# Patient Record
Sex: Female | Born: 1950 | Race: White | Hispanic: No | Smoking: Never smoker
Health system: Southern US, Community
[De-identification: ages and names within clinical notes are randomized; demographics above are authoritative.]

## PROBLEM LIST (undated history)

## (undated) DIAGNOSIS — M1990 Unspecified osteoarthritis, unspecified site: Secondary | ICD-10-CM

## (undated) DIAGNOSIS — I429 Cardiomyopathy, unspecified: Secondary | ICD-10-CM

## (undated) DIAGNOSIS — I5022 Chronic systolic (congestive) heart failure: Secondary | ICD-10-CM

## (undated) DIAGNOSIS — I639 Cerebral infarction, unspecified: Secondary | ICD-10-CM

## (undated) DIAGNOSIS — I10 Essential (primary) hypertension: Secondary | ICD-10-CM

## (undated) DIAGNOSIS — E119 Type 2 diabetes mellitus without complications: Secondary | ICD-10-CM

## (undated) HISTORY — PX: TONSILLECTOMY: SUR1361

## (undated) HISTORY — PX: HIP FRACTURE SURGERY: SHX118

## (undated) HISTORY — PX: PACEMAKER IMPLANT: EP1218

## (undated) HISTORY — PX: OTHER SURGICAL HISTORY: SHX169

---

## 2013-01-17 ENCOUNTER — Emergency Department (HOSPITAL_COMMUNITY)
Admission: EM | Admit: 2013-01-17 | Discharge: 2013-01-18 | Disposition: A | Discharge status: Home / Self Care | Payer: Medicaid Other | Attending: Emergency Medicine | Admitting: Emergency Medicine

## 2013-01-17 ENCOUNTER — Emergency Department (HOSPITAL_COMMUNITY): Payer: Medicaid Other

## 2013-01-17 ENCOUNTER — Encounter (HOSPITAL_COMMUNITY): Payer: Self-pay | Admitting: *Deleted

## 2013-01-17 DIAGNOSIS — I10 Essential (primary) hypertension: Secondary | ICD-10-CM

## 2013-01-17 DIAGNOSIS — E119 Type 2 diabetes mellitus without complications: Secondary | ICD-10-CM

## 2013-01-17 DIAGNOSIS — R339 Retention of urine, unspecified: Secondary | ICD-10-CM | POA: Insufficient documentation

## 2013-01-17 DIAGNOSIS — E1169 Type 2 diabetes mellitus with other specified complication: Secondary | ICD-10-CM | POA: Insufficient documentation

## 2013-01-17 DIAGNOSIS — Z8739 Personal history of other diseases of the musculoskeletal system and connective tissue: Secondary | ICD-10-CM | POA: Insufficient documentation

## 2013-01-17 DIAGNOSIS — R109 Unspecified abdominal pain: Secondary | ICD-10-CM | POA: Insufficient documentation

## 2013-01-17 DIAGNOSIS — R000 Tachycardia, unspecified: Secondary | ICD-10-CM | POA: Insufficient documentation

## 2013-01-17 DIAGNOSIS — R222 Localized swelling, mass and lump, trunk: Secondary | ICD-10-CM | POA: Insufficient documentation

## 2013-01-17 DIAGNOSIS — Z8679 Personal history of other diseases of the circulatory system: Secondary | ICD-10-CM | POA: Insufficient documentation

## 2013-01-17 DIAGNOSIS — R918 Other nonspecific abnormal finding of lung field: Secondary | ICD-10-CM

## 2013-01-17 HISTORY — DX: Type 2 diabetes mellitus without complications: E11.9

## 2013-01-17 HISTORY — DX: Essential (primary) hypertension: I10

## 2013-01-17 HISTORY — DX: Unspecified osteoarthritis, unspecified site: M19.90

## 2013-01-17 HISTORY — DX: Cardiomyopathy, unspecified: I42.9

## 2013-01-17 LAB — CBC WITH DIFFERENTIAL/PLATELET
Basophils Absolute: 0 10*3/uL (ref 0.0–0.1)
Basophils Relative: 0 % (ref 0–1)
Eosinophils Absolute: 0.1 10*3/uL (ref 0.0–0.7)
Eosinophils Relative: 0 % (ref 0–5)
HCT: 38.3 % (ref 36.0–46.0)
Hemoglobin: 13.1 g/dL (ref 12.0–15.0)
Lymphocytes Relative: 9 % — ABNORMAL LOW (ref 12–46)
Lymphs Abs: 1.7 10*3/uL (ref 0.7–4.0)
MCH: 28.4 pg (ref 26.0–34.0)
MCHC: 34.2 g/dL (ref 30.0–36.0)
MCV: 83.1 fL (ref 78.0–100.0)
Monocytes Absolute: 1.3 10*3/uL — ABNORMAL HIGH (ref 0.1–1.0)
Monocytes Relative: 7 % (ref 3–12)
Neutro Abs: 15.7 10*3/uL — ABNORMAL HIGH (ref 1.7–7.7)
Neutrophils Relative %: 84 % — ABNORMAL HIGH (ref 43–77)
Platelets: 444 10*3/uL — ABNORMAL HIGH (ref 150–400)
RBC: 4.61 MIL/uL (ref 3.87–5.11)
RDW: 13 % (ref 11.5–15.5)
WBC: 18.7 10*3/uL — ABNORMAL HIGH (ref 4.0–10.5)

## 2013-01-17 LAB — BASIC METABOLIC PANEL
BUN: 12 mg/dL (ref 6–23)
CO2: 19 mEq/L (ref 19–32)
Calcium: 9.7 mg/dL (ref 8.4–10.5)
Chloride: 98 mEq/L (ref 96–112)
Creatinine, Ser: 0.55 mg/dL (ref 0.50–1.10)
GFR calc Af Amer: >90 mL/min (ref >90)
GFR calc non Af Amer: >90 mL/min (ref >90)
Glucose, Bld: 315 mg/dL — ABNORMAL HIGH (ref 70–99)
Potassium: 3.6 mEq/L (ref 3.5–5.1)
Sodium: 135 mEq/L (ref 135–145)

## 2013-01-17 LAB — URINALYSIS, ROUTINE W REFLEX MICROSCOPIC
Bilirubin Urine: NEGATIVE
Glucose, UA: 500 mg/dL — AB
Ketones, ur: >80 mg/dL — AB
Leukocytes, UA: NEGATIVE
Nitrite: NEGATIVE
Protein, ur: 100 mg/dL — AB
Specific Gravity, Urine: 1.025 (ref 1.005–1.030)
Urobilinogen, UA: 0.2 mg/dL (ref 0.0–1.0)
pH: 5.5 (ref 5.0–8.0)

## 2013-01-17 LAB — URINE MICROSCOPIC-ADD ON

## 2013-01-17 LAB — GLUCOSE, CAPILLARY: Glucose-Capillary: 265 mg/dL — ABNORMAL HIGH (ref 70–99)

## 2013-01-17 IMAGING — CR DG CHEST 2V
2 series · 2 of 2 positions shown · non-contrast
Comparison: None.

CLINICAL DATA: Hypertension and hyperglycemia.  Urinary retention.

CHEST - 2 VIEW

[view not recorded (1 of 2)]
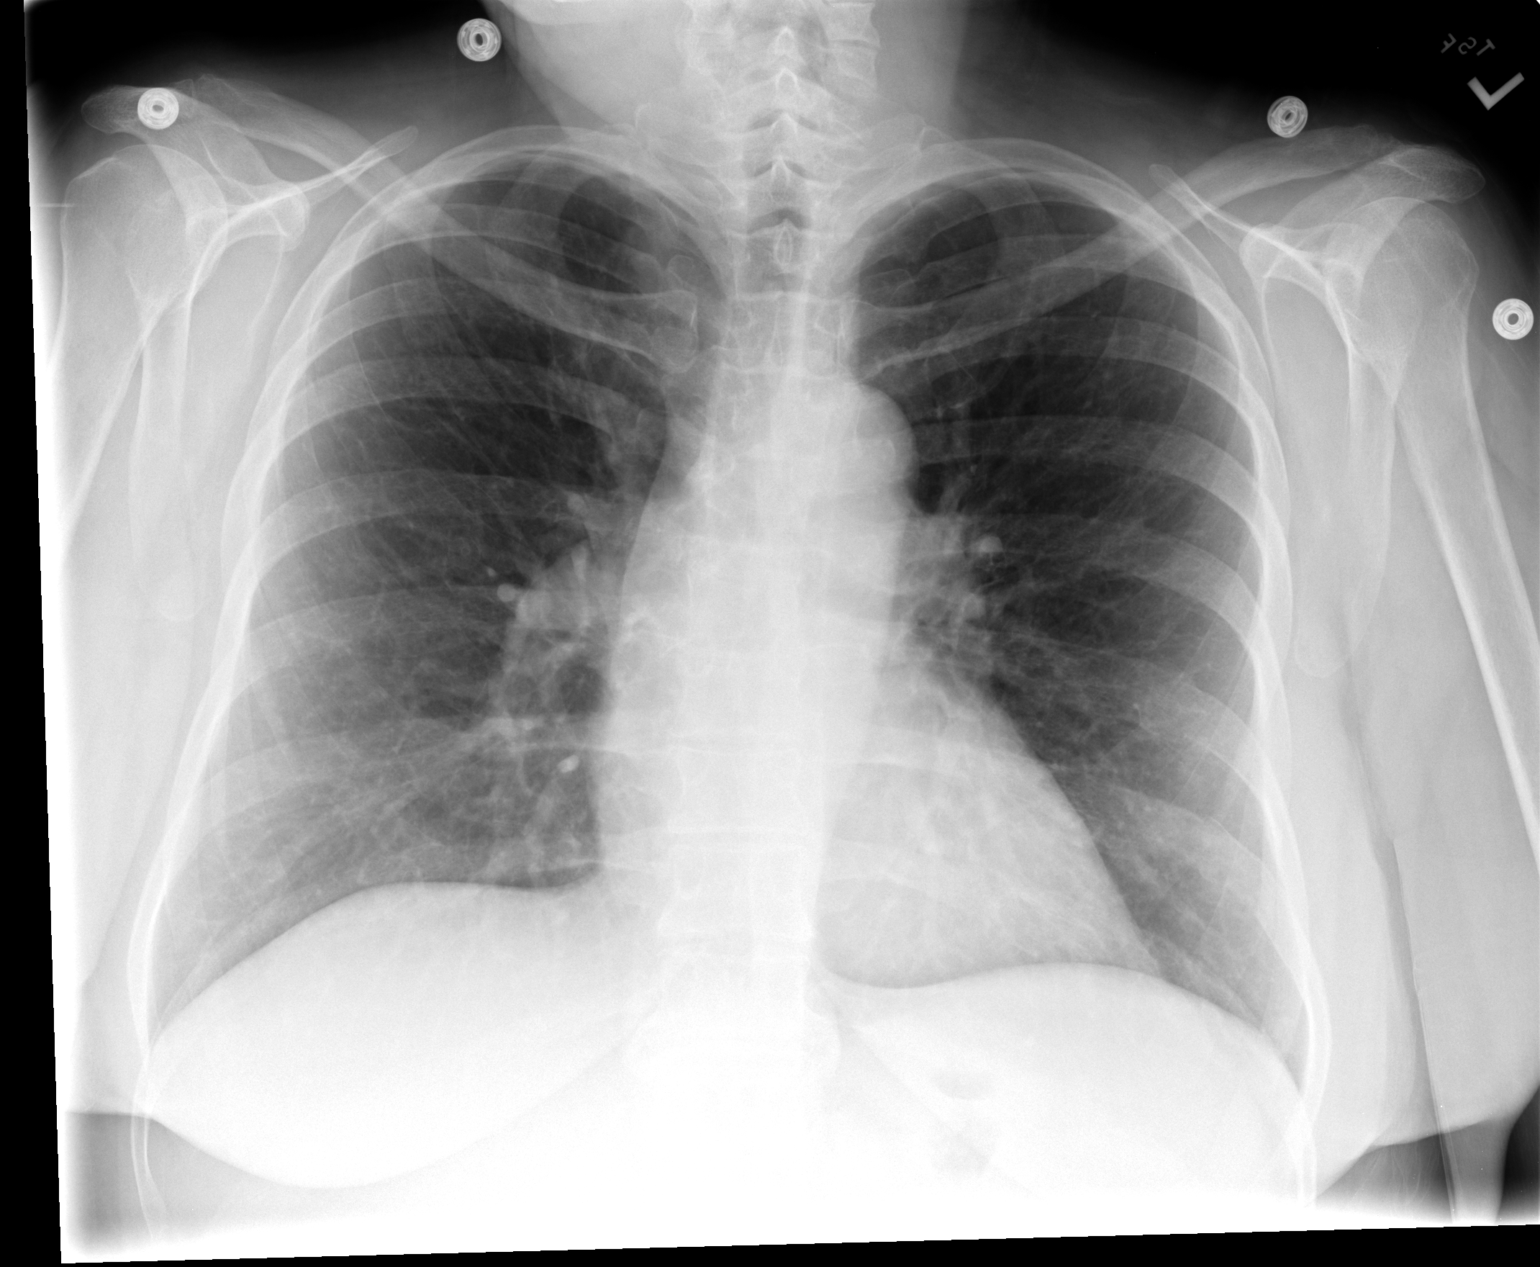

[view not recorded (2 of 2)]
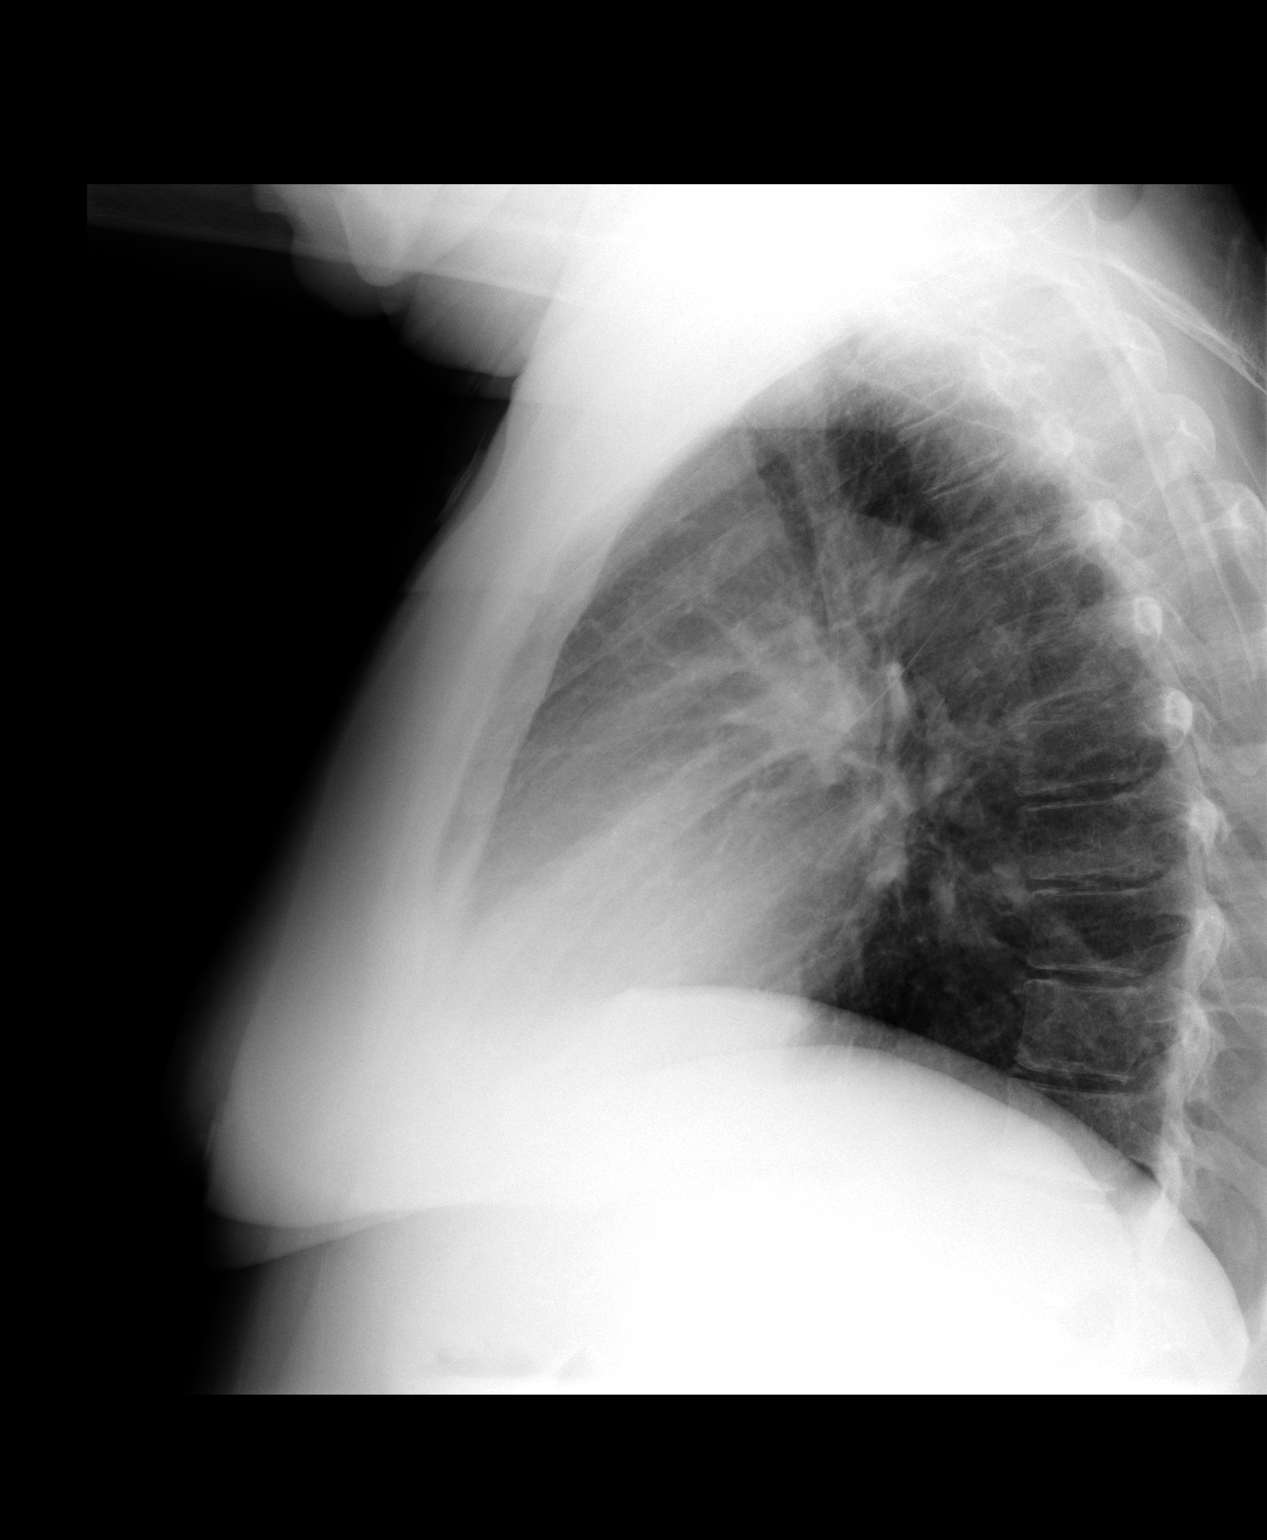

[2 of 2 positions shown; findings below may reference images not displayed]

FINDINGS: The heart size and pulmonary vascularity are normal and
the lungs are clear.  No acute osseous abnormality. Old healed left
posterior rib fractures.
IMPRESSION: No acute disease.

## 2013-01-17 IMAGING — CT CT ABD-PELV W/O CM
2 of 4 series · 15 of 46 positions shown, 17 images · non-contrast
Comparison: None.

CLINICAL DATA: Unable to urinate for 3 days.  Left flank pain.
History of diabetes.

CT ABDOMEN AND PELVIS WITHOUT CONTRAST
TECHNIQUE: Multidetector CT imaging of the abdomen and pelvis was
performed following the standard protocol without intravenous
contrast.

[Series 2: standard/full over (age)lbs 5.0 · axial · 0.73mm/px · z∈[-426,-22]mm · 12 of 97 slices shown, 14 images]
[im 8/97  soft-tissue]
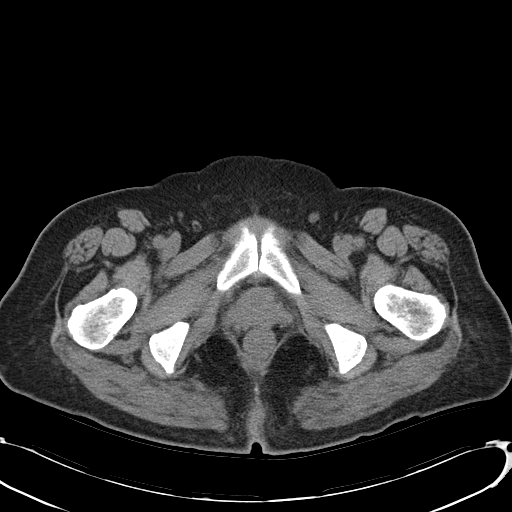
[im 8/97  bone]
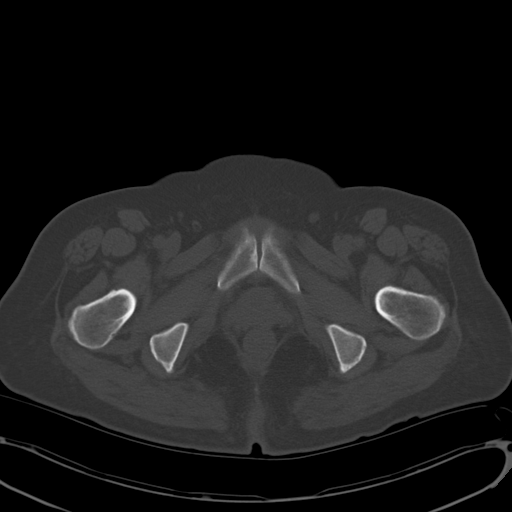
[im 15/97  soft-tissue]
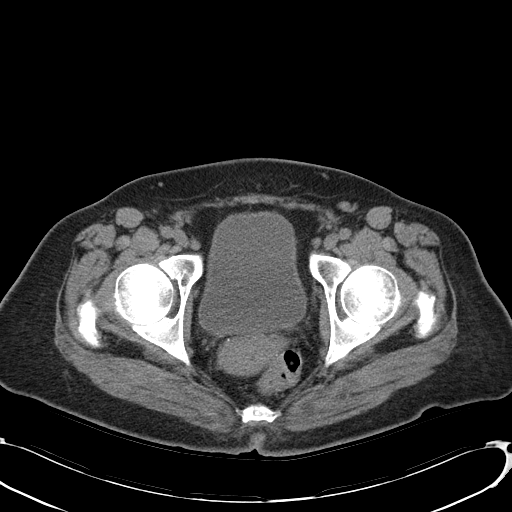
[im 23/97  soft-tissue]
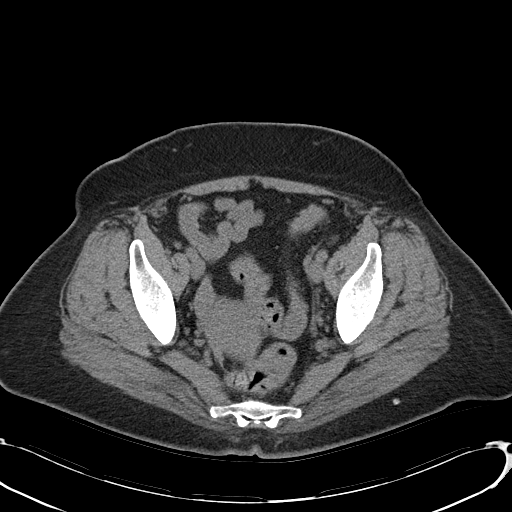
[im 30/97  soft-tissue]
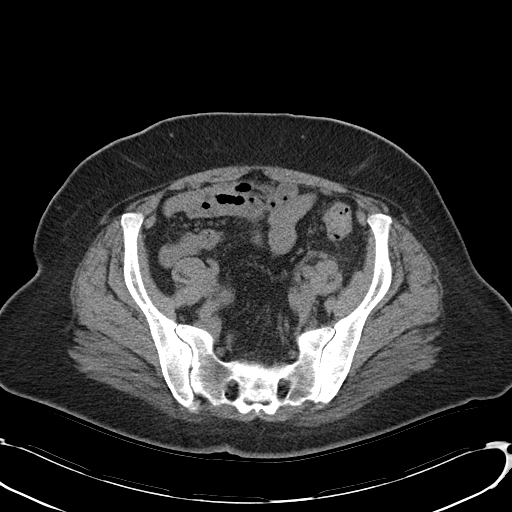
[im 37/97  soft-tissue]
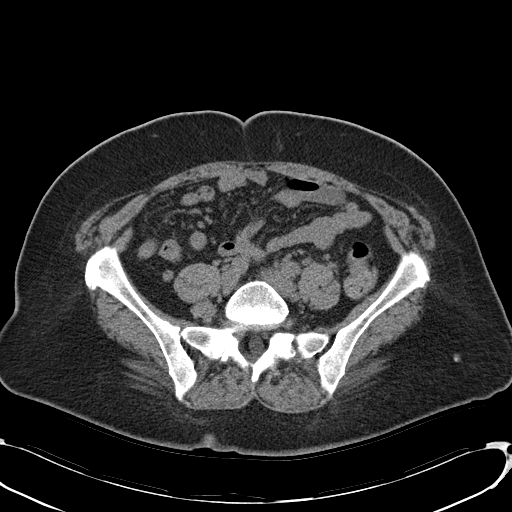
[im 45/97  soft-tissue]
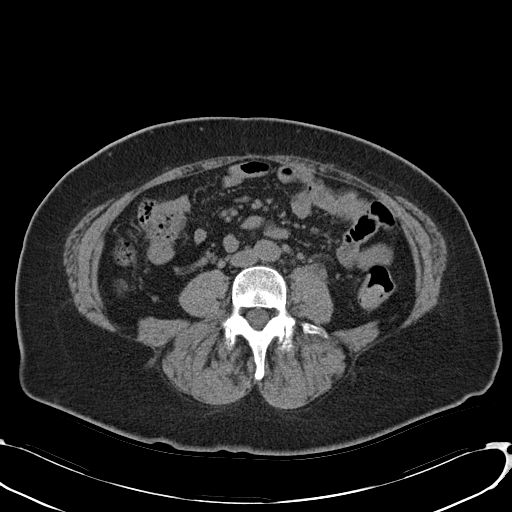
[im 52/97  soft-tissue]
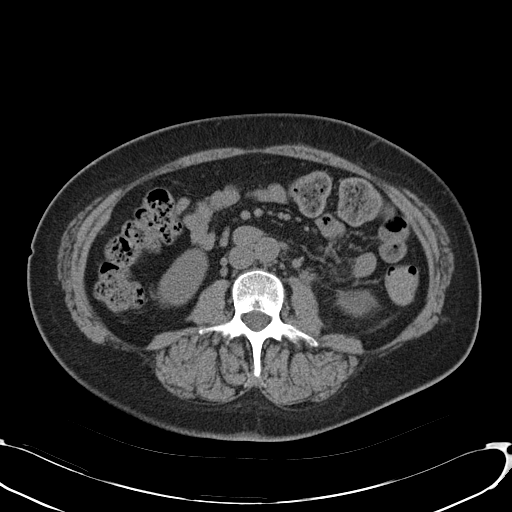
[im 60/97  soft-tissue]
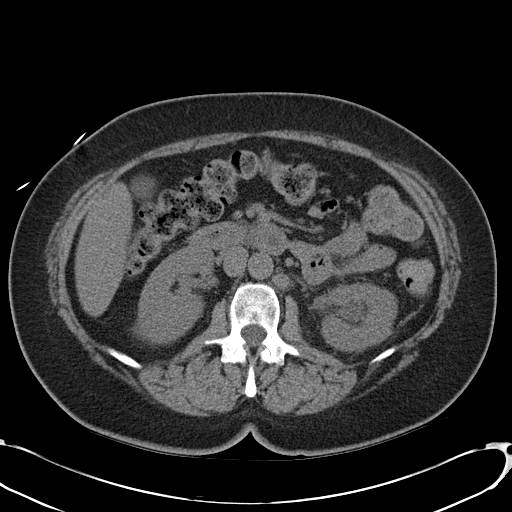
[im 67/97  soft-tissue]
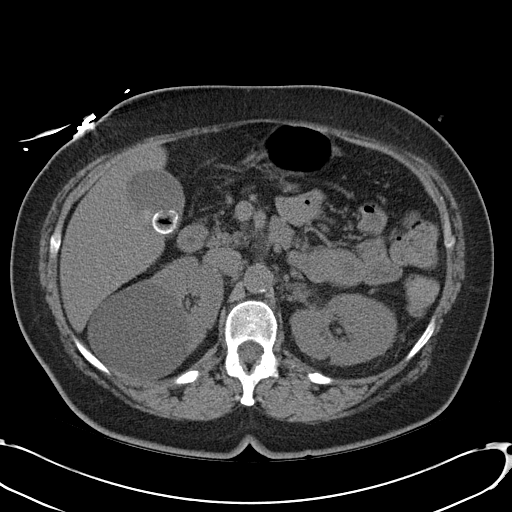
[im 67/97  bone]
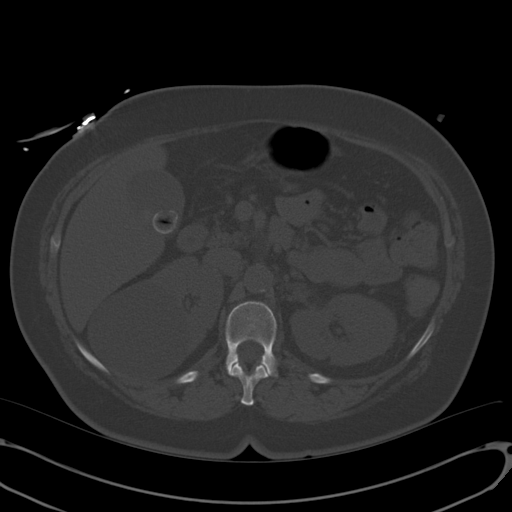
[im 74/97  soft-tissue]
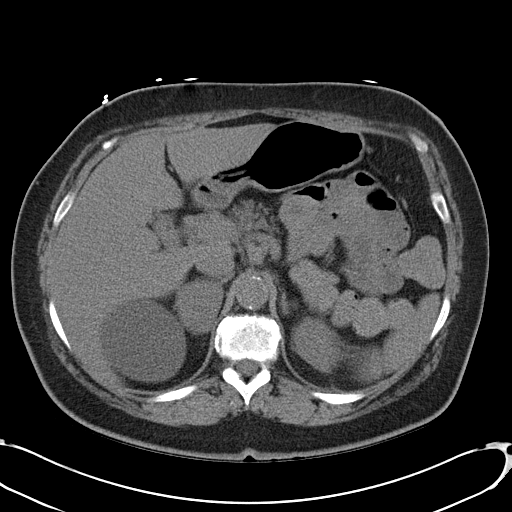
[im 82/97  soft-tissue]
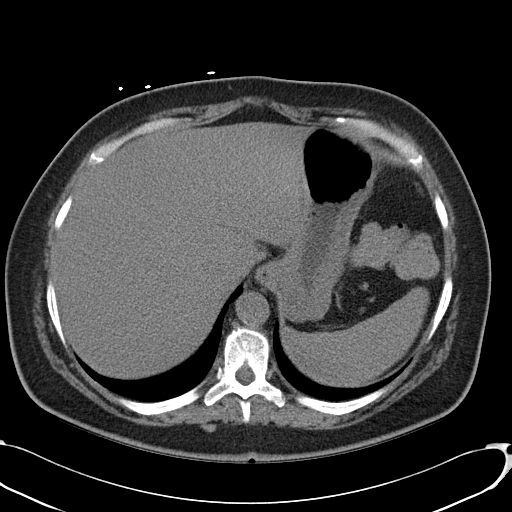
[im 89/97  soft-tissue]
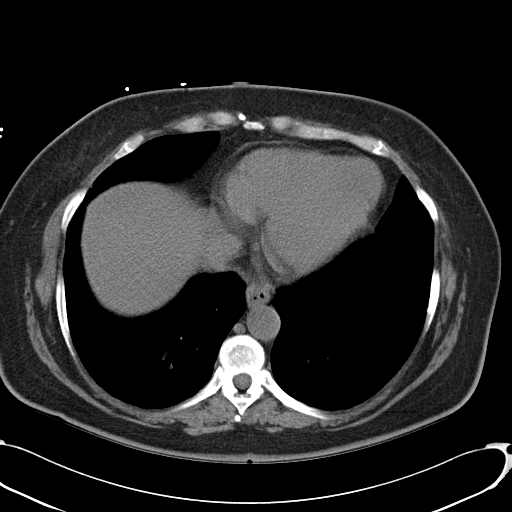

[Series 4: mpr coronal · coronal · 0.72mm/px · 3 of 87 slices shown]
[im 29/87  soft-tissue]
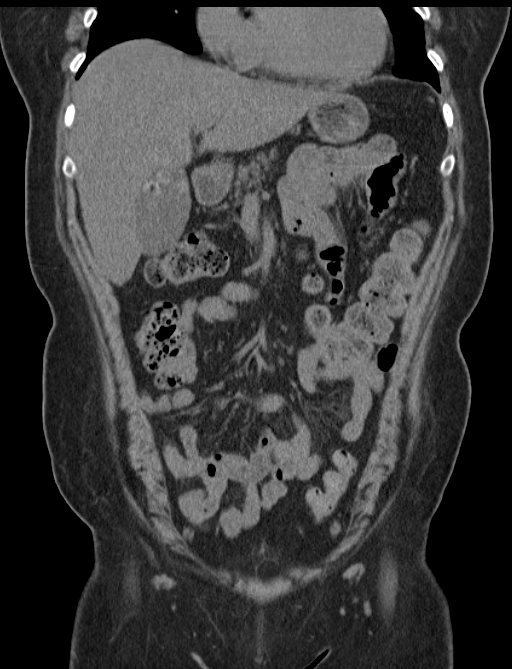
[im 39/87  soft-tissue]
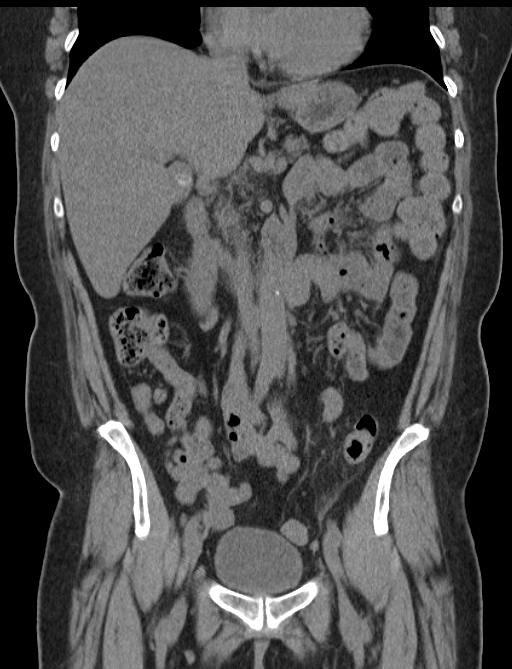
[im 48/87  soft-tissue]
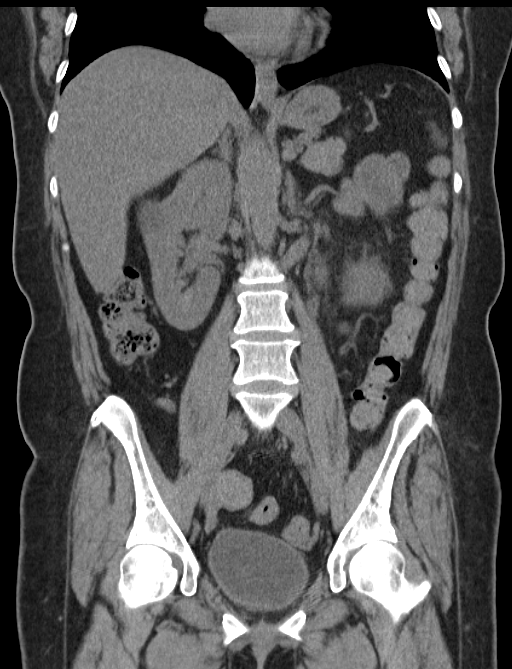

[15 of 46 positions shown; findings below may reference images not displayed]

FINDINGS: A 1.1 cm nodule is noted at the left lung base.

The liver and spleen are unremarkable in appearance.  Multiple
prominent stones are seen within the gallbladder; the gallbladder
is otherwise unremarkable in appearance.  The pancreas and adrenal
glands are unremarkable.

There is minimal apparent left-sided hydronephrosis, involving only
the lower pole of the left kidney; this could conceivably reflect a
small parapelvic cyst.  However, there is asymmetric left-sided
perinephric stranding, and stranding along the course of the left
ureter.  This may reflect a recently passed stone, as no distal
obstructing stone is identified, and no stones are seen within the
bladder.  Alternatively, this could reflect pyelonephritis and
ureteritis.

A large 8.5 cm cyst is noted arising along the lateral aspect of
the right kidney.  The right kidney is otherwise unremarkable in
appearance.  No nonobstructing renal stones are identified on
either side.

No free fluid is identified.  The small bowel is unremarkable in
appearance.  The stomach is within normal limits.  No acute
vascular abnormalities are seen.

The appendix is normal in caliber, without evidence for
appendicitis.  The colon is grossly unremarkable in appearance; a
single diverticulum is incidentally noted along the mid sigmoid
colon.

The bladder is mildly distended and grossly unremarkable in
appearance.  The uterus is within normal limits.  The ovaries are
grossly symmetric; no suspicious adnexal masses are seen.  No
inguinal lymphadenopathy is seen.

No acute osseous abnormalities are identified.  Healed left-sided
rib fractures are seen.
IMPRESSION: 1.  Minimal apparent left-sided hydronephrosis, involving only the
lower pole of the left kidney; this could reflect a small
parapelvic cyst.  However, there is asymmetric left-sided
perinephric stranding, and stranding along the course of the left
ureter.  This may reflect a recently passed stone, or possibly
pyelonephritis and ureteritis.  No distal obstructing stones are
identified.
2.  Large 8.5 cm right renal cyst seen.
3.  Cholelithiasis noted; gallbladder otherwise unremarkable in
appearance.
4.  1.1 cm nodule at the left lung base; malignancy cannot be
excluded.  PET / CT would be helpful for further evaluation.

## 2013-01-17 IMAGING — CT CT CHEST W/ CM
2 of 3 series · 15 of 36 positions shown, 18 images · IV contrast (Omnipaque 300)
Comparison: Abdominal CT [DATE]

CLINICAL DATA: Evaluate left lung nodule.

CT CHEST WITH CONTRAST
TECHNIQUE: Multidetector CT imaging of the chest was performed
following the standard protocol during bolus administration of
intravenous contrast.
Contrast: 80mL OMNIPAQUE IOHEXOL 300 MG/ML  SOLN

[Series 2: chestroutine 5.0 b40f · axial · 0.69mm/px · z∈[-264,-34]mm · 12 of 55 slices shown, 15 images]
[im 5/55  mediastinal]
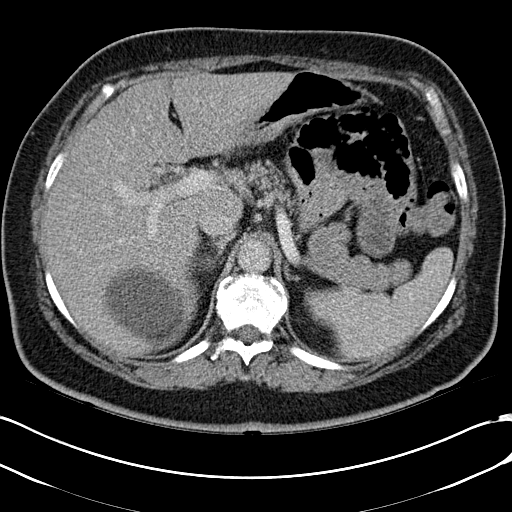
[im 5/55  lung]
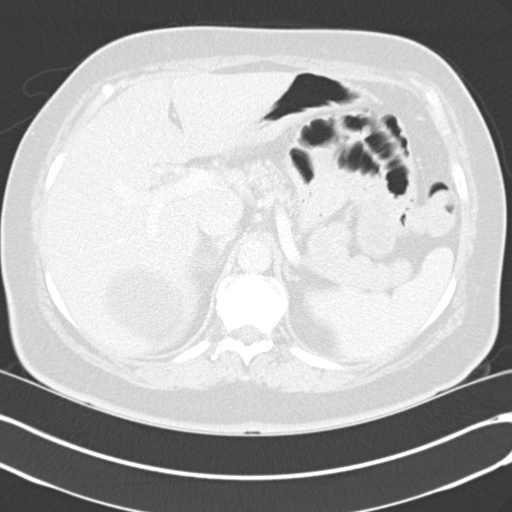
[im 9/55  lung]
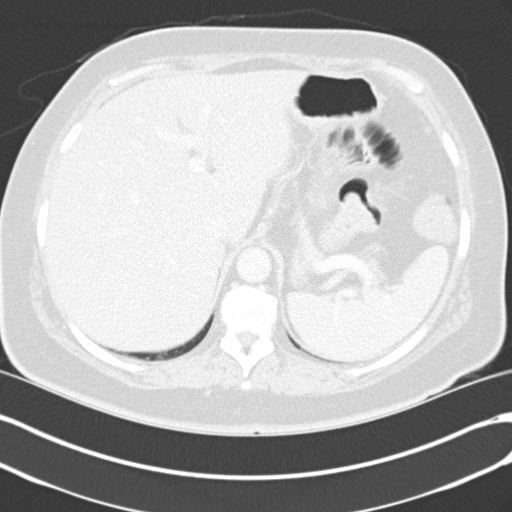
[im 13/55  lung]
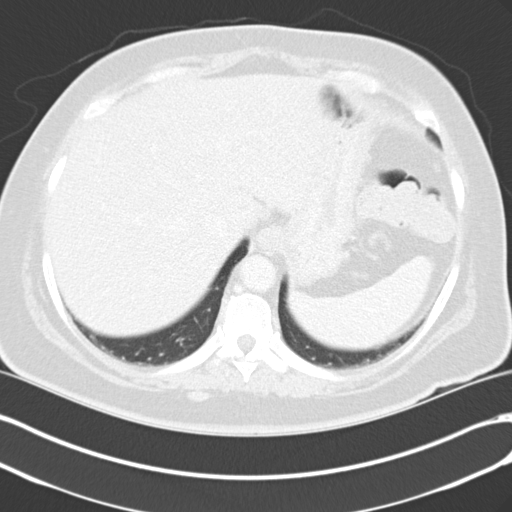
[im 17/55  lung]
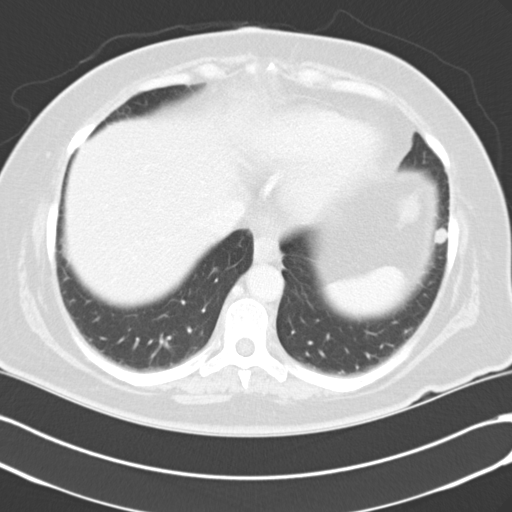
[im 21/55  mediastinal]
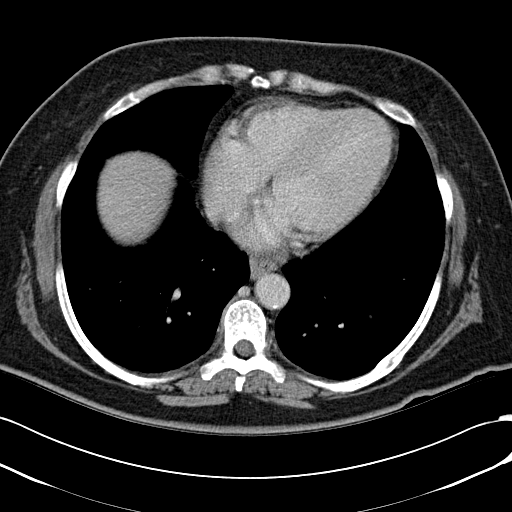
[im 21/55  lung]
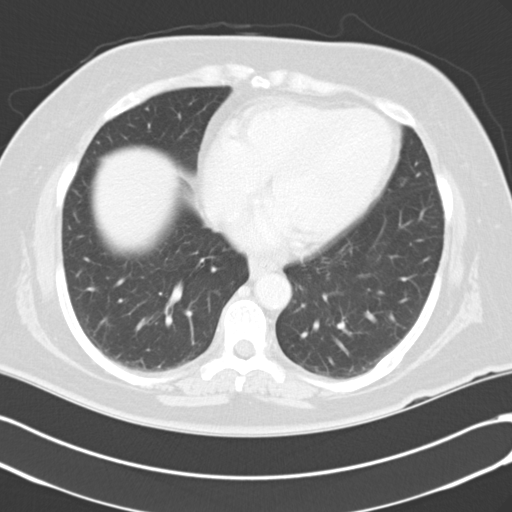
[im 25/55  lung]
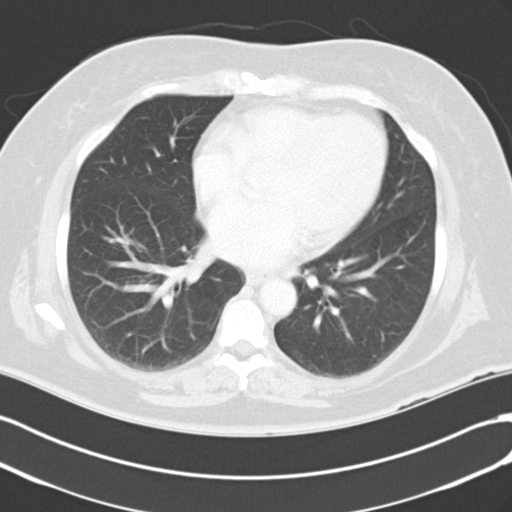
[im 31/55  lung]
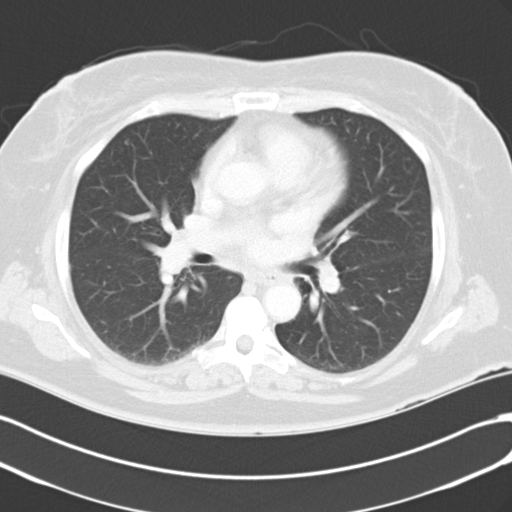
[im 35/55  lung]
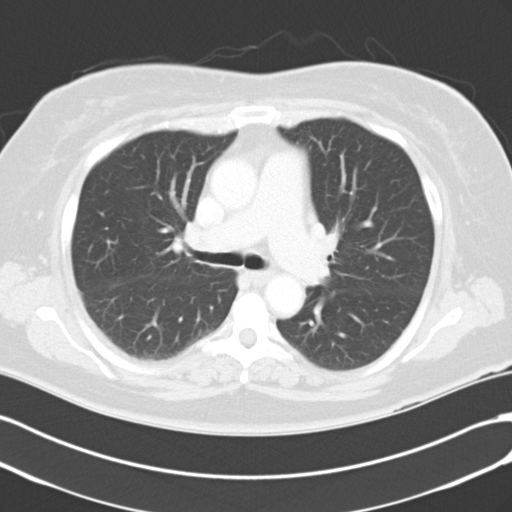
[im 39/55  mediastinal]
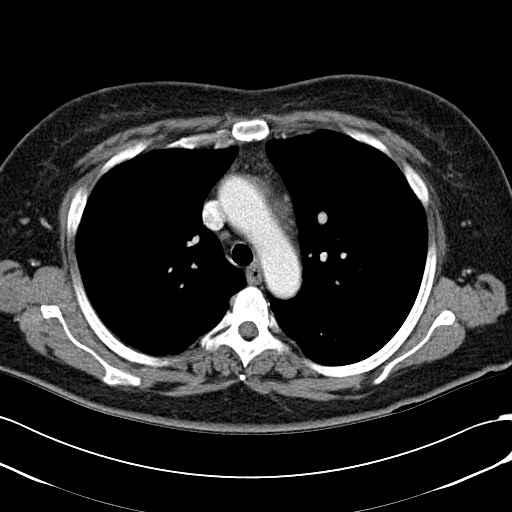
[im 39/55  lung]
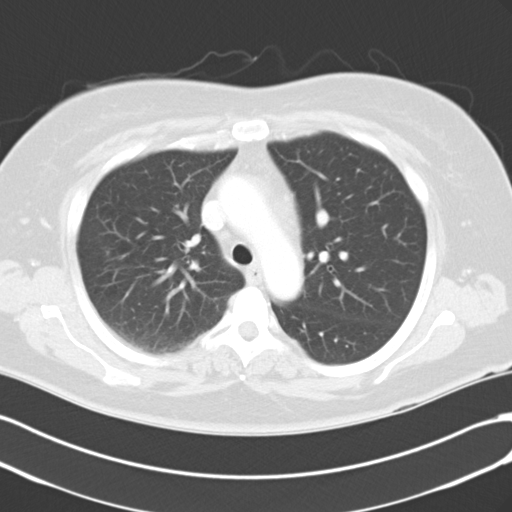
[im 43/55  lung]
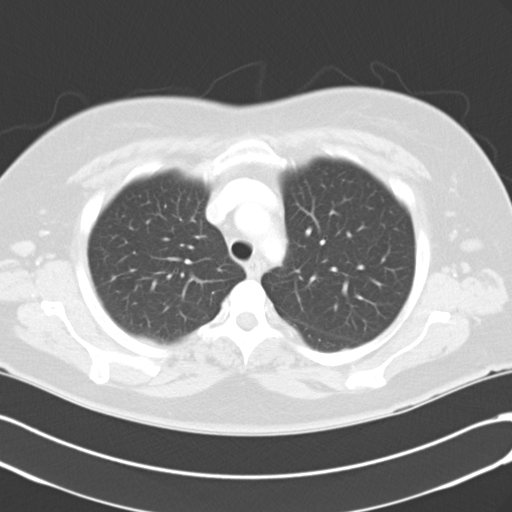
[im 47/55  lung]
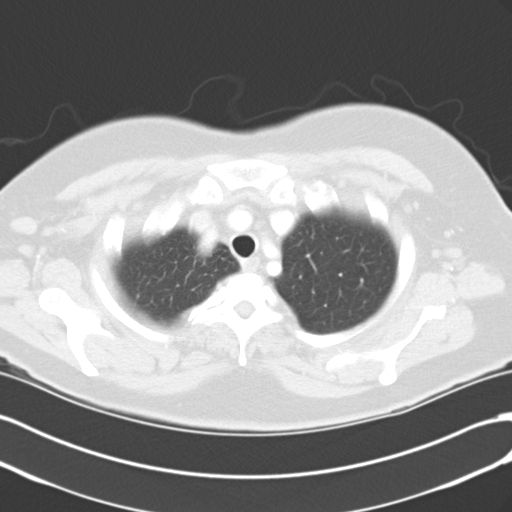
[im 51/55  lung]
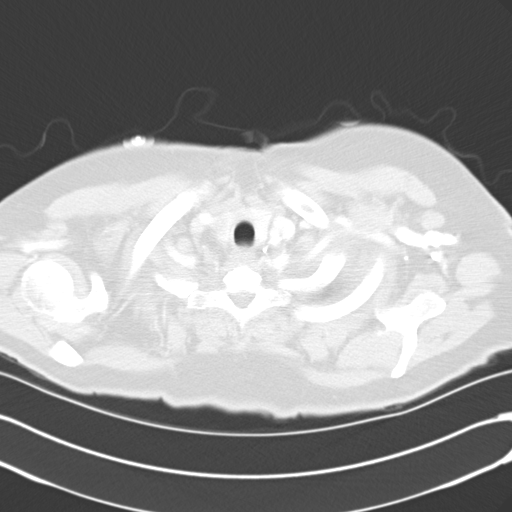

[Series 6: mpr coronal chest 3mm · coronal · 0.55mm/px · 3 of 91 slices shown]
[im 19/91  lung]
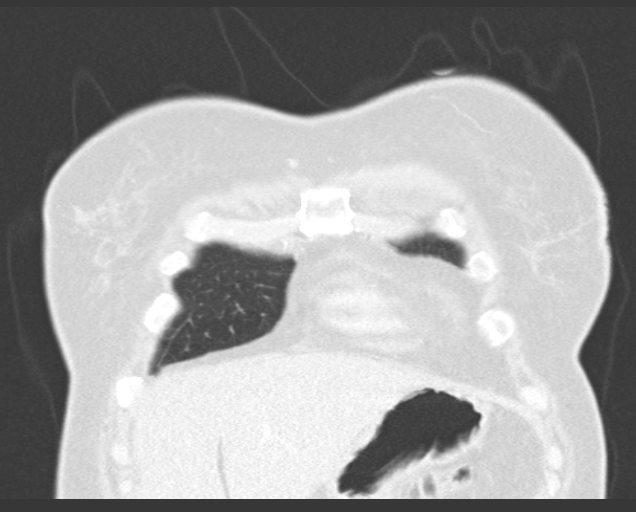
[im 37/91  lung]
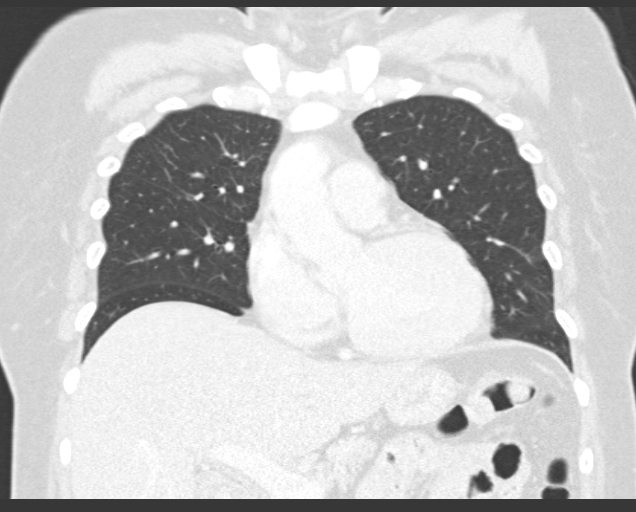
[im 55/91  lung]
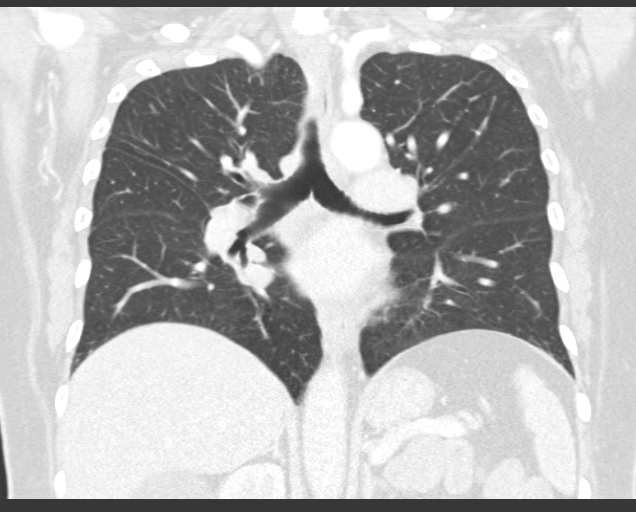

[15 of 36 positions shown; findings below may reference images not displayed]

FINDINGS: There are small lymph nodes in the sub pectoralis region
bilaterally.  There is also prominent lymph nodes in the axilla
bilaterally.  Index lesion on the right side measures 1.7 cm on
sequence 2, image 9.  No significant mediastinal lymphadenopathy
but there may be mild bilateral hilar lymphadenopathy.  Question a
right hilar lymph node, measuring 1 cm in the short axis, on image
27.  No significant pericardial or pleural fluid.

There are gallstones.  Again noted is a large right renal cyst.

The trachea and the mainstem bronchi are patent.  There is a 1.1 cm
noncalcified nodule at the left lung base.  There is a 5 mm nodule
in the superior segment of the left lower lobe on sequence 3, image
15.  There are two adjacent nodules in the left upper lobe on image
16, largest measures 5 mm.  Pleural-based nodule along the right
major fissure, measures approximately 5 mm.  Small nodule in the
right middle lobe on image 25.  Small pleural-based nodule at the
right lung base on image 37.  There are old left posterior eighth
and ninth rib fractures.
IMPRESSION: There are bilateral lung nodules.  The largest is a 1.1 cm nodule
at the left lung base.

Prominent bilateral axillary lymph nodes and lymph nodes within the
sub pectoralis region.  There is also concern for mild hilar
lymphadenopathy bilaterally.  These findings are nonspecific but a
neoplastic process cannot be excluded.

Recommend further evaluation with a PET-CT.  If multidisciplinary
follow up management is desired, this is available in the [REDACTED] through the [REDACTED] [DATE]-
[2Q].

Cholelithiasis.

Right kidney cyst.

These results were called by telephone on [DATE] at [DATE] p.m.
to Dr. YAMIN, who verbally acknowledged these results.

## 2013-01-17 MED ORDER — CIPROFLOXACIN HCL 500 MG PO TABS: 500.0000 mg | ORAL_TABLET | Freq: Two times a day (BID) | ORAL | Status: DC

## 2013-01-17 MED ORDER — LISINOPRIL-HYDROCHLOROTHIAZIDE 20-12.5 MG PO TABS: 1.0000 | ORAL_TABLET | Freq: Every day | ORAL | Status: DC

## 2013-01-17 MED ORDER — SODIUM CHLORIDE 0.9 % IV BOLUS (SEPSIS)
1000.0000 mL | Freq: Once | INTRAVENOUS | Status: AC
  Administered 2013-01-17: 1000 mL via INTRAVENOUS

## 2013-01-17 MED ORDER — METFORMIN HCL 500 MG PO TABS: 500.0000 mg | ORAL_TABLET | Freq: Two times a day (BID) | ORAL | Status: DC

## 2013-01-17 MED ORDER — CLONIDINE HCL 0.2 MG PO TABS
0.2000 mg | ORAL_TABLET | Freq: Once | ORAL | Status: AC
  Administered 2013-01-17: 0.2 mg via ORAL
  Filled 2013-01-17: qty 1

## 2013-01-17 MED ORDER — IOHEXOL 300 MG/ML  SOLN
80.0000 mL | Freq: Once | INTRAMUSCULAR | Status: AC | PRN
  Administered 2013-01-17: 80 mL via INTRAVENOUS

## 2013-01-17 MED ORDER — DEXTROSE 5 % IV SOLN
1.0000 g | Freq: Once | INTRAVENOUS | Status: AC
  Administered 2013-01-17: 1 g via INTRAVENOUS
  Filled 2013-01-17: qty 10

## 2013-01-17 NOTE — ED Notes (Signed)
Patient transported to CT 

## 2013-01-17 NOTE — ED Notes (Signed)
PT sent from [place] Dept. For HTN of 205/121 and CBG reading "high". Pt states lower back pain and went five days without urinating. Pt states "only dribbling urine now"

## 2013-01-17 NOTE — ED Provider Notes (Signed)
History  This chart was scribed for Nat Christen, MD by Roe Coombs, ED Scribe. The patient was seen in room APA14/AP[facility]14. Patient's care was started at 1843.   CSN: [id number]  Arrival date & time 01/17/13  [id number]   First MD Initiated Contact with Patient 01/17/13 1843      Chief Complaint  Patient presents with  . Hypertension  . Hyperglycemia  . Urinary Retention    The history is provided by the patient. No language interpreter was used.    HPI Comments: Tracey Bryant is a 62 y.o. female with history of diabetes and hypertension who presents to the Emergency Department complaining of urinary retention and constant, non-radiating, moderate left flank pain onset 1 week ago. Patient says that she has "dribbled," but she has not had a normal urine void since for the past week. She states that over the past two days, the flank pain has become gradually more severe. She also states that both her blood pressure and blood sugar have been high. Patient was sent here from [facility] Department with BP measurement of 205/121. Patient hasn't taken bp or diabetes medications for the past several years due to lack of income. Patient is unemployed. She states that her sister has tried to help out with medical costs, but she is also unemployed.   Past Medical History  Diagnosis Date  . Diabetes mellitus without complication   . Hypertension   . Arthritis   . Cardiomyopathy     Past Surgical History  Procedure Date  . Breast tumor     No family history on file.  History  Substance Use Topics  . Smoking status: Never Smoker   . Smokeless tobacco: Not on file  . Alcohol Use: No    OB History    Grav Para Term Preterm Abortions TAB SAB Ect Mult Living                  Review of Systems A complete 10 system review of systems was obtained and all systems are negative except as noted in the HPI and PMH.   Allergies  Review of patient's allergies indicates no known  allergies.  Home Medications  No current outpatient prescriptions on file.  Triage Vitals: BP 226/118  Pulse 129  Temp 98.1 F (36.7 C) (Oral)  Resp 20  Ht 5' 5.75" (1.67 m)  Wt 169 lb (76.658 kg)  BMI 27.49 kg/m2  SpO2 99%  Physical Exam  Nursing note and vitals reviewed. Constitutional: She is oriented to person, place, and time. She appears well-developed and well-nourished.       Appears dehydrated. Hypertensive.  HENT:  Head: Normocephalic and atraumatic.  Eyes: Conjunctivae normal and EOM are normal. Pupils are equal, round, and reactive to light.  Neck: Normal range of motion. Neck supple.  Cardiovascular: Regular rhythm and normal heart sounds.  Tachycardia present.   Pulmonary/Chest: Effort normal and breath sounds normal.  Abdominal: Soft. Bowel sounds are normal.  Musculoskeletal: Normal range of motion.  Neurological: She is alert and oriented to person, place, and time.  Skin: Skin is warm and dry.  Psychiatric: She has a normal mood and affect.    ED Course  Procedures (including critical care time) DIAGNOSTIC STUDIES: Oxygen Saturation is 99% on room air, normal by my interpretation.    COORDINATION OF CARE: 7:04 PM- Will give IV fluids and clonidine. Will order chest x-ray, CT of abdomen and other labs. Patient informed of current plan for  treatment and evaluation and agrees with plan at this time.   Results for orders placed during the hospital encounter of 01/17/13  GLUCOSE, CAPILLARY      Component Value Range   Glucose-Capillary 265 (*) 70 - 99 mg/dL  CBC WITH DIFFERENTIAL      Component Value Range   WBC 18.7 (*) 4.0 - 10.5 K/uL   RBC 4.61  3.87 - 5.11 MIL/uL   Hemoglobin 13.1  12.0 - 15.0 g/dL   HCT 38.3  36.0 - 46.0 %   MCV 83.1  78.0 - 100.0 fL   MCH 28.4  26.0 - 34.0 pg   MCHC 34.2  30.0 - 36.0 g/dL   RDW 13.0  11.5 - 15.5 %   Platelets 444 (*) 150 - 400 K/uL   Neutrophils Relative 84 (*) 43 - 77 %   Neutro Abs 15.7 (*) 1.7 - 7.7 K/uL     Lymphocytes Relative 9 (*) 12 - 46 %   Lymphs Abs 1.7  0.7 - 4.0 K/uL   Monocytes Relative 7  3 - 12 %   Monocytes Absolute 1.3 (*) 0.1 - 1.0 K/uL   Eosinophils Relative 0  0 - 5 %   Eosinophils Absolute 0.1  0.0 - 0.7 K/uL   Basophils Relative 0  0 - 1 %   Basophils Absolute 0.0  0.0 - 0.1 K/uL  BASIC METABOLIC PANEL      Component Value Range   Sodium 135  135 - 145 mEq/L   Potassium 3.6  3.5 - 5.1 mEq/L   Chloride 98  96 - 112 mEq/L   CO2 19  19 - 32 mEq/L   Glucose, Bld 315 (*) 70 - 99 mg/dL   BUN 12  6 - 23 mg/dL   Creatinine, Ser 0.55  0.50 - 1.10 mg/dL   Calcium 9.7  8.4 - 10.5 mg/dL   GFR calc non Af Amer >90  >90 mL/min   GFR calc Af Amer >90  >90 mL/min  URINALYSIS, ROUTINE W REFLEX MICROSCOPIC      Component Value Range   Color, Urine YELLOW  YELLOW   APPearance CLEAR  CLEAR   Specific Gravity, Urine 1.025  1.005 - 1.030   pH 5.5  5.0 - 8.0   Glucose, UA 500 (*) NEGATIVE mg/dL   Hgb urine dipstick MODERATE (*) NEGATIVE   Bilirubin Urine NEGATIVE  NEGATIVE   Ketones, ur >80 (*) NEGATIVE mg/dL   Protein, ur 100 (*) NEGATIVE mg/dL   Urobilinogen, UA 0.2  0.0 - 1.0 mg/dL   Nitrite NEGATIVE  NEGATIVE   Leukocytes, UA NEGATIVE  NEGATIVE  URINE MICROSCOPIC-ADD ON      Component Value Range   WBC, UA 11-20  <3 WBC/hpf   RBC / HPF 11-20  <3 RBC/hpf   Bacteria, UA FEW (*) RARE     Ct Abdomen Pelvis Wo Contrast  01/17/2013  *RADIOLOGY REPORT*  Clinical Data: Unable to urinate for 3 days.  Left flank pain. History of diabetes.  CT ABDOMEN AND PELVIS WITHOUT CONTRAST  Technique:  Multidetector CT imaging of the abdomen and pelvis was performed following the standard protocol without intravenous contrast.  Comparison: None.  Findings: A 1.1 cm nodule is noted at the left lung base.  The liver and spleen are unremarkable in appearance.  Multiple prominent stones are seen within the gallbladder; the gallbladder is otherwise unremarkable in appearance.  The pancreas and  adrenal glands are unremarkable.  There is minimal apparent left-sided  hydronephrosis, involving only the lower pole of the left kidney; this could conceivably reflect a small parapelvic cyst.  However, there is asymmetric left-sided perinephric stranding, and stranding along the course of the left ureter.  This may reflect a recently passed stone, as no distal obstructing stone is identified, and no stones are seen within the bladder.  Alternatively, this could reflect pyelonephritis and ureteritis.  A large 8.5 cm cyst is noted arising along the lateral aspect of the right kidney.  The right kidney is otherwise unremarkable in appearance.  No nonobstructing renal stones are identified on either side.  No free fluid is identified.  The small bowel is unremarkable in appearance.  The stomach is within normal limits.  No acute vascular abnormalities are seen.  The appendix is normal in caliber, without evidence for appendicitis.  The colon is grossly unremarkable in appearance; a single diverticulum is incidentally noted along the mid sigmoid colon.  The bladder is mildly distended and grossly unremarkable in appearance.  The uterus is within normal limits.  The ovaries are grossly symmetric; no suspicious adnexal masses are seen.  No inguinal lymphadenopathy is seen.  No acute osseous abnormalities are identified.  Healed left-sided rib fractures are seen.  IMPRESSION:  1.  Minimal apparent left-sided hydronephrosis, involving only the lower pole of the left kidney; this could reflect a small parapelvic cyst.  However, there is asymmetric left-sided perinephric stranding, and stranding along the course of the left ureter.  This may reflect a recently passed stone, or possibly pyelonephritis and ureteritis.  No distal obstructing stones are identified. 2.  Large 8.5 cm right renal cyst seen. 3.  Cholelithiasis noted; gallbladder otherwise unremarkable in appearance. 4.  1.1 cm nodule at the left lung base; malignancy  cannot be excluded.  PET / CT would be helpful for further evaluation.   Original Report Authenticated By: Santa Lighter, M.D.    Dg Chest 2 View  01/17/2013  *RADIOLOGY REPORT*  Clinical Data: Hypertension and hyperglycemia.  Urinary retention.  CHEST - 2 VIEW  Comparison: None.  Findings: The heart size and pulmonary vascularity are normal and the lungs are clear.  No acute osseous abnormality. Old healed left posterior rib fractures.  IMPRESSION: No acute disease.   Original Report Authenticated By: Lorriane Shire, M.D.      No diagnosis found. CRITICAL CARE Performed by: Nat Christen  ?  Total critical care time: 30  Critical care time was exclusive of separately billable procedures and treating other patients.  Critical care was necessary to treat or prevent imminent or life-threatening deterioration.  Critical care was time spent personally by me on the following activities: development of treatment plan with patient and/or surrogate as well as nursing, discussions with consultants, evaluation of patient's response to treatment, examination of patient, obtaining history from patient or surrogate, ordering and performing treatments and interventions, ordering and review of laboratory studies, ordering and review of radiographic studies, pulse oximetry and re-evaluation of patient's condition.   MDM  Patient feeling much better after IV fluids, IV antibiotics.   We'll send home with  prescriptions for metformin 500 mg twice a day #60 with 1 refill, lisinopril HCT 20/12.5 one daily #30 with one refill, and Cipro 500 mg twice a day for one week.  CT chest is worrisome for a nodule and adenopathy. Phone number given for the thoracic clinic in [facility] system. Patient will call tomorrow for followup. She understands if she gets any roadblocks that the staff at [facility]  will help make the appointment.  Patient also understands this could signify cancer and that followup is  mandatory      I personally performed the services described in this documentation, which was scribed in my presence. The recorded information has been reviewed and is accurate.    Nat Christen, MD 01/17/13 [phone]

## 2013-01-18 MED ORDER — CLONIDINE HCL 0.1 MG PO TABS
ORAL_TABLET | ORAL | Status: AC
  Administered 2013-01-18: 0.1 mg
  Filled 2013-01-18: qty 1

## 2013-01-18 MED ORDER — CLONIDINE HCL 0.1 MG/24HR TD PTWK: 0.1000 mg | MEDICATED_PATCH | Freq: Once | TRANSDERMAL | Status: DC

## 2013-01-20 LAB — URINE CULTURE: Colony Count: >=100000

## 2013-01-21 NOTE — ED Notes (Signed)
+  Urine. Patient treated with Cipro. Sensitive to same. Per protocol MD. °

## 2013-01-31 ENCOUNTER — Other Ambulatory Visit (HOSPITAL_COMMUNITY): Payer: Self-pay | Admitting: Family Medicine

## 2013-01-31 DIAGNOSIS — R911 Solitary pulmonary nodule: Secondary | ICD-10-CM

## 2013-02-06 ENCOUNTER — Encounter (HOSPITAL_COMMUNITY)
Admission: RE | Admit: 2013-02-06 | Discharge: 2013-02-06 | Disposition: A | Discharge status: Home / Self Care | Payer: Medicaid Other | Source: Ambulatory Visit | Attending: Family Medicine | Admitting: Family Medicine

## 2013-02-06 DIAGNOSIS — R911 Solitary pulmonary nodule: Secondary | ICD-10-CM | POA: Insufficient documentation

## 2013-02-06 LAB — GLUCOSE, CAPILLARY: Glucose-Capillary: 327 mg/dL — ABNORMAL HIGH (ref 70–99)

## 2013-02-06 IMAGING — CT NM PET TUM IMG INITIAL (PI) SKULL BASE T - THIGH
6 series · 25 of 25 positions shown · IV contrast (350 OM)
Comparison: CT chest abdomen pelvis dated [DATE]

CLINICAL DATA: Initial treatment strategy for left lower lobe lung
nodule.

NUCLEAR MEDICINE PET SKULL BASE TO THIGH
Fasting Blood Glucose:  327
TECHNIQUE: 12.4 mCi F-18 FDG was injected intravenously. CT data
was obtained and used for attenuation correction and anatomic
localization only.  (This was not acquired as a diagnostic CT
examination.) Additional exam technical data entered on
technologist worksheet.

[Series 1: pet ac · axial · 3.3mm · 4.69mm/px · z∈[-872,-2]mm · 5 of 267 slices shown]
[im 1/267]
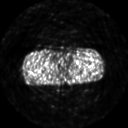
[im 67/267]
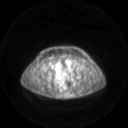
[im 134/267]
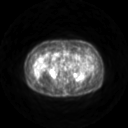
[im 200/267]
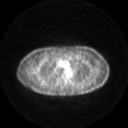
[im 267/267]
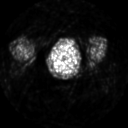

[Series 2: pet nac · axial · 3.3mm · 4.69mm/px · z∈[-872,-2]mm · 6 of 267 slices shown]
[im 1/267]
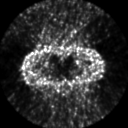
[im 54/267]
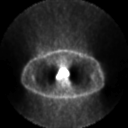
[im 107/267]
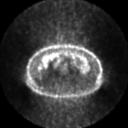
[im 160/267]
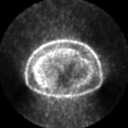
[im 213/267]
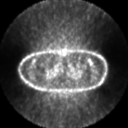
[im 267/267]
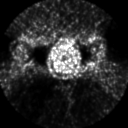

[Series 2: ct images · axial · 3.8mm · 0.98mm/px · z∈[-872,-2]mm · 6 of 267 slices shown]
[im 1/267]
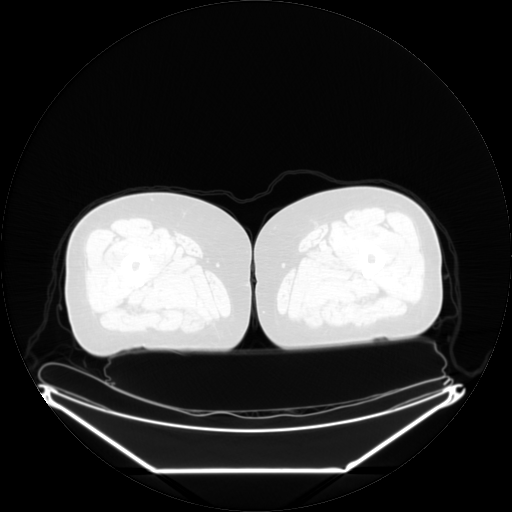
[im 54/267]
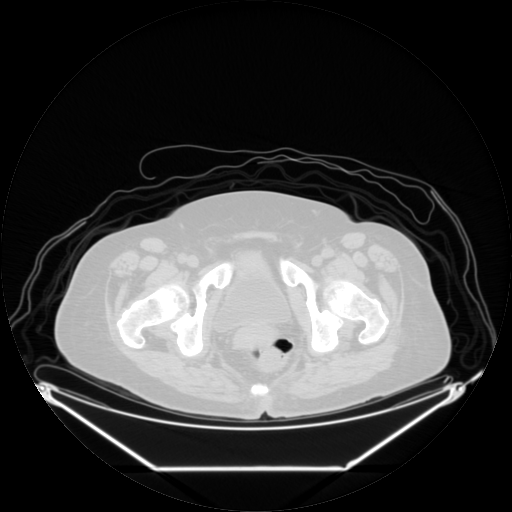
[im 107/267]
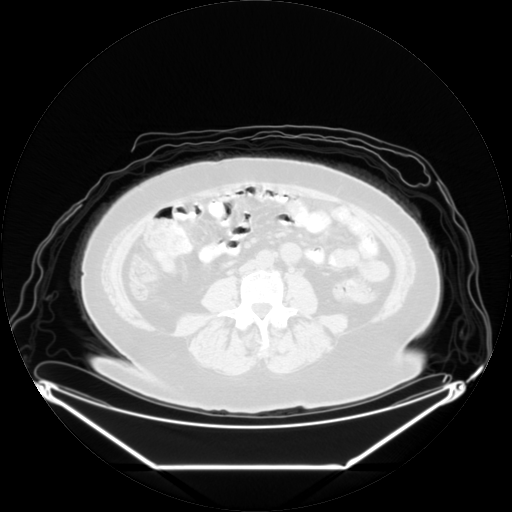
[im 160/267]
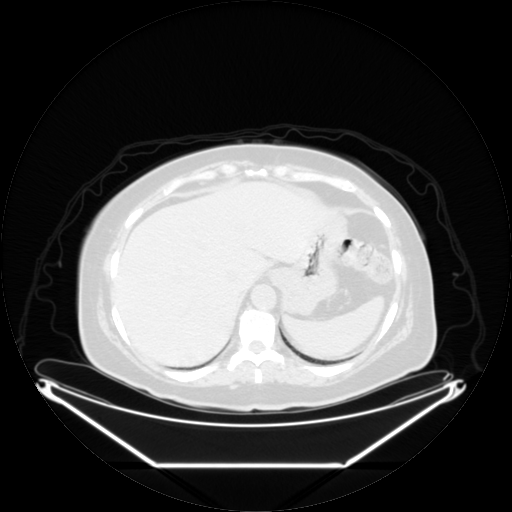
[im 213/267]
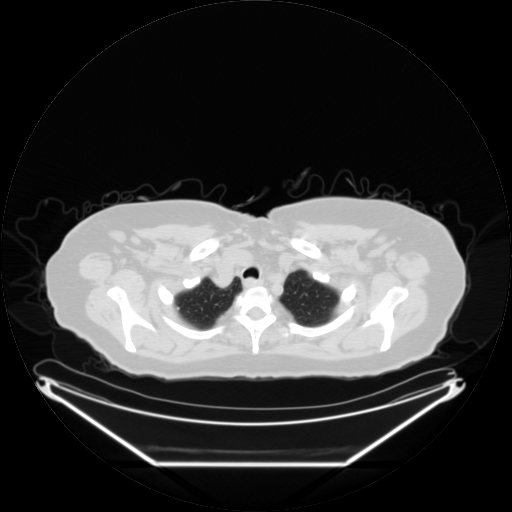
[im 267/267  brain]
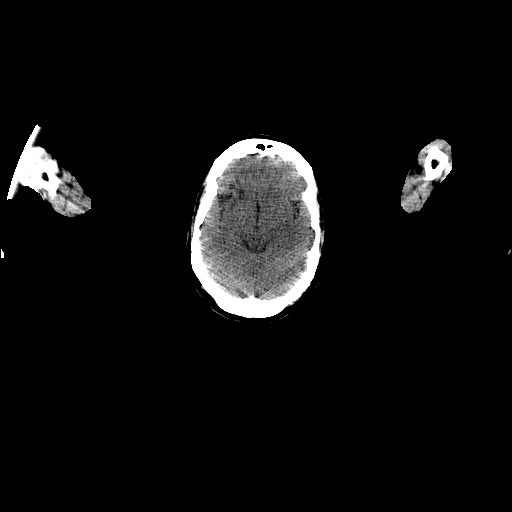

[Series 123: mip · coronal · 3.3mm · 4.69mm/px · 1 of 30 slices shown]
[im 1/30]
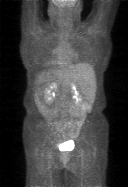

[Series 151: reformatted · axial · 3.3mm · 3.91mm/px · z∈[-872,-2]mm · 6 of 265 slices shown (1 of 2)]
[im 1/265]
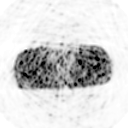
[im 53/265]
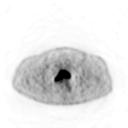
[im 106/265]
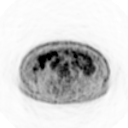
[im 159/265]
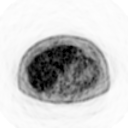
[im 212/265]
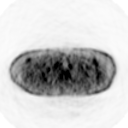
[im 265/265]
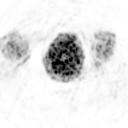

[Series 153: reformatted · coronal · 4.7mm · 6.98mm/px · 1 of 67 slices shown (2 of 2)]
[im 1/67]
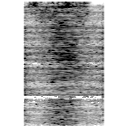

[25 of 25 positions shown; findings below may reference images not displayed]

FINDINGS: Neck: No hypermetabolic lymph nodes in the neck.

Chest:  At least three of the known pulmonary nodules are evident
on the current low dose CT, as follows:
--4 mm nodule in the posteromedial right upper lobe (series 2/image
69)
--5 mm subpleural nodule in the left upper lobe (series 2/image 70)
--9 x 7 mm nodule at the left lung base (series 2/image 99)

None of these nodules or hypermetabolic on PET, although even the
dominant nodule at the left lung base is at the lower limits for
PET sensitivity, especially in the setting of hyperglycemia.

No hypermetabolic mediastinal or hilar nodes.  Small bilateral
subpectoral nodes measuring up to 5-6 mm short axis (for example,
series 2/images 62 and 64).  Mildly prominent bilateral axillary
nodes, including:
--11 mm short-axis right axillary node (series 2/image 56), max SUV
1.9
--11 mm short-axis left axillary node (series 2/image 59), max SUV
2.4

Abdomen/Pelvis:  No abnormal hypermetabolic activity within the
liver, pancreas, adrenal glands, or spleen.

Cholelithiasis, without associated inflammatory changes by CT.
cm right upper pole cyst.

No hypermetabolic lymph nodes in the abdomen or pelvis.  Small
bilateral external iliac nodes measuring up to 8 mm short axis
(series 2/image 200), non-FDG-avid.

Skeleton:  No focal hypermetabolic activity to suggest skeletal
metastasis.

Degenerative changes of the visualized thoracolumbar spine.
IMPRESSION: Dominant nodule at the left lung base is non-FDG-avid but at the
lower limits for PET sensitivity, especially in the setting of
hyperglycemia.  Based on size, follow-up CT chest is suggested in 3
months.

Borderline enlarged bilateral axillary nodes, max SUV 2.4 on the
left.  While possibly reactive, a low grade lymphoproliferative
disorder remains possible.  Consider attention on follow-up or
ultrasound-guided biopsy as clinically warranted.

## 2013-02-06 MED ORDER — FLUDEOXYGLUCOSE F - 18 (FDG) INJECTION
12.4000 | Freq: Once | INTRAVENOUS | Status: AC | PRN
  Administered 2013-02-06: 12.4 via INTRAVENOUS

## 2013-02-09 ENCOUNTER — Other Ambulatory Visit (HOSPITAL_COMMUNITY): Payer: Self-pay

## 2013-02-12 ENCOUNTER — Other Ambulatory Visit (HOSPITAL_COMMUNITY): Payer: Self-pay | Admitting: Family Medicine

## 2013-02-12 DIAGNOSIS — R911 Solitary pulmonary nodule: Secondary | ICD-10-CM

## 2013-02-15 ENCOUNTER — Institutional Professional Consult (permissible substitution) (INDEPENDENT_AMBULATORY_CARE_PROVIDER_SITE_OTHER): Payer: Self-pay | Admitting: Cardiothoracic Surgery

## 2013-02-15 ENCOUNTER — Encounter: Payer: Self-pay | Admitting: Cardiothoracic Surgery

## 2013-02-15 VITALS — BP 221/114 | HR 97 | Resp 20 | Ht 65.5 in | Wt 169.0 lb

## 2013-02-15 DIAGNOSIS — R911 Solitary pulmonary nodule: Secondary | ICD-10-CM

## 2013-02-15 DIAGNOSIS — E11649 Type 2 diabetes mellitus with hypoglycemia without coma: Secondary | ICD-10-CM | POA: Insufficient documentation

## 2013-02-15 DIAGNOSIS — E119 Type 2 diabetes mellitus without complications: Secondary | ICD-10-CM

## 2013-02-15 DIAGNOSIS — I7789 Other specified disorders of arteries and arterioles: Secondary | ICD-10-CM

## 2013-02-15 DIAGNOSIS — R918 Other nonspecific abnormal finding of lung field: Secondary | ICD-10-CM

## 2013-02-15 DIAGNOSIS — I10 Essential (primary) hypertension: Secondary | ICD-10-CM

## 2013-02-15 DIAGNOSIS — M0520 Rheumatoid vasculitis with rheumatoid arthritis of unspecified site: Secondary | ICD-10-CM | POA: Insufficient documentation

## 2013-02-15 DIAGNOSIS — K8020 Calculus of gallbladder without cholecystitis without obstruction: Secondary | ICD-10-CM | POA: Insufficient documentation

## 2013-02-15 NOTE — Progress Notes (Signed)
[address]            [place] [address]          [phone]      Tracey Bryant  Medical Record [id number] Date of Birth: 11/14/1951  Referring: Nat Christen, MD Primary Care: Everrett Coombe, NP  Chief Complaint:    Chief Complaint  Patient presents with  . Lung Mass    MTOC/ surgical eval on lung mass, Chest CT 01/17/13, PET Scan 01/31/13    History of Present Illness:    Patient is a 62 year old female with no previous history of smoking who presented to the emergency room with lower back pain and no urine output for 5 days. Chills no noted that she was hypertensive and had an elevated glucose. She was referred from family care provider at [facility] emergency room. A CT scan of the abdomen was done leading to a CT scan of the chest and ultimately a PET scan. The patient is referred to thoracic surgery office because of the finding of lung nodules bilateral on chest CT. The patient denies any exposure to tuberculosis, has never smoked, does have a history of rheumatoid arthritis     Current Activity/ Functional Status:  Patient is independent with mobility/ambulation, transfers, ADL's, IADL's.  Zubrod Score: At the time of surgery this patient's most appropriate activity status/level should be described as: []  Normal activity, no symptoms [x]  Symptoms, fully ambulatory []  Symptoms, in bed less than or equal to 50% of the time []  Symptoms, in bed greater than 50% of the time but less than 100% []  Bedridden []  Moribund   Past Medical History  Diagnosis Date  . Diabetes mellitus without complication   . Hypertension   . Arthritis   . Cardiomyopathy     Past Surgical History  Procedure Laterality Date  . Breast tumor      Family History  Problem Relation Age of Onset  . Cancer Mother   . Diabetes Mother   . Heart disease Father     History   Social History  . Marital Status: Married    Spouse Name: N/A      Number of Children: N/A  . Years of Education: N/A   Occupational History  . Not on file.   Social History Main Topics  . Smoking status: Never Smoker   . Smokeless tobacco: Never Used  . Alcohol Use: No  . Drug Use: Not on file  . Sexually Active: Not on file   Other Topics Concern  . Not on file   Social History Narrative  . No narrative on file    History  Smoking status  . Never Smoker   Smokeless tobacco  . Never Used    History  Alcohol Use No     No Known Allergies  Current Outpatient Prescriptions  Medication Sig Dispense Refill  . lisinopril-hydrochlorothiazide (ZESTORETIC) 20-12.5 MG per tablet Take 1 tablet by mouth daily.  30 tablet  1  . meloxicam (MOBIC) 15 MG tablet Take 15 mg by mouth daily.      . metFORMIN (GLUCOPHAGE) 500 MG tablet Take 1,000 mg by mouth 2 (two) times daily with a meal.       No current facility-administered medications for this visit.       Review of Systems:     Cardiac Review of Systems: Y or N  Chest Pain [  n  ]  Resting SOB [   ] Exertional SOB  [  ]  Orthopnea [  ]   Pedal Edema [   ]    Palpitations [  ] Syncope  [  ]   Presyncope [   ]  General Review of Systems: [Y] = yes [  ]=no Constitional: recent weight change [  ]; anorexia [  ]; fatigue Blue.Reese  ]; nausea [  ]; night sweats [  ]; fever [  ]; or chills [  ];                                                                                                                                          Dental: poor dentition[y  ]; Last Dentist visit:   Eye : blurred vision [  ]; diplopia [   ]; vision changes [  ];  Amaurosis fugax[  ]; Resp: cough [  ];  wheezing[  ];  hemoptysis[  ]; shortness of breath[ y ]; paroxysmal nocturnal dyspnea[  ]; dyspnea on exertion[y  ]; or orthopnea[  ];  GI:  gallstones[  ], vomiting[  ];  dysphagia[  ]; melena[  ];  hematochezia [  ]; heartburn[  ];   Hx of  Colonoscopy[n  ]; GU: kidney stones [  ]; hematuria[  ];   dysuria [  ];   nocturia[  ];  history of     obstruction [  ]; urinary frequency [ y ]             Skin: rash, swelling[  ];, hair loss[  ];  peripheral edema[  ];  or itching[  ]; Musculosketetal: myalgias[ y ];  joint swelling[ y ];  joint erythema[ y ];  joint pain[y  ];  back pain[ y ];  Heme/Lymph: bruising[  ];  bleeding[  ];  anemia[  ];  Neuro: TIA[  ];  headaches[  ];  stroke[  ];  vertigo[  ];  seizures[  ];   paresthesias[  ];  difficulty walking[  ];  Psych:depression[  ]; anxiety[  ];  Endocrine: diabetes[ y ];  thyroid dysfunction[  ];  Immunizations: Flu [ n ]; Pneumococcal[ n ];  Other:  Physical Exam: BP 221/114  Pulse 97  Resp 20  Ht 5' 5.5" (1.664 m)  Wt 169 lb (76.658 kg)  BMI 27.69 kg/m2  SpO2 97%  General appearance: alert, cooperative, appears older than stated age, no distress and slowed mentation Neurologic: intact Heart: regular rate and rhythm, S1, S2 normal, no murmur, click, rub or gallop and normal apical impulse Lungs: clear to auscultation bilaterally and normal percussion bilaterally Abdomen: soft, non-tender; bowel sounds normal; no masses,  no organomegaly Extremities: extremities normal, atraumatic, no cyanosis or edema and Homans sign is negative, no sign of DVT   Diagnostic Studies & Laboratory  data:     Recent Radiology Findings:  Ct Abdomen Pelvis Wo Contrast  01/17/2013  *RADIOLOGY REPORT*  Clinical Data: Unable to urinate for 3 days.  Left flank pain. History of diabetes.  CT ABDOMEN AND PELVIS WITHOUT CONTRAST  Technique:  Multidetector CT imaging of the abdomen and pelvis was performed following the standard protocol without intravenous contrast.  Comparison: None.  Findings: A 1.1 cm nodule is noted at the left lung base.  The liver and spleen are unremarkable in appearance.  Multiple prominent stones are seen within the gallbladder; the gallbladder is otherwise unremarkable in appearance.  The pancreas and adrenal glands are unremarkable.  There is  minimal apparent left-sided hydronephrosis, involving only the lower pole of the left kidney; this could conceivably reflect a small parapelvic cyst.  However, there is asymmetric left-sided perinephric stranding, and stranding along the course of the left ureter.  This may reflect a recently passed stone, as no distal obstructing stone is identified, and no stones are seen within the bladder.  Alternatively, this could reflect pyelonephritis and ureteritis.  A large 8.5 cm cyst is noted arising along the lateral aspect of the right kidney.  The right kidney is otherwise unremarkable in appearance.  No nonobstructing renal stones are identified on either side.  No free fluid is identified.  The small bowel is unremarkable in appearance.  The stomach is within normal limits.  No acute vascular abnormalities are seen.  The appendix is normal in caliber, without evidence for appendicitis.  The colon is grossly unremarkable in appearance; a single diverticulum is incidentally noted along the mid sigmoid colon.  The bladder is mildly distended and grossly unremarkable in appearance.  The uterus is within normal limits.  The ovaries are grossly symmetric; no suspicious adnexal masses are seen.  No inguinal lymphadenopathy is seen.  No acute osseous abnormalities are identified.  Healed left-sided rib fractures are seen.  IMPRESSION:  1.  Minimal apparent left-sided hydronephrosis, involving only the lower pole of the left kidney; this could reflect a small parapelvic cyst.  However, there is asymmetric left-sided perinephric stranding, and stranding along the course of the left ureter.  This may reflect a recently passed stone, or possibly pyelonephritis and ureteritis.  No distal obstructing stones are identified. 2.  Large 8.5 cm right renal cyst seen. 3.  Cholelithiasis noted; gallbladder otherwise unremarkable in appearance. 4.  1.1 cm nodule at the left lung base; malignancy cannot be excluded.  PET / CT would be  helpful for further evaluation.   Original Report Authenticated By: Santa Lighter, M.D.    Dg Chest 2 View  01/17/2013  *RADIOLOGY REPORT*  Clinical Data: Hypertension and hyperglycemia.  Urinary retention.  CHEST - 2 VIEW  Comparison: None.  Findings: The heart size and pulmonary vascularity are normal and the lungs are clear.  No acute osseous abnormality. Old healed left posterior rib fractures.  IMPRESSION: No acute disease.   Original Report Authenticated By: Lorriane Shire, M.D.    Ct Chest W Contrast  01/17/2013  *RADIOLOGY REPORT*  Clinical Data: Evaluate left lung nodule.  CT CHEST WITH CONTRAST  Technique:  Multidetector CT imaging of the chest was performed following the standard protocol during bolus administration of intravenous contrast.  Contrast: 69mL OMNIPAQUE IOHEXOL 300 MG/ML  SOLN  Comparison: Abdominal CT 01/18/2012  Findings: There are small lymph nodes in the sub pectoralis region bilaterally.  There is also prominent lymph nodes in the axilla bilaterally.  Index lesion on the right  side measures 1.7 cm on sequence 2, image 9.  No significant mediastinal lymphadenopathy but there may be mild bilateral hilar lymphadenopathy.  Question a right hilar lymph node, measuring 1 cm in the short axis, on image 27.  No significant pericardial or pleural fluid.  There are gallstones.  Again noted is a large right renal cyst.  The trachea and the mainstem bronchi are patent.  There is a 1.1 cm noncalcified nodule at the left lung base.  There is a 5 mm nodule in the superior segment of the left lower lobe on sequence 3, image 15.  There are two adjacent nodules in the left upper lobe on image 16, largest measures 5 mm.  Pleural-based nodule along the right major fissure, measures approximately 5 mm.  Small nodule in the right middle lobe on image 25.  Small pleural-based nodule at the right lung base on image 37.  There are old left posterior eighth and ninth rib fractures.  IMPRESSION: There are  bilateral lung nodules.  The largest is a 1.1 cm nodule at the left lung base.  Prominent bilateral axillary lymph nodes and lymph nodes within the sub pectoralis region.  There is also concern for mild hilar lymphadenopathy bilaterally.  These findings are nonspecific but a neoplastic process cannot be excluded.  Recommend further evaluation with a PET-CT.  If multidisciplinary follow up management is desired, this is available in the [place] through the [facility] [phone].  Cholelithiasis.  Right kidney cyst.  These results were called by telephone on 01/17/2013 at 1:50 p.m. to Dr. Lacinda Axon, who verbally acknowledged these results.   Original Report Authenticated By: Markus Daft, M.D.    Nm Pet Image Initial (pi) Skull Base To Thigh  02/06/2013  *RADIOLOGY REPORT*  Clinical Data: Initial treatment strategy for left lower lobe lung nodule.  NUCLEAR MEDICINE PET SKULL BASE TO THIGH  Fasting Blood Glucose:  327  Technique:  12.4 mCi F-18 FDG was injected intravenously. CT data was obtained and used for attenuation correction and anatomic localization only.  (This was not acquired as a diagnostic CT examination.) Additional exam technical data entered on technologist worksheet.  Comparison:  CT chest abdomen pelvis dated 01/17/2013  Findings:  Neck: No hypermetabolic lymph nodes in the neck.  Chest:  At least three of the known pulmonary nodules are evident on the current low dose CT, as follows: --4 mm nodule in the posteromedial right upper lobe (series 2/image 69) --5 mm subpleural nodule in the left upper lobe (series 2/image 70) --9 x 7 mm nodule at the left lung base (series 2/image 99)  None of these nodules or hypermetabolic on PET, although even the dominant nodule at the left lung base is at the lower limits for PET sensitivity, especially in the setting of hyperglycemia.  No hypermetabolic mediastinal or hilar nodes.  Small bilateral subpectoral nodes measuring up to 5-6  mm short axis (for example, series 2/images 62 and 64).  Mildly prominent bilateral axillary nodes, including: --11 mm short-axis right axillary node (series 2/image 56), max SUV 1.9 --11 mm short-axis left axillary node (series 2/image 59), max SUV 2.4  Abdomen/Pelvis:  No abnormal hypermetabolic activity within the liver, pancreas, adrenal glands, or spleen.  Cholelithiasis, without associated inflammatory changes by CT.  7.5 cm right upper pole cyst.  No hypermetabolic lymph nodes in the abdomen or pelvis.  Small bilateral external iliac nodes measuring up to 8 mm short axis (series 2/image 200), non-FDG-avid.  Skeleton:  No focal hypermetabolic activity to suggest skeletal metastasis.  Degenerative changes of the visualized thoracolumbar spine.  IMPRESSION: Dominant nodule at the left lung base is non-FDG-avid but at the lower limits for PET sensitivity, especially in the setting of hyperglycemia.  Based on size, follow-up CT chest is suggested in 3 months.  Borderline enlarged bilateral axillary nodes, max SUV 2.4 on the left.  While possibly reactive, a low grade lymphoproliferative disorder remains possible.  Consider attention on follow-up or ultrasound-guided biopsy as clinically warranted.   Original Report Authenticated By: Julian Hy, M.D.     Recent Lab Findings: Lab Results  Component Value Date   WBC 18.7* 01/17/2013   HGB 13.1 01/17/2013   HCT 38.3 01/17/2013   PLT 444* 01/17/2013   GLUCOSE 315* 01/17/2013   NA 135 01/17/2013   K 3.6 01/17/2013   CL 98 01/17/2013   CREATININE 0.55 01/17/2013   BUN 12 01/17/2013   CO2 19 01/17/2013      Assessment / Plan:    bilateral lung nodules.  The largest is a 1.1 cm nodule at the left lung base NOT HYPERMETABLOIC Prominent bilateral axillary lymph nodes and lymph nodes within the sub pectoralis region.   Rheumatoid arthritis Large 8.5 cm right renal cyst seen.  Cholelithiasis noted; gallbladder otherwise unremarkable in appearance  I  reviewed the patient's CT scan and PET scan and discussed the findings with her. In nonsmoker with an 1.1 cm nodule that is not hypermetabolic on PET scan it's unlikely that this represents a carcinoma the lung. She has several other small 5 mm pulmonary nodules. I recommended to her that we obtain a followup CT scan in 4-5 months and if we see any change in size of any of the nodules proceed with more invasive testing and treatment. She notes she has not had a flu vaccination or pneumococcal vaccination,  She has never had a colonoscopy I will make arrangements for followup CT scan in 4-5 months she will discuss with her primary care followup with their primary care provider.    Grace Isaac MD  Beeper [phone] Office [phone] 02/15/2013 5:09 PM

## 2013-05-07 ENCOUNTER — Other Ambulatory Visit (HOSPITAL_COMMUNITY): Payer: Self-pay

## 2013-06-09 ENCOUNTER — Emergency Department (HOSPITAL_COMMUNITY)
Admission: EM | Admit: 2013-06-09 | Discharge: 2013-06-09 | Disposition: A | Discharge status: Home / Self Care | Payer: Medicaid Other | Attending: Emergency Medicine | Admitting: Emergency Medicine

## 2013-06-09 ENCOUNTER — Encounter (HOSPITAL_COMMUNITY): Payer: Self-pay | Admitting: Emergency Medicine

## 2013-06-09 DIAGNOSIS — Z8679 Personal history of other diseases of the circulatory system: Secondary | ICD-10-CM | POA: Insufficient documentation

## 2013-06-09 DIAGNOSIS — Z8739 Personal history of other diseases of the musculoskeletal system and connective tissue: Secondary | ICD-10-CM | POA: Insufficient documentation

## 2013-06-09 DIAGNOSIS — E119 Type 2 diabetes mellitus without complications: Secondary | ICD-10-CM | POA: Insufficient documentation

## 2013-06-09 DIAGNOSIS — S058X9A Other injuries of unspecified eye and orbit, initial encounter: Secondary | ICD-10-CM | POA: Insufficient documentation

## 2013-06-09 DIAGNOSIS — I10 Essential (primary) hypertension: Secondary | ICD-10-CM | POA: Insufficient documentation

## 2013-06-09 DIAGNOSIS — Y9389 Activity, other specified: Secondary | ICD-10-CM | POA: Insufficient documentation

## 2013-06-09 DIAGNOSIS — Z791 Long term (current) use of non-steroidal anti-inflammatories (NSAID): Secondary | ICD-10-CM | POA: Insufficient documentation

## 2013-06-09 DIAGNOSIS — X58XXXA Exposure to other specified factors, initial encounter: Secondary | ICD-10-CM | POA: Insufficient documentation

## 2013-06-09 DIAGNOSIS — S0502XA Injury of conjunctiva and corneal abrasion without foreign body, left eye, initial encounter: Secondary | ICD-10-CM

## 2013-06-09 DIAGNOSIS — Y9289 Other specified places as the place of occurrence of the external cause: Secondary | ICD-10-CM | POA: Insufficient documentation

## 2013-06-09 MED ORDER — TETRACAINE HCL 0.5 % OP SOLN
1.0000 [drp] | Freq: Once | OPHTHALMIC | Status: AC
  Administered 2013-06-09: 1 [drp] via OPHTHALMIC
  Filled 2013-06-09: qty 2

## 2013-06-09 MED ORDER — HYDROCODONE-ACETAMINOPHEN 5-325 MG PO TABS: 1.0000 | ORAL_TABLET | ORAL | Status: DC | PRN

## 2013-06-09 MED ORDER — ERYTHROMYCIN 5 MG/GM OP OINT
TOPICAL_OINTMENT | Freq: Once | OPHTHALMIC | Status: AC
  Administered 2013-06-09: 19:00:00 via OPHTHALMIC
  Filled 2013-06-09: qty 3.5

## 2013-06-09 NOTE — ED Provider Notes (Signed)
History     CSN: [id number]  Arrival date & time 06/09/13  1621   First MD Initiated Contact with Patient 06/09/13 1640      Chief Complaint  Patient presents with  . Eye Pain    (Consider location/radiation/quality/duration/timing/severity/associated sxs/prior treatment) HPI Comments: Tinie D Boettner is a 62 y.o. Female with left eye pain and irritation which started 3 days ago.  She has a distinct sensation of foreign body which is most consistently felt under her left upper  lateral eyelid.  She denies recalling any obvious  inciting event at the pain started gradually, but does mention she had been cleaning some broken glass from a window several days prior to the start of her symptoms.  She denies decreased visual acuity.  Her left eye is quite red and has been draining clear tears, no thick drainage from the eye.  She has had no medications for this and has found no alleviators.  She has had light sensitivity which is improved by wearing sunglasses.  She denies headaches, nausea.    The history is provided by the patient.    Past Medical History  Diagnosis Date  . Diabetes mellitus without complication   . Hypertension   . Arthritis   . Cardiomyopathy     Past Surgical History  Procedure Laterality Date  . Breast tumor      Family History  Problem Relation Age of Onset  . Cancer Mother   . Diabetes Mother   . Heart disease Father     History  Substance Use Topics  . Smoking status: Never Smoker   . Smokeless tobacco: Never Used  . Alcohol Use: No    OB History   Grav Para Term Preterm Abortions TAB SAB Ect Mult Living                  Review of Systems  Constitutional: Negative for fever.  HENT: Negative for congestion, sore throat and neck pain.   Eyes: Positive for photophobia, pain and redness. Negative for discharge, itching and visual disturbance.  Respiratory: Negative for chest tightness and shortness of breath.   Cardiovascular: Negative for  chest pain.  Gastrointestinal: Negative for nausea and abdominal pain.  Genitourinary: Negative.   Musculoskeletal: Negative for joint swelling and arthralgias.  Skin: Negative.  Negative for rash and wound.  Neurological: Negative for dizziness, weakness, light-headedness, numbness and headaches.  Psychiatric/Behavioral: Negative.     Allergies  Review of patient's allergies indicates no known allergies.  Home Medications   Current Outpatient Rx  Name  Route  Sig  Dispense  Refill  . HYDROcodone-acetaminophen (NORCO/VICODIN) 5-325 MG per tablet   Oral   Take 1 tablet by mouth every 4 (four) hours as needed for pain.   12 tablet   0   . lisinopril-hydrochlorothiazide (ZESTORETIC) 20-12.5 MG per tablet   Oral   Take 1 tablet by mouth daily.   30 tablet   1   . meloxicam (MOBIC) 15 MG tablet   Oral   Take 15 mg by mouth daily.         . metFORMIN (GLUCOPHAGE) 500 MG tablet   Oral   Take 1,000 mg by mouth 2 (two) times daily with a meal.           BP 166/77  Pulse 100  Temp(Src) 98.8 F (37.1 C) (Oral)  Resp 20  Ht 5\' 6"  (1.676 m)  Wt 169 lb (76.658 kg)  BMI 27.29 kg/m2  SpO2  98%  Physical Exam  Nursing note and vitals reviewed. Constitutional: She appears well-developed and well-nourished.  HENT:  Head: Normocephalic and atraumatic.  Eyes: EOM are normal. Pupils are equal, round, and reactive to light. No foreign bodies found. Left eye exhibits no discharge. No foreign body present in the left eye. Left conjunctiva is injected.  Slit lamp exam:      The left eye shows corneal abrasion and fluorescein uptake. The left eye shows no corneal flare, no corneal ulcer, no foreign body and no hyphema.  Irregular shaped moderate size corneal abrasion at the 6:00 position.  Neck: Normal range of motion.  Cardiovascular: Normal rate, regular rhythm, normal heart sounds and intact distal pulses.   Pulmonary/Chest: Effort normal and breath sounds normal. She has no  wheezes.  Abdominal: Soft. Bowel sounds are normal. There is no tenderness.  Musculoskeletal: Normal range of motion.  Neurological: She is alert.  Skin: Skin is warm and dry.  Psychiatric: She has a normal mood and affect.    ED Course  Procedures (including critical care time)  Patient's eye was flushed with normal saline with no improvement in foreign body sensation.  She was reexamined after the saline flush including everting eyelids with no foreign body visualized.  Labs Reviewed - No data to display No results found.   1. Corneal abrasion, left, initial encounter       MDM  Corneal abrasion of left eye at 6:00 position with sensation of foreign body at upper lateral eyelid margin without visualized foreign body present.  Suspect the sensation of foreign body may be simply referred pain from the  corneal abrasion she was given erythromycin ophthalmic ointment to apply 3 times a day to her back.  Hydrocodone.  Referral to Dr. Yolanda Bonine for recheck on Monday.  Patient's tetanus status is up to date.    Evalee Jefferson, PA-C 06/09/13 1836

## 2013-06-09 NOTE — ED Notes (Signed)
Pt c/o left eye pain since Wednesday. Pt states she was cleaning glass from floor and reports pain since. Pt is unsure if a piece of glass flew into her eye. Redness present.

## 2013-06-09 NOTE — ED Provider Notes (Signed)
Medical screening examination/treatment/procedure(s) were performed by non-physician practitioner and as supervising physician I was immediately available for consultation/collaboration.  Jasper Riling. Alvino Chapel, MD 06/09/13 [phone]

## 2013-06-13 ENCOUNTER — Other Ambulatory Visit: Payer: Self-pay

## 2013-06-13 DIAGNOSIS — D381 Neoplasm of uncertain behavior of trachea, bronchus and lung: Secondary | ICD-10-CM

## 2013-07-24 ENCOUNTER — Encounter: Payer: Self-pay | Admitting: Cardiothoracic Surgery

## 2013-07-24 ENCOUNTER — Ambulatory Visit (INDEPENDENT_AMBULATORY_CARE_PROVIDER_SITE_OTHER): Payer: Medicaid Other | Admitting: Cardiothoracic Surgery

## 2013-07-24 ENCOUNTER — Ambulatory Visit
Admission: RE | Admit: 2013-07-24 | Discharge: 2013-07-24 | Disposition: A | Discharge status: Home / Self Care | Payer: Medicaid Other | Source: Ambulatory Visit | Attending: Cardiothoracic Surgery | Admitting: Cardiothoracic Surgery

## 2013-07-24 VITALS — BP 174/96 | HR 100 | Resp 20 | Ht 66.0 in | Wt 169.0 lb

## 2013-07-24 DIAGNOSIS — D381 Neoplasm of uncertain behavior of trachea, bronchus and lung: Secondary | ICD-10-CM

## 2013-07-24 DIAGNOSIS — R918 Other nonspecific abnormal finding of lung field: Secondary | ICD-10-CM

## 2013-07-24 IMAGING — CT CT CHEST W/O CM
3 of 4 series · 15 of 30 positions shown, 16 images · non-contrast
Comparison: PET CT of [DATE] and CT chest of [DATE]

CLINICAL DATA: Follow up of lung nodules, occasional shortness of
breath

CT CHEST WITHOUT CONTRAST
TECHNIQUE: Multidetector CT imaging of the chest was performed
following the standard protocol without IV contrast.

[Series 3: chest w/o · axial · non-contrast · 0.87mm/px · z∈[-222,-56]mm · 4 of 56 slices shown, 5 images]
[im 12/56  mediastinal]
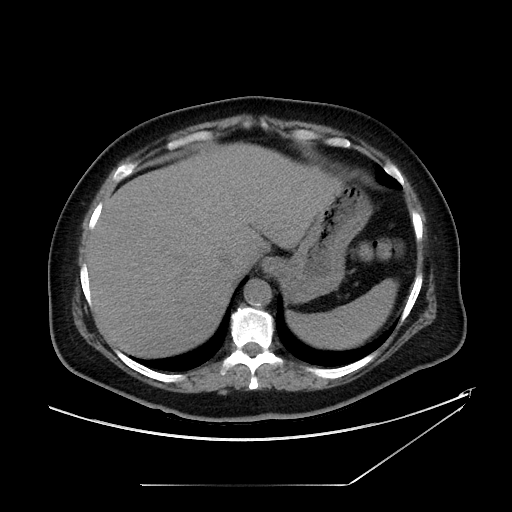
[im 12/56  lung]
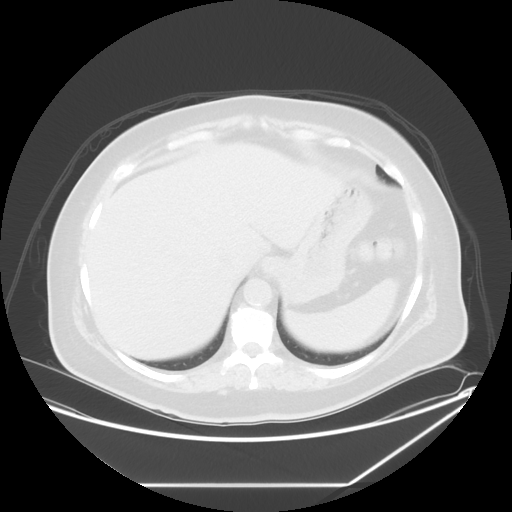
[im 23/56  lung]
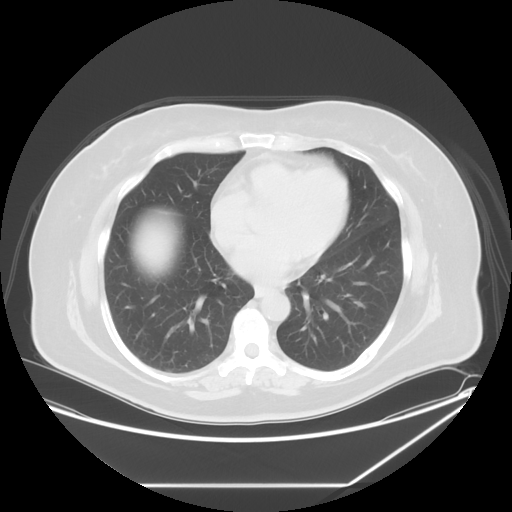
[im 34/56  lung]
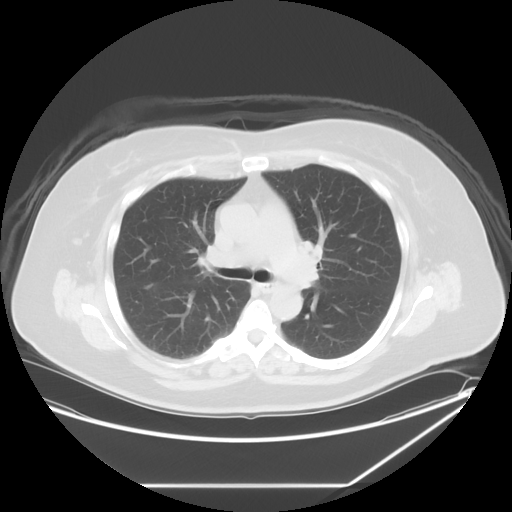
[im 45/56  lung]
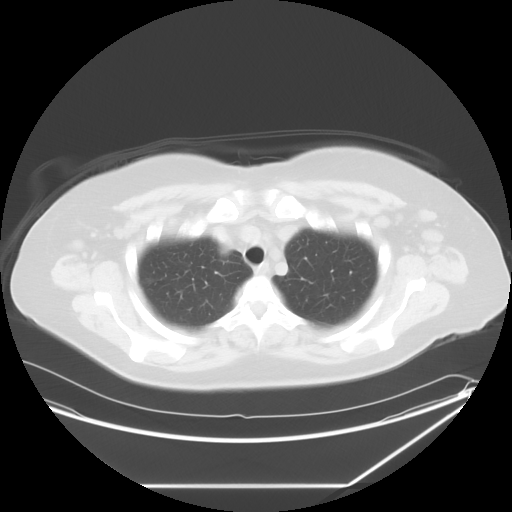

[Series 4: lung windows · axial · 0.87mm/px · z∈[-212,-72]mm · 3 of 56 slices shown]
[im 14/56  lung]
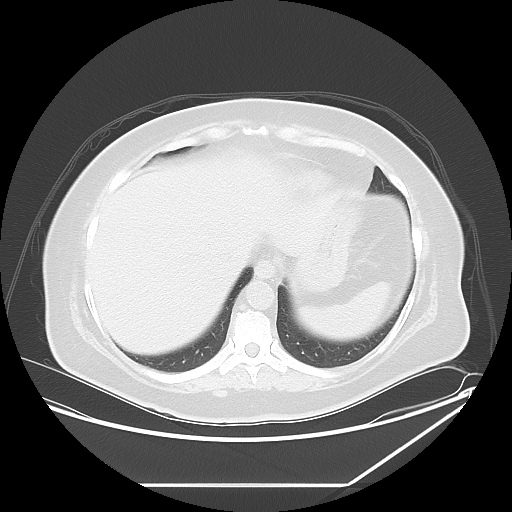
[im 28/56  lung]
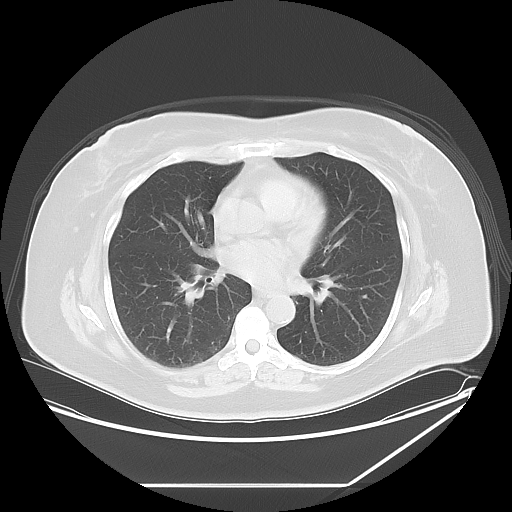
[im 42/56  lung]
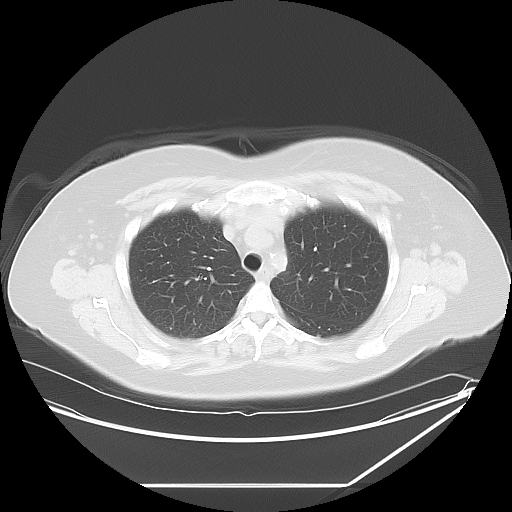

[Series 602: sagittal body · sagittal · 0.87mm/px · 8 of 180 slices shown]
[im 12/180  mediastinal]
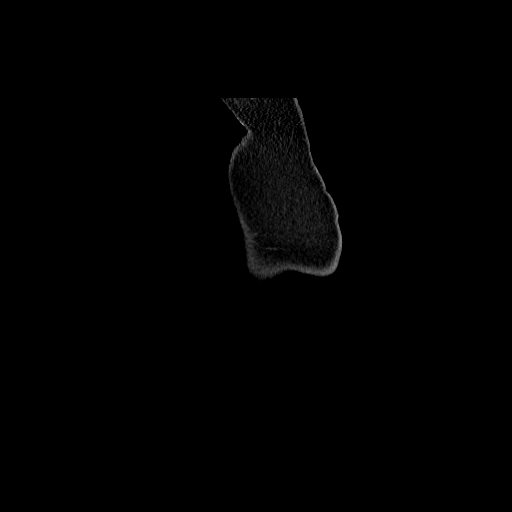
[im 34/180  mediastinal]
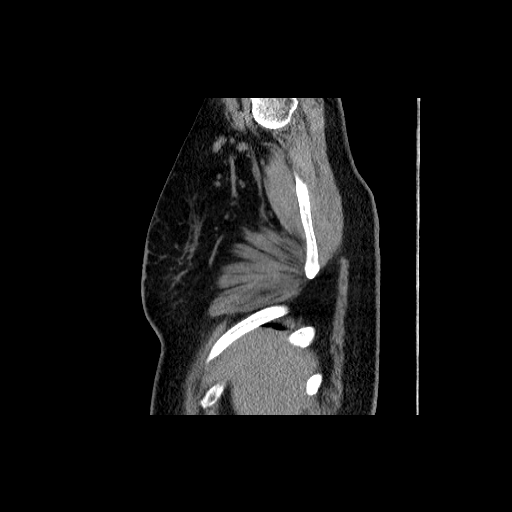
[im 56/180  mediastinal]
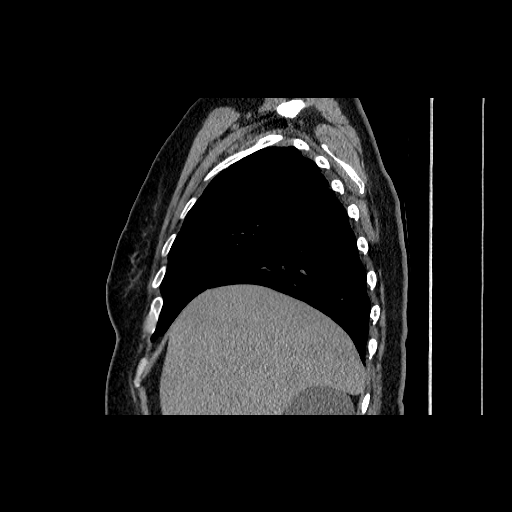
[im 79/180  mediastinal]
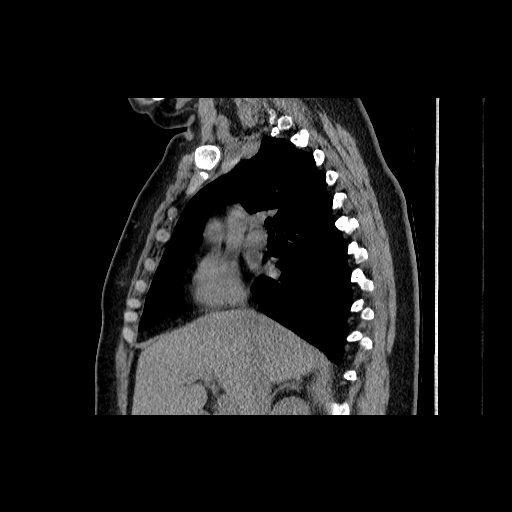
[im 101/180  mediastinal]
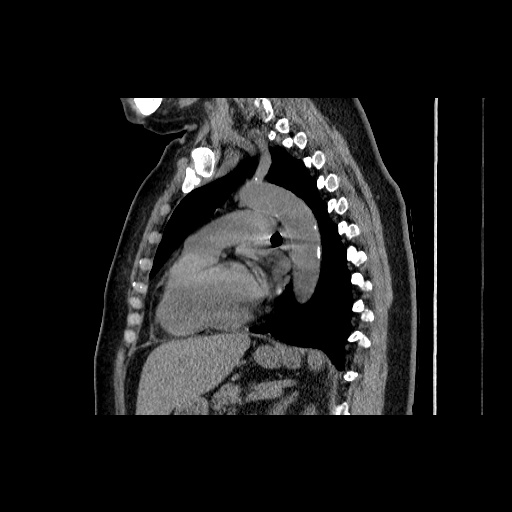
[im 124/180  mediastinal]
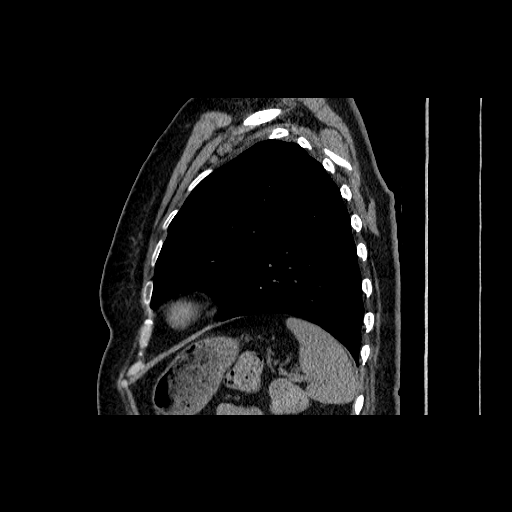
[im 146/180  mediastinal]
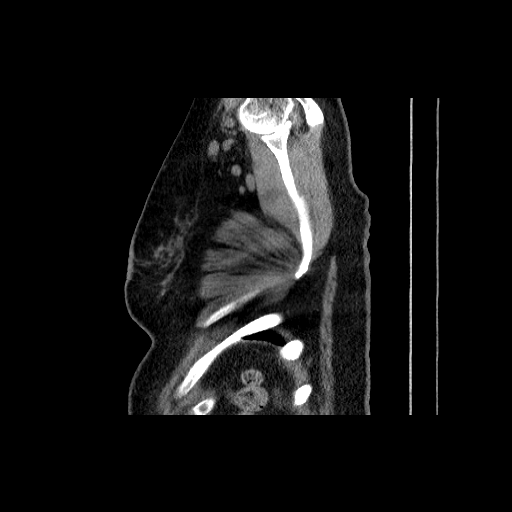
[im 168/180  mediastinal]
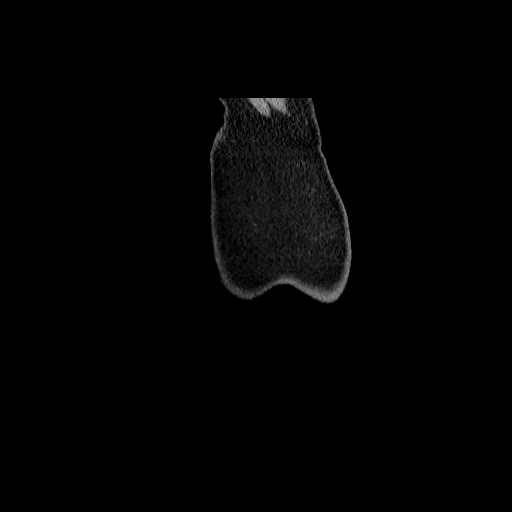

[15 of 30 positions shown; findings below may reference images not displayed]

FINDINGS: The dominant noncalcified oval nodule deep in the left
lateral lung sulcus is stable in size measuring 11 mm in diameter
and remains well circumscribed.  Small noncalcified nodules within
the posterior right upper lobe medially also are completely stable.
No new or enlarging lung nodule is seen.  No pleural effusion is
noted.  The central airway is patent.

On soft tissue window images, the thyroid gland is unremarkable.
Slightly prominent bilateral axillary lymph nodes appear relatively
stable.  No mediastinal or hilar adenopathy is seen on this
unenhanced study.  The upper abdomen that is visualized is
unremarkable other than a large cyst emanating from the region of
the upper pole of the right kidney.
IMPRESSION: 1.  Stable noncalcified lung nodules.  No new or enlarging lung
nodule is seen.
2.  No change in prominent axillary lymph nodes.  No mediastinal or
hilar adenopathy.

## 2013-07-24 NOTE — Progress Notes (Signed)
[address]       [place] [address]             [id number]                    Kamree D Staffieri [facility] Medical Record [id number] Date of Birth: [birth year]  Referring: Nat Christen, MD Primary Care: Everrett Coombe, NP  Chief Complaint:    Chief Complaint  Patient presents with  . Lung Lesion    5 month f/u with Chest CT    History of Present Illness:    Patient is a 62 year old female with no previous history of smoking who presented to the emergency room at [facility] Jan 2014 with lower back pain and no urine output for 5 days.  She was hypertensive and had an elevated glucose. She was referred from family care provider at [facility] emergency room. A CT scan of the abdomen was done leading to a CT scan of the chest and ultimately a PET scan. The patient is referred to thoracic surgery office because of the finding of lung nodules bilateral on chest CT. The patient denies any exposure to tuberculosis, has never smoked, does have a history of rheumatoid arthritis .  She now returns today for follow up ct 7 months after initial scan. She is now being actively treated by primary care and trying to get DM under control.     Current Activity/ Functional Status:  Patient is independent with mobility/ambulation, transfers, ADL's, IADL's.  Zubrod Score: At the time of surgery this patient's most appropriate activity status/level should be described as: []  Normal activity, no symptoms [x]  Symptoms, fully ambulatory []  Symptoms, in bed less than or equal to 50% of the time []  Symptoms, in bed greater than 50% of the time but less than 100% []  Bedridden []  Moribund   Past Medical History  Diagnosis Date  . Diabetes mellitus without complication   . Hypertension   . Arthritis   . Cardiomyopathy     Past Surgical History  Procedure Laterality Date  . Breast tumor      Family History  Problem Relation Age of Onset  . Cancer Mother   .  Diabetes Mother   . Heart disease Father     History   Social History  . Marital Status: Divorced    Spouse Name: N/A    Number of Children: N/A  . Years of Education: N/A   Occupational History  . Not on file.   Social History Main Topics  . Smoking status: Never Smoker   . Smokeless tobacco: Never Used  . Alcohol Use: No  . Drug Use: Not on file  . Sexually Active: Not on file   Other Topics Concern  . Not on file   Social History Narrative  . No narrative on file    History  Smoking status  . Never Smoker   Smokeless tobacco  . Never Used    History  Alcohol Use No     No Known Allergies  Current Outpatient Prescriptions  Medication Sig Dispense Refill  . amLODipine-valsartan (EXFORGE) 10-160 MG per tablet Take 1 tablet by mouth daily.      . cloNIDine (CATAPRES) 0.1 MG tablet Take 0.1 mg by mouth 2 (two) times daily.      . insulin glargine (LANTUS) 100 UNIT/ML injection Inject 50 Units into the skin at bedtime.      . insulin NPH-regular (NOVOLIN 70/30) (  70-30) 100 UNIT/ML injection Inject 10 Units into the skin 3 (three) times daily after meals.      . meloxicam (MOBIC) 15 MG tablet Take 15 mg by mouth daily.      . sitaGLIPtan-metformin (JANUMET) 50-1000 MG per tablet Take 1 tablet by mouth 2 (two) times daily with a meal.       No current facility-administered medications for this visit.       Review of Systems:     Cardiac Review of Systems: Y or N  Chest Pain [  n  ]  Resting SOB [   ] Exertional SOB  [  ]  Orthopnea [  ]   Pedal Edema [   ]    Palpitations [  ] Syncope  [  ]   Presyncope [   ]  General Review of Systems: [Y] = yes [  ]=no Constitional: recent weight change [  ]; anorexia [  ]; fatigue Blue.Reese  ]; nausea [  ]; night sweats [  ]; fever [  ]; or chills [  ];                                                                                                                                          Dental: poor dentition[y  ]; Last Dentist  visit:   Eye : blurred vision [  ]; diplopia [   ]; vision changes [  ];  Amaurosis fugax[  ]; Resp: cough [  ];  wheezing[  ];  hemoptysis[  ]; shortness of breath[ y ]; paroxysmal nocturnal dyspnea[  ]; dyspnea on exertion[y  ]; or orthopnea[  ];  GI:  gallstones[  ], vomiting[  ];  dysphagia[  ]; melena[  ];  hematochezia [  ]; heartburn[  ];   Hx of  Colonoscopy[n  ]; GU: kidney stones [  ]; hematuria[  ];   dysuria [  ];  nocturia[  ];  history of     obstruction [  ]; urinary frequency [ y ]             Skin: rash, swelling[  ];, hair loss[  ];  peripheral edema[  ];  or itching[  ]; Musculosketetal: myalgias[ y ];  joint swelling[ y ];  joint erythema[ y ];  joint pain[y  ];  back pain[ y ];  Heme/Lymph: bruising[  ];  bleeding[  ];  anemia[  ];  Neuro: TIA[  ];  headaches[  ];  stroke[  ];  vertigo[  ];  seizures[  ];   paresthesias[  ];  difficulty walking[  ];  Psych:depression[  ]; anxiety[  ];  Endocrine: diabetes[ y ];  thyroid dysfunction[  ];  Immunizations: Flu [ y]; Pneumococcal[ y]; and shingles  Other:  Physical Exam: BP 174/96  Pulse 100  Resp 20  Ht 5\' 6"  (1.676 m)  Wt 169 lb (76.658 kg)  BMI 27.29 kg/m2  SpO2 98%  General appearance: alert, cooperative, appears older than stated age, no distress and slowed mentation Neurologic: intact Heart: regular rate and rhythm, S1, S2 normal, no murmur, click, rub or gallop and normal apical impulse Lungs: clear to auscultation bilaterally and normal percussion bilaterally Abdomen: soft, non-tender; bowel sounds normal; no masses,  no organomegaly Extremities: extremities normal, atraumatic, no cyanosis or edema and Homans sign is negative, no sign of DVT She does move slowly secondary to arthritic changes. Do not appreciate any cervical or axillary adenopathy, though there is known axillary adenopathy on CT scan.  Diagnostic Studies & Laboratory data:     Recent Radiology Findings:   Ct Chest Wo Contrast  07/24/2013    *RADIOLOGY REPORT*  Clinical Data: Follow up of lung nodules, occasional shortness of breath  CT CHEST WITHOUT CONTRAST  Technique:  Multidetector CT imaging of the chest was performed following the standard protocol without IV contrast.  Comparison: PET CT of 02/06/2013 and CT chest of 01/17/2013  Findings: The dominant noncalcified oval nodule deep in the left lateral lung sulcus is stable in size measuring 11 mm in diameter and remains well circumscribed.  Small noncalcified nodules within the posterior right upper lobe medially also are completely stable. No new or enlarging lung nodule is seen.  No pleural effusion is noted.  The central airway is patent.  On soft tissue window images, the thyroid gland is unremarkable. Slightly prominent bilateral axillary lymph nodes appear relatively stable.  No mediastinal or hilar adenopathy is seen on this unenhanced study.  The upper abdomen that is visualized is unremarkable other than a large cyst emanating from the region of the upper pole of the right kidney.  IMPRESSION:  1.  Stable noncalcified lung nodules.  No new or enlarging lung nodule is seen. 2.  No change in prominent axillary lymph nodes.  No mediastinal or hilar adenopathy.   Original Report Authenticated By: Ivar Drape, M.D.   Ct Abdomen Pelvis Wo Contrast  01/17/2013  *RADIOLOGY REPORT*  Clinical Data: Unable to urinate for 3 days.  Left flank pain. History of diabetes.  CT ABDOMEN AND PELVIS WITHOUT CONTRAST  Technique:  Multidetector CT imaging of the abdomen and pelvis was performed following the standard protocol without intravenous contrast.  Comparison: None.  Findings: A 1.1 cm nodule is noted at the left lung base.  The liver and spleen are unremarkable in appearance.  Multiple prominent stones are seen within the gallbladder; the gallbladder is otherwise unremarkable in appearance.  The pancreas and adrenal glands are unremarkable.  There is minimal apparent left-sided hydronephrosis,  involving only the lower pole of the left kidney; this could conceivably reflect a small parapelvic cyst.  However, there is asymmetric left-sided perinephric stranding, and stranding along the course of the left ureter.  This may reflect a recently passed stone, as no distal obstructing stone is identified, and no stones are seen within the bladder.  Alternatively, this could reflect pyelonephritis and ureteritis.  A large 8.5 cm cyst is noted arising along the lateral aspect of the right kidney.  The right kidney is otherwise unremarkable in appearance.  No nonobstructing renal stones are identified on either side.  No free fluid is identified.  The small bowel is unremarkable in appearance.  The stomach is within normal limits.  No acute vascular abnormalities are seen.  The appendix is normal in caliber, without evidence for appendicitis.  The colon is grossly  unremarkable in appearance; a single diverticulum is incidentally noted along the mid sigmoid colon.  The bladder is mildly distended and grossly unremarkable in appearance.  The uterus is within normal limits.  The ovaries are grossly symmetric; no suspicious adnexal masses are seen.  No inguinal lymphadenopathy is seen.  No acute osseous abnormalities are identified.  Healed left-sided rib fractures are seen.  IMPRESSION:  1.  Minimal apparent left-sided hydronephrosis, involving only the lower pole of the left kidney; this could reflect a small parapelvic cyst.  However, there is asymmetric left-sided perinephric stranding, and stranding along the course of the left ureter.  This may reflect a recently passed stone, or possibly pyelonephritis and ureteritis.  No distal obstructing stones are identified. 2.  Large 8.5 cm right renal cyst seen. 3.  Cholelithiasis noted; gallbladder otherwise unremarkable in appearance. 4.  1.1 cm nodule at the left lung base; malignancy cannot be excluded.  PET / CT would be helpful for further evaluation.   Original  Report Authenticated By: Santa Lighter, M.D.    Dg Chest 2 View  01/17/2013  *RADIOLOGY REPORT*  Clinical Data: Hypertension and hyperglycemia.  Urinary retention.  CHEST - 2 VIEW  Comparison: None.  Findings: The heart size and pulmonary vascularity are normal and the lungs are clear.  No acute osseous abnormality. Old healed left posterior rib fractures.  IMPRESSION: No acute disease.   Original Report Authenticated By: Lorriane Shire, M.D.    Ct Chest W Contrast  01/17/2013  *RADIOLOGY REPORT*  Clinical Data: Evaluate left lung nodule.  CT CHEST WITH CONTRAST  Technique:  Multidetector CT imaging of the chest was performed following the standard protocol during bolus administration of intravenous contrast.  Contrast: 63mL OMNIPAQUE IOHEXOL 300 MG/ML  SOLN  Comparison: Abdominal CT 01/18/2012  Findings: There are small lymph nodes in the sub pectoralis region bilaterally.  There is also prominent lymph nodes in the axilla bilaterally.  Index lesion on the right side measures 1.7 cm on sequence 2, image 9.  No significant mediastinal lymphadenopathy but there may be mild bilateral hilar lymphadenopathy.  Question a right hilar lymph node, measuring 1 cm in the short axis, on image 27.  No significant pericardial or pleural fluid.  There are gallstones.  Again noted is a large right renal cyst.  The trachea and the mainstem bronchi are patent.  There is a 1.1 cm noncalcified nodule at the left lung base.  There is a 5 mm nodule in the superior segment of the left lower lobe on sequence 3, image 15.  There are two adjacent nodules in the left upper lobe on image 16, largest measures 5 mm.  Pleural-based nodule along the right major fissure, measures approximately 5 mm.  Small nodule in the right middle lobe on image 25.  Small pleural-based nodule at the right lung base on image 37.  There are old left posterior eighth and ninth rib fractures.  IMPRESSION: There are bilateral lung nodules.  The largest is a 1.1  cm nodule at the left lung base.  Prominent bilateral axillary lymph nodes and lymph nodes within the sub pectoralis region.  There is also concern for mild hilar lymphadenopathy bilaterally.  These findings are nonspecific but a neoplastic process cannot be excluded.  Recommend further evaluation with a PET-CT.  If multidisciplinary follow up management is desired, this is available in the [facility] through the [facility] [phone].  Cholelithiasis.  Right kidney cyst.  These results were called  by telephone on 01/17/2013 at 1:50 p.m. to Dr. Lacinda Axon, who verbally acknowledged these results.   Original Report Authenticated By: Markus Daft, M.D.    Nm Pet Image Initial (pi) Skull Base To Thigh  02/06/2013  *RADIOLOGY REPORT*  Clinical Data: Initial treatment strategy for left lower lobe lung nodule.  NUCLEAR MEDICINE PET SKULL BASE TO THIGH  Fasting Blood Glucose:  327  Technique:  12.4 mCi F-18 FDG was injected intravenously. CT data was obtained and used for attenuation correction and anatomic localization only.  (This was not acquired as a diagnostic CT examination.) Additional exam technical data entered on technologist worksheet.  Comparison:  CT chest abdomen pelvis dated 01/17/2013  Findings:  Neck: No hypermetabolic lymph nodes in the neck.  Chest:  At least three of the known pulmonary nodules are evident on the current low dose CT, as follows: --4 mm nodule in the posteromedial right upper lobe (series 2/image 69) --5 mm subpleural nodule in the left upper lobe (series 2/image 70) --9 x 7 mm nodule at the left lung base (series 2/image 99)  None of these nodules or hypermetabolic on PET, although even the dominant nodule at the left lung base is at the lower limits for PET sensitivity, especially in the setting of hyperglycemia.  No hypermetabolic mediastinal or hilar nodes.  Small bilateral subpectoral nodes measuring up to 5-6 mm short axis (for example, series 2/images  62 and 64).  Mildly prominent bilateral axillary nodes, including: --11 mm short-axis right axillary node (series 2/image 56), max SUV 1.9 --11 mm short-axis left axillary node (series 2/image 59), max SUV 2.4  Abdomen/Pelvis:  No abnormal hypermetabolic activity within the liver, pancreas, adrenal glands, or spleen.  Cholelithiasis, without associated inflammatory changes by CT.  7.5 cm right upper pole cyst.  No hypermetabolic lymph nodes in the abdomen or pelvis.  Small bilateral external iliac nodes measuring up to 8 mm short axis (series 2/image 200), non-FDG-avid.  Skeleton:  No focal hypermetabolic activity to suggest skeletal metastasis.  Degenerative changes of the visualized thoracolumbar spine.  IMPRESSION: Dominant nodule at the left lung base is non-FDG-avid but at the lower limits for PET sensitivity, especially in the setting of hyperglycemia.  Based on size, follow-up CT chest is suggested in 3 months.  Borderline enlarged bilateral axillary nodes, max SUV 2.4 on the left.  While possibly reactive, a low grade lymphoproliferative disorder remains possible.  Consider attention on follow-up or ultrasound-guided biopsy as clinically warranted.   Original Report Authenticated By: Julian Hy, M.D.     Recent Lab Findings: Lab Results  Component Value Date   WBC 18.7* 01/17/2013   HGB 13.1 01/17/2013   HCT 38.3 01/17/2013   PLT 444* 01/17/2013   GLUCOSE 315* 01/17/2013   NA 135 01/17/2013   K 3.6 01/17/2013   CL 98 01/17/2013   CREATININE 0.55 01/17/2013   BUN 12 01/17/2013   CO2 19 01/17/2013      Assessment / Plan:     bilateral lung nodules.  The largest is a 1.1 cm nodule at the left lung base NOT HYPERMETABOLIC on PET F/U ct scan of chest: Stable noncalcified lung nodules.  No new or enlarging lung nodule is seen, No change in prominent axillary lymph nodes.  No mediastinal or hilar adenopathy.     Rheumatoid arthritis Large 8.5 cm right renal cyst seen.  Cholelithiasis   Plan  followup CT scan of the chest in one year to ensure stability of the pulmonary nodules.  Grace Isaac MD  Beeper [phone] Office [phone] 07/24/2013 4:28 PM

## 2013-07-26 ENCOUNTER — Ambulatory Visit: Payer: Medicaid Other | Admitting: Cardiothoracic Surgery

## 2013-07-26 ENCOUNTER — Other Ambulatory Visit: Payer: Medicaid Other

## 2013-11-01 ENCOUNTER — Other Ambulatory Visit: Payer: Self-pay

## 2014-06-07 ENCOUNTER — Other Ambulatory Visit: Payer: Self-pay | Admitting: *Deleted

## 2014-06-07 DIAGNOSIS — R918 Other nonspecific abnormal finding of lung field: Secondary | ICD-10-CM

## 2014-07-18 ENCOUNTER — Other Ambulatory Visit: Payer: Medicaid Other

## 2014-07-18 ENCOUNTER — Ambulatory Visit: Payer: Medicaid Other | Admitting: Cardiothoracic Surgery

## 2015-06-23 ENCOUNTER — Other Ambulatory Visit: Payer: Self-pay

## 2017-06-24 ENCOUNTER — Encounter (INDEPENDENT_AMBULATORY_CARE_PROVIDER_SITE_OTHER): Payer: Self-pay | Admitting: *Deleted

## 2018-04-18 ENCOUNTER — Encounter (INDEPENDENT_AMBULATORY_CARE_PROVIDER_SITE_OTHER): Payer: Self-pay | Admitting: *Deleted

## 2018-05-18 ENCOUNTER — Other Ambulatory Visit: Payer: Self-pay | Admitting: Internal Medicine

## 2018-05-18 DIAGNOSIS — R599 Enlarged lymph nodes, unspecified: Secondary | ICD-10-CM

## 2018-05-18 DIAGNOSIS — R933 Abnormal findings on diagnostic imaging of other parts of digestive tract: Secondary | ICD-10-CM

## 2018-05-24 ENCOUNTER — Ambulatory Visit: Payer: Medicare HMO | Attending: Internal Medicine

## 2021-03-12 ENCOUNTER — Telehealth: Payer: Self-pay

## 2021-03-12 NOTE — Telephone Encounter (Signed)
Received referral on patient in February. Patient advised [place] she would call back, Tried to reach pt-unable to reach her-closed referral and notified PCP via fax

## 2021-03-15 ENCOUNTER — Other Ambulatory Visit: Payer: Self-pay

## 2021-03-15 ENCOUNTER — Emergency Department (HOSPITAL_COMMUNITY)
Admission: EM | Admit: 2021-03-15 | Discharge: 2021-03-15 | Disposition: A | Discharge status: Home / Self Care | Payer: Medicare HMO | Source: Home / Self Care | Attending: Emergency Medicine | Admitting: Emergency Medicine

## 2021-03-15 ENCOUNTER — Encounter (HOSPITAL_COMMUNITY): Payer: Self-pay

## 2021-03-15 DIAGNOSIS — Z7984 Long term (current) use of oral hypoglycemic drugs: Secondary | ICD-10-CM | POA: Insufficient documentation

## 2021-03-15 DIAGNOSIS — E119 Type 2 diabetes mellitus without complications: Secondary | ICD-10-CM | POA: Insufficient documentation

## 2021-03-15 DIAGNOSIS — Z8673 Personal history of transient ischemic attack (TIA), and cerebral infarction without residual deficits: Secondary | ICD-10-CM | POA: Insufficient documentation

## 2021-03-15 DIAGNOSIS — R040 Epistaxis: Secondary | ICD-10-CM | POA: Insufficient documentation

## 2021-03-15 DIAGNOSIS — Z79899 Other long term (current) drug therapy: Secondary | ICD-10-CM | POA: Insufficient documentation

## 2021-03-15 DIAGNOSIS — R112 Nausea with vomiting, unspecified: Secondary | ICD-10-CM | POA: Insufficient documentation

## 2021-03-15 DIAGNOSIS — Z950 Presence of cardiac pacemaker: Secondary | ICD-10-CM | POA: Insufficient documentation

## 2021-03-15 DIAGNOSIS — Z794 Long term (current) use of insulin: Secondary | ICD-10-CM | POA: Insufficient documentation

## 2021-03-15 DIAGNOSIS — Z7901 Long term (current) use of anticoagulants: Secondary | ICD-10-CM | POA: Insufficient documentation

## 2021-03-15 DIAGNOSIS — I10 Essential (primary) hypertension: Secondary | ICD-10-CM | POA: Insufficient documentation

## 2021-03-15 HISTORY — DX: Cerebral infarction, unspecified: I63.9

## 2021-03-15 LAB — CBC WITH DIFFERENTIAL/PLATELET
Abs Immature Granulocytes: 0.06 10*3/uL (ref 0.00–0.07)
Basophils Absolute: 0.1 10*3/uL (ref 0.0–0.1)
Basophils Relative: 0 %
Eosinophils Absolute: 0.3 10*3/uL (ref 0.0–0.5)
Eosinophils Relative: 2 %
HCT: 26.8 % — ABNORMAL LOW (ref 36.0–46.0)
Hemoglobin: 8.5 g/dL — ABNORMAL LOW (ref 12.0–15.0)
Immature Granulocytes: 0 %
Lymphocytes Relative: 16 %
Lymphs Abs: 2.2 10*3/uL (ref 0.7–4.0)
MCH: 29.3 pg (ref 26.0–34.0)
MCHC: 31.7 g/dL (ref 30.0–36.0)
MCV: 92.4 fL (ref 80.0–100.0)
Monocytes Absolute: 0.5 10*3/uL (ref 0.1–1.0)
Monocytes Relative: 3 %
Neutro Abs: 10.7 10*3/uL — ABNORMAL HIGH (ref 1.7–7.7)
Neutrophils Relative %: 79 %
Platelets: 323 10*3/uL (ref 150–400)
RBC: 2.9 MIL/uL — ABNORMAL LOW (ref 3.87–5.11)
RDW: 15.1 % (ref 11.5–15.5)
WBC: 13.8 10*3/uL — ABNORMAL HIGH (ref 4.0–10.5)
nRBC: 0 % (ref 0.0–0.2)

## 2021-03-15 LAB — BASIC METABOLIC PANEL
Anion gap: 11 (ref 5–15)
BUN: 54 mg/dL — ABNORMAL HIGH (ref 8–23)
CO2: 17 mmol/L — ABNORMAL LOW (ref 22–32)
Calcium: 8.9 mg/dL (ref 8.9–10.3)
Chloride: 107 mmol/L (ref 98–111)
Creatinine, Ser: 1.68 mg/dL — ABNORMAL HIGH (ref 0.44–1.00)
GFR, Estimated: 33 mL/min — ABNORMAL LOW (ref >60)
Glucose, Bld: 319 mg/dL — ABNORMAL HIGH (ref 70–99)
Potassium: 4.3 mmol/L (ref 3.5–5.1)
Sodium: 135 mmol/L (ref 135–145)

## 2021-03-15 MED ORDER — SILVER NITRATE-POT NITRATE 75-25 % EX MISC
1.0000 | Freq: Once | CUTANEOUS | Status: AC
  Administered 2021-03-15: 1 via TOPICAL
  Filled 2021-03-15: qty 10

## 2021-03-15 MED ORDER — COCAINE HCL 4 % EX SOLN
4.0000 mL | Freq: Once | CUTANEOUS | Status: AC
  Administered 2021-03-15: 4 mL via NASAL
  Filled 2021-03-15: qty 4

## 2021-03-15 MED ORDER — OXYMETAZOLINE HCL 0.05 % NA SOLN
1.0000 | Freq: Once | NASAL | Status: AC
  Administered 2021-03-15: 1 via NASAL
  Filled 2021-03-15: qty 30

## 2021-03-15 MED ORDER — LIDOCAINE-EPINEPHRINE (PF) 2 %-1:200000 IJ SOLN: 10.0000 mL | Freq: Once | INTRAMUSCULAR | Status: AC

## 2021-03-15 MED ORDER — LIDOCAINE-EPINEPHRINE (PF) 2 %-1:200000 IJ SOLN
INTRAMUSCULAR | Status: AC
  Filled 2021-03-15: qty 20

## 2021-03-15 MED ORDER — ONDANSETRON HCL 4 MG/2ML IJ SOLN
4.0000 mg | Freq: Once | INTRAMUSCULAR | Status: AC
  Administered 2021-03-15: 4 mg via INTRAVENOUS
  Filled 2021-03-15: qty 2

## 2021-03-15 NOTE — ED Notes (Signed)
purewick placed and to suction

## 2021-03-15 NOTE — ED Provider Notes (Signed)
[facility] EMERGENCY DEPARTMENT Provider Note   CSN: [id number] Arrival date & time: 03/15/21  1249     History Chief Complaint  Patient presents with  . Epistaxis    Tracey Bryant is a 70 y.o. female.  HPI      Tracey Bryant is a 70 y.o. female, with a history of cardiomyopathy, DM, HTN, stroke, presenting to the ED with epistaxis beginning around 8 PM last night. She has been removing clots from the nose as well as spitting up clots.  She endorses nausea and intermittent emesis. Patient is anticoagulated on Eliquis. Denies fever/chills, facial pain, facial trauma, dizziness, syncope, shortness of breath, headache, chest pain, or any other complaints.   Past Medical History:  Diagnosis Date  . Arthritis   . Cardiomyopathy ([place])   . Diabetes mellitus without complication ([place])   . Hypertension   . Stroke [facility])     Patient Active Problem List   Diagnosis Date Noted  . 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 Diabetes mellitus ([place]) 02/15/2013  . Hypertension 02/15/2013  . Gall bladder stones 02/15/2013  . Multiple lung nodules 02/15/2013    Past Surgical History:  Procedure Laterality Date  . breast tumor    . PACEMAKER IMPLANT    . TONSILLECTOMY       OB History   No obstetric history on file.     Family History  Problem Relation Age of Onset  . Cancer Mother   . Diabetes Mother   . Heart disease Father     Social History   Tobacco Use  . Smoking status: Never Smoker  . Smokeless tobacco: Never Used  Vaping Use  . Vaping Use: Never used  Substance Use Topics  . Alcohol use: No  . Drug use: Never    Home Medications Prior to Admission medications   Medication Sig Start Date End Date Taking? Authorizing Provider  amLODipine-valsartan (EXFORGE) 10-160 MG per tablet Take 1 tablet by mouth daily.    [provider]  cloNIDine (CATAPRES) 0.1 MG tablet Take 0.1 mg by mouth 2 (two) times daily.    [provider]  insulin  glargine (LANTUS) 100 UNIT/ML injection Inject 50 Units into the skin at bedtime.    [provider]  insulin NPH-regular (NOVOLIN 70/30) (70-30) 100 UNIT/ML injection Inject 10 Units into the skin 3 (three) times daily after meals.    [provider]  meloxicam (MOBIC) 15 MG tablet Take 15 mg by mouth daily.    [provider]  sitaGLIPtan-metformin (JANUMET) 50-1000 MG per tablet Take 1 tablet by mouth 2 (two) times daily with a meal.    [provider]    Allergies    Patient has no known allergies.  Review of Systems   Review of Systems  Constitutional: Negative for chills and fever.  HENT: Positive for nosebleeds.   Respiratory: Negative for shortness of breath.   Cardiovascular: Negative for chest pain.  Gastrointestinal: Positive for nausea and vomiting. Negative for abdominal pain.  Neurological: Negative for dizziness, syncope, weakness and headaches.  All other systems reviewed and are negative.   Physical Exam Updated Vital Signs BP (!) 159/73 (BP Location: Left Arm)   Pulse 84   Temp 98.5 F (36.9 C) (Oral)   Resp (!) 22   Ht 5\' 6"  (1.676 m)   Wt 74.8 kg   SpO2 100%   BMI 26.63 kg/m   Physical Exam Vitals and nursing note reviewed.  Constitutional:  General: She is not in acute distress.    Appearance: She is well-developed. She is not diaphoretic.  HENT:     Head: Normocephalic and atraumatic.     Comments: No facial pain, tenderness, swelling, or color abnormality.    Nose:     Comments: Epistaxis appears to be originating from the left nare.  There is currently clot in the left nare and bleeding from the right nare.  Patient states it feels as though the blood is "going around the back from the left to the right." Once the clot was evacuated, I was unable to directly visualize the area of hemorrhage in the left nare, hemorrhage coming from an area beyond my sight.  No hemorrhage at this point from the right nare.     Mouth/Throat:     Mouth: Mucous membranes are moist.     Pharynx: Oropharynx is clear.     Comments: No blood flowing down the back of the throat. Eyes:     Conjunctiva/sclera: Conjunctivae normal.  Cardiovascular:     Rate and Rhythm: Normal rate and regular rhythm.     Pulses: Normal pulses.          Radial pulses are 2+ on the right side and 2+ on the left side.     Heart sounds: Normal heart sounds.  Pulmonary:     Effort: Pulmonary effort is normal. No respiratory distress.     Breath sounds: Normal breath sounds.  Abdominal:     Palpations: Abdomen is soft.     Tenderness: There is no abdominal tenderness. There is no guarding.  Musculoskeletal:     Cervical back: Neck supple.  Skin:    General: Skin is warm and dry.  Neurological:     Mental Status: She is alert.  Psychiatric:        Mood and Affect: Mood and affect normal.        Speech: Speech normal.        Behavior: Behavior normal.     ED Results / Procedures / Treatments   Labs (all labs ordered are listed, but only abnormal results are displayed) Labs Reviewed  BASIC METABOLIC PANEL - Abnormal; Notable for the following components:      Result Value   CO2 17 (*)    Glucose, Bld 319 (*)    BUN 54 (*)    Creatinine, Ser 1.68 (*)    GFR, Estimated 33 (*)    All other components within normal limits  CBC WITH DIFFERENTIAL/PLATELET - Abnormal; Notable for the following components:   WBC 13.8 (*)    RBC 2.90 (*)    Hemoglobin 8.5 (*)    HCT 26.8 (*)    Neutro Abs 10.7 (*)    All other components within normal limits    EKG None  Radiology No results found.  Procedures .Epistaxis Management  Date/Time: 03/15/2021 1:15 PM Performed by: Lorayne Bender, PA-C Authorized by: Lorayne Bender, PA-C   Consent:    Consent obtained:  Verbal   Consent given by:  Patient   Risks, benefits, and alternatives were discussed: yes     Risks discussed:  Bleeding, infection, nasal injury and pain Universal protocol:     Procedure explained and questions answered to patient or proxy's satisfaction: yes     Patient identity confirmed:  Verbally with patient and provided demographic data Anesthesia:    Anesthesia method:  Topical application   Topical anesthetic:  Cocaine (Lidocaine) Procedure details:  Treatment site:  Unable to specify   Treatment method:  Anterior pack   Treatment complexity:  Extensive   Treatment episode: recurring   Post-procedure details:    Assessment:  Bleeding stopped   Procedure completion:  Tolerated well, no immediate complications     Medications Ordered in ED Medications  oxymetazoline (AFRIN) 0.05 % nasal spray 1 spray (1 spray Each Nare Given 03/15/21 1331)  lidocaine-EPINEPHrine (XYLOCAINE W/EPI) 2 %-1:200000 (PF) injection 10 mL ( Other Given by Other 03/15/21 1404)  ondansetron (ZOFRAN) injection 4 mg (4 mg Intravenous Given 03/15/21 1332)  cocaine 4 % topical solution 4 mL (4 mLs Nasal Given by Other 03/15/21 1402)  silver nitrate applicators applicator 1 Stick (1 Stick Topical Given by Other 03/15/21 1403)    ED Course  I have reviewed the triage vital signs and the nursing notes.  Pertinent labs & imaging results that were available during my care of the patient were reviewed by me and considered in my medical decision making (see chart for details).  Clinical Course as of 03/15/21 1819  Nancy Fetter Mar 15, 2021  1345 Hemoglobin(!): 8.5 Last value noted was in care everywhere January 2020 and was noted to be 10.6. [SJ]  1427 Applied cocaine with afrin on cotton ball. [SJ]  1530 Packing replaced. [SJ]  1630 Pulled out packing. [SJ]    Clinical Course User Index [SJ] Shanicka Oldenkamp, Helane Gunther, PA-C   MDM Rules/Calculators/A&P                          Patient presents with epistaxis.  It appears as though she has bleeding from the left nare, though it is more posterior than I am able to visualize. Pressure on the nose, Afrin sprays, and initial treatment with cocaine/Afrin  on cotton ball were unsuccessful, however, second packing with cocaine/Afrin on cotton ball was successful.  The final packing was removed and then the patient was observed for about an hour without evidence of rebleeding.  She will need to follow-up with her PCP versus ENT on this matter. The most recent labs we have to compare to today's labs are from 2 years ago.  She does not seem to be symptomatic to her anemia.  She also notes she thinks she does have some kidney dysfunction, but is unable to clarify this directly with numbers. The patient was given instructions for home care as well as return precautions. Patient voices understanding of these instructions, accepts the plan, and is comfortable with discharge.  Findings and plan of care discussed with attending physician, Lajean Saver, MD. Dr. Ashok Cordia personally evaluated and examined this patient.  Vitals:   03/15/21 1508 03/15/21 1600 03/15/21 1630 03/15/21 1802  BP:  (!) 149/73 (!) 160/77 (!) 162/84  Pulse: 89 86 84 81  Resp: 20 18    Temp:    98.3 F (36.8 C)  TempSrc:    Oral  SpO2: 99% 97% 98% 97%  Weight:      Height:        Final Clinical Impression(s) / ED Diagnoses Final diagnoses:  Epistaxis    Rx / DC Orders ED Discharge Orders    None       [name] 03/15/21 1819    Lajean Saver, MD 03/17/21 [phone]

## 2021-03-15 NOTE — Discharge Instructions (Signed)
  Please follow-up with the ear nose and throat specialist in the next 24 hours by calling the office to make an appointment.  Should rebleeding occur, apply pressure to either side of the nose consistently for 15 minutes.  If bleeding continues, reapply pressure for another 10 to 15 minutes. Avoid blowing your nose and do not stick anything into the nostrils, including your finger. Return to the ED for persistent bleeding.

## 2021-03-15 NOTE — ED Triage Notes (Signed)
Pt to er room number 17 via ems, per ems pt is here for a nose bleed, pt states that last night she started having a nose bleed, states that she has been bleeding off an on, states that she has a hx of a pacemaker and takes eliquis.  Pt states that she has vomited up some blood/ coffee ground liquid.

## 2021-03-16 ENCOUNTER — Encounter (HOSPITAL_COMMUNITY): Payer: Self-pay

## 2021-03-16 ENCOUNTER — Inpatient Hospital Stay (HOSPITAL_COMMUNITY)
Admission: EM | Admit: 2021-03-16 | Discharge: 2021-04-08 | DRG: 871 | Disposition: A | Discharge status: Skilled Nursing Facility | Payer: Medicare HMO | Attending: Internal Medicine | Admitting: Internal Medicine

## 2021-03-16 ENCOUNTER — Other Ambulatory Visit: Payer: Self-pay

## 2021-03-16 DIAGNOSIS — E0810 Diabetes mellitus due to underlying condition with ketoacidosis without coma: Secondary | ICD-10-CM | POA: Diagnosis not present

## 2021-03-16 DIAGNOSIS — E1129 Type 2 diabetes mellitus with other diabetic kidney complication: Secondary | ICD-10-CM | POA: Diagnosis not present

## 2021-03-16 DIAGNOSIS — K810 Acute cholecystitis: Secondary | ICD-10-CM

## 2021-03-16 DIAGNOSIS — Z950 Presence of cardiac pacemaker: Secondary | ICD-10-CM

## 2021-03-16 DIAGNOSIS — Z8249 Family history of ischemic heart disease and other diseases of the circulatory system: Secondary | ICD-10-CM

## 2021-03-16 DIAGNOSIS — M0520 Rheumatoid vasculitis with rheumatoid arthritis of unspecified site: Secondary | ICD-10-CM | POA: Diagnosis present

## 2021-03-16 DIAGNOSIS — I6621 Occlusion and stenosis of right posterior cerebral artery: Secondary | ICD-10-CM | POA: Diagnosis not present

## 2021-03-16 DIAGNOSIS — E875 Hyperkalemia: Secondary | ICD-10-CM | POA: Diagnosis present

## 2021-03-16 DIAGNOSIS — E1165 Type 2 diabetes mellitus with hyperglycemia: Secondary | ICD-10-CM

## 2021-03-16 DIAGNOSIS — Z791 Long term (current) use of non-steroidal anti-inflammatories (NSAID): Secondary | ICD-10-CM

## 2021-03-16 DIAGNOSIS — E1110 Type 2 diabetes mellitus with ketoacidosis without coma: Secondary | ICD-10-CM | POA: Diagnosis present

## 2021-03-16 DIAGNOSIS — I5021 Acute systolic (congestive) heart failure: Secondary | ICD-10-CM | POA: Diagnosis not present

## 2021-03-16 DIAGNOSIS — D638 Anemia in other chronic diseases classified elsewhere: Secondary | ICD-10-CM | POA: Diagnosis present

## 2021-03-16 DIAGNOSIS — I10 Essential (primary) hypertension: Secondary | ICD-10-CM | POA: Diagnosis not present

## 2021-03-16 DIAGNOSIS — K811 Chronic cholecystitis: Secondary | ICD-10-CM

## 2021-03-16 DIAGNOSIS — I700 Atherosclerosis of aorta: Secondary | ICD-10-CM | POA: Diagnosis present

## 2021-03-16 DIAGNOSIS — R188 Other ascites: Secondary | ICD-10-CM | POA: Diagnosis present

## 2021-03-16 DIAGNOSIS — R1011 Right upper quadrant pain: Secondary | ICD-10-CM

## 2021-03-16 DIAGNOSIS — Z7901 Long term (current) use of anticoagulants: Secondary | ICD-10-CM | POA: Diagnosis not present

## 2021-03-16 DIAGNOSIS — D62 Acute posthemorrhagic anemia: Secondary | ICD-10-CM | POA: Diagnosis not present

## 2021-03-16 DIAGNOSIS — E119 Type 2 diabetes mellitus without complications: Secondary | ICD-10-CM

## 2021-03-16 DIAGNOSIS — Z9114 Patient's other noncompliance with medication regimen: Secondary | ICD-10-CM

## 2021-03-16 DIAGNOSIS — N179 Acute kidney failure, unspecified: Secondary | ICD-10-CM | POA: Diagnosis not present

## 2021-03-16 DIAGNOSIS — I480 Paroxysmal atrial fibrillation: Secondary | ICD-10-CM | POA: Diagnosis present

## 2021-03-16 DIAGNOSIS — Z833 Family history of diabetes mellitus: Secondary | ICD-10-CM

## 2021-03-16 DIAGNOSIS — E669 Obesity, unspecified: Secondary | ICD-10-CM | POA: Diagnosis present

## 2021-03-16 DIAGNOSIS — I82432 Acute embolism and thrombosis of left popliteal vein: Secondary | ICD-10-CM | POA: Diagnosis not present

## 2021-03-16 DIAGNOSIS — Z20822 Contact with and (suspected) exposure to covid-19: Secondary | ICD-10-CM | POA: Diagnosis present

## 2021-03-16 DIAGNOSIS — E1122 Type 2 diabetes mellitus with diabetic chronic kidney disease: Secondary | ICD-10-CM | POA: Diagnosis present

## 2021-03-16 DIAGNOSIS — I5043 Acute on chronic combined systolic (congestive) and diastolic (congestive) heart failure: Secondary | ICD-10-CM | POA: Diagnosis present

## 2021-03-16 DIAGNOSIS — K8020 Calculus of gallbladder without cholecystitis without obstruction: Secondary | ICD-10-CM | POA: Diagnosis not present

## 2021-03-16 DIAGNOSIS — Z6834 Body mass index (BMI) 34.0-34.9, adult: Secondary | ICD-10-CM

## 2021-03-16 DIAGNOSIS — I2510 Atherosclerotic heart disease of native coronary artery without angina pectoris: Secondary | ICD-10-CM | POA: Diagnosis present

## 2021-03-16 DIAGNOSIS — Z794 Long term (current) use of insulin: Secondary | ICD-10-CM | POA: Diagnosis not present

## 2021-03-16 DIAGNOSIS — R7401 Elevation of levels of liver transaminase levels: Secondary | ICD-10-CM | POA: Diagnosis present

## 2021-03-16 DIAGNOSIS — D649 Anemia, unspecified: Secondary | ICD-10-CM

## 2021-03-16 DIAGNOSIS — B9620 Unspecified Escherichia coli [E. coli] as the cause of diseases classified elsewhere: Secondary | ICD-10-CM | POA: Diagnosis present

## 2021-03-16 DIAGNOSIS — Z9289 Personal history of other medical treatment: Secondary | ICD-10-CM

## 2021-03-16 DIAGNOSIS — R6520 Severe sepsis without septic shock: Secondary | ICD-10-CM | POA: Diagnosis present

## 2021-03-16 DIAGNOSIS — I429 Cardiomyopathy, unspecified: Secondary | ICD-10-CM | POA: Diagnosis present

## 2021-03-16 DIAGNOSIS — A419 Sepsis, unspecified organism: Secondary | ICD-10-CM | POA: Diagnosis present

## 2021-03-16 DIAGNOSIS — D631 Anemia in chronic kidney disease: Secondary | ICD-10-CM | POA: Diagnosis present

## 2021-03-16 DIAGNOSIS — E871 Hypo-osmolality and hyponatremia: Secondary | ICD-10-CM | POA: Diagnosis not present

## 2021-03-16 DIAGNOSIS — Z7984 Long term (current) use of oral hypoglycemic drugs: Secondary | ICD-10-CM

## 2021-03-16 DIAGNOSIS — M7989 Other specified soft tissue disorders: Secondary | ICD-10-CM | POA: Diagnosis not present

## 2021-03-16 DIAGNOSIS — R040 Epistaxis: Secondary | ICD-10-CM | POA: Diagnosis not present

## 2021-03-16 DIAGNOSIS — R918 Other nonspecific abnormal finding of lung field: Secondary | ICD-10-CM | POA: Diagnosis present

## 2021-03-16 DIAGNOSIS — K8000 Calculus of gallbladder with acute cholecystitis without obstruction: Secondary | ICD-10-CM | POA: Diagnosis present

## 2021-03-16 DIAGNOSIS — E1169 Type 2 diabetes mellitus with other specified complication: Secondary | ICD-10-CM

## 2021-03-16 DIAGNOSIS — K819 Cholecystitis, unspecified: Secondary | ICD-10-CM

## 2021-03-16 DIAGNOSIS — I69351 Hemiplegia and hemiparesis following cerebral infarction affecting right dominant side: Secondary | ICD-10-CM

## 2021-03-16 DIAGNOSIS — Z79899 Other long term (current) drug therapy: Secondary | ICD-10-CM

## 2021-03-16 DIAGNOSIS — E11649 Type 2 diabetes mellitus with hypoglycemia without coma: Secondary | ICD-10-CM

## 2021-03-16 DIAGNOSIS — R0602 Shortness of breath: Secondary | ICD-10-CM

## 2021-03-16 DIAGNOSIS — E876 Hypokalemia: Secondary | ICD-10-CM | POA: Diagnosis not present

## 2021-03-16 DIAGNOSIS — N390 Urinary tract infection, site not specified: Secondary | ICD-10-CM

## 2021-03-16 DIAGNOSIS — I272 Pulmonary hypertension, unspecified: Secondary | ICD-10-CM | POA: Diagnosis present

## 2021-03-16 DIAGNOSIS — Z531 Procedure and treatment not carried out because of patient's decision for reasons of belief and group pressure: Secondary | ICD-10-CM | POA: Diagnosis present

## 2021-03-16 DIAGNOSIS — E785 Hyperlipidemia, unspecified: Secondary | ICD-10-CM | POA: Diagnosis present

## 2021-03-16 DIAGNOSIS — N1831 Chronic kidney disease, stage 3a: Secondary | ICD-10-CM | POA: Diagnosis present

## 2021-03-16 DIAGNOSIS — I639 Cerebral infarction, unspecified: Secondary | ICD-10-CM | POA: Diagnosis present

## 2021-03-16 DIAGNOSIS — D696 Thrombocytopenia, unspecified: Secondary | ICD-10-CM | POA: Diagnosis not present

## 2021-03-16 DIAGNOSIS — R0902 Hypoxemia: Secondary | ICD-10-CM

## 2021-03-16 DIAGNOSIS — I130 Hypertensive heart and chronic kidney disease with heart failure and stage 1 through stage 4 chronic kidney disease, or unspecified chronic kidney disease: Secondary | ICD-10-CM | POA: Diagnosis present

## 2021-03-16 DIAGNOSIS — D72823 Leukemoid reaction: Secondary | ICD-10-CM | POA: Diagnosis not present

## 2021-03-16 LAB — CBG MONITORING, ED
Glucose-Capillary: 369 mg/dL — ABNORMAL HIGH (ref 70–99)
Glucose-Capillary: 385 mg/dL — ABNORMAL HIGH (ref 70–99)
Glucose-Capillary: 362 mg/dL — ABNORMAL HIGH (ref 70–99)
Glucose-Capillary: 309 mg/dL — ABNORMAL HIGH (ref 70–99)

## 2021-03-16 LAB — TYPE AND SCREEN
ABO/RH(D): A POS
Antibody Screen: NEGATIVE

## 2021-03-16 LAB — BLOOD GAS, VENOUS
Acid-base deficit: 9.9 mmol/L — ABNORMAL HIGH (ref 0.0–2.0)
Bicarbonate: 16 mmol/L — ABNORMAL LOW (ref 20.0–28.0)
FIO2: 21
O2 Saturation: 50.2 %
Patient temperature: 37
pCO2, Ven: 29.5 mmHg — ABNORMAL LOW (ref 44.0–60.0)
pH, Ven: 7.325 (ref 7.250–7.430)
pO2, Ven: 33 mmHg (ref 32.0–45.0)

## 2021-03-16 LAB — BASIC METABOLIC PANEL
Anion gap: 13 (ref 5–15)
Anion gap: 11 (ref 5–15)
BUN: 54 mg/dL — ABNORMAL HIGH (ref 8–23)
BUN: 62 mg/dL — ABNORMAL HIGH (ref 8–23)
CO2: 11 mmol/L — ABNORMAL LOW (ref 22–32)
CO2: 14 mmol/L — ABNORMAL LOW (ref 22–32)
Calcium: 6.8 mg/dL — ABNORMAL LOW (ref 8.9–10.3)
Calcium: 7.7 mg/dL — ABNORMAL LOW (ref 8.9–10.3)
Chloride: 117 mmol/L — ABNORMAL HIGH (ref 98–111)
Chloride: 111 mmol/L (ref 98–111)
Creatinine, Ser: 1.95 mg/dL — ABNORMAL HIGH (ref 0.44–1.00)
Creatinine, Ser: 2.33 mg/dL — ABNORMAL HIGH (ref 0.44–1.00)
GFR, Estimated: 27 mL/min — ABNORMAL LOW (ref >60)
GFR, Estimated: 22 mL/min — ABNORMAL LOW (ref >60)
Glucose, Bld: 332 mg/dL — ABNORMAL HIGH (ref 70–99)
Glucose, Bld: 219 mg/dL — ABNORMAL HIGH (ref 70–99)
Potassium: 4.3 mmol/L (ref 3.5–5.1)
Potassium: 4.7 mmol/L (ref 3.5–5.1)
Sodium: 141 mmol/L (ref 135–145)
Sodium: 136 mmol/L (ref 135–145)

## 2021-03-16 LAB — GLUCOSE, CAPILLARY
Glucose-Capillary: 232 mg/dL — ABNORMAL HIGH (ref 70–99)
Glucose-Capillary: 205 mg/dL — ABNORMAL HIGH (ref 70–99)
Glucose-Capillary: 201 mg/dL — ABNORMAL HIGH (ref 70–99)
Glucose-Capillary: 198 mg/dL — ABNORMAL HIGH (ref 70–99)
Glucose-Capillary: 196 mg/dL — ABNORMAL HIGH (ref 70–99)
Glucose-Capillary: 209 mg/dL — ABNORMAL HIGH (ref 70–99)

## 2021-03-16 LAB — IRON AND TIBC
Iron: 54 ug/dL (ref 28–170)
Saturation Ratios: 14 % (ref 10.4–31.8)
TIBC: 374 ug/dL (ref 250–450)
UIBC: 320 ug/dL

## 2021-03-16 LAB — CBC WITH DIFFERENTIAL/PLATELET
Abs Immature Granulocytes: 0.12 10*3/uL — ABNORMAL HIGH (ref 0.00–0.07)
Basophils Absolute: 0 10*3/uL (ref 0.0–0.1)
Basophils Relative: 0 %
Eosinophils Absolute: 0 10*3/uL (ref 0.0–0.5)
Eosinophils Relative: 0 %
HCT: 22.8 % — ABNORMAL LOW (ref 36.0–46.0)
Hemoglobin: 7.1 g/dL — ABNORMAL LOW (ref 12.0–15.0)
Immature Granulocytes: 1 %
Lymphocytes Relative: 12 %
Lymphs Abs: 2 10*3/uL (ref 0.7–4.0)
MCH: 29.5 pg (ref 26.0–34.0)
MCHC: 31.1 g/dL (ref 30.0–36.0)
MCV: 94.6 fL (ref 80.0–100.0)
Monocytes Absolute: 0.5 10*3/uL (ref 0.1–1.0)
Monocytes Relative: 3 %
Neutro Abs: 13.7 10*3/uL — ABNORMAL HIGH (ref 1.7–7.7)
Neutrophils Relative %: 84 %
Platelets: 314 10*3/uL (ref 150–400)
RBC: 2.41 MIL/uL — ABNORMAL LOW (ref 3.87–5.11)
RDW: 15.7 % — ABNORMAL HIGH (ref 11.5–15.5)
WBC: 16.3 10*3/uL — ABNORMAL HIGH (ref 4.0–10.5)
nRBC: 0 % (ref 0.0–0.2)

## 2021-03-16 LAB — COMPREHENSIVE METABOLIC PANEL
ALT: 29 U/L (ref 0–44)
AST: 45 U/L — ABNORMAL HIGH (ref 15–41)
Albumin: 2.9 g/dL — ABNORMAL LOW (ref 3.5–5.0)
Alkaline Phosphatase: 37 U/L — ABNORMAL LOW (ref 38–126)
Anion gap: 17 — ABNORMAL HIGH (ref 5–15)
BUN: 62 mg/dL — ABNORMAL HIGH (ref 8–23)
CO2: 11 mmol/L — ABNORMAL LOW (ref 22–32)
Calcium: 8.2 mg/dL — ABNORMAL LOW (ref 8.9–10.3)
Chloride: 106 mmol/L (ref 98–111)
Creatinine, Ser: 2.28 mg/dL — ABNORMAL HIGH (ref 0.44–1.00)
GFR, Estimated: 23 mL/min — ABNORMAL LOW (ref >60)
Glucose, Bld: 416 mg/dL — ABNORMAL HIGH (ref 70–99)
Potassium: 5.4 mmol/L — ABNORMAL HIGH (ref 3.5–5.1)
Sodium: 134 mmol/L — ABNORMAL LOW (ref 135–145)
Total Bilirubin: 1 mg/dL (ref 0.3–1.2)
Total Protein: 6.3 g/dL — ABNORMAL LOW (ref 6.5–8.1)

## 2021-03-16 LAB — BRAIN NATRIURETIC PEPTIDE: B Natriuretic Peptide: 1504 pg/mL — ABNORMAL HIGH (ref 0.0–100.0)

## 2021-03-16 LAB — FERRITIN: Ferritin: 99 ng/mL (ref 11–307)

## 2021-03-16 LAB — HEMOGLOBIN A1C
Hgb A1c MFr Bld: 9.1 % — ABNORMAL HIGH (ref 4.8–5.6)
Mean Plasma Glucose: 214.47 mg/dL

## 2021-03-16 LAB — BETA-HYDROXYBUTYRIC ACID
Beta-Hydroxybutyric Acid: 3.87 mmol/L — ABNORMAL HIGH (ref 0.05–0.27)
Beta-Hydroxybutyric Acid: 3.02 mmol/L — ABNORMAL HIGH (ref 0.05–0.27)
Beta-Hydroxybutyric Acid: 0.07 mmol/L (ref 0.05–0.27)

## 2021-03-16 LAB — LIPASE, BLOOD: Lipase: 32 U/L (ref 11–51)

## 2021-03-16 LAB — POC OCCULT BLOOD, ED
Fecal Occult Bld: NEGATIVE
Fecal Occult Bld: NEGATIVE

## 2021-03-16 MED ORDER — GLIPIZIDE ER 5 MG PO TB24
5.0000 mg | ORAL_TABLET | Freq: Every day | ORAL | Status: DC
  Administered 2021-03-17 – 2021-03-18 (×2): 5 mg via ORAL
  Filled 2021-03-16 (×2): qty 1

## 2021-03-16 MED ORDER — MAGNESIUM CITRATE PO SOLN: 1.0000 | Freq: Once | ORAL | Status: DC | PRN

## 2021-03-16 MED ORDER — LACTATED RINGERS IV BOLUS
20.0000 mL/kg | Freq: Once | INTRAVENOUS | Status: AC
  Administered 2021-03-16: 1500 mL via INTRAVENOUS

## 2021-03-16 MED ORDER — FUROSEMIDE 20 MG PO TABS
20.0000 mg | ORAL_TABLET | Freq: Every day | ORAL | Status: DC
  Administered 2021-03-17: 20 mg via ORAL
  Filled 2021-03-16: qty 1

## 2021-03-16 MED ORDER — ONDANSETRON HCL 4 MG/2ML IJ SOLN
4.0000 mg | Freq: Four times a day (QID) | INTRAMUSCULAR | Status: DC | PRN
  Administered 2021-03-16 – 2021-03-19 (×5): 4 mg via INTRAVENOUS
  Filled 2021-03-16 (×5): qty 2

## 2021-03-16 MED ORDER — ACETAMINOPHEN 650 MG RE SUPP: 650.0000 mg | Freq: Four times a day (QID) | RECTAL | Status: DC | PRN

## 2021-03-16 MED ORDER — SODIUM CHLORIDE 0.9 % IV SOLN
250.0000 mL | INTRAVENOUS | Status: DC | PRN
Start: 2021-03-16 — End: 2021-03-16

## 2021-03-16 MED ORDER — LEVALBUTEROL HCL 0.63 MG/3ML IN NEBU
0.6300 mg | INHALATION_SOLUTION | Freq: Four times a day (QID) | RESPIRATORY_TRACT | Status: DC | PRN
  Filled 2021-03-16: qty 3

## 2021-03-16 MED ORDER — LACTATED RINGERS IV SOLN: INTRAVENOUS | Status: DC

## 2021-03-16 MED ORDER — SODIUM CHLORIDE 0.9% FLUSH
3.0000 mL | Freq: Two times a day (BID) | INTRAVENOUS | Status: DC
  Administered 2021-03-16 – 2021-04-05 (×38): 3 mL via INTRAVENOUS

## 2021-03-16 MED ORDER — ONDANSETRON HCL 4 MG/2ML IJ SOLN
4.0000 mg | Freq: Once | INTRAMUSCULAR | Status: AC
  Administered 2021-03-16: 4 mg via INTRAVENOUS
  Filled 2021-03-16: qty 2

## 2021-03-16 MED ORDER — OXYCODONE HCL 5 MG PO TABS
5.0000 mg | ORAL_TABLET | ORAL | Status: DC | PRN
  Administered 2021-03-23 – 2021-04-07 (×18): 5 mg via ORAL
  Filled 2021-03-16 (×18): qty 1

## 2021-03-16 MED ORDER — TRAZODONE HCL 50 MG PO TABS
25.0000 mg | ORAL_TABLET | Freq: Every evening | ORAL | Status: DC | PRN
  Administered 2021-03-23 – 2021-04-07 (×9): 25 mg via ORAL
  Filled 2021-03-16 (×9): qty 1

## 2021-03-16 MED ORDER — SENNOSIDES-DOCUSATE SODIUM 8.6-50 MG PO TABS
1.0000 | ORAL_TABLET | Freq: Every evening | ORAL | Status: DC | PRN
  Administered 2021-03-17: 1 via ORAL
  Filled 2021-03-16 (×2): qty 1

## 2021-03-16 MED ORDER — BISACODYL 5 MG PO TBEC
5.0000 mg | DELAYED_RELEASE_TABLET | Freq: Every day | ORAL | Status: DC | PRN
  Administered 2021-03-17: 5 mg via ORAL
  Filled 2021-03-16: qty 1

## 2021-03-16 MED ORDER — IPRATROPIUM BROMIDE 0.02 % IN SOLN
0.5000 mg | Freq: Four times a day (QID) | RESPIRATORY_TRACT | Status: DC | PRN
  Administered 2021-03-27: 0.5 mg via RESPIRATORY_TRACT
  Filled 2021-03-16: qty 2.5

## 2021-03-16 MED ORDER — HYDROMORPHONE HCL 1 MG/ML IJ SOLN
0.5000 mg | INTRAMUSCULAR | Status: DC | PRN
  Administered 2021-03-16: 1 mg via INTRAVENOUS
  Administered 2021-03-19: 0.5 mg via INTRAVENOUS
  Filled 2021-03-16: qty 1
  Filled 2021-03-16: qty 0.5

## 2021-03-16 MED ORDER — ACETAMINOPHEN 325 MG PO TABS: 650.0000 mg | ORAL_TABLET | Freq: Four times a day (QID) | ORAL | Status: DC | PRN

## 2021-03-16 MED ORDER — SODIUM CHLORIDE 0.9 % IV BOLUS
1000.0000 mL | Freq: Once | INTRAVENOUS | Status: AC
  Administered 2021-03-16: 1000 mL via INTRAVENOUS

## 2021-03-16 MED ORDER — HYDRALAZINE HCL 20 MG/ML IJ SOLN: 10.0000 mg | INTRAMUSCULAR | Status: DC | PRN

## 2021-03-16 MED ORDER — SODIUM CHLORIDE 0.9% FLUSH: 3.0000 mL | INTRAVENOUS | Status: DC | PRN

## 2021-03-16 MED ORDER — HEPARIN SODIUM (PORCINE) 5000 UNIT/ML IJ SOLN
5000.0000 [IU] | Freq: Three times a day (TID) | INTRAMUSCULAR | Status: DC
  Administered 2021-03-16: 5000 [IU] via SUBCUTANEOUS
  Filled 2021-03-16: qty 1

## 2021-03-16 MED ORDER — INSULIN REGULAR(HUMAN) IN NACL 100-0.9 UT/100ML-% IV SOLN
INTRAVENOUS | Status: DC
  Administered 2021-03-16: 10.5 [IU]/h via INTRAVENOUS
  Administered 2021-03-17: 5 [IU]/h via INTRAVENOUS
  Filled 2021-03-16: qty 100

## 2021-03-16 MED ORDER — DEXTROSE IN LACTATED RINGERS 5 % IV SOLN: INTRAVENOUS | Status: DC

## 2021-03-16 MED ORDER — SODIUM CHLORIDE 0.9 % IV SOLN
INTRAVENOUS | Status: DC
Start: 2021-03-16 — End: 2021-03-17

## 2021-03-16 MED ORDER — DEXTROSE 50 % IV SOLN: 0.0000 mL | INTRAVENOUS | Status: DC | PRN

## 2021-03-16 MED ORDER — INSULIN GLARGINE 100 UNIT/ML ~~LOC~~ SOLN: 50.0000 [IU] | Freq: Every day | SUBCUTANEOUS | Status: DC

## 2021-03-16 MED ORDER — EZETIMIBE 10 MG PO TABS
10.0000 mg | ORAL_TABLET | Freq: Every day | ORAL | Status: DC
  Administered 2021-03-17 – 2021-04-08 (×23): 10 mg via ORAL
  Filled 2021-03-16 (×23): qty 1

## 2021-03-16 MED ORDER — INSULIN REGULAR(HUMAN) IN NACL 100-0.9 UT/100ML-% IV SOLN
INTRAVENOUS | Status: DC
  Administered 2021-03-16: 9.5 [IU]/h via INTRAVENOUS
  Filled 2021-03-16: qty 100

## 2021-03-16 MED ORDER — INSULIN GLARGINE 100 UNIT/ML ~~LOC~~ SOLN
40.0000 [IU] | Freq: Every day | SUBCUTANEOUS | Status: DC
  Filled 2021-03-16 (×2): qty 0.4

## 2021-03-16 MED ORDER — SODIUM CHLORIDE 0.9% FLUSH: 3.0000 mL | Freq: Two times a day (BID) | INTRAVENOUS | Status: DC

## 2021-03-16 MED ORDER — DEXTROSE 50 % IV SOLN
0.0000 mL | INTRAVENOUS | Status: DC | PRN
Start: 2021-03-16 — End: 2021-04-08

## 2021-03-16 MED ORDER — ONDANSETRON HCL 4 MG PO TABS
4.0000 mg | ORAL_TABLET | Freq: Four times a day (QID) | ORAL | Status: DC | PRN
Start: 2021-03-16 — End: 2021-04-05
  Administered 2021-03-29: 4 mg via ORAL
  Filled 2021-03-16: qty 1

## 2021-03-16 MED ORDER — CHLORHEXIDINE GLUCONATE CLOTH 2 % EX PADS
6.0000 | MEDICATED_PAD | Freq: Every day | CUTANEOUS | Status: DC
  Administered 2021-03-16 – 2021-04-08 (×24): 6 via TOPICAL

## 2021-03-16 MED ORDER — SODIUM CHLORIDE 0.9 % IV BOLUS
1000.0000 mL | INTRAVENOUS | Status: AC
  Administered 2021-03-16: 1000 mL via INTRAVENOUS

## 2021-03-16 NOTE — ED Provider Notes (Signed)
[facility] EMERGENCY DEPARTMENT Provider Note   CSN: [id number] Arrival date & time: 03/16/21  1244     History Chief Complaint  Patient presents with  . Emesis    Tracey Bryant is a 70 y.o. female with PMhx HTN, Diabetes, Cardiomyopathy, and Stroke on Eliquis who presents to the ED via EMS with complaint of generalized weakness, nausea, and vomiting for the past 2 days.  Per chart review patient presented to the ED yesterday with complaint of epistaxis.  She was also complaining of some nausea and intermittent emesis at that time.  She will initially treated with Afrin on cotton ball without success however second attempt with packing as well as Afrin with success.  She had lab work done at that time which did show a hemoglobin of 8.5 however previous labs were only from 2 years ago.  Bleeding was controlled and patient was monitored in the ED for over 1 hour after bleeding was controlled and she was discharged home.  She reports that this morning she woke up with abdominal pain and has continued to feel weak and fatigued.  She has not had any more bleeding from her nose.  She continues to feel nauseated however denies any more vomiting.  She denies any diarrhea.  She did have a bowel movement between her ED visit yesterday and today. Denies melena or BRBPR.  Patient has no other complaints at this time.  Does mention she was recently placed on insulin however has not been taking it.  She has continued to take her Janumet.  The history is provided by the patient, medical records and the EMS personnel.       Past Medical History:  Diagnosis Date  . Arthritis   . Cardiomyopathy ([place])   . Diabetes mellitus without complication ([place])   . Hypertension   . Stroke [facility])     Patient Active Problem List   Diagnosis Date Noted  . 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 Diabetes mellitus ([place]) 02/15/2013  . Hypertension 02/15/2013  . Gall bladder stones 02/15/2013  . Multiple lung  nodules 02/15/2013    Past Surgical History:  Procedure Laterality Date  . breast tumor    . PACEMAKER IMPLANT    . TONSILLECTOMY       OB History   No obstetric history on file.     Family History  Problem Relation Age of Onset  . Cancer Mother   . Diabetes Mother   . Heart disease Father     Social History   Tobacco Use  . Smoking status: Never Smoker  . Smokeless tobacco: Never Used  Vaping Use  . Vaping Use: Never used  Substance Use Topics  . Alcohol use: No  . Drug use: Never    Home Medications Prior to Admission medications   Medication Sig Start Date End Date Taking? Authorizing Provider  amLODipine-valsartan (EXFORGE) 10-160 MG per tablet Take 1 tablet by mouth daily.    [provider]  cloNIDine (CATAPRES) 0.1 MG tablet Take 0.1 mg by mouth 2 (two) times daily.    [provider]  insulin glargine (LANTUS) 100 UNIT/ML injection Inject 50 Units into the skin at bedtime.    [provider]  insulin NPH-regular (NOVOLIN 70/30) (70-30) 100 UNIT/ML injection Inject 10 Units into the skin 3 (three) times daily after meals.    [provider]  meloxicam (MOBIC) 15 MG tablet Take 15 mg by mouth daily.    [provider]  sitaGLIPtan-metformin (JANUMET) 50-1000 MG per tablet Take 1 tablet by mouth 2 (two) times daily with a meal.    [provider]    Allergies    Patient has no known allergies.  Review of Systems   Review of Systems  Constitutional: Negative for chills and fever.  Respiratory: Negative for shortness of breath.   Cardiovascular: Negative for chest pain.  Gastrointestinal: Positive for abdominal pain, nausea and vomiting. Negative for constipation and diarrhea.  Genitourinary: Negative for difficulty urinating, dysuria and flank pain.  All other systems reviewed and are negative.   Physical Exam Updated Vital Signs BP 111/60   Pulse 67   Temp 97.7 F (36.5 C) (Oral)   Resp 18   Ht  5\' 6"  (1.676 m)   Wt 75 kg   SpO2 98%   BMI 26.69 kg/m   Physical Exam Vitals and nursing note reviewed.  Constitutional:      Appearance: She is not ill-appearing.  HENT:     Head: Normocephalic and atraumatic.     Nose:     Comments: Dried blood surrounding nose and bilateral naris Eyes:     Conjunctiva/sclera: Conjunctivae normal.  Cardiovascular:     Rate and Rhythm: Normal rate and regular rhythm.     Pulses: Normal pulses.  Pulmonary:     Effort: Pulmonary effort is normal.     Breath sounds: Normal breath sounds. No wheezing, rhonchi or rales.  Abdominal:     Palpations: Abdomen is soft.     Tenderness: There is abdominal tenderness. There is no guarding or rebound.     Comments: Soft, + LLQ and suprapubic TTP, +BS throughout, no r/g/r, neg murphy's, neg mcburney's, no CVA TTP  Genitourinary:    Comments: Chaperone present for GU exam. Small external hemorrhoid, nonthrombosed, no bleeding appreciated to hemorrhoid. Light brown stool on gloved finger. Guaiac negative.  Musculoskeletal:     Cervical back: Neck supple.  Skin:    General: Skin is warm and dry.  Neurological:     Mental Status: She is alert.     ED Results / Procedures / Treatments   Labs (all labs ordered are listed, but only abnormal results are displayed) Labs Reviewed  COMPREHENSIVE METABOLIC PANEL - Abnormal; Notable for the following components:      Result Value   Sodium 134 (*)    Potassium 5.4 (*)    CO2 11 (*)    Glucose, Bld 416 (*)    BUN 62 (*)    Creatinine, Ser 2.28 (*)    Calcium 8.2 (*)    Total Protein 6.3 (*)    Albumin 2.9 (*)    AST 45 (*)    Alkaline Phosphatase 37 (*)    GFR, Estimated 23 (*)    Anion gap 17 (*)    All other components within normal limits  CBC WITH DIFFERENTIAL/PLATELET - Abnormal; Notable for the following components:   WBC 16.3 (*)    RBC 2.41 (*)    Hemoglobin 7.1 (*)    HCT 22.8 (*)    RDW 15.7 (*)    Neutro Abs 13.7 (*)    Abs Immature  Granulocytes 0.12 (*)    All other components within normal limits  BLOOD GAS, VENOUS - Abnormal; Notable for the following components:   pCO2, Ven 29.5 (*)    Bicarbonate 16.0 (*)    Acid-base deficit 9.9 (*)    All other components within normal limits  BETA-HYDROXYBUTYRIC ACID - Abnormal; Notable  for the following components:   Beta-Hydroxybutyric Acid 3.87 (*)    All other components within normal limits  CBG MONITORING, ED - Abnormal; Notable for the following components:   Glucose-Capillary 369 (*)    All other components within normal limits  CBG MONITORING, ED - Abnormal; Notable for the following components:   Glucose-Capillary 385 (*)    All other components within normal limits  SARS CORONAVIRUS 2 (TAT 6-24 HRS)  LIPASE, BLOOD  IRON AND TIBC  FERRITIN  URINALYSIS, ROUTINE W REFLEX MICROSCOPIC  POC OCCULT BLOOD, ED  POC OCCULT BLOOD, ED  TYPE AND SCREEN    EKG EKG Interpretation  Date/Time:  Monday March 16 2021 12:53:48 EDT Ventricular Rate:  67 PR Interval:    QRS Duration: 102 QT Interval:  445 QTC Calculation: 470 R Axis:   25 Text Interpretation: Sinus rhythm Anteroseptal infarct, old Repol abnrm suggests ischemia, lateral leads No old tracing to compare Confirmed by Sherwood Gambler [phone]) on 03/16/2021 2:18:08 PM   Radiology No results found.  Procedures .Critical Care Performed by: Eustaquio Maize, PA-C Authorized by: Eustaquio Maize, PA-C   Critical care provider statement:    Critical care time (minutes):  45   Critical care was necessary to treat or prevent imminent or life-threatening deterioration of the following conditions:  Metabolic crisis   Critical care was time spent personally by me on the following activities:  Discussions with consultants, evaluation of patient's response to treatment, examination of patient, ordering and performing treatments and interventions, ordering and review of laboratory studies, ordering and review of  radiographic studies, pulse oximetry, re-evaluation of patient's condition, obtaining history from patient or surrogate and review of old charts     Medications Ordered in ED Medications  insulin regular, human (MYXREDLIN) 100 units/ 100 mL infusion (9.5 Units/hr Intravenous New Bag/Given 03/16/21 1420)  dextrose 5 % in lactated ringers infusion (has no administration in time range)  dextrose 50 % solution 0-50 mL (has no administration in time range)  0.9 %  sodium chloride infusion (has no administration in time range)  sodium chloride 0.9 % bolus 1,000 mL (1,000 mLs Intravenous New Bag/Given 03/16/21 1333)  ondansetron (ZOFRAN) injection 4 mg (4 mg Intravenous Given 03/16/21 1333)  sodium chloride 0.9 % bolus 1,000 mL (1,000 mLs Intravenous New Bag/Given 03/16/21 1415)    ED Course  I have reviewed the triage vital signs and the nursing notes.  Pertinent labs & imaging results that were available during my care of the patient were reviewed by me and considered in my medical decision making (see chart for details).    MDM Rules/Calculators/A&P                          70 year old female who presents to the ED today with complaint of increased fatigue as well as nausea and new abdominal pain.  Seen yesterday for epistaxis and treated, monitored in the ED for 1 hour after bleeding ceased and discharged home.  She did have lab work done with a finding of a hemoglobin of 8.5 however previous labs were only from 2 years prior.  She is on Eliquis.  She denies continuation of bleeding.  On arrival to the ED today patient is afebrile, nontachycardic and nontachypneic.  She does hear generally fatigued.  She has dried blood to her nares.  Her shirt also has emesis on it which I suspect is the chart she was wearing yesterday.  She has left  lower quadrant and suprapubic tenderness palpation however states she feels like she has to urinate.  Question if her pain is from needing to urinate.  No rebound or  guarding to suggest acute abdomen at this time. CBG on arrival was 369.  She reports she was recently started on insulin however has not been taking it as she is not sure how to take it.  Concern for possible DKA at this time.  Will work-up for same.  Will provide fluids and Zofran.   CBC has returned with a hemoglobin of 7.1.  Down from 8.5 yesterday.  Rectal exam performed without any signs of bleeding, guaiac negative.  SPECT likely down from epistaxis yesterday.  And screen collected.  Patient will need admission with close monitoring of her hemoglobin however she is a [employer] and declines blood transfusion. WBC is also elevated at 16.3 today however was 13.5 yesterday (suspect elevation s/2 dehydration).  CMP with glucose 416, bicarb 11, and a gap of 17. VBG with normal pH 7.325 however labs do appear consistent with DKA today. Will start on endotool. Unfortunately fluids will continue to bring hgb down. Creatinine also elevated today (2.28 from 1.68 yesterday).   Beta hydroxybutyric acid 3.87 Lipase 32  Pt has been started on Endotool. Will consult for admission at this time.   Discussed case with Dr. Roger Shelter [facility] who agrees to accept patient for admission.   This note was prepared using Dragon voice recognition software and may include unintentional dictation errors due to the inherent limitations of voice recognition software.  Final Clinical Impression(s) / ED Diagnoses Final diagnoses:  Diabetic ketoacidosis without coma associated with type 2 diabetes mellitus ([place])  AKI (acute kidney injury) ([place])  Anemia, unspecified type    Rx / DC Orders ED Discharge Orders    None       Eustaquio Maize, PA-C 03/16/21 1434    Sherwood Gambler, MD 03/17/21 [phone]

## 2021-03-16 NOTE — ED Triage Notes (Signed)
Pt brought to ED via [facility] for nausea and emesis x 2. Pt has had decreased intake last 2 days.

## 2021-03-16 NOTE — H&P (Signed)
History and Physical   Patient: Tracey Bryant                            PCP: Coolidge Breeze, FNP                    DOB: 1951-05-22            DOA: 03/16/2021 [id number]:[id number]             DOS: 03/16/2021, 2:50 PM  Patient coming from:   HOME  I have personally reviewed patient's medical records, in electronic medical records, including:  [facility], and care everywhere.    Chief Complaint:   Chief Complaint  Patient presents with  . Emesis    History of present illness:    Tracey Bryant is a 70 y.o. female with medical history significant of   DM 2, cardiomyopathy, CVA on Eliquis, HTN, presented with generalized weakness, nausea vomiting x2 days.  Patient presented yesterday with epistaxis to ED, he was treated with Afrin, cottonball bleeding had stopped patient was discharged home.  Hemoglobin 8.5 around baseline yesterday.  Her nosebleeds had stopped  Reporting this morning she started having symptoms of nausea vomiting, abdominal pain weakness and fatigue.    Patient Denies having: Fever, Chills, Cough, SOB, Chest Pain, Abd pain, N/V/D, headache, dizziness, lightheadedness,  Dysuria, Joint pain, rash, open wounds, denies any rectal bleed or dark bloody stool.  Was recently started on insulin but she has not been taking it, she is only been taking her Janumet. She believes that she takes Eliquis for CVA prevention. Also reports that she is Jehovah's Witness would not like to receive blood transfusion   ED Course:  Vitals: Without acute stable 97.7, 63, 16, 110/63, satting 99% on room air Abnormal labs; sodium 134, potassium 5.4, blood sugar 416, BUN 62, creatinine 2.28, anion gap 17, alk phos 17, albumin 2.9, AST 45, C WBC 16.3, hemoglobin 7.1, hematocrit 22.8 Beta hydroxybutyrate 3.87  Was requested patient to be admitted for diabetic ketoacidosis  Review of Systems: As per HPI, otherwise 10 point review of systems were negative.    ----------------------------------------------------------------------------------------------------------------------  No Known Allergies  Home MEDs:  Prior to Admission medications   Medication Sig Start Date End Date Taking? Authorizing Provider  amLODipine-valsartan (EXFORGE) 10-160 MG per tablet Take 1 tablet by mouth daily.    [provider]  cloNIDine (CATAPRES) 0.1 MG tablet Take 0.1 mg by mouth 2 (two) times daily.    [provider]  insulin glargine (LANTUS) 100 UNIT/ML injection Inject 50 Units into the skin at bedtime.    [provider]  insulin NPH-regular (NOVOLIN 70/30) (70-30) 100 UNIT/ML injection Inject 10 Units into the skin 3 (three) times daily after meals.    [provider]  meloxicam (MOBIC) 15 MG tablet Take 15 mg by mouth daily.    [provider]  sitaGLIPtan-metformin (JANUMET) 50-1000 MG per tablet Take 1 tablet by mouth 2 (two) times daily with a meal.    [provider]    PRN MEDs: sodium chloride, acetaminophen **OR** acetaminophen, bisacodyl, dextrose, hydrALAZINE, HYDROmorphone (DILAUDID) injection, ipratropium, levalbuterol, magnesium citrate, ondansetron **OR** ondansetron (ZOFRAN) IV, oxyCODONE, senna-docusate, sodium chloride flush, traZODone  Past Medical History:  Diagnosis Date  . Arthritis   . Cardiomyopathy ([place])   . Diabetes mellitus without complication ([place])   . Hypertension   . Stroke [facility])  Past Surgical History:  Procedure Laterality Date  . breast tumor    . PACEMAKER IMPLANT    . TONSILLECTOMY       reports that she has never smoked. She has never used smokeless tobacco. She reports that she does not drink alcohol and does not use drugs.   Family History  Problem Relation Age of Onset  . Cancer Mother   . Diabetes Mother   . Heart disease Father     Physical Exam:   Vitals:   03/16/21 1248 03/16/21 1300 03/16/21 1330 03/16/21 1400  BP:  111/60 112/63  110/63  Pulse:  67 65 63  Resp:  $Remo'18 11 16  'ACfXR$ Temp:  97.7 F (36.5 C)    TempSrc:  Oral    SpO2:  98% 99% 99%  Weight: 75 kg     Height: $Remove'5\' 6"'JHJnxVW$  (1.676 m)      Constitutional: NAD, calm, comfortable Eyes: PERRL, lids and conjunctivae normal ENMT: Mucous membranes are moist. Posterior pharynx clear of any exudate or lesions.Normal dentition.  Neck: normal, supple, no masses, no thyromegaly Respiratory: clear to auscultation bilaterally, no wheezing, no crackles. Normal respiratory effort. No accessory muscle use.  Cardiovascular: Regular rate and rhythm, no murmurs / rubs / gallops. No extremity edema. 2+ pedal pulses. No carotid bruits.  Abdomen: no tenderness, no masses palpated. No hepatosplenomegaly. Bowel sounds positive.  Musculoskeletal: no clubbing / cyanosis. No joint deformity upper and lower extremities. Good ROM, no contractures. Normal muscle tone.  Neurologic: CN II-XII grossly intact. Sensation intact, DTR normal. Strength 5/5 in all 4.  Psychiatric: Normal judgment and insight. Alert and oriented x 3. Normal mood.  Skin: no rashes, lesions, ulcers. No induration Decubitus/ulcers:  Wounds: per nursing documentation      Labs on admission:    I have personally reviewed following labs and imaging studies  CBC: Recent Labs  Lab 03/15/21 1316 03/16/21 1300  WBC 13.8* 16.3*  NEUTROABS 10.7* 13.7*  HGB 8.5* 7.1*  HCT 26.8* 22.8*  MCV 92.4 94.6  PLT 323 494   Basic Metabolic Panel: Recent Labs  Lab 03/15/21 1316 03/16/21 1300  NA 135 134*  K 4.3 5.4*  CL 107 106  CO2 17* 11*  GLUCOSE 319* 416*  BUN 54* 62*  CREATININE 1.68* 2.28*  CALCIUM 8.9 8.2*   GFR: Estimated Creatinine Clearance: 24.1 mL/min (A) (by C-G formula based on SCr of 2.28 mg/dL (H)). Liver Function Tests: Recent Labs  Lab 03/16/21 1300  AST 45*  ALT 29  ALKPHOS 37*  BILITOT 1.0  PROT 6.3*  ALBUMIN 2.9*   Recent Labs  Lab 03/16/21 1300  LIPASE 32   CBG: Recent Labs  Lab  03/16/21 1256 03/16/21 1416  GLUCAP 369* 385*   Lipid Profile: No results for input(s): CHOL, HDL, LDLCALC, TRIG, CHOLHDL, LDLDIRECT in the last 72 hours. Thyroid Function Tests: No results for input(s): TSH, T4TOTAL, FREET4, T3FREE, THYROIDAB in the last 72 hours. Anemia Panel: Recent Labs    03/16/21 1327  FERRITIN 99  TIBC 374  IRON 54   Urine analysis:    Component Value Date/Time   COLORURINE YELLOW 01/17/2013 1910   APPEARANCEUR CLEAR 01/17/2013 1910   LABSPEC 1.025 01/17/2013 1910   PHURINE 5.5 01/17/2013 1910   GLUCOSEU 500 (A) 01/17/2013 1910   HGBUR MODERATE (A) 01/17/2013 1910   BILIRUBINUR NEGATIVE 01/17/2013 1910   KETONESUR >80 (A) 01/17/2013 1910   PROTEINUR 100 (A) 01/17/2013 1910   UROBILINOGEN 0.2 01/17/2013 1910  NITRITE NEGATIVE 01/17/2013 1910   LEUKOCYTESUR NEGATIVE 01/17/2013 1910     Radiologic Exams on Admission:   No results found.  EKG:   Independently reviewed.  Orders placed or performed during the hospital encounter of 03/16/21  . ED EKG  . ED EKG  . EKG 12-Lead  . EKG 12-Lead  . 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  Uncontrolled  diabetes mellitus type 2 -Her A1c was around 11 on February 2022 -Rechecking her A1c -She has not started her home insulin regimen:  Records indicate she is supposed to be on Lantus 50 units, glipizide 5 mg daily -We will check her blood sugar closely, and calculate new basal Anticipating new insulin regimen before discharge   Acute posthemorrhagic anemia -status post epistaxis in the setting of chronic anticoagulation with Eliquis -Monitoring H&H closely, -Her hemoglobin dropped from 8.5 to 7.1 today -Patient stating she is Jehovah's Witness she would not like to receive any blood transfusion  AKI (acute kidney injury) ([place]) -Trend kidney function closely -Hoping to improve as we treat diabetic ketoacidosis  Epistaxis -in the setting of chronic anticoagulation with Eliquis -On 03/15/2021 in ED -leading has stopped, status post treatment with Afrin-we will utilize as needed -We will holding Eliquis for today -Monitoring H&H  Leukocytosis -Likely reactive, UA chest x-ray clear -no signs of infection -Monitor closely, -Withholding antibiotics at this time   Active Problems:  Rheumatoid arteritis (HCC) -stable  History of hypertension -today mildly hypotensive -monitor closely, not on any home medication 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[place]) -chronic iron studies within normal limits  Chronic anticoagulation-on Eliquis for CVA prevention  CVA (cerebral vascular accident) ([place]) x3 per patient -We will monitor closely if stop bleeding, Eliquis with be started on later date, Any statins, doing her Zetia  History of congestive heart failure, dysrhythmia on defibrillator Per patient and her son at bedside cardiovascular she has been stable and thus the reason she is on Lasix and Eliquis  Cultures:  -none  Antimicrobial: -noe   Consults called:   None -------------------------------------------------------------------------------------------------------------------------------------------- DVT prophylaxis: SCD/Compression stockings Code Status:   Code Status: Full Code   Admission status:  Patient will be admitted as Inpatient, with a greater than 2 midnight length of stay. Level of care: ICU   Family Communication: Patient is awake alert, son present at bedside Plan of care was discussed with the patient and her son at the bedside. It was discussed that her current condition is critical... The patient and her son expressed understanding of current finding and plan of care.  Agreed that she does not like to be transfused with PRBC. But confirmed full CODE STATUS   ---------------------------------------------------------------------------------------------------------------------------------------------  Disposition Plan:  Anticipated 1-2 days Status is: Inpatient  Remains inpatient appropriate because:Hemodynamically unstable, Persistent severe electrolyte disturbances and Inpatient level of care appropriate due to severity of illness  Dispo: The patient is from: Home              Anticipated d/c is to: Home              Patient currently is not medically stable to d/c.   Difficult to place patient No    ----------------------------------------------------------------------------------------------------------------------------------------------------  Time spent: > than  55  Min.   SIGNED: Deatra James, MD, FHM. [facility],  Pager (Please use [email or web address] to page to text)  If 7PM-7AM, please contact night-coverage [email or web address],  03/16/2021, 2:50 PM

## 2021-03-17 ENCOUNTER — Inpatient Hospital Stay (HOSPITAL_COMMUNITY): Payer: Medicare HMO

## 2021-03-17 DIAGNOSIS — D62 Acute posthemorrhagic anemia: Secondary | ICD-10-CM | POA: Diagnosis not present

## 2021-03-17 DIAGNOSIS — N179 Acute kidney failure, unspecified: Secondary | ICD-10-CM | POA: Diagnosis not present

## 2021-03-17 DIAGNOSIS — E1110 Type 2 diabetes mellitus with ketoacidosis without coma: Secondary | ICD-10-CM | POA: Diagnosis not present

## 2021-03-17 DIAGNOSIS — E1169 Type 2 diabetes mellitus with other specified complication: Secondary | ICD-10-CM | POA: Diagnosis not present

## 2021-03-17 LAB — CBC
HCT: 20.9 % — ABNORMAL LOW (ref 36.0–46.0)
Hemoglobin: 6.5 g/dL — CL (ref 12.0–15.0)
MCH: 29.5 pg (ref 26.0–34.0)
MCHC: 31.1 g/dL (ref 30.0–36.0)
MCV: 95 fL (ref 80.0–100.0)
Platelets: 293 10*3/uL (ref 150–400)
RBC: 2.2 MIL/uL — ABNORMAL LOW (ref 3.87–5.11)
RDW: 16.1 % — ABNORMAL HIGH (ref 11.5–15.5)
WBC: 21.9 10*3/uL — ABNORMAL HIGH (ref 4.0–10.5)
nRBC: 0.1 % (ref 0.0–0.2)

## 2021-03-17 LAB — GLUCOSE, CAPILLARY
Glucose-Capillary: 157 mg/dL — ABNORMAL HIGH (ref 70–99)
Glucose-Capillary: 164 mg/dL — ABNORMAL HIGH (ref 70–99)
Glucose-Capillary: 167 mg/dL — ABNORMAL HIGH (ref 70–99)
Glucose-Capillary: 171 mg/dL — ABNORMAL HIGH (ref 70–99)
Glucose-Capillary: 176 mg/dL — ABNORMAL HIGH (ref 70–99)
Glucose-Capillary: 182 mg/dL — ABNORMAL HIGH (ref 70–99)
Glucose-Capillary: 184 mg/dL — ABNORMAL HIGH (ref 70–99)
Glucose-Capillary: 188 mg/dL — ABNORMAL HIGH (ref 70–99)
Glucose-Capillary: 215 mg/dL — ABNORMAL HIGH (ref 70–99)
Glucose-Capillary: 336 mg/dL — ABNORMAL HIGH (ref 70–99)
Glucose-Capillary: 365 mg/dL — ABNORMAL HIGH (ref 70–99)

## 2021-03-17 LAB — URINALYSIS, ROUTINE W REFLEX MICROSCOPIC
Bilirubin Urine: NEGATIVE
Glucose, UA: 50 mg/dL — AB
Ketones, ur: NEGATIVE mg/dL
Nitrite: NEGATIVE
Protein, ur: >=300 mg/dL — AB
Specific Gravity, Urine: 1.015 (ref 1.005–1.030)
WBC, UA: >50 WBC/hpf — ABNORMAL HIGH (ref 0–5)
pH: 5 (ref 5.0–8.0)

## 2021-03-17 LAB — BASIC METABOLIC PANEL
Anion gap: 8 (ref 5–15)
Anion gap: 10 (ref 5–15)
Anion gap: 10 (ref 5–15)
BUN: 62 mg/dL — ABNORMAL HIGH (ref 8–23)
BUN: 64 mg/dL — ABNORMAL HIGH (ref 8–23)
BUN: 66 mg/dL — ABNORMAL HIGH (ref 8–23)
CO2: 17 mmol/L — ABNORMAL LOW (ref 22–32)
CO2: 17 mmol/L — ABNORMAL LOW (ref 22–32)
CO2: 17 mmol/L — ABNORMAL LOW (ref 22–32)
Calcium: 7.7 mg/dL — ABNORMAL LOW (ref 8.9–10.3)
Calcium: 7.8 mg/dL — ABNORMAL LOW (ref 8.9–10.3)
Calcium: 7.7 mg/dL — ABNORMAL LOW (ref 8.9–10.3)
Chloride: 110 mmol/L (ref 98–111)
Chloride: 110 mmol/L (ref 98–111)
Chloride: 109 mmol/L (ref 98–111)
Creatinine, Ser: 2.4 mg/dL — ABNORMAL HIGH (ref 0.44–1.00)
Creatinine, Ser: 2.41 mg/dL — ABNORMAL HIGH (ref 0.44–1.00)
Creatinine, Ser: 2.5 mg/dL — ABNORMAL HIGH (ref 0.44–1.00)
GFR, Estimated: 21 mL/min — ABNORMAL LOW (ref >60)
GFR, Estimated: 21 mL/min — ABNORMAL LOW (ref >60)
GFR, Estimated: 20 mL/min — ABNORMAL LOW (ref >60)
Glucose, Bld: 204 mg/dL — ABNORMAL HIGH (ref 70–99)
Glucose, Bld: 184 mg/dL — ABNORMAL HIGH (ref 70–99)
Glucose, Bld: 171 mg/dL — ABNORMAL HIGH (ref 70–99)
Potassium: 4.8 mmol/L (ref 3.5–5.1)
Potassium: 4.7 mmol/L (ref 3.5–5.1)
Potassium: 4.3 mmol/L (ref 3.5–5.1)
Sodium: 135 mmol/L (ref 135–145)
Sodium: 137 mmol/L (ref 135–145)
Sodium: 136 mmol/L (ref 135–145)

## 2021-03-17 LAB — MRSA PCR SCREENING: MRSA by PCR: NEGATIVE

## 2021-03-17 LAB — LACTIC ACID, PLASMA
Lactic Acid, Venous: 2.5 mmol/L (ref 0.5–1.9)
Lactic Acid, Venous: 4.4 mmol/L (ref 0.5–1.9)

## 2021-03-17 LAB — HEPATIC FUNCTION PANEL
ALT: 222 U/L — ABNORMAL HIGH (ref 0–44)
AST: 248 U/L — ABNORMAL HIGH (ref 15–41)
Albumin: 2.7 g/dL — ABNORMAL LOW (ref 3.5–5.0)
Alkaline Phosphatase: 34 U/L — ABNORMAL LOW (ref 38–126)
Bilirubin, Direct: 0.1 mg/dL (ref 0.0–0.2)
Indirect Bilirubin: 0.4 mg/dL (ref 0.3–0.9)
Total Bilirubin: 0.5 mg/dL (ref 0.3–1.2)
Total Protein: 5.8 g/dL — ABNORMAL LOW (ref 6.5–8.1)

## 2021-03-17 LAB — ABO/RH: ABO/RH(D): A POS

## 2021-03-17 LAB — BETA-HYDROXYBUTYRIC ACID: Beta-Hydroxybutyric Acid: 0.05 mmol/L (ref 0.05–0.27)

## 2021-03-17 LAB — PROTIME-INR
INR: 1.2 (ref 0.8–1.2)
Prothrombin Time: 14.8 seconds (ref 11.4–15.2)

## 2021-03-17 LAB — SARS CORONAVIRUS 2 (TAT 6-24 HRS): SARS Coronavirus 2: NEGATIVE

## 2021-03-17 LAB — PROCALCITONIN: Procalcitonin: 0.31 ng/mL

## 2021-03-17 IMAGING — CT CT ABD-PELV W/O CM
2 of 4 series · 16 of 46 positions shown, 18 images · non-contrast
Comparison: [DATE]

CLINICAL DATA: Nonlocalized abdominal pain. Increased lactic acid.
Lethargy. Confusion.

EXAM:
CT ABDOMEN AND PELVIS WITHOUT CONTRAST
TECHNIQUE: Multidetector CT imaging of the abdomen and pelvis was performed
following the standard protocol without IV contrast.

[Series 2: axial st · axial · 0.91mm/px · z∈[-510,-105]mm · 13 of 93 slices shown, 15 images]
[im 6/93  soft-tissue]
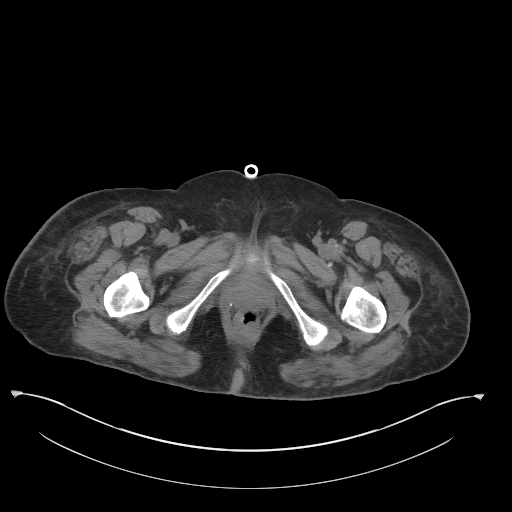
[im 6/93  bone]
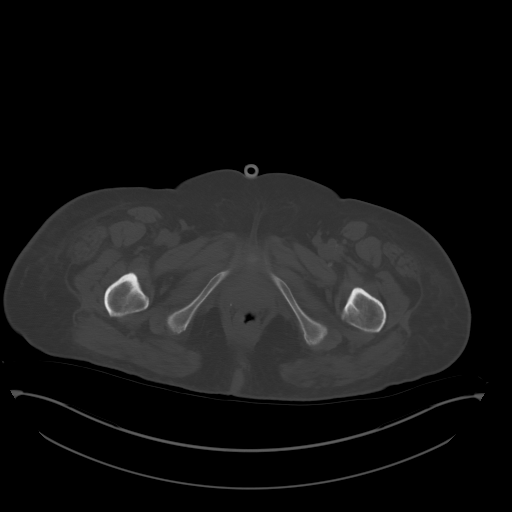
[im 12/93  soft-tissue]
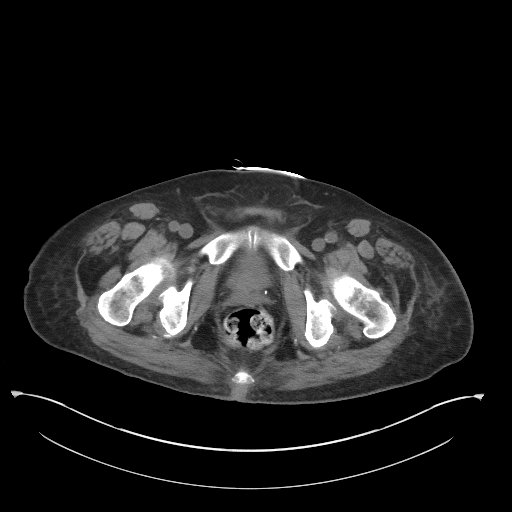
[im 18/93  soft-tissue]
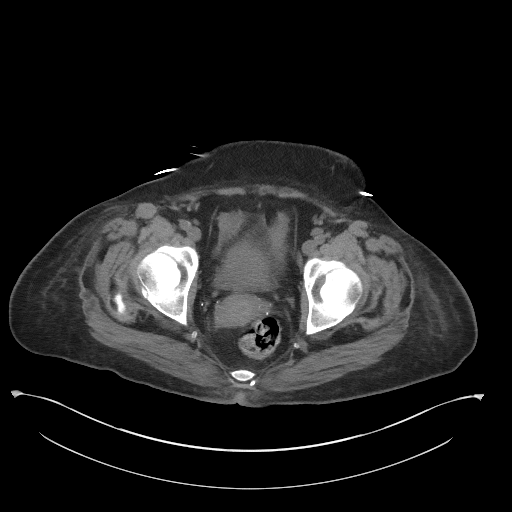
[im 29/93  soft-tissue]
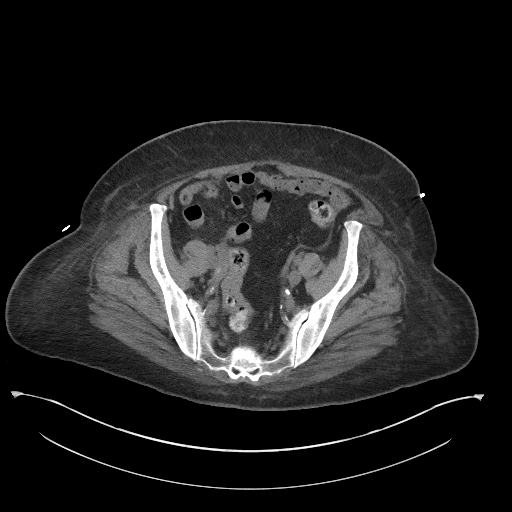
[im 35/93  soft-tissue]
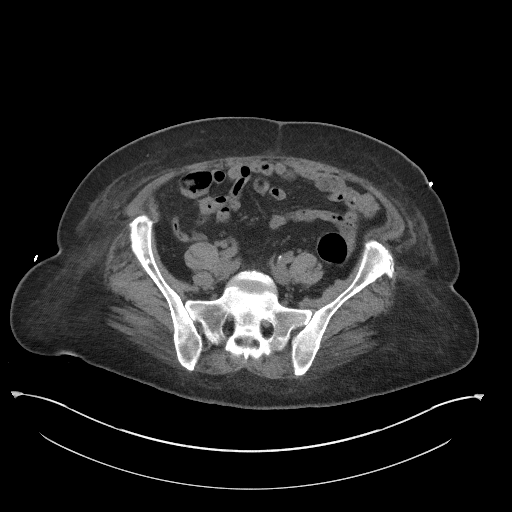
[im 41/93  soft-tissue]
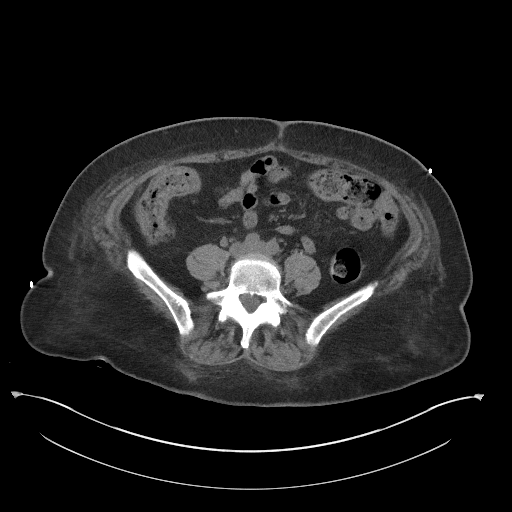
[im 47/93  soft-tissue]
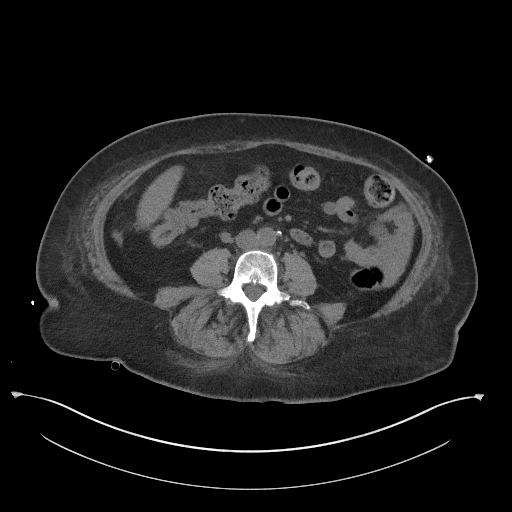
[im 52/93  soft-tissue]
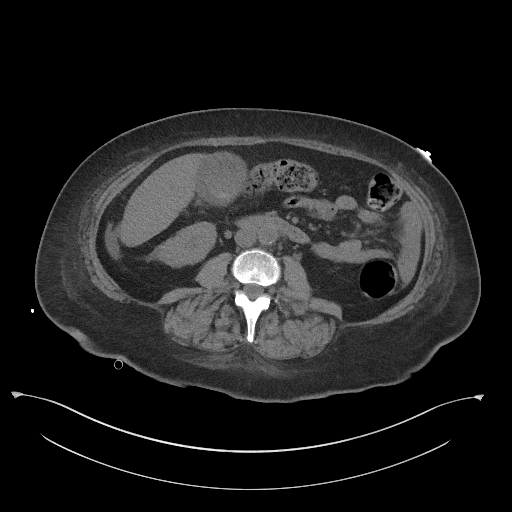
[im 58/93  soft-tissue]
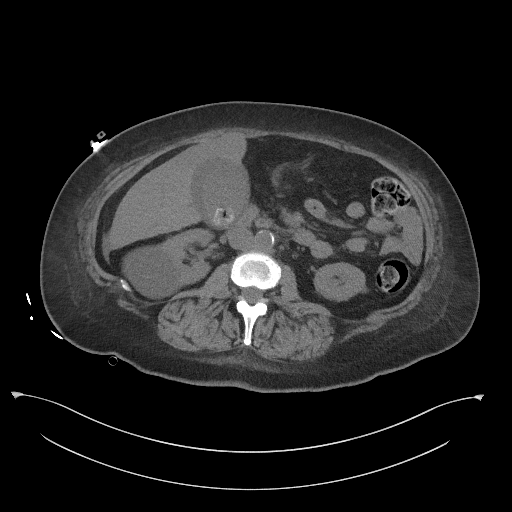
[im 58/93  bone]
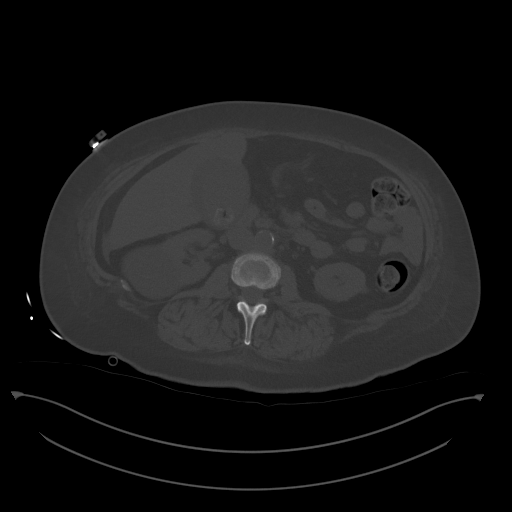
[im 64/93  soft-tissue]
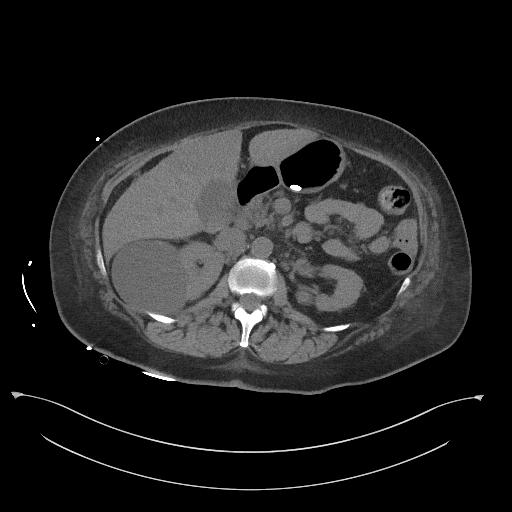
[im 75/93  soft-tissue]
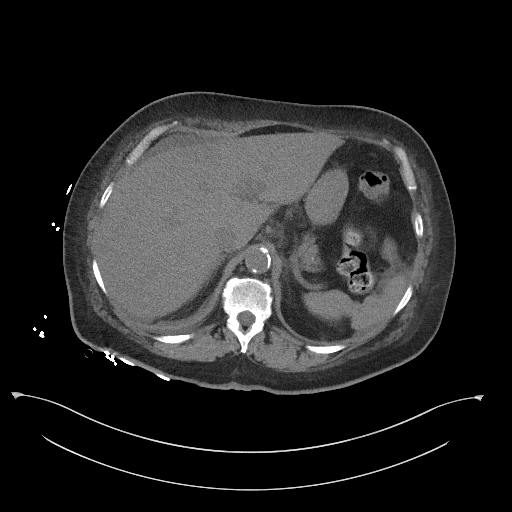
[im 81/93  soft-tissue]
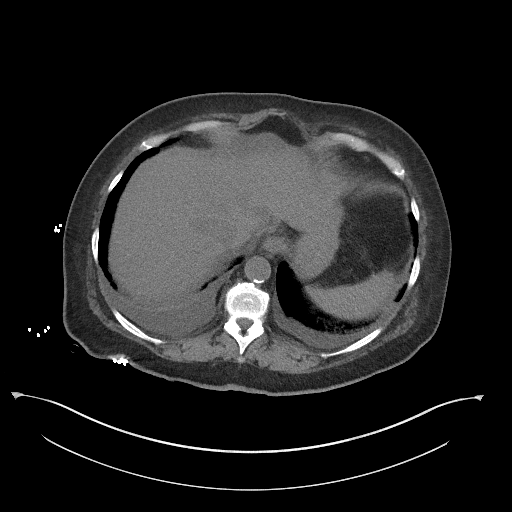
[im 87/93  soft-tissue]
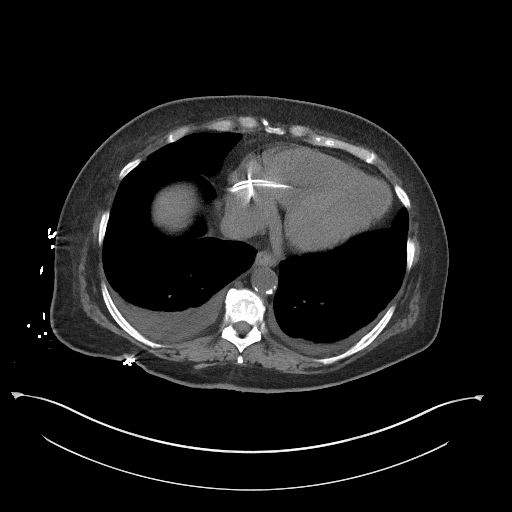

[Series 5: coronal st · coronal · 0.85mm/px · 3 of 103 slices shown]
[im 35/103  soft-tissue]
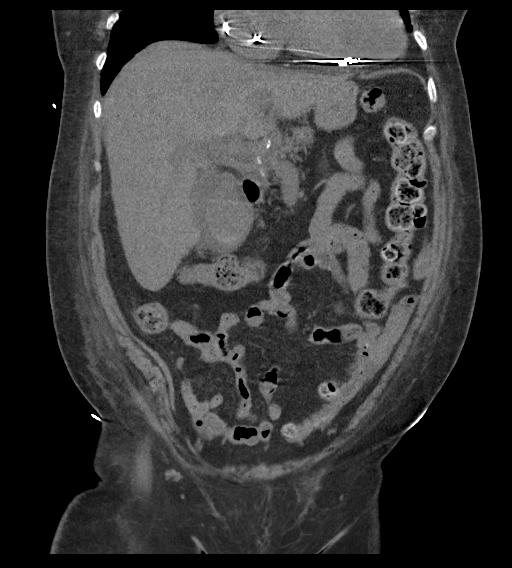
[im 46/103  soft-tissue]
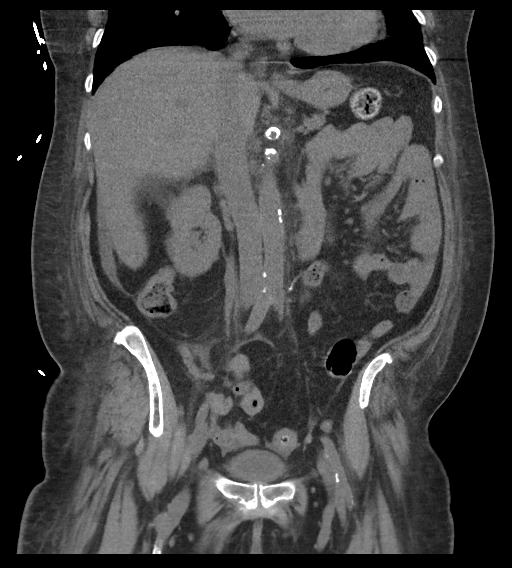
[im 57/103  soft-tissue]
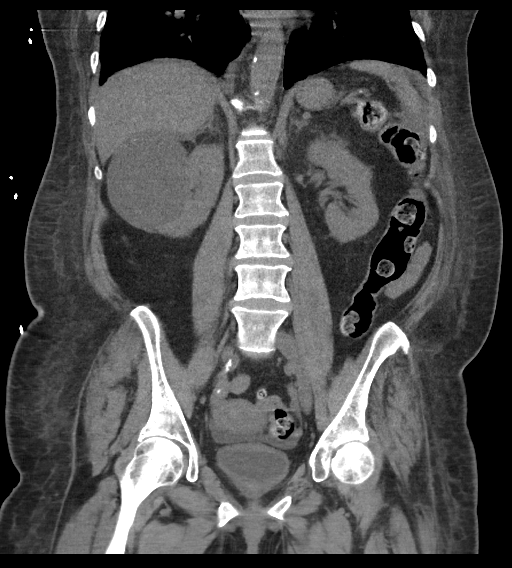

[16 of 46 positions shown; findings below may reference images not displayed]

FINDINGS: Lower chest: Left lower lobe 9 mm pulmonary nodule on [DATE] is
similar in [ZP] and considered benign. Bibasilar subsegmental
atelectasis. Mild cardiomegaly with pacer. Distal right coronary
artery calcification. Small bilateral pleural effusions.

Hepatobiliary: Small volume perihepatic ascites. Subtle
hypoattenuation involving the anterior portion of segment 4 B,
including on [DATE]. On the order of 5.0 cm.

2.0 cm gallstone with gallbladder wall thickening up to 9 mm. No
biliary duct dilatation.

Pancreas: Fatty replacement involving the pancreatic head and
uncinate process.

Spleen: Normal in size, without focal abnormality.

Adrenals/Urinary Tract: Normal adrenal glands. Mild renal cortical
thinning bilaterally. Interpolar right renal exophytic 7.4 cm fluid
density lesion with calcification in its wall. Trace air within the
nondependent bladder including on 79/2. No ureteric or bladder
calculi.

Stomach/Bowel: Proximal gastric underdistention. Colonic stool
burden suggests constipation. Normal terminal ileum and appendix.
Normal small bowel.

Vascular/Lymphatic: Advanced aortic and branch vessel
atherosclerosis. Retroaortic left renal vein. No abdominopelvic
adenopathy.

Reproductive: Normal uterus and adnexa.

Other: Small volume abdominopelvic fluid including adjacent the
liver and spleen.

Musculoskeletal: Mild anasarca. Remote eighth posterior left rib
fracture.
IMPRESSION: 1. Cholelithiasis with gallbladder wall thickening. Correlate with
right upper quadrant symptoms and possibly ultrasound to exclude
acute cholecystitis.
2.  Possible constipation.
3. Bilateral small pleural effusions and abdominopelvic ascites with
anasarca. Question fluid overload.
4. Coronary artery atherosclerosis. Aortic Atherosclerosis
([ZP]-[ZP]).
5. Segment 4 B hypoattenuation could represent prominent focal
steatosis but is indeterminate. Consider nonemergent, outpatient pre
and post contrast abdominal MRI.
6. Trace air within the bladder.  Correlate with instrumentation.

## 2021-03-17 MED ORDER — FUROSEMIDE 40 MG PO TABS
40.0000 mg | ORAL_TABLET | Freq: Every day | ORAL | Status: DC
  Administered 2021-03-18: 40 mg via ORAL
  Filled 2021-03-17: qty 1

## 2021-03-17 MED ORDER — INSULIN GLARGINE 100 UNIT/ML ~~LOC~~ SOLN
25.0000 [IU] | Freq: Every day | SUBCUTANEOUS | Status: DC
  Filled 2021-03-17: qty 0.25

## 2021-03-17 MED ORDER — LACTATED RINGERS IV SOLN: INTRAVENOUS | Status: AC

## 2021-03-17 MED ORDER — LACTATED RINGERS IV SOLN: INTRAVENOUS | Status: DC

## 2021-03-17 MED ORDER — METOCLOPRAMIDE HCL 5 MG/ML IJ SOLN
10.0000 mg | Freq: Three times a day (TID) | INTRAMUSCULAR | Status: DC
  Administered 2021-03-17 – 2021-03-25 (×25): 10 mg via INTRAVENOUS
  Filled 2021-03-17 (×25): qty 2

## 2021-03-17 MED ORDER — INSULIN ASPART 100 UNIT/ML ~~LOC~~ SOLN
0.0000 [IU] | Freq: Three times a day (TID) | SUBCUTANEOUS | Status: DC
  Administered 2021-03-17: 9 [IU] via SUBCUTANEOUS
  Administered 2021-03-17: 7 [IU] via SUBCUTANEOUS
  Administered 2021-03-17: 3 [IU] via SUBCUTANEOUS

## 2021-03-17 MED ORDER — PIPERACILLIN-TAZOBACTAM 3.375 G IVPB
3.3750 g | Freq: Three times a day (TID) | INTRAVENOUS | Status: DC
  Administered 2021-03-17 – 2021-03-18 (×3): 3.375 g via INTRAVENOUS
  Filled 2021-03-17 (×3): qty 50

## 2021-03-17 MED ORDER — INSULIN STARTER KIT- PEN NEEDLES (ENGLISH)
1.0000 | Freq: Once | Status: AC
  Administered 2021-03-18: 1
  Filled 2021-03-17: qty 1

## 2021-03-17 MED ORDER — INSULIN GLARGINE 100 UNIT/ML ~~LOC~~ SOLN
25.0000 [IU] | Freq: Every evening | SUBCUTANEOUS | Status: DC
  Administered 2021-03-17 – 2021-03-19 (×3): 25 [IU] via SUBCUTANEOUS
  Filled 2021-03-17 (×5): qty 0.25

## 2021-03-17 MED ORDER — FERROUS SULFATE 325 (65 FE) MG PO TABS
325.0000 mg | ORAL_TABLET | Freq: Two times a day (BID) | ORAL | Status: AC
  Administered 2021-03-17 – 2021-03-21 (×9): 325 mg via ORAL
  Filled 2021-03-17 (×9): qty 1

## 2021-03-17 MED ORDER — TBO-FILGRASTIM 300 MCG/0.5ML ~~LOC~~ SOSY
300.0000 ug | PREFILLED_SYRINGE | Freq: Once | SUBCUTANEOUS | Status: AC
  Administered 2021-03-17: 300 ug via SUBCUTANEOUS
  Filled 2021-03-17: qty 0.5

## 2021-03-17 NOTE — TOC Initial Note (Signed)
Transition of Care [facility]) - Initial/Assessment Note    Patient Details  Name: Tracey Bryant MRN: [id number] Date of Birth: 1951/03/20  [facility] CM/SW Contact:    Shade Flood, LCSW Phone Number: 03/17/2021, 11:58 AM  Clinical Narrative:                  Pt admitted from home. She lives alone. PT recommending SNF at dc. Met with pt briefly today to discuss. Pt expressed feeling very nauseated at the time of TOC visit today. She also states that she needs to urinate but that she cannot. Pt says, "I feel so sick"   Discussed PT recommendation and pt stated that the last time they tried to get her in to SNF, her insurance wouldn't pay. Asked pt if she would like [place] to try again and she says that she is too sick to decide right now. Agreed with pt that TOC would return when she is feeling better.  Updated RN on pt's physical symptoms as described above.  Expected Discharge Plan: Skilled Nursing Facility Barriers to Discharge: Continued Medical Work up   Patient Goals and CMS Choice Patient states their goals for this hospitalization and ongoing recovery are:: get better      Expected Discharge Plan and Services Expected Discharge Plan: [place] In-house Referral: Clinical Social Work     Living arrangements for the past 2 months: Single Family Home                                      Prior Living Arrangements/Services Living arrangements for the past 2 months: Single Family Home Lives with:: Self Patient language and need for interpreter reviewed:: Yes Do you feel safe going back to the place where you live?: Yes      Need for Family Participation in Patient Care: No (Comment) Care giver support system in place?: No (comment)   Criminal Activity/Legal Involvement Pertinent to Current Situation/Hospitalization: No - Comment as needed  Activities of Daily Living      Permission Sought/Granted                  Emotional  Assessment Appearance:: Appears stated age Attitude/Demeanor/Rapport: Lethargic Affect (typically observed): Pleasant Orientation: : Oriented to Self,Oriented to Place,Oriented to  Time,Oriented to Situation Alcohol / Substance Use: Not Applicable Psych Involvement: No (comment)  Admission diagnosis:  DKA (diabetic ketoacidosis) ([place]) [E11.10] AKI (acute kidney injury) ([place]) [N17.9] Diabetic ketoacidosis without coma associated with type 2 diabetes mellitus ([place]) [E11.10] Anemia, unspecified type [D64.9] Patient Active Problem List   Diagnosis Date Noted  . DKA (diabetic ketoacidosis) ([place]) 03/16/2021  . AKI (acute kidney injury) ([place]) 03/16/2021  . 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[place]) 03/16/2021  . Acute posthemorrhagic anemia 03/16/2021  . Epistaxis 03/16/2021  . Chronic anticoagulation 03/16/2021  . CVA (cerebral vascular accident) ([place]) 03/16/2021  . Rheumatoid arteritis ([place]) 02/15/2013  . Diabetes mellitus ([place]) 02/15/2013  . Hypertension 02/15/2013  . Gall bladder stones 02/15/2013  . Multiple lung nodules 02/15/2013   PCP:  Coolidge Breeze, FNP Pharmacy:   [facility] [address] [place] [id number] [place] [address] [place] [address] Phone: [phone] Fax: [phone]     Social Determinants of Health (SDOH) Interventions    Readmission Risk Interventions Readmission Risk Prevention Plan 03/17/2021  Transportation Screening Complete  Medication Review (RN CM) Complete  Some recent data might be hidden

## 2021-03-17 NOTE — Progress Notes (Signed)
Critical lab value called from lab.  Patient's lactic acid 2.5 Dr. Roger Shelter made aware; at bedside

## 2021-03-17 NOTE — Progress Notes (Signed)
Per patient and son, the following people can visit: Sheron Nightingale

## 2021-03-17 NOTE — CV Procedure (Signed)
Echo attempted 2 times patient too nauseated and sick to do test at this time.Will try again later.

## 2021-03-17 NOTE — Evaluation (Signed)
Physical Therapy Evaluation Patient Details Name: Tracey Bryant MRN: [id number] DOB: 11-26-1951 Today's Date: 03/17/2021   History of Present Illness  Tracey Bryant is a 70 y.o. female with medical history significant of diabetes, cardiomyopathy, CVA, HTN, with current c/o generalized weakness, nausea vomiting x2 days; pt in ED with epistaxis on 03/15/21, and d/c home after treated and nosebleeds stopped.  Clinical Impression  Pt admitted with above diagnosis. Pt slightly disoriented during eval, unsure of accuracy of PLOF. Pt states baseline she is independent with rollator, performs bathing, dressing and light cooking and son assists with cooking, cleaning and driving. Pt states recent hospitalization requiring her to move in with son to regain strength. Pt's son works in [place] during the day so she was home alone at that time. Pt currently requiring min A +2 with transfers due to increased dizziness, also unable to take steps. RN notified of dizziness. Currently recommending SNF due to assist level, weakness and requiring 24 hr assist due to high fall risk. Pt currently with functional limitations due to the deficits listed below (see PT Problem List). Pt will benefit from skilled PT to increase their independence and safety with mobility to allow discharge to the venue listed below.       Follow Up Recommendations SNF    Equipment Recommendations  None recommended by PT    Recommendations for Other Services       Precautions / Restrictions Precautions Precautions: Fall Precaution Comments: monitor BP, hemoglobin Restrictions Weight Bearing Restrictions: No      Mobility  Bed Mobility Overal bed mobility: Needs Assistance Bed Mobility: Supine to Sit;Sit to Supine  Supine to sit: Min guard;HOB elevated Sit to supine: Min assist   General bed mobility comments: min G to upright trunk with use of bedrail and scoot out to EOB, min A to assist trunk back into center of bed  with return to supine and pt able to lift BLE    Transfers Overall transfer level: Needs assistance Equipment used: Rolling walker (2 wheeled);1 person hand held assist Transfers: Sit to/from [facility] Sit to Stand: Min assist;+2 safety/equipment Stand pivot transfers: Min assist;+2 safety/equipment    General transfer comment: initial STS and stand pivot with min A+2 using RW, cues for hand placement and sequencing, with return to bed min A +2 for safety with therapist positioned anterior to pt to steady due to increased dizziness  Ambulation/Gait  General Gait Details: unable due to dizziness  Stairs            Wheelchair Mobility    Modified Rankin (Stroke Patients Only)       Balance Overall balance assessment: Needs assistance Sitting-balance support: Feet supported;Bilateral upper extremity supported Sitting balance-Leahy Scale: Fair Sitting balance - Comments: seated EOB   Standing balance support: During functional activity;Bilateral upper extremity supported Standing balance-Leahy Scale: Poor Standing balance comment: reliant on UE support, min A        Pertinent Vitals/Pain Pain Assessment: No/denies pain    Home Living Family/patient expects to be discharged to:: Private residence Living Arrangements: Children Available Help at Discharge: Family;Available PRN/intermittently Type of Home: House Home Access: Stairs to enter Entrance Stairs-Rails: None Entrance Stairs-Number of Steps: 3 Home Layout: One level Home Equipment: Walker - 4 wheels;Shower seat;Bedside commode      Prior Function Level of Independence: Independent with assistive device(s);Needs assistance   Gait / Transfers Assistance Needed: Pt reports using rollator walker for home ambulation and limited community ambulation, using w/c  to get into/out of doctor office  ADL's / Homemaking Assistance Needed: Pt reports independent with bathing, dressing, light cooking;  son performs cooking, cleaning, driving  Comments: Pt denies falls, reports recent hospitalization 3 weeks ago but also states year is 2021 so unsure of accuracy.     Hand Dominance        Extremity/Trunk Assessment   Upper Extremity Assessment Upper Extremity Assessment: Defer to OT evaluation    Lower Extremity Assessment Lower Extremity Assessment: Generalized weakness    Cervical / Trunk Assessment Cervical / Trunk Assessment: Normal  Communication   Communication: No difficulties  Cognition Arousal/Alertness: Awake/alert Behavior During Therapy: Flat affect Overall Cognitive Status: No family/caregiver present to determine baseline cognitive functioning  General Comments: Pt answers questions appropriately, requires increased time to complete tasks, states year is 2021, but otherwise appropriate. Pt dizzy throughout eval      General Comments General comments (skin integrity, edema, etc.): Pt slightly dizzy and requesting to use restroom upon arrival, with transfer to [facility] pt with increased dizziness that improved "a little" with time, BP 99/54mmHg while seated on BSC, assisted pt back to supine in bed and BP 120/60mmHg with minimal improvement in dizziness- RN notified    Exercises     Assessment/Plan    PT Assessment Patient needs continued PT services  PT Problem List Decreased strength;Decreased activity tolerance;Decreased balance;Decreased mobility;Decreased cognition;Cardiopulmonary status limiting activity;Obesity       PT Treatment Interventions DME instruction;Gait training;Functional mobility training;Therapeutic activities;Therapeutic exercise;Balance training;Neuromuscular re-education;Cognitive remediation;Patient/family education    PT Goals (Current goals can be found in the Care Plan section)  Acute Rehab PT Goals Patient Stated Goal: "go to the bathroom" PT Goal Formulation: With patient Time For Goal Achievement: 03/31/21 Potential to Achieve  Goals: Good    Frequency Min 2X/week   Barriers to discharge        Co-evaluation PT/OT/SLP Co-Evaluation/Treatment: Yes Reason for Co-Treatment: For patient/therapist safety;To address functional/ADL transfers PT goals addressed during session: Mobility/safety with mobility;Balance;Proper use of DME         AM-PAC PT "6 Clicks" Mobility  Outcome Measure Help needed turning from your back to your side while in a flat bed without using bedrails?: A Little Help needed moving from lying on your back to sitting on the side of a flat bed without using bedrails?: A Little Help needed moving to and from a bed to a chair (including a wheelchair)?: A Little Help needed standing up from a chair using your arms (e.g., wheelchair or bedside chair)?: A Little Help needed to walk in hospital room?: A Lot Help needed climbing 3-5 steps with a railing? : Total 6 Click Score: 15    End of Session Equipment Utilized During Treatment: Gait belt Activity Tolerance: Treatment limited secondary to medical complications (Comment) (dizziness) Patient left: in bed;with call bell/phone within reach;with bed alarm set Nurse Communication: Mobility status;Other (comment) (dizziness, decreased BP with transfer) PT Visit Diagnosis: Unsteadiness on feet (R26.81);Other abnormalities of gait and mobility (R26.89);Muscle weakness (generalized) (M62.81)    Time: 2440-1027 PT Time Calculation (min) (ACUTE ONLY): 26 min   Charges:   PT Evaluation $PT Eval Moderate Complexity: 1 Mod           Tori Sewell Pitner PT, DPT 03/17/21, 9:51 AM

## 2021-03-17 NOTE — Progress Notes (Addendum)
PROGRESS NOTE    Patient: Tracey Bryant                            PCP: Coolidge Breeze, FNP                    DOB: Jul 19, 1951            DOA: 03/16/2021 WLN:[id number]             DOS: 03/17/2021, 12:23 PM   LOS: 1 day   Date of Service: The patient was seen and examined on 03/17/2021  Subjective:   Patient was seen and examined this morning, in ICU setting. Very lethargic, pale....   hemoglobin dropped to 6.5.. .. 7.1 yesterday, no signs of bleeding status post fluid resuscitation for DKA. Remains mildly hypotensive 99/54 this morning Complaining of significant abdominal pain and nausea but no vomiting... Patient was started on diet but she could not tolerated this morning.  Brief Narrative:    Tracey Bryant is a 70 y.o. female with medical history significant of   DM 2, cardiomyopathy, CVA on Eliquis, HTN, presented with generalized weakness, nausea vomiting x2 days.  Patient presented to ED on 03/15/21 with epistaxis to ED, he was treated with Afrin, cottonball bleeding had stopped patient was discharged home.  Hemoglobin 8.5 around baseline yesterday.  Her nosebleeds had stopped  Was recently started on insulin but she has not been taking it, she is only been taking her Janumet. She believes that she takes Eliquis for CVA prevention. Also reports that she is Jehovah's Witness would not like to receive blood transfusion  ED Course:  Vitals: Without acute stable 97.7, 63, 16, 110/63, satting 99% on room air Abnormal labs; sodium 134, potassium 5.4, blood sugar 416, BUN 62, creatinine 2.28, anion gap 17, alk phos 17, albumin 2.9, AST 45, C WBC 16.3, hemoglobin 7.1, hematocrit 22.8 Beta hydroxybutyrate 3.87  Was requested patient to be admitted for diabetic ketoacidosis     Assessment & Plan:   Principal Problem:   DKA (diabetic ketoacidosis) ([place]) Active Problems:   Acute posthemorrhagic anemia   AKI (acute kidney injury) ([place])   Epistaxis   Rheumatoid  arteritis (HCC)   Diabetes mellitus ([place])   Hypertension   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  -On admission sodium 134, potassium 5.4, blood sugar 416, BUN 62, creatinine 2.28, anion gap 17,  Persistent abdominal pain  -Post DKA, unable to tolerate p.o., persistent nausea... No vomiting this morning -We will obtain a CT abdomen pelvis >>>>>  -Resuming gentle IV fluid hydration -As needed analgesics -We will add Reglan IV for now  Addendum: Leukocytosis -SIRS -Ruling out sepsis , afebrile, mildly hypertensive, not tachypneic or tachycardic, -Chest x-ray negative -UA positive for leukocyte esterase, nitrates, WBCs --- infection -WBC 21.9, remained afebrile, mild hypotension -On broad-spectrum antibiotic Zosyn -Continue IV fluid resuscitation -Lactic acid 2.5, procalcitonin 0.31 today      Uncontrolled diabetes mellitus type 2 -Her A1c was around 11 on February 2022 -Rechecking her A1c -She has not started  her home insulin regimen: -Her Lantus titrated 25 units daily With holding carboplatinum restart in a.m. once her p.o. intake improves -Once tolerating p.o., continue with CBG QA CHS, with SSI coverage  Records indicate she is supposed to be on Lantus 50 units, glipizide 5 mg daily -We will check her blood sugar closely, and calculate new basal Anticipating new insulin regimen before discharge   Acute posthemorrhagic anemia -status post epistaxis in the setting of chronic anticoagulation with Eliquis -Monitoring H&H closely, -Her  hemoglobin dropped from 8.5 to 7.1 >>> 6.5 today -Patient stating she is Jehovah's Witness she would not like to receive any blood transfusion -Initiating iron, Granix to boost RBC production   AKI (acute kidney injury) (HCC) -Trend kidney function closely -Hoping to improve as we treat diabetic ketoacidosis -Creatinine 2.4, 2.41, 2.50  Epistaxis -in the setting of chronic anticoagulation with Eliquis -On 03/15/2021 in ED -leading has stopped, status post treatment with Afrin-we will utilize as needed -We will holding Eliquis for today -Monitoring H&H--dropping   Leukocytosis -Possibly reactive, UA chest x-ray clear -no signs of infection -WBC 21.9, afebrile, repeating UA, -Monitor closely, -Anticipating initiating broad-spectrum antibiotics next 24 hours -Lactic acid 2.5, procalcitonin 0.31 today    Active Problems:  Rheumatoid arteritis (HCC) -stable  History of hypertension -today mildly hypotensive -stable , monitor closely, not on any home medication 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HCC) -chronic iron studies within normal limits  Chronic anticoagulation-on Eliquis for CVA prevention  CVA (cerebral vascular accident) (HCC) x3 per patient -We will monitor closely if stop bleeding, Eliquis with be started on later date, Any statins, cont. Zetia  History of congestive heart failure, dysrhythmia on defibrillator Per patient and her son at bedside cardiovascular she has been stable and thus the reason she is on Lasix and Eliquis  Cultures:  -none  Antimicrobial: -noe   Consults called:  None -------------------------------------------------------------------------------------------------------------------------------------------- DVT prophylaxis: SCD/Compression stockings Code Status:   Code Status: Full Code   Admission status:  Patient will be admitted as Inpatient, with a greater than 2 midnight length of stay. Level of care:  ICU   Family Communication: Patient is awake alert, son present at bedside Plan of care was discussed with the patient and her son at the bedside. It was discussed that her current condition is critical... The patient and her son expressed understanding of current finding and plan of care.  Agreed that she does not like to be transfused with PRBC. But confirmed full CODE STATUS   ---------------------------------------------------------------------------------------------------------------------------------------------  Disposition Plan:  Anticipated 1-2 days Status is: Inpatient  Remains inpatient appropriate because:Hemodynamically unstable, Persistent severe electrolyte disturbances and Inpatient level of care appropriate due to severity of illness    Dispo: The patient is from: Home  Anticipated d/c is to: Home  Patient currently is not medically stable to d/c.              Difficult to place patient No   Level of care: ICU Total of 66 minutes of critical care time spent managing patient, discussing plan of care.  Reviewed medications, labs, planning for further care in ICU setting as patient is declining.   Procedures:   No admission procedures for hospital encounter.     Antimicrobials:  Anti-infectives (From admission, onward)   None       Medication:  . Chlorhexidine Gluconate Cloth  6 each Topical Daily  . ezetimibe  10 mg Oral Daily  . ferrous sulfate  325 mg Oral BID WC  .  furosemide  20 mg Oral Daily  . glipiZIDE  5 mg Oral Q breakfast  . insulin aspart  0-9 Units Subcutaneous TID AC & HS  . insulin glargine  25 Units Subcutaneous QPM  . insulin starter kit- pen needles  1 kit Other Once  . metoCLOPramide (REGLAN) injection  10 mg Intravenous Q8H  . sodium chloride flush  3 mL Intravenous Q12H  . Tbo-filgastrim (GRANIX) SQ  300 mcg Subcutaneous ONCE-1800    acetaminophen **OR** acetaminophen, bisacodyl, dextrose,  hydrALAZINE, HYDROmorphone (DILAUDID) injection, ipratropium, levalbuterol, magnesium citrate, ondansetron **OR** ondansetron (ZOFRAN) IV, oxyCODONE, senna-docusate, traZODone   Objective:   Vitals:   03/17/21 0900 03/17/21 1000 03/17/21 1100 03/17/21 1119  BP: 122/79 (!) 117/59 (!) 104/44   Pulse: 74 67 70 70  Resp: 19 20 (!) 21 18  Temp:    98.6 F (37 C)  TempSrc:    Oral  SpO2: 100% 100% 98% 100%  Weight:      Height:        Intake/Output Summary (Last 24 hours) at 03/17/2021 1223 Last data filed at 03/17/2021 1115 Gross per 24 hour  Intake 5143.3 ml  Output --  Net 5143.3 ml   Filed Weights   03/16/21 1248 03/16/21 1646  Weight: 75 kg 81.6 kg     Examination:   Physical Exam  Constitution: Pale, lethargic, able to follow commands, Psychiatric: Withdrawn  HEENT: Normocephalic, PERRL, otherwise with in Normal limits --dried blood around the nares Chest:Chest symmetric Cardio vascular:  S1/S2, RRR, No murmure, No Rubs or Gallops  pulmonary: Clear to auscultation bilaterally, respirations unlabored, negative wheezes / crackles Abdomen: Soft, diffuse tenderness, negative distention, hypoactive bowel sounds,  negative organomegaly or mass Muscular skeletal:  Generalized weaknesses,  Limited exam - in bed, able to move all 4 extremities, Normal strength,  Neuro: CNII-XII intact. , normal motor and sensation, reflexes intact  Extremities: No pitting edema lower extremities, +2 pulses  Skin: Dry, warm to touch, negative for any Rashes, No open wounds Wounds: per nursing documentation-none visible    ------------------------------------------------------------------------------------------------------------------------------------------    LABs:  CBC Latest Ref Rng & Units 03/17/2021 03/16/2021 03/15/2021  WBC 4.0 - 10.5 K/uL 21.9(H) 16.3(H) 13.8(H)  Hemoglobin 12.0 - 15.0 g/dL 6.5(LL) 7.1(L) 8.5(L)  Hematocrit 36.0 - 46.0 % 20.9(L) 22.8(L) 26.8(L)  Platelets 150 - 400  K/uL 293 314 323   CMP Latest Ref Rng & Units 03/17/2021 03/17/2021 03/17/2021  Glucose 70 - 99 mg/dL 171(H) 184(H) 204(H)  BUN 8 - 23 mg/dL 66(H) 64(H) 62(H)  Creatinine 0.44 - 1.00 mg/dL 2.50(H) 2.41(H) 2.40(H)  Sodium 135 - 145 mmol/L 136 137 135  Potassium 3.5 - 5.1 mmol/L 4.3 4.7 4.8  Chloride 98 - 111 mmol/L 109 110 110  CO2 22 - 32 mmol/L 17(L) 17(L) 17(L)  Calcium 8.9 - 10.3 mg/dL 7.7(L) 7.8(L) 7.7(L)  Total Protein 6.5 - 8.1 g/dL - - -  Total Bilirubin 0.3 - 1.2 mg/dL - - -  Alkaline Phos 38 - 126 U/L - - -  AST 15 - 41 U/L - - -  ALT 0 - 44 U/L - - -       Micro Results Recent Results (from the past 240 hour(s))  SARS CORONAVIRUS 2 (TAT 6-24 HRS) Nasopharyngeal Nasopharyngeal Swab     Status: None   Collection Time: 03/16/21  1:32 PM   Specimen: Nasopharyngeal Swab  Result Value Ref Range Status   SARS Coronavirus 2 NEGATIVE NEGATIVE Final    Comment: (NOTE) SARS-CoV-2  target nucleic acids are NOT DETECTED.  The SARS-CoV-2 RNA is generally detectable in upper and lower respiratory specimens during the acute phase of infection. Negative results do not preclude SARS-CoV-2 infection, do not rule out co-infections with other pathogens, and should not be used as the sole basis for treatment or other patient management decisions. Negative results must be combined with clinical observations, patient history, and epidemiological information. The expected result is Negative.  Fact Sheet for Patients: SugarRoll.be  Fact Sheet for Healthcare Providers: https://www.woods-mathews.com/  This test is not yet approved or cleared by the [place] FDA and  has been authorized for detection and/or diagnosis of SARS-CoV-2 by FDA under an Emergency Use Authorization (EUA). This EUA will remain  in effect (meaning this test can be used) for the duration of the COVID-19 declaration under Se ction 564(b)(1) of the Act, 21 U.S.C. section  360bbb-3(b)(1), unless the authorization is terminated or revoked sooner.  Performed at [facility], [place] [address] [place], [place] [address]   MRSA PCR Screening     Status: None   Collection Time: 03/16/21  5:06 PM   Specimen: Nasal Mucosa; Nasopharyngeal  Result Value Ref Range Status   MRSA by PCR NEGATIVE NEGATIVE Final    Comment:        The GeneXpert MRSA Assay (FDA approved for NASAL specimens only), is one component of a comprehensive MRSA colonization surveillance program. It is not intended to diagnose MRSA infection nor to guide or monitor treatment for MRSA infections. Performed at [facility], [address], [place], [place] [address]     Radiology Reports No results found.  SIGNED: Deatra James, MD, FHM. [facility],  Pager (please use [email or web address] to page/text) Please use Epic Secure Chat for non-urgent communication (7AM-7PM)  If 7PM-7AM, please contact night-coverage [email or web address], 03/17/2021, 12:23 PM

## 2021-03-17 NOTE — Evaluation (Signed)
Occupational Therapy Evaluation Patient Details Name: Tracey Bryant MRN: [id number] DOB: 25-Feb-1951 Today's Date: 03/17/2021    History of Present Illness Tracey Bryant is a 69 y.o. female with medical history significant of diabetes, cardiomyopathy, CVA, HTN, with current c/o generalized weakness, nausea vomiting x2 days; pt in ED with epistaxis on 03/15/21, and d/c home after treated and nosebleeds stopped.   Clinical Impression   Pt agreeable to evaluation but limited at times by fatigue likely due to orthostatic hypotension. When placed upright on bedside commode pt BP was 99/63. When in supine at the end of session in bed BP was at 120/67. Pt required minimal assist +2 using gait belt and RW intermittently. Pt slurred speech at times when sitting upright. Pt reported that her last stroke was ~7 months ago. Possible residual deficits with further assessment needed when pt less fatigued. Pt required CGA to min A for bed mobility. Pt would benefit from continued OT in hospital setting to increase strength, endurance, balance, and functional ADLs prior to d/c to the below setting.     Follow Up Recommendations  SNF    Equipment Recommendations  None recommended by OT           Precautions / Restrictions Precautions Precautions: Fall Precaution Comments: monitor BP, hemoglobin Restrictions Weight Bearing Restrictions: No      Mobility Bed Mobility Overal bed mobility: Needs Assistance Bed Mobility: Sit to Supine     Supine to sit: Min guard;HOB elevated Sit to supine: Min assist   General bed mobility comments: Assist to safely lower torso to supine.    Transfers Overall transfer level: Needs assistance Equipment used: Rolling walker (2 wheeled);1 person hand held assist Transfers: Sit to/from [facility] Sit to Stand: Min assist;+2 safety/equipment Stand pivot transfers: Min assist;+2 safety/equipment       General transfer comment: initial STS  and stand pivot with min A+2 using RW, cues for hand placement and sequencing, with return to bed min A +2 for safety with therapist positioned anterior to pt to steady due to increased dizziness    Balance Overall balance assessment: Needs assistance Sitting-balance support: Feet supported;Bilateral upper extremity supported Sitting balance-Leahy Scale: Fair Sitting balance - Comments: seated EOB   Standing balance support: During functional activity;Bilateral upper extremity supported Standing balance-Leahy Scale: Poor Standing balance comment: reliant on UE support, min A                           ADL either performed or assessed with clinical judgement   ADL Overall ADL's : Needs assistance/impaired                     Lower Body Dressing: Total assistance;Bed level Lower Body Dressing Details (indicate cue type and reason): Total assist to don socks in bed. Toilet Transfer: Minimal Designer, jewellery Details (indicate cue type and reason): EOB to bedside cammode and back. RW used for transfer to toilet, no RW used to return for safety due to pt fatigue and lethargy.                 Vision Baseline Vision/History: No visual deficits                  Pertinent Vitals/Pain Pain Assessment: No/denies pain     Hand Dominance Right   Extremity/Trunk Assessment Upper Extremity Assessment Upper Extremity Assessment: Generalized weakness (Continue to more formally assesed. Assessed this date  during functional activity.)   Lower Extremity Assessment Lower Extremity Assessment: Defer to PT evaluation   Cervical / Trunk Assessment Cervical / Trunk Assessment: Normal   Communication Communication Communication: No difficulties (Mild slurred speach.)   Cognition Arousal/Alertness: Awake/alert Behavior During Therapy: Flat affect Overall Cognitive Status: No family/caregiver present to determine baseline cognitive  functioning                                 General Comments: Pt answers questions appropriately, requires increased time to complete tasks, states year is 2021, but otherwise appropriate. Pt dizzy throughout eval   General Comments  Pt slightly dizzy and requesting to use restroom upon arrival, with transfer to [facility] pt with increased dizziness that improved "a little" with time, BP 99/66mmHg while seated on BSC, assisted pt back to supine in bed and BP 120/60mmHg with minimal improvement in dizziness- RN notified               Home Living Family/patient expects to be discharged to:: Private residence Living Arrangements: Children Available Help at Discharge: Family;Available PRN/intermittently Type of Home: House Home Access: Stairs to enter [employer] of Steps: 3 Entrance Stairs-Rails: None Home Layout: One level     Bathroom Shower/Tub: Walk-in shower;Tub/shower unit         Home Equipment: Walker - 4 wheels;Shower seat;Bedside commode          Prior Functioning/Environment Level of Independence: Independent with assistive device(s);Needs assistance  Gait / Transfers Assistance Needed: Pt reports using rollator walker for home ambulation and limited community ambulation, using w/c to get into/out of doctor office ADL's / Homemaking Assistance Needed: Pt reports independent with bathing, dressing, light cooking; son performs cooking, cleaning, driving   Comments: Pt denies falls, reports recent hospitalization 3 weeks ago but also states year is 2021 so unsure of accuracy.        OT Problem List: Decreased strength;Decreased activity tolerance;Impaired balance (sitting and/or standing);Decreased safety awareness      OT Treatment/Interventions: Self-care/ADL training;Therapeutic exercise;Therapeutic activities;Patient/family education;Balance training    OT Goals(Current goals can be found in the care plan section) Acute Rehab OT  Goals Patient Stated Goal: "go to the bathroom" OT Goal Formulation: With patient Time For Goal Achievement: 03/31/21 Potential to Achieve Goals: Good  OT Frequency: Min 2X/week               Co-evaluation PT/OT/SLP Co-Evaluation/Treatment: Yes Reason for Co-Treatment: To address functional/ADL transfers PT goals addressed during session: Mobility/safety with mobility;Balance;Proper use of DME OT goals addressed during session: ADL's and self-care;Strengthening/ROM                       End of Session Equipment Utilized During Treatment: Rolling walker;Gait belt Nurse Communication: Other (comment) (Nursing notified of pt orthostatic hypotension.)  Activity Tolerance: Patient limited by fatigue;Other (comment) (Limited at times by dizziness and drop in blood pressure.) Patient left: in bed;with call bell/phone within reach  OT Visit Diagnosis: Unsteadiness on feet (R26.81);Muscle weakness (generalized) (M62.81);Dizziness and giddiness (R42)                Time: 6503-5465 OT Time Calculation (min): 24 min Charges:  OT General Charges $OT Visit: 1 Visit OT Evaluation $OT Eval Low Complexity: 1 Low  Anistyn Graddy OT, MOT   Larey Seat 03/17/2021, 12:13 PM

## 2021-03-17 NOTE — Progress Notes (Addendum)
Inpatient Diabetes Program Recommendations  AACE/ADA: New Consensus Statement on Inpatient Glycemic Control  Target Ranges:  Prepandial:   less than 140 mg/dL      Peak postprandial:   less than 180 mg/dL (1-2 hours)      Critically ill patients:  140 - 180 mg/dL   Results for Tracey Bryant, Tracey Bryant (MRN [id number]) as of 03/17/2021 07:43  Ref. Range 03/17/2021 00:24 03/17/2021 01:29 03/17/2021 02:34 03/17/2021 03:30 03/17/2021 04:25 03/17/2021 05:59 03/17/2021 06:48  Glucose-Capillary Latest Ref Range: 70 - 99 mg/dL 188 (H) 167 (H) 184 (H) 182 (H) 176 (H) 171 (H) 157 (H)  Results for Tracey Bryant, Tracey Bryant (MRN [id number]) as of 03/17/2021 07:43  Ref. Range 03/17/2021 04:56  Beta-Hydroxybutyric Acid Latest Ref Range: 0.05 - 0.27 mmol/L 0.05   Results for Tracey Bryant, Tracey Bryant (MRN [id number]) as of 03/17/2021 07:43  Ref. Range 03/16/2021 13:00 03/16/2021 15:21  Beta-Hydroxybutyric Acid Latest Ref Range: 0.05 - 0.27 mmol/L 3.87 (H) 3.02 (H)  Glucose Latest Ref Range: 70 - 99 mg/dL 416 (H) 332 (H)  Hemoglobin A1C Latest Ref Range: 4.8 - 5.6 %  9.1 (H)   Review of Glycemic Control  Diabetes history: DM2 Outpatient Diabetes medications:Glipizide XL 5 mg QAM, Metformin 1000 mg BID Current orders for Inpatient glycemic control: Lantus 40 units QHS, Glipizide XL 5 mg QAM, IV insulin  Inpatient Diabetes Program Recommendations:    Insulin: Please discontinue Lantus 40 units QHS and Glipizide XL 5 mg. Once MD is ready to transition from IV to SQ insulin, please consider ordering Lantus 25 units Q24H CBGs Q4H, and Novolog 0-9 units Q4H.  HbgA1C:  A1C 9.1% on 03/16/21 indicating an average glucose of 214 mg/dl over the past 2-3 months. Prior A1C was 11% on 01/27/21.  Addendum 03/17/21_0 :20: Diabetes coordinator working remotely. Called patient over the phone to discuss DM and outpatient DM medication regimen. Patient states that she is taking Glipizide and Metformin at home and reports taking them consistently. Inquired about  insulin as an outpatient and patient reports that she has some kind of insulin at home in her refrigerator but she has not ever taken it because she was not shown how to take insulin. Patient is not sure what the name of the insulin is but reports she got it filled at [facility]. Patient stated that she feels bad and is nauseous. Informed patient I would call [facility] to ask about insulin prescribed and I would also plan to come talk with her tomorrow regarding DM and insulin. Patient verbalized understanding of information discussed. [place] and was told that patient is prescribed Glipizide XR 5 mg daily, Metformin 1000 mg BID, Basaglar 30 units QHS (prescribed in [place] 2021) and Novolog sliding scale (also prescribed in [place] 2021). Ordered insulin starter kit and patient education by bedside RNs for education on insulin administration. RNs, please educate patient on insulin administration and allow patient to self administer insulin injections while inpatient. Diabetes Coordinator will plan to follow up with patient on 03/18/21.   Thanks, Barnie Alderman, RN, MSN, CDE Diabetes Coordinator Inpatient Diabetes Program [phone] (Team Pager from 8am to 5pm)

## 2021-03-17 NOTE — Plan of Care (Signed)
  Problem: Acute Rehab OT Goals (only OT should resolve) Goal: Pt. Will Perform Grooming Flowsheets (Taken 03/17/2021 1214) Pt Will Perform Grooming:  standing  sitting  with set-up Goal: Pt. Will Perform Upper Body Dressing Flowsheets (Taken 03/17/2021 1214) Pt Will Perform Upper Body Dressing:  with supervision  sitting Goal: Pt. Will Perform Lower Body Dressing Flowsheets (Taken 03/17/2021 1214) Pt Will Perform Lower Body Dressing:  with min assist  sitting/lateral leans  sit to/from stand  with adaptive equipment Goal: Pt. Will Transfer To Toilet [place] (Taken 03/17/2021 1214) Pt Will Transfer to Toilet:  with supervision  stand pivot transfer  grab bars Goal: Pt/Caregiver Will Perform Home Exercise Program Flowsheets (Taken 03/17/2021 1214) Pt/caregiver will Perform Home Exercise Program:  Both right and left upper extremity  Increased strength  With Supervision  Neithan Day OT, MOT

## 2021-03-17 NOTE — Progress Notes (Signed)
Lab called critical result of Lactic Acid of 4.4 Went up from 2.5. Dr. Roger Shelter notified.  Patient receiving fluids. Blood cultures pending. Antibiotics started. Waiting for patient to get a CT scan.

## 2021-03-18 ENCOUNTER — Inpatient Hospital Stay (HOSPITAL_COMMUNITY): Payer: Medicare HMO

## 2021-03-18 DIAGNOSIS — E1165 Type 2 diabetes mellitus with hyperglycemia: Secondary | ICD-10-CM

## 2021-03-18 DIAGNOSIS — K8020 Calculus of gallbladder without cholecystitis without obstruction: Secondary | ICD-10-CM

## 2021-03-18 DIAGNOSIS — I5021 Acute systolic (congestive) heart failure: Secondary | ICD-10-CM | POA: Diagnosis not present

## 2021-03-18 DIAGNOSIS — N179 Acute kidney failure, unspecified: Secondary | ICD-10-CM | POA: Diagnosis not present

## 2021-03-18 DIAGNOSIS — D62 Acute posthemorrhagic anemia: Secondary | ICD-10-CM | POA: Diagnosis not present

## 2021-03-18 DIAGNOSIS — E1110 Type 2 diabetes mellitus with ketoacidosis without coma: Secondary | ICD-10-CM | POA: Diagnosis not present

## 2021-03-18 DIAGNOSIS — Z794 Long term (current) use of insulin: Secondary | ICD-10-CM

## 2021-03-18 LAB — CBC
HCT: 22.2 % — ABNORMAL LOW (ref 36.0–46.0)
Hemoglobin: 6.8 g/dL — CL (ref 12.0–15.0)
MCH: 29.8 pg (ref 26.0–34.0)
MCHC: 30.6 g/dL (ref 30.0–36.0)
MCV: 97.4 fL (ref 80.0–100.0)
Platelets: 292 10*3/uL (ref 150–400)
RBC: 2.28 MIL/uL — ABNORMAL LOW (ref 3.87–5.11)
RDW: 17.1 % — ABNORMAL HIGH (ref 11.5–15.5)
WBC: 55.8 10*3/uL (ref 4.0–10.5)
nRBC: 0.1 % (ref 0.0–0.2)

## 2021-03-18 LAB — BASIC METABOLIC PANEL
Anion gap: 14 (ref 5–15)
BUN: 78 mg/dL — ABNORMAL HIGH (ref 8–23)
CO2: 13 mmol/L — ABNORMAL LOW (ref 22–32)
Calcium: 7.4 mg/dL — ABNORMAL LOW (ref 8.9–10.3)
Chloride: 104 mmol/L (ref 98–111)
Creatinine, Ser: 3.4 mg/dL — ABNORMAL HIGH (ref 0.44–1.00)
GFR, Estimated: 14 mL/min — ABNORMAL LOW (ref >60)
Glucose, Bld: 323 mg/dL — ABNORMAL HIGH (ref 70–99)
Potassium: 5.2 mmol/L — ABNORMAL HIGH (ref 3.5–5.1)
Sodium: 131 mmol/L — ABNORMAL LOW (ref 135–145)

## 2021-03-18 LAB — ECHOCARDIOGRAM COMPLETE
AR max vel: 1.82 cm2
AV Area VTI: 1.7 cm2
AV Area mean vel: 1.5 cm2
AV Mean grad: 2.3 mmHg
AV Peak grad: 4.6 mmHg
Ao pk vel: 1.08 m/s
Area-P 1/2: 4.77 cm2
Height: 66 in
S' Lateral: 3.43 cm
Weight: 2892.44 oz

## 2021-03-18 LAB — GLUCOSE, CAPILLARY
Glucose-Capillary: 283 mg/dL — ABNORMAL HIGH (ref 70–99)
Glucose-Capillary: 297 mg/dL — ABNORMAL HIGH (ref 70–99)
Glucose-Capillary: 256 mg/dL — ABNORMAL HIGH (ref 70–99)
Glucose-Capillary: 250 mg/dL — ABNORMAL HIGH (ref 70–99)

## 2021-03-18 LAB — LACTIC ACID, PLASMA: Lactic Acid, Venous: 4.4 mmol/L (ref 0.5–1.9)

## 2021-03-18 LAB — HEPATIC FUNCTION PANEL
ALT: 794 U/L — ABNORMAL HIGH (ref 0–44)
AST: 804 U/L — ABNORMAL HIGH (ref 15–41)
Albumin: 2.6 g/dL — ABNORMAL LOW (ref 3.5–5.0)
Alkaline Phosphatase: 40 U/L (ref 38–126)
Bilirubin, Direct: 0.1 mg/dL (ref 0.0–0.2)
Indirect Bilirubin: 0.5 mg/dL (ref 0.3–0.9)
Total Bilirubin: 0.6 mg/dL (ref 0.3–1.2)
Total Protein: 5.5 g/dL — ABNORMAL LOW (ref 6.5–8.1)

## 2021-03-18 LAB — VITAMIN B12: Vitamin B-12: 1568 pg/mL — ABNORMAL HIGH (ref 180–914)

## 2021-03-18 LAB — PROTIME-INR
INR: 1.6 — ABNORMAL HIGH (ref 0.8–1.2)
Prothrombin Time: 18.3 seconds — ABNORMAL HIGH (ref 11.4–15.2)

## 2021-03-18 LAB — FOLATE: Folate: 6.5 ng/mL (ref >5.9)

## 2021-03-18 LAB — PROCALCITONIN: Procalcitonin: 1.08 ng/mL

## 2021-03-18 LAB — OCCULT BLOOD X 1 CARD TO LAB, STOOL: Fecal Occult Bld: POSITIVE — AB

## 2021-03-18 IMAGING — US US ABDOMEN LIMITED RUQ/ASCITES
1 series · 14 of 25 positions shown · non-contrast
Comparison: CT [DATE]

CLINICAL DATA: Right upper quadrant abdominal pain

EXAM:
ULTRASOUND ABDOMEN LIMITED RIGHT UPPER QUADRANT

[Series 1: us abdomen limited · 14 of 78 slices shown]
[im 1/78]
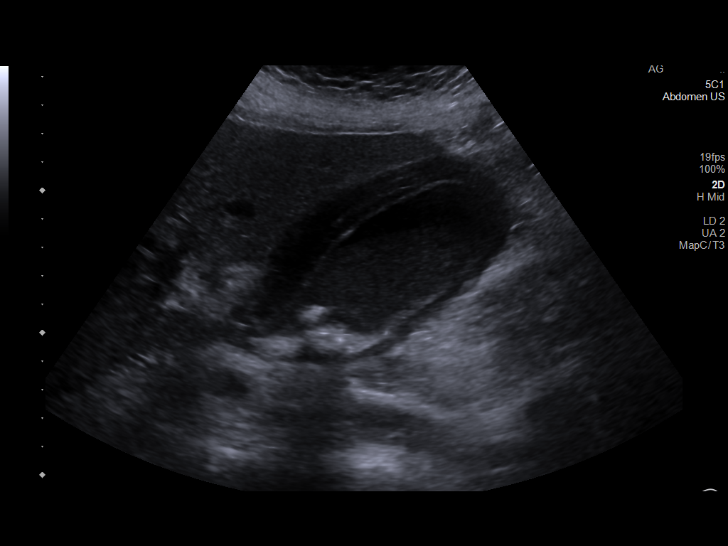
[im 7/78]
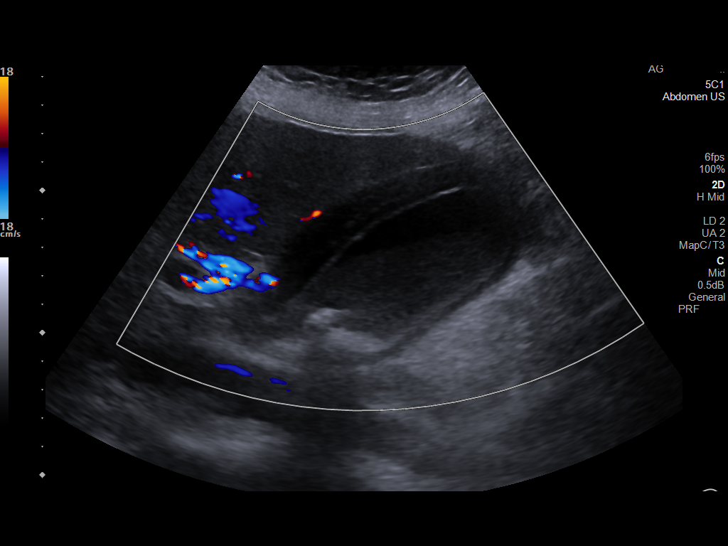
[im 13/78]
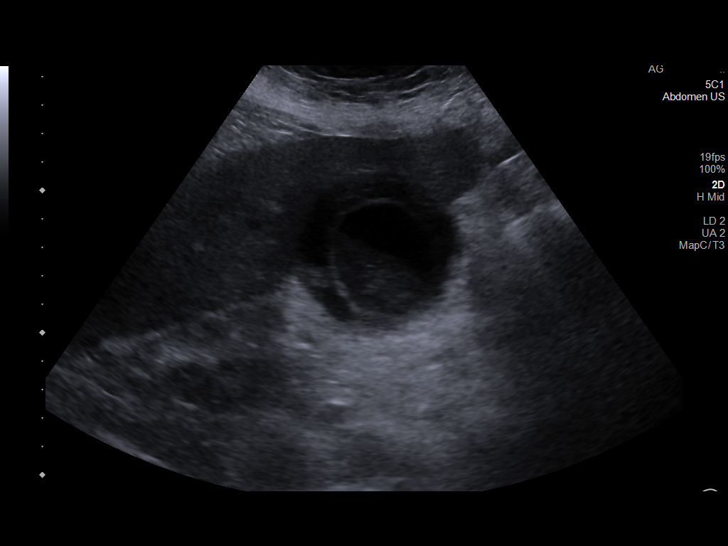
[im 20/78]
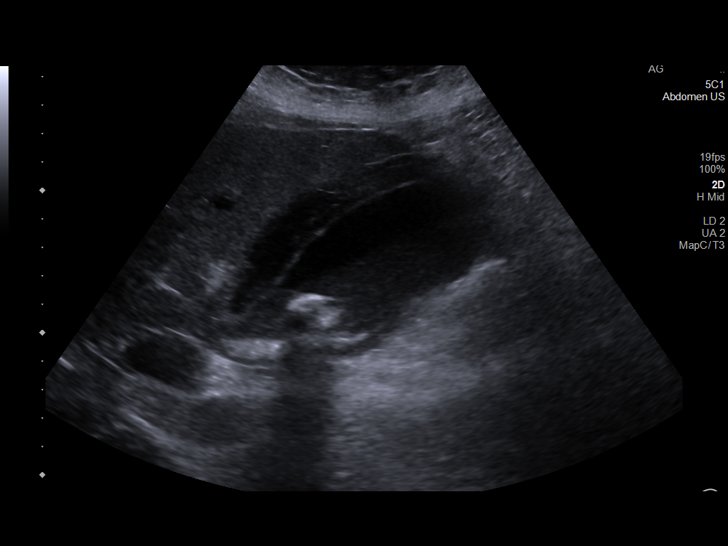
[im 26/78]
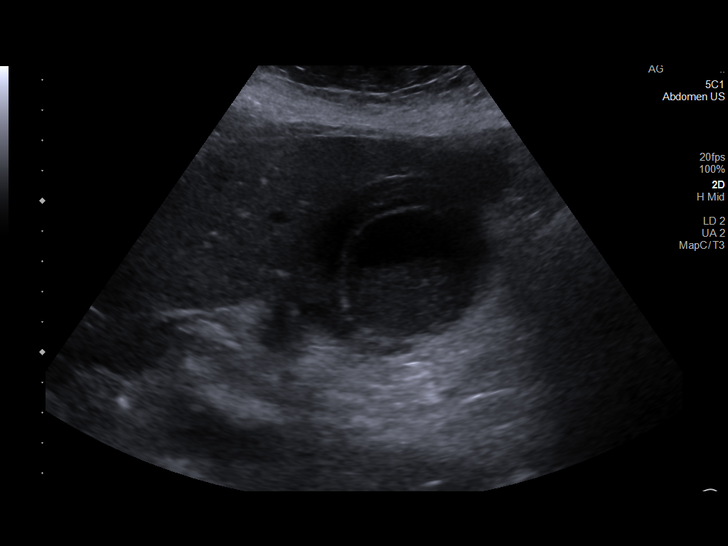
[im 29/78]
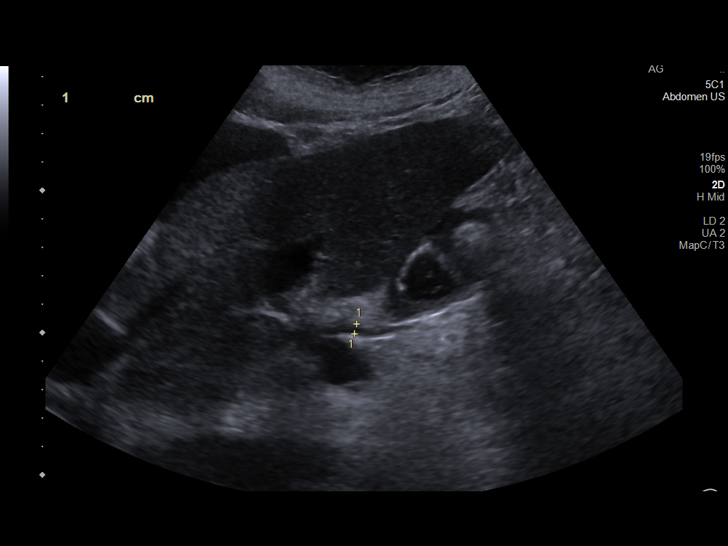
[im 36/78]
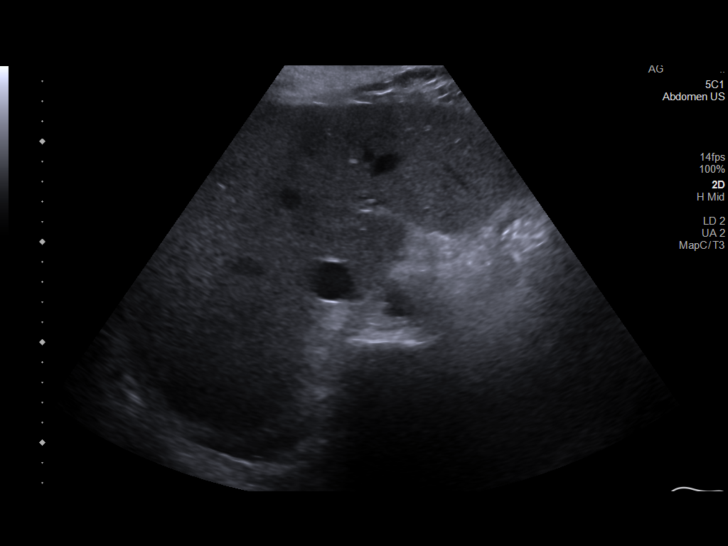
[im 42/78]
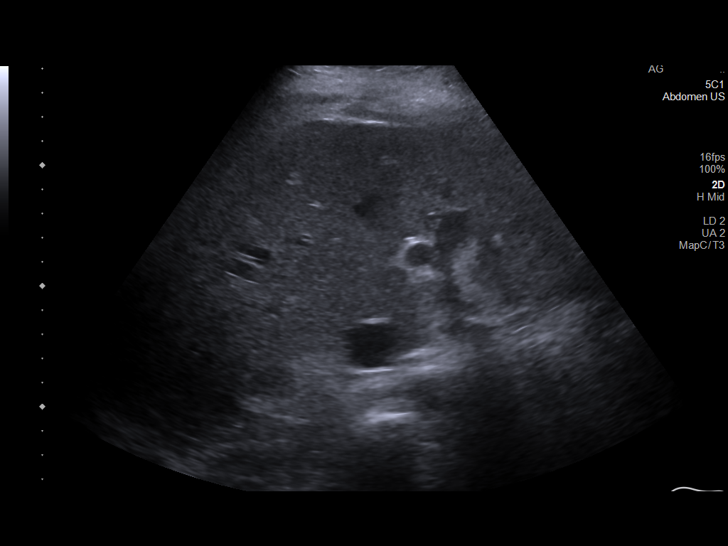
[im 49/78]
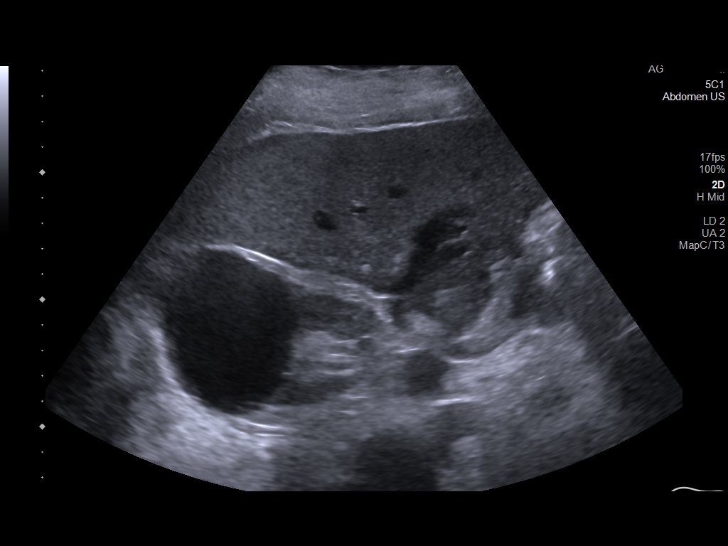
[im 52/78]
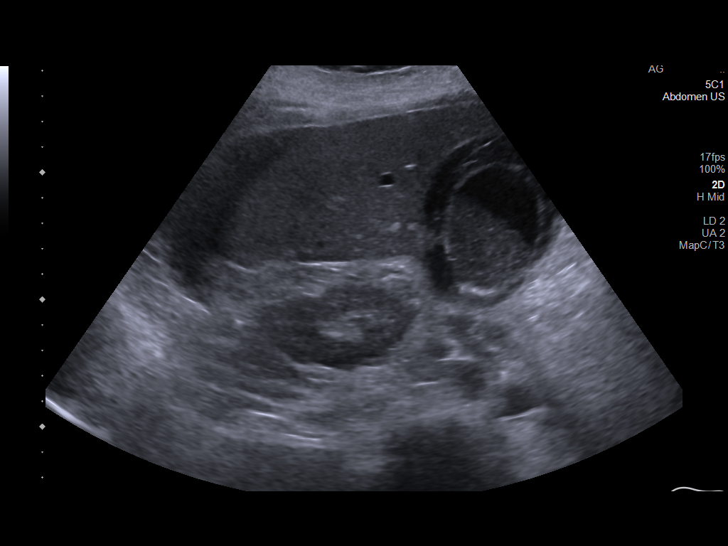
[im 58/78]
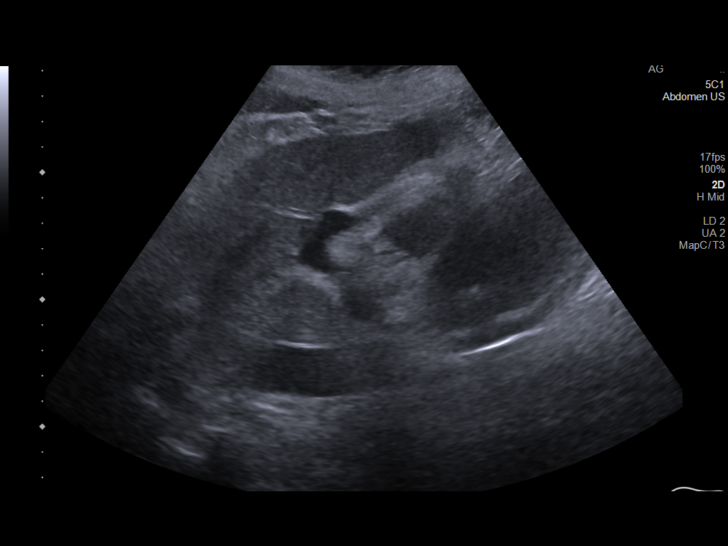
[im 65/78]
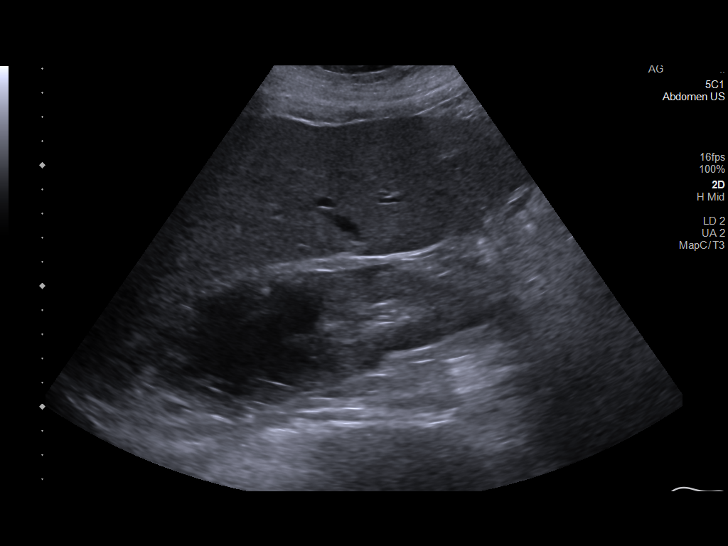
[im 71/78]
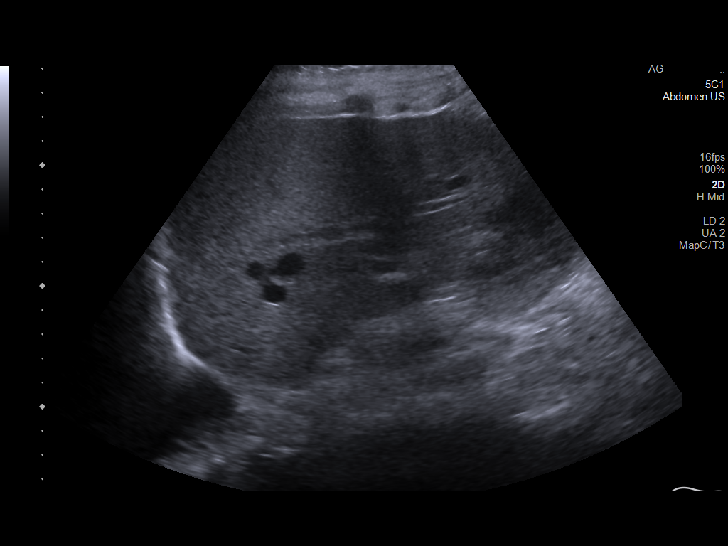
[im 78/78]
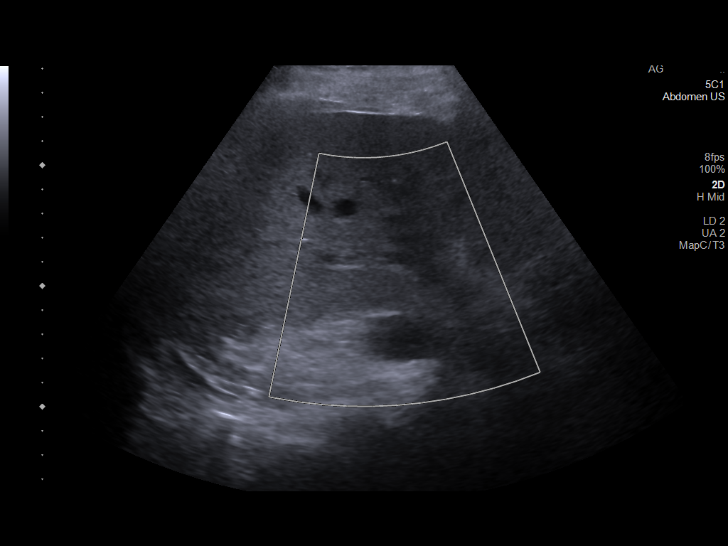

[14 of 25 positions shown; findings below may reference images not displayed]

FINDINGS: Gallbladder:

The gallbladder is distended, there is marked gallbladder wall
thickening, and mild pericholecystic fluid identified. The
gallbladder contains layering sludge and stones. Stones are seen
within the gallbladder neck, however, decubitus positioning was not
performed to assess for impaction. The sonographic Murphy sign is
reportedly positive.

Common bile duct:

Diameter: 4 mm in proximal diameter

Liver:

No focal lesion identified. Within normal limits in parenchymal
echogenicity. Portal vein is patent on color Doppler imaging with
normal direction of blood flow towards the liver.

Other: Mild perihepatic ascites is present. Small right pleural
effusion is noted. 8.9 cm simple cortical cyst is noted within the
upper pole of the right kidney.
IMPRESSION: Acute calculus cholecystitis.

Mild perihepatic ascites and small right pleural effusion.

## 2021-03-18 IMAGING — US US ABDOMEN COMPLETE
2 series · 13 of 25 positions shown · non-contrast
Comparison: CT [DATE]

CLINICAL DATA: Cholelithiasis, acute renal failure

EXAM:
ABDOMEN ULTRASOUND COMPLETE

[Series 1: us abdomen complete · 1 of 1 slices shown (1 of 2)]
[im 1/1]
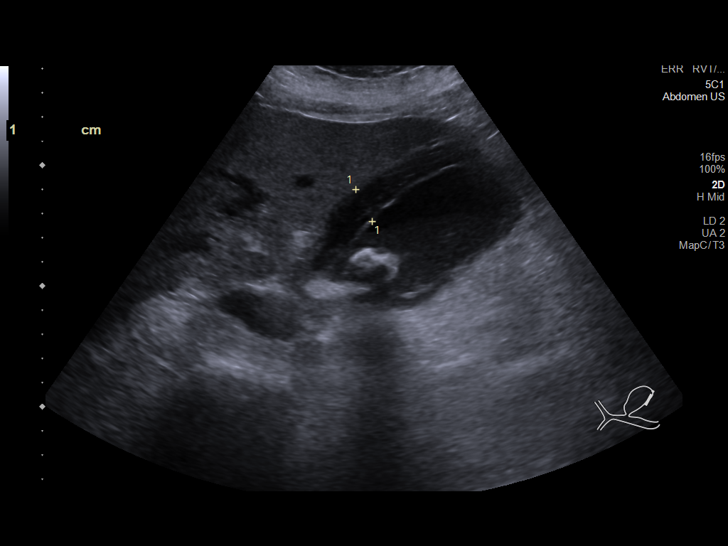

[Series 1: us abdomen complete · 12 of 69 slices shown (2 of 2)]
[im 3/69]
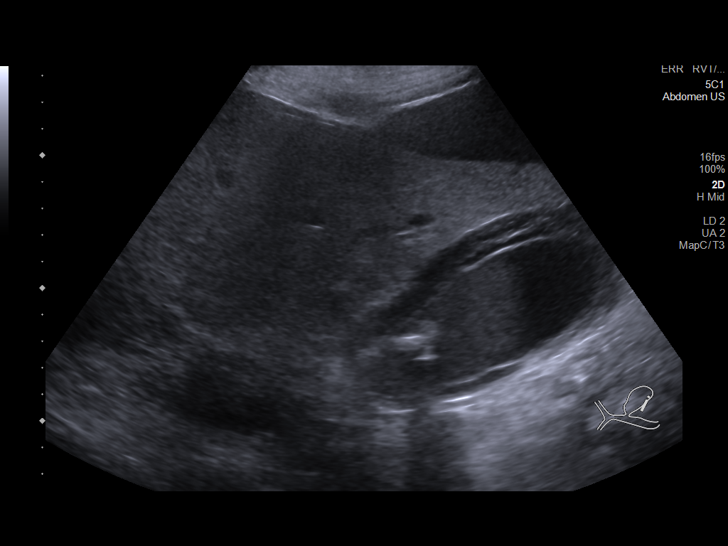
[im 9/69]
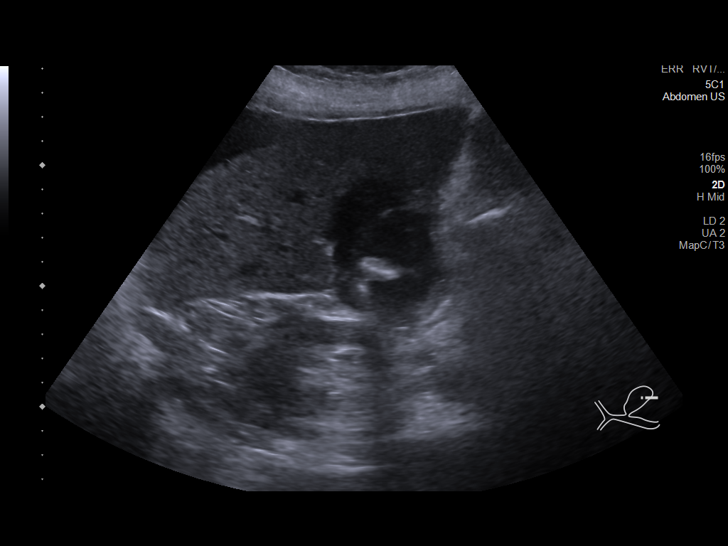
[im 15/69]
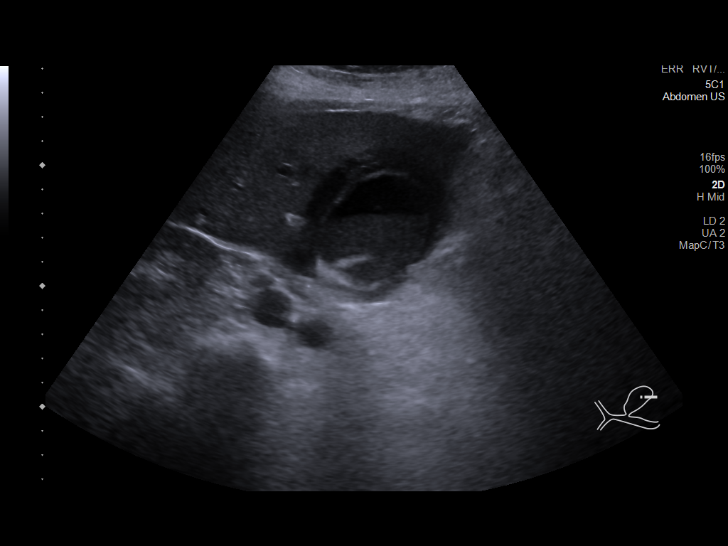
[im 21/69]
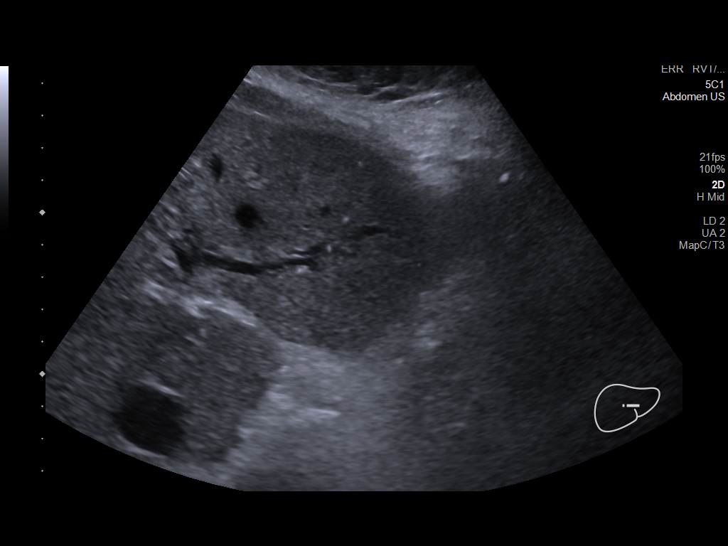
[im 27/69]
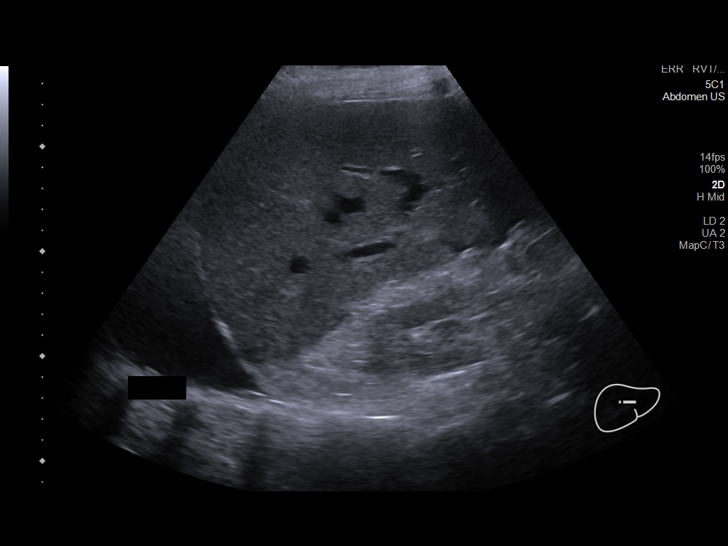
[im 33/69]
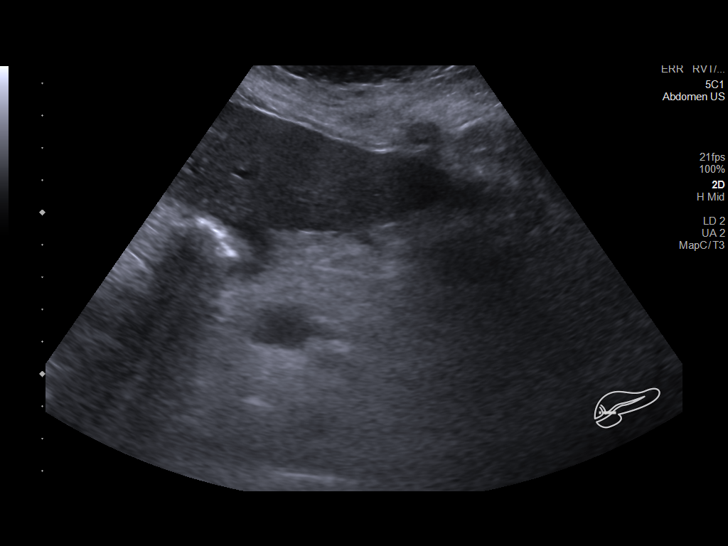
[im 39/69]
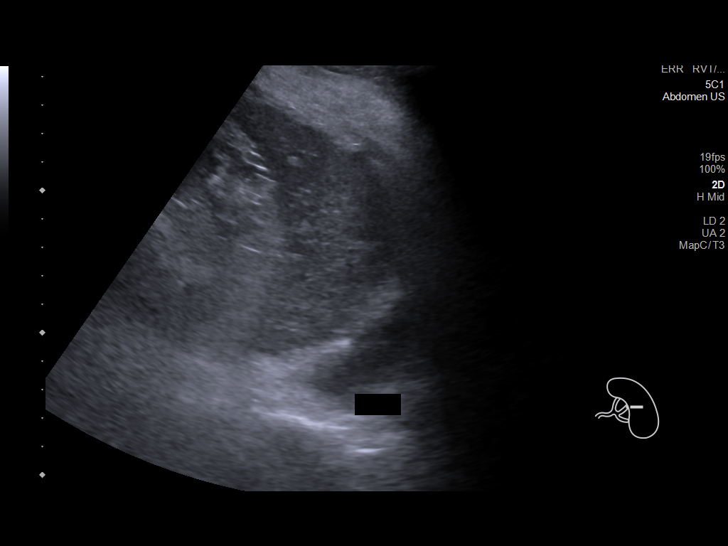
[im 45/69]
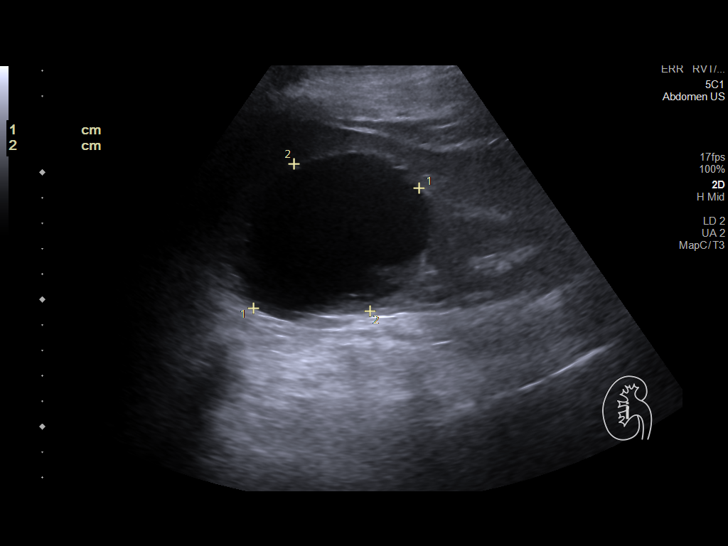
[im 51/69]
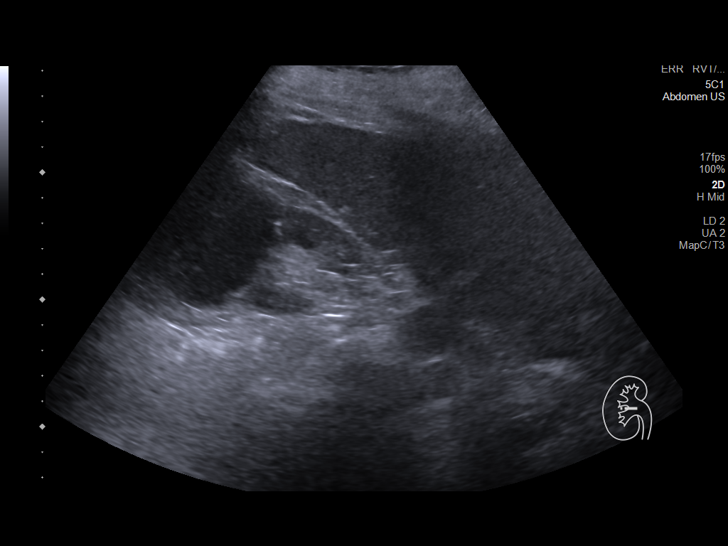
[im 57/69]
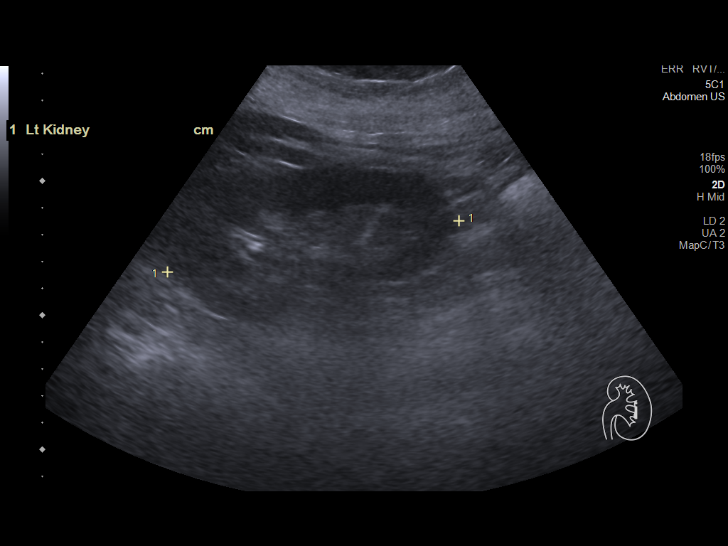
[im 63/69]
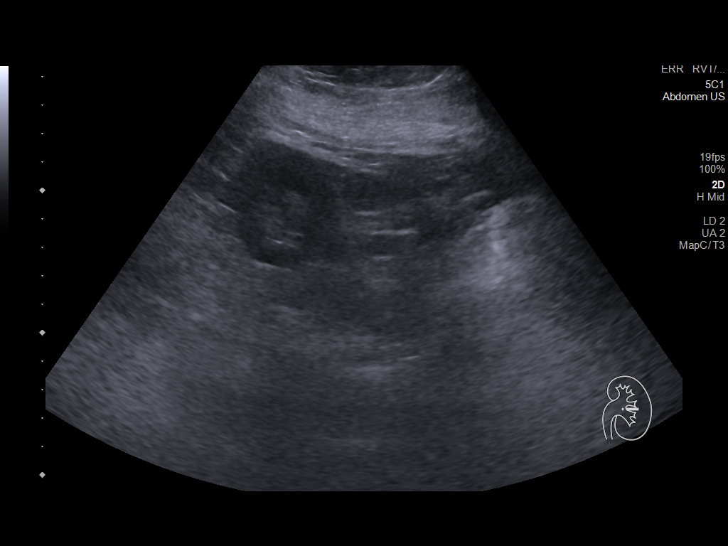
[im 69/69]
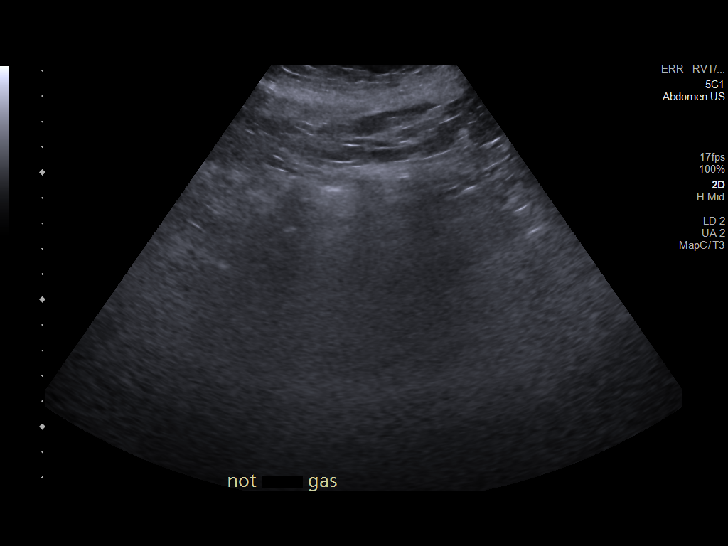

[13 of 25 positions shown; findings below may reference images not displayed]

FINDINGS: Gallbladder: The gallbladder is distended. Several shadowing
gallstones are seen impacted within the gallbladder neck and there
is layering sludge within the gallbladder. The gallbladder wall is
markedly thickened measuring up to 14 mm in diameter and mild
pericholecystic fluid is identified. The sonographic Murphy sign is
reportedly positive.

Common bile duct: Diameter: 4-5 mm in proximal diameter.

Liver: No focal lesion identified. Within normal limits in
parenchymal echogenicity. Portal vein is patent on color Doppler
imaging with normal direction of blood flow towards the liver.

IVC: No abnormality visualized.

Pancreas: Visualized portion unremarkable.

Spleen: Size and appearance within normal limits.

Right Kidney: Length: 10.9 cm. Echogenicity within normal limits. No
mass or hydronephrosis visualized. 8.1 cm simple cortical cyst
identified within the upper pole.

Left Kidney: Length: 11.0 cm. Echogenicity within normal limits. No
mass or hydronephrosis visualized.

Abdominal aorta: No aneurysm visualized, though the distal aorta is
obscured by overlying bowel gas.

Other findings: Small right pleural effusion is present. Mild
perihepatic and perisplenic ascites is noted.
IMPRESSION: Acute calculus cholecystitis.

Mild ascites.  Small right pleural effusion.

8.1 cm right upper pole simple cortical cyst. This is best
characterized as a Bosniak class 1 cyst and further follow-up is not
required.

## 2021-03-18 MED ORDER — INSULIN ASPART 100 UNIT/ML ~~LOC~~ SOLN
0.0000 [IU] | Freq: Every day | SUBCUTANEOUS | Status: DC
  Administered 2021-03-18 – 2021-03-23 (×4): 2 [IU] via SUBCUTANEOUS
  Administered 2021-03-24 – 2021-04-04 (×2): 3 [IU] via SUBCUTANEOUS
  Administered 2021-04-05: 2 [IU] via SUBCUTANEOUS

## 2021-03-18 MED ORDER — SODIUM CHLORIDE 0.9 % IV BOLUS
1000.0000 mL | Freq: Once | INTRAVENOUS | Status: AC
  Administered 2021-03-18: 1000 mL via INTRAVENOUS

## 2021-03-18 MED ORDER — PIPERACILLIN-TAZOBACTAM 3.375 G IVPB
3.3750 g | Freq: Two times a day (BID) | INTRAVENOUS | Status: DC
  Administered 2021-03-18 – 2021-03-26 (×16): 3.375 g via INTRAVENOUS
  Filled 2021-03-18 (×17): qty 50

## 2021-03-18 MED ORDER — INSULIN GLARGINE 100 UNIT/ML ~~LOC~~ SOLN
12.0000 [IU] | Freq: Every day | SUBCUTANEOUS | Status: DC
  Administered 2021-03-18 – 2021-03-20 (×3): 12 [IU] via SUBCUTANEOUS
  Filled 2021-03-18 (×5): qty 0.12

## 2021-03-18 MED ORDER — SODIUM CHLORIDE 0.9 % IV SOLN: INTRAVENOUS | Status: DC

## 2021-03-18 MED ORDER — SODIUM ZIRCONIUM CYCLOSILICATE 10 G PO PACK
10.0000 g | PACK | Freq: Once | ORAL | Status: AC
  Administered 2021-03-18: 10 g via ORAL
  Filled 2021-03-18: qty 1

## 2021-03-18 MED ORDER — INSULIN ASPART 100 UNIT/ML ~~LOC~~ SOLN
0.0000 [IU] | Freq: Three times a day (TID) | SUBCUTANEOUS | Status: DC
  Administered 2021-03-18 (×2): 8 [IU] via SUBCUTANEOUS
  Administered 2021-03-19: 2 [IU] via SUBCUTANEOUS
  Administered 2021-03-19: 1 [IU] via SUBCUTANEOUS
  Administered 2021-03-19 – 2021-03-20 (×3): 3 [IU] via SUBCUTANEOUS
  Administered 2021-03-20: 2 [IU] via SUBCUTANEOUS
  Administered 2021-03-21 – 2021-03-22 (×4): 5 [IU] via SUBCUTANEOUS
  Administered 2021-03-23: 3 [IU] via SUBCUTANEOUS
  Administered 2021-03-24 (×2): 5 [IU] via SUBCUTANEOUS
  Administered 2021-03-25 – 2021-03-26 (×4): 2 [IU] via SUBCUTANEOUS
  Administered 2021-03-26: 3 [IU] via SUBCUTANEOUS
  Administered 2021-03-27: 2 [IU] via SUBCUTANEOUS
  Administered 2021-03-27 – 2021-03-30 (×7): 3 [IU] via SUBCUTANEOUS
  Administered 2021-03-31 – 2021-04-02 (×4): 2 [IU] via SUBCUTANEOUS
  Administered 2021-04-02: 3 [IU] via SUBCUTANEOUS
  Administered 2021-04-03 – 2021-04-04 (×3): 2 [IU] via SUBCUTANEOUS
  Administered 2021-04-04: 3 [IU] via SUBCUTANEOUS
  Administered 2021-04-05: 2 [IU] via SUBCUTANEOUS
  Administered 2021-04-07: 5 [IU] via SUBCUTANEOUS
  Administered 2021-04-07: 3 [IU] via SUBCUTANEOUS
  Administered 2021-04-08: 5 [IU] via SUBCUTANEOUS

## 2021-03-18 NOTE — Progress Notes (Addendum)
Inpatient Diabetes Program Recommendations  AACE/ADA: New Consensus Statement on Inpatient Glycemic Control  Target Ranges:  Prepandial:   less than 140 mg/dL      Peak postprandial:   less than 180 mg/dL (1-2 hours)      Critically ill patients:  140 - 180 mg/dL  Results for Tracey Bryant, Tracey Bryant (MRN [id number]) as of 03/18/2021 06:56  Ref. Range 03/18/2021 06:30  Glucose Latest Ref Range: 70 - 99 mg/dL 323 (H)   Results for Tracey Bryant, Tracey Bryant (MRN [id number]) as of 03/18/2021 06:56  Ref. Range 03/17/2021 06:48 03/17/2021 10:10 03/17/2021 11:18 03/17/2021 17:04 03/17/2021 20:47  Glucose-Capillary Latest Ref Range: 70 - 99 mg/dL 157 (H) 164 (H)  IV insulin stopped @ 10:02  Glipizide 5 mg@10 :06 215 (H)  Novolog 3 units 336 (H)  Novolog 7 units   Lantus 25 units@17 :06 365 (H)  Novolog 9 units   Results for Tracey Bryant, Tracey Bryant (MRN [id number]) as of 03/18/2021 06:56  Ref. Range 03/16/2021 15:21  Hemoglobin A1C Latest Ref Range: 4.8 - 5.6 % 9.1 (H)   Review of Glycemic Control  Diabetes history: DM2 Outpatient Diabetes medications: Glipizide XL 5 mg QAM, Metformin 1000 mg BID Current orders for Inpatient glycemic control: Lantus 25 units QPM, Novolog 0-9 units AC&HS, Glipizide XL 5 mg QAM   Inpatient Diabetes Program Recommendations:    Insulin: IV insulin was stopped on 03/17/21 around 10:02 am. No basal insulin was given until Lantus 25 units given at 17:06 on 03/17/21.  Please consider adding Lantus 12 units QAM (continue Lantus 25 QPM) and Novolog correction to 0-15 units and frequency to Q4H (if patient still not tolerating PO intake).  Addendum 03/18/21@9 :55-Talked with patient at bedside regarding DM and A1C. Patient states that she does not feel good and is very weak. Patient kept eyes mostly closed during our conversation. Patient states that she is suppose to be checking glucose at home but she does not check it very often. Patient is not able to verbalize goal glucose values. Discuss glucose  and A1C goals. Explained that current A1C is 9.1% indicating an average glucose of 214 mg/dl. Patient confirms that she has insulin at home in her refrigerator. Informed patient that when [place] was called yesterday, they stated that patient was prescribed Basaglar and Novolog in [place]. Patient states that sounds about right. Patient has not taken any insulin at all as she notes that she was never shown how to take insulin. Discussed insulin administration and asked patient to practice using an insulin pen as she was verbally instructed how to use it. Patient states she does not feel like looking at the insulin pen and she is feeling nauseous and weak. Patient states that she plans to move in with her son when she gets discharged from the hospital and that he will have to learn about insulin so he can help her at home with insulin administration. Patient reported that she needed to use the bedpan. Assisted patient use the bedpan and she had a large amount of black stool. Encouraged patient to work with nursing staff on insulin administration when she was feeling better as she would likely need to start taking insulin at home. Patient verbalized understanding of information discussed and states that she has no questions at this time. Spoke with Estill Bamberg, RN regarding patient and made her aware that patient had large amount of black stool while on the bedpan. Nursing staff will need to work with patient and her son on insulin administration to  ensure they will be able to administer insulin outpatient.   Thanks, Barnie Alderman, RN, MSN, CDE Diabetes Coordinator Inpatient Diabetes Program [phone] (Team Pager from 8am to 5pm)

## 2021-03-18 NOTE — Progress Notes (Signed)
Date and time results received: 03/18/21 0703 (use smartphrase ".now" to insert current time)  Test: WBC Critical Value: 55.7  Test: Hgb Critical Value: 6.8  Name of Provider Notified: Dr. Carles Collet  Orders Received? Or Actions Taken?: No new orders received.

## 2021-03-18 NOTE — Progress Notes (Signed)
Labs reviewed; [place] abd reviewed Case discussed and reviewed with general surgery, Dr. Mirian Mo recommended cholecystotomy tube by IR -discussed with IR PA-C, Kasey -IR concerned with patient's clinical condition and recommended transfer to [facility] to stay after the procedure is done Dr. Arnoldo Morale, patient and patient's son updated about the transfer.  Orson Eva, DO

## 2021-03-18 NOTE — Consult Note (Signed)
Reason for Consult: Right upper quadrant abdominal pain Referring Physician: Hospitalist service  Tracey Bryant is an 70 y.o. female.  HPI: Patient is a 70 year old white female with multiple medical problems who presented to the hospital with a 2-day history of worsening right upper quadrant abdominal pain, nausea, and vomiting.  She has a history of epistaxis which was treated.  When she presented to the emergency room she was noted to have a white blood cell count 21,000.  CT scan of the abdomen showed a small amount of perihepatic ascites but cholelithiasis with a normal common bile duct.  In talking with the patient, she states she has had intermittent episodes of right upper quadrant abdominal pain and nausea.  This was the first significant episode.  She also knew about the gallstones for many years.  She presented with anemia secondary to acute blood loss, acute kidney injury, and uncontrolled diabetes mellitus with hyperglycemia.  She also has an underlying cardiomyopathy.  She currently continues to complain of intermittent epigastric and right upper quadrant abdominal pain.  Patient is a Restaurant manager, fast food.  Past Medical History:  Diagnosis Date  . Arthritis   . Cardiomyopathy ([place])   . Diabetes mellitus without complication ([place])   . Hypertension   . Stroke [facility])     Past Surgical History:  Procedure Laterality Date  . breast tumor    . PACEMAKER IMPLANT    . TONSILLECTOMY      Family History  Problem Relation Age of Onset  . Cancer Mother   . Diabetes Mother   . Heart disease Father     Social History:  reports that she has never smoked. She has never used smokeless tobacco. She reports that she does not drink alcohol and does not use drugs.  Allergies: No Known Allergies  Medications: I have reviewed the patient's current medications.  Results for orders placed or performed during the hospital encounter of 03/16/21 (from the past 48 hour(s))  Type and screen [facility]     Status: None   Collection Time: 03/16/21  1:27 PM  Result Value Ref Range   ABO/RH(D) A POS    Antibody Screen NEG    Sample Expiration      03/19/2021,2359 Performed at [facility], [address], [place], [place] [address]   Iron and TIBC     Status: None   Collection Time: 03/16/21  1:27 PM  Result Value Ref Range   Iron 54 28 - 170 ug/dL   TIBC 374 250 - 450 ug/dL   Saturation Ratios 14 10.4 - 31.8 %   UIBC 320 ug/dL    Comment: Performed at [facility], [address], [place], [place] [address]  Ferritin (Iron Binding Protein)     Status: None   Collection Time: 03/16/21  1:27 PM  Result Value Ref Range   Ferritin 99 11 - 307 ng/mL    Comment: Performed at [facility], [address] [place], [place] [address]  Blood gas, venous (at [facility], not at [facility])     Status: Abnormal   Collection Time: 03/16/21  1:29 PM  Result Value Ref Range   FIO2 21.00    pH, Ven 7.325 7.250 - 7.430   pCO2, Ven 29.5 (L) 44.0 - 60.0 mmHg   pO2, Ven 33.0 32.0 - 45.0 mmHg   Bicarbonate 16.0 (L) 20.0 - 28.0 mmol/L   Acid-base deficit 9.9 (H) 0.0 - 2.0 mmol/L   O2 Saturation 50.2 %  Patient temperature 37.0     Comment: Performed at [facility], [address] [place], [place] [address]  SARS CORONAVIRUS 2 (TAT 6-24 HRS) Nasopharyngeal Nasopharyngeal Swab     Status: None   Collection Time: 03/16/21  1:32 PM   Specimen: Nasopharyngeal Swab  Result Value Ref Range   SARS Coronavirus 2 NEGATIVE NEGATIVE    Comment: (NOTE) SARS-CoV-2 target nucleic acids are NOT DETECTED.  The SARS-CoV-2 RNA is generally detectable in upper and lower respiratory specimens during the acute phase of infection. Negative results do not preclude SARS-CoV-2 infection, do not rule out co-infections with other pathogens, and should not be used as the sole basis for treatment or other patient management decisions. Negative results must be combined with clinical observations, patient history,  and epidemiological information. The expected result is Negative.  Fact Sheet for Patients: SugarRoll.be  Fact Sheet for Healthcare Providers: https://www.woods-mathews.com/  This test is not yet approved or cleared by the [place] FDA and  has been authorized for detection and/or diagnosis of SARS-CoV-2 by FDA under an Emergency Use Authorization (EUA). This EUA will remain  in effect (meaning this test can be used) for the duration of the COVID-19 declaration under Se ction 564(b)(1) of the Act, 21 U.S.C. section 360bbb-3(b)(1), unless the authorization is terminated or revoked sooner.  Performed at [facility], [place] [address], [place], [place] [address]   POC occult blood, ED     Status: None   Collection Time: 03/16/21  1:41 PM  Result Value Ref Range   Fecal Occult Bld Negative   POC occult blood, ED     Status: None   Collection Time: 03/16/21  1:44 PM  Result Value Ref Range   Fecal Occult Bld NEGATIVE NEGATIVE  CBG monitoring, ED     Status: Abnormal   Collection Time: 03/16/21  2:16 PM  Result Value Ref Range   Glucose-Capillary 385 (H) 70 - 99 mg/dL    Comment: Glucose reference range applies only to samples taken after fasting for at least 8 hours.  Brain natriuretic peptide     Status: Abnormal   Collection Time: 03/16/21  3:21 PM  Result Value Ref Range   B Natriuretic Peptide 1,504.0 (H) 0.0 - 100.0 pg/mL    Comment: Performed at [facility], [address], [place], [place] [address]  Basic metabolic panel     Status: Abnormal   Collection Time: 03/16/21  3:21 PM  Result Value Ref Range   Sodium 141 135 - 145 mmol/L    Comment: DELTA CHECK NOTED   Potassium 4.3 3.5 - 5.1 mmol/L    Comment: DELTA CHECK NOTED   Chloride 117 (H) 98 - 111 mmol/L   CO2 11 (L) 22 - 32 mmol/L   Glucose, Bld 332 (H) 70 - 99 mg/dL    Comment: Glucose reference range applies only to samples taken after fasting for at least 8  hours.   BUN 54 (H) 8 - 23 mg/dL   Creatinine, Ser 1.95 (H) 0.44 - 1.00 mg/dL   Calcium 6.8 (L) 8.9 - 10.3 mg/dL   GFR, Estimated 27 (L) >60 mL/min    Comment: (NOTE) Calculated using the CKD-EPI Creatinine Equation (2021)    Anion gap 13 5 - 15    Comment: Performed at [facility], [address] [place], [place] [address]  Beta-hydroxybutyric acid     Status: Abnormal   Collection Time: 03/16/21  3:21 PM  Result Value Ref Range  Beta-Hydroxybutyric Acid 3.02 (H) 0.05 - 0.27 mmol/L    Comment: Performed at [facility], [address], [place], [place] [address]  Hemoglobin A1c     Status: Abnormal   Collection Time: 03/16/21  3:21 PM  Result Value Ref Range   Hgb A1c MFr Bld 9.1 (H) 4.8 - 5.6 %    Comment: (NOTE) Pre diabetes:          5.7%-6.4%  Diabetes:              >6.4%  Glycemic control for   <7.0% adults with diabetes    Mean Plasma Glucose 214.47 mg/dL    Comment: Performed at [facility] [address], [place], [place] [address]  CBG monitoring, ED     Status: Abnormal   Collection Time: 03/16/21  3:22 PM  Result Value Ref Range   Glucose-Capillary 362 (H) 70 - 99 mg/dL    Comment: Glucose reference range applies only to samples taken after fasting for at least 8 hours.  CBG monitoring, ED     Status: Abnormal   Collection Time: 03/16/21  4:22 PM  Result Value Ref Range   Glucose-Capillary 309 (H) 70 - 99 mg/dL    Comment: Glucose reference range applies only to samples taken after fasting for at least 8 hours.  MRSA PCR Screening     Status: None   Collection Time: 03/16/21  5:06 PM   Specimen: Nasal Mucosa; Nasopharyngeal  Result Value Ref Range   MRSA by PCR NEGATIVE NEGATIVE    Comment:        The GeneXpert MRSA Assay (FDA approved for NASAL specimens only), is one component of a comprehensive MRSA colonization surveillance program. It is not intended to diagnose MRSA infection nor to guide or monitor treatment for MRSA  infections. Performed at [facility], [address], [place], [place] [address]   Glucose, capillary     Status: Abnormal   Collection Time: 03/16/21  5:49 PM  Result Value Ref Range   Glucose-Capillary 232 (H) 70 - 99 mg/dL    Comment: Glucose reference range applies only to samples taken after fasting for at least 8 hours.  Glucose, capillary     Status: Abnormal   Collection Time: 03/16/21  6:59 PM  Result Value Ref Range   Glucose-Capillary 205 (H) 70 - 99 mg/dL    Comment: Glucose reference range applies only to samples taken after fasting for at least 8 hours.  Basic metabolic panel     Status: Abnormal   Collection Time: 03/16/21  8:03 PM  Result Value Ref Range   Sodium 136 135 - 145 mmol/L   Potassium 4.7 3.5 - 5.1 mmol/L   Chloride 111 98 - 111 mmol/L   CO2 14 (L) 22 - 32 mmol/L   Glucose, Bld 219 (H) 70 - 99 mg/dL    Comment: Glucose reference range applies only to samples taken after fasting for at least 8 hours.   BUN 62 (H) 8 - 23 mg/dL   Creatinine, Ser 2.33 (H) 0.44 - 1.00 mg/dL   Calcium 7.7 (L) 8.9 - 10.3 mg/dL   GFR, Estimated 22 (L) >60 mL/min    Comment: (NOTE) Calculated using the CKD-EPI Creatinine Equation (2021)    Anion gap 11 5 - 15    Comment: Performed at [facility], [address] [place], [place] [address]  Glucose, capillary     Status: Abnormal   Collection Time: 03/16/21  8:24 PM  Result  Value Ref Range   Glucose-Capillary 201 (H) 70 - 99 mg/dL    Comment: Glucose reference range applies only to samples taken after fasting for at least 8 hours.   Comment 1 Notify RN    Comment 2 Document in Chart   Glucose, capillary     Status: Abnormal   Collection Time: 03/16/21  9:35 PM  Result Value Ref Range   Glucose-Capillary 198 (H) 70 - 99 mg/dL    Comment: Glucose reference range applies only to samples taken after fasting for at least 8 hours.   Comment 1 Notify RN    Comment 2 Document in Chart   Glucose, capillary     Status: Abnormal    Collection Time: 03/16/21 10:34 PM  Result Value Ref Range   Glucose-Capillary 209 (H) 70 - 99 mg/dL    Comment: Glucose reference range applies only to samples taken after fasting for at least 8 hours.  Beta-hydroxybutyric acid     Status: None   Collection Time: 03/16/21 11:08 PM  Result Value Ref Range   Beta-Hydroxybutyric Acid 0.07 0.05 - 0.27 mmol/L    Comment: Performed at [facility], [address] [place], [place] [address]  Glucose, capillary     Status: Abnormal   Collection Time: 03/16/21 11:29 PM  Result Value Ref Range   Glucose-Capillary 196 (H) 70 - 99 mg/dL    Comment: Glucose reference range applies only to samples taken after fasting for at least 8 hours.   Comment 1 Notify RN    Comment 2 Document in Chart   Glucose, capillary     Status: Abnormal   Collection Time: 03/17/21 12:24 AM  Result Value Ref Range   Glucose-Capillary 188 (H) 70 - 99 mg/dL    Comment: Glucose reference range applies only to samples taken after fasting for at least 8 hours.  Glucose, capillary     Status: Abnormal   Collection Time: 03/17/21  1:29 AM  Result Value Ref Range   Glucose-Capillary 167 (H) 70 - 99 mg/dL    Comment: Glucose reference range applies only to samples taken after fasting for at least 8 hours.   Comment 1 Notify RN    Comment 2 Document in Chart   Basic metabolic panel     Status: Abnormal   Collection Time: 03/17/21  2:21 AM  Result Value Ref Range   Sodium 135 135 - 145 mmol/L   Potassium 4.8 3.5 - 5.1 mmol/L   Chloride 110 98 - 111 mmol/L   CO2 17 (L) 22 - 32 mmol/L   Glucose, Bld 204 (H) 70 - 99 mg/dL    Comment: Glucose reference range applies only to samples taken after fasting for at least 8 hours.   BUN 62 (H) 8 - 23 mg/dL   Creatinine, Ser 2.40 (H) 0.44 - 1.00 mg/dL   Calcium 7.7 (L) 8.9 - 10.3 mg/dL   GFR, Estimated 21 (L) >60 mL/min    Comment: (NOTE) Calculated using the CKD-EPI Creatinine Equation (2021)    Anion gap 8 5 - 15    Comment:  Performed at [facility], [address], [place], [place] [address]  Glucose, capillary     Status: Abnormal   Collection Time: 03/17/21  2:34 AM  Result Value Ref Range   Glucose-Capillary 184 (H) 70 - 99 mg/dL    Comment: Glucose reference range applies only to samples taken after fasting for at least 8 hours.   Comment 1 Notify RN  Comment 2 Document in Chart   Glucose, capillary     Status: Abnormal   Collection Time: 03/17/21  3:30 AM  Result Value Ref Range   Glucose-Capillary 182 (H) 70 - 99 mg/dL    Comment: Glucose reference range applies only to samples taken after fasting for at least 8 hours.  Glucose, capillary     Status: Abnormal   Collection Time: 03/17/21  4:25 AM  Result Value Ref Range   Glucose-Capillary 176 (H) 70 - 99 mg/dL    Comment: Glucose reference range applies only to samples taken after fasting for at least 8 hours.   Comment 1 Notify RN    Comment 2 Document in Chart   Basic metabolic panel     Status: Abnormal   Collection Time: 03/17/21  4:56 AM  Result Value Ref Range   Sodium 137 135 - 145 mmol/L   Potassium 4.7 3.5 - 5.1 mmol/L   Chloride 110 98 - 111 mmol/L   CO2 17 (L) 22 - 32 mmol/L   Glucose, Bld 184 (H) 70 - 99 mg/dL    Comment: Glucose reference range applies only to samples taken after fasting for at least 8 hours.   BUN 64 (H) 8 - 23 mg/dL   Creatinine, Ser 2.41 (H) 0.44 - 1.00 mg/dL   Calcium 7.8 (L) 8.9 - 10.3 mg/dL   GFR, Estimated 21 (L) >60 mL/min    Comment: (NOTE) Calculated using the CKD-EPI Creatinine Equation (2021)    Anion gap 10 5 - 15    Comment: Performed at [facility], [address], [place], [place] [address]  CBC     Status: Abnormal   Collection Time: 03/17/21  4:56 AM  Result Value Ref Range   WBC 21.9 (H) 4.0 - 10.5 K/uL   RBC 2.20 (L) 3.87 - 5.11 MIL/uL   Hemoglobin 6.5 (LL) 12.0 - 15.0 g/dL    Comment: REPEATED TO VERIFY THIS CRITICAL RESULT HAS VERIFIED AND BEEN CALLED TO [facility],V BY SHERRI HUFFINES  ON 03 22 2022 AT 0551, AND HAS BEEN READ BACK.     HCT 20.9 (L) 36.0 - 46.0 %   MCV 95.0 80.0 - 100.0 fL   MCH 29.5 26.0 - 34.0 pg   MCHC 31.1 30.0 - 36.0 g/dL   RDW 16.1 (H) 11.5 - 15.5 %   Platelets 293 150 - 400 K/uL   nRBC 0.1 0.0 - 0.2 %    Comment: Performed at [facility], [address], [place], [place] [address]  Protime-INR     Status: None   Collection Time: 03/17/21  4:56 AM  Result Value Ref Range   Prothrombin Time 14.8 11.4 - 15.2 seconds   INR 1.2 0.8 - 1.2    Comment: (NOTE) INR goal varies based on device and disease states. Performed at [facility], [address], [place], [place] [address]   Beta-hydroxybutyric acid     Status: None   Collection Time: 03/17/21  4:56 AM  Result Value Ref Range   Beta-Hydroxybutyric Acid 0.05 0.05 - 0.27 mmol/L    Comment: Performed at [facility], [address] [place], [place] [address]  ABO/Rh     Status: None   Collection Time: 03/17/21  4:56 AM  Result Value Ref Range   ABO/RH(D)      A POS Performed at [facility], [address], [place], [place] [address]   Procalcitonin - Baseline     Status: None   Collection Time: 03/17/21  4:56 AM  Result Value Ref Range   Procalcitonin 0.31 ng/mL    Comment:        Interpretation: PCT (Procalcitonin) <= 0.5 ng/mL: Systemic infection (sepsis) is not likely. Local bacterial infection is possible. (NOTE)       Sepsis PCT Algorithm           Lower Respiratory Tract                                      Infection PCT Algorithm    ----------------------------     ----------------------------         PCT < 0.25 ng/mL                PCT < 0.10 ng/mL          Strongly encourage             Strongly discourage   discontinuation of antibiotics    initiation of antibiotics    ----------------------------     -----------------------------       PCT 0.25 - 0.50 ng/mL            PCT 0.10 - 0.25 ng/mL               OR       >80% decrease in PCT            Discourage initiation of                                             antibiotics      Encourage discontinuation           of antibiotics    ----------------------------     -----------------------------         PCT >= 0.50 ng/mL              PCT 0.26 - 0.50 ng/mL               AND        <80% decrease in PCT             Encourage initiation of                                             antibiotics       Encourage continuation           of antibiotics    ----------------------------     -----------------------------        PCT >= 0.50 ng/mL                  PCT > 0.50 ng/mL               AND         increase in PCT                  Strongly encourage                                      initiation of antibiotics    Strongly encourage escalation  of antibiotics                                     -----------------------------                                           PCT <= 0.25 ng/mL                                                 OR                                        > 80% decrease in PCT                                      Discontinue / Do not initiate                                             antibiotics  Performed at [facility], [address] [place], [place] [address]   Glucose, capillary     Status: Abnormal   Collection Time: 03/17/21  5:59 AM  Result Value Ref Range   Glucose-Capillary 171 (H) 70 - 99 mg/dL    Comment: Glucose reference range applies only to samples taken after fasting for at least 8 hours.  Glucose, capillary     Status: Abnormal   Collection Time: 03/17/21  6:48 AM  Result Value Ref Range   Glucose-Capillary 157 (H) 70 - 99 mg/dL    Comment: Glucose reference range applies only to samples taken after fasting for at least 8 hours.  Basic metabolic panel     Status: Abnormal   Collection Time: 03/17/21 10:03 AM  Result Value Ref Range   Sodium 136 135 - 145 mmol/L   Potassium 4.3 3.5 - 5.1 mmol/L   Chloride 109 98 - 111 mmol/L   CO2 17 (L) 22 - 32 mmol/L    Glucose, Bld 171 (H) 70 - 99 mg/dL    Comment: Glucose reference range applies only to samples taken after fasting for at least 8 hours.   BUN 66 (H) 8 - 23 mg/dL   Creatinine, Ser 2.50 (H) 0.44 - 1.00 mg/dL   Calcium 7.7 (L) 8.9 - 10.3 mg/dL   GFR, Estimated 20 (L) >60 mL/min    Comment: (NOTE) Calculated using the CKD-EPI Creatinine Equation (2021)    Anion gap 10 5 - 15    Comment: Performed at [facility], [address] [place], [place] [address]  Lactic acid, plasma     Status: Abnormal   Collection Time: 03/17/21 10:03 AM  Result Value Ref Range   Lactic Acid, Venous 2.5 (HH) 0.5 - 1.9 mmol/L    Comment: CRITICAL RESULT CALLED TO, READ BACK BY AND VERIFIED WITH: SHELTON,A 1039 03/17/2021 COLEMAN,R Performed at [facility], [address], [place], [place] [address]   Hepatic  function panel     Status: Abnormal   Collection Time: 03/17/21 10:03 AM  Result Value Ref Range   Total Protein 5.8 (L) 6.5 - 8.1 g/dL   Albumin 2.7 (L) 3.5 - 5.0 g/dL   AST 248 (H) 15 - 41 U/L   ALT 222 (H) 0 - 44 U/L   Alkaline Phosphatase 34 (L) 38 - 126 U/L   Total Bilirubin 0.5 0.3 - 1.2 mg/dL   Bilirubin, Direct 0.1 0.0 - 0.2 mg/dL   Indirect Bilirubin 0.4 0.3 - 0.9 mg/dL    Comment: Performed at [facility], [address] [place], [place] [address]  Glucose, capillary     Status: Abnormal   Collection Time: 03/17/21 10:10 AM  Result Value Ref Range   Glucose-Capillary 164 (H) 70 - 99 mg/dL    Comment: Glucose reference range applies only to samples taken after fasting for at least 8 hours.  Glucose, capillary     Status: Abnormal   Collection Time: 03/17/21 11:18 AM  Result Value Ref Range   Glucose-Capillary 215 (H) 70 - 99 mg/dL    Comment: Glucose reference range applies only to samples taken after fasting for at least 8 hours.  Urinalysis, Routine w reflex microscopic Urine, Clean Catch     Status: Abnormal   Collection Time: 03/17/21 12:08 PM  Result Value Ref Range   Color,  Urine AMBER (A) YELLOW    Comment: BIOCHEMICALS MAY BE AFFECTED BY COLOR   APPearance CLOUDY (A) CLEAR   Specific Gravity, Urine 1.015 1.005 - 1.030   pH 5.0 5.0 - 8.0   Glucose, UA 50 (A) NEGATIVE mg/dL   Hgb urine dipstick SMALL (A) NEGATIVE   Bilirubin Urine NEGATIVE NEGATIVE   Ketones, ur NEGATIVE NEGATIVE mg/dL   Protein, ur >=300 (A) NEGATIVE mg/dL   Nitrite NEGATIVE NEGATIVE   Leukocytes,Ua LARGE (A) NEGATIVE   RBC / HPF 11-20 0 - 5 RBC/hpf   WBC, UA >50 (H) 0 - 5 WBC/hpf   Bacteria, UA MANY (A) NONE SEEN   Squamous Epithelial / LPF 0-5 0 - 5   WBC Clumps PRESENT    Mucus PRESENT    Budding Yeast PRESENT    Hyaline Casts, UA PRESENT     Comment: Performed at [facility], [address], [place], [place] [address]  Culture, Urine     Status: Abnormal (Preliminary result)   Collection Time: 03/17/21 12:08 PM   Specimen: Urine, Clean Catch  Result Value Ref Range   Specimen Description      URINE, CLEAN CATCH Performed at [facility], [address] [place], [place] [address]    Special Requests      NONE Performed at [facility], [address] [place], [place] [address]    Culture (A)     >=100,000 COLONIES/mL GRAM NEGATIVE RODS SUSCEPTIBILITIES TO FOLLOW Performed at [facility], [place] [address], [place], [place] [address]    Report Status PENDING   Lactic acid, plasma     Status: Abnormal   Collection Time: 03/17/21  1:17 PM  Result Value Ref Range   Lactic Acid, Venous 4.4 (HH) 0.5 - 1.9 mmol/L    Comment: CRITICAL RESULT CALLED TO, READ BACK BY AND VERIFIED WITH: SHELTON,A. RN @1435  03/17/21 BILLINGSLEY,L Performed at [facility], [address] [place] [address], [place], [place] [address]   Culture, blood (routine x 2)     Status: None (Preliminary result)   Collection Time: 03/17/21  1:18 PM   Specimen:  BLOOD  Result Value Ref Range   Specimen Description BLOOD LEFT ANTECUBITAL    Special Requests      Blood Culture adequate volume BOTTLES DRAWN AEROBIC AND  ANAEROBIC   Culture      NO GROWTH < 24 HOURS Performed at [facility], [address], [place], [place] [address]    Report Status PENDING   Culture, blood (routine x 2)     Status: None (Preliminary result)   Collection Time: 03/17/21  1:19 PM   Specimen: BLOOD LEFT HAND  Result Value Ref Range   Specimen Description BLOOD LEFT HAND    Special Requests      Blood Culture results may not be optimal due to an inadequate volume of blood received in culture bottles BOTTLES DRAWN AEROBIC ONLY   Culture      NO GROWTH < 24 HOURS Performed at [facility], [address], [place], [place] [address]    Report Status PENDING   Glucose, capillary     Status: Abnormal   Collection Time: 03/17/21  5:04 PM  Result Value Ref Range   Glucose-Capillary 336 (H) 70 - 99 mg/dL    Comment: Glucose reference range applies only to samples taken after fasting for at least 8 hours.  Glucose, capillary     Status: Abnormal   Collection Time: 03/17/21  8:47 PM  Result Value Ref Range   Glucose-Capillary 365 (H) 70 - 99 mg/dL    Comment: Glucose reference range applies only to samples taken after fasting for at least 8 hours.   Comment 1 Notify RN    Comment 2 Document in Chart   Basic metabolic panel     Status: Abnormal   Collection Time: 03/18/21  6:30 AM  Result Value Ref Range   Sodium 131 (L) 135 - 145 mmol/L   Potassium 5.2 (H) 3.5 - 5.1 mmol/L    Comment: DELTA CHECK NOTED   Chloride 104 98 - 111 mmol/L   CO2 13 (L) 22 - 32 mmol/L   Glucose, Bld 323 (H) 70 - 99 mg/dL    Comment: Glucose reference range applies only to samples taken after fasting for at least 8 hours.   BUN 78 (H) 8 - 23 mg/dL   Creatinine, Ser 3.40 (H) 0.44 - 1.00 mg/dL   Calcium 7.4 (L) 8.9 - 10.3 mg/dL   GFR, Estimated 14 (L) >60 mL/min    Comment: (NOTE) Calculated using the CKD-EPI Creatinine Equation (2021)    Anion gap 14 5 - 15    Comment: Performed at [facility], [address], [place], [place] [address]  CBC      Status: Abnormal   Collection Time: 03/18/21  6:30 AM  Result Value Ref Range   WBC 55.8 (HH) 4.0 - 10.5 K/uL    Comment: This critical result has verified and been called to [facility] by Charlie Pitter on 03 23 2022 at 0808, and has been read back.    RBC 2.28 (L) 3.87 - 5.11 MIL/uL   Hemoglobin 6.8 (LL) 12.0 - 15.0 g/dL    Comment: This critical result has verified and been called to [facility] by Charlie Pitter on 03 23 2022 at 0808, and has been read back.    HCT 22.2 (L) 36.0 - 46.0 %   MCV 97.4 80.0 - 100.0 fL   MCH 29.8 26.0 - 34.0 pg   MCHC 30.6 30.0 - 36.0 g/dL   RDW 17.1 (H) 11.5 - 15.5 %   Platelets  292 150 - 400 K/uL   nRBC 0.1 0.0 - 0.2 %    Comment: Performed at [facility], [address] [place], [place] [address]  Protime-INR     Status: Abnormal   Collection Time: 03/18/21  6:30 AM  Result Value Ref Range   Prothrombin Time 18.3 (H) 11.4 - 15.2 seconds   INR 1.6 (H) 0.8 - 1.2    Comment: (NOTE) INR goal varies based on device and disease states. Performed at [facility], [address] [place], [place] [address]   Vitamin B12     Status: Abnormal   Collection Time: 03/18/21  6:30 AM  Result Value Ref Range   Vitamin B-12 1,568 (H) 180 - 914 pg/mL    Comment: RESULTS CONFIRMED BY MANUAL DILUTION (NOTE) This assay is not validated for testing neonatal or myeloproliferative syndrome specimens for Vitamin B12 levels. Performed at [facility], [address] [place], [place] [address]   Folate     Status: None   Collection Time: 03/18/21  6:30 AM  Result Value Ref Range   Folate 6.5 >5.9 ng/mL    Comment: Performed at [facility], [address] [place], [place] [address]  Glucose, capillary     Status: Abnormal   Collection Time: 03/18/21  7:40 AM  Result Value Ref Range   Glucose-Capillary 283 (H) 70 - 99 mg/dL    Comment: Glucose reference range applies only to samples taken after fasting for at least 8 hours.   Comment 1 Notify RN   Hepatic  function panel     Status: Abnormal   Collection Time: 03/18/21  9:24 AM  Result Value Ref Range   Total Protein 5.5 (L) 6.5 - 8.1 g/dL   Albumin 2.6 (L) 3.5 - 5.0 g/dL   AST 804 (H) 15 - 41 U/L   ALT 794 (H) 0 - 44 U/L   Alkaline Phosphatase 40 38 - 126 U/L   Total Bilirubin 0.6 0.3 - 1.2 mg/dL   Bilirubin, Direct 0.1 0.0 - 0.2 mg/dL   Indirect Bilirubin 0.5 0.3 - 0.9 mg/dL    Comment: Performed at [facility], [address] [place],  [address]  Procalcitonin - Baseline     Status: None   Collection Time: 03/18/21  9:24 AM  Result Value Ref Range   Procalcitonin 1.08 ng/mL    Comment:        Interpretation: PCT > 0.5 ng/mL and <= 2 ng/mL: Systemic infection (sepsis) is possible, but other conditions are known to elevate PCT as well. (NOTE)       Sepsis PCT Algorithm           Lower Respiratory Tract                                      Infection PCT Algorithm    ----------------------------     ----------------------------         PCT < 0.25 ng/mL                PCT < 0.10 ng/mL          Strongly encourage             Strongly discourage   discontinuation of antibiotics    initiation of antibiotics    ----------------------------     -----------------------------       PCT 0.25 - 0.50 ng/mL  PCT 0.10 - 0.25 ng/mL               OR       >80% decrease in PCT            Discourage initiation of                                            antibiotics      Encourage discontinuation           of antibiotics    ----------------------------     -----------------------------         PCT >= 0.50 ng/mL              PCT 0.26 - 0.50 ng/mL                AND       <80% decrease in PCT             Encourage initiation of                                             antibiotics       Encourage continuation           of antibiotics    ----------------------------     -----------------------------        PCT >= 0.50 ng/mL                  PCT > 0.50 ng/mL                AND         increase in PCT                  Strongly encourage                                      initiation of antibiotics    Strongly encourage escalation           of antibiotics                                     -----------------------------                                           PCT <= 0.25 ng/mL                                                 OR                                        > 80% decrease in PCT  Discontinue / Do not initiate                                             antibiotics  Performed at [facility], [address], [place], [place] [address]   Lactic acid, plasma     Status: Abnormal   Collection Time: 03/18/21 10:13 AM  Result Value Ref Range   Lactic Acid, Venous 4.4 (HH) 0.5 - 1.9 mmol/L    Comment: CRITICAL RESULT CALLED TO, READ BACK BY AND VERIFIED WITH: Beatris Si [id number] 03/18/2021 COLEMAN,R Performed at [facility], [address] [place], [place] [address]   Occult blood card to lab, stool RN will collect     Status: Abnormal   Collection Time: 03/18/21 11:38 AM  Result Value Ref Range   Fecal Occult Bld POSITIVE (A) NEGATIVE    Comment: Performed at [facility], [address] [place], [place] [address]  Glucose, capillary     Status: Abnormal   Collection Time: 03/18/21 11:43 AM  Result Value Ref Range   Glucose-Capillary 297 (H) 70 - 99 mg/dL    Comment: Glucose reference range applies only to samples taken after fasting for at least 8 hours.    CT ABDOMEN PELVIS WO CONTRAST  Result Date: 03/17/2021 CLINICAL DATA:  Nonlocalized abdominal pain. Increased lactic acid. Lethargy. Confusion. EXAM: CT ABDOMEN AND PELVIS WITHOUT CONTRAST TECHNIQUE: Multidetector CT imaging of the abdomen and pelvis was performed following the standard protocol without IV contrast. COMPARISON:  01/17/2013 FINDINGS: Lower chest: Left lower lobe 9 mm pulmonary nodule on 25/4 is similar in 2014 and considered benign. Bibasilar  subsegmental atelectasis. Mild cardiomegaly with pacer. Distal right coronary artery calcification. Small bilateral pleural effusions. Hepatobiliary: Small volume perihepatic ascites. Subtle hypoattenuation involving the anterior portion of segment 4 B, including on 28/2. On the order of 5.0 cm. 2.0 cm gallstone with gallbladder wall thickening up to 9 mm. No biliary duct dilatation. Pancreas: Fatty replacement involving the pancreatic head and uncinate process. Spleen: Normal in size, without focal abnormality. Adrenals/Urinary Tract: Normal adrenal glands. Mild renal cortical thinning bilaterally. Interpolar right renal exophytic 7.4 cm fluid density lesion with calcification in its wall. Trace air within the nondependent bladder including on 79/2. No ureteric or bladder calculi. Stomach/Bowel: Proximal gastric underdistention. Colonic stool burden suggests constipation. Normal terminal ileum and appendix. Normal small bowel. Vascular/Lymphatic: Advanced aortic and branch vessel atherosclerosis. Retroaortic left renal vein. No abdominopelvic adenopathy. Reproductive: Normal uterus and adnexa. Other: Small volume abdominopelvic fluid including adjacent the liver and spleen. Musculoskeletal: Mild anasarca. Remote eighth posterior left rib fracture. IMPRESSION: 1. Cholelithiasis with gallbladder wall thickening. Correlate with right upper quadrant symptoms and possibly ultrasound to exclude acute cholecystitis. 2.  Possible constipation. 3. Bilateral small pleural effusions and abdominopelvic ascites with anasarca. Question fluid overload. 4. Coronary artery atherosclerosis. Aortic Atherosclerosis (ICD10-I70.0). 5. Segment 4 B hypoattenuation could represent prominent focal steatosis but is indeterminate. Consider nonemergent, outpatient pre and post contrast abdominal MRI. 6. Trace air within the bladder.  Correlate with instrumentation. Electronically Signed   By: Abigail Miyamoto M.D.   On: 03/17/2021 16:38   US  Abdomen Complete  Result Date: 03/18/2021 CLINICAL DATA:  Cholelithiasis, acute renal failure EXAM: ABDOMEN ULTRASOUND COMPLETE COMPARISON:  CT 03/17/2021 FINDINGS: Gallbladder: The gallbladder is distended. Several shadowing gallstones are seen impacted within the gallbladder neck and there is layering sludge within  the gallbladder. The gallbladder wall is markedly thickened measuring up to 14 mm in diameter and mild pericholecystic fluid is identified. The sonographic Percell Miller sign is reportedly positive. Common bile duct: Diameter: 4-5 mm in proximal diameter. Liver: No focal lesion identified. Within normal limits in parenchymal echogenicity. Portal vein is patent on color Doppler imaging with normal direction of blood flow towards the liver. IVC: No abnormality visualized. Pancreas: Visualized portion unremarkable. Spleen: Size and appearance within normal limits. Right Kidney: Length: 10.9 cm. Echogenicity within normal limits. No mass or hydronephrosis visualized. 8.1 cm simple cortical cyst identified within the upper pole. Left Kidney: Length: 11.0 cm. Echogenicity within normal limits. No mass or hydronephrosis visualized. Abdominal aorta: No aneurysm visualized, though the distal aorta is obscured by overlying bowel gas. Other findings: Small right pleural effusion is present. Mild perihepatic and perisplenic ascites is noted. IMPRESSION: Acute calculus cholecystitis. Mild ascites.  Small right pleural effusion. 8.1 cm right upper pole simple cortical cyst. This is best characterized as a Bosniak class 1 cyst and further follow-up is not required. Electronically Signed   By: Fidela Salisbury MD   On: 03/18/2021 12:51   US Abdomen Limited  Result Date: 03/18/2021 CLINICAL DATA:  Right upper quadrant abdominal pain EXAM: ULTRASOUND ABDOMEN LIMITED RIGHT UPPER QUADRANT COMPARISON:  CT 03/17/2021 FINDINGS: Gallbladder: The gallbladder is distended, there is marked gallbladder wall thickening, and mild  pericholecystic fluid identified. The gallbladder contains layering sludge and stones. Stones are seen within the gallbladder neck, however, decubitus positioning was not performed to assess for impaction. The sonographic Percell Miller sign is reportedly positive. Common bile duct: Diameter: 4 mm in proximal diameter Liver: No focal lesion identified. Within normal limits in parenchymal echogenicity. Portal vein is patent on color Doppler imaging with normal direction of blood flow towards the liver. Other: Mild perihepatic ascites is present. Small right pleural effusion is noted. 8.9 cm simple cortical cyst is noted within the upper pole of the right kidney. IMPRESSION: Acute calculus cholecystitis. Mild perihepatic ascites and small right pleural effusion. Electronically Signed   By: Fidela Salisbury MD   On: 03/18/2021 12:46    ROS:  Pertinent items are noted in HPI.  Blood pressure (!) 97/36, pulse 66, temperature (!) 97.5 F (36.4 C), temperature source Oral, resp. rate 17, height 5\' 6"  (1.676 m), weight 82 kg, SpO2 99 %. Physical Exam: Pleasant white female in no acute distress Eyes without scleral icterus Head is normocephalic, atraumatic Lungs clear auscultation with good breath sounds bilaterally Heart examination reveals regular rate and rhythm without S3, S4, murmurs Abdomen soft with tenderness noted in the right upper quadrant to palpation.  Difficult to assess for hepatosplenomegaly due to body habitus.  No rigidity is noted. Labs and ultrasound reports reviewed. Assessment/Plan: Probable acute cholecystitis secondary to cholelithiasis.  Patient does not have evidence of cholangitis.  Her liver enzyme tests are worsening. I think patient would benefit from IR placed cholecystostomy tube.  Patient will not tolerate general anesthesia for laparoscopic cholecystectomy at this time.  Aviva Signs 03/18/2021, 1:09 PM

## 2021-03-18 NOTE — Progress Notes (Addendum)
PROGRESS NOTE  Tracey Bryant XHB:[id number] DOB: 10-01-1951 DOA: 03/16/2021 PCP: Coolidge Breeze, FNP  Brief History:  70 year old female with a history of diabetes mellitus type 2, cardiomyopathy, hypertension, stroke presenting with 2-day history of nausea, vomiting, and generalized weakness.  In addition, the patient developed abdominal pain on the morning of 03/16/2021.  She presented with epistaxis to the ED.  She was treated with Afrin and cotton balls with improvement and discharged home on 03/16/2021.  The patient had denied any fevers, chills, chest pain, shortness breath, diarrhea, hematochezia, melena. In the emergency department, the patient was afebrile and hemodynamically stable.  Serum glucose was noted to be 416 oh with anion gap of 17.  The patient was started on intravenous insulin and IV fluids.  She was also noted to be anemic.  She is a Restaurant manager, fast food.  WBC was noted to be 21.9.  She was started on Zosyn.  CT of the abdomen and pelvis showed small perihepatic ascites, cholelithiasis with gallbladder wall thickening without biliary ductal dilatation.  Assessment/Plan: DKA type II -patient started on IV insulin with q 1 hour CBG check and q 4 hour BMPs -pt started on aggressive fluid resuscitation -Electrolytes were monitored and repleted -transitioned to [place] insulin once anion gap closed -diet was advanced once anion gap closed -HbA1C--9.1  Abdominal pain/cholelithiasis -3/23 CT abd as above -obtain abd [place] -concerned about cholecystitis -general surgery consulted -Continue empiric Zosyn -check lactate, PCT  Leukemoid reaction -pt received Granix on 3/22 at 1700 -WBC up from 21.9>>55.8 -follow 3/22 blood and urine cultures  AKI -baseline creatinine ~1.6-1.7 -serum creatinine peaked 3.40 -abd US -obtain UA -Hold furosemide  Acute blood loss anemia -Secondary to epistaxis -FOBT negative x2 -Patient is Jehovah's Witness -Iron saturation 14,  ferritin 99 -Continue ferrous sulfate  Uncontrolled diabetes mellitus type 2 with hyperglycemia -Continue Lantus 25 units at bedtime -Start Lantus 12 units in the morning -Increase to moderate sliding scale -03/16/2021 hemoglobin A1c 9.1 -Discontinue glipizide -pt states she frequently misses her insulin doses at home  History of stroke -Apixaban on hold secondary to acute blood loss anemia  Hyperlipidemia -Continue Zetia  Hyperkalemia -lokelma x 1     Status is: Inpatient  Remains inpatient appropriate because:Hemodynamically unstable, Unsafe d/c plan and IV treatments appropriate due to intensity of illness or inability to take PO   Dispo: The patient is from: Home              Anticipated d/c is to: Home              Patient currently is not medically stable to d/c.   Difficult to place patient No        Family Communication:  no Family at bedside  Consultants:  General surgery  Code Status:  FULL   DVT Prophylaxis:  SCDs   Procedures: As Listed in Progress Note Above  Antibiotics: Zosyn 3/22>>     Subjective: Pt complains of nausea and abd pain.  States abd pain not better since admission.  She denies any fevers, chills, chest pain, shortness breath, coughing, hemoptysis, diarrhea, dysuria, hematuria.  Objective: Vitals:   03/18/21 0500 03/18/21 0700 03/18/21 0743 03/18/21 0800  BP: 116/61   (!) 118/43  Pulse: 71 70  68  Resp: 19 13  (!) 24  Temp:   (!) 97.3 F (36.3 C)   TempSrc:   Oral   SpO2: 97% 100%  100%  Weight:  Height:        Intake/Output Summary (Last 24 hours) at 03/18/2021 0908 Last data filed at 03/18/2021 0815 Gross per 24 hour  Intake 1870.97 ml  Output 300 ml  Net 1570.97 ml   Weight change: 7 kg Exam:   General:  Pt is alert, follows commands appropriately, not in acute distress  HEENT: No icterus, No thrush, No neck mass, [place]/AT  Cardiovascular: RRR, S1/S2, no rubs, no gallops  Respiratory: bibasilar  rales. No wheeze  Abdomen: Soft/+BS, non tender, non distended, no guarding  Extremities: No edema, No lymphangitis, No petechiae, No rashes, no synovitis   Data Reviewed: I have personally reviewed following labs and imaging studies Basic Metabolic Panel: Recent Labs  Lab 03/16/21 2003 03/17/21 0221 03/17/21 0456 03/17/21 1003 03/18/21 0630  NA 136 135 137 136 131*  K 4.7 4.8 4.7 4.3 5.2*  CL 111 110 110 109 104  CO2 14* 17* 17* 17* 13*  GLUCOSE 219* 204* 184* 171* 323*  BUN 62* 62* 64* 66* 78*  CREATININE 2.33* 2.40* 2.41* 2.50* 3.40*  CALCIUM 7.7* 7.7* 7.8* 7.7* 7.4*   Liver Function Tests: Recent Labs  Lab 03/16/21 1300 03/17/21 1003  AST 45* 248*  ALT 29 222*  ALKPHOS 37* 34*  BILITOT 1.0 0.5  PROT 6.3* 5.8*  ALBUMIN 2.9* 2.7*   Recent Labs  Lab 03/16/21 1300  LIPASE 32   No results for input(s): AMMONIA in the last 168 hours. Coagulation Profile: Recent Labs  Lab 03/17/21 0456 03/18/21 0630  INR 1.2 1.6*   CBC: Recent Labs  Lab 03/15/21 1316 03/16/21 1300 03/17/21 0456 03/18/21 0630  WBC 13.8* 16.3* 21.9* 55.8*  NEUTROABS 10.7* 13.7*  --   --   HGB 8.5* 7.1* 6.5* 6.8*  HCT 26.8* 22.8* 20.9* 22.2*  MCV 92.4 94.6 95.0 97.4  PLT 323 314 293 292   Cardiac Enzymes: No results for input(s): CKTOTAL, CKMB, CKMBINDEX, TROPONINI in the last 168 hours. BNP: Invalid input(s): POCBNP CBG: Recent Labs  Lab 03/17/21 1010 03/17/21 1118 03/17/21 1704 03/17/21 2047 03/18/21 0740  GLUCAP 164* 215* 336* 365* 283*   HbA1C: Recent Labs    03/16/21 1521  HGBA1C 9.1*   Urine analysis:    Component Value Date/Time   COLORURINE AMBER (A) 03/17/2021 1208   APPEARANCEUR CLOUDY (A) 03/17/2021 1208   LABSPEC 1.015 03/17/2021 1208   PHURINE 5.0 03/17/2021 1208   GLUCOSEU 50 (A) 03/17/2021 1208   HGBUR SMALL (A) 03/17/2021 1208   BILIRUBINUR NEGATIVE 03/17/2021 1208   KETONESUR NEGATIVE 03/17/2021 1208   PROTEINUR >=300 (A) 03/17/2021 1208    UROBILINOGEN 0.2 01/17/2013 1910   NITRITE NEGATIVE 03/17/2021 1208   LEUKOCYTESUR LARGE (A) 03/17/2021 1208   Sepsis Labs: @LABRCNTIP (procalcitonin:4,lacticidven:4) ) Recent Results (from the past 240 hour(s))  SARS CORONAVIRUS 2 ([place] 6-24 HRS) Nasopharyngeal Nasopharyngeal Swab     Status: None   Collection Time: 03/16/21  1:32 PM   Specimen: Nasopharyngeal Swab  Result Value Ref Range Status   SARS Coronavirus 2 NEGATIVE NEGATIVE Final    Comment: (NOTE) SARS-CoV-2 target nucleic acids are NOT DETECTED.  The SARS-CoV-2 RNA is generally detectable in upper and lower respiratory specimens during the acute phase of infection. Negative results do not preclude SARS-CoV-2 infection, do not rule out co-infections with other pathogens, and should not be used as the sole basis for treatment or other patient management decisions. Negative results must be combined with clinical observations, patient history, and epidemiological information. The expected result is  Negative.  Fact Sheet for Patients: SugarRoll.be  Fact Sheet for Healthcare Providers: https://www.woods-mathews.com/  This test is not yet approved or cleared by the [place] FDA and  has been authorized for detection and/or diagnosis of SARS-CoV-2 by FDA under an Emergency Use Authorization (EUA). This EUA will remain  in effect (meaning this test can be used) for the duration of the COVID-19 declaration under Se ction 564(b)(1) of the Act, 21 U.S.C. section 360bbb-3(b)(1), unless the authorization is terminated or revoked sooner.  Performed at [facility], [place] [address], [place], [place] [address]   MRSA PCR Screening     Status: None   Collection Time: 03/16/21  5:06 PM   Specimen: Nasal Mucosa; Nasopharyngeal  Result Value Ref Range Status   MRSA by PCR NEGATIVE NEGATIVE Final    Comment:        The GeneXpert MRSA Assay (FDA approved for NASAL  specimens only), is one component of a comprehensive MRSA colonization surveillance program. It is not intended to diagnose MRSA infection nor to guide or monitor treatment for MRSA infections. Performed at [facility], [address] [place], [place] [address]   Culture, blood (routine x 2)     Status: None (Preliminary result)   Collection Time: 03/17/21  1:18 PM   Specimen: BLOOD  Result Value Ref Range Status   Specimen Description BLOOD LEFT ANTECUBITAL  Final   Special Requests   Final    Blood Culture adequate volume BOTTLES DRAWN AEROBIC AND ANAEROBIC Performed at [facility], [address] [place], [place] [address]    Culture PENDING  Incomplete   Report Status PENDING  Incomplete  Culture, blood (routine x 2)     Status: None (Preliminary result)   Collection Time: 03/17/21  1:19 PM   Specimen: BLOOD LEFT HAND  Result Value Ref Range Status   Specimen Description BLOOD LEFT HAND  Final   Special Requests   Final    Blood Culture results may not be optimal due to an inadequate volume of blood received in culture bottles BOTTLES DRAWN AEROBIC ONLY Performed at [facility], [address] [place], [place] [address]    Culture PENDING  Incomplete   Report Status PENDING  Incomplete     Scheduled Meds: . Chlorhexidine Gluconate Cloth  6 each Topical Daily  . ezetimibe  10 mg Oral Daily  . ferrous sulfate  325 mg Oral BID WC  . furosemide  40 mg Oral Daily  . glipiZIDE  5 mg Oral Q breakfast  . insulin aspart  0-15 Units Subcutaneous TID WC  . insulin aspart  0-5 Units Subcutaneous QHS  . insulin glargine  12 Units Subcutaneous Daily  . insulin glargine  25 Units Subcutaneous QPM  . metoCLOPramide (REGLAN) injection  10 mg Intravenous Q8H  . sodium chloride flush  3 mL Intravenous Q12H   Continuous Infusions: . lactated ringers 125 mL/hr at 03/18/21 0815  . piperacillin-tazobactam (ZOSYN)  IV 12.5 mL/hr at 03/18/21 0815    Procedures/Studies: CT ABDOMEN  PELVIS WO CONTRAST  Result Date: 03/17/2021 CLINICAL DATA:  Nonlocalized abdominal pain. Increased lactic acid. Lethargy. Confusion. EXAM: CT ABDOMEN AND PELVIS WITHOUT CONTRAST TECHNIQUE: Multidetector CT imaging of the abdomen and pelvis was performed following the standard protocol without IV contrast. COMPARISON:  01/17/2013 FINDINGS: Lower chest: Left lower lobe 9 mm pulmonary nodule on 25/4 is similar in 2014 and considered benign. Bibasilar subsegmental atelectasis. Mild cardiomegaly with pacer. Distal right coronary artery calcification. Small  bilateral pleural effusions. Hepatobiliary: Small volume perihepatic ascites. Subtle hypoattenuation involving the anterior portion of segment 4 B, including on 28/2. On the order of 5.0 cm. 2.0 cm gallstone with gallbladder wall thickening up to 9 mm. No biliary duct dilatation. Pancreas: Fatty replacement involving the pancreatic head and uncinate process. Spleen: Normal in size, without focal abnormality. Adrenals/Urinary Tract: Normal adrenal glands. Mild renal cortical thinning bilaterally. Interpolar right renal exophytic 7.4 cm fluid density lesion with calcification in its wall. Trace air within the nondependent bladder including on 79/2. No ureteric or bladder calculi. Stomach/Bowel: Proximal gastric underdistention. Colonic stool burden suggests constipation. Normal terminal ileum and appendix. Normal small bowel. Vascular/Lymphatic: Advanced aortic and branch vessel atherosclerosis. Retroaortic left renal vein. No abdominopelvic adenopathy. Reproductive: Normal uterus and adnexa. Other: Small volume abdominopelvic fluid including adjacent the liver and spleen. Musculoskeletal: Mild anasarca. Remote eighth posterior left rib fracture. IMPRESSION: 1. Cholelithiasis with gallbladder wall thickening. Correlate with right upper quadrant symptoms and possibly ultrasound to exclude acute cholecystitis. 2.  Possible constipation. 3. Bilateral small pleural  effusions and abdominopelvic ascites with anasarca. Question fluid overload. 4. Coronary artery atherosclerosis. Aortic Atherosclerosis (ICD10-I70.0). 5. Segment 4 B hypoattenuation could represent prominent focal steatosis but is indeterminate. Consider nonemergent, outpatient pre and post contrast abdominal MRI. 6. Trace air within the bladder.  Correlate with instrumentation. Electronically Signed   By: Abigail Miyamoto M.D.   On: 03/17/2021 16:38    Orson Eva, DO  [facility]  If 7PM-7AM, please contact night-coverage [email or web address] Password [email or web address] 03/18/2021, 9:08 AM   LOS: 2 days

## 2021-03-18 NOTE — Progress Notes (Signed)
*  PRELIMINARY RESULTS* Echocardiogram 2D Echocardiogram has been performed.  Tracey Bryant 03/18/2021, 11:37 AM

## 2021-03-18 NOTE — Progress Notes (Signed)
PHARMACY NOTE:  ANTIMICROBIAL RENAL DOSAGE ADJUSTMENT  Current antimicrobial regimen includes a mismatch between antimicrobial dosage and estimated renal function.  As per policy approved by the Pharmacy & Therapeutics and Medical Executive Committees, the antimicrobial dosage will be adjusted accordingly.  Current antimicrobial dosage:  Zosyn 3.375g IV q8h EID over 4 hours  Indication: Cholecystitis  Renal Function:  Estimated Creatinine Clearance: 16.9 mL/min (A) (by C-G formula based on SCr of 3.4 mg/dL (H)). []      On intermittent HD, scheduled: []      On CRRT    Antimicrobial dosage has been changed to:  Zosyn 3.375g IV q12h EID over 4 hours Additional comments:   Thank you for allowing pharmacy to be a part of this patient's care.  Isac Sarna, BS [place], [place] Clinical Pharmacist Pager [phone] 03/18/2021 12:09 PM

## 2021-03-18 NOTE — TOC Progression Note (Signed)
Transition of Care [facility]) - Progression Note    Patient Details  Name: Tracey Bryant MRN: [id number] Date of Birth: [birth year]  [facility] CM/SW Contact  Salome Arnt, [place] Phone Number: 03/18/2021, 12:22 PM  Clinical Narrative:  LCSW followed up with pt to discuss discharge planning. Pt states she is not feeling any better today. She said she is planning on going to her son's house at discharge. LCSW discussed PT evaluation recommending SNF. Pt immediately states, "No way." She requests LCSW to call her son to provide options and then they will discuss together tonight. LCSW spoke with son, [name] who confirms that they had talked about pt moving in with him. However, he admits this is not ideal due to his long work hours. Pt had mentioned she may have a friend come stay with her during the day. [name] is aware of SNF vs home health and will talk to pt this evening. He will follow up with TOC in AM.      Expected Discharge Plan: [place] Barriers to Discharge: Continued Medical Work up  Expected Discharge Plan and Services Expected Discharge Plan: [place] In-house Referral: Clinical Social Work     Living arrangements for the past 2 months: Single Family Home                                       Social Determinants of Health (SDOH) Interventions    Readmission Risk Interventions Readmission Risk Prevention Plan 03/17/2021  Transportation Screening Complete  Medication Review (RN CM) Complete  Some recent data might be hidden

## 2021-03-19 ENCOUNTER — Inpatient Hospital Stay (HOSPITAL_COMMUNITY): Payer: Medicare HMO

## 2021-03-19 DIAGNOSIS — N390 Urinary tract infection, site not specified: Secondary | ICD-10-CM

## 2021-03-19 DIAGNOSIS — R6520 Severe sepsis without septic shock: Secondary | ICD-10-CM

## 2021-03-19 DIAGNOSIS — K810 Acute cholecystitis: Secondary | ICD-10-CM

## 2021-03-19 DIAGNOSIS — K811 Chronic cholecystitis: Secondary | ICD-10-CM

## 2021-03-19 DIAGNOSIS — A419 Sepsis, unspecified organism: Secondary | ICD-10-CM

## 2021-03-19 DIAGNOSIS — E0810 Diabetes mellitus due to underlying condition with ketoacidosis without coma: Secondary | ICD-10-CM | POA: Diagnosis not present

## 2021-03-19 DIAGNOSIS — Z794 Long term (current) use of insulin: Secondary | ICD-10-CM | POA: Diagnosis not present

## 2021-03-19 DIAGNOSIS — E1165 Type 2 diabetes mellitus with hyperglycemia: Secondary | ICD-10-CM | POA: Diagnosis not present

## 2021-03-19 LAB — COMPREHENSIVE METABOLIC PANEL
ALT: 792 U/L — ABNORMAL HIGH (ref 0–44)
AST: 396 U/L — ABNORMAL HIGH (ref 15–41)
Albumin: 2.5 g/dL — ABNORMAL LOW (ref 3.5–5.0)
Alkaline Phosphatase: 44 U/L (ref 38–126)
Anion gap: 15 (ref 5–15)
BUN: 85 mg/dL — ABNORMAL HIGH (ref 8–23)
CO2: 14 mmol/L — ABNORMAL LOW (ref 22–32)
Calcium: 7.1 mg/dL — ABNORMAL LOW (ref 8.9–10.3)
Chloride: 106 mmol/L (ref 98–111)
Creatinine, Ser: 3.59 mg/dL — ABNORMAL HIGH (ref 0.44–1.00)
GFR, Estimated: 13 mL/min — ABNORMAL LOW (ref >60)
Glucose, Bld: 184 mg/dL — ABNORMAL HIGH (ref 70–99)
Potassium: 4.1 mmol/L (ref 3.5–5.1)
Sodium: 135 mmol/L (ref 135–145)
Total Bilirubin: 0.7 mg/dL (ref 0.3–1.2)
Total Protein: 5.5 g/dL — ABNORMAL LOW (ref 6.5–8.1)

## 2021-03-19 LAB — URINE CULTURE: Culture: >=100000 — AB

## 2021-03-19 LAB — TYPE AND SCREEN
ABO/RH(D): A POS
Antibody Screen: NEGATIVE

## 2021-03-19 LAB — CBC
HCT: 21.9 % — ABNORMAL LOW (ref 36.0–46.0)
Hemoglobin: 6.9 g/dL — CL (ref 12.0–15.0)
MCH: 30.4 pg (ref 26.0–34.0)
MCHC: 31.5 g/dL (ref 30.0–36.0)
MCV: 96.5 fL (ref 80.0–100.0)
Platelets: 298 10*3/uL (ref 150–400)
RBC: 2.27 MIL/uL — ABNORMAL LOW (ref 3.87–5.11)
RDW: 17.5 % — ABNORMAL HIGH (ref 11.5–15.5)
WBC: 50.6 10*3/uL (ref 4.0–10.5)
nRBC: 0.5 % — ABNORMAL HIGH (ref 0.0–0.2)

## 2021-03-19 LAB — GLUCOSE, CAPILLARY
Glucose-Capillary: 189 mg/dL — ABNORMAL HIGH (ref 70–99)
Glucose-Capillary: 156 mg/dL — ABNORMAL HIGH (ref 70–99)
Glucose-Capillary: 125 mg/dL — ABNORMAL HIGH (ref 70–99)
Glucose-Capillary: 183 mg/dL — ABNORMAL HIGH (ref 70–99)

## 2021-03-19 LAB — PROTIME-INR
INR: 1.4 — ABNORMAL HIGH (ref 0.8–1.2)
Prothrombin Time: 16.9 seconds — ABNORMAL HIGH (ref 11.4–15.2)

## 2021-03-19 IMAGING — CT CT IMAGE GUIDED DRAINAGE BY PERCUTANEOUS CATHETER
1 of 3 series · 13 of 32 positions shown, 18 images · non-contrast
Comparison: [DATE]

INDICATION: Acute cholecystitis

EXAM:
CT -GUIDED CHOLECYSTOSTOMY TUBE PLACEMENT

[Series 2: i-spiral 5.0 b40f · axial · 0.92mm/px · z∈[+1059,+1315]mm · 13 of 83 slices shown, 18 images]
[im 5/83  soft-tissue]
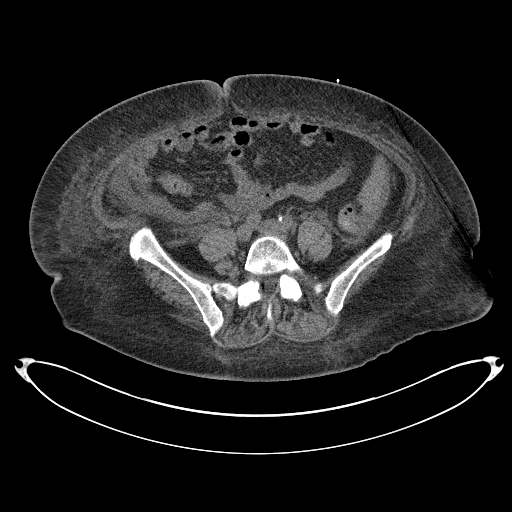
[im 5/83  bone]
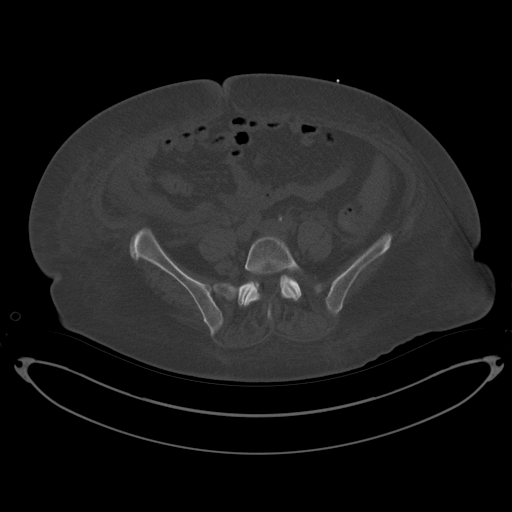
[im 15/83  soft-tissue]
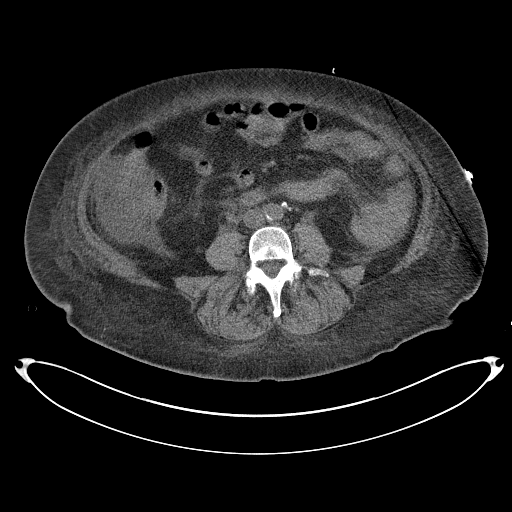
[im 20/83  soft-tissue]
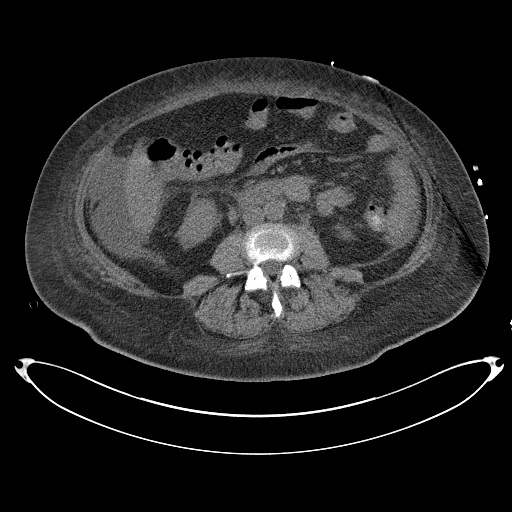
[im 25/83  soft-tissue]
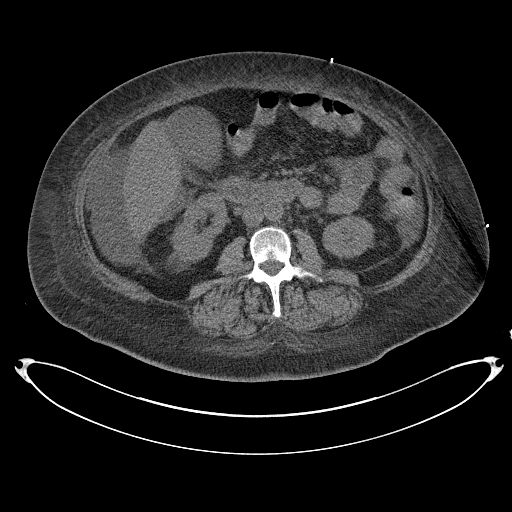
[im 34/83  soft-tissue]
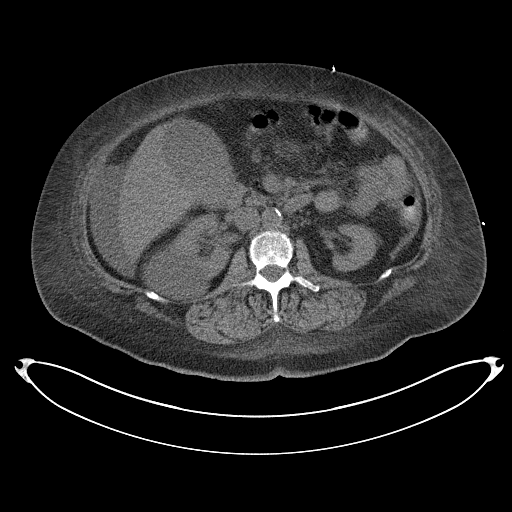
[im 39/83  soft-tissue]
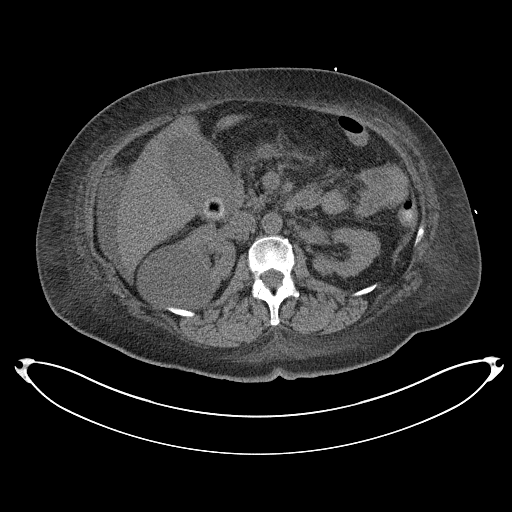
[im 44/83  soft-tissue]
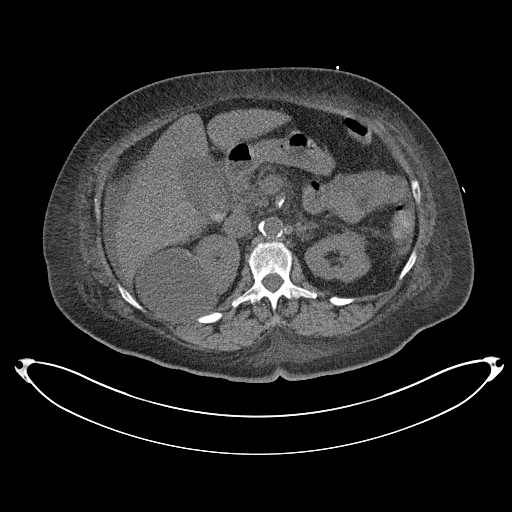
[im 54/83  soft-tissue]
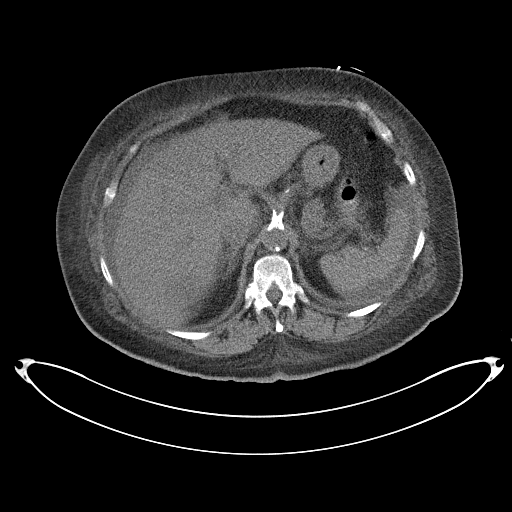
[im 58/83  soft-tissue]
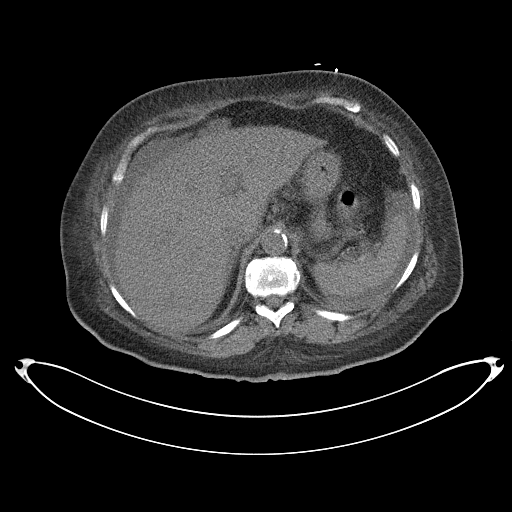
[im 58/83  bone]
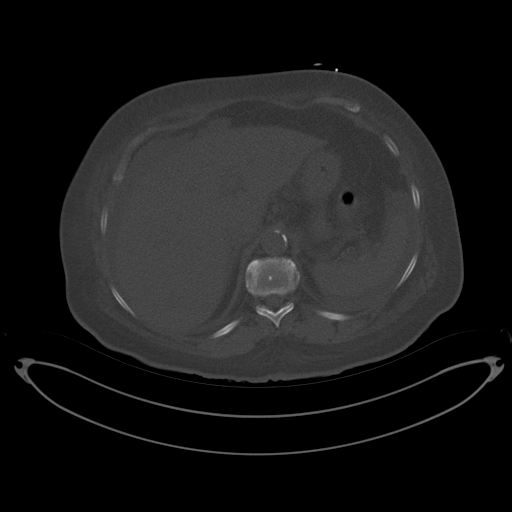
[im 63/83  soft-tissue]
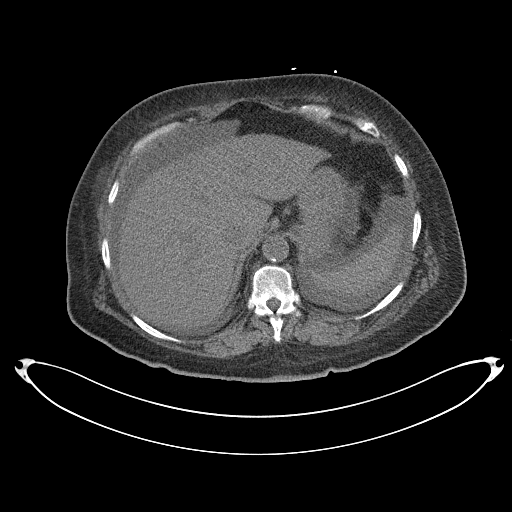
[im 63/83  lung]
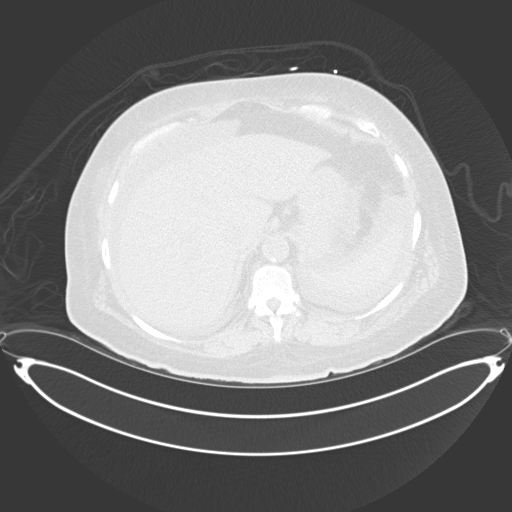
[im 68/83  lung]
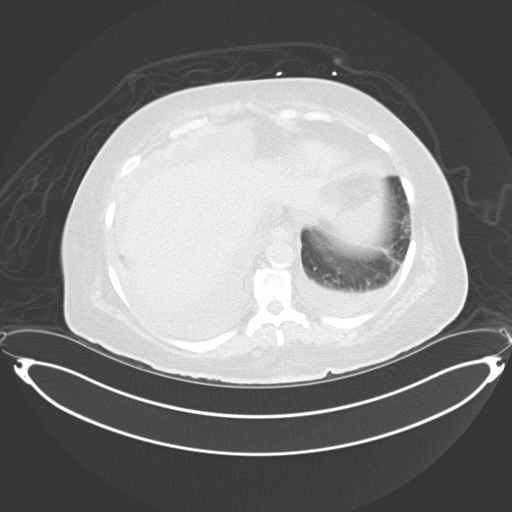
[im 73/83  soft-tissue]
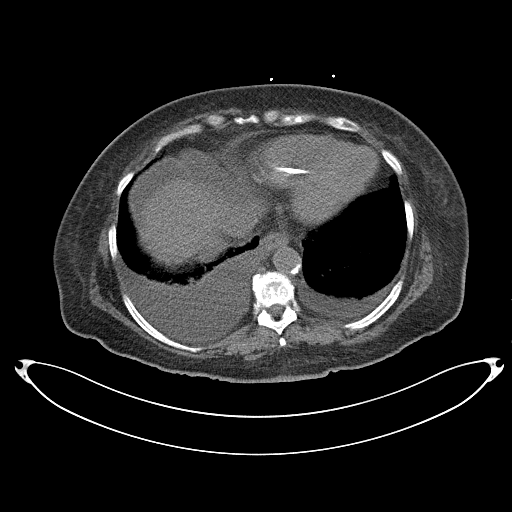
[im 73/83  lung]
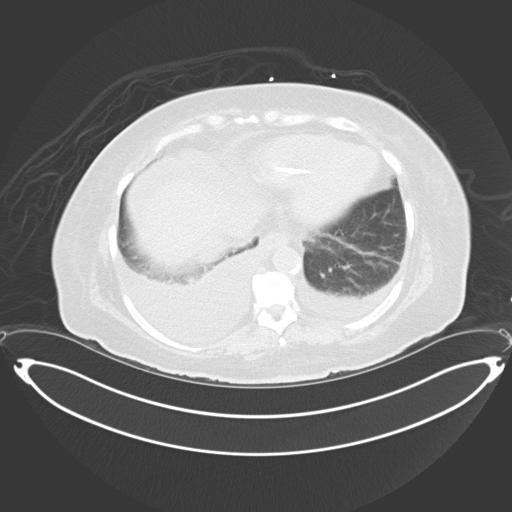
[im 78/83  soft-tissue]
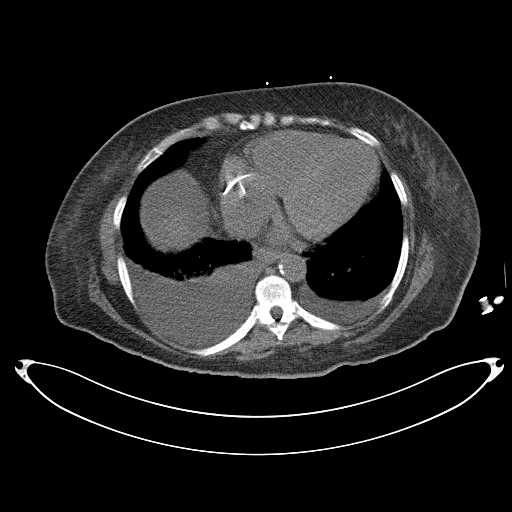
[im 78/83  lung]
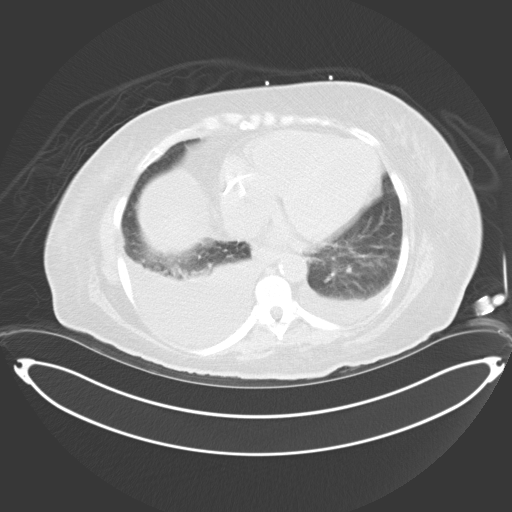

[13 of 32 positions shown; findings below may reference images not displayed]

MEDICATIONS:
The patient is currently admitted to the hospital and on intravenous
antibiotics. Antibiotics were administered within an appropriate
time frame prior to skin puncture.

ANESTHESIA/SEDATION:
Moderate (conscious) sedation was employed during this procedure. A
total of Versed 1.5 mg and Fentanyl 75 mcg was administered
intravenously.

Moderate Sedation Time: 20 minutes. The patient's level of
consciousness and vital signs were monitored continuously by
radiology nursing throughout the procedure under my direct
supervision.

CONTRAST:  None.

FLUOROSCOPY TIME:  None.

COMPLICATIONS:
None immediate.

PROCEDURE:
Informed written consent was obtained from the patient after a
discussion of the risks, benefits and alternatives to treatment.
Questions regarding the procedure were encouraged and answered. A
timeout was performed prior to the initiation of the procedure.

The right upper abdominal quadrant was prepped and draped in the
usual sterile fashion, and a sterile drape was applied covering the
operative field. Maximum barrier sterile technique with sterile
gowns and gloves were used for the procedure. A timeout was
performed prior to the initiation of the procedure. Local anesthesia
was provided with 1% lidocaine with epinephrine.

Limited upper abdominal CT demonstrates a markedly dilated
gallbladder. Utilizing a transhepatic approach, an 18 gauge trocar
needle was advanced into the gallbladder under intermittent CT
guidance. An Amplatz wire was inserted and coiled within the
gallbladder. Appropriate position was confirmed with limited CT.
Serial dilation was performed and ultimately a 10.2-DANII
multipurpose drainage tube was advanced into the gallbladder
infundibulum, coiled and locked. Limited upper abdominal CT was then
obtained to confirm adequate positioning. Bile was aspirated. The
catheter was secured to the skin with suture, connected to a
drainage bag and a dressing was placed. The patient tolerated the
procedure well without immediate post procedural complication.
IMPRESSION: Successful CT guided placement of a 10.2 French cholecystostomy
tube.

PLAN:
Follow-up with cholecystostomy tube check and change in 2 months.

## 2021-03-19 MED ORDER — LIVING WELL WITH DIABETES BOOK
Freq: Once | Status: AC
  Filled 2021-03-19: qty 1

## 2021-03-19 MED ORDER — SODIUM BICARBONATE 650 MG PO TABS
650.0000 mg | ORAL_TABLET | Freq: Two times a day (BID) | ORAL | Status: DC
  Administered 2021-03-19 – 2021-03-20 (×4): 650 mg via ORAL
  Filled 2021-03-19 (×4): qty 1

## 2021-03-19 MED ORDER — FENTANYL CITRATE (PF) 100 MCG/2ML IJ SOLN
INTRAMUSCULAR | Status: AC | PRN
  Administered 2021-03-19: 25 ug via INTRAVENOUS
  Administered 2021-03-19: 50 ug via INTRAVENOUS

## 2021-03-19 MED ORDER — MIDAZOLAM HCL 2 MG/2ML IJ SOLN
INTRAMUSCULAR | Status: AC
  Filled 2021-03-19: qty 6

## 2021-03-19 MED ORDER — SODIUM CHLORIDE 0.9% FLUSH
5.0000 mL | Freq: Three times a day (TID) | INTRAVENOUS | Status: DC
  Administered 2021-03-19 – 2021-04-08 (×55): 5 mL

## 2021-03-19 MED ORDER — FENTANYL CITRATE (PF) 100 MCG/2ML IJ SOLN
INTRAMUSCULAR | Status: AC
  Filled 2021-03-19: qty 4

## 2021-03-19 MED ORDER — MIDAZOLAM HCL 2 MG/2ML IJ SOLN
INTRAMUSCULAR | Status: AC | PRN
Start: 2021-03-19 — End: 2021-03-19
  Administered 2021-03-19: 1 mg via INTRAVENOUS
  Administered 2021-03-19: 0.5 mg via INTRAVENOUS

## 2021-03-19 NOTE — Progress Notes (Addendum)
Inpatient Diabetes Program Recommendations  AACE/ADA: New Consensus Statement on Inpatient Glycemic Control   Target Ranges:  Prepandial:   less than 140 mg/dL      Peak postprandial:   less than 180 mg/dL (1-2 hours)      Critically ill patients:  140 - 180 mg/dL  Results for Tracey Bryant, Tracey Bryant (MRN [id number]) as of 03/19/2021 07:19  Ref. Range 03/19/2021 05:04  Glucose Latest Ref Range: 70 - 99 mg/dL 184 (H)   Results for Tracey Bryant, Tracey Bryant (MRN [id number]) as of 03/19/2021 07:19  Ref. Range 03/18/2021 07:40 03/18/2021 11:43 03/18/2021 16:15 03/18/2021 20:47  Glucose-Capillary Latest Ref Range: 70 - 99 mg/dL 283 (H) 297 (H) 256 (H) 250 (H)   Review of Glycemic Control  Diabetes history: DM2 Outpatient Diabetes medications: Glipizide XL 5 mg QAM, Metformin 1000 mg BID Current orders for Inpatient glycemic control:Lantus 12 units daily, Lantus 25 units QPM, Novolog 0-15 units TID with meals, Novolog 0-5 units QHS  Inpatient Diabetes Program Recommendations:    Insulin: Please consider increasing morning Lantus to 16 units daily.  Addendum 03/19/21-Called and spoke with patient's son about DM and insulin. Patient's son Tracey Bryant reports that he mother will likely have to move in with him once ready for discharge from the hospital. Informed Tracey Bryant that I spoke with patient yesterday and she reports that Tracey Bryant will need to help her with insulin and she did not feel well yesterday and did not want to review insulin pen with me. Informed Tracey Bryant that patient has an insulin starter kit in her purse (she wanted it placed in her purse yesterday) and he was encouraged to read through the information. Tracey Bryant states that he plans to visit with his mother this evening after work. Tracey Bryant states that he has never given insulin to anyone and has no prior knowledge of insulin. Discussed that patient should have Basaglar and Novolog insulin pens at home (prescribed in [name] and patient reported she has them in the refrigerator at  home). Discussed how to store insulin and review Basaglar and Novolog insulin. Explained that if patient is discharged on insulin, her discharge paperwork will have specific instructions about dosages and frequency. Reviewed verbally how to use an insulin pen. Informed Tracey Bryant, I would also send him a link for a video on how to use an insulin pen as well. Discussed glucose and A1C goals, hypoglycemia along with treatment, and encouraged him to reach out to patient's PCP if she has any issues with hypoglycemia. Informed Tracey Bryant a Living Well with DM book would also be ordered for him to learn more about DM management. Encouraged Tracey Bryant to administer insulin to his mother when he is at the hospital visiting with her. Tracey Bryant appreciative of information, verbalized understanding of information discussed and states he has no questions at this time. Will ask Nursing to work with Tracey Bryant on insulin administration and allow him to administer insulin to his mother if she is scheduled to get any while he is visiting with her.  Thanks, Barnie Alderman, RN, MSN, CDE Diabetes Coordinator Inpatient Diabetes Program [phone] (Team Pager from 8am to 5pm)

## 2021-03-19 NOTE — Progress Notes (Signed)
Patient transferred from [facility]. Patient A&Ox4, stable, and able to voice needs. Placed on telemetry and awaiting drain placement. Denies pain at this time. Will continue to monitor and treat per MD orders.

## 2021-03-19 NOTE — Progress Notes (Signed)
PROGRESS NOTE  Tracey Bryant VOH:[id number] DOB: 1951-08-24 DOA: 03/16/2021 PCP: Coolidge Breeze, FNP  Brief History:  70 year old female with a history of diabetes mellitus type 2, cardiomyopathy, hypertension, stroke presenting with 2-day history of nausea, vomiting, and generalized weakness.  In addition, the patient developed abdominal pain on the morning of 03/16/2021.  She presented with epistaxis to the ED.  She was treated with Afrin and cotton balls with improvement and discharged home on 03/16/2021.  The patient had denied any fevers, chills, chest pain, shortness breath, diarrhea, hematochezia, melena. In the emergency department, the patient was afebrile and hemodynamically stable.  Serum glucose was noted to be 416 oh with anion gap of 17.  The patient was started on intravenous insulin and IV fluids.  She was also noted to be anemic.  She is a Restaurant manager, fast food.  WBC was noted to be 21.9.  She was started on Zosyn.  CT of the abdomen and pelvis showed small perihepatic ascites, cholelithiasis with gallbladder wall thickening without biliary ductal dilatation.  RUQ [place] confirmed acute calculous cholecystitis.  General surgery was consulted and did not feel patient was a good surgical candidate.  IR was consulted for cholecystotomy tube placement.  Assessment/Plan: DKA type II -patient started on IV insulin with q 1 hour CBG check and q 4 hour BMPs -pt started on aggressive fluid resuscitation -Electrolytes were monitored and repleted -transitioned to [place] insulin once anion gap closed -diet was advanced once anion gap closed -HbA1C--9.1  Severe Sepsis -present on admission -had hypotension, leukocytosis, elevated lactate, tachycardia -due to UTI and cholecystitis -follow urine and blood cultures -continue Zosyn -continue judicious fluids -BP, tachycardia improved -rapid rise in WBC-->pt received dose Granix on 03/17/21 (unclear reason)  Acute cholecystitis -3/23 CT  abd as above -03/18/21 abd US-GB wall thickened with impacted stones in GB neck; no hydronephrosis -concerned about cholecystitis -general surgery consulted>>poor surgical candidate>>IR consult for cholecystotomy tube -03/18/21--discussed with IR PA-C, Casey-->wants patient transferred to [facility] permanently due to patient's severity of illness -Continue empiric Zosyn -PCT 1.08  Leukemoid reaction -pt received Granix on 03/17/21 at 1700 -WBC up from 21.9>>55.8>>50.6 -follow 3/22 blood and urine cultures -blood culture neg to date  UTI--GNR -follow urine culture -continue zosyn  AKI -baseline creatinine ~1.6-1.7 -due to sepsis/hypotension/hemodynamic changes -serum creatinine peaked 3.40 -03/18/21 abd US--GB wall thickened with impacted stones in GB neck; no hydronephrosis -obtain UA--> 50WBC, 11-20 RBC, 300+protein -Hold furosemide temporarily -start po bicarb  Acute blood loss anemia -Secondary to epistaxis -apixaban on hold, last dose >3 days  -FOBT negative x2 -03/18/21 FOBT Positive AFTER patient started on oral iron -Patient is Jehovah's Witness -Iron saturation 14, ferritin 99 -Continue ferrous sulfate  Chronic systolic and diastolic CHF -6/94/85 Echo EF 25-30%, G2DD, global HK -start carvedilol if remains hemodynamically stable -not a candidate for cath currently due to AKI -clinically euvolemic -restart lasix if remains hemodynamically stable  Transaminasemia -due to sepsis/hypotension and cholecystitis -has plateaued and now trending down -LFTs in am  Uncontrolled diabetes mellitus type 2 with hyperglycemia -Continue Lantus 25 units at bedtime -continue Lantus 12 units in the morning -Increase to moderate sliding scale -03/16/2021 hemoglobin A1c 9.1 -Discontinue glipizide -pt states she frequently misses her insulin doses at home  History of stroke -Apixaban on hold secondary to acute blood loss anemia  Hyperlipidemia -Continue  Zetia  Hyperkalemia -lokelma x 1 -improved     Status is: Inpatient  Remains inpatient  appropriate because:Hemodynamically unstable, Unsafe d/c plan and IV treatments appropriate due to intensity of illness or inability to take PO   Dispo: The patient is from: Home  Anticipated d/c is to: Home  Patient currently is not medically stable to d/c.              Difficult to place patient No        Family Communication:  son updated 3/24  Consultants:  General surgery  Code Status:  FULL   DVT Prophylaxis:  SCDs   Procedures: As Listed in Progress Note Above  Antibiotics: Zosyn 3/22>>      Subjective: Patient complains of nausea, and upper abd pain.  Denies f/c, cp, sob, vomiting, diarrhea, hematochezia  Objective: Vitals:   03/18/21 2200 03/18/21 2300 03/19/21 0020 03/19/21 0542  BP: (!) 127/59 (!) 117/46    Pulse: 73 70    Resp: (!) 0 13    Temp:   (!) 97.4 F (36.3 C) 97.9 F (36.6 C)  TempSrc:   Oral Oral  SpO2: 100% 99%    Weight:    90 kg  Height:        Intake/Output Summary (Last 24 hours) at 03/19/2021 0640 Last data filed at 03/19/2021 0543 Gross per 24 hour  Intake 3093.06 ml  Output 850 ml  Net 2243.06 ml   Weight change: 8 kg Exam:   General:  Pt is alert, follows commands appropriately, not in acute distress  HEENT: No icterus, No thrush, No neck mass, [place]/AT  Cardiovascular: RRR, S1/S2, no rubs, no gallops  Respiratory: fine bibasilar rales. No wheeze  Abdomen: Soft/+BS, epigastric and RUQ tender, non distended, no guarding  Extremities: No edema, No lymphangitis, No petechiae, No rashes, no synovitis   Data Reviewed: I have personally reviewed following labs and imaging studies Basic Metabolic Panel: Recent Labs  Lab 03/16/21 2003 03/17/21 0221 03/17/21 0456 03/17/21 1003 03/18/21 0630  NA 136 135 137 136 131*  K 4.7 4.8 4.7 4.3 5.2*  CL 111 110 110 109 104  CO2 14*  17* 17* 17* 13*  GLUCOSE 219* 204* 184* 171* 323*  BUN 62* 62* 64* 66* 78*  CREATININE 2.33* 2.40* 2.41* 2.50* 3.40*  CALCIUM 7.7* 7.7* 7.8* 7.7* 7.4*   Liver Function Tests: Recent Labs  Lab 03/16/21 1300 03/17/21 1003 03/18/21 0924  AST 45* 248* 804*  ALT 29 222* 794*  ALKPHOS 37* 34* 40  BILITOT 1.0 0.5 0.6  PROT 6.3* 5.8* 5.5*  ALBUMIN 2.9* 2.7* 2.6*   Recent Labs  Lab 03/16/21 1300  LIPASE 32   No results for input(s): AMMONIA in the last 168 hours. Coagulation Profile: Recent Labs  Lab 03/17/21 0456 03/18/21 0630  INR 1.2 1.6*   CBC: Recent Labs  Lab 03/15/21 1316 03/16/21 1300 03/17/21 0456 03/18/21 0630  WBC 13.8* 16.3* 21.9* 55.8*  NEUTROABS 10.7* 13.7*  --   --   HGB 8.5* 7.1* 6.5* 6.8*  HCT 26.8* 22.8* 20.9* 22.2*  MCV 92.4 94.6 95.0 97.4  PLT 323 314 293 292   Cardiac Enzymes: No results for input(s): CKTOTAL, CKMB, CKMBINDEX, TROPONINI in the last 168 hours. BNP: Invalid input(s): POCBNP CBG: Recent Labs  Lab 03/17/21 2047 03/18/21 0740 03/18/21 1143 03/18/21 1615 03/18/21 2047  GLUCAP 365* 283* 297* 256* 250*   HbA1C: Recent Labs    03/16/21 1521  HGBA1C 9.1*   Urine analysis:    Component Value Date/Time   COLORURINE AMBER (A) 03/17/2021 1208   APPEARANCEUR CLOUDY (  A) 03/17/2021 1208   LABSPEC 1.015 03/17/2021 1208   PHURINE 5.0 03/17/2021 1208   GLUCOSEU 50 (A) 03/17/2021 1208   HGBUR SMALL (A) 03/17/2021 1208   BILIRUBINUR NEGATIVE 03/17/2021 1208   KETONESUR NEGATIVE 03/17/2021 1208   PROTEINUR >=300 (A) 03/17/2021 1208   UROBILINOGEN 0.2 01/17/2013 1910   NITRITE NEGATIVE 03/17/2021 1208   LEUKOCYTESUR LARGE (A) 03/17/2021 1208   Sepsis Labs: @LABRCNTIP (procalcitonin:4,lacticidven:4) ) Recent Results (from the past 240 hour(s))  SARS CORONAVIRUS 2 (Noelani Harbach 6-24 HRS) Nasopharyngeal Nasopharyngeal Swab     Status: None   Collection Time: 03/16/21  1:32 PM   Specimen: Nasopharyngeal Swab  Result Value Ref Range  Status   SARS Coronavirus 2 NEGATIVE NEGATIVE Final    Comment: (NOTE) SARS-CoV-2 target nucleic acids are NOT DETECTED.  The SARS-CoV-2 RNA is generally detectable in upper and lower respiratory specimens during the acute phase of infection. Negative results do not preclude SARS-CoV-2 infection, do not rule out co-infections with other pathogens, and should not be used as the sole basis for treatment or other patient management decisions. Negative results must be combined with clinical observations, patient history, and epidemiological information. The expected result is Negative.  Fact Sheet for Patients: SugarRoll.be  Fact Sheet for Healthcare Providers: https://www.woods-mathews.com/  This test is not yet approved or cleared by the [place] FDA and  has been authorized for detection and/or diagnosis of SARS-CoV-2 by FDA under an Emergency Use Authorization (EUA). This EUA will remain  in effect (meaning this test can be used) for the duration of the COVID-19 declaration under Se ction 564(b)(1) of the Act, 21 U.S.C. section 360bbb-3(b)(1), unless the authorization is terminated or revoked sooner.  Performed at [facility], [place] [address], [place], [place] [address]   MRSA PCR Screening     Status: None   Collection Time: 03/16/21  5:06 PM   Specimen: Nasal Mucosa; Nasopharyngeal  Result Value Ref Range Status   MRSA by PCR NEGATIVE NEGATIVE Final    Comment:        The GeneXpert MRSA Assay (FDA approved for NASAL specimens only), is one component of a comprehensive MRSA colonization surveillance program. It is not intended to diagnose MRSA infection nor to guide or monitor treatment for MRSA infections. Performed at [facility], [address] [place], [place] [address]   Culture, Urine     Status: Abnormal (Preliminary result)   Collection Time: 03/17/21 12:08 PM   Specimen: Urine, Clean Catch  Result Value  Ref Range Status   Specimen Description   Final    URINE, CLEAN CATCH Performed at [facility], [address], [place], [place] [address]    Special Requests   Final    NONE Performed at [facility], [address] [place], [place] [address]    Culture (A)  Final    >=100,000 COLONIES/mL GRAM NEGATIVE RODS SUSCEPTIBILITIES TO FOLLOW Performed at [facility], [place] [address] [place], [place] [address]    Report Status PENDING  Incomplete  Culture, blood (routine x 2)     Status: None (Preliminary result)   Collection Time: 03/17/21  1:18 PM   Specimen: BLOOD  Result Value Ref Range Status   Specimen Description BLOOD LEFT ANTECUBITAL  Final   Special Requests   Final    Blood Culture adequate volume BOTTLES DRAWN AEROBIC AND ANAEROBIC   Culture   Final    NO GROWTH < 24 HOURS Performed at [facility], [employer]  [address] [place], [place] [address]    Report Status PENDING  Incomplete  Culture, blood (routine x 2)     Status: None (Preliminary result)   Collection Time: 03/17/21  1:19 PM   Specimen: BLOOD LEFT HAND  Result Value Ref Range Status   Specimen Description BLOOD LEFT HAND  Final   Special Requests   Final    Blood Culture results may not be optimal due to an inadequate volume of blood received in culture bottles BOTTLES DRAWN AEROBIC ONLY   Culture   Final    NO GROWTH < 24 HOURS Performed at [facility], [address] [place], [place] [address]    Report Status PENDING  Incomplete     Scheduled Meds: . Chlorhexidine Gluconate Cloth  6 each Topical Daily  . ezetimibe  10 mg Oral Daily  . ferrous sulfate  325 mg Oral BID WC  . insulin aspart  0-15 Units Subcutaneous TID WC  . insulin aspart  0-5 Units Subcutaneous QHS  . insulin glargine  12 Units Subcutaneous Daily  . insulin glargine  25 Units Subcutaneous QPM  . metoCLOPramide (REGLAN) injection  10 mg Intravenous Q8H  . sodium chloride flush  3 mL Intravenous Q12H   Continuous Infusions: .  sodium chloride 100 mL/hr at 03/18/21 2140  . piperacillin-tazobactam (ZOSYN)  IV 3.375 g (03/18/21 2139)    Procedures/Studies: CT ABDOMEN PELVIS WO CONTRAST  Result Date: 03/17/2021 CLINICAL DATA:  Nonlocalized abdominal pain. Increased lactic acid. Lethargy. Confusion. EXAM: CT ABDOMEN AND PELVIS WITHOUT CONTRAST TECHNIQUE: Multidetector CT imaging of the abdomen and pelvis was performed following the standard protocol without IV contrast. COMPARISON:  01/17/2013 FINDINGS: Lower chest: Left lower lobe 9 mm pulmonary nodule on 25/4 is similar in 2014 and considered benign. Bibasilar subsegmental atelectasis. Mild cardiomegaly with pacer. Distal right coronary artery calcification. Small bilateral pleural effusions. Hepatobiliary: Small volume perihepatic ascites. Subtle hypoattenuation involving the anterior portion of segment 4 B, including on 28/2. On the order of 5.0 cm. 2.0 cm gallstone with gallbladder wall thickening up to 9 mm. No biliary duct dilatation. Pancreas: Fatty replacement involving the pancreatic head and uncinate process. Spleen: Normal in size, without focal abnormality. Adrenals/Urinary Tract: Normal adrenal glands. Mild renal cortical thinning bilaterally. Interpolar right renal exophytic 7.4 cm fluid density lesion with calcification in its wall. Trace air within the nondependent bladder including on 79/2. No ureteric or bladder calculi. Stomach/Bowel: Proximal gastric underdistention. Colonic stool burden suggests constipation. Normal terminal ileum and appendix. Normal small bowel. Vascular/Lymphatic: Advanced aortic and branch vessel atherosclerosis. Retroaortic left renal vein. No abdominopelvic adenopathy. Reproductive: Normal uterus and adnexa. Other: Small volume abdominopelvic fluid including adjacent the liver and spleen. Musculoskeletal: Mild anasarca. Remote eighth posterior left rib fracture. IMPRESSION: 1. Cholelithiasis with gallbladder wall thickening. Correlate with  right upper quadrant symptoms and possibly ultrasound to exclude acute cholecystitis. 2.  Possible constipation. 3. Bilateral small pleural effusions and abdominopelvic ascites with anasarca. Question fluid overload. 4. Coronary artery atherosclerosis. Aortic Atherosclerosis (ICD10-I70.0). 5. Segment 4 B hypoattenuation could represent prominent focal steatosis but is indeterminate. Consider nonemergent, outpatient pre and post contrast abdominal MRI. 6. Trace air within the bladder.  Correlate with instrumentation. Electronically Signed   By: Abigail Miyamoto M.D.   On: 03/17/2021 16:38   US Abdomen Complete  Result Date: 03/18/2021 CLINICAL DATA:  Cholelithiasis, acute renal failure EXAM: ABDOMEN ULTRASOUND COMPLETE COMPARISON:  CT 03/17/2021 FINDINGS: Gallbladder: The gallbladder is distended. Several shadowing gallstones are seen impacted within the gallbladder  neck and there is layering sludge within the gallbladder. The gallbladder wall is markedly thickened measuring up to 14 mm in diameter and mild pericholecystic fluid is identified. The sonographic Percell Miller sign is reportedly positive. Common bile duct: Diameter: 4-5 mm in proximal diameter. Liver: No focal lesion identified. Within normal limits in parenchymal echogenicity. Portal vein is patent on color Doppler imaging with normal direction of blood flow towards the liver. IVC: No abnormality visualized. Pancreas: Visualized portion unremarkable. Spleen: Size and appearance within normal limits. Right Kidney: Length: 10.9 cm. Echogenicity within normal limits. No mass or hydronephrosis visualized. 8.1 cm simple cortical cyst identified within the upper pole. Left Kidney: Length: 11.0 cm. Echogenicity within normal limits. No mass or hydronephrosis visualized. Abdominal aorta: No aneurysm visualized, though the distal aorta is obscured by overlying bowel gas. Other findings: Small right pleural effusion is present. Mild perihepatic and perisplenic ascites  is noted. IMPRESSION: Acute calculus cholecystitis. Mild ascites.  Small right pleural effusion. 8.1 cm right upper pole simple cortical cyst. This is best characterized as a Bosniak class 1 cyst and further follow-up is not required. Electronically Signed   By: Fidela Salisbury MD   On: 03/18/2021 12:51   US Abdomen Limited  Result Date: 03/18/2021 CLINICAL DATA:  Right upper quadrant abdominal pain EXAM: ULTRASOUND ABDOMEN LIMITED RIGHT UPPER QUADRANT COMPARISON:  CT 03/17/2021 FINDINGS: Gallbladder: The gallbladder is distended, there is marked gallbladder wall thickening, and mild pericholecystic fluid identified. The gallbladder contains layering sludge and stones. Stones are seen within the gallbladder neck, however, decubitus positioning was not performed to assess for impaction. The sonographic Percell Miller sign is reportedly positive. Common bile duct: Diameter: 4 mm in proximal diameter Liver: No focal lesion identified. Within normal limits in parenchymal echogenicity. Portal vein is patent on color Doppler imaging with normal direction of blood flow towards the liver. Other: Mild perihepatic ascites is present. Small right pleural effusion is noted. 8.9 cm simple cortical cyst is noted within the upper pole of the right kidney. IMPRESSION: Acute calculus cholecystitis. Mild perihepatic ascites and small right pleural effusion. Electronically Signed   By: Fidela Salisbury MD   On: 03/18/2021 12:46   ECHOCARDIOGRAM COMPLETE  Result Date: 03/18/2021    ECHOCARDIOGRAM REPORT   Patient Name:   PHYLLICIA DUDEK Date of Exam: 03/18/2021 Medical Rec #:  [id number]       Height:       66.0 in Accession #:    [id number]      Weight:       180.8 lb Date of Birth:  1951/01/26       BSA:          1.916 m Patient Age:    85 years        BP:           126/51 mmHg Patient Gender: F               HR:           71 bpm. Exam Location:  [facility] Procedure: 2D Echo and Intracardiac Opacification Agent Indications:    CHF-Acute  Systolic G38.75                 SBE I33.9  History:        Patient has no prior history of Echocardiogram examinations.                 Stroke; Risk Factors:Non-Smoker, Diabetes and Hypertension.  Rheumatoid Arthritis.  Sonographer:    Leavy Cella RDCS (AE) Referring Phys: [phone] SEYED A SHAHMEHDI IMPRESSIONS  1. Left ventricular ejection fraction, by estimation, is 25 to 30%. The left ventricle has normal function. The left ventricle demonstrates global hypokinesis. There is mild left ventricular hypertrophy. Left ventricular diastolic parameters are consistent with Grade II diastolic dysfunction (pseudonormalization). Elevated left atrial pressure.  2. RV poorly visualized, grossly appears enlarged with decreased systolic function. . Right ventricular systolic function was not well visualized. The right ventricular size is not well visualized.  3. Left atrial size was severely dilated.  4. The mitral valve is normal in structure. No evidence of mitral valve regurgitation. No evidence of mitral stenosis.  5. The aortic valve is tricuspid. There is mild calcification of the aortic valve. There is mild thickening of the aortic valve. Aortic valve regurgitation is not visualized. No aortic stenosis is present.  6. Mild pulmonary HTN, PASP is 36 mmHg.  7. The inferior vena cava is normal in size with greater than 50% respiratory variability, suggesting right atrial pressure of 3 mmHg. FINDINGS  Left Ventricle: Left ventricular ejection fraction, by estimation, is 25 to 30%. The left ventricle has normal function. The left ventricle demonstrates global hypokinesis. The left ventricular internal cavity size was normal in size. There is mild left  ventricular hypertrophy. Left ventricular diastolic parameters are consistent with Grade II diastolic dysfunction (pseudonormalization). Elevated left atrial pressure. Right Ventricle: RV poorly visualized, grossly appears enlarged with decreased systolic  function. The right ventricular size is not well visualized. Right vetricular wall thickness was not well visualized. Right ventricular systolic function was not well  visualized. Left Atrium: Left atrial size was severely dilated. Right Atrium: Right atrial size was not well visualized. Pericardium: There is no evidence of pericardial effusion. Mitral Valve: The mitral valve is normal in structure. There is mild thickening of the mitral valve leaflet(s). There is mild calcification of the mitral valve leaflet(s). Mild mitral annular calcification. No evidence of mitral valve regurgitation. No evidence of mitral valve stenosis. Tricuspid Valve: The tricuspid valve is normal in structure. Tricuspid valve regurgitation is mild . No evidence of tricuspid stenosis. Aortic Valve: The aortic valve is tricuspid. There is mild calcification of the aortic valve. There is mild thickening of the aortic valve. There is mild aortic valve annular calcification. Aortic valve regurgitation is not visualized. No aortic stenosis  is present. Aortic valve mean gradient measures 2.3 mmHg. Aortic valve peak gradient measures 4.6 mmHg. Aortic valve area, by VTI measures 1.70 cm. Pulmonic Valve: The pulmonic valve was not well visualized. Pulmonic valve regurgitation is not visualized. No evidence of pulmonic stenosis. Aorta: The aortic root is normal in size and structure. Pulmonary Artery: Mild pulmonary HTN, PASP is 36 mmHg. Venous: The inferior vena cava is normal in size with greater than 50% respiratory variability, suggesting right atrial pressure of 3 mmHg. IAS/Shunts: No atrial level shunt detected by color flow Doppler.  LEFT VENTRICLE PLAX 2D LVIDd:         4.08 cm  Diastology LVIDs:         3.43 cm  LV e' medial:    4.79 cm/s LV PW:         1.34 cm  LV E/e' medial:  24.4 LV IVS:        1.08 cm  LV e' lateral:   7.83 cm/s LVOT diam:     1.80 cm  LV E/e' lateral: 14.9 LV SV:  33 LV SV Index:   17 LVOT Area:     2.54 cm   RIGHT VENTRICLE RV S prime:     9.90 cm/s TAPSE (M-mode): 1.2 cm LEFT ATRIUM             Index       RIGHT ATRIUM           Index LA diam:        4.60 cm 2.40 cm/m  RA Area:     12.90 cm LA Vol (A2C):   61.2 ml 31.95 ml/m RA Volume:   28.60 ml  14.93 ml/m LA Vol (A4C):   92.0 ml 48.02 ml/m LA Biplane Vol: 78.2 ml 40.82 ml/m  AORTIC VALVE AV Area (Vmax):    1.82 cm AV Area (Vmean):   1.50 cm AV Area (VTI):     1.70 cm AV Vmax:           107.72 cm/s AV Vmean:          71.452 cm/s AV VTI:            0.191 m AV Peak Grad:      4.6 mmHg AV Mean Grad:      2.3 mmHg LVOT Vmax:         77.12 cm/s LVOT Vmean:        42.238 cm/s LVOT VTI:          0.128 m LVOT/AV VTI ratio: 0.67  AORTA Ao Root diam: 2.50 cm MITRAL VALVE                TRICUSPID VALVE MV Area (PHT): 4.77 cm     TR Peak grad:   33.2 mmHg MV Decel Time: 159 msec     TR Vmax:        288.00 cm/s MV E velocity: 117.00 cm/s MV A velocity: 28.40 cm/s   SHUNTS MV E/A ratio:  4.12         Systemic VTI:  0.13 m                             Systemic Diam: 1.80 cm Carlyle Dolly MD Electronically signed by Carlyle Dolly MD Signature Date/Time: 03/18/2021/2:24:49 PM    Final     Orson Eva, DO  [facility]  If 7PM-7AM, please contact night-coverage [email or web address] Password [facility] 03/19/2021, 6:40 AM   LOS: 3 days

## 2021-03-19 NOTE — Progress Notes (Signed)
Pharmacist Heart Failure Core Measure Documentation  Assessment: Tracey Bryant has an EF documented as 25-30% on 03/18/21 by ECHO.  Rationale: Heart failure patients with left ventricular systolic dysfunction (LVSD) and an EF < 40% should be prescribed an angiotensin converting enzyme inhibitor (ACEI) or angiotensin receptor blocker (ARB) at discharge unless a contraindication is documented in the medical record.  This patient is not currently on an ACEI or ARB for HF.  This note is being placed in the record in order to provide documentation that a contraindication to the use of these agents is present for this encounter.  ACE Inhibitor or Angiotensin Receptor Blocker is contraindicated (specify all that apply)  []   ACEI allergy AND ARB allergy []   Angioedema []   Moderate or severe aortic stenosis []   Hyperkalemia []   Hypotension []   Renal artery stenosis [x]   Worsening renal function, preexisting renal disease or dysfunction

## 2021-03-19 NOTE — Consult Note (Signed)
Chief Complaint: Patient was seen in consultation today for percutaneous cholecystostomy drain placement Chief Complaint  Patient presents with  . Emesis   at the request of Dr Mickeal Needy   Supervising Physician: Corrie Mckusick  Patient Status: AP IP  History of Present Illness: Tracey Bryant is a 70 y.o. female   Worsening RUQ pain x 2-3 days. +N/V Leukocytosis in ED Acute anemia of blood loss (was seen and treated for epistaxis 03/15/21) Hg 6.9 Acute renal injury Cr 3.5; DM  CT 03/17/21:  IMPRESSION: 1. Cholelithiasis with gallbladder wall thickening. Correlate with right upper quadrant symptoms and possibly ultrasound to exclude acute cholecystitis. 2.  Possible constipation. 3. Bilateral small pleural effusions and abdominopelvic ascites with anasarca. Question fluid overload. 4. Coronary artery atherosclerosis. Aortic Atherosclerosis (ICD10-I70.0). 5. Segment 4 B hypoattenuation could represent prominent focal steatosis but is indeterminate. Consider nonemergent, outpatient pre and post contrast abdominal MRI. 6. Trace air within the bladder.  Correlate with instrumentation.  [place] yesterday:  IMPRESSION: Acute calculus cholecystitis. Mild ascites.  Small right pleural effusion. 8.1 cm right upper pole simple cortical cyst. This is best characterized as a Bosniak class 1 cyst and further follow-up is not Required.  Surgical consultation Dr Arnoldo Morale recommending percutaneous cholecystostomy -- NOT a surgical candidate at this point Images were reviewed with IR Rad Approved for procedure Pt is to TRANSFER to [facility] (procedure this am-- and be admitted to [facility])  Past Medical History:  Diagnosis Date  . Arthritis   . Cardiomyopathy ([place])   . Diabetes mellitus without complication ([place])   . Hypertension   . Stroke [facility])     Past Surgical History:  Procedure Laterality Date  . breast tumor    . PACEMAKER IMPLANT    . TONSILLECTOMY       Allergies: Patient has no known allergies.  Medications: Prior to Admission medications   Medication Sig Start Date End Date Taking? Authorizing Provider  apixaban (ELIQUIS) 5 MG TABS tablet Take 5 mg by mouth 2 (two) times daily.   Yes [provider]  ezetimibe (ZETIA) 10 MG tablet Take 10 mg by mouth daily.   Yes [provider]  furosemide (LASIX) 20 MG tablet Take 20 mg by mouth.   Yes [provider]  glipiZIDE (GLUCOTROL XL) 5 MG 24 hr tablet Take 5 mg by mouth daily with breakfast.   Yes [provider]  insulin glargine (LANTUS) 100 UNIT/ML injection Inject 50 Units into the skin at bedtime.   Yes [provider]  insulin NPH-regular (NOVOLIN 70/30) (70-30) 100 UNIT/ML injection Inject 10 Units into the skin 3 (three) times daily after meals.   Yes [provider]     Family History  Problem Relation Age of Onset  . Cancer Mother   . Diabetes Mother   . Heart disease Father     Social History   Socioeconomic History  . Marital status: Divorced    Spouse name: Not on file  . Number of children: Not on file  . Years of education: Not on file  . Highest education level: Not on file  Occupational History  . Not on file  Tobacco Use  . Smoking status: Never Smoker  . Smokeless tobacco: Never Used  Vaping Use  . Vaping Use: Never used  Substance and Sexual Activity  . Alcohol use: No  . Drug use: Never  . Sexual activity: Not on file  Other Topics Concern  . Not on file  Social  History Narrative  . Not on file   Social Determinants of Health   Financial Resource Strain: Not on file  Food Insecurity: Not on file  Transportation Needs: Not on file  Physical Activity: Not on file  Stress: Not on file  Social Connections: Not on file    Review of Systems: A 12 point ROS discussed and pertinent positives are indicated in the HPI above.  All other systems are negative.  Review of Systems   Constitutional: Positive for activity change, appetite change and fatigue. Negative for fever.  Respiratory: Negative for cough and shortness of breath.   Cardiovascular: Negative for chest pain.  Gastrointestinal: Positive for abdominal pain, blood in stool, nausea and vomiting.  Neurological: Positive for weakness.  Psychiatric/Behavioral: Negative for behavioral problems and confusion.    Vital Signs: BP (!) 114/41   Pulse 68   Temp (!) 97.5 F (36.4 C) (Oral)   Resp 15   Ht 5\' 6"  (1.676 m)   Wt 198 lb 6.6 oz (90 kg)   SpO2 97%   BMI 32.02 kg/m   Physical Exam Vitals reviewed.  HENT:     Mouth/Throat:     Mouth: Mucous membranes are moist.  Cardiovascular:     Rate and Rhythm: Normal rate and regular rhythm.     Heart sounds: Normal heart sounds.  Pulmonary:     Effort: Pulmonary effort is normal.     Breath sounds: Normal breath sounds.  Abdominal:     Palpations: Abdomen is soft.     Tenderness: There is abdominal tenderness.  Musculoskeletal:        General: Normal range of motion.  Skin:    General: Skin is warm.  Neurological:     Mental Status: She is alert and oriented to person, place, and time.     Comments: Pt is able to answer all questions appropriately Knows name/date/place Reason for procedure  Psychiatric:        Behavior: Behavior normal.     Imaging: CT ABDOMEN PELVIS WO CONTRAST  Result Date: 03/17/2021 CLINICAL DATA:  Nonlocalized abdominal pain. Increased lactic acid. Lethargy. Confusion. EXAM: CT ABDOMEN AND PELVIS WITHOUT CONTRAST TECHNIQUE: Multidetector CT imaging of the abdomen and pelvis was performed following the standard protocol without IV contrast. COMPARISON:  01/17/2013 FINDINGS: Lower chest: Left lower lobe 9 mm pulmonary nodule on 25/4 is similar in 2014 and considered benign. Bibasilar subsegmental atelectasis. Mild cardiomegaly with pacer. Distal right coronary artery calcification. Small bilateral pleural effusions.  Hepatobiliary: Small volume perihepatic ascites. Subtle hypoattenuation involving the anterior portion of segment 4 B, including on 28/2. On the order of 5.0 cm. 2.0 cm gallstone with gallbladder wall thickening up to 9 mm. No biliary duct dilatation. Pancreas: Fatty replacement involving the pancreatic head and uncinate process. Spleen: Normal in size, without focal abnormality. Adrenals/Urinary Tract: Normal adrenal glands. Mild renal cortical thinning bilaterally. Interpolar right renal exophytic 7.4 cm fluid density lesion with calcification in its wall. Trace air within the nondependent bladder including on 79/2. No ureteric or bladder calculi. Stomach/Bowel: Proximal gastric underdistention. Colonic stool burden suggests constipation. Normal terminal ileum and appendix. Normal small bowel. Vascular/Lymphatic: Advanced aortic and branch vessel atherosclerosis. Retroaortic left renal vein. No abdominopelvic adenopathy. Reproductive: Normal uterus and adnexa. Other: Small volume abdominopelvic fluid including adjacent the liver and spleen. Musculoskeletal: Mild anasarca. Remote eighth posterior left rib fracture. IMPRESSION: 1. Cholelithiasis with gallbladder wall thickening. Correlate with right upper quadrant symptoms and possibly ultrasound to exclude acute cholecystitis. 2.  Possible constipation. 3. Bilateral small pleural effusions and abdominopelvic ascites with anasarca. Question fluid overload. 4. Coronary artery atherosclerosis. Aortic Atherosclerosis (ICD10-I70.0). 5. Segment 4 B hypoattenuation could represent prominent focal steatosis but is indeterminate. Consider nonemergent, outpatient pre and post contrast abdominal MRI. 6. Trace air within the bladder.  Correlate with instrumentation. Electronically Signed   By: Abigail Miyamoto M.D.   On: 03/17/2021 16:38   US Abdomen Complete  Result Date: 03/18/2021 CLINICAL DATA:  Cholelithiasis, acute renal failure EXAM: ABDOMEN ULTRASOUND COMPLETE  COMPARISON:  CT 03/17/2021 FINDINGS: Gallbladder: The gallbladder is distended. Several shadowing gallstones are seen impacted within the gallbladder neck and there is layering sludge within the gallbladder. The gallbladder wall is markedly thickened measuring up to 14 mm in diameter and mild pericholecystic fluid is identified. The sonographic Percell Miller sign is reportedly positive. Common bile duct: Diameter: 4-5 mm in proximal diameter. Liver: No focal lesion identified. Within normal limits in parenchymal echogenicity. Portal vein is patent on color Doppler imaging with normal direction of blood flow towards the liver. IVC: No abnormality visualized. Pancreas: Visualized portion unremarkable. Spleen: Size and appearance within normal limits. Right Kidney: Length: 10.9 cm. Echogenicity within normal limits. No mass or hydronephrosis visualized. 8.1 cm simple cortical cyst identified within the upper pole. Left Kidney: Length: 11.0 cm. Echogenicity within normal limits. No mass or hydronephrosis visualized. Abdominal aorta: No aneurysm visualized, though the distal aorta is obscured by overlying bowel gas. Other findings: Small right pleural effusion is present. Mild perihepatic and perisplenic ascites is noted. IMPRESSION: Acute calculus cholecystitis. Mild ascites.  Small right pleural effusion. 8.1 cm right upper pole simple cortical cyst. This is best characterized as a Bosniak class 1 cyst and further follow-up is not required. Electronically Signed   By: Fidela Salisbury MD   On: 03/18/2021 12:51   US Abdomen Limited  Result Date: 03/18/2021 CLINICAL DATA:  Right upper quadrant abdominal pain EXAM: ULTRASOUND ABDOMEN LIMITED RIGHT UPPER QUADRANT COMPARISON:  CT 03/17/2021 FINDINGS: Gallbladder: The gallbladder is distended, there is marked gallbladder wall thickening, and mild pericholecystic fluid identified. The gallbladder contains layering sludge and stones. Stones are seen within the gallbladder neck,  however, decubitus positioning was not performed to assess for impaction. The sonographic Percell Miller sign is reportedly positive. Common bile duct: Diameter: 4 mm in proximal diameter Liver: No focal lesion identified. Within normal limits in parenchymal echogenicity. Portal vein is patent on color Doppler imaging with normal direction of blood flow towards the liver. Other: Mild perihepatic ascites is present. Small right pleural effusion is noted. 8.9 cm simple cortical cyst is noted within the upper pole of the right kidney. IMPRESSION: Acute calculus cholecystitis. Mild perihepatic ascites and small right pleural effusion. Electronically Signed   By: Fidela Salisbury MD   On: 03/18/2021 12:46   ECHOCARDIOGRAM COMPLETE  Result Date: 03/18/2021    ECHOCARDIOGRAM REPORT   Patient Name:   Tracey Bryant Date of Exam: 03/18/2021 Medical Rec #:  [id number]       Height:       66.0 in Accession #:    [id number]      Weight:       180.8 lb Date of Birth:  [birth year]       BSA:          1.916 m Patient Age:    39 years        BP:           126/51 mmHg Patient Gender: F  HR:           71 bpm. Exam Location:  [facility] Procedure: 2D Echo and Intracardiac Opacification Agent Indications:    CHF-Acute Systolic E75.17                 SBE I33.9  History:        Patient has no prior history of Echocardiogram examinations.                 Stroke; Risk Factors:Non-Smoker, Diabetes and Hypertension.                 Rheumatoid Arthritis.  Sonographer:    Leavy Cella RDCS (AE) Referring Phys: [phone] SEYED A SHAHMEHDI IMPRESSIONS  1. Left ventricular ejection fraction, by estimation, is 25 to 30%. The left ventricle has normal function. The left ventricle demonstrates global hypokinesis. There is mild left ventricular hypertrophy. Left ventricular diastolic parameters are consistent with Grade II diastolic dysfunction (pseudonormalization). Elevated left atrial pressure.  2. RV poorly visualized, grossly appears  enlarged with decreased systolic function. . Right ventricular systolic function was not well visualized. The right ventricular size is not well visualized.  3. Left atrial size was severely dilated.  4. The mitral valve is normal in structure. No evidence of mitral valve regurgitation. No evidence of mitral stenosis.  5. The aortic valve is tricuspid. There is mild calcification of the aortic valve. There is mild thickening of the aortic valve. Aortic valve regurgitation is not visualized. No aortic stenosis is present.  6. Mild pulmonary HTN, PASP is 36 mmHg.  7. The inferior vena cava is normal in size with greater than 50% respiratory variability, suggesting right atrial pressure of 3 mmHg. FINDINGS  Left Ventricle: Left ventricular ejection fraction, by estimation, is 25 to 30%. The left ventricle has normal function. The left ventricle demonstrates global hypokinesis. The left ventricular internal cavity size was normal in size. There is mild left  ventricular hypertrophy. Left ventricular diastolic parameters are consistent with Grade II diastolic dysfunction (pseudonormalization). Elevated left atrial pressure. Right Ventricle: RV poorly visualized, grossly appears enlarged with decreased systolic function. The right ventricular size is not well visualized. Right vetricular wall thickness was not well visualized. Right ventricular systolic function was not well  visualized. Left Atrium: Left atrial size was severely dilated. Right Atrium: Right atrial size was not well visualized. Pericardium: There is no evidence of pericardial effusion. Mitral Valve: The mitral valve is normal in structure. There is mild thickening of the mitral valve leaflet(s). There is mild calcification of the mitral valve leaflet(s). Mild mitral annular calcification. No evidence of mitral valve regurgitation. No evidence of mitral valve stenosis. Tricuspid Valve: The tricuspid valve is normal in structure. Tricuspid valve  regurgitation is mild . No evidence of tricuspid stenosis. Aortic Valve: The aortic valve is tricuspid. There is mild calcification of the aortic valve. There is mild thickening of the aortic valve. There is mild aortic valve annular calcification. Aortic valve regurgitation is not visualized. No aortic stenosis  is present. Aortic valve mean gradient measures 2.3 mmHg. Aortic valve peak gradient measures 4.6 mmHg. Aortic valve area, by VTI measures 1.70 cm. Pulmonic Valve: The pulmonic valve was not well visualized. Pulmonic valve regurgitation is not visualized. No evidence of pulmonic stenosis. Aorta: The aortic root is normal in size and structure. Pulmonary Artery: Mild pulmonary HTN, PASP is 36 mmHg. Venous: The inferior vena cava is normal in size with greater than 50% respiratory variability, suggesting right atrial pressure  of 3 mmHg. IAS/Shunts: No atrial level shunt detected by color flow Doppler.  LEFT VENTRICLE PLAX 2D LVIDd:         4.08 cm  Diastology LVIDs:         3.43 cm  LV e' medial:    4.79 cm/s LV PW:         1.34 cm  LV E/e' medial:  24.4 LV IVS:        1.08 cm  LV e' lateral:   7.83 cm/s LVOT diam:     1.80 cm  LV E/e' lateral: 14.9 LV SV:         33 LV SV Index:   17 LVOT Area:     2.54 cm  RIGHT VENTRICLE RV S prime:     9.90 cm/s TAPSE (M-mode): 1.2 cm LEFT ATRIUM             Index       RIGHT ATRIUM           Index LA diam:        4.60 cm 2.40 cm/m  RA Area:     12.90 cm LA Vol (A2C):   61.2 ml 31.95 ml/m RA Volume:   28.60 ml  14.93 ml/m LA Vol (A4C):   92.0 ml 48.02 ml/m LA Biplane Vol: 78.2 ml 40.82 ml/m  AORTIC VALVE AV Area (Vmax):    1.82 cm AV Area (Vmean):   1.50 cm AV Area (VTI):     1.70 cm AV Vmax:           107.72 cm/s AV Vmean:          71.452 cm/s AV VTI:            0.191 m AV Peak Grad:      4.6 mmHg AV Mean Grad:      2.3 mmHg LVOT Vmax:         77.12 cm/s LVOT Vmean:        42.238 cm/s LVOT VTI:          0.128 m LVOT/AV VTI ratio: 0.67  AORTA Ao Root diam: 2.50  cm MITRAL VALVE                TRICUSPID VALVE MV Area (PHT): 4.77 cm     TR Peak grad:   33.2 mmHg MV Decel Time: 159 msec     TR Vmax:        288.00 cm/s MV E velocity: 117.00 cm/s MV A velocity: 28.40 cm/s   SHUNTS MV E/A ratio:  4.12         Systemic VTI:  0.13 m                             Systemic Diam: 1.80 cm Carlyle Dolly MD Electronically signed by Carlyle Dolly MD Signature Date/Time: 03/18/2021/2:24:49 PM    Final     Labs:  CBC: Recent Labs    03/16/21 1300 03/17/21 0456 03/18/21 0630 03/19/21 0504  WBC 16.3* 21.9* 55.8* 50.6*  HGB 7.1* 6.5* 6.8* 6.9*  HCT 22.8* 20.9* 22.2* 21.9*  PLT 314 293 292 298    COAGS: Recent Labs    03/17/21 0456 03/18/21 0630 03/19/21 0504  INR 1.2 1.6* 1.4*    BMP: Recent Labs    03/17/21 0456 03/17/21 1003 03/18/21 0630 03/19/21 0504  NA 137 136 131* 135  K 4.7 4.3 5.2* 4.1  CL 110  109 104 106  CO2 17* 17* 13* 14*  GLUCOSE 184* 171* 323* 184*  BUN 64* 66* 78* 85*  CALCIUM 7.8* 7.7* 7.4* 7.1*  CREATININE 2.41* 2.50* 3.40* 3.59*  GFRNONAA 21* 20* 14* 13*    LIVER FUNCTION TESTS: Recent Labs    03/16/21 1300 03/17/21 1003 03/18/21 0924 03/19/21 0504  BILITOT 1.0 0.5 0.6 0.7  AST 45* 248* 804* 396*  ALT 29 222* 794* 792*  ALKPHOS 37* 34* 40 44  PROT 6.3* 5.8* 5.5* 5.5*  ALBUMIN 2.9* 2.7* 2.6* 2.5*    TUMOR MARKERS: No results for input(s): AFPTM, CEA, CA199, CHROMGRNA in the last 8760 hours.  Assessment and Plan:  Acute calculus cholecystitis Leukocytosis Anemia; RUQ pain Not a surgical candidate per Dr Arnoldo Morale Scheduled for percutaneous cholecystostomy drain placement To be at [facility] by 9 am--- plan is to admit to [facility] after procedure Risks and benefits discussed with the patient including, but not limited to bleeding, infection, gallbladder perforation, bile leak, sepsis or even death.  All of the patient's questions were answered, patient is agreeable to proceed. Consent signed and in  chart.  Thank you for this interesting consult.  I greatly enjoyed meeting Tracey Bryant and look forward to participating in their care.  A copy of this report was sent to the requesting provider on this date.  Electronically Signed: Lavonia Drafts, PA-C 03/19/2021, 8:00 AM   I spent a total of 40 Minutes    in face to face in clinical consultation, greater than 50% of which was counseling/coordinating care for percutaneous cholecystostomy drain

## 2021-03-19 NOTE — Progress Notes (Signed)
Patient's son, [name], instructed how to administer insulin injections and voices understanding of required steps. He preferred to observe this time and stated he would administer it himself tomorrow with guidance. Living well with Diabetes book given to son who stated he would review it and ask questions.

## 2021-03-19 NOTE — Progress Notes (Signed)
Patient to be transferred to [facility] after IR placement of cholecystostomy tube.  We will follow peripherally as needed should patient be transferred back to [facility].

## 2021-03-19 NOTE — Procedures (Signed)
Interventional Radiology Procedure Note  Procedure: CT guided cholecystostomy tube placement  Findings: Please refer to procedural dictation for full description.  10.2 Fr transhepatic cholecystostomy tube placed.  Dark, bilious aspirate, sample sent for culture.  Tube placed to bulb suction.  Complications: None immediate  Estimated Blood Loss: < 5 mL  Recommendations: Keep to bulb suction for now.  OK to transfer to bag drainage once leukocytosis normalizes. IR will follow. IR will arrange for 2 month outpatient check and change.  Can evaluate for candidacy of percutaneous gallstone removal at that time given long term candidacy for definitive operative intervention is low.   Ruthann Cancer, MD

## 2021-03-20 ENCOUNTER — Inpatient Hospital Stay (HOSPITAL_COMMUNITY): Payer: Medicare HMO

## 2021-03-20 DIAGNOSIS — N390 Urinary tract infection, site not specified: Secondary | ICD-10-CM | POA: Diagnosis not present

## 2021-03-20 DIAGNOSIS — N179 Acute kidney failure, unspecified: Secondary | ICD-10-CM | POA: Diagnosis not present

## 2021-03-20 DIAGNOSIS — K810 Acute cholecystitis: Secondary | ICD-10-CM | POA: Diagnosis not present

## 2021-03-20 DIAGNOSIS — E0810 Diabetes mellitus due to underlying condition with ketoacidosis without coma: Secondary | ICD-10-CM | POA: Diagnosis not present

## 2021-03-20 LAB — CBC
HCT: 19 % — ABNORMAL LOW (ref 36.0–46.0)
Hemoglobin: 6.1 g/dL — CL (ref 12.0–15.0)
MCH: 29.8 pg (ref 26.0–34.0)
MCHC: 32.1 g/dL (ref 30.0–36.0)
MCV: 92.7 fL (ref 80.0–100.0)
Platelets: 270 10*3/uL (ref 150–400)
RBC: 2.05 MIL/uL — ABNORMAL LOW (ref 3.87–5.11)
RDW: 17.2 % — ABNORMAL HIGH (ref 11.5–15.5)
WBC: 37 10*3/uL — ABNORMAL HIGH (ref 4.0–10.5)
nRBC: 1.1 % — ABNORMAL HIGH (ref 0.0–0.2)

## 2021-03-20 LAB — BASIC METABOLIC PANEL
Anion gap: 10 (ref 5–15)
BUN: 90 mg/dL — ABNORMAL HIGH (ref 8–23)
CO2: 15 mmol/L — ABNORMAL LOW (ref 22–32)
Calcium: 6.6 mg/dL — ABNORMAL LOW (ref 8.9–10.3)
Chloride: 106 mmol/L (ref 98–111)
Creatinine, Ser: 3.96 mg/dL — ABNORMAL HIGH (ref 0.44–1.00)
GFR, Estimated: 12 mL/min — ABNORMAL LOW (ref >60)
Glucose, Bld: 146 mg/dL — ABNORMAL HIGH (ref 70–99)
Potassium: 4 mmol/L (ref 3.5–5.1)
Sodium: 131 mmol/L — ABNORMAL LOW (ref 135–145)

## 2021-03-20 LAB — GLUCOSE, CAPILLARY
Glucose-Capillary: 122 mg/dL — ABNORMAL HIGH (ref 70–99)
Glucose-Capillary: 178 mg/dL — ABNORMAL HIGH (ref 70–99)
Glucose-Capillary: 184 mg/dL — ABNORMAL HIGH (ref 70–99)
Glucose-Capillary: 194 mg/dL — ABNORMAL HIGH (ref 70–99)

## 2021-03-20 LAB — HEPATIC FUNCTION PANEL
ALT: 723 U/L — ABNORMAL HIGH (ref 0–44)
AST: 317 U/L — ABNORMAL HIGH (ref 15–41)
Albumin: 2.2 g/dL — ABNORMAL LOW (ref 3.5–5.0)
Alkaline Phosphatase: 50 U/L (ref 38–126)
Bilirubin, Direct: 0.2 mg/dL (ref 0.0–0.2)
Indirect Bilirubin: 0.4 mg/dL (ref 0.3–0.9)
Total Bilirubin: 0.6 mg/dL (ref 0.3–1.2)
Total Protein: 5.2 g/dL — ABNORMAL LOW (ref 6.5–8.1)

## 2021-03-20 LAB — PROTIME-INR
INR: 1.6 — ABNORMAL HIGH (ref 0.8–1.2)
Prothrombin Time: 18.9 seconds — ABNORMAL HIGH (ref 11.4–15.2)

## 2021-03-20 IMAGING — DX DG CHEST 1V PORT
1 series · 1 of 1 positions shown · non-contrast
Comparison: [DATE].

CLINICAL DATA: Hypoxia.

EXAM:
PORTABLE CHEST 1 VIEW

[chest ap]
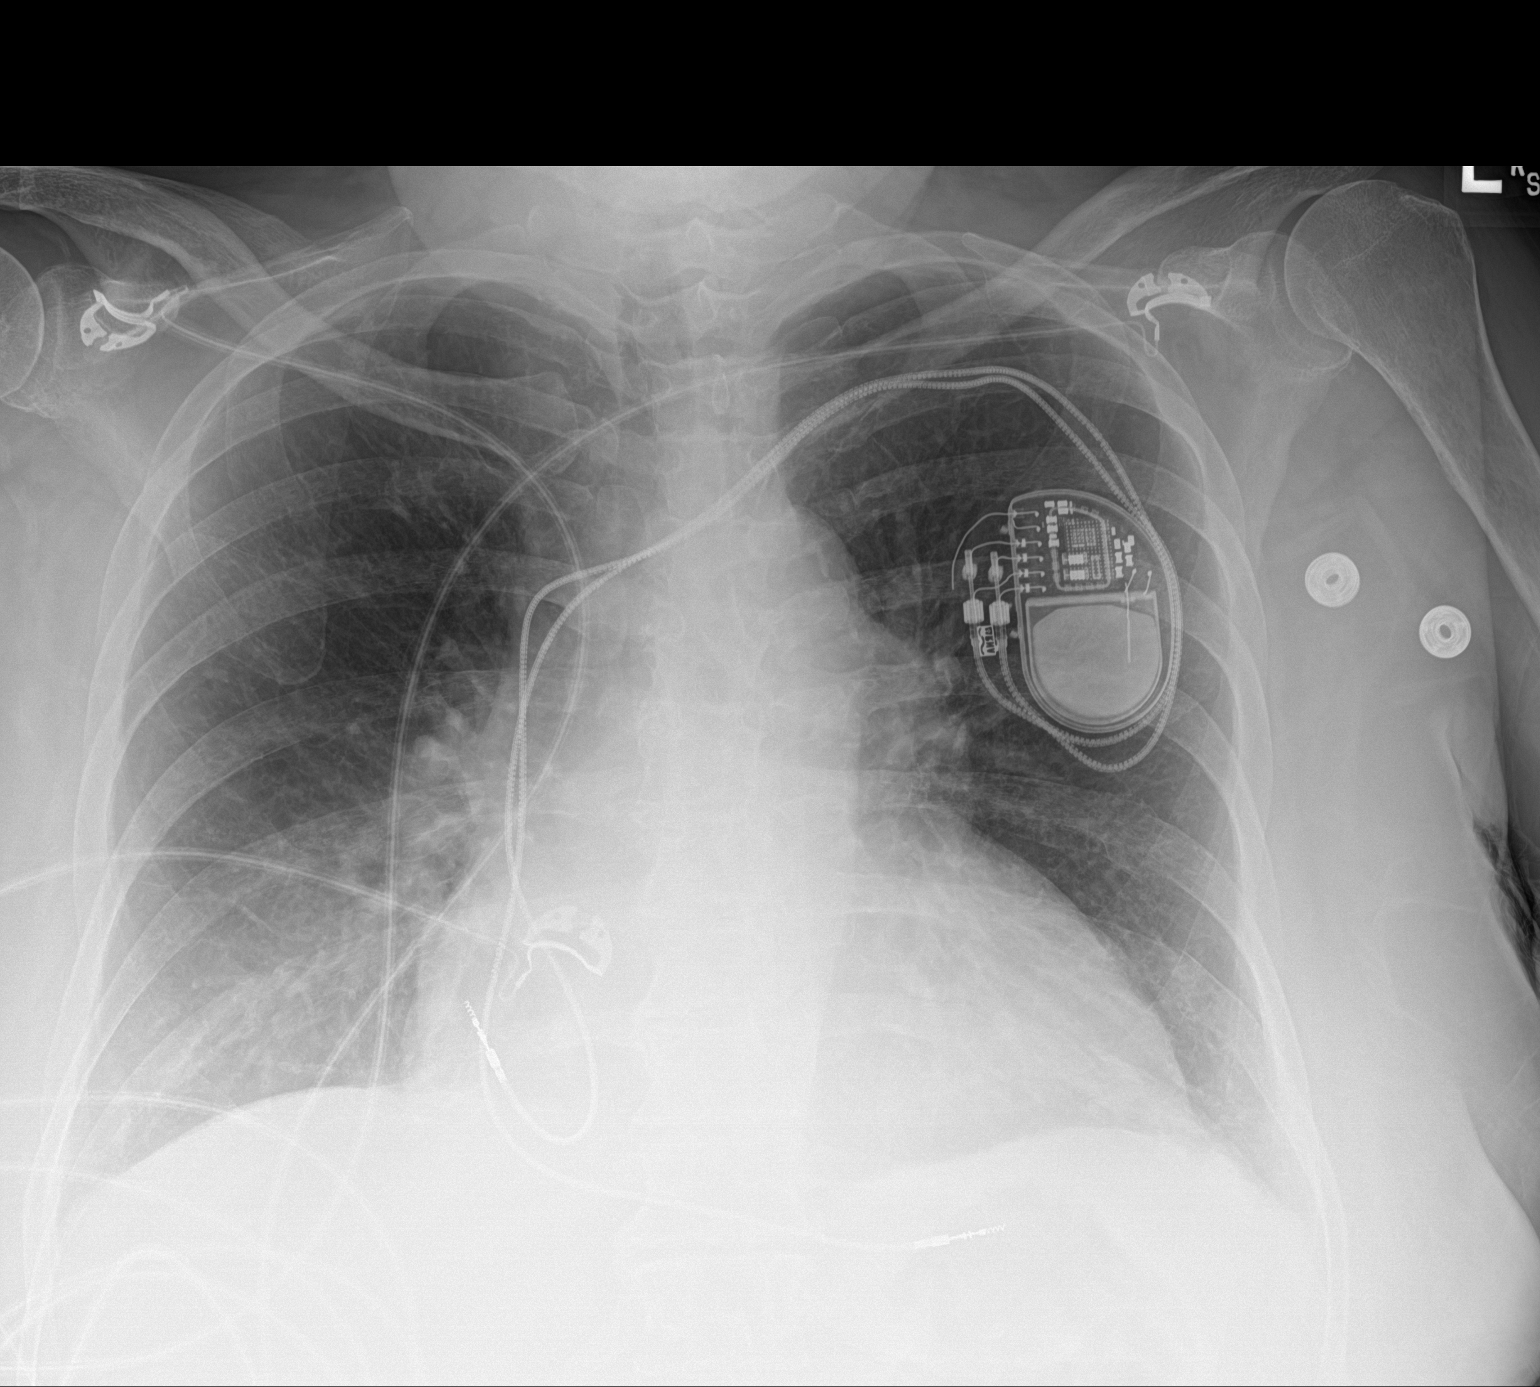

[1 of 1 positions shown; findings below may reference images not displayed]

FINDINGS: Stable cardiomediastinal silhouette. Mild central pulmonary vascular
congestion is noted. No pneumothorax or pleural effusion is noted.
No consolidative process is noted. Left-sided pacemaker is in
grossly good position. Bony thorax is unremarkable.
IMPRESSION: Mild central pulmonary vascular congestion.

## 2021-03-20 MED ORDER — DARBEPOETIN ALFA 60 MCG/0.3ML IJ SOSY
60.0000 ug | PREFILLED_SYRINGE | Freq: Once | INTRAMUSCULAR | Status: AC
  Administered 2021-03-20: 60 ug via SUBCUTANEOUS
  Filled 2021-03-20: qty 0.3

## 2021-03-20 MED ORDER — HYDROMORPHONE HCL 1 MG/ML IJ SOLN
0.5000 mg | INTRAMUSCULAR | Status: DC | PRN
  Administered 2021-03-20 – 2021-04-02 (×3): 0.5 mg via INTRAVENOUS
  Filled 2021-03-20 (×3): qty 0.5

## 2021-03-20 MED ORDER — INSULIN GLARGINE 100 UNIT/ML ~~LOC~~ SOLN
20.0000 [IU] | Freq: Every day | SUBCUTANEOUS | Status: DC
  Administered 2021-03-21 – 2021-04-05 (×16): 20 [IU] via SUBCUTANEOUS
  Filled 2021-03-20 (×17): qty 0.2

## 2021-03-20 MED ORDER — SODIUM CHLORIDE 0.9 % IV SOLN
510.0000 mg | INTRAVENOUS | Status: AC
  Administered 2021-03-20 – 2021-03-27 (×2): 510 mg via INTRAVENOUS
  Filled 2021-03-20 (×2): qty 17

## 2021-03-20 MED ORDER — ADULT MULTIVITAMIN W/MINERALS CH
1.0000 | ORAL_TABLET | Freq: Every day | ORAL | Status: DC
  Administered 2021-03-20 – 2021-04-08 (×20): 1 via ORAL
  Filled 2021-03-20 (×20): qty 1

## 2021-03-20 NOTE — Progress Notes (Signed)
Referring Physician(s): Dr. Mickeal Needy  Supervising Physician: Aletta Edouard  Patient Status:  [facility] [place] - In-pt  Chief Complaint: Acute calculus cholecystitis s/p percutaneous cholecystostomy 03/19/21  Subjective: Patient in bed, states she feels very weak and tired. She denies any drain-related pain or discomfort.   Allergies: Patient has no known allergies.  Medications: Prior to Admission medications   Medication Sig Start Date End Date Taking? Authorizing Provider  apixaban (ELIQUIS) 5 MG TABS tablet Take 5 mg by mouth 2 (two) times daily.   Yes [provider]  ezetimibe (ZETIA) 10 MG tablet Take 10 mg by mouth daily.   Yes [provider]  furosemide (LASIX) 20 MG tablet Take 20 mg by mouth.   Yes [provider]  glipiZIDE (GLUCOTROL XL) 5 MG 24 hr tablet Take 5 mg by mouth daily with breakfast.   Yes [provider]  insulin glargine (LANTUS) 100 UNIT/ML injection Inject 50 Units into the skin at bedtime.   Yes [provider]  insulin NPH-regular (NOVOLIN 70/30) (70-30) 100 UNIT/ML injection Inject 10 Units into the skin 3 (three) times daily after meals.   Yes [provider]     Vital Signs: BP 117/63 (BP Location: Left Arm)   Pulse 65   Temp 98 F (36.7 C) (Axillary)   Resp 18   Ht 5\' 6"  (1.676 m)   Wt 212 lb 11.9 oz (96.5 kg)   SpO2 100%   BMI 34.34 kg/m   Physical Exam Constitutional:      General: She is not in acute distress. Pulmonary:     Effort: Pulmonary effort is normal.  Abdominal:     Palpations: Abdomen is soft.     Tenderness: There is no abdominal tenderness.     Comments: RUQ drain to suction. Site is clean and dry. Approximately 15 ml of dark brown/green fluid in bulb. Drain easily flushed and aspirated.   Skin:    General: Skin is warm and dry.  Neurological:     Mental Status: She is alert and oriented to person, place, and time.     Imaging: CT ABDOMEN PELVIS WO  CONTRAST  Result Date: 03/17/2021 CLINICAL DATA:  Nonlocalized abdominal pain. Increased lactic acid. Lethargy. Confusion. EXAM: CT ABDOMEN AND PELVIS WITHOUT CONTRAST TECHNIQUE: Multidetector CT imaging of the abdomen and pelvis was performed following the standard protocol without IV contrast. COMPARISON:  01/17/2013 FINDINGS: Lower chest: Left lower lobe 9 mm pulmonary nodule on 25/4 is similar in 2014 and considered benign. Bibasilar subsegmental atelectasis. Mild cardiomegaly with pacer. Distal right coronary artery calcification. Small bilateral pleural effusions. Hepatobiliary: Small volume perihepatic ascites. Subtle hypoattenuation involving the anterior portion of segment 4 B, including on 28/2. On the order of 5.0 cm. 2.0 cm gallstone with gallbladder wall thickening up to 9 mm. No biliary duct dilatation. Pancreas: Fatty replacement involving the pancreatic head and uncinate process. Spleen: Normal in size, without focal abnormality. Adrenals/Urinary Tract: Normal adrenal glands. Mild renal cortical thinning bilaterally. Interpolar right renal exophytic 7.4 cm fluid density lesion with calcification in its wall. Trace air within the nondependent bladder including on 79/2. No ureteric or bladder calculi. Stomach/Bowel: Proximal gastric underdistention. Colonic stool burden suggests constipation. Normal terminal ileum and appendix. Normal small bowel. Vascular/Lymphatic: Advanced aortic and branch vessel atherosclerosis. Retroaortic left renal vein. No abdominopelvic adenopathy. Reproductive: Normal uterus and adnexa. Other: Small volume abdominopelvic fluid including adjacent the liver and spleen. Musculoskeletal: Mild anasarca. Remote eighth posterior left rib fracture. IMPRESSION: 1.  Cholelithiasis with gallbladder wall thickening. Correlate with right upper quadrant symptoms and possibly ultrasound to exclude acute cholecystitis. 2.  Possible constipation. 3. Bilateral small pleural effusions and  abdominopelvic ascites with anasarca. Question fluid overload. 4. Coronary artery atherosclerosis. Aortic Atherosclerosis (ICD10-I70.0). 5. Segment 4 B hypoattenuation could represent prominent focal steatosis but is indeterminate. Consider nonemergent, outpatient pre and post contrast abdominal MRI. 6. Trace air within the bladder.  Correlate with instrumentation. Electronically Signed   By: Abigail Miyamoto M.D.   On: 03/17/2021 16:38   US Abdomen Complete  Result Date: 03/18/2021 CLINICAL DATA:  Cholelithiasis, acute renal failure EXAM: ABDOMEN ULTRASOUND COMPLETE COMPARISON:  CT 03/17/2021 FINDINGS: Gallbladder: The gallbladder is distended. Several shadowing gallstones are seen impacted within the gallbladder neck and there is layering sludge within the gallbladder. The gallbladder wall is markedly thickened measuring up to 14 mm in diameter and mild pericholecystic fluid is identified. The sonographic Percell Miller sign is reportedly positive. Common bile duct: Diameter: 4-5 mm in proximal diameter. Liver: No focal lesion identified. Within normal limits in parenchymal echogenicity. Portal vein is patent on color Doppler imaging with normal direction of blood flow towards the liver. IVC: No abnormality visualized. Pancreas: Visualized portion unremarkable. Spleen: Size and appearance within normal limits. Right Kidney: Length: 10.9 cm. Echogenicity within normal limits. No mass or hydronephrosis visualized. 8.1 cm simple cortical cyst identified within the upper pole. Left Kidney: Length: 11.0 cm. Echogenicity within normal limits. No mass or hydronephrosis visualized. Abdominal aorta: No aneurysm visualized, though the distal aorta is obscured by overlying bowel gas. Other findings: Small right pleural effusion is present. Mild perihepatic and perisplenic ascites is noted. IMPRESSION: Acute calculus cholecystitis. Mild ascites.  Small right pleural effusion. 8.1 cm right upper pole simple cortical cyst. This is best  characterized as a Bosniak class 1 cyst and further follow-up is not required. Electronically Signed   By: Fidela Salisbury MD   On: 03/18/2021 12:51   US Abdomen Limited  Result Date: 03/18/2021 CLINICAL DATA:  Right upper quadrant abdominal pain EXAM: ULTRASOUND ABDOMEN LIMITED RIGHT UPPER QUADRANT COMPARISON:  CT 03/17/2021 FINDINGS: Gallbladder: The gallbladder is distended, there is marked gallbladder wall thickening, and mild pericholecystic fluid identified. The gallbladder contains layering sludge and stones. Stones are seen within the gallbladder neck, however, decubitus positioning was not performed to assess for impaction. The sonographic Percell Miller sign is reportedly positive. Common bile duct: Diameter: 4 mm in proximal diameter Liver: No focal lesion identified. Within normal limits in parenchymal echogenicity. Portal vein is patent on color Doppler imaging with normal direction of blood flow towards the liver. Other: Mild perihepatic ascites is present. Small right pleural effusion is noted. 8.9 cm simple cortical cyst is noted within the upper pole of the right kidney. IMPRESSION: Acute calculus cholecystitis. Mild perihepatic ascites and small right pleural effusion. Electronically Signed   By: Fidela Salisbury MD   On: 03/18/2021 12:46   DG Chest Port 1 View  Result Date: 03/20/2021 CLINICAL DATA:  Hypoxia. EXAM: PORTABLE CHEST 1 VIEW COMPARISON:  January 17, 2013. FINDINGS: Stable cardiomediastinal silhouette. Mild central pulmonary vascular congestion is noted. No pneumothorax or pleural effusion is noted. No consolidative process is noted. 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 Bony thorax is unremarkable. IMPRESSION: Mild central pulmonary vascular congestion. Electronically Signed   By: Marijo Conception M.D.   On: 03/20/2021 08:55   ECHOCARDIOGRAM COMPLETE  Result Date: 03/18/2021    ECHOCARDIOGRAM REPORT   Patient Name:   Tracey Bryant  Moon Date of Exam: 03/18/2021 Medical Rec #:   [id number]       Height:       66.0 in Accession #:    [id number]      Weight:       180.8 lb Date of Birth:  06/23/1951       BSA:          1.916 m Patient Age:    70 years        BP:           126/51 mmHg Patient Gender: F               HR:           71 bpm. Exam Location:  [facility] Procedure: 2D Echo and Intracardiac Opacification Agent Indications:    CHF-Acute Systolic L46.50                 SBE I33.9  History:        Patient has no prior history of Echocardiogram examinations.                 Stroke; Risk Factors:Non-Smoker, Diabetes and Hypertension.                 Rheumatoid Arthritis.  Sonographer:    Leavy Cella RDCS (AE) Referring Phys: [phone] SEYED A SHAHMEHDI IMPRESSIONS  1. Left ventricular ejection fraction, by estimation, is 25 to 30%. The left ventricle has normal function. The left ventricle demonstrates global hypokinesis. There is mild left ventricular hypertrophy. Left ventricular diastolic parameters are consistent with Grade II diastolic dysfunction (pseudonormalization). Elevated left atrial pressure.  2. RV poorly visualized, grossly appears enlarged with decreased systolic function. . Right ventricular systolic function was not well visualized. The right ventricular size is not well visualized.  3. Left atrial size was severely dilated.  4. The mitral valve is normal in structure. No evidence of mitral valve regurgitation. No evidence of mitral stenosis.  5. The aortic valve is tricuspid. There is mild calcification of the aortic valve. There is mild thickening of the aortic valve. Aortic valve regurgitation is not visualized. No aortic stenosis is present.  6. Mild pulmonary HTN, PASP is 36 mmHg.  7. The inferior vena cava is normal in size with greater than 50% respiratory variability, suggesting right atrial pressure of 3 mmHg. FINDINGS  Left Ventricle: Left ventricular ejection fraction, by estimation, is 25 to 30%. The left ventricle has normal function. The left ventricle  demonstrates global hypokinesis. The left ventricular internal cavity size was normal in size. There is mild left  ventricular hypertrophy. Left ventricular diastolic parameters are consistent with Grade II diastolic dysfunction (pseudonormalization). Elevated left atrial pressure. Right Ventricle: RV poorly visualized, grossly appears enlarged with decreased systolic function. The right ventricular size is not well visualized. Right vetricular wall thickness was not well visualized. Right ventricular systolic function was not well  visualized. Left Atrium: Left atrial size was severely dilated. Right Atrium: Right atrial size was not well visualized. Pericardium: There is no evidence of pericardial effusion. Mitral Valve: The mitral valve is normal in structure. There is mild thickening of the mitral valve leaflet(s). There is mild calcification of the mitral valve leaflet(s). Mild mitral annular calcification. No evidence of mitral valve regurgitation. No evidence of mitral valve stenosis. Tricuspid Valve: The tricuspid valve is normal in structure. Tricuspid valve regurgitation is mild . No evidence of tricuspid stenosis. Aortic Valve: The aortic valve is tricuspid. There is mild  calcification of the aortic valve. There is mild thickening of the aortic valve. There is mild aortic valve annular calcification. Aortic valve regurgitation is not visualized. No aortic stenosis  is present. Aortic valve mean gradient measures 2.3 mmHg. Aortic valve peak gradient measures 4.6 mmHg. Aortic valve area, by VTI measures 1.70 cm. Pulmonic Valve: The pulmonic valve was not well visualized. Pulmonic valve regurgitation is not visualized. No evidence of pulmonic stenosis. Aorta: The aortic root is normal in size and structure. Pulmonary Artery: Mild pulmonary HTN, PASP is 36 mmHg. Venous: The inferior vena cava is normal in size with greater than 50% respiratory variability, suggesting right atrial pressure of 3 mmHg.  IAS/Shunts: No atrial level shunt detected by color flow Doppler.  LEFT VENTRICLE PLAX 2D LVIDd:         4.08 cm  Diastology LVIDs:         3.43 cm  LV e' medial:    4.79 cm/s LV PW:         1.34 cm  LV E/e' medial:  24.4 LV IVS:        1.08 cm  LV e' lateral:   7.83 cm/s LVOT diam:     1.80 cm  LV E/e' lateral: 14.9 LV SV:         33 LV SV Index:   17 LVOT Area:     2.54 cm  RIGHT VENTRICLE RV S prime:     9.90 cm/s TAPSE (M-mode): 1.2 cm LEFT ATRIUM             Index       RIGHT ATRIUM           Index LA diam:        4.60 cm 2.40 cm/m  RA Area:     12.90 cm LA Vol (A2C):   61.2 ml 31.95 ml/m RA Volume:   28.60 ml  14.93 ml/m LA Vol (A4C):   92.0 ml 48.02 ml/m LA Biplane Vol: 78.2 ml 40.82 ml/m  AORTIC VALVE AV Area (Vmax):    1.82 cm AV Area (Vmean):   1.50 cm AV Area (VTI):     1.70 cm AV Vmax:           107.72 cm/s AV Vmean:          71.452 cm/s AV VTI:            0.191 m AV Peak Grad:      4.6 mmHg AV Mean Grad:      2.3 mmHg LVOT Vmax:         77.12 cm/s LVOT Vmean:        42.238 cm/s LVOT VTI:          0.128 m LVOT/AV VTI ratio: 0.67  AORTA Ao Root diam: 2.50 cm MITRAL VALVE                TRICUSPID VALVE MV Area (PHT): 4.77 cm     TR Peak grad:   33.2 mmHg MV Decel Time: 159 msec     TR Vmax:        288.00 cm/s MV E velocity: 117.00 cm/s MV A velocity: 28.40 cm/s   SHUNTS MV E/A ratio:  4.12         Systemic VTI:  0.13 m                             Systemic Diam: 1.80 cm Roderic Palau  Branch MD Electronically signed by Carlyle Dolly MD Signature Date/Time: 03/18/2021/2:24:49 PM    Final    CT IMAGE GUIDED DRAINAGE BY PERCUTANEOUS CATHETER  Result Date: 03/19/2021 INDICATION: Acute cholecystitis EXAM: CT -GUIDED CHOLECYSTOSTOMY TUBE PLACEMENT COMPARISON:  03/17/2021 MEDICATIONS: The patient is currently admitted to the hospital and on intravenous antibiotics. Antibiotics were administered within an appropriate time frame prior to skin puncture. ANESTHESIA/SEDATION: Moderate (conscious) sedation was  employed during this procedure. A total of Versed 1.5 mg and Fentanyl 75 mcg was administered intravenously. Moderate Sedation Time: 20 minutes. The patient's level of consciousness and vital signs were monitored continuously by radiology nursing throughout the procedure under my direct supervision. CONTRAST:  None. FLUOROSCOPY TIME:  None. COMPLICATIONS: None immediate. PROCEDURE: Informed written consent was obtained from the patient after a discussion of the risks, benefits and alternatives to treatment. Questions regarding the procedure were encouraged and answered. A timeout was performed prior to the initiation of the procedure. The right upper abdominal quadrant was prepped and draped in the usual sterile fashion, and a sterile drape was applied covering the operative field. Maximum barrier sterile technique with sterile gowns and gloves were used for the procedure. A timeout was performed prior to the initiation of the procedure. Local anesthesia was provided with 1% lidocaine with epinephrine. Limited upper abdominal CT demonstrates a markedly dilated gallbladder. Utilizing a transhepatic approach, an 18 gauge trocar needle was advanced into the gallbladder under intermittent CT guidance. An Amplatz wire was inserted and coiled within the gallbladder. Appropriate position was confirmed with limited CT. Serial dilation was performed and ultimately a 10.2-French Cook multipurpose drainage tube was advanced into the gallbladder infundibulum, coiled and locked. Limited upper abdominal CT was then obtained to confirm adequate positioning. Bile was aspirated. The catheter was secured to the skin with suture, connected to a drainage bag and a dressing was placed. The patient tolerated the procedure well without immediate post procedural complication. IMPRESSION: Successful CT guided placement of a 10.2 French cholecystostomy tube. PLAN: Follow-up with cholecystostomy tube check and change in 2 months. Ruthann Cancer, MD [facility] Rad[facility] Electronically Signed   By: Ruthann Cancer MD   On: 03/19/2021 16:39    Labs:  CBC: Recent Labs    03/17/21 0456 03/18/21 0630 03/19/21 0504 03/20/21 0147  WBC 21.9* 55.8* 50.6* 37.0*  HGB 6.5* 6.8* 6.9* 6.1*  HCT 20.9* 22.2* 21.9* 19.0*  PLT 293 292 298 270    COAGS: Recent Labs    03/17/21 0456 03/18/21 0630 03/19/21 0504 03/20/21 0147  INR 1.2 1.6* 1.4* 1.6*    BMP: Recent Labs    03/17/21 1003 03/18/21 0630 03/19/21 0504 03/20/21 0147  NA 136 131* 135 131*  K 4.3 5.2* 4.1 4.0  CL 109 104 106 106  CO2 17* 13* 14* 15*  GLUCOSE 171* 323* 184* 146*  BUN 66* 78* 85* 90*  CALCIUM 7.7* 7.4* 7.1* 6.6*  CREATININE 2.50* 3.40* 3.59* 3.96*  GFRNONAA 20* 14* 13* 12*    LIVER FUNCTION TESTS: Recent Labs    03/17/21 1003 03/18/21 0924 03/19/21 0504 03/20/21 0147  BILITOT 0.5 0.6 0.7 0.6  AST 248* 804* 396* 317*  ALT 222* 794* 792* 723*  ALKPHOS 34* 40 44 50  PROT 5.8* 5.5* 5.5* 5.2*  ALBUMIN 2.7* 2.6* 2.5* 2.2*    Assessment and Plan:  Acute calculus cholecystitis s/p percutaneous cholecystostomy 03/19/21: Patient is afebrile with decreased WBC to 37. AST/ALT trending down. Per Epic the drain  output for the past 24 hours is 180 ml with another 15 ml in the bulb.   IR recommends to continue flushing the drain and documenting the output each shift. Change the dressing daily or as needed.   Per notes from Surgery, patient is not a candidate for cholecystectomy at this time. Will need outpatient IR follow up in approximately 2 months for drain evaluation/exchange and possible work up for percutaneous gallstone removal.   Other plans per primary teams. IR will continue to follow.  Electronically Signed: Soyla Dryer, AGACNP-BC [phone] 03/20/2021, 9:21 AM   I spent a total of 15 Minutes at the the patient's bedside AND on the patient's hospital floor or unit, greater than  50% of which was counseling/coordinating care for percutaneous cholecystostomy care

## 2021-03-20 NOTE — Progress Notes (Signed)
Ok to give Aranesp 60mcg SQ x1 per Dr. Waldron Labs.  Onnie Boer, PharmD, [place], AAHIVP, CPP Infectious Disease Pharmacist 03/20/2021 11:21 AM  '

## 2021-03-20 NOTE — Consult Note (Signed)
Nephrology Consult   Requesting provider: Elgergawy, Silver Huguenin, MD  Assessment/Recommendations:   AKI (worsening): Likely secondary to ATN from sepsis/hypotension and on-going anemia, there is a concern for possible cardiorenal however she is not significantly volume up on exam. Despite fluids, there has not been any improvement in her AKI. Baseline Cr around 1 -supportive treatment at this time. Would not give any further fluids as this will just worsen her symptoms from a heart failure perspective. There is a possibility that her kidney function will continue to worsen but I am hoping that her Cr will soon plateau now that her BP is better and her source of her infection has been taken care of -No renal obstruction on abdominal ultrasound 3/23 -repeat UA ordered, hyaline casts on 3/22 noted -no urine output charted, need strict I/O -Avoid nephrotoxic medications including NSAIDs and iodinated intravenous contrast exposure unless the latter is absolutely indicated.  Preferred narcotic agents for pain control are hydromorphone, fentanyl, and methadone. Morphine should not be used. Avoid Baclofen and avoid oral sodium phosphate and magnesium citrate based laxatives / bowel preps. Continue strict Input and Output monitoring. Will monitor the patient closely with you and intervene or adjust therapy as indicated by changes in clinical status/labs   Acute cholecystitis, status post cholecystostomy tube on 3/25 with IR.  She is a poor surgical candidate, antibiotics per primary  Severe sepsis secondary to UTI and cholecystitis -Antibiotics per primary, no need for further fluids at this point  Chronic systolic and diastolic CHF -EF 25 to 27% (03/18/2021) -Not a candidate for cath due to her AKI -Would recommend having a low threshold on restarting diuretics if needed  Acute blood loss anemia -Eliquis on hold.  She is a Sales promotion account executive Witness, treating conservatively. Hgb 6.1 today  Metabolic acidosis,  lactic acidosis -repeat lactate  Hypertension: -Hold home antihypertensive medications if she is on any.  Would also recommend stopping her PRN's if she is on any  Hypernatremia, mild. Suspecting this is multifactorial: pain, hypervolemia, and AKI. Monitor for now. If lower then will work her up with urine and osm studies.  Diabetes Mellitus Type 2 with Hyperglycemia -mgmt per primary  Transaminitis -secondary to choleycystitis? Mgmt, workup per primary    [facility] 03/20/2021 1:16 PM   _____________________________________________________________________________________   History of Present Illness: Tracey Bryant is a 70 y.o. female with a past medical history of DM2, cardiomyopathy, hypertension, proteinuria, history of seropositive RA, history of stroke who presents to [facility] from [facility] with acute cholecystitis needing a cholecystostomy tube.  She originally presented to [facility] with nausea/vomiting/weakness and abdominal pain.  She actually had epistaxis prior to this presentation and was treated with Afrin and cotton balls and then was discharged on 3/21.  She was found to have a serum glucose of 416 in the ER this time around with an anion gap of 17.  She was started on IV insulin and fluids for DKA type II.  She was found to have acute calculus cholecystitis on CT scan but is not a good surgical candidate therefore was transferred here for drain placement. She has been having intermittent hypotension with systolics as low as 25D. She is also found to have an EF of 25 to 30% on echo.  Regards to her acute blood loss anemia from epistaxis, she is being treated conservatively with iron given the fact that she is Jehovah's Witness. Her Lasix and Eliquis are currently on hold. She also has E. coli UTI.  Patient seen and examined at bedside.  She reports that she overall is not doing well.  She does endorse some nausea and shortness of breath.  I did inquire  about her seeing a nephrologist in the past given her significant proteinuria and she reports that she was supposed to schedule this for quite some time.  She is not sure who she is supposed to see.  Medications:  Current Facility-Administered Medications  Medication Dose Route Frequency Provider Last Rate Last Admin  . 0.9 %  sodium chloride infusion   Intravenous Continuous Tat, David, MD 100 mL/hr at 03/20/21 0532 Restarted at 03/20/21 0532  . acetaminophen (TYLENOL) tablet 650 mg  650 mg Oral Q6H PRN Tat, Shanon Brow, MD       Or  . acetaminophen (TYLENOL) suppository 650 mg  650 mg Rectal Q6H PRN Tat, Shanon Brow, MD      . bisacodyl (DULCOLAX) EC tablet 5 mg  5 mg Oral Daily PRN Orson Eva, MD   5 mg at 03/17/21 1707  . Chlorhexidine Gluconate Cloth 2 % PADS 6 each  6 each Topical Daily Tat, David, MD   6 each at 03/20/21 [phone]  . Darbepoetin Alfa (ARANESP) injection 60 mcg  60 mcg Subcutaneous Once Pham, [place], RPH-CPP      . dextrose 50 % solution 0-50 mL  0-50 mL Intravenous PRN Tat, David, MD      . ezetimibe (ZETIA) tablet 10 mg  10 mg Oral Daily Tat, David, MD   10 mg at 03/20/21 0908  . ferrous sulfate tablet 325 mg  325 mg Oral BID WC Orson Eva, MD   325 mg at 03/20/21 0907  . hydrALAZINE (APRESOLINE) injection 10 mg  10 mg Intravenous Q4H PRN Tat, David, MD      . HYDROmorphone (DILAUDID) injection 0.5 mg  0.5 mg Intravenous Q4H PRN Elgergawy, Silver Huguenin, MD   0.5 mg at 03/20/21 1135  . insulin aspart (novoLOG) injection 0-15 Units  0-15 Units Subcutaneous TID WC Orson Eva, MD   3 Units at 03/20/21 1304  . insulin aspart (novoLOG) injection 0-5 Units  0-5 Units Subcutaneous Benay Pike, MD   2 Units at 03/18/21 2141  . insulin glargine (LANTUS) injection 12 Units  12 Units Subcutaneous Daily Tat, David, MD   12 Units at 03/19/21 1253  . insulin glargine (LANTUS) injection 25 Units  25 Units Subcutaneous QPM Orson Eva, MD   25 Units at 03/19/21 1848  . ipratropium (ATROVENT) nebulizer  solution 0.5 mg  0.5 mg Nebulization Q6H PRN Tat, David, MD      . levalbuterol Penne Lash) nebulizer solution 0.63 mg  0.63 mg Nebulization Q6H PRN Tat, David, MD      . magnesium citrate solution 1 Bottle  1 Bottle Oral Once PRN Tat, Shanon Brow, MD      . metoCLOPramide (REGLAN) injection 10 mg  10 mg Intravenous Franco Collet, MD   10 mg at 03/20/21 0500  . ondansetron (ZOFRAN) tablet 4 mg  4 mg Oral Q6H PRN Tat, David, MD       Or  . ondansetron [facility]) injection 4 mg  4 mg Intravenous Q6H PRN Orson Eva, MD   4 mg at 03/19/21 2059  . oxyCODONE (Oxy IR/ROXICODONE) immediate release tablet 5 mg  5 mg Oral Q4H PRN Tat, David, MD      . piperacillin-tazobactam (ZOSYN) IVPB 3.375 g  3.375 g Intravenous Therisa Doyne, MD 12.5 mL/hr at 03/20/21 0912 3.375 g at  03/20/21 0912  . senna-docusate (Senokot-S) tablet 1 tablet  1 tablet Oral QHS PRN Orson Eva, MD   1 tablet at 03/17/21 2116  . sodium bicarbonate tablet 650 mg  650 mg Oral BID Orson Eva, MD   650 mg at 03/20/21 0908  . sodium chloride flush (NS) 0.9 % injection 3 mL  3 mL Intravenous Therisa Doyne, MD   3 mL at 03/19/21 2056  . sodium chloride flush (NS) 0.9 % injection 5 mL  5 mL Intracatheter Q8H Suttle, Rosanne Ashing, MD   5 mL at 03/20/21 0454  . traZODone (DESYREL) tablet 25 mg  25 mg Oral QHS PRN Orson Eva, MD         ALLERGIES Patient has no known allergies.  MEDICAL HISTORY Past Medical History:  Diagnosis Date  . Arthritis   . Cardiomyopathy ([place])   . Diabetes mellitus without complication ([place])   . Hypertension   . Stroke [facility])      SOCIAL HISTORY Social History   Socioeconomic History  . Marital status: Divorced    Spouse name: Not on file  . Number of children: Not on file  . Years of education: Not on file  . Highest education level: Not on file  Occupational History  . Not on file  Tobacco Use  . Smoking status: Never Smoker  . Smokeless tobacco: Never Used  Vaping Use  . Vaping Use: Never used  Substance  and Sexual Activity  . Alcohol use: No  . Drug use: Never  . Sexual activity: Not on file  Other Topics Concern  . Not on file  Social History Narrative  . Not on file   Social Determinants of Health   Financial Resource Strain: Not on file  Food Insecurity: Not on file  Transportation Needs: Not on file  Physical Activity: Not on file  Stress: Not on file  Social Connections: Not on file  Intimate Partner Violence: Not on file     FAMILY HISTORY Family History  Problem Relation Age of Onset  . Cancer Mother   . Diabetes Mother   . Heart disease Father      Review of Systems: 12 systems reviewed Otherwise as per HPI, all other systems reviewed and negative  Physical Exam: Vitals:   03/20/21 0752 03/20/21 1156  BP: 117/63 109/64  Pulse: 65 68  Resp: 18 17  Temp: 98 F (36.7 C) 97.9 F (36.6 C)  SpO2: 100% 93%   Total I/O In: 240 [P.O.:240] Out: 90 [Drains:90]  Intake/Output Summary (Last 24 hours) at 03/20/2021 1316 Last data filed at 03/20/2021 1014 Gross per 24 hour  Intake 2803 ml  Output 270 ml  Net 2533 ml   General: Chronically ill-appearing, laying flat in bed HEENT: anicteric sclera, oropharynx clear without lesions CV: regular rate, normal rhythm, no murmurs, no gallops, no rubs Lungs: Fine crackles bibasilar, unlabored Abd: soft, ruq drain in place, RUQ tenderness on palpation, non-distended Skin: no visible lesions or rashes Psych: alert, engaged, appropriate mood and affect Musculoskeletal: trace edema Neuro: tired appearing, awake, alert, speech clear and coherent (but slow), moves all ext spontaneously Test Results Reviewed Lab Results  Component Value Date   NA 131 (L) 03/20/2021   K 4.0 03/20/2021   CL 106 03/20/2021   CO2 15 (L) 03/20/2021   BUN 90 (H) 03/20/2021   CREATININE 3.96 (H) 03/20/2021   CALCIUM 6.6 (L) 03/20/2021   ALBUMIN 2.2 (L) 03/20/2021     I have reviewed  all relevant outside healthcare records related to the  patient's kidney injury.

## 2021-03-20 NOTE — Progress Notes (Signed)
Initial Nutrition Assessment  DOCUMENTATION CODES:  Obesity unspecified  INTERVENTION:  Advance diet as medically able.  Add Ensure Enlive po TID, each supplement provides 140 kcal and 5 grams of protein.  Add MVI with minerals daily.  NUTRITION DIAGNOSIS:  Inadequate oral intake related to inability to eat,lethargy/confusion,decreased appetite as evidenced by meal completion < 50%,per patient/family report.  GOAL:  Patient will meet greater than or equal to 90% of their needs  MONITOR:  PO intake,Supplement acceptance,Labs,Diet advancement  REASON FOR ASSESSMENT:  Malnutrition Screening Tool    ASSESSMENT:  70 yo female with a PMH of T2DM, cardiomyopathy, HTN, and stroke presents with DKA and AKI after 2-day hx of N/V and generalized weakness. Recently started on insulin but had not been taking it on admission. 3/24 - advanced to full liquid diet  Pt very weak during visit. She was unable to interact fully, but she was able to report that she hadn't been eating really well at home. She was living with her son and he had been cooking, and she says he's a really good cook. She reports really liking salads.  She reports some weight loss recently. But unable to report how much. She apologized profusely throughout the visit - RD calmed her down.  She also reported not liking supplement products of any kind, but she does like 2% milk and is agreeable to [facility] CIB. Recommend this TID and adding MVI to regimen.  Relevant Medications: ferrous sulfate 325 mg BID, SSI, bedtime novolog, evening lantus, daily lantus, Reglan, Na Bicarb 650 mg BID Labs: reviewed; CBG 122-183, Na 131 HbA1c: 9.1% (02/2021)  NUTRITION - FOCUSED PHYSICAL EXAM: Flowsheet Row Most Recent Value  Orbital Region Mild depletion  Upper Arm Region No depletion  Thoracic and Lumbar Region No depletion  Buccal Region No depletion  Temple Region No depletion  Clavicle Bone Region No depletion  Clavicle and  Acromion Bone Region No depletion  Scapular Bone Region Unable to assess  Dorsal Hand No depletion  Patellar Region No depletion  Anterior Thigh Region No depletion  Posterior Calf Region No depletion  Edema (RD Assessment) None  Hair Reviewed  Eyes Reviewed  [pale conjunctiva]  Mouth Reviewed  Skin Reviewed  Nails Reviewed  [pale nail beds]     Diet Order:   Diet Order            Diet full liquid Room service appropriate? Yes; Fluid consistency: Thin  Diet effective now                EDUCATION NEEDS:  Not appropriate for education at this time  Skin:  Skin Assessment: Reviewed RN Assessment  Last BM:  03/20/21 - Type 7, large amount x2  Height:  Ht Readings from Last 1 Encounters:  03/16/21 5\' 6"  (1.676 m)   Weight:  Wt Readings from Last 1 Encounters:  03/20/21 96.5 kg   Ideal Body Weight:  59.1 kg  BMI:  Body mass index is 34.34 kg/m.  Estimated Nutritional Needs:  Kcal:  1600-1800 Protein:  75-90 grams Fluid:  >1.6 L  Derrel Nip, RD, LDN Registered Dietitian After Hours/Weekend Pager # in [place]

## 2021-03-20 NOTE — Progress Notes (Signed)
PROGRESS NOTE  Tracey Bryant JSE:[id number] DOB: 1951-02-11 DOA: 03/16/2021 PCP: Coolidge Breeze, FNP  Brief History:   70 year old female with a history of diabetes mellitus type 2, cardiomyopathy, permanent pacemaker, A. Fib on Eliquis, hypertension, stroke presenting with 2-day history of nausea, vomiting, and generalized weakness.    And abdominal pain, she had ED visit due to epistaxis, treated with Afrin and cotton ball with some improvement, patient has another ED visit secondary to DKA, as well she was also noted to be anemic.  She is a Restaurant manager, fast food.  CT of the abdomen and pelvis showed small perihepatic ascites, cholelithiasis with gallbladder wall thickening without biliary ductal dilatation.  RUQ [place] confirmed acute calculous cholecystitis.  General surgery at [facility] altered, undetermined patient is high risk for surgery given her cardiomyopathy and anemia, patient was transferred to [facility] 3/24 for percutaneous gallbladder drain by IR , as well hospital stay was noted for worsening anemia, worsening renal failure .  Subjective:  Patient reports generalized weakness, fatigue, reports abdominal pain has improved, no vomiting, diarrhea or shortness of breath .  Assessment/Plan:  DKA type II -Patient clinically patient, she has not been on heparin drip, gap has closed, she is transitioned to treatment. -Continue with Lantus, will consolidate this to 20 units subcu daily, continue with insulin sliding scale -HbA1C--9.1  Severe Sepsis -present on admission -had hypotension, leukocytosis, elevated lactate, tachycardia -due to UTI and cholecystitis -Urine culture growing pansensitive E. Coli. -He does not want any ER remain negative -continue Zosyn -continue judicious fluids -BP, tachycardia improved -rapid rise in WBC-->pt received dose Granix on 03/17/21 (unclear reason)  Acute cholecystitis -3/23 CT abd as above -03/18/21 abd US-GB wall  thickened with impacted stones in GB neck; no hydronephrosis -concerned about cholecystitis -general surgery consulted>>poor surgical candidate>>IR placed percutaneous gallbladder drain 3/24. -Drain care by IR. -Continue with IV Zosyn.  Leukemoid reaction -pt received Granix on 03/17/21 at 1700  UTI--GNR -E. coli, pansensitive, continue with Zosyn  AKI on CKD -baseline creatinine ~1.6-1.7, trending up, 3.96 today, this is most likely in the setting of hypotension/sepsis -No improvement despite receiving IV fluids. -Per previous documentation patient with history of renal failure with nephrotic symptom proteinuria -Avoid nephrotoxic medications, continue with bicarb. -Renal consulted regarding further recommendations.  Anemia, multifactorial due to anemia of acute blood loss and anemia of chronic kidney disease -Secondary to epistaxis, CKD -apixaban on hold -FOBT negative x2, then positive, this is likely due to epistaxis, significant GI -03/18/21 FOBT Positive AFTER patient started on oral iron -Patient is Jehovah's Witness, she dosen't want any PRBC. -Iron saturation 14, ferritin 99 -We will give Procrit, continue with ferrous sulfate  Chronic systolic and diastolic CHF -0/73/71 Echo EF 25-30%, G2DD, global HK -While discussing medicine, patient with known history of A. fib, on Eliquis acute CVA in the past, she required permanent pacemaker insertion around 8 months ago at [facility], but she did agree to follow with cardiology as an outpatient acute. -Patient remains soft, no Coreg or Entresto.  Transaminasemia -due to sepsis/hypotension and cholecystitis -has plateaued and now trending down  History of stroke -Apixaban on hold secondary to acute blood loss anemia  Hyperlipidemia -Continue Zetia  Hyperkalemia -lokelma x 1 -improved     Status is: Inpatient  Remains inpatient appropriate because:Hemodynamically unstable, Unsafe d/c plan and IV  treatments appropriate due to intensity of illness or inability to take PO   Dispo: The  patient is from: Home  Anticipated d/c is to: Home  Patient currently is not medically stable to d/c.              Difficult to place patient No        Family Communication:  son updated 3/25  Consultants:  General surgery  Code Status:  FULL   DVT Prophylaxis:  SCDs   Procedures: As Listed in Progress Note Above  Antibiotics: Zosyn 3/22>>       Objective: Vitals:   03/20/21 0400 03/20/21 0455 03/20/21 0752 03/20/21 1156  BP:   117/63 109/64  Pulse:   65 68  Resp: 20  18 17   Temp: (!) 97.3 F (36.3 C)  98 F (36.7 C) 97.9 F (36.6 C)  TempSrc: Axillary  Axillary Axillary  SpO2:   100% 93%  Weight:  96.5 kg    Height:        Intake/Output Summary (Last 24 hours) at 03/20/2021 1407 Last data filed at 03/20/2021 1014 Gross per 24 hour  Intake 2803 ml  Output 270 ml  Net 2533 ml   Weight change: 6.5 kg Exam:  Awake Alert, frail, deconditioned still ill-appearing  Symmetrical Chest wall movement, Good air movement bilaterally, CTAB RRR,No Gallops,Rubs or new Murmurs, No Parasternal Heave +ve B.Sounds, Abd Soft, right upper quadrant drain present minimal tenderness has significantly improved. No Cyanosis, Clubbing or edema, No new Rash or bruise    Data Reviewed: I have personally reviewed following labs and imaging studies Basic Metabolic Panel: Recent Labs  Lab 03/17/21 0456 03/17/21 1003 03/18/21 0630 03/19/21 0504 03/20/21 0147  NA 137 136 131* 135 131*  K 4.7 4.3 5.2* 4.1 4.0  CL 110 109 104 106 106  CO2 17* 17* 13* 14* 15*  GLUCOSE 184* 171* 323* 184* 146*  BUN 64* 66* 78* 85* 90*  CREATININE 2.41* 2.50* 3.40* 3.59* 3.96*  CALCIUM 7.8* 7.7* 7.4* 7.1* 6.6*   Liver Function Tests: Recent Labs  Lab 03/16/21 1300 03/17/21 1003 03/18/21 0924 03/19/21 0504 03/20/21 0147  AST 45* 248* 804* 396* 317*  ALT 29  222* 794* 792* 723*  ALKPHOS 37* 34* 40 44 50  BILITOT 1.0 0.5 0.6 0.7 0.6  PROT 6.3* 5.8* 5.5* 5.5* 5.2*  ALBUMIN 2.9* 2.7* 2.6* 2.5* 2.2*   Recent Labs  Lab 03/16/21 1300  LIPASE 32   No results for input(s): AMMONIA in the last 168 hours. Coagulation Profile: Recent Labs  Lab 03/17/21 0456 03/18/21 0630 03/19/21 0504 03/20/21 0147  INR 1.2 1.6* 1.4* 1.6*   CBC: Recent Labs  Lab 03/15/21 1316 03/16/21 1300 03/17/21 0456 03/18/21 0630 03/19/21 0504 03/20/21 0147  WBC 13.8* 16.3* 21.9* 55.8* 50.6* 37.0*  NEUTROABS 10.7* 13.7*  --   --   --   --   HGB 8.5* 7.1* 6.5* 6.8* 6.9* 6.1*  HCT 26.8* 22.8* 20.9* 22.2* 21.9* 19.0*  MCV 92.4 94.6 95.0 97.4 96.5 92.7  PLT 323 314 293 292 298 270   Cardiac Enzymes: No results for input(s): CKTOTAL, CKMB, CKMBINDEX, TROPONINI in the last 168 hours. BNP: Invalid input(s): POCBNP CBG: Recent Labs  Lab 03/19/21 1251 03/19/21 1808 03/19/21 2041 03/20/21 0758 03/20/21 1201  GLUCAP 156* 125* 183* 122* 178*   HbA1C: No results for input(s): HGBA1C in the last 72 hours. Urine analysis:    Component Value Date/Time   COLORURINE AMBER (A) 03/17/2021 1208   APPEARANCEUR CLOUDY (A) 03/17/2021 1208   LABSPEC 1.015 03/17/2021 1208   PHURINE 5.0  03/17/2021 1208   GLUCOSEU 50 (A) 03/17/2021 1208   HGBUR SMALL (A) 03/17/2021 [place] 03/17/2021 [place] 03/17/2021 1208   PROTEINUR >=300 (A) 03/17/2021 1208   UROBILINOGEN 0.2 01/17/2013 1910   NITRITE NEGATIVE 03/17/2021 1208   LEUKOCYTESUR LARGE (A) 03/17/2021 1208   Sepsis Labs: @LABRCNTIP (procalcitonin:4,lacticidven:4) ) Recent Results (from the past 240 hour(s))  SARS CORONAVIRUS 2 (TAT 6-24 HRS) Nasopharyngeal Nasopharyngeal Swab     Status: None   Collection Time: 03/16/21  1:32 PM   Specimen: Nasopharyngeal Swab  Result Value Ref Range Status   SARS Coronavirus 2 NEGATIVE NEGATIVE Final    Comment: (NOTE) SARS-CoV-2 target  nucleic acids are NOT DETECTED.  The SARS-CoV-2 RNA is generally detectable in upper and lower respiratory specimens during the acute phase of infection. Negative results do not preclude SARS-CoV-2 infection, do not rule out co-infections with other pathogens, and should not be used as the sole basis for treatment or other patient management decisions. Negative results must be combined with clinical observations, patient history, and epidemiological information. The expected result is Negative.  Fact Sheet for Patients: SugarRoll.be  Fact Sheet for Healthcare Providers: https://www.woods-mathews.com/  This test is not yet approved or cleared by the [place] FDA and  has been authorized for detection and/or diagnosis of SARS-CoV-2 by FDA under an Emergency Use Authorization (EUA). This EUA will remain  in effect (meaning this test can be used) for the duration of the COVID-19 declaration under Se ction 564(b)(1) of the Act, 21 U.S.C. section 360bbb-3(b)(1), unless the authorization is terminated or revoked sooner.  Performed at [facility], [place] [address], [place], [place] [address]   MRSA PCR Screening     Status: None   Collection Time: 03/16/21  5:06 PM   Specimen: Nasal Mucosa; Nasopharyngeal  Result Value Ref Range Status   MRSA by PCR NEGATIVE NEGATIVE Final    Comment:        The GeneXpert MRSA Assay (FDA approved for NASAL specimens only), is one component of a comprehensive MRSA colonization surveillance program. It is not intended to diagnose MRSA infection nor to guide or monitor treatment for MRSA infections. Performed at [facility], [address], [place], [place] [address]   Culture, Urine     Status: Abnormal   Collection Time: 03/17/21 12:08 PM   Specimen: Urine, Clean Catch  Result Value Ref Range Status   Specimen Description   Final    URINE, CLEAN CATCH Performed at [facility], [address], [place], [place] [address]    Special Requests   Final    NONE Performed at [facility], [address], [place], [place] [address]    Culture >=100,000 COLONIES/mL ESCHERICHIA COLI (A)  Final   Report Status 03/19/2021 FINAL  Final   Organism ID, Bacteria ESCHERICHIA COLI (A)  Final      Susceptibility   Escherichia coli - MIC*    AMPICILLIN 4 SENSITIVE Sensitive     CEFAZOLIN <=4 SENSITIVE Sensitive     CEFEPIME <=0.12 SENSITIVE Sensitive     CEFTRIAXONE <=0.25 SENSITIVE Sensitive     CIPROFLOXACIN <=0.25 SENSITIVE Sensitive     GENTAMICIN <=1 SENSITIVE Sensitive     IMIPENEM <=0.25 SENSITIVE Sensitive     NITROFURANTOIN <=16 SENSITIVE Sensitive     TRIMETH/SULFA <=20 SENSITIVE Sensitive     AMPICILLIN/SULBACTAM <=2 SENSITIVE Sensitive     PIP/TAZO <=4 SENSITIVE Sensitive     * >=100,000 COLONIES/mL  ESCHERICHIA COLI  Culture, blood (routine x 2)     Status: None (Preliminary result)   Collection Time: 03/17/21  1:18 PM   Specimen: BLOOD  Result Value Ref Range Status   Specimen Description BLOOD LEFT ANTECUBITAL  Final   Special Requests   Final    Blood Culture adequate volume BOTTLES DRAWN AEROBIC AND ANAEROBIC   Culture   Final    NO GROWTH 3 DAYS Performed at [facility], [address] [place], [place] [address]    Report Status PENDING  Incomplete  Culture, blood (routine x 2)     Status: None (Preliminary result)   Collection Time: 03/17/21  1:19 PM   Specimen: BLOOD LEFT HAND  Result Value Ref Range Status   Specimen Description BLOOD LEFT HAND  Final   Special Requests   Final    Blood Culture results may not be optimal due to an inadequate volume of blood received in culture bottles BOTTLES DRAWN AEROBIC ONLY   Culture   Final    NO GROWTH 3 DAYS Performed at [facility], [address] [place], [place] [address]    Report Status PENDING  Incomplete  Aerobic/Anaerobic Culture (surgical/deep wound)     Status: None (Preliminary result)   Collection  Time: 03/19/21  2:51 PM   Specimen: Abdomen; Abdominal Fluid  Result Value Ref Range Status   Specimen Description ABDOMEN  Final   Special Requests GALLBLADDER  Final   Gram Stain NO WBC SEEN NO ORGANISMS SEEN   Final   Culture   Final    NO GROWTH < 24 HOURS Performed at [facility], [place] [address] [place], [place] [address]    Report Status PENDING  Incomplete     Scheduled Meds: . Chlorhexidine Gluconate Cloth  6 each Topical Daily  . darbepoetin (ARANESP) injection - NON-DIALYSIS  60 mcg Subcutaneous Once  . ezetimibe  10 mg Oral Daily  . ferrous sulfate  325 mg Oral BID WC  . insulin aspart  0-15 Units Subcutaneous TID WC  . insulin aspart  0-5 Units Subcutaneous QHS  . insulin glargine  12 Units Subcutaneous Daily  . insulin glargine  25 Units Subcutaneous QPM  . metoCLOPramide (REGLAN) injection  10 mg Intravenous Q8H  . sodium bicarbonate  650 mg Oral BID  . sodium chloride flush  3 mL Intravenous Q12H  . sodium chloride flush  5 mL Intracatheter Q8H   Continuous Infusions: . sodium chloride 100 mL/hr at 03/20/21 0532  . piperacillin-tazobactam (ZOSYN)  IV 3.375 g (03/20/21 0912)    Procedures/Studies: CT ABDOMEN PELVIS WO CONTRAST  Result Date: 03/17/2021 CLINICAL DATA:  Nonlocalized abdominal pain. Increased lactic acid. Lethargy. Confusion. EXAM: CT ABDOMEN AND PELVIS WITHOUT CONTRAST TECHNIQUE: Multidetector CT imaging of the abdomen and pelvis was performed following the standard protocol without IV contrast. COMPARISON:  01/17/2013 FINDINGS: Lower chest: Left lower lobe 9 mm pulmonary nodule on 25/4 is similar in 2014 and considered benign. Bibasilar subsegmental atelectasis. Mild cardiomegaly with pacer. Distal right coronary artery calcification. Small bilateral pleural effusions. Hepatobiliary: Small volume perihepatic ascites. Subtle hypoattenuation involving the anterior portion of segment 4 B, including on 28/2. On the order of 5.0 cm. 2.0 cm  gallstone with gallbladder wall thickening up to 9 mm. No biliary duct dilatation. Pancreas: Fatty replacement involving the pancreatic head and uncinate process. Spleen: Normal in size, without focal abnormality. Adrenals/Urinary Tract: Normal adrenal glands. Mild renal cortical thinning bilaterally. Interpolar right renal exophytic 7.4 cm  fluid density lesion with calcification in its wall. Trace air within the nondependent bladder including on 79/2. No ureteric or bladder calculi. Stomach/Bowel: Proximal gastric underdistention. Colonic stool burden suggests constipation. Normal terminal ileum and appendix. Normal small bowel. Vascular/Lymphatic: Advanced aortic and branch vessel atherosclerosis. Retroaortic left renal vein. No abdominopelvic adenopathy. Reproductive: Normal uterus and adnexa. Other: Small volume abdominopelvic fluid including adjacent the liver and spleen. Musculoskeletal: Mild anasarca. Remote eighth posterior left rib fracture. IMPRESSION: 1. Cholelithiasis with gallbladder wall thickening. Correlate with right upper quadrant symptoms and possibly ultrasound to exclude acute cholecystitis. 2.  Possible constipation. 3. Bilateral small pleural effusions and abdominopelvic ascites with anasarca. Question fluid overload. 4. Coronary artery atherosclerosis. Aortic Atherosclerosis (ICD10-I70.0). 5. Segment 4 B hypoattenuation could represent prominent focal steatosis but is indeterminate. Consider nonemergent, outpatient pre and post contrast abdominal MRI. 6. Trace air within the bladder.  Correlate with instrumentation. Electronically Signed   By: Abigail Miyamoto M.D.   On: 03/17/2021 16:38   US Abdomen Complete  Result Date: 03/18/2021 CLINICAL DATA:  Cholelithiasis, acute renal failure EXAM: ABDOMEN ULTRASOUND COMPLETE COMPARISON:  CT 03/17/2021 FINDINGS: Gallbladder: The gallbladder is distended. Several shadowing gallstones are seen impacted within the gallbladder neck and there is layering  sludge within the gallbladder. The gallbladder wall is markedly thickened measuring up to 14 mm in diameter and mild pericholecystic fluid is identified. The sonographic Percell Miller sign is reportedly positive. Common bile duct: Diameter: 4-5 mm in proximal diameter. Liver: No focal lesion identified. Within normal limits in parenchymal echogenicity. Portal vein is patent on color Doppler imaging with normal direction of blood flow towards the liver. IVC: No abnormality visualized. Pancreas: Visualized portion unremarkable. Spleen: Size and appearance within normal limits. Right Kidney: Length: 10.9 cm. Echogenicity within normal limits. No mass or hydronephrosis visualized. 8.1 cm simple cortical cyst identified within the upper pole. Left Kidney: Length: 11.0 cm. Echogenicity within normal limits. No mass or hydronephrosis visualized. Abdominal aorta: No aneurysm visualized, though the distal aorta is obscured by overlying bowel gas. Other findings: Small right pleural effusion is present. Mild perihepatic and perisplenic ascites is noted. IMPRESSION: Acute calculus cholecystitis. Mild ascites.  Small right pleural effusion. 8.1 cm right upper pole simple cortical cyst. This is best characterized as a Bosniak class 1 cyst and further follow-up is not required. Electronically Signed   By: Fidela Salisbury MD   On: 03/18/2021 12:51   US Abdomen Limited  Result Date: 03/18/2021 CLINICAL DATA:  Right upper quadrant abdominal pain EXAM: ULTRASOUND ABDOMEN LIMITED RIGHT UPPER QUADRANT COMPARISON:  CT 03/17/2021 FINDINGS: Gallbladder: The gallbladder is distended, there is marked gallbladder wall thickening, and mild pericholecystic fluid identified. The gallbladder contains layering sludge and stones. Stones are seen within the gallbladder neck, however, decubitus positioning was not performed to assess for impaction. The sonographic Percell Miller sign is reportedly positive. Common bile duct: Diameter: 4 mm in proximal diameter  Liver: No focal lesion identified. Within normal limits in parenchymal echogenicity. Portal vein is patent on color Doppler imaging with normal direction of blood flow towards the liver. Other: Mild perihepatic ascites is present. Small right pleural effusion is noted. 8.9 cm simple cortical cyst is noted within the upper pole of the right kidney. IMPRESSION: Acute calculus cholecystitis. Mild perihepatic ascites and small right pleural effusion. Electronically Signed   By: Fidela Salisbury MD   On: 03/18/2021 12:46   DG Chest Port 1 View  Result Date: 03/20/2021 CLINICAL DATA:  Hypoxia. EXAM: PORTABLE CHEST 1 VIEW COMPARISON:  January 17, 2013. FINDINGS: Stable cardiomediastinal silhouette. Mild central pulmonary vascular congestion is noted. No pneumothorax or pleural effusion is noted. No consolidative process is noted. 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 Bony thorax is unremarkable. IMPRESSION: Mild central pulmonary vascular congestion. Electronically Signed   By: Marijo Conception M.D.   On: 03/20/2021 08:55   ECHOCARDIOGRAM COMPLETE  Result Date: 03/18/2021    ECHOCARDIOGRAM REPORT   Patient Name:   Tracey Bryant Date of Exam: 03/18/2021 Medical Rec #:  [id number]       Height:       66.0 in Accession #:    [id number]      Weight:       180.8 lb Date of Birth:  Aug 25, 1951       BSA:          1.916 m Patient Age:    6 years        BP:           126/51 mmHg Patient Gender: F               HR:           71 bpm. Exam Location:  [facility] Procedure: 2D Echo and Intracardiac Opacification Agent Indications:    CHF-Acute Systolic Q46.96                 SBE I33.9  History:        Patient has no prior history of Echocardiogram examinations.                 Stroke; Risk Factors:Non-Smoker, Diabetes and Hypertension.                 Rheumatoid Arthritis.  Sonographer:    Leavy Cella RDCS (AE) Referring Phys: [phone] SEYED A SHAHMEHDI IMPRESSIONS  1. Left ventricular ejection fraction, by  estimation, is 25 to 30%. The left ventricle has normal function. The left ventricle demonstrates global hypokinesis. There is mild left ventricular hypertrophy. Left ventricular diastolic parameters are consistent with Grade II diastolic dysfunction (pseudonormalization). Elevated left atrial pressure.  2. RV poorly visualized, grossly appears enlarged with decreased systolic function. . Right ventricular systolic function was not well visualized. The right ventricular size is not well visualized.  3. Left atrial size was severely dilated.  4. The mitral valve is normal in structure. No evidence of mitral valve regurgitation. No evidence of mitral stenosis.  5. The aortic valve is tricuspid. There is mild calcification of the aortic valve. There is mild thickening of the aortic valve. Aortic valve regurgitation is not visualized. No aortic stenosis is present.  6. Mild pulmonary HTN, PASP is 36 mmHg.  7. The inferior vena cava is normal in size with greater than 50% respiratory variability, suggesting right atrial pressure of 3 mmHg. FINDINGS  Left Ventricle: Left ventricular ejection fraction, by estimation, is 25 to 30%. The left ventricle has normal function. The left ventricle demonstrates global hypokinesis. The left ventricular internal cavity size was normal in size. There is mild left  ventricular hypertrophy. Left ventricular diastolic parameters are consistent with Grade II diastolic dysfunction (pseudonormalization). Elevated left atrial pressure. Right Ventricle: RV poorly visualized, grossly appears enlarged with decreased systolic function. The right ventricular size is not well visualized. Right vetricular wall thickness was not well visualized. Right ventricular systolic function was not well  visualized. Left Atrium: Left atrial size was severely dilated. Right Atrium: Right atrial size was not well visualized. Pericardium: There is  no evidence of pericardial effusion. Mitral Valve: The mitral  valve is normal in structure. There is mild thickening of the mitral valve leaflet(s). There is mild calcification of the mitral valve leaflet(s). Mild mitral annular calcification. No evidence of mitral valve regurgitation. No evidence of mitral valve stenosis. Tricuspid Valve: The tricuspid valve is normal in structure. Tricuspid valve regurgitation is mild . No evidence of tricuspid stenosis. Aortic Valve: The aortic valve is tricuspid. There is mild calcification of the aortic valve. There is mild thickening of the aortic valve. There is mild aortic valve annular calcification. Aortic valve regurgitation is not visualized. No aortic stenosis  is present. Aortic valve mean gradient measures 2.3 mmHg. Aortic valve peak gradient measures 4.6 mmHg. Aortic valve area, by VTI measures 1.70 cm. Pulmonic Valve: The pulmonic valve was not well visualized. Pulmonic valve regurgitation is not visualized. No evidence of pulmonic stenosis. Aorta: The aortic root is normal in size and structure. Pulmonary Artery: Mild pulmonary HTN, PASP is 36 mmHg. Venous: The inferior vena cava is normal in size with greater than 50% respiratory variability, suggesting right atrial pressure of 3 mmHg. IAS/Shunts: No atrial level shunt detected by color flow Doppler.  LEFT VENTRICLE PLAX 2D LVIDd:         4.08 cm  Diastology LVIDs:         3.43 cm  LV e' medial:    4.79 cm/s LV PW:         1.34 cm  LV E/e' medial:  24.4 LV IVS:        1.08 cm  LV e' lateral:   7.83 cm/s LVOT diam:     1.80 cm  LV E/e' lateral: 14.9 LV SV:         33 LV SV Index:   17 LVOT Area:     2.54 cm  RIGHT VENTRICLE RV S prime:     9.90 cm/s TAPSE (M-mode): 1.2 cm LEFT ATRIUM             Index       RIGHT ATRIUM           Index LA diam:        4.60 cm 2.40 cm/m  RA Area:     12.90 cm LA Vol (A2C):   61.2 ml 31.95 ml/m RA Volume:   28.60 ml  14.93 ml/m LA Vol (A4C):   92.0 ml 48.02 ml/m LA Biplane Vol: 78.2 ml 40.82 ml/m  AORTIC VALVE AV Area (Vmax):    1.82  cm AV Area (Vmean):   1.50 cm AV Area (VTI):     1.70 cm AV Vmax:           107.72 cm/s AV Vmean:          71.452 cm/s AV VTI:            0.191 m AV Peak Grad:      4.6 mmHg AV Mean Grad:      2.3 mmHg LVOT Vmax:         77.12 cm/s LVOT Vmean:        42.238 cm/s LVOT VTI:          0.128 m LVOT/AV VTI ratio: 0.67  AORTA Ao Root diam: 2.50 cm MITRAL VALVE                TRICUSPID VALVE MV Area (PHT): 4.77 cm     TR Peak grad:   33.2 mmHg MV Decel Time: 159 msec  TR Vmax:        288.00 cm/s MV E velocity: 117.00 cm/s MV A velocity: 28.40 cm/s   SHUNTS MV E/A ratio:  4.12         Systemic VTI:  0.13 m                             Systemic Diam: 1.80 cm Carlyle Dolly MD Electronically signed by Carlyle Dolly MD Signature Date/Time: 03/18/2021/2:24:49 PM    Final    CT IMAGE GUIDED DRAINAGE BY PERCUTANEOUS CATHETER  Result Date: 03/19/2021 INDICATION: Acute cholecystitis EXAM: CT -GUIDED CHOLECYSTOSTOMY TUBE PLACEMENT COMPARISON:  03/17/2021 MEDICATIONS: The patient is currently admitted to the hospital and on intravenous antibiotics. Antibiotics were administered within an appropriate time frame prior to skin puncture. ANESTHESIA/SEDATION: Moderate (conscious) sedation was employed during this procedure. A total of Versed 1.5 mg and Fentanyl 75 mcg was administered intravenously. Moderate Sedation Time: 20 minutes. The patient's level of consciousness and vital signs were monitored continuously by radiology nursing throughout the procedure under my direct supervision. CONTRAST:  None. FLUOROSCOPY TIME:  None. COMPLICATIONS: None immediate. PROCEDURE: Informed written consent was obtained from the patient after a discussion of the risks, benefits and alternatives to treatment. Questions regarding the procedure were encouraged and answered. A timeout was performed prior to the initiation of the procedure. The right upper abdominal quadrant was prepped and draped in the usual sterile fashion, and a sterile  drape was applied covering the operative field. Maximum barrier sterile technique with sterile gowns and gloves were used for the procedure. A timeout was performed prior to the initiation of the procedure. Local anesthesia was provided with 1% lidocaine with epinephrine. Limited upper abdominal CT demonstrates a markedly dilated gallbladder. Utilizing a transhepatic approach, an 18 gauge trocar needle was advanced into the gallbladder under intermittent CT guidance. An Amplatz wire was inserted and coiled within the gallbladder. Appropriate position was confirmed with limited CT. Serial dilation was performed and ultimately a 10.2-French Cook multipurpose drainage tube was advanced into the gallbladder infundibulum, coiled and locked. Limited upper abdominal CT was then obtained to confirm adequate positioning. Bile was aspirated. The catheter was secured to the skin with suture, connected to a drainage bag and a dressing was placed. The patient tolerated the procedure well without immediate post procedural complication. IMPRESSION: Successful CT guided placement of a 10.2 French cholecystostomy tube. PLAN: Follow-up with cholecystostomy tube check and change in 2 months. Ruthann Cancer, MD [facility] Radiology Electronically Signed   By: Ruthann Cancer MD   On: 03/19/2021 16:39    Phillips Climes, MD  [facility]  If 7PM-7AM, please contact night-coverage [email or web address] Password [email or web address] 03/20/2021, 2:07 PM   LOS: 4 days

## 2021-03-21 DIAGNOSIS — D62 Acute posthemorrhagic anemia: Secondary | ICD-10-CM | POA: Diagnosis not present

## 2021-03-21 DIAGNOSIS — K810 Acute cholecystitis: Secondary | ICD-10-CM | POA: Diagnosis not present

## 2021-03-21 DIAGNOSIS — E0810 Diabetes mellitus due to underlying condition with ketoacidosis without coma: Secondary | ICD-10-CM | POA: Diagnosis not present

## 2021-03-21 LAB — COMPREHENSIVE METABOLIC PANEL
ALT: 847 U/L — ABNORMAL HIGH (ref 0–44)
AST: 338 U/L — ABNORMAL HIGH (ref 15–41)
Albumin: 2.3 g/dL — ABNORMAL LOW (ref 3.5–5.0)
Alkaline Phosphatase: 76 U/L (ref 38–126)
Anion gap: 14 (ref 5–15)
BUN: 92 mg/dL — ABNORMAL HIGH (ref 8–23)
CO2: 12 mmol/L — ABNORMAL LOW (ref 22–32)
Calcium: 6.7 mg/dL — ABNORMAL LOW (ref 8.9–10.3)
Chloride: 105 mmol/L (ref 98–111)
Creatinine, Ser: 3.99 mg/dL — ABNORMAL HIGH (ref 0.44–1.00)
GFR, Estimated: 12 mL/min — ABNORMAL LOW (ref >60)
Glucose, Bld: 169 mg/dL — ABNORMAL HIGH (ref 70–99)
Potassium: 4.3 mmol/L (ref 3.5–5.1)
Sodium: 131 mmol/L — ABNORMAL LOW (ref 135–145)
Total Bilirubin: 1.2 mg/dL (ref 0.3–1.2)
Total Protein: 5.7 g/dL — ABNORMAL LOW (ref 6.5–8.1)

## 2021-03-21 LAB — HEPATITIS PANEL, ACUTE
HCV Ab: NONREACTIVE
Hep A IgM: NONREACTIVE
Hep B C IgM: NONREACTIVE
Hepatitis B Surface Ag: NONREACTIVE

## 2021-03-21 LAB — CBC
HCT: 20.6 % — ABNORMAL LOW (ref 36.0–46.0)
Hemoglobin: 6.6 g/dL — CL (ref 12.0–15.0)
MCH: 30.1 pg (ref 26.0–34.0)
MCHC: 32 g/dL (ref 30.0–36.0)
MCV: 94.1 fL (ref 80.0–100.0)
Platelets: 129 10*3/uL — ABNORMAL LOW (ref 150–400)
RBC: 2.19 MIL/uL — ABNORMAL LOW (ref 3.87–5.11)
RDW: 17.4 % — ABNORMAL HIGH (ref 11.5–15.5)
WBC: 32.3 10*3/uL — ABNORMAL HIGH (ref 4.0–10.5)
nRBC: 2.8 % — ABNORMAL HIGH (ref 0.0–0.2)

## 2021-03-21 LAB — PROTIME-INR
INR: 1.5 — ABNORMAL HIGH (ref 0.8–1.2)
Prothrombin Time: 17.7 seconds — ABNORMAL HIGH (ref 11.4–15.2)

## 2021-03-21 LAB — LACTIC ACID, PLASMA: Lactic Acid, Venous: 1.4 mmol/L (ref 0.5–1.9)

## 2021-03-21 LAB — GLUCOSE, CAPILLARY
Glucose-Capillary: 109 mg/dL — ABNORMAL HIGH (ref 70–99)
Glucose-Capillary: 116 mg/dL — ABNORMAL HIGH (ref 70–99)
Glucose-Capillary: 208 mg/dL — ABNORMAL HIGH (ref 70–99)
Glucose-Capillary: 243 mg/dL — ABNORMAL HIGH (ref 70–99)

## 2021-03-21 LAB — BRAIN NATRIURETIC PEPTIDE: B Natriuretic Peptide: 1618.1 pg/mL — ABNORMAL HIGH (ref 0.0–100.0)

## 2021-03-21 MED ORDER — SODIUM BICARBONATE 650 MG PO TABS
1300.0000 mg | ORAL_TABLET | Freq: Three times a day (TID) | ORAL | Status: DC
  Administered 2021-03-21 – 2021-04-05 (×46): 1300 mg via ORAL
  Filled 2021-03-21 (×46): qty 2

## 2021-03-21 MED ORDER — NEPRO/CARBSTEADY PO LIQD
237.0000 mL | Freq: Three times a day (TID) | ORAL | Status: DC
  Administered 2021-03-21 – 2021-03-26 (×9): 237 mL via ORAL

## 2021-03-21 NOTE — Progress Notes (Signed)
PROGRESS NOTE  Tracey Bryant YIF:[id number] DOB: [birth year] DOA: 03/16/2021 PCP: Coolidge Breeze, FNP  Brief History:   70 year old female with a history of diabetes mellitus type 2, cardiomyopathy, permanent pacemaker, A. Fib on Eliquis, hypertension, stroke presenting with 2-day history of nausea, vomiting, and generalized weakness.    And abdominal pain, she had ED visit due to epistaxis, treated with Afrin and cotton ball with some improvement, patient has another ED visit secondary to DKA, as well she was also noted to be anemic.  She is a Restaurant manager, fast food.  CT of the abdomen and pelvis showed small perihepatic ascites, cholelithiasis with gallbladder wall thickening without biliary ductal dilatation.  RUQ [place] confirmed acute calculous cholecystitis.  General surgery at [facility] altered, undetermined patient is high risk for surgery given her cardiomyopathy and anemia, patient was transferred to [facility] 3/24 for percutaneous gallbladder drain by IR , as well hospital stay was noted for worsening anemia, worsening renal failure .  Subjective:  Patient reports generalized weakness, fatigue, has suprapubic fullness and urinary retention, denies dyspnea.   Assessment/Plan:  DKA type II -Patient clinically patient, she has been on insulin drip, gap has closed, she is transitioned to subcutaneous insulin treatment. -CBG controlled on current regimen of 20 units of Lantus and sliding scale. -HbA1C--9.1  Severe Sepsis -present on admission -had hypotension, leukocytosis, elevated lactate, tachycardia -due to UTI and cholecystitis -Urine culture growing pansensitive E. Coli. -He does not want any ER remain negative -continue Zosyn -Received IV fluids initially. -BP, tachycardia improved -rapid rise in WBC-->pt received dose Granix on 03/17/21 (unclear reason)  Acute cholecystitis -3/23 CT abd as above -03/18/21 abd US-GB wall thickened with impacted stones in GB  neck; no hydronephrosis -concerned about cholecystitis -general surgery consulted>>poor surgical candidate>>IR placed percutaneous gallbladder drain 3/24. -Drain care by IR.  Still with significant output.  Appears to be 360 cc output over last 24 hours. -Continue with IV Zosyn.  Transaminitis -Continues to trend up despite gallbladder drain placement, check hepatitis panel, will consult GI regarding further input  Leukemoid reaction -pt received Granix on 03/17/21 at 1700  UTI--GNR -E. coli, pansensitive, continue with Zosyn  AKI on CKD -baseline creatinine ~1.6-1.7, trending up, 3.96 today, this is most likely in the setting of hypotension/sepsis -No improvement despite receiving IV fluids. -Avoid nephrotoxic medications and contrast. -Bicarb is 12 today, will increase her bicarb to 2 tabs 3 times daily.  Urinary retention -800 cc on bladder scan today, will insert Foley catheter especially in the setting of need strict ins and out and worsening renal failure.  Anemia, multifactorial due to anemia of acute blood loss and anemia of chronic kidney disease -Secondary to epistaxis, CKD -apixaban on hold -FOBT negative x2, then positive, this is likely due to epistaxis, significant GI -03/18/21 FOBT Positive AFTER patient started on oral iron -Patient is Jehovah's Witness, she dosen't want any PRBC. -Iron saturation 14, ferritin 99 -Received Aranesp and IV iron.  History of atrial fibrillation -Eliquis on hold in the setting of profound anemia, and history of epistaxis, she is Jehovah's Witness  cannot give any PRBC transfusion.  At this point risks outweigh benefits to start her back on full anticoagulation.  Chronic systolic and diastolic CHF -8/67/67 Echo EF 25-30%, G2DD, global HK -While discussing medicine, patient with known history of A. fib, on Eliquis acute CVA in the past, she required permanent pacemaker insertion around 8 months ago at [facility], but she did not  follow with any cardiologist in outpatient setting yet. -Patient remains soft, no Coreg or Entresto.  Transaminasemia -due to sepsis/hypotension and cholecystitis -has plateaued and now trending down  History of stroke -Apixaban on hold secondary to acute blood loss anemia  Hyperlipidemia -Continue Zetia  Hyperkalemia -lokelma x 1 -improved  Thrombocytopenia -To monitor, she is off anticoagulation  Hyponatremia -Most likely due to the excess volume, monitor  Status is: Inpatient  Remains inpatient appropriate because:Hemodynamically unstable, Unsafe d/c plan and IV treatments appropriate due to intensity of illness or inability to take PO   Dispo: The patient is from: Home  Anticipated d/c is to: Home  Patient currently is not medically stable to d/c.              Difficult to place patient No        Family Communication:  son updated by phone daily  Consultants:  General surgery  Code Status:  FULL   DVT Prophylaxis:  SCDs   Procedures: As Listed in Progress Note Above  Antibiotics: Zosyn 3/22>>       Objective: Vitals:   03/21/21 0322 03/21/21 0500 03/21/21 0729 03/21/21 1131  BP: 116/60  (!) 133/53 (!) 123/52  Pulse: 65  67 72  Resp: 18  17 18   Temp: 98.1 F (36.7 C)  97.8 F (36.6 C) 98 F (36.7 C)  TempSrc: Axillary  Oral Oral  SpO2: 99%  100% 100%  Weight:  92.3 kg    Height:        Intake/Output Summary (Last 24 hours) at 03/21/2021 1312 Last data filed at 03/21/2021 1147 Gross per 24 hour  Intake 1180.69 ml  Output 1280 ml  Net -99.31 ml   Weight change: -4.2 kg Exam:  Awake Alert, Oriented X 3, frail, ill-appearing  symmetrical Chest wall movement, air entry at the bases RRR,No Gallops,Rubs or new Murmurs, No Parasternal Heave +ve B.Sounds, Abd Soft, upper quadrant drain, has suprapubic fullness No Cyanosis, Clubbing ,trace  edema, No new Rash or bruise    Data Reviewed: I  have personally reviewed following labs and imaging studies Basic Metabolic Panel: Recent Labs  Lab 03/17/21 1003 03/18/21 0630 03/19/21 0504 03/20/21 0147 03/21/21 0115  NA 136 131* 135 131* 131*  K 4.3 5.2* 4.1 4.0 4.3  CL 109 104 106 106 105  CO2 17* 13* 14* 15* 12*  GLUCOSE 171* 323* 184* 146* 169*  BUN 66* 78* 85* 90* 92*  CREATININE 2.50* 3.40* 3.59* 3.96* 3.99*  CALCIUM 7.7* 7.4* 7.1* 6.6* 6.7*   Liver Function Tests: Recent Labs  Lab 03/17/21 1003 03/18/21 0924 03/19/21 0504 03/20/21 0147 03/21/21 0115  AST 248* 804* 396* 317* 338*  ALT 222* 794* 792* 723* 847*  ALKPHOS 34* 40 44 50 76  BILITOT 0.5 0.6 0.7 0.6 1.2  PROT 5.8* 5.5* 5.5* 5.2* 5.7*  ALBUMIN 2.7* 2.6* 2.5* 2.2* 2.3*   Recent Labs  Lab 03/16/21 1300  LIPASE 32   No results for input(s): AMMONIA in the last 168 hours. Coagulation Profile: Recent Labs  Lab 03/17/21 0456 03/18/21 0630 03/19/21 0504 03/20/21 0147 03/21/21 0115  INR 1.2 1.6* 1.4* 1.6* 1.5*   CBC: Recent Labs  Lab 03/15/21 1316 03/16/21 1300 03/17/21 0456 03/18/21 0630 03/19/21 0504 03/20/21 0147 03/21/21 0115  WBC 13.8* 16.3* 21.9* 55.8* 50.6* 37.0* 32.3*  NEUTROABS 10.7* 13.7*  --   --   --   --   --   HGB 8.5* 7.1*  6.5* 6.8* 6.9* 6.1* 6.6*  HCT 26.8* 22.8* 20.9* 22.2* 21.9* 19.0* 20.6*  MCV 92.4 94.6 95.0 97.4 96.5 92.7 94.1  PLT 323 314 293 292 298 270 129*   Cardiac Enzymes: No results for input(s): CKTOTAL, CKMB, CKMBINDEX, TROPONINI in the last 168 hours. BNP: Invalid input(s): POCBNP CBG: Recent Labs  Lab 03/20/21 1201 03/20/21 1705 03/20/21 2056 03/21/21 0734 03/21/21 1158  GLUCAP 178* 184* 194* 109* 116*   HbA1C: No results for input(s): HGBA1C in the last 72 hours. Urine analysis:    Component Value Date/Time   COLORURINE AMBER (A) 03/17/2021 1208   APPEARANCEUR CLOUDY (A) 03/17/2021 1208   LABSPEC 1.015 03/17/2021 1208   PHURINE 5.0 03/17/2021 1208   GLUCOSEU 50 (A) 03/17/2021 1208    HGBUR SMALL (A) 03/17/2021 1208   BILIRUBINUR NEGATIVE 03/17/2021 [place] 03/17/2021 1208   PROTEINUR >=300 (A) 03/17/2021 1208   UROBILINOGEN 0.2 01/17/2013 1910   NITRITE NEGATIVE 03/17/2021 1208   LEUKOCYTESUR LARGE (A) 03/17/2021 1208   Sepsis Labs: @LABRCNTIP (procalcitonin:4,lacticidven:4) ) Recent Results (from the past 240 hour(s))  SARS CORONAVIRUS 2 (TAT 6-24 HRS) Nasopharyngeal Nasopharyngeal Swab     Status: None   Collection Time: 03/16/21  1:32 PM   Specimen: Nasopharyngeal Swab  Result Value Ref Range Status   SARS Coronavirus 2 NEGATIVE NEGATIVE Final    Comment: (NOTE) SARS-CoV-2 target nucleic acids are NOT DETECTED.  The SARS-CoV-2 RNA is generally detectable in upper and lower respiratory specimens during the acute phase of infection. Negative results do not preclude SARS-CoV-2 infection, do not rule out co-infections with other pathogens, and should not be used as the sole basis for treatment or other patient management decisions. Negative results must be combined with clinical observations, patient history, and epidemiological information. The expected result is Negative.  Fact Sheet for Patients: SugarRoll.be  Fact Sheet for Healthcare Providers: https://www.woods-mathews.com/  This test is not yet approved or cleared by the [place] FDA and  has been authorized for detection and/or diagnosis of SARS-CoV-2 by FDA under an Emergency Use Authorization (EUA). This EUA will remain  in effect (meaning this test can be used) for the duration of the COVID-19 declaration under Se ction 564(b)(1) of the Act, 21 U.S.C. section 360bbb-3(b)(1), unless the authorization is terminated or revoked sooner.  Performed at [facility], [place] [address], [place], [place] [address]   MRSA PCR Screening     Status: None   Collection Time: 03/16/21  5:06 PM   Specimen: Nasal Mucosa; Nasopharyngeal   Result Value Ref Range Status   MRSA by PCR NEGATIVE NEGATIVE Final    Comment:        The GeneXpert MRSA Assay (FDA approved for NASAL specimens only), is one component of a comprehensive MRSA colonization surveillance program. It is not intended to diagnose MRSA infection nor to guide or monitor treatment for MRSA infections. Performed at [facility], [address] [place], [place] [address]   Culture, Urine     Status: Abnormal   Collection Time: 03/17/21 12:08 PM   Specimen: Urine, Clean Catch  Result Value Ref Range Status   Specimen Description   Final    URINE, CLEAN CATCH Performed at [facility], [address], [place], [place] [address]    Special Requests   Final    NONE Performed at [facility], [address] [place], [place] [address]    Culture >=100,000 COLONIES/mL ESCHERICHIA COLI (A)  Final   Report Status  03/19/2021 FINAL  Final   Organism ID, Bacteria ESCHERICHIA COLI (A)  Final      Susceptibility   Escherichia coli - MIC*    AMPICILLIN 4 SENSITIVE Sensitive     CEFAZOLIN <=4 SENSITIVE Sensitive     CEFEPIME <=0.12 SENSITIVE Sensitive     CEFTRIAXONE <=0.25 SENSITIVE Sensitive     CIPROFLOXACIN <=0.25 SENSITIVE Sensitive     GENTAMICIN <=1 SENSITIVE Sensitive     IMIPENEM <=0.25 SENSITIVE Sensitive     NITROFURANTOIN <=16 SENSITIVE Sensitive     TRIMETH/SULFA <=20 SENSITIVE Sensitive     AMPICILLIN/SULBACTAM <=2 SENSITIVE Sensitive     PIP/TAZO <=4 SENSITIVE Sensitive     * >=100,000 COLONIES/mL ESCHERICHIA COLI  Culture, blood (routine x 2)     Status: None (Preliminary result)   Collection Time: 03/17/21  1:18 PM   Specimen: BLOOD  Result Value Ref Range Status   Specimen Description BLOOD LEFT ANTECUBITAL  Final   Special Requests   Final    Blood Culture adequate volume BOTTLES DRAWN AEROBIC AND ANAEROBIC   Culture   Final    NO GROWTH 4 DAYS Performed at [facility], [address], [place], [place] [address]    Report Status  PENDING  Incomplete  Culture, blood (routine x 2)     Status: None (Preliminary result)   Collection Time: 03/17/21  1:19 PM   Specimen: BLOOD LEFT HAND  Result Value Ref Range Status   Specimen Description BLOOD LEFT HAND  Final   Special Requests   Final    Blood Culture results may not be optimal due to an inadequate volume of blood received in culture bottles BOTTLES DRAWN AEROBIC ONLY   Culture   Final    NO GROWTH 4 DAYS Performed at [facility], [address], [place], [place] [address]    Report Status PENDING  Incomplete  Aerobic/Anaerobic Culture (surgical/deep wound)     Status: None (Preliminary result)   Collection Time: 03/19/21  2:51 PM   Specimen: Abdomen; Abdominal Fluid  Result Value Ref Range Status   Specimen Description ABDOMEN  Final   Special Requests GALLBLADDER  Final   Gram Stain NO WBC SEEN NO ORGANISMS SEEN   Final   Culture   Final    NO GROWTH < 24 HOURS Performed at [facility], [place] [address] [place],  [address]    Report Status PENDING  Incomplete     Scheduled Meds: . Chlorhexidine Gluconate Cloth  6 each Topical Daily  . ezetimibe  10 mg Oral Daily  . ferrous sulfate  325 mg Oral BID WC  . insulin aspart  0-15 Units Subcutaneous TID WC  . insulin aspart  0-5 Units Subcutaneous QHS  . insulin glargine  20 Units Subcutaneous Daily  . metoCLOPramide (REGLAN) injection  10 mg Intravenous Q8H  . multivitamin with minerals  1 tablet Oral Daily  . sodium bicarbonate  1,300 mg Oral TID  . sodium chloride flush  3 mL Intravenous Q12H  . sodium chloride flush  5 mL Intracatheter Q8H   Continuous Infusions: . ferumoxytol Stopped (03/20/21 2015)  . piperacillin-tazobactam (ZOSYN)  IV 3.375 g (03/21/21 0940)    Procedures/Studies: CT ABDOMEN PELVIS WO CONTRAST  Result Date: 03/17/2021 CLINICAL DATA:  Nonlocalized abdominal pain. Increased lactic acid. Lethargy. Confusion. EXAM: CT ABDOMEN AND PELVIS WITHOUT CONTRAST TECHNIQUE:  Multidetector CT imaging of the abdomen and pelvis was performed following the standard protocol without IV contrast. COMPARISON:  01/17/2013 FINDINGS:  Lower chest: Left lower lobe 9 mm pulmonary nodule on 25/4 is similar in 2014 and considered benign. Bibasilar subsegmental atelectasis. Mild cardiomegaly with pacer. Distal right coronary artery calcification. Small bilateral pleural effusions. Hepatobiliary: Small volume perihepatic ascites. Subtle hypoattenuation involving the anterior portion of segment 4 B, including on 28/2. On the order of 5.0 cm. 2.0 cm gallstone with gallbladder wall thickening up to 9 mm. No biliary duct dilatation. Pancreas: Fatty replacement involving the pancreatic head and uncinate process. Spleen: Normal in size, without focal abnormality. Adrenals/Urinary Tract: Normal adrenal glands. Mild renal cortical thinning bilaterally. Interpolar right renal exophytic 7.4 cm fluid density lesion with calcification in its wall. Trace air within the nondependent bladder including on 79/2. No ureteric or bladder calculi. Stomach/Bowel: Proximal gastric underdistention. Colonic stool burden suggests constipation. Normal terminal ileum and appendix. Normal small bowel. Vascular/Lymphatic: Advanced aortic and branch vessel atherosclerosis. Retroaortic left renal vein. No abdominopelvic adenopathy. Reproductive: Normal uterus and adnexa. Other: Small volume abdominopelvic fluid including adjacent the liver and spleen. Musculoskeletal: Mild anasarca. Remote eighth posterior left rib fracture. IMPRESSION: 1. Cholelithiasis with gallbladder wall thickening. Correlate with right upper quadrant symptoms and possibly ultrasound to exclude acute cholecystitis. 2.  Possible constipation. 3. Bilateral small pleural effusions and abdominopelvic ascites with anasarca. Question fluid overload. 4. Coronary artery atherosclerosis. Aortic Atherosclerosis (ICD10-I70.0). 5. Segment 4 B hypoattenuation could represent  prominent focal steatosis but is indeterminate. Consider nonemergent, outpatient pre and post contrast abdominal MRI. 6. Trace air within the bladder.  Correlate with instrumentation. Electronically Signed   By: Abigail Miyamoto M.D.   On: 03/17/2021 16:38   US Abdomen Complete  Result Date: 03/18/2021 CLINICAL DATA:  Cholelithiasis, acute renal failure EXAM: ABDOMEN ULTRASOUND COMPLETE COMPARISON:  CT 03/17/2021 FINDINGS: Gallbladder: The gallbladder is distended. Several shadowing gallstones are seen impacted within the gallbladder neck and there is layering sludge within the gallbladder. The gallbladder wall is markedly thickened measuring up to 14 mm in diameter and mild pericholecystic fluid is identified. The sonographic Percell Miller sign is reportedly positive. Common bile duct: Diameter: 4-5 mm in proximal diameter. Liver: No focal lesion identified. Within normal limits in parenchymal echogenicity. Portal vein is patent on color Doppler imaging with normal direction of blood flow towards the liver. IVC: No abnormality visualized. Pancreas: Visualized portion unremarkable. Spleen: Size and appearance within normal limits. Right Kidney: Length: 10.9 cm. Echogenicity within normal limits. No mass or hydronephrosis visualized. 8.1 cm simple cortical cyst identified within the upper pole. Left Kidney: Length: 11.0 cm. Echogenicity within normal limits. No mass or hydronephrosis visualized. Abdominal aorta: No aneurysm visualized, though the distal aorta is obscured by overlying bowel gas. Other findings: Small right pleural effusion is present. Mild perihepatic and perisplenic ascites is noted. IMPRESSION: Acute calculus cholecystitis. Mild ascites.  Small right pleural effusion. 8.1 cm right upper pole simple cortical cyst. This is best characterized as a Bosniak class 1 cyst and further follow-up is not required. Electronically Signed   By: Fidela Salisbury MD   On: 03/18/2021 12:51   US Abdomen Limited  Result  Date: 03/18/2021 CLINICAL DATA:  Right upper quadrant abdominal pain EXAM: ULTRASOUND ABDOMEN LIMITED RIGHT UPPER QUADRANT COMPARISON:  CT 03/17/2021 FINDINGS: Gallbladder: The gallbladder is distended, there is marked gallbladder wall thickening, and mild pericholecystic fluid identified. The gallbladder contains layering sludge and stones. Stones are seen within the gallbladder neck, however, decubitus positioning was not performed to assess for impaction. The sonographic Percell Miller sign is reportedly positive. Common bile duct: Diameter: 4  mm in proximal diameter Liver: No focal lesion identified. Within normal limits in parenchymal echogenicity. Portal vein is patent on color Doppler imaging with normal direction of blood flow towards the liver. Other: Mild perihepatic ascites is present. Small right pleural effusion is noted. 8.9 cm simple cortical cyst is noted within the upper pole of the right kidney. IMPRESSION: Acute calculus cholecystitis. Mild perihepatic ascites and small right pleural effusion. Electronically Signed   By: Fidela Salisbury MD   On: 03/18/2021 12:46   DG Chest Port 1 View  Result Date: 03/20/2021 CLINICAL DATA:  Hypoxia. EXAM: PORTABLE CHEST 1 VIEW COMPARISON:  January 17, 2013. FINDINGS: Stable cardiomediastinal silhouette. Mild central pulmonary vascular congestion is noted. No pneumothorax or pleural effusion is noted. No consolidative process is noted. 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 Bony thorax is unremarkable. IMPRESSION: Mild central pulmonary vascular congestion. Electronically Signed   By: Marijo Conception M.D.   On: 03/20/2021 08:55   ECHOCARDIOGRAM COMPLETE  Result Date: 03/18/2021    ECHOCARDIOGRAM REPORT   Patient Name:   Tracey Bryant Date of Exam: 03/18/2021 Medical Rec #:  [id number]       Height:       66.0 in Accession #:    [id number]      Weight:       180.8 lb Date of Birth:  [birth year]       BSA:          1.916 m Patient Age:    20 years         BP:           126/51 mmHg Patient Gender: F               HR:           71 bpm. Exam Location:  [facility] Procedure: 2D Echo and Intracardiac Opacification Agent Indications:    CHF-Acute Systolic N27.78                 SBE I33.9  History:        Patient has no prior history of Echocardiogram examinations.                 Stroke; Risk Factors:Non-Smoker, Diabetes and Hypertension.                 Rheumatoid Arthritis.  Sonographer:    Leavy Cella RDCS (AE) Referring Phys: [phone] SEYED A SHAHMEHDI IMPRESSIONS  1. Left ventricular ejection fraction, by estimation, is 25 to 30%. The left ventricle has normal function. The left ventricle demonstrates global hypokinesis. There is mild left ventricular hypertrophy. Left ventricular diastolic parameters are consistent with Grade II diastolic dysfunction (pseudonormalization). Elevated left atrial pressure.  2. RV poorly visualized, grossly appears enlarged with decreased systolic function. . Right ventricular systolic function was not well visualized. The right ventricular size is not well visualized.  3. Left atrial size was severely dilated.  4. The mitral valve is normal in structure. No evidence of mitral valve regurgitation. No evidence of mitral stenosis.  5. The aortic valve is tricuspid. There is mild calcification of the aortic valve. There is mild thickening of the aortic valve. Aortic valve regurgitation is not visualized. No aortic stenosis is present.  6. Mild pulmonary HTN, PASP is 36 mmHg.  7. The inferior vena cava is normal in size with greater than 50% respiratory variability, suggesting right atrial pressure of 3 mmHg. FINDINGS  Left Ventricle: Left ventricular ejection fraction, by  estimation, is 25 to 30%. The left ventricle has normal function. The left ventricle demonstrates global hypokinesis. The left ventricular internal cavity size was normal in size. There is mild left  ventricular hypertrophy. Left ventricular diastolic parameters are  consistent with Grade II diastolic dysfunction (pseudonormalization). Elevated left atrial pressure. Right Ventricle: RV poorly visualized, grossly appears enlarged with decreased systolic function. The right ventricular size is not well visualized. Right vetricular wall thickness was not well visualized. Right ventricular systolic function was not well  visualized. Left Atrium: Left atrial size was severely dilated. Right Atrium: Right atrial size was not well visualized. Pericardium: There is no evidence of pericardial effusion. Mitral Valve: The mitral valve is normal in structure. There is mild thickening of the mitral valve leaflet(s). There is mild calcification of the mitral valve leaflet(s). Mild mitral annular calcification. No evidence of mitral valve regurgitation. No evidence of mitral valve stenosis. Tricuspid Valve: The tricuspid valve is normal in structure. Tricuspid valve regurgitation is mild . No evidence of tricuspid stenosis. Aortic Valve: The aortic valve is tricuspid. There is mild calcification of the aortic valve. There is mild thickening of the aortic valve. There is mild aortic valve annular calcification. Aortic valve regurgitation is not visualized. No aortic stenosis  is present. Aortic valve mean gradient measures 2.3 mmHg. Aortic valve peak gradient measures 4.6 mmHg. Aortic valve area, by VTI measures 1.70 cm. Pulmonic Valve: The pulmonic valve was not well visualized. Pulmonic valve regurgitation is not visualized. No evidence of pulmonic stenosis. Aorta: The aortic root is normal in size and structure. Pulmonary Artery: Mild pulmonary HTN, PASP is 36 mmHg. Venous: The inferior vena cava is normal in size with greater than 50% respiratory variability, suggesting right atrial pressure of 3 mmHg. IAS/Shunts: No atrial level shunt detected by color flow Doppler.  LEFT VENTRICLE PLAX 2D LVIDd:         4.08 cm  Diastology LVIDs:         3.43 cm  LV e' medial:    4.79 cm/s LV PW:          1.34 cm  LV E/e' medial:  24.4 LV IVS:        1.08 cm  LV e' lateral:   7.83 cm/s LVOT diam:     1.80 cm  LV E/e' lateral: 14.9 LV SV:         33 LV SV Index:   17 LVOT Area:     2.54 cm  RIGHT VENTRICLE RV S prime:     9.90 cm/s TAPSE (M-mode): 1.2 cm LEFT ATRIUM             Index       RIGHT ATRIUM           Index LA diam:        4.60 cm 2.40 cm/m  RA Area:     12.90 cm LA Vol (A2C):   61.2 ml 31.95 ml/m RA Volume:   28.60 ml  14.93 ml/m LA Vol (A4C):   92.0 ml 48.02 ml/m LA Biplane Vol: 78.2 ml 40.82 ml/m  AORTIC VALVE AV Area (Vmax):    1.82 cm AV Area (Vmean):   1.50 cm AV Area (VTI):     1.70 cm AV Vmax:           107.72 cm/s AV Vmean:          71.452 cm/s AV VTI:            0.191 m  AV Peak Grad:      4.6 mmHg AV Mean Grad:      2.3 mmHg LVOT Vmax:         77.12 cm/s LVOT Vmean:        42.238 cm/s LVOT VTI:          0.128 m LVOT/AV VTI ratio: 0.67  AORTA Ao Root diam: 2.50 cm MITRAL VALVE                TRICUSPID VALVE MV Area (PHT): 4.77 cm     TR Peak grad:   33.2 mmHg MV Decel Time: 159 msec     TR Vmax:        288.00 cm/s MV E velocity: 117.00 cm/s MV A velocity: 28.40 cm/s   SHUNTS MV E/A ratio:  4.12         Systemic VTI:  0.13 m                             Systemic Diam: 1.80 cm Carlyle Dolly MD Electronically signed by Carlyle Dolly MD Signature Date/Time: 03/18/2021/2:24:49 PM    Final    CT IMAGE GUIDED DRAINAGE BY PERCUTANEOUS CATHETER  Result Date: 03/19/2021 INDICATION: Acute cholecystitis EXAM: CT -GUIDED CHOLECYSTOSTOMY TUBE PLACEMENT COMPARISON:  03/17/2021 MEDICATIONS: The patient is currently admitted to the hospital and on intravenous antibiotics. Antibiotics were administered within an appropriate time frame prior to skin puncture. ANESTHESIA/SEDATION: Moderate (conscious) sedation was employed during this procedure. A total of Versed 1.5 mg and Fentanyl 75 mcg was administered intravenously. Moderate Sedation Time: 20 minutes. The patient's level of consciousness and  vital signs were monitored continuously by radiology nursing throughout the procedure under my direct supervision. CONTRAST:  None. FLUOROSCOPY TIME:  None. COMPLICATIONS: None immediate. PROCEDURE: Informed written consent was obtained from the patient after a discussion of the risks, benefits and alternatives to treatment. Questions regarding the procedure were encouraged and answered. A timeout was performed prior to the initiation of the procedure. The right upper abdominal quadrant was prepped and draped in the usual sterile fashion, and a sterile drape was applied covering the operative field. Maximum barrier sterile technique with sterile gowns and gloves were used for the procedure. A timeout was performed prior to the initiation of the procedure. Local anesthesia was provided with 1% lidocaine with epinephrine. Limited upper abdominal CT demonstrates a markedly dilated gallbladder. Utilizing a transhepatic approach, an 18 gauge trocar needle was advanced into the gallbladder under intermittent CT guidance. An Amplatz wire was inserted and coiled within the gallbladder. Appropriate position was confirmed with limited CT. Serial dilation was performed and ultimately a 10.2-French Cook multipurpose drainage tube was advanced into the gallbladder infundibulum, coiled and locked. Limited upper abdominal CT was then obtained to confirm adequate positioning. Bile was aspirated. The catheter was secured to the skin with suture, connected to a drainage bag and a dressing was placed. The patient tolerated the procedure well without immediate post procedural complication. IMPRESSION: Successful CT guided placement of a 10.2 French cholecystostomy tube. PLAN: Follow-up with cholecystostomy tube check and change in 2 months. Ruthann Cancer, MD [facility] Radiology Electronically Signed   By: Ruthann Cancer MD   On: 03/19/2021 16:39    Phillips Climes, MD  [facility]  If 7PM-7AM, please contact night-coverage [email or web address] 03/21/2021, 1:12 PM   LOS: 5 days

## 2021-03-21 NOTE — TOC Progression Note (Signed)
Transition of Care [facility]) - Progression Note    Patient Details  Name: Tracey Bryant MRN: [id number] Date of Birth: 1951/12/06  Transition of Care [facility]) CM/SW [place], [place] Phone Number: 03/21/2021, 9:54 AM  Clinical Narrative:     CSW followed up with patients son [name] who says he spoke with patient and plan is for patient to go home with sisters. Patients sisters will be able to provide 24/7 supervision for patient. [name] states plan is for patient to eventually transition back to live with him. [name] is interested in home health services for patient. CSW let [name] know she will inform case manager that he is interested in home health services. CSW explained to patients son that case manager will be able to answer questions he has about home health, to help him decide if he wants home health services to follow patient at home with sisters. Patients son thanked CSW. CSW informed case Freight forwarder. No further questions reported at this time. CSW to continue to follow and assist with discharge planning needs.    Expected Discharge Plan: [place] Barriers to Discharge: Continued Medical Work up  Expected Discharge Plan and Services Expected Discharge Plan: [place] In-house Referral: Clinical Social Work     Living arrangements for the past 2 months: Single Family Home                                       Social Determinants of Health (SDOH) Interventions    Readmission Risk Interventions Readmission Risk Prevention Plan 03/19/2021 03/17/2021  Transportation Screening - Complete  PCP or Specialist Appt within 5-7 Days Complete -  Home Care Screening Complete -  Medication Review (RN CM) - Complete  Some recent data might be hidden

## 2021-03-21 NOTE — Consult Note (Signed)
UNASSIGNED CONSULT  Reason for Consult: Abnormal liver enzymes. Referring Physician: [facility]  Tracey Bryant HPI: This is a 70 year old female with a PMH of CHF with an EF of 25-30%, afib on Eliquis, HTN, CVA, and DM originally admitted to [facility] with complaints of nausea, vomiting, and weakness.  Further work up showed that she had an acute cholecystitis.  She has a large gallstone measuring 2 cm and the gallbladder wall was thickened up to 9 mm.  She was deemed to ill to undergo lap chole and she was transferred to [facility] for a percutaneous drain.  The drain was successfully placed and her liver enzymes were noted to be decreasing, but then today there was an increase in her liver enzymes.  Clinically there does not appear to be any significant change, but her BNP worsened from 1504 up to 1618.  Her INR is mildly elevated at 1.5.  Past Medical History:  Diagnosis Date  . Arthritis   . Cardiomyopathy ([place])   . Diabetes mellitus without complication ([place])   . Hypertension   . Stroke [facility])     Past Surgical History:  Procedure Laterality Date  . breast tumor    . PACEMAKER IMPLANT    . TONSILLECTOMY      Family History  Problem Relation Age of Onset  . Bryant Mother   . Diabetes Mother   . Heart disease Father     Social History:  reports that she has never smoked. She has never used smokeless tobacco. She reports that she does not drink alcohol and does not use drugs.  Allergies: No Known Allergies  Medications:  Scheduled: . Chlorhexidine Gluconate Cloth  6 each Topical Daily  . ezetimibe  10 mg Oral Daily  . feeding supplement (NEPRO CARB STEADY)  237 mL Oral TID BM  . ferrous sulfate  325 mg Oral BID WC  . insulin aspart  0-15 Units Subcutaneous TID WC  . insulin aspart  0-5 Units Subcutaneous QHS  . insulin glargine  20 Units Subcutaneous Daily  . metoCLOPramide (REGLAN) injection  10 mg Intravenous Q8H  . multivitamin with minerals  1 tablet  Oral Daily  . sodium bicarbonate  1,300 mg Oral TID  . sodium chloride flush  3 mL Intravenous Q12H  . sodium chloride flush  5 mL Intracatheter Q8H   Continuous: . ferumoxytol Stopped (03/20/21 2015)  . piperacillin-tazobactam (ZOSYN)  IV 3.375 g (03/21/21 0940)    Results for orders placed or performed during the hospital encounter of 03/16/21 (from the past 24 hour(s))  Glucose, capillary     Status: Abnormal   Collection Time: 03/20/21  5:05 PM  Result Value Ref Range   Glucose-Capillary 184 (H) 70 - 99 mg/dL  Glucose, capillary     Status: Abnormal   Collection Time: 03/20/21  8:56 PM  Result Value Ref Range   Glucose-Capillary 194 (H) 70 - 99 mg/dL  CBC     Status: Abnormal   Collection Time: 03/21/21  1:15 AM  Result Value Ref Range   WBC 32.3 (H) 4.0 - 10.5 K/uL   RBC 2.19 (L) 3.87 - 5.11 MIL/uL   Hemoglobin 6.6 (LL) 12.0 - 15.0 g/dL   HCT 20.6 (L) 36.0 - 46.0 %   MCV 94.1 80.0 - 100.0 fL   MCH 30.1 26.0 - 34.0 pg   MCHC 32.0 30.0 - 36.0 g/dL   RDW 17.4 (H) 11.5 - 15.5 %   Platelets 129 (L) 150 -  400 K/uL   nRBC 2.8 (H) 0.0 - 0.2 %  Protime-INR     Status: Abnormal   Collection Time: 03/21/21  1:15 AM  Result Value Ref Range   Prothrombin Time 17.7 (H) 11.4 - 15.2 seconds   INR 1.5 (H) 0.8 - 1.2  Comprehensive metabolic panel     Status: Abnormal   Collection Time: 03/21/21  1:15 AM  Result Value Ref Range   Sodium 131 (L) 135 - 145 mmol/L   Potassium 4.3 3.5 - 5.1 mmol/L   Chloride 105 98 - 111 mmol/L   CO2 12 (L) 22 - 32 mmol/L   Glucose, Bld 169 (H) 70 - 99 mg/dL   BUN 92 (H) 8 - 23 mg/dL   Creatinine, Ser 3.99 (H) 0.44 - 1.00 mg/dL   Calcium 6.7 (L) 8.9 - 10.3 mg/dL   Total Protein 5.7 (L) 6.5 - 8.1 g/dL   Albumin 2.3 (L) 3.5 - 5.0 g/dL   AST 338 (H) 15 - 41 U/L   ALT 847 (H) 0 - 44 U/L   Alkaline Phosphatase 76 38 - 126 U/L   Total Bilirubin 1.2 0.3 - 1.2 mg/dL   GFR, Estimated 12 (L) >60 mL/min   Anion gap 14 5 - 15  Glucose, capillary     Status:  Abnormal   Collection Time: 03/21/21  7:34 AM  Result Value Ref Range   Glucose-Capillary 109 (H) 70 - 99 mg/dL  Brain natriuretic peptide     Status: Abnormal   Collection Time: 03/21/21  8:05 AM  Result Value Ref Range   B Natriuretic Peptide 1,618.1 (H) 0.0 - 100.0 pg/mL  Lactic acid, plasma     Status: None   Collection Time: 03/21/21  8:05 AM  Result Value Ref Range   Lactic Acid, Venous 1.4 0.5 - 1.9 mmol/L  Glucose, capillary     Status: Abnormal   Collection Time: 03/21/21 11:58 AM  Result Value Ref Range   Glucose-Capillary 116 (H) 70 - 99 mg/dL     DG Chest Port 1 View  Result Date: 03/20/2021 CLINICAL DATA:  Hypoxia. EXAM: PORTABLE CHEST 1 VIEW COMPARISON:  January 17, 2013. FINDINGS: Stable cardiomediastinal silhouette. Mild central pulmonary vascular congestion is noted. No pneumothorax or pleural effusion is noted. No consolidative process is noted. 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 Bony thorax is unremarkable. IMPRESSION: Mild central pulmonary vascular congestion. Electronically Signed   By: Tracey Bryant M.D.   On: 03/20/2021 08:55   CT IMAGE GUIDED DRAINAGE BY PERCUTANEOUS CATHETER  Result Date: 03/19/2021 INDICATION: Acute cholecystitis EXAM: CT -GUIDED CHOLECYSTOSTOMY TUBE PLACEMENT COMPARISON:  03/17/2021 MEDICATIONS: The patient is currently admitted to the hospital and on intravenous antibiotics. Antibiotics were administered within an appropriate time frame prior to skin puncture. ANESTHESIA/SEDATION: Moderate (conscious) sedation was employed during this procedure. A total of Versed 1.5 mg and Fentanyl 75 mcg was administered intravenously. Moderate Sedation Time: 20 minutes. The patient's level of consciousness and vital signs were monitored continuously by radiology nursing throughout the procedure under my direct supervision. CONTRAST:  None. FLUOROSCOPY TIME:  None. COMPLICATIONS: None immediate. PROCEDURE: Informed written consent was obtained  from the patient after a discussion of the risks, benefits and alternatives to treatment. Questions regarding the procedure were encouraged and answered. A timeout was performed prior to the initiation of the procedure. The right upper abdominal quadrant was prepped and draped in the usual sterile fashion, and a sterile drape was applied covering the operative field. Maximum  barrier sterile technique with sterile gowns and gloves were used for the procedure. A timeout was performed prior to the initiation of the procedure. Local anesthesia was provided with 1% lidocaine with epinephrine. Limited upper abdominal CT demonstrates a markedly dilated gallbladder. Utilizing a transhepatic approach, an 18 gauge trocar needle was advanced into the gallbladder under intermittent CT guidance. An Amplatz wire was inserted and coiled within the gallbladder. Appropriate position was confirmed with limited CT. Serial dilation was performed and ultimately a 10.2-French Cook multipurpose drainage tube was advanced into the gallbladder infundibulum, coiled and locked. Limited upper abdominal CT was then obtained to confirm adequate positioning. Bile was aspirated. The catheter was secured to the skin with suture, connected to a drainage bag and a dressing was placed. The patient tolerated the procedure well without immediate post procedural complication. IMPRESSION: Successful CT guided placement of a 10.2 French cholecystostomy tube. PLAN: Follow-up with cholecystostomy tube check and change in 2 months. Tracey Cancer, MD [facility] Radiology Electronically Signed   By: Tracey Cancer MD   On: 03/19/2021 16:39    ROS:  As stated above in the HPI otherwise negative.  Blood pressure (!) 123/52, pulse 72, temperature 98 F (36.7 C), temperature source Oral, resp. rate 18, height 5\' 6"  (1.676 m), weight 92.3 kg, SpO2 100 %.    PE: Gen: Weak and uncomfortable appearing Lungs: CTA  Bilaterally CV: RRR without M/G/R ABD: Soft, tender at the percutaneous site, +BS Ext: No C/C/E  Assessment/Plan: 1) Elevated liver enzymes. 2) Cholecystitis. 3) Anemia. 4) AKI on CKD. 5) CHF.   This is a complex picture.  The improvement and now worsening of her liver enzymes is not known.  Her BNP did increase, but these values are higher than anticipated and there is no cholestatic pattern.  Some rise in the AP can be seen with CHF.  Review of her most recent blood pressures does not show any significant hypotension to suggest an ischemic hepatopathy.  The abnormal liver enzymes can be reflective of sepsis also.  Plan: 1) Continue with the current care. 2) Monitor the liver enzymes.   3) If the liver enzymes continue to increase repeat imaging may be warranted.   Tracey Bryant D 03/21/2021, 12:56 PM

## 2021-03-21 NOTE — Progress Notes (Signed)
[facility] Progress Note    Assessment/ Plan:   AKI (stable), nonoliguric: Likely secondary to ATN from sepsis/hypotension and on-going anemia, there is a concern for possible cardiorenal however she is not significantly volume up on exam. Despite fluids, there has not been any improvement in her AKI. Baseline Cr around 1 -Cr stable today, possible plateau phase? -supportive treatment at this time. No need for fluids -No renal obstruction on abdominal ultrasound 3/23 -repeat UA ordered, hyaline casts on 3/22 noted -need strict I/O -Avoid nephrotoxic medications including NSAIDs and iodinated intravenous contrast exposure unless the latter is absolutely indicated.  Preferred narcotic agents for pain control are hydromorphone, fentanyl, and methadone. Morphine should not be used. Avoid Baclofen and avoid oral sodium phosphate and magnesium citrate based laxatives / bowel preps. Continue strict Input and Output monitoring. Will monitor the patient closely with you and intervene or adjust therapy as indicated by changes in clinical status/labs   Acute cholecystitis, status post cholecystostomy tube on 3/25 with IR.  She is a poor surgical candidate, antibiotics per primary  Severe sepsis secondary to UTI and cholecystitis -Antibiotics per primary, no need for further fluids at this point  Chronic systolic and diastolic CHF -EF 25 to 96% (03/18/2021) -Not a candidate for cath due to her AKI -Would recommend having a low threshold on restarting diuretics if needed  Acute blood loss anemia -Eliquis on hold.  She is a Sales promotion account executive Witness, treating conservatively. Hgb 6.1 today  Metabolic acidosis, lactic acidosis -repeat lactate  Hypertension: -Hold home antihypertensive medications if she is on any.  Would also recommend stopping her PRN's if she is on any  Hyponatremia, mild. Correction: yesterday's consult was dictated as hypernatremia which is not the case.  Suspecting hyopNa is multifactorial: pain, hypervolemia, and AKI. Monitor for now. If lower then will work her up with urine and osm studies. Na stable this am  Diabetes Mellitus Type 2 with Hyperglycemia -mgmt per primary  Transaminitis -secondary to choleycystitis? Mgmt, workup per primary  Recommendations conveyed to the primary service.  Subjective:   No acute events. She is happy that her appetite is better and she is urinating more. She still has abd pain. uop 0.7L   Objective:   BP (!) 123/52 (BP Location: Right Wrist)   Pulse 72   Temp 98 F (36.7 C) (Oral)   Resp 18   Ht 5\' 6"  (1.676 m)   Wt 92.3 kg   SpO2 100%   BMI 32.84 kg/m   Intake/Output Summary (Last 24 hours) at 03/21/2021 1448 Last data filed at 03/21/2021 1147 Gross per 24 hour  Intake 1130.69 ml  Output 1280 ml  Net -149.31 ml   Weight change: -4.2 kg  Physical Exam: [id number] ill appearing CVS:s1s2 Resp:normal iwob Abd:ruq drain in place Ext:no edema Neuro: speech clear and coherent, moves ext spontaneously  Imaging: DG Chest Port 1 View  Result Date: 03/20/2021 CLINICAL DATA:  Hypoxia. EXAM: PORTABLE CHEST 1 VIEW COMPARISON:  January 17, 2013. FINDINGS: Stable cardiomediastinal silhouette. Mild central pulmonary vascular congestion is noted. No pneumothorax or pleural effusion is noted. No consolidative process is noted. 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 Bony thorax is unremarkable. IMPRESSION: Mild central pulmonary vascular congestion. Electronically Signed   By: Marijo Conception M.D.   On: 03/20/2021 08:55   CT IMAGE GUIDED DRAINAGE BY PERCUTANEOUS CATHETER  Result Date: 03/19/2021 INDICATION: Acute cholecystitis EXAM: CT -GUIDED CHOLECYSTOSTOMY TUBE PLACEMENT COMPARISON:  03/17/2021 MEDICATIONS: The patient is currently admitted to  the hospital and on intravenous antibiotics. Antibiotics were administered within an appropriate time frame prior to skin puncture.  ANESTHESIA/SEDATION: Moderate (conscious) sedation was employed during this procedure. A total of Versed 1.5 mg and Fentanyl 75 mcg was administered intravenously. Moderate Sedation Time: 20 minutes. The patient's level of consciousness and vital signs were monitored continuously by radiology nursing throughout the procedure under my direct supervision. CONTRAST:  None. FLUOROSCOPY TIME:  None. COMPLICATIONS: None immediate. PROCEDURE: Informed written consent was obtained from the patient after a discussion of the risks, benefits and alternatives to treatment. Questions regarding the procedure were encouraged and answered. A timeout was performed prior to the initiation of the procedure. The right upper abdominal quadrant was prepped and draped in the usual sterile fashion, and a sterile drape was applied covering the operative field. Maximum barrier sterile technique with sterile gowns and gloves were used for the procedure. A timeout was performed prior to the initiation of the procedure. Local anesthesia was provided with 1% lidocaine with epinephrine. Limited upper abdominal CT demonstrates a markedly dilated gallbladder. Utilizing a transhepatic approach, an 18 gauge trocar needle was advanced into the gallbladder under intermittent CT guidance. An Amplatz wire was inserted and coiled within the gallbladder. Appropriate position was confirmed with limited CT. Serial dilation was performed and ultimately a 10.2-French Cook multipurpose drainage tube was advanced into the gallbladder infundibulum, coiled and locked. Limited upper abdominal CT was then obtained to confirm adequate positioning. Bile was aspirated. The catheter was secured to the skin with suture, connected to a drainage bag and a dressing was placed. The patient tolerated the procedure well without immediate post procedural complication. IMPRESSION: Successful CT guided placement of a 10.2 French cholecystostomy tube. PLAN: Follow-up with  cholecystostomy tube check and change in 2 months. Ruthann Cancer, MD [facility] Radiology Electronically Signed   By: Ruthann Cancer MD   On: 03/19/2021 16:39    Labs: BMET Recent Labs  Lab 03/17/21 0221 03/17/21 0456 03/17/21 1003 03/18/21 0630 03/19/21 0504 03/20/21 0147 03/21/21 0115  NA 135 137 136 131* 135 131* 131*  K 4.8 4.7 4.3 5.2* 4.1 4.0 4.3  CL 110 110 109 104 106 106 105  CO2 17* 17* 17* 13* 14* 15* 12*  GLUCOSE 204* 184* 171* 323* 184* 146* 169*  BUN 62* 64* 66* 78* 85* 90* 92*  CREATININE 2.40* 2.41* 2.50* 3.40* 3.59* 3.96* 3.99*  CALCIUM 7.7* 7.8* 7.7* 7.4* 7.1* 6.6* 6.7*   CBC Recent Labs  Lab 03/15/21 1316 03/16/21 1300 03/17/21 0456 03/18/21 0630 03/19/21 0504 03/20/21 0147 03/21/21 0115  WBC 13.8* 16.3*   < > 55.8* 50.6* 37.0* 32.3*  NEUTROABS 10.7* 13.7*  --   --   --   --   --   HGB 8.5* 7.1*   < > 6.8* 6.9* 6.1* 6.6*  HCT 26.8* 22.8*   < > 22.2* 21.9* 19.0* 20.6*  MCV 92.4 94.6   < > 97.4 96.5 92.7 94.1  PLT 323 314   < > 292 298 270 129*   < > = values in this interval not displayed.    Medications:    . Chlorhexidine Gluconate Cloth  6 each Topical Daily  . ezetimibe  10 mg Oral Daily  . feeding supplement (NEPRO CARB STEADY)  237 mL Oral TID BM  . ferrous sulfate  325 mg Oral BID WC  . insulin aspart  0-15 Units Subcutaneous TID WC  . insulin aspart  0-5 Units  Subcutaneous QHS  . insulin glargine  20 Units Subcutaneous Daily  . metoCLOPramide (REGLAN) injection  10 mg Intravenous Q8H  . multivitamin with minerals  1 tablet Oral Daily  . sodium bicarbonate  1,300 mg Oral TID  . sodium chloride flush  3 mL Intravenous Q12H  . sodium chloride flush  5 mL Intracatheter Q8H      Gean Quint, MD [facility] 03/21/2021, 2:48 PM

## 2021-03-22 DIAGNOSIS — K810 Acute cholecystitis: Secondary | ICD-10-CM | POA: Diagnosis not present

## 2021-03-22 DIAGNOSIS — E0810 Diabetes mellitus due to underlying condition with ketoacidosis without coma: Secondary | ICD-10-CM | POA: Diagnosis not present

## 2021-03-22 DIAGNOSIS — D62 Acute posthemorrhagic anemia: Secondary | ICD-10-CM | POA: Diagnosis not present

## 2021-03-22 DIAGNOSIS — N390 Urinary tract infection, site not specified: Secondary | ICD-10-CM | POA: Diagnosis not present

## 2021-03-22 LAB — CULTURE, BLOOD (ROUTINE X 2)
Culture: NO GROWTH
Culture: NO GROWTH
Special Requests: ADEQUATE

## 2021-03-22 LAB — CBC
HCT: 20 % — ABNORMAL LOW (ref 36.0–46.0)
Hemoglobin: 6.2 g/dL — CL (ref 12.0–15.0)
MCH: 28.8 pg (ref 26.0–34.0)
MCHC: 31 g/dL (ref 30.0–36.0)
MCV: 93 fL (ref 80.0–100.0)
Platelets: 266 10*3/uL (ref 150–400)
RBC: 2.15 MIL/uL — ABNORMAL LOW (ref 3.87–5.11)
RDW: 17.4 % — ABNORMAL HIGH (ref 11.5–15.5)
WBC: 22.6 10*3/uL — ABNORMAL HIGH (ref 4.0–10.5)
nRBC: 3.1 % — ABNORMAL HIGH (ref 0.0–0.2)

## 2021-03-22 LAB — GLUCOSE, CAPILLARY
Glucose-Capillary: 206 mg/dL — ABNORMAL HIGH (ref 70–99)
Glucose-Capillary: 231 mg/dL — ABNORMAL HIGH (ref 70–99)
Glucose-Capillary: 213 mg/dL — ABNORMAL HIGH (ref 70–99)
Glucose-Capillary: 202 mg/dL — ABNORMAL HIGH (ref 70–99)

## 2021-03-22 LAB — COMPREHENSIVE METABOLIC PANEL
ALT: 652 U/L — ABNORMAL HIGH (ref 0–44)
AST: 193 U/L — ABNORMAL HIGH (ref 15–41)
Albumin: 2 g/dL — ABNORMAL LOW (ref 3.5–5.0)
Alkaline Phosphatase: 59 U/L (ref 38–126)
Anion gap: 13 (ref 5–15)
BUN: 88 mg/dL — ABNORMAL HIGH (ref 8–23)
CO2: 15 mmol/L — ABNORMAL LOW (ref 22–32)
Calcium: 6.6 mg/dL — ABNORMAL LOW (ref 8.9–10.3)
Chloride: 103 mmol/L (ref 98–111)
Creatinine, Ser: 3.83 mg/dL — ABNORMAL HIGH (ref 0.44–1.00)
GFR, Estimated: 12 mL/min — ABNORMAL LOW (ref >60)
Glucose, Bld: 256 mg/dL — ABNORMAL HIGH (ref 70–99)
Potassium: 3.4 mmol/L — ABNORMAL LOW (ref 3.5–5.1)
Sodium: 131 mmol/L — ABNORMAL LOW (ref 135–145)
Total Bilirubin: 0.7 mg/dL (ref 0.3–1.2)
Total Protein: 5.4 g/dL — ABNORMAL LOW (ref 6.5–8.1)

## 2021-03-22 MED ORDER — POTASSIUM CHLORIDE CRYS ER 20 MEQ PO TBCR
20.0000 meq | EXTENDED_RELEASE_TABLET | Freq: Once | ORAL | Status: AC
  Administered 2021-03-22: 20 meq via ORAL
  Filled 2021-03-22: qty 1

## 2021-03-22 NOTE — Progress Notes (Signed)
Subjective: Feeling a little constipated, but overall better than yesterday.  Objective: Vital signs in last 24 hours: Temp:  [97.7 F (36.5 C)-98 F (36.7 C)] 97.7 F (36.5 C) (03/27 0458) Pulse Rate:  [66-72] 66 (03/27 0458) Resp:  [17-19] 17 (03/27 0458) BP: (120-143)/(52-63) 135/63 (03/27 0458) SpO2:  [97 %-100 %] 98 % (03/27 0458) Weight:  [91.9 kg] 91.9 kg (03/27 0600) Last BM Date: 03/22/21  Intake/Output from previous day: 03/26 0701 - 03/27 0700 In: 496 [P.O.:480; I.V.:6] Out: 1665 [Urine:1445; Drains:220] Intake/Output this shift: No intake/output data recorded.  General appearance: alert, pale and weak Resp: clear to auscultation bilaterally Cardio: regular rate and rhythm GI: soft, non-tender; bowel sounds normal; no masses,  no organomegaly Extremities: extremities normal, atraumatic, no cyanosis or edema  Lab Results: Recent Labs    03/20/21 0147 03/21/21 0115 03/22/21 0219  WBC 37.0* 32.3* 22.6*  HGB 6.1* 6.6* 6.2*  HCT 19.0* 20.6* 20.0*  PLT 270 129* 266   BMET Recent Labs    03/20/21 0147 03/21/21 0115 03/22/21 0219  NA 131* 131* 131*  K 4.0 4.3 3.4*  CL 106 105 103  CO2 15* 12* 15*  GLUCOSE 146* 169* 256*  BUN 90* 92* 88*  CREATININE 3.96* 3.99* 3.83*  CALCIUM 6.6* 6.7* 6.6*   LFT Recent Labs    03/20/21 0147 03/21/21 0115 03/22/21 0219  PROT 5.2*   < > 5.4*  ALBUMIN 2.2*   < > 2.0*  AST 317*   < > 193*  ALT 723*   < > 652*  ALKPHOS 50   < > 59  BILITOT 0.6   < > 0.7  BILIDIR 0.2  --   --   IBILI 0.4  --   --    < > = values in this interval not displayed.   PT/INR Recent Labs    03/20/21 0147 03/21/21 0115  LABPROT 18.9* 17.7*  INR 1.6* 1.5*   Hepatitis Panel Recent Labs    03/21/21 0805  HEPBSAG NON REACTIVE  HCVAB NON REACTIVE  HEPAIGM NON REACTIVE  HEPBIGM NON REACTIVE   C-Diff No results for input(s): CDIFFTOX in the last 72 hours. Fecal Lactopherrin No results for input(s): FECLLACTOFRN in the last 72  hours.  Studies/Results: DG Chest Port 1 View  Result Date: 03/20/2021 CLINICAL DATA:  Hypoxia. EXAM: PORTABLE CHEST 1 VIEW COMPARISON:  January 17, 2013. FINDINGS: Stable cardiomediastinal silhouette. Mild central pulmonary vascular congestion is noted. No pneumothorax or pleural effusion is noted. No consolidative process is noted. 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 Bony thorax is unremarkable. IMPRESSION: Mild central pulmonary vascular congestion. Electronically Signed   By: Marijo Conception M.D.   On: 03/20/2021 08:55    Medications:  Scheduled: . Chlorhexidine Gluconate Cloth  6 each Topical Daily  . ezetimibe  10 mg Oral Daily  . feeding supplement (NEPRO CARB STEADY)  237 mL Oral TID BM  . ferrous sulfate  325 mg Oral BID WC  . insulin aspart  0-15 Units Subcutaneous TID WC  . insulin aspart  0-5 Units Subcutaneous QHS  . insulin glargine  20 Units Subcutaneous Daily  . metoCLOPramide (REGLAN) injection  10 mg Intravenous Q8H  . multivitamin with minerals  1 tablet Oral Daily  . sodium bicarbonate  1,300 mg Oral TID  . sodium chloride flush  3 mL Intravenous Q12H  . sodium chloride flush  5 mL Intracatheter Q8H   Continuous: . ferumoxytol Stopped (03/20/21 2015)  . piperacillin-tazobactam (ZOSYN)  IV 3.375 g (03/21/21 2228)    Assessment/Plan: 1) Abnormal liver enzymes - improved. 2) Acute cholecystitis s/p percutaneous drainage. 3) DM. 4) CHF.   From the hepatic standpoint she is well.  Her liver enzymes are now down trending.  It may be that she had a transient rise as a result of some heart failure, but that is not for certain.  Plan: 1) Continue with supportive care. 2) No new GI recommendations.  Signing off.    LOS: 6 days   HUNG,PATRICK D 03/22/2021, 7:44 AM

## 2021-03-22 NOTE — TOC Progression Note (Signed)
Transition of Care [facility]) - Progression Note    Patient Details  Name: Tracey Bryant MRN: [id number] Date of Birth: November 12, 1951  [facility] CM/SW Contact  Carles Collet, RN Phone Number: 03/22/2021, 1:18 PM  Clinical Narrative:    Damaris Schooner w patient's son Tracey Bryant to discuss [facility] for DC. He confirms that the patient will DC to her sister's house, in [place] at [place] to rehabilitate as he is out of the house during the day for work. Ultimately the plan will be for her to return to his house. Tracey Bryant could not recite his Aunt's address in [place] and will provide it tomorrow. Discuss what HH is and is not. He is going to speak to his Aunt's prior to consenting for [facility], as the services would be rendered in her home, not his.   TOC will need to follow up with him Monday     Expected Discharge Plan: [employer] Barriers to Discharge: Continued Medical Work up  Expected Discharge Plan and Services Expected Discharge Plan: [place] In-house Referral: Clinical Social Work     Living arrangements for the past 2 months: Single Family Home                                       Social Determinants of Health (SDOH) Interventions    Readmission Risk Interventions Readmission Risk Prevention Plan 03/19/2021 03/17/2021  Transportation Screening - Complete  PCP or Specialist Appt within 5-7 Days Complete -  Home Care Screening Complete -  Medication Review (RN CM) - Complete  Some recent data might be hidden

## 2021-03-22 NOTE — Progress Notes (Signed)
[facility] Progress Note    Assessment/ Plan:   AKI (improving), nonoliguric: Likely secondary to ATN from sepsis/hypotension and on-going anemia. There is was concern for possible cardiorenal however she is not significantly volume up on exam. Despite fluids, there had not been any improvement in her AKI. Baseline Cr around 1 -Cr stable/slightly improved today. With her increase in urine output, I am suspecting she is heading towards the recovery phase of ATN -supportive treatment at this time -No renal obstruction on abdominal ultrasound 3/23 -repeat UA ordered--still pending, hyaline casts on 3/22 noted -need strict I/O -Avoid nephrotoxic medications including NSAIDs and iodinated intravenous contrast exposure unless the latter is absolutely indicated.  Preferred narcotic agents for pain control are hydromorphone, fentanyl, and methadone. Morphine should not be used. Avoid Baclofen and avoid oral sodium phosphate and magnesium citrate based laxatives / bowel preps. Continue strict Input and Output monitoring. Will monitor the patient closely with you and intervene or adjust therapy as indicated by changes in clinical status/labs   Acute cholecystitis, status post cholecystostomy tube on 3/25 with IR.  She is a poor surgical candidate, antibiotics per primary  Severe sepsis secondary to UTI and cholecystitis -Antibiotics per primary, no need for further fluids at this point  Chronic systolic and diastolic CHF -EF 25 to 97% (03/18/2021) -Not a candidate for cath due to her AKI -Would recommend having a low threshold on restarting diuretics if needed  Acute blood loss anemia -Eliquis on hold.  She is a Sales promotion account executive Witness, treating conservatively. Hgb 6.2 today  Metabolic acidosis, lactic acidosis -repeat lactate  -nahco3 1300mg  tid  Hypertension: -Hold home antihypertensive medications if she is on any.  Would also recommend stopping her PRN's if she is on  any  Hyponatremia, mild. Na stable this am  Diabetes Mellitus Type 2 with Hyperglycemia -mgmt per primary  Transaminitis -secondary to choleycystitis? Mgmt, workup per primary. Gi on board. lft's improving  Recommendations conveyed to the primary service. Will sign off from a nephrology perspective for now. At this time, would just monitor labs.  Thank you for allowing [place] to participate in the care of this patient.  Please call with any questions or concerns.   Gean Quint, MD [facility]  Subjective:   No acute events. She reports that she is urinating more. Has been keeping up with fluids. Other than some abd pain which is has been an on-going issue, she does not offer any other complaints at the moment   Objective:   BP 125/74 (BP Location: Left Arm)   Pulse 67   Temp 97.8 F (36.6 C) (Oral)   Resp 17   Ht 5\' 6"  (1.676 m)   Wt 91.9 kg   SpO2 96%   BMI 32.70 kg/m   Intake/Output Summary (Last 24 hours) at 03/22/2021 1333 Last data filed at 03/22/2021 9892 Gross per 24 hour  Intake 16 ml  Output 955 ml  Net -939 ml   Weight change: -0.4 kg  Physical Exam: [id number] ill appearing, laying flat in bed CVS:s1s2 Resp:normal iwob Abd:ruq drain in place Ext:no edema Neuro: speech clear and coherent, moves ext spontaneously  Imaging: No results found.  Labs: BMET Recent Labs  Lab 03/17/21 0456 03/17/21 1003 03/18/21 0630 03/19/21 0504 03/20/21 0147 03/21/21 0115 03/22/21 0219  NA 137 136 131* 135 131* 131* 131*  K 4.7 4.3 5.2* 4.1 4.0 4.3 3.4*  CL 110 109 104 106 106 105 103  CO2 17* 17* 13* 14* 15* 12*  15*  GLUCOSE 184* 171* 323* 184* 146* 169* 256*  BUN 64* 66* 78* 85* 90* 92* 88*  CREATININE 2.41* 2.50* 3.40* 3.59* 3.96* 3.99* 3.83*  CALCIUM 7.8* 7.7* 7.4* 7.1* 6.6* 6.7* 6.6*   CBC Recent Labs  Lab 03/16/21 1300 03/17/21 0456 03/19/21 0504 03/20/21 0147 03/21/21 0115 03/22/21 0219  WBC 16.3*   < > 50.6* 37.0* 32.3*  22.6*  NEUTROABS 13.7*  --   --   --   --   --   HGB 7.1*   < > 6.9* 6.1* 6.6* 6.2*  HCT 22.8*   < > 21.9* 19.0* 20.6* 20.0*  MCV 94.6   < > 96.5 92.7 94.1 93.0  PLT 314   < > 298 270 129* 266   < > = values in this interval not displayed.    Medications:    . Chlorhexidine Gluconate Cloth  6 each Topical Daily  . ezetimibe  10 mg Oral Daily  . feeding supplement (NEPRO CARB STEADY)  237 mL Oral TID BM  . insulin aspart  0-15 Units Subcutaneous TID WC  . insulin aspart  0-5 Units Subcutaneous QHS  . insulin glargine  20 Units Subcutaneous Daily  . metoCLOPramide (REGLAN) injection  10 mg Intravenous Q8H  . multivitamin with minerals  1 tablet Oral Daily  . sodium bicarbonate  1,300 mg Oral TID  . sodium chloride flush  3 mL Intravenous Q12H  . sodium chloride flush  5 mL Intracatheter Q8H      Gean Quint, MD [facility] 03/22/2021, 1:33 PM

## 2021-03-22 NOTE — Progress Notes (Addendum)
Paged Dr. Myna Hidalgo ([facility]) -- Pt's Hgb is 6.2. Dropped from 6.6 yest and 6.1 the 2 days ago. She is a Jehov[employer] Witness. No active bleeding she is asymptomatic. A&O x4.  Waiting for further instructions.   Dr. Myna Hidalgo called back... no further instructions/orders given at the moment since pt is asymptomatic. Will continue to monitor pt closely. Pt will have another Hgb checked during day shift.

## 2021-03-22 NOTE — Progress Notes (Signed)
Inpatient Diabetes Program Recommendations  AACE/ADA: New Consensus Statement on Inpatient Glycemic Control (2015)  Target Ranges:  Prepandial:   less than 140 mg/dL      Peak postprandial:   less than 180 mg/dL (1-2 hours)      Critically ill patients:  140 - 180 mg/dL   Lab Results  Component Value Date   GLUCAP 206 (H) 03/22/2021   HGBA1C 9.1 (H) 03/16/2021    Review of Glycemic Control Results for Tracey Bryant, Tracey Bryant (MRN [id number]) as of 03/22/2021 08:57  Ref. Range 03/21/2021 07:34 03/21/2021 11:58 03/21/2021 16:49 03/21/2021 22:03 03/22/2021 08:52  Glucose-Capillary Latest Ref Range: 70 - 99 mg/dL 109 (H) 116 (H) 208 (H) 243 (H) 206 (H)   Diabetes history: DM 2 Outpatient Diabetes medications: Glipizide XL 5 mg QAM, Metformin 1000 mg BID Current orders for Inpatient glycemic control:  Lantus 20 units Daily Novolog 0-15 units tid + hs  Nepro tid between meals  Inpatient Diabetes Program Recommendations:    -  Reduce Novolog Correction to "sensitive" due to renal function 0-9 units tid  -  Consider Novolog 2 units tid meal coverage if eating >50% of meals  Thanks,  Tama Headings RN, MSN, BC-ADM Inpatient Diabetes Coordinator Team Pager [phone] (8a-5p)

## 2021-03-22 NOTE — Progress Notes (Signed)
PROGRESS NOTE  Tracey Bryant JAS:[id number] DOB: 1951-05-24 DOA: 03/16/2021 PCP: Coolidge Breeze, FNP  Brief History:   70 year old female with a history of diabetes mellitus type 2, cardiomyopathy, permanent pacemaker, A. Fib on Eliquis, hypertension, stroke presenting with 2-day history of nausea, vomiting, and generalized weakness.    And abdominal pain, she had ED visit due to epistaxis, treated with Afrin and cotton ball with some improvement, patient has another ED visit secondary to DKA, as well she was also noted to be anemic.  She is a Restaurant manager, fast food.  CT of the abdomen and pelvis showed small perihepatic ascites, cholelithiasis with gallbladder wall thickening without biliary ductal dilatation.  RUQ [place] confirmed acute calculous cholecystitis.  General surgery at [facility] altered, undetermined patient is high risk for surgery given her cardiomyopathy and anemia, patient was transferred to [facility] 3/24 for percutaneous gallbladder drain by IR , as well hospital stay was noted for worsening anemia, worsening renal failure .  Subjective:  Patient still feeling weak, but overall she is feeling better, no dyspnea, no abdominal pain, no fever or chills.    Assessment/Plan:  DKA type II -she has been on insulin drip, anion gap has closed, she is transitioned to subcutaneous insulin treatment. -CBG controlled on current regimen of 20 units of Lantus and sliding scale. -HbA1C--9.1  Severe Sepsis -present on admission -had hypotension, leukocytosis, elevated lactate, tachycardia -due to UTI and cholecystitis -Urine culture growing pansensitive E. Coli. -He does not want any ER remain negative -continue Zosyn -Received IV fluids initially. -BP, tachycardia improved -rapid rise in WBC-->pt received dose Granix on 03/17/21 (unclear reason)  Acute cholecystitis -3/23 CT abd as above -03/18/21 abd US-GB wall thickened with impacted stones in GB neck; no  hydronephrosis -concerned about cholecystitis -general surgery consulted>>poor surgical candidate>>IR placed percutaneous gallbladder drain 3/24. -Drain care by IR.  Still with significant output.   -Continue with IV Zosyn.  Transaminitis -GI input greatly appreciated, trending down   Leukemoid reaction -pt received Granix on 03/17/21 at 1700  UTI--GNR -E. coli, pansensitive, continue with Zosyn  AKI on CKD -baseline creatinine ~1.6-1.7, trending up, creatinine peaked up to around 4 levels, appears to be plateaued for last couple days , renal input greatly appreciated, will hold on diuresis given evaded creatinine . -Continue with oral bicarb -hypokalemia with low Potassium3.4, will replete.  Urinary retention -  800 cc retention, Foley catheter inserted 3/26.  Anemia, multifactorial due to anemia of acute blood loss and anemia of chronic kidney disease -Secondary to epistaxis, CKD -apixaban on hold -FOBT negative x2, then positive, this is likely due to epistaxis, significant GI -03/18/21 FOBT Positive AFTER patient started on oral iron -Patient is Jehovah's Witness, she dosen't want any PRBC. -Iron saturation 14, ferritin 99 -Received Aranesp and IV iron.  History of atrial fibrillation -Eliquis on hold in the setting of profound anemia, and history of epistaxis, she is Jehovah's Witness  cannot give any PRBC transfusion.  At this point risks outweigh benefits to start her back on full anticoagulation.  Chronic systolic and diastolic CHF -04/14/36 Echo EF 25-30%, G2DD, global HK -While discussing medicine, patient with known history of A. fib, on Eliquis acute CVA in the past, she required permanent pacemaker insertion around 8 months ago at [facility], but she did not follow with any cardiologist in outpatient setting yet. -Patient remains soft, no Coreg or Entresto.  History of stroke -Apixaban on hold  secondary to acute blood loss  anemia  Hyperlipidemia -Continue Zetia  Hyperkalemia -lokelma x 1 -improved  Thrombocytopenia -improved  Hyponatremia -Most likely due to the excess volume, monitor  Status is: Inpatient  Remains inpatient appropriate because:Hemodynamically unstable, Unsafe d/c plan and IV treatments appropriate due to intensity of illness or inability to take PO   Dispo: The patient is from: Home  Anticipated d/c is to: Home  Patient currently is not medically stable to d/c.              Difficult to place patient No        Family Communication:  son updated by phone daily  Consultants:  General surgery  Code Status:  FULL   DVT Prophylaxis:  SCDs   Procedures: As Listed in Progress Note Above  Antibiotics: Zosyn 3/22>>       Objective: Vitals:   03/21/21 2355 03/22/21 0458 03/22/21 0600 03/22/21 0804  BP: (!) 143/63 135/63  125/74  Pulse: 71 66  67  Resp: 19 17    Temp: 97.7 F (36.5 C) 97.7 F (36.5 C)  97.8 F (36.6 C)  TempSrc: Oral Oral  Oral  SpO2: 97% 98%  96%  Weight:   91.9 kg   Height:        Intake/Output Summary (Last 24 hours) at 03/22/2021 1332 Last data filed at 03/22/2021 1540 Gross per 24 hour  Intake 16 ml  Output 955 ml  Net -939 ml   Weight change: -0.4 kg Exam:  Awake Alert, Oriented X 3, No new F.N deficits, Normal affect Symmetrical Chest wall movement, Good air movement bilaterally, at the bases, she was encouraged with incentive spirometer RRR,No Gallops,Rubs or new Murmurs, No Parasternal Heave +ve B.Sounds, Abd Soft, right upper quadrant drain No Cyanosis, Clubbing ,+1 edema, No new Rash or bruise     Data Reviewed: I have personally reviewed following labs and imaging studies Basic Metabolic Panel: Recent Labs  Lab 03/18/21 0630 03/19/21 0504 03/20/21 0147 03/21/21 0115 03/22/21 0219  NA 131* 135 131* 131* 131*  K 5.2* 4.1 4.0 4.3 3.4*  CL 104 106 106 105 103  CO2  13* 14* 15* 12* 15*  GLUCOSE 323* 184* 146* 169* 256*  BUN 78* 85* 90* 92* 88*  CREATININE 3.40* 3.59* 3.96* 3.99* 3.83*  CALCIUM 7.4* 7.1* 6.6* 6.7* 6.6*   Liver Function Tests: Recent Labs  Lab 03/18/21 0924 03/19/21 0504 03/20/21 0147 03/21/21 0115 03/22/21 0219  AST 804* 396* 317* 338* 193*  ALT 794* 792* 723* 847* 652*  ALKPHOS 40 44 50 76 59  BILITOT 0.6 0.7 0.6 1.2 0.7  PROT 5.5* 5.5* 5.2* 5.7* 5.4*  ALBUMIN 2.6* 2.5* 2.2* 2.3* 2.0*   Recent Labs  Lab 03/16/21 1300  LIPASE 32   No results for input(s): AMMONIA in the last 168 hours. Coagulation Profile: Recent Labs  Lab 03/17/21 0456 03/18/21 0630 03/19/21 0504 03/20/21 0147 03/21/21 0115  INR 1.2 1.6* 1.4* 1.6* 1.5*   CBC: Recent Labs  Lab 03/16/21 1300 03/17/21 0456 03/18/21 0630 03/19/21 0504 03/20/21 0147 03/21/21 0115 03/22/21 0219  WBC 16.3*   < > 55.8* 50.6* 37.0* 32.3* 22.6*  NEUTROABS 13.7*  --   --   --   --   --   --   HGB 7.1*   < > 6.8* 6.9* 6.1* 6.6* 6.2*  HCT 22.8*   < > 22.2* 21.9* 19.0* 20.6* 20.0*  MCV 94.6   < > 97.4 96.5 92.7 94.1 93.0  PLT 314   < > 292 298 270 129* 266   < > = values in this interval not displayed.   Cardiac Enzymes: No results for input(s): CKTOTAL, CKMB, CKMBINDEX, TROPONINI in the last 168 hours. BNP: Invalid input(s): POCBNP CBG: Recent Labs  Lab 03/21/21 0734 03/21/21 1158 03/21/21 1649 03/21/21 2203 03/22/21 0852  GLUCAP 109* 116* 208* 243* 206*   HbA1C: No results for input(s): HGBA1C in the last 72 hours. Urine analysis:    Component Value Date/Time   COLORURINE AMBER (A) 03/17/2021 1208   APPEARANCEUR CLOUDY (A) 03/17/2021 1208   LABSPEC 1.015 03/17/2021 1208   PHURINE 5.0 03/17/2021 1208   GLUCOSEU 50 (A) 03/17/2021 1208   HGBUR SMALL (A) 03/17/2021 1208   BILIRUBINUR NEGATIVE 03/17/2021 [place] 03/17/2021 1208   PROTEINUR >=300 (A) 03/17/2021 1208   UROBILINOGEN 0.2 01/17/2013 1910   NITRITE NEGATIVE  03/17/2021 1208   LEUKOCYTESUR LARGE (A) 03/17/2021 1208   Sepsis Labs: @LABRCNTIP (procalcitonin:4,lacticidven:4) ) Recent Results (from the past 240 hour(s))  SARS CORONAVIRUS 2 (TAT 6-24 HRS) Nasopharyngeal Nasopharyngeal Swab     Status: None   Collection Time: 03/16/21  1:32 PM   Specimen: Nasopharyngeal Swab  Result Value Ref Range Status   SARS Coronavirus 2 NEGATIVE NEGATIVE Final    Comment: (NOTE) SARS-CoV-2 target nucleic acids are NOT DETECTED.  The SARS-CoV-2 RNA is generally detectable in upper and lower respiratory specimens during the acute phase of infection. Negative results do not preclude SARS-CoV-2 infection, do not rule out co-infections with other pathogens, and should not be used as the sole basis for treatment or other patient management decisions. Negative results must be combined with clinical observations, patient history, and epidemiological information. The expected result is Negative.  Fact Sheet for Patients: SugarRoll.be  Fact Sheet for Healthcare Providers: https://www.woods-mathews.com/  This test is not yet approved or cleared by the [place] FDA and  has been authorized for detection and/or diagnosis of SARS-CoV-2 by FDA under an Emergency Use Authorization (EUA). This EUA will remain  in effect (meaning this test can be used) for the duration of the COVID-19 declaration under Se ction 564(b)(1) of the Act, 21 U.S.C. section 360bbb-3(b)(1), unless the authorization is terminated or revoked sooner.  Performed at [facility], [place] [address], [place], [place] [address]   MRSA PCR Screening     Status: None   Collection Time: 03/16/21  5:06 PM   Specimen: Nasal Mucosa; Nasopharyngeal  Result Value Ref Range Status   MRSA by PCR NEGATIVE NEGATIVE Final    Comment:        The GeneXpert MRSA Assay (FDA approved for NASAL specimens only), is one component of a comprehensive MRSA  colonization surveillance program. It is not intended to diagnose MRSA infection nor to guide or monitor treatment for MRSA infections. Performed at [facility], [address], [place], [place] [address]   Culture, Urine     Status: Abnormal   Collection Time: 03/17/21 12:08 PM   Specimen: Urine, Clean Catch  Result Value Ref Range Status   Specimen Description   Final    URINE, CLEAN CATCH Performed at [facility], [address] [place], [place] [address]    Special Requests   Final    NONE Performed at [facility], [address], [place], [place] [address]    Culture >=100,000 COLONIES/mL ESCHERICHIA COLI (A)  Final   Report Status 03/19/2021 FINAL  Final   Organism ID, Bacteria ESCHERICHIA  COLI (A)  Final      Susceptibility   Escherichia coli - MIC*    AMPICILLIN 4 SENSITIVE Sensitive     CEFAZOLIN <=4 SENSITIVE Sensitive     CEFEPIME <=0.12 SENSITIVE Sensitive     CEFTRIAXONE <=0.25 SENSITIVE Sensitive     CIPROFLOXACIN <=0.25 SENSITIVE Sensitive     GENTAMICIN <=1 SENSITIVE Sensitive     IMIPENEM <=0.25 SENSITIVE Sensitive     NITROFURANTOIN <=16 SENSITIVE Sensitive     TRIMETH/SULFA <=20 SENSITIVE Sensitive     AMPICILLIN/SULBACTAM <=2 SENSITIVE Sensitive     PIP/TAZO <=4 SENSITIVE Sensitive     * >=100,000 COLONIES/mL ESCHERICHIA COLI  Culture, blood (routine x 2)     Status: None   Collection Time: 03/17/21  1:18 PM   Specimen: BLOOD  Result Value Ref Range Status   Specimen Description BLOOD LEFT ANTECUBITAL  Final   Special Requests   Final    Blood Culture adequate volume BOTTLES DRAWN AEROBIC AND ANAEROBIC   Culture   Final    NO GROWTH 5 DAYS Performed at [facility], [address], [place], [place] [address]    Report Status 03/22/2021 FINAL  Final  Culture, blood (routine x 2)     Status: None   Collection Time: 03/17/21  1:19 PM   Specimen: BLOOD LEFT HAND  Result Value Ref Range Status   Specimen Description BLOOD LEFT HAND  Final   Special  Requests   Final    Blood Culture results may not be optimal due to an inadequate volume of blood received in culture bottles BOTTLES DRAWN AEROBIC ONLY   Culture   Final    NO GROWTH 5 DAYS Performed at [facility], [address], [place], [place] [address]    Report Status 03/22/2021 FINAL  Final  Aerobic/Anaerobic Culture (surgical/deep wound)     Status: None (Preliminary result)   Collection Time: 03/19/21  2:51 PM   Specimen: Abdomen; Abdominal Fluid  Result Value Ref Range Status   Specimen Description ABDOMEN  Final   Special Requests GALLBLADDER  Final   Gram Stain NO WBC SEEN NO ORGANISMS SEEN   Final   Culture   Final    NO GROWTH 3 DAYS NO ANAEROBES ISOLATED; CULTURE IN PROGRESS FOR 5 DAYS Performed at [facility], [address] [place], [place] [address]    Report Status PENDING  Incomplete     Scheduled Meds: . Chlorhexidine Gluconate Cloth  6 each Topical Daily  . ezetimibe  10 mg Oral Daily  . feeding supplement (NEPRO CARB STEADY)  237 mL Oral TID BM  . insulin aspart  0-15 Units Subcutaneous TID WC  . insulin aspart  0-5 Units Subcutaneous QHS  . insulin glargine  20 Units Subcutaneous Daily  . metoCLOPramide (REGLAN) injection  10 mg Intravenous Q8H  . multivitamin with minerals  1 tablet Oral Daily  . sodium bicarbonate  1,300 mg Oral TID  . sodium chloride flush  3 mL Intravenous Q12H  . sodium chloride flush  5 mL Intracatheter Q8H   Continuous Infusions: . ferumoxytol Stopped (03/20/21 2015)  . piperacillin-tazobactam (ZOSYN)  IV 3.375 g (03/22/21 0931)    Procedures/Studies: CT ABDOMEN PELVIS WO CONTRAST  Result Date: 03/17/2021 CLINICAL DATA:  Nonlocalized abdominal pain. Increased lactic acid. Lethargy. Confusion. EXAM: CT ABDOMEN AND PELVIS WITHOUT CONTRAST TECHNIQUE: Multidetector CT imaging of the abdomen and pelvis was performed following the standard protocol without IV contrast. COMPARISON:  01/17/2013 FINDINGS: Lower chest:  Left  lower lobe 9 mm pulmonary nodule on 25/4 is similar in 2014 and considered benign. Bibasilar subsegmental atelectasis. Mild cardiomegaly with pacer. Distal right coronary artery calcification. Small bilateral pleural effusions. Hepatobiliary: Small volume perihepatic ascites. Subtle hypoattenuation involving the anterior portion of segment 4 B, including on 28/2. On the order of 5.0 cm. 2.0 cm gallstone with gallbladder wall thickening up to 9 mm. No biliary duct dilatation. Pancreas: Fatty replacement involving the pancreatic head and uncinate process. Spleen: Normal in size, without focal abnormality. Adrenals/Urinary Tract: Normal adrenal glands. Mild renal cortical thinning bilaterally. Interpolar right renal exophytic 7.4 cm fluid density lesion with calcification in its wall. Trace air within the nondependent bladder including on 79/2. No ureteric or bladder calculi. Stomach/Bowel: Proximal gastric underdistention. Colonic stool burden suggests constipation. Normal terminal ileum and appendix. Normal small bowel. Vascular/Lymphatic: Advanced aortic and branch vessel atherosclerosis. Retroaortic left renal vein. No abdominopelvic adenopathy. Reproductive: Normal uterus and adnexa. Other: Small volume abdominopelvic fluid including adjacent the liver and spleen. Musculoskeletal: Mild anasarca. Remote eighth posterior left rib fracture. IMPRESSION: 1. Cholelithiasis with gallbladder wall thickening. Correlate with right upper quadrant symptoms and possibly ultrasound to exclude acute cholecystitis. 2.  Possible constipation. 3. Bilateral small pleural effusions and abdominopelvic ascites with anasarca. Question fluid overload. 4. Coronary artery atherosclerosis. Aortic Atherosclerosis (ICD10-I70.0). 5. Segment 4 B hypoattenuation could represent prominent focal steatosis but is indeterminate. Consider nonemergent, outpatient pre and post contrast abdominal MRI. 6. Trace air within the bladder.  Correlate with  instrumentation. Electronically Signed   By: Abigail Miyamoto M.D.   On: 03/17/2021 16:38   US Abdomen Complete  Result Date: 03/18/2021 CLINICAL DATA:  Cholelithiasis, acute renal failure EXAM: ABDOMEN ULTRASOUND COMPLETE COMPARISON:  CT 03/17/2021 FINDINGS: Gallbladder: The gallbladder is distended. Several shadowing gallstones are seen impacted within the gallbladder neck and there is layering sludge within the gallbladder. The gallbladder wall is markedly thickened measuring up to 14 mm in diameter and mild pericholecystic fluid is identified. The sonographic Percell Miller sign is reportedly positive. Common bile duct: Diameter: 4-5 mm in proximal diameter. Liver: No focal lesion identified. Within normal limits in parenchymal echogenicity. Portal vein is patent on color Doppler imaging with normal direction of blood flow towards the liver. IVC: No abnormality visualized. Pancreas: Visualized portion unremarkable. Spleen: Size and appearance within normal limits. Right Kidney: Length: 10.9 cm. Echogenicity within normal limits. No mass or hydronephrosis visualized. 8.1 cm simple cortical cyst identified within the upper pole. Left Kidney: Length: 11.0 cm. Echogenicity within normal limits. No mass or hydronephrosis visualized. Abdominal aorta: No aneurysm visualized, though the distal aorta is obscured by overlying bowel gas. Other findings: Small right pleural effusion is present. Mild perihepatic and perisplenic ascites is noted. IMPRESSION: Acute calculus cholecystitis. Mild ascites.  Small right pleural effusion. 8.1 cm right upper pole simple cortical cyst. This is best characterized as a Bosniak class 1 cyst and further follow-up is not required. Electronically Signed   By: Fidela Salisbury MD   On: 03/18/2021 12:51   US Abdomen Limited  Result Date: 03/18/2021 CLINICAL DATA:  Right upper quadrant abdominal pain EXAM: ULTRASOUND ABDOMEN LIMITED RIGHT UPPER QUADRANT COMPARISON:  CT 03/17/2021 FINDINGS:  Gallbladder: The gallbladder is distended, there is marked gallbladder wall thickening, and mild pericholecystic fluid identified. The gallbladder contains layering sludge and stones. Stones are seen within the gallbladder neck, however, decubitus positioning was not performed to assess for impaction. The sonographic Percell Miller sign is reportedly positive. Common bile duct: Diameter: 4 mm  in proximal diameter Liver: No focal lesion identified. Within normal limits in parenchymal echogenicity. Portal vein is patent on color Doppler imaging with normal direction of blood flow towards the liver. Other: Mild perihepatic ascites is present. Small right pleural effusion is noted. 8.9 cm simple cortical cyst is noted within the upper pole of the right kidney. IMPRESSION: Acute calculus cholecystitis. Mild perihepatic ascites and small right pleural effusion. Electronically Signed   By: Fidela Salisbury MD   On: 03/18/2021 12:46   DG Chest Port 1 View  Result Date: 03/20/2021 CLINICAL DATA:  Hypoxia. EXAM: PORTABLE CHEST 1 VIEW COMPARISON:  January 17, 2013. FINDINGS: Stable cardiomediastinal silhouette. Mild central pulmonary vascular congestion is noted. No pneumothorax or pleural effusion is noted. No consolidative process is noted. 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 Bony thorax is unremarkable. IMPRESSION: Mild central pulmonary vascular congestion. Electronically Signed   By: Marijo Conception M.D.   On: 03/20/2021 08:55   ECHOCARDIOGRAM COMPLETE  Result Date: 03/18/2021    ECHOCARDIOGRAM REPORT   Patient Name:   Tracey Bryant Date of Exam: 03/18/2021 Medical Rec #:  [id number]       Height:       66.0 in Accession #:    [id number]      Weight:       180.8 lb Date of Birth:  September 22, 1951       BSA:          1.916 m Patient Age:    72 years        BP:           126/51 mmHg Patient Gender: F               HR:           71 bpm. Exam Location:  [facility] Procedure: 2D Echo and Intracardiac Opacification Agent  Indications:    CHF-Acute Systolic B16.94                 SBE I33.9  History:        Patient has no prior history of Echocardiogram examinations.                 Stroke; Risk Factors:Non-Smoker, Diabetes and Hypertension.                 Rheumatoid Arthritis.  Sonographer:    Leavy Cella RDCS (AE) Referring Phys: [phone] SEYED A SHAHMEHDI IMPRESSIONS  1. Left ventricular ejection fraction, by estimation, is 25 to 30%. The left ventricle has normal function. The left ventricle demonstrates global hypokinesis. There is mild left ventricular hypertrophy. Left ventricular diastolic parameters are consistent with Grade II diastolic dysfunction (pseudonormalization). Elevated left atrial pressure.  2. RV poorly visualized, grossly appears enlarged with decreased systolic function. . Right ventricular systolic function was not well visualized. The right ventricular size is not well visualized.  3. Left atrial size was severely dilated.  4. The mitral valve is normal in structure. No evidence of mitral valve regurgitation. No evidence of mitral stenosis.  5. The aortic valve is tricuspid. There is mild calcification of the aortic valve. There is mild thickening of the aortic valve. Aortic valve regurgitation is not visualized. No aortic stenosis is present.  6. Mild pulmonary HTN, PASP is 36 mmHg.  7. The inferior vena cava is normal in size with greater than 50% respiratory variability, suggesting right atrial pressure of 3 mmHg. FINDINGS  Left Ventricle: Left ventricular ejection fraction, by estimation,  is 25 to 30%. The left ventricle has normal function. The left ventricle demonstrates global hypokinesis. The left ventricular internal cavity size was normal in size. There is mild left  ventricular hypertrophy. Left ventricular diastolic parameters are consistent with Grade II diastolic dysfunction (pseudonormalization). Elevated left atrial pressure. Right Ventricle: RV poorly visualized, grossly appears enlarged  with decreased systolic function. The right ventricular size is not well visualized. Right vetricular wall thickness was not well visualized. Right ventricular systolic function was not well  visualized. Left Atrium: Left atrial size was severely dilated. Right Atrium: Right atrial size was not well visualized. Pericardium: There is no evidence of pericardial effusion. Mitral Valve: The mitral valve is normal in structure. There is mild thickening of the mitral valve leaflet(s). There is mild calcification of the mitral valve leaflet(s). Mild mitral annular calcification. No evidence of mitral valve regurgitation. No evidence of mitral valve stenosis. Tricuspid Valve: The tricuspid valve is normal in structure. Tricuspid valve regurgitation is mild . No evidence of tricuspid stenosis. Aortic Valve: The aortic valve is tricuspid. There is mild calcification of the aortic valve. There is mild thickening of the aortic valve. There is mild aortic valve annular calcification. Aortic valve regurgitation is not visualized. No aortic stenosis  is present. Aortic valve mean gradient measures 2.3 mmHg. Aortic valve peak gradient measures 4.6 mmHg. Aortic valve area, by VTI measures 1.70 cm. Pulmonic Valve: The pulmonic valve was not well visualized. Pulmonic valve regurgitation is not visualized. No evidence of pulmonic stenosis. Aorta: The aortic root is normal in size and structure. Pulmonary Artery: Mild pulmonary HTN, PASP is 36 mmHg. Venous: The inferior vena cava is normal in size with greater than 50% respiratory variability, suggesting right atrial pressure of 3 mmHg. IAS/Shunts: No atrial level shunt detected by color flow Doppler.  LEFT VENTRICLE PLAX 2D LVIDd:         4.08 cm  Diastology LVIDs:         3.43 cm  LV e' medial:    4.79 cm/s LV PW:         1.34 cm  LV E/e' medial:  24.4 LV IVS:        1.08 cm  LV e' lateral:   7.83 cm/s LVOT diam:     1.80 cm  LV E/e' lateral: 14.9 LV SV:         33 LV SV Index:   17  LVOT Area:     2.54 cm  RIGHT VENTRICLE RV S prime:     9.90 cm/s TAPSE (M-mode): 1.2 cm LEFT ATRIUM             Index       RIGHT ATRIUM           Index LA diam:        4.60 cm 2.40 cm/m  RA Area:     12.90 cm LA Vol (A2C):   61.2 ml 31.95 ml/m RA Volume:   28.60 ml  14.93 ml/m LA Vol (A4C):   92.0 ml 48.02 ml/m LA Biplane Vol: 78.2 ml 40.82 ml/m  AORTIC VALVE AV Area (Vmax):    1.82 cm AV Area (Vmean):   1.50 cm AV Area (VTI):     1.70 cm AV Vmax:           107.72 cm/s AV Vmean:          71.452 cm/s AV VTI:            0.191 m AV  Peak Grad:      4.6 mmHg AV Mean Grad:      2.3 mmHg LVOT Vmax:         77.12 cm/s LVOT Vmean:        42.238 cm/s LVOT VTI:          0.128 m LVOT/AV VTI ratio: 0.67  AORTA Ao Root diam: 2.50 cm MITRAL VALVE                TRICUSPID VALVE MV Area (PHT): 4.77 cm     TR Peak grad:   33.2 mmHg MV Decel Time: 159 msec     TR Vmax:        288.00 cm/s MV E velocity: 117.00 cm/s MV A velocity: 28.40 cm/s   SHUNTS MV E/A ratio:  4.12         Systemic VTI:  0.13 m                             Systemic Diam: 1.80 cm Carlyle Dolly MD Electronically signed by Carlyle Dolly MD Signature Date/Time: 03/18/2021/2:24:49 PM    Final    CT IMAGE GUIDED DRAINAGE BY PERCUTANEOUS CATHETER  Result Date: 03/19/2021 INDICATION: Acute cholecystitis EXAM: CT -GUIDED CHOLECYSTOSTOMY TUBE PLACEMENT COMPARISON:  03/17/2021 MEDICATIONS: The patient is currently admitted to the hospital and on intravenous antibiotics. Antibiotics were administered within an appropriate time frame prior to skin puncture. ANESTHESIA/SEDATION: Moderate (conscious) sedation was employed during this procedure. A total of Versed 1.5 mg and Fentanyl 75 mcg was administered intravenously. Moderate Sedation Time: 20 minutes. The patient's level of consciousness and vital signs were monitored continuously by radiology nursing throughout the procedure under my direct supervision. CONTRAST:  None. FLUOROSCOPY TIME:  None.  COMPLICATIONS: None immediate. PROCEDURE: Informed written consent was obtained from the patient after a discussion of the risks, benefits and alternatives to treatment. Questions regarding the procedure were encouraged and answered. A timeout was performed prior to the initiation of the procedure. The right upper abdominal quadrant was prepped and draped in the usual sterile fashion, and a sterile drape was applied covering the operative field. Maximum barrier sterile technique with sterile gowns and gloves were used for the procedure. A timeout was performed prior to the initiation of the procedure. Local anesthesia was provided with 1% lidocaine with epinephrine. Limited upper abdominal CT demonstrates a markedly dilated gallbladder. Utilizing a transhepatic approach, an 18 gauge trocar needle was advanced into the gallbladder under intermittent CT guidance. An Amplatz wire was inserted and coiled within the gallbladder. Appropriate position was confirmed with limited CT. Serial dilation was performed and ultimately a 10.2-French Cook multipurpose drainage tube was advanced into the gallbladder infundibulum, coiled and locked. Limited upper abdominal CT was then obtained to confirm adequate positioning. Bile was aspirated. The catheter was secured to the skin with suture, connected to a drainage bag and a dressing was placed. The patient tolerated the procedure well without immediate post procedural complication. IMPRESSION: Successful CT guided placement of a 10.2 French cholecystostomy tube. PLAN: Follow-up with cholecystostomy tube check and change in 2 months. Ruthann Cancer, MD [facility] Electronically Signed   By: Ruthann Cancer MD   On: 03/19/2021 16:39    Phillips Climes, MD  [facility]  If 7PM-7AM, please contact night-coverage [email or web address] 03/22/2021, 1:32 PM   LOS: 6 days

## 2021-03-23 DIAGNOSIS — D62 Acute posthemorrhagic anemia: Secondary | ICD-10-CM | POA: Diagnosis not present

## 2021-03-23 DIAGNOSIS — K810 Acute cholecystitis: Secondary | ICD-10-CM | POA: Diagnosis not present

## 2021-03-23 DIAGNOSIS — E0810 Diabetes mellitus due to underlying condition with ketoacidosis without coma: Secondary | ICD-10-CM | POA: Diagnosis not present

## 2021-03-23 DIAGNOSIS — N390 Urinary tract infection, site not specified: Secondary | ICD-10-CM | POA: Diagnosis not present

## 2021-03-23 LAB — COMPREHENSIVE METABOLIC PANEL
ALT: 537 U/L — ABNORMAL HIGH (ref 0–44)
AST: 106 U/L — ABNORMAL HIGH (ref 15–41)
Albumin: 2 g/dL — ABNORMAL LOW (ref 3.5–5.0)
Alkaline Phosphatase: 53 U/L (ref 38–126)
Anion gap: 11 (ref 5–15)
BUN: 79 mg/dL — ABNORMAL HIGH (ref 8–23)
CO2: 17 mmol/L — ABNORMAL LOW (ref 22–32)
Calcium: 6.7 mg/dL — ABNORMAL LOW (ref 8.9–10.3)
Chloride: 107 mmol/L (ref 98–111)
Creatinine, Ser: 3.25 mg/dL — ABNORMAL HIGH (ref 0.44–1.00)
GFR, Estimated: 15 mL/min — ABNORMAL LOW (ref >60)
Glucose, Bld: 159 mg/dL — ABNORMAL HIGH (ref 70–99)
Potassium: 3.4 mmol/L — ABNORMAL LOW (ref 3.5–5.1)
Sodium: 135 mmol/L (ref 135–145)
Total Bilirubin: 0.9 mg/dL (ref 0.3–1.2)
Total Protein: 5.6 g/dL — ABNORMAL LOW (ref 6.5–8.1)

## 2021-03-23 LAB — CBC
HCT: 22 % — ABNORMAL LOW (ref 36.0–46.0)
Hemoglobin: 6.8 g/dL — CL (ref 12.0–15.0)
MCH: 29.6 pg (ref 26.0–34.0)
MCHC: 30.9 g/dL (ref 30.0–36.0)
MCV: 95.7 fL (ref 80.0–100.0)
Platelets: 270 10*3/uL (ref 150–400)
RBC: 2.3 MIL/uL — ABNORMAL LOW (ref 3.87–5.11)
RDW: 18.8 % — ABNORMAL HIGH (ref 11.5–15.5)
WBC: 23.5 10*3/uL — ABNORMAL HIGH (ref 4.0–10.5)
nRBC: 1.1 % — ABNORMAL HIGH (ref 0.0–0.2)

## 2021-03-23 LAB — GLUCOSE, CAPILLARY
Glucose-Capillary: 115 mg/dL — ABNORMAL HIGH (ref 70–99)
Glucose-Capillary: 108 mg/dL — ABNORMAL HIGH (ref 70–99)
Glucose-Capillary: 152 mg/dL — ABNORMAL HIGH (ref 70–99)
Glucose-Capillary: 204 mg/dL — ABNORMAL HIGH (ref 70–99)

## 2021-03-23 LAB — MAGNESIUM: Magnesium: 2.4 mg/dL (ref 1.7–2.4)

## 2021-03-23 MED ORDER — POTASSIUM CHLORIDE CRYS ER 20 MEQ PO TBCR
40.0000 meq | EXTENDED_RELEASE_TABLET | Freq: Once | ORAL | Status: AC
  Administered 2021-03-23: 40 meq via ORAL
  Filled 2021-03-23: qty 2

## 2021-03-23 NOTE — Progress Notes (Signed)
PROGRESS NOTE  Tracey Bryant ZYS:[id number] DOB: 06/14/1951 DOA: 03/16/2021 PCP: Coolidge Breeze, FNP  Brief History:   70 year old female with a history of diabetes mellitus type 2, cardiomyopathy, permanent pacemaker, A. Fib on Eliquis, hypertension, stroke presenting with 2-day history of nausea, vomiting, and generalized weakness.    And abdominal pain, she had ED visit due to epistaxis, treated with Afrin and cotton ball with some improvement, patient has another ED visit secondary to DKA, as well she was also noted to be anemic.  She is a Restaurant manager, fast food.  CT of the abdomen and pelvis showed small perihepatic ascites, cholelithiasis with gallbladder wall thickening without biliary ductal dilatation.  RUQ [place] confirmed acute calculous cholecystitis.  General surgery at [facility] altered, undetermined patient is high risk for surgery given her cardiomyopathy and anemia, patient was transferred to [facility] 3/24 for percutaneous gallbladder drain by IR , as well hospital stay was noted for worsening anemia, worsening renal failure .  Subjective:  Patient still feeling weak, but overall she is feeling better, no dyspnea, no abdominal pain, no fever or chills.    Assessment/Plan:  DKA type II -she has been on insulin drip, anion gap has closed, she is transitioned to subcutaneous insulin treatment. -CBG controlled on current regimen of 20 units of Lantus and sliding scale. -HbA1C--9.1  Severe Sepsis due to acute cholecystitis and UTI -present on admission -had hypotension, leukocytosis, elevated lactate, tachycardia -due to UTI and cholecystitis -Urine culture growing pansensitive E. Coli. -He does not want any ER remain negative -continue Zosyn -Received IV fluids initially. -BP, tachycardia improved -rapid rise in WBC-->pt received dose Granix on 03/17/21 (unclear reason)  Acute cholecystitis -3/23 CT abd as above -03/18/21 abd US-GB wall thickened with  impacted stones in GB neck; no hydronephrosis -concerned about cholecystitis -general surgery consulted>>poor surgical candidate>>IR placed percutaneous gallbladder drain 3/24. -Drain care by IR.  Still with significant output.   -Continue with IV Zosyn.  Transaminitis -GI input greatly appreciated, trending down   Leukemoid reaction -pt received Granix on 03/17/21 at 1700  UTI-GNR -E. coli, pansensitive, continue with Zosyn  AKI on CKD -baseline creatinine ~1.6-1.7, trending up, creatinine peaked up to around 4 levels, appears to be plateaued for last couple days . -  renal input greatly appreciated, will hold on diuresis given evaded creatinine . -Continue with oral bicarb, globin improved to 17 today.  Urinary retention -Noted with 800 cc urinary retention, Foley catheter inserted 3/26.  Anemia, multifactorial due to anemia of acute blood loss and anemia of chronic kidney disease -Secondary to epistaxis, CKD -apixaban on hold -FOBT negative x2, then positive, this is likely due to epistaxis, significant GI -03/18/21 FOBT Positive AFTER patient started on oral iron -Patient is Jehovah's Witness, she dosen't want any PRBC. -Iron saturation 14, ferritin 99 -Received Aranesp and IV iron.  History of atrial fibrillation -Eliquis on hold in the setting of profound anemia, and history of epistaxis, she is Jehovah's Witness  cannot give any PRBC transfusion.  At this point risks outweigh benefits to start her back on full anticoagulation.  Chronic systolic and diastolic CHF -9/32/35 Echo EF 25-30%, G2DD, global HK -While discussing medicine, patient with known history of A. fib, on Eliquis acute CVA in the past, she required permanent pacemaker insertion around 8 months ago at [facility], but she did not follow with any cardiologist in outpatient setting yet. -Patient remains soft, no Coreg or  Entresto. -She is with evidence of some mild volume overload, but she remains on  room air hold on IV diuresis especially with her renal failure.  History of stroke -Apixaban on hold secondary to acute blood loss anemia  Hyperlipidemia -Continue Zetia  Hyperkalemia -lokelma x 1 -improved  Thrombocytopenia -improved  Hyponatremia -Most likely due to the excess volume, monitor  Status is: Inpatient  Remains inpatient appropriate because:Hemodynamically unstable, Unsafe d/c plan and IV treatments appropriate due to intensity of illness or inability to take PO   Dispo: The patient is from: Home  Anticipated d/c is to: Home  Patient currently is not medically stable to d/c.              Difficult to place patient No        Family Communication:  son updated by phone daily  Consultants:  General surgery  Code Status:  FULL   DVT Prophylaxis:  SCDs   Procedures: As Listed in Progress Note Above  Antibiotics: Zosyn 3/22>>       Objective: Vitals:   03/22/21 2352 03/23/21 0355 03/23/21 0748 03/23/21 1147  BP: (!) 122/58 120/62 131/66 (!) 118/55  Pulse: 66 64 69 70  Resp: 14 20 20 18   Temp: 98.2 F (36.8 C) (!) 97.3 F (36.3 C) 98.2 F (36.8 C) 98.2 F (36.8 C)  TempSrc: Oral Oral Oral Oral  SpO2: 97% 97% 96% 97%  Weight:      Height:        Intake/Output Summary (Last 24 hours) at 03/23/2021 1538 Last data filed at 03/23/2021 1430 Gross per 24 hour  Intake 740 ml  Output 1760 ml  Net -1020 ml   Weight change:  Exam:  Awake Alert, Oriented X 3, frail, chronically ill-appearing no new F.N deficits, Normal affect Symmetrical Chest wall movement, Good air movement bilaterally, diminished at the bases RRR,No Gallops,Rubs or new Murmurs, No Parasternal Heave +ve B.Sounds, Abd Soft, she has right upper quadrant drain No Cyanosis, Clubbing ,+1 edema, No new Rash or bruise     Data Reviewed: I have personally reviewed following labs and imaging studies Basic Metabolic Panel: Recent  Labs  Lab 03/19/21 0504 03/20/21 0147 03/21/21 0115 03/22/21 0219 03/23/21 0231  NA 135 131* 131* 131* 135  K 4.1 4.0 4.3 3.4* 3.4*  CL 106 106 105 103 107  CO2 14* 15* 12* 15* 17*  GLUCOSE 184* 146* 169* 256* 159*  BUN 85* 90* 92* 88* 79*  CREATININE 3.59* 3.96* 3.99* 3.83* 3.25*  CALCIUM 7.1* 6.6* 6.7* 6.6* 6.7*  MG  --   --   --   --  2.4   Liver Function Tests: Recent Labs  Lab 03/19/21 0504 03/20/21 0147 03/21/21 0115 03/22/21 0219 03/23/21 0231  AST 396* 317* 338* 193* 106*  ALT 792* 723* 847* 652* 537*  ALKPHOS 44 50 76 59 53  BILITOT 0.7 0.6 1.2 0.7 0.9  PROT 5.5* 5.2* 5.7* 5.4* 5.6*  ALBUMIN 2.5* 2.2* 2.3* 2.0* 2.0*   No results for input(s): LIPASE, AMYLASE in the last 168 hours. No results for input(s): AMMONIA in the last 168 hours. Coagulation Profile: Recent Labs  Lab 03/17/21 0456 03/18/21 0630 03/19/21 0504 03/20/21 0147 03/21/21 0115  INR 1.2 1.6* 1.4* 1.6* 1.5*   CBC: Recent Labs  Lab 03/19/21 0504 03/20/21 0147 03/21/21 0115 03/22/21 0219 03/23/21 0231  WBC 50.6* 37.0* 32.3* 22.6* 23.5*  HGB 6.9* 6.1* 6.6* 6.2* 6.8*  HCT 21.9* 19.0* 20.6* 20.0* 22.0*  MCV  96.5 92.7 94.1 93.0 95.7  PLT 298 270 129* 266 270   Cardiac Enzymes: No results for input(s): CKTOTAL, CKMB, CKMBINDEX, TROPONINI in the last 168 hours. BNP: Invalid input(s): POCBNP CBG: Recent Labs  Lab 03/22/21 1338 03/22/21 1658 03/22/21 2142 03/23/21 0751 03/23/21 1150  GLUCAP 231* 213* 202* 115* 108*   HbA1C: No results for input(s): HGBA1C in the last 72 hours. Urine analysis:    Component Value Date/Time   COLORURINE AMBER (A) 03/17/2021 1208   APPEARANCEUR CLOUDY (A) 03/17/2021 1208   LABSPEC 1.015 03/17/2021 1208   PHURINE 5.0 03/17/2021 1208   GLUCOSEU 50 (A) 03/17/2021 1208   HGBUR SMALL (A) 03/17/2021 1208   BILIRUBINUR NEGATIVE 03/17/2021 [place] 03/17/2021 1208   PROTEINUR >=300 (A) 03/17/2021 1208   UROBILINOGEN 0.2 01/17/2013  1910   NITRITE NEGATIVE 03/17/2021 1208   LEUKOCYTESUR LARGE (A) 03/17/2021 1208   Sepsis Labs: @LABRCNTIP (procalcitonin:4,lacticidven:4) ) Recent Results (from the past 240 hour(s))  SARS CORONAVIRUS 2 (TAT 6-24 HRS) Nasopharyngeal Nasopharyngeal Swab     Status: None   Collection Time: 03/16/21  1:32 PM   Specimen: Nasopharyngeal Swab  Result Value Ref Range Status   SARS Coronavirus 2 NEGATIVE NEGATIVE Final    Comment: (NOTE) SARS-CoV-2 target nucleic acids are NOT DETECTED.  The SARS-CoV-2 RNA is generally detectable in upper and lower respiratory specimens during the acute phase of infection. Negative results do not preclude SARS-CoV-2 infection, do not rule out co-infections with other pathogens, and should not be used as the sole basis for treatment or other patient management decisions. Negative results must be combined with clinical observations, patient history, and epidemiological information. The expected result is Negative.  Fact Sheet for Patients: SugarRoll.be  Fact Sheet for Healthcare Providers: https://www.woods-mathews.com/  This test is not yet approved or cleared by the [place] FDA and  has been authorized for detection and/or diagnosis of SARS-CoV-2 by FDA under an Emergency Use Authorization (EUA). This EUA will remain  in effect (meaning this test can be used) for the duration of the COVID-19 declaration under Se ction 564(b)(1) of the Act, 21 U.S.C. section 360bbb-3(b)(1), unless the authorization is terminated or revoked sooner.  Performed at [facility], [place] [address], [place], [place] [address]   MRSA PCR Screening     Status: None   Collection Time: 03/16/21  5:06 PM   Specimen: Nasal Mucosa; Nasopharyngeal  Result Value Ref Range Status   MRSA by PCR NEGATIVE NEGATIVE Final    Comment:        The GeneXpert MRSA Assay (FDA approved for NASAL specimens only), is one component of  a comprehensive MRSA colonization surveillance program. It is not intended to diagnose MRSA infection nor to guide or monitor treatment for MRSA infections. Performed at [facility], [address], [place], [place] [address]   Culture, Urine     Status: Abnormal   Collection Time: 03/17/21 12:08 PM   Specimen: Urine, Clean Catch  Result Value Ref Range Status   Specimen Description   Final    URINE, CLEAN CATCH Performed at [facility], [address] [place], [place] [address]    Special Requests   Final    NONE Performed at [facility], [address] [place], [place] [address]    Culture >=100,000 COLONIES/mL ESCHERICHIA COLI (A)  Final   Report Status 03/19/2021 FINAL  Final   Organism ID, Bacteria ESCHERICHIA COLI (A)  Final      Susceptibility  Escherichia coli - MIC*    AMPICILLIN 4 SENSITIVE Sensitive     CEFAZOLIN <=4 SENSITIVE Sensitive     CEFEPIME <=0.12 SENSITIVE Sensitive     CEFTRIAXONE <=0.25 SENSITIVE Sensitive     CIPROFLOXACIN <=0.25 SENSITIVE Sensitive     GENTAMICIN <=1 SENSITIVE Sensitive     IMIPENEM <=0.25 SENSITIVE Sensitive     NITROFURANTOIN <=16 SENSITIVE Sensitive     TRIMETH/SULFA <=20 SENSITIVE Sensitive     AMPICILLIN/SULBACTAM <=2 SENSITIVE Sensitive     PIP/TAZO <=4 SENSITIVE Sensitive     * >=100,000 COLONIES/mL ESCHERICHIA COLI  Culture, blood (routine x 2)     Status: None   Collection Time: 03/17/21  1:18 PM   Specimen: BLOOD  Result Value Ref Range Status   Specimen Description BLOOD LEFT ANTECUBITAL  Final   Special Requests   Final    Blood Culture adequate volume BOTTLES DRAWN AEROBIC AND ANAEROBIC   Culture   Final    NO GROWTH 5 DAYS Performed at [facility], [address] [place], [place] [address]    Report Status 03/22/2021 FINAL  Final  Culture, blood (routine x 2)     Status: None   Collection Time: 03/17/21  1:19 PM   Specimen: BLOOD LEFT HAND  Result Value Ref Range Status   Specimen Description BLOOD LEFT  HAND  Final   Special Requests   Final    Blood Culture results may not be optimal due to an inadequate volume of blood received in culture bottles BOTTLES DRAWN AEROBIC ONLY   Culture   Final    NO GROWTH 5 DAYS Performed at [facility], [address] [place], [place] [address]    Report Status 03/22/2021 FINAL  Final  Aerobic/Anaerobic Culture (surgical/deep wound)     Status: None (Preliminary result)   Collection Time: 03/19/21  2:51 PM   Specimen: Abdomen; Abdominal Fluid  Result Value Ref Range Status   Specimen Description ABDOMEN  Final   Special Requests GALLBLADDER  Final   Gram Stain NO WBC SEEN NO ORGANISMS SEEN   Final   Culture   Final    NO GROWTH 4 DAYS NO ANAEROBES ISOLATED; CULTURE IN PROGRESS FOR 5 DAYS Performed at [facility], [address] [place], [place] [address]    Report Status PENDING  Incomplete     Scheduled Meds: . Chlorhexidine Gluconate Cloth  6 each Topical Daily  . ezetimibe  10 mg Oral Daily  . feeding supplement (NEPRO CARB STEADY)  237 mL Oral TID BM  . insulin aspart  0-15 Units Subcutaneous TID WC  . insulin aspart  0-5 Units Subcutaneous QHS  . insulin glargine  20 Units Subcutaneous Daily  . metoCLOPramide (REGLAN) injection  10 mg Intravenous Q8H  . multivitamin with minerals  1 tablet Oral Daily  . sodium bicarbonate  1,300 mg Oral TID  . sodium chloride flush  3 mL Intravenous Q12H  . sodium chloride flush  5 mL Intracatheter Q8H   Continuous Infusions: . ferumoxytol Stopped (03/20/21 2015)  . piperacillin-tazobactam (ZOSYN)  IV 3.375 g (03/23/21 0947)    Procedures/Studies: CT ABDOMEN PELVIS WO CONTRAST  Result Date: 03/17/2021 CLINICAL DATA:  Nonlocalized abdominal pain. Increased lactic acid. Lethargy. Confusion. EXAM: CT ABDOMEN AND PELVIS WITHOUT CONTRAST TECHNIQUE: Multidetector CT imaging of the abdomen and pelvis was performed following the standard protocol without IV contrast. COMPARISON:  01/17/2013  FINDINGS: Lower chest: Left lower lobe 9 mm pulmonary nodule on 25/4 is similar  in 2014 and considered benign. Bibasilar subsegmental atelectasis. Mild cardiomegaly with pacer. Distal right coronary artery calcification. Small bilateral pleural effusions. Hepatobiliary: Small volume perihepatic ascites. Subtle hypoattenuation involving the anterior portion of segment 4 B, including on 28/2. On the order of 5.0 cm. 2.0 cm gallstone with gallbladder wall thickening up to 9 mm. No biliary duct dilatation. Pancreas: Fatty replacement involving the pancreatic head and uncinate process. Spleen: Normal in size, without focal abnormality. Adrenals/Urinary Tract: Normal adrenal glands. Mild renal cortical thinning bilaterally. Interpolar right renal exophytic 7.4 cm fluid density lesion with calcification in its wall. Trace air within the nondependent bladder including on 79/2. No ureteric or bladder calculi. Stomach/Bowel: Proximal gastric underdistention. Colonic stool burden suggests constipation. Normal terminal ileum and appendix. Normal small bowel. Vascular/Lymphatic: Advanced aortic and branch vessel atherosclerosis. Retroaortic left renal vein. No abdominopelvic adenopathy. Reproductive: Normal uterus and adnexa. Other: Small volume abdominopelvic fluid including adjacent the liver and spleen. Musculoskeletal: Mild anasarca. Remote eighth posterior left rib fracture. IMPRESSION: 1. Cholelithiasis with gallbladder wall thickening. Correlate with right upper quadrant symptoms and possibly ultrasound to exclude acute cholecystitis. 2.  Possible constipation. 3. Bilateral small pleural effusions and abdominopelvic ascites with anasarca. Question fluid overload. 4. Coronary artery atherosclerosis. Aortic Atherosclerosis (ICD10-I70.0). 5. Segment 4 B hypoattenuation could represent prominent focal steatosis but is indeterminate. Consider nonemergent, outpatient pre and post contrast abdominal MRI. 6. Trace air within the  bladder.  Correlate with instrumentation. Electronically Signed   By: Abigail Miyamoto M.D.   On: 03/17/2021 16:38   US Abdomen Complete  Result Date: 03/18/2021 CLINICAL DATA:  Cholelithiasis, acute renal failure EXAM: ABDOMEN ULTRASOUND COMPLETE COMPARISON:  CT 03/17/2021 FINDINGS: Gallbladder: The gallbladder is distended. Several shadowing gallstones are seen impacted within the gallbladder neck and there is layering sludge within the gallbladder. The gallbladder wall is markedly thickened measuring up to 14 mm in diameter and mild pericholecystic fluid is identified. The sonographic Percell Miller sign is reportedly positive. Common bile duct: Diameter: 4-5 mm in proximal diameter. Liver: No focal lesion identified. Within normal limits in parenchymal echogenicity. Portal vein is patent on color Doppler imaging with normal direction of blood flow towards the liver. IVC: No abnormality visualized. Pancreas: Visualized portion unremarkable. Spleen: Size and appearance within normal limits. Right Kidney: Length: 10.9 cm. Echogenicity within normal limits. No mass or hydronephrosis visualized. 8.1 cm simple cortical cyst identified within the upper pole. Left Kidney: Length: 11.0 cm. Echogenicity within normal limits. No mass or hydronephrosis visualized. Abdominal aorta: No aneurysm visualized, though the distal aorta is obscured by overlying bowel gas. Other findings: Small right pleural effusion is present. Mild perihepatic and perisplenic ascites is noted. IMPRESSION: Acute calculus cholecystitis. Mild ascites.  Small right pleural effusion. 8.1 cm right upper pole simple cortical cyst. This is best characterized as a Bosniak class 1 cyst and further follow-up is not required. Electronically Signed   By: Fidela Salisbury MD   On: 03/18/2021 12:51   US Abdomen Limited  Result Date: 03/18/2021 CLINICAL DATA:  Right upper quadrant abdominal pain EXAM: ULTRASOUND ABDOMEN LIMITED RIGHT UPPER QUADRANT COMPARISON:  CT  03/17/2021 FINDINGS: Gallbladder: The gallbladder is distended, there is marked gallbladder wall thickening, and mild pericholecystic fluid identified. The gallbladder contains layering sludge and stones. Stones are seen within the gallbladder neck, however, decubitus positioning was not performed to assess for impaction. The sonographic Percell Miller sign is reportedly positive. Common bile duct: Diameter: 4 mm in proximal diameter Liver: No focal lesion identified. Within normal limits in  parenchymal echogenicity. Portal vein is patent on color Doppler imaging with normal direction of blood flow towards the liver. Other: Mild perihepatic ascites is present. Small right pleural effusion is noted. 8.9 cm simple cortical cyst is noted within the upper pole of the right kidney. IMPRESSION: Acute calculus cholecystitis. Mild perihepatic ascites and small right pleural effusion. Electronically Signed   By: Fidela Salisbury MD   On: 03/18/2021 12:46   DG Chest Port 1 View  Result Date: 03/20/2021 CLINICAL DATA:  Hypoxia. EXAM: PORTABLE CHEST 1 VIEW COMPARISON:  January 17, 2013. FINDINGS: Stable cardiomediastinal silhouette. Mild central pulmonary vascular congestion is noted. No pneumothorax or pleural effusion is noted. No consolidative process is noted. 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 Bony thorax is unremarkable. IMPRESSION: Mild central pulmonary vascular congestion. Electronically Signed   By: Marijo Conception M.D.   On: 03/20/2021 08:55   ECHOCARDIOGRAM COMPLETE  Result Date: 03/18/2021    ECHOCARDIOGRAM REPORT   Patient Name:   Tracey Bryant Date of Exam: 03/18/2021 Medical Rec #:  [id number]       Height:       66.0 in Accession #:    [id number]      Weight:       180.8 lb Date of Birth:  02-Sep-1951       BSA:          1.916 m Patient Age:    25 years        BP:           126/51 mmHg Patient Gender: F               HR:           71 bpm. Exam Location:  [facility] Procedure: 2D Echo and  Intracardiac Opacification Agent Indications:    CHF-Acute Systolic M09.47                 SBE I33.9  History:        Patient has no prior history of Echocardiogram examinations.                 Stroke; Risk Factors:Non-Smoker, Diabetes and Hypertension.                 Rheumatoid Arthritis.  Sonographer:    Leavy Cella RDCS (AE) Referring Phys: [phone] SEYED A SHAHMEHDI IMPRESSIONS  1. Left ventricular ejection fraction, by estimation, is 25 to 30%. The left ventricle has normal function. The left ventricle demonstrates global hypokinesis. There is mild left ventricular hypertrophy. Left ventricular diastolic parameters are consistent with Grade II diastolic dysfunction (pseudonormalization). Elevated left atrial pressure.  2. RV poorly visualized, grossly appears enlarged with decreased systolic function. . Right ventricular systolic function was not well visualized. The right ventricular size is not well visualized.  3. Left atrial size was severely dilated.  4. The mitral valve is normal in structure. No evidence of mitral valve regurgitation. No evidence of mitral stenosis.  5. The aortic valve is tricuspid. There is mild calcification of the aortic valve. There is mild thickening of the aortic valve. Aortic valve regurgitation is not visualized. No aortic stenosis is present.  6. Mild pulmonary HTN, PASP is 36 mmHg.  7. The inferior vena cava is normal in size with greater than 50% respiratory variability, suggesting right atrial pressure of 3 mmHg. FINDINGS  Left Ventricle: Left ventricular ejection fraction, by estimation, is 25 to 30%. The left ventricle has normal function. The left  ventricle demonstrates global hypokinesis. The left ventricular internal cavity size was normal in size. There is mild left  ventricular hypertrophy. Left ventricular diastolic parameters are consistent with Grade II diastolic dysfunction (pseudonormalization). Elevated left atrial pressure. Right Ventricle: RV poorly  visualized, grossly appears enlarged with decreased systolic function. The right ventricular size is not well visualized. Right vetricular wall thickness was not well visualized. Right ventricular systolic function was not well  visualized. Left Atrium: Left atrial size was severely dilated. Right Atrium: Right atrial size was not well visualized. Pericardium: There is no evidence of pericardial effusion. Mitral Valve: The mitral valve is normal in structure. There is mild thickening of the mitral valve leaflet(s). There is mild calcification of the mitral valve leaflet(s). Mild mitral annular calcification. No evidence of mitral valve regurgitation. No evidence of mitral valve stenosis. Tricuspid Valve: The tricuspid valve is normal in structure. Tricuspid valve regurgitation is mild . No evidence of tricuspid stenosis. Aortic Valve: The aortic valve is tricuspid. There is mild calcification of the aortic valve. There is mild thickening of the aortic valve. There is mild aortic valve annular calcification. Aortic valve regurgitation is not visualized. No aortic stenosis  is present. Aortic valve mean gradient measures 2.3 mmHg. Aortic valve peak gradient measures 4.6 mmHg. Aortic valve area, by VTI measures 1.70 cm. Pulmonic Valve: The pulmonic valve was not well visualized. Pulmonic valve regurgitation is not visualized. No evidence of pulmonic stenosis. Aorta: The aortic root is normal in size and structure. Pulmonary Artery: Mild pulmonary HTN, PASP is 36 mmHg. Venous: The inferior vena cava is normal in size with greater than 50% respiratory variability, suggesting right atrial pressure of 3 mmHg. IAS/Shunts: No atrial level shunt detected by color flow Doppler.  LEFT VENTRICLE PLAX 2D LVIDd:         4.08 cm  Diastology LVIDs:         3.43 cm  LV e' medial:    4.79 cm/s LV PW:         1.34 cm  LV E/e' medial:  24.4 LV IVS:        1.08 cm  LV e' lateral:   7.83 cm/s LVOT diam:     1.80 cm  LV E/e' lateral: 14.9  LV SV:         33 LV SV Index:   17 LVOT Area:     2.54 cm  RIGHT VENTRICLE RV S prime:     9.90 cm/s TAPSE (M-mode): 1.2 cm LEFT ATRIUM             Index       RIGHT ATRIUM           Index LA diam:        4.60 cm 2.40 cm/m  RA Area:     12.90 cm LA Vol (A2C):   61.2 ml 31.95 ml/m RA Volume:   28.60 ml  14.93 ml/m LA Vol (A4C):   92.0 ml 48.02 ml/m LA Biplane Vol: 78.2 ml 40.82 ml/m  AORTIC VALVE AV Area (Vmax):    1.82 cm AV Area (Vmean):   1.50 cm AV Area (VTI):     1.70 cm AV Vmax:           107.72 cm/s AV Vmean:          71.452 cm/s AV VTI:            0.191 m AV Peak Grad:      4.6 mmHg AV Mean Grad:  2.3 mmHg LVOT Vmax:         77.12 cm/s LVOT Vmean:        42.238 cm/s LVOT VTI:          0.128 m LVOT/AV VTI ratio: 0.67  AORTA Ao Root diam: 2.50 cm MITRAL VALVE                TRICUSPID VALVE MV Area (PHT): 4.77 cm     TR Peak grad:   33.2 mmHg MV Decel Time: 159 msec     TR Vmax:        288.00 cm/s MV E velocity: 117.00 cm/s MV A velocity: 28.40 cm/s   SHUNTS MV E/A ratio:  4.12         Systemic VTI:  0.13 m                             Systemic Diam: 1.80 cm Carlyle Dolly MD Electronically signed by Carlyle Dolly MD Signature Date/Time: 03/18/2021/2:24:49 PM    Final    CT IMAGE GUIDED DRAINAGE BY PERCUTANEOUS CATHETER  Result Date: 03/19/2021 INDICATION: Acute cholecystitis EXAM: CT -GUIDED CHOLECYSTOSTOMY TUBE PLACEMENT COMPARISON:  03/17/2021 MEDICATIONS: The patient is currently admitted to the hospital and on intravenous antibiotics. Antibiotics were administered within an appropriate time frame prior to skin puncture. ANESTHESIA/SEDATION: Moderate (conscious) sedation was employed during this procedure. A total of Versed 1.5 mg and Fentanyl 75 mcg was administered intravenously. Moderate Sedation Time: 20 minutes. The patient's level of consciousness and vital signs were monitored continuously by radiology nursing throughout the procedure under my direct supervision. CONTRAST:  None.  FLUOROSCOPY TIME:  None. COMPLICATIONS: None immediate. PROCEDURE: Informed written consent was obtained from the patient after a discussion of the risks, benefits and alternatives to treatment. Questions regarding the procedure were encouraged and answered. A timeout was performed prior to the initiation of the procedure. The right upper abdominal quadrant was prepped and draped in the usual sterile fashion, and a sterile drape was applied covering the operative field. Maximum barrier sterile technique with sterile gowns and gloves were used for the procedure. A timeout was performed prior to the initiation of the procedure. Local anesthesia was provided with 1% lidocaine with epinephrine. Limited upper abdominal CT demonstrates a markedly dilated gallbladder. Utilizing a transhepatic approach, an 18 gauge trocar needle was advanced into the gallbladder under intermittent CT guidance. An Amplatz wire was inserted and coiled within the gallbladder. Appropriate position was confirmed with limited CT. Serial dilation was performed and ultimately a 10.2-French Cook multipurpose drainage tube was advanced into the gallbladder infundibulum, coiled and locked. Limited upper abdominal CT was then obtained to confirm adequate positioning. Bile was aspirated. The catheter was secured to the skin with suture, connected to a drainage bag and a dressing was placed. The patient tolerated the procedure well without immediate post procedural complication. IMPRESSION: Successful CT guided placement of a 10.2 French cholecystostomy tube. PLAN: Follow-up with cholecystostomy tube check and change in 2 months. Ruthann Cancer, MD [facility] Electronically Signed   By: Ruthann Cancer MD   On: 03/19/2021 16:39    Phillips Climes, MD  [facility]  If 7PM-7AM, please contact night-coverage [email or web address] 03/23/2021, 3:38 PM   LOS: 7 days

## 2021-03-23 NOTE — Progress Notes (Signed)
Pharmacist Heart Failure Core Measure Documentation  Assessment: Tracey Bryant has an EF documented as 25-30 % on 03/18/2021 by Echo.  Rationale: Heart failure patients with left ventricular systolic dysfunction (LVSD) and an EF < 40% should be prescribed an angiotensin converting enzyme inhibitor (ACEI) or angiotensin receptor blocker (ARB) at discharge unless a contraindication is documented in the medical record.  This patient is not currently on an ACEI or ARB for HF.  This note is being placed in the record in order to provide documentation that a contraindication to the use of these agents is present for this encounter.  ACE Inhibitor or Angiotensin Receptor Blocker is contraindicated (specify all that apply)  []   ACEI allergy AND ARB allergy []   Angioedema []   Moderate or severe aortic stenosis []   Hyperkalemia [x]   Hypotension []   Renal artery stenosis [x]   Worsening renal function, preexisting renal disease or dysfunction  Vinia Jemmott S. Alford Highland, PharmD, BCPS Clinical Staff Pharmacist [email or web address] [place] 03/23/2021 10:33 AM

## 2021-03-23 NOTE — Progress Notes (Signed)
Referring Physician(s): Dr Ronnie Derby  Supervising Physician: Mir, Sharen Heck  Patient Status:  [facility] - In-pt  Chief Complaint:  Perc chole drain placed in IR 03/19/21  Subjective:  Feeling better Eating better Will need follow up with Dr Serafina Royals - 8 weeks  Allergies: Patient has no known allergies.  Medications: Prior to Admission medications   Medication Sig Start Date End Date Taking? Authorizing Provider  apixaban (ELIQUIS) 5 MG TABS tablet Take 5 mg by mouth 2 (two) times daily.   Yes [provider]  ezetimibe (ZETIA) 10 MG tablet Take 10 mg by mouth daily.   Yes [provider]  furosemide (LASIX) 20 MG tablet Take 20 mg by mouth.   Yes [provider]  glipiZIDE (GLUCOTROL XL) 5 MG 24 hr tablet Take 5 mg by mouth daily with breakfast.   Yes [provider]  insulin glargine (LANTUS) 100 UNIT/ML injection Inject 50 Units into the skin at bedtime.   Yes [provider]  insulin NPH-regular (NOVOLIN 70/30) (70-30) 100 UNIT/ML injection Inject 10 Units into the skin 3 (three) times daily after meals.   Yes [provider]     Vital Signs: BP 131/66 (BP Location: Right Arm)   Pulse 69   Temp 98.2 F (36.8 C) (Oral)   Resp 20   Ht 5\' 6"  (1.676 m)   Wt 202 lb 9.6 oz (91.9 kg)   SpO2 96%   BMI 32.70 kg/m   Physical Exam Skin:    General: Skin is warm.     Comments: Site clean and dry NT no bleeding OP bile 300 cc or more daily NGTD  Flushes easily     Imaging: DG Chest Port 1 View  Result Date: 03/20/2021 CLINICAL DATA:  Hypoxia. EXAM: PORTABLE CHEST 1 VIEW COMPARISON:  January 17, 2013. FINDINGS: Stable cardiomediastinal silhouette. Mild central pulmonary vascular congestion is noted. No pneumothorax or pleural effusion is noted. No consolidative process is noted. 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 Bony thorax is unremarkable. IMPRESSION: Mild central pulmonary vascular congestion.  Electronically Signed   By: Marijo Conception M.D.   On: 03/20/2021 08:55   CT IMAGE GUIDED DRAINAGE BY PERCUTANEOUS CATHETER  Result Date: 03/19/2021 INDICATION: Acute cholecystitis EXAM: CT -GUIDED CHOLECYSTOSTOMY TUBE PLACEMENT COMPARISON:  03/17/2021 MEDICATIONS: The patient is currently admitted to the hospital and on intravenous antibiotics. Antibiotics were administered within an appropriate time frame prior to skin puncture. ANESTHESIA/SEDATION: Moderate (conscious) sedation was employed during this procedure. A total of Versed 1.5 mg and Fentanyl 75 mcg was administered intravenously. Moderate Sedation Time: 20 minutes. The patient's level of consciousness and vital signs were monitored continuously by radiology nursing throughout the procedure under my direct supervision. CONTRAST:  None. FLUOROSCOPY TIME:  None. COMPLICATIONS: None immediate. PROCEDURE: Informed written consent was obtained from the patient after a discussion of the risks, benefits and alternatives to treatment. Questions regarding the procedure were encouraged and answered. A timeout was performed prior to the initiation of the procedure. The right upper abdominal quadrant was prepped and draped in the usual sterile fashion, and a sterile drape was applied covering the operative field. Maximum barrier sterile technique with sterile gowns and gloves were used for the procedure. A timeout was performed prior to the initiation of the procedure. Local anesthesia was provided with 1% lidocaine with epinephrine. Limited upper abdominal CT demonstrates a markedly dilated gallbladder. Utilizing a transhepatic approach, an 18 gauge trocar needle was advanced into the gallbladder under  intermittent CT guidance. An Amplatz wire was inserted and coiled within the gallbladder. Appropriate position was confirmed with limited CT. Serial dilation was performed and ultimately a 10.2-French Cook multipurpose drainage tube was advanced into the  gallbladder infundibulum, coiled and locked. Limited upper abdominal CT was then obtained to confirm adequate positioning. Bile was aspirated. The catheter was secured to the skin with suture, connected to a drainage bag and a dressing was placed. The patient tolerated the procedure well without immediate post procedural complication. IMPRESSION: Successful CT guided placement of a 10.2 French cholecystostomy tube. PLAN: Follow-up with cholecystostomy tube check and change in 2 months. Ruthann Cancer, MD [facility] Radiology Electronically Signed   By: Ruthann Cancer MD   On: 03/19/2021 16:39    Labs:  CBC: Recent Labs    03/20/21 0147 03/21/21 0115 03/22/21 0219 03/23/21 0231  WBC 37.0* 32.3* 22.6* 23.5*  HGB 6.1* 6.6* 6.2* 6.8*  HCT 19.0* 20.6* 20.0* 22.0*  PLT 270 129* 266 270    COAGS: Recent Labs    03/18/21 0630 03/19/21 0504 03/20/21 0147 03/21/21 0115  INR 1.6* 1.4* 1.6* 1.5*    BMP: Recent Labs    03/20/21 0147 03/21/21 0115 03/22/21 0219 03/23/21 0231  NA 131* 131* 131* 135  K 4.0 4.3 3.4* 3.4*  CL 106 105 103 107  CO2 15* 12* 15* 17*  GLUCOSE 146* 169* 256* 159*  BUN 90* 92* 88* 79*  CALCIUM 6.6* 6.7* 6.6* 6.7*  CREATININE 3.96* 3.99* 3.83* 3.25*  GFRNONAA 12* 12* 12* 15*    LIVER FUNCTION TESTS: Recent Labs    03/20/21 0147 03/21/21 0115 03/22/21 0219 03/23/21 0231  BILITOT 0.6 1.2 0.7 0.9  AST 317* 338* 193* 106*  ALT 723* 847* 652* 537*  ALKPHOS 50 76 59 53  PROT 5.2* 5.7* 5.4* 5.6*  ALBUMIN 2.2* 2.3* 2.0* 2.0*    Assessment and Plan:  Percutaneous cholecystostomy drain placed in IR 3/24 Need follow up with Dr Serafina Royals 8 weeks Pt will hear from IR scheduler with time and date OP orders in place Will need Rx for flushes-- saline daily 5-10 cc  Record OP daily  Electronically Signed: Lavonia Drafts, PA-C 03/23/2021, 10:30 AM   I spent a total of 15 Minutes at the the patient's bedside  AND on the patient's hospital floor or unit, greater than 50% of which was counseling/coordinating care for perc chole drain

## 2021-03-24 DIAGNOSIS — K810 Acute cholecystitis: Secondary | ICD-10-CM | POA: Diagnosis not present

## 2021-03-24 DIAGNOSIS — E0810 Diabetes mellitus due to underlying condition with ketoacidosis without coma: Secondary | ICD-10-CM | POA: Diagnosis not present

## 2021-03-24 DIAGNOSIS — D62 Acute posthemorrhagic anemia: Secondary | ICD-10-CM | POA: Diagnosis not present

## 2021-03-24 DIAGNOSIS — N390 Urinary tract infection, site not specified: Secondary | ICD-10-CM | POA: Diagnosis not present

## 2021-03-24 LAB — AEROBIC/ANAEROBIC CULTURE W GRAM STAIN (SURGICAL/DEEP WOUND)
Culture: NO GROWTH
Gram Stain: NONE SEEN

## 2021-03-24 LAB — COMPREHENSIVE METABOLIC PANEL
ALT: 370 U/L — ABNORMAL HIGH (ref 0–44)
AST: 62 U/L — ABNORMAL HIGH (ref 15–41)
Albumin: 1.9 g/dL — ABNORMAL LOW (ref 3.5–5.0)
Alkaline Phosphatase: 47 U/L (ref 38–126)
Anion gap: 11 (ref 5–15)
BUN: 73 mg/dL — ABNORMAL HIGH (ref 8–23)
CO2: 17 mmol/L — ABNORMAL LOW (ref 22–32)
Calcium: 6.9 mg/dL — ABNORMAL LOW (ref 8.9–10.3)
Chloride: 104 mmol/L (ref 98–111)
Creatinine, Ser: 2.88 mg/dL — ABNORMAL HIGH (ref 0.44–1.00)
GFR, Estimated: 17 mL/min — ABNORMAL LOW (ref >60)
Glucose, Bld: 143 mg/dL — ABNORMAL HIGH (ref 70–99)
Potassium: 3.7 mmol/L (ref 3.5–5.1)
Sodium: 132 mmol/L — ABNORMAL LOW (ref 135–145)
Total Bilirubin: 1 mg/dL (ref 0.3–1.2)
Total Protein: 5.5 g/dL — ABNORMAL LOW (ref 6.5–8.1)

## 2021-03-24 LAB — CBC
HCT: 21.1 % — ABNORMAL LOW (ref 36.0–46.0)
Hemoglobin: 6.4 g/dL — CL (ref 12.0–15.0)
MCH: 29.5 pg (ref 26.0–34.0)
MCHC: 30.3 g/dL (ref 30.0–36.0)
MCV: 97.2 fL (ref 80.0–100.0)
Platelets: 298 10*3/uL (ref 150–400)
RBC: 2.17 MIL/uL — ABNORMAL LOW (ref 3.87–5.11)
RDW: 20.3 % — ABNORMAL HIGH (ref 11.5–15.5)
WBC: 19.3 10*3/uL — ABNORMAL HIGH (ref 4.0–10.5)
nRBC: 0.6 % — ABNORMAL HIGH (ref 0.0–0.2)

## 2021-03-24 LAB — GLUCOSE, CAPILLARY
Glucose-Capillary: 102 mg/dL — ABNORMAL HIGH (ref 70–99)
Glucose-Capillary: 212 mg/dL — ABNORMAL HIGH (ref 70–99)
Glucose-Capillary: 225 mg/dL — ABNORMAL HIGH (ref 70–99)
Glucose-Capillary: 263 mg/dL — ABNORMAL HIGH (ref 70–99)

## 2021-03-24 MED ORDER — GLUCERNA SHAKE PO LIQD: 237.0000 mL | Freq: Three times a day (TID) | ORAL | Status: DC

## 2021-03-24 NOTE — Progress Notes (Signed)
PROGRESS NOTE  Tracey Bryant DPO:[id number] DOB: 04-15-1951 DOA: 03/16/2021 PCP: Coolidge Breeze, FNP  Brief History:   70 year old female with a history of diabetes mellitus type 2, cardiomyopathy, permanent pacemaker, A. Fib on Eliquis, hypertension, stroke presenting with 2-day history of nausea, vomiting, and generalized weakness.    And abdominal pain, she had ED visit due to epistaxis, treated with Afrin and cotton ball with some improvement, patient has another ED visit secondary to DKA, as well she was also noted to be anemic.  She is a Restaurant manager, fast food.  CT of the abdomen and pelvis showed small perihepatic ascites, cholelithiasis with gallbladder wall thickening without biliary ductal dilatation.  RUQ [place] confirmed acute calculous cholecystitis.  General surgery at [facility] altered, undetermined patient is high risk for surgery given her cardiomyopathy and anemia, patient was transferred to [facility] 3/24 for percutaneous gallbladder drain by IR , as well hospital stay was noted for worsening anemia, worsening renal failure .  Subjective:  Patient reports she is feeling better today, appetite has improved, denies any dyspnea, no fever or chills.  .   Assessment/Plan:  DKA type II -she has been on insulin drip, anion gap has closed, she is transitioned to subcutaneous insulin treatment. -CBG controlled on current regimen of 20 units of Lantus and sliding scale. -HbA1C--9.1  Severe Sepsis due to acute cholecystitis and UTI -present on admission -had hypotension, leukocytosis, elevated lactate, tachycardia -due to UTI and cholecystitis -Urine culture growing pansensitive E. Coli. -He does not want any ER remain negative -continue Zosyn -Received IV fluids initially. -Tachycardia has stabilized leukocytosis  most likely related to her receiving Granix on admission   Acute cholecystitis -3/23 CT abd as above -03/18/21 abd US-GB wall thickened with  impacted stones in GB neck; no hydronephrosis -concerned about cholecystitis -general surgery( Dr Wynonia Sours in [place]) consulted>>poor surgical candidate>>IR placed percutaneous gallbladder drain 3/24. -Drain care by IR.  Still with significant output.   -Continue with IV Zosyn.  Transaminitis -GI input greatly appreciated, trending down   Leukemoid reaction -pt received Granix on 03/17/21 at 1700  UTI-GNR -E. coli, pansensitive, treated with Zosyn  AKI on CKD -baseline creatinine ~1.6-1.7, trending up, creatinine peaked up to around 4 levels, appears to be plateaued for last couple days . -  renal input greatly appreciated, will hold on diuresis given evaded creatinine . -Continue with oral bicarb, globin improved to 17 today. -Creatinine has been improving, it is 2.8 today.  Urinary retention -Noted with 800 cc urinary retention, Foley catheter inserted 3/26.  Anemia, multifactorial due to anemia of acute blood loss and anemia of chronic kidney disease -Secondary to epistaxis, CKD -apixaban on hold -FOBT negative x2, then positive, this is likely due to epistaxis, significant GI -03/18/21 FOBT Positive AFTER patient started on oral iron -Patient is Jehovah's Witness, she dosen't want any PRBC. -Iron saturation 14, ferritin 99 -Received Aranesp and IV iron.  History of atrial fibrillation -Eliquis on hold in the setting of profound anemia, and history of epistaxis, she is Jehovah's Witness  cannot give any PRBC transfusion.  At this point risks outweigh benefits to start her back on full anticoagulation.  Chronic systolic and diastolic CHF -4/31/54 Echo EF 25-30%, G2DD, global HK -While discussing medicine, patient with known history of A. fib, on Eliquis acute CVA in the past, she required permanent pacemaker insertion around 8 months ago at [facility], but she did not follow with  any cardiologist in outpatient setting yet. -Patient remains soft, no Coreg or Entresto. -She is  with evidence of some mild volume overload, but she remains on room air hold on IV diuresis especially with her renal failure.  History of stroke -Apixaban on hold secondary to acute blood loss anemia  Hyperlipidemia -Continue Zetia  Hyperkalemia -lokelma x 1 -improved  Thrombocytopenia -improved  Hyponatremia -Most likely due to the excess volume, monitor  Status is: Inpatient  Remains inpatient appropriate because:Hemodynamically unstable, Unsafe d/c plan and IV treatments appropriate due to intensity of illness or inability to take PO   Dispo: The patient is from: Home  Anticipated d/c is to: Home  Patient currently is not medically stable to d/c.              Difficult to place patient No        Family Communication:  son updated by phone daily  Consultants:  General surgery  Code Status:  FULL   DVT Prophylaxis:  SCDs   Procedures: As Listed in Progress Note Above  Antibiotics: Zosyn 3/22>>       Objective: Vitals:   03/24/21 0009 03/24/21 0404 03/24/21 0611 03/24/21 1408  BP: 123/60 121/72  127/61  Pulse: 72 67  73  Resp: 16 17  14   Temp: 97.8 F (36.6 C)  97.7 F (36.5 C) 98.3 F (36.8 C)  TempSrc: Axillary  Oral Oral  SpO2: 96% 92%  97%  Weight:  100 kg    Height:        Intake/Output Summary (Last 24 hours) at 03/24/2021 1522 Last data filed at 03/24/2021 1339 Gross per 24 hour  Intake 1015 ml  Output 1110 ml  Net -95 ml   Weight change:  Exam:  Awake Alert, Oriented X 3,frail, chronically ill-appearing no new F.N deficits, Normal affect Symmetrical Chest wall movement, Good air movement bilaterally, CTAB RRR,No Gallops,Rubs or new Murmurs, No Parasternal Heave +ve B.Sounds, Abd Soft, No tenderness, right upper quadrant drain, no rebound - guarding or rigidity. No Cyanosis, Clubbing ,+1 edema, No new Rash or bruise    Data Reviewed: I have personally reviewed following labs and  imaging studies Basic Metabolic Panel: Recent Labs  Lab 03/20/21 0147 03/21/21 0115 03/22/21 0219 03/23/21 0231 03/24/21 0113  NA 131* 131* 131* 135 132*  K 4.0 4.3 3.4* 3.4* 3.7  CL 106 105 103 107 104  CO2 15* 12* 15* 17* 17*  GLUCOSE 146* 169* 256* 159* 143*  BUN 90* 92* 88* 79* 73*  CREATININE 3.96* 3.99* 3.83* 3.25* 2.88*  CALCIUM 6.6* 6.7* 6.6* 6.7* 6.9*  MG  --   --   --  2.4  --    Liver Function Tests: Recent Labs  Lab 03/20/21 0147 03/21/21 0115 03/22/21 0219 03/23/21 0231 03/24/21 0113  AST 317* 338* 193* 106* 62*  ALT 723* 847* 652* 537* 370*  ALKPHOS 50 76 59 53 47  BILITOT 0.6 1.2 0.7 0.9 1.0  PROT 5.2* 5.7* 5.4* 5.6* 5.5*  ALBUMIN 2.2* 2.3* 2.0* 2.0* 1.9*   No results for input(s): LIPASE, AMYLASE in the last 168 hours. No results for input(s): AMMONIA in the last 168 hours. Coagulation Profile: Recent Labs  Lab 03/18/21 0630 03/19/21 0504 03/20/21 0147 03/21/21 0115  INR 1.6* 1.4* 1.6* 1.5*   CBC: Recent Labs  Lab 03/20/21 0147 03/21/21 0115 03/22/21 0219 03/23/21 0231 03/24/21 0113  WBC 37.0* 32.3* 22.6* 23.5* 19.3*  HGB 6.1* 6.6* 6.2* 6.8* 6.4*  HCT 19.0* 20.6*  20.0* 22.0* 21.1*  MCV 92.7 94.1 93.0 95.7 97.2  PLT 270 129* 266 270 298   Cardiac Enzymes: No results for input(s): CKTOTAL, CKMB, CKMBINDEX, TROPONINI in the last 168 hours. BNP: Invalid input(s): POCBNP CBG: Recent Labs  Lab 03/23/21 1150 03/23/21 1742 03/23/21 1951 03/24/21 0750 03/24/21 1204  GLUCAP 108* 152* 204* 102* 212*   HbA1C: No results for input(s): HGBA1C in the last 72 hours. Urine analysis:    Component Value Date/Time   COLORURINE AMBER (A) 03/17/2021 1208   APPEARANCEUR CLOUDY (A) 03/17/2021 1208   LABSPEC 1.015 03/17/2021 1208   PHURINE 5.0 03/17/2021 1208   GLUCOSEU 50 (A) 03/17/2021 1208   HGBUR SMALL (A) 03/17/2021 1208   BILIRUBINUR NEGATIVE 03/17/2021 [place] 03/17/2021 1208   PROTEINUR >=300 (A) 03/17/2021 1208    UROBILINOGEN 0.2 01/17/2013 1910   NITRITE NEGATIVE 03/17/2021 1208   LEUKOCYTESUR LARGE (A) 03/17/2021 1208   Sepsis Labs: @LABRCNTIP (procalcitonin:4,lacticidven:4) ) Recent Results (from the past 240 hour(s))  SARS CORONAVIRUS 2 (TAT 6-24 HRS) Nasopharyngeal Nasopharyngeal Swab     Status: None   Collection Time: 03/16/21  1:32 PM   Specimen: Nasopharyngeal Swab  Result Value Ref Range Status   SARS Coronavirus 2 NEGATIVE NEGATIVE Final    Comment: (NOTE) SARS-CoV-2 target nucleic acids are NOT DETECTED.  The SARS-CoV-2 RNA is generally detectable in upper and lower respiratory specimens during the acute phase of infection. Negative results do not preclude SARS-CoV-2 infection, do not rule out co-infections with other pathogens, and should not be used as the sole basis for treatment or other patient management decisions. Negative results must be combined with clinical observations, patient history, and epidemiological information. The expected result is Negative.  Fact Sheet for Patients: SugarRoll.be  Fact Sheet for Healthcare Providers: https://www.woods-mathews.com/  This test is not yet approved or cleared by the [place] FDA and  has been authorized for detection and/or diagnosis of SARS-CoV-2 by FDA under an Emergency Use Authorization (EUA). This EUA will remain  in effect (meaning this test can be used) for the duration of the COVID-19 declaration under Se ction 564(b)(1) of the Act, 21 U.S.C. section 360bbb-3(b)(1), unless the authorization is terminated or revoked sooner.  Performed at [facility], [place] [address], [place], [place] [address]   MRSA PCR Screening     Status: None   Collection Time: 03/16/21  5:06 PM   Specimen: Nasal Mucosa; Nasopharyngeal  Result Value Ref Range Status   MRSA by PCR NEGATIVE NEGATIVE Final    Comment:        The GeneXpert MRSA Assay (FDA approved for NASAL  specimens only), is one component of a comprehensive MRSA colonization surveillance program. It is not intended to diagnose MRSA infection nor to guide or monitor treatment for MRSA infections. Performed at [facility], [address], [place], [place] [address]   Culture, Urine     Status: Abnormal   Collection Time: 03/17/21 12:08 PM   Specimen: Urine, Clean Catch  Result Value Ref Range Status   Specimen Description   Final    URINE, CLEAN CATCH Performed at [facility], [address] [place], [place] [address]    Special Requests   Final    NONE Performed at [facility], [address] [place], [place] [address]    Culture >=100,000 COLONIES/mL ESCHERICHIA COLI (A)  Final   Report Status 03/19/2021 FINAL  Final   Organism ID, Bacteria ESCHERICHIA COLI (A)  Final  Susceptibility   Escherichia coli - MIC*    AMPICILLIN 4 SENSITIVE Sensitive     CEFAZOLIN <=4 SENSITIVE Sensitive     CEFEPIME <=0.12 SENSITIVE Sensitive     CEFTRIAXONE <=0.25 SENSITIVE Sensitive     CIPROFLOXACIN <=0.25 SENSITIVE Sensitive     GENTAMICIN <=1 SENSITIVE Sensitive     IMIPENEM <=0.25 SENSITIVE Sensitive     NITROFURANTOIN <=16 SENSITIVE Sensitive     TRIMETH/SULFA <=20 SENSITIVE Sensitive     AMPICILLIN/SULBACTAM <=2 SENSITIVE Sensitive     PIP/TAZO <=4 SENSITIVE Sensitive     * >=100,000 COLONIES/mL ESCHERICHIA COLI  Culture, blood (routine x 2)     Status: None   Collection Time: 03/17/21  1:18 PM   Specimen: BLOOD  Result Value Ref Range Status   Specimen Description BLOOD LEFT ANTECUBITAL  Final   Special Requests   Final    Blood Culture adequate volume BOTTLES DRAWN AEROBIC AND ANAEROBIC   Culture   Final    NO GROWTH 5 DAYS Performed at [facility], [address] [place], [place] [address]    Report Status 03/22/2021 FINAL  Final  Culture, blood (routine x 2)     Status: None   Collection Time: 03/17/21  1:19 PM   Specimen: BLOOD LEFT HAND  Result Value Ref Range  Status   Specimen Description BLOOD LEFT HAND  Final   Special Requests   Final    Blood Culture results may not be optimal due to an inadequate volume of blood received in culture bottles BOTTLES DRAWN AEROBIC ONLY   Culture   Final    NO GROWTH 5 DAYS Performed at [facility], [address] [place], [place] [address]    Report Status 03/22/2021 FINAL  Final  Aerobic/Anaerobic Culture (surgical/deep wound)     Status: None   Collection Time: 03/19/21  2:51 PM   Specimen: Abdomen; Abdominal Fluid  Result Value Ref Range Status   Specimen Description ABDOMEN  Final   Special Requests GALLBLADDER  Final   Gram Stain NO WBC SEEN NO ORGANISMS SEEN   Final   Culture   Final    No growth aerobically or anaerobically. Performed at [facility], [place] [address], [place], [place] [address]    Report Status 03/24/2021 FINAL  Final     Scheduled Meds: . Chlorhexidine Gluconate Cloth  6 each Topical Daily  . ezetimibe  10 mg Oral Daily  . feeding supplement (NEPRO CARB STEADY)  237 mL Oral TID BM  . insulin aspart  0-15 Units Subcutaneous TID WC  . insulin aspart  0-5 Units Subcutaneous QHS  . insulin glargine  20 Units Subcutaneous Daily  . metoCLOPramide (REGLAN) injection  10 mg Intravenous Q8H  . multivitamin with minerals  1 tablet Oral Daily  . sodium bicarbonate  1,300 mg Oral TID  . sodium chloride flush  3 mL Intravenous Q12H  . sodium chloride flush  5 mL Intracatheter Q8H   Continuous Infusions: . ferumoxytol Stopped (03/20/21 2015)  . piperacillin-tazobactam (ZOSYN)  IV 3.375 g (03/24/21 0847)    Procedures/Studies: CT ABDOMEN PELVIS WO CONTRAST  Result Date: 03/17/2021 CLINICAL DATA:  Nonlocalized abdominal pain. Increased lactic acid. Lethargy. Confusion. EXAM: CT ABDOMEN AND PELVIS WITHOUT CONTRAST TECHNIQUE: Multidetector CT imaging of the abdomen and pelvis was performed following the standard protocol without IV contrast. COMPARISON:  01/17/2013 FINDINGS:  Lower chest: Left lower lobe 9 mm pulmonary nodule on 25/4 is similar in 2014 and considered benign. Bibasilar  subsegmental atelectasis. Mild cardiomegaly with pacer. Distal right coronary artery calcification. Small bilateral pleural effusions. Hepatobiliary: Small volume perihepatic ascites. Subtle hypoattenuation involving the anterior portion of segment 4 B, including on 28/2. On the order of 5.0 cm. 2.0 cm gallstone with gallbladder wall thickening up to 9 mm. No biliary duct dilatation. Pancreas: Fatty replacement involving the pancreatic head and uncinate process. Spleen: Normal in size, without focal abnormality. Adrenals/Urinary Tract: Normal adrenal glands. Mild renal cortical thinning bilaterally. Interpolar right renal exophytic 7.4 cm fluid density lesion with calcification in its wall. Trace air within the nondependent bladder including on 79/2. No ureteric or bladder calculi. Stomach/Bowel: Proximal gastric underdistention. Colonic stool burden suggests constipation. Normal terminal ileum and appendix. Normal small bowel. Vascular/Lymphatic: Advanced aortic and branch vessel atherosclerosis. Retroaortic left renal vein. No abdominopelvic adenopathy. Reproductive: Normal uterus and adnexa. Other: Small volume abdominopelvic fluid including adjacent the liver and spleen. Musculoskeletal: Mild anasarca. Remote eighth posterior left rib fracture. IMPRESSION: 1. Cholelithiasis with gallbladder wall thickening. Correlate with right upper quadrant symptoms and possibly ultrasound to exclude acute cholecystitis. 2.  Possible constipation. 3. Bilateral small pleural effusions and abdominopelvic ascites with anasarca. Question fluid overload. 4. Coronary artery atherosclerosis. Aortic Atherosclerosis (ICD10-I70.0). 5. Segment 4 B hypoattenuation could represent prominent focal steatosis but is indeterminate. Consider nonemergent, outpatient pre and post contrast abdominal MRI. 6. Trace air within the bladder.   Correlate with instrumentation. Electronically Signed   By: Abigail Miyamoto M.D.   On: 03/17/2021 16:38   US Abdomen Complete  Result Date: 03/18/2021 CLINICAL DATA:  Cholelithiasis, acute renal failure EXAM: ABDOMEN ULTRASOUND COMPLETE COMPARISON:  CT 03/17/2021 FINDINGS: Gallbladder: The gallbladder is distended. Several shadowing gallstones are seen impacted within the gallbladder neck and there is layering sludge within the gallbladder. The gallbladder wall is markedly thickened measuring up to 14 mm in diameter and mild pericholecystic fluid is identified. The sonographic Percell Miller sign is reportedly positive. Common bile duct: Diameter: 4-5 mm in proximal diameter. Liver: No focal lesion identified. Within normal limits in parenchymal echogenicity. Portal vein is patent on color Doppler imaging with normal direction of blood flow towards the liver. IVC: No abnormality visualized. Pancreas: Visualized portion unremarkable. Spleen: Size and appearance within normal limits. Right Kidney: Length: 10.9 cm. Echogenicity within normal limits. No mass or hydronephrosis visualized. 8.1 cm simple cortical cyst identified within the upper pole. Left Kidney: Length: 11.0 cm. Echogenicity within normal limits. No mass or hydronephrosis visualized. Abdominal aorta: No aneurysm visualized, though the distal aorta is obscured by overlying bowel gas. Other findings: Small right pleural effusion is present. Mild perihepatic and perisplenic ascites is noted. IMPRESSION: Acute calculus cholecystitis. Mild ascites.  Small right pleural effusion. 8.1 cm right upper pole simple cortical cyst. This is best characterized as a Bosniak class 1 cyst and further follow-up is not required. Electronically Signed   By: Fidela Salisbury MD   On: 03/18/2021 12:51   US Abdomen Limited  Result Date: 03/18/2021 CLINICAL DATA:  Right upper quadrant abdominal pain EXAM: ULTRASOUND ABDOMEN LIMITED RIGHT UPPER QUADRANT COMPARISON:  CT 03/17/2021  FINDINGS: Gallbladder: The gallbladder is distended, there is marked gallbladder wall thickening, and mild pericholecystic fluid identified. The gallbladder contains layering sludge and stones. Stones are seen within the gallbladder neck, however, decubitus positioning was not performed to assess for impaction. The sonographic Percell Miller sign is reportedly positive. Common bile duct: Diameter: 4 mm in proximal diameter Liver: No focal lesion identified. Within normal limits in parenchymal echogenicity. Portal vein is patent  on color Doppler imaging with normal direction of blood flow towards the liver. Other: Mild perihepatic ascites is present. Small right pleural effusion is noted. 8.9 cm simple cortical cyst is noted within the upper pole of the right kidney. IMPRESSION: Acute calculus cholecystitis. Mild perihepatic ascites and small right pleural effusion. Electronically Signed   By: Fidela Salisbury MD   On: 03/18/2021 12:46   DG Chest Port 1 View  Result Date: 03/20/2021 CLINICAL DATA:  Hypoxia. EXAM: PORTABLE CHEST 1 VIEW COMPARISON:  January 17, 2013. FINDINGS: Stable cardiomediastinal silhouette. Mild central pulmonary vascular congestion is noted. No pneumothorax or pleural effusion is noted. No consolidative process is noted. 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 Bony thorax is unremarkable. IMPRESSION: Mild central pulmonary vascular congestion. Electronically Signed   By: Marijo Conception M.D.   On: 03/20/2021 08:55   ECHOCARDIOGRAM COMPLETE  Result Date: 03/18/2021    ECHOCARDIOGRAM REPORT   Patient Name:   Tracey Bryant Date of Exam: 03/18/2021 Medical Rec #:  [id number]       Height:       66.0 in Accession #:    [id number]      Weight:       180.8 lb Date of Birth:  [birth year]       BSA:          1.916 m Patient Age:    35 years        BP:           126/51 mmHg Patient Gender: F               HR:           71 bpm. Exam Location:  [facility] Procedure: 2D Echo and Intracardiac  Opacification Agent Indications:    CHF-Acute Systolic J67.34                 SBE I33.9  History:        Patient has no prior history of Echocardiogram examinations.                 Stroke; Risk Factors:Non-Smoker, Diabetes and Hypertension.                 Rheumatoid Arthritis.  Sonographer:    Leavy Cella RDCS (AE) Referring Phys: [phone] SEYED A SHAHMEHDI IMPRESSIONS  1. Left ventricular ejection fraction, by estimation, is 25 to 30%. The left ventricle has normal function. The left ventricle demonstrates global hypokinesis. There is mild left ventricular hypertrophy. Left ventricular diastolic parameters are consistent with Grade II diastolic dysfunction (pseudonormalization). Elevated left atrial pressure.  2. RV poorly visualized, grossly appears enlarged with decreased systolic function. . Right ventricular systolic function was not well visualized. The right ventricular size is not well visualized.  3. Left atrial size was severely dilated.  4. The mitral valve is normal in structure. No evidence of mitral valve regurgitation. No evidence of mitral stenosis.  5. The aortic valve is tricuspid. There is mild calcification of the aortic valve. There is mild thickening of the aortic valve. Aortic valve regurgitation is not visualized. No aortic stenosis is present.  6. Mild pulmonary HTN, PASP is 36 mmHg.  7. The inferior vena cava is normal in size with greater than 50% respiratory variability, suggesting right atrial pressure of 3 mmHg. FINDINGS  Left Ventricle: Left ventricular ejection fraction, by estimation, is 25 to 30%. The left ventricle has normal function. The left ventricle demonstrates global hypokinesis. The left  ventricular internal cavity size was normal in size. There is mild left  ventricular hypertrophy. Left ventricular diastolic parameters are consistent with Grade II diastolic dysfunction (pseudonormalization). Elevated left atrial pressure. Right Ventricle: RV poorly visualized, grossly  appears enlarged with decreased systolic function. The right ventricular size is not well visualized. Right vetricular wall thickness was not well visualized. Right ventricular systolic function was not well  visualized. Left Atrium: Left atrial size was severely dilated. Right Atrium: Right atrial size was not well visualized. Pericardium: There is no evidence of pericardial effusion. Mitral Valve: The mitral valve is normal in structure. There is mild thickening of the mitral valve leaflet(s). There is mild calcification of the mitral valve leaflet(s). Mild mitral annular calcification. No evidence of mitral valve regurgitation. No evidence of mitral valve stenosis. Tricuspid Valve: The tricuspid valve is normal in structure. Tricuspid valve regurgitation is mild . No evidence of tricuspid stenosis. Aortic Valve: The aortic valve is tricuspid. There is mild calcification of the aortic valve. There is mild thickening of the aortic valve. There is mild aortic valve annular calcification. Aortic valve regurgitation is not visualized. No aortic stenosis  is present. Aortic valve mean gradient measures 2.3 mmHg. Aortic valve peak gradient measures 4.6 mmHg. Aortic valve area, by VTI measures 1.70 cm. Pulmonic Valve: The pulmonic valve was not well visualized. Pulmonic valve regurgitation is not visualized. No evidence of pulmonic stenosis. Aorta: The aortic root is normal in size and structure. Pulmonary Artery: Mild pulmonary HTN, PASP is 36 mmHg. Venous: The inferior vena cava is normal in size with greater than 50% respiratory variability, suggesting right atrial pressure of 3 mmHg. IAS/Shunts: No atrial level shunt detected by color flow Doppler.  LEFT VENTRICLE PLAX 2D LVIDd:         4.08 cm  Diastology LVIDs:         3.43 cm  LV e' medial:    4.79 cm/s LV PW:         1.34 cm  LV E/e' medial:  24.4 LV IVS:        1.08 cm  LV e' lateral:   7.83 cm/s LVOT diam:     1.80 cm  LV E/e' lateral: 14.9 LV SV:         33  LV SV Index:   17 LVOT Area:     2.54 cm  RIGHT VENTRICLE RV S prime:     9.90 cm/s TAPSE (M-mode): 1.2 cm LEFT ATRIUM             Index       RIGHT ATRIUM           Index LA diam:        4.60 cm 2.40 cm/m  RA Area:     12.90 cm LA Vol (A2C):   61.2 ml 31.95 ml/m RA Volume:   28.60 ml  14.93 ml/m LA Vol (A4C):   92.0 ml 48.02 ml/m LA Biplane Vol: 78.2 ml 40.82 ml/m  AORTIC VALVE AV Area (Vmax):    1.82 cm AV Area (Vmean):   1.50 cm AV Area (VTI):     1.70 cm AV Vmax:           107.72 cm/s AV Vmean:          71.452 cm/s AV VTI:            0.191 m AV Peak Grad:      4.6 mmHg AV Mean Grad:      2.3  mmHg LVOT Vmax:         77.12 cm/s LVOT Vmean:        42.238 cm/s LVOT VTI:          0.128 m LVOT/AV VTI ratio: 0.67  AORTA Ao Root diam: 2.50 cm MITRAL VALVE                TRICUSPID VALVE MV Area (PHT): 4.77 cm     TR Peak grad:   33.2 mmHg MV Decel Time: 159 msec     TR Vmax:        288.00 cm/s MV E velocity: 117.00 cm/s MV A velocity: 28.40 cm/s   SHUNTS MV E/A ratio:  4.12         Systemic VTI:  0.13 m                             Systemic Diam: 1.80 cm Carlyle Dolly MD Electronically signed by Carlyle Dolly MD Signature Date/Time: 03/18/2021/2:24:49 PM    Final    CT IMAGE GUIDED DRAINAGE BY PERCUTANEOUS CATHETER  Result Date: 03/19/2021 INDICATION: Acute cholecystitis EXAM: CT -GUIDED CHOLECYSTOSTOMY TUBE PLACEMENT COMPARISON:  03/17/2021 MEDICATIONS: The patient is currently admitted to the hospital and on intravenous antibiotics. Antibiotics were administered within an appropriate time frame prior to skin puncture. ANESTHESIA/SEDATION: Moderate (conscious) sedation was employed during this procedure. A total of Versed 1.5 mg and Fentanyl 75 mcg was administered intravenously. Moderate Sedation Time: 20 minutes. The patient's level of consciousness and vital signs were monitored continuously by radiology nursing throughout the procedure under my direct supervision. CONTRAST:  None. FLUOROSCOPY TIME:   None. COMPLICATIONS: None immediate. PROCEDURE: Informed written consent was obtained from the patient after a discussion of the risks, benefits and alternatives to treatment. Questions regarding the procedure were encouraged and answered. A timeout was performed prior to the initiation of the procedure. The right upper abdominal quadrant was prepped and draped in the usual sterile fashion, and a sterile drape was applied covering the operative field. Maximum barrier sterile technique with sterile gowns and gloves were used for the procedure. A timeout was performed prior to the initiation of the procedure. Local anesthesia was provided with 1% lidocaine with epinephrine. Limited upper abdominal CT demonstrates a markedly dilated gallbladder. Utilizing a transhepatic approach, an 18 gauge trocar needle was advanced into the gallbladder under intermittent CT guidance. An Amplatz wire was inserted and coiled within the gallbladder. Appropriate position was confirmed with limited CT. Serial dilation was performed and ultimately a 10.2-French Cook multipurpose drainage tube was advanced into the gallbladder infundibulum, coiled and locked. Limited upper abdominal CT was then obtained to confirm adequate positioning. Bile was aspirated. The catheter was secured to the skin with suture, connected to a drainage bag and a dressing was placed. The patient tolerated the procedure well without immediate post procedural complication. IMPRESSION: Successful CT guided placement of a 10.2 French cholecystostomy tube. PLAN: Follow-up with cholecystostomy tube check and change in 2 months. Ruthann Cancer, MD [facility] Rad[facility] Electronically Signed   By: Ruthann Cancer MD   On: 03/19/2021 16:39    Phillips Climes, MD  [facility]  If 7PM-7AM, please contact night-coverage [email or web address] 03/24/2021, 3:22 PM   LOS: 8 days

## 2021-03-25 DIAGNOSIS — N390 Urinary tract infection, site not specified: Secondary | ICD-10-CM | POA: Diagnosis not present

## 2021-03-25 DIAGNOSIS — E0810 Diabetes mellitus due to underlying condition with ketoacidosis without coma: Secondary | ICD-10-CM | POA: Diagnosis not present

## 2021-03-25 DIAGNOSIS — K810 Acute cholecystitis: Secondary | ICD-10-CM | POA: Diagnosis not present

## 2021-03-25 DIAGNOSIS — I5021 Acute systolic (congestive) heart failure: Secondary | ICD-10-CM | POA: Diagnosis not present

## 2021-03-25 LAB — COMPREHENSIVE METABOLIC PANEL
ALT: 272 U/L — ABNORMAL HIGH (ref 0–44)
AST: 34 U/L (ref 15–41)
Albumin: 1.9 g/dL — ABNORMAL LOW (ref 3.5–5.0)
Alkaline Phosphatase: 45 U/L (ref 38–126)
Anion gap: 11 (ref 5–15)
BUN: 70 mg/dL — ABNORMAL HIGH (ref 8–23)
CO2: 17 mmol/L — ABNORMAL LOW (ref 22–32)
Calcium: 7.1 mg/dL — ABNORMAL LOW (ref 8.9–10.3)
Chloride: 101 mmol/L (ref 98–111)
Creatinine, Ser: 3.23 mg/dL — ABNORMAL HIGH (ref 0.44–1.00)
GFR, Estimated: 15 mL/min — ABNORMAL LOW (ref >60)
Glucose, Bld: 190 mg/dL — ABNORMAL HIGH (ref 70–99)
Potassium: 3.8 mmol/L (ref 3.5–5.1)
Sodium: 129 mmol/L — ABNORMAL LOW (ref 135–145)
Total Bilirubin: 1.1 mg/dL (ref 0.3–1.2)
Total Protein: 5.6 g/dL — ABNORMAL LOW (ref 6.5–8.1)

## 2021-03-25 LAB — CBC
HCT: 21 % — ABNORMAL LOW (ref 36.0–46.0)
Hemoglobin: 6.6 g/dL — CL (ref 12.0–15.0)
MCH: 30.4 pg (ref 26.0–34.0)
MCHC: 31.4 g/dL (ref 30.0–36.0)
MCV: 96.8 fL (ref 80.0–100.0)
Platelets: 319 10*3/uL (ref 150–400)
RBC: 2.17 MIL/uL — ABNORMAL LOW (ref 3.87–5.11)
RDW: 21.4 % — ABNORMAL HIGH (ref 11.5–15.5)
WBC: 16.9 10*3/uL — ABNORMAL HIGH (ref 4.0–10.5)
nRBC: 0.3 % — ABNORMAL HIGH (ref 0.0–0.2)

## 2021-03-25 LAB — GLUCOSE, CAPILLARY
Glucose-Capillary: 133 mg/dL — ABNORMAL HIGH (ref 70–99)
Glucose-Capillary: 147 mg/dL — ABNORMAL HIGH (ref 70–99)
Glucose-Capillary: 132 mg/dL — ABNORMAL HIGH (ref 70–99)
Glucose-Capillary: 111 mg/dL — ABNORMAL HIGH (ref 70–99)

## 2021-03-25 MED ORDER — TAMSULOSIN HCL 0.4 MG PO CAPS
0.4000 mg | ORAL_CAPSULE | Freq: Every day | ORAL | Status: DC
  Administered 2021-03-25 – 2021-03-28 (×4): 0.4 mg via ORAL
  Filled 2021-03-25 (×4): qty 1

## 2021-03-25 MED ORDER — FUROSEMIDE 10 MG/ML IJ SOLN
60.0000 mg | Freq: Two times a day (BID) | INTRAMUSCULAR | Status: DC
  Administered 2021-03-25 (×2): 60 mg via INTRAVENOUS
  Filled 2021-03-25 (×2): qty 6

## 2021-03-25 NOTE — Progress Notes (Signed)
Inpatient Diabetes Program Recommendations  AACE/ADA: New Consensus Statement on Inpatient Glycemic Control (2015)  Target Ranges:  Prepandial:   less than 140 mg/dL      Peak postprandial:   less than 180 mg/dL (1-2 hours)      Critically ill patients:  140 - 180 mg/dL   Lab Results  Component Value Date   GLUCAP 133 (H) 03/25/2021   HGBA1C 9.1 (H) 03/16/2021    Review of Glycemic Control Results for PENELOPE, FITTRO (MRN [id number]) as of 03/25/2021 09:57  Ref. Range 03/24/2021 07:50 03/24/2021 12:04 03/24/2021 17:18 03/24/2021 21:07 03/25/2021 08:18  Glucose-Capillary Latest Ref Range: 70 - 99 mg/dL 102 (H) 212 (H) 225 (H) 263 (H) 133 (H)   Diabetes history: DM2 Outpatient Diabetes medications:  Lantus 50 units q HS, Novolin 70/30 10 units tid with meals Current orders for Inpatient glycemic control:  Novolog moderate tid with meals and HS Lantus 20 units daily Inpatient Diabetes Program Recommendations:   Consider adding Novolog meal coverage 3 units tid with meals (hold if patient eats less than 50% or NPO).   Thanks  Adah Perl, RN, BC-ADM Inpatient Diabetes Coordinator Pager [phone] (8a-5p)

## 2021-03-25 NOTE — Progress Notes (Signed)
PROGRESS NOTE        PATIENT DETAILS Name: Tracey Bryant Age: 70 y.o. Sex: female Date of Birth: [birth year] Admit Date: 03/16/2021 Admitting Physician Deatra James, MD CMK:[id number], Arlyn Leak, FNP  Brief Narrative: Patient is a 70 y.o. female with past medical history of DM-2, HTN, A. fib, chronic systolic heart failure, s/p permanent pacemaker placement-presented to the ED on 3/21  with nausea/vomiting/abdominal pain-further evaluation revealed acute calculus cholecystitis.  Hospital course complicated by AKI, multifactorial anemia, and volume overload.  See below for further details  Significant events: 3/20>> ED visit for epistaxis 3/21>> admit to [facility] for acute calculus cholecystitis 3/24>> transfer to [facility] for percutaneous gallbladder drain placement  Significant studies: 3/22 >> CT abdomen/pelvis: Cholelithiasis, bilateral small pleural effusions, ascites with anasarca 3/23>> RUQ ultrasound: Acute calculus cholecystitis 3/23>> Echo: EF 25-30%, decreased RV systolic function 5/05>> mild central pulmonary vascular congestion.  Antimicrobial therapy: Zosyn: 3/22>>  Microbiology data: 3/22>> urine culture: E. Coli 3/22>> blood culture: No growth 3/24>> gallbladder/bile culture: No growth  Procedures : 3/24>> CT-guided cholecystostomy tube placement  Consults: IR, general surgery, nephrology, gastroenterology  DVT Prophylaxis : Place and maintain sequential compression device Start: 03/21/21 1323 Place and maintain sequential compression device Start: 03/20/21 1411 TED hose Start: 03/16/21 1441 SCDs Start: 03/16/21 1441    Subjective: Claims she is short of breath when lying flat, and gets short of breath with minimal activity.  Acknowledges significant weight gain and significant lower extremity edema over the past several days.  No abdominal pain.   Assessment/Plan: Severe sepsis due to acute calculus cholecystitis and UTI: Sepsis  physiology has resolved-culture data as above-we will plan on at least 10 days of empiric antibiotics.  Acute calculus cholecystitis: On IV Zosyn-evaluated by general surgery at [facility]-not a surgical candidate-hence percutaneous cholecystostomy tube placed.  IR following.  Transaminitis: Multifactorial-probably due to sepsis/acute cholecystitis-downtrending-continue to follow periodically.  AKI on CKD stage IIIa: AKI likely hemodynamically mediated-pain-significantly volume overloaded-starting IV Lasix.  Acute urinary retention: Foley catheter placed on 3/26-we will attempt a voiding trial in the next few days.  Hyponatremia: Due to hypervolemia-starting IV Lasix-follow.  Anemia: Multifactorial-some component of acute blood loss due to epistaxis that occurred 1 day prior to this hospitalization-on top of anemia due to chronic disease/critical illness.  Also FOBT positive-no overt GI blood loss.  Is a [name] will refuse blood transfusion-even in the face of life-threatening situation (this MD reconfirmed with patient on 3/30).  Acute on chronic systolic/diastolic heart failure: Massively volume overloaded-significant lower extremity edema-significant weight gain-start IV Lasix-follow weights/intake/output/electrolytes.  PAF: Appears to be in sinus rhythm-Eliquis on hold due to severity of anemia and recent epistaxis.  History of CVA: Nonfocal exam-difficult situation-Eliquis on hold  HLD: On Zetia  DM-2: CBG stable-continue Lantus 20 units daily and SSI.  Recent Labs    03/24/21 2107 03/25/21 0818 03/25/21 1206  GLUCAP 263* 133* 147*    Obesity: Estimated body mass index is 34.76 kg/m as calculated from the following:   Height as of this encounter: 5\' 6"  (1.676 m).   Weight as of this encounter: 97.7 kg.     Diet: Diet Order            Diet heart healthy/carb modified Room service appropriate? Yes; Fluid consistency: Thin; Fluid restriction: 1500 mL Fluid  Diet  effective now  Code Status: Full code  Family Communication: Son-[phone]-updated on the phone on 3/30  Disposition Plan: Status is: Inpatient  Remains inpatient appropriate because:Inpatient level of care appropriate due to severity of illness   Dispo: The patient is from: Home              Anticipated d/c is to: SNF              Patient currently is not medically stable to d/c.   Difficult to place patient No    Barriers to Discharge: Sepsis due to acute calculus cholecystitis-on IV Zosyn-now with significant volume overload-starting IV diuretics  Antimicrobial agents: Anti-infectives (From admission, onward)   Start     Dose/Rate Route Frequency Ordered Stop   03/18/21 2200  piperacillin-tazobactam (ZOSYN) IVPB 3.375 g        3.375 g 12.5 mL/hr over 240 Minutes Intravenous Every 12 hours 03/18/21 1201     03/17/21 1400  piperacillin-tazobactam (ZOSYN) IVPB 3.375 g  Status:  Discontinued        3.375 g 12.5 mL/hr over 240 Minutes Intravenous Every 8 hours 03/17/21 1246 03/18/21 1201       Time spent: 35- minutes-Greater than 50% of this time was spent in counseling, explanation of diagnosis, planning of further management, and coordination of care.  MEDICATIONS: Scheduled Meds: . Chlorhexidine Gluconate Cloth  6 each Topical Daily  . ezetimibe  10 mg Oral Daily  . feeding supplement (GLUCERNA SHAKE)  237 mL Oral TID BM  . feeding supplement (NEPRO CARB STEADY)  237 mL Oral TID BM  . furosemide  60 mg Intravenous Q12H  . insulin aspart  0-15 Units Subcutaneous TID WC  . insulin aspart  0-5 Units Subcutaneous QHS  . insulin glargine  20 Units Subcutaneous Daily  . multivitamin with minerals  1 tablet Oral Daily  . sodium bicarbonate  1,300 mg Oral TID  . sodium chloride flush  3 mL Intravenous Q12H  . sodium chloride flush  5 mL Intracatheter Q8H   Continuous Infusions: . ferumoxytol Stopped (03/20/21 2015)  .  piperacillin-tazobactam (ZOSYN)  IV 3.375 g (03/25/21 1037)   PRN Meds:.bisacodyl, dextrose, HYDROmorphone (DILAUDID) injection, ipratropium, levalbuterol, ondansetron **OR** ondansetron (ZOFRAN) IV, oxyCODONE, senna-docusate, traZODone   PHYSICAL EXAM: Vital signs: Vitals:   03/25/21 0349 03/25/21 0500 03/25/21 0800 03/25/21 1202  BP: (!) 141/64  120/64 128/71  Pulse: 72  69 69  Resp: 18  18 16   Temp: 97.8 F (36.6 C)  97.7 F (36.5 C) 98.2 F (36.8 C)  TempSrc: Axillary  Axillary Oral  SpO2: 93%  94% 96%  Weight:  97.7 kg    Height:       Filed Weights   03/22/21 0600 03/24/21 0404 03/25/21 0500  Weight: 91.9 kg 100 kg 97.7 kg   Body mass index is 34.76 kg/m.   Gen Exam:Alert awake-not in any distress HEENT:atraumatic, normocephalic Chest: B/L clear to auscultation anteriorly CVS:S1S2 regular Abdomen:soft non tender, non distended Extremities:++ edema Neurology: Non focal Skin: no rash  I have personally reviewed following labs and imaging studies  LABORATORY DATA: CBC: Recent Labs  Lab 03/21/21 0115 03/22/21 0219 03/23/21 0231 03/24/21 0113 03/25/21 0217  WBC 32.3* 22.6* 23.5* 19.3* 16.9*  HGB 6.6* 6.2* 6.8* 6.4* 6.6*  HCT 20.6* 20.0* 22.0* 21.1* 21.0*  MCV 94.1 93.0 95.7 97.2 96.8  PLT 129* 266 270 298 938    Basic Metabolic Panel: Recent Labs  Lab 03/21/21 0115 03/22/21 0219 03/23/21 0231 03/24/21 0113 03/25/21  0217  NA 131* 131* 135 132* 129*  K 4.3 3.4* 3.4* 3.7 3.8  CL 105 103 107 104 101  CO2 12* 15* 17* 17* 17*  GLUCOSE 169* 256* 159* 143* 190*  BUN 92* 88* 79* 73* 70*  CREATININE 3.99* 3.83* 3.25* 2.88* 3.23*  CALCIUM 6.7* 6.6* 6.7* 6.9* 7.1*  MG  --   --  2.4  --   --     GFR: Estimated Creatinine Clearance: 19.4 mL/min (A) (by C-G formula based on SCr of 3.23 mg/dL (H)).  Liver Function Tests: Recent Labs  Lab 03/21/21 0115 03/22/21 0219 03/23/21 0231 03/24/21 0113 03/25/21 0217  AST 338* 193* 106* 62* 34  ALT 847*  652* 537* 370* 272*  ALKPHOS 76 59 53 47 45  BILITOT 1.2 0.7 0.9 1.0 1.1  PROT 5.7* 5.4* 5.6* 5.5* 5.6*  ALBUMIN 2.3* 2.0* 2.0* 1.9* 1.9*   No results for input(s): LIPASE, AMYLASE in the last 168 hours. No results for input(s): AMMONIA in the last 168 hours.  Coagulation Profile: Recent Labs  Lab 03/19/21 0504 03/20/21 0147 03/21/21 0115  INR 1.4* 1.6* 1.5*    Cardiac Enzymes: No results for input(s): CKTOTAL, CKMB, CKMBINDEX, TROPONINI in the last 168 hours.  BNP (last 3 results) No results for input(s): PROBNP in the last 8760 hours.  Lipid Profile: No results for input(s): CHOL, HDL, LDLCALC, TRIG, CHOLHDL, LDLDIRECT in the last 72 hours.  Thyroid Function Tests: No results for input(s): TSH, T4TOTAL, FREET4, T3FREE, THYROIDAB in the last 72 hours.  Anemia Panel: No results for input(s): VITAMINB12, FOLATE, FERRITIN, TIBC, IRON, RETICCTPCT in the last 72 hours.  Urine analysis:    Component Value Date/Time   COLORURINE AMBER (A) 03/17/2021 1208   APPEARANCEUR CLOUDY (A) 03/17/2021 1208   LABSPEC 1.015 03/17/2021 1208   PHURINE 5.0 03/17/2021 1208   GLUCOSEU 50 (A) 03/17/2021 1208   HGBUR SMALL (A) 03/17/2021 1208   BILIRUBINUR NEGATIVE 03/17/2021 1208   KETONESUR NEGATIVE 03/17/2021 1208   PROTEINUR >=300 (A) 03/17/2021 1208   UROBILINOGEN 0.2 01/17/2013 1910   NITRITE NEGATIVE 03/17/2021 1208   LEUKOCYTESUR LARGE (A) 03/17/2021 1208    Sepsis Labs: Lactic Acid, Venous    Component Value Date/Time   LATICACIDVEN 1.4 03/21/2021 0805    MICROBIOLOGY: Recent Results (from the past 240 hour(s))  SARS CORONAVIRUS 2 (TAT 6-24 HRS) Nasopharyngeal Nasopharyngeal Swab     Status: None   Collection Time: 03/16/21  1:32 PM   Specimen: Nasopharyngeal Swab  Result Value Ref Range Status   SARS Coronavirus 2 NEGATIVE NEGATIVE Final    Comment: (NOTE) SARS-CoV-2 target nucleic acids are NOT DETECTED.  The SARS-CoV-2 RNA is generally detectable in upper and  lower respiratory specimens during the acute phase of infection. Negative results do not preclude SARS-CoV-2 infection, do not rule out co-infections with other pathogens, and should not be used as the sole basis for treatment or other patient management decisions. Negative results must be combined with clinical observations, patient history, and epidemiological information. The expected result is Negative.  Fact Sheet for Patients: SugarRoll.be  Fact Sheet for Healthcare Providers: https://www.woods-mathews.com/  This test is not yet approved or cleared by the [place] FDA and  has been authorized for detection and/or diagnosis of SARS-CoV-2 by FDA under an Emergency Use Authorization (EUA). This EUA will remain  in effect (meaning this test can be used) for the duration of the COVID-19 declaration under Se ction 564(b)(1) of the Act, 21 U.S.C. section 360bbb-3(b)(1),  unless the authorization is terminated or revoked sooner.  Performed at [facility], [place] [address], [place], [place] [address]   MRSA PCR Screening     Status: None   Collection Time: 03/16/21  5:06 PM   Specimen: Nasal Mucosa; Nasopharyngeal  Result Value Ref Range Status   MRSA by PCR NEGATIVE NEGATIVE Final    Comment:        The GeneXpert MRSA Assay (FDA approved for NASAL specimens only), is one component of a comprehensive MRSA colonization surveillance program. It is not intended to diagnose MRSA infection nor to guide or monitor treatment for MRSA infections. Performed at [facility], [address] [place], [place] [address]   Culture, Urine     Status: Abnormal   Collection Time: 03/17/21 12:08 PM   Specimen: Urine, Clean Catch  Result Value Ref Range Status   Specimen Description   Final    URINE, CLEAN CATCH Performed at [facility], [address] [place], [place] [address]    Special Requests   Final    NONE Performed at [facility], [address], [place], [place] [address]    Culture >=100,000 COLONIES/mL ESCHERICHIA COLI (A)  Final   Report Status 03/19/2021 FINAL  Final   Organism ID, Bacteria ESCHERICHIA COLI (A)  Final      Susceptibility   Escherichia coli - MIC*    AMPICILLIN 4 SENSITIVE Sensitive     CEFAZOLIN <=4 SENSITIVE Sensitive     CEFEPIME <=0.12 SENSITIVE Sensitive     CEFTRIAXONE <=0.25 SENSITIVE Sensitive     CIPROFLOXACIN <=0.25 SENSITIVE Sensitive     GENTAMICIN <=1 SENSITIVE Sensitive     IMIPENEM <=0.25 SENSITIVE Sensitive     NITROFURANTOIN <=16 SENSITIVE Sensitive     TRIMETH/SULFA <=20 SENSITIVE Sensitive     AMPICILLIN/SULBACTAM <=2 SENSITIVE Sensitive     PIP/TAZO <=4 SENSITIVE Sensitive     * >=100,000 COLONIES/mL ESCHERICHIA COLI  Culture, blood (routine x 2)     Status: None   Collection Time: 03/17/21  1:18 PM   Specimen: BLOOD  Result Value Ref Range Status   Specimen Description BLOOD LEFT ANTECUBITAL  Final   Special Requests   Final    Blood Culture adequate volume BOTTLES DRAWN AEROBIC AND ANAEROBIC   Culture   Final    NO GROWTH 5 DAYS Performed at [facility], [address], [place], [place] [address]    Report Status 03/22/2021 FINAL  Final  Culture, blood (routine x 2)     Status: None   Collection Time: 03/17/21  1:19 PM   Specimen: BLOOD LEFT HAND  Result Value Ref Range Status   Specimen Description BLOOD LEFT HAND  Final   Special Requests   Final    Blood Culture results may not be optimal due to an inadequate volume of blood received in culture bottles BOTTLES DRAWN AEROBIC ONLY   Culture   Final    NO GROWTH 5 DAYS Performed at [facility], [address] [place], [place] [address]    Report Status 03/22/2021 FINAL  Final  Aerobic/Anaerobic Culture (surgical/deep wound)     Status: None   Collection Time: 03/19/21  2:51 PM   Specimen: Abdomen; Abdominal Fluid  Result Value Ref Range Status   Specimen Description ABDOMEN  Final   Special Requests  GALLBLADDER  Final   Gram Stain NO WBC SEEN NO ORGANISMS SEEN   Final   Culture   Final    No  growth aerobically or anaerobically. Performed at [facility], [place] [address] [place], [place] [address]    Report Status 03/24/2021 FINAL  Final    RADIOLOGY STUDIES/RESULTS: No results found.   LOS: 9 days   Oren Binet, MD  [facility]    To contact the attending provider between 7A-7P or the covering provider during after hours 7P-7A, please log into the web site [email or web address] and access using universal [place] password for that web site. If you do not have the password, please call the hospital operator.  03/25/2021, 2:01 PM

## 2021-03-25 NOTE — Progress Notes (Signed)
    Referring Physician(s): Dr Urban Gibson  Supervising Physician: Ruthann Cancer  Patient Status:  [facility] - In-pt  Chief Complaint:  Perc chole drain placed in IR 03/19/21  Subjective:  Pt feeling better daily "But so weak" OP of drain is good: bile color 220 cc yesterday  Allergies: Patient has no known allergies.  Medications: Prior to Admission medications   Medication Sig Start Date End Date Taking? Authorizing Provider  apixaban (ELIQUIS) 5 MG TABS tablet Take 5 mg by mouth 2 (two) times daily.   Yes [provider]  ezetimibe (ZETIA) 10 MG tablet Take 10 mg by mouth daily.   Yes [provider]  furosemide (LASIX) 20 MG tablet Take 20 mg by mouth.   Yes [provider]  glipiZIDE (GLUCOTROL XL) 5 MG 24 hr tablet Take 5 mg by mouth daily with breakfast.   Yes [provider]  insulin glargine (LANTUS) 100 UNIT/ML injection Inject 50 Units into the skin at bedtime.   Yes [provider]  insulin NPH-regular (NOVOLIN 70/30) (70-30) 100 UNIT/ML injection Inject 10 Units into the skin 3 (three) times daily after meals.   Yes [provider]     Vital Signs: BP 128/71 (BP Location: Right Wrist)   Pulse 69   Temp 98.2 F (36.8 C) (Oral)   Resp 16   Ht 5\' 6"  (1.676 m)   Wt 215 lb 6.2 oz (97.7 kg)   SpO2 96%   BMI 34.76 kg/m   Physical Exam Skin:    General: Skin is warm.     Comments: Skin is clean and dry NT no bleeding No sign of infection 220 cc bile OP yesterday 30 cc in JP No orgs      Imaging: No results found.  Labs:  CBC: Recent Labs    03/22/21 0219 03/23/21 0231 03/24/21 0113 03/25/21 0217  WBC 22.6* 23.5* 19.3* 16.9*  HGB 6.2* 6.8* 6.4* 6.6*  HCT 20.0* 22.0* 21.1* 21.0*  PLT 266 270 298 319    COAGS: Recent Labs    03/18/21 0630 03/19/21 0504 03/20/21 0147 03/21/21 0115  INR 1.6* 1.4* 1.6* 1.5*    BMP: Recent Labs    03/22/21 0219 03/23/21 0231 03/24/21 0113  03/25/21 0217  NA 131* 135 132* 129*  K 3.4* 3.4* 3.7 3.8  CL 103 107 104 101  CO2 15* 17* 17* 17*  GLUCOSE 256* 159* 143* 190*  BUN 88* 79* 73* 70*  CALCIUM 6.6* 6.7* 6.9* 7.1*  CREATININE 3.83* 3.25* 2.88* 3.23*  GFRNONAA 12* 15* 17* 15*    LIVER FUNCTION TESTS: Recent Labs    03/22/21 0219 03/23/21 0231 03/24/21 0113 03/25/21 0217  BILITOT 0.7 0.9 1.0 1.1  AST 193* 106* 62* 34  ALT 652* 537* 370* 272*  ALKPHOS 59 53 47 45  PROT 5.4* 5.6* 5.5* 5.6*  ALBUMIN 2.0* 2.0* 1.9* 1.9*    Assessment and Plan:  Perc chole placed in IR 03/19/21 Doing well Will need to remain 6-8 weeks Will need flushed daily as OP- please send home with Rx for flushes (5 cc daily) She will hear from IR scheduler for follow up time and date  Electronically Signed: Lavonia Drafts, PA-C 03/25/2021, 1:27 PM   I spent a total of 15 Minutes at the the patient's bedside AND on the patient's hospital floor or unit, greater than 50% of which was counseling/coordinating care for perc chole drain

## 2021-03-26 DIAGNOSIS — K810 Acute cholecystitis: Secondary | ICD-10-CM | POA: Diagnosis not present

## 2021-03-26 DIAGNOSIS — E0810 Diabetes mellitus due to underlying condition with ketoacidosis without coma: Secondary | ICD-10-CM | POA: Diagnosis not present

## 2021-03-26 DIAGNOSIS — N390 Urinary tract infection, site not specified: Secondary | ICD-10-CM | POA: Diagnosis not present

## 2021-03-26 DIAGNOSIS — I5021 Acute systolic (congestive) heart failure: Secondary | ICD-10-CM | POA: Diagnosis not present

## 2021-03-26 LAB — GLUCOSE, CAPILLARY
Glucose-Capillary: 75 mg/dL (ref 70–99)
Glucose-Capillary: 153 mg/dL — ABNORMAL HIGH (ref 70–99)
Glucose-Capillary: 130 mg/dL — ABNORMAL HIGH (ref 70–99)
Glucose-Capillary: 120 mg/dL — ABNORMAL HIGH (ref 70–99)

## 2021-03-26 LAB — COMPREHENSIVE METABOLIC PANEL
ALT: 200 U/L — ABNORMAL HIGH (ref 0–44)
AST: 27 U/L (ref 15–41)
Albumin: 1.8 g/dL — ABNORMAL LOW (ref 3.5–5.0)
Alkaline Phosphatase: 39 U/L (ref 38–126)
Anion gap: 9 (ref 5–15)
BUN: 63 mg/dL — ABNORMAL HIGH (ref 8–23)
CO2: 21 mmol/L — ABNORMAL LOW (ref 22–32)
Calcium: 7.3 mg/dL — ABNORMAL LOW (ref 8.9–10.3)
Chloride: 103 mmol/L (ref 98–111)
Creatinine, Ser: 2.94 mg/dL — ABNORMAL HIGH (ref 0.44–1.00)
GFR, Estimated: 17 mL/min — ABNORMAL LOW (ref >60)
Glucose, Bld: 102 mg/dL — ABNORMAL HIGH (ref 70–99)
Potassium: 3.6 mmol/L (ref 3.5–5.1)
Sodium: 133 mmol/L — ABNORMAL LOW (ref 135–145)
Total Bilirubin: 1 mg/dL (ref 0.3–1.2)
Total Protein: 5.7 g/dL — ABNORMAL LOW (ref 6.5–8.1)

## 2021-03-26 MED ORDER — PIPERACILLIN-TAZOBACTAM 3.375 G IVPB
3.3750 g | Freq: Two times a day (BID) | INTRAVENOUS | Status: AC
  Administered 2021-03-26: 3.375 g via INTRAVENOUS
  Filled 2021-03-26: qty 50

## 2021-03-26 MED ORDER — FUROSEMIDE 10 MG/ML IJ SOLN
80.0000 mg | Freq: Two times a day (BID) | INTRAMUSCULAR | Status: DC
  Administered 2021-03-26 – 2021-04-01 (×14): 80 mg via INTRAVENOUS
  Filled 2021-03-26 (×14): qty 8

## 2021-03-26 NOTE — Evaluation (Signed)
Occupational Therapy Re-Evaluation Patient Details Name: Tracey Bryant MRN: [id number] DOB: 02/10/1951 Today's Date: 03/26/2021    History of Present Illness 70 y.o. female with presented to [facility] ED on 3/21  with nausea/vomiting/abdominal pain-further evaluation revealed acute calculus cholecystitis. Transferred to [facility] 3/24 for percutaneous gallbladder drain placement  Hospital course complicated by AKI, multifactorial anemia, and volume overload PMH: past medical history of DM-2, HTN, A. fib, chronic systolic heart failure, s/p permanent pacemaker placement   Clinical Impression   PTA, pt lives with son and reports Modified Independence with ADLs and mobility using Rollator. Pt plans to discharge to her sister's home who is a caregiver and can assist as needed. Pt presents now with deficits in strength, endurance and balance though greatly limited by low hgb/dizziness. Pt Min A for bed mobility to get to EOB but began to lose consciousness before fully getting to EOB. OT assisted pt back to supine where symptoms improved and completed remainder of session at bed level. Pt requires overall Min A for UB ADLs and up to Max A for LB ADLs due to deficits. Pt very pleasant and motivated to return to independence. Once hemoglobin levels normalize, expect pt to progress well.    Follow Up Recommendations  CIR;Supervision/Assistance - 24 hour    Equipment Recommendations  None recommended by OT    Recommendations for Other Services Rehab consult     Precautions / Restrictions Precautions Precautions: Fall Precaution Comments: monitor BP, hemoglobin Restrictions Weight Bearing Restrictions: No      Mobility Bed Mobility Overal bed mobility: Needs Assistance Bed Mobility: Supine to Sit;Sit to Supine     Supine to sit: Min assist;HOB elevated Sit to supine: Mod assist   General bed mobility comments: Min A for attempts to sit EOB. However, before getting fully EOB, pt began to lose  consciousness requiring OT assist to return back to supine.    Transfers Overall transfer level: Needs assistance Equipment used: Rolling walker (2 wheeled);1 person hand held assist Transfers: Sit to/from [facility];Lateral/Scoot Transfers Sit to Stand: Mod assist;From elevated surface        Lateral/Scoot Transfers: Min assist General transfer comment: unable to attempt    Balance Overall balance assessment: Needs assistance Sitting-balance support: Feet supported;Bilateral upper extremity supported;No upper extremity supported;Single extremity supported Sitting balance-Leahy Scale: Fair     Standing balance support: Bilateral upper extremity supported;During functional activity Standing balance-Leahy Scale: Poor Standing balance comment: requires B UE support and assist to maintain standing                           ADL either performed or assessed with clinical judgement   ADL Overall ADL's : Needs assistance/impaired Eating/Feeding: Set up;Sitting Eating/Feeding Details (indicate cue type and reason): setup to drink from cup of water Grooming: Set up;Bed level   Upper Body Bathing: Minimal assistance;Bed level   Lower Body Bathing: Moderate assistance;Sitting/lateral leans   Upper Body Dressing : Minimal assistance;Bed level   Lower Body Dressing: Maximal assistance;Sitting/lateral leans;Bed level Lower Body Dressing Details (indicate cue type and reason): able to bring feet to self in bed with figure four position     Toileting- Clothing Manipulation and Hygiene: Maximal assistance;Sitting/lateral lean         General ADL Comments: Limited due to dizziness, unable to get fully EOB due to loss of consciousness and need to return back to bed     Vision Baseline Vision/History: Wears glasses  Wears Glasses: At all times Patient Visual Report: No change from baseline Vision Assessment?: Vision impaired- to be further tested in  functional context Additional Comments: unable to see well without glasses     Perception     Praxis      Pertinent Vitals/Pain Pain Assessment: Faces Pain Score: 6  Faces Pain Scale: Hurts a little bit Pain Location: generalized Pain Descriptors / Indicators: Grimacing Pain Intervention(s): Limited activity within patient's tolerance;Monitored during session     Hand Dominance Right   Extremity/Trunk Assessment Upper Extremity Assessment Upper Extremity Assessment: Generalized weakness   Lower Extremity Assessment Lower Extremity Assessment: Defer to PT evaluation   Cervical / Trunk Assessment Cervical / Trunk Assessment: Normal   Communication Communication Communication: No difficulties   Cognition Arousal/Alertness: Awake/alert Behavior During Therapy: Flat affect Overall Cognitive Status: Within Functional Limits for tasks assessed                                     General Comments  BP at rest 135/58, attempted to obtain BP sitting EOB but pt required assist to lay back before reading finished at 117/50. Pt began to lose consciousness before fully getting to EOB.Collaborated with RN prior to session. Pt is Jehovah's Witness and will not receive blood for low hgb levels.    Exercises     Shoulder Instructions      Home Living Family/patient expects to be discharged to:: Private residence Living Arrangements: Other relatives Available Help at Discharge: Family;Available 24 hours/day Type of Home: House Home Access: Stairs to enter [employer] of Steps: 3 Entrance Stairs-Rails: Right Home Layout: One level     Bathroom Shower/Tub: Occupational psychologist: Standard     Home Equipment: Environmental consultant - 4 wheels;Shower seat;Bedside commode          Prior Functioning/Environment Level of Independence: Independent with assistive device(s);Needs assistance  Gait / Transfers Assistance Needed: Pt reports using rollator walker  for home ambulation and limited community ambulation, using w/c to get into/out of doctor office ADL's / Homemaking Assistance Needed: Pt reports independent with bathing, dressing, light cooking; son performs cooking, cleaning, driving   Comments: denies falls        OT Problem List: Decreased strength;Decreased activity tolerance;Impaired balance (sitting and/or standing);Decreased safety awareness      OT Treatment/Interventions: Self-care/ADL training;Therapeutic exercise;Therapeutic activities;Patient/family education;Balance training;DME and/or AE instruction    OT Goals(Current goals can be found in the care plan section) Acute Rehab OT Goals Patient Stated Goal: to get to her sisters house OT Goal Formulation: With patient Time For Goal Achievement: 04/09/21 Potential to Achieve Goals: Good ADL Goals Pt Will Perform Grooming: with set-up;standing Pt Will Perform Upper Body Dressing: with set-up;sitting Pt Will Perform Lower Body Dressing: with min guard assist;sitting/lateral leans;sit to/from stand;with adaptive equipment  OT Frequency: Min 2X/week   Barriers to D/C:            Co-evaluation              AM-PAC OT "6 Clicks" Daily Activity     Outcome Measure Help from another person eating meals?: A Little Help from another person taking care of personal grooming?: A Little Help from another person toileting, which includes using toliet, bedpan, or urinal?: A Lot Help from another person bathing (including washing, rinsing, drying)?: A Lot Help from another person to put on and taking off regular upper  body clothing?: A Little Help from another person to put on and taking off regular lower body clothing?: A Lot 6 Click Score: 15   End of Session Nurse Communication: Other (comment);Mobility status (BP)  Activity Tolerance: Treatment limited secondary to medical complications (Comment) Patient left: in bed;with call bell/phone within reach;with bed alarm  set  OT Visit Diagnosis: Unsteadiness on feet (R26.81);Muscle weakness (generalized) (M62.81);Dizziness and giddiness (R42)                Time: 1287-8676 OT Time Calculation (min): 24 min Charges:  OT General Charges $OT Visit: 1 Visit OT Evaluation $OT Re-eval: 1 Re-eval OT Treatments $Therapeutic Activity: 8-22 mins  Malachy Chamber, OTR/L Acute Rehab Services Office: [phone]  [name] 03/26/2021, 12:31 PM

## 2021-03-26 NOTE — Progress Notes (Signed)
PROGRESS NOTE        PATIENT DETAILS Name: Tracey Bryant Age: 70 y.o. Sex: female Date of Birth: 1951/07/13 Admit Date: 03/16/2021 Admitting Physician Deatra James, MD MMN:[id number], Arlyn Leak, FNP  Brief Narrative: Patient is a 70 y.o. female with past medical history of DM-2, HTN, A. fib, chronic systolic heart failure, s/p permanent pacemaker placement-presented to the ED on 3/21  with nausea/vomiting/abdominal pain-further evaluation revealed acute calculus cholecystitis.  Hospital course complicated by AKI, multifactorial anemia, and volume overload.  See below for further details  Significant events: 3/20>> ED visit for epistaxis 3/21>> admit to [facility] for acute calculus cholecystitis 3/24>> transfer to [facility] for percutaneous gallbladder drain placement  Significant studies: 3/22 >> CT abdomen/pelvis: Cholelithiasis, bilateral small pleural effusions, ascites with anasarca 3/23>> RUQ ultrasound: Acute calculus cholecystitis 3/23>> Echo: EF 25-30%, decreased RV systolic function 5/79>> mild central pulmonary vascular congestion.  Antimicrobial therapy: Zosyn: 3/22>>3/31  Microbiology data: 3/22>> urine culture: E. Coli 3/22>> blood culture: No growth 3/24>> gallbladder/bile culture: No growth  Procedures : 3/24>> CT-guided cholecystostomy tube placement  Consults: IR, general surgery, nephrology, gastroenterology  DVT Prophylaxis : Place and maintain sequential compression device Start: 03/21/21 1323 Place and maintain sequential compression device Start: 03/20/21 1411 TED hose Start: 03/16/21 1441 SCDs Start: 03/16/21 1441    Subjective: Acknowledges ongoing fatigue and tiredness.  Claims that she is less short of breath when lying down compared to yesterday.  Assessment/Plan: Severe sepsis due to acute calculus cholecystitis and UTI: Sepsis physiology has resolved-culture data as above-we will complete 10 days of Zosyn on 3/31.   Acute  calculus cholecystitis: On IV Zosyn-evaluated by general surgery at [facility]-not a surgical candidate-hence percutaneous cholecystostomy tube placed.  IR following.  Transaminitis: Multifactorial-probably due to sepsis/acute cholecystitis-downtrending-continue to follow periodically.  AKI on CKD stage IIIa: AKI likely hemodynamically mediated-remains volume overloaded-continue IV Lasix-follow electrolytes/awake/intake/output.  Acute urinary retention: Foley catheter placed on 3/26-started Flomax on 3/30-we will attempt voiding trial over the next few days.    Hyponatremia: Due to hypervolemia-being with diuretics.  Anemia: Multifactorial-some component of acute blood loss due to epistaxis that occurred 1 day prior to this hospitalization-on top of anemia due to chronic disease/critical illness.  Also FOBT positive-no overt GI blood loss.  Is a [name] will refuse blood transfusion-even in the face of life-threatening situation (this MD reconfirmed with patient on 3/30).  Acute on chronic systolic/diastolic heart failure: Continues to have significant volume overload-remains on IV Lasix-continue to follow weights/electrolytes/intake/output  PAF: Appears to be in sinus rhythm-Eliquis on hold due to severity of anemia and recent epistaxis.  History of CVA: Nonfocal exam-difficult situation-Eliquis on hold  HLD: On Zetia  DM-2: CBG stable-continue Lantus 20 units daily and SSI.  Recent Labs    03/25/21 1654 03/25/21 1956 03/26/21 0807  GLUCAP 132* 111* 75    Obesity: Estimated body mass index is 34.76 kg/m as calculated from the following:   Height as of this encounter: 5\' 6"  (1.676 m).   Weight as of this encounter: 97.7 kg.     Diet: Diet Order            Diet heart healthy/carb modified Room service appropriate? Yes; Fluid consistency: Thin; Fluid restriction: 1500 mL Fluid  Diet effective now  Code Status: Full code  Family  Communication: Son-[phone]-updated on the phone on 3/31  Disposition Plan: Status is: Inpatient  Remains inpatient appropriate because:Inpatient level of care appropriate due to severity of illness   Dispo: The patient is from: Home              Anticipated d/c is to: SNF              Patient currently is not medically stable to d/c.   Difficult to place patient No    Barriers to Discharge: Sepsis due to acute calculus cholecystitis-on IV Zosyn-now with significant volume overload-starting IV diuretics  Antimicrobial agents: Anti-infectives (From admission, onward)   Start     Dose/Rate Route Frequency Ordered Stop   03/18/21 2200  piperacillin-tazobactam (ZOSYN) IVPB 3.375 g        3.375 g 12.5 mL/hr over 240 Minutes Intravenous Every 12 hours 03/18/21 1201     03/17/21 1400  piperacillin-tazobactam (ZOSYN) IVPB 3.375 g  Status:  Discontinued        3.375 g 12.5 mL/hr over 240 Minutes Intravenous Every 8 hours 03/17/21 1246 03/18/21 1201       Time spent: 35- minutes-Greater than 50% of this time was spent in counseling, explanation of diagnosis, planning of further management, and coordination of care.  MEDICATIONS: Scheduled Meds: . Chlorhexidine Gluconate Cloth  6 each Topical Daily  . ezetimibe  10 mg Oral Daily  . furosemide  80 mg Intravenous Q12H  . insulin aspart  0-15 Units Subcutaneous TID WC  . insulin aspart  0-5 Units Subcutaneous QHS  . insulin glargine  20 Units Subcutaneous Daily  . multivitamin with minerals  1 tablet Oral Daily  . sodium bicarbonate  1,300 mg Oral TID  . sodium chloride flush  3 mL Intravenous Q12H  . sodium chloride flush  5 mL Intracatheter Q8H  . tamsulosin  0.4 mg Oral Daily   Continuous Infusions: . ferumoxytol Stopped (03/20/21 2015)  . piperacillin-tazobactam (ZOSYN)  IV 3.375 g (03/26/21 0836)   PRN Meds:.bisacodyl, dextrose, HYDROmorphone (DILAUDID) injection, ipratropium, levalbuterol, ondansetron **OR**  ondansetron (ZOFRAN) IV, oxyCODONE, senna-docusate, traZODone   PHYSICAL EXAM: Vital signs: Vitals:   03/25/21 1953 03/25/21 2331 03/26/21 0439 03/26/21 0804  BP: (!) 111/58 (!) 126/59 106/61 (!) 111/58  Pulse: 72 73 72 74  Resp: 15 16 16 16   Temp: 98.9 F (37.2 C) 98.7 F (37.1 C) 98.6 F (37 C) 97.9 F (36.6 C)  TempSrc: Axillary Axillary Axillary Oral  SpO2: 95% 91% 92% 95%  Weight:      Height:       Filed Weights   03/22/21 0600 03/24/21 0404 03/25/21 0500  Weight: 91.9 kg 100 kg 97.7 kg   Body mass index is 34.76 kg/m.   Gen Exam:Alert awake-not in any distress HEENT:atraumatic, normocephalic Chest: B/L clear to auscultation anteriorly CVS:S1S2 regular Abdomen:soft non tender, non distended Extremities:++ edema Neurology: Non focal Skin: no rash  I have personally reviewed following labs and imaging studies  LABORATORY DATA: CBC: Recent Labs  Lab 03/21/21 0115 03/22/21 0219 03/23/21 0231 03/24/21 0113 03/25/21 0217  WBC 32.3* 22.6* 23.5* 19.3* 16.9*  HGB 6.6* 6.2* 6.8* 6.4* 6.6*  HCT 20.6* 20.0* 22.0* 21.1* 21.0*  MCV 94.1 93.0 95.7 97.2 96.8  PLT 129* 266 270 298 638    Basic Metabolic Panel: Recent Labs  Lab 03/22/21 0219 03/23/21 0231 03/24/21 0113 03/25/21 0217 03/26/21 0055  NA 131* 135 132* 129* 133*  K 3.4* 3.4*  3.7 3.8 3.6  CL 103 107 104 101 103  CO2 15* 17* 17* 17* 21*  GLUCOSE 256* 159* 143* 190* 102*  BUN 88* 79* 73* 70* 63*  CREATININE 3.83* 3.25* 2.88* 3.23* 2.94*  CALCIUM 6.6* 6.7* 6.9* 7.1* 7.3*  MG  --  2.4  --   --   --     GFR: Estimated Creatinine Clearance: 21.3 mL/min (A) (by C-G formula based on SCr of 2.94 mg/dL (H)).  Liver Function Tests: Recent Labs  Lab 03/22/21 0219 03/23/21 0231 03/24/21 0113 03/25/21 0217 03/26/21 0055  AST 193* 106* 62* 34 27  ALT 652* 537* 370* 272* 200*  ALKPHOS 59 53 47 45 39  BILITOT 0.7 0.9 1.0 1.1 1.0  PROT 5.4* 5.6* 5.5* 5.6* 5.7*  ALBUMIN 2.0* 2.0* 1.9* 1.9* 1.8*    No results for input(s): LIPASE, AMYLASE in the last 168 hours. No results for input(s): AMMONIA in the last 168 hours.  Coagulation Profile: Recent Labs  Lab 03/20/21 0147 03/21/21 0115  INR 1.6* 1.5*    Cardiac Enzymes: No results for input(s): CKTOTAL, CKMB, CKMBINDEX, TROPONINI in the last 168 hours.  BNP (last 3 results) No results for input(s): PROBNP in the last 8760 hours.  Lipid Profile: No results for input(s): CHOL, HDL, LDLCALC, TRIG, CHOLHDL, LDLDIRECT in the last 72 hours.  Thyroid Function Tests: No results for input(s): TSH, T4TOTAL, FREET4, T3FREE, THYROIDAB in the last 72 hours.  Anemia Panel: No results for input(s): VITAMINB12, FOLATE, FERRITIN, TIBC, IRON, RETICCTPCT in the last 72 hours.  Urine analysis:    Component Value Date/Time   COLORURINE AMBER (A) 03/17/2021 1208   APPEARANCEUR CLOUDY (A) 03/17/2021 1208   LABSPEC 1.015 03/17/2021 1208   PHURINE 5.0 03/17/2021 1208   GLUCOSEU 50 (A) 03/17/2021 1208   HGBUR SMALL (A) 03/17/2021 1208   BILIRUBINUR NEGATIVE 03/17/2021 1208   KETONESUR NEGATIVE 03/17/2021 1208   PROTEINUR >=300 (A) 03/17/2021 1208   UROBILINOGEN 0.2 01/17/2013 1910   NITRITE NEGATIVE 03/17/2021 1208   LEUKOCYTESUR LARGE (A) 03/17/2021 1208    Sepsis Labs: Lactic Acid, Venous    Component Value Date/Time   LATICACIDVEN 1.4 03/21/2021 0805    MICROBIOLOGY: Recent Results (from the past 240 hour(s))  SARS CORONAVIRUS 2 (TAT 6-24 HRS) Nasopharyngeal Nasopharyngeal Swab     Status: None   Collection Time: 03/16/21  1:32 PM   Specimen: Nasopharyngeal Swab  Result Value Ref Range Status   SARS Coronavirus 2 NEGATIVE NEGATIVE Final    Comment: (NOTE) SARS-CoV-2 target nucleic acids are NOT DETECTED.  The SARS-CoV-2 RNA is generally detectable in upper and lower respiratory specimens during the acute phase of infection. Negative results do not preclude SARS-CoV-2 infection, do not rule out co-infections with other  pathogens, and should not be used as the sole basis for treatment or other patient management decisions. Negative results must be combined with clinical observations, patient history, and epidemiological information. The expected result is Negative.  Fact Sheet for Patients: SugarRoll.be  Fact Sheet for Healthcare Providers: https://www.woods-mathews.com/  This test is not yet approved or cleared by the [place] FDA and  has been authorized for detection and/or diagnosis of SARS-CoV-2 by FDA under an Emergency Use Authorization (EUA). This EUA will remain  in effect (meaning this test can be used) for the duration of the COVID-19 declaration under Se ction 564(b)(1) of the Act, 21 U.S.C. section 360bbb-3(b)(1), unless the authorization is terminated or revoked sooner.  Performed at [facility],  [address] [place], [place] [address]   MRSA PCR Screening     Status: None   Collection Time: 03/16/21  5:06 PM   Specimen: Nasal Mucosa; Nasopharyngeal  Result Value Ref Range Status   MRSA by PCR NEGATIVE NEGATIVE Final    Comment:        The GeneXpert MRSA Assay (FDA approved for NASAL specimens only), is one component of a comprehensive MRSA colonization surveillance program. It is not intended to diagnose MRSA infection nor to guide or monitor treatment for MRSA infections. Performed at [facility], [address] [place], [place] [address]   Culture, Urine     Status: Abnormal   Collection Time: 03/17/21 12:08 PM   Specimen: Urine, Clean Catch  Result Value Ref Range Status   Specimen Description   Final    URINE, CLEAN CATCH Performed at [facility], [address], [place], [place] [address]    Special Requests   Final    NONE Performed at [facility], [address], [place], [place] [address]    Culture >=100,000 COLONIES/mL ESCHERICHIA COLI (A)  Final   Report Status 03/19/2021 FINAL  Final   Organism  ID, Bacteria ESCHERICHIA COLI (A)  Final      Susceptibility   Escherichia coli - MIC*    AMPICILLIN 4 SENSITIVE Sensitive     CEFAZOLIN <=4 SENSITIVE Sensitive     CEFEPIME <=0.12 SENSITIVE Sensitive     CEFTRIAXONE <=0.25 SENSITIVE Sensitive     CIPROFLOXACIN <=0.25 SENSITIVE Sensitive     GENTAMICIN <=1 SENSITIVE Sensitive     IMIPENEM <=0.25 SENSITIVE Sensitive     NITROFURANTOIN <=16 SENSITIVE Sensitive     TRIMETH/SULFA <=20 SENSITIVE Sensitive     AMPICILLIN/SULBACTAM <=2 SENSITIVE Sensitive     PIP/TAZO <=4 SENSITIVE Sensitive     * >=100,000 COLONIES/mL ESCHERICHIA COLI  Culture, blood (routine x 2)     Status: None   Collection Time: 03/17/21  1:18 PM   Specimen: BLOOD  Result Value Ref Range Status   Specimen Description BLOOD LEFT ANTECUBITAL  Final   Special Requests   Final    Blood Culture adequate volume BOTTLES DRAWN AEROBIC AND ANAEROBIC   Culture   Final    NO GROWTH 5 DAYS Performed at [facility], [address], [place], [place] [address]    Report Status 03/22/2021 FINAL  Final  Culture, blood (routine x 2)     Status: None   Collection Time: 03/17/21  1:19 PM   Specimen: BLOOD LEFT HAND  Result Value Ref Range Status   Specimen Description BLOOD LEFT HAND  Final   Special Requests   Final    Blood Culture results may not be optimal due to an inadequate volume of blood received in culture bottles BOTTLES DRAWN AEROBIC ONLY   Culture   Final    NO GROWTH 5 DAYS Performed at [facility], [address] [place], [place] [address]    Report Status 03/22/2021 FINAL  Final  Aerobic/Anaerobic Culture (surgical/deep wound)     Status: None   Collection Time: 03/19/21  2:51 PM   Specimen: Abdomen; Abdominal Fluid  Result Value Ref Range Status   Specimen Description ABDOMEN  Final   Special Requests GALLBLADDER  Final   Gram Stain NO WBC SEEN NO ORGANISMS SEEN   Final   Culture   Final    No growth aerobically or anaerobically. Performed at [facility], [place],  [place] [address]    Report Status 03/24/2021 FINAL  Final    RADIOLOGY STUDIES/RESULTS: No results found.   LOS: 10 days   Oren Binet, MD  [facility]    To contact the attending provider between 7A-7P or the covering provider during after hours 7P-7A, please log into the web site [email or web address] and access using universal [place] password for that web site. If you do not have the password, please call the hospital operator.  03/26/2021, 11:35 AM

## 2021-03-26 NOTE — Progress Notes (Signed)
Inpatient Rehab Admissions Coordinator Note:   Per PT recommendations, pt was screened for CIR candidacy by Shann Medal, PT, DPT.  At this time note OT recommending SNF, but re-eval pending.  I will place a CIR consult per our protocol to evaluate for candidacy. Please contact me with questions.   Shann Medal, PT, DPT [phone] 03/26/21 11:29 AM

## 2021-03-26 NOTE — Progress Notes (Signed)
Referring Physician(s): Elgergawy,D  Supervising Physician: Daryll Brod  Patient Status:  [facility] - In-pt  Chief Complaint:  Abdominal pain, cholecystitis  Subjective: Pt doing fairly well today; has some RUQ soreness but not worsening; denies N/V; still with fatigue   Allergies: Patient has no known allergies.  Medications: Prior to Admission medications   Medication Sig Start Date End Date Taking? Authorizing Provider  apixaban (ELIQUIS) 5 MG TABS tablet Take 5 mg by mouth 2 (two) times daily.   Yes [provider]  ezetimibe (ZETIA) 10 MG tablet Take 10 mg by mouth daily.   Yes [provider]  furosemide (LASIX) 20 MG tablet Take 20 mg by mouth.   Yes [provider]  glipiZIDE (GLUCOTROL XL) 5 MG 24 hr tablet Take 5 mg by mouth daily with breakfast.   Yes [provider]  insulin glargine (LANTUS) 100 UNIT/ML injection Inject 50 Units into the skin at bedtime.   Yes [provider]  insulin NPH-regular (NOVOLIN 70/30) (70-30) 100 UNIT/ML injection Inject 10 Units into the skin 3 (three) times daily after meals.   Yes [provider]     Vital Signs: BP (!) 124/55 (BP Location: Right Arm)   Pulse 68   Temp 98.1 F (36.7 C) (Oral)   Resp 17   Ht 5\' 6"  (1.676 m)   Wt 215 lb 6.2 oz (97.7 kg)   SpO2 93%   BMI 34.76 kg/m   Physical Exam awake/alert; GB drain intact, insertion site ok, mildly tender, OP 80 cc yellow bile, no hemobilia; drain flushed without difficulty  Imaging: No results found.  Labs:  CBC: Recent Labs    03/22/21 0219 03/23/21 0231 03/24/21 0113 03/25/21 0217  WBC 22.6* 23.5* 19.3* 16.9*  HGB 6.2* 6.8* 6.4* 6.6*  HCT 20.0* 22.0* 21.1* 21.0*  PLT 266 270 298 319    COAGS: Recent Labs    03/18/21 0630 03/19/21 0504 03/20/21 0147 03/21/21 0115  INR 1.6* 1.4* 1.6* 1.5*    BMP: Recent Labs    03/23/21 0231 03/24/21 0113 03/25/21 0217 03/26/21 0055  NA 135 132* 129* 133*   K 3.4* 3.7 3.8 3.6  CL 107 104 101 103  CO2 17* 17* 17* 21*  GLUCOSE 159* 143* 190* 102*  BUN 79* 73* 70* 63*  CALCIUM 6.7* 6.9* 7.1* 7.3*  CREATININE 3.25* 2.88* 3.23* 2.94*  GFRNONAA 15* 17* 15* 17*    LIVER FUNCTION TESTS: Recent Labs    03/23/21 0231 03/24/21 0113 03/25/21 0217 03/26/21 0055  BILITOT 0.9 1.0 1.1 1.0  AST 106* 62* 34 27  ALT 537* 370* 272* 200*  ALKPHOS 53 47 45 39  PROT 5.6* 5.5* 5.6* 5.7*  ALBUMIN 2.0* 1.9* 1.9* 1.8*    Assessment and Plan: Pt with hx acute calculus cholecystitis, poor surgical candidate; s/p perc GB drain 3/24; afebrile; t bili nl; creat 2.94(3.23), bile cx neg; cont current tx/drain irrigation/OP monitoring; drain will need to remain in place at least 4-6 weeks unless removed surgically in interim; f/u cholangiogram/drain exchange ordered in 6-8 weeks; if problems occur with drain in the meantime can evaluate with cholangiogram sooner   Electronically Signed: D. Rowe Robert, PA-C 03/26/2021, 2:31 PM   I spent a total of 15 minutes at the the patient's bedside AND on the patient's hospital floor or unit, greater than 50% of which was counseling/coordinating care for gallbladder drain    Patient ID: Tracey Bryant, female   DOB: Aug 18, 1951, 70 y.o.  MRN: [id number]

## 2021-03-26 NOTE — Progress Notes (Signed)
Nutrition Follow-up  DOCUMENTATION CODES:   Obesity unspecified  INTERVENTION:    D/C Nepro and Glucerna supplements, patient is not drinking.  Continue sugar free Carnation breakfast essentials mixed with 2% milk TID, each supplement provides 260 kcal, 13 gm protein.  NUTRITION DIAGNOSIS:   Inadequate oral intake related to inability to eat,lethargy/confusion,decreased appetite as evidenced by meal completion < 50%,per patient/family report.  Ongoing   GOAL:   Patient will meet greater than or equal to 90% of their needs  Progressing   MONITOR:   PO intake,Supplement acceptance,Labs,Diet advancement  REASON FOR ASSESSMENT:   Malnutrition Screening Tool    ASSESSMENT:   70 yo female with a PMH of T2DM, cardiomyopathy, HTN, and stroke presents with DKA and AKI after 2-day hx of N/V and generalized weakness.  Unable to speak with patient; she was working with physical therapy.  Currently on a heart healthy carbohydrate modified diet. Meal intakes: 25-100% of meals, average 60%  Glucerna Shake and Nepro were ordered TID each. Patient is refusing both. She is receiving [employer]entials TID.  Labs reviewed. Na 133 CBG: 132-111-75  Medications reviewed and include Lasix, Novolog, Lantus, MVI with minerals, sodium bicarb, Flomax, Feraheme weekly.   Diet Order:   Diet Order            Diet heart healthy/carb modified Room service appropriate? Yes; Fluid consistency: Thin; Fluid restriction: 1500 mL Fluid  Diet effective now                 EDUCATION NEEDS:   Not appropriate for education at this time  Skin:  Skin Assessment: Reviewed RN Assessment  Last BM:  3/30  Height:   Ht Readings from Last 1 Encounters:  03/16/21 5\' 6"  (1.676 m)    Weight:   Wt Readings from Last 1 Encounters:  03/25/21 97.7 kg    Ideal Body Weight:  59.1 kg  BMI:  Body mass index is 34.76 kg/m.  Estimated Nutritional Needs:   Kcal:   1600-1800  Protein:  75-90 grams  Fluid:  >1.6 L    Lucas Mallow, RD, LDN, CNSC Please refer to Amion for contact information.

## 2021-03-26 NOTE — TOC Progression Note (Signed)
Transition of Care [facility]) - Progression Note    Patient Details  Name: Tracey Bryant MRN: [id number] Date of Birth: [birth year]  [facility] CM/SW Contact  Loletha Grayer Beverely Pace, RN Phone Number: 03/26/2021, 3:18 PM  Clinical Narrative:  Case Manager spoke with patient's son, Tracey Bryant [phone], to discuss discharge plans and Home Health. Tracey Bryant says that his mom will go stay with her sister for recovery period but right now he is more concerned with her overall condition. He is very concerned about her low hemoglobin and knows she refuses transfusions. Tracey Bryant said that he will be talking with his mom and aunt concerning what will be best option, he is also hoping that patient may be considered for CIR. CM assured him that someone from the [facility] Team will be following up with him as we continue to monitor for disposition.    Expected Discharge Plan: [place] Barriers to Discharge: Continued Medical Work up  Expected Discharge Plan and Services Expected Discharge Plan: [place] In-house Referral: Clinical Social Work     Living arrangements for the past 2 months: Single Family Home                                       Social Determinants of Health (SDOH) Interventions    Readmission Risk Interventions Readmission Risk Prevention Plan 03/19/2021 03/17/2021  Transportation Screening - Complete  PCP or Specialist Appt within 5-7 Days Complete -  Home Care Screening Complete -  Medication Review (RN CM) - Complete  Some recent data might be hidden

## 2021-03-26 NOTE — Evaluation (Signed)
Physical Therapy Evaluation Patient Details Name: Tracey Bryant MRN: [id number] DOB: 27-Feb-1951 Today's Date: 03/26/2021   History of Present Illness  70 y.o. female with presented to [facility] ED on 3/21  with nausea/vomiting/abdominal pain-further evaluation revealed acute calculus cholecystitis. Transferred to [facility] 3/24 for percutaneous gallbladder drain placement  Hospital course complicated by AKI, multifactorial anemia, and volume overload PMH: past medical history of DM-2, HTN, A. fib, chronic systolic heart failure, s/p permanent pacemaker placement  Clinical Impression  PTA pt living with son, however plan is to live with sisters, one of which is a caregiver, they can provide 24 hour assist. Sister's house is single story with 3-5 steps to enter. Pt was able to walk limited community distances with Rollator and performed her own bathing and dressing, son provided cooking, cleaning, medication management and driving. Pt is currently limited in safe mobility by dizziness likely from very low Hgb, as well as R abdominal pain and generalized weakness. Pt is very motivated to work with therapy, and is disappointed when she gets dizzy with movement. Pt is min guard for supine to sit, mod A for sit>stand which she cannot maintain due to dizziness, and min A for lateral scoot and return to supine. Given family support and pt desire to return to PLOF PT recommends CIR level rehab. PT will continue to follow acutely.     Follow Up Recommendations CIR    Equipment Recommendations  None recommended by PT       Precautions / Restrictions Precautions Precautions: Fall Precaution Comments: monitor BP, hemoglobin Restrictions Weight Bearing Restrictions: No      Mobility  Bed Mobility Overal bed mobility: Needs Assistance Bed Mobility: Supine to Sit;Sit to Supine     Supine to sit: Min guard;HOB elevated Sit to supine: Min assist   General bed mobility comments: min guard for safety in  getting to EoB, min A for returning LE to bed, requires pad scoot to HoB    Transfers Overall transfer level: Needs assistance Equipment used: Rolling walker (2 wheeled);1 person hand held assist Transfers: Sit to/from [facility];Lateral/Scoot Transfers Sit to Stand: Mod assist;From elevated surface        Lateral/Scoot Transfers: Min assist General transfer comment: pt requires modA to achieve fully upright, experiences increased dizziness and knee buckling and need assist for safe return to seated at EoB, once pt feeling less dizziness able to laterally scoot 1 foot towards HoB before giving out and needing assist to return to supine  Ambulation/Gait             General Gait Details: unable due to dizziness        Balance Overall balance assessment: Needs assistance Sitting-balance support: Feet supported;Bilateral upper extremity supported;No upper extremity supported;Single extremity supported Sitting balance-Leahy Scale: Fair     Standing balance support: Bilateral upper extremity supported;During functional activity Standing balance-Leahy Scale: Poor Standing balance comment: requires B UE support and assist to maintain standing                             Pertinent Vitals/Pain Pain Assessment: 0-10 Pain Score: 6  Pain Location: stomach Pain Descriptors / Indicators: Tightness Pain Intervention(s): Limited activity within patient's tolerance;Monitored during session;Repositioned    Home Living Family/patient expects to be discharged to:: Private residence Living Arrangements: Other relatives Available Help at Discharge: Family;Available 24 hours/day Type of Home: House Home Access: Stairs to enter Entrance Stairs-Rails: Right Entrance Stairs-Number  of Steps: 3 Home Layout: One level Home Equipment: [place] - 4 wheels;Shower seat;Bedside commode      Prior Function Level of Independence: Independent with assistive  device(s);Needs assistance   Gait / Transfers Assistance Needed: Pt reports using rollator walker for home ambulation and limited community ambulation, using w/c to get into/out of doctor office  ADL's / Homemaking Assistance Needed: Pt reports independent with bathing, dressing, light cooking; son performs cooking, cleaning, driving  Comments: denies falls     Hand Dominance   Dominant Hand: Right    Extremity/Trunk Assessment   Upper Extremity Assessment Upper Extremity Assessment: Defer to OT evaluation    Lower Extremity Assessment Lower Extremity Assessment: Generalized weakness       Communication   Communication: No difficulties  Cognition Arousal/Alertness: Awake/alert Behavior During Therapy: Flat affect Overall Cognitive Status: Within Functional Limits for tasks assessed                                        General Comments General comments (skin integrity, edema, etc.): Pt dizzy with positional change mainly due to activity in presence of low Hgb, BP 123/59 in supine and 120/65 in sitting        Assessment/Plan    PT Assessment Patient needs continued PT services  PT Problem List Decreased strength;Decreased activity tolerance;Decreased balance;Decreased mobility;Decreased cognition;Decreased coordination;Decreased knowledge of use of DME;Decreased safety awareness;Cardiopulmonary status limiting activity;Pain       PT Treatment Interventions DME instruction;Gait training;Stair training;Functional mobility training;Therapeutic activities;Therapeutic exercise;Balance training;Cognitive remediation;Patient/family education    PT Goals (Current goals can be found in the Care Plan section)  Acute Rehab PT Goals Patient Stated Goal: to get to her sisters house PT Goal Formulation: With patient Time For Goal Achievement: 04/09/21 Potential to Achieve Goals: Good    Frequency Min 3X/week    AM-PAC PT "6 Clicks" Mobility  Outcome  Measure Help needed turning from your back to your side while in a flat bed without using bedrails?: A Little Help needed moving from lying on your back to sitting on the side of a flat bed without using bedrails?: A Little Help needed moving to and from a bed to a chair (including a wheelchair)?: A Lot Help needed standing up from a chair using your arms (e.g., wheelchair or bedside chair)?: A Lot Help needed to walk in hospital room?: A Lot Help needed climbing 3-5 steps with a railing? : Total 6 Click Score: 13    End of Session Equipment Utilized During Treatment: Gait belt Activity Tolerance: Treatment limited secondary to medical complications (Comment) Patient left: in bed;with call bell/phone within reach;with bed alarm set Nurse Communication: Mobility status;Other (comment) (dizziness with transfers) PT Visit Diagnosis: Unsteadiness on feet (R26.81);Other abnormalities of gait and mobility (R26.89);Muscle weakness (generalized) (M62.81)    Time: 9629-5284 PT Time Calculation (min) (ACUTE ONLY): 55 min   Charges:   PT Evaluation $PT Eval Moderate Complexity: 1 Mod PT Treatments $Therapeutic Activity: 23-37 mins        Sharee Sturdy B. Migdalia Dk PT, DPT Acute Rehabilitation Services Pager [phone] Office [phone]   [place] 03/26/2021, 10:58 AM

## 2021-03-27 ENCOUNTER — Inpatient Hospital Stay (HOSPITAL_COMMUNITY): Payer: Medicare HMO

## 2021-03-27 DIAGNOSIS — N179 Acute kidney failure, unspecified: Secondary | ICD-10-CM | POA: Diagnosis not present

## 2021-03-27 DIAGNOSIS — K810 Acute cholecystitis: Secondary | ICD-10-CM | POA: Diagnosis not present

## 2021-03-27 DIAGNOSIS — N390 Urinary tract infection, site not specified: Secondary | ICD-10-CM | POA: Diagnosis not present

## 2021-03-27 DIAGNOSIS — E0810 Diabetes mellitus due to underlying condition with ketoacidosis without coma: Secondary | ICD-10-CM | POA: Diagnosis not present

## 2021-03-27 LAB — BASIC METABOLIC PANEL
Anion gap: 13 (ref 5–15)
BUN: 62 mg/dL — ABNORMAL HIGH (ref 8–23)
CO2: 20 mmol/L — ABNORMAL LOW (ref 22–32)
Calcium: 7.8 mg/dL — ABNORMAL LOW (ref 8.9–10.3)
Chloride: 101 mmol/L (ref 98–111)
Creatinine, Ser: 2.98 mg/dL — ABNORMAL HIGH (ref 0.44–1.00)
GFR, Estimated: 16 mL/min — ABNORMAL LOW (ref >60)
Glucose, Bld: 116 mg/dL — ABNORMAL HIGH (ref 70–99)
Potassium: 3.5 mmol/L (ref 3.5–5.1)
Sodium: 134 mmol/L — ABNORMAL LOW (ref 135–145)

## 2021-03-27 LAB — GLUCOSE, CAPILLARY
Glucose-Capillary: 105 mg/dL — ABNORMAL HIGH (ref 70–99)
Glucose-Capillary: 171 mg/dL — ABNORMAL HIGH (ref 70–99)
Glucose-Capillary: 123 mg/dL — ABNORMAL HIGH (ref 70–99)
Glucose-Capillary: 144 mg/dL — ABNORMAL HIGH (ref 70–99)

## 2021-03-27 LAB — CBC
HCT: 22.9 % — ABNORMAL LOW (ref 36.0–46.0)
Hemoglobin: 7.1 g/dL — ABNORMAL LOW (ref 12.0–15.0)
MCH: 30.1 pg (ref 26.0–34.0)
MCHC: 31 g/dL (ref 30.0–36.0)
MCV: 97 fL (ref 80.0–100.0)
Platelets: 360 10*3/uL (ref 150–400)
RBC: 2.36 MIL/uL — ABNORMAL LOW (ref 3.87–5.11)
RDW: 22 % — ABNORMAL HIGH (ref 11.5–15.5)
WBC: 15.6 10*3/uL — ABNORMAL HIGH (ref 4.0–10.5)
nRBC: 0 % (ref 0.0–0.2)

## 2021-03-27 IMAGING — DX DG CHEST 1V PORT
1 series · 1 of 1 positions shown · non-contrast
Comparison: [DATE]

CLINICAL DATA: Shortness of breath

EXAM:
PORTABLE CHEST 1 VIEW

[chest]
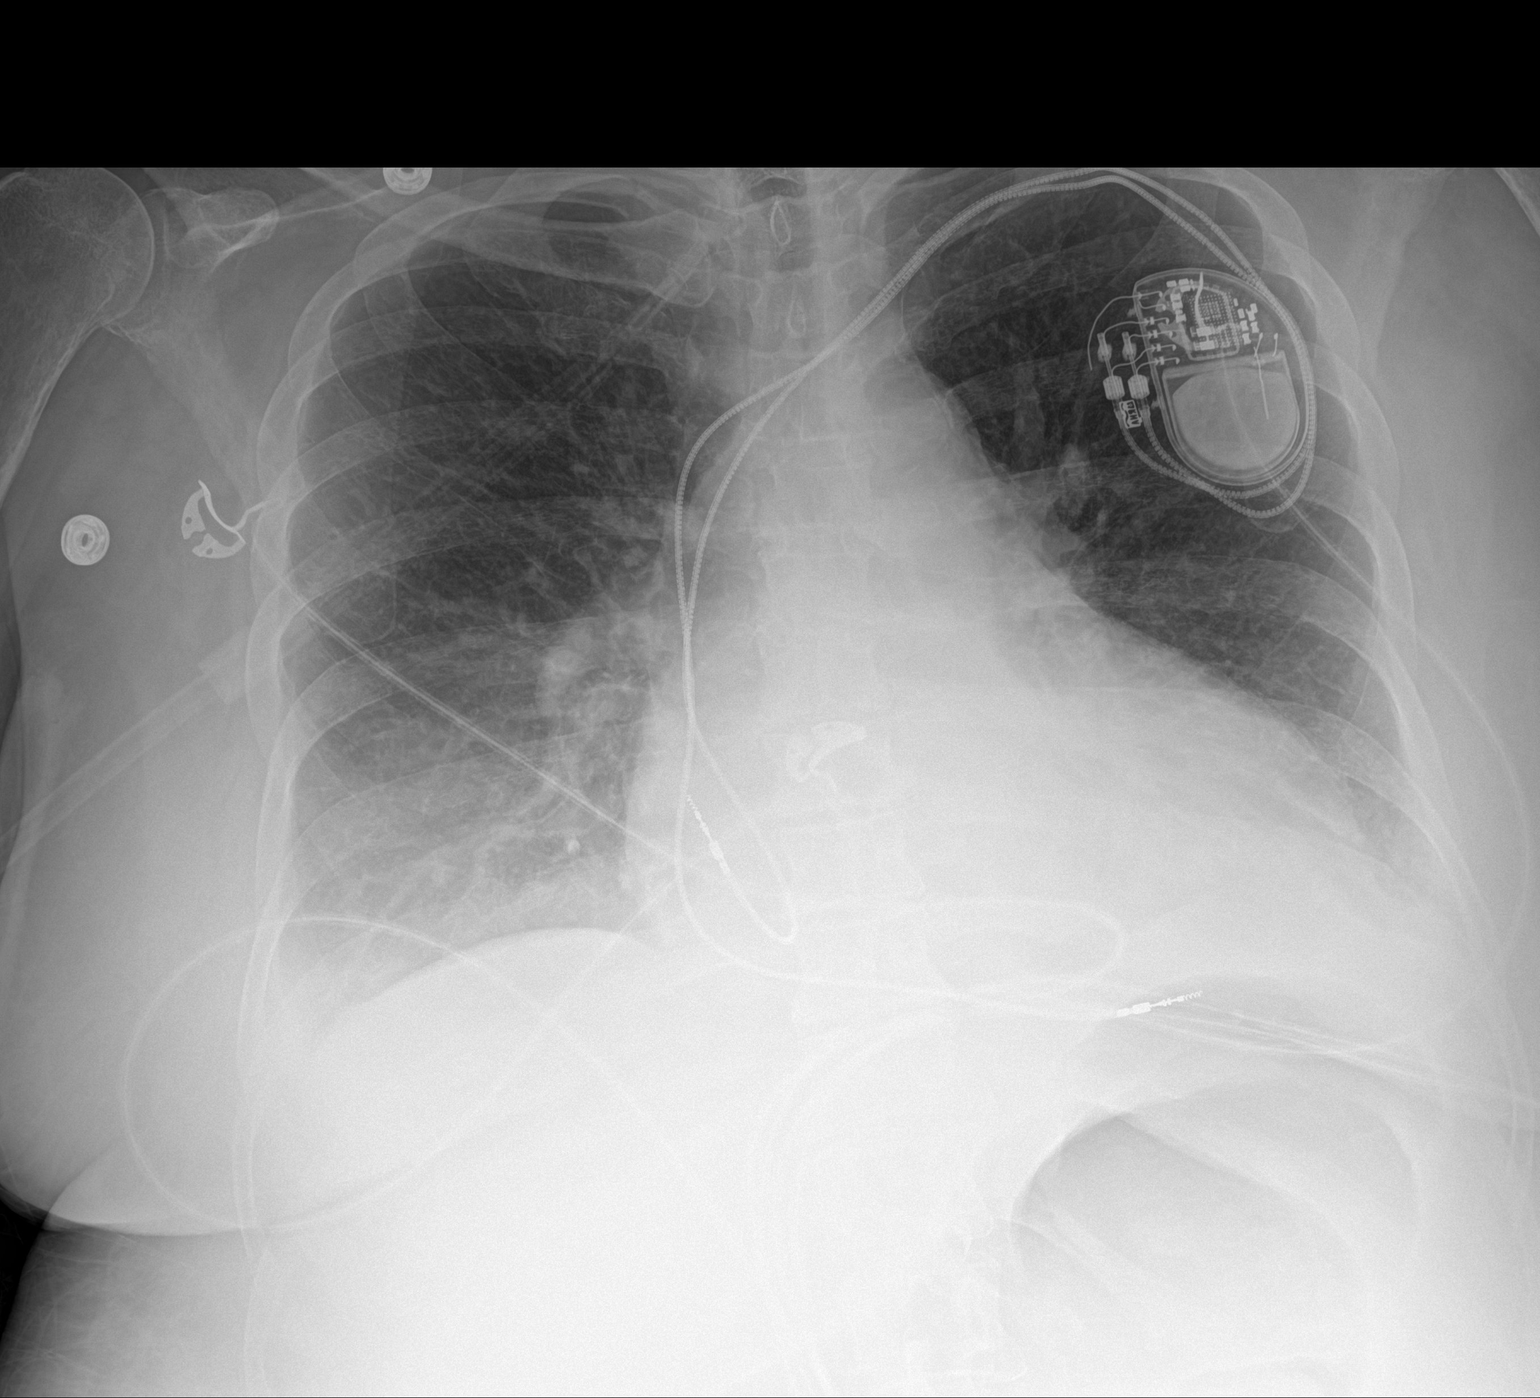

[1 of 1 positions shown; findings below may reference images not displayed]

FINDINGS: Left pacer remains in place, unchanged. Cardiomegaly. Small
bilateral pleural effusions with bibasilar atelectasis. No overt
edema. No acute bony abnormality.
IMPRESSION: Small effusions with bibasilar atelectasis.

## 2021-03-27 MED ORDER — HEPARIN SODIUM (PORCINE) 5000 UNIT/ML IJ SOLN
5000.0000 [IU] | Freq: Three times a day (TID) | INTRAMUSCULAR | Status: DC
  Administered 2021-03-27 – 2021-03-28 (×2): 5000 [IU] via SUBCUTANEOUS
  Filled 2021-03-27 (×2): qty 1

## 2021-03-27 NOTE — Care Management Important Message (Signed)
Important Message  Patient Details  Name: Nhyira D Quast MRN: [id number] Date of Birth: 1951/08/15   Medicare Important Message Given:  Yes - Important Message mailed due to current National Emergency   Verbal consent obtained due to current National Emergency  Relationship to patient: Self Contact Name: Hanae Egner Call Date: 03/27/21  Time: 1108 Phone: [phone] Outcome: No Answer/Busy      Kingsly Kloepfer P Laylonie Marzec 03/27/2021, 11:08 AM

## 2021-03-27 NOTE — Progress Notes (Signed)
    Referring Physician(s): Elgergawy,D  Supervising Physician: Jacqulynn Cadet  Patient Status:  [facility] - In-pt  Chief Complaint: Abdominal pain, cholecystitis  Subjective: Alert, sitting up in bed.  Conversant.  Mild tenderness under the drain site, otherwise intact.   Allergies: Patient has no known allergies.  Medications: Prior to Admission medications   Medication Sig Start Date End Date Taking? Authorizing Provider  apixaban (ELIQUIS) 5 MG TABS tablet Take 5 mg by mouth 2 (two) times daily.   Yes [provider]  ezetimibe (ZETIA) 10 MG tablet Take 10 mg by mouth daily.   Yes [provider]  furosemide (LASIX) 20 MG tablet Take 20 mg by mouth.   Yes [provider]  glipiZIDE (GLUCOTROL XL) 5 MG 24 hr tablet Take 5 mg by mouth daily with breakfast.   Yes [provider]  insulin glargine (LANTUS) 100 UNIT/ML injection Inject 50 Units into the skin at bedtime.   Yes [provider]  insulin NPH-regular (NOVOLIN 70/30) (70-30) 100 UNIT/ML injection Inject 10 Units into the skin 3 (three) times daily after meals.   Yes [provider]     Vital Signs: BP 115/72 (BP Location: Left Wrist)   Pulse 70   Temp 98.2 F (36.8 C) (Oral)   Resp 16   Ht 5\' 6"  (1.676 m)   Wt 210 lb 15.7 oz (95.7 kg)   SpO2 93%   BMI 34.05 kg/m   Physical Exam  NAD, alert, eating lunch Abdomen:  Perc chole in place. Insertion site stable. Mildly tender.  Bilious output in bag.   Imaging: No results found.  Labs:  CBC: Recent Labs    03/23/21 0231 03/24/21 0113 03/25/21 0217 03/27/21 0046  WBC 23.5* 19.3* 16.9* 15.6*  HGB 6.8* 6.4* 6.6* 7.1*  HCT 22.0* 21.1* 21.0* 22.9*  PLT 270 298 319 360    COAGS: Recent Labs    03/18/21 0630 03/19/21 0504 03/20/21 0147 03/21/21 0115  INR 1.6* 1.4* 1.6* 1.5*    BMP: Recent Labs    03/24/21 0113 03/25/21 0217 03/26/21 0055 03/27/21 0046  NA 132* 129* 133* 134*  K 3.7 3.8  3.6 3.5  CL 104 101 103 101  CO2 17* 17* 21* 20*  GLUCOSE 143* 190* 102* 116*  BUN 73* 70* 63* 62*  CALCIUM 6.9* 7.1* 7.3* 7.8*  CREATININE 2.88* 3.23* 2.94* 2.98*  GFRNONAA 17* 15* 17* 16*    LIVER FUNCTION TESTS: Recent Labs    03/23/21 0231 03/24/21 0113 03/25/21 0217 03/26/21 0055  BILITOT 0.9 1.0 1.1 1.0  AST 106* 62* 34 27  ALT 537* 370* 272* 200*  ALKPHOS 53 47 45 39  PROT 5.6* 5.5* 5.6* 5.7*  ALBUMIN 2.0* 1.9* 1.9* 1.8*    Assessment and Plan: Acute calculus cholecystitis, poor surgical candidate; s/p perc GB drain 3/24 Afebrile.  Sitting up in bed, eating lunch, conversant.  Follow-up order has already been put in place; drain will need to remain in place at least 4-6 weeks unless removed surgically in interim; f/u cholangiogram/drain exchange ordered in 6-8 weeks; if problems occur with drain in the meantime can evaluate with cholangiogram sooner  Continue current management.   Electronically Signed: Docia Barrier, PA 03/27/2021, 4:19 PM   I spent a total of 15 minutes at the the patient's bedside AND on the patient's hospital floor or unit, greater than 50% of which was counseling/coordinating care for acute cholecystitis.

## 2021-03-27 NOTE — Progress Notes (Signed)
Pt having increased labored breathing. Dr. Sloan Leiter notified. Pt placed on 2L via [place]. I requested correct 02 tubing from [facility] and they are supposed to be bringing some to the unit. I also spoke with Butch Penny from [facility] about a PRN breathing tx for the pt. CXR ordered for pt and 2000 Lasix to be given early.

## 2021-03-27 NOTE — Plan of Care (Signed)
  Problem: Education: Goal: Knowledge of General Education information will improve Description: Including pain rating scale, medication(s)/side effects and non-pharmacologic comfort measures Outcome: Progressing   Problem: Clinical Measurements: Goal: Ability to maintain clinical measurements within normal limits will improve Outcome: Progressing Goal: Will remain free from infection Outcome: Progressing Goal: Diagnostic test results will improve Outcome: Progressing Goal: Respiratory complications will improve Outcome: Progressing Goal: Cardiovascular complication will be avoided Outcome: Progressing   Problem: Activity: Goal: Risk for activity intolerance will decrease Outcome: Progressing   Problem: Pain Managment: Goal: General experience of comfort will improve Outcome: Progressing   Problem: Skin Integrity: Goal: Risk for impaired skin integrity will decrease Outcome: Progressing   Problem: Fluid Volume: Goal: Hemodynamic stability will improve Outcome: Progressing

## 2021-03-27 NOTE — Progress Notes (Signed)
Inpatient Rehabilitation Admissions Coordinator  Inpatient rehab consult received.  I met at bedside with patient and spoke with her son, [name], by phone. Patient not able to tolerate the intensity required of a CIR admit. They are in agreement , but would like to discuss their options of SNF vs home with her sister with [facility]. I have alerted acute team and TOC. We will sign off at this time.  Danne Baxter, RN, MSN Rehab Admissions Coordinator [phone] 03/27/2021 10:44 AM

## 2021-03-27 NOTE — Progress Notes (Signed)
PROGRESS NOTE        PATIENT DETAILS Name: Tracey Bryant Age: 70 y.o. Sex: female Date of Birth: 05-01-1951 Admit Date: 03/16/2021 Admitting Physician Deatra James, MD NWG:[id number], Arlyn Leak, FNP  Brief Narrative: Patient is a 70 y.o. female with past medical history of DM-2, HTN, A. fib, chronic systolic heart failure, s/p permanent pacemaker placement-presented to the ED on 3/21  with nausea/vomiting/abdominal pain-further evaluation revealed acute calculus cholecystitis.  Hospital course complicated by AKI, multifactorial anemia, and volume overload.  See below for further details  Significant events: 3/20>> ED visit for epistaxis 3/21>> admit to [facility] for acute calculus cholecystitis 3/24>> transfer to [facility] for percutaneous gallbladder drain placement  Significant studies: 3/22 >> CT abdomen/pelvis: Cholelithiasis, bilateral small pleural effusions, ascites with anasarca 3/23>> RUQ ultrasound: Acute calculus cholecystitis 3/23>> Echo: EF 25-30%, decreased RV systolic function 8/65>> mild central pulmonary vascular congestion.  Antimicrobial therapy: Zosyn: 3/22>>3/31  Microbiology data: 3/22>> urine culture: E. Coli 3/22>> blood culture: No growth 3/24>> gallbladder/bile culture: No growth  Procedures : 3/24>> CT-guided cholecystostomy tube placement  Consults: IR, general surgery, nephrology, gastroenterology  DVT Prophylaxis : Place and maintain sequential compression device Start: 03/21/21 1323 Place and maintain sequential compression device Start: 03/20/21 1411 TED hose Start: 03/16/21 1441 SCDs Start: 03/16/21 1441    Subjective: Feels much better today-still tired and fatigued but improving per patient.  Assessment/Plan: Severe sepsis due to acute calculus cholecystitis and UTI: Sepsis physiology has resolved-culture data as above-will complete 10 days of Zosyn on 3/31.   Acute calculus cholecystitis:evaluated by general surgery  at [facility]-not a surgical candidate-hence percutaneous cholecystostomy tube placed.  IR following.  Transaminitis: Multifactorial-probably due to sepsis/acute cholecystitis-downtrending-continue to follow periodically.  AKI on CKD stage IIIa: AKI likely hemodynamically mediated-slowly improving-has plateaued around 2.9 range-remains volume overloaded-continue IV Lasix.    Acute urinary retention: Foley catheter placed on 3/26-started Flomax on 3/30-we will attempt voiding trial over the weekend.   Hyponatremia: Due to hypervolemia-being with diuretics.  Improving-follow electrolytes periodically.  Anemia: Multifactorial-some component of acute blood loss due to epistaxis that occurred 1 day prior to this hospitalization-on top of anemia due to chronic disease/critical illness.  Also FOBT positive-no overt GI blood loss.  Is a [name] will refuse blood transfusion-even in the face of life-threatening situation (this MD reconfirmed with patient on 3/30).  Acute on chronic systolic/diastolic heart failure: Continues to have significant volume overload-remains on IV Lasix-continue to follow weights/electrolytes/intake/output  PAF: Appears to be in sinus rhythm-Eliquis on hold due to severity of anemia and recent epistaxis.  History of CVA: Nonfocal exam-difficult situation-Eliquis on hold  HLD: On Zetia  DM-2: CBG stable-continue Lantus 20 units daily and SSI.  Recent Labs    03/26/21 2117 03/27/21 0756 03/27/21 1235  GLUCAP 120* 105* 171*    Obesity: Estimated body mass index is 34.05 kg/m as calculated from the following:   Height as of this encounter: 5\' 6"  (1.676 m).   Weight as of this encounter: 95.7 kg.     Diet: Diet Order            Diet heart healthy/carb modified Room service appropriate? Yes; Fluid consistency: Thin; Fluid restriction: 1500 mL Fluid  Diet effective now                  Code Status: Full code  Family  Communication: Son-[phone]-updated on the phone on 3/31  Disposition Plan: Status is: Inpatient  Remains inpatient appropriate because:Inpatient level of care appropriate due to severity of illness   Dispo: The patient is from: Home              Anticipated d/c is to: SNF              Patient currently is not medically stable to d/c.   Difficult to place patient No    Barriers to Discharge: Sepsis due to acute calculus cholecystitis-on IV Zosyn-now with significant volume overload-starting IV diuretics  Antimicrobial agents: Anti-infectives (From admission, onward)   Start     Dose/Rate Route Frequency Ordered Stop   03/26/21 2200  piperacillin-tazobactam (ZOSYN) IVPB 3.375 g        3.375 g 12.5 mL/hr over 240 Minutes Intravenous Every 12 hours 03/26/21 1137 03/27/21 0158   03/18/21 2200  piperacillin-tazobactam (ZOSYN) IVPB 3.375 g  Status:  Discontinued        3.375 g 12.5 mL/hr over 240 Minutes Intravenous Every 12 hours 03/18/21 1201 03/26/21 1137   03/17/21 1400  piperacillin-tazobactam (ZOSYN) IVPB 3.375 g  Status:  Discontinued        3.375 g 12.5 mL/hr over 240 Minutes Intravenous Every 8 hours 03/17/21 1246 03/18/21 1201       Time spent: 35- minutes-Greater than 50% of this time was spent in counseling, explanation of diagnosis, planning of further management, and coordination of care.  MEDICATIONS: Scheduled Meds: . Chlorhexidine Gluconate Cloth  6 each Topical Daily  . ezetimibe  10 mg Oral Daily  . furosemide  80 mg Intravenous Q12H  . insulin aspart  0-15 Units Subcutaneous TID WC  . insulin aspart  0-5 Units Subcutaneous QHS  . insulin glargine  20 Units Subcutaneous Daily  . multivitamin with minerals  1 tablet Oral Daily  . sodium bicarbonate  1,300 mg Oral TID  . sodium chloride flush  3 mL Intravenous Q12H  . sodium chloride flush  5 mL Intracatheter Q8H  . tamsulosin  0.4 mg Oral Daily   Continuous Infusions: . ferumoxytol Stopped  (03/20/21 2015)   PRN Meds:.bisacodyl, dextrose, HYDROmorphone (DILAUDID) injection, ipratropium, levalbuterol, ondansetron **OR** ondansetron (ZOFRAN) IV, oxyCODONE, senna-docusate, traZODone   PHYSICAL EXAM: Vital signs: Vitals:   03/27/21 0442 03/27/21 0639 03/27/21 0753 03/27/21 1231  BP:   123/66 115/72  Pulse:   70   Resp:   16 16  Temp:   97.7 F (36.5 C) 98.2 F (36.8 C)  TempSrc:   Oral Oral  SpO2:   90% 93%  Weight: 97.5 kg 95.7 kg    Height:       Filed Weights   03/25/21 0500 03/27/21 0442 03/27/21 0639  Weight: 97.7 kg 97.5 kg 95.7 kg   Body mass index is 34.05 kg/m.   Gen Exam:Alert awake-not in any distress HEENT:atraumatic, normocephalic Chest: B/L clear to auscultation anteriorly CVS:S1S2 regular Abdomen:soft non tender, non distended Extremities:++ edema Neurology: Non focal Skin: no rash  I have personally reviewed following labs and imaging studies  LABORATORY DATA: CBC: Recent Labs  Lab 03/22/21 0219 03/23/21 0231 03/24/21 0113 03/25/21 0217 03/27/21 0046  WBC 22.6* 23.5* 19.3* 16.9* 15.6*  HGB 6.2* 6.8* 6.4* 6.6* 7.1*  HCT 20.0* 22.0* 21.1* 21.0* 22.9*  MCV 93.0 95.7 97.2 96.8 97.0  PLT 266 270 298 319 854    Basic Metabolic Panel: Recent Labs  Lab 03/23/21 0231 03/24/21 0113 03/25/21 0217 03/26/21 0055 03/27/21  0046  NA 135 132* 129* 133* 134*  K 3.4* 3.7 3.8 3.6 3.5  CL 107 104 101 103 101  CO2 17* 17* 17* 21* 20*  GLUCOSE 159* 143* 190* 102* 116*  BUN 79* 73* 70* 63* 62*  CREATININE 3.25* 2.88* 3.23* 2.94* 2.98*  CALCIUM 6.7* 6.9* 7.1* 7.3* 7.8*  MG 2.4  --   --   --   --     GFR: Estimated Creatinine Clearance: 20.8 mL/min (A) (by C-G formula based on SCr of 2.98 mg/dL (H)).  Liver Function Tests: Recent Labs  Lab 03/22/21 0219 03/23/21 0231 03/24/21 0113 03/25/21 0217 03/26/21 0055  AST 193* 106* 62* 34 27  ALT 652* 537* 370* 272* 200*  ALKPHOS 59 53 47 45 39  BILITOT 0.7 0.9 1.0 1.1 1.0  PROT 5.4* 5.6*  5.5* 5.6* 5.7*  ALBUMIN 2.0* 2.0* 1.9* 1.9* 1.8*   No results for input(s): LIPASE, AMYLASE in the last 168 hours. No results for input(s): AMMONIA in the last 168 hours.  Coagulation Profile: Recent Labs  Lab 03/21/21 0115  INR 1.5*    Cardiac Enzymes: No results for input(s): CKTOTAL, CKMB, CKMBINDEX, TROPONINI in the last 168 hours.  BNP (last 3 results) No results for input(s): PROBNP in the last 8760 hours.  Lipid Profile: No results for input(s): CHOL, HDL, LDLCALC, TRIG, CHOLHDL, LDLDIRECT in the last 72 hours.  Thyroid Function Tests: No results for input(s): TSH, T4TOTAL, FREET4, T3FREE, THYROIDAB in the last 72 hours.  Anemia Panel: No results for input(s): VITAMINB12, FOLATE, FERRITIN, TIBC, IRON, RETICCTPCT in the last 72 hours.  Urine analysis:    Component Value Date/Time   COLORURINE AMBER (A) 03/17/2021 1208   APPEARANCEUR CLOUDY (A) 03/17/2021 1208   LABSPEC 1.015 03/17/2021 1208   PHURINE 5.0 03/17/2021 1208   GLUCOSEU 50 (A) 03/17/2021 1208   HGBUR SMALL (A) 03/17/2021 1208   BILIRUBINUR NEGATIVE 03/17/2021 [place] 03/17/2021 1208   PROTEINUR >=300 (A) 03/17/2021 1208   UROBILINOGEN 0.2 01/17/2013 1910   NITRITE NEGATIVE 03/17/2021 1208   LEUKOCYTESUR LARGE (A) 03/17/2021 1208    Sepsis Labs: Lactic Acid, Venous    Component Value Date/Time   LATICACIDVEN 1.4 03/21/2021 0805    MICROBIOLOGY: Recent Results (from the past 240 hour(s))  Aerobic/Anaerobic Culture (surgical/deep wound)     Status: None   Collection Time: 03/19/21  2:51 PM   Specimen: Abdomen; Abdominal Fluid  Result Value Ref Range Status   Specimen Description ABDOMEN  Final   Special Requests GALLBLADDER  Final   Gram Stain NO WBC SEEN NO ORGANISMS SEEN   Final   Culture   Final    No growth aerobically or anaerobically. Performed at [facility], [place] [address], [place], [place] [address]    Report Status 03/24/2021 FINAL  Final     RADIOLOGY STUDIES/RESULTS: No results found.   LOS: 11 days   Oren Binet, MD  [facility]    To contact the attending provider between 7A-7P or the covering provider during after hours 7P-7A, please log into the web site [email or web address] and access using universal [place] password for that web site. If you do not have the password, please call the hospital operator.  03/27/2021, 1:53 PM

## 2021-03-28 ENCOUNTER — Inpatient Hospital Stay (HOSPITAL_COMMUNITY): Payer: Medicare HMO

## 2021-03-28 DIAGNOSIS — K810 Acute cholecystitis: Secondary | ICD-10-CM | POA: Diagnosis not present

## 2021-03-28 DIAGNOSIS — E0810 Diabetes mellitus due to underlying condition with ketoacidosis without coma: Secondary | ICD-10-CM | POA: Diagnosis not present

## 2021-03-28 DIAGNOSIS — M7989 Other specified soft tissue disorders: Secondary | ICD-10-CM

## 2021-03-28 DIAGNOSIS — I5021 Acute systolic (congestive) heart failure: Secondary | ICD-10-CM | POA: Diagnosis not present

## 2021-03-28 DIAGNOSIS — N390 Urinary tract infection, site not specified: Secondary | ICD-10-CM | POA: Diagnosis not present

## 2021-03-28 LAB — BASIC METABOLIC PANEL
Anion gap: 13 (ref 5–15)
BUN: 58 mg/dL — ABNORMAL HIGH (ref 8–23)
CO2: 21 mmol/L — ABNORMAL LOW (ref 22–32)
Calcium: 7.9 mg/dL — ABNORMAL LOW (ref 8.9–10.3)
Chloride: 100 mmol/L (ref 98–111)
Creatinine, Ser: 2.91 mg/dL — ABNORMAL HIGH (ref 0.44–1.00)
GFR, Estimated: 17 mL/min — ABNORMAL LOW (ref >60)
Glucose, Bld: 130 mg/dL — ABNORMAL HIGH (ref 70–99)
Potassium: 3.1 mmol/L — ABNORMAL LOW (ref 3.5–5.1)
Sodium: 134 mmol/L — ABNORMAL LOW (ref 135–145)

## 2021-03-28 LAB — GLUCOSE, CAPILLARY
Glucose-Capillary: 96 mg/dL (ref 70–99)
Glucose-Capillary: 109 mg/dL — ABNORMAL HIGH (ref 70–99)
Glucose-Capillary: 171 mg/dL — ABNORMAL HIGH (ref 70–99)
Glucose-Capillary: 194 mg/dL — ABNORMAL HIGH (ref 70–99)

## 2021-03-28 LAB — HEPARIN LEVEL (UNFRACTIONATED): Heparin Unfractionated: 0.43 IU/mL (ref 0.30–0.70)

## 2021-03-28 MED ORDER — POTASSIUM CHLORIDE CRYS ER 20 MEQ PO TBCR
40.0000 meq | EXTENDED_RELEASE_TABLET | Freq: Every day | ORAL | Status: DC
  Administered 2021-03-29 – 2021-04-02 (×5): 40 meq via ORAL
  Filled 2021-03-28 (×5): qty 2

## 2021-03-28 MED ORDER — HEPARIN (PORCINE) 25000 UT/250ML-% IV SOLN
1500.0000 [IU]/h | INTRAVENOUS | Status: DC
  Administered 2021-03-28 – 2021-03-29 (×3): 1500 [IU]/h via INTRAVENOUS
  Filled 2021-03-28 (×3): qty 250

## 2021-03-28 MED ORDER — POTASSIUM CHLORIDE CRYS ER 20 MEQ PO TBCR
40.0000 meq | EXTENDED_RELEASE_TABLET | ORAL | Status: AC
  Administered 2021-03-28 (×2): 40 meq via ORAL
  Filled 2021-03-28 (×2): qty 2

## 2021-03-28 MED ORDER — METOLAZONE 5 MG PO TABS
5.0000 mg | ORAL_TABLET | Freq: Once | ORAL | Status: AC
  Administered 2021-03-28: 5 mg via ORAL
  Filled 2021-03-28: qty 1

## 2021-03-28 MED ORDER — POTASSIUM CHLORIDE CRYS ER 20 MEQ PO TBCR: 40.0000 meq | EXTENDED_RELEASE_TABLET | Freq: Every day | ORAL | Status: DC

## 2021-03-28 MED ORDER — DARBEPOETIN ALFA 60 MCG/0.3ML IJ SOSY
60.0000 ug | PREFILLED_SYRINGE | INTRAMUSCULAR | Status: DC
Start: 2021-03-28 — End: 2021-04-08
  Administered 2021-03-28 – 2021-04-04 (×2): 60 ug via SUBCUTANEOUS
  Filled 2021-03-28 (×2): qty 0.3

## 2021-03-28 NOTE — Progress Notes (Addendum)
PROGRESS NOTE        PATIENT DETAILS Name: Tracey Bryant Age: 70 y.o. Sex: female Date of Birth: [birth year] Admit Date: 03/16/2021 Admitting Physician Deatra James, MD VHQ:[id number], Arlyn Leak, FNP  Brief Narrative: Patient is a 70 y.o. female with past medical history of DM-2, HTN, A. fib, chronic systolic heart failure, s/p permanent pacemaker placement-presented to the ED on 3/21  with nausea/vomiting/abdominal pain-further evaluation revealed acute calculus cholecystitis.  Hospital course complicated by AKI, multifactorial anemia, volume overload, age indeterminate left DVT.  See below for further details  Significant events: 3/20>> ED visit for epistaxis 3/21>> admit to [facility] for acute calculus cholecystitis 3/24>> transfer to [facility] for percutaneous gallbladder drain placement  Significant studies: 3/22 >> CT abdomen/pelvis: Cholelithiasis, bilateral small pleural effusions, ascites with anasarca 3/23>> RUQ ultrasound: Acute calculus cholecystitis 3/23>> Echo: EF 25-30%, decreased RV systolic function 2/84>> mild central pulmonary vascular congestion. 4/2>> lower extremity Doppler: Age indeterminate DVT left popliteal vein.  Antimicrobial therapy: Zosyn: 3/22>>3/31  Microbiology data: 3/22>> urine culture: E. Coli 3/22>> blood culture: No growth 3/24>> gallbladder/bile culture: No growth  Procedures : 3/24>> CT-guided cholecystostomy tube placement  Consults: IR, general surgery, nephrology, gastroenterology  DVT Prophylaxis : Place and maintain sequential compression device Start: 03/21/21 1323 Place and maintain sequential compression device Start: 03/20/21 1411 TED hose Start: 03/16/21 1441 SCDs Start: 03/16/21 1441  IV heparin on 4/12   Subjective: Felt short of breath yesterday evening-subsequently oxygen applied.  She feels better this morning.  However continues to acknowledge fatigue and shortness of breath with minimal  movement.  Assessment/Plan: Severe sepsis due to acute calculus cholecystitis and UTI: Sepsis physiology has resolved-culture data as above- completed 10 days of Zosyn on 3/31.   Acute calculus cholecystitis:evaluated by general surgery at [facility]-not a surgical candidate-hence percutaneous cholecystostomy tube placed.  IR following.  Transaminitis: Multifactorial-probably due to sepsis/acute cholecystitis-downtrending-continue to follow periodically.  Acute on chronic systolic/diastolic heart failure: Although has improvement in volume status-still with 2-3+ pitting edema-also continues to have fatigue and exertional dyspnea (also due to anemia as well).  Not weighed this morning-but as noted above-volume status is slowly improving with diuretics.  Continue Lasix-add 1 dose of metolazone today.  Have asked RN to ensure she gets weight daily.  Continue to follow weights/electrolytes/intake/output.    AKI on CKD stage IIIa: AKI likely hemodynamically mediated-slowly improving-creatinine for the past several days has plateaued around 2.9 range.  Remains volume overloaded-remains on IV Lasix.    Acute urinary retention: Foley catheter placed on 3/26-started Flomax on 3/30-attempt a voiding trial today-discontinue Foley catheter.  Hyponatremia: Due to hypervolemia-being with diuretics.  Improving-follow electrolytes periodically.  Anemia: Multifactorial-some component of acute blood loss due to epistaxis that occurred 1 day prior to this hospitalization-on top of anemia due to chronic disease/critical illness.  Also FOBT positive-no overt GI blood loss.  Is a [name] continues to refuse blood transfusion-she confirms that she will refuse blood-even in the face of life-threatening situation.  She is symptomatic-with fatigue and exertional dyspnea (some component of exertional dyspnea is from heart failure as well).  She did receive Aranesp earlier this hospital stay-and is s/p IV iron.  We will  discuss with pharmacy and schedule Aranesp weekly.  Left lower leg DVT: Age indeterminate-but suspect must have occurred this admission as she is maintained on Eliquis-unfortunately Eliquis has been  discontinued due to severity of anemia and history of epistaxis.   Had a long discussion with patient at bedside-and then with patient's son [name] over the phone-explained that patient ideally requires anticoagulation-however given the severity of anemia-if she has a significant bleeding complication from anticoagulation-this could be potentially life-threatening.  Explained that given patient's acute illness-mostly bedbound status due to severity of anemia/fatigue-she remains at risk for more VTE-and subsequent embolic phenomenona to the lungs-which potentially could be life-threatening.  After extensive discussion (RN also present) patient agreeable to start IV heparin-we will ask pharmacy to try to maintain this at the lower therapeutic range.  If she has significant bleeding complications-May require IVC.  Have asked patient to let us know if she reconsiders and is agreeable to a blood transfusion.  PAF: Appears to be in sinus rhythm-Eliquis initially on hold due to severity of anemia and recent history of epistaxis-however now due to diagnosis of DVT-starting IV heparin-see above.  History of CVA: Nonfocal exam-difficult situation-Eliquis on hold  HLD: On Zetia  DM-2: CBG stable-continue Lantus 20 units daily and SSI.  Recent Labs    03/27/21 2028 03/28/21 0750 03/28/21 1145  GLUCAP 144* 96 109*    Obesity: Estimated body mass index is 34.05 kg/m as calculated from the following:   Height as of this encounter: 5\' 6"  (1.676 m).   Weight as of this encounter: 95.7 kg.     Diet: Diet Order            Diet heart healthy/carb modified Room service appropriate? Yes; Fluid consistency: Thin; Fluid restriction: 1500 mL Fluid  Diet effective now                  Code Status: Full  code  Family Communication: Son-Kevin-[phone]-updated on the phone on 4/2  Disposition Plan: Status is: Inpatient  Remains inpatient appropriate because:Inpatient level of care appropriate due to severity of illness   Dispo: The patient is from: Home              Anticipated d/c is to: SNF              Patient currently is not medically stable to d/c.   Difficult to place patient No    Barriers to Discharge: Sepsis due to acute calculus cholecystitis-on IV Zosyn-now with significant volume overload-on IV diuretics-on IV heparin due to left lower extremity DVT.  Antimicrobial agents: Anti-infectives (From admission, onward)   Start     Dose/Rate Route Frequency Ordered Stop   03/26/21 2200  piperacillin-tazobactam (ZOSYN) IVPB 3.375 g        3.375 g 12.5 mL/hr over 240 Minutes Intravenous Every 12 hours 03/26/21 1137 03/27/21 0158   03/18/21 2200  piperacillin-tazobactam (ZOSYN) IVPB 3.375 g  Status:  Discontinued        3.375 g 12.5 mL/hr over 240 Minutes Intravenous Every 12 hours 03/18/21 1201 03/26/21 1137   03/17/21 1400  piperacillin-tazobactam (ZOSYN) IVPB 3.375 g  Status:  Discontinued        3.375 g 12.5 mL/hr over 240 Minutes Intravenous Every 8 hours 03/17/21 1246 03/18/21 1201       Time spent: 35- minutes-Greater than 50% of this time was spent in counseling, explanation of diagnosis, planning of further management, and coordination of care.  MEDICATIONS: Scheduled Meds: . Chlorhexidine Gluconate Cloth  6 each Topical Daily  . ezetimibe  10 mg Oral Daily  . furosemide  80 mg Intravenous Q12H  . insulin aspart  0-15 Units Subcutaneous  TID WC  . insulin aspart  0-5 Units Subcutaneous QHS  . insulin glargine  20 Units Subcutaneous Daily  . multivitamin with minerals  1 tablet Oral Daily  . [START ON 03/29/2021] potassium chloride  40 mEq Oral Daily  . sodium bicarbonate  1,300 mg Oral TID  . sodium chloride flush  3 mL Intravenous Q12H  . sodium chloride  flush  5 mL Intracatheter Q8H  . tamsulosin  0.4 mg Oral Daily   Continuous Infusions: . heparin 1,500 Units/hr (03/28/21 1223)   PRN Meds:.bisacodyl, dextrose, HYDROmorphone (DILAUDID) injection, ipratropium, levalbuterol, ondansetron **OR** ondansetron (ZOFRAN) IV, oxyCODONE, senna-docusate, traZODone   PHYSICAL EXAM: Vital signs: Vitals:   03/27/21 1941 03/27/21 2333 03/28/21 0349 03/28/21 0753  BP: 114/71 125/64 (!) 113/55 (!) 138/54  Pulse: 69 72 85 61  Resp: 18 18 18 16   Temp: 99.4 F (37.4 C) 97.9 F (36.6 C) 98 F (36.7 C) 99 F (37.2 C)  TempSrc: Oral Oral Oral Oral  SpO2: 97% 96% 95% 97%  Weight:      Height:       Filed Weights   03/25/21 0500 03/27/21 0442 03/27/21 0639  Weight: 97.7 kg 97.5 kg 95.7 kg   Body mass index is 34.05 kg/m.   Gen Exam:Alert awake-not in any distress HEENT:atraumatic, normocephalic Chest: B/L clear to auscultation anteriorly CVS:S1S2 regular Abdomen:soft non tender, non distended Extremities:++ edema Neurology: Non focal Skin: no rash  I have personally reviewed following labs and imaging studies  LABORATORY DATA: CBC: Recent Labs  Lab 03/22/21 0219 03/23/21 0231 03/24/21 0113 03/25/21 0217 03/27/21 0046  WBC 22.6* 23.5* 19.3* 16.9* 15.6*  HGB 6.2* 6.8* 6.4* 6.6* 7.1*  HCT 20.0* 22.0* 21.1* 21.0* 22.9*  MCV 93.0 95.7 97.2 96.8 97.0  PLT 266 270 298 319 419    Basic Metabolic Panel: Recent Labs  Lab 03/23/21 0231 03/24/21 0113 03/25/21 0217 03/26/21 0055 03/27/21 0046 03/28/21 0209  NA 135 132* 129* 133* 134* 134*  K 3.4* 3.7 3.8 3.6 3.5 3.1*  CL 107 104 101 103 101 100  CO2 17* 17* 17* 21* 20* 21*  GLUCOSE 159* 143* 190* 102* 116* 130*  BUN 79* 73* 70* 63* 62* 58*  CREATININE 3.25* 2.88* 3.23* 2.94* 2.98* 2.91*  CALCIUM 6.7* 6.9* 7.1* 7.3* 7.8* 7.9*  MG 2.4  --   --   --   --   --     GFR: Estimated Creatinine Clearance: 21.3 mL/min (A) (by C-G formula based on SCr of 2.91 mg/dL (H)).  Liver  Function Tests: Recent Labs  Lab 03/22/21 0219 03/23/21 0231 03/24/21 0113 03/25/21 0217 03/26/21 0055  AST 193* 106* 62* 34 27  ALT 652* 537* 370* 272* 200*  ALKPHOS 59 53 47 45 39  BILITOT 0.7 0.9 1.0 1.1 1.0  PROT 5.4* 5.6* 5.5* 5.6* 5.7*  ALBUMIN 2.0* 2.0* 1.9* 1.9* 1.8*   No results for input(s): LIPASE, AMYLASE in the last 168 hours. No results for input(s): AMMONIA in the last 168 hours.  Coagulation Profile: No results for input(s): INR, PROTIME in the last 168 hours.  Cardiac Enzymes: No results for input(s): CKTOTAL, CKMB, CKMBINDEX, TROPONINI in the last 168 hours.  BNP (last 3 results) No results for input(s): PROBNP in the last 8760 hours.  Lipid Profile: No results for input(s): CHOL, HDL, LDLCALC, TRIG, CHOLHDL, LDLDIRECT in the last 72 hours.  Thyroid Function Tests: No results for input(s): TSH, T4TOTAL, FREET4, T3FREE, THYROIDAB in the last 72 hours.  Anemia  Panel: No results for input(s): VITAMINB12, FOLATE, FERRITIN, TIBC, IRON, RETICCTPCT in the last 72 hours.  Urine analysis:    Component Value Date/Time   COLORURINE AMBER (A) 03/17/2021 1208   APPEARANCEUR CLOUDY (A) 03/17/2021 1208   LABSPEC 1.015 03/17/2021 1208   PHURINE 5.0 03/17/2021 1208   GLUCOSEU 50 (A) 03/17/2021 1208   HGBUR SMALL (A) 03/17/2021 1208   BILIRUBINUR NEGATIVE 03/17/2021 [place] 03/17/2021 1208   PROTEINUR >=300 (A) 03/17/2021 1208   UROBILINOGEN 0.2 01/17/2013 1910   NITRITE NEGATIVE 03/17/2021 1208   LEUKOCYTESUR LARGE (A) 03/17/2021 1208    Sepsis Labs: Lactic Acid, Venous    Component Value Date/Time   LATICACIDVEN 1.4 03/21/2021 0805    MICROBIOLOGY: Recent Results (from the past 240 hour(s))  Aerobic/Anaerobic Culture (surgical/deep wound)     Status: None   Collection Time: 03/19/21  2:51 PM   Specimen: Abdomen; Abdominal Fluid  Result Value Ref Range Status   Specimen Description ABDOMEN  Final   Special Requests GALLBLADDER   Final   Gram Stain NO WBC SEEN NO ORGANISMS SEEN   Final   Culture   Final    No growth aerobically or anaerobically. Performed at [facility], [place] [address], [place], [place] [address]    Report Status 03/24/2021 FINAL  Final    RADIOLOGY STUDIES/RESULTS: DG Chest Port 1 View  Result Date: 03/27/2021 CLINICAL DATA:  Shortness of breath EXAM: PORTABLE CHEST 1 VIEW COMPARISON:  03/20/2021 FINDINGS: Left pacer remains in place, unchanged. Cardiomegaly. Small bilateral pleural effusions with bibasilar atelectasis. No overt edema. No acute bony abnormality. IMPRESSION: Small effusions with bibasilar atelectasis. Electronically Signed   By: Rolm Baptise M.D.   On: 03/27/2021 19:05   VAS [place] LOWER EXTREMITY VENOUS (DVT)  Result Date: 03/28/2021  Lower Venous DVT Study Indications: Swelling, history of CHF.  Limitations: Poor ultrasound/tissue interface and body habitus. Comparison Study: No prior studies. Performing Technologist: Darlin Coco RDMS,RVT  Examination Guidelines: A complete evaluation includes B-mode imaging, spectral Doppler, color Doppler, and power Doppler as needed of all accessible portions of each vessel. Bilateral testing is considered an integral part of a complete examination. Limited examinations for reoccurring indications may be performed as noted. The reflux portion of the exam is performed with the patient in reverse Trendelenburg.  +---------+---------------+---------+-----------+----------+-------------------+ RIGHT    CompressibilityPhasicitySpontaneityPropertiesThrombus Aging      +---------+---------------+---------+-----------+----------+-------------------+ CFV      Full           Yes      Yes                                      +---------+---------------+---------+-----------+----------+-------------------+ SFJ      Full                                                              +---------+---------------+---------+-----------+----------+-------------------+ FV Prox  Full                                                             +---------+---------------+---------+-----------+----------+-------------------+  FV Mid   Full                                                             +---------+---------------+---------+-----------+----------+-------------------+ FV DistalFull                                                             +---------+---------------+---------+-----------+----------+-------------------+ PFV      Full                                                             +---------+---------------+---------+-----------+----------+-------------------+ POP      Full           Yes      Yes                                      +---------+---------------+---------+-----------+----------+-------------------+ PTV      Full                                         Some segments not                                                         well visualized     +---------+---------------+---------+-----------+----------+-------------------+ PERO     Full                                         Some segments not                                                         well visualized     +---------+---------------+---------+-----------+----------+-------------------+   +---------+---------------+---------+-----------+----------+-------------------+ LEFT     CompressibilityPhasicitySpontaneityPropertiesThrombus Aging      +---------+---------------+---------+-----------+----------+-------------------+ CFV      Full           Yes      Yes                                      +---------+---------------+---------+-----------+----------+-------------------+ SFJ      Full                                                             +---------+---------------+---------+-----------+----------+-------------------+  FV  Prox  Full                                                             +---------+---------------+---------+-----------+----------+-------------------+ FV Mid   Full                                                             +---------+---------------+---------+-----------+----------+-------------------+ FV DistalFull                                                             +---------+---------------+---------+-----------+----------+-------------------+ PFV      Full                                                             +---------+---------------+---------+-----------+----------+-------------------+ POP      Partial        Yes      Yes                  Age Indeterminate   +---------+---------------+---------+-----------+----------+-------------------+ PTV      Full                                                             +---------+---------------+---------+-----------+----------+-------------------+ PERO     Full                                         Some segments not                                                         well visualized     +---------+---------------+---------+-----------+----------+-------------------+     Summary: RIGHT: - There is no evidence of deep vein thrombosis in the lower extremity. However, portions of this examination were limited- see technologist comments above.  - No cystic structure found in the popliteal fossa.  LEFT: - Findings consistent with age indeterminate deep vein thrombosis involving the left popliteal vein. - No cystic structure found in the popliteal fossa. - Ultrasound characteristics of enlarged lymph nodes noted in the groin.  *See table(s) above for measurements and observations. Electronically signed by Monica Martinez MD on 03/28/2021 at 11:14:58 AM.    Final      LOS: 12 days   Oren Binet, MD  [facility]    To contact the attending provider between 7A-7P or the covering  provider during after hours 7P-7A, please log into the web site [email or web address] and access using universal [place] password for that web site. If you do not have the password, please call the hospital operator.  03/28/2021, 1:47 PM

## 2021-03-28 NOTE — Progress Notes (Signed)
ANTICOAGULATION CONSULT NOTE - Initial Consult  Pharmacy Consult for Heparin Indication: DVT  No Known Allergies  Patient Measurements: Height: 5\' 6"  (167.6 cm) Weight: 95.7 kg (210 lb 15.7 oz) IBW/kg (Calculated) : 59.3  Vital Signs: Temp: 98.1 F (36.7 C) (04/02 2025) Temp Source: Axillary (04/02 2025) BP: 117/48 (04/02 2025) Pulse Rate: 68 (04/02 2025)  Labs: Recent Labs    03/26/21 0055 03/27/21 0046 03/28/21 0209 03/28/21 2028  HGB  --  7.1*  --   --   HCT  --  22.9*  --   --   PLT  --  360  --   --   HEPARINUNFRC  --   --   --  0.43  CREATININE 2.94* 2.98* 2.91*  --     Estimated Creatinine Clearance: 21.3 mL/min (A) (by C-G formula based on SCr of 2.91 mg/dL (H)).   Medical History: Past Medical History:  Diagnosis Date  . Arthritis   . Cardiomyopathy ([place])   . Diabetes mellitus without complication ([place])   . Hypertension   . Stroke [facility])     Assessment: 70 y.o. female being evaluated for acute calculus cholecystitis. Hospital course complicated by AKI, multifactorial anemia, volume overload and now a new diagnosis of DVT. Pharmacy consulted to start heparin therapy and asked to maintain at the low end of the range due to ongoing anemia and that patient is a [employer]hov[employer]'s witness.  Initial heparin level at goal 0.43 on 1500 units/hr. No bleeding or infusion issues noted.   Goal of Therapy:  Heparin level 0.3-0.5 units/ml Monitor platelets by anticoagulation protocol: Yes   Plan:  Continue heparin infusion at 1500 units/hr  Check anti-Xa level daily while on heparin Continue to monitor H&H and platelets  Erin Hearing PharmD., BCPS Clinical Pharmacist 03/28/2021 10:04 PM

## 2021-03-28 NOTE — Progress Notes (Signed)
ANTICOAGULATION CONSULT NOTE - Initial Consult  Pharmacy Consult for Heparin Indication: DVT  No Known Allergies  Patient Measurements: Height: 5\' 6"  (167.6 cm) Weight: 95.7 kg (210 lb 15.7 oz) IBW/kg (Calculated) : 59.3  Vital Signs: Temp: 99 F (37.2 C) (04/02 0753) Temp Source: Oral (04/02 0753) BP: 138/54 (04/02 0753) Pulse Rate: 61 (04/02 0753)  Labs: Recent Labs    03/26/21 0055 03/27/21 0046 03/28/21 0209  HGB  --  7.1*  --   HCT  --  22.9*  --   PLT  --  360  --   CREATININE 2.94* 2.98* 2.91*    Estimated Creatinine Clearance: 21.3 mL/min (A) (by C-G formula based on SCr of 2.91 mg/dL (H)).   Medical History: Past Medical History:  Diagnosis Date  . Arthritis   . Cardiomyopathy ([place])   . Diabetes mellitus without complication ([place])   . Hypertension   . Stroke [facility])     Assessment: 70 y.o. female being evaluated for acute calculus cholecystitis. Hospital course complicated by AKI, multifactorial anemia, volume overload and now a new diagnosis of DVT. Pharmacy consulted to start heparin therapy and asked to maintain at the low end of the range due to ongoing anemia and that patient is a Jehovah's witness.  Goal of Therapy:  Heparin level 0.3-0.5 units/ml Monitor platelets by anticoagulation protocol: Yes   Plan:  Start heparin infusion at 1500 units/hr (no bolus because of anemia and desire to be a the low end of the range) Check anti-Xa level in 8 hours and daily while on heparin Continue to monitor H&H and platelets  Alanda Slim, PharmD, [facility] Clinical Pharmacist Please see AMION for all Pharmacists' Contact Phone Numbers 03/28/2021, 11:27 AM

## 2021-03-28 NOTE — Progress Notes (Signed)
Lower extremity venous bilateral study completed.  Preliminary results relayed to Ghimire, MD.   See CV Proc for preliminary results report.   Eirik Schueler, RDMS, RVT  

## 2021-03-29 DIAGNOSIS — I5021 Acute systolic (congestive) heart failure: Secondary | ICD-10-CM | POA: Diagnosis not present

## 2021-03-29 DIAGNOSIS — E0810 Diabetes mellitus due to underlying condition with ketoacidosis without coma: Secondary | ICD-10-CM | POA: Diagnosis not present

## 2021-03-29 DIAGNOSIS — K810 Acute cholecystitis: Secondary | ICD-10-CM | POA: Diagnosis not present

## 2021-03-29 DIAGNOSIS — N390 Urinary tract infection, site not specified: Secondary | ICD-10-CM | POA: Diagnosis not present

## 2021-03-29 LAB — CBC
HCT: 23.7 % — ABNORMAL LOW (ref 36.0–46.0)
Hemoglobin: 7.2 g/dL — ABNORMAL LOW (ref 12.0–15.0)
MCH: 29.9 pg (ref 26.0–34.0)
MCHC: 30.4 g/dL (ref 30.0–36.0)
MCV: 98.3 fL (ref 80.0–100.0)
Platelets: 348 10*3/uL (ref 150–400)
RBC: 2.41 MIL/uL — ABNORMAL LOW (ref 3.87–5.11)
RDW: 21 % — ABNORMAL HIGH (ref 11.5–15.5)
WBC: 12.3 10*3/uL — ABNORMAL HIGH (ref 4.0–10.5)
nRBC: 0 % (ref 0.0–0.2)

## 2021-03-29 LAB — BASIC METABOLIC PANEL
Anion gap: 9 (ref 5–15)
BUN: 55 mg/dL — ABNORMAL HIGH (ref 8–23)
CO2: 27 mmol/L (ref 22–32)
Calcium: 8.2 mg/dL — ABNORMAL LOW (ref 8.9–10.3)
Chloride: 102 mmol/L (ref 98–111)
Creatinine, Ser: 2.78 mg/dL — ABNORMAL HIGH (ref 0.44–1.00)
GFR, Estimated: 18 mL/min — ABNORMAL LOW (ref >60)
Glucose, Bld: 173 mg/dL — ABNORMAL HIGH (ref 70–99)
Potassium: 3.8 mmol/L (ref 3.5–5.1)
Sodium: 138 mmol/L (ref 135–145)

## 2021-03-29 LAB — GLUCOSE, CAPILLARY
Glucose-Capillary: 161 mg/dL — ABNORMAL HIGH (ref 70–99)
Glucose-Capillary: 162 mg/dL — ABNORMAL HIGH (ref 70–99)
Glucose-Capillary: 172 mg/dL — ABNORMAL HIGH (ref 70–99)
Glucose-Capillary: 166 mg/dL — ABNORMAL HIGH (ref 70–99)

## 2021-03-29 LAB — HEPARIN LEVEL (UNFRACTIONATED): Heparin Unfractionated: 0.36 IU/mL (ref 0.30–0.70)

## 2021-03-29 LAB — MAGNESIUM: Magnesium: 2.6 mg/dL — ABNORMAL HIGH (ref 1.7–2.4)

## 2021-03-29 MED ORDER — METOLAZONE 5 MG PO TABS
5.0000 mg | ORAL_TABLET | Freq: Once | ORAL | Status: AC
  Administered 2021-03-29: 5 mg via ORAL
  Filled 2021-03-29: qty 1

## 2021-03-29 NOTE — Progress Notes (Signed)
ANTICOAGULATION CONSULT NOTE - Initial Consult  Pharmacy Consult for Heparin Indication: DVT  No Known Allergies  Patient Measurements: Height: 5\' 6"  (167.6 cm) Weight: 93.8 kg (206 lb 12.7 oz) IBW/kg (Calculated) : 59.3  Vital Signs: Temp: 98.1 F (36.7 C) (04/03 0420) Temp Source: Axillary (04/03 0420) BP: 122/61 (04/03 0420) Pulse Rate: 62 (04/03 0420)  Labs: Recent Labs    03/27/21 0046 03/28/21 0209 03/28/21 2028 03/29/21 0208  HGB 7.1*  --   --  7.2*  HCT 22.9*  --   --  23.7*  PLT 360  --   --  348  HEPARINUNFRC  --   --  0.43 0.36  CREATININE 2.98* 2.91*  --  2.78*    Estimated Creatinine Clearance: 22 mL/min (A) (by C-G formula based on SCr of 2.78 mg/dL (H)).   Medical History: Past Medical History:  Diagnosis Date  . Arthritis   . Cardiomyopathy ([place])   . Diabetes mellitus without complication ([place])   . Hypertension   . Stroke [facility])     Assessment: 70 y.o. female being evaluated for acute calculus cholecystitis. Hospital course complicated by AKI, multifactorial anemia, volume overload and now a new diagnosis of DVT. Pharmacy consulted to start heparin therapy and asked to maintain at the low end of the range due to ongoing anemia and that patient is a Jehovah's witness.  Initial heparin level at goal 0.36 on 1500 units/hr. No bleeding or infusion issues noted.   Goal of Therapy:  Heparin level 0.3-0.5 units/ml Monitor platelets by anticoagulation protocol: Yes   Plan:  Continue heparin infusion at 1500 units/hr  Check anti-Xa level daily while on heparin Continue to monitor H&H and platelets  Alanda Slim, PharmD, [facility] Clinical Pharmacist Please see AMION for all Pharmacists' Contact Phone Numbers 03/29/2021, 7:51 AM

## 2021-03-29 NOTE — Progress Notes (Signed)
Physical Therapy Treatment Patient Details Name: Tracey Bryant MRN: [id number] DOB: 1951-01-22 Today's Date: 03/29/2021    History of Present Illness 70 y.o. female with presented to [facility] on 3/21  with nausea/vomiting/abdominal pain-further evaluation revealed acute calculus cholecystitis. Transferred to [facility] 3/24 for percutaneous gallbladder drain placement  Hospital course complicated by AKI, multifactorial anemia, and volume overload PMH: past medical history of DM-2, HTN, A. fib, chronic systolic heart failure, s/p permanent pacemaker placement    PT Comments    Continuing work on functional mobility and activity tolerance;  Syncopal episode, likely related to BP drop with upright sitting,  hampered pt's ability to get OOB to the [facility] or recliner today; Will consider ACE wrapping LEs prior to getting up next session, and of course will monitor BP and sympotms;   Recommend putting bed in chair position numerous times throughout the day (especially with meals);   Once back in bed, assessed pt's BPs with HOB at varying degrees towards upright as follows:    03/29/21 1230 03/29/21 1231 03/29/21 1232  Orthostatic Sitting  BP- Sitting 123/62 (HOB 30 degrees) 125/75 (HOB 52 degrees) 131/70 (HOB 64 degrees)  Pulse- Sitting 64 63 68    Follow Up Recommendations  SNF     Equipment Recommendations  None recommended by PT    Recommendations for Other Services       Precautions / Restrictions Precautions Precautions: Fall Precaution Comments: monitor BP, hemoglobin    Mobility  Bed Mobility Overal bed mobility: Needs Assistance Bed Mobility: Supine to Sit;Sit to Supine     Supine to sit: Min assist;HOB elevated Sit to supine: Total assist;+2 for physical assistance   General bed mobility comments: Min assist and [place] of rails to pull up to sit at EOB; Sat EOB long enough to obtain a BP 102/79 (representing 19 mmHg SBP drop), and then had an episode of unresponsivenes  while PT and nursing were preparing lines, leads, and O2 for getting up to [facility]; Total assist to help back to bed supine, where it took 1 minute for pt to regain responsiveness, and about 2 minutes for pt to be able to get back to conversation    Transfers                 General transfer comment: unable to attempt  Ambulation/Gait                 Stairs             Wheelchair Mobility    Modified Rankin (Stroke Patients Only)       Balance     Sitting balance-Leahy Scale: Fair Sitting balance - Comments: After getting back to bed because of syncope, postiotned pt for sitting up trials                                    Cognition Arousal/Alertness: Awake/alert Behavior During Therapy: WFL for tasks assessed/performed Overall Cognitive Status: Within Functional Limits for tasks assessed                                 General Comments: Able to identify that her head and body felt "wobbly" before her episode of unresponsiveness      Exercises      General Comments        Pertinent Vitals/Pain Pain  Assessment: No/denies pain Faces Pain Scale: No hurt Pain Intervention(s): Monitored during session    Home Living                      Prior Function            PT Goals (current goals can now be found in the care plan section) Acute Rehab PT Goals Patient Stated Goal: goal this session was to get to teh commode to urinate PT Goal Formulation: With patient Time For Goal Achievement: 04/09/21 Potential to Achieve Goals: Good Progress towards PT goals: Progressing toward goals (very slowly due to episode of syncope)    Frequency    Min 3X/week      PT Plan Discharge plan needs to be updated    Co-evaluation              AM-PAC PT "6 Clicks" Mobility   Outcome Measure  Help needed turning from your back to your side while in a flat bed without using bedrails?: A Little Help needed moving  from lying on your back to sitting on the side of a flat bed without using bedrails?: A Little Help needed moving to and from a bed to a chair (including a wheelchair)?: A Lot Help needed standing up from a chair using your arms (e.g., wheelchair or bedside chair)?: A Lot Help needed to walk in hospital room?: A Lot Help needed climbing 3-5 steps with a railing? : Total 6 Click Score: 13    End of Session   Activity Tolerance: Treatment limited secondary to medical complications (Comment) Patient left: in bed;with call bell/phone within reach;with bed alarm set;Other (comment) (bed to chair position with HOB at 64 degrees)   PT Visit Diagnosis: Unsteadiness on feet (R26.81);Other abnormalities of gait and mobility (R26.89);Muscle weakness (generalized) (M62.81)     Time: 6568-1275 PT Time Calculation (min) (ACUTE ONLY): 31 min  Charges:  $Therapeutic Activity: 23-37 mins                     Roney Marion, PT  Acute Rehabilitation Services Pager [phone] Office [place] 03/29/2021, 3:25 PM

## 2021-03-29 NOTE — Progress Notes (Signed)
PROGRESS NOTE        PATIENT DETAILS Name: Tracey Bryant Age: 70 y.o. Sex: female Date of Birth: February 25, 1951 Admit Date: 03/16/2021 Admitting Physician Deatra James, MD [id number], Arlyn Leak, FNP  Brief Narrative: Patient is a 70 y.o. female with past medical history of DM-2, HTN, A. fib, chronic systolic heart failure, s/p permanent pacemaker placement-presented to the ED on 3/21  with nausea/vomiting/abdominal pain-further evaluation revealed acute calculus cholecystitis.  Hospital course complicated by AKI, multifactorial anemia, volume overload, age indeterminate left DVT.  See below for further details  Significant events: 3/20>> ED visit for epistaxis 3/21>> admit to [facility] for acute calculus cholecystitis 3/24>> transfer to [facility] for percutaneous gallbladder drain placement  Significant studies: 3/22 >> CT abdomen/pelvis: Cholelithiasis, bilateral small pleural effusions, ascites with anasarca 3/23>> RUQ ultrasound: Acute calculus cholecystitis 3/23>> Echo: EF 25-30%, decreased RV systolic function 0/93>> mild central pulmonary vascular congestion. 4/2>> lower extremity Doppler: Age indeterminate DVT left popliteal vein.  Antimicrobial therapy: Zosyn: 3/22>>3/31  Microbiology data: 3/22>> urine culture: E. Coli 3/22>> blood culture: No growth 3/24>> gallbladder/bile culture: No growth  Procedures : 3/24>> CT-guided cholecystostomy tube placement  Consults: IR, general surgery, nephrology, gastroenterology  DVT Prophylaxis : Place and maintain sequential compression device Start: 03/21/21 1323 Place and maintain sequential compression device Start: 03/20/21 1411 TED hose Start: 03/16/21 1441 SCDs Start: 03/16/21 1441  IV heparin on 4/12   Subjective: No major events overnight-acknowledges that she feels that she has less "water" in her body.  Feels fatigued and tired with minimal activity.  Assessment/Plan: Severe sepsis due to acute  calculus cholecystitis and UTI: Sepsis physiology has resolved-culture data as above- completed 10 days of Zosyn on 3/31.   Acute calculus cholecystitis:evaluated by general surgery at [facility]-not a surgical candidate-hence percutaneous cholecystostomy tube placed.  IR following.  Transaminitis: Multifactorial-probably due to sepsis/acute cholecystitis-downtrending-continue to follow periodically.  Acute on chronic systolic/diastolic heart failure: Although volume status has improved-weight downtrending-still remains volume overloaded-continue IV Lasix-repeat 1 dose of metolazone.  She continues to feel fatigued-and has exertional dyspnea-some component of this is due to symptomatic anemia as well.  Continue to follow weights/electrolytes/intake/output.    AKI on CKD stage IIIa: AKI likely hemodynamically mediated-slowly improving-creatinine continues to downtrend-remains volume overloaded-on diuretics-see above.   Acute urinary retention: Foley catheter placed on 3/26-started Flomax on 3/30-successful voiding trial on 4/2-no longer with Foley catheter.  Will stop Flomax.  Hyponatremia: Due to hypervolemia-resolved with diuretics.  Anemia: Multifactorial-some component of acute blood loss due to epistaxis that occurred 1 day prior to this hospitalization-on top of anemia due to chronic disease/critical illness.  Also FOBT positive-no overt GI blood loss.  Is a [name] continues to refuse blood transfusion-she confirms that she will refuse blood-even in the face of life-threatening situation.  Continues to have symptoms from anemia with fatigue and exertional dyspnea.  Completed IV iron-remains on weekly Aranesp.  Thankfully CBC has stabilized somewhat and is slowly uptrending.  .  Left lower leg DVT: Age indeterminate-but suspect must have occurred this admission as she is maintained on Eliquis-unfortunately Eliquis was discontinued as patient had epistaxis-and given the severity of anemia.   After extensive discussion with patient/son-regarding risk/benefits-IV heparin was started on 4/2.  No evidence of any bleeding-CBC stable.  Continue to monitor closely.  equire IVC.  Have asked patient to let us know if  she reconsiders and is agreeable to a blood transfusion.  PAF: Appears to be in sinus rhythm-Eliquis initially on hold due to severity of anemia and recent history of epistaxis-however now due to diagnosis of DVT-on IV heparin.  History of CVA: Nonfocal exam-difficult situation-Eliquis on hold-but on IV heparin.  HLD: On Zetia  DM-2: CBG stable-continue Lantus 20 units daily and SSI.  Recent Labs    03/28/21 1718 03/28/21 2017 03/29/21 0752  GLUCAP 171* 194* 161*    Obesity: Estimated body mass index is 33.38 kg/m as calculated from the following:   Height as of this encounter: 5\' 6"  (1.676 m).   Weight as of this encounter: 93.8 kg.   Debility/deconditioning: Due to acute illness-PT/OT recommending CIR-however probably will require SNF per notes from CIR.  Diet: Diet Order            Diet heart healthy/carb modified Room service appropriate? Yes; Fluid consistency: Thin; Fluid restriction: 1500 mL Fluid  Diet effective now                  Code Status: Full code  Family Communication: Son-[phone]-called on 4/3-unable to leave voicemail.  Disposition Plan: Status is: Inpatient  Remains inpatient appropriate because:Inpatient level of care appropriate due to severity of illness   Dispo: The patient is from: Home              Anticipated d/c is to: SNF              Patient currently is not medically stable to d/c.   Difficult to place patient No    Barriers to Discharge: Sepsis due to acute calculus cholecystitis-with biliary drain in place-remains significantly volume overloaded on IV diuretics-severe anemia-DVT on IV heparin.  Needs continued inpatient monitoring for above therapies.  Antimicrobial agents: Anti-infectives (From  admission, onward)   Start     Dose/Rate Route Frequency Ordered Stop   03/26/21 2200  piperacillin-tazobactam (ZOSYN) IVPB 3.375 g        3.375 g 12.5 mL/hr over 240 Minutes Intravenous Every 12 hours 03/26/21 1137 03/27/21 0158   03/18/21 2200  piperacillin-tazobactam (ZOSYN) IVPB 3.375 g  Status:  Discontinued        3.375 g 12.5 mL/hr over 240 Minutes Intravenous Every 12 hours 03/18/21 1201 03/26/21 1137   03/17/21 1400  piperacillin-tazobactam (ZOSYN) IVPB 3.375 g  Status:  Discontinued        3.375 g 12.5 mL/hr over 240 Minutes Intravenous Every 8 hours 03/17/21 1246 03/18/21 1201       Time spent: 35- minutes-Greater than 50% of this time was spent in counseling, explanation of diagnosis, planning of further management, and coordination of care.  MEDICATIONS: Scheduled Meds: . Chlorhexidine Gluconate Cloth  6 each Topical Daily  . darbepoetin (ARANESP) injection - NON-DIALYSIS  60 mcg Subcutaneous Q Sat-1800  . ezetimibe  10 mg Oral Daily  . furosemide  80 mg Intravenous Q12H  . insulin aspart  0-15 Units Subcutaneous TID WC  . insulin aspart  0-5 Units Subcutaneous QHS  . insulin glargine  20 Units Subcutaneous Daily  . multivitamin with minerals  1 tablet Oral Daily  . potassium chloride  40 mEq Oral Daily  . sodium bicarbonate  1,300 mg Oral TID  . sodium chloride flush  3 mL Intravenous Q12H  . sodium chloride flush  5 mL Intracatheter Q8H   Continuous Infusions: . heparin 1,500 Units/hr (03/29/21 0600)   PRN Meds:.bisacodyl, dextrose, HYDROmorphone (DILAUDID) injection, ipratropium, levalbuterol, ondansetron **  OR** ondansetron (ZOFRAN) IV, oxyCODONE, senna-docusate, traZODone   PHYSICAL EXAM: Vital signs: Vitals:   03/29/21 0420 03/29/21 0500 03/29/21 0753 03/29/21 0936  BP: 122/61  99/65 (!) 119/56  Pulse: 62  62 61  Resp: 19  16   Temp: 98.1 F (36.7 C)  98 F (36.7 C)   TempSrc: Axillary  Oral   SpO2: 94%  98% 97%  Weight:  93.8 kg    Height:        Filed Weights   03/27/21 0442 03/27/21 0639 03/29/21 0500  Weight: 97.5 kg 95.7 kg 93.8 kg   Body mass index is 33.38 kg/m.   Gen Exam:Alert awake-not in any distress HEENT:atraumatic, normocephalic Chest: B/L clear to auscultation anteriorly CVS:S1S2 regular Abdomen:soft non tender, non distended Extremities:++ edema Neurology: Non focal Skin: no rash  I have personally reviewed following labs and imaging studies  LABORATORY DATA: CBC: Recent Labs  Lab 03/23/21 0231 03/24/21 0113 03/25/21 0217 03/27/21 0046 03/29/21 0208  WBC 23.5* 19.3* 16.9* 15.6* 12.3*  HGB 6.8* 6.4* 6.6* 7.1* 7.2*  HCT 22.0* 21.1* 21.0* 22.9* 23.7*  MCV 95.7 97.2 96.8 97.0 98.3  PLT 270 298 319 360 097    Basic Metabolic Panel: Recent Labs  Lab 03/23/21 0231 03/24/21 0113 03/25/21 0217 03/26/21 0055 03/27/21 0046 03/28/21 0209 03/29/21 0208  NA 135   < > 129* 133* 134* 134* 138  K 3.4*   < > 3.8 3.6 3.5 3.1* 3.8  CL 107   < > 101 103 101 100 102  CO2 17*   < > 17* 21* 20* 21* 27  GLUCOSE 159*   < > 190* 102* 116* 130* 173*  BUN 79*   < > 70* 63* 62* 58* 55*  CREATININE 3.25*   < > 3.23* 2.94* 2.98* 2.91* 2.78*  CALCIUM 6.7*   < > 7.1* 7.3* 7.8* 7.9* 8.2*  MG 2.4  --   --   --   --   --  2.6*   < > = values in this interval not displayed.    GFR: Estimated Creatinine Clearance: 22 mL/min (A) (by C-G formula based on SCr of 2.78 mg/dL (H)).  Liver Function Tests: Recent Labs  Lab 03/23/21 0231 03/24/21 0113 03/25/21 0217 03/26/21 0055  AST 106* 62* 34 27  ALT 537* 370* 272* 200*  ALKPHOS 53 47 45 39  BILITOT 0.9 1.0 1.1 1.0  PROT 5.6* 5.5* 5.6* 5.7*  ALBUMIN 2.0* 1.9* 1.9* 1.8*   No results for input(s): LIPASE, AMYLASE in the last 168 hours. No results for input(s): AMMONIA in the last 168 hours.  Coagulation Profile: No results for input(s): INR, PROTIME in the last 168 hours.  Cardiac Enzymes: No results for input(s): CKTOTAL, CKMB, CKMBINDEX, TROPONINI in the  last 168 hours.  BNP (last 3 results) No results for input(s): PROBNP in the last 8760 hours.  Lipid Profile: No results for input(s): CHOL, HDL, LDLCALC, TRIG, CHOLHDL, LDLDIRECT in the last 72 hours.  Thyroid Function Tests: No results for input(s): TSH, T4TOTAL, FREET4, T3FREE, THYROIDAB in the last 72 hours.  Anemia Panel: No results for input(s): VITAMINB12, FOLATE, FERRITIN, TIBC, IRON, RETICCTPCT in the last 72 hours.  Urine analysis:    Component Value Date/Time   COLORURINE AMBER (A) 03/17/2021 1208   APPEARANCEUR CLOUDY (A) 03/17/2021 1208   LABSPEC 1.015 03/17/2021 1208   PHURINE 5.0 03/17/2021 1208   GLUCOSEU 50 (A) 03/17/2021 1208   HGBUR SMALL (A) 03/17/2021 1208   BILIRUBINUR  NEGATIVE 03/17/2021 [place] 03/17/2021 1208   PROTEINUR >=300 (A) 03/17/2021 1208   UROBILINOGEN 0.2 01/17/2013 1910   NITRITE NEGATIVE 03/17/2021 1208   LEUKOCYTESUR LARGE (A) 03/17/2021 1208    Sepsis Labs: Lactic Acid, Venous    Component Value Date/Time   LATICACIDVEN 1.4 03/21/2021 0805    MICROBIOLOGY: Recent Results (from the past 240 hour(s))  Aerobic/Anaerobic Culture (surgical/deep wound)     Status: None   Collection Time: 03/19/21  2:51 PM   Specimen: Abdomen; Abdominal Fluid  Result Value Ref Range Status   Specimen Description ABDOMEN  Final   Special Requests GALLBLADDER  Final   Gram Stain NO WBC SEEN NO ORGANISMS SEEN   Final   Culture   Final    No growth aerobically or anaerobically. Performed at [facility], [place] [address] [place], [place] [address]    Report Status 03/24/2021 FINAL  Final    RADIOLOGY STUDIES/RESULTS: DG Chest Port 1 View  Result Date: 03/27/2021 CLINICAL DATA:  Shortness of breath EXAM: PORTABLE CHEST 1 VIEW COMPARISON:  03/20/2021 FINDINGS: Left pacer remains in place, unchanged. Cardiomegaly. Small bilateral pleural effusions with bibasilar atelectasis. No overt edema. No acute bony abnormality. IMPRESSION:  Small effusions with bibasilar atelectasis. Electronically Signed   By: Rolm Baptise M.D.   On: 03/27/2021 19:05   VAS [place] LOWER EXTREMITY VENOUS (DVT)  Result Date: 03/28/2021  Lower Venous DVT Study Indications: Swelling, history of CHF.  Limitations: Poor ultrasound/tissue interface and body habitus. Comparison Study: No prior studies. Performing Technologist: Darlin Coco RDMS,RVT  Examination Guidelines: A complete evaluation includes B-mode imaging, spectral Doppler, color Doppler, and power Doppler as needed of all accessible portions of each vessel. Bilateral testing is considered an integral part of a complete examination. Limited examinations for reoccurring indications may be performed as noted. The reflux portion of the exam is performed with the patient in reverse Trendelenburg.  +---------+---------------+---------+-----------+----------+-------------------+ RIGHT    CompressibilityPhasicitySpontaneityPropertiesThrombus Aging      +---------+---------------+---------+-----------+----------+-------------------+ CFV      Full           Yes      Yes                                      +---------+---------------+---------+-----------+----------+-------------------+ SFJ      Full                                                             +---------+---------------+---------+-----------+----------+-------------------+ FV Prox  Full                                                             +---------+---------------+---------+-----------+----------+-------------------+ FV Mid   Full                                                             +---------+---------------+---------+-----------+----------+-------------------+  FV DistalFull                                                             +---------+---------------+---------+-----------+----------+-------------------+ PFV      Full                                                              +---------+---------------+---------+-----------+----------+-------------------+ POP      Full           Yes      Yes                                      +---------+---------------+---------+-----------+----------+-------------------+ PTV      Full                                         Some segments not                                                         well visualized     +---------+---------------+---------+-----------+----------+-------------------+ PERO     Full                                         Some segments not                                                         well visualized     +---------+---------------+---------+-----------+----------+-------------------+   +---------+---------------+---------+-----------+----------+-------------------+ LEFT     CompressibilityPhasicitySpontaneityPropertiesThrombus Aging      +---------+---------------+---------+-----------+----------+-------------------+ CFV      Full           Yes      Yes                                      +---------+---------------+---------+-----------+----------+-------------------+ SFJ      Full                                                             +---------+---------------+---------+-----------+----------+-------------------+ FV Prox  Full                                                             +---------+---------------+---------+-----------+----------+-------------------+  FV Mid   Full                                                             +---------+---------------+---------+-----------+----------+-------------------+ FV DistalFull                                                             +---------+---------------+---------+-----------+----------+-------------------+ PFV      Full                                                             +---------+---------------+---------+-----------+----------+-------------------+  POP      Partial        Yes      Yes                  Age Indeterminate   +---------+---------------+---------+-----------+----------+-------------------+ PTV      Full                                                             +---------+---------------+---------+-----------+----------+-------------------+ PERO     Full                                         Some segments not                                                         well visualized     +---------+---------------+---------+-----------+----------+-------------------+     Summary: RIGHT: - There is no evidence of deep vein thrombosis in the lower extremity. However, portions of this examination were limited- see technologist comments above.  - No cystic structure found in the popliteal fossa.  LEFT: - Findings consistent with age indeterminate deep vein thrombosis involving the left popliteal vein. - No cystic structure found in the popliteal fossa. - Ultrasound characteristics of enlarged lymph nodes noted in the groin.  *See table(s) above for measurements and observations. Electronically signed by Monica Martinez MD on 03/28/2021 at 11:14:58 AM.    Final      LOS: 13 days   Oren Binet, MD  [facility]    To contact the attending provider between 7A-7P or the covering provider during after hours 7P-7A, please log into the web site [email or web address] and access using universal [place] password for that web site. If you do not have the password, please call the hospital operator.  03/29/2021, 10:51 AM

## 2021-03-29 NOTE — TOC Progression Note (Signed)
Transition of Care [facility]) - Progression Note    Patient Details  Name: Tracey Bryant MRN: [id number] Date of Birth: 17-Jul-1951  Transition of Care [facility]) CM/SW [place], [place] Phone Number: 03/29/2021, 10:22 AM  Clinical Narrative:    CSW reached out to son in reference to options for SNF placement since CIR signed off. Son feels as though pt is not ready to dc, states he would feel more comfortable with pt being here in the hospital with MDs. Son states he has had conversations with MDs about pt's current status and states he has been told that if pt falls she could die, which causes stress for son to think of sending her outside of the hospital. CSW spoke with pt's son about CIR vs SNF and how pt would need to progress at a faster pace in order to qualify for CIR, pt's son states that he thought PT wasn't being considered because of how weak and sick pt is. Due to pt's son's worries, CSW didn't push the option of SNF, instead offered to have MD give him a call, pt's son appreciative of this. CSW also adv that another CSW would more than likely follow up with pt tomorrow.    Expected Discharge Plan: [place] Barriers to Discharge: Continued Medical Work up  Expected Discharge Plan and Services Expected Discharge Plan: [place] In-house Referral: Clinical Social Work     Living arrangements for the past 2 months: Single Family Home                                       Social Determinants of Health (SDOH) Interventions    Readmission Risk Interventions Readmission Risk Prevention Plan 03/19/2021 03/17/2021  Transportation Screening - Complete  PCP or Specialist Appt within 5-7 Days Complete -  Home Care Screening Complete -  Medication Review (RN CM) - Complete  Some recent data might be hidden

## 2021-03-30 DIAGNOSIS — E0810 Diabetes mellitus due to underlying condition with ketoacidosis without coma: Secondary | ICD-10-CM | POA: Diagnosis not present

## 2021-03-30 DIAGNOSIS — N179 Acute kidney failure, unspecified: Secondary | ICD-10-CM | POA: Diagnosis not present

## 2021-03-30 DIAGNOSIS — K810 Acute cholecystitis: Secondary | ICD-10-CM | POA: Diagnosis not present

## 2021-03-30 DIAGNOSIS — N390 Urinary tract infection, site not specified: Secondary | ICD-10-CM | POA: Diagnosis not present

## 2021-03-30 LAB — GLUCOSE, CAPILLARY
Glucose-Capillary: 107 mg/dL — ABNORMAL HIGH (ref 70–99)
Glucose-Capillary: 155 mg/dL — ABNORMAL HIGH (ref 70–99)
Glucose-Capillary: 190 mg/dL — ABNORMAL HIGH (ref 70–99)
Glucose-Capillary: 153 mg/dL — ABNORMAL HIGH (ref 70–99)

## 2021-03-30 LAB — BASIC METABOLIC PANEL
Anion gap: 9 (ref 5–15)
BUN: 54 mg/dL — ABNORMAL HIGH (ref 8–23)
CO2: 27 mmol/L (ref 22–32)
Calcium: 8.3 mg/dL — ABNORMAL LOW (ref 8.9–10.3)
Chloride: 101 mmol/L (ref 98–111)
Creatinine, Ser: 2.61 mg/dL — ABNORMAL HIGH (ref 0.44–1.00)
GFR, Estimated: 19 mL/min — ABNORMAL LOW (ref >60)
Glucose, Bld: 136 mg/dL — ABNORMAL HIGH (ref 70–99)
Potassium: 3.9 mmol/L (ref 3.5–5.1)
Sodium: 137 mmol/L (ref 135–145)

## 2021-03-30 LAB — CBC
HCT: 25.6 % — ABNORMAL LOW (ref 36.0–46.0)
Hemoglobin: 7.5 g/dL — ABNORMAL LOW (ref 12.0–15.0)
MCH: 29.2 pg (ref 26.0–34.0)
MCHC: 29.3 g/dL — ABNORMAL LOW (ref 30.0–36.0)
MCV: 99.6 fL (ref 80.0–100.0)
Platelets: 372 10*3/uL (ref 150–400)
RBC: 2.57 MIL/uL — ABNORMAL LOW (ref 3.87–5.11)
RDW: 21.1 % — ABNORMAL HIGH (ref 11.5–15.5)
WBC: 13.1 10*3/uL — ABNORMAL HIGH (ref 4.0–10.5)
nRBC: 0.2 % (ref 0.0–0.2)

## 2021-03-30 LAB — HEPARIN LEVEL (UNFRACTIONATED): Heparin Unfractionated: 0.44 IU/mL (ref 0.30–0.70)

## 2021-03-30 MED ORDER — ENOXAPARIN SODIUM 100 MG/ML ~~LOC~~ SOLN
90.0000 mg | SUBCUTANEOUS | Status: DC
  Administered 2021-03-30 – 2021-04-01 (×3): 90 mg via SUBCUTANEOUS
  Filled 2021-03-30 (×3): qty 1

## 2021-03-30 NOTE — Progress Notes (Addendum)
PROGRESS NOTE        PATIENT DETAILS Name: Tracey Bryant Age: 70 y.o. Sex: female Date of Birth: Aug 02, 1951 Admit Date: 03/16/2021 Admitting Physician Tracey James, MD NGE:[id number], Tracey Leak, FNP  Brief Narrative: Patient is a 70 y.o. female with past medical history of DM-2, HTN, A. fib, chronic systolic heart failure, s/p permanent pacemaker placement-presented to the ED on 3/21  with nausea/vomiting/abdominal pain-further evaluation revealed acute calculus cholecystitis.  Hospital course complicated by AKI, multifactorial anemia, volume overload, age indeterminate left DVT.  See below for further details  Significant events: 3/20>> ED visit for epistaxis 3/21>> admit to [facility] for acute calculus cholecystitis 3/24>> transfer to [facility] for percutaneous gallbladder drain placement  Significant studies: 3/22 >> CT abdomen/pelvis: Cholelithiasis, bilateral small pleural effusions, ascites with anasarca 3/23>> RUQ ultrasound: Acute calculus cholecystitis 3/23>> Echo: EF 25-30%, decreased RV systolic function 2/44>> mild central pulmonary vascular congestion. 4/2>> lower extremity Doppler: Age indeterminate DVT left popliteal vein.  Antimicrobial therapy: Zosyn: 3/22>>3/31  Microbiology data: 3/22>> urine culture: E. Coli 3/22>> blood culture: No growth 3/24>> gallbladder/bile culture: No growth  Procedures : 3/24>> CT-guided cholecystostomy tube placement  Consults: IR, general surgery, nephrology, gastroenterology  DVT Prophylaxis : Place TED hose Start: 03/29/21 1305 Place and maintain sequential compression device Start: 03/21/21 1323 Place and maintain sequential compression device Start: 03/20/21 1411 TED hose Start: 03/16/21 1441 SCDs Start: 03/16/21 1441  IV heparin on 4/12   Subjective: Continues to think that she is slowly improving-continues to have volume overload-hemoglobin is up to 7.5.  No shortness of breath at baseline.  Somewhat  anxious.  Assessment/Plan: Severe sepsis due to acute calculus cholecystitis and UTI: Sepsis physiology has resolved-culture data as above- completed 10 days of Zosyn on 3/31.   Acute calculus cholecystitis:evaluated by general surgery at [facility]-not a surgical candidate-hence percutaneous cholecystostomy tube placed.  IR following.  Transaminitis: Multifactorial-probably due to sepsis/acute cholecystitis-downtrending-continue to follow periodically.  Acute on chronic systolic/diastolic heart failure: Volume status gradually improving-still volume overloaded-continue IV furosemide-weights not done today.  Continue to monitor weights/electrolytes/intake/output.   AKI on CKD stage IIIa: AKI likely hemodynamically mediated-slowly improving-creatinine continues to slowly downtrend-as of volume overloaded-remains on IV Lasix.  Syncope: Occurred on 4/3 while working with physical therapy-she had just gotten out of bed (after several days)-high suspicion that this was due to orthostatic mechanism.  Telemetry unremarkable during this episode.  Acute urinary retention: Foley catheter placed on 3/26-started Flomax on 3/30-successful voiding trial on 4/2-no longer with Foley catheter.  No longer on Flomax.  Hyponatremia: Due to hypervolemia-resolved with diuretics.  Anemia: Multifactorial-some component of acute blood loss due to epistaxis that occurred 1 day prior to this hospitalization-on top of anemia due to chronic disease/critical illness.  Also FOBT positive-no overt GI blood loss.  Is a [name] continues to refuse blood transfusion-she confirms that she will refuse blood-even in the face of life-threatening situation.  Continues to have symptoms from anemia with fatigue and exertional dyspnea (which is also due to CHF).  Completed IV iron-remains on weekly Aranesp.  CBC seems to have stabilized and is gradually uptrending.  Minimize blood draws as much as possible.  Left lower leg DVT:  Age indeterminate-but suspect must have occurred this admission as she is maintained on Eliquis-unfortunately Eliquis was discontinued as patient had epistaxis-and given the severity of anemia.  After extensive  discussion with patient/son-regarding risk/benefits-IV heparin was started on 4/2.  No evidence of any bleeding-CBC stable.  Will change to subcutaneous Lovenox today.    PAF: Appears to be in sinus rhythm-Eliquis initially on hold due to severity of anemia and recent history of epistaxis-however now due to diagnosis of DVT-on therapeutic anticoagulation.  History of CVA: Nonfocal exam-difficult situation-Eliquis on hold-but on therapeutic anticoagulation with Lovenox  HLD: On Zetia  DM-2: CBG stable-continue Lantus 20 units daily and SSI.  Recent Labs    03/29/21 1655 03/29/21 1948 03/30/21 0803  GLUCAP 172* 166* 107*    Obesity: Estimated body mass index is 33.38 kg/m as calculated from the following:   Height as of this encounter: 5\' 6"  (1.676 m).   Weight as of this encounter: 93.8 kg.   Debility/deconditioning: Due to acute illness-PT/OT recommending CIR-however probably will require SNF per notes from CIR. spoke at length with patient's son yesterday-I urged him to speak with our social work team so that we can start the process of insurance authorization etc. for eventual SNF placement when ready for discharge.  Diet: Diet Order            Diet heart healthy/carb modified Room service appropriate? Yes; Fluid consistency: Thin; Fluid restriction: 1500 mL Fluid  Diet effective now                  Code Status: Full code  Family Communication: Son-Tracey [phone]-called on 4/4-unable to leave voicemail.  Disposition Plan: Status is: Inpatient  Remains inpatient appropriate because:Inpatient level of care appropriate due to severity of illness   Dispo: The patient is from: Home              Anticipated d/c is to: SNF              Patient currently is not  medically stable to d/c.   Difficult to place patient No    Barriers to Discharge: Sepsis due to acute calculus cholecystitis-with biliary drain in place-remains significantly volume overloaded on IV diuretics-severe anemia-DVT on IV heparin.  Needs continued inpatient monitoring for above therapies.  Antimicrobial agents: Anti-infectives (From admission, onward)   Start     Dose/Rate Route Frequency Ordered Stop   03/26/21 2200  piperacillin-tazobactam (ZOSYN) IVPB 3.375 g        3.375 g 12.5 mL/hr over 240 Minutes Intravenous Every 12 hours 03/26/21 1137 03/27/21 0158   03/18/21 2200  piperacillin-tazobactam (ZOSYN) IVPB 3.375 g  Status:  Discontinued        3.375 g 12.5 mL/hr over 240 Minutes Intravenous Every 12 hours 03/18/21 1201 03/26/21 1137   03/17/21 1400  piperacillin-tazobactam (ZOSYN) IVPB 3.375 g  Status:  Discontinued        3.375 g 12.5 mL/hr over 240 Minutes Intravenous Every 8 hours 03/17/21 1246 03/18/21 1201       Time spent: 25- minutes-Greater than 50% of this time was spent in counseling, explanation of diagnosis, planning of further management, and coordination of care.  MEDICATIONS: Scheduled Meds: . Chlorhexidine Gluconate Cloth  6 each Topical Daily  . darbepoetin (ARANESP) injection - NON-DIALYSIS  60 mcg Subcutaneous Q Sat-1800  . enoxaparin (LOVENOX) injection  90 mg Subcutaneous Q24H  . ezetimibe  10 mg Oral Daily  . furosemide  80 mg Intravenous Q12H  . insulin aspart  0-15 Units Subcutaneous TID WC  . insulin aspart  0-5 Units Subcutaneous QHS  . insulin glargine  20 Units Subcutaneous Daily  . multivitamin with minerals  1 tablet Oral Daily  . potassium chloride  40 mEq Oral Daily  . sodium bicarbonate  1,300 mg Oral TID  . sodium chloride flush  3 mL Intravenous Q12H  . sodium chloride flush  5 mL Intracatheter Q8H   Continuous Infusions:  PRN Meds:.bisacodyl, dextrose, HYDROmorphone (DILAUDID) injection, ipratropium, levalbuterol,  ondansetron **OR** ondansetron (ZOFRAN) IV, oxyCODONE, senna-docusate, traZODone   PHYSICAL EXAM: Vital signs: Vitals:   03/29/21 1656 03/29/21 2008 03/30/21 0447 03/30/21 0801  BP: (!) 108/56 127/61 117/74 130/64  Pulse: 67 64 60 65  Resp: 18 17 17 17   Temp: 98.4 F (36.9 C) 98.3 F (36.8 C) 98.1 F (36.7 C) 97.9 F (36.6 C)  TempSrc: Oral Axillary Axillary Oral  SpO2: 99% 100% 98% 99%  Weight:      Height:       Filed Weights   03/27/21 0442 03/27/21 0639 03/29/21 0500  Weight: 97.5 kg 95.7 kg 93.8 kg   Body mass index is 33.38 kg/m.   Gen Exam:Alert awake-not in any distress HEENT:atraumatic, normocephalic Chest: B/L clear to auscultation anteriorly CVS:S1S2 regular Abdomen:soft non tender, non distended Extremities:++ edema Neurology: Non focal Skin: no rash  I have personally reviewed following labs and imaging studies  LABORATORY DATA: CBC: Recent Labs  Lab 03/24/21 0113 03/25/21 0217 03/27/21 0046 03/29/21 0208 03/30/21 0249  WBC 19.3* 16.9* 15.6* 12.3* 13.1*  HGB 6.4* 6.6* 7.1* 7.2* 7.5*  HCT 21.1* 21.0* 22.9* 23.7* 25.6*  MCV 97.2 96.8 97.0 98.3 99.6  PLT 298 319 360 348 660    Basic Metabolic Panel: Recent Labs  Lab 03/26/21 0055 03/27/21 0046 03/28/21 0209 03/29/21 0208 03/30/21 0249  NA 133* 134* 134* 138 137  K 3.6 3.5 3.1* 3.8 3.9  CL 103 101 100 102 101  CO2 21* 20* 21* 27 27  GLUCOSE 102* 116* 130* 173* 136*  BUN 63* 62* 58* 55* 54*  CREATININE 2.94* 2.98* 2.91* 2.78* 2.61*  CALCIUM 7.3* 7.8* 7.9* 8.2* 8.3*  MG  --   --   --  2.6*  --     GFR: Estimated Creatinine Clearance: 23.5 mL/min (A) (by C-G formula based on SCr of 2.61 mg/dL (H)).  Liver Function Tests: Recent Labs  Lab 03/24/21 0113 03/25/21 0217 03/26/21 0055  AST 62* 34 27  ALT 370* 272* 200*  ALKPHOS 47 45 39  BILITOT 1.0 1.1 1.0  PROT 5.5* 5.6* 5.7*  ALBUMIN 1.9* 1.9* 1.8*   No results for input(s): LIPASE, AMYLASE in the last 168 hours. No results  for input(s): AMMONIA in the last 168 hours.  Coagulation Profile: No results for input(s): INR, PROTIME in the last 168 hours.  Cardiac Enzymes: No results for input(s): CKTOTAL, CKMB, CKMBINDEX, TROPONINI in the last 168 hours.  BNP (last 3 results) No results for input(s): PROBNP in the last 8760 hours.  Lipid Profile: No results for input(s): CHOL, HDL, LDLCALC, TRIG, CHOLHDL, LDLDIRECT in the last 72 hours.  Thyroid Function Tests: No results for input(s): TSH, T4TOTAL, FREET4, T3FREE, THYROIDAB in the last 72 hours.  Anemia Panel: No results for input(s): VITAMINB12, FOLATE, FERRITIN, TIBC, IRON, RETICCTPCT in the last 72 hours.  Urine analysis:    Component Value Date/Time   COLORURINE AMBER (A) 03/17/2021 1208   APPEARANCEUR CLOUDY (A) 03/17/2021 1208   LABSPEC 1.015 03/17/2021 1208   PHURINE 5.0 03/17/2021 1208   GLUCOSEU 50 (A) 03/17/2021 1208   HGBUR SMALL (A) 03/17/2021 [place] 03/17/2021 1208   Benjamin Stain  NEGATIVE 03/17/2021 1208   PROTEINUR >=300 (A) 03/17/2021 1208   UROBILINOGEN 0.2 01/17/2013 1910   NITRITE NEGATIVE 03/17/2021 1208   LEUKOCYTESUR LARGE (A) 03/17/2021 1208    Sepsis Labs: Lactic Acid, Venous    Component Value Date/Time   LATICACIDVEN 1.4 03/21/2021 0805    MICROBIOLOGY: No results found for this or any previous visit (from the past 240 hour(s)).  RADIOLOGY STUDIES/RESULTS: No results found.   LOS: 14 days   Oren Binet, MD  [facility]    To contact the attending provider between 7A-7P or the covering provider during after hours 7P-7A, please log into the web site [email or web address] and access using universal [place] password for that web site. If you do not have the password, please call the hospital operator.  03/30/2021, 12:16 PM

## 2021-03-30 NOTE — NC FL2 (Signed)
[place] LEVEL OF CARE SCREENING TOOL     IDENTIFICATION  Patient Name: Tracey Bryant Birthdate: Sep 22, 1951 Sex: female Admission Date (Current Location): 03/16/2021  [facility] and [place] Number:   Betsy Pries)   Facility and Address:  [place] [facility] [employer] [address], [place], [place] [address]      Provider Number: [id number]  Attending Physician Name and Address:  Jonetta Osgood, MD  Relative Name and Phone Number:  [name], son, [phone]    Current Level of Care: Hospital Recommended Level of Care: [place] Prior Approval Number:    Date Approved/Denied:   PASRR Number:   [id number]  Discharge Plan: SNF    Current Diagnoses: Patient Active Problem List   Diagnosis Date Noted  . Severe sepsis ([place]) 03/19/2021  . Acute cholecystitis 03/19/2021  . Acute lower UTI 03/19/2021  . Cholelithiasis without cholangitis 03/18/2021  . Uncontrolled type 2 diabetes mellitus with hyperglycemia, with long-term current use of insulin ([place]) 03/18/2021  . DKA (diabetic ketoacidosis) ([place]) 03/16/2021  . AKI (acute kidney injury) ([place]) 03/16/2021  . 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[place]) 03/16/2021  . Acute posthemorrhagic anemia 03/16/2021  . Epistaxis 03/16/2021  . Chronic anticoagulation 03/16/2021  . CVA (cerebral vascular accident) ([place]) 03/16/2021  . 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 Diabetes mellitus ([place]) 02/15/2013  . Hypertension 02/15/2013  . Gall bladder stones 02/15/2013  . Multiple lung nodules 02/15/2013    Orientation RESPIRATION BLADDER Height & Weight     Self,Time,Situation,Place  Normal Continent,External catheter Weight: 206 lb 12.7 oz (93.8 kg) Height:  5\' 6"  (167.6 cm)  BEHAVIORAL SYMPTOMS/MOOD NEUROLOGICAL BOWEL NUTRITION STATUS      Continent Diet (Please see DC Summary)  AMBULATORY STATUS COMMUNICATION OF NEEDS Skin   Extensive Assist Verbally Normal                        Personal Care Assistance Level of Assistance  Bathing,Feeding,Dressing Bathing Assistance: Maximum assistance Feeding assistance: Limited assistance Dressing Assistance: Maximum assistance     Functional Limitations Info  Sight Sight Info: Impaired        SPECIAL CARE FACTORS FREQUENCY  PT (By licensed PT),OT (By licensed OT)     PT Frequency: 5x/week OT Frequency: 5x/week            Contractures Contractures Info: Not present    Additional Factors Info  Code Status,Allergies,Insulin Sliding Scale Code Status Info: Full Allergies Info: NKA   Insulin Sliding Scale Info: See dc summary       Current Medications (03/30/2021):  This is the current hospital active medication list Current Facility-Administered Medications  Medication Dose Route Frequency Provider Last Rate Last Admin  . bisacodyl (DULCOLAX) EC tablet 5 mg  5 mg Oral Daily PRN Orson Eva, MD   5 mg at 03/17/21 1707  . Chlorhexidine Gluconate Cloth 2 % PADS 6 each  6 each Topical Daily Tat, David, MD   6 each at 03/30/21 [phone]  . Darbepoetin Alfa (ARANESP) injection 60 mcg  60 mcg Subcutaneous Q Sat-1800 Jonetta Osgood, MD   60 mcg at 03/28/21 1737  . dextrose 50 % solution 0-50 mL  0-50 mL Intravenous PRN Tat, David, MD      . enoxaparin (LOVENOX) injection 90 mg  90 mg Subcutaneous Q24H Jonetta Osgood, MD   90 mg at 03/30/21 0932  . ezetimibe (ZETIA) tablet 10 mg  10 mg Oral Daily Tat, Shanon Brow, MD  10 mg at 03/30/21 0928  . furosemide (LASIX) injection 80 mg  80 mg Intravenous Q12H Jonetta Osgood, MD   80 mg at 03/30/21 0929  . HYDROmorphone (DILAUDID) injection 0.5 mg  0.5 mg Intravenous Q4H PRN Elgergawy, Silver Huguenin, MD   0.5 mg at 03/29/21 1948  . insulin aspart (novoLOG) injection 0-15 Units  0-15 Units Subcutaneous TID WC Orson Eva, MD   3 Units at 03/30/21 1346  . insulin aspart (novoLOG) injection 0-5 Units  0-5 Units Subcutaneous Benay Pike, MD   3 Units at 03/24/21 2140  . insulin  glargine (LANTUS) injection 20 Units  20 Units Subcutaneous Daily Elgergawy, Silver Huguenin, MD   20 Units at 03/30/21 0933  . ipratropium (ATROVENT) nebulizer solution 0.5 mg  0.5 mg Nebulization Q6H PRN Tat, Shanon Brow, MD   0.5 mg at 03/27/21 1846  . levalbuterol (XOPENEX) nebulizer solution 0.63 mg  0.63 mg Nebulization Q6H PRN Tat, David, MD      . multivitamin with minerals tablet 1 tablet  1 tablet Oral Daily Elgergawy, Silver Huguenin, MD   1 tablet at 03/30/21 0927  . ondansetron (ZOFRAN) tablet 4 mg  4 mg Oral Q6H PRN Tat, Shanon Brow, MD   4 mg at 03/29/21 0941   Or  . ondansetron (ZOFRAN) injection 4 mg  4 mg Intravenous Q6H PRN Orson Eva, MD   4 mg at 03/19/21 2059  . oxyCODONE (Oxy IR/ROXICODONE) immediate release tablet 5 mg  5 mg Oral Q4H PRN Orson Eva, MD   5 mg at 03/30/21 1501  . potassium chloride SA (KLOR-CON) CR tablet 40 mEq  40 mEq Oral Daily Jonetta Osgood, MD   40 mEq at 03/30/21 0928  . senna-docusate (Senokot-S) tablet 1 tablet  1 tablet Oral QHS PRN Orson Eva, MD   1 tablet at 03/17/21 2116  . sodium bicarbonate tablet 1,300 mg  1,300 mg Oral TID Elgergawy, Silver Huguenin, MD   1,300 mg at 03/30/21 1501  . sodium chloride flush (NS) 0.9 % injection 3 mL  3 mL Intravenous Therisa Doyne, MD   3 mL at 03/30/21 0942  . sodium chloride flush (NS) 0.9 % injection 5 mL  5 mL Intracatheter Q8H Suttle, Dylan J, MD   5 mL at 03/30/21 1347  . traZODone (DESYREL) tablet 25 mg  25 mg Oral QHS PRN Orson Eva, MD   25 mg at 03/29/21 2024     Discharge Medications: Please see discharge summary for a list of discharge medications.  Relevant Imaging Results:  Relevant Lab Results:   Additional Information SSN: [id number]. Unvaccinated for [place].  Benard Halsted, LCSW

## 2021-03-30 NOTE — Progress Notes (Signed)
Referring Physician(s): Elgergawy,D  Supervising Physician: Sandi Mariscal  Patient Status:  [facility] - In-pt  Chief Complaint: cholecystitis   Subjective: Pt sitting up in chair; states she feels better today; denies worsening abd pain,N/V  Allergies: Patient has no known allergies.  Medications: Prior to Admission medications   Medication Sig Start Date End Date Taking? Authorizing Provider  apixaban (ELIQUIS) 5 MG TABS tablet Take 5 mg by mouth 2 (two) times daily.   Yes [provider]  ezetimibe (ZETIA) 10 MG tablet Take 10 mg by mouth daily.   Yes [provider]  furosemide (LASIX) 20 MG tablet Take 20 mg by mouth.   Yes [provider]  glipiZIDE (GLUCOTROL XL) 5 MG 24 hr tablet Take 5 mg by mouth daily with breakfast.   Yes [provider]  insulin glargine (LANTUS) 100 UNIT/ML injection Inject 50 Units into the skin at bedtime.   Yes [provider]  insulin NPH-regular (NOVOLIN 70/30) (70-30) 100 UNIT/ML injection Inject 10 Units into the skin 3 (three) times daily after meals.   Yes [provider]     Vital Signs: BP (!) 119/55 (BP Location: Right Arm)   Pulse 69   Temp 97.9 F (36.6 C) (Oral)   Resp 20   Ht 5\' 6"  (1.676 m)   Wt 206 lb 12.7 oz (93.8 kg)   SpO2 93%   BMI 33.38 kg/m   Physical Exam awake/alert; GB drain intact, insertion site ok, mildly tender, OP 190 cc tan colored bile, no hemobilia; drain to JP bulb  Imaging: DG Chest Port 1 View  Result Date: 03/27/2021 CLINICAL DATA:  Shortness of breath EXAM: PORTABLE CHEST 1 VIEW COMPARISON:  03/20/2021 FINDINGS: Left pacer remains in place, unchanged. Cardiomegaly. Small bilateral pleural effusions with bibasilar atelectasis. No overt edema. No acute bony abnormality. IMPRESSION: Small effusions with bibasilar atelectasis. Electronically Signed   By: Rolm Baptise M.D.   On: 03/27/2021 19:05   VAS [place] LOWER EXTREMITY VENOUS (DVT)  Result Date:  03/28/2021  Lower Venous DVT Study Indications: Swelling, history of CHF.  Limitations: Poor ultrasound/tissue interface and body habitus. Comparison Study: No prior studies. Performing Technologist: Darlin Coco RDMS,RVT  Examination Guidelines: A complete evaluation includes B-mode imaging, spectral Doppler, color Doppler, and power Doppler as needed of all accessible portions of each vessel. Bilateral testing is considered an integral part of a complete examination. Limited examinations for reoccurring indications may be performed as noted. The reflux portion of the exam is performed with the patient in reverse Trendelenburg.  +---------+---------------+---------+-----------+----------+-------------------+ RIGHT    CompressibilityPhasicitySpontaneityPropertiesThrombus Aging      +---------+---------------+---------+-----------+----------+-------------------+ CFV      Full           Yes      Yes                                      +---------+---------------+---------+-----------+----------+-------------------+ SFJ      Full                                                             +---------+---------------+---------+-----------+----------+-------------------+ FV Prox  Full                                                             +---------+---------------+---------+-----------+----------+-------------------+  FV Mid   Full                                                             +---------+---------------+---------+-----------+----------+-------------------+ FV DistalFull                                                             +---------+---------------+---------+-----------+----------+-------------------+ PFV      Full                                                             +---------+---------------+---------+-----------+----------+-------------------+ POP      Full           Yes      Yes                                       +---------+---------------+---------+-----------+----------+-------------------+ PTV      Full                                         Some segments not                                                         well visualized     +---------+---------------+---------+-----------+----------+-------------------+ PERO     Full                                         Some segments not                                                         well visualized     +---------+---------------+---------+-----------+----------+-------------------+   +---------+---------------+---------+-----------+----------+-------------------+ LEFT     CompressibilityPhasicitySpontaneityPropertiesThrombus Aging      +---------+---------------+---------+-----------+----------+-------------------+ CFV      Full           Yes      Yes                                      +---------+---------------+---------+-----------+----------+-------------------+ SFJ      Full                                                             +---------+---------------+---------+-----------+----------+-------------------+  FV Prox  Full                                                             +---------+---------------+---------+-----------+----------+-------------------+ FV Mid   Full                                                             +---------+---------------+---------+-----------+----------+-------------------+ FV DistalFull                                                             +---------+---------------+---------+-----------+----------+-------------------+ PFV      Full                                                             +---------+---------------+---------+-----------+----------+-------------------+ POP      Partial        Yes      Yes                  Age Indeterminate   +---------+---------------+---------+-----------+----------+-------------------+  PTV      Full                                                             +---------+---------------+---------+-----------+----------+-------------------+ PERO     Full                                         Some segments not                                                         well visualized     +---------+---------------+---------+-----------+----------+-------------------+     Summary: RIGHT: - There is no evidence of deep vein thrombosis in the lower extremity. However, portions of this examination were limited- see technologist comments above.  - No cystic structure found in the popliteal fossa.  LEFT: - Findings consistent with age indeterminate deep vein thrombosis involving the left popliteal vein. - No cystic structure found in the popliteal fossa. - Ultrasound characteristics of enlarged lymph nodes noted in the groin.  *See table(s) above for measurements and observations. Electronically signed by Monica Martinez MD on 03/28/2021 at 11:14:58 AM.    Final     Labs:  CBC: Recent Labs    03/25/21 0217 03/27/21  2778 03/29/21 0208 03/30/21 0249  WBC 16.9* 15.6* 12.3* 13.1*  HGB 6.6* 7.1* 7.2* 7.5*  HCT 21.0* 22.9* 23.7* 25.6*  PLT 319 360 348 372    COAGS: Recent Labs    03/18/21 0630 03/19/21 0504 03/20/21 0147 03/21/21 0115  INR 1.6* 1.4* 1.6* 1.5*    BMP: Recent Labs    03/27/21 0046 03/28/21 0209 03/29/21 0208 03/30/21 0249  NA 134* 134* 138 137  K 3.5 3.1* 3.8 3.9  CL 101 100 102 101  CO2 20* 21* 27 27  GLUCOSE 116* 130* 173* 136*  BUN 62* 58* 55* 54*  CALCIUM 7.8* 7.9* 8.2* 8.3*  CREATININE 2.98* 2.91* 2.78* 2.61*  GFRNONAA 16* 17* 18* 19*    LIVER FUNCTION TESTS: Recent Labs    03/23/21 0231 03/24/21 0113 03/25/21 0217 03/26/21 0055  BILITOT 0.9 1.0 1.1 1.0  AST 106* 62* 34 27  ALT 537* 370* 272* 200*  ALKPHOS 53 47 45 39  PROT 5.6* 5.5* 5.6* 5.7*  ALBUMIN 2.0* 1.9* 1.9* 1.8*    Assessment and Plan: Pt with hx acute  calculus cholecystitis, poor surgical candidate; s/p perc GB drain 3/24; afebrile; ; creat 2.61(2.78), WBC 13.1(12.3), hgb 7.5(7.2), bile cx neg; cont current tx/drain irrigation/OP monitoring; drain will need to remain in place at least 4-6 weeks unless removed surgically in interim; f/u cholangiogram/drain exchange ordered in 6-8 weeks; if problems occur with drain in the meantime can evaluate with cholangiogram sooner   Electronically Signed: D. Rowe Robert, PA-C 03/30/2021, 1:38 PM   I spent a total of 15 minutes at the the patient's bedside AND on the patient's hospital floor or unit, greater than 50% of which was counseling/coordinating care for gallbladder drain    Patient ID: Tracey Bryant, female   DOB: 08-24-1951, 70 y.o.   MRN: [id number]

## 2021-03-30 NOTE — TOC Progression Note (Signed)
Transition of Care [facility]) - Progression Note    Patient Details  Name: Tracey Bryant MRN: [id number] Date of Birth: 07-Sep-1951  Transition of Care [facility]) CM/SW [place], LCSW Phone Number: 03/30/2021, 5:42 PM  Clinical Narrative:    CSW contacted patient's son regarding patient's discharge plan as per MD request. Patient's son requested to speak tomorrow in the late morning. CSW will follow up.    Expected Discharge Plan: [place] Barriers to Discharge: Continued Medical Work up  Expected Discharge Plan and Services Expected Discharge Plan: [place] In-house Referral: Clinical Social Work     Living arrangements for the past 2 months: Single Family Home                                       Social Determinants of Health (SDOH) Interventions    Readmission Risk Interventions Readmission Risk Prevention Plan 03/19/2021 03/17/2021  Transportation Screening - Complete  PCP or Specialist Appt within 5-7 Days Complete -  Home Care Screening Complete -  Medication Review (RN CM) - Complete  Some recent data might be hidden

## 2021-03-30 NOTE — Progress Notes (Addendum)
ANTICOAGULATION CONSULT NOTE Pharmacy Consult for Heparin to Lovenox Indication: DVT  No Known Allergies  Patient Measurements: Height: 5\' 6"  (167.6 cm) Weight: 93.8 kg (206 lb 12.7 oz) IBW/kg (Calculated) : 59.3  Vital Signs: Temp: 97.9 F (36.6 C) (04/04 0801) Temp Source: Oral (04/04 0801) BP: 130/64 (04/04 0801) Pulse Rate: 65 (04/04 0801)  Labs: Recent Labs    03/28/21 0209 03/28/21 2028 03/29/21 0208 03/30/21 0249  HGB  --   --  7.2* 7.5*  HCT  --   --  23.7* 25.6*  PLT  --   --  348 372  HEPARINUNFRC  --  0.43 0.36 0.44  CREATININE 2.91*  --  2.78* 2.61*    Estimated Creatinine Clearance: 23.5 mL/min (A) (by C-G formula based on SCr of 2.61 mg/dL (H)).   Assessment: 70 y.o. female being evaluated for acute calculus cholecystitis. Hospital course complicated by AKI, multifactorial anemia, volume overload and now a new diagnosis of DVT. Pharmacy consulted to start heparin therapy and asked to maintain at the low end of the range due to ongoing anemia and that patient is a Jehovah's witness.  Transitioning to Lovenox this AM  Goal of Therapy:  Heparin level 0.3-0.5 units/ml Monitor platelets by anticoagulation protocol: Yes   Plan:  Heparin to Lovenox 90 mg sq Q 24 hours Continue to monitor H&H and platelets  Thank you Anette Guarneri, PharmD Please see AMION for all Pharmacists' Contact Phone Numbers 03/30/2021, 8:29 AM

## 2021-03-30 NOTE — Progress Notes (Signed)
Physical Therapy Treatment Patient Details Name: Tracey Bryant MRN: [id number] DOB: 07-12-1951 Today's Date: 03/30/2021    History of Present Illness 70 y.o. female with presented to [facility] ED on 3/21  with nausea/vomiting/abdominal pain-further evaluation revealed acute calculus cholecystitis. Transferred to [facility] 3/24 for percutaneous gallbladder drain placement  Hospital course complicated by AKI, multifactorial anemia, and volume overload PMH: past medical history of DM-2, HTN, A. fib, chronic systolic heart failure, s/p permanent pacemaker placement    PT Comments    Pt with increased apprehension about getting up to chair, following sycopal episode due to orthostatic hypotension over the weekend. Pt agreeable with increased encouragement. Pt min A for bed mobility, and min Ax2 for transfers. Pt with incontinence of bowels and bladder on standing. On second attempt at standing pt requires min Ax2 to come to standing. Once in standing pt locked out her knees to maintain balance. Pt unlocked knee to attempt ambulation, pt with bilateral knee buckling with ambulation requiring totalAx2 for safely returning to seated EoB. Pt is min Ax2 for stand pivot to chair with bilateral knees blocked. Pt with slight orthostatic responds but did not exhibit any symptoms. Pt very happy with her progress today. PT continues to recommend SNF level rehab. PT will continue to follow acutely.  Orthostatic BPs  Supine 122/66  Sitting 130/64  Sitting after 3 min 131/64  Standing 117/60  Standing after 3 min  118/64       Follow Up Recommendations  SNF     Equipment Recommendations  None recommended by PT       Precautions / Restrictions Precautions Precautions: Fall Precaution Comments: monitor BP, hemoglobin Restrictions Weight Bearing Restrictions: No    Mobility  Bed Mobility Overal bed mobility: Needs Assistance Bed Mobility: Supine to Sit;Sit to Supine     Supine to sit: Min assist;HOB  elevated     General bed mobility comments: min A to come to EOB with increased use of bedrail to pull to EoB and min A for bringing trunk to upright    Transfers Overall transfer level: Needs assistance     Sit to Stand: Min assist;+2 physical assistance Stand pivot transfers: Min assist       General transfer comment: min Ax2 for powering up to standing, pt with increased fear of syncope, however does not feel dizzy in standing, however does become incontinent of stool and urine requiring her to sit back down for cleaning up pt and surrounding area, once clean pt able to come to standing with min Ax2, after attempting ambulation, pt minAx2 for stand pivot transfer of pt from bed to chair with bilateral knees blocked  Ambulation/Gait Ambulation/Gait assistance: Total assist Gait Distance (Feet): 1 Feet Assistive device: Rolling walker (2 wheeled) Gait Pattern/deviations: Step-through pattern;Decreased step length - right;Decreased step length - left Gait velocity: slowed Gait velocity interpretation: <1.31 ft/sec, indicative of household ambulator General Gait Details: pt with bilateral knees locked out in standing with min guard, however once she unlocked her knee to step pt became increasing unsteady ultimately requiring totalAx2 for safely getting back to seated EoB       Balance Overall balance assessment: Needs assistance Sitting-balance support: Feet supported;Bilateral upper extremity supported;No upper extremity supported;Single extremity supported Sitting balance-Leahy Scale: Fair     Standing balance support: Bilateral upper extremity supported;During functional activity Standing balance-Leahy Scale: Poor  Cognition Arousal/Alertness: Awake/alert Behavior During Therapy: WFL for tasks assessed/performed Overall Cognitive Status: Within Functional Limits for tasks assessed                                  General Comments: very apprehensive about getting up due to prior syncopal episode         General Comments General comments (skin integrity, edema, etc.): VSS on RA, knee high TED hose in place      Pertinent Vitals/Pain Pain Assessment: No/denies pain Faces Pain Scale: No hurt           PT Goals (current goals can now be found in the care plan section) Acute Rehab PT Goals Patient Stated Goal: get up without passing out PT Goal Formulation: With patient Time For Goal Achievement: 04/09/21 Potential to Achieve Goals: Good Progress towards PT goals: Progressing toward goals    Frequency    Min 3X/week      PT Plan Current plan remains appropriate       AM-PAC PT "6 Clicks" Mobility   Outcome Measure  Help needed turning from your back to your side while in a flat bed without using bedrails?: A Little Help needed moving from lying on your back to sitting on the side of a flat bed without using bedrails?: A Little Help needed moving to and from a bed to a chair (including a wheelchair)?: A Lot Help needed standing up from a chair using your arms (e.g., wheelchair or bedside chair)?: A Lot Help needed to walk in hospital room?: A Lot Help needed climbing 3-5 steps with a railing? : Total 6 Click Score: 13    End of Session Equipment Utilized During Treatment: Gait belt Activity Tolerance: Patient tolerated treatment well Patient left: with call bell/phone within reach;in chair;with chair alarm set;with nursing/sitter in room Nurse Communication: Mobility status;Other (comment) (no orthostatic hypotension) PT Visit Diagnosis: Unsteadiness on feet (R26.81);Other abnormalities of gait and mobility (R26.89);Muscle weakness (generalized) (M62.81)     Time: 1000-1031 PT Time Calculation (min) (ACUTE ONLY): 31 min  Charges:  $Therapeutic Activity: 23-37 mins                     Mirielle Byrum B. Migdalia Dk PT, DPT Acute Rehabilitation Services Pager [phone] Office [phone]    [place] 03/30/2021, 2:22 PM

## 2021-03-31 ENCOUNTER — Other Ambulatory Visit: Payer: Self-pay | Admitting: Radiology

## 2021-03-31 DIAGNOSIS — N179 Acute kidney failure, unspecified: Secondary | ICD-10-CM | POA: Diagnosis not present

## 2021-03-31 DIAGNOSIS — K810 Acute cholecystitis: Secondary | ICD-10-CM | POA: Diagnosis not present

## 2021-03-31 DIAGNOSIS — E0810 Diabetes mellitus due to underlying condition with ketoacidosis without coma: Secondary | ICD-10-CM | POA: Diagnosis not present

## 2021-03-31 DIAGNOSIS — N390 Urinary tract infection, site not specified: Secondary | ICD-10-CM | POA: Diagnosis not present

## 2021-03-31 DIAGNOSIS — K8020 Calculus of gallbladder without cholecystitis without obstruction: Secondary | ICD-10-CM

## 2021-03-31 LAB — GLUCOSE, CAPILLARY
Glucose-Capillary: 118 mg/dL — ABNORMAL HIGH (ref 70–99)
Glucose-Capillary: 103 mg/dL — ABNORMAL HIGH (ref 70–99)
Glucose-Capillary: 125 mg/dL — ABNORMAL HIGH (ref 70–99)
Glucose-Capillary: 156 mg/dL — ABNORMAL HIGH (ref 70–99)

## 2021-03-31 LAB — BASIC METABOLIC PANEL
Anion gap: 10 (ref 5–15)
BUN: 50 mg/dL — ABNORMAL HIGH (ref 8–23)
CO2: 30 mmol/L (ref 22–32)
Calcium: 8.7 mg/dL — ABNORMAL LOW (ref 8.9–10.3)
Chloride: 98 mmol/L (ref 98–111)
Creatinine, Ser: 2.56 mg/dL — ABNORMAL HIGH (ref 0.44–1.00)
GFR, Estimated: 20 mL/min — ABNORMAL LOW (ref >60)
Glucose, Bld: 142 mg/dL — ABNORMAL HIGH (ref 70–99)
Potassium: 4.6 mmol/L (ref 3.5–5.1)
Sodium: 138 mmol/L (ref 135–145)

## 2021-03-31 MED ORDER — METOLAZONE 5 MG PO TABS
5.0000 mg | ORAL_TABLET | Freq: Once | ORAL | Status: AC
  Administered 2021-03-31: 5 mg via ORAL
  Filled 2021-03-31: qty 1

## 2021-03-31 NOTE — Progress Notes (Signed)
PROGRESS NOTE        PATIENT DETAILS Name: Tracey Bryant Age: 70 y.o. Sex: female Date of Birth: 22-Apr-1951 Admit Date: 03/16/2021 Admitting Physician Deatra James, MD JSE:[id number], Arlyn Leak, FNP  Brief Narrative: Patient is a 70 y.o. female with past medical history of DM-2, HTN, A. fib, chronic systolic heart failure, s/p permanent pacemaker placement-presented to the ED on 3/21  with nausea/vomiting/abdominal pain-further evaluation revealed acute calculus cholecystitis.  Hospital course complicated by AKI, multifactorial anemia, volume overload, age indeterminate left DVT.  See below for further details  Significant events: 3/20>> ED visit for epistaxis 3/21>> admit to [facility] for acute calculus cholecystitis 3/24>> transfer to [facility] for percutaneous gallbladder drain placement  Significant studies: 3/22 >> CT abdomen/pelvis: Cholelithiasis, bilateral small pleural effusions, ascites with anasarca 3/23>> RUQ ultrasound: Acute calculus cholecystitis 3/23>> Echo: EF 25-30%, decreased RV systolic function 1/60>> mild central pulmonary vascular congestion. 4/2>> lower extremity Doppler: Age indeterminate DVT left popliteal vein.  Antimicrobial therapy: Zosyn: 3/22>>3/31  Microbiology data: 3/22>> urine culture: E. Coli 3/22>> blood culture: No growth 3/24>> gallbladder/bile culture: No growth  Procedures : 3/24>> CT-guided cholecystostomy tube placement  Consults: IR, general surgery, nephrology, gastroenterology  DVT Prophylaxis : Place TED hose Start: 03/29/21 1305 Place and maintain sequential compression device Start: 03/21/21 1323 Place and maintain sequential compression device Start: 03/20/21 1411 TED hose Start: 03/16/21 1441 SCDs Start: 03/16/21 1441  IV heparin on 4/12   Subjective: No major issues overnight-finally was able to stand with physical therapy yesterday.  Still very anxious about getting out of bed-I have provided  extensive reassurance.  Still with significant lower extremity edema.  Assessment/Plan: Severe sepsis due to acute calculus cholecystitis and UTI: Sepsis physiology has resolved-culture data as above- completed 10 days of Zosyn on 3/31.   Acute calculus cholecystitis:evaluated by general surgery at [facility]-not a surgical candidate-hence percutaneous cholecystostomy tube placed.  IR following.  Transaminitis: Multifactorial-probably due to sepsis/acute cholecystitis-downtrending-continue to follow periodically.  Acute on chronic systolic/diastolic heart failure: Although improved-stable significantly volume overloaded-repeat 1 dose of metolazone today-remains on Lasix IV 80 mg twice daily.  Weight is unreliable-however she is now negative follow closely-monitor weight/electrolytes/intake/output.   AKI on CKD stage IIIa: AKI likely hemodynamically mediated-gradually improving-creatinine downtrending but still not yet at baseline.  Remains grossly volume overloaded-on diuretics as noted above.   Syncope: Occurred on 4/3 while working with physical therapy-she had just gotten out of bed (after several days)-high suspicion that this was due to orthostatic mechanism.  Telemetry unremarkable during this episode.  Acute urinary retention: Foley catheter placed on 3/26-started Flomax on 3/30-successful voiding trial on 4/2-no longer with Foley catheter.  No longer on Flomax.  Hyponatremia: Due to hypervolemia-resolved with diuretics.  Anemia: Multifactorial-some component of acute blood loss due to epistaxis that occurred 1 day prior to this hospitalization-on top of anemia due to chronic disease/critical illness.  Also FOBT positive-no overt GI blood loss.  Is a [name] continues to refuse blood transfusion-she confirms that she will refuse blood-even in the face of life-threatening situation.  Hemoglobin levels are slowly improving-we will recheck again tomorrow-completed IV iron-on weekly  Aranesp.  Continue to minimize blood draws as much as possible.  She does continue to have some amount of fatigue/shortness of breath/tiredness not only from CHF but also from anemia.    Left lower leg DVT: Age  indeterminate-but suspect must have occurred this admission as she is maintained on Eliquis-unfortunately Eliquis was discontinued as patient had epistaxis-and given the severity of anemia.  After extensive discussion with patient/son-regarding risk/benefits-IV heparin was started on 4/2 and subsequently transitioned to SQ Lovenox on 4/4.  Tolerating anticoagulation without any evidence of bleeding.    PAF: Appears to be in sinus rhythm-Eliquis initially on hold due to severity of anemia and recent history of epistaxis-however now due to diagnosis of DVT-on therapeutic anticoagulation.  History of CVA: Nonfocal exam-difficult situation-Eliquis on hold-but on therapeutic anticoagulation with Lovenox  HLD: On Zetia  DM-2: CBG stable-continue Lantus 20 units daily and SSI.  Recent Labs    03/30/21 2023 03/31/21 0734 03/31/21 1137  GLUCAP 153* 118* 103*    Obesity: Estimated body mass index is 34.19 kg/m as calculated from the following:   Height as of this encounter: 5\' 6"  (1.676 m).   Weight as of this encounter: 96.1 kg.   Debility/deconditioning: Due to acute illness-PT/OT recommending CIR-however probably will require SNF per notes from CIR. spoke at length with patient's son yesterday-I urged him to speak with our social work team so that we can start the process of insurance authorization etc. for eventual SNF placement when ready for discharge.  Diet: Diet Order            Diet heart healthy/carb modified Room service appropriate? Yes; Fluid consistency: Thin; Fluid restriction: 1500 mL Fluid  Diet effective now                  Code Status: Full code  Family Communication: Son-[phone]-called on 4/5-unable to leave voicemail.  Disposition Plan: Status  is: Inpatient  Remains inpatient appropriate because:Inpatient level of care appropriate due to severity of illness   Dispo: The patient is from: Home              Anticipated d/c is to: SNF              Patient currently is not medically stable to d/c.   Difficult to place patient No    Barriers to Discharge: Sepsis due to acute calculus cholecystitis-with biliary drain in place-remains significantly volume overloaded on IV diuretics-severe anemia-DVT on IV heparin.  Needs continued inpatient monitoring for above therapies.  Antimicrobial agents: Anti-infectives (From admission, onward)   Start     Dose/Rate Route Frequency Ordered Stop   03/26/21 2200  piperacillin-tazobactam (ZOSYN) IVPB 3.375 g        3.375 g 12.5 mL/hr over 240 Minutes Intravenous Every 12 hours 03/26/21 1137 03/27/21 0158   03/18/21 2200  piperacillin-tazobactam (ZOSYN) IVPB 3.375 g  Status:  Discontinued        3.375 g 12.5 mL/hr over 240 Minutes Intravenous Every 12 hours 03/18/21 1201 03/26/21 1137   03/17/21 1400  piperacillin-tazobactam (ZOSYN) IVPB 3.375 g  Status:  Discontinued        3.375 g 12.5 mL/hr over 240 Minutes Intravenous Every 8 hours 03/17/21 1246 03/18/21 1201       Time spent: 25- minutes-Greater than 50% of this time was spent in counseling, explanation of diagnosis, planning of further management, and coordination of care.  MEDICATIONS: Scheduled Meds: . Chlorhexidine Gluconate Cloth  6 each Topical Daily  . darbepoetin (ARANESP) injection - NON-DIALYSIS  60 mcg Subcutaneous Q Sat-1800  . enoxaparin (LOVENOX) injection  90 mg Subcutaneous Q24H  . ezetimibe  10 mg Oral Daily  . furosemide  80 mg Intravenous Q12H  . insulin aspart  0-15 Units Subcutaneous TID WC  . insulin aspart  0-5 Units Subcutaneous QHS  . insulin glargine  20 Units Subcutaneous Daily  . metolazone  5 mg Oral Once  . multivitamin with minerals  1 tablet Oral Daily  . potassium chloride  40 mEq Oral Daily  .  sodium bicarbonate  1,300 mg Oral TID  . sodium chloride flush  3 mL Intravenous Q12H  . sodium chloride flush  5 mL Intracatheter Q8H   Continuous Infusions:  PRN Meds:.bisacodyl, dextrose, HYDROmorphone (DILAUDID) injection, ipratropium, levalbuterol, ondansetron **OR** ondansetron (ZOFRAN) IV, oxyCODONE, senna-docusate, traZODone   PHYSICAL EXAM: Vital signs: Vitals:   03/31/21 0012 03/31/21 0322 03/31/21 0731 03/31/21 1141  BP: (!) 125/53 99/84 (!) 131/58 (!) 124/57  Pulse: 73 72 71 67  Resp: 18 18 16 19   Temp: 97.8 F (36.6 C) 97.9 F (36.6 C) 97.7 F (36.5 C) 98.2 F (36.8 C)  TempSrc: Oral Oral Oral Oral  SpO2: 94% 96% 95% 92%  Weight:   96.1 kg   Height:       Filed Weights   03/27/21 0639 03/29/21 0500 03/31/21 0731  Weight: 95.7 kg 93.8 kg 96.1 kg   Body mass index is 34.19 kg/m.   Gen Exam:Alert awake-not in any distress HEENT:atraumatic, normocephalic Chest: B/L clear to auscultation anteriorly CVS:S1S2 regular Abdomen:soft non tender, non distended Extremities++ edema Neurology: Non focal Skin: no rash  I have personally reviewed following labs and imaging studies  LABORATORY DATA: CBC: Recent Labs  Lab 03/25/21 0217 03/27/21 0046 03/29/21 0208 03/30/21 0249  WBC 16.9* 15.6* 12.3* 13.1*  HGB 6.6* 7.1* 7.2* 7.5*  HCT 21.0* 22.9* 23.7* 25.6*  MCV 96.8 97.0 98.3 99.6  PLT 319 360 348 161    Basic Metabolic Panel: Recent Labs  Lab 03/27/21 0046 03/28/21 0209 03/29/21 0208 03/30/21 0249 03/31/21 0114  NA 134* 134* 138 137 138  K 3.5 3.1* 3.8 3.9 4.6  CL 101 100 102 101 98  CO2 20* 21* 27 27 30   GLUCOSE 116* 130* 173* 136* 142*  BUN 62* 58* 55* 54* 50*  CREATININE 2.98* 2.91* 2.78* 2.61* 2.56*  CALCIUM 7.8* 7.9* 8.2* 8.3* 8.7*  MG  --   --  2.6*  --   --     GFR: Estimated Creatinine Clearance: 24.2 mL/min (A) (by C-G formula based on SCr of 2.56 mg/dL (H)).  Liver Function Tests: Recent Labs  Lab 03/25/21 0217 03/26/21 0055   AST 34 27  ALT 272* 200*  ALKPHOS 45 39  BILITOT 1.1 1.0  PROT 5.6* 5.7*  ALBUMIN 1.9* 1.8*   No results for input(s): LIPASE, AMYLASE in the last 168 hours. No results for input(s): AMMONIA in the last 168 hours.  Coagulation Profile: No results for input(s): INR, PROTIME in the last 168 hours.  Cardiac Enzymes: No results for input(s): CKTOTAL, CKMB, CKMBINDEX, TROPONINI in the last 168 hours.  BNP (last 3 results) No results for input(s): PROBNP in the last 8760 hours.  Lipid Profile: No results for input(s): CHOL, HDL, LDLCALC, TRIG, CHOLHDL, LDLDIRECT in the last 72 hours.  Thyroid Function Tests: No results for input(s): TSH, T4TOTAL, FREET4, T3FREE, THYROIDAB in the last 72 hours.  Anemia Panel: No results for input(s): VITAMINB12, FOLATE, FERRITIN, TIBC, IRON, RETICCTPCT in the last 72 hours.  Urine analysis:    Component Value Date/Time   COLORURINE AMBER (A) 03/17/2021 1208   APPEARANCEUR CLOUDY (A) 03/17/2021 1208   LABSPEC 1.015 03/17/2021 1208   PHURINE 5.0  03/17/2021 1208   GLUCOSEU 50 (A) 03/17/2021 1208   HGBUR SMALL (A) 03/17/2021 1208   BILIRUBINUR NEGATIVE 03/17/2021 [place] 03/17/2021 1208   PROTEINUR >=300 (A) 03/17/2021 1208   UROBILINOGEN 0.2 01/17/2013 1910   NITRITE NEGATIVE 03/17/2021 1208   LEUKOCYTESUR LARGE (A) 03/17/2021 1208    Sepsis Labs: Lactic Acid, Venous    Component Value Date/Time   LATICACIDVEN 1.4 03/21/2021 0805    MICROBIOLOGY: No results found for this or any previous visit (from the past 240 hour(s)).  RADIOLOGY STUDIES/RESULTS: No results found.   LOS: 15 days   Oren Binet, MD  [facility]    To contact the attending provider between 7A-7P or the covering provider during after hours 7P-7A, please log into the web site [email or web address] and access using universal [place] password for that web site. If you do not have the password, please call the hospital  operator.  03/31/2021, 2:08 PM

## 2021-03-31 NOTE — TOC Progression Note (Signed)
Transition of Care [facility]) - Progression Note    Patient Details  Name: Eveny D Kronenberger MRN: [id number] Date of Birth: 12/13/1951  Transition of Care [facility]) CM/SW [place], LCSW Phone Number: 03/31/2021, 4:04 PM  Clinical Narrative:    CSW met with patient. She is in good spirits and reports being agreeable to SNF placement with hopes of discharging from SNF to home with the help of her sisters. CSW provided Medicare SNF ratings list; patient hopeful for somewhere near [place] where her son works or in [place] where her sisters are. CSW went over insurance information and need for prior authorization. CSW placed contact info on the SNF list for patient's son to follow up if he has questions.    Expected Discharge Plan: [place] Barriers to Discharge: Continued Medical Work up  Expected Discharge Plan and Services Expected Discharge Plan: [place] In-house Referral: Clinical Social Work     Living arrangements for the past 2 months: Single Family Home                                       Social Determinants of Health (SDOH) Interventions    Readmission Risk Interventions Readmission Risk Prevention Plan 03/19/2021 03/17/2021  Transportation Screening - Complete  PCP or Specialist Appt within 5-7 Days Complete -  Home Care Screening Complete -  Medication Review (RN CM) - Complete  Some recent data might be hidden

## 2021-03-31 NOTE — Progress Notes (Signed)
Referring Physician(s): Dr. Ferd Hibbs  Supervising Physician: Corrie Mckusick  Patient Status:  Tracey Bryant - In-pt  Chief Complaint:  Abdominal pain nausea, vomiting found to have acute calculus cholecystitis s/p cholecystomy tube placement on 3.24.22 by Dr. Serafina Royals  Subjective:  Patient states that she is feeling better today. She denies any abdominal pain nausea and vomiting. She states that she was able to participate in physical therapy today.   Allergies: Patient has no known allergies.  Medications: Prior to Admission medications   Medication Sig Start Date End Date Taking? Authorizing Provider  apixaban (ELIQUIS) 5 MG TABS tablet Take 5 mg by mouth 2 (two) times daily.   Yes [provider]  ezetimibe (ZETIA) 10 MG tablet Take 10 mg by mouth daily.   Yes [provider]  furosemide (LASIX) 20 MG tablet Take 20 mg by mouth.   Yes [provider]  glipiZIDE (GLUCOTROL XL) 5 MG 24 hr tablet Take 5 mg by mouth daily with breakfast.   Yes [provider]  insulin glargine (LANTUS) 100 UNIT/ML injection Inject 50 Units into the skin at bedtime.   Yes [provider]  insulin NPH-regular (NOVOLIN 70/30) (70-30) 100 UNIT/ML injection Inject 10 Units into the skin 3 (three) times daily after meals.   Yes [provider]     Vital Signs: BP (!) 124/57 (BP Location: Right Arm)   Pulse 67   Temp 98.2 F (36.8 C) (Oral)   Resp 19   Ht 5\' 6"  (1.676 m)   Wt 211 lb 12.8 oz (96.1 kg)   SpO2 92%   BMI 34.19 kg/m   Physical Exam Vitals and nursing note reviewed.  Constitutional:      Appearance: She is well-developed.  HENT:     Head: Normocephalic and atraumatic.  Eyes:     Conjunctiva/sclera: Conjunctivae normal.  Pulmonary:     Effort: Pulmonary effort is normal.  Chest:     Chest wall: Tenderness: .jodra.  Abdominal:     Comments: Positive RUQ drain to gravity bag. Site is unremarkable with no erythema, edema, tenderness,  bleeding or drainage noted at exit site. Suture and stat lock in place. Dressing is clean dry and intact. 50 ml of  Dark brown colored fluid noted in gravity bag.Drain is able to be flushed easily.   Musculoskeletal:        General: Normal range of motion.     Cervical back: Normal range of motion.  Skin:    General: Skin is warm.  Neurological:     Mental Status: She is alert and oriented to person, place, and time.     Imaging: DG Chest Port 1 View  Result Date: 03/27/2021 CLINICAL DATA:  Shortness of breath EXAM: PORTABLE CHEST 1 VIEW COMPARISON:  03/20/2021 FINDINGS: Left pacer remains in place, unchanged. Cardiomegaly. Small bilateral pleural effusions with bibasilar atelectasis. No overt edema. No acute bony abnormality. IMPRESSION: Small effusions with bibasilar atelectasis. Electronically Signed   By: Rolm Baptise M.D.   On: 03/27/2021 19:05   VAS [place] LOWER EXTREMITY VENOUS (DVT)  Result Date: 03/28/2021  Lower Venous DVT Study Indications: Swelling, history of CHF.  Limitations: Poor ultrasound/tissue interface and body habitus. Comparison Study: No prior studies. Performing Technologist: Darlin Coco RDMS,RVT  Examination Guidelines: A complete evaluation includes B-mode imaging, spectral Doppler, color Doppler, and power Doppler as needed of all accessible portions of each vessel. Bilateral testing is considered an integral part of a complete examination. Limited examinations for  reoccurring indications may be performed as noted. The reflux portion of the exam is performed with the patient in reverse Trendelenburg.  +---------+---------------+---------+-----------+----------+-------------------+ RIGHT    CompressibilityPhasicitySpontaneityPropertiesThrombus Aging      +---------+---------------+---------+-----------+----------+-------------------+ CFV      Full           Yes      Yes                                       +---------+---------------+---------+-----------+----------+-------------------+ SFJ      Full                                                             +---------+---------------+---------+-----------+----------+-------------------+ FV Prox  Full                                                             +---------+---------------+---------+-----------+----------+-------------------+ FV Mid   Full                                                             +---------+---------------+---------+-----------+----------+-------------------+ FV DistalFull                                                             +---------+---------------+---------+-----------+----------+-------------------+ PFV      Full                                                             +---------+---------------+---------+-----------+----------+-------------------+ POP      Full           Yes      Yes                                      +---------+---------------+---------+-----------+----------+-------------------+ PTV      Full                                         Some segments not                                                         well visualized     +---------+---------------+---------+-----------+----------+-------------------+  PERO     Full                                         Some segments not                                                         well visualized     +---------+---------------+---------+-----------+----------+-------------------+   +---------+---------------+---------+-----------+----------+-------------------+ LEFT     CompressibilityPhasicitySpontaneityPropertiesThrombus Aging      +---------+---------------+---------+-----------+----------+-------------------+ CFV      Full           Yes      Yes                                      +---------+---------------+---------+-----------+----------+-------------------+  SFJ      Full                                                             +---------+---------------+---------+-----------+----------+-------------------+ FV Prox  Full                                                             +---------+---------------+---------+-----------+----------+-------------------+ FV Mid   Full                                                             +---------+---------------+---------+-----------+----------+-------------------+ FV DistalFull                                                             +---------+---------------+---------+-----------+----------+-------------------+ PFV      Full                                                             +---------+---------------+---------+-----------+----------+-------------------+ POP      Partial        Yes      Yes                  Age Indeterminate   +---------+---------------+---------+-----------+----------+-------------------+ PTV      Full                                                             +---------+---------------+---------+-----------+----------+-------------------+  PERO     Full                                         Some segments not                                                         well visualized     +---------+---------------+---------+-----------+----------+-------------------+     Summary: RIGHT: - There is no evidence of deep vein thrombosis in the lower extremity. However, portions of this examination were limited- see technologist comments above.  - No cystic structure found in the popliteal fossa.  LEFT: - Findings consistent with age indeterminate deep vein thrombosis involving the left popliteal vein. - No cystic structure found in the popliteal fossa. - Ultrasound characteristics of enlarged lymph nodes noted in the groin.  *See table(s) above for measurements and observations. Electronically signed by Monica Martinez MD on  03/28/2021 at 11:14:58 AM.    Final     Labs:  CBC: Recent Labs    03/25/21 0217 03/27/21 0046 03/29/21 0208 03/30/21 0249  WBC 16.9* 15.6* 12.3* 13.1*  HGB 6.6* 7.1* 7.2* 7.5*  HCT 21.0* 22.9* 23.7* 25.6*  PLT 319 360 348 372    COAGS: Recent Labs    03/18/21 0630 03/19/21 0504 03/20/21 0147 03/21/21 0115  INR 1.6* 1.4* 1.6* 1.5*    BMP: Recent Labs    03/28/21 0209 03/29/21 0208 03/30/21 0249 03/31/21 0114  NA 134* 138 137 138  K 3.1* 3.8 3.9 4.6  CL 100 102 101 98  CO2 21* 27 27 30   GLUCOSE 130* 173* 136* 142*  BUN 58* 55* 54* 50*  CALCIUM 7.9* 8.2* 8.3* 8.7*  CREATININE 2.91* 2.78* 2.61* 2.56*  GFRNONAA 17* 18* 19* 20*    LIVER FUNCTION TESTS: Recent Labs    03/23/21 0231 03/24/21 0113 03/25/21 0217 03/26/21 0055  BILITOT 0.9 1.0 1.1 1.0  AST 106* 62* 34 27  ALT 537* 370* 272* 200*  ALKPHOS 53 47 45 39  PROT 5.6* 5.5* 5.6* 5.7*  ALBUMIN 2.0* 1.9* 1.9* 1.8*    Assessment and Plan:  70 y.o. female inpatient. History of DM, CHF s/p pacemaker.  Patient presented to the ED at [facility] with abdominal pain nausea and  Vomiting. Found to have acute cholecystitis. IR  placed a cholecystomy tube on 3.24.22 [facility] stay complicated by anemia and AKI. Per Epic output has been 25 ml, 330 ml, 190 ml. No growth found on cultures. WBC from 4.4.22 13.1 BUN 50 , Cr 2.56. Positive RUQ drain to gravity bag. Site is unremarkable with no erythema, edema, tenderness, bleeding or drainage noted at exit site. Suture and stat lock in place. Dressing is clean dry and intact. 50 ml of  Dark brown colored fluid noted in gravity bag. Drain is able to be flushed easily.  Cholecystomy tube drain will need to remain in place at least 4-6 weeks unless gallbaldder surgically removed in interim; continue tid drain flushes in house and once daily with 5 ml sterile normal saline as outpatient; will schedule f/u cholangiogram in 4-6 weeks. outpatient orders placed.    Electronically  Signed: Jacqualine Mau, NP 03/31/2021, 1:27 PM   I spent a total  of 15 Minutes at the patient's bedside AND on the patient's [facility] floor or unit, greater than 50% of which was counseling/coordinating care for cholecystomy tube placement

## 2021-03-31 NOTE — Progress Notes (Signed)
Occupational Therapy Treatment Patient Details Name: Tracey Bryant MRN: [id number] DOB: 04/20/1951 Today's Date: 03/31/2021    History of present illness 70 y.o. female with presented to [facility] ED on 3/21  with nausea/vomiting/abdominal pain-further evaluation revealed acute calculus cholecystitis. Transferred to [facility] 3/24 for percutaneous gallbladder drain placement  Hospital course complicated by AKI, multifactorial anemia, and volume overload PMH: past medical history of DM-2, HTN, A. fib, chronic systolic heart failure, s/p permanent pacemaker placement   OT comments  Pt progressing towards OT goals, remains pleasant and motivated to participate. Pt with decreased reports of dizziness with BP WFL throughout. Pt Min A x 2 to Mod A x 1 for sit to stand transfers progressing to require Mod A x 2 for safe pivots to [facility] and recliner due to B knee buckling. Pt Max A x 2 for toileting hygiene (one person for balance, one for hygiene). Noted per chart that CIR denied due to rehab intensity, so updated recommendations for short term rehab at [facility].    Follow Up Recommendations  SNF;Supervision/Assistance - 24 hour    Equipment Recommendations  None recommended by OT    Recommendations for Other Services      Precautions / Restrictions Precautions Precautions: Fall Precaution Comments: monitor BP, hemoglobin, B knee buckling with transfers Restrictions Weight Bearing Restrictions: No       Mobility Bed Mobility Overal bed mobility: Needs Assistance Bed Mobility: Supine to Sit     Supine to sit: Supervision;HOB elevated     General bed mobility comments: increased time with use of bedrail    Transfers Overall transfer level: Needs assistance Equipment used: Rolling walker (2 wheeled);2 person hand held assist Transfers: Sit to/from [facility];Lateral/Scoot Transfers Sit to Stand: Min assist;+2 physical assistance Stand pivot transfers: Mod assist;+2 physical  assistance;+2 safety/equipment       General transfer comment: Min A 1-2 for sit to stand with RW though with pivoting attempts, B knee buckling and LE shaking noted with return back to bed. Re-attempt bed to [facility] transfer with 2 person manual assist for knee blocking. Mod A for standing at [facility] with manual assist and a second person for hygiene and Mod A x 2 for stepping to recliner with assist to lock and advance B LE    Balance Overall balance assessment: Needs assistance Sitting-balance support: Feet supported;Bilateral upper extremity supported;No upper extremity supported;Single extremity supported Sitting balance-Leahy Scale: Fair     Standing balance support: Bilateral upper extremity supported;During functional activity Standing balance-Leahy Scale: Poor Standing balance comment: reliant on external support                           ADL either performed or assessed with clinical judgement   ADL Overall ADL's : Needs assistance/impaired                         Toilet Transfer: Moderate assistance;+2 for physical assistance;+2 for safety/equipment;Stand-pivot;BSC   Toileting- Clothing Manipulation and Hygiene: Maximal assistance;+2 for physical assistance;+2 for safety/equipment;Sit to/from stand         General ADL Comments: Improved BP and dizziness with movement, able to advance to [facility] transfers for toileting tasks. Noted balance deficits and B knee buckling increasing fall risk     Vision   Vision Assessment?: Vision impaired- to be further tested in functional context Additional Comments: reports legally blind   Perception     Praxis  Cognition Arousal/Alertness: Awake/alert Behavior During Therapy: WFL for tasks assessed/performed Overall Cognitive Status: Within Functional Limits for tasks assessed                                 General Comments: anxious about OOB tasks due to fear of syncope but motivated to improve         Exercises     Shoulder Instructions       General Comments VSS on RA, even BP    Pertinent Vitals/ Pain       Pain Assessment: No/denies pain  Home Living                                          Prior Functioning/Environment              Frequency  Min 2X/week        Progress Toward Goals  OT Goals(current goals can now be found in the care plan section)  Progress towards OT goals: Progressing toward goals  Acute Rehab OT Goals Patient Stated Goal: get up without passing out OT Goal Formulation: With patient Time For Goal Achievement: 04/09/21 Potential to Achieve Goals: Good ADL Goals Pt Will Perform Grooming: with set-up;standing Pt Will Perform Upper Body Dressing: with set-up;sitting Pt Will Perform Lower Body Dressing: with min guard assist;sitting/lateral leans;sit to/from stand;with adaptive equipment Pt Will Transfer to Toilet: with supervision;stand pivot transfer;grab bars Pt/caregiver will Perform Home Exercise Program: Both right and left upper extremity;Increased strength;With Supervision  Plan Discharge plan needs to be updated    Co-evaluation    PT/OT/SLP Co-Evaluation/Treatment: Yes Reason for Co-Treatment: For patient/therapist safety;To address functional/ADL transfers   OT goals addressed during session: ADL's and self-care      AM-PAC OT "6 Clicks" Daily Activity     Outcome Measure   Help from another person eating meals?: A Little Help from another person taking care of personal grooming?: A Little Help from another person toileting, which includes using toliet, bedpan, or urinal?: A Lot Help from another person bathing (including washing, rinsing, drying)?: A Lot Help from another person to put on and taking off regular upper body clothing?: A Little Help from another person to put on and taking off regular lower body clothing?: A Lot 6 Click Score: 15    End of Session Equipment Utilized During  Treatment: Rolling walker;Gait belt  OT Visit Diagnosis: Unsteadiness on feet (R26.81);Muscle weakness (generalized) (M62.81);Dizziness and giddiness (R42)   Activity Tolerance Patient tolerated treatment well   Patient Left in chair;with call bell/phone within reach;with chair alarm set   Nurse Communication Mobility status        Time: 5329-9242 OT Time Calculation (min): 50 min  Charges: OT General Charges $OT Visit: 1 Visit OT Treatments $Self Care/Home Management : 8-22 mins $Therapeutic Activity: 8-22 mins  Malachy Chamber, OTR/L Acute Rehab Services Office: [phone]   [name] 03/31/2021, 11:45 AM

## 2021-03-31 NOTE — Progress Notes (Signed)
Physical Therapy Treatment Patient Details Name: Tracey Bryant MRN: [id number] DOB: Mar 10, 1951 Today's Date: 03/31/2021    History of Present Illness 70 y.o. female with presented to [facility] ED on 3/21  with nausea/vomiting/abdominal pain-further evaluation revealed acute calculus cholecystitis. Transferred to [facility] 3/24 for percutaneous gallbladder drain placement  Hospital course complicated by AKI, multifactorial anemia, and volume overload PMH: past medical history of DM-2, HTN, A. fib, chronic systolic heart failure, s/p permanent pacemaker placement    PT Comments    Pt happy to see PT/OT and although she continues to be apprehensive about getting up agreeable to work with therapy. Pt continues to be limited in safe mobility by increased LE strength resulting in knee buckling with transfers and ambulation. Pt is supervision for bed mobility, min A for sit<>stand from bed and BSC, however modA for pivot transfers and short distance ambulation. Pt with increased time on BSC due to constipation, however happy to have BM. D/c plans remain appropriate at this time. PT will continue to follow acutely.     Follow Up Recommendations  SNF     Equipment Recommendations  None recommended by PT       Precautions / Restrictions Precautions Precautions: Fall Precaution Comments: monitor BP, hemoglobin, B knee buckling with transfers Restrictions Weight Bearing Restrictions: No    Mobility  Bed Mobility Overal bed mobility: Needs Assistance Bed Mobility: Supine to Sit     Supine to sit: Supervision;HOB elevated     General bed mobility comments: increased time with use of bedrail    Transfers Overall transfer level: Needs assistance Equipment used: Rolling walker (2 wheeled);2 person hand held assist Transfers: Sit to/from [facility];Lateral/Scoot Transfers Sit to Stand: Min assist;+2 physical assistance Stand pivot transfers: Mod assist;+2 physical assistance;+2  safety/equipment       General transfer comment: Min A 1-2 for sit to stand with RW though with pivoting attempts, B knee buckling and LE shaking noted with return back to bed. Re-attempt bed to [facility] transfer with 2 person manual assist for knee blocking. Mod A for standing at [facility] with manual assist and a second person for hygiene and Mod A x 2 for stepping to recliner with assist to lock and advance B LE  Ambulation/Gait Ambulation/Gait assistance: Mod assist;+2 physical assistance Gait Distance (Feet): 2 Feet Assistive device: 2 person hand held assist Gait Pattern/deviations: Step-to pattern;Shuffle Gait velocity: slowed Gait velocity interpretation: <1.31 ft/sec, indicative of household ambulator General Gait Details: modAx2 for stepping from [facility] to recliner, maximal verbal cuing for sequencing and therapist blocking and unblocking knees to progress gait         Balance Overall balance assessment: Needs assistance Sitting-balance support: Feet supported;Bilateral upper extremity supported;No upper extremity supported;Single extremity supported Sitting balance-Leahy Scale: Fair     Standing balance support: Bilateral upper extremity supported;During functional activity Standing balance-Leahy Scale: Poor Standing balance comment: reliant on external support                            Cognition Arousal/Alertness: Awake/alert Behavior During Therapy: WFL for tasks assessed/performed Overall Cognitive Status: Within Functional Limits for tasks assessed                                 General Comments: anxious about OOB tasks due to fear of syncope but motivated to improve  General Comments General comments (skin integrity, edema, etc.): VSS on RA      Pertinent Vitals/Pain Pain Assessment: No/denies pain           PT Goals (current goals can now be found in the care plan section) Acute Rehab PT Goals Patient Stated Goal: get up without  passing out PT Goal Formulation: With patient Time For Goal Achievement: 04/09/21 Potential to Achieve Goals: Good Progress towards PT goals: Progressing toward goals    Frequency    Min 3X/week      PT Plan Current plan remains appropriate    Co-evaluation PT/OT/SLP Co-Evaluation/Treatment: Yes Reason for Co-Treatment: For patient/therapist safety PT goals addressed during session: Mobility/safety with mobility OT goals addressed during session: ADL's and self-care      AM-PAC PT "6 Clicks" Mobility   Outcome Measure  Help needed turning from your back to your side while in a flat bed without using bedrails?: A Little Help needed moving from lying on your back to sitting on the side of a flat bed without using bedrails?: A Little Help needed moving to and from a bed to a chair (including a wheelchair)?: A Lot Help needed standing up from a chair using your arms (e.g., wheelchair or bedside chair)?: A Lot Help needed to walk in hospital room?: A Lot Help needed climbing 3-5 steps with a railing? : Total 6 Click Score: 13    End of Session Equipment Utilized During Treatment: Gait belt Activity Tolerance: Patient tolerated treatment well Patient left: with call bell/phone within reach;in chair;with chair alarm set;with nursing/sitter in room Nurse Communication: Mobility status;Other (comment) (no orthostatic hypotension) PT Visit Diagnosis: Unsteadiness on feet (R26.81);Other abnormalities of gait and mobility (R26.89);Muscle weakness (generalized) (M62.81)     Time: 9562-1308 PT Time Calculation (min) (ACUTE ONLY): 50 min  Charges:  $Therapeutic Activity: 8-22 mins                     Yona Kosek B. Migdalia Dk PT, DPT Acute Rehabilitation Services Pager [phone] Office [phone]    Kaycee 03/31/2021, 1:32 PM

## 2021-04-01 DIAGNOSIS — N390 Urinary tract infection, site not specified: Secondary | ICD-10-CM | POA: Diagnosis not present

## 2021-04-01 DIAGNOSIS — E0810 Diabetes mellitus due to underlying condition with ketoacidosis without coma: Secondary | ICD-10-CM | POA: Diagnosis not present

## 2021-04-01 DIAGNOSIS — K810 Acute cholecystitis: Secondary | ICD-10-CM | POA: Diagnosis not present

## 2021-04-01 DIAGNOSIS — N179 Acute kidney failure, unspecified: Secondary | ICD-10-CM | POA: Diagnosis not present

## 2021-04-01 LAB — CBC
HCT: 27.8 % — ABNORMAL LOW (ref 36.0–46.0)
Hemoglobin: 8.4 g/dL — ABNORMAL LOW (ref 12.0–15.0)
MCH: 29.9 pg (ref 26.0–34.0)
MCHC: 30.2 g/dL (ref 30.0–36.0)
MCV: 98.9 fL (ref 80.0–100.0)
Platelets: 433 10*3/uL — ABNORMAL HIGH (ref 150–400)
RBC: 2.81 MIL/uL — ABNORMAL LOW (ref 3.87–5.11)
RDW: 20.5 % — ABNORMAL HIGH (ref 11.5–15.5)
WBC: 9.9 10*3/uL (ref 4.0–10.5)
nRBC: 0 % (ref 0.0–0.2)

## 2021-04-01 LAB — BASIC METABOLIC PANEL
Anion gap: 11 (ref 5–15)
BUN: 45 mg/dL — ABNORMAL HIGH (ref 8–23)
CO2: 33 mmol/L — ABNORMAL HIGH (ref 22–32)
Calcium: 8.9 mg/dL (ref 8.9–10.3)
Chloride: 94 mmol/L — ABNORMAL LOW (ref 98–111)
Creatinine, Ser: 2.4 mg/dL — ABNORMAL HIGH (ref 0.44–1.00)
GFR, Estimated: 21 mL/min — ABNORMAL LOW (ref >60)
Glucose, Bld: 196 mg/dL — ABNORMAL HIGH (ref 70–99)
Potassium: 4.3 mmol/L (ref 3.5–5.1)
Sodium: 138 mmol/L (ref 135–145)

## 2021-04-01 LAB — GLUCOSE, CAPILLARY
Glucose-Capillary: 109 mg/dL — ABNORMAL HIGH (ref 70–99)
Glucose-Capillary: 145 mg/dL — ABNORMAL HIGH (ref 70–99)
Glucose-Capillary: 123 mg/dL — ABNORMAL HIGH (ref 70–99)
Glucose-Capillary: 117 mg/dL — ABNORMAL HIGH (ref 70–99)

## 2021-04-01 MED ORDER — APIXABAN 5 MG PO TABS
5.0000 mg | ORAL_TABLET | Freq: Two times a day (BID) | ORAL | Status: DC
  Administered 2021-04-02 – 2021-04-08 (×13): 5 mg via ORAL
  Filled 2021-04-01 (×13): qty 1

## 2021-04-01 NOTE — TOC Progression Note (Addendum)
Transition of Care [facility]) - Progression Note    Patient Details  Name: Tracey Bryant MRN: [id number] Date of Birth: [birth year]  Transition of Care [facility]) CM/SW [place], LCSW Phone Number: 04/01/2021, 12:07 PM  Clinical Narrative:    11am-CSW spoke with patient's son, [name], regarding SNF bed offers. He and patient have heard good things about [facility] in [place] and would like them. CSW left voicemail for [facility].  12pm-CSW received call back from [facility]. They are seeing if they have any beds available for the end of the week when patient will be ready and will call CSW back. Alternatively, [facility] can accept patient.     Expected Discharge Plan: [place] Barriers to Discharge: Insurance Authorization,Continued Medical Work up  Expected Discharge Plan and Services Expected Discharge Plan: [place] In-house Referral: Clinical Social Work   Post Acute Care Choice: [place] Living arrangements for the past 2 months: Single Family Home                                       Social Determinants of Health (SDOH) Interventions    Readmission Risk Interventions Readmission Risk Prevention Plan 03/19/2021 03/17/2021  Transportation Screening - Complete  PCP or Specialist Appt within 5-7 Days Complete -  Home Care Screening Complete -  Medication Review (RN CM) - Complete  Some recent data might be hidden

## 2021-04-01 NOTE — Progress Notes (Signed)
Nutrition Follow-up  DOCUMENTATION CODES:   Obesity unspecified  INTERVENTION:    Continue sugar free Carnation breakfast essentials mixed with 2% milk TID, each supplement provides 260 kcal, 13 gm protein.  NUTRITION DIAGNOSIS:   Inadequate oral intake related to inability to eat,lethargy/confusion,decreased appetite as evidenced by meal completion < 50%,per patient/family report.  Ongoing   GOAL:   Patient will meet greater than or equal to 90% of their needs  Progressing   MONITOR:   PO intake,Supplement acceptance,Labs,Diet advancement  REASON FOR ASSESSMENT:   Malnutrition Screening Tool    ASSESSMENT:   70 yo female with a PMH of T2DM, cardiomyopathy, HTN, and stroke presents with DKA and AKI after 2-day hx of N/V and generalized weakness.  Patient remains on on a heart healthy carbohydrate modified diet. Meal intakes: 50-90%, average 60%  Patient does not like the Ensure and Nepro supplements. She prefers [employer] and drinks it TID mixed with 2% milk.  Labs reviewed.  CBG: 109-145  Medications reviewed and include Aranesp, Lasix, Novolog, Lantus, MVI with minerals, KCl, sodium bicarb.  Weight trending down over the past week d/t decreased swelling.  Diet Order:   Diet Order            Diet heart healthy/carb modified Room service appropriate? Yes; Fluid consistency: Thin; Fluid restriction: 1500 mL Fluid  Diet effective now                 EDUCATION NEEDS:   Not appropriate for education at this time  Skin:  Skin Assessment: Reviewed RN Assessment  Last BM:  4/6  Height:   Ht Readings from Last 1 Encounters:  03/16/21 5\' 6"  (1.676 m)    Weight:   Wt Readings from Last 1 Encounters:  04/01/21 89.5 kg    Ideal Body Weight:  59.1 kg  BMI:  Body mass index is 31.85 kg/m.  Estimated Nutritional Needs:   Kcal:  1600-1800  Protein:  75-90 grams  Fluid:  >1.6 L    Lucas Mallow, RD, LDN, CNSC Please refer  to Amion for contact information.

## 2021-04-01 NOTE — Progress Notes (Signed)
PROGRESS NOTE        PATIENT DETAILS Name: Tracey Bryant Age: 70 y.o. Sex: female Date of Birth: 10-02-1951 Admit Date: 03/16/2021 Admitting Physician Deatra James, MD FOY:DXAJOIN, Arlyn Leak, FNP  Brief Narrative: Patient is a 70 y.o. female with past medical history of DM-2, HTN, A. fib, chronic systolic heart failure, s/p permanent pacemaker placement-presented to the ED on 3/21  with nausea/vomiting/abdominal pain-further evaluation revealed acute calculus cholecystitis.  Hospital course complicated by AKI, multifactorial anemia, volume overload, age indeterminate left DVT.  See below for further details  Significant events: 3/20>> ED visit for epistaxis 3/21>> admit to [facility] for acute calculus cholecystitis 3/24>> transfer to [facility] for percutaneous gallbladder drain placement  Significant studies: 3/22 >> CT abdomen/pelvis: Cholelithiasis, bilateral small pleural effusions, ascites with anasarca 3/23>> RUQ ultrasound: Acute calculus cholecystitis 3/23>> Echo: EF 25-30%, decreased RV systolic function 8/67>> mild central pulmonary vascular congestion. 4/2>> lower extremity Doppler: Age indeterminate DVT left popliteal vein.  Antimicrobial therapy: Zosyn: 3/22>>3/31  Microbiology data: 3/22>> urine culture: E. Coli 3/22>> blood culture: No growth 3/24>> gallbladder/bile culture: No growth  Procedures : 3/24>> CT-guided cholecystostomy tube placement  Consults: IR, general surgery, nephrology, gastroenterology  DVT Prophylaxis : Eliquis  Subjective: No major issues overnight-acknowledges improvement in swelling of her legs-and less shortness of breath when sitting up and moving around.  Assessment/Plan: Severe sepsis due to acute calculus cholecystitis and UTI: Sepsis physiology has resolved-culture data as above- completed 10 days of Zosyn on 3/31.   Acute calculus cholecystitis:evaluated by general surgery at [facility]-not a surgical  candidate-hence percutaneous cholecystostomy tube placed.  IR following.  Transaminitis: Multifactorial-probably due to sepsis/acute cholecystitis-downtrending-continue to follow periodically.  Acute on chronic systolic/diastolic heart failure: Volume status continues to gradually improve-still volume overloaded-continue IV Lasix.  Continue to follow weights/electrolytes/intake/output.    AKI on CKD stage IIIa: AKI likely hemodynamically mediated-gradually improving-creatinine downtrending but still not yet at baseline.  Remains grossly volume overloaded-on diuretics as noted above.   Syncope: Occurred on 4/3 while working with physical therapy-she had just gotten out of bed (after several days)-high suspicion that this was due to orthostatic mechanism.  Telemetry unremarkable during this episode.  Acute urinary retention: Foley catheter placed on 3/26-started Flomax on 3/30-successful voiding trial on 4/2-no longer with Foley catheter.  No longer on Flomax.  Hyponatremia: Due to hypervolemia-resolved with diuretics.  Anemia: Multifactorial-some component of acute blood loss due to epistaxis that occurred 1 day prior to this hospitalization-on top of anemia due to chronic disease/critical illness.  Also FOBT positive-no overt GI blood loss.  Is a [name] continues to refuse blood transfusion-she confirms that she will refuse blood-even in the face of life-threatening situation.  Hemoglobin levels are slowly improving with IV iron and Aranesp.  Continue to monitor CBC periodically.   Left lower leg DVT: Age indeterminate-but suspect must have occurred this admission as she is maintained on Eliquis-unfortunately Eliquis was discontinued as patient had epistaxis-and given the severity of anemia.  After extensive discussion with patient/son-regarding risk/benefits-IV heparin was started on 4/2 and subsequently transitioned to SQ Lovenox on 4/4.  Since hemoglobin continues to stabilize-we  will transition to Eliquis today.    PAF: Appears to be in sinus rhythm-Eliquis initially on hold due to severity of anemia and recent history of epistaxis-however now due to diagnosis of DVT-on therapeutic anticoagulation.  History of  CVA: Nonfocal exam-difficult situation-Eliquis resumed.  HLD: On Zetia  DM-2: CBG stable-continue Lantus 20 units daily and SSI.  Recent Labs    03/31/21 2038 04/01/21 0746 04/01/21 1132  GLUCAP 156* 109* 145*    Obesity: Estimated body mass index is 31.85 kg/m as calculated from the following:   Height as of this encounter: 5\' 6"  (1.676 m).   Weight as of this encounter: 89.5 kg.   Debility/deconditioning: Due to acute illness-PT/OT recommending CIR-however probably will require SNF per notes from CIR. spoke at length with patient's son yesterday-I urged him to speak with our social work team so that we can start the process of insurance authorization etc. for eventual SNF placement when ready for discharge.  Diet: Diet Order            Diet heart healthy/carb modified Room service appropriate? Yes; Fluid consistency: Thin; Fluid restriction: 1500 mL Fluid  Diet effective now                  Code Status: Full code  Family Communication: Son-Kevin-[phone]-called on 4/5-unable to leave voicemail.  Disposition Plan: Status is: Inpatient  Remains inpatient appropriate because:Inpatient level of care appropriate due to severity of illness   Dispo: The patient is from: Home              Anticipated d/c is to: SNF              Patient currently is not medically stable to d/c.   Difficult to place patient No    Barriers to Discharge: Volume overloaded remains on IV Lasix but rapidly improving-expect patient to be ready for discharge in the next few days.  Social worker following for SNF placement.  Antimicrobial agents: Anti-infectives (From admission, onward)   Start     Dose/Rate Route Frequency Ordered Stop   03/26/21 2200   piperacillin-tazobactam (ZOSYN) IVPB 3.375 g        3.375 g 12.5 mL/hr over 240 Minutes Intravenous Every 12 hours 03/26/21 1137 03/27/21 0158   03/18/21 2200  piperacillin-tazobactam (ZOSYN) IVPB 3.375 g  Status:  Discontinued        3.375 g 12.5 mL/hr over 240 Minutes Intravenous Every 12 hours 03/18/21 1201 03/26/21 1137   03/17/21 1400  piperacillin-tazobactam (ZOSYN) IVPB 3.375 g  Status:  Discontinued        3.375 g 12.5 mL/hr over 240 Minutes Intravenous Every 8 hours 03/17/21 1246 03/18/21 1201       Time spent: 25- minutes-Greater than 50% of this time was spent in counseling, explanation of diagnosis, planning of further management, and coordination of care.  MEDICATIONS: Scheduled Meds: . [START ON 04/02/2021] apixaban  5 mg Oral BID  . Chlorhexidine Gluconate Cloth  6 each Topical Daily  . darbepoetin (ARANESP) injection - NON-DIALYSIS  60 mcg Subcutaneous Q Sat-1800  . ezetimibe  10 mg Oral Daily  . furosemide  80 mg Intravenous Q12H  . insulin aspart  0-15 Units Subcutaneous TID WC  . insulin aspart  0-5 Units Subcutaneous QHS  . insulin glargine  20 Units Subcutaneous Daily  . multivitamin with minerals  1 tablet Oral Daily  . potassium chloride  40 mEq Oral Daily  . sodium bicarbonate  1,300 mg Oral TID  . sodium chloride flush  3 mL Intravenous Q12H  . sodium chloride flush  5 mL Intracatheter Q8H   Continuous Infusions:  PRN Meds:.bisacodyl, dextrose, HYDROmorphone (DILAUDID) injection, ipratropium, levalbuterol, ondansetron **OR** ondansetron (ZOFRAN) IV, oxyCODONE, senna-docusate, traZODone  PHYSICAL EXAM: Vital signs: Vitals:   03/31/21 2351 04/01/21 0357 04/01/21 1000 04/01/21 1132  BP: (!) 138/59 (!) 141/69  132/63  Pulse: 75 75  69  Resp: 18 18  18   Temp: 98 F (36.7 C) 98 F (36.7 C)  98.7 F (37.1 C)  TempSrc: Oral Oral  Axillary  SpO2: 95% 99% 99% 96%  Weight:  89.5 kg    Height:       Filed Weights   03/29/21 0500 03/31/21 0731 04/01/21  0357  Weight: 93.8 kg 96.1 kg 89.5 kg   Body mass index is 31.85 kg/m.   Gen Exam:Alert awake-not in any distress HEENT:atraumatic, normocephalic Chest: B/L clear to auscultation anteriorly CVS:S1S2 regular Abdomen:soft non tender, non distended Extremities:++ edema Neurology: Non focal Skin: no rash  I have personally reviewed following labs and imaging studies  LABORATORY DATA: CBC: Recent Labs  Lab 03/27/21 0046 03/29/21 0208 03/30/21 0249 04/01/21 0049  WBC 15.6* 12.3* 13.1* 9.9  HGB 7.1* 7.2* 7.5* 8.4*  HCT 22.9* 23.7* 25.6* 27.8*  MCV 97.0 98.3 99.6 98.9  PLT 360 348 372 433*    Basic Metabolic Panel: Recent Labs  Lab 03/28/21 0209 03/29/21 0208 03/30/21 0249 03/31/21 0114 04/01/21 0049  NA 134* 138 137 138 138  K 3.1* 3.8 3.9 4.6 4.3  CL 100 102 101 98 94*  CO2 21* 27 27 30  33*  GLUCOSE 130* 173* 136* 142* 196*  BUN 58* 55* 54* 50* 45*  CREATININE 2.91* 2.78* 2.61* 2.56* 2.40*  CALCIUM 7.9* 8.2* 8.3* 8.7* 8.9  MG  --  2.6*  --   --   --     GFR: Estimated Creatinine Clearance: 24.9 mL/min (A) (by C-G formula based on SCr of 2.4 mg/dL (H)).  Liver Function Tests: Recent Labs  Lab 03/26/21 0055  AST 27  ALT 200*  ALKPHOS 39  BILITOT 1.0  PROT 5.7*  ALBUMIN 1.8*   No results for input(s): LIPASE, AMYLASE in the last 168 hours. No results for input(s): AMMONIA in the last 168 hours.  Coagulation Profile: No results for input(s): INR, PROTIME in the last 168 hours.  Cardiac Enzymes: No results for input(s): CKTOTAL, CKMB, CKMBINDEX, TROPONINI in the last 168 hours.  BNP (last 3 results) No results for input(s): PROBNP in the last 8760 hours.  Lipid Profile: No results for input(s): CHOL, HDL, LDLCALC, TRIG, CHOLHDL, LDLDIRECT in the last 72 hours.  Thyroid Function Tests: No results for input(s): TSH, T4TOTAL, FREET4, T3FREE, THYROIDAB in the last 72 hours.  Anemia Panel: No results for input(s): VITAMINB12, FOLATE, FERRITIN, TIBC,  IRON, RETICCTPCT in the last 72 hours.  Urine analysis:    Component Value Date/Time   COLORURINE AMBER (A) 03/17/2021 1208   APPEARANCEUR CLOUDY (A) 03/17/2021 1208   LABSPEC 1.015 03/17/2021 1208   PHURINE 5.0 03/17/2021 1208   GLUCOSEU 50 (A) 03/17/2021 1208   HGBUR SMALL (A) 03/17/2021 1208   BILIRUBINUR NEGATIVE 03/17/2021 1208   KETONESUR NEGATIVE 03/17/2021 1208   PROTEINUR >=300 (A) 03/17/2021 1208   UROBILINOGEN 0.2 01/17/2013 1910   NITRITE NEGATIVE 03/17/2021 1208   LEUKOCYTESUR LARGE (A) 03/17/2021 1208    Sepsis Labs: Lactic Acid, Venous    Component Value Date/Time   LATICACIDVEN 1.4 03/21/2021 0805    MICROBIOLOGY: No results found for this or any previous visit (from the past 240 hour(s)).  RADIOLOGY STUDIES/RESULTS: No results found.   LOS: 16 days   Oren Binet, MD  [facility]  To contact the attending provider between 7A-7P or the covering provider during after hours 7P-7A, please log into the web site [email or web address] and access using universal [place] password for that web site. If you do not have the password, please call the hospital operator.  04/01/2021, 1:40 PM

## 2021-04-02 DIAGNOSIS — K810 Acute cholecystitis: Secondary | ICD-10-CM | POA: Diagnosis not present

## 2021-04-02 DIAGNOSIS — N179 Acute kidney failure, unspecified: Secondary | ICD-10-CM | POA: Diagnosis not present

## 2021-04-02 DIAGNOSIS — N390 Urinary tract infection, site not specified: Secondary | ICD-10-CM | POA: Diagnosis not present

## 2021-04-02 DIAGNOSIS — E0810 Diabetes mellitus due to underlying condition with ketoacidosis without coma: Secondary | ICD-10-CM | POA: Diagnosis not present

## 2021-04-02 LAB — GLUCOSE, CAPILLARY
Glucose-Capillary: 82 mg/dL (ref 70–99)
Glucose-Capillary: 131 mg/dL — ABNORMAL HIGH (ref 70–99)
Glucose-Capillary: 182 mg/dL — ABNORMAL HIGH (ref 70–99)
Glucose-Capillary: 168 mg/dL — ABNORMAL HIGH (ref 70–99)

## 2021-04-02 LAB — BASIC METABOLIC PANEL
Anion gap: 10 (ref 5–15)
BUN: 42 mg/dL — ABNORMAL HIGH (ref 8–23)
CO2: 37 mmol/L — ABNORMAL HIGH (ref 22–32)
Calcium: 9.4 mg/dL (ref 8.9–10.3)
Chloride: 93 mmol/L — ABNORMAL LOW (ref 98–111)
Creatinine, Ser: 2.44 mg/dL — ABNORMAL HIGH (ref 0.44–1.00)
GFR, Estimated: 21 mL/min — ABNORMAL LOW (ref >60)
Glucose, Bld: 71 mg/dL (ref 70–99)
Potassium: 4.6 mmol/L (ref 3.5–5.1)
Sodium: 140 mmol/L (ref 135–145)

## 2021-04-02 LAB — TROPONIN I (HIGH SENSITIVITY)
Troponin I (High Sensitivity): 88 ng/L — ABNORMAL HIGH (ref <18)
Troponin I (High Sensitivity): 87 ng/L — ABNORMAL HIGH (ref <18)

## 2021-04-02 MED ORDER — SODIUM CHLORIDE 0.9 % IV BOLUS
250.0000 mL | INTRAVENOUS | Status: AC
  Administered 2021-04-02: 250 mL via INTRAVENOUS

## 2021-04-02 MED ORDER — TORSEMIDE 20 MG PO TABS: 40.0000 mg | ORAL_TABLET | Freq: Every day | ORAL | Status: DC

## 2021-04-02 MED ORDER — TORSEMIDE 20 MG PO TABS
40.0000 mg | ORAL_TABLET | Freq: Two times a day (BID) | ORAL | Status: DC
  Administered 2021-04-02: 40 mg via ORAL
  Filled 2021-04-02: qty 2

## 2021-04-02 MED ORDER — TORSEMIDE 20 MG PO TABS: 60.0000 mg | ORAL_TABLET | Freq: Two times a day (BID) | ORAL | Status: DC

## 2021-04-02 NOTE — Progress Notes (Addendum)
PROGRESS NOTE        PATIENT DETAILS Name: Tracey Bryant Age: 70 y.o. Sex: female Date of Birth: Aug 26, 1951 Admit Date: 03/16/2021 Admitting Physician Deatra James, MD IEP:PIRJJOA, Arlyn Leak, FNP  Brief Narrative: Patient is a 70 y.o. female with past medical history of DM-2, HTN, A. fib, chronic systolic heart failure, s/p permanent pacemaker placement-presented to the ED on 3/21  with nausea/vomiting/abdominal pain-further evaluation revealed acute calculus cholecystitis.  Hospital course complicated by AKI, multifactorial anemia, volume overload, age indeterminate left DVT.  See below for further details  Significant events: 3/20>> ED visit for epistaxis 3/21>> admit to [facility] for acute calculus cholecystitis 3/24>> transfer to [facility] for percutaneous gallbladder drain placement  Significant studies: 3/22 >> CT abdomen/pelvis: Cholelithiasis, bilateral small pleural effusions, ascites with anasarca 3/23>> RUQ ultrasound: Acute calculus cholecystitis 3/23>> Echo: EF 25-30%, decreased RV systolic function 4/16>> mild central pulmonary vascular congestion. 4/2>> lower extremity Doppler: Age indeterminate DVT left popliteal vein.  Antimicrobial therapy: Zosyn: 3/22>>3/31  Microbiology data: 3/22>> urine culture: E. Coli 3/22>> blood culture: No growth 3/24>> gallbladder/bile culture: No growth  Procedures : 3/24>> CT-guided cholecystostomy tube placement  Consults: IR, general surgery, nephrology, gastroenterology  DVT Prophylaxis : Eliquis  Subjective: Seen earlier this morning-feels great.  However syncopized with PT x2-first time she was standing up-however syncopized again when she was in bed.  Telemetry was negative.  Assessment/Plan: Severe sepsis due to acute calculus cholecystitis and UTI: Sepsis physiology has resolved-culture data as above- completed 10 days of Zosyn on 3/31.   Acute calculus cholecystitis:evaluated by general surgery  at [facility]-not a surgical candidate-hence percutaneous cholecystostomy tube placed.  IR following.  Transaminitis: Multifactorial-probably due to sepsis/acute cholecystitis-downtrending-continue to follow periodically.  Acute on chronic systolic/diastolic heart failure: Volume status has improved significantly, she is now -17 L, will switch to Demadex.  AKI on CKD stage IIIa: AKI likely hemodynamically mediated-gradually improving-creatinine downtrending but has somewhat plateaued.  Volume status is markedly improved over the past few days.  Syncope: Occurred on 4/3 while working with physical therapy-and again on 4/7 x 2-(initial episode when standing up with PT-second episode while in bed).  Telemetry negative for arrhythmias.  Orthostatic vital signs when sitting up are negative-however she does get lightheaded when she sits up.  We will give her 250 cc of IVF.  Hold further diuretics today.  Have spoken with cardiology navigated to see if we can get her pacemaker interrogated.  I suspect this is still related to orthostatic mechanism-continue to monitor closely-keep on telemetry-have asked nurse to place thigh-high teds hose  Addendum 4:07 PM: Informed by cardiology Navigator-PPM interrogated-no pauses/arrhythmias seen.  Will continue to monitor closely-as noted above-high suspicion that syncope is related to orthostatic mechanisms.  Acute urinary retention: Foley catheter placed on 3/26-started Flomax on 3/30-successful voiding trial on 4/2-no longer with Foley catheter.  No longer on Flomax.  Hyponatremia: Due to hypervolemia-resolved with diuretics.  Anemia: Multifactorial-some component of acute blood loss due to epistaxis that occurred 1 day prior to this hospitalization-on top of anemia due to chronic disease/critical illness.  Also FOBT positive-no overt GI blood loss.  Is a [name] continues to refuse blood transfusion-she confirms that she will refuse blood-even in the face of  life-threatening situation.  Hemoglobin levels are slowly improving with IV iron and Aranesp.  Continue to monitor CBC periodically.  Left lower leg DVT: Age indeterminate-but suspect must have occurred this admission as she is maintained on Eliquis-unfortunately Eliquis was discontinued as patient had epistaxis-and given the severity of anemia.  After extensive discussion with patient/son-regarding risk/benefits-IV heparin was started on 4/2 and subsequently transitioned to SQ Lovenox on 4/4.  Since hemoglobin has stabilized-has been transitioned to Eliquis.   PAF: Appears to be in sinus rhythm-Eliquis initially on hold due to severity of anemia and recent history of epistaxis-however now due to diagnosis of DVT-on therapeutic anticoagulation.  History of CVA: Nonfocal exam-difficult situation-Eliquis resumed.  HLD: On Zetia  DM-2: CBG stable-continue Lantus 20 units daily and SSI.  Recent Labs    04/01/21 2043 04/02/21 0756 04/02/21 1140  GLUCAP 117* 82 131*    Obesity: Estimated body mass index is 31.92 kg/m as calculated from the following:   Height as of this encounter: 5\' 6"  (1.676 m).   Weight as of this encounter: 89.7 kg.   Debility/deconditioning: Due to acute illness-PT/OT recommending CIR-however probably will require SNF per notes from CIR. spoke at length with patient's son yesterday-I urged him to speak with our social work team so that we can start the process of insurance authorization etc. for eventual SNF placement when ready for discharge.  Diet: Diet Order            Diet heart healthy/carb modified Room service appropriate? Yes; Fluid consistency: Thin; Fluid restriction: 1500 mL Fluid  Diet effective now                  Code Status: Full code  Family Communication: [phone] with son on 4/7  Disposition Plan: Status is: Inpatient  Remains inpatient appropriate because:Inpatient level of care appropriate due to severity of  illness   Dispo: The patient is from: Home              Anticipated d/c is to: SNF              Patient currently is not medically stable to d/c.   Difficult to place patient No    Barriers to Discharge: Volume overloaded remains on IV Lasix but rapidly improving-expect patient to be ready for discharge in the next few days.  Social worker following for SNF placement.  Antimicrobial agents: Anti-infectives (From admission, onward)   Start     Dose/Rate Route Frequency Ordered Stop   03/26/21 2200  piperacillin-tazobactam (ZOSYN) IVPB 3.375 g        3.375 g 12.5 mL/hr over 240 Minutes Intravenous Every 12 hours 03/26/21 1137 03/27/21 0158   03/18/21 2200  piperacillin-tazobactam (ZOSYN) IVPB 3.375 g  Status:  Discontinued        3.375 g 12.5 mL/hr over 240 Minutes Intravenous Every 12 hours 03/18/21 1201 03/26/21 1137   03/17/21 1400  piperacillin-tazobactam (ZOSYN) IVPB 3.375 g  Status:  Discontinued        3.375 g 12.5 mL/hr over 240 Minutes Intravenous Every 8 hours 03/17/21 1246 03/18/21 1201       Time spent: 25- minutes-Greater than 50% of this time was spent in counseling, explanation of diagnosis, planning of further management, and coordination of care.  MEDICATIONS: Scheduled Meds: . apixaban  5 mg Oral BID  . Chlorhexidine Gluconate Cloth  6 each Topical Daily  . darbepoetin (ARANESP) injection - NON-DIALYSIS  60 mcg Subcutaneous Q Sat-1800  . ezetimibe  10 mg Oral Daily  . insulin aspart  0-15 Units Subcutaneous TID WC  . insulin aspart  0-5 Units  Subcutaneous QHS  . insulin glargine  20 Units Subcutaneous Daily  . multivitamin with minerals  1 tablet Oral Daily  . potassium chloride  40 mEq Oral Daily  . sodium bicarbonate  1,300 mg Oral TID  . sodium chloride flush  3 mL Intravenous Q12H  . sodium chloride flush  5 mL Intracatheter Q8H  . [START ON 04/03/2021] torsemide  40 mg Oral Daily   Continuous Infusions: . sodium chloride     PRN Meds:.bisacodyl,  dextrose, HYDROmorphone (DILAUDID) injection, ipratropium, levalbuterol, ondansetron **OR** ondansetron (ZOFRAN) IV, oxyCODONE, senna-docusate, traZODone   PHYSICAL EXAM: Vital signs: Vitals:   04/02/21 0400 04/02/21 0404 04/02/21 0750 04/02/21 1142  BP:  (!) 141/64 132/63 (!) 149/73  Pulse:  72 65 72  Resp:  18 18 18   Temp:  98.1 F (36.7 C) 97.9 F (36.6 C) 98 F (36.7 C)  TempSrc:  Axillary Axillary Oral  SpO2:  99% 97% 100%  Weight: 89.7 kg     Height:       Filed Weights   03/31/21 0731 04/01/21 0357 04/02/21 0400  Weight: 96.1 kg 89.5 kg 89.7 kg   Body mass index is 31.92 kg/m.   Gen Exam:Alert awake-not in any distress HEENT:atraumatic, normocephalic Chest: B/L clear to auscultation anteriorly CVS:S1S2 regular Abdomen:soft non tender, non distended Extremities:+ edema Neurology: Non focal Skin: no rash  I have personally reviewed following labs and imaging studies  LABORATORY DATA: CBC: Recent Labs  Lab 03/27/21 0046 03/29/21 0208 03/30/21 0249 04/01/21 0049  WBC 15.6* 12.3* 13.1* 9.9  HGB 7.1* 7.2* 7.5* 8.4*  HCT 22.9* 23.7* 25.6* 27.8*  MCV 97.0 98.3 99.6 98.9  PLT 360 348 372 433*    Basic Metabolic Panel: Recent Labs  Lab 03/29/21 0208 03/30/21 0249 03/31/21 0114 04/01/21 0049 04/02/21 0340  NA 138 137 138 138 140  K 3.8 3.9 4.6 4.3 4.6  CL 102 101 98 94* 93*  CO2 27 27 30  33* 37*  GLUCOSE 173* 136* 142* 196* 71  BUN 55* 54* 50* 45* 42*  CREATININE 2.78* 2.61* 2.56* 2.40* 2.44*  CALCIUM 8.2* 8.3* 8.7* 8.9 9.4  MG 2.6*  --   --   --   --     GFR: Estimated Creatinine Clearance: 24.6 mL/min (A) (by C-G formula based on SCr of 2.44 mg/dL (H)).  Liver Function Tests: No results for input(s): AST, ALT, ALKPHOS, BILITOT, PROT, ALBUMIN in the last 168 hours. No results for input(s): LIPASE, AMYLASE in the last 168 hours. No results for input(s): AMMONIA in the last 168 hours.  Coagulation Profile: No results for input(s): INR,  PROTIME in the last 168 hours.  Cardiac Enzymes: No results for input(s): CKTOTAL, CKMB, CKMBINDEX, TROPONINI in the last 168 hours.  BNP (last 3 results) No results for input(s): PROBNP in the last 8760 hours.  Lipid Profile: No results for input(s): CHOL, HDL, LDLCALC, TRIG, CHOLHDL, LDLDIRECT in the last 72 hours.  Thyroid Function Tests: No results for input(s): TSH, T4TOTAL, FREET4, T3FREE, THYROIDAB in the last 72 hours.  Anemia Panel: No results for input(s): VITAMINB12, FOLATE, FERRITIN, TIBC, IRON, RETICCTPCT in the last 72 hours.  Urine analysis:    Component Value Date/Time   COLORURINE AMBER (A) 03/17/2021 1208   APPEARANCEUR CLOUDY (A) 03/17/2021 1208   LABSPEC 1.015 03/17/2021 1208   PHURINE 5.0 03/17/2021 1208   GLUCOSEU 50 (A) 03/17/2021 1208   HGBUR SMALL (A) 03/17/2021 [place] 03/17/2021 1208   KETONESUR NEGATIVE  03/17/2021 1208   PROTEINUR >=300 (A) 03/17/2021 1208   UROBILINOGEN 0.2 01/17/2013 1910   NITRITE NEGATIVE 03/17/2021 1208   LEUKOCYTESUR LARGE (A) 03/17/2021 1208    Sepsis Labs: Lactic Acid, Venous    Component Value Date/Time   LATICACIDVEN 1.4 03/21/2021 0805    MICROBIOLOGY: No results found for this or any previous visit (from the past 240 hour(s)).  RADIOLOGY STUDIES/RESULTS: No results found.   LOS: 17 days   Oren Binet, MD  [facility]    To contact the attending provider between 7A-7P or the covering provider during after hours 7P-7A, please log into the web site [email or web address] and access using universal [place] password for that web site. If you do not have the password, please call the hospital operator.  04/02/2021, 12:17 PM

## 2021-04-02 NOTE — TOC Progression Note (Signed)
Transition of Care [facility]) - Progression Note    Patient Details  Name: Tracey Bryant MRN: [id number] Date of Birth: [name] 28, 1952  Transition of Care [facility]) CM/SW [place], [place] Phone Number: 04/02/2021, 12:56 PM  Clinical Narrative:    Mable Fill requesting updated therapy notes for insurance. CSW will send when available.    Expected Discharge Plan: [place] Barriers to Discharge: Insurance Authorization,Continued Medical Work up  Expected Discharge Plan and Services Expected Discharge Plan: [place] In-house Referral: Clinical Social Work   Post Acute Care Choice: [place] Living arrangements for the past 2 months: Single Family Home                                       Social Determinants of Health (SDOH) Interventions    Readmission Risk Interventions Readmission Risk Prevention Plan 03/19/2021 03/17/2021  Transportation Screening - Complete  PCP or Specialist Appt within 5-7 Days Complete -  Home Care Screening Complete -  Medication Review (RN CM) - Complete  Some recent data might be hidden

## 2021-04-02 NOTE — Progress Notes (Addendum)
Physical Therapy Treatment Patient Details Name: Tracey Bryant MRN: [id number] DOB: [birth year] Today's Date: 04/02/2021    History of Present Illness 70 y.o. female with presented to [facility] ED on 3/21  with nausea/vomiting/abdominal pain-further evaluation revealed acute calculus cholecystitis. Transferred to [facility] 3/24 for percutaneous gallbladder drain placement  Hospital course complicated by AKI, multifactorial anemia, and volume overload PMH: past medical history of DM-2, HTN, A. fib, chronic systolic heart failure, s/p permanent pacemaker placement    PT Comments    Pt supine in bed with HoB only slightly elevated on entry. Pt awake but not her usual animated self, but eager to work with therapy "to get better." Inclined HoB to provide assist for pt to get to EoB, pt reports slight lightheadedness before attempting to come to seated EoB. Pt able to manage LE off bed and started to utilize bedrail to pull to seated when pt stopped talking and fell back into the bed. Pt with glazed look in eyes, provided sternal rub and increased volume questioning. Pt requires total A to return LE to bed. RN notified and BP checked. Attempted to elevate HoB, when pt became unresponsive again with similar recovery when HoB flattened. RN again called. Pt had one more slightly shorter bout of syncope while Rn was getting EKG machine. MD notified and in room at end of session  Positional BPs  Supine 148/70  After attempt to come to seated EoB  149/72  After HoB elevated  146/72       Follow Up Recommendations  SNF     Equipment Recommendations  None recommended by PT       Precautions / Restrictions Precautions Precautions: Fall Precaution Comments: monitor BP, hemoglobin, B knee buckling with transfers    Mobility  Bed Mobility Overal bed mobility: Needs Assistance Bed Mobility: Supine to Sit     Supine to sit: Supervision;HOB elevated Sit to supine: Total assist   General bed mobility  comments: pt able to reach to bedrail and manage LE off bed, when she stopped talking and fell back into the bed, eyes glazed over and would not respond to questions, provided total A to return LE to bed, while calling for RN                               Balance       Sitting balance - Comments: unable to achieve today                                    Cognition Arousal/Alertness: Awake/alert (reports increased sleepiness and is not as animated as usual) Behavior During Therapy: WFL for tasks assessed/performed Overall Cognitive Status: Within Functional Limits for tasks assessed                                           General Comments General comments (skin integrity, edema, etc.): Pt with 3x syncopal episodes during session, despite consistent BP with positional change, pt with lightheadedness followed by unresponsiveness, provided stenal rub and increased verbal cuing and pt began to respond again      Pertinent Vitals/Pain Pain Assessment: No/denies pain           PT Goals (current goals can now be found in  the care plan section) Acute Rehab PT Goals Patient Stated Goal: get up without passing out PT Goal Formulation: With patient Time For Goal Achievement: 04/09/21 Potential to Achieve Goals: Good Progress towards PT goals: Not progressing toward goals - comment (3x syncopal episodes)    Frequency    Min 3X/week      PT Plan Current plan remains appropriate       AM-PAC PT "6 Clicks" Mobility   Outcome Measure  Help needed turning from your back to your side while in a flat bed without using bedrails?: A Little Help needed moving from lying on your back to sitting on the side of a flat bed without using bedrails?: A Little Help needed moving to and from a bed to a chair (including a wheelchair)?: A Lot Help needed standing up from a chair using your arms (e.g., wheelchair or bedside chair)?: A Lot Help  needed to walk in hospital room?: A Lot Help needed climbing 3-5 steps with a railing? : Total 6 Click Score: 13    End of Session Equipment Utilized During Treatment: Gait belt Activity Tolerance: Patient tolerated treatment well Patient left: with call bell/phone within reach;in chair;with chair alarm set;with nursing/sitter in room Nurse Communication: Mobility status;Other (comment) (no orthostatic hypotension) PT Visit Diagnosis: Unsteadiness on feet (R26.81);Other abnormalities of gait and mobility (R26.89);Muscle weakness (generalized) (M62.81)     Time: 5573-2202 PT Time Calculation (min) (ACUTE ONLY): 34 min  Charges:  $Therapeutic Activity: 23-37 mins                     S[place]andoah Yeats B. Migdalia Dk PT, DPT Acute Rehabilitation Services Pager [phone] Office [phone]    [place] 04/02/2021, 2:14 PM

## 2021-04-02 NOTE — Progress Notes (Signed)
Patient lost consciousness for a few seconds when working with PT. Patient had total of 4 episodes. Vital signs stable, MD is aware. New order received.

## 2021-04-02 NOTE — Progress Notes (Signed)
OT Cancellation Note  Patient Details Name: Tristy D Bottenfield MRN: [id number] DOB: [birth year]   Cancelled Treatment:    Reason Eval/Treat Not Completed: Medical issues which prohibited therapy Pt with syncopal episodes with PT, therapy will continue to follow.  Corinne Ports [place]. [place], [place] Acute Rehabilitation Services [place] 04/02/2021, 12:42 PM

## 2021-04-02 NOTE — Care Management Important Message (Signed)
Important Message  Patient Details  Name: Tracey Bryant MRN: [id number] Date of Birth: 1951/10/16   Medicare Important Message Given:  Yes     [place] 04/02/2021, 12:43 PM

## 2021-04-03 ENCOUNTER — Inpatient Hospital Stay (HOSPITAL_COMMUNITY): Payer: Medicare HMO

## 2021-04-03 DIAGNOSIS — N390 Urinary tract infection, site not specified: Secondary | ICD-10-CM | POA: Diagnosis not present

## 2021-04-03 DIAGNOSIS — N179 Acute kidney failure, unspecified: Secondary | ICD-10-CM | POA: Diagnosis not present

## 2021-04-03 DIAGNOSIS — K810 Acute cholecystitis: Secondary | ICD-10-CM | POA: Diagnosis not present

## 2021-04-03 DIAGNOSIS — E0810 Diabetes mellitus due to underlying condition with ketoacidosis without coma: Secondary | ICD-10-CM | POA: Diagnosis not present

## 2021-04-03 LAB — BASIC METABOLIC PANEL
Anion gap: 9 (ref 5–15)
BUN: 38 mg/dL — ABNORMAL HIGH (ref 8–23)
CO2: 36 mmol/L — ABNORMAL HIGH (ref 22–32)
Calcium: 9.2 mg/dL (ref 8.9–10.3)
Chloride: 93 mmol/L — ABNORMAL LOW (ref 98–111)
Creatinine, Ser: 2.81 mg/dL — ABNORMAL HIGH (ref 0.44–1.00)
GFR, Estimated: 18 mL/min — ABNORMAL LOW (ref >60)
Glucose, Bld: 164 mg/dL — ABNORMAL HIGH (ref 70–99)
Potassium: 4.5 mmol/L (ref 3.5–5.1)
Sodium: 138 mmol/L (ref 135–145)

## 2021-04-03 LAB — GLUCOSE, CAPILLARY
Glucose-Capillary: 100 mg/dL — ABNORMAL HIGH (ref 70–99)
Glucose-Capillary: 132 mg/dL — ABNORMAL HIGH (ref 70–99)
Glucose-Capillary: 127 mg/dL — ABNORMAL HIGH (ref 70–99)
Glucose-Capillary: 240 mg/dL — ABNORMAL HIGH (ref 70–99)

## 2021-04-03 LAB — CBC
HCT: 29.1 % — ABNORMAL LOW (ref 36.0–46.0)
Hemoglobin: 8.8 g/dL — ABNORMAL LOW (ref 12.0–15.0)
MCH: 30 pg (ref 26.0–34.0)
MCHC: 30.2 g/dL (ref 30.0–36.0)
MCV: 99.3 fL (ref 80.0–100.0)
Platelets: 450 10*3/uL — ABNORMAL HIGH (ref 150–400)
RBC: 2.93 MIL/uL — ABNORMAL LOW (ref 3.87–5.11)
RDW: 19.4 % — ABNORMAL HIGH (ref 11.5–15.5)
WBC: 9.1 10*3/uL (ref 4.0–10.5)
nRBC: 0 % (ref 0.0–0.2)

## 2021-04-03 IMAGING — CT CT HEAD W/O CM
4 series · 16 of 47 positions shown, 18 images · non-contrast
Comparison: None.

CLINICAL DATA: Dizziness

EXAM:
CT HEAD WITHOUT CONTRAST
TECHNIQUE: Contiguous axial images were obtained from the base of the skull
through the vertex without intravenous contrast.

[Series 3: head wo · axial · 0.47mm/px · z∈[-167,-52]mm · 7 of 31 slices shown, 9 images]
[im 4/31  brain]
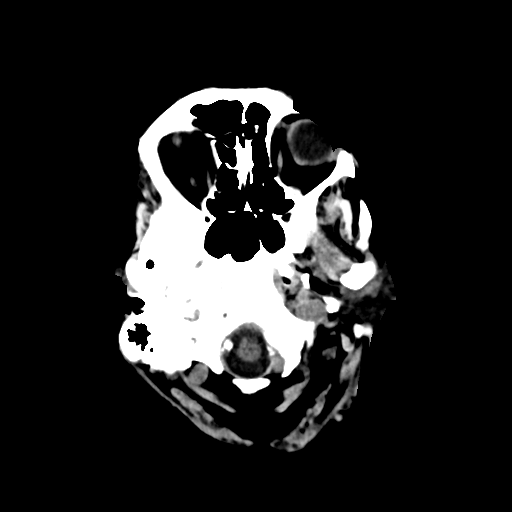
[im 4/31  bone]
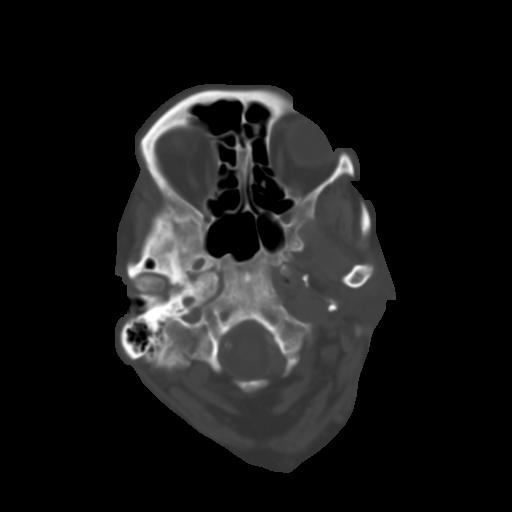
[im 8/31  brain]
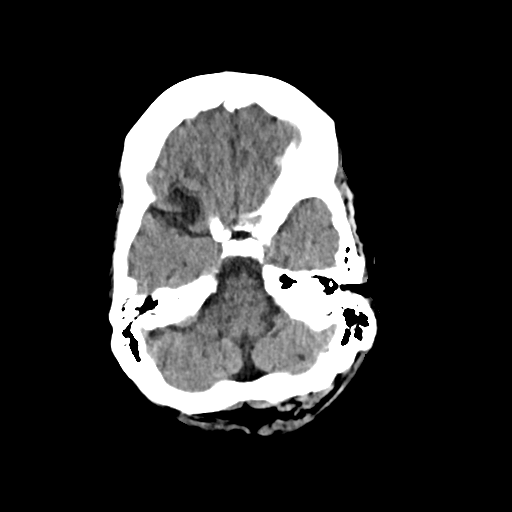
[im 12/31  brain]
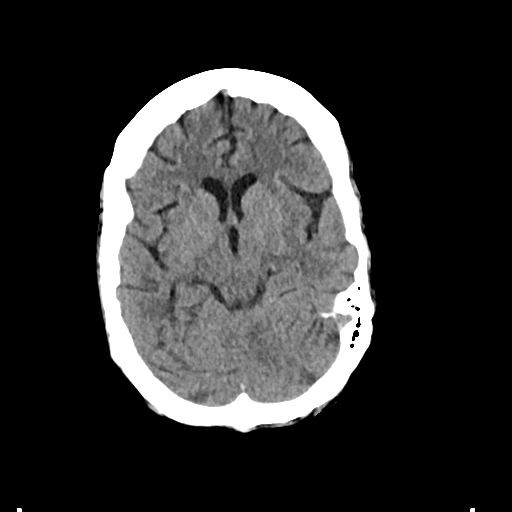
[im 16/31  brain]
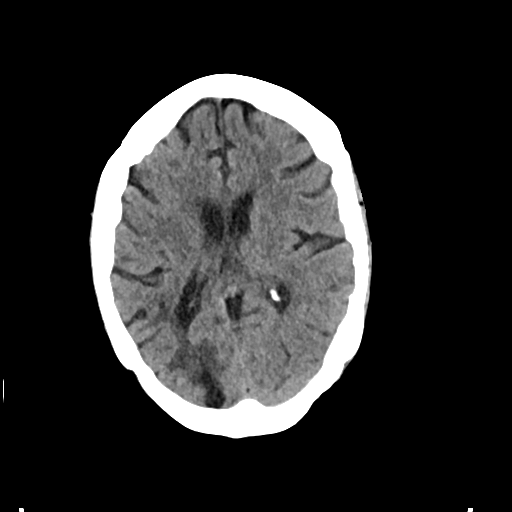
[im 19/31  brain]
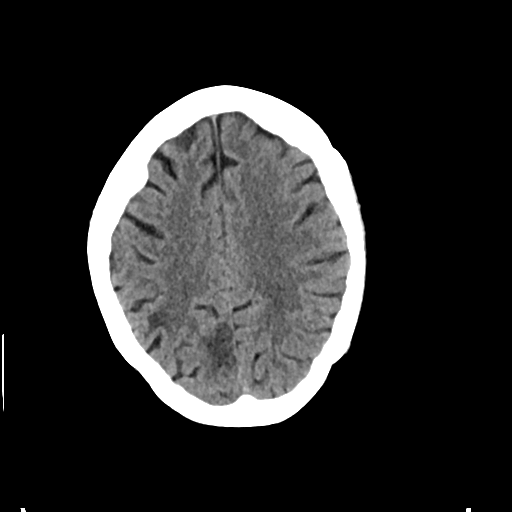
[im 19/31  bone]
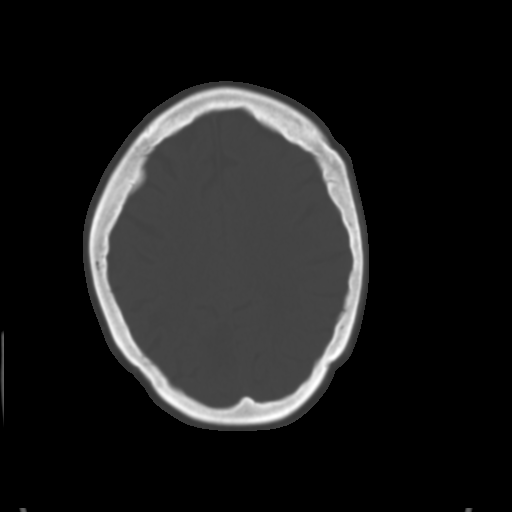
[im 23/31  brain]
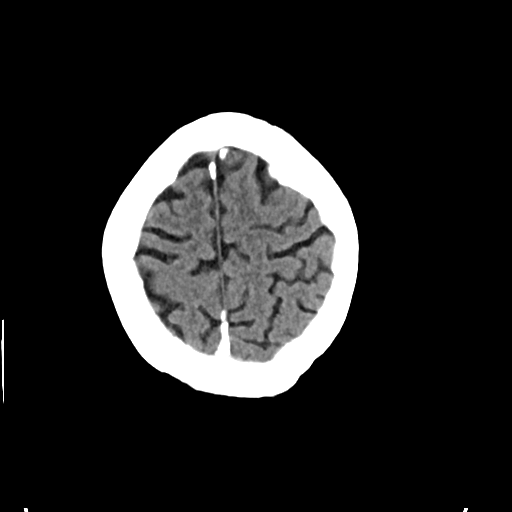
[im 27/31  brain]
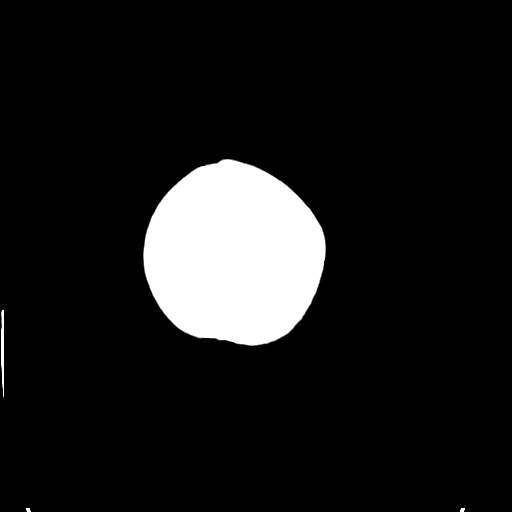

[Series 4: head bone · axial · 0.47mm/px · z∈[-168,-136]mm · 3 of 78 slices shown]
[im 8/78  bone]
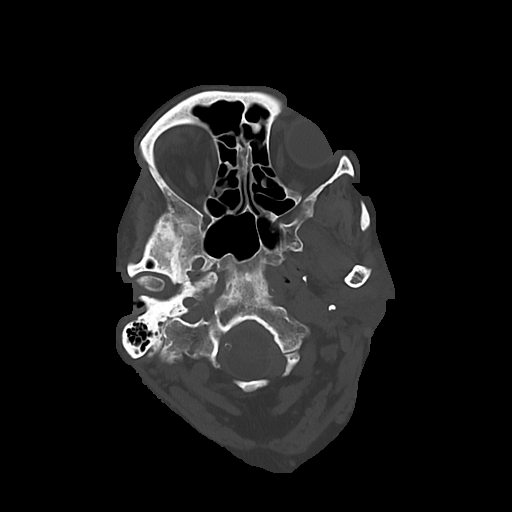
[im 16/78  bone]
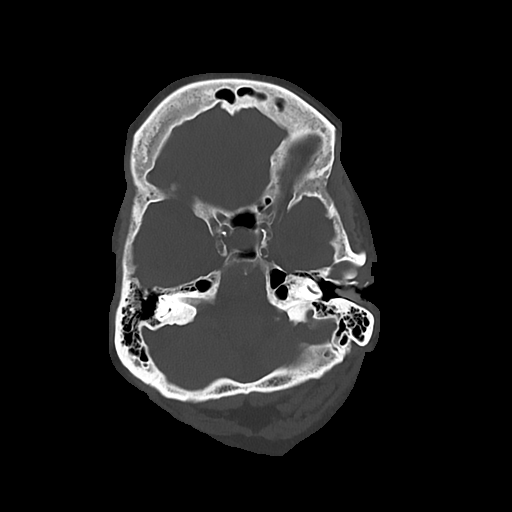
[im 24/78  bone]
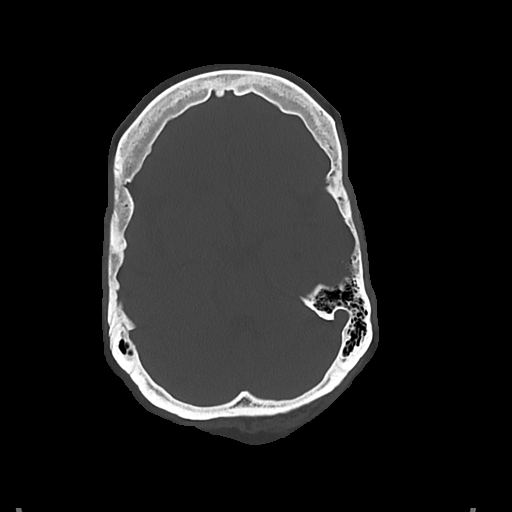

[Series 5: cor soft · coronal · 0.36mm/px · 3 of 74 slices shown]
[im 25/74  brain]
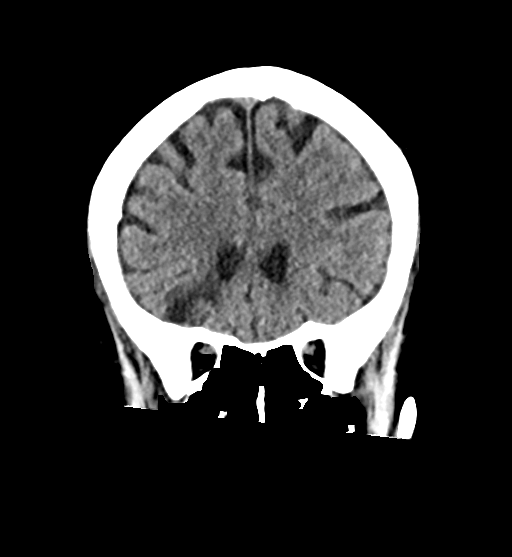
[im 33/74  brain]
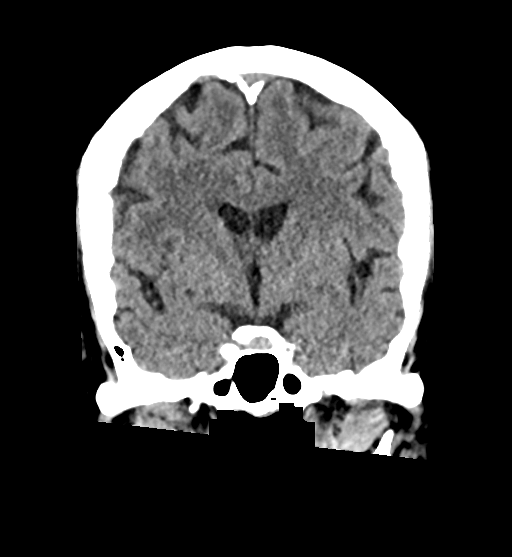
[im 41/74  brain]
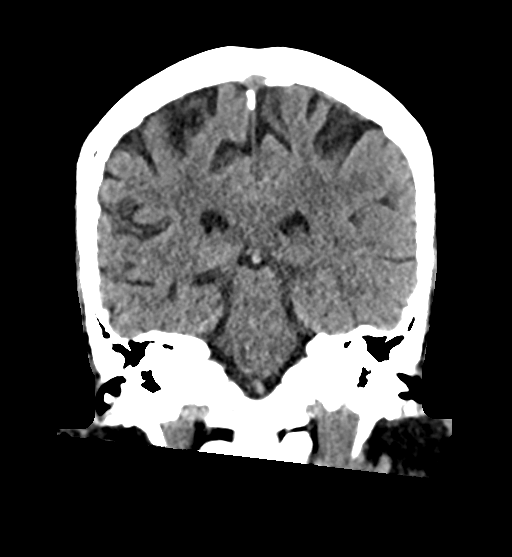

[Series 6: sag soft · sagittal · 0.39mm/px · 3 of 59 slices shown]
[im 23/59  brain]
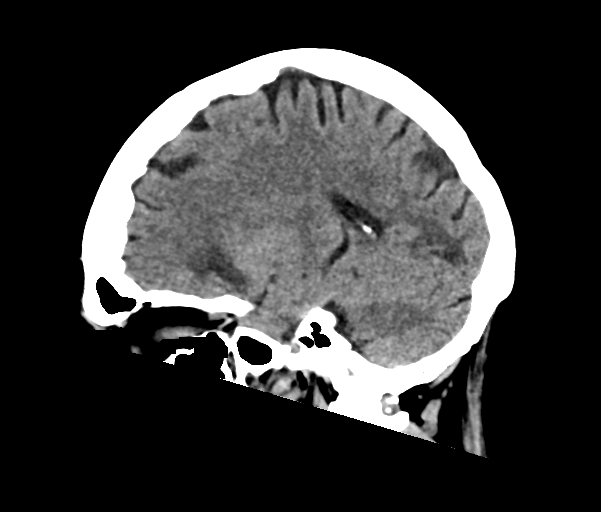
[im 30/59  brain]
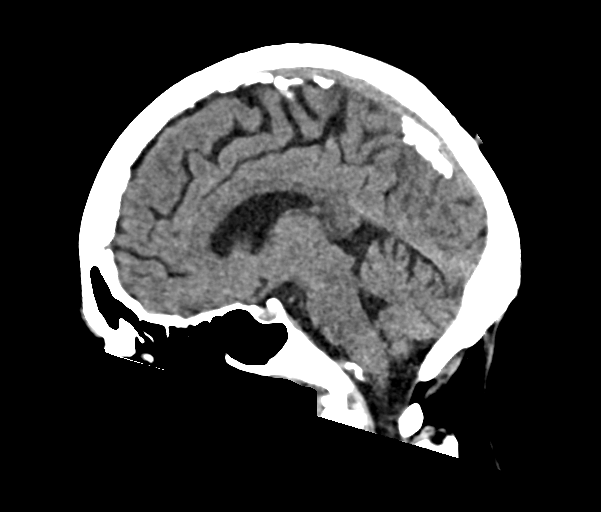
[im 37/59  brain]
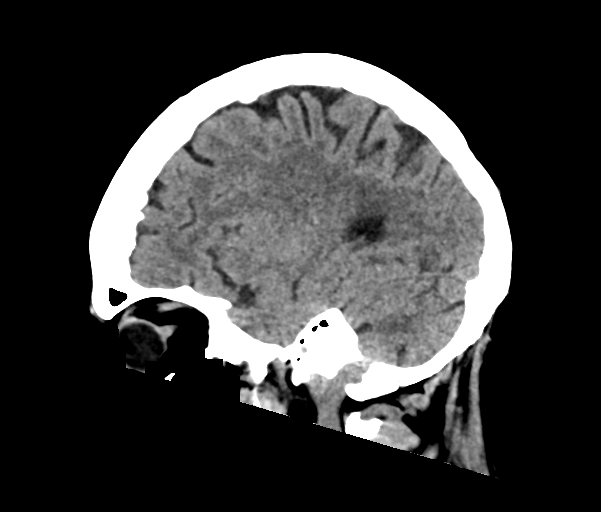

[16 of 47 positions shown; findings below may reference images not displayed]

FINDINGS: Brain: There is encephalomalacia in the right inferior frontal,
occipital and parietal lobes. There is an age indeterminate but
likely chronic infarct left cerebellum. There is periventricular
hypoattenuation compatible with chronic microvascular disease. No
hemorrhage or extra-axial collection. No midline shift, other mass
effect or hydrocephalus.

Vascular: Atherosclerotic calcification of the vertebral and
internal carotid arteries at the skull base. No abnormal
hyperdensity of the major intracranial arteries or dural venous
sinuses.

Skull: The visualized skull base, calvarium and extracranial soft
tissues are normal.

Sinuses/Orbits: No fluid levels or advanced mucosal thickening of
the visualized paranasal sinuses. No mastoid or middle ear effusion.
The orbits are normal.
IMPRESSION: 1. No intracranial hemorrhage or mass effect.
2. Multiple subacute to chronic infarcts within the right cerebral
and left cerebellar hemispheres. MRI recommended for better temporal
characterization.
3. Chronic microvascular ischemia.

## 2021-04-03 MED ORDER — SODIUM CHLORIDE 0.9 % IV SOLN: INTRAVENOUS | Status: AC

## 2021-04-03 NOTE — TOC Progression Note (Signed)
Transition of Care [facility]) - Progression Note    Patient Details  Name: Tracey Bryant MRN: [id number] Date of Birth: 10/31/1951  [facility] CM/SW [place], [place] Phone Number: 04/03/2021, 4:44 PM  Clinical Narrative:    Per Manuela Schwartz at [facility], insurance has not been approved yet. She will contact weekend CSW with updates.    Expected Discharge Plan: [place] Barriers to Discharge: Insurance Authorization,Continued Medical Work up  Expected Discharge Plan and Services Expected Discharge Plan: [place] In-house Referral: Clinical Social Work   Post Acute Care Choice: [place] Living arrangements for the past 2 months: Single Family Home                                       Social Determinants of Health (SDOH) Interventions    Readmission Risk Interventions Readmission Risk Prevention Plan 03/19/2021 03/17/2021  Transportation Screening - Complete  PCP or Specialist Appt within 5-7 Days Complete -  Home Care Screening Complete -  Medication Review (RN CM) - Complete  Some recent data might be hidden

## 2021-04-03 NOTE — Progress Notes (Signed)
EEG complete - results pending 

## 2021-04-03 NOTE — Progress Notes (Signed)
Physical Therapy Treatment Patient Details Name: Tracey Bryant MRN: [id number] DOB: 1951-11-13 Today's Date: 04/03/2021    History of Present Illness 70 y.o. female with presented to [facility] ED on 3/21  with nausea/vomiting/abdominal pain-further evaluation revealed acute calculus cholecystitis. Transferred to [facility] 3/24 for percutaneous gallbladder drain placement  Hospital course complicated by AKI, multifactorial anemia, and volume overload PMH: past medical history of DM-2, HTN, A. fib, chronic systolic heart failure, s/p permanent pacemaker placement    PT Comments    Pt is agreeable to work with therapy however is anxious about having additional syncopal episodes. Pt sitting in bed on entry with HoB approx 70 degrees. BP 132/117. Attempted increasing HoB 5 degrees pt with syncopal episode, BP in line with prior measurement. Pt recovers within 1-2 min Attempted to scoot pt up in bed and pt with another syncopal episode. Pt syncopal with raising hands passively, lowering legs, raising legs, raising bed without alerting pt, pt continues to recover within 1-2 min. By end of session pt bed only at 34 degrees elevation. MD and RN notified. D/c plans remain appropriate. PT will continue to follow acutely.    Follow Up Recommendations  SNF     Equipment Recommendations  None recommended by PT       Precautions / Restrictions Precautions Precautions: Fall Precaution Comments: monitor BP, hemoglobin, B knee buckling with transfers, frequent syncopal episodes Restrictions Weight Bearing Restrictions: No    Mobility  Bed Mobility Overal bed mobility: Needs Assistance             General bed mobility comments: Various adjustments of HOB degrees to assess tolerance. Pt received with HOB approx 70* with no issues. adjusted gradually to 85* with syncope noted so HOB lowered. Attempted again slowly during session but ultimately unable to acheive HOB > 35* without syncopal episodes.  Elevation of foot of bed, raising UE (more so when passively moved) resulted in same syncopal presenation    Transfers                 General transfer comment: unable due to syncope         Cognition Arousal/Alertness: Awake/alert Behavior During Therapy: Anxious Overall Cognitive Status: Within Functional Limits for tasks assessed                                 General Comments: anxious about repeated passing out         General Comments General comments (skin integrity, edema, etc.): Pt with >10 syncopal episodes during session with HOB, foot of bed, or raising of UEs. BP WFL throughout, no significant changes. 132/117 on initial entry at 70*. 159/80, 143/59, and 152/72 throughout session on R UE with trial on forearm with latter BP reading. HR WFL 60s throughout, SpO2 WFL      Pertinent Vitals/Pain Pain Assessment: No/denies pain           PT Goals (current goals can now be found in the care plan section) Acute Rehab PT Goals Patient Stated Goal: get up without passing out PT Goal Formulation: With patient Time For Goal Achievement: 04/09/21 Potential to Achieve Goals: Good Progress towards PT goals: Not progressing toward goals - comment (continues to have syncopal episodes limiting mobility)    Frequency    Min 3X/week      PT Plan Current plan remains appropriate       AM-PAC PT "6 Clicks" Mobility  Outcome Measure  Help needed turning from your back to your side while in a flat bed without using bedrails?: A Little Help needed moving from lying on your back to sitting on the side of a flat bed without using bedrails?: A Little Help needed moving to and from a bed to a chair (including a wheelchair)?: A Lot Help needed standing up from a chair using your arms (e.g., wheelchair or bedside chair)?: A Lot Help needed to walk in hospital room?: A Lot Help needed climbing 3-5 steps with a railing? : Total 6 Click Score: 13    End  of Session   Activity Tolerance: Patient tolerated treatment well Patient left: with call bell/phone within reach;with bed alarm set;in bed Nurse Communication: Mobility status;Other (comment) (multiple bouts of syncope) PT Visit Diagnosis: Unsteadiness on feet (R26.81);Other abnormalities of gait and mobility (R26.89);Muscle weakness (generalized) (M62.81)     Time: 2897-9150 PT Time Calculation (min) (ACUTE ONLY): 54 min  Charges:  $Therapeutic Activity: 23-37 mins                     Arling Cerone B. Migdalia Dk PT, DPT Acute Rehabilitation Services Pager [phone] Office [phone]    [place] 04/03/2021, 3:47 PM

## 2021-04-03 NOTE — Progress Notes (Signed)
Occupational Therapy Treatment Patient Details Name: Tracey Bryant MRN: [id number] DOB: [birth year] Today's Date: 04/03/2021    History of present illness 70 y.o. female with presented to [facility] ED on 3/21  with nausea/vomiting/abdominal pain-further evaluation revealed acute calculus cholecystitis. Transferred to [facility] 3/24 for percutaneous gallbladder drain placement  Hospital course complicated by AKI, multifactorial anemia, and volume overload PMH: past medical history of DM-2, HTN, A. fib, chronic systolic heart failure, s/p permanent pacemaker placement   OT comments  On entry, pt resting in bed with HOB approx 70* with usual pleasant demeanor. Slowly increased HOB to approximately 85* before pt became unresponsive. Pt able to be roused quickly to touch and voice, as well as lowering of HOB. Pt experienced > 10 syncopal episodes during this session with HOB elevation, B LE elevation and lifting of B UE (for NT to check blood sugar, washing face, etc.) Pt appeared to experience syncopal episodes more quickly with passive movement than active movement (and muscle contractions) though apparent with all movement. Ultimately, unable to get EOB/OOB today safely and pt unable to tolerate HOB higher than 35* by end of session. RN in to assess this presentations as well. BP, HR, and SpO2 WFL throughout.  BP at start of session: 132/117 During session: 159/80, 143/50, and 152/72 (this one taken on L forearm)   Follow Up Recommendations  SNF;Supervision/Assistance - 24 hour    Equipment Recommendations  None recommended by OT    Recommendations for Other Services      Precautions / Restrictions Precautions Precautions: Fall Precaution Comments: monitor BP, hemoglobin, B knee buckling with transfers, frequent syncopal episodes Restrictions Weight Bearing Restrictions: No       Mobility Bed Mobility Overal bed mobility: Needs Assistance             General bed mobility comments:  Various adjustments of HOB degrees to assess tolerance. Pt received with HOB approx 70* with no issues. adjusted gradually to 85* with syncope noted so HOB lowered. Attempted again slowly during session but ultimately unable to acheive HOB > 35* without syncopal episodes. Elevation of foot of bed, raising UE (more so when passively moved) resulted in same syncopal presenation    Transfers                 General transfer comment: unable due to syncope    Balance                                           ADL either performed or assessed with clinical judgement   ADL Overall ADL's : Needs assistance/impaired Eating/Feeding: Set up;Bed level Eating/Feeding Details (indicate cue type and reason): Setup to drink from cup using B UE bed level Grooming: Set up;Wash/dry face;Bed level Grooming Details (indicate cue type and reason): Setup to wash face bed level. pt able to complete this task approx 2 min before sudden drop in UE and loss of consciousness                               General ADL Comments: Session focused on assessment of tolerance with positional changes with frequent syncopal episodes bed level. unable to progress OOB safely today     Vision   Vision Assessment?: Vision impaired- to be further tested in functional context Additional Comments: legally blind  Perception     Praxis      Cognition Arousal/Alertness: Awake/alert Behavior During Therapy: WFL for tasks assessed/performed Overall Cognitive Status: Within Functional Limits for tasks assessed                                          Exercises     Shoulder Instructions       General Comments Pt with >10 syncopal episodes during session with HOB, foot of bed, or raising of UEs. BP WFL throughout, no significant changes. 132/117 on initial entry at 70*. 159/80, 143/59, and 152/72 throughout session on R UE with trial on forearm with latter BP reading.  HR WFL 60s throughout, SpO2 WFL    Pertinent Vitals/ Pain       Pain Assessment: No/denies pain  Home Living                                          Prior Functioning/Environment              Frequency  Min 2X/week        Progress Toward Goals  OT Goals(current goals can now be found in the care plan section)  Progress towards OT goals: Progressing toward goals  Acute Rehab OT Goals Patient Stated Goal: get up without passing out OT Goal Formulation: With patient Time For Goal Achievement: 04/09/21 Potential to Achieve Goals: Good ADL Goals Pt Will Perform Grooming: with set-up;standing Pt Will Perform Upper Body Dressing: with set-up;sitting Pt Will Perform Lower Body Dressing: with min guard assist;sitting/lateral leans;sit to/from stand;with adaptive equipment Pt Will Transfer to Toilet: with supervision;stand pivot transfer;grab bars Pt/caregiver will Perform Home Exercise Program: Both right and left upper extremity;Increased strength;With Supervision  Plan Discharge plan remains appropriate    Co-evaluation                 AM-PAC OT "6 Clicks" Daily Activity     Outcome Measure   Help from another person eating meals?: A Little Help from another person taking care of personal grooming?: A Little Help from another person toileting, which includes using toliet, bedpan, or urinal?: A Lot Help from another person bathing (including washing, rinsing, drying)?: A Lot Help from another person to put on and taking off regular upper body clothing?: A Little Help from another person to put on and taking off regular lower body clothing?: A Lot 6 Click Score: 15    End of Session Equipment Utilized During Treatment: Oxygen  OT Visit Diagnosis: Unsteadiness on feet (R26.81);Muscle weakness (generalized) (M62.81);Dizziness and giddiness (R42)   Activity Tolerance Patient tolerated treatment well   Patient Left in bed;with call bell/phone  within reach;with bed alarm set   Nurse Communication Mobility status;Other (comment) (BP readings, syncope. RN present for some of session)        Time: [phone] OT Time Calculation (min): 58 min  Charges: OT General Charges $OT Visit: 1 Visit OT Treatments $Self Care/Home Management : 8-22 mins $Therapeutic Activity: 8-22 mins  Malachy Chamber, OTR/L Acute Rehab Services Office: [phone]   [name] 04/03/2021, 1:20 PM

## 2021-04-03 NOTE — Progress Notes (Addendum)
PROGRESS NOTE        PATIENT DETAILS Name: Tracey Bryant Age: 70 y.o. Sex: female Date of Birth: Jun 09, 1951 Admit Date: 03/16/2021 Admitting Physician Deatra James, MD VWU:[id number], Arlyn Leak, FNP  Brief Narrative: Patient is a 70 y.o. female with past medical history of DM-2, HTN, A. fib, chronic systolic heart failure, s/p permanent pacemaker placement-presented to the ED on 3/21  with nausea/vomiting/abdominal pain-further evaluation revealed acute calculus cholecystitis.  Hospital course complicated by AKI, multifactorial anemia, volume overload, age indeterminate left DVT.  See below for further details  Significant events: 3/20>> ED visit for epistaxis 3/21>> admit to [facility] for acute calculus cholecystitis 3/24>> transfer to [facility] for percutaneous gallbladder drain placement 4/3>>syncope while working with PT 4/7-4/8>> very short duration syncopal events (few seconds) when working with PT/OT.  Telemetry negative.  PPM interrogated-no arrhythmia/pauses.  Orthostatic vital signs done by PT negative  Significant studies: 3/22 >> CT abdomen/pelvis: Cholelithiasis, bilateral small pleural effusions, ascites with anasarca 3/23>> RUQ ultrasound: Acute calculus cholecystitis 3/23>> Echo: EF 25-30%, decreased RV systolic function 7/82>> mild central pulmonary vascular congestion. 4/2>> lower extremity Doppler: Age indeterminate DVT left popliteal vein.  Antimicrobial therapy: Zosyn: 3/22>>3/31  Microbiology data: 3/22>> urine culture: E. Coli 3/22>> blood culture: No growth 3/24>> gallbladder/bile culture: No growth  Procedures : 3/24>> CT-guided cholecystostomy tube placement  Consults: IR, general surgery, nephrology, gastroenterology  DVT Prophylaxis : Eliquis  Subjective: Claims she continues to feel better-this morning-when I set her up in bed-she had no dizziness/lightheadedness or syncopal episodes.  However when she worked with PT/OT later  on-she apparently had very brief episodes of syncope with change in posture-with even raising arm.   Assessment/Plan: Severe sepsis due to acute calculus cholecystitis and UTI: Sepsis physiology has resolved-culture data as above- completed 10 days of Zosyn on 3/31.   Acute calculus cholecystitis:evaluated by general surgery at [facility]-not a surgical candidate-hence percutaneous cholecystostomy tube placed.  IR following.  Transaminitis: Multifactorial-probably due to sepsis/acute cholecystitis-downtrending-continue to follow periodically.  Acute on chronic systolic/diastolic heart failure: Volume status has improved significantly-she is now more than 20 L negative balance.  She has very mild evidence of fluid overload-due to repeated episodes of postural syncope (see below) holding diuretics for now.  AKI on CKD stage IIIa: AKI likely hemodynamically mediated-gradually improving-creatinine downtrending but has somewhat plateaued.  Volume status is markedly improved over the past few days.  Syncope: Occurred on 4/3 while working with physical therapy-and again on 4/7 x 2-(initial episode when standing up with PT-second episode while in bed).  Per PT -patient again had numerous episodes of syncope-even with just lifting up her arm on 4/8.  Orthostatic vital signs during these episodes were negative.  Telemetry continues to be negative.  I subsequently reevaluated the patient-even on simple hand raising-she briefly becomes unresponsive-however during these episodes (curbsided neurology-Dr. Rory Percy was at bedside)-seems to have muscle tone-and on letting her hand drop-she avoids her face.  Could be a functional element-continue supportive care and telemetry monitoring.  Neurology recommends getting a spot EEG and CT head to ensure that we are not missing out on anything.  Cardiology interrogated her pacemaker on 4/7-no pauses or arrhythmias seen.  Will continue to hold diuretics-and plan on gently hydrating for  a few hours.  Continue to maintain TED hose.   Acute urinary retention: Foley catheter placed on 3/26-started Flomax  on 3/30-successful voiding trial on 4/2-no longer with Foley catheter.  No longer on Flomax.  Hyponatremia: Due to hypervolemia-resolved with diuretics.  Anemia: Multifactorial-some component of acute blood loss due to epistaxis that occurred 1 day prior to this hospitalization-on top of anemia due to chronic disease/critical illness.  Also FOBT positive-no overt GI blood loss.  Is a [name] continues to refuse blood transfusion-she confirms that she will refuse blood-even in the face of life-threatening situation.  Hemoglobin levels are slowly improving with IV iron and Aranesp.  Continue to monitor CBC periodically.   Left lower leg DVT: Age indeterminate-but suspect must have occurred this admission as she is maintained on Eliquis-unfortunately Eliquis was discontinued as patient had epistaxis-and given the severity of anemia.  After extensive discussion with patient/son-regarding risk/benefits-IV heparin was started on 4/2 and subsequently transitioned to SQ Lovenox on 4/4.  Since hemoglobin has stabilized-has been transitioned to Eliquis.   PAF: Appears to be in sinus rhythm-Eliquis initially on hold due to severity of anemia and recent history of epistaxis-however now due to diagnosis of DVT-on therapeutic anticoagulation.  History of CVA: Nonfocal exam-difficult situation-Eliquis resumed.  HLD: On Zetia  DM-2: CBG stable-continue Lantus 20 units daily and SSI.  Recent Labs    04/02/21 2149 04/03/21 0820 04/03/21 1207  GLUCAP 168* 100* 132*    Obesity: Estimated body mass index is 31.92 kg/m as calculated from the following:   Height as of this encounter: 5\' 6"  (1.676 m).   Weight as of this encounter: 89.7 kg.   Debility/deconditioning: Due to acute illness-PT/OT recommending CIR-however probably will require SNF per notes from CIR.  Diet: Diet  Order            Diet heart healthy/carb modified Room service appropriate? Yes; Fluid consistency: Thin; Fluid restriction: 1500 mL Fluid  Diet effective now                  Code Status: Full code  Family Communication: [phone] with son on 4/8  Disposition Plan: Status is: Inpatient  Remains inpatient appropriate because:Inpatient level of care appropriate due to severity of illness   Dispo: The patient is from: Home              Anticipated d/c is to: SNF              Patient currently is not medically stable to d/c.   Difficult to place patient No    Barriers to Discharge: Numerous syncopal episodes-CT head/EEG pending  Antimicrobial agents: Anti-infectives (From admission, onward)   Start     Dose/Rate Route Frequency Ordered Stop   03/26/21 2200  piperacillin-tazobactam (ZOSYN) IVPB 3.375 g        3.375 g 12.5 mL/hr over 240 Minutes Intravenous Every 12 hours 03/26/21 1137 03/27/21 0158   03/18/21 2200  piperacillin-tazobactam (ZOSYN) IVPB 3.375 g  Status:  Discontinued        3.375 g 12.5 mL/hr over 240 Minutes Intravenous Every 12 hours 03/18/21 1201 03/26/21 1137   03/17/21 1400  piperacillin-tazobactam (ZOSYN) IVPB 3.375 g  Status:  Discontinued        3.375 g 12.5 mL/hr over 240 Minutes Intravenous Every 8 hours 03/17/21 1246 03/18/21 1201       Time spent: 25- minutes-Greater than 50% of this time was spent in counseling, explanation of diagnosis, planning of further management, and coordination of care.  MEDICATIONS: Scheduled Meds: . apixaban  5 mg Oral BID  . Chlorhexidine Gluconate Cloth  6  each Topical Daily  . darbepoetin (ARANESP) injection - NON-DIALYSIS  60 mcg Subcutaneous Q Sat-1800  . ezetimibe  10 mg Oral Daily  . insulin aspart  0-15 Units Subcutaneous TID WC  . insulin aspart  0-5 Units Subcutaneous QHS  . insulin glargine  20 Units Subcutaneous Daily  . multivitamin with minerals  1 tablet Oral Daily  . sodium  bicarbonate  1,300 mg Oral TID  . sodium chloride flush  3 mL Intravenous Q12H  . sodium chloride flush  5 mL Intracatheter Q8H   Continuous Infusions: . sodium chloride     PRN Meds:.bisacodyl, dextrose, HYDROmorphone (DILAUDID) injection, ipratropium, levalbuterol, ondansetron **OR** ondansetron (ZOFRAN) IV, oxyCODONE, senna-docusate, traZODone   PHYSICAL EXAM: Vital signs: Vitals:   04/02/21 1725 04/02/21 2042 04/03/21 0437 04/03/21 1210  BP: 108/76 124/64 128/62 (!) 159/80  Pulse: 60 64 64 70  Resp: 18 17 16 18   Temp: 98.2 F (36.8 C) 98.1 F (36.7 C) 98 F (36.7 C) 98.8 F (37.1 C)  TempSrc: Oral Axillary Axillary Oral  SpO2: 98% 98% 98% 100%  Weight:      Height:       Filed Weights   03/31/21 0731 04/01/21 0357 04/02/21 0400  Weight: 96.1 kg 89.5 kg 89.7 kg   Body mass index is 31.92 kg/m.   Gen Exam:Alert awake-not in any distress HEENT:atraumatic, normocephalic Chest: B/L clear to auscultation anteriorly CVS:S1S2 regular Abdomen:soft non tender, non distended Extremities:trace edema Neurology: Non focal Skin: no rash  I have personally reviewed following labs and imaging studies  LABORATORY DATA: CBC: Recent Labs  Lab 03/29/21 0208 03/30/21 0249 04/01/21 0049 04/03/21 0113  WBC 12.3* 13.1* 9.9 9.1  HGB 7.2* 7.5* 8.4* 8.8*  HCT 23.7* 25.6* 27.8* 29.1*  MCV 98.3 99.6 98.9 99.3  PLT 348 372 433* 450*    Basic Metabolic Panel: Recent Labs  Lab 03/29/21 0208 03/30/21 0249 03/31/21 0114 04/01/21 0049 04/02/21 0340 04/03/21 0113  NA 138 137 138 138 140 138  K 3.8 3.9 4.6 4.3 4.6 4.5  CL 102 101 98 94* 93* 93*  CO2 27 27 30  33* 37* 36*  GLUCOSE 173* 136* 142* 196* 71 164*  BUN 55* 54* 50* 45* 42* 38*  CREATININE 2.78* 2.61* 2.56* 2.40* 2.44* 2.81*  CALCIUM 8.2* 8.3* 8.7* 8.9 9.4 9.2  MG 2.6*  --   --   --   --   --     GFR: Estimated Creatinine Clearance: 21.3 mL/min (A) (by C-G formula based on SCr of 2.81 mg/dL (H)).  Liver  Function Tests: No results for input(s): AST, ALT, ALKPHOS, BILITOT, PROT, ALBUMIN in the last 168 hours. No results for input(s): LIPASE, AMYLASE in the last 168 hours. No results for input(s): AMMONIA in the last 168 hours.  Coagulation Profile: No results for input(s): INR, PROTIME in the last 168 hours.  Cardiac Enzymes: No results for input(s): CKTOTAL, CKMB, CKMBINDEX, TROPONINI in the last 168 hours.  BNP (last 3 results) No results for input(s): PROBNP in the last 8760 hours.  Lipid Profile: No results for input(s): CHOL, HDL, LDLCALC, TRIG, CHOLHDL, LDLDIRECT in the last 72 hours.  Thyroid Function Tests: No results for input(s): TSH, T4TOTAL, FREET4, T3FREE, THYROIDAB in the last 72 hours.  Anemia Panel: No results for input(s): VITAMINB12, FOLATE, FERRITIN, TIBC, IRON, RETICCTPCT in the last 72 hours.  Urine analysis:    Component Value Date/Time   COLORURINE AMBER (A) 03/17/2021 1208   APPEARANCEUR CLOUDY (A) 03/17/2021 1208  LABSPEC 1.015 03/17/2021 1208   PHURINE 5.0 03/17/2021 1208   GLUCOSEU 50 (A) 03/17/2021 1208   HGBUR SMALL (A) 03/17/2021 1208   BILIRUBINUR NEGATIVE 03/17/2021 1208   KETONESUR NEGATIVE 03/17/2021 1208   PROTEINUR >=300 (A) 03/17/2021 1208   UROBILINOGEN 0.2 01/17/2013 1910   NITRITE NEGATIVE 03/17/2021 1208   LEUKOCYTESUR LARGE (A) 03/17/2021 1208    Sepsis Labs: Lactic Acid, Venous    Component Value Date/Time   LATICACIDVEN 1.4 03/21/2021 0805    MICROBIOLOGY: No results found for this or any previous visit (from the past 240 hour(s)).  RADIOLOGY STUDIES/RESULTS: No results found.   LOS: 18 days   Oren Binet, MD  [facility]    To contact the attending provider between 7A-7P or the covering provider during after hours 7P-7A, please log into the web site [email or web address] and access using universal [place] password for that web site. If you do not have the password, please call the hospital  operator.  04/03/2021, 1:09 PM

## 2021-04-04 ENCOUNTER — Inpatient Hospital Stay (HOSPITAL_COMMUNITY): Payer: Medicare HMO

## 2021-04-04 DIAGNOSIS — N179 Acute kidney failure, unspecified: Secondary | ICD-10-CM | POA: Diagnosis not present

## 2021-04-04 DIAGNOSIS — E0810 Diabetes mellitus due to underlying condition with ketoacidosis without coma: Secondary | ICD-10-CM | POA: Diagnosis not present

## 2021-04-04 DIAGNOSIS — I639 Cerebral infarction, unspecified: Secondary | ICD-10-CM

## 2021-04-04 DIAGNOSIS — N390 Urinary tract infection, site not specified: Secondary | ICD-10-CM | POA: Diagnosis not present

## 2021-04-04 DIAGNOSIS — K810 Acute cholecystitis: Secondary | ICD-10-CM | POA: Diagnosis not present

## 2021-04-04 LAB — BASIC METABOLIC PANEL
Anion gap: 10 (ref 5–15)
BUN: 32 mg/dL — ABNORMAL HIGH (ref 8–23)
CO2: 31 mmol/L (ref 22–32)
Calcium: 8.9 mg/dL (ref 8.9–10.3)
Chloride: 97 mmol/L — ABNORMAL LOW (ref 98–111)
Creatinine, Ser: 2.59 mg/dL — ABNORMAL HIGH (ref 0.44–1.00)
GFR, Estimated: 19 mL/min — ABNORMAL LOW (ref >60)
Glucose, Bld: 113 mg/dL — ABNORMAL HIGH (ref 70–99)
Potassium: 3.9 mmol/L (ref 3.5–5.1)
Sodium: 138 mmol/L (ref 135–145)

## 2021-04-04 LAB — GLUCOSE, CAPILLARY
Glucose-Capillary: 169 mg/dL — ABNORMAL HIGH (ref 70–99)
Glucose-Capillary: 106 mg/dL — ABNORMAL HIGH (ref 70–99)
Glucose-Capillary: 132 mg/dL — ABNORMAL HIGH (ref 70–99)
Glucose-Capillary: 159 mg/dL — ABNORMAL HIGH (ref 70–99)
Glucose-Capillary: 268 mg/dL — ABNORMAL HIGH (ref 70–99)

## 2021-04-04 NOTE — Plan of Care (Signed)

## 2021-04-04 NOTE — Consult Note (Signed)
Neurology Consultation  Reason for Consult: CT with multiple subacute to chronic infarcts Referring Physician: Dr. Sloan Leiter  CC: Syncopal events  History is obtained from: Chart review, Patient  HPI: Tracey Bryant is a 70 y.o. female with a medical history significant for type 2 diabetes mellitus, hypertension, atrial fibrillation on Eliquis, chronic systolic heart failure with permanent pacemaker, and multiple strokes- last in May 2021 with initial right hemiparesis that has since improved who presented to the hospital on 3/21 for acute calculus cholecystitis s/p percutaneous drain placement complicated by sepsis s/p 10 days of antibiotic treatment. Hospitalization complicated by an acute kidney injury, anemia, volume overload, LLE DVT (age indeterminate), and multiple syncopal spells. On 04/03/21 while working with physical and occupational therapy, Tracey Bryant experienced multiple episodes of syncope without explanation - orthostatic vital signs were stable and her hemoglobin has been stable. She also complained of syncope during passive raising of her arms when she was laying in bed without concomitant change in vitals or heart rate / rhythm on cardiac monitor. A CTH was obtained with evidence of multiple subacute to chronic infarcts. Neurology was consulted for further evaluation at this time.   Tracey Bryant states that the last time she had syncopal spells it was due to her strokes. She states she had a stroke in May 2021 and that at that time, she experienced syncope. She states that she has been on and off of Eliquis since that time due to bleeding and her renal function. She states that when she had a stroke in May, she had right hemiparesis but that the weakness resolved without residual stroke symptoms.   ROS: A complete ROS was performed and is negative except as noted in the HPI.   Past Medical History:  Diagnosis Date  . Arthritis   . Cardiomyopathy ([place])   . Diabetes mellitus  without complication ([place])   . Hypertension   . Stroke [facility])    Past Surgical History:  Procedure Laterality Date  . breast tumor    . PACEMAKER IMPLANT    . TONSILLECTOMY     Family History  Problem Relation Age of Onset  . Cancer Mother   . Diabetes Mother   . Heart disease Father    Social History:   reports that she has never smoked. She has never used smokeless tobacco. She reports that she does not drink alcohol and does not use drugs.  Medications  Current Facility-Administered Medications:  .  apixaban (ELIQUIS) tablet 5 mg, 5 mg, Oral, BID, Ghimire, Henreitta Leber, MD, 5 mg at 04/03/21 2250 .  bisacodyl (DULCOLAX) EC tablet 5 mg, 5 mg, Oral, Daily PRN, Tat, David, MD, 5 mg at 03/17/21 1707 .  Chlorhexidine Gluconate Cloth 2 % PADS 6 each, 6 each, Topical, Daily, Tat, David, MD, 6 each at 04/03/21 0941 .  Darbepoetin Alfa (ARANESP) injection 60 mcg, 60 mcg, Subcutaneous, Q Sat-1800, Ghimire, Henreitta Leber, MD, 60 mcg at 03/28/21 1737 .  dextrose 50 % solution 0-50 mL, 0-50 mL, Intravenous, PRN, Tat, David, MD .  ezetimibe (ZETIA) tablet 10 mg, 10 mg, Oral, Daily, Tat, David, MD, 10 mg at 04/03/21 0941 .  HYDROmorphone (DILAUDID) injection 0.5 mg, 0.5 mg, Intravenous, Q4H PRN, Elgergawy, Silver Huguenin, MD, 0.5 mg at 04/02/21 2330 .  insulin aspart (novoLOG) injection 0-15 Units, 0-15 Units, Subcutaneous, TID WC, Orson Eva, MD, 2 Units at 04/03/21 1730 .  insulin aspart (novoLOG) injection 0-5 Units, 0-5 Units, Subcutaneous, QHS, Tat, Shanon Brow, MD, 3 Units at  03/24/21 2140 .  insulin glargine (LANTUS) injection 20 Units, 20 Units, Subcutaneous, Daily, Elgergawy, Silver Huguenin, MD, 20 Units at 04/03/21 [phone] .  ipratropium (ATROVENT) nebulizer solution 0.5 mg, 0.5 mg, Nebulization, Q6H PRN, Tat, David, MD, 0.5 mg at 03/27/21 1846 .  levalbuterol (XOPENEX) nebulizer solution 0.63 mg, 0.63 mg, Nebulization, Q6H PRN, Tat, David, MD .  multivitamin with minerals tablet 1 tablet, 1 tablet, Oral, Daily,  Elgergawy, Silver Huguenin, MD, 1 tablet at 04/03/21 0941 .  ondansetron (ZOFRAN) tablet 4 mg, 4 mg, Oral, Q6H PRN, 4 mg at 03/29/21 0941 **OR** ondansetron (ZOFRAN) injection 4 mg, 4 mg, Intravenous, Q6H PRN, Tat, David, MD, 4 mg at 03/19/21 2059 .  oxyCODONE (Oxy IR/ROXICODONE) immediate release tablet 5 mg, 5 mg, Oral, Q4H PRN, Tat, David, MD, 5 mg at 04/01/21 2147 .  senna-docusate (Senokot-S) tablet 1 tablet, 1 tablet, Oral, QHS PRN, Orson Eva, MD, 1 tablet at 03/17/21 2116 .  sodium bicarbonate tablet 1,300 mg, 1,300 mg, Oral, TID, Elgergawy, Silver Huguenin, MD, 1,300 mg at 04/03/21 2250 .  sodium chloride flush (NS) 0.9 % injection 3 mL, 3 mL, Intravenous, Q12H, Tat, David, MD, 3 mL at 04/03/21 2251 .  sodium chloride flush (NS) 0.9 % injection 5 mL, 5 mL, Intracatheter, Q8H, Suttle, Dylan J, MD, 5 mL at 04/03/21 2250 .  traZODone (DESYREL) tablet 25 mg, 25 mg, Oral, QHS PRN, Tat, David, MD, 25 mg at 04/03/21 2253   Current Outpatient Medications  Medication Instructions  . apixaban (ELIQUIS) 5 mg, Oral, 2 times daily  . ezetimibe (ZETIA) 10 mg, Oral, Daily  . furosemide (LASIX) 20 mg, Oral  . glipiZIDE (GLUCOTROL XL) 5 mg, Oral, Daily with breakfast  . insulin glargine (LANTUS) 50 Units, Daily at bedtime  . insulin NPH-regular (NOVOLIN 70/30) (70-30) 100 UNIT/ML injection 10 Units, 3 times daily after meals   Exam: Current vital signs: BP 126/65 (BP Location: Right Arm)   Pulse 65   Temp 98.7 F (37.1 C) (Oral)   Resp 16   Ht 5\' 6"  (1.676 m)   Wt 89.7 kg   SpO2 97%   BMI 31.92 kg/m  Vital signs in last 24 hours: Temp:  [98 F (36.7 C)-98.8 F (37.1 C)] 98.7 F (37.1 C) (04/09 0802) Pulse Rate:  [65-71] 65 (04/09 0802) Resp:  [16-19] 16 (04/09 0802) BP: (123-159)/(61-80) 126/65 (04/09 0802) SpO2:  [97 %-100 %] 97 % (04/09 0802)  GENERAL: Awake, alert in no acute distress Head: Normocephalic and atraumatic without obvious abnormality EENT: No OP obstruction, normal  conjunctiva LUNGS - Normal respiratory effort. Non-labored breathing CV: Regular rate and rhythm on cardiac monitor ABDOMEN: Soft, nontender Ext: warm, well perfused, without obvious abnormality  NEURO:  Mental Status: Awake, alert, and oriented to person, place, year, age, month, and situation. She is able to give a clear and coherent history of present illness. Speech/Language: speech is intact without dysarthria or aphasia.  Naming, repetition, fluency, and comprehension intact. No neglect is noted.  Follows all commands./  Cranial Nerves:  II: PERRL 3 mm/brisk. Left visual field defect noted on examination.  III, IV, VI: EOMI. Lid elevation full and symmetric.  V: Sensation is intact to light touch and symmetrical to face.  VII: Face is symmetric resting and smiling. VIII: Hearing intact to voice IX, X: Palate elevation is symmetric. Phonation normal.  XI: Normal sternocleidomastoid and trapezius muscle strength XII: Tongue protrudes midline. Motor: 5/5 strength present in bilateral upper extremities with antigravity movement and without  vertical drift. Left upper extremity slightly stronger than right at the biceps and triceps. Lower extremities with RLE stronger than LLE. RLE with antigravity movement but with vertical drift and 4/5 strength, LLE with increased spastic movements and more difficulty sustaining antigravity movement. LLE 3/5 strength.  Tone is normal. Bulk is normal.  Sensation: Intact to light touch bilaterally in all four extremities. No extinction to DSS present.  Coordination: FTN intact bilaterally. HKS unable to assess on LLE due to weakness, no ataxia on RLE. No pronator drift. DTRs: 1+ and symmetric throughout.  Gait- deferred  NIHSS: 1a Level of Conscious.: 0 1b LOC Questions: 0 1c LOC Commands: 0 2 Best Gaze: 0 3 Visual: 2 4 Facial Palsy: 0 5a Motor Arm - left: 0 5b Motor Arm - Right: 0 6a Motor Leg - Left: 1 6b Motor Leg - Right: 2 7 Limb Ataxia:  0 8 Sensory: 0 9 Best Language: 0 10 Dysarthria: 0 11 Extinct. and Inatten.: 0 TOTAL: 3  Labs I have reviewed labs in epic and the results pertinent to this consultation are: CBC    Component Value Date/Time   WBC 9.1 04/03/2021 0113   RBC 2.93 (L) 04/03/2021 0113   HGB 8.8 (L) 04/03/2021 0113   HCT 29.1 (L) 04/03/2021 0113   PLT 450 (H) 04/03/2021 0113   MCV 99.3 04/03/2021 0113   MCH 30.0 04/03/2021 0113   MCHC 30.2 04/03/2021 0113   RDW 19.4 (H) 04/03/2021 0113   LYMPHSABS 2.0 03/16/2021 1300   MONOABS 0.5 03/16/2021 1300   EOSABS 0.0 03/16/2021 1300   BASOSABS 0.0 03/16/2021 1300   CMP     Component Value Date/Time   NA 138 04/04/2021 0725   K 3.9 04/04/2021 0725   CL 97 (L) 04/04/2021 0725   CO2 31 04/04/2021 0725   GLUCOSE 113 (H) 04/04/2021 0725   BUN 32 (H) 04/04/2021 0725   CREATININE 2.59 (H) 04/04/2021 0725   CALCIUM 8.9 04/04/2021 0725   PROT 5.7 (L) 03/26/2021 0055   ALBUMIN 1.8 (L) 03/26/2021 0055   AST 27 03/26/2021 0055   ALT 200 (H) 03/26/2021 0055   ALKPHOS 39 03/26/2021 0055   BILITOT 1.0 03/26/2021 0055   GFRNONAA 19 (L) 04/04/2021 0725   GFRAA >90 01/17/2013 1915   Lab Results  Component Value Date   HGBA1C 9.1 (H) 03/16/2021   Imaging I have reviewed the images obtained: CT-scan of the brain: 1. No intracranial hemorrhage or mass effect. 2. Multiple subacute to chronic infarcts within the right cerebral and left cerebellar hemispheres. MRI recommended for better temporal characterization. 3. Chronic microvascular ischemia.  Echocardiogram: 1. Left ventricular ejection fraction, by estimation, is 25 to 30%. The left ventricle has normal function. The left ventricle demonstrates global hypokinesis. There is mild left ventricular hypertrophy. Left ventricular diastolic parameters are consistent with Grade II diastolic dysfunction (pseudonormalization). Elevated left atrial pressure.  2. RV poorly visualized, grossly appears enlarged with  decreased systolic function. . Right ventricular systolic function was not well visualized. The right ventricular size is not well visualized.  3. Left atrial size was severely dilated.  4. The mitral valve is normal in structure. No evidence of mitral valve  regurgitation. No evidence of mitral stenosis.  5. The aortic valve is tricuspid. There is mild calcification of the  aortic valve. There is mild thickening of the aortic valve. Aortic valve  regurgitation is not visualized. No aortic stenosis is present.  6. Mild pulmonary HTN, PASP is 36 mmHg.  7. The inferior vena cava is normal in size with greater than 50% respiratory variability, suggesting right atrial pressure of 3 mmHg.   Assessment: 70 year old female with history as above who has had multiple episodes of syncope while inpatient with CT imaging concerning for possible subacute strokes. - Examination reveals slight right upper extremity weakness compared to left (previous stroke with initial right sided weakness with reported resolution) and left hemianopsia. Unable to assess if left visual field deficit is new or chronic because patient denies left visual field deficit.  - Stroke risk factors: age, gender, history of strokes, diabetes mellitus, anemia, hypertension, atrial fibrillation, and intermittent changes in Eliquis  - CT imaging with subacute to chronic infarcts of the right cerebral and left cerebellar hemispheres. Known history of strokes in May 2021. MRI brain pending for further evaluation (delay due to permanent pacemaker in place).  Impression: History of ischemic stroke Syncope Subacute strokes on CT imaging  Recommendations: - Lipid panel; on home Zetia 10 mg daily. Goal LDL < 70. Initiate statin therapy if LDL > 70 (recheck LFTs prior to initiation)  - Elevated A1c, recommend glucose control  - MRA of the brain without contrast for vessel imaging  - Frequent neuro checks - Carotid dopplers - Prophylactic  therapy- On home Eliquis 5 mg BID, continue AC - Risk factor modification - Telemetry monitoring - PT consult, OT consult, Speech consult - Stroke team to follow  Pt seen by NP/Neuro and later by MD. Note/plan to be edited by MD as needed.  Anibal Henderson, AGAC-NP [facility] Pager: [phone]  Attending Neurohospitalist Addendum Patient seen and examined with APP/Resident. Agree with the history and physical as documented above. Agree with the plan as documented, which I helped formulate. I have independently reviewed the chart, obtained history, review of systems and examined the patient.I have personally reviewed pertinent head/neck/spine imaging (CT/MRI). Please feel free to call with any questions.  -- Amie [place], MD Neurologist [facility] Pager: [phone]

## 2021-04-04 NOTE — Progress Notes (Signed)
VASCULAR LAB    Carotid duplex has been performed.  See CV proc for preliminary results.   Shali Vesey, RVT 04/04/2021, 1:38 PM

## 2021-04-04 NOTE — Progress Notes (Signed)
PROGRESS NOTE        PATIENT DETAILS Name: Tracey Bryant Age: 70 y.o. Sex: female Date of Birth: 09-Mar-1951 Admit Date: 03/16/2021 Admitting Physician Deatra James, MD [id number], Arlyn Leak, FNP  Brief Narrative: Patient is a 70 y.o. female with past medical history of DM-2, HTN, A. fib, chronic systolic heart failure, s/p permanent pacemaker placement-presented to the ED on 3/21  with nausea/vomiting/abdominal pain-further evaluation revealed acute calculus cholecystitis.  Hospital course complicated by AKI, multifactorial anemia, volume overload, age indeterminate left DVT, multiple syncopal episodes.  See below for further details  Significant events: 3/20>> ED visit for epistaxis 3/21>> admit to [facility] for acute calculus cholecystitis 3/24>> transfer to [facility] for percutaneous gallbladder drain placement 4/2>> left leg DVT-severe anemia-after discussion risk/benefits-started on IV heparin 4/3>>syncope while working with PT 4/7-4/8>> very short duration syncopal events (few seconds) when working with PT/OT.  Telemetry negative.  PPM interrogated-no arrhythmia/pauses.  Orthostatic vital signs during these episodes done by PT were negative  Significant studies: 3/21>> A1c 9.1 3/22 >> CT abdomen/pelvis: Cholelithiasis, bilateral small pleural effusions, ascites with anasarca 3/23>> RUQ ultrasound: Acute calculus cholecystitis 3/23>> Echo: EF 25-30%, decreased RV systolic function 5/91>> mild central pulmonary vascular congestion. 4/2>> lower extremity Doppler: Age indeterminate DVT left popliteal vein. 4/8>> CT head: Multiple subacute to chronic infarcts within the right cerebral/left cerebellar hemispheres.  Antimicrobial therapy: Zosyn: 3/22>>3/31  Microbiology data: 3/22>> urine culture: E. Coli 3/22>> blood culture: No growth 3/24>> gallbladder/bile culture: No growth  Procedures : 3/24>> CT-guided cholecystostomy tube placement  Consults: IR,  general surgery, nephrology, gastroenterology, neurology  DVT Prophylaxis : Eliquis  Subjective: Feels better-no further syncopal spells this morning.  She was able to sit up without any issues, able to raise both hands without any issues.  Assessment/Plan: Severe sepsis due to acute calculus cholecystitis and UTI: Sepsis physiology has resolved-culture data as above- completed 10 days of Zosyn on 3/31.   Acute calculus cholecystitis:evaluated by general surgery at [facility]-not a surgical candidate-hence percutaneous cholecystostomy tube placed.  IR following.  Transaminitis: Multifactorial-probably due to sepsis/acute cholecystitis-downtrending-continue to follow periodically.  Acute on chronic systolic/diastolic heart failure: Volume status has improved significantly-she is > 23 L negative balance.  Very mild lower extremity edema persists-but due to postural syncope-holding all diuretics for now.  AKI on CKD stage IIIa: AKI likely hemodynamically mediated-gradually improving-creatinine downtrending but has somewhat plateaued range.  Volume status is markedly improved over the past few days.  Syncope: Occurred on 4/3 while working with physical therapy-and again on 4/7 x 2-(initial episode when standing up with PT-second episode while in bed).  Subsequently had numerous episodes on 4/8-even with raising her hands.  These episodes last just a few seconds and appear only to be associated with change in posture and raising hands (but multiple orthostatic vital signs were negative).  Telemetry has been negative-after discussion with cardiology navigator-pacemaker was interrogated on 4/7-no pauses or arrhythmias were seen.  Subsequently neurology curbsided at patient's bedside (Dr. Prudence Davidson suspicion that this could be functional given exam findings.  Neurology recommended pursuing EEG (pending) and CT head-which now shows possible small CVAs-which I suspect is not related to these syncopal episodes.   Out of caution-and lack of significant edema-have held diuretics-she did get gentle hydration yesterday.  Have asked nursing staff to continue to maintain thigh-high TED hose.  ?  Acute CVA:  CT head as above-patient was off anticoagulation during the earlier part of hospital stay due to severe anemia-and epistaxis on presentation [facility]).  She has no focal neurological deficits.  Her pacemaker appears to be MRI compatible-however due to lack of staff-MRI brain will not be able to be done until 4/11 (RN spoke with radiology).  Will get bilateral carotid Dopplers, lipid panel.  She is already on Eliquis (history of PAF) for the past several days.  Have formally consulted neurology.  Acute urinary retention: Foley catheter placed on 3/26-started Flomax on 3/30-successful voiding trial on 4/2-no longer with Foley catheter.  No longer on Flomax.  Hyponatremia: Due to hypervolemia-resolved with diuretics.  Anemia: Multifactorial-some component of acute blood loss due to epistaxis that occurred 1 day prior to this hospitalization-on top of anemia due to chronic disease/critical illness.  Also FOBT positive-no overt GI blood loss.  Is a [name] continues to refuse blood transfusion-she confirms that she will refuse blood-even in the face of life-threatening situation.  Hemoglobin levels are slowly improving with IV iron (completed) and weekly Aranesp.  Continue to monitor CBC periodically.   Left lower leg DVT: Age indeterminate-but suspect must have occurred this admission as she is maintained on Eliquis-unfortunately Eliquis was discontinued as patient had epistaxis-and given the severity of anemia.  After extensive discussion with patient/son-regarding risk/benefits-IV heparin was started on 4/2 and subsequently transitioned to SQ Lovenox on 4/4.  Since hemoglobin has stabilized-has been transitioned to Eliquis.   PAF: Appears to be in sinus rhythm-Eliquis initially on hold due to  severity of anemia and recent history of epistaxis-however now due to diagnosis of DVT-on therapeutic anticoagulation.  History of CVA: Nonfocal exam-difficult situation-Eliquis resumed.  HLD: On Zetia  DM-2: CBG stable-continue Lantus 20 units daily and SSI.  Recent Labs    04/03/21 2059 04/04/21 0122 04/04/21 0801  GLUCAP 240* 169* 106*    Obesity: Estimated body mass index is 31.92 kg/m as calculated from the following:   Height as of this encounter: 5\' 6"  (1.676 m).   Weight as of this encounter: 89.7 kg.   Debility/deconditioning: Due to acute illness-PT/OT recommending CIR-however probably will require SNF per notes from CIR.  Diet: Diet Order            Diet heart healthy/carb modified Room service appropriate? Yes; Fluid consistency: Thin; Fluid restriction: 1500 mL Fluid  Diet effective now                  Code Status: Full code  Family Communication: Son-[phone]-spoke with son on 4/8-called on 4/9-unable to leave voicemail-as mailbox not set up yet  Disposition Plan: Status is: Inpatient  Remains inpatient appropriate because:Inpatient level of care appropriate due to severity of illness   Dispo: The patient is from: Home              Anticipated d/c is to: SNF              Patient currently is not medically stable to d/c.   Difficult to place patient No    Barriers to Discharge: Possible CVA on CT head-awaiting MRI BRAIN-neuro work-up.  Antimicrobial agents: Anti-infectives (From admission, onward)   Start     Dose/Rate Route Frequency Ordered Stop   03/26/21 2200  piperacillin-tazobactam (ZOSYN) IVPB 3.375 g        3.375 g 12.5 mL/hr over 240 Minutes Intravenous Every 12 hours 03/26/21 1137 03/27/21 0158   03/18/21 2200  piperacillin-tazobactam (ZOSYN) IVPB 3.375 g  Status:  Discontinued        3.375 g 12.5 mL/hr over 240 Minutes Intravenous Every 12 hours 03/18/21 1201 03/26/21 1137   03/17/21 1400  piperacillin-tazobactam (ZOSYN)  IVPB 3.375 g  Status:  Discontinued        3.375 g 12.5 mL/hr over 240 Minutes Intravenous Every 8 hours 03/17/21 1246 03/18/21 1201       Time spent: 25- minutes-Greater than 50% of this time was spent in counseling, explanation of diagnosis, planning of further management, and coordination of care.  MEDICATIONS: Scheduled Meds: . apixaban  5 mg Oral BID  . Chlorhexidine Gluconate Cloth  6 each Topical Daily  . darbepoetin (ARANESP) injection - NON-DIALYSIS  60 mcg Subcutaneous Q Sat-1800  . ezetimibe  10 mg Oral Daily  . insulin aspart  0-15 Units Subcutaneous TID WC  . insulin aspart  0-5 Units Subcutaneous QHS  . insulin glargine  20 Units Subcutaneous Daily  . multivitamin with minerals  1 tablet Oral Daily  . sodium bicarbonate  1,300 mg Oral TID  . sodium chloride flush  3 mL Intravenous Q12H  . sodium chloride flush  5 mL Intracatheter Q8H   Continuous Infusions:  PRN Meds:.bisacodyl, dextrose, HYDROmorphone (DILAUDID) injection, ipratropium, levalbuterol, ondansetron **OR** ondansetron (ZOFRAN) IV, oxyCODONE, senna-docusate, traZODone   PHYSICAL EXAM: Vital signs: Vitals:   04/03/21 2014 04/03/21 2321 04/04/21 0414 04/04/21 0802  BP: 126/61 (!) 145/61 (!) 145/65 126/65  Pulse: 70 68 69 65  Resp: 19 18 19 16   Temp: 98 F (36.7 C) 98.1 F (36.7 C) 98 F (36.7 C) 98.7 F (37.1 C)  TempSrc: Axillary Axillary Axillary Oral  SpO2: 100% 100% 97% 97%  Weight:      Height:       Filed Weights   03/31/21 0731 04/01/21 0357 04/02/21 0400  Weight: 96.1 kg 89.5 kg 89.7 kg   Body mass index is 31.92 kg/m.   Gen Exam:Alert awake-not in any distress HEENT:atraumatic, normocephalic Chest: B/L clear to auscultation anteriorly CVS:S1S2 regular Abdomen:soft non tender, non distended Extremities:no edema Neurology: Non focal Skin: no rash  I have personally reviewed following labs and imaging studies  LABORATORY DATA: CBC: Recent Labs  Lab 03/29/21 0208  03/30/21 0249 04/01/21 0049 04/03/21 0113  WBC 12.3* 13.1* 9.9 9.1  HGB 7.2* 7.5* 8.4* 8.8*  HCT 23.7* 25.6* 27.8* 29.1*  MCV 98.3 99.6 98.9 99.3  PLT 348 372 433* 450*    Basic Metabolic Panel: Recent Labs  Lab 03/29/21 0208 03/30/21 0249 03/31/21 0114 04/01/21 0049 04/02/21 0340 04/03/21 0113 04/04/21 0725  NA 138   < > 138 138 140 138 138  K 3.8   < > 4.6 4.3 4.6 4.5 3.9  CL 102   < > 98 94* 93* 93* 97*  CO2 27   < > 30 33* 37* 36* 31  GLUCOSE 173*   < > 142* 196* 71 164* 113*  BUN 55*   < > 50* 45* 42* 38* 32*  CREATININE 2.78*   < > 2.56* 2.40* 2.44* 2.81* 2.59*  CALCIUM 8.2*   < > 8.7* 8.9 9.4 9.2 8.9  MG 2.6*  --   --   --   --   --   --    < > = values in this interval not displayed.    GFR: Estimated Creatinine Clearance: 23.1 mL/min (A) (by C-G formula based on SCr of 2.59 mg/dL (H)).  Liver Function Tests: No results for input(s): AST, ALT, ALKPHOS,  BILITOT, PROT, ALBUMIN in the last 168 hours. No results for input(s): LIPASE, AMYLASE in the last 168 hours. No results for input(s): AMMONIA in the last 168 hours.  Coagulation Profile: No results for input(s): INR, PROTIME in the last 168 hours.  Cardiac Enzymes: No results for input(s): CKTOTAL, CKMB, CKMBINDEX, TROPONINI in the last 168 hours.  BNP (last 3 results) No results for input(s): PROBNP in the last 8760 hours.  Lipid Profile: No results for input(s): CHOL, HDL, LDLCALC, TRIG, CHOLHDL, LDLDIRECT in the last 72 hours.  Thyroid Function Tests: No results for input(s): TSH, T4TOTAL, FREET4, T3FREE, THYROIDAB in the last 72 hours.  Anemia Panel: No results for input(s): VITAMINB12, FOLATE, FERRITIN, TIBC, IRON, RETICCTPCT in the last 72 hours.  Urine analysis:    Component Value Date/Time   COLORURINE AMBER (A) 03/17/2021 1208   APPEARANCEUR CLOUDY (A) 03/17/2021 1208   LABSPEC 1.015 03/17/2021 1208   PHURINE 5.0 03/17/2021 1208   GLUCOSEU 50 (A) 03/17/2021 1208   HGBUR SMALL (A)  03/17/2021 1208   BILIRUBINUR NEGATIVE 03/17/2021 1208   KETONESUR NEGATIVE 03/17/2021 1208   PROTEINUR >=300 (A) 03/17/2021 1208   UROBILINOGEN 0.2 01/17/2013 1910   NITRITE NEGATIVE 03/17/2021 1208   LEUKOCYTESUR LARGE (A) 03/17/2021 1208    Sepsis Labs: Lactic Acid, Venous    Component Value Date/Time   LATICACIDVEN 1.4 03/21/2021 0805    MICROBIOLOGY: No results found for this or any previous visit (from the past 240 hour(s)).  RADIOLOGY STUDIES/RESULTS: CT HEAD WO CONTRAST  Result Date: 04/03/2021 CLINICAL DATA:  Dizziness EXAM: CT HEAD WITHOUT CONTRAST TECHNIQUE: Contiguous axial images were obtained from the base of the skull through the vertex without intravenous contrast. COMPARISON:  None. FINDINGS: Brain: There is encephalomalacia in the right inferior frontal, occipital and parietal lobes. There is an age indeterminate but likely chronic infarct left cerebellum. There is periventricular hypoattenuation compatible with chronic microvascular disease. No hemorrhage or extra-axial collection. No midline shift, other mass effect or hydrocephalus. Vascular: Atherosclerotic calcification of the vertebral and internal carotid arteries at the skull base. No abnormal hyperdensity of the major intracranial arteries or dural venous sinuses. Skull: The visualized skull base, calvarium and extracranial soft tissues are normal. Sinuses/Orbits: No fluid levels or advanced mucosal thickening of the visualized paranasal sinuses. No mastoid or middle ear effusion. The orbits are normal. IMPRESSION: 1. No intracranial hemorrhage or mass effect. 2. Multiple subacute to chronic infarcts within the right cerebral and left cerebellar hemispheres. MRI recommended for better temporal characterization. 3. Chronic microvascular ischemia. Electronically Signed   By: Ulyses Jarred M.D.   On: 04/03/2021 19:03     LOS: 19 days   Oren Binet, MD  [facility]    To contact the attending provider  between 7A-7P or the covering provider during after hours 7P-7A, please log into the web site [email or web address] and access using universal [place] password for that web site. If you do not have the password, please call the hospital operator.  04/04/2021, 10:53 AM

## 2021-04-05 ENCOUNTER — Inpatient Hospital Stay (HOSPITAL_COMMUNITY): Payer: Medicare HMO

## 2021-04-05 DIAGNOSIS — I6621 Occlusion and stenosis of right posterior cerebral artery: Secondary | ICD-10-CM

## 2021-04-05 DIAGNOSIS — E0810 Diabetes mellitus due to underlying condition with ketoacidosis without coma: Secondary | ICD-10-CM | POA: Diagnosis not present

## 2021-04-05 LAB — CBC
HCT: 30.4 % — ABNORMAL LOW (ref 36.0–46.0)
Hemoglobin: 9.2 g/dL — ABNORMAL LOW (ref 12.0–15.0)
MCH: 29.9 pg (ref 26.0–34.0)
MCHC: 30.3 g/dL (ref 30.0–36.0)
MCV: 98.7 fL (ref 80.0–100.0)
Platelets: 449 10*3/uL — ABNORMAL HIGH (ref 150–400)
RBC: 3.08 MIL/uL — ABNORMAL LOW (ref 3.87–5.11)
RDW: 17.8 % — ABNORMAL HIGH (ref 11.5–15.5)
WBC: 8.7 10*3/uL (ref 4.0–10.5)
nRBC: 0 % (ref 0.0–0.2)

## 2021-04-05 LAB — BASIC METABOLIC PANEL
Anion gap: 11 (ref 5–15)
BUN: 33 mg/dL — ABNORMAL HIGH (ref 8–23)
CO2: 29 mmol/L (ref 22–32)
Calcium: 9.1 mg/dL (ref 8.9–10.3)
Chloride: 98 mmol/L (ref 98–111)
Creatinine, Ser: 2.71 mg/dL — ABNORMAL HIGH (ref 0.44–1.00)
GFR, Estimated: 18 mL/min — ABNORMAL LOW (ref >60)
Glucose, Bld: 139 mg/dL — ABNORMAL HIGH (ref 70–99)
Potassium: 4 mmol/L (ref 3.5–5.1)
Sodium: 138 mmol/L (ref 135–145)

## 2021-04-05 LAB — GLUCOSE, CAPILLARY
Glucose-Capillary: 140 mg/dL — ABNORMAL HIGH (ref 70–99)
Glucose-Capillary: 100 mg/dL — ABNORMAL HIGH (ref 70–99)
Glucose-Capillary: 140 mg/dL — ABNORMAL HIGH (ref 70–99)
Glucose-Capillary: 86 mg/dL (ref 70–99)
Glucose-Capillary: 203 mg/dL — ABNORMAL HIGH (ref 70–99)

## 2021-04-05 LAB — LIPID PANEL
Cholesterol: 92 mg/dL (ref 0–200)
HDL: 37 mg/dL — ABNORMAL LOW (ref >40)
LDL Cholesterol: 39 mg/dL (ref 0–99)
Total CHOL/HDL Ratio: 2.5 RATIO
Triglycerides: 82 mg/dL (ref <150)
VLDL: 16 mg/dL (ref 0–40)

## 2021-04-05 IMAGING — CT CT HEAD W/O CM
4 series · 16 of 47 positions shown, 18 images · non-contrast
Comparison: Head CT from 2 days ago

CLINICAL DATA: Follow-up infarct

EXAM:
CT HEAD WITHOUT CONTRAST
TECHNIQUE: Contiguous axial images were obtained from the base of the skull
through the vertex without intravenous contrast.

[Series 3: head without · axial · non-contrast · 0.46mm/px · z∈[-107,+13]mm · 7 of 34 slices shown, 9 images]
[im 5/34  brain]
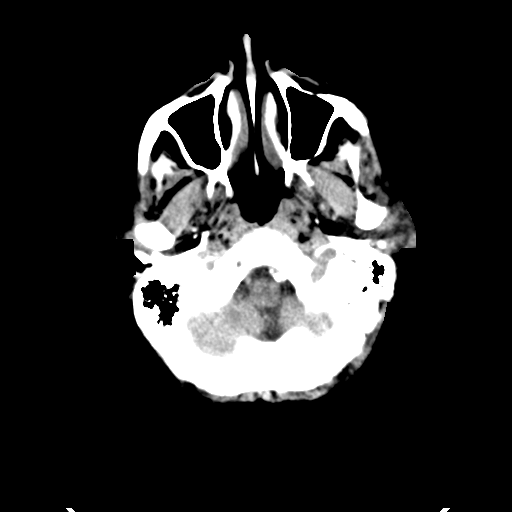
[im 5/34  bone]
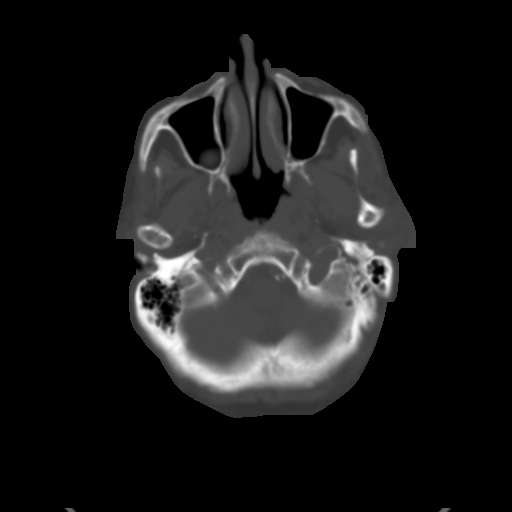
[im 9/34  brain]
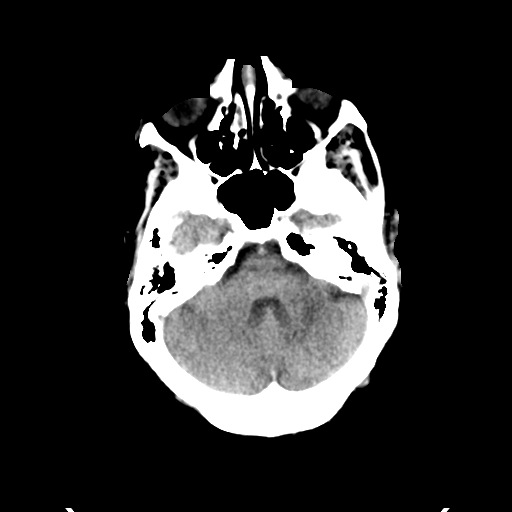
[im 13/34  brain]
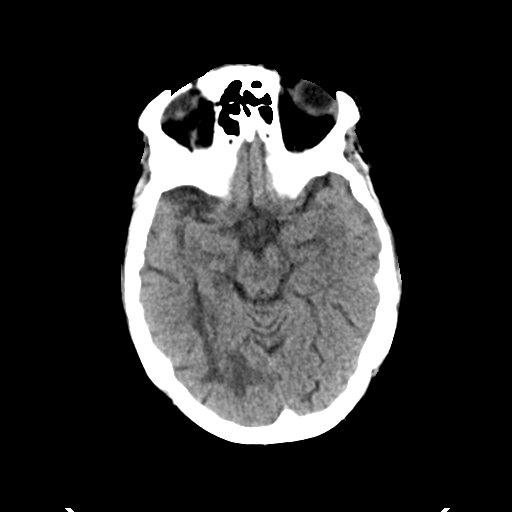
[im 17/34  brain]
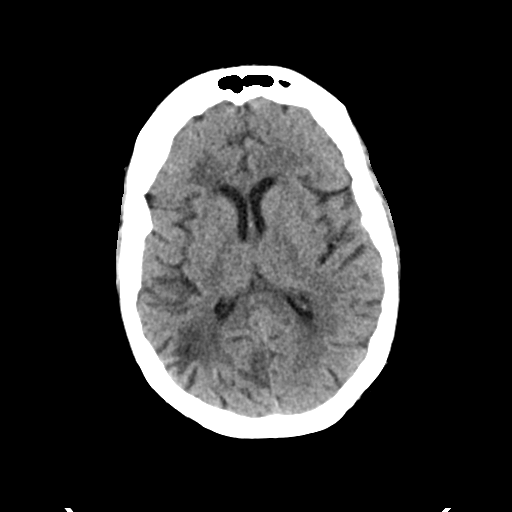
[im 21/34  brain]
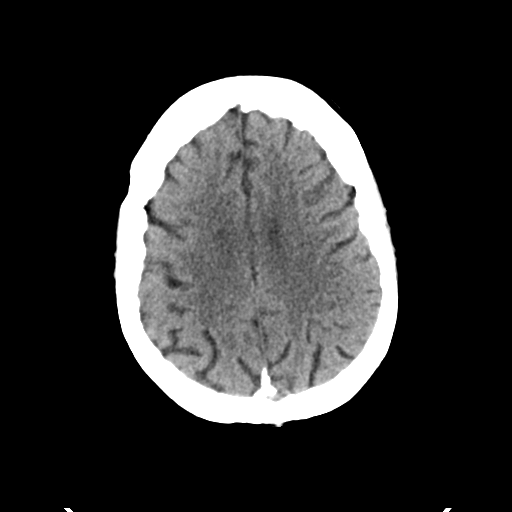
[im 21/34  bone]
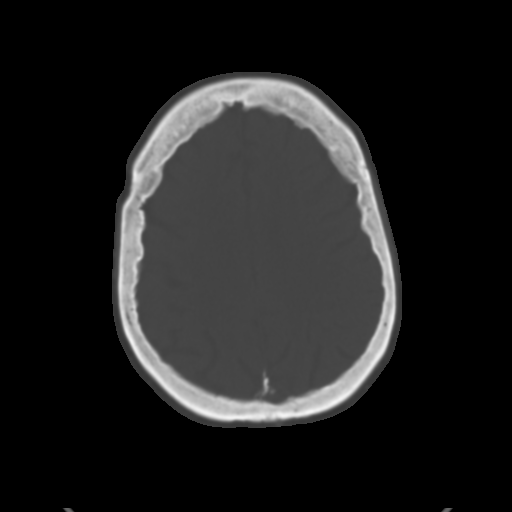
[im 25/34  brain]
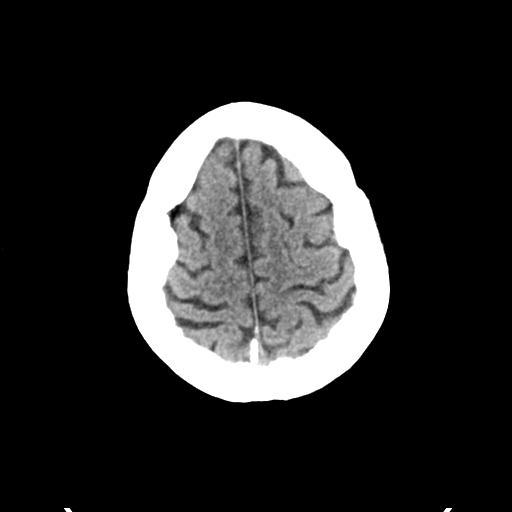
[im 29/34  brain]
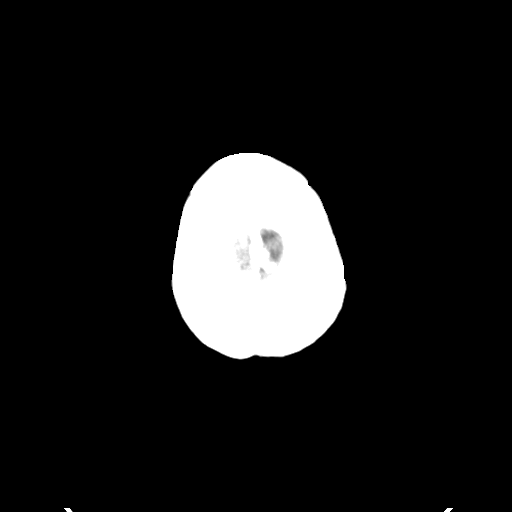

[Series 4: head bone · axial · 0.46mm/px · z∈[-111,-79]mm · 3 of 84 slices shown]
[im 9/84  bone]
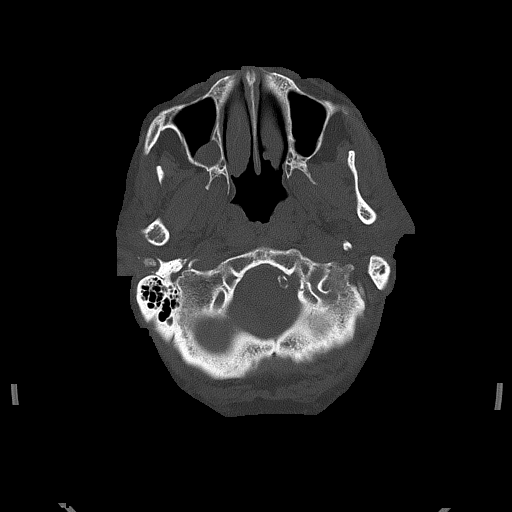
[im 17/84  bone]
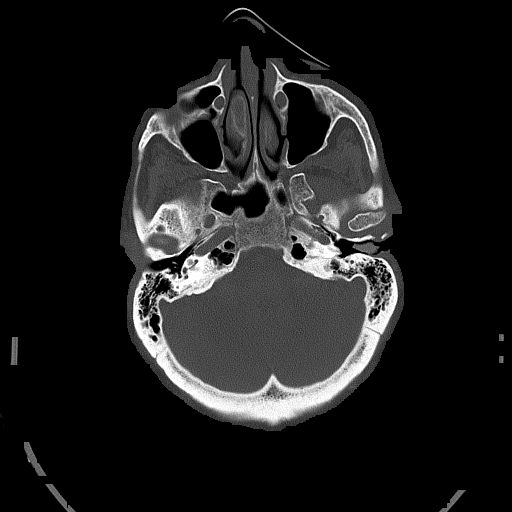
[im 25/84  bone]
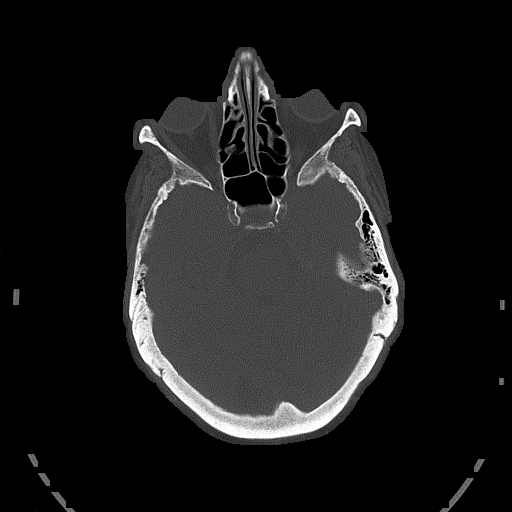

[Series 5: head without cor · coronal · non-contrast · 0.33mm/px · 3 of 67 slices shown]
[im 23/67  brain]
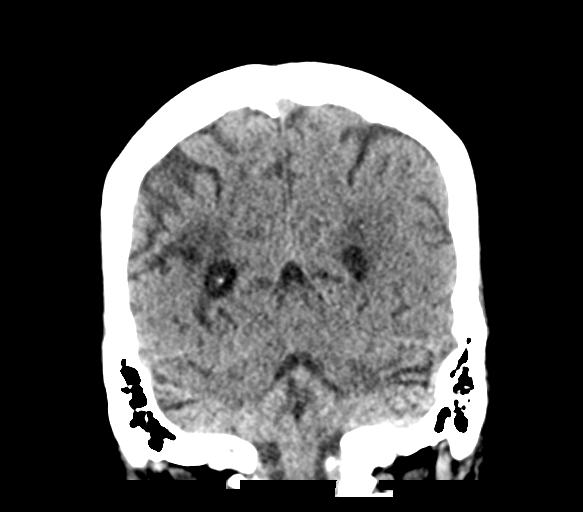
[im 30/67  brain]
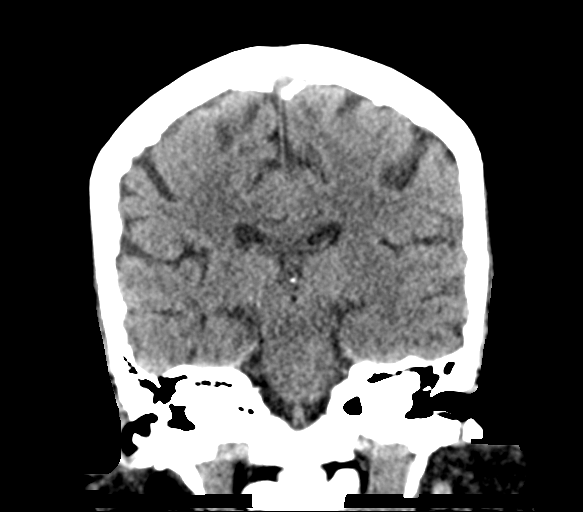
[im 37/67  brain]
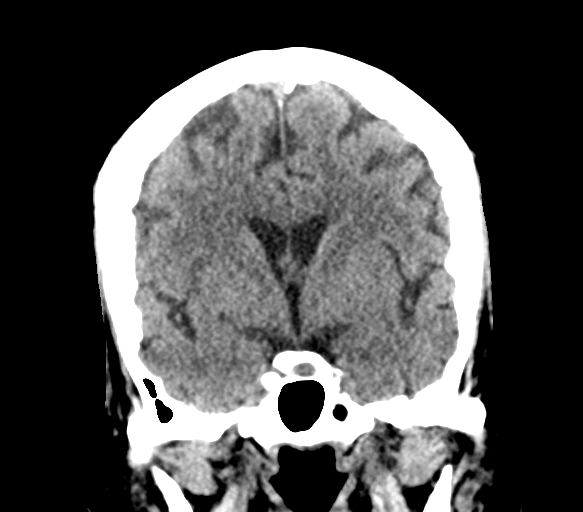

[Series 6: head without sag · sagittal · non-contrast · 0.33mm/px · 3 of 62 slices shown]
[im 21/62  brain]
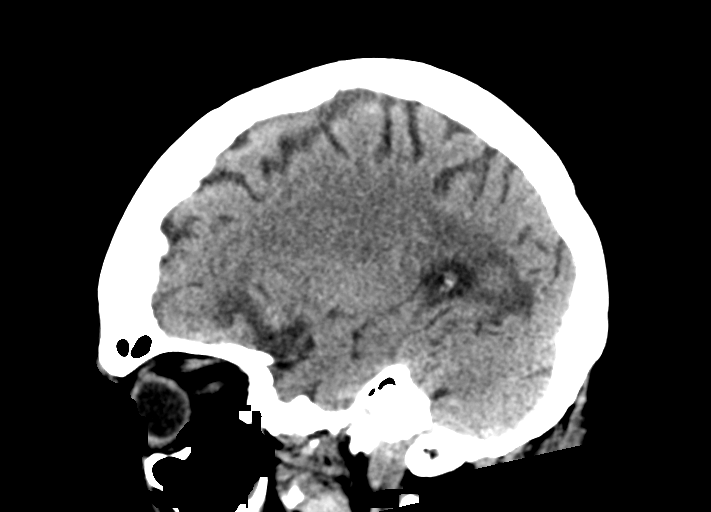
[im 31/62  brain]
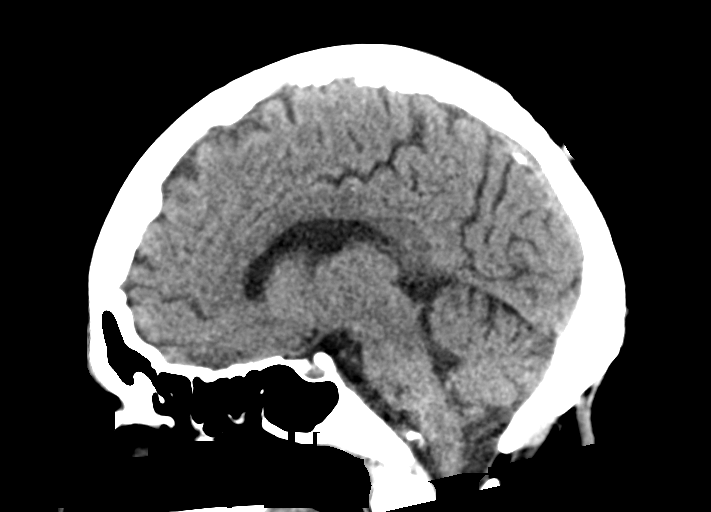
[im 41/62  brain]
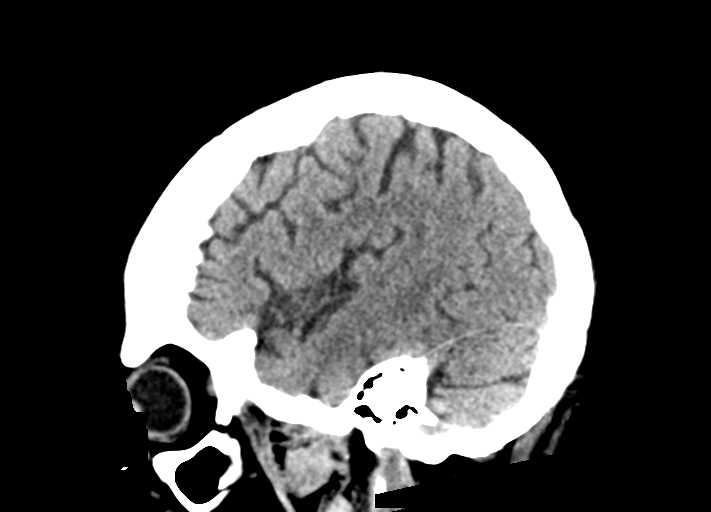

[16 of 47 positions shown; findings below may reference images not displayed]

FINDINGS: Brain: Recent appearing parasagittal right occipital parietal
cortically based infarct. Remote right parietal, right inferior
frontal, and right occipital pole infarcts. No acute hemorrhage,
hydrocephalus, or collection. Small remote bilateral cerebellar
infarcts better depicted on prior.

Vascular: Atheromatous calcification

Skull: Normal. Negative for fracture or focal lesion.

Sinuses/Orbits: No acute finding
IMPRESSION: 1. Recent right occipital parietal infarct without hemorrhage or
progression from 2 days ago.
2. Chronic right cerebral and cerebellar infarcts.

## 2021-04-05 MED ORDER — SODIUM BICARBONATE 650 MG PO TABS
650.0000 mg | ORAL_TABLET | Freq: Two times a day (BID) | ORAL | Status: DC
  Administered 2021-04-05: 650 mg via ORAL
  Filled 2021-04-05: qty 1

## 2021-04-05 NOTE — Progress Notes (Addendum)
  STROKE TEAM PROGRESS NOTE    Interval History   I have personally reviewed history of presenting illness with the patient, reviewed electronic medical records and imaging films in PACS.  Patient states she fell about 2 weeks ago and hit the back of her head and some bruising around her eye.  She denies any focal stroke like symptoms.  CT scan of the head shows subacute right PCA infarct.  Carotid ultrasound is unremarkable.  2D echo done on 03/19/2011 showed ejection fraction of 25 to 30% with global hypokinesis.  MRI scan cannot be done till Monday.      Pertinent Lab Work and Imaging     CT  Head WO IV Contrast : Subacute right occipitoparietal infarct without hemorrhage old left cerebellar infarct versus CSF collection space  Carotid ultrasound no significant extracranial stenosis   MRI Brain WO IV Contrast pending   Echocardiogram Complete 03/18/2021 ejection fraction of 25 to 30%.  Global hypokinesis.  No definite clot   Physical Examination   Constitutional: Pleasant elderly mildly obese Caucasian lady not in distress Cardiovascular: Normal RR Respiratory: No increased WOB   Mental status: Awake alert oriented to time place and person. Speech: Normal without aphasia or dysarthria Cranial nerves: Extraocular movements are full range without nystagmus.  Blinks to threat bilaterally.  Dense left homonymous hemianopsia.  Fundi not visualized. Motor: Normal bulk and tone. No drift.  Coordination: Preserved bilaterally Sensation normal bilaterally Reflexes: 1+ symmetric Gait: Deferred  NIHSS: 2  Assessment and Plan   Ms. Tracey Bryant is a 70 y.o. female w/pmh of type 2 diabetes mellitus, hypertension, atrial fibrillation on Eliquis, chronic systolic heart failure with permanent pacemaker, and multiple strokes- last in May 2021 with initial right hemiparesis that has since improved who presented to the hospital on 3/21 for acute calculus cholecystitis s/p percutaneous  drain placement complicated by sepsis s/p 10 days of antibiotic treatment. Hospitalization complicated by an acute kidney injury, anemia, volume overload, LLE DVT (age indeterminate), and multiple syncopal spells  #Stroke subacute right PCA and left cerebellum likely of cardioembolic etiology given cardiomyopathy.  Also suspicion for possible arrhythmias given history of multiple syncopal episodes. Recommend check MRI scan of the brain with MRA of the brain.  Etiology of her stroke is likely cardioembolic from an atrial fibrillation and low ejection fraction. Etiology of syncopal events is unclear doubt vertebrobasilar ischemia. Continue Eliquis for atrial fibrillation stroke prevention.  Mobilize out of bed.  Therapy consults. Check EEG for seizures. Greater than 50% time during this 35-minute visit was spent on counseling and coordination of care about her embolic strokes and answering questions.  Discussed with Dr. Judie Bonus, [facility] day # 20      To contact Stroke Continuity provider, please refer to [email or web address] After hours, contact General Neurology

## 2021-04-05 NOTE — Progress Notes (Signed)
Pt had syncopal event during bed bath. It lasted less than a minute. She was diaphoretic and had slurred speech for several minutes afterwards. Patient had difficulty tracking with her eyes immediately afterwards and was noted to have some bilateral eye jerking type movements while trying to follow my finger. She said she felt dizzy before the event but was unable to verbalize what was happening to her. BP 154/77 HR 69 MD aware.

## 2021-04-05 NOTE — Progress Notes (Deleted)
VS following syncopal event

## 2021-04-05 NOTE — Progress Notes (Signed)
Pt had 6 beat run of SVT. Pt reports feeling dizzy around the same time. Otherwise asymptomatic. MD notified.

## 2021-04-05 NOTE — Progress Notes (Addendum)
PROGRESS NOTE        PATIENT DETAILS Name: Tracey Bryant Age: 70 y.o. Sex: female Date of Birth: 1951-06-18 Admit Date: 03/16/2021 Admitting Physician Deatra James, MD JJH:ERDEYCX, Arlyn Leak, FNP  Brief Narrative: Patient is a 70 y.o. female with past medical history of DM-2, HTN, A. fib, chronic systolic heart failure, s/p permanent pacemaker placement-presented to the ED on 3/21  with nausea/vomiting/abdominal pain-further evaluation revealed acute calculus cholecystitis.  Hospital course complicated by AKI, multifactorial anemia, volume overload, age indeterminate left DVT, multiple syncopal episodes.  See below for further details  Significant events: 3/20>> ED visit for epistaxis 3/21>> admit to [facility] for acute calculus cholecystitis 3/24>> transfer to [facility] for percutaneous gallbladder drain placement 4/2>> left leg DVT-severe anemia-after discussion risk/benefits-started on IV heparin 4/3>>syncope while working with PT 4/7-4/8>> very short duration syncopal events (few seconds) when working with PT/OT.  Telemetry negative.  PPM interrogated-no arrhythmia/pauses.  Orthostatic vital signs during these episodes done by PT were negative  Significant studies: 3/21>> A1c 9.1 3/22 >> CT abdomen/pelvis: Cholelithiasis, bilateral small pleural effusions, ascites with anasarca 3/23>> RUQ ultrasound: Acute calculus cholecystitis 3/23>> Echo: EF 25-30%, decreased RV systolic function 4/48>> mild central pulmonary vascular congestion. 4/2>> lower extremity Doppler: Age indeterminate DVT left popliteal vein. 4/8>> CT head: Multiple subacute to chronic infarcts within the right cerebral/left cerebellar hemispheres.  Antimicrobial therapy: Zosyn: 3/22>>3/31  Microbiology data: 3/22>> urine culture: E. Coli 3/22>> blood culture: No growth 3/24>> gallbladder/bile culture: No growth  Procedures : 3/24>> CT-guided cholecystostomy tube placement  Consults:  IR,  general surgery, nephrology, gastroenterology, neurology  DVT Prophylaxis :  Eliquis  Subjective:  Patient in bed, appears comfortable, denies any headache, no fever, no chest pain or pressure, no shortness of breath , no abdominal pain. No new focal weakness.   Assessment/Plan:  Severe sepsis due to acute calculus cholecystitis and UTI: Sepsis physiology has resolved-culture data as above- completed 10 days of Zosyn on 3/31.   Acute calculus cholecystitis:evaluated by general surgery at [facility]-not a surgical candidate-hence percutaneous cholecystostomy tube placed.  IR following.  Transaminitis: Multifactorial-probably due to sepsis/acute cholecystitis-downtrending-continue to follow periodically.  Acute on chronic systolic/diastolic heart failure EF 25%: Volume status has improved significantly-she is > 23 L negative balance.  Very mild lower extremity edema persists-but due to postural syncope-holding all diuretics for now.  AKI on CKD stage IIIa: AKI likely hemodynamically mediated-gradually improving-creatinine downtrending but has somewhat plateaued range.  Volume status is markedly improved over the past few days.  Syncope: Occurred on 4/3 while working with physical therapy-and again on 4/7 x 2-(initial episode when standing up with PT-second episode while in bed).  Subsequently had numerous episodes on 4/8-even with raising her hands.  These episodes last just a few seconds and appear only to be associated with change in posture and raising hands (but multiple orthostatic vital signs were negative).  Telemetry has been negative-after discussion with cardiology navigator-pacemaker was interrogated on 4/7-no pauses or arrhythmias were seen.  Neurology not following the patient await MRI/MRA and EEG results, if -ve will get VQ, repeat TTE and Cards input as well.  ?  Acute CVA: CT head as above-patient was off anticoagulation during the earlier part of hospital stay due to severe anemia-and  epistaxis on presentation [facility]).  She has no focal neurological deficits.  Her pacemaker appears to be  MRI compatible-MRI/MRA will be done when staff available on 04/06/2021, on Eliquis, A1c needs improvement, LDL is stable, Neuro following.  Acute urinary retention: Foley catheter placed on 3/26-started Flomax on 3/30-successful voiding trial on 4/2-no longer with Foley catheter.  No longer on Flomax.  Hyponatremia: Due to hypervolemia-resolved with diuretics.  Anemia: Multifactorial-some component of acute blood loss due to epistaxis that occurred 1 day prior to this hospitalization-on top of anemia due to chronic disease/critical illness.  Also FOBT positive-no overt GI blood loss.  Is a [name] continues to refuse blood transfusion-she confirms that she will refuse blood-even in the face of life-threatening situation.  Hemoglobin levels are slowly improving with IV iron (completed) and weekly Aranesp.  Continue to monitor CBC periodically.   Left lower leg DVT: As per discussions between previous MD and son patient is now on Eliquis.   PAF: Appears to be in sinus rhythm-Eliquis initially on hold due to severity of anemia and recent history of epistaxis-however now due to diagnosis of DVT-on therapeutic anticoagulation.  History of CVA: Nonfocal exam-difficult situation-Eliquis resumed.  HLD: On Zetia  DM-2: CBG stable-continue Lantus 20 units daily and SSI.  Poor outpatient control due to hyperglycemia.  Recent Labs    04/04/21 2033 04/05/21 0445 04/05/21 0812  GLUCAP 268* 140* 100*   Lab Results  Component Value Date   HGBA1C 9.1 (H) 03/16/2021     Obesity: Estimated body mass index is 28.68 kg/m as calculated from the following:   Height as of this encounter: 5\' 6"  (1.676 m).   Weight as of this encounter: 80.6 kg.   Debility/deconditioning: Due to acute illness-PT/OT recommending CIR-however probably will require SNF per notes from  CIR.  Diet: Diet Order            Diet heart healthy/carb modified Room service appropriate? Yes; Fluid consistency: Thin; Fluid restriction: 1500 mL Fluid  Diet effective now                  Code Status: Full code  Family Communication: Son-Kevin-[phone]-spoke with son on 4/8-called on 4/9-unable to leave voicemail-as mailbox not set up yet  Disposition Plan: Status is: Inpatient  Remains inpatient appropriate because:Inpatient level of care appropriate due to severity of illness   Dispo: The patient is from: Home              Anticipated d/c is to: SNF              Patient currently is not medically stable to d/c.   Difficult to place patient No    Barriers to Discharge: Possible CVA on CT head-awaiting MRI BRAIN-neuro work-up.  Antimicrobial agents: Anti-infectives (From admission, onward)   Start     Dose/Rate Route Frequency Ordered Stop   03/26/21 2200  piperacillin-tazobactam (ZOSYN) IVPB 3.375 g        3.375 g 12.5 mL/hr over 240 Minutes Intravenous Every 12 hours 03/26/21 1137 03/27/21 0158   03/18/21 2200  piperacillin-tazobactam (ZOSYN) IVPB 3.375 g  Status:  Discontinued        3.375 g 12.5 mL/hr over 240 Minutes Intravenous Every 12 hours 03/18/21 1201 03/26/21 1137   03/17/21 1400  piperacillin-tazobactam (ZOSYN) IVPB 3.375 g  Status:  Discontinued        3.375 g 12.5 mL/hr over 240 Minutes Intravenous Every 8 hours 03/17/21 1246 03/18/21 1201       Time spent: 25- minutes-Greater than 50% of this time was spent in counseling, explanation of diagnosis, planning  of further management, and coordination of care.  MEDICATIONS: Scheduled Meds: . apixaban  5 mg Oral BID  . Chlorhexidine Gluconate Cloth  6 each Topical Daily  . darbepoetin (ARANESP) injection - NON-DIALYSIS  60 mcg Subcutaneous Q Sat-1800  . ezetimibe  10 mg Oral Daily  . insulin aspart  0-15 Units Subcutaneous TID WC  . insulin aspart  0-5 Units Subcutaneous QHS  . insulin  glargine  20 Units Subcutaneous Daily  . multivitamin with minerals  1 tablet Oral Daily  . sodium bicarbonate  1,300 mg Oral TID  . sodium chloride flush  3 mL Intravenous Q12H  . sodium chloride flush  5 mL Intracatheter Q8H   Continuous Infusions:  PRN Meds:.bisacodyl, dextrose, HYDROmorphone (DILAUDID) injection, ipratropium, levalbuterol, ondansetron **OR** ondansetron (ZOFRAN) IV, oxyCODONE, senna-docusate, traZODone   PHYSICAL EXAM: Vital signs: Vitals:   04/05/21 0331 04/05/21 0410 04/05/21 0546 04/05/21 0809  BP: (!) 145/75 (!) 154/77  (!) 132/58  Pulse: 72 69  68  Resp: 18 20  19   Temp: 98 F (36.7 C) 98 F (36.7 C)  97.8 F (36.6 C)  TempSrc: Oral Oral  Oral  SpO2: 99% 100%  99%  Weight:   80.6 kg   Height:       Filed Weights   04/01/21 0357 04/02/21 0400 04/05/21 0546  Weight: 89.5 kg 89.7 kg 80.6 kg   Body mass index is 28.68 kg/m.   Gen Exam:  Awake Alert, No new F.N deficits, Normal affect [place].AT,PERRAL Supple Neck,No JVD, No cervical lymphadenopathy appriciated.  Symmetrical Chest wall movement, Good air movement bilaterally, CTAB RRR,No Gallops, Rubs or new Murmurs, No Parasternal Heave +ve B.Sounds, Abd Soft, No tenderness, No organomegaly appriciated, No rebound - guarding or rigidity. No Cyanosis, Clubbing or edema, No new Rash or bruise   I have personally reviewed following labs and imaging studies  LABORATORY DATA: CBC: Recent Labs  Lab 03/30/21 0249 04/01/21 0049 04/03/21 0113 04/05/21 0034  WBC 13.1* 9.9 9.1 8.7  HGB 7.5* 8.4* 8.8* 9.2*  HCT 25.6* 27.8* 29.1* 30.4*  MCV 99.6 98.9 99.3 98.7  PLT 372 433* 450* 449*    Basic Metabolic Panel: Recent Labs  Lab 04/01/21 0049 04/02/21 0340 04/03/21 0113 04/04/21 0725 04/05/21 0034  NA 138 140 138 138 138  K 4.3 4.6 4.5 3.9 4.0  CL 94* 93* 93* 97* 98  CO2 33* 37* 36* 31 29  GLUCOSE 196* 71 164* 113* 139*  BUN 45* 42* 38* 32* 33*  CREATININE 2.40* 2.44* 2.81* 2.59* 2.71*   CALCIUM 8.9 9.4 9.2 8.9 9.1   Lab Results  Component Value Date   HGBA1C 9.1 (H) 03/16/2021   Lab Results  Component Value Date   CHOL 92 04/05/2021   HDL 37 (L) 04/05/2021   LDLCALC 39 04/05/2021   TRIG 82 04/05/2021   CHOLHDL 2.5 04/05/2021    GFR: Estimated Creatinine Clearance: 21 mL/min (A) (by C-G formula based on SCr of 2.71 mg/dL (H)).  Liver Function Tests: No results for input(s): AST, ALT, ALKPHOS, BILITOT, PROT, ALBUMIN in the last 168 hours. No results for input(s): LIPASE, AMYLASE in the last 168 hours. No results for input(s): AMMONIA in the last 168 hours.  Coagulation Profile: No results for input(s): INR, PROTIME in the last 168 hours.  Cardiac Enzymes: No results for input(s): CKTOTAL, CKMB, CKMBINDEX, TROPONINI in the last 168 hours.  BNP (last 3 results) No results for input(s): PROBNP in the last 8760 hours.  Lipid Profile: Recent  Labs    04/05/21 0034  CHOL 92  HDL 37*  LDLCALC 39  TRIG 82  CHOLHDL 2.5    Thyroid Function Tests: No results for input(s): TSH, T4TOTAL, FREET4, T3FREE, THYROIDAB in the last 72 hours.  Anemia Panel: No results for input(s): VITAMINB12, FOLATE, FERRITIN, TIBC, IRON, RETICCTPCT in the last 72 hours.  Urine analysis:    Component Value Date/Time   COLORURINE AMBER (A) 03/17/2021 1208   APPEARANCEUR CLOUDY (A) 03/17/2021 1208   LABSPEC 1.015 03/17/2021 1208   PHURINE 5.0 03/17/2021 1208   GLUCOSEU 50 (A) 03/17/2021 1208   HGBUR SMALL (A) 03/17/2021 1208   BILIRUBINUR NEGATIVE 03/17/2021 1208   KETONESUR NEGATIVE 03/17/2021 1208   PROTEINUR >=300 (A) 03/17/2021 1208   UROBILINOGEN 0.2 01/17/2013 1910   NITRITE NEGATIVE 03/17/2021 1208   LEUKOCYTESUR LARGE (A) 03/17/2021 1208    Sepsis Labs: Lactic Acid, Venous    Component Value Date/Time   LATICACIDVEN 1.4 03/21/2021 0805    MICROBIOLOGY: No results found for this or any previous visit (from the past 240 hour(s)).  RADIOLOGY  STUDIES/RESULTS: CT HEAD WO CONTRAST  Result Date: 04/05/2021 CLINICAL DATA:  Follow-up infarct EXAM: CT HEAD WITHOUT CONTRAST TECHNIQUE: Contiguous axial images were obtained from the base of the skull through the vertex without intravenous contrast. COMPARISON:  Head CT from 2 days ago FINDINGS: Brain: Recent appearing parasagittal right occipital parietal cortically based infarct. Remote right parietal, right inferior frontal, and right occipital pole infarcts. No acute hemorrhage, hydrocephalus, or collection. Small remote bilateral cerebellar infarcts better depicted on prior. Vascular: Atheromatous calcification Skull: Normal. Negative for fracture or focal lesion. Sinuses/Orbits: No acute finding IMPRESSION: 1. Recent right occipital parietal infarct without hemorrhage or progression from 2 days ago. 2. Chronic right cerebral and cerebellar infarcts. Electronically Signed   By: Monte Fantasia M.D.   On: 04/05/2021 07:07   CT HEAD WO CONTRAST  Result Date: 04/03/2021 CLINICAL DATA:  Dizziness EXAM: CT HEAD WITHOUT CONTRAST TECHNIQUE: Contiguous axial images were obtained from the base of the skull through the vertex without intravenous contrast. COMPARISON:  None. FINDINGS: Brain: There is encephalomalacia in the right inferior frontal, occipital and parietal lobes. There is an age indeterminate but likely chronic infarct left cerebellum. There is periventricular hypoattenuation compatible with chronic microvascular disease. No hemorrhage or extra-axial collection. No midline shift, other mass effect or hydrocephalus. Vascular: Atherosclerotic calcification of the vertebral and internal carotid arteries at the skull base. No abnormal hyperdensity of the major intracranial arteries or dural venous sinuses. Skull: The visualized skull base, calvarium and extracranial soft tissues are normal. Sinuses/Orbits: No fluid levels or advanced mucosal thickening of the visualized paranasal sinuses. No mastoid or  middle ear effusion. The orbits are normal. IMPRESSION: 1. No intracranial hemorrhage or mass effect. 2. Multiple subacute to chronic infarcts within the right cerebral and left cerebellar hemispheres. MRI recommended for better temporal characterization. 3. Chronic microvascular ischemia. Electronically Signed   By: Ulyses Jarred M.D.   On: 04/03/2021 19:03   VAS US CAROTID  Result Date: 04/04/2021 Carotid Arterial Duplex Study Indications:       Recurrent Syncope. Risk Factors:      Hypertension, Diabetes. Other Factors:     CHF, atrial fibrillation, pacemaker. Comparison Study:  No prior study on file. Performing Technologist: Sharion Dove RVS  Examination Guidelines: A complete evaluation includes B-mode imaging, spectral Doppler, color Doppler, and power Doppler as needed of all accessible portions of each vessel. Bilateral testing is considered an integral  part of a complete examination. Limited examinations for reoccurring indications may be performed as noted.  Right Carotid Findings: +----------+--------+--------+--------+------------------+------------------+           PSV cm/sEDV cm/sStenosisPlaque DescriptionComments           +----------+--------+--------+--------+------------------+------------------+ CCA Prox  46      6                                                    +----------+--------+--------+--------+------------------+------------------+ CCA Distal57      9                                 intimal thickening +----------+--------+--------+--------+------------------+------------------+ ICA Prox  57      12              calcific                             +----------+--------+--------+--------+------------------+------------------+ ICA Distal73      20                                                   +----------+--------+--------+--------+------------------+------------------+ ECA       94      13                                                    +----------+--------+--------+--------+------------------+------------------+ +----------+--------+-------+--------+-------------------+           PSV cm/sEDV cmsDescribeArm Pressure (mmHG) +----------+--------+-------+--------+-------------------+ [id number]                                         +----------+--------+-------+--------+-------------------+ +---------+--------+--+--------+-+ VertebralPSV cm/s29EDV cm/s6 +---------+--------+--+--------+-+  Left Carotid Findings: +----------+--------+--------+--------+------------------+------------------+           PSV cm/sEDV cm/sStenosisPlaque DescriptionComments           +----------+--------+--------+--------+------------------+------------------+ CCA Prox  61      12                                intimal thickening +----------+--------+--------+--------+------------------+------------------+ CCA Distal48      13                                intimal thickening +----------+--------+--------+--------+------------------+------------------+ ICA Prox  55      23              heterogenous                         +----------+--------+--------+--------+------------------+------------------+ ICA Distal48      19                                                   +----------+--------+--------+--------+------------------+------------------+  ECA       93      11                                                   +----------+--------+--------+--------+------------------+------------------+ +----------+--------+--------+--------+-------------------+           PSV cm/sEDV cm/sDescribeArm Pressure (mmHG) +----------+--------+--------+--------+-------------------+ [id number]                                          +----------+--------+--------+--------+-------------------+ +---------+--------+--+--------+-+ VertebralPSV cm/s34EDV cm/s8 +---------+--------+--+--------+-+   Summary: Right Carotid:  The extracranial vessels were near-normal with only minimal wall                thickening or plaque. Left Carotid: The extracranial vessels were near-normal with only minimal wall               thickening or plaque. Vertebrals:  Bilateral vertebral arteries demonstrate antegrade flow. Subclavians: Normal flow hemodynamics were seen in bilateral subclavian              arteries. *See table(s) above for measurements and observations.  Electronically signed by Antony Contras MD on 04/04/2021 at 2:22:12 PM.    Final      LOS: 61 days   Lala Lund, MD  [facility]  04/05/2021, 11:24 AM

## 2021-04-05 NOTE — Progress Notes (Signed)
Notified by RN that pt had syncopal event during bed bath. Was witnessed by RN.  RN reports:      It only lasted less than a minute, but she was diaphoretic and had slurred speech for several minutes afterwards. Also patient had difficulty tracking with her eyes immediately afterwards and was noted to have some bilateral eye jerking type movements while trying to follow my finger. She said she felt dizzy before the event but was unable to verbalize what was happening to her. BP 154/77 HR 69   Ordered STAT Head CT to rule out intracranial bleeding or other pathology. Reviewed CT head from 4/8 results.  Pt has MRI brain ordered and is pending.  Obtain EEG.  Neurology is following already

## 2021-04-06 ENCOUNTER — Inpatient Hospital Stay (HOSPITAL_COMMUNITY): Payer: Medicare HMO

## 2021-04-06 DIAGNOSIS — I6621 Occlusion and stenosis of right posterior cerebral artery: Secondary | ICD-10-CM | POA: Diagnosis not present

## 2021-04-06 DIAGNOSIS — E0810 Diabetes mellitus due to underlying condition with ketoacidosis without coma: Secondary | ICD-10-CM | POA: Diagnosis not present

## 2021-04-06 LAB — GLUCOSE, CAPILLARY
Glucose-Capillary: 79 mg/dL (ref 70–99)
Glucose-Capillary: 171 mg/dL — ABNORMAL HIGH (ref 70–99)
Glucose-Capillary: 122 mg/dL — ABNORMAL HIGH (ref 70–99)
Glucose-Capillary: 237 mg/dL — ABNORMAL HIGH (ref 70–99)

## 2021-04-06 LAB — CBC WITH DIFFERENTIAL/PLATELET
Abs Immature Granulocytes: 0.01 10*3/uL (ref 0.00–0.07)
Basophils Absolute: 0.1 10*3/uL (ref 0.0–0.1)
Basophils Relative: 1 %
Eosinophils Absolute: 0.6 10*3/uL — ABNORMAL HIGH (ref 0.0–0.5)
Eosinophils Relative: 7 %
HCT: 32.5 % — ABNORMAL LOW (ref 36.0–46.0)
Hemoglobin: 9.9 g/dL — ABNORMAL LOW (ref 12.0–15.0)
Immature Granulocytes: 0 %
Lymphocytes Relative: 28 %
Lymphs Abs: 2.4 10*3/uL (ref 0.7–4.0)
MCH: 29.7 pg (ref 26.0–34.0)
MCHC: 30.5 g/dL (ref 30.0–36.0)
MCV: 97.6 fL (ref 80.0–100.0)
Monocytes Absolute: 0.6 10*3/uL (ref 0.1–1.0)
Monocytes Relative: 7 %
Neutro Abs: 4.8 10*3/uL (ref 1.7–7.7)
Neutrophils Relative %: 57 %
Platelets: 480 10*3/uL — ABNORMAL HIGH (ref 150–400)
RBC: 3.33 MIL/uL — ABNORMAL LOW (ref 3.87–5.11)
RDW: 17.2 % — ABNORMAL HIGH (ref 11.5–15.5)
WBC: 8.5 10*3/uL (ref 4.0–10.5)
nRBC: 0 % (ref 0.0–0.2)

## 2021-04-06 LAB — COMPREHENSIVE METABOLIC PANEL
ALT: 21 U/L (ref 0–44)
AST: 19 U/L (ref 15–41)
Albumin: 2.3 g/dL — ABNORMAL LOW (ref 3.5–5.0)
Alkaline Phosphatase: 48 U/L (ref 38–126)
Anion gap: 9 (ref 5–15)
BUN: 30 mg/dL — ABNORMAL HIGH (ref 8–23)
CO2: 30 mmol/L (ref 22–32)
Calcium: 8.9 mg/dL (ref 8.9–10.3)
Chloride: 100 mmol/L (ref 98–111)
Creatinine, Ser: 2.45 mg/dL — ABNORMAL HIGH (ref 0.44–1.00)
GFR, Estimated: 21 mL/min — ABNORMAL LOW (ref >60)
Glucose, Bld: 83 mg/dL (ref 70–99)
Potassium: 3.6 mmol/L (ref 3.5–5.1)
Sodium: 139 mmol/L (ref 135–145)
Total Bilirubin: 0.6 mg/dL (ref 0.3–1.2)
Total Protein: 6.5 g/dL (ref 6.5–8.1)

## 2021-04-06 LAB — BRAIN NATRIURETIC PEPTIDE: B Natriuretic Peptide: 2116.9 pg/mL — ABNORMAL HIGH (ref 0.0–100.0)

## 2021-04-06 LAB — MAGNESIUM: Magnesium: 2.2 mg/dL (ref 1.7–2.4)

## 2021-04-06 IMAGING — NM NM PULMONARY VENT & PERF
7 series · 7 of 7 positions shown · non-contrast
Comparison: [DATE]

CLINICAL DATA: Chest pain, history of DVT

EXAM:
NUCLEAR MEDICINE PERFUSION LUNG SCAN
TECHNIQUE: Perfusion images were obtained in multiple projections after
intravenous injection of radiopharmaceutical.
Ventilation scans intentionally deferred if perfusion scan and chest
x-ray adequate for interpretation during COVID 19 epidemic.
RADIOPHARMACEUTICALS:  4.4 mCi [8X] MAA IV

[Series 1: ant/post perf · 4.14mm/px · 1 of 1 slices shown]
[im 1/1]
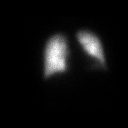

[Series 2: lao/rpo perf · 4.14mm/px · 1 of 1 slices shown (1 of 2)]
[im 1/1]
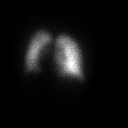

[Series 2: lao/rpo perf · 4.14mm/px · 1 of 1 slices shown (2 of 2)]
[im 1/1]
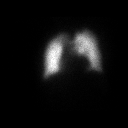

[Series 3: lpo/rao perf · 4.14mm/px · 1 of 1 slices shown (1 of 2)]
[im 1/1]
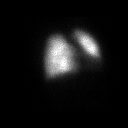

[Series 3: lpo/rao perf · 4.14mm/px · 1 of 1 slices shown (2 of 2)]
[im 1/1]
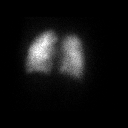

[Series 4: lt lat/rt lat perf · 4.14mm/px · 1 of 1 slices shown (1 of 2)]
[im 1/1]
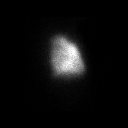

[Series 4: lt lat/rt lat perf · 4.14mm/px · 1 of 1 slices shown (2 of 2)]
[im 1/1]
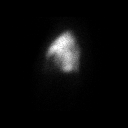

[7 of 7 positions shown; findings below may reference images not displayed]

FINDINGS: Planar images in multiple projections are obtained during the
perfusion phase of the exam. There are no perfusion defects to
suggest pulmonary embolus.
IMPRESSION: 1. No evidence of pulmonary embolus.

## 2021-04-06 IMAGING — MR MR HEAD W/O CM
7 of 10 series · 32 of 48 positions shown · non-contrast
Comparison: Prior head CT examinations [DATE] and earlier.

CLINICAL DATA: Dizziness, nonspecific.  Stroke, follow-up.

EXAM:
MRI HEAD WITHOUT CONTRAST
MRA HEAD WITHOUT CONTRAST
TECHNIQUE: Multiplanar, multiecho pulse sequences of the brain and surrounding
structures were obtained without intravenous contrast. Angiographic
images of the head were obtained using MRA technique without
contrast.

[Series 4: DWI · axial · 3.0mm · 1.09mm/px · z∈[-92,+53]mm · 8 of 100 slices shown (1 of 4)]
[im 1/100]
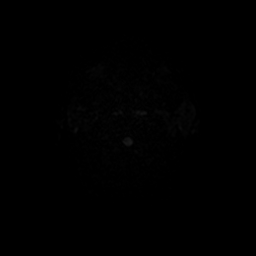
[im 12/100]
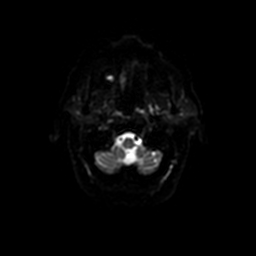
[im 34/100]
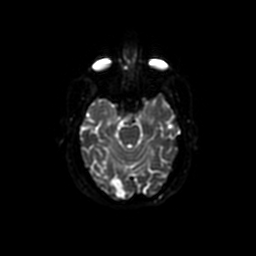
[im 45/100]
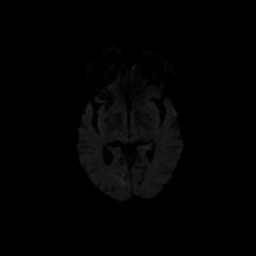
[im 56/100]
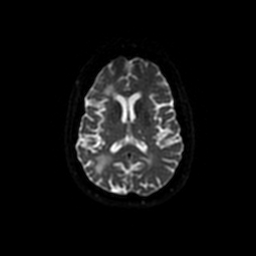
[im 67/100]
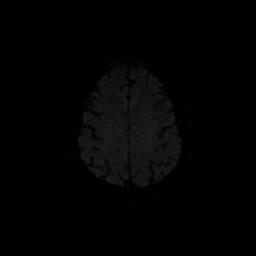
[im 89/100]
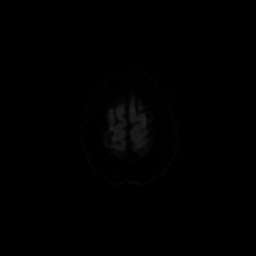
[im 100/100]
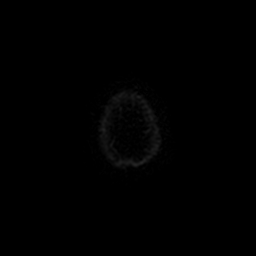

[Series 5: DWI · coronal · 5.0mm · 1.09mm/px · 7 of 72 slices shown (2 of 4)]
[im 1/72]
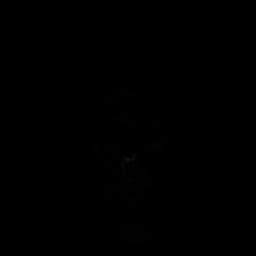
[im 12/72]
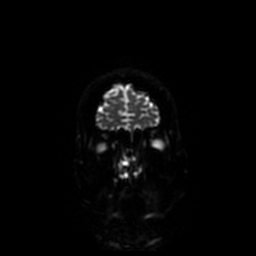
[im 24/72]
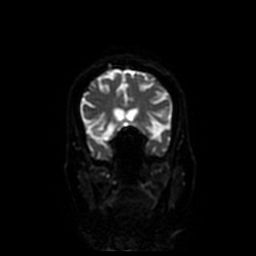
[im 36/72]
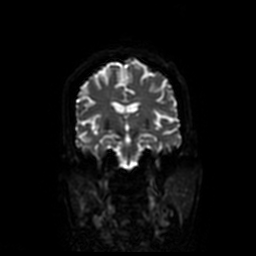
[im 48/72]
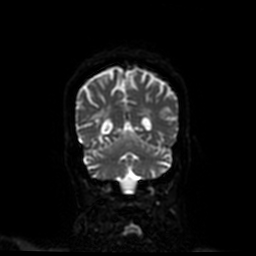
[im 60/72]
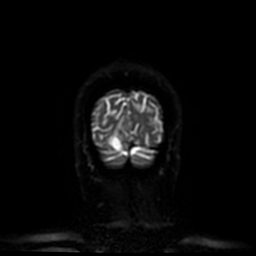
[im 72/72]
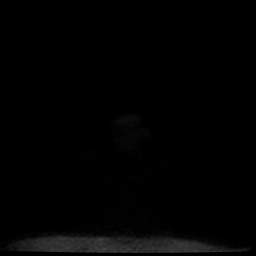

[Series 7: T2 · axial · 5.0mm · 0.43mm/px · z∈[-78,+65]mm · 3 of 25 slices shown (1 of 2)]
[im 1/25]
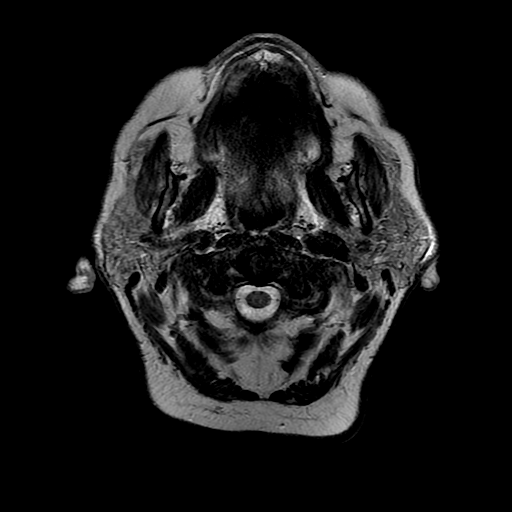
[im 13/25]
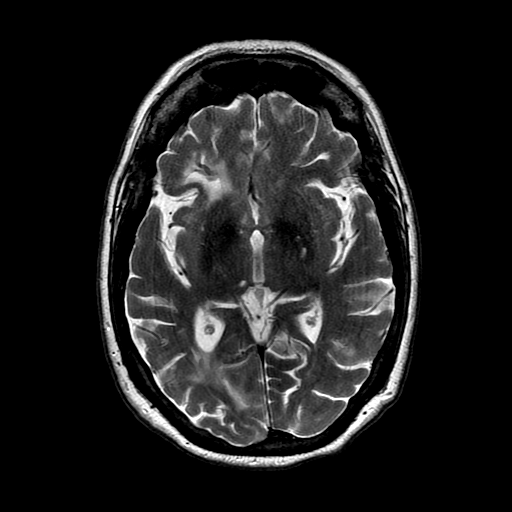
[im 25/25]
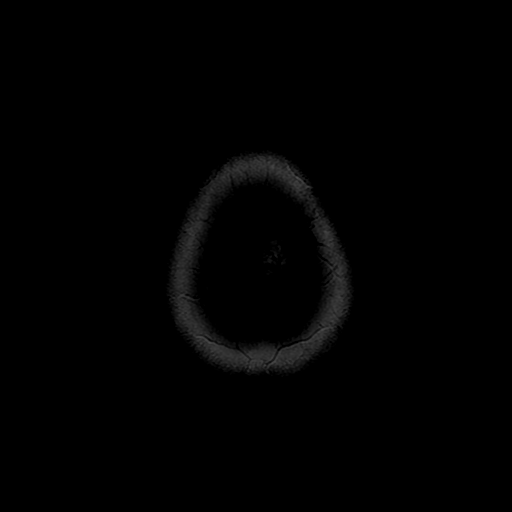

[Series 8: FLAIR · axial · 3.0mm · 0.43mm/px · z∈[-78,+65]mm · 3 of 25 slices shown]
[im 1/25]
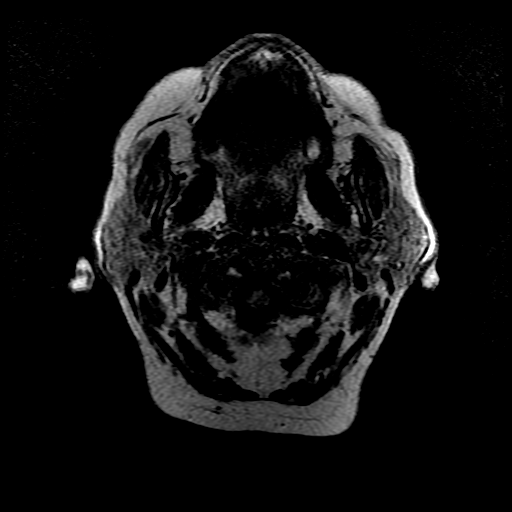
[im 13/25]
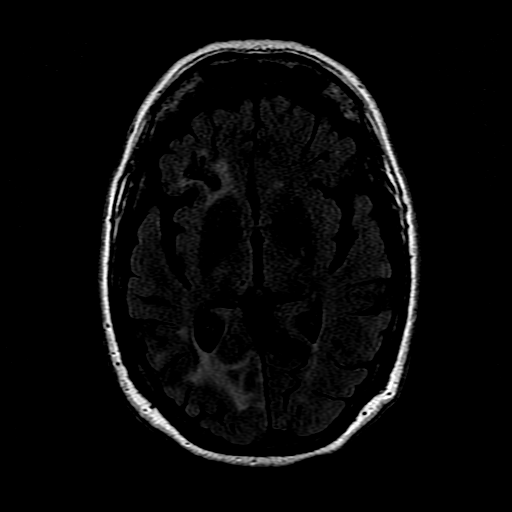
[im 25/25]
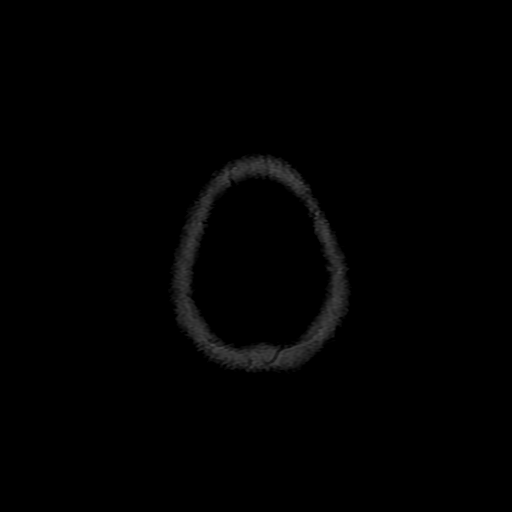

[Series 11: T2 · coronal · 5.0mm · 0.39mm/px · 2 of 24 slices shown (2 of 2)]
[im 1/24]
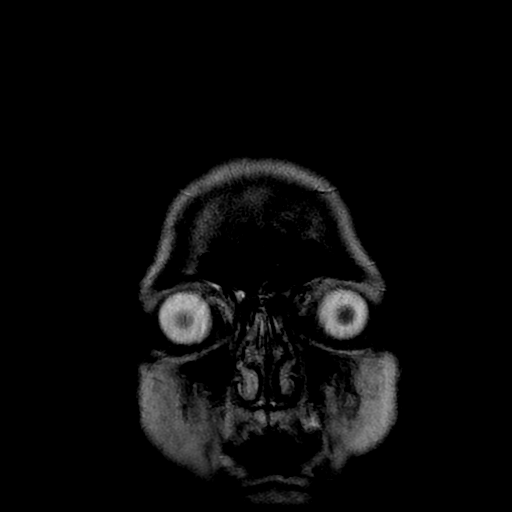
[im 24/24]
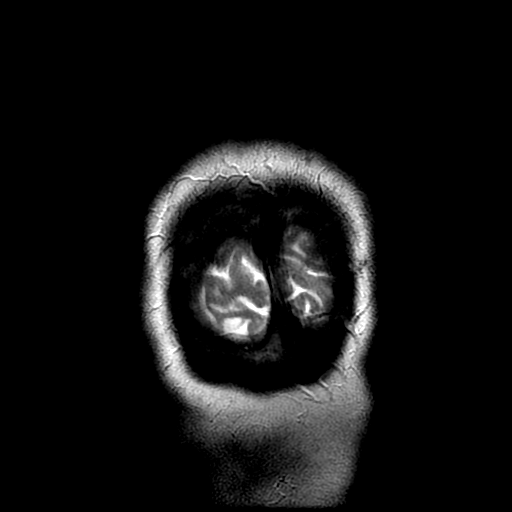

[Series 400: DWI · axial · 3.0mm · 1.09mm/px · z∈[-92,+53]mm · 5 of 50 slices shown (3 of 4)]
[im 1/50]
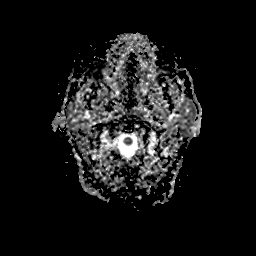
[im 13/50]
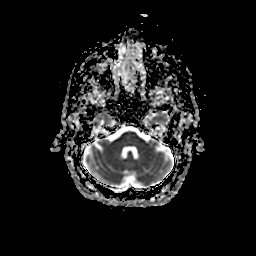
[im 25/50]
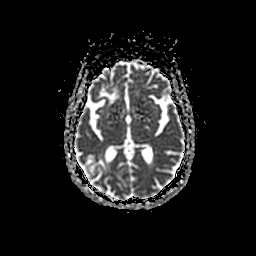
[im 37/50]
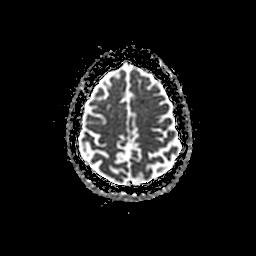
[im 50/50]
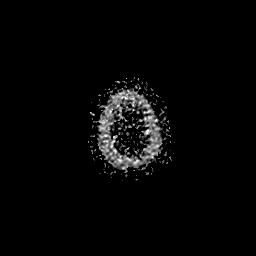

[Series 500: DWI · coronal · 5.0mm · 1.09mm/px · 4 of 36 slices shown (4 of 4)]
[im 1/36]
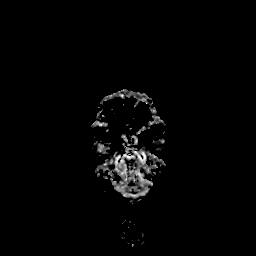
[im 12/36]
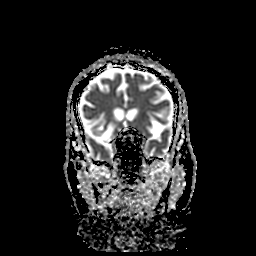
[im 24/36]
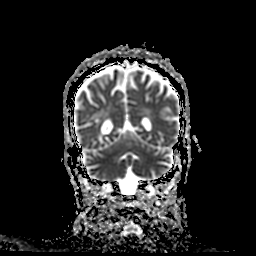
[im 36/36]
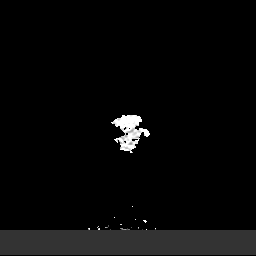

[32 of 48 positions shown; findings below may reference images not displayed]

FINDINGS: MRI HEAD FINDINGS

Brain:

Mild intermittent motion degradation.

Mild cerebral and cerebellar atrophy.

There is subtle gyriform restricted diffusion within the paramedian
right occipital lobe measuring 3 cm in greatest dimension, most
consistent with a subacute infarct (for instance as seen on series
4, image 22).

Redemonstrated chronic cortically based infarcts within the right
occipital pole, right parietal lobe and inferolateral right frontal
lobe.

There is a small amount of chronic hemosiderin deposition at site of
chronic right occipital and parietal lobe infarcts.

Small chronic lacunar infarcts within the basal ganglia and thalami
bilaterally.

Background mild multifocal T2/FLAIR hyperintensity within the
cerebral white matter which is nonspecific, but compatible with
chronic small vessel ischemic disease.

Small chronic infarcts within the inferior left cerebellar
hemisphere.

No evidence of intracranial mass.

No extra-axial fluid collection.

No midline shift.

Vascular: Expected proximal arterial flow voids.

Skull and upper cervical spine: No focal marrow lesion.

Sinuses/Orbits: Visualized orbits show no acute finding. Mild
bilateral ethmoid sinus mucosal thickening. Small right maxillary
sinus mucous retention cyst.

Other: Left mastoid effusion. Trace fluid is also present within the
right mastoid air cells.

MRA HEAD FINDINGS

The intracranial internal carotid arteries are patent.
Atherosclerotic irregularity of both vessels. Most notably, there is
moderate focal stenosis of the distal cavernous/paraclinoid left
ICA. The M1 middle cerebral arteries are patent. No M2 proximal
branch occlusion is identified. Atherosclerotic irregularity of the
M2 and more distal middle cerebral arteries bilaterally. Most
notably, there is a high-grade focal stenosis within a proximal left
M2 MCA branch (series 306, image 7). The anterior cerebral arteries
are patent. Moderate focal stenosis within the distal aspect of the
left A1 segment. Mild-to-moderate stenoses within the bilateral A2
segments.

1-2 mm inferiorly projecting vascular protrusion arising from the
paraclinoid right ICA which may reflect an aneurysm or infundibulum
(series 3, images 80 and 79).

1-2 mm inferiorly projecting vascular protrusion arising from the
mid M1 left middle cerebral artery which may reflect an aneurysm or
infundibulum (series 3, image 87) (series 306, image 13).

The non dominant intracranial right vertebral artery terminates
predominantly as the right PICA (with a possible small contribution
to the basilar artery. The dominant intracranial left vertebral
artery is patent without stenosis. The basilar artery is patent.
High-grade stenosis within the proximal left PICA (series 307, image
6). Sites of apparent high-grade stenosis within the proximal
superior cerebellar arteries bilaterally. The right posterior
cerebral artery is patent proximally with diffuse atherosclerotic
irregularity. Most notably, there is a high-grade focal stenosis
within the P1 right PCA. The distal right posterior cerebral artery
is poorly delineated and may be occluded. Markedly irregular and
stenotic appearance of the P1 left posterior cerebral artery. Distal
to this, the left PCA is poorly delineated, likely secondary to
high-grade stenosis or occlusion. Posterior communicating arteries
are hypoplastic or absent bilaterally.
IMPRESSION: MRI brain:

1. Mildly motion degraded exam.
2. 3 cm subacute appearing cortically based infarct within the
medial right occipital lobe, unchanged in extent from the head CT of
[DATE].
[DATE]. Chronic cortically based infarcts within the right parietal,
right occipital and inferolateral right frontal lobes.
4. Chronic lacunar infarcts within the bilateral basal ganglia and
thalami.
5. Background mild cerebral white matter chronic small vessel
ischemic disease.
6. Small chronic infarcts within the inferior left cerebellar
hemisphere.
7. Mild paranasal sinus disease, as described.
8. Left mastoid effusion.

MRA head:

1. Advanced intracranial atherosclerotic disease, as outlined and
most notably as follows.
2. High-grade stenosis within the proximal left PICA.
3. Sites of apparent severe stenosis within the proximal superior
cerebellar arteries bilaterally.
4. The right posterior cerebral artery is patent proximally.
However, there is a high-grade focal stenosis within the right P1
segment. The distal right posterior cerebral artery is poorly
delineated and may be occluded.
5. Markedly irregular and stenotic appearance of the P1 left
posterior cerebral artery. Distal to this, the left posterior
cerebral artery is poorly delineated, likely secondary to high-grade
stenosis or occlusion.
6. Moderate focal stenosis of the distal cavernous/paraclinoid left
ICA.
7. High-grade focal stenosis within a proximal left M2 MCA vessel.
8. Moderate focal stenosis within the distal A1 segment of the left
anterior cerebral artery.
9. 1-2 mm inferiorly projecting vascular protrusion arising from the
paraclinoid right ICA, which may reflect an aneurysm or
infundibulum.
10. 1-2 mm inferiorly projecting vascular protrusion arising from
the mid M1 left middle cerebral artery, which may reflect an
aneurysm or infundibulum.

## 2021-04-06 IMAGING — MR MR MRA HEAD W/O CM
1 series · 16 of 48 positions shown · non-contrast
Comparison: Prior head CT examinations [DATE] and earlier.

CLINICAL DATA: Dizziness, nonspecific.  Stroke, follow-up.

EXAM:
MRI HEAD WITHOUT CONTRAST
MRA HEAD WITHOUT CONTRAST
TECHNIQUE: Multiplanar, multiecho pulse sequences of the brain and surrounding
structures were obtained without intravenous contrast. Angiographic
images of the head were obtained using MRA technique without
contrast.

[Series 3: (id) mt fs · axial · 1.4mm · 0.43mm/px · z∈[-84,+5]mm · 16 of 136 slices shown]
[im 1/136]
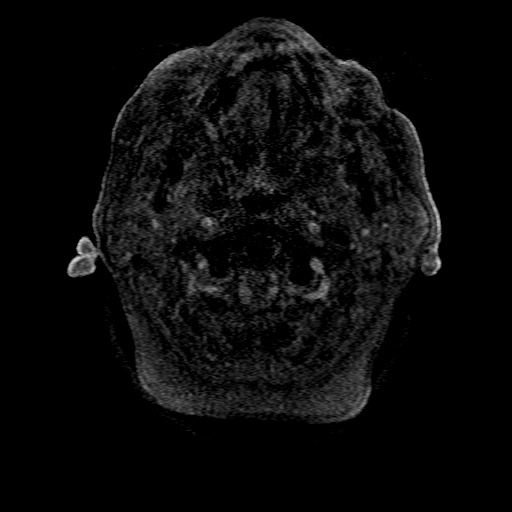
[im 3/136]
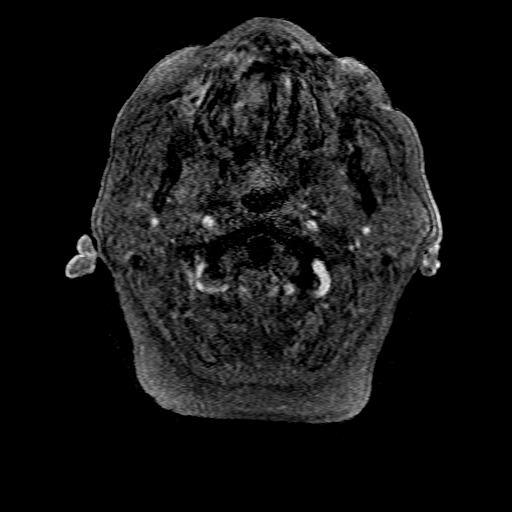
[im 6/136]
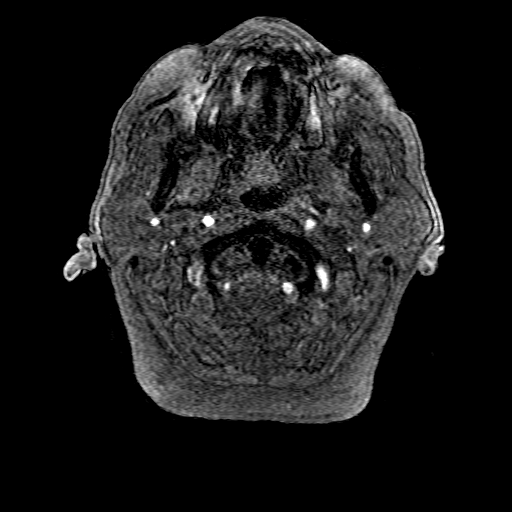
[im 9/136]
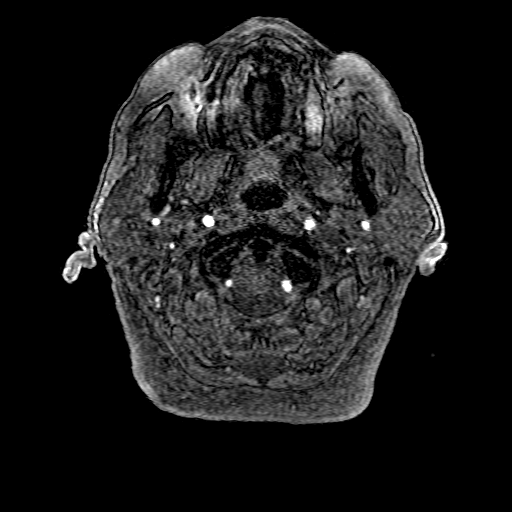
[im 12/136]
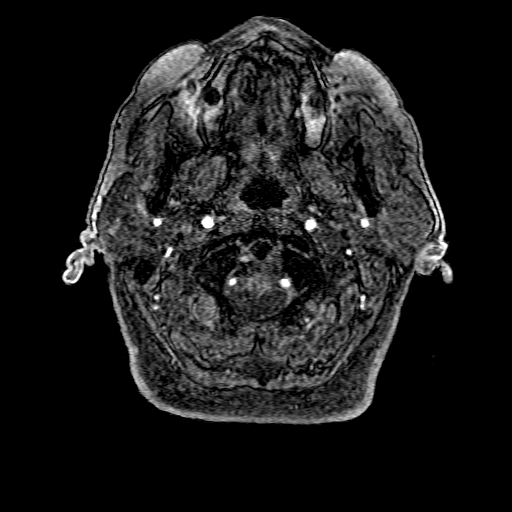
[im 15/136]
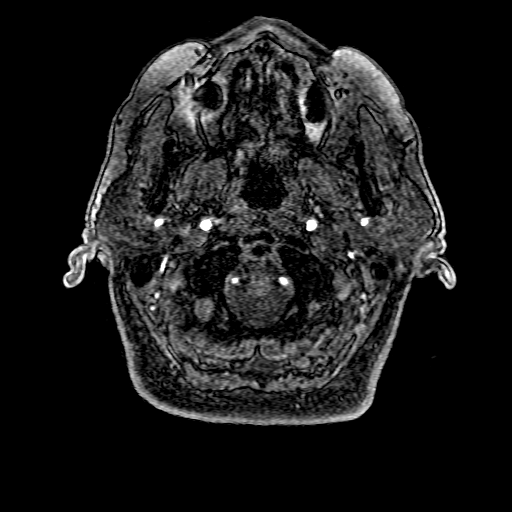
[im 23/136]
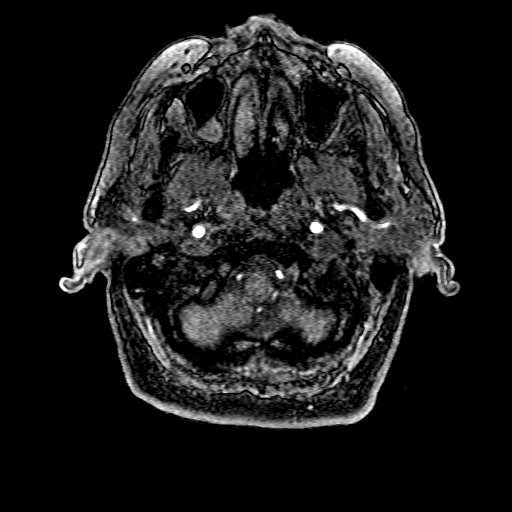
[im 26/136]
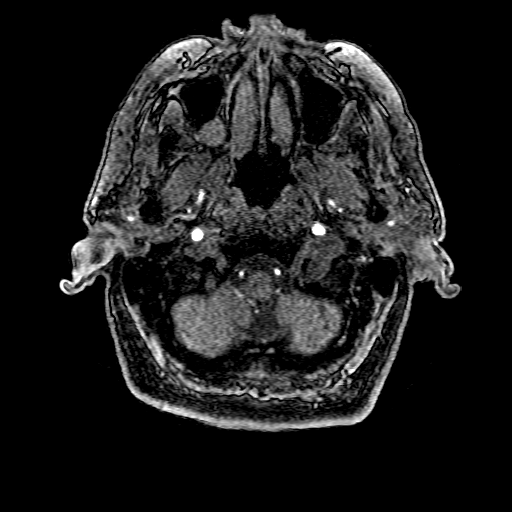
[im 44/136]
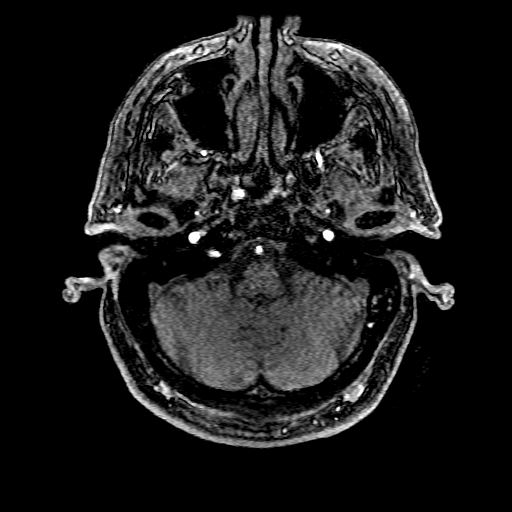
[im 61/136]
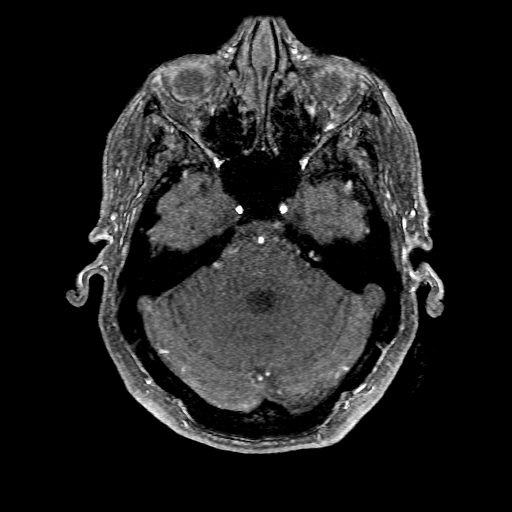
[im 69/136]
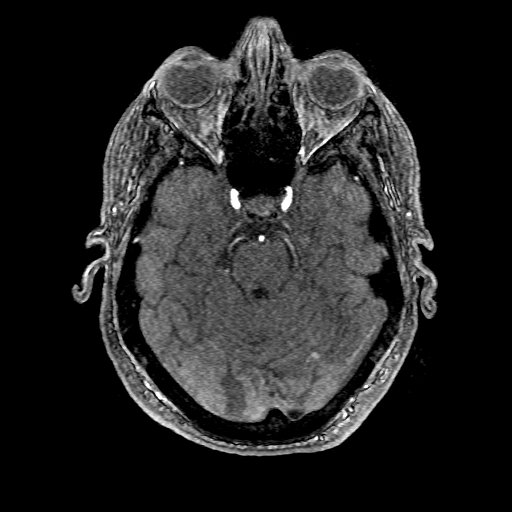
[im 78/136]
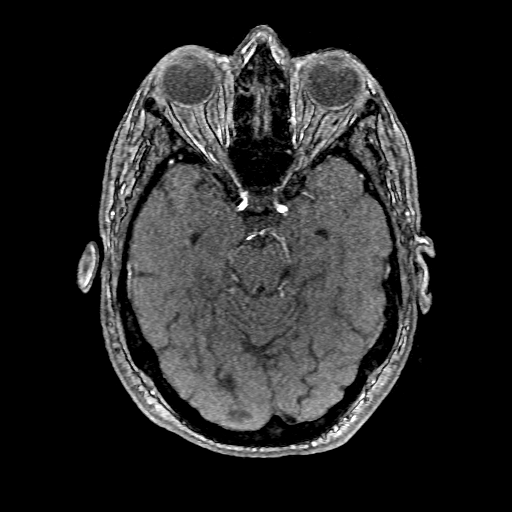
[im 95/136]
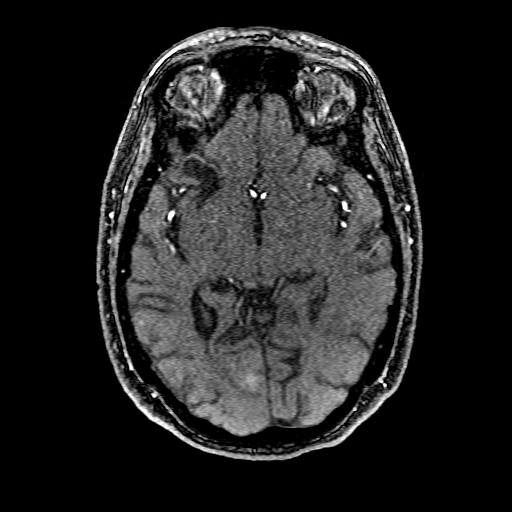
[im 113/136]
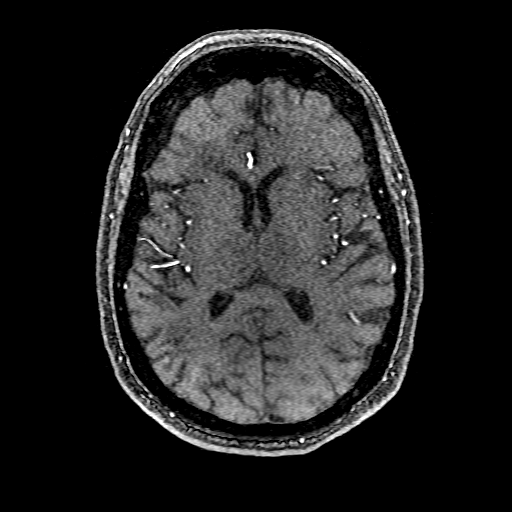
[im 115/136]
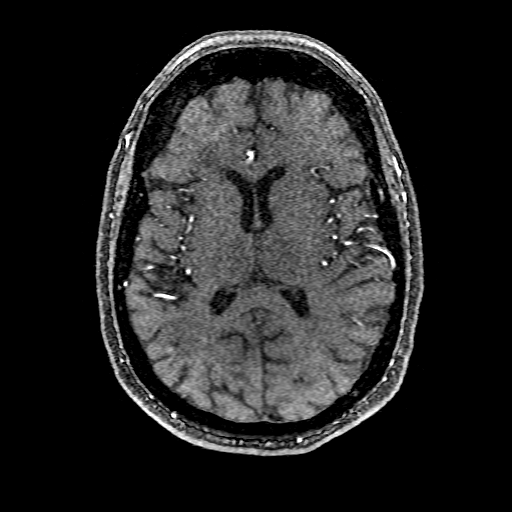
[im 130/136]
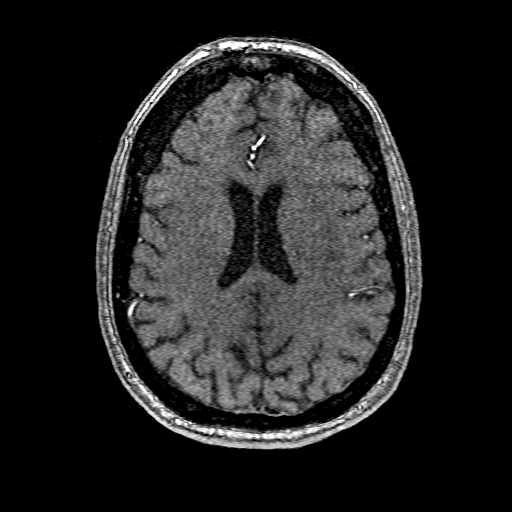

[16 of 48 positions shown; findings below may reference images not displayed]

FINDINGS: MRI HEAD FINDINGS

Brain:

Mild intermittent motion degradation.

Mild cerebral and cerebellar atrophy.

There is subtle gyriform restricted diffusion within the paramedian
right occipital lobe measuring 3 cm in greatest dimension, most
consistent with a subacute infarct (for instance as seen on series
4, image 22).

Redemonstrated chronic cortically based infarcts within the right
occipital pole, right parietal lobe and inferolateral right frontal
lobe.

There is a small amount of chronic hemosiderin deposition at site of
chronic right occipital and parietal lobe infarcts.

Small chronic lacunar infarcts within the basal ganglia and thalami
bilaterally.

Background mild multifocal T2/FLAIR hyperintensity within the
cerebral white matter which is nonspecific, but compatible with
chronic small vessel ischemic disease.

Small chronic infarcts within the inferior left cerebellar
hemisphere.

No evidence of intracranial mass.

No extra-axial fluid collection.

No midline shift.

Vascular: Expected proximal arterial flow voids.

Skull and upper cervical spine: No focal marrow lesion.

Sinuses/Orbits: Visualized orbits show no acute finding. Mild
bilateral ethmoid sinus mucosal thickening. Small right maxillary
sinus mucous retention cyst.

Other: Left mastoid effusion. Trace fluid is also present within the
right mastoid air cells.

MRA HEAD FINDINGS

The intracranial internal carotid arteries are patent.
Atherosclerotic irregularity of both vessels. Most notably, there is
moderate focal stenosis of the distal cavernous/paraclinoid left
ICA. The M1 middle cerebral arteries are patent. No M2 proximal
branch occlusion is identified. Atherosclerotic irregularity of the
M2 and more distal middle cerebral arteries bilaterally. Most
notably, there is a high-grade focal stenosis within a proximal left
M2 MCA branch (series 306, image 7). The anterior cerebral arteries
are patent. Moderate focal stenosis within the distal aspect of the
left A1 segment. Mild-to-moderate stenoses within the bilateral A2
segments.

1-2 mm inferiorly projecting vascular protrusion arising from the
paraclinoid right ICA which may reflect an aneurysm or infundibulum
(series 3, images 80 and 79).

1-2 mm inferiorly projecting vascular protrusion arising from the
mid M1 left middle cerebral artery which may reflect an aneurysm or
infundibulum (series 3, image 87) (series 306, image 13).

The non dominant intracranial right vertebral artery terminates
predominantly as the right PICA (with a possible small contribution
to the basilar artery. The dominant intracranial left vertebral
artery is patent without stenosis. The basilar artery is patent.
High-grade stenosis within the proximal left PICA (series 307, image
6). Sites of apparent high-grade stenosis within the proximal
superior cerebellar arteries bilaterally. The right posterior
cerebral artery is patent proximally with diffuse atherosclerotic
irregularity. Most notably, there is a high-grade focal stenosis
within the P1 right PCA. The distal right posterior cerebral artery
is poorly delineated and may be occluded. Markedly irregular and
stenotic appearance of the P1 left posterior cerebral artery. Distal
to this, the left PCA is poorly delineated, likely secondary to
high-grade stenosis or occlusion. Posterior communicating arteries
are hypoplastic or absent bilaterally.
IMPRESSION: MRI brain:

1. Mildly motion degraded exam.
2. 3 cm subacute appearing cortically based infarct within the
medial right occipital lobe, unchanged in extent from the head CT of
[DATE].
[DATE]. Chronic cortically based infarcts within the right parietal,
right occipital and inferolateral right frontal lobes.
4. Chronic lacunar infarcts within the bilateral basal ganglia and
thalami.
5. Background mild cerebral white matter chronic small vessel
ischemic disease.
6. Small chronic infarcts within the inferior left cerebellar
hemisphere.
7. Mild paranasal sinus disease, as described.
8. Left mastoid effusion.

MRA head:

1. Advanced intracranial atherosclerotic disease, as outlined and
most notably as follows.
2. High-grade stenosis within the proximal left PICA.
3. Sites of apparent severe stenosis within the proximal superior
cerebellar arteries bilaterally.
4. The right posterior cerebral artery is patent proximally.
However, there is a high-grade focal stenosis within the right P1
segment. The distal right posterior cerebral artery is poorly
delineated and may be occluded.
5. Markedly irregular and stenotic appearance of the P1 left
posterior cerebral artery. Distal to this, the left posterior
cerebral artery is poorly delineated, likely secondary to high-grade
stenosis or occlusion.
6. Moderate focal stenosis of the distal cavernous/paraclinoid left
ICA.
7. High-grade focal stenosis within a proximal left M2 MCA vessel.
8. Moderate focal stenosis within the distal A1 segment of the left
anterior cerebral artery.
9. 1-2 mm inferiorly projecting vascular protrusion arising from the
paraclinoid right ICA, which may reflect an aneurysm or
infundibulum.
10. 1-2 mm inferiorly projecting vascular protrusion arising from
the mid M1 left middle cerebral artery, which may reflect an
aneurysm or infundibulum.

## 2021-04-06 IMAGING — DX DG CHEST 1V PORT
1 series · 1 of 1 positions shown · non-contrast
Comparison: [DATE]

CLINICAL DATA: Nonspecific chest pain, history of DVT

EXAM:
PORTABLE CHEST 1 VIEW

[chest ap]
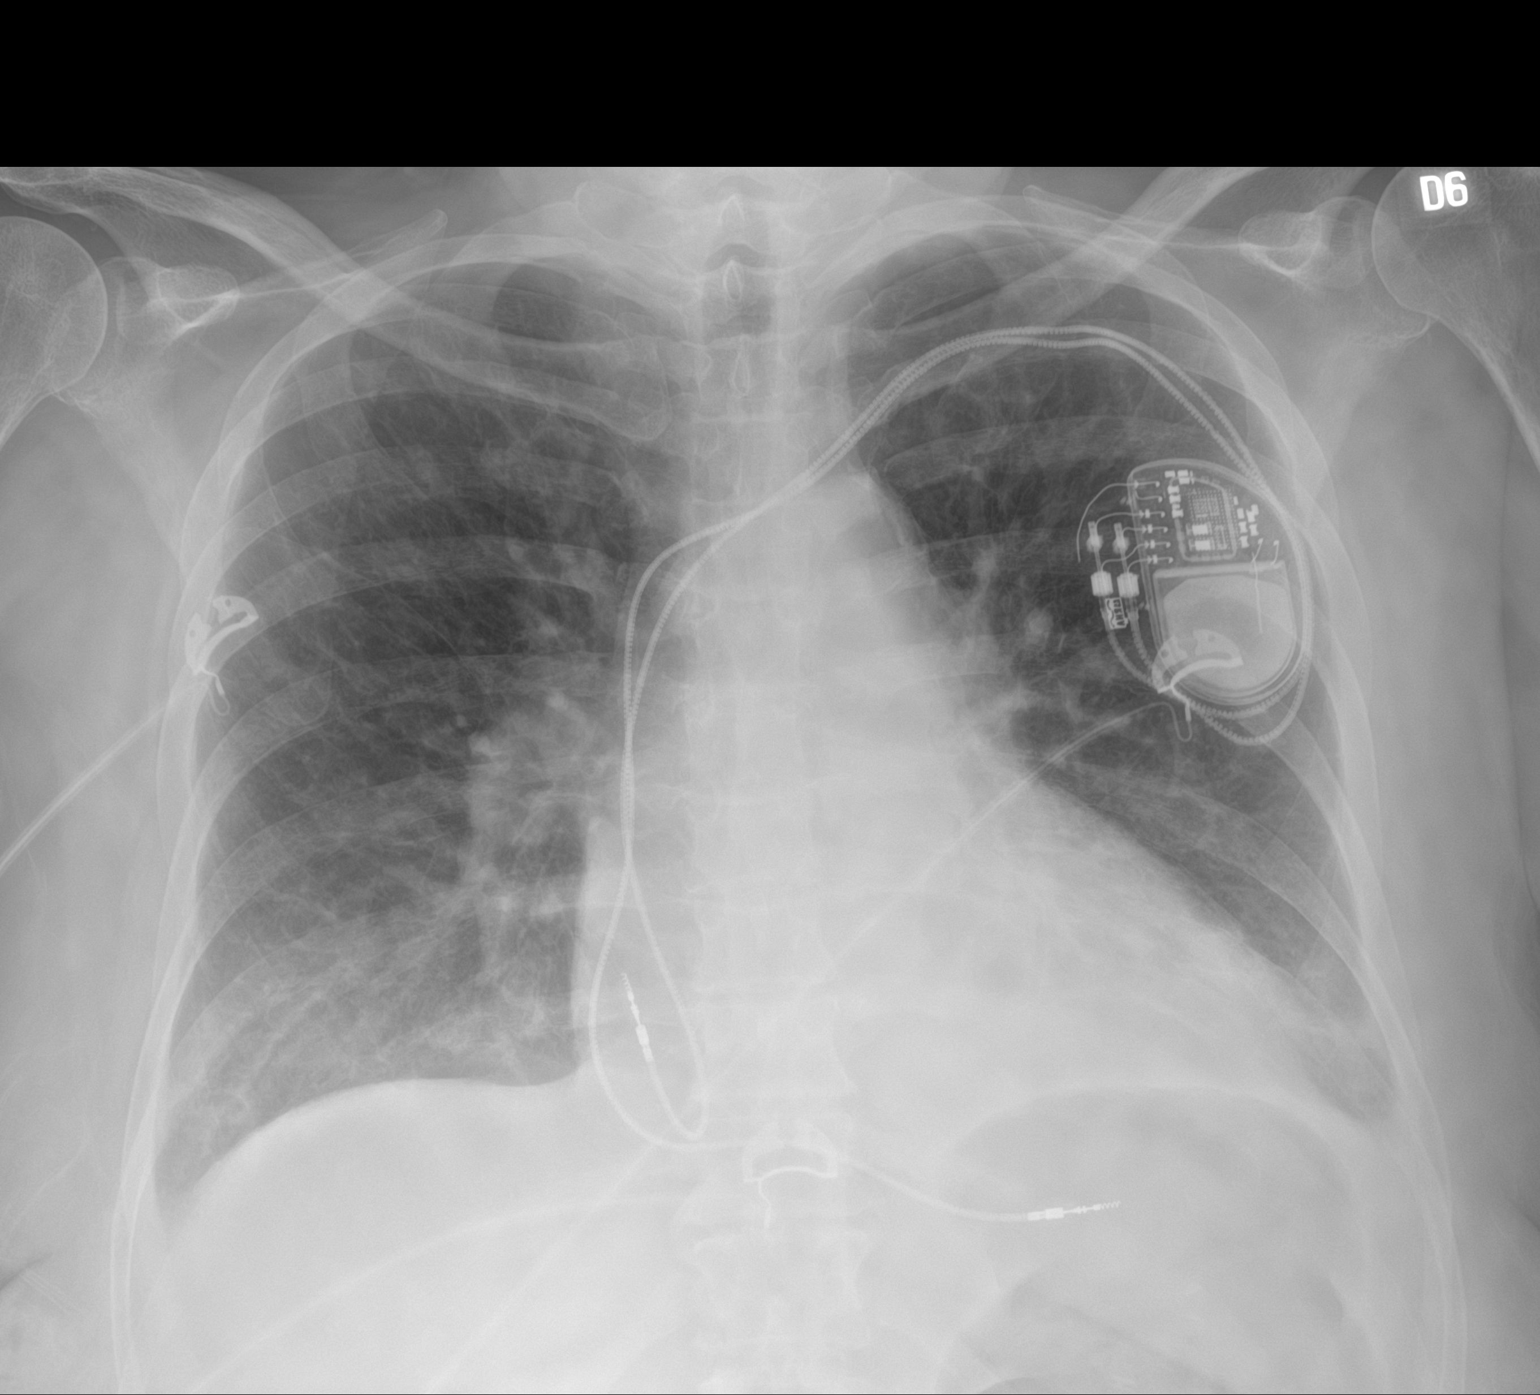

[1 of 1 positions shown; findings below may reference images not displayed]

FINDINGS: Single frontal view of the chest demonstrates stable dual lead pacer
overlying left chest. Cardiac silhouette is unremarkable. Trace
bilateral pleural effusions are noted. Patchy left lower lobe
consolidation could reflect airspace disease or atelectasis.
Continued central vascular congestion. No pneumothorax. No acute
bony abnormalities.
IMPRESSION: 1. Findings favoring mild volume overload, with central vascular
congestion and trace bilateral effusions.
2. Retrocardiac opacity consistent with atelectasis or airspace
disease.

## 2021-04-06 MED ORDER — INSULIN GLARGINE 100 UNIT/ML ~~LOC~~ SOLN
15.0000 [IU] | Freq: Every day | SUBCUTANEOUS | Status: DC
  Administered 2021-04-07 – 2021-04-08 (×2): 15 [IU] via SUBCUTANEOUS
  Filled 2021-04-06 (×2): qty 0.15

## 2021-04-06 MED ORDER — TECHNETIUM TO 99M ALBUMIN AGGREGATED
4.4000 | Freq: Once | INTRAVENOUS | Status: AC | PRN
  Administered 2021-04-06: 4.4 via INTRAVENOUS

## 2021-04-06 NOTE — Procedures (Signed)
  PROCEDURE: EEG routine 25 minutes with video study  DATES OF TEST: 04/03/2021  REASON FOR TEST: Syncope  AED/Sedative MEDICATIONS: n/a  TECHNIQUE: This is an 18 channel digital EEG recording using the standard international 10/20 system of electrode placement with one channel EKG recording. Pt was awake and drowsy during this recording.  FINDINGS:  Background rhythm:symmetric diffuse 5-7 Hz Amplitude : normal Continuity: continuous Breach effect:  NO Variability: YES Reactivity: YES Rhythmic delta activity: NO Periodic discharges: NO Sporadic epileptiform discharges: NO Electrographic/electroclinical seizures: NO  Limited EKG reveals no abnormalities.    IMPRESSION:  This is an abnormal study due to - 1. Mild Generalized slowing is a non-specific finding consistent with a generalized disturbance of cerebral functioning including toxic, metabolic, or structural abnormalities that are multi-focal or diffuse. 2. No definite epileptiform discharges or electrographic seizures noted.

## 2021-04-06 NOTE — Progress Notes (Signed)
PT Cancellation Note  Patient Details Name: Tracey Bryant MRN: [id number] DOB: 06-09-1951   Cancelled Treatment:    Reason Eval/Treat Not Completed: Patient at procedure or test/unavailable   Shary Decamp [facility] 04/06/2021, 3:23 PM [place] Pager [phone] Office [phone]

## 2021-04-06 NOTE — Progress Notes (Addendum)
Tracey Bryant he also STROKE TEAM PROGRESS NOTE   INTERVAL HISTORY Patient is resting comfortably in her bed. Patient reports that she is anxious to know what caused her presenting symptoms, but she is aware that this will take some time. Patient EEG has been completed, but not read.  Patient reports that today she continues to feel unwell despite lying in bed. Patient reports that she is hesitant to move her head as turning her head causes her to feel that the room is spinning. Patient denies tinnitus. Patient is overall very pleasant but makes it a point to not move her neck or head very much. Movement of her extremities does not cause pain or significant discomfort.   Vitals:   04/06/21 0619 04/06/21 0721 04/06/21 1225 04/06/21 1433  BP:  (!) 141/70 (!) 141/67 (!) 151/66  Pulse:  65 64 61  Resp:  18 (!) 21 12  Temp:  97.9 F (36.6 C) 97.9 F (36.6 C) 98.3 F (36.8 C)  TempSrc:  Oral Oral Oral  SpO2:  100% 97% 98%  Weight: 78.8 kg     Height:       CBC:  Recent Labs  Lab 04/05/21 0034 04/06/21 0829  WBC 8.7 8.5  NEUTROABS  --  4.8  HGB 9.2* 9.9*  HCT 30.4* 32.5*  MCV 98.7 97.6  PLT 449* 644*   Basic Metabolic Panel:  Recent Labs  Lab 04/05/21 0034 04/06/21 0829  NA 138 139  K 4.0 3.6  CL 98 100  CO2 29 30  GLUCOSE 139* 83  BUN 33* 30*  CREATININE 2.71* 2.45*  CALCIUM 9.1 8.9  MG  --  2.2   Lipid Panel:  Recent Labs  Lab 04/05/21 0034  CHOL 92  TRIG 82  HDL 37*  CHOLHDL 2.5  VLDL 16  LDLCALC 39    IMAGING past 24 hours No results found.  PHYSICAL EXAM Constitutional: Pleasant elderly mildly obese Caucasian lady not in distress Cardiovascular: Normal RR Respiratory: No increased WOB   Mental status: Awake alert oriented to time, place, sitation and person. Speech: Normal without aphasia or dysarthria Cranial nerves: Pupils 76mm PERRL. Extraocular movements are full range without nystagmus.  Blinks to threat bilaterally.  Dense left homonymous hemianopsia.   Sensation in face intact bilaterally. Smile is symmetric. Soft palate elevation is symmetric. Shoulder shrug is symmetric. Patient tongue is midline.  Motor: Normal bulk and tone. No drift. BUE 3/5 BLE 3-/5.  Coordination: FTN intact bilaterally.  Sensation normal bilaterally, extinction intact Gait: Deferred,   ASSESSMENT/PLAN Tracey Bryant is a 70 y.o. female w/pmh of type 2 diabetes mellitus, hypertension, atrial fibrillation on Eliquis, chronic systolic heart failure with permanent pacemaker, and multiple strokes- last in May 2021 with initial right hemiparesis that has since improved who presented to the hospital on 3/21 for acute calculus cholecystitis s/p percutaneous drain placementcomplicated by sepsis s/p 10 days of antibiotic treatment.Hospitalization complicated by an acute kidney injury, anemia, volume overload, LLE DVT (age indeterminate), and multiple syncopal spells. Patient's frequent falls are concerning for cardiac etiology which can increase patient risk for stroke. Patient has a known hx of A fib and has been taking Eliquis. If confirmed patient has had another stroke this would indicate failure of the medication. Patient EEG is also pending for evaluation of possible seizure episodes as etiology behind falls.  Patient MRI is pending, but patient CT was negative for acute infarct. Patient physical exam today was also concerning for vertigo as patient reported significant  feelings of "spinning" by just slightly turning her head to the left or right and patient remained very still in her bed. Patient has no known hx of vertigo.    #Stroke subacute right PCA and left cerebellum likely of cardioembolic etiology given cardiomyopathy.  Also suspicion for possible arrhythmias given history of multiple syncopal episodes.  CT Head: No intracranial hemorrhage or mass effect but Multiple subacute to chronic infarcts within the right cerebral and left cerebellar hemispheres.  MRI   pending  MRA  pending  Carotid Doppler  no significant extracranial stenosis  2D Echo 03/18/2021: LVEF 25-30% w/ global hypokinesis.   EEG pending  LDL 39  HgbA1c 9.1  VTE prophylaxis - SCDs    Diet   Diet heart healthy/carb modified Room service appropriate? Yes; Fluid consistency: Thin; Fluid restriction: 1500 mL Fluid     Eliquis (apixaban) daily prior to admission, now on Eliquis (apixaban) daily.   Therapy recommendations:  PT/OT eval. Attempt Tx for vertigo  Disposition:  Pending   Hyperlipidemia  Home meds:  zetia 10mg , resumed in hospital  LDL 39, goal < 70  Continue at discharge  Diabetes type II   Home meds:  Glipizide 5mg , Lantus 50 U QHS, and NOVOLIN 70/30 10 U TID  HgbA1c 9.1, goal < 7.0  CBGs Recent Labs    04/06/21 0724 04/06/21 1223 04/06/21 1441  GLUCAP 79 171* 122*      SSI  Other Stroke Risk Factors  Advanced Age >/= 11   Hx stroke  Congestive heart failure   Hospital day # 39  [name], MD PGY-1 I have personally obtained history,examined this patient, reviewed notes, independently viewed imaging studies, participated in medical decision making and plan of care.ROS completed by me personally and pertinent positives fully documented  I have made any additions or clarifications directly to the above note. Agree with note above.  Check MRI/MRA.  Continue Eliquis for stroke prevention  Antony Contras, MD Medical Director [place] Pager: [phone] 04/06/2021 6:26 PM  To contact Stroke Continuity provider, please refer to [email or web address] After hours, contact General Neurology

## 2021-04-06 NOTE — TOC Progression Note (Signed)
Transition of Care [facility]) - Progression Note    Patient Details  Name: Tracey Bryant MRN: [id number] Date of Birth: 1951-12-07  Transition of Care [facility]) CM/SW [place], [place] Phone Number: 04/06/2021, 11:05 AM  Clinical Narrative:    [facility] has received insurance approval, effective through Wednesday, 04/08/21. CSW will continue to follow for medical readiness.    Expected Discharge Plan: [place] Barriers to Discharge: Insurance Authorization,Continued Medical Work up  Expected Discharge Plan and Services Expected Discharge Plan: [place] In-house Referral: Clinical Social Work   Post Acute Care Choice: [place] Living arrangements for the past 2 months: Single Family Home                                       Social Determinants of Health (SDOH) Interventions    Readmission Risk Interventions Readmission Risk Prevention Plan 03/19/2021 03/17/2021  Transportation Screening - Complete  PCP or Specialist Appt within 5-7 Days Complete -  Home Care Screening Complete -  Medication Review (RN CM) - Complete  Some recent data might be hidden

## 2021-04-06 NOTE — Progress Notes (Signed)
Per order, Changed device settings for MRI to  ?DOO at 80 bpm  ?Will program device back to pre-MRI settings after completion of exam, and send transmission ?

## 2021-04-06 NOTE — Plan of Care (Signed)

## 2021-04-06 NOTE — Progress Notes (Addendum)
PROGRESS NOTE        PATIENT DETAILS Name: Tracey Bryant Age: 70 y.o. Sex: female Date of Birth: 12-Nov-1951 Admit Date: 03/16/2021 Admitting Physician Deatra James, MD XBD:[id number], Arlyn Leak, FNP  Brief Narrative: Patient is a 70 y.o. female with past medical history of DM-2, HTN, A. fib, chronic systolic heart failure, s/p permanent pacemaker placement-presented to the ED on 3/21  with nausea/vomiting/abdominal pain-further evaluation revealed acute calculus cholecystitis.  Hospital course complicated by AKI, multifactorial anemia, volume overload, age indeterminate left DVT, multiple syncopal episodes.  See below for further details  Significant events: 3/20>> ED visit for epistaxis 3/21>> admit to [facility] for acute calculus cholecystitis 3/24>> transfer to [facility] for percutaneous gallbladder drain placement 4/2>> left leg DVT-severe anemia-after discussion risk/benefits-started on IV heparin 4/3>>syncope while working with PT 4/7-4/8>> very short duration syncopal events (few seconds) when working with PT/OT.  Telemetry negative.  PPM interrogated-no arrhythmia/pauses.  Orthostatic vital signs during these episodes done by PT were negative  Significant studies: 3/21>> A1c 9.1 3/22 >> CT abdomen/pelvis: Cholelithiasis, bilateral small pleural effusions, ascites with anasarca 3/23>> RUQ ultrasound: Acute calculus cholecystitis 3/23>> Echo: EF 25-30%, decreased RV systolic function 2/68>> mild central pulmonary vascular congestion. 4/2>> lower extremity Doppler: Age indeterminate DVT left popliteal vein. 4/8>> CT head: Multiple subacute to chronic infarcts within the right cerebral/left cerebellar hemispheres.  Antimicrobial therapy: Zosyn: 3/22>>3/31  Microbiology data: 3/22>> urine culture: E. Coli 3/22>> blood culture: No growth 3/24>> gallbladder/bile culture: No growth  Procedures : 3/24>> CT-guided cholecystostomy tube placement  Consults:  IR,  general surgery, nephrology, gastroenterology, neurology, Cards called (declined by Cards)  DVT Prophylaxis :  Eliquis  Subjective:  Patient in bed, appears comfortable, denies any headache, no fever, no chest pain or pressure, no shortness of breath , no abdominal pain. No new focal weakness.   Assessment/Plan:  Severe sepsis due to acute calculus cholecystitis and UTI: Sepsis physiology has resolved-culture data as above- completed 10 days of Zosyn on 03/26/21, IR following for percutaneous cholecystostomy tube had been seen by general surgery at [facility] earlier.  Recurrent Syncope even lying flat in bed: Occurred on 4/3 while working with physical therapy-and again on 4/7 x 2-(initial episode when standing up with PT-second episode while in bed).  Subsequently had numerous episodes on 4/8-even with raising her hands.  These episodes last just a few seconds and appear only to be associated with change in posture and raising hands (but multiple orthostatic vital signs were negative).  Telemetry has been negative-after discussion with cardiology navigator-pacemaker was interrogated on 4/7-no pauses or arrhythmias were seen.  Per neurology Syncope likely not neurological in origin, pending MRI/MRA and EEG results, will also get VQ and Cards was fomally consulted on 04/06/21, Dr Quentin Ore declined the need of formal consult after reviewing the pacemaker, nothing further to add.   ?  Acute CVA: CT head as above-patient was off anticoagulation during the earlier part of hospital stay due to severe anemia-and epistaxis on presentation [facility]).  She has no focal neurological deficits.  Her pacemaker appears to be MRI compatible-MRI/MRA will be done later on 04/06/2021, on Eliquis, A1c needs improvement, LDL is stable, Neuro following, DW Dr Leonie Man, pending EEG.  Acute on chronic systolic/diastolic heart failure EF 25%: Volume status has improved significantly-she is > 23 L negative balance.   She is currently  not orthostatic she continues to have recurrent syncopal episodes E. coli in fact of unclear etiology, will request cardiology to evaluate as well.  AKI on CKD stage IIIa: AKI likely hemodynamically mediated-gradually improving-creatinine downtrending but has somewhat plateaued range.  Volume status is markedly improved over the past few days.  Acute urinary retention: Foley catheter placed on 3/26-started Flomax on 3/30-successful voiding trial on 4/2-no longer with Foley catheter.  No longer on Flomax.  Transaminitis: Multifactorial-probably due to sepsis/acute cholecystitis-resolved.  Hyponatremia: Due to hypervolemia-resolved with diuretics.  Anemia: Multifactorial-some component of acute blood loss due to epistaxis that occurred 1 day prior to this hospitalization-on top of anemia due to chronic disease/critical illness.  Also FOBT positive-no overt GI blood loss.  Is a [name] continues to refuse blood transfusion-she confirms that she will refuse blood-even in the face of life-threatening situation.  Hemoglobin levels are slowly improving with IV iron (completed) and weekly Aranesp.  Continue to monitor CBC periodically.   Left lower leg DVT: As per discussions between previous MD and son patient is now on Eliquis.   PAF: Appears to be in sinus rhythm-Eliquis initially on hold due to severity of anemia and recent history of epistaxis-however now due to diagnosis of DVT-on therapeutic anticoagulation.  History of CVA: Nonfocal exam-difficult situation-Eliquis resumed.  HLD: On Zetia  DM-2:  Lantus and nighttime insulin adjusted on 04/06/2021.  Recent Labs    04/05/21 1627 04/05/21 2151 04/06/21 0724  GLUCAP 86 203* 79   Lab Results  Component Value Date   HGBA1C 9.1 (H) 03/16/2021     Obesity: Estimated body mass index is 28.04 kg/m as calculated from the following:   Height as of this encounter: 5\' 6"  (1.676 m).   Weight as of this encounter:  78.8 kg.   Debility/deconditioning: Due to acute illness-PT/OT recommending CIR-however probably will require SNF per notes from CIR.  Diet: Diet Order            Diet heart healthy/carb modified Room service appropriate? Yes; Fluid consistency: Thin; Fluid restriction: 1500 mL Fluid  Diet effective now                  Code Status: Full code  Family Communication: Son-[phone]-spoke with son on 4/8-called on 4/9-unable to leave voicemail-as mailbox not set up yet  Disposition Plan: Status is: Inpatient  Remains inpatient appropriate because:Inpatient level of care appropriate due to severity of illness   Dispo: The patient is from: Home              Anticipated d/c is to: SNF              Patient currently is not medically stable to d/c.   Difficult to place patient No    Barriers to Discharge: Possible CVA on CT head-awaiting MRI BRAIN-neuro work-up.  Antimicrobial agents: Anti-infectives (From admission, onward)   Start     Dose/Rate Route Frequency Ordered Stop   03/26/21 2200  piperacillin-tazobactam (ZOSYN) IVPB 3.375 g        3.375 g 12.5 mL/hr over 240 Minutes Intravenous Every 12 hours 03/26/21 1137 03/27/21 0158   03/18/21 2200  piperacillin-tazobactam (ZOSYN) IVPB 3.375 g  Status:  Discontinued        3.375 g 12.5 mL/hr over 240 Minutes Intravenous Every 12 hours 03/18/21 1201 03/26/21 1137   03/17/21 1400  piperacillin-tazobactam (ZOSYN) IVPB 3.375 g  Status:  Discontinued        3.375 g 12.5 mL/hr over 240 Minutes  Intravenous Every 8 hours 03/17/21 1246 03/18/21 1201       Time spent: 25- minutes-Greater than 50% of this time was spent in counseling, explanation of diagnosis, planning of further management, and coordination of care.  MEDICATIONS: Scheduled Meds: . apixaban  5 mg Oral BID  . Chlorhexidine Gluconate Cloth  6 each Topical Daily  . darbepoetin (ARANESP) injection - NON-DIALYSIS  60 mcg Subcutaneous Q Sat-1800  . ezetimibe   10 mg Oral Daily  . insulin aspart  0-15 Units Subcutaneous TID WC  . insulin aspart  0-5 Units Subcutaneous QHS  . insulin glargine  20 Units Subcutaneous Daily  . multivitamin with minerals  1 tablet Oral Daily  . sodium bicarbonate  650 mg Oral BID  . sodium chloride flush  5 mL Intracatheter Q8H   Continuous Infusions:  PRN Meds:.bisacodyl, dextrose, HYDROmorphone (DILAUDID) injection, ipratropium, levalbuterol, [DISCONTINUED] ondansetron **OR** ondansetron (ZOFRAN) IV, oxyCODONE, senna-docusate, traZODone   PHYSICAL EXAM: Vital signs: Vitals:   04/05/21 2346 04/06/21 0342 04/06/21 0619 04/06/21 0721  BP: 136/70 133/70  (!) 141/70  Pulse: 70 68  65  Resp: 19 18  18   Temp: 98.5 F (36.9 C) 97.9 F (36.6 C)  97.9 F (36.6 C)  TempSrc: Oral Oral  Oral  SpO2: 99% 98%  100%  Weight:   78.8 kg   Height:       Filed Weights   04/02/21 0400 04/05/21 0546 04/06/21 0619  Weight: 89.7 kg 80.6 kg 78.8 kg   Body mass index is 28.04 kg/m.   Gen Exam:  Awake Alert, No new F.N deficits, Normal affect Ashley.AT,PERRAL Supple Neck,No JVD, No cervical lymphadenopathy appriciated.  Symmetrical Chest wall movement, Good air movement bilaterally, CTAB RRR,No Gallops, Rubs or new Murmurs, No Parasternal Heave +ve B.Sounds, Abd Soft, No tenderness, No organomegaly appriciated, No rebound - guarding or rigidity. No Cyanosis, Clubbing or edema, No new Rash or bruise   I have personally reviewed following labs and imaging studies  LABORATORY DATA:  Recent Labs  Lab 04/01/21 0049 04/03/21 0113 04/05/21 0034 04/06/21 0829  WBC 9.9 9.1 8.7 8.5  HGB 8.4* 8.8* 9.2* 9.9*  HCT 27.8* 29.1* 30.4* 32.5*  PLT 433* 450* 449* 480*  MCV 98.9 99.3 98.7 97.6  MCH 29.9 30.0 29.9 29.7  MCHC 30.2 30.2 30.3 30.5  RDW 20.5* 19.4* 17.8* 17.2*  LYMPHSABS  --   --   --  2.4  MONOABS  --   --   --  0.6  EOSABS  --   --   --  0.6*  BASOSABS  --   --   --  0.1    Recent Labs  Lab 04/02/21 0340  04/03/21 0113 04/04/21 0725 04/05/21 0034 04/06/21 0829  NA 140 138 138 138 139  K 4.6 4.5 3.9 4.0 3.6  CL 93* 93* 97* 98 100  CO2 37* 36* 31 29 30   GLUCOSE 71 164* 113* 139* 83  BUN 42* 38* 32* 33* 30*  CREATININE 2.44* 2.81* 2.59* 2.71* 2.45*  CALCIUM 9.4 9.2 8.9 9.1 8.9  AST  --   --   --   --  19  ALT  --   --   --   --  21  ALKPHOS  --   --   --   --  48  BILITOT  --   --   --   --  0.6  ALBUMIN  --   --   --   --  2.3*  MG  --   --   --   --  2.2  BNP  --   --   --   --  2,116.9*    Recent Labs  Lab 04/01/21 0049 04/03/21 0113 04/05/21 0034 04/06/21 0829  WBC 9.9 9.1 8.7 8.5  HGB 8.4* 8.8* 9.2* 9.9*  HCT 27.8* 29.1* 30.4* 32.5*  PLT 433* 450* 449* 480*  BNP  --   --   --  2,116.9*  AST  --   --   --  19  ALT  --   --   --  21  ALKPHOS  --   --   --  48  BILITOT  --   --   --  0.6  ALBUMIN  --   --   --  2.3*      GFR: Estimated Creatinine Clearance: 23 mL/min (A) (by C-G formula based on SCr of 2.45 mg/dL (H)).  Liver Function Tests: Recent Labs  Lab 04/06/21 0829  AST 19  ALT 21  ALKPHOS 48  BILITOT 0.6  PROT 6.5  ALBUMIN 2.3*   No results for input(s): LIPASE, AMYLASE in the last 168 hours. No results for input(s): AMMONIA in the last 168 hours.  Coagulation Profile: No results for input(s): INR, PROTIME in the last 168 hours.  Cardiac Enzymes: No results for input(s): CKTOTAL, CKMB, CKMBINDEX, TROPONINI in the last 168 hours.  BNP (last 3 results) No results for input(s): PROBNP in the last 8760 hours.  Lipid Profile: Recent Labs    04/05/21 0034  CHOL 92  HDL 37*  LDLCALC 39  TRIG 82  CHOLHDL 2.5    Thyroid Function Tests: No results for input(s): TSH, T4TOTAL, FREET4, T3FREE, THYROIDAB in the last 72 hours.  Anemia Panel: No results for input(s): VITAMINB12, FOLATE, FERRITIN, TIBC, IRON, RETICCTPCT in the last 72 hours.  Urine analysis:    Component Value Date/Time   COLORURINE AMBER (A) 03/17/2021 1208   APPEARANCEUR  CLOUDY (A) 03/17/2021 1208   LABSPEC 1.015 03/17/2021 1208   PHURINE 5.0 03/17/2021 1208   GLUCOSEU 50 (A) 03/17/2021 1208   HGBUR SMALL (A) 03/17/2021 1208   BILIRUBINUR NEGATIVE 03/17/2021 1208   KETONESUR NEGATIVE 03/17/2021 1208   PROTEINUR >=300 (A) 03/17/2021 1208   UROBILINOGEN 0.2 01/17/2013 1910   NITRITE NEGATIVE 03/17/2021 1208   LEUKOCYTESUR LARGE (A) 03/17/2021 1208    Sepsis Labs: Lactic Acid, Venous    Component Value Date/Time   LATICACIDVEN 1.4 03/21/2021 0805    MICROBIOLOGY: No results found for this or any previous visit (from the past 240 hour(s)).  RADIOLOGY STUDIES/RESULTS: CT HEAD WO CONTRAST  Result Date: 04/05/2021 CLINICAL DATA:  Follow-up infarct EXAM: CT HEAD WITHOUT CONTRAST TECHNIQUE: Contiguous axial images were obtained from the base of the skull through the vertex without intravenous contrast. COMPARISON:  Head CT from 2 days ago FINDINGS: Brain: Recent appearing parasagittal right occipital parietal cortically based infarct. Remote right parietal, right inferior frontal, and right occipital pole infarcts. No acute hemorrhage, hydrocephalus, or collection. Small remote bilateral cerebellar infarcts better depicted on prior. Vascular: Atheromatous calcification Skull: Normal. Negative for fracture or focal lesion. Sinuses/Orbits: No acute finding IMPRESSION: 1. Recent right occipital parietal infarct without hemorrhage or progression from 2 days ago. 2. Chronic right cerebral and cerebellar infarcts. Electronically Signed   By: Monte Fantasia M.D.   On: 04/05/2021 07:07   VAS US CAROTID  Result Date: 04/04/2021 Carotid Arterial Duplex Study Indications:  Recurrent Syncope. Risk Factors:      Hypertension, Diabetes. Other Factors:     CHF, atrial fibrillation, pacemaker. Comparison Study:  No prior study on file. Performing Technologist: Sharion Dove RVS  Examination Guidelines: A complete evaluation includes B-mode imaging, spectral Doppler, color  Doppler, and power Doppler as needed of all accessible portions of each vessel. Bilateral testing is considered an integral part of a complete examination. Limited examinations for reoccurring indications may be performed as noted.  Right Carotid Findings: +----------+--------+--------+--------+------------------+------------------+           PSV cm/sEDV cm/sStenosisPlaque DescriptionComments           +----------+--------+--------+--------+------------------+------------------+ CCA Prox  46      6                                                    +----------+--------+--------+--------+------------------+------------------+ CCA Distal57      9                                 intimal thickening +----------+--------+--------+--------+------------------+------------------+ ICA Prox  57      12              calcific                             +----------+--------+--------+--------+------------------+------------------+ ICA Distal73      20                                                   +----------+--------+--------+--------+------------------+------------------+ ECA       94      13                                                   +----------+--------+--------+--------+------------------+------------------+ +----------+--------+-------+--------+-------------------+           PSV cm/sEDV cmsDescribeArm Pressure (mmHG) +----------+--------+-------+--------+-------------------+ [id number]                                         +----------+--------+-------+--------+-------------------+ +---------+--------+--+--------+-+ VertebralPSV cm/s29EDV cm/s6 +---------+--------+--+--------+-+  Left Carotid Findings: +----------+--------+--------+--------+------------------+------------------+           PSV cm/sEDV cm/sStenosisPlaque DescriptionComments           +----------+--------+--------+--------+------------------+------------------+ CCA Prox  61       12                                intimal thickening +----------+--------+--------+--------+------------------+------------------+ CCA Distal48      13                                intimal thickening +----------+--------+--------+--------+------------------+------------------+ ICA Prox  55      23              heterogenous                         +----------+--------+--------+--------+------------------+------------------+  ICA Distal48      19                                                   +----------+--------+--------+--------+------------------+------------------+ ECA       93      11                                                   +----------+--------+--------+--------+------------------+------------------+ +----------+--------+--------+--------+-------------------+           PSV cm/sEDV cm/sDescribeArm Pressure (mmHG) +----------+--------+--------+--------+-------------------+ [id number]                                          +----------+--------+--------+--------+-------------------+ +---------+--------+--+--------+-+ VertebralPSV cm/s34EDV cm/s8 +---------+--------+--+--------+-+   Summary: Right Carotid: The extracranial vessels were near-normal with only minimal wall                thickening or plaque. Left Carotid: The extracranial vessels were near-normal with only minimal wall               thickening or plaque. Vertebrals:  Bilateral vertebral arteries demonstrate antegrade flow. Subclavians: Normal flow hemodynamics were seen in bilateral subclavian              arteries. *See table(s) above for measurements and observations.  Electronically signed by Antony Contras MD on 04/04/2021 at 2:22:12 PM.    Final      LOS: 21 days   Lala Lund, MD  [facility]  04/06/2021, 9:58 AM

## 2021-04-06 NOTE — Progress Notes (Signed)
Referring Physician(s): Elgergawy,D  Supervising Physician: Corrie Mckusick  Patient Status:  [facility] - In-pt  Chief Complaint: Abdominal pain, cholecystitis  Subjective: Alert, sitting up in bed.  Conversant.  Mild tenderness under the drain site, otherwise intact. Ongoing significant output daily.  Remains inpatient for Neuro work-up.   Allergies: Patient has no known allergies.  Medications: Prior to Admission medications   Medication Sig Start Date End Date Taking? Authorizing Provider  apixaban (ELIQUIS) 5 MG TABS tablet Take 5 mg by mouth 2 (two) times daily.   Yes [provider]  ezetimibe (ZETIA) 10 MG tablet Take 10 mg by mouth daily.   Yes [provider]  furosemide (LASIX) 20 MG tablet Take 20 mg by mouth.   Yes [provider]  glipiZIDE (GLUCOTROL XL) 5 MG 24 hr tablet Take 5 mg by mouth daily with breakfast.   Yes [provider]  insulin glargine (LANTUS) 100 UNIT/ML injection Inject 50 Units into the skin at bedtime.   Yes [provider]  insulin NPH-regular (NOVOLIN 70/30) (70-30) 100 UNIT/ML injection Inject 10 Units into the skin 3 (three) times daily after meals.   Yes [provider]     Vital Signs: BP (!) 151/66 (BP Location: Right Arm)   Pulse 61   Temp 98.3 F (36.8 C) (Oral)   Resp 12   Ht 5\' 6"  (1.676 m)   Wt 173 lb 11.6 oz (78.8 kg)   SpO2 98%   BMI 28.04 kg/m   Physical Exam  NAD, alert, eating lunch Abdomen:  Perc chole in place. Insertion site stable. Mildly tender.  Bilious output in bag.   Imaging: CT HEAD WO CONTRAST  Result Date: 04/05/2021 CLINICAL DATA:  Follow-up infarct EXAM: CT HEAD WITHOUT CONTRAST TECHNIQUE: Contiguous axial images were obtained from the base of the skull through the vertex without intravenous contrast. COMPARISON:  Head CT from 2 days ago FINDINGS: Brain: Recent appearing parasagittal right occipital parietal cortically based infarct. Remote right  parietal, right inferior frontal, and right occipital pole infarcts. No acute hemorrhage, hydrocephalus, or collection. Small remote bilateral cerebellar infarcts better depicted on prior. Vascular: Atheromatous calcification Skull: Normal. Negative for fracture or focal lesion. Sinuses/Orbits: No acute finding IMPRESSION: 1. Recent right occipital parietal infarct without hemorrhage or progression from 2 days ago. 2. Chronic right cerebral and cerebellar infarcts. Electronically Signed   By: Monte Fantasia M.D.   On: 04/05/2021 07:07   CT HEAD WO CONTRAST  Result Date: 04/03/2021 CLINICAL DATA:  Dizziness EXAM: CT HEAD WITHOUT CONTRAST TECHNIQUE: Contiguous axial images were obtained from the base of the skull through the vertex without intravenous contrast. COMPARISON:  None. FINDINGS: Brain: There is encephalomalacia in the right inferior frontal, occipital and parietal lobes. There is an age indeterminate but likely chronic infarct left cerebellum. There is periventricular hypoattenuation compatible with chronic microvascular disease. No hemorrhage or extra-axial collection. No midline shift, other mass effect or hydrocephalus. Vascular: Atherosclerotic calcification of the vertebral and internal carotid arteries at the skull base. No abnormal hyperdensity of the major intracranial arteries or dural venous sinuses. Skull: The visualized skull base, calvarium and extracranial soft tissues are normal. Sinuses/Orbits: No fluid levels or advanced mucosal thickening of the visualized paranasal sinuses. No mastoid or middle ear effusion. The orbits are normal. IMPRESSION: 1. No intracranial hemorrhage or mass effect. 2. Multiple subacute to chronic infarcts within the right cerebral and left cerebellar hemispheres. MRI recommended for better temporal characterization. 3. Chronic microvascular ischemia. Electronically  Signed   By: Ulyses Jarred M.D.   On: 04/03/2021 19:03   EEG adult  Result Date:  04/03/2021 Plancher, Baron Sane, MD     04/06/2021  3:13 PM PROCEDURE: EEG routine 25 minutes with video study DATES OF TEST: 04/03/2021 REASON FOR TEST: Syncope AED/Sedative MEDICATIONS: n/a TECHNIQUE: This is an 18 channel digital EEG recording using the standard international 10/20 system of electrode placement with one channel EKG recording. Pt was awake and drowsy during this recording. FINDINGS: Background rhythm:symmetric diffuse 5-7 Hz Amplitude : normal Continuity: continuous Breach effect:  NO Variability: YES Reactivity: YES Rhythmic delta activity: NO Periodic discharges: NO Sporadic epileptiform discharges: NO Electrographic/electroclinical seizures: NO Limited EKG reveals no abnormalities. IMPRESSION: This is an abnormal study due to - 1. Mild Generalized slowing is a non-specific finding consistent with a generalized disturbance of cerebral functioning including toxic, metabolic, or structural abnormalities that are multi-focal or diffuse. 2. No definite epileptiform discharges or electrographic seizures noted.   VAS US CAROTID  Result Date: 04/04/2021 Carotid Arterial Duplex Study Indications:       Recurrent Syncope. Risk Factors:      Hypertension, Diabetes. Other Factors:     CHF, atrial fibrillation, pacemaker. Comparison Study:  No prior study on file. Performing Technologist: Sharion Dove RVS  Examination Guidelines: A complete evaluation includes B-mode imaging, spectral Doppler, color Doppler, and power Doppler as needed of all accessible portions of each vessel. Bilateral testing is considered an integral part of a complete examination. Limited examinations for reoccurring indications may be performed as noted.  Right Carotid Findings: +----------+--------+--------+--------+------------------+------------------+           PSV cm/sEDV cm/sStenosisPlaque DescriptionComments           +----------+--------+--------+--------+------------------+------------------+ CCA Prox  46      6                                                     +----------+--------+--------+--------+------------------+------------------+ CCA Distal57      9                                 intimal thickening +----------+--------+--------+--------+------------------+------------------+ ICA Prox  57      12              calcific                             +----------+--------+--------+--------+------------------+------------------+ ICA Distal73      20                                                   +----------+--------+--------+--------+------------------+------------------+ ECA       94      13                                                   +----------+--------+--------+--------+------------------+------------------+ +----------+--------+-------+--------+-------------------+           PSV cm/sEDV cmsDescribeArm Pressure (mmHG) +----------+--------+-------+--------+-------------------+ [id number]                                         +----------+--------+-------+--------+-------------------+ +---------+--------+--+--------+-+  VertebralPSV cm/s29EDV cm/s6 +---------+--------+--+--------+-+  Left Carotid Findings: +----------+--------+--------+--------+------------------+------------------+           PSV cm/sEDV cm/sStenosisPlaque DescriptionComments           +----------+--------+--------+--------+------------------+------------------+ CCA Prox  61      12                                intimal thickening +----------+--------+--------+--------+------------------+------------------+ CCA Distal48      13                                intimal thickening +----------+--------+--------+--------+------------------+------------------+ ICA Prox  55      23              heterogenous                         +----------+--------+--------+--------+------------------+------------------+ ICA Distal48      19                                                    +----------+--------+--------+--------+------------------+------------------+ ECA       93      11                                                   +----------+--------+--------+--------+------------------+------------------+ +----------+--------+--------+--------+-------------------+           PSV cm/sEDV cm/sDescribeArm Pressure (mmHG) +----------+--------+--------+--------+-------------------+ [id number]                                          +----------+--------+--------+--------+-------------------+ +---------+--------+--+--------+-+ VertebralPSV cm/s34EDV cm/s8 +---------+--------+--+--------+-+   Summary: Right Carotid: The extracranial vessels were near-normal with only minimal wall                thickening or plaque. Left Carotid: The extracranial vessels were near-normal with only minimal wall               thickening or plaque. Vertebrals:  Bilateral vertebral arteries demonstrate antegrade flow. Subclavians: Normal flow hemodynamics were seen in bilateral subclavian              arteries. *See table(s) above for measurements and observations.  Electronically signed by Antony Contras MD on 04/04/2021 at 2:22:12 PM.    Final     Labs:  CBC: Recent Labs    04/01/21 0049 04/03/21 0113 04/05/21 0034 04/06/21 0829  WBC 9.9 9.1 8.7 8.5  HGB 8.4* 8.8* 9.2* 9.9*  HCT 27.8* 29.1* 30.4* 32.5*  PLT 433* 450* 449* 480*    COAGS: Recent Labs    03/18/21 0630 03/19/21 0504 03/20/21 0147 03/21/21 0115  INR 1.6* 1.4* 1.6* 1.5*    BMP: Recent Labs    04/03/21 0113 04/04/21 0725 04/05/21 0034 04/06/21 0829  NA 138 138 138 139  K 4.5 3.9 4.0 3.6  CL 93* 97* 98 100  CO2 36* 31 29 30   GLUCOSE 164* 113* 139* 83  BUN 38* 32* 33* 30*  CALCIUM 9.2 8.9 9.1 8.9  CREATININE 2.81* 2.59* 2.71* 2.45*  GFRNONAA 18* 19* 18* 21*    LIVER FUNCTION TESTS: Recent Labs    03/24/21 0113 03/25/21 0217 03/26/21 0055 04/06/21 0829  BILITOT 1.0 1.1 1.0 0.6  AST  62* 34 27 19  ALT 370* 272* 200* 21  ALKPHOS 47 45 39 48  PROT 5.5* 5.6* 5.7* 6.5  ALBUMIN 1.9* 1.9* 1.8* 2.3*    Assessment and Plan: Acute calculus cholecystitis, poor surgical candidate; s/p percutaneous cholecystostomy drain placement 3/24 Afebrile.  Sitting up in bed, eating lunch, conversant.  Follow-up order has already been put in place; drain will need to remain in place at least 4-6 weeks unless removed surgically in interim; f/u cholangiogram/drain exchange ordered in 6-8 weeks; if problems occur with drain in the meantime can evaluate with cholangiogram sooner.  Could be considered for spyglass stone removal with Dr. Serafina Royals. Initial follow-up to be scheduled with Dr. Serafina Royals.   Continue current management.   Electronically Signed: Docia Barrier, PA 04/06/2021, 4:08 PM   I spent a total of 15 minutes at the the patient's bedside AND on the patient's hospital floor or unit, greater than 50% of which was counseling/coordinating care for acute cholecystitis.

## 2021-04-07 DIAGNOSIS — I6621 Occlusion and stenosis of right posterior cerebral artery: Secondary | ICD-10-CM | POA: Diagnosis not present

## 2021-04-07 DIAGNOSIS — K810 Acute cholecystitis: Secondary | ICD-10-CM | POA: Diagnosis not present

## 2021-04-07 DIAGNOSIS — N390 Urinary tract infection, site not specified: Secondary | ICD-10-CM | POA: Diagnosis not present

## 2021-04-07 LAB — CBC WITH DIFFERENTIAL/PLATELET
Abs Immature Granulocytes: 0.02 10*3/uL (ref 0.00–0.07)
Basophils Absolute: 0.1 10*3/uL (ref 0.0–0.1)
Basophils Relative: 1 %
Eosinophils Absolute: 0.5 10*3/uL (ref 0.0–0.5)
Eosinophils Relative: 6 %
HCT: 33.9 % — ABNORMAL LOW (ref 36.0–46.0)
Hemoglobin: 10.2 g/dL — ABNORMAL LOW (ref 12.0–15.0)
Immature Granulocytes: 0 %
Lymphocytes Relative: 24 %
Lymphs Abs: 2 10*3/uL (ref 0.7–4.0)
MCH: 29.8 pg (ref 26.0–34.0)
MCHC: 30.1 g/dL (ref 30.0–36.0)
MCV: 99.1 fL (ref 80.0–100.0)
Monocytes Absolute: 0.7 10*3/uL (ref 0.1–1.0)
Monocytes Relative: 8 %
Neutro Abs: 5.2 10*3/uL (ref 1.7–7.7)
Neutrophils Relative %: 61 %
Platelets: 477 10*3/uL — ABNORMAL HIGH (ref 150–400)
RBC: 3.42 MIL/uL — ABNORMAL LOW (ref 3.87–5.11)
RDW: 17.1 % — ABNORMAL HIGH (ref 11.5–15.5)
WBC: 8.5 10*3/uL (ref 4.0–10.5)
nRBC: 0 % (ref 0.0–0.2)

## 2021-04-07 LAB — GLUCOSE, CAPILLARY
Glucose-Capillary: 120 mg/dL — ABNORMAL HIGH (ref 70–99)
Glucose-Capillary: 195 mg/dL — ABNORMAL HIGH (ref 70–99)
Glucose-Capillary: 232 mg/dL — ABNORMAL HIGH (ref 70–99)
Glucose-Capillary: 227 mg/dL — ABNORMAL HIGH (ref 70–99)

## 2021-04-07 LAB — BRAIN NATRIURETIC PEPTIDE: B Natriuretic Peptide: 2160 pg/mL — ABNORMAL HIGH (ref 0.0–100.0)

## 2021-04-07 LAB — COMPREHENSIVE METABOLIC PANEL
ALT: 20 U/L (ref 0–44)
AST: 23 U/L (ref 15–41)
Albumin: 2.5 g/dL — ABNORMAL LOW (ref 3.5–5.0)
Alkaline Phosphatase: 52 U/L (ref 38–126)
Anion gap: 11 (ref 5–15)
BUN: 30 mg/dL — ABNORMAL HIGH (ref 8–23)
CO2: 26 mmol/L (ref 22–32)
Calcium: 9.1 mg/dL (ref 8.9–10.3)
Chloride: 100 mmol/L (ref 98–111)
Creatinine, Ser: 2.39 mg/dL — ABNORMAL HIGH (ref 0.44–1.00)
GFR, Estimated: 21 mL/min — ABNORMAL LOW (ref >60)
Glucose, Bld: 160 mg/dL — ABNORMAL HIGH (ref 70–99)
Potassium: 3.9 mmol/L (ref 3.5–5.1)
Sodium: 137 mmol/L (ref 135–145)
Total Bilirubin: 0.8 mg/dL (ref 0.3–1.2)
Total Protein: 6.6 g/dL (ref 6.5–8.1)

## 2021-04-07 LAB — MAGNESIUM: Magnesium: 2.2 mg/dL (ref 1.7–2.4)

## 2021-04-07 MED ORDER — CARVEDILOL 3.125 MG PO TABS
3.1250 mg | ORAL_TABLET | Freq: Two times a day (BID) | ORAL | Status: DC
  Administered 2021-04-07 – 2021-04-08 (×2): 3.125 mg via ORAL
  Filled 2021-04-07 (×2): qty 1

## 2021-04-07 NOTE — Progress Notes (Signed)
Tracey Bryant he also STROKE TEAM PROGRESS NOTE   INTERVAL HISTORY Patient is resting comfortably in bedside chair.  EEG shows mild generalized slowing but no epileptiform activity.  MRI scan of the brain shows mostly chronic cortical infarct in the right occipital, parietal and inferolateral right frontal lobe with a question of small subacute component at the periphery of the right occipital infarct.  There is also chronic hemosiderin depositions in this infarcts and chronic lacunar infarcts within the basal ganglia and thalami.  MR angiogram of the brain shows moderate focal stenosis of distal cavernous paraclinoid left ICA, M2 distal middle cerebral arteries bilaterally focal high-grade stenosis of left M2 branch and bilateral A2 branches high-grade stenosis of proximal left PICA and proximal superior cerebellar arteries bilaterally.  Patient denies any symptoms of recent stroke or focal neurological deficits.  Main complaint is passing out without warning.  Vitals:   04/07/21 0416 04/07/21 0500 04/07/21 0717 04/07/21 1144  BP: (!) 147/76   (!) 143/68  Pulse: 74  75 66  Resp: 18  17 16   Temp: 98 F (36.7 C)  98 F (36.7 C) 98.5 F (36.9 C)  TempSrc: Axillary  Axillary Oral  SpO2: 94%  92% 98%  Weight:  78.9 kg    Height:       CBC:  Recent Labs  Lab 04/06/21 0829 04/07/21 0110  WBC 8.5 8.5  NEUTROABS 4.8 5.2  HGB 9.9* 10.2*  HCT 32.5* 33.9*  MCV 97.6 99.1  PLT 480* 403*   Basic Metabolic Panel:  Recent Labs  Lab 04/06/21 0829 04/07/21 0110  NA 139 137  K 3.6 3.9  CL 100 100  CO2 30 26  GLUCOSE 83 160*  BUN 30* 30*  CREATININE 2.45* 2.39*  CALCIUM 8.9 9.1  MG 2.2 2.2   Lipid Panel:  Recent Labs  Lab 04/05/21 0034  CHOL 92  TRIG 82  HDL 37*  CHOLHDL 2.5  VLDL 16  LDLCALC 39    IMAGING past 24 hours MR ANGIO HEAD WO CONTRAST  Result Date: 04/06/2021 CLINICAL DATA:  Dizziness, nonspecific.  Stroke, follow-up. EXAM: MRI HEAD WITHOUT CONTRAST MRA HEAD WITHOUT  CONTRAST TECHNIQUE: Multiplanar, multiecho pulse sequences of the brain and surrounding structures were obtained without intravenous contrast. Angiographic images of the head were obtained using MRA technique without contrast. COMPARISON:  Prior head CT examinations 04/05/2021 and earlier. FINDINGS: MRI HEAD FINDINGS Brain: Mild intermittent motion degradation. Mild cerebral and cerebellar atrophy. There is subtle gyriform restricted diffusion within the paramedian right occipital lobe measuring 3 cm in greatest dimension, most consistent with a subacute infarct (for instance as seen on series 4, image 22). Redemonstrated chronic cortically based infarcts within the right occipital pole, right parietal lobe and inferolateral right frontal lobe. There is a small amount of chronic hemosiderin deposition at site of chronic right occipital and parietal lobe infarcts. Small chronic lacunar infarcts within the basal ganglia and thalami bilaterally. Background mild multifocal T2/FLAIR hyperintensity within the cerebral white matter which is nonspecific, but compatible with chronic small vessel ischemic disease. Small chronic infarcts within the inferior left cerebellar hemisphere. No evidence of intracranial mass. No extra-axial fluid collection. No midline shift. Vascular: Expected proximal arterial flow voids. Skull and upper cervical spine: No focal marrow lesion. Sinuses/Orbits: Visualized orbits show no acute finding. Mild bilateral ethmoid sinus mucosal thickening. Small right maxillary sinus mucous retention cyst. Other: Left mastoid effusion. Trace fluid is also present within the right mastoid air cells. MRA HEAD FINDINGS The intracranial internal  carotid arteries are patent. Atherosclerotic irregularity of both vessels. Most notably, there is moderate focal stenosis of the distal cavernous/paraclinoid left ICA. The M1 middle cerebral arteries are patent. No M2 proximal branch occlusion is identified.  Atherosclerotic irregularity of the M2 and more distal middle cerebral arteries bilaterally. Most notably, there is a high-grade focal stenosis within a proximal left M2 MCA branch (series 306, image 7). The anterior cerebral arteries are patent. Moderate focal stenosis within the distal aspect of the left A1 segment. Mild-to-moderate stenoses within the bilateral A2 segments. 1-2 mm inferiorly projecting vascular protrusion arising from the paraclinoid right ICA which may reflect an aneurysm or infundibulum (series 3, images 80 and 79). 1-2 mm inferiorly projecting vascular protrusion arising from the mid M1 left middle cerebral artery which may reflect an aneurysm or infundibulum (series 3, image 87) (series 306, image 13). The non dominant intracranial right vertebral artery terminates predominantly as the right PICA (with a possible small contribution to the basilar artery. The dominant intracranial left vertebral artery is patent without stenosis. The basilar artery is patent. High-grade stenosis within the proximal left PICA (series 307, image 6). Sites of apparent high-grade stenosis within the proximal superior cerebellar arteries bilaterally. The right posterior cerebral artery is patent proximally with diffuse atherosclerotic irregularity. Most notably, there is a high-grade focal stenosis within the P1 right PCA. The distal right posterior cerebral artery is poorly delineated and may be occluded. Markedly irregular and stenotic appearance of the P1 left posterior cerebral artery. Distal to this, the left PCA is poorly delineated, likely secondary to high-grade stenosis or occlusion. Posterior communicating arteries are hypoplastic or absent bilaterally. IMPRESSION: MRI brain: 1. Mildly motion degraded exam. 2. 3 cm subacute appearing cortically based infarct within the medial right occipital lobe, unchanged in extent from the head CT of 04/05/2021. 3. Chronic cortically based infarcts within the right  parietal, right occipital and inferolateral right frontal lobes. 4. Chronic lacunar infarcts within the bilateral basal ganglia and thalami. 5. Background mild cerebral white matter chronic small vessel ischemic disease. 6. Small chronic infarcts within the inferior left cerebellar hemisphere. 7. Mild paranasal sinus disease, as described. 8. Left mastoid effusion. MRA head: 1. Advanced intracranial atherosclerotic disease, as outlined and most notably as follows. 2. 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 3. Sites of apparent severe stenosis within the proximal superior cerebellar arteries bilaterally. 4. The right posterior cerebral artery is patent proximally. However, there is a high-grade focal stenosis within the right P1 segment. The distal right posterior cerebral artery is poorly delineated and may be occluded. 5. Markedly irregular and stenotic appearance of the P1 left posterior cerebral artery. Distal to this, the left posterior cerebral artery is poorly delineated, likely secondary to high-grade stenosis or occlusion. 6. Moderate focal stenosis of the distal cavernous/paraclinoid left ICA. 7. High-grade focal stenosis within a proximal left M2 MCA vessel. 8. Moderate focal stenosis within the distal A1 segment of the left anterior cerebral artery. 9. 1-2 mm inferiorly projecting vascular protrusion arising from the paraclinoid right ICA, which may reflect an aneurysm or infundibulum. 10. 1-2 mm inferiorly projecting vascular protrusion arising from the mid M1 left middle cerebral artery, which may reflect an aneurysm or infundibulum. Electronically Signed   By: Kellie Simmering DO   On: 04/06/2021 20:43   MR BRAIN WO CONTRAST  Result Date: 04/06/2021 CLINICAL DATA:  Dizziness, nonspecific.  Stroke, follow-up. EXAM: MRI HEAD WITHOUT CONTRAST MRA HEAD WITHOUT CONTRAST TECHNIQUE: Multiplanar, multiecho pulse sequences of the  brain and surrounding structures were obtained without intravenous  contrast. Angiographic images of the head were obtained using MRA technique without contrast. COMPARISON:  Prior head CT examinations 04/05/2021 and earlier. FINDINGS: MRI HEAD FINDINGS Brain: Mild intermittent motion degradation. Mild cerebral and cerebellar atrophy. There is subtle gyriform restricted diffusion within the paramedian right occipital lobe measuring 3 cm in greatest dimension, most consistent with a subacute infarct (for instance as seen on series 4, image 22). Redemonstrated chronic cortically based infarcts within the right occipital pole, right parietal lobe and inferolateral right frontal lobe. There is a small amount of chronic hemosiderin deposition at site of chronic right occipital and parietal lobe infarcts. Small chronic lacunar infarcts within the basal ganglia and thalami bilaterally. Background mild multifocal T2/FLAIR hyperintensity within the cerebral white matter which is nonspecific, but compatible with chronic small vessel ischemic disease. Small chronic infarcts within the inferior left cerebellar hemisphere. No evidence of intracranial mass. No extra-axial fluid collection. No midline shift. Vascular: Expected proximal arterial flow voids. Skull and upper cervical spine: No focal marrow lesion. Sinuses/Orbits: Visualized orbits show no acute finding. Mild bilateral ethmoid sinus mucosal thickening. Small right maxillary sinus mucous retention cyst. Other: Left mastoid effusion. Trace fluid is also present within the right mastoid air cells. MRA HEAD FINDINGS The intracranial internal carotid arteries are patent. Atherosclerotic irregularity of both vessels. Most notably, there is moderate focal stenosis of the distal cavernous/paraclinoid left ICA. The M1 middle cerebral arteries are patent. No M2 proximal branch occlusion is identified. Atherosclerotic irregularity of the M2 and more distal middle cerebral arteries bilaterally. Most notably, there is a high-grade focal stenosis  within a proximal left M2 MCA branch (series 306, image 7). The anterior cerebral arteries are patent. Moderate focal stenosis within the distal aspect of the left A1 segment. Mild-to-moderate stenoses within the bilateral A2 segments. 1-2 mm inferiorly projecting vascular protrusion arising from the paraclinoid right ICA which may reflect an aneurysm or infundibulum (series 3, images 80 and 79). 1-2 mm inferiorly projecting vascular protrusion arising from the mid M1 left middle cerebral artery which may reflect an aneurysm or infundibulum (series 3, image 87) (series 306, image 13). The non dominant intracranial right vertebral artery terminates predominantly as the right PICA (with a possible small contribution to the basilar artery. The dominant intracranial left vertebral artery is patent without stenosis. The basilar artery is patent. High-grade stenosis within the proximal left PICA (series 307, image 6). Sites of apparent high-grade stenosis within the proximal superior cerebellar arteries bilaterally. The right posterior cerebral artery is patent proximally with diffuse atherosclerotic irregularity. Most notably, there is a high-grade focal stenosis within the P1 right PCA. The distal right posterior cerebral artery is poorly delineated and may be occluded. Markedly irregular and stenotic appearance of the P1 left posterior cerebral artery. Distal to this, the left PCA is poorly delineated, likely secondary to high-grade stenosis or occlusion. Posterior communicating arteries are hypoplastic or absent bilaterally. IMPRESSION: MRI brain: 1. Mildly motion degraded exam. 2. 3 cm subacute appearing cortically based infarct within the medial right occipital lobe, unchanged in extent from the head CT of 04/05/2021. 3. Chronic cortically based infarcts within the right parietal, right occipital and inferolateral right frontal lobes. 4. Chronic lacunar infarcts within the bilateral basal ganglia and thalami. 5.  Background mild cerebral white matter chronic small vessel ischemic disease. 6. Small chronic infarcts within the inferior left cerebellar hemisphere. 7. Mild paranasal sinus disease, as described. 8. Left mastoid effusion. MRA head: 1.  Advanced intracranial atherosclerotic disease, as outlined and most notably as follows. 2. 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 3. Sites of apparent severe stenosis within the proximal superior cerebellar arteries bilaterally. 4. The right posterior cerebral artery is patent proximally. However, there is a high-grade focal stenosis within the right P1 segment. The distal right posterior cerebral artery is poorly delineated and may be occluded. 5. Markedly irregular and stenotic appearance of the P1 left posterior cerebral artery. Distal to this, the left posterior cerebral artery is poorly delineated, likely secondary to high-grade stenosis or occlusion. 6. Moderate focal stenosis of the distal cavernous/paraclinoid left ICA. 7. High-grade focal stenosis within a proximal left M2 MCA vessel. 8. Moderate focal stenosis within the distal A1 segment of the left anterior cerebral artery. 9. 1-2 mm inferiorly projecting vascular protrusion arising from the paraclinoid right ICA, which may reflect an aneurysm or infundibulum. 10. 1-2 mm inferiorly projecting vascular protrusion arising from the mid M1 left middle cerebral artery, which may reflect an aneurysm or infundibulum. Electronically Signed   By: Kellie Simmering DO   On: 04/06/2021 20:43   NM Pulmonary Perf and Vent  Result Date: 04/06/2021 CLINICAL DATA:  Chest pain, history of DVT EXAM: NUCLEAR MEDICINE PERFUSION LUNG SCAN TECHNIQUE: Perfusion images were obtained in multiple projections after intravenous injection of radiopharmaceutical. Ventilation scans intentionally deferred if perfusion scan and chest x-ray adequate for interpretation during COVID 19 epidemic. RADIOPHARMACEUTICALS:  4.4 mCi Tc-70m MAA IV  COMPARISON:  04/06/2021 FINDINGS: Planar images in multiple projections are obtained during the perfusion phase of the exam. There are no perfusion defects to suggest pulmonary embolus. IMPRESSION: 1. No evidence of pulmonary embolus. Electronically Signed   By: Randa Ngo M.D.   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 Cardiac silhouette is unremarkable. Trace bilateral pleural effusions are noted. Patchy left lower lobe consolidation could reflect airspace disease or atelectasis. Continued central vascular congestion. No pneumothorax. No acute bony abnormalities. IMPRESSION: 1. Findings favoring mild volume overload, with central vascular congestion and trace bilateral effusions. 2. Retrocardiac opacity consistent with atelectasis or airspace disease. Electronically Signed   By: Randa Ngo M.D.   On: 04/06/2021 20:09    PHYSICAL EXAM Constitutional: Pleasant elderly mildly obese Caucasian lady not in distress Cardiovascular: Normal RR Respiratory: No increased WOB   Mental status: Awake alert oriented to time, place, sitation and person. Speech: Normal without aphasia or dysarthria Cranial nerves: Pupils 63mm PERRL. Extraocular movements are full range without nystagmus.  Blinks to threat bilaterally.  Dense left homonymous hemianopsia.  Sensation in face intact bilaterally. Smile is symmetric. Soft palate elevation is symmetric. Shoulder shrug is symmetric. Patient tongue is midline.  Motor: Normal bulk and tone. No drift. BUE 3/5 BLE 3-/5.  Coordination: FTN intact bilaterally.  Sensation normal bilaterally, extinction intact Gait: Deferred,   ASSESSMENT/PLAN Ms. Tracey Bryant is a 70 y.o. female w/pmh of type 2 diabetes mellitus, hypertension,  atrial fibrillation on Eliquis, chronic systolic heart failure with permanent pacemaker, and multiple strokes- last in May 2021 with initial right hemiparesis that has since improved who presented to the hospital on 3/21 for acute calculus cholecystitis s/p percutaneous drain placementcomplicated by sepsis s/p 10 days of antibiotic treatment.Hospitalization complicated by an acute kidney injury, anemia, volume  overload, LLE DVT (age indeterminate), and multiple syncopal spells. Patient's frequent falls are concerning for cardiac etiology which can increase patient risk for stroke. Patient has a known hx of A fib and has been taking Eliquis.   #Stroke subacute right PCA and left cerebellum likely of cardioembolic etiology given cardiomyopathy.  Also suspicion for possible arrhythmias given history of multiple syncopal episodes.  CT Head: No intracranial hemorrhage or mass effect but Multiple subacute to chronic infarcts within the right cerebral and left cerebellar hemispheres.  MRI mostly chronic right parieto-occipital infarct with a small subacute component as well as bilateral basal ganglia and thalamic infarcts as well of remote age.  MRA diffuse intracranial atherosclerosis involving origin of PICA, bilateral superior cerebellar arteries, posterior cerebral arteries as well as M2 and A2 branches.  Carotid Doppler  no significant extracranial stenosis  2D Echo 03/18/2021: LVEF 25-30% w/ global hypokinesis.   EEG mild generalized slowing which is a nonspecific finding.  No seizure activity.    LDL 39  HgbA1c 9.1  VTE prophylaxis - SCDs    Diet   Diet heart healthy/carb modified Room service appropriate? Yes; Fluid consistency: Thin; Fluid restriction: 1500 mL Fluid    Eliquis (apixaban) daily prior to admission, now on Eliquis (apixaban) daily.   Therapy recommendations:  PT/OT eval. Attempt Tx for vertigo  Disposition:  Pending   Hyperlipidemia  Home meds:  zetia 10mg , resumed  in hospital  LDL 39, goal < 70  Continue at discharge  Diabetes type II   Home meds:  Glipizide 5mg , Lantus 50 U QHS, and NOVOLIN 70/30 10 U TID  HgbA1c 9.1, goal < 7.0  CBGs Recent Labs    04/06/21 2014 04/07/21 0719 04/07/21 1146  GLUCAP 237* 120* 195*      SSI  Other Stroke Risk Factors  Advanced Age >/= 25   Hx stroke  Congestive heart failure   Hospital day # 22   Patient's neurovascular imaging which suggest multiple remote age mostly chronic cortical as well as basal ganglia infarct due to combination of large vessel disease from intracranial atherosclerosis or cardio genic embolism as well as small vessel disease.  She has not had any recent focal neurological symptoms suggestive of stroke and her recurrent episodes of brief passing out likely syncopal in nature.  EEG does not show any seizure activity.  Recommend continue Eliquis and aggressive risk factor modification.  Management of syncope as per primary team.  Consider low-dose beta-blockers or a trial of Mestinon.  Long discussion with patient as well as with Dr. Candiss Norse and answered questions.  Stroke team will sign off.  Kindly call for questions.  Greater than 50% time during this 25-minute visit was spent in counseling and coordination of care about her syncopal episodes and multiple remote brain strokes and intracranial atherosclerosis discussion and answering questions.  Antony Contras, MD Medical Director [facility] Pager: [phone] 04/07/2021 2:10 PM  To contact Stroke Continuity provider, please refer to [email or web address] After hours, contact General Neurology

## 2021-04-07 NOTE — Progress Notes (Signed)
Physical Therapy Treatment Patient Details Name: Tracey Bryant MRN: [id number] DOB: 10-09-1951 Today's Date: 04/07/2021    History of Present Illness 70 y.o. female with presented to [facility] ED on 3/21  with nausea/vomiting/abdominal pain-further evaluation revealed acute calculus cholecystitis. Transferred to [facility] 3/24 for percutaneous gallbladder drain placement  Hospital course complicated by AKI, multifactorial anemia, and volume overload PMH: past medical history of DM-2, HTN, A. fib, chronic systolic heart failure, s/p permanent pacemaker placement    PT Comments    Pt admitted with above diagnosis. Pt treatment limited as pt is difficult to complete assessments on due to strange onset of symptoms.  Pt had most symptoms today when she rotated head side to side.  Unsure if pt has some issue with blood flow? [place] presentation. Pt was not orthostatic today during this session. 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 Will continue PT as able.   Pt currently with functional limitations due to balance and endurance deficits. Pt will benefit from skilled PT to increase their independence and safety with mobility to allow discharge to the venue listed below.     Follow Up Recommendations  SNF     Equipment Recommendations  None recommended by PT    Recommendations for Other Services       Precautions / Restrictions Precautions Precautions: Fall Precaution Comments: monitor BP, hemoglobin, B knee buckling with transfers, frequent syncopal episodes Restrictions Weight Bearing Restrictions: No    Mobility  Bed Mobility Overal bed mobility: Needs Assistance Bed Mobility: Supine to Sit     Supine to sit: Supervision;HOB elevated     General bed mobility comments: [place] checked all canals for BPPV and negative testing. Various adjustments of HOB degrees to assess tolerance. Pt  progressively inclined to 65 degrees. See BPS below. Not orthostatic. Pt tolerated most of treatment well until this PT had pt doing gaze stabiity exercises.  Once pt performed a few times, she said she was becoming sleepy.  She then closed her eyes.  Unsure if pt possibly does have a bil hypofunction or some decr blood supply with head movements.  Terminated the exercise at that point.    Transfers                    Ambulation/Gait                 Stairs             Wheelchair Mobility    Modified Rankin (Stroke Patients Only)       Balance                                            Cognition Arousal/Alertness: Awake/alert Behavior During Therapy: Anxious Overall Cognitive Status: Within Functional Limits for tasks assessed                                 General Comments: anxious about repeated passing out      Exercises      General Comments General comments (skin integrity, edema, etc.): 68, 96%, 157/73 supine, 157/74 siting 45 degrees, 65 degrees 157/66      Pertinent Vitals/Pain      Home Living  Prior Function            PT Goals (current goals can now be found in the care plan section) Acute Rehab PT Goals Patient Stated Goal: get up without passing out Progress towards PT goals: Progressing toward goals    Frequency    Min 3X/week      PT Plan Current plan remains appropriate    Co-evaluation              AM-PAC PT "6 Clicks" Mobility   Outcome Measure  Help needed turning from your back to your side while in a flat bed without using bedrails?: A Little Help needed moving from lying on your back to sitting on the side of a flat bed without using bedrails?: A Little Help needed moving to and from a bed to a chair (including a wheelchair)?: A Lot Help needed standing up from a chair using your arms (e.g., wheelchair or bedside chair)?: A Lot Help  needed to walk in hospital room?: A Lot Help needed climbing 3-5 steps with a railing? : Total 6 Click Score: 13    End of Session Equipment Utilized During Treatment: Gait belt Activity Tolerance: Patient tolerated treatment well;Patient limited by fatigue Patient left: with call bell/phone within reach;with bed alarm set;in bed Nurse Communication: Mobility status PT Visit Diagnosis: Unsteadiness on feet (R26.81);Other abnormalities of gait and mobility (R26.89);Muscle weakness (generalized) (M62.81)     Time: 6503-5465 PT Time Calculation (min) (ACUTE ONLY): 25 min  Charges:  $Therapeutic Exercise: 8-22 mins $Therapeutic Activity: 8-22 mins                     Tracey Bryant,PT Acute Rehab Services [phone] (pager)   Alvira Philips 04/07/2021, 12:28 PM

## 2021-04-07 NOTE — Progress Notes (Signed)
Occupational Therapy Treatment Patient Details Name: Smita D Mowrer MRN: [id number] DOB: May 13, 1951 Today's Date: 04/07/2021    History of present illness 70 y.o. female with presented to [facility] ED on 3/21  with nausea/vomiting/abdominal pain-further evaluation revealed acute calculus cholecystitis. Transferred to [facility] 3/24 for percutaneous gallbladder drain placement  Hospital course complicated by AKI, multifactorial anemia, and volume overload PMH: past medical history of DM-2, HTN, A. fib, chronic systolic heart failure, s/p permanent pacemaker placement   OT comments  Pt progressed well towards OT goals today. Pt remains pleasant and motivated to participate in tasks though anxious about the possibility of syncope. Ultimately, pt able to perform squat pivot to recliner at [place] today without any syncopal events during session. BP WFL, as well as HR. Pt continues to require Max A for LB ADLs due to deficits in strength, balance, and endurance though able to perform UB ADLs with setup assist sitting EOB and up in recliner today. Pt tearful and excited to be able to get out of bed successfully today. DC recs remain appropriate.    Follow Up Recommendations  SNF;Supervision/Assistance - 24 hour    Equipment Recommendations  None recommended by OT    Recommendations for Other Services      Precautions / Restrictions Precautions Precautions: Fall Precaution Comments: monitor BP, hemoglobin, B knee buckling with transfers, frequent syncopal episodes Restrictions Weight Bearing Restrictions: No       Mobility Bed Mobility Overal bed mobility: Needs Assistance Bed Mobility: Supine to Sit     Supine to sit: Supervision;HOB elevated     General bed mobility comments: increased time for slower movements, use of bed rail    Transfers Overall transfer level: Needs assistance Equipment used: 1 person hand held assist Transfers: Squat Pivot Transfers     Squat pivot transfers:  Min assist     General transfer comment: Min A for squat pivot from bed to recliner with cues for sequencing, reaching to armrest, etc.    Balance Overall balance assessment: Needs assistance Sitting-balance support: Feet supported;Bilateral upper extremity supported;No upper extremity supported;Single extremity supported Sitting balance-Leahy Scale: Fair     Standing balance support: Bilateral upper extremity supported;During functional activity Standing balance-Leahy Scale: Poor Standing balance comment: reliant on external support                           ADL either performed or assessed with clinical judgement   ADL Overall ADL's : Needs assistance/impaired     Grooming: Set up;Sitting;Brushing hair;Wash/dry face Grooming Details (indicate cue type and reason): able to wash face bed level without syncopal episode. Brushed hair in chair at end of session, no syncope but endorsed fatigue from continuous activity             Lower Body Dressing: Maximal assistance;Sitting/lateral leans;Bed level Lower Body Dressing Details (indicate cue type and reason): Max A to don TED hose and socks bed level in prep for OOB activity               General ADL Comments: Focus on slow transitional movements, rest breaks and observation of respond to activities. BP WFL, no syncopal episodes today     Vision   Vision Assessment?: Vision impaired- to be further tested in functional context Additional Comments: legally blind   Perception     Praxis      Cognition Arousal/Alertness: Awake/alert Behavior During Therapy: WFL for tasks assessed/performed;Anxious Overall Cognitive Status: Within  Functional Limits for tasks assessed                                 General Comments: anxious about repeated passing out but remains pleasant and motivated to participate        Exercises     Shoulder Instructions       General Comments 143/68 BP at start  of session. One instance of dizziness initially sitting EOB that subsided. BP 158/82 after transfer. No syncope today but noted with 2 instances of sudden sleepiness/lethargy    Pertinent Vitals/ Pain       Pain Assessment: No/denies pain  Home Living                                          Prior Functioning/Environment              Frequency  Min 2X/week        Progress Toward Goals  OT Goals(current goals can now be found in the care plan section)  Progress towards OT goals: Progressing toward goals  Acute Rehab OT Goals Patient Stated Goal: get up without passing out, get to the chair today OT Goal Formulation: With patient Time For Goal Achievement: 04/09/21 Potential to Achieve Goals: Good ADL Goals Pt Will Perform Grooming: with set-up;standing Pt Will Perform Upper Body Dressing: with set-up;sitting Pt Will Perform Lower Body Dressing: with min guard assist;sitting/lateral leans;sit to/from stand;with adaptive equipment Pt Will Transfer to Toilet: with supervision;stand pivot transfer;grab bars Pt/caregiver will Perform Home Exercise Program: Both right and left upper extremity;Increased strength;With Supervision  Plan Discharge plan remains appropriate    Co-evaluation                 AM-PAC OT "6 Clicks" Daily Activity     Outcome Measure   Help from another person eating meals?: A Little Help from another person taking care of personal grooming?: A Little Help from another person toileting, which includes using toliet, bedpan, or urinal?: A Lot Help from another person bathing (including washing, rinsing, drying)?: A Lot Help from another person to put on and taking off regular upper body clothing?: A Little Help from another person to put on and taking off regular lower body clothing?: A Lot 6 Click Score: 15    End of Session Equipment Utilized During Treatment: Gait belt  OT Visit Diagnosis: Unsteadiness on feet  (R26.81);Muscle weakness (generalized) (M62.81);Dizziness and giddiness (R42)   Activity Tolerance Patient tolerated treatment well   Patient Left in chair;with call bell/phone within reach;with chair alarm set   Nurse Communication Mobility status;Other (comment) (BP, transfer precautions)        Time: 1478-2956 OT Time Calculation (min): 41 min  Charges: OT General Charges $OT Visit: 1 Visit OT Treatments $Self Care/Home Management : 8-22 mins $Therapeutic Activity: 23-37 mins  Malachy Chamber, OTR/L Acute Rehab Services Office: [phone]   [name] 04/07/2021, 12:55 PM

## 2021-04-07 NOTE — Progress Notes (Signed)
PROGRESS NOTE        PATIENT DETAILS Name: Tracey Bryant Age: 70 y.o. Sex: female Date of Birth: 1951/12/15 Admit Date: 03/16/2021 Admitting Physician Deatra James, MD [id number], Arlyn Leak, FNP  Brief Narrative: Patient is a 70 y.o. female with past medical history of DM-2, HTN, A. fib, chronic systolic heart failure, s/p permanent pacemaker placement-presented to the ED on 3/21  with nausea/vomiting/abdominal pain-further evaluation revealed acute calculus cholecystitis.  Hospital course complicated by AKI, multifactorial anemia, volume overload, age indeterminate left DVT, multiple syncopal episodes.  See below for further details  Significant events: 3/20>> ED visit for epistaxis 3/21>> admit to [facility] for acute calculus cholecystitis 3/24>> transfer to [facility] for percutaneous gallbladder drain placement 4/2>> left leg DVT-severe anemia-after discussion risk/benefits-started on IV heparin 4/3>>syncope while working with PT 4/7-4/8>> very short duration syncopal events (few seconds) when working with PT/OT.  Telemetry negative.  PPM interrogated-no arrhythmia/pauses.  Orthostatic vital signs during these episodes done by PT were negative  Significant studies: 3/21>> A1c 9.1 3/22 >> CT abdomen/pelvis: Cholelithiasis, bilateral small pleural effusions, ascites with anasarca 3/23>> RUQ ultrasound: Acute calculus cholecystitis 3/23>> Echo: EF 25-30%, decreased RV systolic function 9/48>> mild central pulmonary vascular congestion. 4/2>> lower extremity Doppler: Age indeterminate DVT left popliteal vein. 4/8>> CT head: Multiple subacute to chronic infarcts within the right cerebral/left cerebellar hemispheres. 4/08>>  EEG  - non acute 4/11 - MRI-A - 3 cm subacute appearing cortically based infarct within the medial right occipital lobe, MRI and MRA results discussed with neurologist Dr Leonie Man & IR Dr. Estanislado Pandy, No intervention.  Antimicrobial therapy: Zosyn:  3/22>>3/31  Microbiology data: 3/22>> urine culture: E. Coli 3/22>> blood culture: No growth 3/24>> gallbladder/bile culture: No growth  Procedures : 3/24>> CT-guided cholecystostomy tube placement  Consults:  IR, general surgery, nephrology, gastroenterology, neurology, Cards called (declined by Cards)  DVT Prophylaxis :  Eliquis  Subjective:  Patient in bed, appears comfortable, denies any headache, no fever, no chest pain or pressure, no shortness of breath , no abdominal pain. No new focal weakness.   Assessment/Plan:  Severe sepsis due to acute calculus cholecystitis and UTI: Sepsis physiology has resolved-culture data as above- completed 10 days of Zosyn on 03/26/21, IR following for percutaneous cholecystostomy tube had been seen by general surgery at [facility] earlier.  Recurrent Syncope , few times lying flat in bed: none since 04/05/21 PM, seen by neurology, hypothyroid neurological and cardiac work-up including MRI/MRA, EEG, VQ, TTE, currently evaluated by neurology, Dr. Quentin Ore cardiology evaluated her pacemaker and he informed that cardiology has nothing to offer.  She is now symptom-free.  If stable discharge in 24 hours. ? Transient orthostatic hypotension, none now.   ?  Acute CVA: CT head as above-patient was off anticoagulation during the earlier part of hospital stay due to severe anemia-and epistaxis on presentation [facility]).  She has no focal neurological deficits. Seen by Stroke team, currently on Eliquis continue, supportive Rx. LDL < 70, A1c needs better outpt control.  Acute on chronic systolic/diastolic heart failure EF 25%: Volume status has improved significantly-now compensated,low dose Coreg if BP stays stable, no ACE/ARB due to CKD.  AKI on CKD stage IIIa: AKI likely hemodynamically mediated-gradually improving-creatinine downtrending but has somewhat plateaued range.  Volume status is markedly improved over the past few days.  Acute urinary  retention: Foley catheter placed  on 3/26-started Flomax on 3/30-successful voiding trial on 4/2-no longer with Foley catheter.  No longer on Flomax.  Transaminitis: Multifactorial-probably due to sepsis/acute cholecystitis-resolved.  Hyponatremia: Due to hypervolemia-resolved with diuretics.  Anemia: Multifactorial-some component of acute blood loss due to epistaxis that occurred 1 day prior to this hospitalization-on top of anemia due to chronic disease/critical illness.  Also FOBT positive-no overt GI blood loss.  Is a [name] continues to refuse blood transfusion-she confirms that she will refuse blood-even in the face of life-threatening situation.  Hemoglobin levels are slowly improving with IV iron (completed) and weekly Aranesp.  Continue to monitor CBC periodically.   Left lower leg DVT: As per discussions between previous MD and son patient is now on Eliquis.   PAF: Appears to be in sinus rhythm-Eliquis initially on hold due to severity of anemia and recent history of epistaxis-however now due to diagnosis of DVT-on therapeutic anticoagulation.  History of CVA: Nonfocal exam-difficult situation-Eliquis resumed.  HLD: On Zetia  DM-2:  Lantus and nighttime insulin adjusted on 04/06/2021.  Recent Labs    04/06/21 1441 04/06/21 2014 04/07/21 0719  GLUCAP 122* 237* 120*   Lab Results  Component Value Date   HGBA1C 9.1 (H) 03/16/2021   Lab Results  Component Value Date   CHOL 92 04/05/2021   HDL 37 (L) 04/05/2021   LDLCALC 39 04/05/2021   TRIG 82 04/05/2021   CHOLHDL 2.5 04/05/2021     Obesity:  Estimated body mass index is 28.08 kg/m as calculated from the following:   Height as of this encounter: 5\' 6"  (1.676 m).   Weight as of this encounter: 78.9 kg.   Debility/deconditioning: Due to acute illness-PT/OT recommending CIR-however probably will require SNF per notes from CIR.  Diet: Diet Order            Diet heart healthy/carb modified Room service  appropriate? Yes; Fluid consistency: Thin; Fluid restriction: 1500 mL Fluid  Diet effective now                  Code Status: Full code  Family Communication: [phone] with son on 4/8-called on 4/9-unable to leave voicemail-as mailbox not set up yet, 04/07/21  Disposition Plan: Status is: Inpatient  Remains inpatient appropriate because:Inpatient level of care appropriate due to severity of illness   Dispo: The patient is from: Home              Anticipated d/c is to: SNF              Patient currently is not medically stable to d/c.   Difficult to place patient No    Barriers to Discharge: Possible CVA on CT head-awaiting MRI BRAIN-neuro work-up.  Antimicrobial agents: Anti-infectives (From admission, onward)   Start     Dose/Rate Route Frequency Ordered Stop   03/26/21 2200  piperacillin-tazobactam (ZOSYN) IVPB 3.375 g        3.375 g 12.5 mL/hr over 240 Minutes Intravenous Every 12 hours 03/26/21 1137 03/27/21 0158   03/18/21 2200  piperacillin-tazobactam (ZOSYN) IVPB 3.375 g  Status:  Discontinued        3.375 g 12.5 mL/hr over 240 Minutes Intravenous Every 12 hours 03/18/21 1201 03/26/21 1137   03/17/21 1400  piperacillin-tazobactam (ZOSYN) IVPB 3.375 g  Status:  Discontinued        3.375 g 12.5 mL/hr over 240 Minutes Intravenous Every 8 hours 03/17/21 1246 03/18/21 1201       Time spent: 25- minutes-Greater than 50% of this  time was spent in counseling, explanation of diagnosis, planning of further management, and coordination of care.  MEDICATIONS: Scheduled Meds: . apixaban  5 mg Oral BID  . Chlorhexidine Gluconate Cloth  6 each Topical Daily  . darbepoetin (ARANESP) injection - NON-DIALYSIS  60 mcg Subcutaneous Q Sat-1800  . ezetimibe  10 mg Oral Daily  . insulin aspart  0-15 Units Subcutaneous TID WC  . insulin glargine  15 Units Subcutaneous Daily  . multivitamin with minerals  1 tablet Oral Daily  . sodium chloride flush  5 mL  Intracatheter Q8H   Continuous Infusions:  PRN Meds:.bisacodyl, dextrose, HYDROmorphone (DILAUDID) injection, ipratropium, levalbuterol, [DISCONTINUED] ondansetron **OR** ondansetron (ZOFRAN) IV, oxyCODONE, senna-docusate, traZODone   PHYSICAL EXAM: Vital signs: Vitals:   04/06/21 2342 04/07/21 0416 04/07/21 0500 04/07/21 0717  BP: (!) 147/65 (!) 147/76    Pulse: 72 74  75  Resp: 17 18  17   Temp: 98.1 F (36.7 C) 98 F (36.7 C)  98 F (36.7 C)  TempSrc: Axillary Axillary  Axillary  SpO2: 98% 94%  92%  Weight:   78.9 kg   Height:       Filed Weights   04/05/21 0546 04/06/21 0619 04/07/21 0500  Weight: 80.6 kg 78.8 kg 78.9 kg   Body mass index is 28.08 kg/m.   Gen Exam:  Awake Alert, No new F.N deficits, Normal affect Yoder.AT,PERRAL Supple Neck,No JVD, No cervical lymphadenopathy appriciated.  Symmetrical Chest wall movement, Good air movement bilaterally, CTAB RRR,No Gallops, Rubs or new Murmurs, No Parasternal Heave +ve B.Sounds, Abd Soft, No tenderness, No organomegaly appriciated, No rebound - guarding or rigidity. No Cyanosis, Clubbing or edema, No new Rash or bruise   I have personally reviewed following labs and imaging studies  LABORATORY DATA:  Recent Labs  Lab 04/01/21 0049 04/03/21 0113 04/05/21 0034 04/06/21 0829 04/07/21 0110  WBC 9.9 9.1 8.7 8.5 8.5  HGB 8.4* 8.8* 9.2* 9.9* 10.2*  HCT 27.8* 29.1* 30.4* 32.5* 33.9*  PLT 433* 450* 449* 480* 477*  MCV 98.9 99.3 98.7 97.6 99.1  MCH 29.9 30.0 29.9 29.7 29.8  MCHC 30.2 30.2 30.3 30.5 30.1  RDW 20.5* 19.4* 17.8* 17.2* 17.1*  LYMPHSABS  --   --   --  2.4 2.0  MONOABS  --   --   --  0.6 0.7  EOSABS  --   --   --  0.6* 0.5  BASOSABS  --   --   --  0.1 0.1    Recent Labs  Lab 04/03/21 0113 04/04/21 0725 04/05/21 0034 04/06/21 0829 04/07/21 0110  NA 138 138 138 139 137  K 4.5 3.9 4.0 3.6 3.9  CL 93* 97* 98 100 100  CO2 36* 31 29 30 26   GLUCOSE 164* 113* 139* 83 160*  BUN 38* 32* 33* 30* 30*   CREATININE 2.81* 2.59* 2.71* 2.45* 2.39*  CALCIUM 9.2 8.9 9.1 8.9 9.1  AST  --   --   --  19 23  ALT  --   --   --  21 20  ALKPHOS  --   --   --  48 52  BILITOT  --   --   --  0.6 0.8  ALBUMIN  --   --   --  2.3* 2.5*  MG  --   --   --  2.2 2.2  BNP  --   --   --  2,116.9* 2,160.0*    Recent Labs  Lab 04/01/21  1610 04/03/21 0113 04/05/21 0034 04/06/21 0829 04/07/21 0110  WBC 9.9 9.1 8.7 8.5 8.5  HGB 8.4* 8.8* 9.2* 9.9* 10.2*  HCT 27.8* 29.1* 30.4* 32.5* 33.9*  PLT 433* 450* 449* 480* 477*  BNP  --   --   --  2,116.9* 2,160.0*  AST  --   --   --  19 23  ALT  --   --   --  21 20  ALKPHOS  --   --   --  48 52  BILITOT  --   --   --  0.6 0.8  ALBUMIN  --   --   --  2.3* 2.5*      GFR: Estimated Creatinine Clearance: 23.5 mL/min (A) (by C-G formula based on SCr of 2.39 mg/dL (H)).  Liver Function Tests: Recent Labs  Lab 04/06/21 0829 04/07/21 0110  AST 19 23  ALT 21 20  ALKPHOS 48 52  BILITOT 0.6 0.8  PROT 6.5 6.6  ALBUMIN 2.3* 2.5*   No results for input(s): LIPASE, AMYLASE in the last 168 hours. No results for input(s): AMMONIA in the last 168 hours.  Coagulation Profile: No results for input(s): INR, PROTIME in the last 168 hours.  Cardiac Enzymes: No results for input(s): CKTOTAL, CKMB, CKMBINDEX, TROPONINI in the last 168 hours.  BNP (last 3 results) No results for input(s): PROBNP in the last 8760 hours.  Lipid Profile: Recent Labs    04/05/21 0034  CHOL 92  HDL 37*  LDLCALC 39  TRIG 82  CHOLHDL 2.5    Thyroid Function Tests: No results for input(s): TSH, T4TOTAL, FREET4, T3FREE, THYROIDAB in the last 72 hours.  Anemia Panel: No results for input(s): VITAMINB12, FOLATE, FERRITIN, TIBC, IRON, RETICCTPCT in the last 72 hours.  Urine analysis:    Component Value Date/Time   COLORURINE AMBER (A) 03/17/2021 1208   APPEARANCEUR CLOUDY (A) 03/17/2021 1208   LABSPEC 1.015 03/17/2021 1208   PHURINE 5.0 03/17/2021 1208   GLUCOSEU 50 (A)  03/17/2021 1208   HGBUR SMALL (A) 03/17/2021 1208   BILIRUBINUR NEGATIVE 03/17/2021 1208   KETONESUR NEGATIVE 03/17/2021 1208   PROTEINUR >=300 (A) 03/17/2021 1208   UROBILINOGEN 0.2 01/17/2013 1910   NITRITE NEGATIVE 03/17/2021 1208   LEUKOCYTESUR LARGE (A) 03/17/2021 1208    Sepsis Labs: Lactic Acid, Venous    Component Value Date/Time   LATICACIDVEN 1.4 03/21/2021 0805    MICROBIOLOGY: No results found for this or any previous visit (from the past 240 hour(s)).  RADIOLOGY STUDIES/RESULTS: MR ANGIO HEAD WO CONTRAST  Result Date: 04/06/2021 CLINICAL DATA:  Dizziness, nonspecific.  Stroke, follow-up. EXAM: MRI HEAD WITHOUT CONTRAST MRA HEAD WITHOUT CONTRAST TECHNIQUE: Multiplanar, multiecho pulse sequences of the brain and surrounding structures were obtained without intravenous contrast. Angiographic images of the head were obtained using MRA technique without contrast. COMPARISON:  Prior head CT examinations 04/05/2021 and earlier. FINDINGS: MRI HEAD FINDINGS Brain: Mild intermittent motion degradation. Mild cerebral and cerebellar atrophy. There is subtle gyriform restricted diffusion within the paramedian right occipital lobe measuring 3 cm in greatest dimension, most consistent with a subacute infarct (for instance as seen on series 4, image 22). Redemonstrated chronic cortically based infarcts within the right occipital pole, right parietal lobe and inferolateral right frontal lobe. There is a small amount of chronic hemosiderin deposition at site of chronic right occipital and parietal lobe infarcts. Small chronic lacunar infarcts within the basal ganglia and thalami bilaterally. Background mild multifocal T2/FLAIR hyperintensity within the cerebral white  matter which is nonspecific, but compatible with chronic small vessel ischemic disease. Small chronic infarcts within the inferior left cerebellar hemisphere. No evidence of intracranial mass. No extra-axial fluid collection. No  midline shift. Vascular: Expected proximal arterial flow voids. Skull and upper cervical spine: No focal marrow lesion. Sinuses/Orbits: Visualized orbits show no acute finding. Mild bilateral ethmoid sinus mucosal thickening. Small right maxillary sinus mucous retention cyst. Other: Left mastoid effusion. Trace fluid is also present within the right mastoid air cells. MRA HEAD FINDINGS The intracranial internal carotid arteries are patent. Atherosclerotic irregularity of both vessels. Most notably, there is moderate focal stenosis of the distal cavernous/paraclinoid left ICA. The M1 middle cerebral arteries are patent. No M2 proximal branch occlusion is identified. Atherosclerotic irregularity of the M2 and more distal middle cerebral arteries bilaterally. Most notably, there is a high-grade focal stenosis within a proximal left M2 MCA branch (series 306, image 7). The anterior cerebral arteries are patent. Moderate focal stenosis within the distal aspect of the left A1 segment. Mild-to-moderate stenoses within the bilateral A2 segments. 1-2 mm inferiorly projecting vascular protrusion arising from the paraclinoid right ICA which may reflect an aneurysm or infundibulum (series 3, images 80 and 79). 1-2 mm inferiorly projecting vascular protrusion arising from the mid M1 left middle cerebral artery which may reflect an aneurysm or infundibulum (series 3, image 87) (series 306, image 13). The non dominant intracranial right vertebral artery terminates predominantly as the right PICA (with a possible small contribution to the basilar artery. The dominant intracranial left vertebral artery is patent without stenosis. The basilar artery is patent. High-grade stenosis within the proximal left PICA (series 307, image 6). Sites of apparent high-grade stenosis within the proximal superior cerebellar arteries bilaterally. The right posterior cerebral artery is patent proximally with diffuse atherosclerotic irregularity. Most  notably, there is a high-grade focal stenosis within the P1 right PCA. The distal right posterior cerebral artery is poorly delineated and may be occluded. Markedly irregular and stenotic appearance of the P1 left posterior cerebral artery. Distal to this, the left PCA is poorly delineated, likely secondary to high-grade stenosis or occlusion. Posterior communicating arteries are hypoplastic or absent bilaterally. IMPRESSION: MRI brain: 1. Mildly motion degraded exam. 2. 3 cm subacute appearing cortically based infarct within the medial right occipital lobe, unchanged in extent from the head CT of 04/05/2021. 3. Chronic cortically based infarcts within the right parietal, right occipital and inferolateral right frontal lobes. 4. Chronic lacunar infarcts within the bilateral basal ganglia and thalami. 5. Background mild cerebral white matter chronic small vessel ischemic disease. 6. Small chronic infarcts within the inferior left cerebellar hemisphere. 7. Mild paranasal sinus disease, as described. 8. Left mastoid effusion. MRA head: 1. Advanced intracranial atherosclerotic disease, as outlined and most notably as follows. 2. 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 3. Sites of apparent severe stenosis within the proximal superior cerebellar arteries bilaterally. 4. The right posterior cerebral artery is patent proximally. However, there is a high-grade focal stenosis within the right P1 segment. The distal right posterior cerebral artery is poorly delineated and may be occluded. 5. Markedly irregular and stenotic appearance of the P1 left posterior cerebral artery. Distal to this, the left posterior cerebral artery is poorly delineated, likely secondary to high-grade stenosis or occlusion. 6. Moderate focal stenosis of the distal cavernous/paraclinoid left ICA. 7. High-grade focal stenosis within a proximal left M2 MCA vessel. 8. Moderate focal stenosis within the distal A1 segment of the left anterior  cerebral artery. 9.  1-2 mm inferiorly projecting vascular protrusion arising from the paraclinoid right ICA, which may reflect an aneurysm or infundibulum. 10. 1-2 mm inferiorly projecting vascular protrusion arising from the mid M1 left middle cerebral artery, which may reflect an aneurysm or infundibulum. Electronically Signed   By: Kellie Simmering DO   On: 04/06/2021 20:43   MR BRAIN WO CONTRAST  Result Date: 04/06/2021 CLINICAL DATA:  Dizziness, nonspecific.  Stroke, follow-up. EXAM: MRI HEAD WITHOUT CONTRAST MRA HEAD WITHOUT CONTRAST TECHNIQUE: Multiplanar, multiecho pulse sequences of the brain and surrounding structures were obtained without intravenous contrast. Angiographic images of the head were obtained using MRA technique without contrast. COMPARISON:  Prior head CT examinations 04/05/2021 and earlier. FINDINGS: MRI HEAD FINDINGS Brain: Mild intermittent motion degradation. Mild cerebral and cerebellar atrophy. There is subtle gyriform restricted diffusion within the paramedian right occipital lobe measuring 3 cm in greatest dimension, most consistent with a subacute infarct (for instance as seen on series 4, image 22). Redemonstrated chronic cortically based infarcts within the right occipital pole, right parietal lobe and inferolateral right frontal lobe. There is a small amount of chronic hemosiderin deposition at site of chronic right occipital and parietal lobe infarcts. Small chronic lacunar infarcts within the basal ganglia and thalami bilaterally. Background mild multifocal T2/FLAIR hyperintensity within the cerebral white matter which is nonspecific, but compatible with chronic small vessel ischemic disease. Small chronic infarcts within the inferior left cerebellar hemisphere. No evidence of intracranial mass. No extra-axial fluid collection. No midline shift. Vascular: Expected proximal arterial flow voids. Skull and upper cervical spine: No focal marrow lesion. Sinuses/Orbits: Visualized  orbits show no acute finding. Mild bilateral ethmoid sinus mucosal thickening. Small right maxillary sinus mucous retention cyst. Other: Left mastoid effusion. Trace fluid is also present within the right mastoid air cells. MRA HEAD FINDINGS The intracranial internal carotid arteries are patent. Atherosclerotic irregularity of both vessels. Most notably, there is moderate focal stenosis of the distal cavernous/paraclinoid left ICA. The M1 middle cerebral arteries are patent. No M2 proximal branch occlusion is identified. Atherosclerotic irregularity of the M2 and more distal middle cerebral arteries bilaterally. Most notably, there is a high-grade focal stenosis within a proximal left M2 MCA branch (series 306, image 7). The anterior cerebral arteries are patent. Moderate focal stenosis within the distal aspect of the left A1 segment. Mild-to-moderate stenoses within the bilateral A2 segments. 1-2 mm inferiorly projecting vascular protrusion arising from the paraclinoid right ICA which may reflect an aneurysm or infundibulum (series 3, images 80 and 79). 1-2 mm inferiorly projecting vascular protrusion arising from the mid M1 left middle cerebral artery which may reflect an aneurysm or infundibulum (series 3, image 87) (series 306, image 13). The non dominant intracranial right vertebral artery terminates predominantly as the right PICA (with a possible small contribution to the basilar artery. The dominant intracranial left vertebral artery is patent without stenosis. The basilar artery is patent. High-grade stenosis within the proximal left PICA (series 307, image 6). Sites of apparent high-grade stenosis within the proximal superior cerebellar arteries bilaterally. The right posterior cerebral artery is patent proximally with diffuse atherosclerotic irregularity. Most notably, there is a high-grade focal stenosis within the P1 right PCA. The distal right posterior cerebral artery is poorly delineated and may be  occluded. Markedly irregular and stenotic appearance of the P1 left posterior cerebral artery. Distal to this, the left PCA is poorly delineated, likely secondary to high-grade stenosis or occlusion. Posterior communicating arteries are hypoplastic or absent bilaterally. IMPRESSION: MRI brain: 1.  Mildly motion degraded exam. 2. 3 cm subacute appearing cortically based infarct within the medial right occipital lobe, unchanged in extent from the head CT of 04/05/2021. 3. Chronic cortically based infarcts within the right parietal, right occipital and inferolateral right frontal lobes. 4. Chronic lacunar infarcts within the bilateral basal ganglia and thalami. 5. Background mild cerebral white matter chronic small vessel ischemic disease. 6. Small chronic infarcts within the inferior left cerebellar hemisphere. 7. Mild paranasal sinus disease, as described. 8. Left mastoid effusion. MRA head: 1. Advanced intracranial atherosclerotic disease, as outlined and most notably as follows. 2. 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 3. Sites of apparent severe stenosis within the proximal superior cerebellar arteries bilaterally. 4. The right posterior cerebral artery is patent proximally. However, there is a high-grade focal stenosis within the right P1 segment. The distal right posterior cerebral artery is poorly delineated and may be occluded. 5. Markedly irregular and stenotic appearance of the P1 left posterior cerebral artery. Distal to this, the left posterior cerebral artery is poorly delineated, likely secondary to high-grade stenosis or occlusion. 6. Moderate focal stenosis of the distal cavernous/paraclinoid left ICA. 7. High-grade focal stenosis within a proximal left M2 MCA vessel. 8. Moderate focal stenosis within the distal A1 segment of the left anterior cerebral artery. 9. 1-2 mm inferiorly projecting vascular protrusion arising from the paraclinoid right ICA, which may reflect an aneurysm or  infundibulum. 10. 1-2 mm inferiorly projecting vascular protrusion arising from the mid M1 left middle cerebral artery, which may reflect an aneurysm or infundibulum. Electronically Signed   By: Kellie Simmering DO   On: 04/06/2021 20:43   NM Pulmonary Perf and Vent  Result Date: 04/06/2021 CLINICAL DATA:  Chest pain, history of DVT EXAM: NUCLEAR MEDICINE PERFUSION LUNG SCAN TECHNIQUE: Perfusion images were obtained in multiple projections after intravenous injection of radiopharmaceutical. Ventilation scans intentionally deferred if perfusion scan and chest x-ray adequate for interpretation during COVID 19 epidemic. RADIOPHARMACEUTICALS:  4.4 mCi Tc-50m MAA IV COMPARISON:  04/06/2021 FINDINGS: Planar images in multiple projections are obtained during the perfusion phase of the exam. There are no perfusion defects to suggest pulmonary embolus. IMPRESSION: 1. No evidence of pulmonary embolus. Electronically Signed   By: Randa Ngo M.D.   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 Cardiac silhouette is unremarkable. Trace bilateral pleural effusions are noted. Patchy left lower lobe consolidation could reflect airspace disease or atelectasis. Continued central vascular congestion. No pneumothorax. No acute bony abnormalities. IMPRESSION: 1. Findings favoring mild volume overload, with central vascular congestion and trace bilateral effusions. 2. Retrocardiac opacity consistent with atelectasis or airspace disease. Electronically Signed   By: Randa Ngo M.D.   On: 04/06/2021 20:09     LOS: 22 days   Lala Lund, MD  [facility]  04/07/2021, 10:31 AM

## 2021-04-07 NOTE — Plan of Care (Signed)
  Problem: Education: Goal: Knowledge of disease or condition will improve Outcome: Progressing Goal: Knowledge of patient specific risk factors addressed and post discharge goals established will improve Outcome: Progressing   Problem: Health Behavior/Discharge Planning: Goal: Ability to manage health-related needs will improve Outcome: Progressing

## 2021-04-07 NOTE — TOC Progression Note (Signed)
Transition of Care [facility]) - Progression Note    Patient Details  Name: Anjanae D Caponi MRN: [id number] Date of Birth: [birth year]  [facility] CM/SW [place], [place] Phone Number: 04/07/2021, 11:27 AM  Clinical Narrative:    CSW updated [facility] that patient could discharge tomorrow if stable. CSW also updated patient's son.   Expected Discharge Plan: [place] Barriers to Discharge: Insurance Authorization,Continued Medical Work up  Expected Discharge Plan and Services Expected Discharge Plan: [place] In-house Referral: Clinical Social Work   Post Acute Care Choice: [place] Living arrangements for the past 2 months: Single Family Home                                       Social Determinants of Health (SDOH) Interventions    Readmission Risk Interventions Readmission Risk Prevention Plan 03/19/2021 03/17/2021  Transportation Screening - Complete  PCP or Specialist Appt within 5-7 Days Complete -  Home Care Screening Complete -  Medication Review (RN CM) - Complete  Some recent data might be hidden

## 2021-04-08 DIAGNOSIS — K810 Acute cholecystitis: Secondary | ICD-10-CM | POA: Diagnosis not present

## 2021-04-08 DIAGNOSIS — N179 Acute kidney failure, unspecified: Secondary | ICD-10-CM | POA: Diagnosis not present

## 2021-04-08 LAB — GLUCOSE, CAPILLARY: Glucose-Capillary: 125 mg/dL — ABNORMAL HIGH (ref 70–99)

## 2021-04-08 LAB — SARS CORONAVIRUS 2 (TAT 6-24 HRS): SARS Coronavirus 2: NEGATIVE

## 2021-04-08 MED ORDER — CARVEDILOL 3.125 MG PO TABS: 3.1250 mg | ORAL_TABLET | Freq: Two times a day (BID) | ORAL | Status: DC

## 2021-04-08 MED ORDER — SENNOSIDES-DOCUSATE SODIUM 8.6-50 MG PO TABS: 1.0000 | ORAL_TABLET | Freq: Every evening | ORAL | Status: DC | PRN

## 2021-04-08 MED ORDER — ACETAMINOPHEN 500 MG PO TABS: 500.0000 mg | ORAL_TABLET | Freq: Three times a day (TID) | ORAL | 0 refills | Status: AC | PRN

## 2021-04-08 MED ORDER — INSULIN ASPART 100 UNIT/ML ~~LOC~~ SOLN: SUBCUTANEOUS | 0 refills | Status: DC

## 2021-04-08 MED ORDER — INSULIN GLARGINE 100 UNIT/ML ~~LOC~~ SOLN: 25.0000 [IU] | Freq: Every day | SUBCUTANEOUS | 0 refills | Status: DC

## 2021-04-08 NOTE — Discharge Summary (Signed)
Tracey Bryant TKZ:[id number] DOB: September 08, 1951 DOA: 03/16/2021  PCP: Coolidge Breeze, FNP  Admit date: 03/16/2021  Discharge date: 04/08/2021  Admitted From: Home   Disposition:  SNF   Recommendations for Outpatient Follow-up:   Follow up with PCP in 1-2 weeks  PCP Please obtain BMP/CBC, 2 view CXR in 1week,  (see Discharge instructions)   PCP Please follow up on the following pending results:    Home Health: None   Equipment/Devices: None  Consultations: Neuro, IR Discharge Condition: Stable    CODE STATUS: Full    Diet Recommendation: Heart Healthy Low Carb, strict 1.5lit/day fluid restriction  Diet Order            Diet heart healthy/carb modified Room service appropriate? Yes; Fluid consistency: Thin; Fluid restriction: 1500 mL Fluid  Diet effective now                  Chief Complaint  Patient presents with  . Emesis     Brief history of present illness from the day of admission and additional interim summary    Patient is a 70 y.o. female with past medical history of DM-2, HTN, A. fib, chronic systolic heart failure, s/p permanent pacemaker placement-presented to the ED on 3/21  with nausea/vomiting/abdominal pain-further evaluation revealed acute calculus cholecystitis.  Hospital course complicated by AKI, multifactorial anemia, volume overload, age indeterminate left DVT, multiple syncopal episodes.  See below for further details  Significant events: 3/20>> ED visit for epistaxis 3/21>> admit to [facility] for acute calculus cholecystitis 3/24>> transfer to [facility] for percutaneous gallbladder drain placement 4/2>> left leg DVT-severe anemia-after discussion risk/benefits-started on IV heparin 4/3>>syncope while working with PT 4/7-4/8>> very short duration syncopal events (few seconds) when working  with PT/OT.  Telemetry negative.  PPM interrogated-no arrhythmia/pauses.  Orthostatic vital signs during these episodes done by PT were negative  Significant studies: 3/21>> A1c 9.1 3/22 >> CT abdomen/pelvis: Cholelithiasis, bilateral small pleural effusions, ascites with anasarca 3/23>> RUQ ultrasound: Acute calculus cholecystitis 3/23>> Echo: EF 25-30%, decreased RV systolic function 5/73>> mild central pulmonary vascular congestion. 4/2>> lower extremity Doppler: Age indeterminate DVT left popliteal vein. 4/8>> CT head: Multiple subacute to chronic infarcts within the right cerebral/left cerebellar hemispheres. 4/08>>  EEG  - non acute 4/11 - MRI-A - 3 cm subacute appearing cortically based infarct within the medial right occipital lobe, MRI and MRA results discussed with neurologist Dr Leonie Man & IR Dr. Estanislado Pandy, No intervention.  Antimicrobial therapy: Zosyn: 3/22>>3/31  Microbiology data: 3/22>> urine culture: E. Coli 3/22>> blood culture: No growth 3/24>> gallbladder/bile culture: No growth                                                                 Hospital Course    Severe sepsis due to acute calculus  cholecystitis and UTI: Sepsis physiology has resolved-culture data as above- completed 10 days of Zosyn on 03/26/21, IR following for percutaneous cholecystostomy tube had been seen by general surgery at [facility] earlier.  Continue drain site care at SNF, needs to follow-up with IR scheduled appointment as below.  Monitor daily drain output.  Empty drain daily.  Recurrent Syncope , few times lying flat in bed: none since 04/05/21 PM, seen by neurology, hypothyroid neurological and cardiac work-up including MRI/MRA, EEG, VQ, TTE, currently evaluated by neurology, Dr. Quentin Ore cardiology evaluated her pacemaker and he informed that cardiology has nothing to offer.  She is now symptom-free. ? Transient orthostatic hypotension, none now.   ?  Acute CVA: CT head as  above-patient was off anticoagulation during the earlier part of hospital stay due to severe anemia-and epistaxis on presentation [facility]).  She has no focal neurological deficits. Seen by Stroke team, currently on Eliquis continue, supportive Rx. LDL < 70, A1c needs better outpt control.  Acute on chronic systolic/diastolic heart failure EF 25%: Volume status has improved significantly-now compensated,low dose Coreg continued as blood pressure much improved, no ACE/ARB due to CKD.  AKI on CKD stage IIIa: AKI likely hemodynamically mediated-gradually improving-creatinine downtrending but has somewhat plateaued range.  Volume status is markedly improved over the past few days, renal function is considerably improved, repeat CBC and CMP in 7 to 10 days at SNF, now on strict 1.5 L total fluid restriction.  Acute urinary retention: Foley catheter placed on 3/26-started Flomax on 3/30-successful voiding trial on 4/2-no longer with Foley catheter.  No longer on Flomax.  Transaminitis: Multifactorial-probably due to sepsis/acute cholecystitis-resolved.  Hyponatremia: Due to hypervolemia-resolved with diuretics.  Anemia: Multifactorial-some component of acute blood loss due to epistaxis that occurred 1 day prior to this hospitalization-on top of anemia due to chronic disease/critical illness.  Also FOBT positive-no overt GI blood loss.  Is a [name] continues to refuse blood transfusion-she confirms that she will refuse blood-even in the face of life-threatening situation.  Hemoglobin levels are slowly improving with IV iron (completed) and weekly Aranesp was used in the hospital.  Continue to monitor CBC periodically at SNF.   Left lower leg DVT: As per discussions between previous MD and son patient is now on Eliquis.  VQ scan unremarkable  PAF: Appears to be in sinus rhythm-Eliquis initially on hold due to severity of anemia and recent history of epistaxis-however now due  to diagnosis of DVT-on therapeutic anticoagulation.  History of CVA: Nonfocal exam-difficult situation-Eliquis resumed.  Case discussed with neurologist Dr. Leonie Man multiple times.  HLD: On Zetia and stable with LDL less than 70.  DM-2:   Poor outpatient control due to hyperglycemia A1c was 9, placed on Lantus and sliding scale, Accu-Cheks before every meal at bedtime at SNF monitor and adjust.   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[place])   Acute posthemorrhagic anemia   Epistaxis   Chronic anticoagulation   CVA (cerebral vascular accident) ([place])   Cholelithiasis without cholangitis   Uncontrolled type 2 diabetes mellitus with hyperglycemia, with long-term current use of insulin (HCC)   Severe sepsis ([place])   Acute cholecystitis   Acute lower UTI   Posterior cerebral artery embolism, right    Discharge instructions    Discharge Instructions  Discharge instructions   Complete by: As directed    Follow with Primary MD Coolidge Breeze, FNP in 7 days   Get CBC, CMP, 2 view Chest X ray -  checked next visit within 1 week by Primary MD or SNF MD   Activity: As tolerated with Full fall precautions use walker/cane & assistance as needed  Disposition SNF  Diet: Heart Healthy Low Carb. Accuchecks QAC-HS. 1.5 L/day strict fluid restriction.  Special Instructions: If you have smoked or chewed Tobacco  in the last 2 yrs please stop smoking, stop any regular Alcohol  and or any Recreational drug use.  On your next visit with your primary care physician please Get Medicines reviewed and adjusted.  Please request your Prim.MD to go over all Hospital Tests and Procedure/Radiological results at the follow up, please get all Hospital records sent to your Prim MD by signing  hospital release before you go home.  If you experience worsening of your admission symptoms, develop shortness of breath, life threatening emergency, suicidal or homicidal thoughts you must seek medical attention immediately by calling 911 or calling your MD immediately  if symptoms less severe.  You Must read complete instructions/literature along with all the possible adverse reactions/side effects for all the Medicines you take and that have been prescribed to you. Take any new Medicines after you have completely understood and accpet all the possible adverse reactions/side effects.   Increase activity slowly   Complete by: As directed    No wound care   Complete by: As directed    Keep gallbladder drain site area clean and dry, apply dry dressing over to retain the drain in place, empty drain daily, monitor daily output.      Discharge Medications   Allergies as of 04/08/2021   No Known Allergies     Medication List    STOP taking these medications   glipiZIDE 5 MG 24 hr tablet Commonly known as: GLUCOTROL XL   insulin NPH-regular Human (70-30) 100 UNIT/ML injection     TAKE these medications   acetaminophen 500 MG tablet Commonly known as: TYLENOL Take 1 tablet (500 mg total) by mouth every 8 (eight) hours as needed for moderate pain.   carvedilol 3.125 MG tablet Commonly known as: COREG Take 1 tablet (3.125 mg total) by mouth 2 (two) times daily with a meal.   Eliquis 5 MG Tabs tablet Generic drug: apixaban Take 5 mg by mouth 2 (two) times daily.   ezetimibe 10 MG tablet Commonly known as: ZETIA Take 10 mg by mouth daily.   furosemide 20 MG tablet Commonly known as: LASIX Take 20 mg by mouth.   insulin aspart 100 UNIT/ML injection Commonly known as: NovoLOG Substitute to any brand approved.Before each meal 3 times a day, 140-199 - 2 units, 200-250 - 4 units, 251-299 - 6 units,  300-349 - 8 units,  350 or above 10 units. Dispense syringes and needles as needed,  Ok to switch to PEN if approved. DX DM2, Code E11.65   insulin glargine 100 UNIT/ML injection Commonly known as: LANTUS Inject 0.25 mLs (25 Units total) into the skin at bedtime. What changed: how much to take   senna-docusate 8.6-50 MG tablet Commonly known as: Senokot-S Take 1 tablet by mouth at bedtime as needed for mild constipation.        Contact information for follow-up providers    Suttle, Rosanne Ashing, MD Follow up in 8 week(s).   Specialties: Interventional Radiology, Diagnostic Radiology, Radiology  Why: follow up with Dr Serafina Royals in IR 8 weeks-- pt will hear from IR scheduler for appt time and date; call [phone] if any questions Contact information: [address] [place] [address] [phone]        Coolidge Breeze, FNP. Schedule an appointment as soon as possible for a visit in 1 week(s).   Specialty: Family Medicine Contact information: [address] [place] [id number] [phone]            Contact information for after-discharge care    [facility] .   Service: Skilled Nursing Contact information: [place] [phone]                  Major procedures and Radiology Reports - PLEASE review detailed and final reports thoroughly  -      CT ABDOMEN PELVIS WO CONTRAST  Result Date: 03/17/2021 CLINICAL DATA:  Nonlocalized abdominal pain. Increased lactic acid. Lethargy. Confusion. EXAM: CT ABDOMEN AND PELVIS WITHOUT CONTRAST TECHNIQUE: Multidetector CT imaging of the abdomen and pelvis was performed following the standard protocol without IV contrast. COMPARISON:  01/17/2013 FINDINGS: Lower chest: Left lower lobe 9 mm pulmonary nodule on 25/4 is similar in 2014 and considered benign. Bibasilar subsegmental atelectasis. Mild cardiomegaly with pacer. Distal right coronary artery calcification. Small bilateral pleural effusions. Hepatobiliary: Small volume  perihepatic ascites. Subtle hypoattenuation involving the anterior portion of segment 4 B, including on 28/2. On the order of 5.0 cm. 2.0 cm gallstone with gallbladder wall thickening up to 9 mm. No biliary duct dilatation. Pancreas: Fatty replacement involving the pancreatic head and uncinate process. Spleen: Normal in size, without focal abnormality. Adrenals/Urinary Tract: Normal adrenal glands. Mild renal cortical thinning bilaterally. Interpolar right renal exophytic 7.4 cm fluid density lesion with calcification in its wall. Trace air within the nondependent bladder including on 79/2. No ureteric or bladder calculi. Stomach/Bowel: Proximal gastric underdistention. Colonic stool burden suggests constipation. Normal terminal ileum and appendix. Normal small bowel. Vascular/Lymphatic: Advanced aortic and branch vessel atherosclerosis. Retroaortic left renal vein. No abdominopelvic adenopathy. Reproductive: Normal uterus and adnexa. Other: Small volume abdominopelvic fluid including adjacent the liver and spleen. Musculoskeletal: Mild anasarca. Remote eighth posterior left rib fracture. IMPRESSION: 1. Cholelithiasis with gallbladder wall thickening. Correlate with right upper quadrant symptoms and possibly ultrasound to exclude acute cholecystitis. 2.  Possible constipation. 3. Bilateral small pleural effusions and abdominopelvic ascites with anasarca. Question fluid overload. 4. Coronary artery atherosclerosis. Aortic Atherosclerosis (ICD10-I70.0). 5. Segment 4 B hypoattenuation could represent prominent focal steatosis but is indeterminate. Consider nonemergent, outpatient pre and post contrast abdominal MRI. 6. Trace air within the bladder.  Correlate with instrumentation. Electronically Signed   By: Abigail Miyamoto M.D.   On: 03/17/2021 16:38   CT HEAD WO CONTRAST  Result Date: 04/05/2021 CLINICAL DATA:  Follow-up infarct EXAM: CT HEAD WITHOUT CONTRAST TECHNIQUE: Contiguous axial images were obtained from the  base of the skull through the vertex without intravenous contrast. COMPARISON:  Head CT from 2 days ago FINDINGS: Brain: Recent appearing parasagittal right occipital parietal cortically based infarct. Remote right parietal, right inferior frontal, and right occipital pole infarcts. No acute hemorrhage, hydrocephalus, or collection. Small remote bilateral cerebellar infarcts better depicted on prior. Vascular: Atheromatous calcification Skull: Normal. Negative for fracture or focal lesion. Sinuses/Orbits: No acute finding IMPRESSION: 1. Recent right occipital parietal infarct without hemorrhage or progression from 2 days ago. 2. Chronic  right cerebral and cerebellar infarcts. Electronically Signed   By: Monte Fantasia M.D.   On: 04/05/2021 07:07   CT HEAD WO CONTRAST  Result Date: 04/03/2021 CLINICAL DATA:  Dizziness EXAM: CT HEAD WITHOUT CONTRAST TECHNIQUE: Contiguous axial images were obtained from the base of the skull through the vertex without intravenous contrast. COMPARISON:  None. FINDINGS: Brain: There is encephalomalacia in the right inferior frontal, occipital and parietal lobes. There is an age indeterminate but likely chronic infarct left cerebellum. There is periventricular hypoattenuation compatible with chronic microvascular disease. No hemorrhage or extra-axial collection. No midline shift, other mass effect or hydrocephalus. Vascular: Atherosclerotic calcification of the vertebral and internal carotid arteries at the skull base. No abnormal hyperdensity of the major intracranial arteries or dural venous sinuses. Skull: The visualized skull base, calvarium and extracranial soft tissues are normal. Sinuses/Orbits: No fluid levels or advanced mucosal thickening of the visualized paranasal sinuses. No mastoid or middle ear effusion. The orbits are normal. IMPRESSION: 1. No intracranial hemorrhage or mass effect. 2. Multiple subacute to chronic infarcts within the right cerebral and left cerebellar  hemispheres. MRI recommended for better temporal characterization. 3. Chronic microvascular ischemia. Electronically Signed   By: Ulyses Jarred M.D.   On: 04/03/2021 19:03   MR ANGIO HEAD WO CONTRAST  Result Date: 04/06/2021 CLINICAL DATA:  Dizziness, nonspecific.  Stroke, follow-up. EXAM: MRI HEAD WITHOUT CONTRAST MRA HEAD WITHOUT CONTRAST TECHNIQUE: Multiplanar, multiecho pulse sequences of the brain and surrounding structures were obtained without intravenous contrast. Angiographic images of the head were obtained using MRA technique without contrast. COMPARISON:  Prior head CT examinations 04/05/2021 and earlier. FINDINGS: MRI HEAD FINDINGS Brain: Mild intermittent motion degradation. Mild cerebral and cerebellar atrophy. There is subtle gyriform restricted diffusion within the paramedian right occipital lobe measuring 3 cm in greatest dimension, most consistent with a subacute infarct (for instance as seen on series 4, image 22). Redemonstrated chronic cortically based infarcts within the right occipital pole, right parietal lobe and inferolateral right frontal lobe. There is a small amount of chronic hemosiderin deposition at site of chronic right occipital and parietal lobe infarcts. Small chronic lacunar infarcts within the basal ganglia and thalami bilaterally. Background mild multifocal T2/FLAIR hyperintensity within the cerebral white matter which is nonspecific, but compatible with chronic small vessel ischemic disease. Small chronic infarcts within the inferior left cerebellar hemisphere. No evidence of intracranial mass. No extra-axial fluid collection. No midline shift. Vascular: Expected proximal arterial flow voids. Skull and upper cervical spine: No focal marrow lesion. Sinuses/Orbits: Visualized orbits show no acute finding. Mild bilateral ethmoid sinus mucosal thickening. Small right maxillary sinus mucous retention cyst. Other: Left mastoid effusion. Trace fluid is also present within the  right mastoid air cells. MRA HEAD FINDINGS The intracranial internal carotid arteries are patent. Atherosclerotic irregularity of both vessels. Most notably, there is moderate focal stenosis of the distal cavernous/paraclinoid left ICA. The M1 middle cerebral arteries are patent. No M2 proximal branch occlusion is identified. Atherosclerotic irregularity of the M2 and more distal middle cerebral arteries bilaterally. Most notably, there is a high-grade focal stenosis within a proximal left M2 MCA branch (series 306, image 7). The anterior cerebral arteries are patent. Moderate focal stenosis within the distal aspect of the left A1 segment. Mild-to-moderate stenoses within the bilateral A2 segments. 1-2 mm inferiorly projecting vascular protrusion arising from the paraclinoid right ICA which may reflect an aneurysm or infundibulum (series 3, images 80 and 79). 1-2 mm inferiorly projecting vascular protrusion arising from the mid  M1 left middle cerebral artery which may reflect an aneurysm or infundibulum (series 3, image 87) (series 306, image 13). The non dominant intracranial right vertebral artery terminates predominantly as the right PICA (with a possible small contribution to the basilar artery. The dominant intracranial left vertebral artery is patent without stenosis. The basilar artery is patent. High-grade stenosis within the proximal left PICA (series 307, image 6). Sites of apparent high-grade stenosis within the proximal superior cerebellar arteries bilaterally. The right posterior cerebral artery is patent proximally with diffuse atherosclerotic irregularity. Most notably, there is a high-grade focal stenosis within the P1 right PCA. The distal right posterior cerebral artery is poorly delineated and may be occluded. Markedly irregular and stenotic appearance of the P1 left posterior cerebral artery. Distal to this, the left PCA is poorly delineated, likely secondary to high-grade stenosis or occlusion.  Posterior communicating arteries are hypoplastic or absent bilaterally. IMPRESSION: MRI brain: 1. Mildly motion degraded exam. 2. 3 cm subacute appearing cortically based infarct within the medial right occipital lobe, unchanged in extent from the head CT of 04/05/2021. 3. Chronic cortically based infarcts within the right parietal, right occipital and inferolateral right frontal lobes. 4. Chronic lacunar infarcts within the bilateral basal ganglia and thalami. 5. Background mild cerebral white matter chronic small vessel ischemic disease. 6. Small chronic infarcts within the inferior left cerebellar hemisphere. 7. Mild paranasal sinus disease, as described. 8. Left mastoid effusion. MRA head: 1. Advanced intracranial atherosclerotic disease, as outlined and most notably as follows. 2. 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 3. Sites of apparent severe stenosis within the proximal superior cerebellar arteries bilaterally. 4. The right posterior cerebral artery is patent proximally. However, there is a high-grade focal stenosis within the right P1 segment. The distal right posterior cerebral artery is poorly delineated and may be occluded. 5. Markedly irregular and stenotic appearance of the P1 left posterior cerebral artery. Distal to this, the left posterior cerebral artery is poorly delineated, likely secondary to high-grade stenosis or occlusion. 6. Moderate focal stenosis of the distal cavernous/paraclinoid left ICA. 7. High-grade focal stenosis within a proximal left M2 MCA vessel. 8. Moderate focal stenosis within the distal A1 segment of the left anterior cerebral artery. 9. 1-2 mm inferiorly projecting vascular protrusion arising from the paraclinoid right ICA, which may reflect an aneurysm or infundibulum. 10. 1-2 mm inferiorly projecting vascular protrusion arising from the mid M1 left middle cerebral artery, which may reflect an aneurysm or infundibulum. Electronically Signed   By: Kellie Simmering  DO   On: 04/06/2021 20:43   MR BRAIN WO CONTRAST  Result Date: 04/06/2021 CLINICAL DATA:  Dizziness, nonspecific.  Stroke, follow-up. EXAM: MRI HEAD WITHOUT CONTRAST MRA HEAD WITHOUT CONTRAST TECHNIQUE: Multiplanar, multiecho pulse sequences of the brain and surrounding structures were obtained without intravenous contrast. Angiographic images of the head were obtained using MRA technique without contrast. COMPARISON:  Prior head CT examinations 04/05/2021 and earlier. FINDINGS: MRI HEAD FINDINGS Brain: Mild intermittent motion degradation. Mild cerebral and cerebellar atrophy. There is subtle gyriform restricted diffusion within the paramedian right occipital lobe measuring 3 cm in greatest dimension, most consistent with a subacute infarct (for instance as seen on series 4, image 22). Redemonstrated chronic cortically based infarcts within the right occipital pole, right parietal lobe and inferolateral right frontal lobe. There is a small amount of chronic hemosiderin deposition at site of chronic right occipital and parietal lobe infarcts. Small chronic lacunar infarcts within the basal ganglia and thalami bilaterally. Background mild  multifocal T2/FLAIR hyperintensity within the cerebral white matter which is nonspecific, but compatible with chronic small vessel ischemic disease. Small chronic infarcts within the inferior left cerebellar hemisphere. No evidence of intracranial mass. No extra-axial fluid collection. No midline shift. Vascular: Expected proximal arterial flow voids. Skull and upper cervical spine: No focal marrow lesion. Sinuses/Orbits: Visualized orbits show no acute finding. Mild bilateral ethmoid sinus mucosal thickening. Small right maxillary sinus mucous retention cyst. Other: Left mastoid effusion. Trace fluid is also present within the right mastoid air cells. MRA HEAD FINDINGS The intracranial internal carotid arteries are patent. Atherosclerotic irregularity of both vessels. Most  notably, there is moderate focal stenosis of the distal cavernous/paraclinoid left ICA. The M1 middle cerebral arteries are patent. No M2 proximal branch occlusion is identified. Atherosclerotic irregularity of the M2 and more distal middle cerebral arteries bilaterally. Most notably, there is a high-grade focal stenosis within a proximal left M2 MCA branch (series 306, image 7). The anterior cerebral arteries are patent. Moderate focal stenosis within the distal aspect of the left A1 segment. Mild-to-moderate stenoses within the bilateral A2 segments. 1-2 mm inferiorly projecting vascular protrusion arising from the paraclinoid right ICA which may reflect an aneurysm or infundibulum (series 3, images 80 and 79). 1-2 mm inferiorly projecting vascular protrusion arising from the mid M1 left middle cerebral artery which may reflect an aneurysm or infundibulum (series 3, image 87) (series 306, image 13). The non dominant intracranial right vertebral artery terminates predominantly as the right PICA (with a possible small contribution to the basilar artery. The dominant intracranial left vertebral artery is patent without stenosis. The basilar artery is patent. High-grade stenosis within the proximal left PICA (series 307, image 6). Sites of apparent high-grade stenosis within the proximal superior cerebellar arteries bilaterally. The right posterior cerebral artery is patent proximally with diffuse atherosclerotic irregularity. Most notably, there is a high-grade focal stenosis within the P1 right PCA. The distal right posterior cerebral artery is poorly delineated and may be occluded. Markedly irregular and stenotic appearance of the P1 left posterior cerebral artery. Distal to this, the left PCA is poorly delineated, likely secondary to high-grade stenosis or occlusion. Posterior communicating arteries are hypoplastic or absent bilaterally. IMPRESSION: MRI brain: 1. Mildly motion degraded exam. 2. 3 cm subacute  appearing cortically based infarct within the medial right occipital lobe, unchanged in extent from the head CT of 04/05/2021. 3. Chronic cortically based infarcts within the right parietal, right occipital and inferolateral right frontal lobes. 4. Chronic lacunar infarcts within the bilateral basal ganglia and thalami. 5. Background mild cerebral white matter chronic small vessel ischemic disease. 6. Small chronic infarcts within the inferior left cerebellar hemisphere. 7. Mild paranasal sinus disease, as described. 8. Left mastoid effusion. MRA head: 1. Advanced intracranial atherosclerotic disease, as outlined and most notably as follows. 2. 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 3. Sites of apparent severe stenosis within the proximal superior cerebellar arteries bilaterally. 4. The right posterior cerebral artery is patent proximally. However, there is a high-grade focal stenosis within the right P1 segment. The distal right posterior cerebral artery is poorly delineated and may be occluded. 5. Markedly irregular and stenotic appearance of the P1 left posterior cerebral artery. Distal to this, the left posterior cerebral artery is poorly delineated, likely secondary to high-grade stenosis or occlusion. 6. Moderate focal stenosis of the distal cavernous/paraclinoid left ICA. 7. High-grade focal stenosis within a proximal left M2 MCA vessel. 8. Moderate focal stenosis within the distal A1 segment of  the left anterior cerebral artery. 9. 1-2 mm inferiorly projecting vascular protrusion arising from the paraclinoid right ICA, which may reflect an aneurysm or infundibulum. 10. 1-2 mm inferiorly projecting vascular protrusion arising from the mid M1 left middle cerebral artery, which may reflect an aneurysm or infundibulum. Electronically Signed   By: Kellie Simmering DO   On: 04/06/2021 20:43   US Abdomen Complete  Result Date: 03/18/2021 CLINICAL DATA:  Cholelithiasis, acute renal failure EXAM: ABDOMEN  ULTRASOUND COMPLETE COMPARISON:  CT 03/17/2021 FINDINGS: Gallbladder: The gallbladder is distended. Several shadowing gallstones are seen impacted within the gallbladder neck and there is layering sludge within the gallbladder. The gallbladder wall is markedly thickened measuring up to 14 mm in diameter and mild pericholecystic fluid is identified. The sonographic Percell Miller sign is reportedly positive. Common bile duct: Diameter: 4-5 mm in proximal diameter. Liver: No focal lesion identified. Within normal limits in parenchymal echogenicity. Portal vein is patent on color Doppler imaging with normal direction of blood flow towards the liver. IVC: No abnormality visualized. Pancreas: Visualized portion unremarkable. Spleen: Size and appearance within normal limits. Right Kidney: Length: 10.9 cm. Echogenicity within normal limits. No mass or hydronephrosis visualized. 8.1 cm simple cortical cyst identified within the upper pole. Left Kidney: Length: 11.0 cm. Echogenicity within normal limits. No mass or hydronephrosis visualized. Abdominal aorta: No aneurysm visualized, though the distal aorta is obscured by overlying bowel gas. Other findings: Small right pleural effusion is present. Mild perihepatic and perisplenic ascites is noted. IMPRESSION: Acute calculus cholecystitis. Mild ascites.  Small right pleural effusion. 8.1 cm right upper pole simple cortical cyst. This is best characterized as a Bosniak class 1 cyst and further follow-up is not required. Electronically Signed   By: Fidela Salisbury MD   On: 03/18/2021 12:51   NM Pulmonary Perf and Vent  Result Date: 04/06/2021 CLINICAL DATA:  Chest pain, history of DVT EXAM: NUCLEAR MEDICINE PERFUSION LUNG SCAN TECHNIQUE: Perfusion images were obtained in multiple projections after intravenous injection of radiopharmaceutical. Ventilation scans intentionally deferred if perfusion scan and chest x-ray adequate for interpretation during COVID 19 epidemic.  RADIOPHARMACEUTICALS:  4.4 mCi Tc-83m MAA IV COMPARISON:  04/06/2021 FINDINGS: Planar images in multiple projections are obtained during the perfusion phase of the exam. There are no perfusion defects to suggest pulmonary embolus. IMPRESSION: 1. No evidence of pulmonary embolus. Electronically Signed   By: Randa Ngo M.D.   On: 04/06/2021 20:48   US Abdomen Limited  Result Date: 03/18/2021 CLINICAL DATA:  Right upper quadrant abdominal pain EXAM: ULTRASOUND ABDOMEN LIMITED RIGHT UPPER QUADRANT COMPARISON:  CT 03/17/2021 FINDINGS: Gallbladder: The gallbladder is distended, there is marked gallbladder wall thickening, and mild pericholecystic fluid identified. The gallbladder contains layering sludge and stones. Stones are seen within the gallbladder neck, however, decubitus positioning was not performed to assess for impaction. The sonographic Percell Miller sign is reportedly positive. Common bile duct: Diameter: 4 mm in proximal diameter Liver: No focal lesion identified. Within normal limits in parenchymal echogenicity. Portal vein is patent on color Doppler imaging with normal direction of blood flow towards the liver. Other: Mild perihepatic ascites is present. Small right pleural effusion is noted. 8.9 cm simple cortical cyst is noted within the upper pole of the right kidney. IMPRESSION: Acute calculus cholecystitis. Mild perihepatic ascites and small right pleural effusion. Electronically Signed   By: Fidela Salisbury MD   On: 03/18/2021 12:46   DG CHEST PORT 1 VIEW  Result Date: 04/06/2021 CLINICAL DATA:  Nonspecific chest pain,  history of DVT EXAM: PORTABLE CHEST 1 VIEW COMPARISON:  03/27/2021 FINDINGS: Single frontal view of the chest demonstrates stable dual lead pacer overlying left chest. Cardiac silhouette is unremarkable. Trace bilateral pleural effusions are noted. Patchy left lower lobe consolidation could reflect airspace disease or atelectasis. Continued central vascular congestion. No  pneumothorax. No acute bony abnormalities. IMPRESSION: 1. Findings favoring mild volume overload, with central vascular congestion and trace bilateral effusions. 2. Retrocardiac opacity consistent with atelectasis or airspace disease. Electronically Signed   By: Randa Ngo M.D.   On: 04/06/2021 20:09   DG Chest Port 1 View  Result Date: 03/27/2021 CLINICAL DATA:  Shortness of breath EXAM: PORTABLE CHEST 1 VIEW COMPARISON:  03/20/2021 FINDINGS: Left pacer remains in place, unchanged. Cardiomegaly. Small bilateral pleural effusions with bibasilar atelectasis. No overt edema. No acute bony abnormality. IMPRESSION: Small effusions with bibasilar atelectasis. Electronically Signed   By: Rolm Baptise M.D.   On: 03/27/2021 19:05   DG Chest Port 1 View  Result Date: 03/20/2021 CLINICAL DATA:  Hypoxia. EXAM: PORTABLE CHEST 1 VIEW COMPARISON:  January 17, 2013. FINDINGS: Stable cardiomediastinal silhouette. Mild central pulmonary vascular congestion is noted. No pneumothorax or pleural effusion is noted. No consolidative process is noted. 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 Bony thorax is unremarkable. IMPRESSION: Mild central pulmonary vascular congestion. Electronically Signed   By: Marijo Conception M.D.   On: 03/20/2021 08:55   EEG adult  Result Date: 04/03/2021 Plancher, Baron Sane, MD     04/06/2021  3:13 PM PROCEDURE: EEG routine 25 minutes with video study DATES OF TEST: 04/03/2021 REASON FOR TEST: Syncope AED/Sedative MEDICATIONS: n/a TECHNIQUE: This is an 18 channel digital EEG recording using the standard international 10/20 system of electrode placement with one channel EKG recording. Pt was awake and drowsy during this recording. FINDINGS: Background rhythm:symmetric diffuse 5-7 Hz Amplitude : normal Continuity: continuous Breach effect:  NO Variability: YES Reactivity: YES Rhythmic delta activity: NO Periodic discharges: NO Sporadic epileptiform discharges: NO Electrographic/electroclinical  seizures: NO Limited EKG reveals no abnormalities. IMPRESSION: This is an abnormal study due to - 1. Mild Generalized slowing is a non-specific finding consistent with a generalized disturbance of cerebral functioning including toxic, metabolic, or structural abnormalities that are multi-focal or diffuse. 2. No definite epileptiform discharges or electrographic seizures noted.   ECHOCARDIOGRAM COMPLETE  Result Date: 03/18/2021    ECHOCARDIOGRAM REPORT   Patient Name:   KIYONA MCNALL Date of Exam: 03/18/2021 Medical Rec #:  [id number]       Height:       66.0 in Accession #:    [id number]      Weight:       180.8 lb Date of Birth:  [birth year]       BSA:          1.916 m Patient Age:    70 years        BP:           126/51 mmHg Patient Gender: F               HR:           71 bpm. Exam Location:  [facility] Procedure: 2D Echo and Intracardiac Opacification Agent Indications:    CHF-Acute Systolic F09.32                 SBE I33.9  History:        Patient has no prior history of Echocardiogram examinations.  Stroke; Risk Factors:Non-Smoker, Diabetes and Hypertension.                 Rheumatoid Arthritis.  Sonographer:    Leavy Cella RDCS (AE) Referring Phys: [phone] SEYED A SHAHMEHDI IMPRESSIONS  1. Left ventricular ejection fraction, by estimation, is 25 to 30%. The left ventricle has normal function. The left ventricle demonstrates global hypokinesis. There is mild left ventricular hypertrophy. Left ventricular diastolic parameters are consistent with Grade II diastolic dysfunction (pseudonormalization). Elevated left atrial pressure.  2. RV poorly visualized, grossly appears enlarged with decreased systolic function. . Right ventricular systolic function was not well visualized. The right ventricular size is not well visualized.  3. Left atrial size was severely dilated.  4. The mitral valve is normal in structure. No evidence of mitral valve regurgitation. No evidence of mitral stenosis.  5.  The aortic valve is tricuspid. There is mild calcification of the aortic valve. There is mild thickening of the aortic valve. Aortic valve regurgitation is not visualized. No aortic stenosis is present.  6. Mild pulmonary HTN, PASP is 36 mmHg.  7. The inferior vena cava is normal in size with greater than 50% respiratory variability, suggesting right atrial pressure of 3 mmHg. FINDINGS  Left Ventricle: Left ventricular ejection fraction, by estimation, is 25 to 30%. The left ventricle has normal function. The left ventricle demonstrates global hypokinesis. The left ventricular internal cavity size was normal in size. There is mild left  ventricular hypertrophy. Left ventricular diastolic parameters are consistent with Grade II diastolic dysfunction (pseudonormalization). Elevated left atrial pressure. Right Ventricle: RV poorly visualized, grossly appears enlarged with decreased systolic function. The right ventricular size is not well visualized. Right vetricular wall thickness was not well visualized. Right ventricular systolic function was not well  visualized. Left Atrium: Left atrial size was severely dilated. Right Atrium: Right atrial size was not well visualized. Pericardium: There is no evidence of pericardial effusion. Mitral Valve: The mitral valve is normal in structure. There is mild thickening of the mitral valve leaflet(s). There is mild calcification of the mitral valve leaflet(s). Mild mitral annular calcification. No evidence of mitral valve regurgitation. No evidence of mitral valve stenosis. Tricuspid Valve: The tricuspid valve is normal in structure. Tricuspid valve regurgitation is mild . No evidence of tricuspid stenosis. Aortic Valve: The aortic valve is tricuspid. There is mild calcification of the aortic valve. There is mild thickening of the aortic valve. There is mild aortic valve annular calcification. Aortic valve regurgitation is not visualized. No aortic stenosis  is present. Aortic  valve mean gradient measures 2.3 mmHg. Aortic valve peak gradient measures 4.6 mmHg. Aortic valve area, by VTI measures 1.70 cm. Pulmonic Valve: The pulmonic valve was not well visualized. Pulmonic valve regurgitation is not visualized. No evidence of pulmonic stenosis. Aorta: The aortic root is normal in size and structure. Pulmonary Artery: Mild pulmonary HTN, PASP is 36 mmHg. Venous: The inferior vena cava is normal in size with greater than 50% respiratory variability, suggesting right atrial pressure of 3 mmHg. IAS/Shunts: No atrial level shunt detected by color flow Doppler.  LEFT VENTRICLE PLAX 2D LVIDd:         4.08 cm  Diastology LVIDs:         3.43 cm  LV e' medial:    4.79 cm/s LV PW:         1.34 cm  LV E/e' medial:  24.4 LV IVS:        1.08 cm  LV e'  lateral:   7.83 cm/s LVOT diam:     1.80 cm  LV E/e' lateral: 14.9 LV SV:         33 LV SV Index:   17 LVOT Area:     2.54 cm  RIGHT VENTRICLE RV S prime:     9.90 cm/s TAPSE (M-mode): 1.2 cm LEFT ATRIUM             Index       RIGHT ATRIUM           Index LA diam:        4.60 cm 2.40 cm/m  RA Area:     12.90 cm LA Vol (A2C):   61.2 ml 31.95 ml/m RA Volume:   28.60 ml  14.93 ml/m LA Vol (A4C):   92.0 ml 48.02 ml/m LA Biplane Vol: 78.2 ml 40.82 ml/m  AORTIC VALVE AV Area (Vmax):    1.82 cm AV Area (Vmean):   1.50 cm AV Area (VTI):     1.70 cm AV Vmax:           107.72 cm/s AV Vmean:          71.452 cm/s AV VTI:            0.191 m AV Peak Grad:      4.6 mmHg AV Mean Grad:      2.3 mmHg LVOT Vmax:         77.12 cm/s LVOT Vmean:        42.238 cm/s LVOT VTI:          0.128 m LVOT/AV VTI ratio: 0.67  AORTA Ao Root diam: 2.50 cm MITRAL VALVE                TRICUSPID VALVE MV Area (PHT): 4.77 cm     TR Peak grad:   33.2 mmHg MV Decel Time: 159 msec     TR Vmax:        288.00 cm/s MV E velocity: 117.00 cm/s MV A velocity: 28.40 cm/s   SHUNTS MV E/A ratio:  4.12         Systemic VTI:  0.13 m                             Systemic Diam: 1.80 cm Carlyle Dolly MD Electronically signed by Carlyle Dolly MD Signature Date/Time: 03/18/2021/2:24:49 PM    Final    CT IMAGE GUIDED DRAINAGE BY PERCUTANEOUS CATHETER  Result Date: 03/19/2021 INDICATION: Acute cholecystitis EXAM: CT -GUIDED CHOLECYSTOSTOMY TUBE PLACEMENT COMPARISON:  03/17/2021 MEDICATIONS: The patient is currently admitted to the hospital and on intravenous antibiotics. Antibiotics were administered within an appropriate time frame prior to skin puncture. ANESTHESIA/SEDATION: Moderate (conscious) sedation was employed during this procedure. A total of Versed 1.5 mg and Fentanyl 75 mcg was administered intravenously. Moderate Sedation Time: 20 minutes. The patient's level of consciousness and vital signs were monitored continuously by radiology nursing throughout the procedure under my direct supervision. CONTRAST:  None. FLUOROSCOPY TIME:  None. COMPLICATIONS: None immediate. PROCEDURE: Informed written consent was obtained from the patient after a discussion of the risks, benefits and alternatives to treatment. Questions regarding the procedure were encouraged and answered. A timeout was performed prior to the initiation of the procedure. The right upper abdominal quadrant was prepped and draped in the usual sterile fashion, and a sterile drape was applied covering the operative field. Maximum barrier sterile technique with sterile gowns  and gloves were used for the procedure. A timeout was performed prior to the initiation of the procedure. Local anesthesia was provided with 1% lidocaine with epinephrine. Limited upper abdominal CT demonstrates a markedly dilated gallbladder. Utilizing a transhepatic approach, an 18 gauge trocar needle was advanced into the gallbladder under intermittent CT guidance. An Amplatz wire was inserted and coiled within the gallbladder. Appropriate position was confirmed with limited CT. Serial dilation was performed and ultimately a 10.2-French Cook multipurpose drainage  tube was advanced into the gallbladder infundibulum, coiled and locked. Limited upper abdominal CT was then obtained to confirm adequate positioning. Bile was aspirated. The catheter was secured to the skin with suture, connected to a drainage bag and a dressing was placed. The patient tolerated the procedure well without immediate post procedural complication. IMPRESSION: Successful CT guided placement of a 10.2 French cholecystostomy tube. PLAN: Follow-up with cholecystostomy tube check and change in 2 months. Ruthann Cancer, MD [facility] Electronically Signed   By: Ruthann Cancer MD   On: 03/19/2021 16:39   VAS US CAROTID  Result Date: 04/04/2021 Carotid Arterial Duplex Study Indications:       Recurrent Syncope. Risk Factors:      Hypertension, Diabetes. Other Factors:     CHF, atrial fibrillation, pacemaker. Comparison Study:  No prior study on file. Performing Technologist: Sharion Dove RVS  Examination Guidelines: A complete evaluation includes B-mode imaging, spectral Doppler, color Doppler, and power Doppler as needed of all accessible portions of each vessel. Bilateral testing is considered an integral part of a complete examination. Limited examinations for reoccurring indications may be performed as noted.  Right Carotid Findings: +----------+--------+--------+--------+------------------+------------------+           PSV cm/sEDV cm/sStenosisPlaque DescriptionComments           +----------+--------+--------+--------+------------------+------------------+ CCA Prox  46      6                                                    +----------+--------+--------+--------+------------------+------------------+ CCA Distal57      9                                 intimal thickening +----------+--------+--------+--------+------------------+------------------+ ICA Prox  57      12              calcific                              +----------+--------+--------+--------+------------------+------------------+ ICA Distal73      20                                                   +----------+--------+--------+--------+------------------+------------------+ ECA       94      13                                                   +----------+--------+--------+--------+------------------+------------------+ +----------+--------+-------+--------+-------------------+  PSV cm/sEDV cmsDescribeArm Pressure (mmHG) +----------+--------+-------+--------+-------------------+ [id number]                                         +----------+--------+-------+--------+-------------------+ +---------+--------+--+--------+-+ VertebralPSV cm/s29EDV cm/s6 +---------+--------+--+--------+-+  Left Carotid Findings: +----------+--------+--------+--------+------------------+------------------+           PSV cm/sEDV cm/sStenosisPlaque DescriptionComments           +----------+--------+--------+--------+------------------+------------------+ CCA Prox  61      12                                intimal thickening +----------+--------+--------+--------+------------------+------------------+ CCA Distal48      13                                intimal thickening +----------+--------+--------+--------+------------------+------------------+ ICA Prox  55      23              heterogenous                         +----------+--------+--------+--------+------------------+------------------+ ICA Distal48      19                                                   +----------+--------+--------+--------+------------------+------------------+ ECA       93      11                                                   +----------+--------+--------+--------+------------------+------------------+ +----------+--------+--------+--------+-------------------+           PSV cm/sEDV cm/sDescribeArm Pressure (mmHG)  +----------+--------+--------+--------+-------------------+ [id number]                                          +----------+--------+--------+--------+-------------------+ +---------+--------+--+--------+-+ VertebralPSV cm/s34EDV cm/s8 +---------+--------+--+--------+-+   Summary: Right Carotid: The extracranial vessels were near-normal with only minimal wall                thickening or plaque. Left Carotid: The extracranial vessels were near-normal with only minimal wall               thickening or plaque. Vertebrals:  Bilateral vertebral arteries demonstrate antegrade flow. Subclavians: Normal flow hemodynamics were seen in bilateral subclavian              arteries. *See table(s) above for measurements and observations.  Electronically signed by Antony Contras MD on 04/04/2021 at 2:22:12 PM.    Final    VAS [place] LOWER EXTREMITY VENOUS (DVT)  Result Date: 03/28/2021  Lower Venous DVT Study Indications: Swelling, history of CHF.  Limitations: Poor ultrasound/tissue interface and body habitus. Comparison Study: No prior studies. Performing Technologist: Darlin Coco RDMS,RVT  Examination Guidelines: A complete evaluation includes B-mode imaging, spectral Doppler, color Doppler, and power Doppler as needed of all accessible portions of each vessel. Bilateral testing is considered an integral part of a complete examination. Limited  examinations for reoccurring indications may be performed as noted. The reflux portion of the exam is performed with the patient in reverse Trendelenburg.  +---------+---------------+---------+-----------+----------+-------------------+ RIGHT    CompressibilityPhasicitySpontaneityPropertiesThrombus Aging      +---------+---------------+---------+-----------+----------+-------------------+ CFV      Full           Yes      Yes                                      +---------+---------------+---------+-----------+----------+-------------------+ SFJ      Full                                                              +---------+---------------+---------+-----------+----------+-------------------+ FV Prox  Full                                                             +---------+---------------+---------+-----------+----------+-------------------+ FV Mid   Full                                                             +---------+---------------+---------+-----------+----------+-------------------+ FV DistalFull                                                             +---------+---------------+---------+-----------+----------+-------------------+ PFV      Full                                                             +---------+---------------+---------+-----------+----------+-------------------+ POP      Full           Yes      Yes                                      +---------+---------------+---------+-----------+----------+-------------------+ PTV      Full                                         Some segments not                                                         well visualized     +---------+---------------+---------+-----------+----------+-------------------+  PERO     Full                                         Some segments not                                                         well visualized     +---------+---------------+---------+-----------+----------+-------------------+   +---------+---------------+---------+-----------+----------+-------------------+ LEFT     CompressibilityPhasicitySpontaneityPropertiesThrombus Aging      +---------+---------------+---------+-----------+----------+-------------------+ CFV      Full           Yes      Yes                                      +---------+---------------+---------+-----------+----------+-------------------+ SFJ      Full                                                              +---------+---------------+---------+-----------+----------+-------------------+ FV Prox  Full                                                             +---------+---------------+---------+-----------+----------+-------------------+ FV Mid   Full                                                             +---------+---------------+---------+-----------+----------+-------------------+ FV DistalFull                                                             +---------+---------------+---------+-----------+----------+-------------------+ PFV      Full                                                             +---------+---------------+---------+-----------+----------+-------------------+ POP      Partial        Yes      Yes                  Age Indeterminate   +---------+---------------+---------+-----------+----------+-------------------+ PTV      Full                                                             +---------+---------------+---------+-----------+----------+-------------------+  PERO     Full                                         Some segments not                                                         well visualized     +---------+---------------+---------+-----------+----------+-------------------+     Summary: RIGHT: - There is no evidence of deep vein thrombosis in the lower extremity. However, portions of this examination were limited- see technologist comments above.  - No cystic structure found in the popliteal fossa.  LEFT: - Findings consistent with age indeterminate deep vein thrombosis involving the left popliteal vein. - No cystic structure found in the popliteal fossa. - Ultrasound characteristics of enlarged lymph nodes noted in the groin.  *See table(s) above for measurements and observations. Electronically signed by Monica Martinez MD on 03/28/2021 at 11:14:58 AM.    Final     Micro Results     Recent Results (from the  past 240 hour(s))  SARS CORONAVIRUS 2 (TAT 6-24 HRS) Nasopharyngeal Nasopharyngeal Swab     Status: None   Collection Time: 04/08/21  1:04 AM   Specimen: Nasopharyngeal Swab  Result Value Ref Range Status   SARS Coronavirus 2 NEGATIVE NEGATIVE Final    Comment: (NOTE) SARS-CoV-2 target nucleic acids are NOT DETECTED.  The SARS-CoV-2 RNA is generally detectable in upper and lower respiratory specimens during the acute phase of infection. Negative results do not preclude SARS-CoV-2 infection, do not rule out co-infections with other pathogens, and should not be used as the sole basis for treatment or other patient management decisions. Negative results must be combined with clinical observations, patient history, and epidemiological information. The expected result is Negative.  Fact Sheet for Patients: SugarRoll.be  Fact Sheet for Healthcare Providers: https://www.woods-mathews.com/  This test is not yet approved or cleared by the [place] FDA and  has been authorized for detection and/or diagnosis of SARS-CoV-2 by FDA under an Emergency Use Authorization (EUA). This EUA will remain  in effect (meaning this test can be used) for the duration of the COVID-19 declaration under Se ction 564(b)(1) of the Act, 21 U.S.C. section 360bbb-3(b)(1), unless the authorization is terminated or revoked sooner.  Performed at [facility], [place] [address], [place], [place] [address]     Today   Subjective    Tracey Bryant today has no headache,no chest abdominal pain,no new weakness tingling or numbness, feels much better     Objective   Blood pressure (!) 147/62, pulse 68, temperature 97.8 F (36.6 C), temperature source Oral, resp. rate 20, height 5\' 6"  (1.676 m), weight 78.9 kg, SpO2 91 %.   Intake/Output Summary (Last 24 hours) at 04/08/2021 0842 Last data filed at 04/08/2021 0521 Gross per 24 hour  Intake 725 ml  Output 1700 ml   Net -975 ml    Exam  Awake Alert, No new F.N deficits, Normal affect [place].AT,PERRAL Supple Neck,No JVD, No cervical lymphadenopathy appriciated.  Symmetrical Chest wall movement, Good air movement bilaterally, CTAB RRR,No Gallops,Rubs or new Murmurs, No Parasternal Heave +ve B.Sounds, Abd Soft, Non tender, No organomegaly appriciated, No rebound -guarding or rigidity.  GB drain in place No Cyanosis, Clubbing or edema, No new Rash or bruise   Data Review   CBC w Diff:  Lab Results  Component Value Date   WBC 8.5 04/07/2021   HGB 10.2 (L) 04/07/2021   HCT 33.9 (L) 04/07/2021   PLT 477 (H) 04/07/2021   LYMPHOPCT 24 04/07/2021   MONOPCT 8 04/07/2021   EOSPCT 6 04/07/2021   BASOPCT 1 04/07/2021    CMP:  Lab Results  Component Value Date   NA 137 04/07/2021   K 3.9 04/07/2021   CL 100 04/07/2021   CO2 26 04/07/2021   BUN 30 (H) 04/07/2021   CREATININE 2.39 (H) 04/07/2021   PROT 6.6 04/07/2021   ALBUMIN 2.5 (L) 04/07/2021   BILITOT 0.8 04/07/2021   ALKPHOS 52 04/07/2021   AST 23 04/07/2021   ALT 20 04/07/2021  . Lab Results  Component Value Date   HGBA1C 9.1 (H) 03/16/2021   Lab Results  Component Value Date   CHOL 92 04/05/2021   HDL 37 (L) 04/05/2021   LDLCALC 39 04/05/2021   TRIG 82 04/05/2021   CHOLHDL 2.5 04/05/2021     Total Time in preparing paper work, data evaluation and todays exam - 10 minutes  Lala Lund M.D on 04/08/2021 at 8:42 AM  [facility]

## 2021-04-08 NOTE — Discharge Instructions (Signed)
Follow with Primary MD Coolidge Breeze, FNP in 7 days   Get CBC, CMP, 2 view Chest X ray -  checked next visit within 1 week by Primary MD or SNF MD   Activity: As tolerated with Full fall precautions use walker/cane & assistance as needed  Disposition SNF  Diet: Heart Healthy Low Carb. Accuchecks QAC-HS, 1.5 L daily strict fluid restriction .  Special Instructions: If you have smoked or chewed Tobacco  in the last 2 yrs please stop smoking, stop any regular Alcohol  and or any Recreational drug use.  On your next visit with your primary care physician please Get Medicines reviewed and adjusted.  Please request your Prim.MD to go over all Hospital Tests and Procedure/Radiological results at the follow up, please get all Hospital records sent to your Prim MD by signing hospital release before you go home.  If you experience worsening of your admission symptoms, develop shortness of breath, life threatening emergency, suicidal or homicidal thoughts you must seek medical attention immediately by calling 911 or calling your MD immediately  if symptoms less severe.  You Must read complete instructions/literature along with all the possible adverse reactions/side effects for all the Medicines you take and that have been prescribed to you. Take any new Medicines after you have completely understood and accpet all the possible adverse reactions/side effects.

## 2021-04-08 NOTE — TOC Transition Note (Signed)
Transition of Care [facility]) - CM/SW Discharge Note   Patient Details  Name: Tracey Bryant MRN: [id number] Date of Birth: Feb 05, 1951  [facility] CM/SW Contact:  Benard Halsted, LCSW Phone Number: 04/08/2021, 10:59 AM   Clinical Narrative:    Patient will DC to: [facility] SN[facility] Anticipated DC date: 04/08/21 Family notified: Son, [name] Transport by: Corey Harold   Per MD patient ready for DC to [facility]. RN to call report prior to discharge [phone]). RN, patient, patient's family, and facility notified of DC. Discharge Summary and FL2 sent to facility. DC packet on chart. Ambulance transport requested for patient.   CSW will sign off for now as social work intervention is no longer needed. Please consult [place] again if new needs arise.      Final next level of care: Skilled Nursing Facility Barriers to Discharge: Barriers Resolved   Patient Goals and CMS Choice Patient states their goals for this hospitalization and ongoing recovery are:: get better CMS Medicare.gov Compare Post Acute Care list provided to:: Patient Choice offered to / list presented to : [place]  Discharge Placement              Patient chooses bed at: [facility] [place] Patient to be transferred to facility by: [place] Name of family member notified: Son, [name] Patient and family notified of of transfer: 04/08/21  Discharge Plan and Services In-house Referral: Clinical Social Work   Post Acute Care Choice: [place]                               Social Determinants of Health ([place]) Interventions     Readmission Risk Interventions Readmission Risk Prevention Plan 03/19/2021 03/17/2021  Transportation Screening - Complete  PCP or Specialist Appt within 5-7 Days Complete -  Home Care Screening Complete -  Medication Review (RN CM) - Complete  Some recent data might be hidden

## 2021-04-09 ENCOUNTER — Telehealth (HOSPITAL_COMMUNITY): Payer: Self-pay

## 2021-04-09 NOTE — Telephone Encounter (Signed)
Called to schedule drain exchange, no answer, left vm. AW 

## 2021-05-13 ENCOUNTER — Other Ambulatory Visit: Payer: Self-pay

## 2021-05-13 ENCOUNTER — Ambulatory Visit (HOSPITAL_COMMUNITY)
Admission: RE | Admit: 2021-05-13 | Discharge: 2021-05-13 | Disposition: A | Discharge status: Home / Self Care | Payer: Medicare HMO | Source: Ambulatory Visit | Attending: Radiology | Admitting: Radiology

## 2021-05-13 DIAGNOSIS — K819 Cholecystitis, unspecified: Secondary | ICD-10-CM | POA: Diagnosis not present

## 2021-05-13 DIAGNOSIS — Z4803 Encounter for change or removal of drains: Secondary | ICD-10-CM | POA: Insufficient documentation

## 2021-05-13 HISTORY — PX: IR CHOLANGIOGRAM EXISTING TUBE: IMG6040

## 2021-05-13 IMAGING — XA IR CHOLANGIOGRAM VIA EXIST CATHETER
1 series · 10 of 10 positions shown · non-contrast
Comparison: none

INDICATION: 69-year-old woman with acute cholecystitis, treated by
cholecystostomy drain placement on [DATE], returns to
interventional radiology for cholangiogram and exchange.

[Series 300: ir cholangiogram existing tube · 10 of 10 slices shown]
[im 1/10]
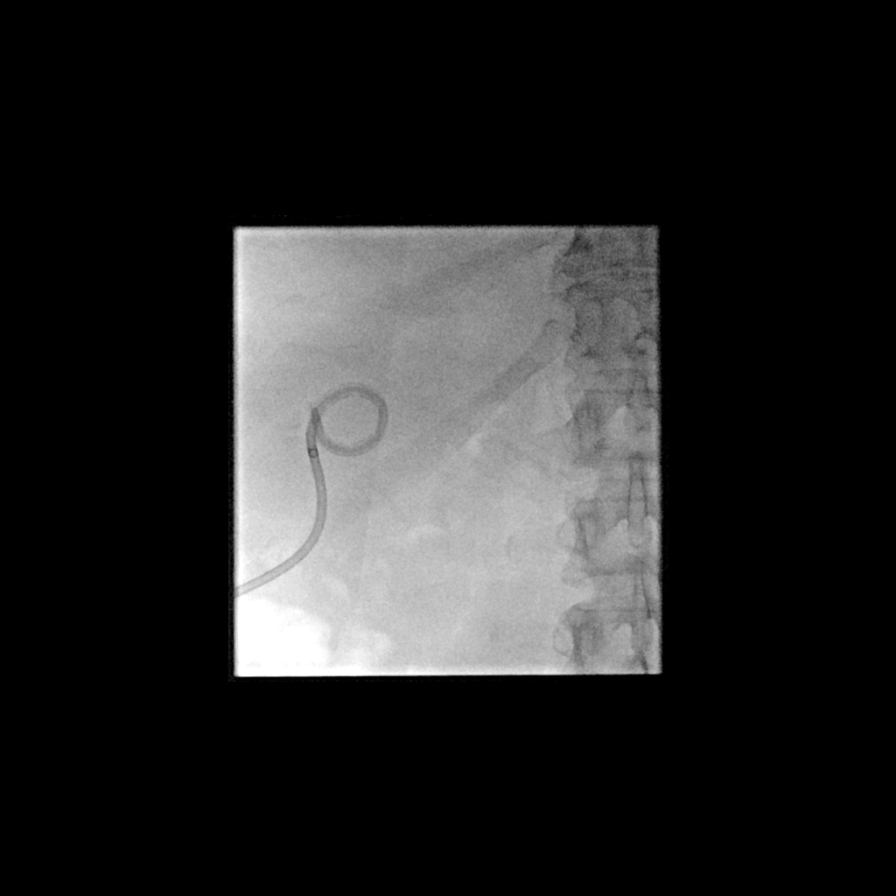
[im 2/10]
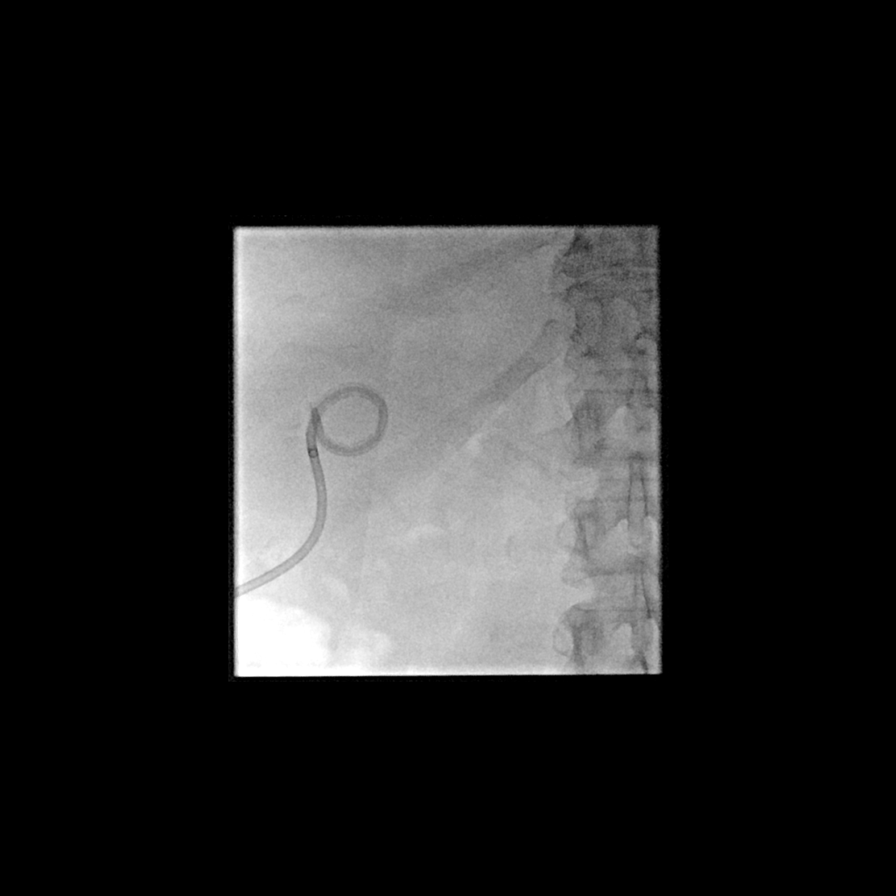
[im 3/10]
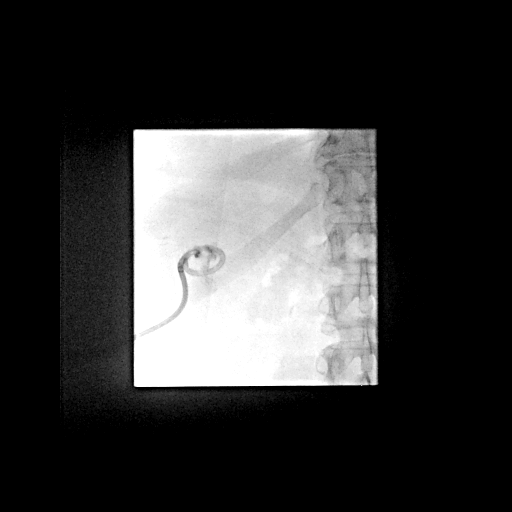
[im 4/10]
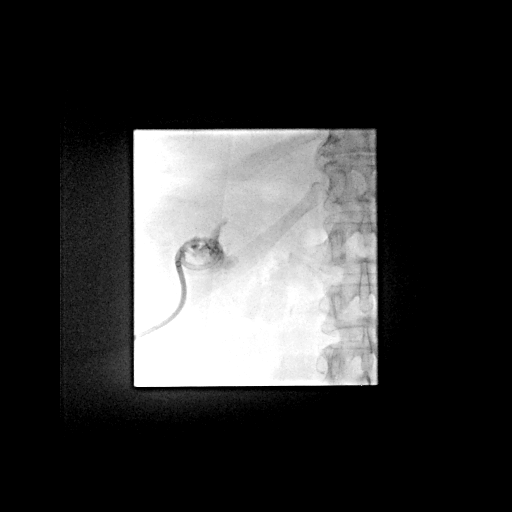
[im 5/10]
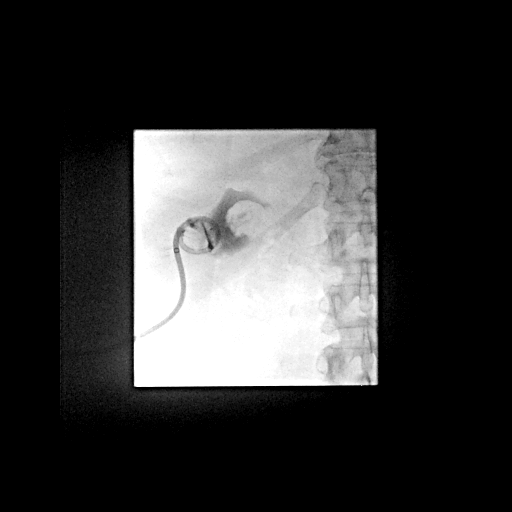
[im 6/10]
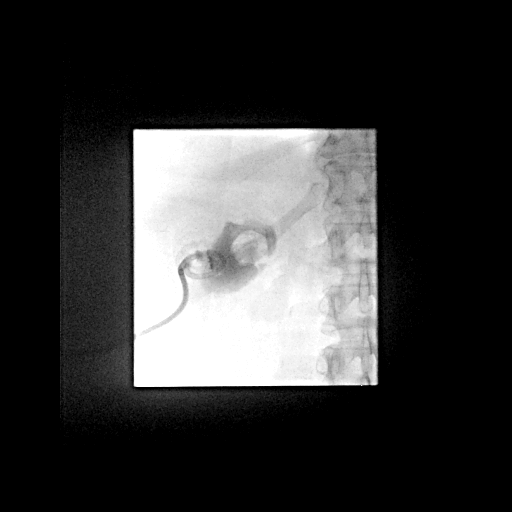
[im 7/10]
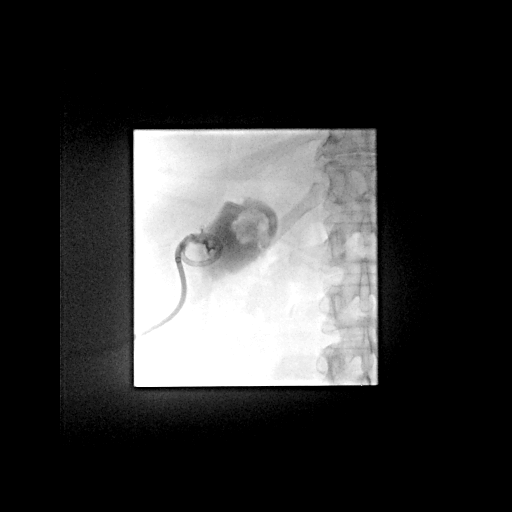
[im 8/10]
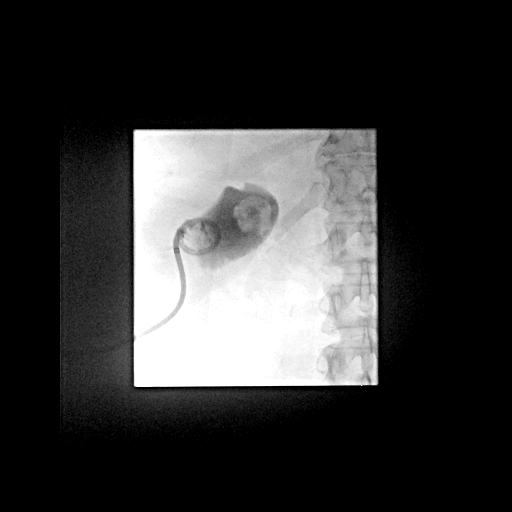
[im 9/10]
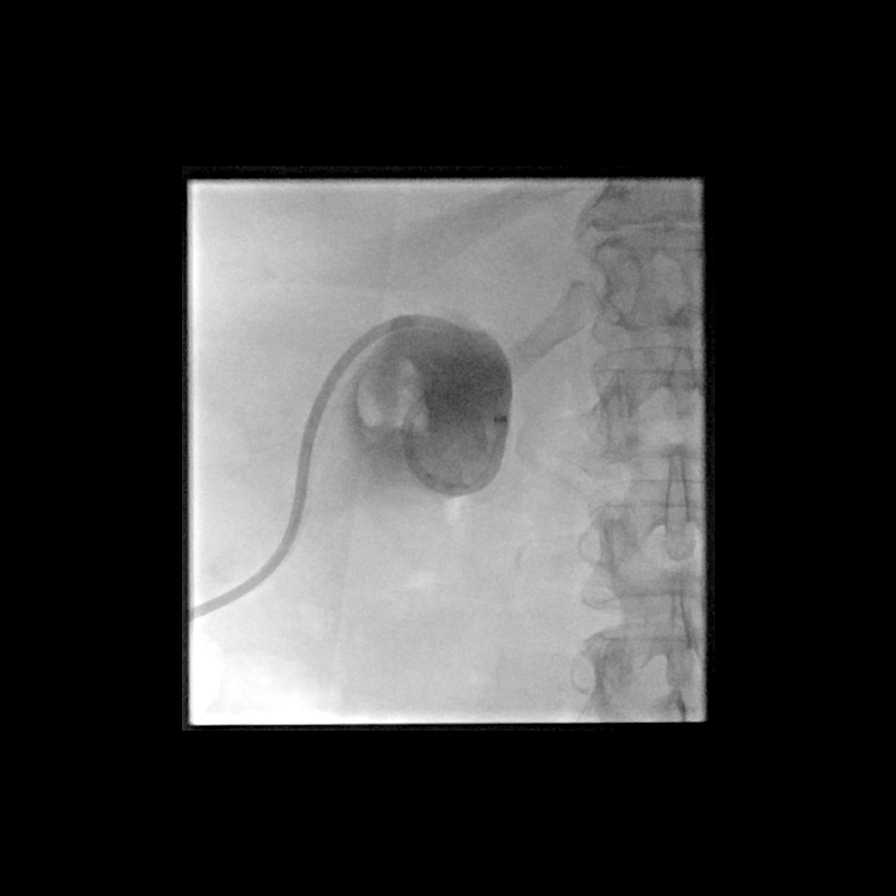
[im 10/10]
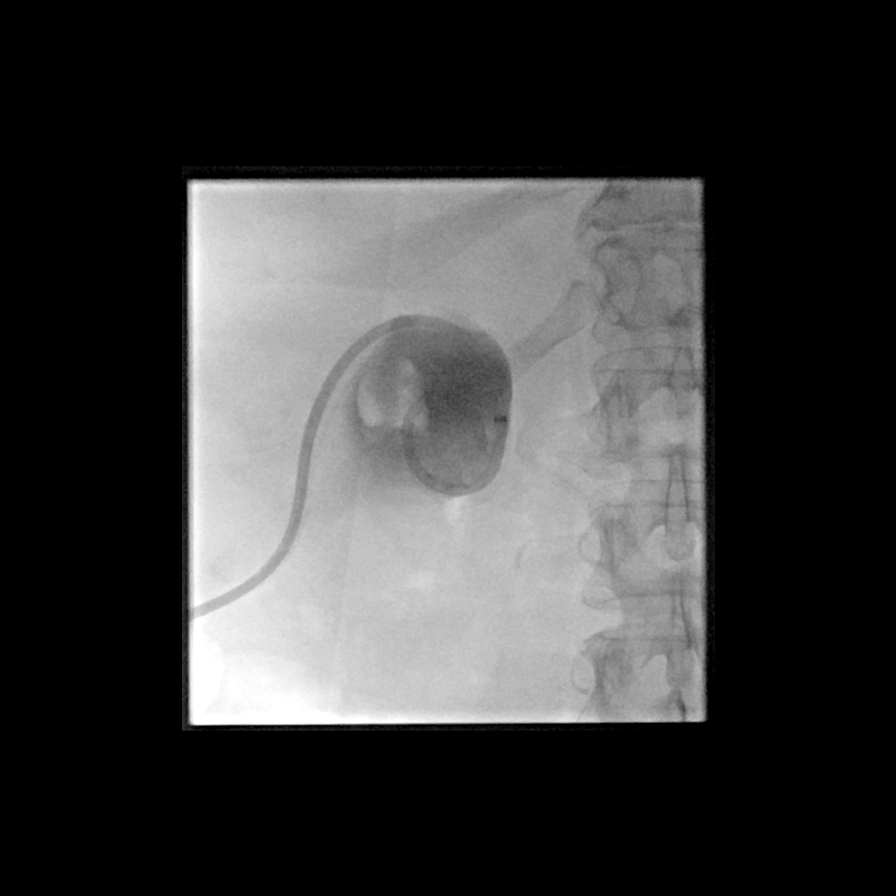

[10 of 10 positions shown; findings below may reference images not displayed]

EXAM:
Percutaneous cholangiogram and cholecystostomy drain exchange

MEDICATIONS:
None

ANESTHESIA/SEDATION:
None

FLUOROSCOPY TIME:  Fluoroscopy Time: 2 minutes 0 seconds (13 mGy).

COMPLICATIONS:
None immediate.

PROCEDURE:
Informed written consent was obtained from the patient after a
thorough discussion of the procedural risks, benefits and
alternatives. All questions were addressed. Maximal Sterile Barrier
Technique was utilized including caps, mask, sterile gowns, sterile
gloves, sterile drape, hand hygiene and skin antiseptic. A timeout
was performed prior to the initiation of the procedure.

Patient positioned supine on the procedure table. The external
segment of the right upper quadrant 10.2 French cholecystostomy
drain and surrounding skin prepped and draped in usual fashion.

Contrast administered through the existing drain under fluoroscopy
again showed the gallstone within the gallbladder lumen. There is
persistent obstruction of the cystic duct.

Given persistent obstruction of the cystic duct, the existing drain
was cut and removed over a 0.035 inch guidewire and replaced with a
new 10.2 French multipurpose pigtail drain.

Contrast administered through the new drain confirmed appropriate
positioning of the pigtail in the gallbladder lumen under
fluoroscopy.

Drain secured to skin with suture and connected to bag.
IMPRESSION: Percutaneous cholangiogram demonstrates persistent obstruction of
the cystic duct. Existing 10.2 French cholecystostomy drain exchange
for new 10.2 French cholecystostomy drain.

PLAN:
1. Patient should return in 8 weeks for routine exchange.
2. Case discussed with nurse DEIMER JOSE at [facility] [REDACTED]. Recommend
surgical evaluation and consultation for possible cholecystectomy.
If the patient is not a surgical candidate, please consult
interventional radiology as she would be a candidate for [place]
gallstone fragmentation and extraction.

## 2021-05-13 MED ORDER — LIDOCAINE HCL (PF) 1 % IJ SOLN
INTRAMUSCULAR | Status: AC | PRN
  Administered 2021-05-13: 5 mL

## 2021-05-13 MED ORDER — IOHEXOL 300 MG/ML  SOLN
50.0000 mL | Freq: Once | INTRAMUSCULAR | Status: AC | PRN
  Administered 2021-05-13: 15 mL

## 2021-05-13 MED ORDER — LIDOCAINE HCL 1 % IJ SOLN
INTRAMUSCULAR | Status: AC
  Filled 2021-05-13: qty 20

## 2021-05-13 NOTE — Telephone Encounter (Signed)
Spoke with Freda Munro at [facility] and let her know we have tried to sch this patient since march 28th and that we are closing this referral. She said that was okay and if she needed [place] she would give [place] a call

## 2021-05-13 NOTE — Procedures (Signed)
Interventional Radiology Procedure Note  Procedure: Cholecystostomy drain exchange  Indication: Cholecystitis  Findings: Please refer to procedural dictation for full description.  Complications: None  EBL: < 10 mL  Miachel Roux, MD [phone]

## 2021-07-26 ENCOUNTER — Inpatient Hospital Stay (HOSPITAL_COMMUNITY)
Admission: EM | Admit: 2021-07-26 | Discharge: 2021-08-01 | DRG: 291 | Disposition: A | Discharge status: Home / Self Care | Payer: Medicare HMO | Attending: Internal Medicine | Admitting: Internal Medicine

## 2021-07-26 ENCOUNTER — Emergency Department (HOSPITAL_COMMUNITY): Payer: Medicare HMO

## 2021-07-26 ENCOUNTER — Other Ambulatory Visit: Payer: Self-pay

## 2021-07-26 ENCOUNTER — Encounter (HOSPITAL_COMMUNITY): Payer: Self-pay | Admitting: *Deleted

## 2021-07-26 DIAGNOSIS — N1832 Chronic kidney disease, stage 3b: Secondary | ICD-10-CM | POA: Diagnosis not present

## 2021-07-26 DIAGNOSIS — T502X5A Adverse effect of carbonic-anhydrase inhibitors, benzothiadiazides and other diuretics, initial encounter: Secondary | ICD-10-CM | POA: Diagnosis not present

## 2021-07-26 DIAGNOSIS — J9811 Atelectasis: Secondary | ICD-10-CM | POA: Diagnosis present

## 2021-07-26 DIAGNOSIS — R0902 Hypoxemia: Secondary | ICD-10-CM

## 2021-07-26 DIAGNOSIS — D75839 Thrombocytosis, unspecified: Secondary | ICD-10-CM | POA: Diagnosis not present

## 2021-07-26 DIAGNOSIS — T85628A Displacement of other specified internal prosthetic devices, implants and grafts, initial encounter: Secondary | ICD-10-CM | POA: Diagnosis present

## 2021-07-26 DIAGNOSIS — D631 Anemia in chronic kidney disease: Secondary | ICD-10-CM | POA: Diagnosis present

## 2021-07-26 DIAGNOSIS — N179 Acute kidney failure, unspecified: Secondary | ICD-10-CM | POA: Diagnosis not present

## 2021-07-26 DIAGNOSIS — E440 Moderate protein-calorie malnutrition: Secondary | ICD-10-CM | POA: Diagnosis not present

## 2021-07-26 DIAGNOSIS — M1990 Unspecified osteoarthritis, unspecified site: Secondary | ICD-10-CM | POA: Diagnosis present

## 2021-07-26 DIAGNOSIS — Z794 Long term (current) use of insulin: Secondary | ICD-10-CM

## 2021-07-26 DIAGNOSIS — E1122 Type 2 diabetes mellitus with diabetic chronic kidney disease: Secondary | ICD-10-CM | POA: Diagnosis present

## 2021-07-26 DIAGNOSIS — I82409 Acute embolism and thrombosis of unspecified deep veins of unspecified lower extremity: Secondary | ICD-10-CM | POA: Diagnosis present

## 2021-07-26 DIAGNOSIS — E119 Type 2 diabetes mellitus without complications: Secondary | ICD-10-CM

## 2021-07-26 DIAGNOSIS — Z20822 Contact with and (suspected) exposure to covid-19: Secondary | ICD-10-CM | POA: Diagnosis not present

## 2021-07-26 DIAGNOSIS — I5043 Acute on chronic combined systolic (congestive) and diastolic (congestive) heart failure: Secondary | ICD-10-CM | POA: Diagnosis not present

## 2021-07-26 DIAGNOSIS — E1151 Type 2 diabetes mellitus with diabetic peripheral angiopathy without gangrene: Secondary | ICD-10-CM | POA: Diagnosis present

## 2021-07-26 DIAGNOSIS — Z8673 Personal history of transient ischemic attack (TIA), and cerebral infarction without residual deficits: Secondary | ICD-10-CM

## 2021-07-26 DIAGNOSIS — I480 Paroxysmal atrial fibrillation: Secondary | ICD-10-CM | POA: Diagnosis present

## 2021-07-26 DIAGNOSIS — I272 Pulmonary hypertension, unspecified: Secondary | ICD-10-CM | POA: Diagnosis present

## 2021-07-26 DIAGNOSIS — Z833 Family history of diabetes mellitus: Secondary | ICD-10-CM

## 2021-07-26 DIAGNOSIS — E11649 Type 2 diabetes mellitus with hypoglycemia without coma: Secondary | ICD-10-CM

## 2021-07-26 DIAGNOSIS — T85518A Breakdown (mechanical) of other gastrointestinal prosthetic devices, implants and grafts, initial encounter: Secondary | ICD-10-CM | POA: Diagnosis present

## 2021-07-26 DIAGNOSIS — Z7901 Long term (current) use of anticoagulants: Secondary | ICD-10-CM | POA: Diagnosis not present

## 2021-07-26 DIAGNOSIS — Z79899 Other long term (current) drug therapy: Secondary | ICD-10-CM

## 2021-07-26 DIAGNOSIS — Z950 Presence of cardiac pacemaker: Secondary | ICD-10-CM

## 2021-07-26 DIAGNOSIS — D649 Anemia, unspecified: Secondary | ICD-10-CM | POA: Diagnosis present

## 2021-07-26 DIAGNOSIS — Z86718 Personal history of other venous thrombosis and embolism: Secondary | ICD-10-CM | POA: Diagnosis not present

## 2021-07-26 DIAGNOSIS — J9601 Acute respiratory failure with hypoxia: Secondary | ICD-10-CM | POA: Diagnosis not present

## 2021-07-26 DIAGNOSIS — I252 Old myocardial infarction: Secondary | ICD-10-CM

## 2021-07-26 DIAGNOSIS — I5023 Acute on chronic systolic (congestive) heart failure: Secondary | ICD-10-CM | POA: Diagnosis present

## 2021-07-26 DIAGNOSIS — Z8249 Family history of ischemic heart disease and other diseases of the circulatory system: Secondary | ICD-10-CM | POA: Diagnosis not present

## 2021-07-26 DIAGNOSIS — I429 Cardiomyopathy, unspecified: Secondary | ICD-10-CM | POA: Diagnosis not present

## 2021-07-26 DIAGNOSIS — R0602 Shortness of breath: Secondary | ICD-10-CM | POA: Diagnosis not present

## 2021-07-26 DIAGNOSIS — I130 Hypertensive heart and chronic kidney disease with heart failure and stage 1 through stage 4 chronic kidney disease, or unspecified chronic kidney disease: Principal | ICD-10-CM | POA: Diagnosis present

## 2021-07-26 DIAGNOSIS — I10 Essential (primary) hypertension: Secondary | ICD-10-CM | POA: Diagnosis present

## 2021-07-26 HISTORY — DX: Paroxysmal atrial fibrillation: I48.0

## 2021-07-26 HISTORY — DX: Acute embolism and thrombosis of unspecified deep veins of unspecified lower extremity: I82.409

## 2021-07-26 LAB — CBC WITH DIFFERENTIAL/PLATELET
Abs Immature Granulocytes: 0.07 10*3/uL (ref 0.00–0.07)
Basophils Absolute: 0.1 10*3/uL (ref 0.0–0.1)
Basophils Relative: 1 %
Eosinophils Absolute: 0.3 10*3/uL (ref 0.0–0.5)
Eosinophils Relative: 3 %
HCT: 32.4 % — ABNORMAL LOW (ref 36.0–46.0)
Hemoglobin: 10.1 g/dL — ABNORMAL LOW (ref 12.0–15.0)
Immature Granulocytes: 1 %
Lymphocytes Relative: 14 %
Lymphs Abs: 1.5 10*3/uL (ref 0.7–4.0)
MCH: 30.1 pg (ref 26.0–34.0)
MCHC: 31.2 g/dL (ref 30.0–36.0)
MCV: 96.7 fL (ref 80.0–100.0)
Monocytes Absolute: 0.9 10*3/uL (ref 0.1–1.0)
Monocytes Relative: 8 %
Neutro Abs: 8.3 10*3/uL — ABNORMAL HIGH (ref 1.7–7.7)
Neutrophils Relative %: 73 %
Platelets: 452 10*3/uL — ABNORMAL HIGH (ref 150–400)
RBC: 3.35 MIL/uL — ABNORMAL LOW (ref 3.87–5.11)
RDW: 17.6 % — ABNORMAL HIGH (ref 11.5–15.5)
WBC: 11.3 10*3/uL — ABNORMAL HIGH (ref 4.0–10.5)
nRBC: 0 % (ref 0.0–0.2)

## 2021-07-26 LAB — TROPONIN I (HIGH SENSITIVITY)
Troponin I (High Sensitivity): 14 ng/L (ref <18)
Troponin I (High Sensitivity): 15 ng/L (ref <18)

## 2021-07-26 LAB — COMPREHENSIVE METABOLIC PANEL
ALT: 10 U/L (ref 0–44)
AST: 17 U/L (ref 15–41)
Albumin: 2.9 g/dL — ABNORMAL LOW (ref 3.5–5.0)
Alkaline Phosphatase: 54 U/L (ref 38–126)
Anion gap: 7 (ref 5–15)
BUN: 23 mg/dL (ref 8–23)
CO2: 20 mmol/L — ABNORMAL LOW (ref 22–32)
Calcium: 8.4 mg/dL — ABNORMAL LOW (ref 8.9–10.3)
Chloride: 111 mmol/L (ref 98–111)
Creatinine, Ser: 1.78 mg/dL — ABNORMAL HIGH (ref 0.44–1.00)
GFR, Estimated: 31 mL/min — ABNORMAL LOW (ref >60)
Glucose, Bld: 78 mg/dL (ref 70–99)
Potassium: 4 mmol/L (ref 3.5–5.1)
Sodium: 138 mmol/L (ref 135–145)
Total Bilirubin: 0.9 mg/dL (ref 0.3–1.2)
Total Protein: 6.5 g/dL (ref 6.5–8.1)

## 2021-07-26 LAB — RESP PANEL BY RT-PCR (FLU A&B, COVID) ARPGX2
Influenza A by PCR: NEGATIVE
Influenza B by PCR: NEGATIVE
SARS Coronavirus 2 by RT PCR: NEGATIVE

## 2021-07-26 LAB — LIPASE, BLOOD: Lipase: 31 U/L (ref 11–51)

## 2021-07-26 LAB — BRAIN NATRIURETIC PEPTIDE: B Natriuretic Peptide: 1291 pg/mL — ABNORMAL HIGH (ref 0.0–100.0)

## 2021-07-26 IMAGING — DX DG CHEST 1V PORT
1 series · 1 of 1 positions shown · non-contrast
Comparison: [DATE]

CLINICAL DATA: Shortness of breath for 4 days

EXAM:
PORTABLE CHEST 1 VIEW

[chest ap]
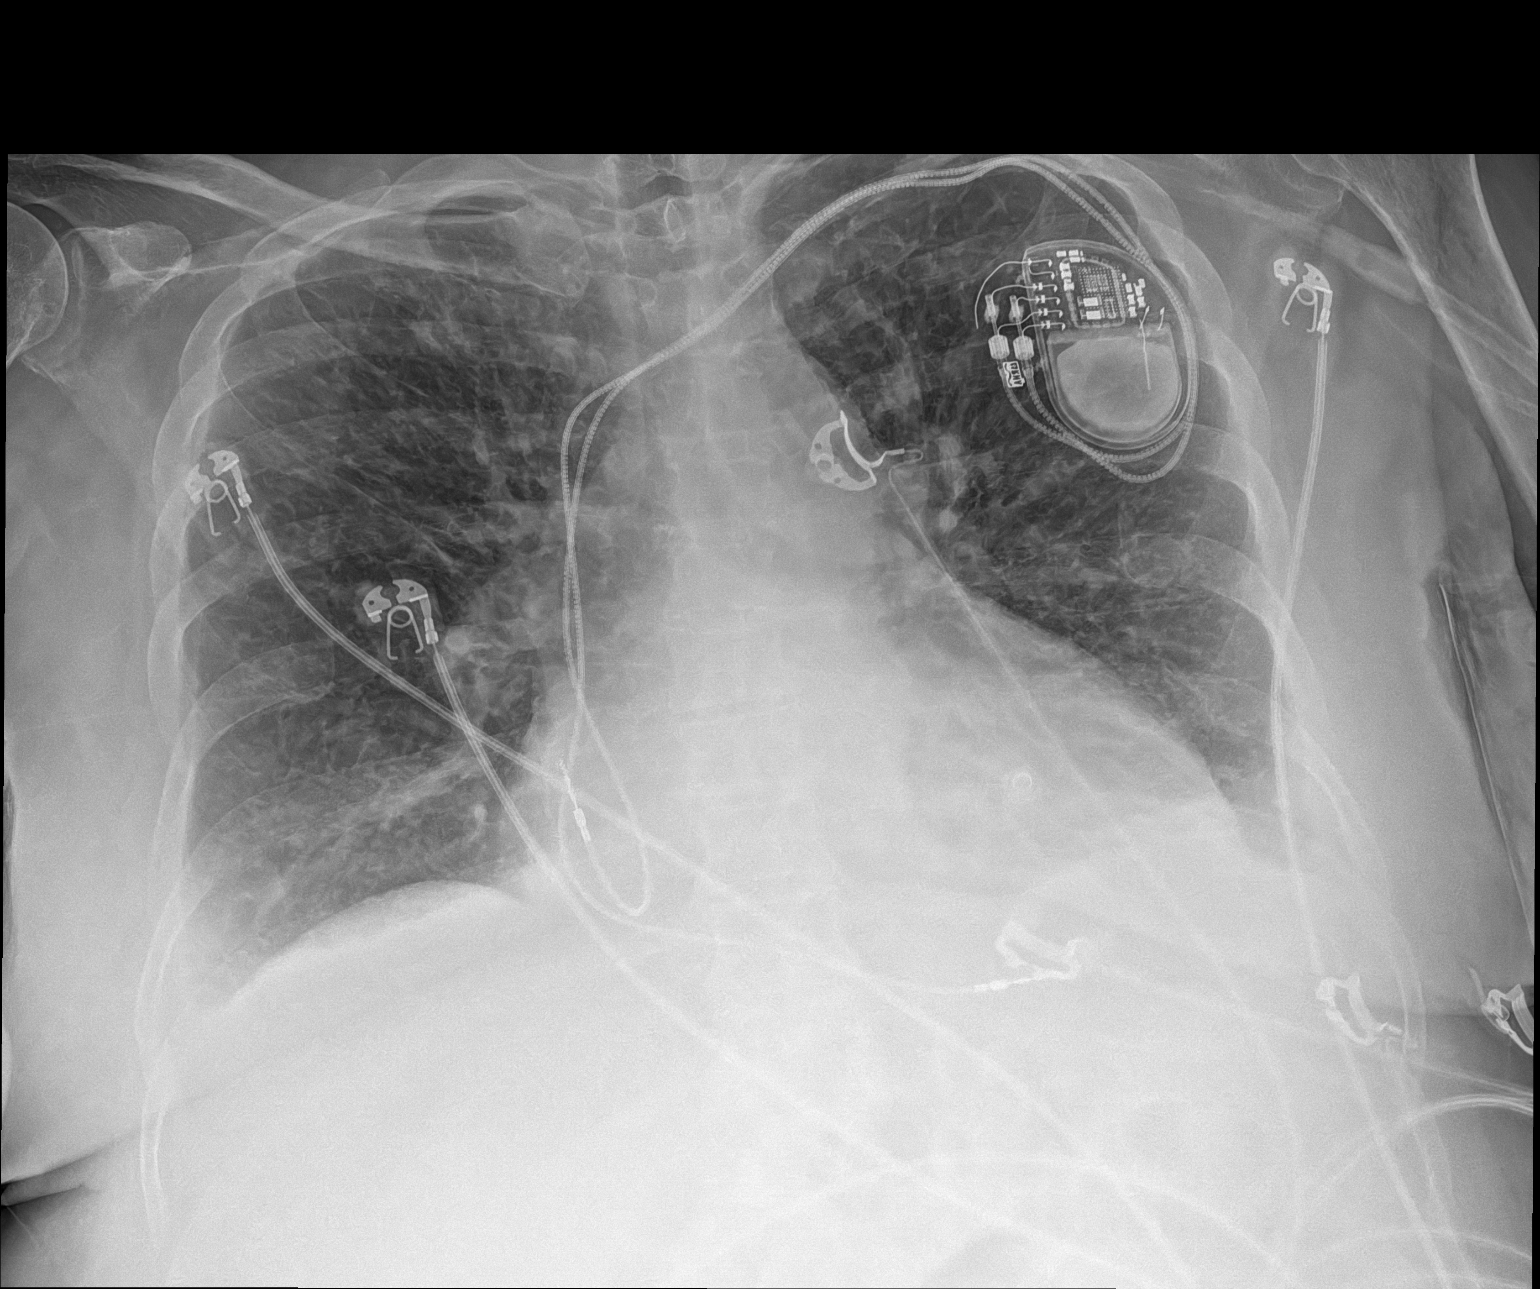

[1 of 1 positions shown; findings below may reference images not displayed]

FINDINGS: Pacer with leads at right atrium and right ventricle. No lead
discontinuity. Midline trachea. Moderate cardiomegaly. Probable
small bilateral pleural effusions. No pneumothorax. Pulmonary
interstitial prominence and indistinctness is mildly increased.
Similar left base opacity.
IMPRESSION: Cardiomegaly with worsened interstitial edema and probable small
bilateral pleural effusions.

Similar left base airspace disease, likely atelectasis.

## 2021-07-26 MED ORDER — ONDANSETRON HCL 4 MG/2ML IJ SOLN: 4.0000 mg | Freq: Four times a day (QID) | INTRAMUSCULAR | Status: DC | PRN

## 2021-07-26 MED ORDER — ONDANSETRON HCL 4 MG PO TABS
4.0000 mg | ORAL_TABLET | Freq: Four times a day (QID) | ORAL | Status: DC | PRN
  Administered 2021-07-29 – 2021-07-30 (×4): 4 mg via ORAL
  Filled 2021-07-26 (×4): qty 1

## 2021-07-26 MED ORDER — ACETAMINOPHEN 650 MG RE SUPP: 650.0000 mg | Freq: Four times a day (QID) | RECTAL | Status: DC | PRN

## 2021-07-26 MED ORDER — INSULIN ASPART 100 UNIT/ML IJ SOLN
0.0000 [IU] | Freq: Three times a day (TID) | INTRAMUSCULAR | Status: DC
  Administered 2021-07-27: 2 [IU] via SUBCUTANEOUS
  Administered 2021-07-27: 1 [IU] via SUBCUTANEOUS
  Administered 2021-07-28: 3 [IU] via SUBCUTANEOUS
  Administered 2021-07-28 (×2): 2 [IU] via SUBCUTANEOUS
  Administered 2021-07-29: 3 [IU] via SUBCUTANEOUS
  Administered 2021-07-29: 2 [IU] via SUBCUTANEOUS

## 2021-07-26 MED ORDER — ACETAMINOPHEN 325 MG PO TABS
650.0000 mg | ORAL_TABLET | Freq: Four times a day (QID) | ORAL | Status: DC | PRN
  Administered 2021-07-27 – 2021-07-30 (×2): 650 mg via ORAL
  Filled 2021-07-26 (×2): qty 2

## 2021-07-26 MED ORDER — FUROSEMIDE 10 MG/ML IJ SOLN
40.0000 mg | Freq: Two times a day (BID) | INTRAMUSCULAR | Status: DC
  Administered 2021-07-27 – 2021-07-29 (×6): 40 mg via INTRAVENOUS
  Filled 2021-07-26 (×6): qty 4

## 2021-07-26 MED ORDER — FUROSEMIDE 10 MG/ML IJ SOLN
80.0000 mg | Freq: Once | INTRAMUSCULAR | Status: AC
  Administered 2021-07-26: 80 mg via INTRAVENOUS
  Filled 2021-07-26: qty 8

## 2021-07-26 MED ORDER — EZETIMIBE 10 MG PO TABS
10.0000 mg | ORAL_TABLET | Freq: Every day | ORAL | Status: DC
  Administered 2021-07-27 – 2021-08-01 (×6): 10 mg via ORAL
  Filled 2021-07-26 (×6): qty 1

## 2021-07-26 MED ORDER — APIXABAN 5 MG PO TABS
5.0000 mg | ORAL_TABLET | Freq: Two times a day (BID) | ORAL | Status: DC
  Administered 2021-07-27 – 2021-08-01 (×12): 5 mg via ORAL
  Filled 2021-07-26 (×13): qty 1

## 2021-07-26 MED ORDER — INSULIN GLARGINE-YFGN 100 UNIT/ML ~~LOC~~ SOLN
25.0000 [IU] | Freq: Every day | SUBCUTANEOUS | Status: DC
  Filled 2021-07-26 (×2): qty 0.25

## 2021-07-26 MED ORDER — SENNOSIDES-DOCUSATE SODIUM 8.6-50 MG PO TABS: 1.0000 | ORAL_TABLET | Freq: Every evening | ORAL | Status: DC | PRN

## 2021-07-26 MED ORDER — TAMSULOSIN HCL 0.4 MG PO CAPS
0.4000 mg | ORAL_CAPSULE | Freq: Every morning | ORAL | Status: DC
  Administered 2021-07-27 – 2021-08-01 (×6): 0.4 mg via ORAL
  Filled 2021-07-26 (×6): qty 1

## 2021-07-26 MED ORDER — AMLODIPINE BESYLATE 10 MG PO TABS
10.0000 mg | ORAL_TABLET | Freq: Every day | ORAL | Status: DC
  Administered 2021-07-27: 10 mg via ORAL
  Filled 2021-07-26: qty 1

## 2021-07-26 NOTE — ED Provider Notes (Signed)
[facility] EMERGENCY DEPARTMENT Provider Note   CSN: [id number] Arrival date & time: 07/26/21  1506     History Chief Complaint  Patient presents with   Shortness of Breath    Pedal edema    Tracey Bryant is a 70 y.o. female.  Patient with a known history of chronic systolic heart failure type 2 diabetes atrial fibrillation hypertension.  And had a cholecystostomy tube that was placed in March.  That was read done by interventional radiology in May.  And was scheduled to have it redone mid July.  But patient did not follow-up.  Patient states tube fell out today.  Patient's been having a complaint of shortness of breath for a week which is gotten worse here today.  Patient says she has had increased fluid in her legs.  She is not normally on oxygen.  She was hypoxic on room air here to below 90%.  On 2 L patient's oxygen sats were 92%.  Patient denies any fevers denies any abdominal pain.  Patient put a Band-Aid over where the cholecystostomy tube was.      Past Medical History:  Diagnosis Date   Arthritis    Cardiomyopathy ([place])    Diabetes mellitus without complication ([place])    DVT (deep venous thrombosis) ([place]) 07/26/2021   Hypertension    Paroxysmal atrial fibrillation ([place]) 07/26/2021   Stroke [facility])     Patient Active Problem List   Diagnosis Date Noted   Acute on chronic combined systolic and diastolic congestive heart failure ([place]) 07/26/2021   Paroxysmal atrial fibrillation ([place]) 07/26/2021   DVT (deep venous thrombosis) ([place]) 07/26/2021   Normocytic anemia 07/26/2021   Cholecystostomy tube dysfunction 07/26/2021   Moderate protein-calorie malnutrition ([place]) 07/26/2021   Posterior cerebral artery embolism, right    Severe sepsis ([place]) 03/19/2021   Acute cholecystitis 03/19/2021   Acute lower UTI 03/19/2021   Cholelithiasis without cholangitis 03/18/2021   Uncontrolled type 2 diabetes mellitus with hyperglycemia, with long-term current use of insulin ([place])  03/18/2021   DKA (diabetic ketoacidosis) ([place]) 03/16/2021   AKI (acute kidney injury) ([place]) 03/16/2021   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     Family History  Problem Relation Age of Onset   Cancer Mother    Diabetes Mother    Heart disease Father     Social History   Tobacco Use   Smoking status: Never   Smokeless tobacco: Never  Vaping Use   Vaping Use: Never used  Substance Use Topics   Alcohol use: No   Drug use: Never    Home Medications Prior to Admission medications   Medication Sig Start Date End Date Taking? Authorizing Provider  acetaminophen (TYLENOL) 500 MG tablet Take 1 tablet (500 mg total) by mouth every 8 (eight) hours as needed for moderate pain. 04/08/21 04/08/22 Yes [name], MD  amLODipine (NORVASC) 10 MG tablet Take 10  mg by mouth daily. 07/06/21  Yes [provider]  apixaban (ELIQUIS) 5 MG TABS tablet Take 5 mg by mouth 2 (two) times daily.   Yes [provider]  carvedilol (COREG) 3.125 MG tablet Take 1 tablet (3.125 mg total) by mouth 2 (two) times daily with a meal. 04/08/21  Yes [name], MD  ezetimibe (ZETIA) 10 MG tablet Take 10 mg by mouth daily.   Yes [provider]  furosemide (LASIX) 20 MG tablet Take 20 mg by mouth daily.   Yes [provider]  insulin aspart (NOVOLOG) 100 UNIT/ML injection Substitute to any brand approved.Before  each meal 3 times a day, 140-199 - 2 units, 200-250 - 4 units, 251-299 - 6 units,  300-349 - 8 units,  350 or above 10 units. Dispense syringes and needles as needed, Ok to switch to PEN if approved. DX DM2, Code E11.65 04/08/21  Yes [name], MD  insulin glargine (LANTUS) 100 UNIT/ML injection Inject 0.25 mLs (25 Units total) into the skin at bedtime. 04/08/21  Yes [name], MD  senna-docusate (SENOKOT-S) 8.6-50 MG tablet Take 1 tablet by mouth at bedtime as needed for mild constipation. 04/08/21  Yes [name], MD  tamsulosin (FLOMAX) 0.4 MG CAPS capsule Take 0.4 mg by mouth every morning. 04/20/21  Yes [provider]    Allergies    Patient has no known allergies.  Review of Systems   Review of Systems  Constitutional:  Negative for chills and fever.  HENT:  Negative for ear pain and sore throat.   Eyes:  Negative for pain and visual disturbance.  Respiratory:  Positive for shortness of breath. Negative for cough.   Cardiovascular:  Positive for leg swelling. Negative for chest pain and palpitations.  Gastrointestinal:  Negative for abdominal pain and vomiting.  Genitourinary:  Negative for dysuria and hematuria.  Musculoskeletal:  Negative for arthralgias and back pain.  Skin:  Negative for color change and rash.  Neurological:  Negative for seizures and syncope.  All other systems reviewed and are negative.  Physical Exam Updated Vital Signs BP (!) 154/76   Pulse 79   Temp 98.2 F (36.8 C) (Oral)   Resp (!) 23   Ht 1.676 m (5\' 6" )   Wt 78.5 kg   SpO2 94%   BMI 27.92 kg/m   Physical Exam Vitals and nursing note reviewed.  Constitutional:      General: She is in acute distress.     Appearance: Normal appearance. She is well-developed.  HENT:     Head: Normocephalic and atraumatic.  Eyes:     Extraocular Movements: Extraocular movements intact.     Conjunctiva/sclera: Conjunctivae normal.     Pupils: Pupils are equal, round, and reactive  to light.  Cardiovascular:     Rate and Rhythm: Normal rate and regular rhythm.     Heart sounds: No murmur heard. Pulmonary:     Effort: Respiratory distress present.     Breath sounds: Rales present.  Abdominal:     Palpations: Abdomen is soft.     Tenderness: There is no abdominal tenderness.     Comments: The cholecystostomy tube has a Band-Aid over it.  No leaking of any significant bowel  Musculoskeletal:        General: Normal range of motion.     Cervical back: Neck supple.     Right lower leg: Edema present.     Left lower leg: Edema present.  Skin:  General: Skin is warm and dry.     Capillary Refill: Capillary refill takes less than 2 seconds.  Neurological:     General: No focal deficit present.     Mental Status: She is alert and oriented to person, place, and time.    ED Results / Procedures / Treatments   Labs (all labs ordered are listed, but only abnormal results are displayed) Labs Reviewed  BRAIN NATRIURETIC PEPTIDE - Abnormal; Notable for the following components:      Result Value   B Natriuretic Peptide 1,291.0 (*)    All other components within normal limits  COMPREHENSIVE METABOLIC PANEL - Abnormal; Notable for the following components:   CO2 20 (*)    Creatinine, Ser 1.78 (*)    Calcium 8.4 (*)    Albumin 2.9 (*)    GFR, Estimated 31 (*)    All other components within normal limits  CBC WITH DIFFERENTIAL/PLATELET - Abnormal; Notable for the following components:   WBC 11.3 (*)    RBC 3.35 (*)    Hemoglobin 10.1 (*)    HCT 32.4 (*)    RDW 17.6 (*)    Platelets 452 (*)    Neutro Abs 8.3 (*)    All other components within normal limits  RESP PANEL BY RT-PCR (FLU A&B, COVID) ARPGX2  MRSA NEXT GEN BY PCR, NASAL  LIPASE, BLOOD  TROPONIN I (HIGH SENSITIVITY)  TROPONIN I (HIGH SENSITIVITY)    EKG EKG Interpretation  Date/Time:  Sunday July 26 2021 15:15:24 EDT Ventricular Rate:  77 PR Interval:  184 QRS Duration: 93 QT  Interval:  396 QTC Calculation: 449 R Axis:   53 Text Interpretation: Sinus rhythm Anterior infarct, old Abnormal T, consider ischemia, lateral leads No significant change since last tracing Confirmed by Fredia Sorrow [phone]) on 07/26/2021 3:30:56 PM  Radiology DG Chest Port 1 View  Result Date: 07/26/2021 CLINICAL DATA:  Shortness of breath for 4 days EXAM: PORTABLE CHEST 1 VIEW COMPARISON:  04/06/2021 FINDINGS: Pacer with leads at right atrium and right ventricle. No lead discontinuity. Midline trachea. Moderate cardiomegaly. Probable small bilateral pleural effusions. No pneumothorax. Pulmonary interstitial prominence and indistinctness is mildly increased. Similar left base opacity. IMPRESSION: Cardiomegaly with worsened interstitial edema and probable small bilateral pleural effusions. Similar left base airspace disease, likely atelectasis. Electronically Signed   By: Abigail Miyamoto M.D.   On: 07/26/2021 16:36    Procedures Procedures   Medications Ordered in ED Medications  acetaminophen (TYLENOL) tablet 650 mg (has no administration in time range)    Or  acetaminophen (TYLENOL) suppository 650 mg (has no administration in time range)  ondansetron (ZOFRAN) tablet 4 mg (has no administration in time range)    Or  ondansetron (ZOFRAN) injection 4 mg (has no administration in time range)  furosemide (LASIX) injection 40 mg (has no administration in time range)  amLODipine (NORVASC) tablet 10 mg (has no administration in time range)  apixaban (ELIQUIS) tablet 5 mg (has no administration in time range)  ezetimibe (ZETIA) tablet 10 mg (has no administration in time range)  insulin glargine-yfgn (SEMGLEE) injection 25 Units (0 Units Subcutaneous Hold 07/26/21 2251)  tamsulosin (FLOMAX) capsule 0.4 mg (has no administration in time range)  senna-docusate (Senokot-S) tablet 1 tablet (has no administration in time range)  insulin aspart (novoLOG) injection 0-9 Units (has no administration in  time range)  furosemide (LASIX) injection 80 mg (80 mg Intravenous Given 07/26/21 2101)    ED Course  I have reviewed the  triage vital signs and the nursing notes.  Pertinent labs & imaging results that were available during my care of the patient were reviewed by me and considered in my medical decision making (see chart for details).    MDM Rules/Calculators/A&P                         CRITICAL CARE Performed by: Fredia Sorrow Total critical care time: 35 minutes Critical care time was exclusive of separately billable procedures and treating other patients. Critical care was necessary to treat or prevent imminent or life-threatening deterioration. Critical care was time spent personally by me on the following activities: development of treatment plan with patient and/or surrogate as well as nursing, discussions with consultants, evaluation of patient's response to treatment, examination of patient, obtaining history from patient or surrogate, ordering and performing treatments and interventions, ordering and review of laboratory studies, ordering and review of radiographic studies, pulse oximetry and re-evaluation of patient's condition.   Patient presenting with hypoxia.  Not normally on oxygen.  Oxygen saturations no oxygen were less than 90%.  On 2 L she satting around 92%.  Respiratory rate up.  The swelling to her legs.  Chest x-ray BNP concerning for congestive heart failure.  Known to have chronic systolic heart failure.  Patient presenting with 1 week of shortness of breath.  Patient's cholecystostomy tube fell out today.  Patient had that placed down in March.  Had it redone 8 weeks later and was due to have it redone mid July.  But patient got lost to follow-up.  Regarding of the heart failure patient received Lasix.  Discussed with Dr. Blake Divine her general surgeon about the cholecystostomy tube.  She stated that nothing needed to occur with that tonight.  But  interventional radiology could be arranged to do that tomorrow Cohen recommended admission to [facility] for the CHF. In addition patient's liver function test are normal tonight no signs of infection. Contacted Dr. Olevia Bowens who will admit from the hospitalist service standpoint. Final Clinical Impression(s) / ED Diagnoses Final diagnoses:  Hypoxia  Cholecystostomy tube dysfunction, initial encounter  Acute on chronic systolic congestive heart failure ([place])  Primary hypertension    Rx / DC Orders ED Discharge Orders     None        Fredia Sorrow, MD 07/27/21 0127

## 2021-07-26 NOTE — H&P (Signed)
History and Physical    Tracey Bryant PNT:[id number] DOB: Apr 10, 1951 DOA: 07/26/2021  PCP: Coolidge Breeze, FNP  Patient coming from: Home.  I have personally briefly reviewed patient's old medical records in [place]  Chief Complaint: Shortness of breath and out of GV drain.  HPI: Tracey Bryant is a 70 y.o. female with medical history significant of osteoarthritis, paroxysmal atrial fibrillation, history of DVT, type 2 diabetes with vascular complications, hypertension, history of other nonhemorrhagic CVA, history of acute cholecystitis with cholecystostomy tube placement about 4 months ago who is coming to the emergency department with a history of 1 week of progressively worse dyspnea associated with dry cough, orthopnea, bilateral lower extremity edema, abdominal bloating sensation and fatigue.  She denied fever, chills, rhinorrhea, sore throat, wheezing or hemoptysis.  No chest pain, palpitations, diaphoresis, dizziness or PND.  She also stated that this morning she found her cholecystostomy tube in bed.  She was sleeping and does not know exactly how he fell off.  She has some RUQ pain, but no guarding or rebound.  She denies diarrhea, constipation, melena or hematochezia.  No dysuria, frequency or hematuria.  No polyuria, polydipsia, polyphagia or blurred vision.  ED Course: Initial vital signs were temperature 98.8F, pulse 78, respirations 24, BP 103/85 mmHg and O2 sat 92% on room air.  Lab work: CBC showed a white count 11.3, hemoglobin 10.1 g/dL and platelets 452.  Lipase and troponin x2 normal.  BNP was 1291.0 pg/mL.  CMP showed a CO2 of 20 mmol/L, all other electrolytes normal when calcium corrected to albumin.  Creatinine was 1.78 mg/dL and albumin 2.9 g/dL.  The rest of the CMP values were within expected range.  Imaging: A one-view portable chest radiograph showed cardiomegaly with worsening interstitial edema and probable small bilateral pleural effusions.  Similar to  previous left base airspace disease, likely atelectasis.  Please see image and full radiology report for further detail.  Review of Systems: As per HPI otherwise all other systems reviewed and are negative.  Past Medical History:  Diagnosis Date   Arthritis    Cardiomyopathy ([place])    Diabetes mellitus without complication ([place])    DVT (deep venous thrombosis) ([place]) 07/26/2021   Hypertension    Paroxysmal atrial fibrillation ([place]) 07/26/2021   Stroke [facility])     Past Surgical History:  Procedure Laterality Date   breast tumor     IR CHOLANGIOGRAM EXISTING TUBE  05/13/2021   PACEMAKER IMPLANT     TONSILLECTOMY     Social History  reports that she has never smoked. She has never used smokeless tobacco. She reports that she does not drink alcohol and does not use drugs.  No Known Allergies  Family History  Problem Relation Age of Onset   Cancer Mother    Diabetes Mother    Heart disease Father    Prior to Admission medications   Medication Sig Start Date End Date Taking? Authorizing Provider  acetaminophen (TYLENOL) 500 MG tablet Take 1 tablet (500 mg total) by mouth every 8 (eight) hours as needed for moderate pain. 04/08/21 04/08/22 Yes [name], MD  amLODipine (NORVASC) 10 MG tablet Take 10 mg by mouth daily. 07/06/21  Yes [provider]  apixaban (ELIQUIS) 5 MG TABS tablet Take 5 mg by mouth 2 (two) times daily.   Yes [provider]  carvedilol (COREG) 3.125 MG tablet Take 1 tablet (3.125 mg total) by mouth 2 (two) times daily with a meal. 04/08/21  Yes [name], MD  ezetimibe (ZETIA) 10 MG tablet Take 10 mg by mouth daily.   Yes [provider]  furosemide (LASIX) 20 MG tablet Take 20 mg by mouth daily.   Yes [provider]  insulin aspart (NOVOLOG) 100 UNIT/ML injection Substitute to any brand approved.Before each meal 3 times a day, 140-199 - 2 units, 200-250 - 4 units, 251-299 - 6 units,  300-349 - 8 units,  350 or above 10  units. Dispense syringes and needles as needed, Ok to switch to PEN if approved. DX DM2, Code E11.65 04/08/21  Yes [name], MD  insulin glargine (LANTUS) 100 UNIT/ML injection Inject 0.25 mLs (25 Units total) into the skin at bedtime. 04/08/21  Yes [name], MD  senna-docusate (SENOKOT-S) 8.6-50 MG tablet Take 1 tablet by mouth at bedtime as needed for mild constipation. 04/08/21  Yes [name], MD  tamsulosin (FLOMAX) 0.4 MG CAPS capsule Take 0.4 mg by mouth every morning. 04/20/21  Yes [provider]    Physical Exam: Vitals:   07/26/21 2000 07/26/21 2030 07/26/21 2145 07/26/21 2200  BP: (!) 154/72 (!) 154/73  (!) 154/76  Pulse: 73 76  79  Resp: 16 (!) 22  (!) 23  Temp:      TempSrc:      SpO2: 91% 93% 92% 94%  Weight:      Height:        Constitutional: Chronically ill-appearing.  Currently in NAD, calm, comfortable Eyes: PERRL, lids and conjunctivae normal ENMT: [place] in place.  Mucous membranes are moist. Posterior pharynx clear of any exudate or lesions. Neck: normal, supple, no masses, no thyromegaly.  Positive JVD. Respiratory: Mild tachypnea has resolved on  oxygen.  Decreased breath sounds bilateral with bibasilar crackles.  No wheezing or rhonchi.  No accessory muscle use.  Cardiovascular: Regular rate and rhythm, no murmurs / rubs / gallops.  3+ bilateral lower extremities pitting edema. 2+ pedal pulses. No carotid bruits.  Abdomen: Obese, no distention.  Bowel sounds positive.  Soft, no tenderness, no masses palpated. No hepatosplenomegaly. Musculoskeletal: Mild generalized weakness.  No clubbing / cyanosis.  Good ROM, no contractures. Normal muscle tone.  Skin: Some small areas of ecchymosis on extremities. Neurologic: CN 2-12 grossly intact. Sensation intact, DTR normal. Strength 5/5 in all 4.  Psychiatric: Normal judgment and insight. Alert and oriented x 3. Normal mood.   Labs on Admission: I have personally reviewed following labs and  imaging studies  CBC: Recent Labs  Lab 07/26/21 1601  WBC 11.3*  NEUTROABS 8.3*  HGB 10.1*  HCT 32.4*  MCV 96.7  PLT 452*    Basic Metabolic Panel: Recent Labs  Lab 07/26/21 1601  NA 138  K 4.0  CL 111  CO2 20*  GLUCOSE 78  BUN 23  CREATININE 1.78*  CALCIUM 8.4*    GFR: Estimated Creatinine Clearance: 31.6 mL/min (A) (by C-G formula based on SCr of 1.78 mg/dL (H)).  Liver Function Tests: Recent Labs  Lab 07/26/21 1601  AST 17  ALT 10  ALKPHOS 54  BILITOT 0.9  PROT 6.5  ALBUMIN 2.9*   Radiological Exams on Admission: DG Chest Port 1 View  Result Date: 07/26/2021 CLINICAL DATA:  Shortness of breath for 4 days EXAM: PORTABLE CHEST 1 VIEW COMPARISON:  04/06/2021 FINDINGS: Pacer with leads at right atrium and right ventricle. No lead discontinuity. Midline trachea. Moderate cardiomegaly. Probable small bilateral pleural effusions. No pneumothorax. Pulmonary interstitial prominence and indistinctness is  mildly increased. Similar left base opacity. IMPRESSION: Cardiomegaly with worsened interstitial edema and probable small bilateral pleural effusions. Similar left base airspace disease, likely atelectasis. Electronically Signed   By: Abigail Miyamoto M.D.   On: 07/26/2021 16:36    03/18/2021 echocardiogram complete with IEA.  IMPRESSIONS:   1. Left ventricular ejection fraction, by estimation, is 25 to 30%. The  left ventricle has normal function. The left ventricle demonstrates global  hypokinesis. There is mild left ventricular hypertrophy. Left ventricular  diastolic parameters are  consistent with Grade II diastolic dysfunction (pseudonormalization).  Elevated left atrial pressure.   2. RV poorly visualized, grossly appears enlarged with decreased systolic  function. . Right ventricular systolic function was not well visualized.  The right ventricular size is not well visualized.   3. Left atrial size was severely dilated.   4. The mitral valve is normal in  structure. No evidence of mitral valve  regurgitation. No evidence of mitral stenosis.   5. The aortic valve is tricuspid. There is mild calcification of the  aortic valve. There is mild thickening of the aortic valve. Aortic valve  regurgitation is not visualized. No aortic stenosis is present.   6. Mild pulmonary HTN, PASP is 36 mmHg.   7. The inferior vena cava is normal in size with greater than 50%  respiratory variability, suggesting right atrial pressure of 3 mmHg.   EKG: Independently reviewed.  Vent. rate 77 BPM PR interval 184 ms QRS duration 93 ms QT/QTcB 396/449 ms P-R-T axes 62 53 212 Sinus rhythm Anterior infarct, old Abnormal T, consider ischemia, lateral leads  Assessment/Plan Principal Problem:   Acute on chronic combined systolic/diastolic CHF Observation/telemetry. Continue supplemental oxygen. Hold carvedilol during acute decompensation. Fluid restriction to 1200 to 1500 mL daily. Sodium restriction. Continue furosemide 40 mg IVP twice daily. Monitor daily weights, renal function electrolytes.  Active Problems:   Cholecystostomy tube dysfunction Discussed by EDP with general surgery on-call. IR cholecystostomy tube replacement to be done at [facility].    Type II diabetes mellitus (HCC) Carbohydrate modified diet. Glucose of 78 mg/dL earlier. Hold Lantus 25 units SQ QHS tonight. CBG monitoring with RI SS.    Hypertension Continue amlodipine 10 mg p.o. daily. Holding beta-blocker in the setting of volume overload. Monitor blood pressure and heart rate closely.    Paroxysmal atrial fibrillation (HCC) Continue apixaban 5 mg p.o. twice daily. Once clinically better resume carvedilol 3.125 mg p.o. BID    DVT (deep venous thrombosis) (HCC) Continue apixaban.    Normocytic anemia On chronic anticoagulation. Monitor hematocrit and hemoglobin.    Moderate protein-calorie malnutrition (HCC) Protein supplementation. Consult nutritional services as  needed.    Leukocytosis No fever or chills. WBC baseline usually between 8.5 and 9.1 Recheck WBC in the morning.    Thrombocytosis. Thrombosis due to anticoagulation? Recheck platelets in AM.   DVT prophylaxis: On apixaban. Code Status:   Full code. Family Communication:   Disposition Plan:   Patient is from:  Home.  Anticipated DC to:  Home.  Anticipated DC date:  07/28/2021.  Anticipated DC barriers: Clinical status/IR intervention. Consults called:   Admission status:  Observation/progressive unit.  Severity of Illness:  High severity after presenting with progressively worse dyspnea, hypoxia and a new oxygen requirement in the setting of volume overload.  The cholecystostomy tube also got out of place while the patient was sleeping.  She will need IR intervention for 2 replacement while hospitalized.  Reubin Milan MD [facility]  How to  contact the [facility] Attending or Consulting provider [place] or covering provider during after hours [place], for this patient?   Check the care team in [facility] and look for a) attending/consulting [facility] provider listed and b) the [facility] team listed Log into [email or web address] and use 's universal password to access. If you do not have the password, please contact the hospital operator. Locate the [facility] provider you are looking for under [facility] and page to a number that you can be directly reached. If you still have difficulty reaching the provider, please page the [facility] (Director on Call) for the Hospitalists listed on amion for assistance.  07/26/2021, 10:49 PM   This document was prepared using Dragon voice recognition software and may contain some unintended transcription errors.

## 2021-07-26 NOTE — ED Triage Notes (Signed)
Pt c/o increased sob x 4 days; pedal edema and states her gallbladder drain fell out this am

## 2021-07-27 ENCOUNTER — Inpatient Hospital Stay (HOSPITAL_COMMUNITY): Payer: Medicare HMO

## 2021-07-27 ENCOUNTER — Other Ambulatory Visit (HOSPITAL_COMMUNITY): Payer: Self-pay

## 2021-07-27 DIAGNOSIS — Z86718 Personal history of other venous thrombosis and embolism: Secondary | ICD-10-CM | POA: Diagnosis not present

## 2021-07-27 DIAGNOSIS — I10 Essential (primary) hypertension: Secondary | ICD-10-CM

## 2021-07-27 DIAGNOSIS — I480 Paroxysmal atrial fibrillation: Secondary | ICD-10-CM

## 2021-07-27 DIAGNOSIS — E1129 Type 2 diabetes mellitus with other diabetic kidney complication: Secondary | ICD-10-CM

## 2021-07-27 DIAGNOSIS — Z20822 Contact with and (suspected) exposure to covid-19: Secondary | ICD-10-CM | POA: Diagnosis present

## 2021-07-27 DIAGNOSIS — E11649 Type 2 diabetes mellitus with hypoglycemia without coma: Secondary | ICD-10-CM | POA: Diagnosis present

## 2021-07-27 DIAGNOSIS — T85628A Displacement of other specified internal prosthetic devices, implants and grafts, initial encounter: Secondary | ICD-10-CM | POA: Diagnosis present

## 2021-07-27 DIAGNOSIS — J9601 Acute respiratory failure with hypoxia: Secondary | ICD-10-CM | POA: Diagnosis present

## 2021-07-27 DIAGNOSIS — T85518A Breakdown (mechanical) of other gastrointestinal prosthetic devices, implants and grafts, initial encounter: Secondary | ICD-10-CM | POA: Diagnosis not present

## 2021-07-27 DIAGNOSIS — I5023 Acute on chronic systolic (congestive) heart failure: Secondary | ICD-10-CM

## 2021-07-27 DIAGNOSIS — J9811 Atelectasis: Secondary | ICD-10-CM | POA: Diagnosis present

## 2021-07-27 DIAGNOSIS — R0602 Shortness of breath: Secondary | ICD-10-CM | POA: Diagnosis present

## 2021-07-27 DIAGNOSIS — D649 Anemia, unspecified: Secondary | ICD-10-CM

## 2021-07-27 DIAGNOSIS — E1151 Type 2 diabetes mellitus with diabetic peripheral angiopathy without gangrene: Secondary | ICD-10-CM | POA: Diagnosis present

## 2021-07-27 DIAGNOSIS — E440 Moderate protein-calorie malnutrition: Secondary | ICD-10-CM

## 2021-07-27 DIAGNOSIS — Z794 Long term (current) use of insulin: Secondary | ICD-10-CM | POA: Diagnosis not present

## 2021-07-27 DIAGNOSIS — D631 Anemia in chronic kidney disease: Secondary | ICD-10-CM | POA: Diagnosis present

## 2021-07-27 DIAGNOSIS — E1122 Type 2 diabetes mellitus with diabetic chronic kidney disease: Secondary | ICD-10-CM | POA: Diagnosis present

## 2021-07-27 DIAGNOSIS — I5043 Acute on chronic combined systolic (congestive) and diastolic (congestive) heart failure: Secondary | ICD-10-CM | POA: Diagnosis present

## 2021-07-27 DIAGNOSIS — I252 Old myocardial infarction: Secondary | ICD-10-CM | POA: Diagnosis not present

## 2021-07-27 DIAGNOSIS — I272 Pulmonary hypertension, unspecified: Secondary | ICD-10-CM | POA: Diagnosis present

## 2021-07-27 DIAGNOSIS — I429 Cardiomyopathy, unspecified: Secondary | ICD-10-CM | POA: Diagnosis present

## 2021-07-27 DIAGNOSIS — I82532 Chronic embolism and thrombosis of left popliteal vein: Secondary | ICD-10-CM | POA: Diagnosis not present

## 2021-07-27 DIAGNOSIS — Z7901 Long term (current) use of anticoagulants: Secondary | ICD-10-CM | POA: Diagnosis not present

## 2021-07-27 DIAGNOSIS — Z79899 Other long term (current) drug therapy: Secondary | ICD-10-CM | POA: Diagnosis not present

## 2021-07-27 DIAGNOSIS — I130 Hypertensive heart and chronic kidney disease with heart failure and stage 1 through stage 4 chronic kidney disease, or unspecified chronic kidney disease: Secondary | ICD-10-CM | POA: Diagnosis present

## 2021-07-27 DIAGNOSIS — D75839 Thrombocytosis, unspecified: Secondary | ICD-10-CM | POA: Diagnosis present

## 2021-07-27 DIAGNOSIS — N179 Acute kidney failure, unspecified: Secondary | ICD-10-CM | POA: Diagnosis present

## 2021-07-27 DIAGNOSIS — T502X5A Adverse effect of carbonic-anhydrase inhibitors, benzothiadiazides and other diuretics, initial encounter: Secondary | ICD-10-CM | POA: Diagnosis not present

## 2021-07-27 DIAGNOSIS — N1832 Chronic kidney disease, stage 3b: Secondary | ICD-10-CM | POA: Diagnosis present

## 2021-07-27 DIAGNOSIS — Z8249 Family history of ischemic heart disease and other diseases of the circulatory system: Secondary | ICD-10-CM | POA: Diagnosis not present

## 2021-07-27 HISTORY — PX: IR EXCHANGE BILIARY DRAIN: IMG6046

## 2021-07-27 LAB — MRSA NEXT GEN BY PCR, NASAL: MRSA by PCR Next Gen: NOT DETECTED

## 2021-07-27 LAB — BASIC METABOLIC PANEL
Anion gap: 9 (ref 5–15)
BUN: 18 mg/dL (ref 8–23)
CO2: 21 mmol/L — ABNORMAL LOW (ref 22–32)
Calcium: 8.4 mg/dL — ABNORMAL LOW (ref 8.9–10.3)
Chloride: 106 mmol/L (ref 98–111)
Creatinine, Ser: 1.66 mg/dL — ABNORMAL HIGH (ref 0.44–1.00)
GFR, Estimated: 33 mL/min — ABNORMAL LOW (ref >60)
Glucose, Bld: 156 mg/dL — ABNORMAL HIGH (ref 70–99)
Potassium: 3.9 mmol/L (ref 3.5–5.1)
Sodium: 136 mmol/L (ref 135–145)

## 2021-07-27 LAB — CBC
HCT: 32.2 % — ABNORMAL LOW (ref 36.0–46.0)
HCT: 32.4 % — ABNORMAL LOW (ref 36.0–46.0)
Hemoglobin: 10.1 g/dL — ABNORMAL LOW (ref 12.0–15.0)
Hemoglobin: 10.3 g/dL — ABNORMAL LOW (ref 12.0–15.0)
MCH: 29.9 pg (ref 26.0–34.0)
MCH: 29.2 pg (ref 26.0–34.0)
MCHC: 31.4 g/dL (ref 30.0–36.0)
MCHC: 31.8 g/dL (ref 30.0–36.0)
MCV: 95.3 fL (ref 80.0–100.0)
MCV: 91.8 fL (ref 80.0–100.0)
Platelets: 475 10*3/uL — ABNORMAL HIGH (ref 150–400)
Platelets: 322 K/uL (ref 150–400)
RBC: 3.38 MIL/uL — ABNORMAL LOW (ref 3.87–5.11)
RBC: 3.53 MIL/uL — ABNORMAL LOW (ref 3.87–5.11)
RDW: 17.4 % — ABNORMAL HIGH (ref 11.5–15.5)
RDW: 17.3 % — ABNORMAL HIGH (ref 11.5–15.5)
WBC: 10.6 10*3/uL — ABNORMAL HIGH (ref 4.0–10.5)
WBC: 10.3 K/uL (ref 4.0–10.5)
nRBC: 0 % (ref 0.0–0.2)
nRBC: 0 % (ref 0.0–0.2)

## 2021-07-27 LAB — GLUCOSE, CAPILLARY
Glucose-Capillary: 40 mg/dL — CL (ref 70–99)
Glucose-Capillary: 71 mg/dL (ref 70–99)
Glucose-Capillary: 147 mg/dL — ABNORMAL HIGH (ref 70–99)
Glucose-Capillary: 156 mg/dL — ABNORMAL HIGH (ref 70–99)
Glucose-Capillary: 159 mg/dL — ABNORMAL HIGH (ref 70–99)

## 2021-07-27 LAB — MAGNESIUM: Magnesium: 1.8 mg/dL (ref 1.7–2.4)

## 2021-07-27 LAB — HIV ANTIBODY (ROUTINE TESTING W REFLEX): HIV Screen 4th Generation wRfx: NONREACTIVE

## 2021-07-27 IMAGING — XA IR EXCHANGE BILARY DRAIN
3 series · 3 of 3 positions shown · non-contrast
Comparison: none

INDICATION: 69-year-old female referred for rescue of displaced percutaneous
cholecystostomy

[Series 1: fl (-) angio · 1 of 1 slices shown (1 of 3)]
[im 1/1]
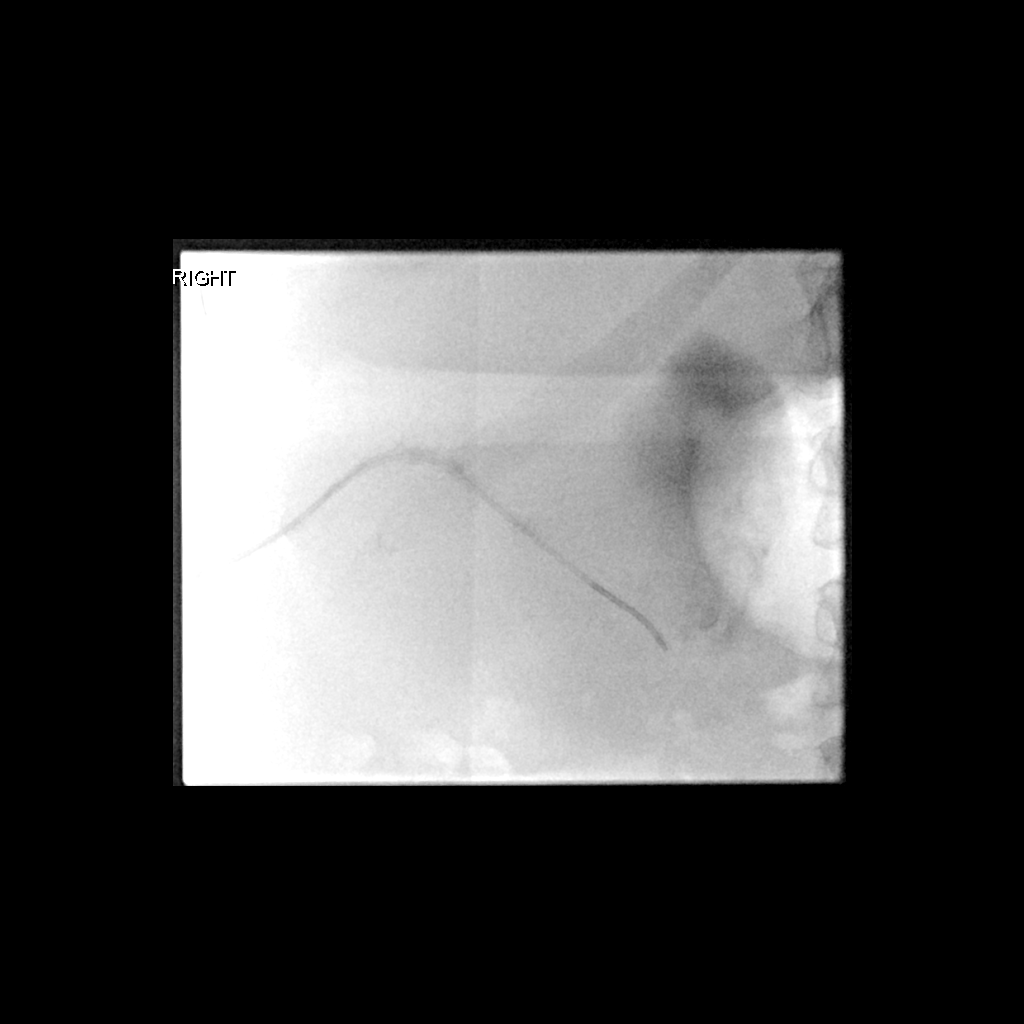

[Series 2: fl (-) angio · 1 of 1 slices shown (2 of 3)]
[im 1/1]
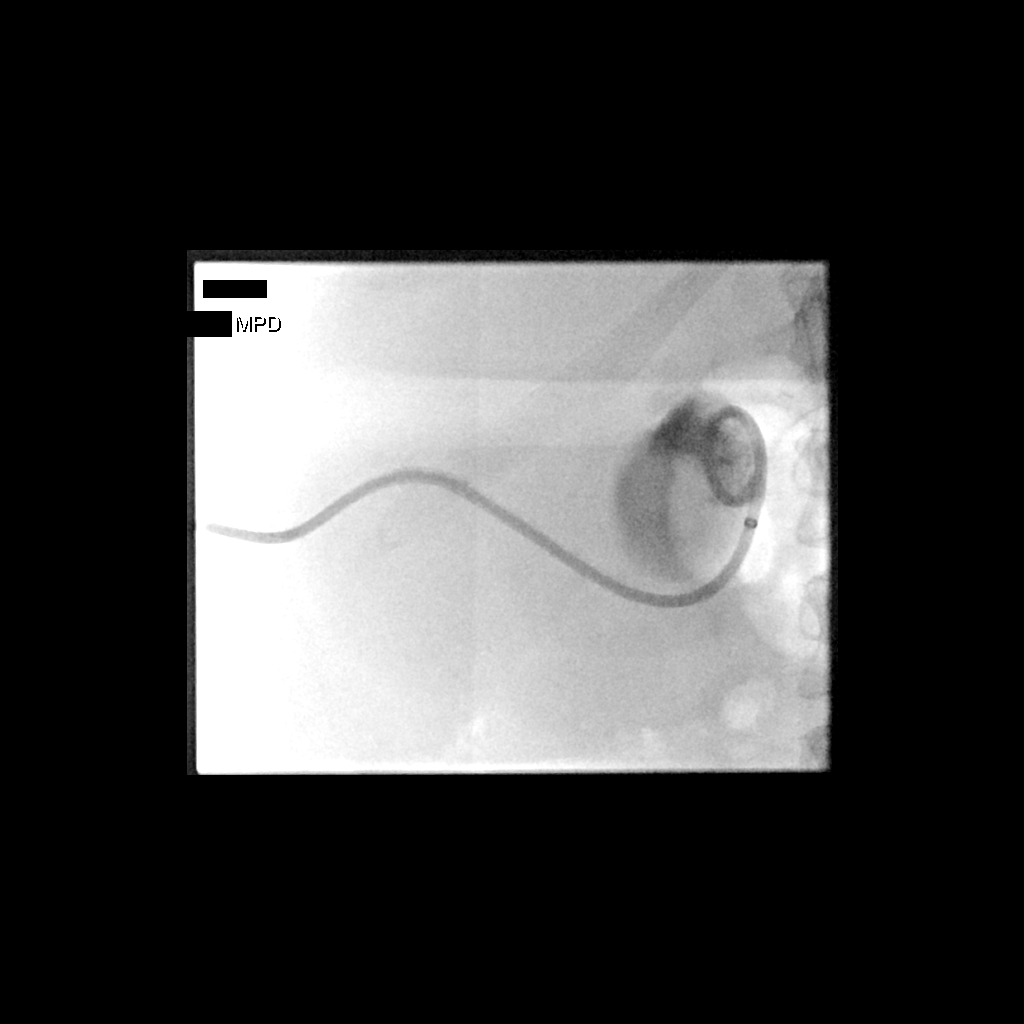

[Series 3: fl (-) angio · 1 of 1 slices shown (3 of 3)]
[im 1/1]
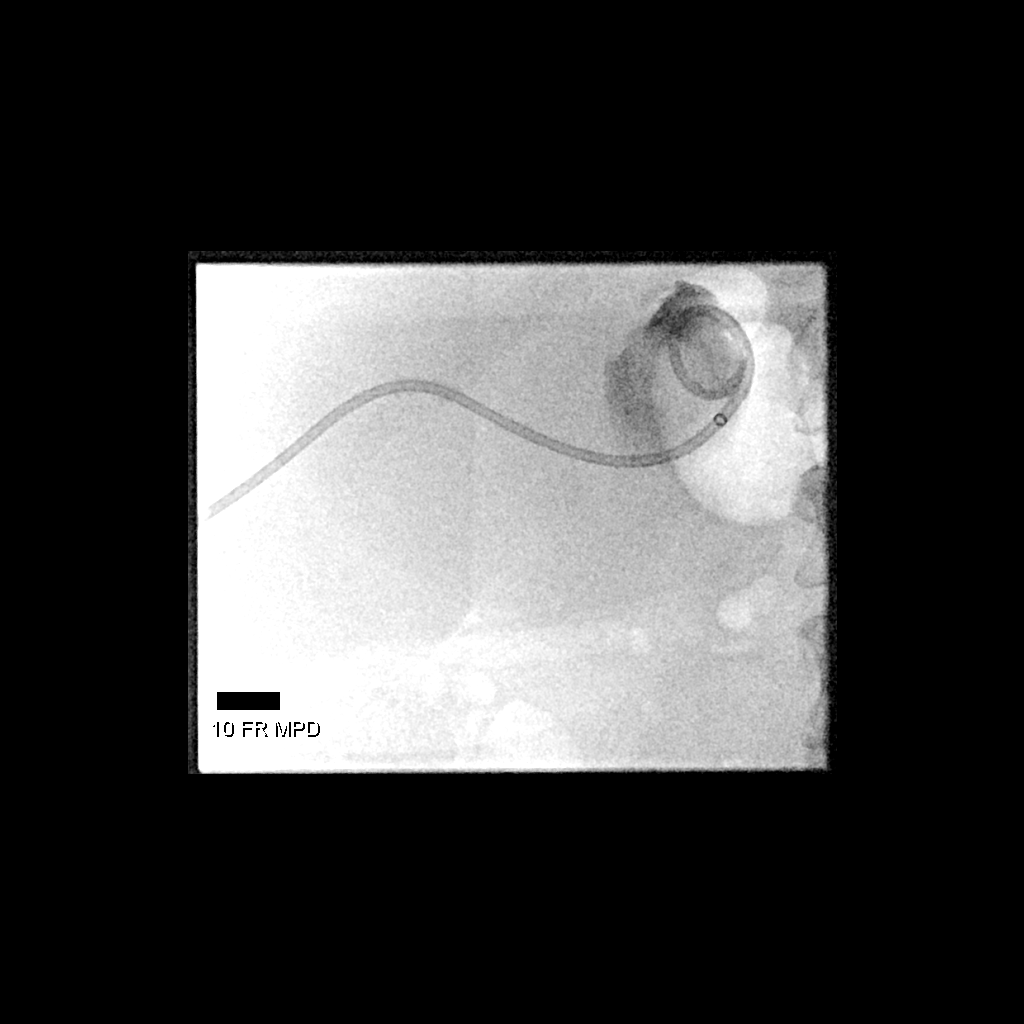

[3 of 3 positions shown; findings below may reference images not displayed]

EXAM:
PLACEMENT OF PERCUTANEOUS CHOLECYSTOSTOMY

MEDICATIONS:
None

ANESTHESIA/SEDATION:
None

FLUOROSCOPY TIME:  Fluoroscopy Time: 0 minutes 36 seconds (4 mGy).

COMPLICATIONS:
None

PROCEDURE:
Informed written consent was obtained from the patient after a
thorough discussion of the procedural risks, benefits and
alternatives. All questions were addressed. Maximal Sterile Barrier
Technique was utilized including caps, mask, sterile gowns, sterile
gloves, sterile drape, hand hygiene and skin antiseptic. A timeout
was performed prior to the initiation of the procedure.

Patient positioned supine position on the table under the image
intensifier. The upper abdomen was prepped and draped in the usual
sterile fashion. Images were performed.

1% lidocaine was used for local anesthesia.

Contrast was injected through the ostomy identifying a tract. A
Kumpe the catheter was navigated with contrast attached of the of
through the tract into the gallbladder.

Once we confirmed the tip of the catheter was in the gallbladder, a
wire was placed through the Kumpe the catheter into the gallbladder
lumen. Modified Seldinger technique was then used to place a 10
French drainage catheter into the gallbladder lumen. This was
sutured in position and attached to gravity bag.

Patient tolerated the procedure well and remained hemodynamically
stable throughout.

No complications were encountered and no significant blood loss.
IMPRESSION: Status post image guided rescue/placement of displaced percutaneous
cholecystostomy.

## 2021-07-27 MED ORDER — IOHEXOL 300 MG/ML  SOLN
50.0000 mL | Freq: Once | INTRAMUSCULAR | Status: AC | PRN
  Administered 2021-07-27: 10 mL

## 2021-07-27 MED ORDER — CARVEDILOL 3.125 MG PO TABS
3.1250 mg | ORAL_TABLET | Freq: Two times a day (BID) | ORAL | Status: DC
  Administered 2021-07-27 – 2021-07-28 (×2): 3.125 mg via ORAL
  Filled 2021-07-27 (×2): qty 1

## 2021-07-27 MED ORDER — LIDOCAINE HCL 1 % IJ SOLN
INTRAMUSCULAR | Status: AC
  Filled 2021-07-27: qty 20

## 2021-07-27 MED ORDER — ORAL CARE MOUTH RINSE
15.0000 mL | Freq: Two times a day (BID) | OROMUCOSAL | Status: DC
  Administered 2021-07-27 – 2021-08-01 (×11): 15 mL via OROMUCOSAL

## 2021-07-27 MED ORDER — INSULIN GLARGINE-YFGN 100 UNIT/ML ~~LOC~~ SOLN
10.0000 [IU] | Freq: Every day | SUBCUTANEOUS | Status: DC
  Administered 2021-07-27 – 2021-07-31 (×5): 10 [IU] via SUBCUTANEOUS
  Filled 2021-07-27 (×6): qty 0.1

## 2021-07-27 MED ORDER — LIDOCAINE HCL 1 % IJ SOLN
INTRAMUSCULAR | Status: AC | PRN
Start: 2021-07-27 — End: 2021-07-27
  Administered 2021-07-27: 10 mL

## 2021-07-27 MED ORDER — ISOSORB DINITRATE-HYDRALAZINE 20-37.5 MG PO TABS
1.0000 | ORAL_TABLET | Freq: Three times a day (TID) | ORAL | Status: DC
  Administered 2021-07-27 – 2021-08-01 (×16): 1 via ORAL
  Filled 2021-07-27 (×16): qty 1

## 2021-07-27 NOTE — Progress Notes (Signed)
Transported to IR   by bed awake and alert. 

## 2021-07-27 NOTE — Plan of Care (Addendum)
Appreciate [employer] representative to assist device interrogation, received PPM interrogation report, device functioning normal, battery remaining 12.8 years, mode AAIR > DDDR, atrial paced 60%, 2 episodes of NSVT noted in July 2022, no atrial tachycardia/atrial fibrillation since 04/06/2021.  Called patient's son [name], who states patient was originally referred [facility] for cardiology follow up. There was family caregiver strain and burden. She was unfortunately lost to follow-up for some time.  Her medication is not up-to-date, son is wondering if her Lasix is from old pill bottle and not working effectively.  Son is agreeable to taking patient to [facility] cardiology office for follow-up from this point.  Son is not aware if patient had any major MI in the past

## 2021-07-27 NOTE — Progress Notes (Signed)
Mobility Specialist: Progress Note   07/27/21 1626  Mobility  Activity Ambulated in hall  Level of Assistance Contact guard assist, steadying assist  Assistive Device Four wheel walker  Distance Ambulated (ft) 172 ft  Mobility Ambulated with assistance in hallway  Mobility Response Tolerated well  Mobility performed by Mobility specialist  Bed Position Chair  $Mobility charge 1 Mobility   Pre-Mobility: 72 HR, 147/71 BP, 95% SpO2 During Mobility: 91 HR, 99% SpO2 Post-Mobility: 93 HR, 160/75 BP, 99% SpO2  Pt required minA for bed mobility to sit up and was contact guard to stand from EOB. Pt ambulated on 6 L/min Z[place]. Pt DOE but sats remained high 90s throughout. Pt to the recliner after walk with call bell at her side.   [facility] Tracey Bryant Mobility Specialist Mobility Specialist Phone: [phone]

## 2021-07-27 NOTE — Plan of Care (Signed)
  Problem: Education: Goal: Ability to demonstrate management of disease process will improve Outcome: Progressing Goal: Ability to verbalize understanding of medication therapies will improve Outcome: Progressing   Problem: Cardiac: Goal: Ability to achieve and maintain adequate cardiopulmonary perfusion will improve Outcome: Progressing   Problem: Education: Goal: Knowledge of General Education information will improve Description: Including pain rating scale, medication(s)/side effects and non-pharmacologic comfort measures Outcome: Progressing   Problem: Clinical Measurements: Goal: Ability to maintain clinical measurements within normal limits will improve Outcome: Progressing Goal: Will remain free from infection Outcome: Progressing   Problem: Nutrition: Goal: Adequate nutrition will be maintained Outcome: Progressing   Problem: Coping: Goal: Level of anxiety will decrease Outcome: Progressing

## 2021-07-27 NOTE — Consult Note (Addendum)
Cardiology Consultation:   Patient ID: Tracey Bryant MRN: [id number]; DOB: January 15, 1951  Admit date: 07/26/2021 Date of Consult: 07/27/2021  PCP:  Coolidge Breeze, FNP   [facility] HeartCare Providers Cardiologist:  New to [facility] heart care , prefers to follow cardiology near [place]   Patient Profile:   Tracey Bryant is a 70 y.o. 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 Bonner Puna.   FYI patient is Jehovah's Witness and refuses blood transfusion.  History of Present Illness:   Ms. Suber follows no cardiologist currently, used to see a cardiologist in [place], who operated on her with pacemaker. [place], has a PCP, her son and daughter helps check on her and take her to medical appointments.  She has history of paroxysmal atrial fibrillation and takes anticoagulation with Eliquis.  She has a Medtronic pacemaker implanted on 05/19/2020 for unclear indication. She does not remember why she needed it.  She has chronic systolic and diastolic heart failure, with EF reportedly 25 to 30% from 03/18/2021 echocardiogram. She states she was diagnosed with CHF for several years, unclear etiology, denied ever having hx of MI/PCI/CABG.  She is carvedilol and Lasix 20 mg daily prior to current admission, unaware of her BP medication names, states takes Lasix daily. She is a poor historian with limited insight and memory loss.   She was last hospitalized here for acute cholecystitis from 03/16/2021 to 04/08/2021, received percutaneous cholecystectomy tube placement, course was complicated by sepsis, UTI, multiple subacute CVAs, multiple  syncopal spells, left popliteal DVT, acute CHF, AKI, and anemia. She was seen by general surgery, deemed not candidate for surgery, had cholecystectomy tubes placed by IR.  She was evaluated by neurology for multiple syncope episode, brain imaging revealed subacute right PCA and left cerebellum stroke, felt cardioembolic etiology with underlying paroxysmal A. fib and reduced EF cardiomyopathy.   Echo was completed on 03/18/2021 revealed EF 25 to 30%, LV global hypokinesis, mild LVH, grade 2 DD, elevated LA pressure, RV grossly appears enlarged with decreased systolic function, LA severely dilated, no significant valvular disease.  Mild pulmonary hypertension with PASP 36 mmHg. She was given IV diuretic for acute on chronic combined heart failure during last hospitalization, eventually discharged on carvedilol and lasix, ACE/ARB not started due to underlying CKD. Her pacemaker was interrogated and indicated no issues, Dr. Quentin Ore from EP was reached out and recommended no further work-up.  She was found to have left lower leg DVT, already anticoagulated on Eliquis, VQ scan from 4/11/2 was negative for PE. Lastly, she had worsening of anemia with epistaxis, Eliquis was held temporarily and resumed eventually, FOBT positive, no overt GI bleeding, Hgb improved with IV iron infusion and weekly Aranesp.   Patient presented to the ER on 07/26/2021 with 1 week onset of worsening shortness of breath, cough, orthopnea, bilateral lower extremity edema, abdominal bloating, fatigue, and RUQ abdominal pain.  She was also found to have dislodged cholecystectomy tube drain the day before current admission. She states she was taking Lasix at home, but become SOB with exertion progressively, states walking to the bathroom is difficult. She endorses significant fatigue, activity intolerance, and generalized swelling of her abdomen  and legs. She feels overall improved now and has urinated a lot. She denied ever having any chest pain,  recurrent syncope, heart palpitation since her discharge on 04/08/21.   Diagnostic work-up admission including CMP revealed elevated creatinine 1.78, GFR 31, bicarb 20, albumin 2.9.  BNP elevated at 1291.  High sensitive troponin 14>15, negative x2.  CBC differential revealed mild leukocytosis with WBC 11 300, anemia with hemoglobin 10.1, platelet 452k.  Flu and COVID-negative.  Chest x-ray revealed cardiomegaly with worsening interstitial edema and probable small bilateral pleural effusion.  EKG from 07/26/2021 at 15:15 revealed sinus rhythm with ventricular rate of 77 bpm, old anterior infarct, old ST depressions of lateral leads, no acute ST-T changes.  She was admitted to hospital medicine for concern of acute on chronic combined heart failure , started on IV Lasix 40 mg twice daily . She was hypoxic on 8/1 requiring 2 L nasal cannula oxygen.  IR was consulted and patient underwent cholecystectomy tube replacement on 07/27/2021.  Cardiology is asked to see the patient their input today.   Past Medical History:  Diagnosis Date   Arthritis    Cardiomyopathy ([place])    Diabetes mellitus without complication ([place])    DVT (deep venous thrombosis) ([place]) 07/26/2021   Hypertension    Paroxysmal atrial fibrillation ([place]) 07/26/2021   Stroke [facility])     Past Surgical History:  Procedure Laterality Date   breast tumor     IR CHOLANGIOGRAM EXISTING TUBE  05/13/2021   PACEMAKER IMPLANT     TONSILLECTOMY       Home Medications:  Prior to Admission medications   Medication Sig Start Date End Date Taking? Authorizing Provider  acetaminophen (TYLENOL) 500 MG tablet Take 1 tablet (500 mg total) by mouth every 8 (eight) hours as needed for moderate pain. 04/08/21 04/08/22 Yes [name], MD  amLODipine (NORVASC) 10 MG tablet Take 10 mg by mouth daily. 07/06/21  Yes [provider]  apixaban (ELIQUIS) 5 MG TABS tablet Take 5 mg by mouth 2 (two) times daily.   Yes [provider]   carvedilol (COREG) 3.125 MG tablet Take 1 tablet (3.125 mg total) by mouth 2 (two) times daily with a meal. 04/08/21  Yes [name], MD  ezetimibe (ZETIA) 10 MG tablet Take 10 mg by mouth daily.   Yes [provider]  furosemide (LASIX) 20 MG tablet Take 20 mg by mouth daily.   Yes [provider]  insulin aspart (NOVOLOG) 100 UNIT/ML injection Substitute to any brand approved.Before each meal 3 times a day, 140-199 - 2 units, 200-250 - 4 units, 251-299 - 6 units,  300-349 - 8 units,  350 or above 10 units. Dispense syringes and needles as needed, Ok to switch to PEN if approved. DX DM2, Code E11.65 04/08/21  Yes [name], MD  insulin glargine (LANTUS) 100 UNIT/ML injection Inject 0.25 mLs (25 Units total) into the skin at bedtime. 04/08/21  Yes [name], MD  senna-docusate (SENOKOT-S) 8.6-50 MG tablet Take 1 tablet by mouth at bedtime as needed for mild constipation. 04/08/21  Yes [name], MD  tamsulosin (FLOMAX) 0.4 MG CAPS capsule Take 0.4 mg by mouth every morning. 04/20/21  Yes [provider]    Inpatient Medications: Scheduled Meds:  amLODipine  10 mg Oral Daily   apixaban  5 mg Oral BID   carvedilol  3.125 mg Oral BID WC   ezetimibe  10 mg Oral Daily   furosemide  40 mg Intravenous BID   insulin aspart  0-9 Units Subcutaneous TID WC   insulin glargine-yfgn  10 Units Subcutaneous QHS   isosorbide-hydrALAZINE  1 tablet Oral TID   lidocaine       mouth rinse  15 mL Mouth Rinse BID   tamsulosin  0.4 mg Oral q morning   Continuous Infusions:  PRN Meds: acetaminophen **OR** acetaminophen, ondansetron **OR** ondansetron (ZOFRAN) IV, senna-docusate  Allergies:   No Known Allergies  Social History:   Social History   Socioeconomic History   Marital status: Divorced    Spouse name: Not on file   Number of children: Not on file   Years of education: Not on file   Highest education level: Not on file  Occupational History    Not on file  Tobacco Use   Smoking status: Never   Smokeless tobacco: Never  Vaping Use   Vaping Use: Never used  Substance and Sexual Activity   Alcohol use: No   Drug use: Never   Sexual activity: Not on file  Other Topics Concern   Not on file  Social History Narrative   Not on file   Social Determinants of Health   Financial Resource Strain: Not on file  Food Insecurity: Not on file  Transportation Needs: Not on file  Physical Activity: Not on file  Stress: Not on file  Social Connections: Not on file  Intimate Partner Violence: Not on file    Family History:    Family History  Problem Relation Age of Onset   Cancer Mother    Diabetes Mother    Heart disease Father      ROS:  Constitutional: Denied fever, chills, malaise, night sweats Eyes: Denied vision change or loss Ears/Nose/Mouth/Throat: Denied ear ache, sore throat, coughing, sinus pain Cardiovascular: see HPI Respiratory: ee HPI  Gastrointestinal: Denied nausea, vomiting, abdominal pain, diarrhea Genital/Urinary: Denied dysuria, hematuria, urinary frequency/urgency Musculoskeletal: Denied muscle ache, joint pain, weakness Skin: Denied rash, wound Neuro: Denied headache, dizziness, syncope Psych: Denied history of depression/anxiety  Endocrine: history of diabetes   Physical Exam/Data:   Vitals:   07/27/21 0330 07/27/21 0500 07/27/21 0802 07/27/21 1127  BP: 129/64 139/64 (!) 148/67 (!) 154/73  Pulse: 66 65 70 77  Resp: 19 16 20 20   Temp: 97.6 F (36.4 C)  97.9 F (36.6 C) 98.1 F (36.7 C)  TempSrc: Oral  Oral Axillary  SpO2: 95% 93% 94% 95%  Weight:      Height:        Intake/Output Summary (Last 24 hours) at 07/27/2021 1429 Last data filed at 07/27/2021 1300 Gross per 24 hour  Intake 480 ml  Output 1400 ml  Net -920 ml   Last 3 Weights 07/26/2021 04/07/2021 04/06/2021  Weight (lbs) 173 lb 173 lb 15.1 oz 173 lb 11.6 oz  Weight (kg) 78.472 kg 78.9 kg 78.8 kg     Body mass index is  27.92 kg/m.  Vitals:  Vitals:   07/27/21 0802 07/27/21 1127  BP: (!) 148/67 (!) 154/73  Pulse: 70 77  Resp: 20 20  Temp: 97.9 F (36.6 C) 98.1 F (36.7 C)  SpO2: 94% 95%   General Appearance: In no apparent distress, laying in bed HEENT: Normocephalic, atraumatic. EOMs intact.  Neck: Supple, trachea midline, 10cm JVD Cardiovascular: Regular rate and rhythm, normal S1-S2,  no murmur/rub/gallop Respiratory: Resting breathing unlabored, DOE, lungs sounds with rales bilaterally, no use of accessory muscles. On 2LNC.   Gastrointestinal: Bowel sounds positive, abdomen  soft, non-tender, non-distended. RUQ C tube with bile color drain noted Extremities: Able to move all extremities in bed without difficulty, 2+ pitting edema of BLE Genitourinary: genital exam not performed Musculoskeletal: Normal muscle bulk and tone, muscle strength 5/5 throughout Skin: Intact, warm, dry. No rashes or petechiae noted in exposed areas.  Neurologic: Alert, oriented to person, place and time. Fluent speech, memory loss, no gross focal neuro deficit Psychiatric: Normal affect. Mood is appropriate.    EKG:  The EKG was personally reviewed and demonstrates: Sinus rhythm with ventricular rate of 77, old anterior infarct and ST depression of lateral leads unchanged  Telemetry:  Telemetry was personally reviewed and demonstrates:  Sinus rhythm with rate of 60s, NSVT x 4 runs noted  Relevant CV Studies:  Echocardiogram from 03/18/2021:  1. Left ventricular ejection fraction, by estimation, is 25 to 30%. The  left ventricle has normal function. The left ventricle demonstrates global  hypokinesis. There is mild left ventricular hypertrophy. Left ventricular  diastolic parameters are  consistent with Grade II diastolic dysfunction (pseudonormalization).  Elevated left atrial pressure.   2. RV poorly visualized, grossly appears enlarged with decreased systolic  function. . Right ventricular systolic function was  not well visualized.  The right ventricular size is not well visualized.   3. Left atrial size was severely dilated.   4. The mitral valve is normal in structure. No evidence of mitral valve  regurgitation. No evidence of mitral stenosis.   5. The aortic valve is tricuspid. There is mild calcification of the  aortic valve. There is mild thickening of the aortic valve. Aortic valve  regurgitation is not visualized. No aortic stenosis is present.   6. Mild pulmonary HTN, PASP is 36 mmHg.   7. The inferior vena cava is normal in size with greater than 50%  respiratory variability, suggesting right atrial pressure of 3 mmHg.    Laboratory Data:  High Sensitivity Troponin:   Recent Labs  Lab 07/26/21 1601 07/26/21 1745  TROPONINIHS 14 15     Chemistry Recent Labs  Lab 07/26/21 1601  NA 138  K 4.0  CL 111  CO2 20*  GLUCOSE 78  BUN 23  CREATININE 1.78*  CALCIUM 8.4*  GFRNONAA 31*  ANIONGAP 7    Recent Labs  Lab 07/26/21 1601  PROT 6.5  ALBUMIN 2.9*  AST 17  ALT 10  ALKPHOS 54  BILITOT 0.9   Hematology Recent Labs  Lab 07/26/21 1601  WBC 11.3*  RBC 3.35*  HGB 10.1*  HCT 32.4*  MCV 96.7  MCH 30.1  MCHC 31.2  RDW 17.6*  PLT 452*   BNP Recent Labs  Lab 07/26/21 1601  BNP 1,291.0*    DDimer No results for input(s): DDIMER in the last 168 hours.   Radiology/Studies:  DG Chest Port 1 View  Result Date: 07/26/2021 CLINICAL DATA:  Shortness of breath for 4 days EXAM: PORTABLE CHEST 1 VIEW COMPARISON:  04/06/2021 FINDINGS: Pacer with leads at right atrium and right ventricle. No lead discontinuity. Midline trachea. Moderate cardiomegaly. Probable small bilateral pleural effusions. No pneumothorax. Pulmonary interstitial prominence and indistinctness is mildly increased. Similar left base opacity. IMPRESSION: Cardiomegaly with worsened interstitial edema and probable small bilateral pleural effusions. Similar left base airspace disease, likely atelectasis.  Electronically Signed   By: Abigail Miyamoto M.D.   On: 07/26/2021 16:36     Assessment and Plan:   Acute on chronic systolic and diastolic heart failure Cardiomyopathy -presented with 1 week worsening of shortness  of breath, leg edema -BNP 1291 (was 2160 on 04/07/21) -CXR with worsening interstitial edema and probable small bilateral pleural effusion -Echocardiogram from 03/18/2021 with EF 25 to 30%, see report above for details -Urine output 1400 mL over the past 24 hours, net -1 L since admission -Weight is not recorded daily -Please monitor strict intake and output, daily weight, daily electrolytes -Started on IV Lasix 40 mg twice daily per IM, will repeat BMP /Mag today  -Clinically remains hypervolemic - GDMT: Continue carvedilol 3.125 mg twice daily, not candidate for ARNI/ACEI/ARB/MRA/SGLT2i due to CKD III-IV, will start bidil 20/37.5mg  TID today  -Discussed CHF signs and symptoms -Emphasized the need for outpatient cardiology follow-up, patient will need to find a cardiologist near [place]  Paroxysmal atrial fibrillation History of Medtronic PPM implant 05/19/20 -Currently remains in sinus rhythm -Continue anticoagulation with Eliquis, repeat CBC today  - requested device interrogation from [employer] representative  Hypertension -Blood pressure is elevated, start bidil , continue carvedilol and Lasix  CKD stage III- IV -Creatinine 1.78 POA, was 2.39 from 04/07/21 discharge , improved  -Monitor BMP and UOP daily with diuresis  Insulin-dependent type 2 diabetes  Cholecystitis with cholecystectomy tube in place Left popliteal DVT since 03/2021 History of multiple ischemic CVAs Anemia  -Managed per IM   Risk Assessment/Risk Scores:      New York Heart Association (NYHA) Functional Class NYHA Class III  [id number] Score = 7  {Click here to calculate score.  This indicates a 11.2% annual risk of stroke. The patient's score is based upon: CHF History: Yes HTN History:  Yes Diabetes History: Yes Stroke History: Yes Vascular Disease History: No Age Score: 1 Gender Score: 1    For questions or updates, please contact [place] Please consult [email or web address] for contact info under    Signed, Tracey Billet, NP  07/27/2021 2:29 PM   History and all data above reviewed.  Patient examined.  I agree with the findings as above.  She lives by herself.  She has a family member who lives near who helps.  She uses a walker or rolling walker.  She is limited by previous strokes.  She presents with increased volume with edema and progressive SOB.  She has a known cardiomyopathy.  We have no data to understand the previous work up or etiology.  She has a pacemaker.  She has had no cardiology followup.  The device was inserted in [place] by [facility] but the patient was asked to come to [facility] for follow up and she could not do this.  We called her son who says she has been  out of meds and has had no ability to get to cardiology follow up.  The patient presents with acute on chronic CHF.  The patient exam reveals COR:RRR  ,  Lungs: Decreased breath sounds with bilateral crackles  ,  Abd: Positive bowel sounds, no rebound no guarding, Ext No edema  .  All available labs, radiology testing, previous records reviewed. Agree with documented assessment and plan.   Acute on chronic systolic HF:  The biggest issue seems to be lack of follow up.  We will continue IV diuresis.  Agree with current beta blocker and add BiDil.  We talked to her and her family about arranging follow up in [place].  At this point, given her frailty and comorbid illnesses I am not inclined to suggest an ischemia work up to identify the etiology of her HF.  We will proceed with medical management only.  Atrial fib:  Her device interrogation does not suggest a high burden of atrial fib.  No change in therapy.  PPM:      She is predominantly AS VS with some AP VS.  No evidence of mode switching or VP.  We  will arrange follow up.    Tracey Bryant  3:55 PM  07/27/2021

## 2021-07-27 NOTE — Progress Notes (Signed)
PROGRESS NOTE  Tracey Bryant  HCW:[id number] DOB: 1951-11-27 DOA: 07/26/2021 PCP: Coolidge Breeze, FNP   Brief Narrative: Tracey Bryant is a 70 y.o. female with a history of T2DM, PAF, DVT, PVD, CVA, HTN, acute cholecystitis s/p cholecystostomy March 2022 who presented to the ED at [facility] with increasing dyspnea, orthopnea, leg swelling, abdominal bloating and also found her drain dislodged just PTA. She was afebrile with WBC 11.2k, hgb 10.1, BNP 1291, creatinine elevated near baseline at 1.78 and albumin low at 2.9. CXR revealed chronic cardiomegaly and left base opacity consistent with atelectasis with acute findings of interstitial edema and bilateral pleural effusions.   Assessment & Plan: Principal Problem:   Acute on chronic combined systolic and diastolic congestive heart failure (HCC) Active Problems:   Type II diabetes mellitus (HCC)   Hypertension   Paroxysmal atrial fibrillation (HCC)   DVT (deep venous thrombosis) (HCC)   Normocytic anemia   Cholecystostomy tube dysfunction   Moderate protein-calorie malnutrition (HCC)   Acute on chronic HFrEF (heart failure with reduced ejection fraction) (HCC)  Acute hypoxic respiratory failure due to acute CHF, pleural effusions, atelectasis: Pt's SpO2 on room air at rest this morning was 87% with increased tachypnea during trial.  - Continue supplemental oxygen while diuresing, attempt to wean back to baseline (no O2) as able.  - Incentive spirometry.   Acute on chronic HFrEF, HTN: Has clinical evidence of overload, elevated BNP and pulmonary edema on CXR causing hypoxia. LVEF 25-30%, G2DD, decreased RV systolic function as well, severe LAE, pulmonary HTN (ASP 36 mmHg) by recent echo - Continue lasix 40mg  IV BID, monitor weights, I/O. - Outpatient weight May 2022 was 151lbs, currently 172lbs.  - Continue coreg, norvasc. Not on ACE/ARB reportedly due to CKD.   Cholecystitis s/p cholecystostomy:  - IR consulted to replace cholecystostomy  drain since pt's output has remained elevated prior to dislodgment   IDT2DM with symptomatic hypoglycemia: HbA1c 9.1% earlier this year, though CBG down to 40 this AM.  - Decrease basal insulin beginning  - Continue only sensitive SSI for now and monitor - Continue hypoglycemia protocol.   Stage IIIa CKD:  - Continue monitoring metabolic profile.   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 - Continue eliquis  - Continue zetia  DVT prophylaxis: Eliquis Code Status: Full Family Communication: Called son per pt request, no answer and VM not set up. Disposition Plan:  Status is: Inpatient. Pt newly hypoxic from acute CHF.  Remains inpatient appropriate because:Inpatient level of care appropriate due to severity of illness  Dispo: The patient is from: Home              Anticipated d/c is to: Home              Patient currently is not medically stable to d/c.   Difficult to place patient No  Consultants:  IR Cardiology  Procedures:  Replacement of gallbladder drain planned 8/1  Antimicrobials: None   Subjective: Feels short of breath, +orthopnea, leg swelling feels somewhat improved but still more swollen than normal. No chest pain or palpitations.   Objective: Vitals:   07/26/21 2200 07/27/21 0330 07/27/21 0500 07/27/21 0802  BP: (!) 154/76 129/64 139/64 (!) 148/67  Pulse: 79 66 65 70  Resp: (!) 23 19 16 20   Temp:  97.6 F (36.4 C)  97.9 F (36.6 C)  TempSrc:  Oral  Oral  SpO2: 94% 95% 93% 94%  Weight:      Height:        Intake/Output Summary (Last 24 hours) at 07/27/2021 0840 Last data filed at 07/27/2021 9518 Gross per 24 hour  Intake 360 ml  Output 1400 ml  Net -1040 ml   Filed Weights   07/26/21 1510  Weight: 78.5 kg    Gen: 70 y.o. female in no distress Pulm: Tachypnea, some pushing toward end of sentences at rest, crackles/diminished at bases  bilaterally.  CV: Regular rate and rhythm. No murmur, rub, or gallop. No JVD, + LE pitting  edema. GI: Abdomen soft, mild RUQ tenderness without rebound or guarding, with normoactive bowel sounds. No organomegaly or masses felt. Ext: Warm, no deformities Skin: RUQ drain site without significant erythema or discharge, appropriately tender. Neuro: Alert and oriented. No focal neurological deficits. Psych: Judgement and insight appear normal. Mood & affect appropriate.   Data Reviewed: I have personally reviewed following labs and imaging studies  CBC: Recent Labs  Lab 07/26/21 1601  WBC 11.3*  NEUTROABS 8.3*  HGB 10.1*  HCT 32.4*  MCV 96.7  PLT 841*   Basic Metabolic Panel: Recent Labs  Lab 07/26/21 1601  NA 138  K 4.0  CL 111  CO2 20*  GLUCOSE 78  BUN 23  CREATININE 1.78*  CALCIUM 8.4*   GFR: Estimated Creatinine Clearance: 31.6 mL/min (A) (by C-G formula based on SCr of 1.78 mg/dL (H)). Liver Function Tests: Recent Labs  Lab 07/26/21 1601  AST 17  ALT 10  ALKPHOS 54  BILITOT 0.9  PROT 6.5  ALBUMIN 2.9*   Recent Labs  Lab 07/26/21 1601  LIPASE 31   No results for input(s): AMMONIA in the last 168 hours. Coagulation Profile: No results for input(s): INR, PROTIME in the last 168 hours. Cardiac Enzymes: No results for input(s): CKTOTAL, CKMB, CKMBINDEX, TROPONINI in the last 168 hours. BNP (last 3 results) No results for input(s): PROBNP in the last 8760 hours. HbA1C: No results for input(s): HGBA1C in the last 72 hours. CBG: Recent Labs  Lab 07/27/21 0634 07/27/21 0703  GLUCAP 40* 71   Lipid Profile: No results for input(s): CHOL, HDL, LDLCALC, TRIG, CHOLHDL, LDLDIRECT in the last 72 hours. Thyroid Function Tests: No results for input(s): TSH, T4TOTAL, FREET4, T3FREE, THYROIDAB in the last 72 hours. Anemia Panel: No results for input(s): VITAMINB12, FOLATE, FERRITIN, TIBC, IRON, RETICCTPCT in the last 72 hours. Urine analysis:    Component  Value Date/Time   COLORURINE AMBER (A) 03/17/2021 1208   APPEARANCEUR CLOUDY (A) 03/17/2021 1208   LABSPEC 1.015 03/17/2021 1208   PHURINE 5.0 03/17/2021 1208   GLUCOSEU 50 (A) 03/17/2021 1208   HGBUR SMALL (A) 03/17/2021 1208   BILIRUBINUR NEGATIVE 03/17/2021 [place] 03/17/2021 1208   PROTEINUR >=300 (A) 03/17/2021 1208   UROBILINOGEN 0.2 01/17/2013 1910   NITRITE NEGATIVE 03/17/2021 1208   LEUKOCYTESUR LARGE (A) 03/17/2021 1208   Recent Results (from the past 240 hour(s))  Resp Panel by RT-PCR (Flu A&B, Covid) Nasopharyngeal Swab     Status: None   Collection Time: 07/26/21  4:02 PM   Specimen: Nasopharyngeal Swab; Nasopharyngeal(NP) swabs in vial transport medium  Result Value Ref Range Status   SARS Coronavirus 2 by RT PCR NEGATIVE NEGATIVE Final    Comment: (NOTE) SARS-CoV-2 target nucleic acids are NOT DETECTED.  The SARS-CoV-2 RNA is generally detectable in upper respiratory specimens during the acute phase of infection. The lowest  concentration of SARS-CoV-2 viral copies this assay can detect is 138 copies/mL. A negative result does not preclude SARS-Cov-2 infection and should not be used as the sole basis for treatment or other patient management decisions. A negative result may occur with  improper specimen collection/handling, submission of specimen other than nasopharyngeal swab, presence of viral mutation(s) within the areas targeted by this assay, and inadequate number of viral copies(<138 copies/mL). A negative result must be combined with clinical observations, patient history, and epidemiological information. The expected result is Negative.  Fact Sheet for Patients:  EntrepreneurPulse.com.au  Fact Sheet for Healthcare Providers:  IncredibleEmployment.be  This test is no t yet approved or cleared by the [place] FDA and  has been authorized for detection and/or diagnosis of SARS-CoV-2 by FDA under  an Emergency Use Authorization (EUA). This EUA will remain  in effect (meaning this test can be used) for the duration of the COVID-19 declaration under Section 564(b)(1) of the Act, 21 U.S.C.section 360bbb-3(b)(1), unless the authorization is terminated  or revoked sooner.       Influenza A by PCR NEGATIVE NEGATIVE Final   Influenza B by PCR NEGATIVE NEGATIVE Final    Comment: (NOTE) The Xpert Xpress SARS-CoV-2/FLU/RSV plus assay is intended as an aid in the diagnosis of influenza from Nasopharyngeal swab specimens and should not be used as a sole basis for treatment. Nasal washings and aspirates are unacceptable for Xpert Xpress SARS-CoV-2/FLU/RSV testing.  Fact Sheet for Patients: EntrepreneurPulse.com.au  Fact Sheet for Healthcare Providers: IncredibleEmployment.be  This test is not yet approved or cleared by the [place] FDA and has been authorized for detection and/or diagnosis of SARS-CoV-2 by FDA under an Emergency Use Authorization (EUA). This EUA will remain in effect (meaning this test can be used) for the duration of the COVID-19 declaration under Section 564(b)(1) of the Act, 21 U.S.C. section 360bbb-3(b)(1), unless the authorization is terminated or revoked.  Performed at [facility], [address], [place], [place] [address]   MRSA Next Gen by PCR, Nasal     Status: None   Collection Time: 07/27/21  1:16 AM   Specimen: Nasal Mucosa; Nasal Swab  Result Value Ref Range Status   MRSA by PCR Next Gen NOT DETECTED NOT DETECTED Final    Comment: (NOTE) The GeneXpert MRSA Assay (FDA approved for NASAL specimens only), is one component of a comprehensive MRSA colonization surveillance program. It is not intended to diagnose MRSA infection nor to guide or monitor treatment for MRSA infections. Test performance is not FDA approved in patients less than 48 years old. Performed at [facility], [place] [address],  [place], [place] [address]       Radiology Studies: DG Chest Port 1 View  Result Date: 07/26/2021 CLINICAL DATA:  Shortness of breath for 4 days EXAM: PORTABLE CHEST 1 VIEW COMPARISON:  04/06/2021 FINDINGS: Pacer with leads at right atrium and right ventricle. No lead discontinuity. Midline trachea. Moderate cardiomegaly. Probable small bilateral pleural effusions. No pneumothorax. Pulmonary interstitial prominence and indistinctness is mildly increased. Similar left base opacity. IMPRESSION: Cardiomegaly with worsened interstitial edema and probable small bilateral pleural effusions. Similar left base airspace disease, likely atelectasis. Electronically Signed   By: Abigail Miyamoto M.D.   On: 07/26/2021 16:36    Scheduled Meds:  amLODipine  10 mg Oral Daily   apixaban  5 mg Oral BID   ezetimibe  10 mg Oral Daily   furosemide  40 mg Intravenous BID   insulin aspart  0-9 Units Subcutaneous TID WC   insulin glargine-yfgn  10 Units Subcutaneous QHS   mouth rinse  15 mL Mouth Rinse BID   tamsulosin  0.4 mg Oral q morning   Continuous Infusions:   LOS: 0 days   Time spent: 35 minutes.  Patrecia Pour, MD [facility] [email or web address] 07/27/2021, 8:40 AM

## 2021-07-27 NOTE — Progress Notes (Signed)
Heart Failure Stewardship Pharmacist Progress Note   PCP: Coolidge Breeze, FNP PCP-Cardiologist: None    HPI:  70 yo F with PMH of CHF, T2DM, afib, DVT, acute cholecystitis s/p cholecystostomy, and HTN. She presented to the ED on 7/31 with shortness of breath, orthopnea, abdominal distention, and LE edema. CXR on admission with cardiomegaly, worsened interstitial edema, and probably small bilateral pleural effusions. Last ECHO was done on 03/18/21 and LVEF was 25-30% with G2DD.  Current HF Medications: Furosemide 40 mg IV BID Carvedilol 3.125 mg BID  Prior to admission HF Medications: Furosemide 20 mg daily Carvedilol 3.125 mg BID  Pertinent Lab Values: Serum creatinine 1.78, BUN 23, Potassium 4.0, Sodium 138, BNP 1291.0  Vital Signs: Weight: 173 lbs (admission weight: 173 lbs) Blood pressure: 150/70s  Heart rate: 70s   Medication Assistance / Insurance Benefits Check: Does the patient have prescription insurance?  Yes Type of insurance plan: [name]  Does the patient qualify for medication assistance through manufacturers or grants? Pending  Eligible grants and/or patient assistance programs: pending Medication assistance applications in progress: none  Medication assistance applications approved: none Approved medication assistance renewals will be completed by: pending  Outpatient Pharmacy:  Prior to admission outpatient pharmacy: [facility] Is the patient willing to use [place] pharmacy at discharge? Yes Is the patient willing to transition their outpatient pharmacy to utilize a [facility] outpatient pharmacy?   Pending    Assessment: 1. Acute on chronic systolic CHF (EF 61-68%). NYHA class III symptoms. - Continue furosemide 80 mg IV BID - Continue carvedilol 3.125 mg BID - Consider starting Entresto 24/26 mg BID. SCr 1.78 today (has been >2 in the past). - Consider starting spironolactone and SGTL2i prior to discharge   Plan: 1) Medication changes  recommended at this time: - Start Entresto 24/26 mg BID  2) Patient assistance: [name] copay $47 per month - Farxiga/Jardiance copay $47 per month - Can help enroll in PAN grant or patient assistance to lower copays to $0 per month  3)  Education  - To be completed prior to discharge  Kerby Nora, PharmD, BCPS Heart Failure Cytogeneticist Phone [phone]

## 2021-07-27 NOTE — Progress Notes (Signed)
Hypoglycemic Event  CBG: 40  Treatment: 8 oz juice/soda  Symptoms: None  Follow-up CBG: Time: 0703 CBG Result: 71  Possible Reasons for Event: Inadequate meal intake  Comments/MD notified: yes    Ugo Thoma N

## 2021-07-27 NOTE — Procedures (Signed)
Interventional Radiology Procedure Note  Procedure: Image guided rescue of displaced perc chole.  8F pigtail drain.  Complications: None  Recommendations: - Routine drain care, to gravity record output - Patient has follow up with surgery - routine exchange in 3 months.  IF at 3 months the patient will not be getting surgery, would refer to our [place] clinic to discuss options with percutaneous management (spyglass) - routine wound care  Signed,  Dulcy Fanny. Earleen Newport, DO

## 2021-07-27 NOTE — Progress Notes (Signed)
Inpatient Diabetes Program Recommendations  AACE/ADA: New Consensus Statement on Inpatient Glycemic Control (2015)  Target Ranges:  Prepandial:   less than 140 mg/dL      Peak postprandial:   less than 180 mg/dL (1-2 hours)      Critically ill patients:  140 - 180 mg/dL   Lab Results  Component Value Date   GLUCAP 71 07/27/2021   HGBA1C 9.1 (H) 03/16/2021    Review of Glycemic Control Results for Tracey Bryant, Tracey Bryant (MRN [id number]) as of 07/27/2021 10:56  Ref. Range 07/27/2021 06:34 07/27/2021 07:03  Glucose-Capillary Latest Ref Range: 70 - 99 mg/dL 40 (LL) 71   Diabetes history: Type 2 DM Outpatient Diabetes medications: Lantus 25 units QHS, Novolog 0-8 units TID Current orders for Inpatient glycemic control: Semglee 10 units QHS, Novolog 0-9 units TID  Inpatient Diabetes Program Recommendations:   Noted hypoglycemia of 40 mg/dL. Patient did not receive QHS dose.  Consider discontinuing Semglee 10 units QHS.   Thanks, Bronson Curb, MSN, RNC-OB Diabetes Coordinator [phone] (8a-5p)

## 2021-07-28 ENCOUNTER — Other Ambulatory Visit (HOSPITAL_COMMUNITY): Payer: Self-pay

## 2021-07-28 LAB — GLUCOSE, CAPILLARY
Glucose-Capillary: 167 mg/dL — ABNORMAL HIGH (ref 70–99)
Glucose-Capillary: 169 mg/dL — ABNORMAL HIGH (ref 70–99)
Glucose-Capillary: 214 mg/dL — ABNORMAL HIGH (ref 70–99)
Glucose-Capillary: 188 mg/dL — ABNORMAL HIGH (ref 70–99)

## 2021-07-28 MED ORDER — MAGNESIUM SULFATE IN D5W 1-5 GM/100ML-% IV SOLN
1.0000 g | Freq: Once | INTRAVENOUS | Status: AC
  Administered 2021-07-28: 1 g via INTRAVENOUS
  Filled 2021-07-28: qty 100

## 2021-07-28 MED ORDER — CARVEDILOL 6.25 MG PO TABS
6.2500 mg | ORAL_TABLET | Freq: Two times a day (BID) | ORAL | Status: DC
  Administered 2021-07-28 – 2021-08-01 (×9): 6.25 mg via ORAL
  Filled 2021-07-28 (×9): qty 1

## 2021-07-28 MED ORDER — POTASSIUM CHLORIDE CRYS ER 20 MEQ PO TBCR
20.0000 meq | EXTENDED_RELEASE_TABLET | Freq: Once | ORAL | Status: AC
  Administered 2021-07-28: 20 meq via ORAL
  Filled 2021-07-28: qty 1

## 2021-07-28 NOTE — Progress Notes (Signed)
Heart Failure Stewardship Pharmacist Progress Note   PCP: Coolidge Breeze, FNP PCP-Cardiologist: None    HPI:  70 yo F with PMH of CHF, T2DM, afib, DVT, acute cholecystitis s/p cholecystostomy, and HTN. She presented to the ED on 7/31 with shortness of breath, orthopnea, abdominal distention, and LE edema. CXR on admission with cardiomegaly, worsened interstitial edema, and probably small bilateral pleural effusions. Last ECHO was done on 03/18/21 and LVEF was 25-30% with G2DD.  Current HF Medications: Furosemide 40 mg IV BID Carvedilol 6.25 mg BID BiDil 20/37.5 mg 1 tab TID  Prior to admission HF Medications: Furosemide 20 mg daily Carvedilol 3.125 mg BID  Pertinent Lab Values: Serum creatinine 1.66, BUN 18, Potassium 3.9, Sodium 136, BNP 1291.0  Vital Signs: Weight: 189 lbs (admission weight: 173 lbs although question if this was estimated weight) Blood pressure: 120/70s  Heart rate: 60s   Medication Assistance / Insurance Benefits Check: Does the patient have prescription insurance?  Yes Type of insurance plan: [name] Medicare  Does the patient qualify for medication assistance through manufacturers or grants? Pending  Eligible grants and/or patient assistance programs: pending Medication assistance applications in progress: none  Medication assistance applications approved: none Approved medication assistance renewals will be completed by: pending  Outpatient Pharmacy:  Prior to [facility] pharmacy: [facility] Is the patient willing to use [place] pharmacy at discharge? Yes Is the patient willing to transition their outpatient pharmacy to utilize a [facility] outpatient pharmacy?   Pending    Assessment: 1. Acute on chronic systolic CHF (EF 07-12%). NYHA class III symptoms. - Continue furosemide 40 mg IV BID - Agree with increasing to carvedilol 6.25 mg BID - Consider starting Entresto 24/26 mg BID. SCr down to 1.66 today (has been >2 in the past). - Consider  starting spironolactone and SGTL2i prior to discharge. eGFR >30. - Continue BiDil 1 tab TID   Plan: 1) Medication changes recommended at this time: - Continue current regimen; agree with changes as above  2) Patient assistance: - Entresto copay $47 per month - Farxiga/Jardiance copay $47 per month - BiDil copay $109.77 per month ($40 per month if broken up to hydralazine + Imdur) - Can help enroll in PAN grant or patient assistance to lower copays to $0 per month  3)  Education  - To be completed prior to discharge  Kerby Nora, PharmD, BCPS Heart Failure Cytogeneticist Phone [phone]

## 2021-07-28 NOTE — Progress Notes (Signed)
Progress Note  Patient Name: Tracey Bryant Date of Encounter: 07/28/2021  Primary Cardiologist:   None   Subjective   No chest pain.  DOE with exertion.  Was able to ambulate yesterday.    Inpatient Medications    Scheduled Meds:  apixaban  5 mg Oral BID   carvedilol  3.125 mg Oral BID WC   ezetimibe  10 mg Oral Daily   furosemide  40 mg Intravenous BID   insulin aspart  0-9 Units Subcutaneous TID WC   insulin glargine-yfgn  10 Units Subcutaneous QHS   isosorbide-hydrALAZINE  1 tablet Oral TID   mouth rinse  15 mL Mouth Rinse BID   potassium chloride  20 mEq Oral Once   tamsulosin  0.4 mg Oral q morning   Continuous Infusions:  magnesium sulfate bolus IVPB     PRN Meds: acetaminophen **OR** acetaminophen, ondansetron **OR** ondansetron (ZOFRAN) IV, senna-docusate   Vital Signs    Vitals:   07/27/21 2356 07/28/21 0335 07/28/21 0638 07/28/21 0727  BP: (!) 126/56 104/74 (!) 122/54 134/61  Pulse: 67 62 60 65  Resp: 18 17  20   Temp: 97.8 F (36.6 C) (!) 97.4 F (36.3 C)  97.7 F (36.5 C)  TempSrc: Axillary Oral    SpO2: 93% 97%  93%  Weight:  86.1 kg    Height:        Intake/Output Summary (Last 24 hours) at 07/28/2021 0809 Last data filed at 07/28/2021 0650 Gross per 24 hour  Intake 440 ml  Output 2800 ml  Net -2360 ml   Filed Weights   07/26/21 1510 07/28/21 0335  Weight: 78.5 kg 86.1 kg    Telemetry    NSR, occasional paced atrial rhythm - Personally Reviewed  ECG    NA - Personally Reviewed  Physical Exam   GEN: No acute distress.   Neck: No  JVD Cardiac: RRR, no murmurs, rubs, or gallops.  Respiratory: Clear to auscultation bilaterally. GI: Soft, nontender, non-distended  MS: No edema; No deformity. Neuro:  Nonfocal  Psych: Normal affect   Labs    Chemistry Recent Labs  Lab 07/26/21 1601 07/27/21 1418  NA 138 136  K 4.0 3.9  CL 111 106  CO2 20* 21*  GLUCOSE 78 156*  BUN 23 18  CREATININE 1.78* 1.66*  CALCIUM 8.4* 8.4*   PROT 6.5  --   ALBUMIN 2.9*  --   AST 17  --   ALT 10  --   ALKPHOS 54  --   BILITOT 0.9  --   GFRNONAA 31* 33*  ANIONGAP 7 9     Hematology Recent Labs  Lab 07/26/21 1601 07/27/21 0354 07/27/21 1418  WBC 11.3* 10.6* 10.3  RBC 3.35* 3.38* 3.53*  HGB 10.1* 10.1* 10.3*  HCT 32.4* 32.2* 32.4*  MCV 96.7 95.3 91.8  MCH 30.1 29.9 29.2  MCHC 31.2 31.4 31.8  RDW 17.6* 17.4* 17.3*  PLT 452* 475* 322    Cardiac EnzymesNo results for input(s): TROPONINI in the last 168 hours. No results for input(s): TROPIPOC in the last 168 hours.   BNP Recent Labs  Lab 07/26/21 1601  BNP 1,291.0*     DDimer No results for input(s): DDIMER in the last 168 hours.   Radiology    DG Chest Port 1 View  Result Date: 07/26/2021 CLINICAL DATA:  Shortness of breath for 4 days EXAM: PORTABLE CHEST 1 VIEW COMPARISON:  04/06/2021 FINDINGS: Pacer with leads at right atrium and right ventricle.  No lead discontinuity. Midline trachea. Moderate cardiomegaly. Probable small bilateral pleural effusions. No pneumothorax. Pulmonary interstitial prominence and indistinctness is mildly increased. Similar left base opacity. IMPRESSION: Cardiomegaly with worsened interstitial edema and probable small bilateral pleural effusions. Similar left base airspace disease, likely atelectasis. Electronically Signed   By: Abigail Miyamoto M.D.   On: 07/26/2021 16:36    Cardiac Studies     Echocardiogram from 03/18/2021:   1. Left ventricular ejection fraction, by estimation, is 25 to 30%. The  left ventricle has normal function. The left ventricle demonstrates global  hypokinesis. There is mild left ventricular hypertrophy. Left ventricular  diastolic parameters are  consistent with Grade II diastolic dysfunction (pseudonormalization).  Elevated left atrial pressure.   2. RV poorly visualized, grossly appears enlarged with decreased systolic  function. . Right ventricular systolic function was not well visualized.  The  right ventricular size is not well visualized.   3. Left atrial size was severely dilated.   4. The mitral valve is normal in structure. No evidence of mitral valve  regurgitation. No evidence of mitral stenosis.   5. The aortic valve is tricuspid. There is mild calcification of the  aortic valve. There is mild thickening of the aortic valve. Aortic valve  regurgitation is not visualized. No aortic stenosis is present.   6. Mild pulmonary HTN, PASP is 36 mmHg.   7. The inferior vena cava is normal in size with greater than 50%  respiratory variability, suggesting right atrial pressure of 3 mmHg.    Patient Profile     70 y.o. 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 Bonner Puna.   Assessment & Plan    ACUTE ON CHRONIC SYSTOLIC AND DIASTOLIC HF:      Started BiDil this admission.  Not ARB/ARNI candidate with CKD.  Intake and output net negative 3.4 liters.    Given the longstanding nature of this had her frailty with comorbid condition, I am not at this time considering an ischemia work up although we could consider this in the future.   I will increase the beta blocker today.    CKD IIIB: Creat is stable.  Follow.    ATRIAL FIB:  No recent or sustained events.  Continue DOAC given her previous history of this.   HTN:   This is being managed in the context of treating his CHF    For questions or updates, please contact [facility] Please consult [email or web address] for contact info under Cardiology/STEMI.   Signed, Minus Breeding, MD  07/28/2021, 8:09 AM

## 2021-07-28 NOTE — Progress Notes (Addendum)
Reported by mobility specialist that pt had a feeling of passing out during ambulation. Assisted back to bed, bp-153/83, hr-90 , 98 % spo2 on 3Lnc. Pt claimed feeling much better after few min in bed. Made comfortable on bed, continue to monitor.MD made aware thru secure chat.

## 2021-07-28 NOTE — Progress Notes (Signed)
Mobility Specialist: Progress Note   07/28/21 1327  Mobility  Activity Ambulated in hall  Level of Assistance Minimal assist, patient does 75% or more  Assistive Device Four wheel walker  Distance Ambulated (ft) 180 ft (90'x2)  Mobility Ambulated with assistance in hallway  Mobility Response Tolerated fair  Mobility performed by Mobility specialist  $Mobility charge 1 Mobility   Pre-Mobility: 67 HR, 98% SpO2 During Mobility: 92 HR, 98% SpO2 Post-Mobility: 90 HR, 153/83 BP, 98% SpO2  Pt ambulated on 4 L/min [place]. Pt required contact guard during ambulation d/t unsteadiness. Pt tachypneic throughout ambulation, coached through purse-lipped breathing. Pt became laterally unsteady to the point of needing to sit and be wheeled back to the room after 118ft. After returning to the room pt said she felt dizzy and light headed, BP stable. Upon standing at the rollator pt's eyes rolled back like she was going to pass out. Pt was able to stand with minA and said she felt fine to transfer after stablizing. Pt back in bed, RN present in the room and aware of dizzy/light headed epsiode. Pt has call bell and phone at her side and bed alarm on. Pt did mention yesterday about it being discovered while she was at rehab back in March of this year of her having BPPV.   [facility] Silverio Hagan Mobility Specialist Mobility Specialist Phone: [phone]

## 2021-07-28 NOTE — Progress Notes (Addendum)
PROGRESS NOTE  Tracey Bryant  BLT:[id number] DOB: August 10, 1951 DOA: 07/26/2021 PCP: Coolidge Breeze, FNP   Brief Narrative: Tracey Bryant is a 70 y.o. female with a history of T2DM, PAF, DVT, PVD, CVA, HTN, acute cholecystitis s/p cholecystostomy March 2022 who presented to the ED at [facility] with increasing dyspnea, orthopnea, leg swelling, abdominal bloating and also found her drain dislodged just PTA. She was afebrile with WBC 11.2k, hgb 10.1, BNP 1291, creatinine elevated near baseline at 1.78 and albumin low at 2.9. CXR revealed chronic cardiomegaly and left base opacity consistent with atelectasis with acute findings of interstitial edema and bilateral pleural effusions.   Assessment & Plan: Principal Problem:   Acute on chronic combined systolic and diastolic congestive heart failure (HCC) Active Problems:   Type II diabetes mellitus (HCC)   Hypertension   Paroxysmal atrial fibrillation (HCC)   DVT (deep venous thrombosis) (HCC)   Normocytic anemia   Cholecystostomy tube dysfunction   Moderate protein-calorie malnutrition (HCC)   Acute on chronic HFrEF (heart failure with reduced ejection fraction) (HCC)  Acute hypoxic respiratory failure due to acute CHF, pleural effusions, atelectasis: Pt's SpO2 on room air at rest this morning was 87% with increased tachypnea during trial.  - Continue supplemental oxygen while diuresing, attempt to wean back to baseline (no O2) as able. Remains hypoxic with exertional dyspnea and crackles on exam. - Incentive spirometry.   Acute on chronic HFrEF, HTN: Has clinical evidence of overload, elevated BNP and pulmonary edema on CXR causing hypoxia. LVEF 25-30%, G2DD, decreased RV systolic function as well, severe LAE, pulmonary HTN (ASP 36 mmHg) by recent echo - Continue lasix 40mg  IV BID with good UOP (2.6L yesterday), monitor weights, I/O. - Outpatient weight May 2022 was 151lbs, currently 172lbs > 186?? - Continue coreg, norvasc. Increase coreg. Started  bidil this admission with improvement in HTN. Not on ACE/ARB due to CKD.   Cholecystitis s/p cholecystostomy: Dislodged PTA and replaced in IR 8/1.  - Drain output significant since replacement, exam benign. Continue monitoring output to gravity and routine wound care. - Routine exchange recommended in 3 months. If not planning surgery, IR plans to refer to [place] [facility] for percutaneous management (spyglass)  IDT2DM with symptomatic hypoglycemia: HbA1c 9.1% earlier this year.  - At inpatient goal with reduced lantus dosing. Continue.  - Continue only sensitive SSI for now and monitor - Continue hypoglycemia protocol.   Stage IIIa CKD: Stable-to-improved thus far with diuresis - Continue monitoring metabolic profile.   PAF s/p PPM:  - Continue eliquis - Continue coreg, increased dose per cardiology - Supp K and Mg with ongoing diuresis.   Left popliteal DVT: Dx April 2022 - Continue anticoagulation  History of CVAs: April 2022. - Continue eliquis  - Continue zetia  DVT prophylaxis: Eliquis Code Status: Full Family Communication: None at bedside. Cardiology did get a hold of Son by phone yesterday afternoon. Disposition Plan:  Status is: Inpatient. Pt newly hypoxic from acute CHF.  Remains inpatient appropriate because:Inpatient level of care appropriate due to severity of illness  Dispo: The patient is from: Home              Anticipated d/c is to: Home              Patient currently is not medically stable to d/c.   Difficult to place patient No  Consultants:  IR Cardiology  Procedures:  Image-guided rescue of displaced perc chole 58F pigtail drain by Dr. Earleen Newport 8/1  Antimicrobials:  None   Subjective: Improvement in leg swelling but remains short of breath on exertion and even while talking on the phone at rest. No chest pain or other complaints. Urinating robustly.   Objective: Vitals:   07/27/21 2356 07/28/21 0335 07/28/21 0638 07/28/21 0727  BP: (!) 126/56  104/74 (!) 122/54 134/61  Pulse: 67 62 60 65  Resp: 18 17  20   Temp: 97.8 F (36.6 C) (!) 97.4 F (36.3 C)  97.7 F (36.5 C)  TempSrc: Axillary Oral    SpO2: 93% 97%  93%  Weight:  86.1 kg    Height:        Intake/Output Summary (Last 24 hours) at 07/28/2021 1011 Last data filed at 07/28/2021 0650 Gross per 24 hour  Intake 440 ml  Output 2800 ml  Net -2360 ml   Filed Weights   07/26/21 1510 07/28/21 0335  Weight: 78.5 kg 86.1 kg   Gen: 70 y.o. female in no distress Pulm: Tachypneic while speaking, crackles at bases. CV: Regular rate and rhythm. No murmur, rub, or gallop. Mild JVD, ++ dependent edema. GI: Abdomen soft, non-tender, non-distended, with normoactive bowel sounds.  Ext: Warm, no deformities Skin: No new rashes, lesions or ulcers on visualized skin. Neuro: Alert and conversant, oriented to person, place, situation. No focal neurological deficits. Psych: Judgement and insight appear fair. Mood euthymic & affect congruent. Behavior is appropriate.    Data Reviewed: I have personally reviewed following labs and imaging studies  CBC: Recent Labs  Lab 07/26/21 1601 07/27/21 0354 07/27/21 1418  WBC 11.3* 10.6* 10.3  NEUTROABS 8.3*  --   --   HGB 10.1* 10.1* 10.3*  HCT 32.4* 32.2* 32.4*  MCV 96.7 95.3 91.8  PLT 452* 475* 416   Basic Metabolic Panel: Recent Labs  Lab 07/26/21 1601 07/27/21 1418  NA 138 136  K 4.0 3.9  CL 111 106  CO2 20* 21*  GLUCOSE 78 156*  BUN 23 18  CREATININE 1.78* 1.66*  CALCIUM 8.4* 8.4*  MG  --  1.8   GFR: Estimated Creatinine Clearance: 35.3 mL/min (A) (by C-G formula based on SCr of 1.66 mg/dL (H)). Liver Function Tests: Recent Labs  Lab 07/26/21 1601  AST 17  ALT 10  ALKPHOS 54  BILITOT 0.9  PROT 6.5  ALBUMIN 2.9*   Recent Labs  Lab 07/26/21 1601  LIPASE 31   No results for input(s): AMMONIA in the last 168 hours. Coagulation Profile: No results for input(s): INR, PROTIME in the last 168 hours. Cardiac  Enzymes: No results for input(s): CKTOTAL, CKMB, CKMBINDEX, TROPONINI in the last 168 hours. BNP (last 3 results) No results for input(s): PROBNP in the last 8760 hours. HbA1C: No results for input(s): HGBA1C in the last 72 hours. CBG: Recent Labs  Lab 07/27/21 0703 07/27/21 1126 07/27/21 1501 07/27/21 2105 07/28/21 0616  GLUCAP 71 147* 156* 159* 167*   Lipid Profile: No results for input(s): CHOL, HDL, LDLCALC, TRIG, CHOLHDL, LDLDIRECT in the last 72 hours. Thyroid Function Tests: No results for input(s): TSH, T4TOTAL, FREET4, T3FREE, THYROIDAB in the last 72 hours. Anemia Panel: No results for input(s): VITAMINB12, FOLATE, FERRITIN, TIBC, IRON, RETICCTPCT in the last 72 hours. Urine analysis:    Component Value Date/Time   COLORURINE AMBER (A) 03/17/2021 1208   APPEARANCEUR CLOUDY (A) 03/17/2021 1208   LABSPEC 1.015 03/17/2021 1208   PHURINE 5.0 03/17/2021 1208   GLUCOSEU 50 (A) 03/17/2021 1208   HGBUR SMALL (A) 03/17/2021 1208   BILIRUBINUR NEGATIVE  03/17/2021 [place] 03/17/2021 1208   PROTEINUR >=300 (A) 03/17/2021 1208   UROBILINOGEN 0.2 01/17/2013 1910   NITRITE NEGATIVE 03/17/2021 1208   LEUKOCYTESUR LARGE (A) 03/17/2021 1208   Recent Results (from the past 240 hour(s))  Resp Panel by RT-PCR (Flu A&B, Covid) Nasopharyngeal Swab     Status: None   Collection Time: 07/26/21  4:02 PM   Specimen: Nasopharyngeal Swab; Nasopharyngeal(NP) swabs in vial transport medium  Result Value Ref Range Status   SARS Coronavirus 2 by RT PCR NEGATIVE NEGATIVE Final    Comment: (NOTE) SARS-CoV-2 target nucleic acids are NOT DETECTED.  The SARS-CoV-2 RNA is generally detectable in upper respiratory specimens during the acute phase of infection. The lowest concentration of SARS-CoV-2 viral copies this assay can detect is 138 copies/mL. A negative result does not preclude SARS-Cov-2 infection and should not be used as the sole basis for treatment or other patient  management decisions. A negative result may occur with  improper specimen collection/handling, submission of specimen other than nasopharyngeal swab, presence of viral mutation(s) within the areas targeted by this assay, and inadequate number of viral copies(<138 copies/mL). A negative result must be combined with clinical observations, patient history, and epidemiological information. The expected result is Negative.  Fact Sheet for Patients:  EntrepreneurPulse.com.au  Fact Sheet for Healthcare Providers:  IncredibleEmployment.be  This test is no t yet approved or cleared by the [place] FDA and  has been authorized for detection and/or diagnosis of SARS-CoV-2 by FDA under an Emergency Use Authorization (EUA). This EUA will remain  in effect (meaning this test can be used) for the duration of the COVID-19 declaration under Section 564(b)(1) of the Act, 21 U.S.C.section 360bbb-3(b)(1), unless the authorization is terminated  or revoked sooner.       Influenza A by PCR NEGATIVE NEGATIVE Final   Influenza B by PCR NEGATIVE NEGATIVE Final    Comment: (NOTE) The Xpert Xpress SARS-CoV-2/FLU/RSV plus assay is intended as an aid in the diagnosis of influenza from Nasopharyngeal swab specimens and should not be used as a sole basis for treatment. Nasal washings and aspirates are unacceptable for Xpert Xpress SARS-CoV-2/FLU/RSV testing.  Fact Sheet for Patients: EntrepreneurPulse.com.au  Fact Sheet for Healthcare Providers: IncredibleEmployment.be  This test is not yet approved or cleared by the [place] FDA and has been authorized for detection and/or diagnosis of SARS-CoV-2 by FDA under an Emergency Use Authorization (EUA). This EUA will remain in effect (meaning this test can be used) for the duration of the COVID-19 declaration under Section 564(b)(1) of the Act, 21 U.S.C. section 360bbb-3(b)(1),  unless the authorization is terminated or revoked.  Performed at [facility], [address] [place], [place] [address]   MRSA Next Gen by PCR, Nasal     Status: None   Collection Time: 07/27/21  1:16 AM   Specimen: Nasal Mucosa; Nasal Swab  Result Value Ref Range Status   MRSA by PCR Next Gen NOT DETECTED NOT DETECTED Final    Comment: (NOTE) The GeneXpert MRSA Assay (FDA approved for NASAL specimens only), is one component of a comprehensive MRSA colonization surveillance program. It is not intended to diagnose MRSA infection nor to guide or monitor treatment for MRSA infections. Test performance is not FDA approved in patients less than 42 years old. Performed at [facility], [place] [address] [place], [place] [address]       Radiology Studies: DG Chest Port 1 View  Result Date: 07/26/2021 CLINICAL  DATA:  Shortness of breath for 4 days EXAM: PORTABLE CHEST 1 VIEW COMPARISON:  04/06/2021 FINDINGS: Pacer with leads at right atrium and right ventricle. No lead discontinuity. Midline trachea. Moderate cardiomegaly. Probable small bilateral pleural effusions. No pneumothorax. Pulmonary interstitial prominence and indistinctness is mildly increased. Similar left base opacity. IMPRESSION: Cardiomegaly with worsened interstitial edema and probable small bilateral pleural effusions. Similar left base airspace disease, likely atelectasis. Electronically Signed   By: Abigail Miyamoto M.D.   On: 07/26/2021 16:36    Scheduled Meds:  apixaban  5 mg Oral BID   carvedilol  6.25 mg Oral BID WC   ezetimibe  10 mg Oral Daily   furosemide  40 mg Intravenous BID   insulin aspart  0-9 Units Subcutaneous TID WC   insulin glargine-yfgn  10 Units Subcutaneous QHS   isosorbide-hydrALAZINE  1 tablet Oral TID   mouth rinse  15 mL Mouth Rinse BID   tamsulosin  0.4 mg Oral q morning   Continuous Infusions:  magnesium sulfate bolus IVPB       LOS: 1 day   Time spent: 35 minutes.  Patrecia Pour,  MD [facility] [email or web address] 07/28/2021, 10:11 AM

## 2021-07-29 LAB — BASIC METABOLIC PANEL
Anion gap: 10 (ref 5–15)
BUN: 20 mg/dL (ref 8–23)
CO2: 24 mmol/L (ref 22–32)
Calcium: 8.3 mg/dL — ABNORMAL LOW (ref 8.9–10.3)
Chloride: 101 mmol/L (ref 98–111)
Creatinine, Ser: 1.84 mg/dL — ABNORMAL HIGH (ref 0.44–1.00)
GFR, Estimated: 29 mL/min — ABNORMAL LOW (ref >60)
Glucose, Bld: 185 mg/dL — ABNORMAL HIGH (ref 70–99)
Potassium: 3.9 mmol/L (ref 3.5–5.1)
Sodium: 135 mmol/L (ref 135–145)

## 2021-07-29 LAB — GLUCOSE, CAPILLARY
Glucose-Capillary: 170 mg/dL — ABNORMAL HIGH (ref 70–99)
Glucose-Capillary: 234 mg/dL — ABNORMAL HIGH (ref 70–99)
Glucose-Capillary: 233 mg/dL — ABNORMAL HIGH (ref 70–99)
Glucose-Capillary: 227 mg/dL — ABNORMAL HIGH (ref 70–99)
Glucose-Capillary: 204 mg/dL — ABNORMAL HIGH (ref 70–99)

## 2021-07-29 MED ORDER — INSULIN ASPART 100 UNIT/ML IJ SOLN
0.0000 [IU] | Freq: Three times a day (TID) | INTRAMUSCULAR | Status: DC
  Administered 2021-07-29: 5 [IU] via SUBCUTANEOUS
  Administered 2021-07-30: 3 [IU] via SUBCUTANEOUS
  Administered 2021-07-30: 5 [IU] via SUBCUTANEOUS
  Administered 2021-07-30 – 2021-07-31 (×2): 3 [IU] via SUBCUTANEOUS
  Administered 2021-07-31: 5 [IU] via SUBCUTANEOUS
  Administered 2021-08-01: 3 [IU] via SUBCUTANEOUS
  Administered 2021-08-01: 5 [IU] via SUBCUTANEOUS

## 2021-07-29 MED ORDER — INSULIN ASPART 100 UNIT/ML IJ SOLN
0.0000 [IU] | Freq: Every day | INTRAMUSCULAR | Status: DC
  Administered 2021-07-29: 2 [IU] via SUBCUTANEOUS

## 2021-07-29 NOTE — Progress Notes (Signed)
PROGRESS NOTE  Tracey Bryant  ZOX:[id number] DOB: 08/04/1951 DOA: 07/26/2021 PCP: Coolidge Breeze, FNP   Brief Narrative: Tracey Bryant is a 70 y.o. female with a history of T2DM, PAF, DVT, PVD, CVA, HTN, acute cholecystitis s/p cholecystostomy March 2022 who presented to the ED at [facility] with increasing dyspnea, orthopnea, leg swelling, abdominal bloating and also found her drain dislodged just PTA. She was afebrile and hypoxic, with WBC 11.2k, hgb 10.1, BNP 1291, creatinine elevated near baseline at 1.78 and albumin low at 2.9. CXR revealed chronic cardiomegaly and left base opacity consistent with atelectasis with acute findings of interstitial edema and bilateral pleural effusions. She was admitted for acute on chronic HFrEF, cardiology consulted, and diuresis is ongoing.   Assessment & Plan: Principal Problem:   Acute on chronic combined systolic and diastolic congestive heart failure (HCC) Active Problems:   Type II diabetes mellitus (HCC)   Hypertension   Paroxysmal atrial fibrillation (HCC)   DVT (deep venous thrombosis) (HCC)   Normocytic anemia   Cholecystostomy tube dysfunction   Moderate protein-calorie malnutrition (HCC)   Acute on chronic HFrEF (heart failure with reduced ejection fraction) (HCC)  Acute hypoxic respiratory failure due to acute CHF, pleural effusions, atelectasis: Pt's SpO2 on room air at rest this morning was 87% with increased tachypnea during trial.  - Continue supplemental oxygen while diuresing, attempt to wean back to baseline (no O2) as able. Remains w/hypoxia  - Incentive spirometry.   Acute on chronic HFrEF, HTN: Has clinical evidence of overload, elevated BNP and pulmonary edema on CXR causing hypoxia. LVEF 25-30%, G2DD, decreased RV systolic function as well, severe LAE, pulmonary HTN (ASP 36 mmHg) by recent echo - Continue lasix 40mg  IV BID, 1.6L yesterday. Remains overloaded but Cr up so can't push diuresis. Continue to monitor weights, I/O. -  Outpatient weight May 2022 was 151lbs, currently well above that. - Continue coreg, norvasc. Increased coreg. Started bidil this admission with improvement in HTN. Not on ACE/ARB/ARNI due to CKD.   Cholecystitis s/p cholecystostomy: Dislodged PTA and replaced in IR 8/1.  - Drain output continues to be significant, 350cc/24hrs, exam benign. Continue monitoring output to gravity and routine wound care. - Routine exchange recommended in 3 months. If not planning surgery, IR plans to refer to [facility] for percutaneous management (spyglass)  IDT2DM with symptomatic hypoglycemia: HbA1c 9.1% earlier this year.  - At inpatient goal with reduced lantus dosing. Continue this in hopes of avoiding fasting hypoglycemia.  - CBGs otherwise elevated, will augment to moderate SSI, add HS correction.  - Continue hypoglycemia protocol.   Stage IIIa CKD:  - Bump in SCr noted. Avoid nephrotoxins, can't start entresto.  - Continue monitoring metabolic profile.   PAF s/p PPM:  - Continue eliquis - Continue coreg  - Supp K and Mg with ongoing diuresis.   Left popliteal DVT: Dx April 2022 - Continue anticoagulation  History of CVAs: April 2022. - Continue eliquis  - Continue zetia  DVT prophylaxis: Eliquis Code Status: Full Family Communication: None at bedside. Cardiology did get a hold of Son by phone yesterday afternoon. Disposition Plan:  Status is: Inpatient. Pt newly hypoxic from acute CHF.  Remains inpatient appropriate because:Inpatient level of care appropriate due to severity of illness  Dispo: The patient is from: Home              Anticipated d/c is to: Home              Patient currently is  not medically stable to d/c.   Difficult to place patient No  Consultants:  IR Cardiology  Procedures:  Image-guided rescue of displaced perc chole 33F pigtail drain by Dr. Earleen Newport 8/1  Antimicrobials: None   Subjective: Still short of breath, started feeling dizzy while walking yesterday,  though also felt her legs were weak specifically. Symptoms abated once back in bed. No hypotension noted. No chest pain. Leg swelling not significantly changed per pt.  Objective: Vitals:   07/29/21 0420 07/29/21 0642 07/29/21 0802 07/29/21 1046  BP: (!) 122/56 (!) 128/58 (!) 108/52 (!) 112/50  Pulse: 64 64 61 60  Resp: 16  14 18   Temp: 99.2 F (37.3 C)  97.9 F (36.6 C) 97.6 F (36.4 C)  TempSrc: Oral  Oral Oral  SpO2: 94%  95% 95%  Weight: 85.7 kg     Height:        Intake/Output Summary (Last 24 hours) at 07/29/2021 1426 Last data filed at 07/29/2021 0820 Gross per 24 hour  Intake 200 ml  Output 1705 ml  Net -1505 ml   Filed Weights   07/26/21 1510 07/28/21 0335 07/29/21 0420  Weight: 78.5 kg 86.1 kg 85.7 kg   Gen: 70 y.o. female in no distress Pulm: Nonlabored breathing supplemental oxygen, some crackles. CV: Regular rate and rhythm. No murmur, rub, or gallop. No definite JVD, stable 1+ dependent edema. GI: Abdomen soft, non-tender, non-distended, with normoactive bowel sounds.  Ext: Warm, no deformities Skin: No new rashes, lesions or ulcers on visualized skin. Neuro: Alert and oriented. No focal neurological deficits. Psych: Judgement and insight appear fair. Mood euthymic & affect congruent. Behavior is appropriate.    Data Reviewed: I have personally reviewed following labs and imaging studies  CBC: Recent Labs  Lab 07/26/21 1601 07/27/21 0354 07/27/21 1418  WBC 11.3* 10.6* 10.3  NEUTROABS 8.3*  --   --   HGB 10.1* 10.1* 10.3*  HCT 32.4* 32.2* 32.4*  MCV 96.7 95.3 91.8  PLT 452* 475* 528   Basic Metabolic Panel: Recent Labs  Lab 07/26/21 1601 07/27/21 1418 07/29/21 0024  NA 138 136 135  K 4.0 3.9 3.9  CL 111 106 101  CO2 20* 21* 24  GLUCOSE 78 156* 185*  BUN 23 18 20   CREATININE 1.78* 1.66* 1.84*  CALCIUM 8.4* 8.4* 8.3*  MG  --  1.8  --    GFR: Estimated Creatinine Clearance: 31.8 mL/min (A) (by C-G formula based on SCr of 1.84 mg/dL  (H)). Liver Function Tests: Recent Labs  Lab 07/26/21 1601  AST 17  ALT 10  ALKPHOS 54  BILITOT 0.9  PROT 6.5  ALBUMIN 2.9*   Recent Labs  Lab 07/26/21 1601  LIPASE 31   No results for input(s): AMMONIA in the last 168 hours. Coagulation Profile: No results for input(s): INR, PROTIME in the last 168 hours. Cardiac Enzymes: No results for input(s): CKTOTAL, CKMB, CKMBINDEX, TROPONINI in the last 168 hours. BNP (last 3 results) No results for input(s): PROBNP in the last 8760 hours. HbA1C: No results for input(s): HGBA1C in the last 72 hours. CBG: Recent Labs  Lab 07/28/21 1058 07/28/21 1612 07/28/21 2123 07/29/21 0611 07/29/21 1048  GLUCAP 169* 214* 188* 170* 234*   Lipid Profile: No results for input(s): CHOL, HDL, LDLCALC, TRIG, CHOLHDL, LDLDIRECT in the last 72 hours. Thyroid Function Tests: No results for input(s): TSH, T4TOTAL, FREET4, T3FREE, THYROIDAB in the last 72 hours. Anemia Panel: No results for input(s): VITAMINB12, FOLATE, FERRITIN, TIBC, IRON,  RETICCTPCT in the last 72 hours. Urine analysis:    Component Value Date/Time   COLORURINE AMBER (A) 03/17/2021 1208   APPEARANCEUR CLOUDY (A) 03/17/2021 1208   LABSPEC 1.015 03/17/2021 1208   PHURINE 5.0 03/17/2021 1208   GLUCOSEU 50 (A) 03/17/2021 1208   HGBUR SMALL (A) 03/17/2021 1208   BILIRUBINUR NEGATIVE 03/17/2021 [place] 03/17/2021 1208   PROTEINUR >=300 (A) 03/17/2021 1208   UROBILINOGEN 0.2 01/17/2013 1910   NITRITE NEGATIVE 03/17/2021 1208   LEUKOCYTESUR LARGE (A) 03/17/2021 1208   Recent Results (from the past 240 hour(s))  Resp Panel by RT-PCR (Flu A&B, Covid) Nasopharyngeal Swab     Status: None   Collection Time: 07/26/21  4:02 PM   Specimen: Nasopharyngeal Swab; Nasopharyngeal(NP) swabs in vial transport medium  Result Value Ref Range Status   SARS Coronavirus 2 by RT PCR NEGATIVE NEGATIVE Final    Comment: (NOTE) SARS-CoV-2 target nucleic acids are NOT  DETECTED.  The SARS-CoV-2 RNA is generally detectable in upper respiratory specimens during the acute phase of infection. The lowest concentration of SARS-CoV-2 viral copies this assay can detect is 138 copies/mL. A negative result does not preclude SARS-Cov-2 infection and should not be used as the sole basis for treatment or other patient management decisions. A negative result may occur with  improper specimen collection/handling, submission of specimen other than nasopharyngeal swab, presence of viral mutation(s) within the areas targeted by this assay, and inadequate number of viral copies(<138 copies/mL). A negative result must be combined with clinical observations, patient history, and epidemiological information. The expected result is Negative.  Fact Sheet for Patients:  EntrepreneurPulse.com.au  Fact Sheet for Healthcare Providers:  IncredibleEmployment.be  This test is no t yet approved or cleared by the [place] FDA and  has been authorized for detection and/or diagnosis of SARS-CoV-2 by FDA under an Emergency Use Authorization (EUA). This EUA will remain  in effect (meaning this test can be used) for the duration of the COVID-19 declaration under Section 564(b)(1) of the Act, 21 U.S.C.section 360bbb-3(b)(1), unless the authorization is terminated  or revoked sooner.       Influenza A by PCR NEGATIVE NEGATIVE Final   Influenza B by PCR NEGATIVE NEGATIVE Final    Comment: (NOTE) The Xpert Xpress SARS-CoV-2/FLU/RSV plus assay is intended as an aid in the diagnosis of influenza from Nasopharyngeal swab specimens and should not be used as a sole basis for treatment. Nasal washings and aspirates are unacceptable for Xpert Xpress SARS-CoV-2/FLU/RSV testing.  Fact Sheet for Patients: EntrepreneurPulse.com.au  Fact Sheet for Healthcare Providers: IncredibleEmployment.be  This test is not yet  approved or cleared by the [place] FDA and has been authorized for detection and/or diagnosis of SARS-CoV-2 by FDA under an Emergency Use Authorization (EUA). This EUA will remain in effect (meaning this test can be used) for the duration of the COVID-19 declaration under Section 564(b)(1) of the Act, 21 U.S.C. section 360bbb-3(b)(1), unless the authorization is terminated or revoked.  Performed at [facility], [address] [place], [place] [address]   MRSA Next Gen by PCR, Nasal     Status: None   Collection Time: 07/27/21  1:16 AM   Specimen: Nasal Mucosa; Nasal Swab  Result Value Ref Range Status   MRSA by PCR Next Gen NOT DETECTED NOT DETECTED Final    Comment: (NOTE) The GeneXpert MRSA Assay (FDA approved for NASAL specimens only), is one component of a comprehensive MRSA colonization surveillance program. It is not  intended to diagnose MRSA infection nor to guide or monitor treatment for MRSA infections. Test performance is not FDA approved in patients less than 23 years old. Performed at [facility], [place] [address] [place], [place] [address]       Radiology Studies: No results found.  Scheduled Meds:  apixaban  5 mg Oral BID   carvedilol  6.25 mg Oral BID WC   ezetimibe  10 mg Oral Daily   furosemide  40 mg Intravenous BID   insulin aspart  0-9 Units Subcutaneous TID WC   insulin glargine-yfgn  10 Units Subcutaneous QHS   isosorbide-hydrALAZINE  1 tablet Oral TID   mouth rinse  15 mL Mouth Rinse BID   tamsulosin  0.4 mg Oral q morning   Continuous Infusions:     LOS: 2 days   Time spent: 35 minutes.  Patrecia Pour, MD [facility] [email or web address] 07/29/2021, 2:26 PM

## 2021-07-29 NOTE — Progress Notes (Signed)
Heart Failure Stewardship Pharmacist Progress Note   PCP: Coolidge Breeze, FNP PCP-Cardiologist: None    HPI:  70 yo F with PMH of CHF, T2DM, afib, DVT, acute cholecystitis s/p cholecystostomy, and HTN. She presented to the ED on 7/31 with shortness of breath, orthopnea, abdominal distention, and LE edema. CXR on admission with cardiomegaly, worsened interstitial edema, and probably small bilateral pleural effusions. Last ECHO was done on 03/18/21 and LVEF was 25-30% with G2DD.  Current HF Medications: Furosemide 40 mg IV BID Carvedilol 6.25 mg BID BiDil 20/37.5 mg 1 tab TID  Prior to admission HF Medications: Furosemide 20 mg daily Carvedilol 3.125 mg BID  Pertinent Lab Values: Serum creatinine 1.84, BUN 20, Potassium 3.9, Sodium 135, BNP 1291.0  Vital Signs: Weight: 188 lbs (admission weight: 173 lbs although question if this was estimated weight) Blood pressure: 120/70s  Heart rate: 60s   Medication Assistance / Insurance Benefits Check: Does the patient have prescription insurance?  Yes Type of insurance plan: [name] [employer]  Does the patient qualify for medication assistance through manufacturers or grants? Pending  Eligible grants and/or patient assistance programs: pending Medication assistance applications in progress: none  Medication assistance applications approved: none Approved medication assistance renewals will be completed by: pending  Outpatient Pharmacy:  Prior to admission outpatient pharmacy: [facility] Is the patient willing to use [place] pharmacy at discharge? Yes Is the patient willing to transition their outpatient pharmacy to utilize a [facility] outpatient pharmacy?   Pending    Assessment: 1. Acute on chronic systolic CHF (EF 18-40%). NYHA class III symptoms. - Continue furosemide 40 mg IV BID. Moderate edema on MD exam. Bump in SCr today. 1.2L out yesterday.  - Continue carvedilol 6.25 mg BID - Consider starting Entresto 24/26 mg BID if renal  function improves - Consider starting spironolactone and SGTL2i prior to discharge - Continue BiDil 1 tab TID   Plan: 1) Medication changes recommended at this time: - Continue current regimen  2) Patient assistance: - Entresto copay $47 per month - Farxiga/Jardiance copay $47 per month - BiDil copay $109.77 per month ($40 per month if broken up to hydralazine + Imdur) - Can help enroll in PAN grant or patient assistance to lower copays to $0 per month  3)  Education  - To be completed prior to discharge  Kerby Nora, PharmD, BCPS Heart Failure Cytogeneticist Phone [phone]

## 2021-07-29 NOTE — Progress Notes (Signed)
Mobility Specialist: Progress Note   07/29/21 1551  Mobility  Activity Ambulated in hall  Level of Assistance Contact guard assist, steadying assist  Assistive Device Four wheel walker  Distance Ambulated (ft) 100 ft (50'x2)  Mobility Ambulated with assistance in hallway  Mobility Response Tolerated well  Mobility performed by Mobility specialist  Bed Position Chair  $Mobility charge 1 Mobility   Pre-Mobility: 71 HR, 129/60 BP, 97% SpO2 During Mobility: 84 HR, 96% SpO2 Post-Mobility: 66 HR, 120/94 BP, 98% SpO2  Pt c/o her legs feeling a little weak today but agreeable to ambulation. Pt took one seated break lasting roughly one minute d/t fatigue and SOB, VSS. Pt to the recliner after walk with call bell and phone at her side.   [facility] Jervis Trapani Mobility Specialist Mobility Specialist Phone: [phone]

## 2021-07-29 NOTE — TOC Progression Note (Signed)
Transition of Care [facility]) - Progression Note    Patient Details  Name: Tracey Bryant MRN: [id number] Date of Birth: December 15, 1951  Transition of [facility]) CM/SW Contact  Zenon Mayo, RN Phone Number: 07/29/2021, 3:45 PM  Clinical Narrative:    NCM spoke with patient at bedside, discussed setting [facility] for COPD and CHF, she states she does not want HHRN.  She states she does have a bp cuff to check her bp with but the scale she has is new and it does not work, so she has not been able to check her weight.  She states she consumes a low sodium diet.  She states she will have transportation at dc. She states her son takes her to her MD apts and anywhere she needs to go.  TOC will continue to follow for dc needs.        Expected Discharge Plan and Services                                                 Social Determinants of Health (SDOH) Interventions    Readmission Risk Interventions Readmission Risk Prevention Plan 03/19/2021 03/17/2021  Transportation Screening - Complete  PCP or Specialist Appt within 5-7 Days Complete -  Home Care Screening Complete -  Medication Review (RN CM) - Complete  Some recent data might be hidden

## 2021-07-29 NOTE — Progress Notes (Signed)
Progress Note  Patient Name: Tracey Bryant Date of Encounter: 07/29/2021  Primary Cardiologist:   None   Subjective   She felt light headed with ambulation yesterday.  However, BP and HR were OK.  She really described leg weakness.     Inpatient Medications    Scheduled Meds:  apixaban  5 mg Oral BID   carvedilol  6.25 mg Oral BID WC   ezetimibe  10 mg Oral Daily   furosemide  40 mg Intravenous BID   insulin aspart  0-9 Units Subcutaneous TID WC   insulin glargine-yfgn  10 Units Subcutaneous QHS   isosorbide-hydrALAZINE  1 tablet Oral TID   mouth rinse  15 mL Mouth Rinse BID   tamsulosin  0.4 mg Oral q morning   Continuous Infusions:   PRN Meds: acetaminophen **OR** acetaminophen, ondansetron **OR** ondansetron (ZOFRAN) IV, senna-docusate   Vital Signs    Vitals:   07/28/21 1950 07/28/21 2357 07/29/21 0420 07/29/21 0642  BP: (!) 143/58 132/61 (!) 122/56 (!) 128/58  Pulse: 68 68 64 64  Resp: 20 20 16    Temp: 98 F (36.7 C) 98.1 F (36.7 C) 99.2 F (37.3 C)   TempSrc: Oral Oral Oral   SpO2: 91% 94% 94%   Weight:   85.7 kg   Height:        Intake/Output Summary (Last 24 hours) at 07/29/2021 0802 Last data filed at 07/29/2021 0355 Gross per 24 hour  Intake 200 ml  Output 1450 ml  Net -1250 ml   Filed Weights   07/26/21 1510 07/28/21 0335 07/29/21 0420  Weight: 78.5 kg 86.1 kg 85.7 kg    Telemetry    NSR, atrial pacing - Personally Reviewed  ECG    NA - Personally Reviewed  Physical Exam   GEN: No  acute distress.   Neck: No  JVD Cardiac: RRR, no murmurs, rubs, or gallops.  Respiratory: Clear   to auscultation bilaterally. GI: Soft, nontender, non-distended, normal bowel sounds  MS:  Moderate edema; No deformity. Neuro:   Nonfocal  Psych: Oriented and appropriate    Labs    Chemistry Recent Labs  Lab 07/26/21 1601 07/27/21 1418 07/29/21 0024  NA 138 136 135  K 4.0 3.9 3.9  CL 111 106 101  CO2 20* 21* 24  GLUCOSE 78 156* 185*  BUN  23 18 20   CREATININE 1.78* 1.66* 1.84*  CALCIUM 8.4* 8.4* 8.3*  PROT 6.5  --   --   ALBUMIN 2.9*  --   --   AST 17  --   --   ALT 10  --   --   ALKPHOS 54  --   --   BILITOT 0.9  --   --   GFRNONAA 31* 33* 29*  ANIONGAP 7 9 10      Hematology Recent Labs  Lab 07/26/21 1601 07/27/21 0354 07/27/21 1418  WBC 11.3* 10.6* 10.3  RBC 3.35* 3.38* 3.53*  HGB 10.1* 10.1* 10.3*  HCT 32.4* 32.2* 32.4*  MCV 96.7 95.3 91.8  MCH 30.1 29.9 29.2  MCHC 31.2 31.4 31.8  RDW 17.6* 17.4* 17.3*  PLT 452* 475* 322    Cardiac EnzymesNo results for input(s): TROPONINI in the last 168 hours. No results for input(s): TROPIPOC in the last 168 hours.   BNP Recent Labs  Lab 07/26/21 1601  BNP 1,291.0*     DDimer No results for input(s): DDIMER in the last 168 hours.   Radiology    No results  found.  Cardiac Studies     Echocardiogram from 03/18/2021:   1. Left ventricular ejection fraction, by estimation, is 25 to 30%. The  left ventricle has normal function. The left ventricle demonstrates global  hypokinesis. There is mild left ventricular hypertrophy. Left ventricular  diastolic parameters are  consistent with Grade II diastolic dysfunction (pseudonormalization).  Elevated left atrial pressure.   2. RV poorly visualized, grossly appears enlarged with decreased systolic  function. . Right ventricular systolic function was not well visualized.  The right ventricular size is not well visualized.   3. Left atrial size was severely dilated.   4. The mitral valve is normal in structure. No evidence of mitral valve  regurgitation. No evidence of mitral stenosis.   5. The aortic valve is tricuspid. There is mild calcification of the  aortic valve. There is mild thickening of the aortic valve. Aortic valve  regurgitation is not visualized. No aortic stenosis is present.   6. Mild pulmonary HTN, PASP is 36 mmHg.   7. The inferior vena cava is normal in size with greater than 50%   respiratory variability, suggesting right atrial pressure of 3 mmHg.    Patient Profile     70 y.o. 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 Bonner Puna.   Assessment & Plan    ACUTE ON CHRONIC SYSTOLIC AND DIASTOLIC HF:     Net negative 4.6 liters.  I would continue the IV diuretic today despite the mildly increased creat.  Continue other meds as listed.   CKD IIIB: Creat is stable.  No change in therapy.   ATRIAL FIB:    No recent events.  Tolerates anticoagulatoin.   HTN:   This is being managed in the context of treating his CHF   For questions or updates, please contact [facility] Please consult [email or web address] for contact info under Cardiology/STEMI.   Signed, Minus Breeding, MD  07/29/2021, 8:02 AM

## 2021-07-30 LAB — GLUCOSE, CAPILLARY
Glucose-Capillary: 157 mg/dL — ABNORMAL HIGH (ref 70–99)
Glucose-Capillary: 198 mg/dL — ABNORMAL HIGH (ref 70–99)
Glucose-Capillary: 205 mg/dL — ABNORMAL HIGH (ref 70–99)
Glucose-Capillary: 154 mg/dL — ABNORMAL HIGH (ref 70–99)

## 2021-07-30 LAB — BASIC METABOLIC PANEL
Anion gap: 8 (ref 5–15)
BUN: 28 mg/dL — ABNORMAL HIGH (ref 8–23)
CO2: 23 mmol/L (ref 22–32)
Calcium: 8.3 mg/dL — ABNORMAL LOW (ref 8.9–10.3)
Chloride: 100 mmol/L (ref 98–111)
Creatinine, Ser: 2.48 mg/dL — ABNORMAL HIGH (ref 0.44–1.00)
GFR, Estimated: 21 mL/min — ABNORMAL LOW (ref >60)
Glucose, Bld: 188 mg/dL — ABNORMAL HIGH (ref 70–99)
Potassium: 4.4 mmol/L (ref 3.5–5.1)
Sodium: 131 mmol/L — ABNORMAL LOW (ref 135–145)

## 2021-07-30 NOTE — Evaluation (Signed)
Physical Therapy Evaluation Patient Details Name: Tracey Bryant MRN: [id number] DOB: 02-16-1951 Today's Date: 07/30/2021   History of Present Illness  70 y.o. female admitted 7/31 with acute on chronic combined systolic and diastolic CHF. 8/1 cholecystectomy tube replacement. PMHx:T2DM, PAF, PPM, DVT, PVD, CVA, HTN, acute cholecystitis s/p cholecystostomy March 2022.  Clinical Impression  Pt very pleasant and wanting to move and get stronger to return home. Pt lives alone with daughter next door who has a 4 mo. Pt currently requires physical assist with standing and gait due to instability and what can only be described as presyncope/"melting" in standing during gait with VSS. Pt could potentially return home if mobility improved and no longer with partial fall/buckling/presyncope with activity otherwise SNF recommended for pt safety. Pt with decreased strength, transfers, gait and function who will benefit from acute therapy to maximize independence, safety and decrease fall risk.  Supine 126/61 (80) HR 61, SpO2 96% RA Sitting 111/71 (84) Standing 118/61 (79) Seated after 2nd gait trial 124/66 (84) HR 71, SpO2 92% RA    Follow Up Recommendations SNF;Supervision for mobility/OOB    Equipment Recommendations  None recommended by PT    Recommendations for Other Services       Precautions / Restrictions Precautions Precautions: Fall Restrictions Weight Bearing Restrictions: No      Mobility  Bed Mobility Overal bed mobility: Modified Independent                  Transfers Overall transfer level: Needs assistance   Transfers: Sit to/from Stand Sit to Stand: Min assist         General transfer comment: min assist for initial stand from bed with pt with instability in rising. Rise from chair with guarding and cues for hand placement. Sitting to chair with min then mod assist second trial.  Ambulation/Gait Ambulation/Gait assistance: Min assist;+2  safety/equipment Gait Distance (Feet): 90 Feet Assistive device: Rolling walker (2 wheeled) Gait Pattern/deviations: Step-through pattern;Decreased stride length   Gait velocity interpretation: <1.8 ft/sec, indicate of risk for recurrent falls General Gait Details: pt with partial wobbling of head and knees with initial gait with close guarding and assist for balance with pt able to walk 90' but needs cues to attend to fatigue and gait tolerance. After seated rest pt walked additional 30' and began to "melt" into therapist with nearly presyncopal appearance requiring mod assist to sit into chair to prevent fall with vital signs stable and pt able to maintain attention with initial difficulty maintaining eyes open.  Stairs            Wheelchair Mobility    Modified Rankin (Stroke Patients Only)       Balance Overall balance assessment: Needs assistance Sitting-balance support: No upper extremity supported;Feet supported Sitting balance-Leahy Scale: Fair Sitting balance - Comments: EOB without support   Standing balance support: Bilateral upper extremity supported Standing balance-Leahy Scale: Poor Standing balance comment: bil UE support on RW and assist for safety/balance                             Pertinent Vitals/Pain Pain Assessment: No/denies pain    Home Living Family/patient expects to be discharged to:: Private residence Living Arrangements: Alone Available Help at Discharge: Family;Available 24 hours/day Type of Home: House Home Access: Stairs to enter Entrance Stairs-Rails: Can reach both Entrance Stairs-Number of Steps: 3 Home Layout: One level Home Equipment: Walker - 4 wheels;Shower seat;Bedside commode;Walker -  2 wheels;Cane - single point Additional Comments: uses the rollator to walk to daughters house, but uses cane and RW to get around the house    Prior Function Level of Independence: Independent with assistive device(s)          Comments: denies falls     Hand Dominance   Dominant Hand: Right    Extremity/Trunk Assessment   Upper Extremity Assessment Upper Extremity Assessment: Generalized weakness    Lower Extremity Assessment Lower Extremity Assessment: Generalized weakness    Cervical / Trunk Assessment Cervical / Trunk Assessment: Kyphotic  Communication   Communication: No difficulties  Cognition Arousal/Alertness: Awake/alert Behavior During Therapy: WFL for tasks assessed/performed Overall Cognitive Status: Within Functional Limits for tasks assessed                                 General Comments: pt able to answer all questions appropriately but lacks awareness of safety/deficits with "melting" during gait      General Comments      Exercises     Assessment/Plan    PT Assessment Patient needs continued PT services  PT Problem List Decreased strength;Decreased mobility;Decreased safety awareness;Decreased activity tolerance;Decreased balance;Decreased knowledge of use of DME;Decreased cognition       PT Treatment Interventions Gait training;Balance training;Stair training;Functional mobility training;Therapeutic activities;Patient/family education;Therapeutic exercise;DME instruction    PT Goals (Current goals can be found in the Care Plan section)  Acute Rehab PT Goals Patient Stated Goal: return home PT Goal Formulation: With patient Time For Goal Achievement: 08/13/21 Potential to Achieve Goals: Fair    Frequency Min 3X/week   Barriers to discharge Decreased caregiver support      Co-evaluation               AM-PAC PT "6 Clicks" Mobility  Outcome Measure Help needed turning from your back to your side while in a flat bed without using bedrails?: A Little Help needed moving from lying on your back to sitting on the side of a flat bed without using bedrails?: A Little Help needed moving to and from a bed to a chair (including a wheelchair)?: A  Little Help needed standing up from a chair using your arms (e.g., wheelchair or bedside chair)?: A Little Help needed to walk in hospital room?: Total Help needed climbing 3-5 steps with a railing? : Total 6 Click Score: 14    End of Session Equipment Utilized During Treatment: Gait belt Activity Tolerance: Patient limited by fatigue Patient left: in chair;with call bell/phone within reach;with chair alarm set Nurse Communication: Mobility status PT Visit Diagnosis: Other abnormalities of gait and mobility (R26.89);Difficulty in walking, not elsewhere classified (R26.2);Muscle weakness (generalized) (M62.81)    Time: 0932-3557 PT Time Calculation (min) (ACUTE ONLY): 42 min   Charges:   PT Evaluation $PT Eval Moderate Complexity: 1 Mod PT Treatments $Gait Training: 8-22 mins $Therapeutic Activity: 8-22 mins        Dava Rensch P, PT Acute Rehabilitation Services Pager: [phone] Office: [place] 07/30/2021, 9:57 AM

## 2021-07-30 NOTE — Progress Notes (Signed)
Inpatient Diabetes Program Recommendations  AACE/ADA: New Consensus Statement on Inpatient Glycemic Control   Target Ranges:  Prepandial:   less than 140 mg/dL      Peak postprandial:   less than 180 mg/dL (1-2 hours)      Critically ill patients:  140 - 180 mg/dL   Results for DESTONY, PREVOST (MRN [id number]) as of 07/30/2021 11:01  Ref. Range 07/29/2021 06:11 07/29/2021 10:48 07/29/2021 15:23 07/29/2021 18:06 07/29/2021 21:05 07/30/2021 06:03  Glucose-Capillary Latest Ref Range: 70 - 99 mg/dL 170 (H) 234 (H) 233 (H) 227 (H) 204 (H) 157 (H)    Review of Glycemic Control  Diabetes history: DM2 Outpatient Diabetes medications: Lantus 25 units QHS, Novolog 0-8 units TID Current orders for Inpatient glycemic control: Semglee 10 units QHS, Novolog 0-15 units TID with meals, Novolog 0-5 units QHS  Inpatient Diabetes Program Recommendations:    Insulin: Please consider ordering Novolog 3 units TID with meals for meal coverage if patient eats at least 50% of meals.  Thanks, Barnie Alderman, RN, MSN, CDE Diabetes Coordinator Inpatient Diabetes Program [phone] (Team Pager from 8am to 5pm)

## 2021-07-30 NOTE — Evaluation (Addendum)
Occupational Therapy Evaluation Patient Details Name: Tracey Bryant MRN: [id number] DOB: [birth year] Today's Date: 07/30/2021    History of Present Illness 70 y.o. female admitted 7/31 with acute on chronic combined systolic and diastolic CHF. 8/1 cholecystectomy tube replacement. PMHx:T2DM, PAF, PPM, DVT, PVD, CVA, HTN, acute cholecystitis s/p cholecystostomy March 2022.   Clinical Impression   PTA, pt lives alone and reports Modified Independence with ADLs and mobility. Pt's daughter lives next door and assists with IADLs as needed. Presently, pt does endorse weakness though feels balance and endurance has improved since initial admission. Pt able to demo mobility using RW at min guard, UB ADLs with setup assist and LB ADLs with min guard assist. Pt with desire to return home rather than rehab with plans to stay with daughter initially to maximize safety. Encouraged pt to have supervision for ADLs and mobility, especially showering tasks to decrease fall risk. Based on current presentations, would recommend HHOT follow-up.Plan to follow acutely to ensure ADL/mobility progress consistent and to reduce fall risk.  BP pre-activity: 123/63 BP post-activity: 134/69 HR and SpO2 WFL on RA    Follow Up Recommendations  Home health OT (direct supervision for ADLs/mobility initially)    Equipment Recommendations  None recommended by OT (appears well equipped)    Recommendations for Other Services       Precautions / Restrictions Precautions Precautions: Fall;Other (comment) Precaution Comments: hx of syncopal episodes Restrictions Weight Bearing Restrictions: No      Mobility Bed Mobility Overal bed mobility: Modified Independent                  Transfers Overall transfer level: Needs assistance Equipment used: Rolling walker (2 wheeled) Transfers: Sit to/from Stand Sit to Stand: Min guard         General transfer comment: min guard for safety, appropriate hand  placement noted    Balance Overall balance assessment: Needs assistance Sitting-balance support: No upper extremity supported;Feet supported Sitting balance-Leahy Scale: Good     Standing balance support: Bilateral upper extremity supported Standing balance-Leahy Scale: Fair Standing balance comment: fair static standing for clothing mgmt, use of BUE for mobility                           ADL either performed or assessed with clinical judgement   ADL Overall ADL's : Needs assistance/impaired Eating/Feeding: Independent;Sitting   Grooming: Min guard;Standing   Upper Body Bathing: Set up;Sitting   Lower Body Bathing: Min guard;Sit to/from stand   Upper Body Dressing : Set up;Sitting   Lower Body Dressing: Min guard;Sit to/from stand   Toilet Transfer: Min guard;Ambulation;RW;BSC Toilet Transfer Details (indicate cue type and reason): to take steps to [facility] in room Toileting- Clothing Manipulation and Hygiene: Min guard;Sit to/from stand Toileting - Clothing Manipulation Details (indicate cue type and reason): min guard for clothing mgmt in standing, able to perform hygiene in standing     Functional mobility during ADLs: Min guard;Rolling walker General ADL Comments: Pt with deficits in strength though improving mobility, balance and endurance per pt and mobility specialist     Vision Baseline Vision/History: Legally blind;Wears glasses Wears Glasses: At all times Patient Visual Report: No change from baseline Vision Assessment?: No apparent visual deficits     Perception     Praxis      Pertinent Vitals/Pain Pain Assessment: No/denies pain     Hand Dominance Right   Extremity/Trunk Assessment Upper Extremity Assessment Upper Extremity Assessment: Generalized  weakness   Lower Extremity Assessment Lower Extremity Assessment: Defer to PT evaluation       Communication Communication Communication: No difficulties   Cognition Arousal/Alertness:  Awake/alert Behavior During Therapy: WFL for tasks assessed/performed Overall Cognitive Status: Within Functional Limits for tasks assessed                                     General Comments  After toileting tasks, mobility tech entering room for hallway mobility in which pt was able to complete throughout unit using RW, one minor LOB but able to self correct    Exercises     Shoulder Instructions      Home Living Family/patient expects to be discharged to:: Private residence Living Arrangements: Alone Available Help at Discharge: Family;Available 24 hours/day Type of Home: House Home Access: Stairs to enter [employer] of Steps: 3 Entrance Stairs-Rails: Can reach both Home Layout: One level     Bathroom Shower/Tub: Teacher, early years/pre: Standard     Home Equipment: Environmental consultant - 4 wheels;Shower seat;Bedside commode;Walker - 2 wheels;Cane - single point   Additional Comments: reports she plans to go stay with her daughter at [place]      Prior Functioning/Environment Level of Independence: Independent with assistive device(s)        Comments: denies falls, use of RW vs cane for mobility typically. Will use Rollator to walk to daughter's home (next door). was completing ADLs, assist with IADLs initially. reports progressing well at home and able to cut her own hair, do laundry, etc        OT Problem List: Decreased strength;Decreased activity tolerance;Impaired balance (sitting and/or standing)      OT Treatment/Interventions: Self-care/ADL training;Therapeutic exercise;Energy conservation;DME and/or AE instruction;Therapeutic activities;Patient/family education;Balance training    OT Goals(Current goals can be found in the care plan section) Acute Rehab OT Goals Patient Stated Goal: go home OT Goal Formulation: With patient Time For Goal Achievement: 08/13/21 Potential to Achieve Goals: Good ADL Goals Pt Will Perform Grooming: with  modified independence;standing Pt Will Perform Lower Body Dressing: with modified independence;sit to/from stand;sitting/lateral leans Pt Will Transfer to Toilet: with modified independence;ambulating Pt/caregiver will Perform Home Exercise Program: Increased strength;Both right and left upper extremity;With theraband;Independently;With written HEP provided  OT Frequency: Min 2X/week   Barriers to D/C:            Co-evaluation              AM-PAC OT "6 Clicks" Daily Activity     Outcome Measure Help from another person eating meals?: None Help from another person taking care of personal grooming?: A Little Help from another person toileting, which includes using toliet, bedpan, or urinal?: A Little Help from another person bathing (including washing, rinsing, drying)?: A Little Help from another person to put on and taking off regular upper body clothing?: A Little Help from another person to put on and taking off regular lower body clothing?: A Little 6 Click Score: 19   End of Session Equipment Utilized During Treatment: Gait belt;Rolling walker Nurse Communication: Mobility status  Activity Tolerance: Patient tolerated treatment well Patient left: in bed;with call bell/phone within reach;with bed alarm set  OT Visit Diagnosis: Unsteadiness on feet (R26.81);Muscle weakness (generalized) (M62.81)                Time: 3474-2595 OT Time Calculation (min): 33 min Charges:  OT General  Charges $OT Visit: 1 Visit OT Evaluation $OT Eval Moderate Complexity: 1 Mod OT Treatments $Self Care/Home Management : 8-22 mins  Malachy Chamber, OTR/L Acute Rehab Services Office: [phone]   [name] 07/30/2021, 2:45 PM

## 2021-07-30 NOTE — Progress Notes (Signed)
PROGRESS NOTE  Rheana D Drapeau  JJH:[id number] DOB: April 26, 1951 DOA: 07/26/2021 PCP: Coolidge Breeze, FNP   Brief Narrative: Tracey Bryant is a 70 y.o. female with a history of T2DM, PAF, DVT, PVD, CVA, HTN, acute cholecystitis s/p cholecystostomy March 2022 who presented to the ED at [facility] with increasing dyspnea, orthopnea, leg swelling, abdominal bloating and also found her drain dislodged just PTA. She was afebrile and hypoxic, with WBC 11.2k, hgb 10.1, BNP 1291, creatinine elevated near baseline at 1.78 and albumin low at 2.9. CXR revealed chronic cardiomegaly and left base opacity consistent with atelectasis with acute findings of interstitial edema and bilateral pleural effusions. She was admitted for acute on chronic HFrEF, cardiology consulted, and diuresis is ongoing. AKI developed 8/4 for which diuretic is held.  Assessment & Plan: Principal Problem:   Acute on chronic combined systolic and diastolic congestive heart failure (HCC) Active Problems:   Type II diabetes mellitus (HCC)   Hypertension   Paroxysmal atrial fibrillation (HCC)   DVT (deep venous thrombosis) (HCC)   Normocytic anemia   Cholecystostomy tube dysfunction   Moderate protein-calorie malnutrition (HCC)   Acute on chronic HFrEF (heart failure with reduced ejection fraction) (HCC)  Acute hypoxic respiratory failure due to acute CHF, pleural effusions, atelectasis: Pt's SpO2 on room air at rest this morning was 87% with increased tachypnea during trial.  - Continue supplemental oxygen while diuresing, attempt to wean back to baseline (no O2) as able. Remains w/hypoxia. Check ambulatory pulse oximetry today.  - Incentive spirometry.   Acute on chronic HFrEF, HTN: Has clinical evidence of overload, elevated BNP and pulmonary edema on CXR causing hypoxia. LVEF 25-30%, G2DD, decreased RV systolic function as well, severe LAE, pulmonary HTN (ASP 36 mmHg) by recent echo - With SCr rise, will have to hold lasix for now,  though she remains hypervolemic. Appreciate cardiology recommendations.  - Outpatient weight May 2022 was 151lbs, currently well above that. Unclear accuracy of weights. UOP similarly not completely documented. - Continue coreg, norvasc. Increased coreg. Started bidil this admission with improvement in HTN. Not on ACE/ARB/ARNI due to CKD.  - Follow up with cardiology in [place] has been arranged for 8/24.   Cholecystitis s/p cholecystostomy: Dislodged PTA and replaced in IR 8/1.  - Drain output continues to be significant, 385cc/24hrs, exam benign. Continue monitoring output to gravity and routine wound care. - Routine exchange recommended in 3 months. If not planning surgery, IR plans to refer to [facility] for percutaneous management (spyglass)  IDT2DM with symptomatic hypoglycemia: HbA1c 9.1% earlier this year.  - At inpatient goal with reduced lantus dosing.   - Continue mod SSI, HS correction. - Continue hypoglycemia protocol.   AKI on stage IIIa CKD: Due to diuresis.  - Hold diuretic, can't give entresto.  - Continue monitoring metabolic profile.   PAF s/p PPM:  - Continue eliquis (does not qualify for reduced/renal dosing with weight and age) - Continue coreg   Left popliteal DVT: Dx April 2022 - Continue anticoagulation  History of CVAs: April 2022. - Continue eliquis  - Continue zetia  DVT prophylaxis: Eliquis Code Status: Full Family Communication: None at bedside.   Disposition Plan:  Status is: Inpatient. Pt newly hypoxic from acute CHF.  Remains inpatient appropriate because:Inpatient level of care appropriate due to severity of illness  Dispo: The patient is from: Home              Anticipated d/c is to: SNF recommended by PT, TOC consulted.  Patient currently is not medically stable to d/c.   Difficult to place patient No  Consultants:  IR Cardiology  Procedures:  Image-guided rescue of displaced perc chole 34F pigtail drain by Dr. Earleen Newport  8/1  Antimicrobials: None   Subjective: Shortness of breath is stable, though oxygen needs improved at rest. Still very lightheaded with exertion. Leg swelling is about the same.   Objective: Vitals:   07/30/21 0700 07/30/21 0830 07/30/21 0915 07/30/21 0941  BP:  126/61 124/66   Pulse:  63 68   Resp: 12 16 18    Temp:  (!) 97.4 F (36.3 C)    TempSrc:  Oral    SpO2:  94% 96% 92%  Weight:      Height:        Intake/Output Summary (Last 24 hours) at 07/30/2021 1021 Last data filed at 07/30/2021 0815 Gross per 24 hour  Intake 240 ml  Output 785 ml  Net -545 ml   Filed Weights   07/28/21 0335 07/29/21 0420 07/30/21 0545  Weight: 86.1 kg 85.7 kg 86.8 kg   Gen: 70 y.o. female in no distress Pulm: Nonlabored tachypnea with slight bibasilar crackles. CV: Regular rate and rhythm. No murmur, rub, or gallop. No JVD, 1+ pitting dependent edema. GI: Abdomen soft, non-tender, non-distended, with normoactive bowel sounds.  Ext: Warm, no deformities Skin: No rashes, lesions or ulcers on visualized skin. Neuro: Alert and oriented. No focal neurological deficits. Psych: Judgement and insight appear fair. Mood euthymic & affect congruent. Behavior is appropriate.    Data Reviewed: I have personally reviewed following labs and imaging studies  CBC: Recent Labs  Lab 07/26/21 1601 07/27/21 0354 07/27/21 1418  WBC 11.3* 10.6* 10.3  NEUTROABS 8.3*  --   --   HGB 10.1* 10.1* 10.3*  HCT 32.4* 32.2* 32.4*  MCV 96.7 95.3 91.8  PLT 452* 475* 824   Basic Metabolic Panel: Recent Labs  Lab 07/26/21 1601 07/27/21 1418 07/29/21 0024 07/30/21 0147  NA 138 136 135 131*  K 4.0 3.9 3.9 4.4  CL 111 106 101 100  CO2 20* 21* 24 23  GLUCOSE 78 156* 185* 188*  BUN 23 18 20  28*  CREATININE 1.78* 1.66* 1.84* 2.48*  CALCIUM 8.4* 8.4* 8.3* 8.3*  MG  --  1.8  --   --    GFR: Estimated Creatinine Clearance: 23.8 mL/min (A) (by C-G formula based on SCr of 2.48 mg/dL (H)). Liver Function  Tests: Recent Labs  Lab 07/26/21 1601  AST 17  ALT 10  ALKPHOS 54  BILITOT 0.9  PROT 6.5  ALBUMIN 2.9*   Recent Labs  Lab 07/26/21 1601  LIPASE 31   No results for input(s): AMMONIA in the last 168 hours. Coagulation Profile: No results for input(s): INR, PROTIME in the last 168 hours. Cardiac Enzymes: No results for input(s): CKTOTAL, CKMB, CKMBINDEX, TROPONINI in the last 168 hours. BNP (last 3 results) No results for input(s): PROBNP in the last 8760 hours. HbA1C: No results for input(s): HGBA1C in the last 72 hours. CBG: Recent Labs  Lab 07/29/21 1048 07/29/21 1523 07/29/21 1806 07/29/21 2105 07/30/21 0603  GLUCAP 234* 233* 227* 204* 157*   Lipid Profile: No results for input(s): CHOL, HDL, LDLCALC, TRIG, CHOLHDL, LDLDIRECT in the last 72 hours. Thyroid Function Tests: No results for input(s): TSH, T4TOTAL, FREET4, T3FREE, THYROIDAB in the last 72 hours. Anemia Panel: No results for input(s): VITAMINB12, FOLATE, FERRITIN, TIBC, IRON, RETICCTPCT in the last 72 hours. Urine analysis:  Component Value Date/Time   COLORURINE AMBER (A) 03/17/2021 1208   APPEARANCEUR CLOUDY (A) 03/17/2021 1208   LABSPEC 1.015 03/17/2021 1208   PHURINE 5.0 03/17/2021 1208   GLUCOSEU 50 (A) 03/17/2021 1208   HGBUR SMALL (A) 03/17/2021 1208   BILIRUBINUR NEGATIVE 03/17/2021 [place] 03/17/2021 1208   PROTEINUR >=300 (A) 03/17/2021 1208   UROBILINOGEN 0.2 01/17/2013 1910   NITRITE NEGATIVE 03/17/2021 1208   LEUKOCYTESUR LARGE (A) 03/17/2021 1208   Recent Results (from the past 240 hour(s))  Resp Panel by RT-PCR (Flu A&B, Covid) Nasopharyngeal Swab     Status: None   Collection Time: 07/26/21  4:02 PM   Specimen: Nasopharyngeal Swab; Nasopharyngeal(NP) swabs in vial transport medium  Result Value Ref Range Status   SARS Coronavirus 2 by RT PCR NEGATIVE NEGATIVE Final    Comment: (NOTE) SARS-CoV-2 target nucleic acids are NOT DETECTED.  The SARS-CoV-2 RNA  is generally detectable in upper respiratory specimens during the acute phase of infection. The lowest concentration of SARS-CoV-2 viral copies this assay can detect is 138 copies/mL. A negative result does not preclude SARS-Cov-2 infection and should not be used as the sole basis for treatment or other patient management decisions. A negative result may occur with  improper specimen collection/handling, submission of specimen other than nasopharyngeal swab, presence of viral mutation(s) within the areas targeted by this assay, and inadequate number of viral copies(<138 copies/mL). A negative result must be combined with clinical observations, patient history, and epidemiological information. The expected result is Negative.  Fact Sheet for Patients:  EntrepreneurPulse.com.au  Fact Sheet for Healthcare Providers:  IncredibleEmployment.be  This test is no t yet approved or cleared by the [place] FDA and  has been authorized for detection and/or diagnosis of SARS-CoV-2 by FDA under an Emergency Use Authorization (EUA). This EUA will remain  in effect (meaning this test can be used) for the duration of the COVID-19 declaration under Section 564(b)(1) of the Act, 21 U.S.C.section 360bbb-3(b)(1), unless the authorization is terminated  or revoked sooner.       Influenza A by PCR NEGATIVE NEGATIVE Final   Influenza B by PCR NEGATIVE NEGATIVE Final    Comment: (NOTE) The Xpert Xpress SARS-CoV-2/FLU/RSV plus assay is intended as an aid in the diagnosis of influenza from Nasopharyngeal swab specimens and should not be used as a sole basis for treatment. Nasal washings and aspirates are unacceptable for Xpert Xpress SARS-CoV-2/FLU/RSV testing.  Fact Sheet for Patients: EntrepreneurPulse.com.au  Fact Sheet for Healthcare Providers: IncredibleEmployment.be  This test is not yet approved or cleared by the  [place] FDA and has been authorized for detection and/or diagnosis of SARS-CoV-2 by FDA under an Emergency Use Authorization (EUA). This EUA will remain in effect (meaning this test can be used) for the duration of the COVID-19 declaration under Section 564(b)(1) of the Act, 21 U.S.C. section 360bbb-3(b)(1), unless the authorization is terminated or revoked.  Performed at [facility], [address] [place], [place] [address]   MRSA Next Gen by PCR, Nasal     Status: None   Collection Time: 07/27/21  1:16 AM   Specimen: Nasal Mucosa; Nasal Swab  Result Value Ref Range Status   MRSA by PCR Next Gen NOT DETECTED NOT DETECTED Final    Comment: (NOTE) The GeneXpert MRSA Assay (FDA approved for NASAL specimens only), is one component of a comprehensive MRSA colonization surveillance program. It is not intended to diagnose MRSA infection nor to guide or monitor treatment  for MRSA infections. Test performance is not FDA approved in patients less than 72 years old. Performed at [facility], [place] [address] [place],  [address]       Radiology Studies: No results found.  Scheduled Meds:  apixaban  5 mg Oral BID   carvedilol  6.25 mg Oral BID WC   ezetimibe  10 mg Oral Daily   insulin aspart  0-15 Units Subcutaneous TID WC   insulin aspart  0-5 Units Subcutaneous QHS   insulin glargine-yfgn  10 Units Subcutaneous QHS   isosorbide-hydrALAZINE  1 tablet Oral TID   mouth rinse  15 mL Mouth Rinse BID   tamsulosin  0.4 mg Oral q morning   Continuous Infusions:     LOS: 3 days   Time spent: 35 minutes.  Patrecia Pour, MD [facility] [email or web address] 07/30/2021, 10:21 AM

## 2021-07-30 NOTE — Progress Notes (Signed)
Mobility Specialist: Progress Note   07/30/21 1433  Mobility  Activity Ambulated in hall  Level of Assistance Contact guard assist, steadying assist  Assistive Device Front wheel walker  Distance Ambulated (ft) 360 ft  Mobility Ambulated with assistance in hallway  Mobility Response Tolerated well  Mobility performed by Mobility specialist;Other (comment) (OT Almyra Free)  $Mobility charge 1 Mobility   Post-Mobility: 88 HR, 134/69 BP, 95% SpO2  Pt on BSC with OT upon entering room. Pt agreeable to ambulate. OT Almyra Free followed behind with the chair for safety. Pt did much better today and said she feels much better today as well. Pt back to bed after walk with call bell and phone at her side.   [facility] Tracey Bryant Mobility Specialist Mobility Specialist Phone: [phone]

## 2021-07-30 NOTE — Progress Notes (Addendum)
Progress Note  Patient Name: Tracey Bryant Date of Encounter: 07/30/2021  [facility] Cardiologist: New to Dr. Percival Spanish, prefer to follow up at [facility] office  Subjective   Patient states she is feeling overall improved with DOE, she was able to walk some distance yesterday, did feel SOB and tired. She still has significant edema of her legs, but improving. She states her external suction Foley is out currently and wishes it to be put back. She denied any chest pain, resting SOB, dizziness, or oliguria.   Inpatient Medications    Scheduled Meds:  apixaban  5 mg Oral BID   carvedilol  6.25 mg Oral BID WC   ezetimibe  10 mg Oral Daily   insulin aspart  0-15 Units Subcutaneous TID WC   insulin aspart  0-5 Units Subcutaneous QHS   insulin glargine-yfgn  10 Units Subcutaneous QHS   isosorbide-hydrALAZINE  1 tablet Oral TID   mouth rinse  15 mL Mouth Rinse BID   tamsulosin  0.4 mg Oral q morning   Continuous Infusions:  PRN Meds: acetaminophen **OR** acetaminophen, ondansetron **OR** ondansetron (ZOFRAN) IV, senna-docusate   Vital Signs    Vitals:   07/30/21 0700 07/30/21 0830 07/30/21 0915 07/30/21 0941  BP:  126/61 124/66   Pulse:  63 68   Resp: 12 16 18    Temp:  (!) 97.4 F (36.3 C)    TempSrc:  Oral    SpO2:  94% 96% 92%  Weight:      Height:        Intake/Output Summary (Last 24 hours) at 07/30/2021 0957 Last data filed at 07/30/2021 0815 Gross per 24 hour  Intake 240 ml  Output 785 ml  Net -545 ml   Last 3 Weights 07/30/2021 07/29/2021 07/28/2021  Weight (lbs) 191 lb 5.8 oz 188 lb 15 oz 189 lb 13.1 oz  Weight (kg) 86.8 kg 85.7 kg 86.1 kg      Telemetry    Atrial paced rhythm with rate of 60s this AM, was in SR rate of 60s prior  - Personally Reviewed  ECG    N/A this AM  - Personally Reviewed  Physical Exam   GEN: No acute distress. Mild DOE Neck: No JVD Cardiac: RRR, no murmurs, rubs, or gallops.  Respiratory: Bibasilar crackles noted on  auscultation bilaterally. On room air. Speaks full sentence  GI: Soft, nontender, non-distended C tube drain in place.  MS: BLE 1-2+ pitting edema; No deformity. Neuro:  A O x3, speech appropriate, mild memory loss , follow commands appropriately  Psych: Normal affect  Labs    High Sensitivity Troponin:   Recent Labs  Lab 07/26/21 1601 07/26/21 1745  TROPONINIHS 14 15      Chemistry Recent Labs  Lab 07/26/21 1601 07/27/21 1418 07/29/21 0024 07/30/21 0147  NA 138 136 135 131*  K 4.0 3.9 3.9 4.4  CL 111 106 101 100  CO2 20* 21* 24 23  GLUCOSE 78 156* 185* 188*  BUN 23 18 20  28*  CREATININE 1.78* 1.66* 1.84* 2.48*  CALCIUM 8.4* 8.4* 8.3* 8.3*  PROT 6.5  --   --   --   ALBUMIN 2.9*  --   --   --   AST 17  --   --   --   ALT 10  --   --   --   ALKPHOS 54  --   --   --   BILITOT 0.9  --   --   --  GFRNONAA 31* 33* 29* 21*  ANIONGAP 7 9 10 8      Hematology Recent Labs  Lab 07/26/21 1601 07/27/21 0354 07/27/21 1418  WBC 11.3* 10.6* 10.3  RBC 3.35* 3.38* 3.53*  HGB 10.1* 10.1* 10.3*  HCT 32.4* 32.2* 32.4*  MCV 96.7 95.3 91.8  MCH 30.1 29.9 29.2  MCHC 31.2 31.4 31.8  RDW 17.6* 17.4* 17.3*  PLT 452* 475* 322    BNP Recent Labs  Lab 07/26/21 1601  BNP 1,291.0*     DDimer No results for input(s): DDIMER in the last 168 hours.   Radiology    No results found.  Cardiac Studies   Echocardiogram from 03/18/2021:   1. Left ventricular ejection fraction, by estimation, is 25 to 30%. The  left ventricle has normal function. The left ventricle demonstrates global  hypokinesis. There is mild left ventricular hypertrophy. Left ventricular  diastolic parameters are  consistent with Grade II diastolic dysfunction (pseudonormalization).  Elevated left atrial pressure.   2. RV poorly visualized, grossly appears enlarged with decreased systolic  function. . Right ventricular systolic function was not well visualized.  The right ventricular size is not well  visualized.   3. Left atrial size was severely dilated.   4. The mitral valve is normal in structure. No evidence of mitral valve  regurgitation. No evidence of mitral stenosis.   5. The aortic valve is tricuspid. There is mild calcification of the  aortic valve. There is mild thickening of the aortic valve. Aortic valve  regurgitation is not visualized. No aortic stenosis is present.   6. Mild pulmonary HTN, PASP is 36 mmHg.   7. The inferior vena cava is normal in size with greater than 50%  respiratory variability, suggesting right atrial pressure of 3 mmHg.    Patient Profile     Tracey Bryant is a 70 y.o. 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   FYI patient is Jehovah's Witness and refuses blood transfusion.  Assessment & Plan    Acute on chronic systolic and diastolic heart failure Cardiomyopathy -presented with 1 week worsening of shortness of breath, leg edema -BNP 1291 (was 2160 on 04/07/21) -CXR with worsening interstitial edema and probable small bilateral pleural effusion -Echocardiogram from 03/18/2021 with EF 25 to 30%, see report above for details -Urine output 451ml over the past 24 hours, net -5.3 L since admission -Weight 173 >189>191 ?? 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 -Creatinine 1.78 POA, was 2.39 from 04/07/21 discharge, was improved but now Cr uptrending to 2.48  -UOP decreasing but not accurately measured since external Foley is out, BUN 28 today, will stop Lasix  -trend BMP and monitor UOP closely , avoid nephrotoxin   Paroxysmal atrial fibrillation History of Medtronic PPM implant 05/19/20 -Currently remains  in sinus rhythm/atrial paced rhythm  -Continue anticoagulation with Eliquis, will renal dose if Cr continue worsen tomorrow  -device interrogation on 07/27/21 without acute problems bedside brief NSVT in July 2022 per [employer] representative reports , no A fib since 03/2021    Hypertension -started bidil , increased carvedilol, HOLD lasix today, BP controled   Insulin-dependent type 2 diabetes Cholecystitis with cholecystectomy tube in place Left popliteal DVT since 03/2021 History of multiple ischemic CVAs Anemia  -Managed per IM    For questions or updates, please contact [facility] Please consult [email or web address] for contact info under        Signed, Margie Billet, NP  07/30/2021, 9:57 AM    History and all data above reviewed.  Patient examined.  I agree with the findings as above.  Her breathing is improved.  However, she is very fatigued and has a poor exercise tolerance.  She has continued though improved edema.  The patient exam reveals COR:RRR  ,  Lungs: Clear  ,  Abd: Positive bowel sounds, no rebound no guarding, Ext No edema  .  All available labs, radiology testing, previous records reviewed. Agree with documented assessment and plan. Acute systolic HF:  Creat is up and Na is down.  Poor prognostic indicators in this patient.  Today I am going to apply compression stockings.  Follow labs and strict I/O output.  She might need inotropes if no improvement although her oxygenation has improved.  I will apply compression  stockings.   Jeneen Rinks Shakiara Lukic  2:24 PM  07/30/2021

## 2021-07-31 LAB — BASIC METABOLIC PANEL
Anion gap: 8 (ref 5–15)
BUN: 32 mg/dL — ABNORMAL HIGH (ref 8–23)
CO2: 23 mmol/L (ref 22–32)
Calcium: 8.4 mg/dL — ABNORMAL LOW (ref 8.9–10.3)
Chloride: 101 mmol/L (ref 98–111)
Creatinine, Ser: 2.58 mg/dL — ABNORMAL HIGH (ref 0.44–1.00)
GFR, Estimated: 20 mL/min — ABNORMAL LOW (ref >60)
Glucose, Bld: 145 mg/dL — ABNORMAL HIGH (ref 70–99)
Potassium: 4.5 mmol/L (ref 3.5–5.1)
Sodium: 132 mmol/L — ABNORMAL LOW (ref 135–145)

## 2021-07-31 LAB — GLUCOSE, CAPILLARY
Glucose-Capillary: 109 mg/dL — ABNORMAL HIGH (ref 70–99)
Glucose-Capillary: 216 mg/dL — ABNORMAL HIGH (ref 70–99)
Glucose-Capillary: 152 mg/dL — ABNORMAL HIGH (ref 70–99)
Glucose-Capillary: 151 mg/dL — ABNORMAL HIGH (ref 70–99)

## 2021-07-31 NOTE — Progress Notes (Signed)
Progress Note  Patient Name: Tracey Bryant Date of Encounter: 07/31/2021  [facility] Cardiologist: New (Prefers to follow up at [facility] office.)  Subjective   She says that she walked today off O2 and sats stayed above 90.  Currently resting RA sat at 97%.  Decreased lower extremity edema.    Inpatient Medications    Scheduled Meds:  apixaban  5 mg Oral BID   carvedilol  6.25 mg Oral BID WC   ezetimibe  10 mg Oral Daily   insulin aspart  0-15 Units Subcutaneous TID WC   insulin aspart  0-5 Units Subcutaneous QHS   insulin glargine-yfgn  10 Units Subcutaneous QHS   isosorbide-hydrALAZINE  1 tablet Oral TID   mouth rinse  15 mL Mouth Rinse BID   tamsulosin  0.4 mg Oral q morning   Continuous Infusions:  PRN Meds: acetaminophen **OR** acetaminophen, ondansetron **OR** ondansetron (ZOFRAN) IV, senna-docusate   Vital Signs    Vitals:   07/30/21 1958 07/30/21 2333 07/31/21 0344 07/31/21 0719  BP: 131/61 (!) 128/57 (!) 124/57 (!) 116/58  Pulse: 62 60 61   Resp: 17 16 16 19   Temp: 98.1 F (36.7 C) 98 F (36.7 C) 98 F (36.7 C) 97.7 F (36.5 C)  TempSrc: Oral Oral Oral Oral  SpO2: 93% 93% 93%   Weight:   87.1 kg   Height:        Intake/Output Summary (Last 24 hours) at 07/31/2021 1216 Last data filed at 07/31/2021 0359 Gross per 24 hour  Intake 120 ml  Output 615 ml  Net -495 ml   Last 3 Weights 07/31/2021 07/30/2021 07/29/2021  Weight (lbs) 192 lb 0.3 oz 191 lb 5.8 oz 188 lb 15 oz  Weight (kg) 87.1 kg 86.8 kg 85.7 kg      Telemetry    NSR, atrial paced rhythm  - Personally Reviewed  ECG    N/A  - Personally Reviewed  Physical Exam   GEN: No  acute distress.   Neck: No  JVD Cardiac: RRR, no murmurs, rubs, or gallops.  Respiratory: Clear   to auscultation bilaterally. GI: Soft, nontender, non-distended, normal bowel sounds  MS:    Mild/mod bilateral lower extremity edema; No deformity. Neuro:   Nonfocal  Psych: Oriented and appropriate   Labs     High Sensitivity Troponin:   Recent Labs  Lab 07/26/21 1601 07/26/21 1745  TROPONINIHS 14 15      Chemistry Recent Labs  Lab 07/26/21 1601 07/27/21 1418 07/29/21 0024 07/30/21 0147 07/31/21 0024  NA 138   < > 135 131* 132*  K 4.0   < > 3.9 4.4 4.5  CL 111   < > 101 100 101  CO2 20*   < > 24 23 23   GLUCOSE 78   < > 185* 188* 145*  BUN 23   < > 20 28* 32*  CREATININE 1.78*   < > 1.84* 2.48* 2.58*  CALCIUM 8.4*   < > 8.3* 8.3* 8.4*  PROT 6.5  --   --   --   --   ALBUMIN 2.9*  --   --   --   --   AST 17  --   --   --   --   ALT 10  --   --   --   --   ALKPHOS 54  --   --   --   --   BILITOT 0.9  --   --   --   --  GFRNONAA 31*   < > 29* 21* 20*  ANIONGAP 7   < > 10 8 8    < > = values in this interval not displayed.     Hematology Recent Labs  Lab 07/26/21 1601 07/27/21 0354 07/27/21 1418  WBC 11.3* 10.6* 10.3  RBC 3.35* 3.38* 3.53*  HGB 10.1* 10.1* 10.3*  HCT 32.4* 32.2* 32.4*  MCV 96.7 95.3 91.8  MCH 30.1 29.9 29.2  MCHC 31.2 31.4 31.8  RDW 17.6* 17.4* 17.3*  PLT 452* 475* 322    BNP Recent Labs  Lab 07/26/21 1601  BNP 1,291.0*     DDimer No results for input(s): DDIMER in the last 168 hours.   Radiology    No results found.  Cardiac Studies   Echocardiogram from 03/18/2021:   1. Left ventricular ejection fraction, by estimation, is 25 to 30%. The  left ventricle has normal function. The left ventricle demonstrates global  hypokinesis. There is mild left ventricular hypertrophy. Left ventricular  diastolic parameters are  consistent with Grade II diastolic dysfunction (pseudonormalization).  Elevated left atrial pressure.   2. RV poorly visualized, grossly appears enlarged with decreased systolic  function. . Right ventricular systolic function was not well visualized.  The right ventricular size is not well visualized.   3. Left atrial size was severely dilated.   4. The mitral valve is normal in structure. No evidence of mitral valve   regurgitation. No evidence of mitral stenosis.   5. The aortic valve is tricuspid. There is mild calcification of the  aortic valve. There is mild thickening of the aortic valve. Aortic valve  regurgitation is not visualized. No aortic stenosis is present.   6. Mild pulmonary HTN, PASP is 36 mmHg.   7. The inferior vena cava is normal in size with greater than 50%  respiratory variability, suggesting right atrial pressure of 3 mmHg.    Patient Profile     Tracey Bryant is a 70 y.o. 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   FYI patient is Jehovah's Witness and refuses blood transfusion.  Assessment & Plan    Acute on chronic systolic and diastolic heart failure Cardiomyopathy Seems to have turned a corner symptomatically.  She did not get her compression stockings and I spoke with nursing.  Held off on diuretic yesterday and I would again today.  She will need some low dose PO at discharge with close follow up of her creat.  We will arrange this.  OK to discharge in the AM most likely to home.  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  Follow closely as above.    Paroxysmal atrial fibrillation History of Medtronic PPM implant 05/19/20 NSR.  No change in therapy.    We will arrange device follow up .    Hypertension This is being managed in the context of treating his CHF    Insulin-dependent type 2 diabetes Cholecystitis with cholecystectomy tube in place Left popliteal DVT since 03/2021 History of multiple ischemic CVAs Anemia  Per IM  For questions or updates, please contact [facility] Please consult [email or web address]  for contact info under        Signed, Minus Breeding, MD  07/31/2021, 12:16 PM

## 2021-07-31 NOTE — TOC Progression Note (Signed)
Transition of Care [facility]) - Progression Note    Patient Details  Name: Tracey Bryant MRN: [id number] Date of Birth: 01-03-1951  [facility] CM/SW [place], [place] Phone Number: 07/31/2021, 1:24 PM  Clinical Narrative:     CSW called pt daughter to provide update; no answer, left voicemail requesting return call.       Expected Discharge Plan and Services                                                 Social Determinants of Health (SDOH) Interventions    Readmission Risk Interventions Readmission Risk Prevention Plan 03/19/2021 03/17/2021  Transportation Screening - Complete  PCP or Specialist Appt within 5-7 Days Complete -  Home Care Screening Complete -  Medication Review (RN CM) - Complete  Some recent data might be hidden

## 2021-07-31 NOTE — Progress Notes (Signed)
PROGRESS NOTE  Tracey Bryant  VEL:[id number] DOB: 1951/04/24 DOA: 07/26/2021 PCP: Coolidge Breeze, FNP   Brief Narrative: Tracey Bryant is a 70 y.o. female with a history of T2DM, PAF, DVT, PVD, CVA, HTN, acute cholecystitis s/p cholecystostomy March 2022 who presented to the ED at [facility] with increasing dyspnea, orthopnea, leg swelling, abdominal bloating and also found her drain dislodged just PTA. She was afebrile and hypoxic, with WBC 11.2k, hgb 10.1, BNP 1291, creatinine elevated near baseline at 1.78 and albumin low at 2.9. CXR revealed chronic cardiomegaly and left base opacity consistent with atelectasis with acute findings of interstitial edema and bilateral pleural effusions. She was admitted for acute on chronic HFrEF, cardiology consulted, and diuresis is ongoing. AKI developed 8/4 for which diuretic is held.  Assessment & Plan: Principal Problem:   Acute on chronic combined systolic and diastolic congestive heart failure (HCC) Active Problems:   Type II diabetes mellitus (HCC)   Hypertension   Paroxysmal atrial fibrillation (HCC)   DVT (deep venous thrombosis) (HCC)   Normocytic anemia   Cholecystostomy tube dysfunction   Moderate protein-calorie malnutrition (HCC)   Acute on chronic HFrEF (heart failure with reduced ejection fraction) (HCC)  Acute hypoxic respiratory failure due to acute CHF, pleural effusions, atelectasis:  - Hypoxia resolved - continue incentive spirometry.   Acute on chronic HFrEF, HTN: Has clinical evidence of overload, elevated BNP and pulmonary edema on CXR causing hypoxia. LVEF 25-30%, G2DD, decreased RV systolic function as well, severe LAE, pulmonary HTN (ASP 36 mmHg) by recent echo - With SCr rise, diuretics held, will hold again today. Will need lower oral dose at discharge, but would like to confirm renal improvement before discharging on this.  - Appreciate cardiology assistance. No ischemic evaluation planned at this time. - Continue coreg,  norvasc. Increased coreg. Started bidil this admission with improvement in HTN. Not on ACE/ARB/ARNI due to CKD.  - Follow up with cardiology in [place] has been arranged for 8/24.   Cholecystitis s/p cholecystostomy: Dislodged PTA and replaced in IR 8/1.  - Drain output continues to be significant, exam benign. Continue monitoring output to gravity and routine wound care. - Routine exchange recommended in 3 months. If not planning surgery, IR plans to refer to [place] clinic for percutaneous management (spyglass)  IDT2DM with symptomatic hypoglycemia: HbA1c 9.1% earlier this year.  - At inpatient goal with reduced lantus dosing.   - Continue mod SSI, HS correction. - Continue hypoglycemia protocol.   AKI on stage IIIa CKD: Due to diuresis.  - Still slightly worse creatinine today, though suspect this represents plateauing. Will recheck in AM - Hold diuretic, can't give entresto.   PAF s/p PPM:  - Continue eliquis (does not qualify for reduced/renal dosing with weight and age) - Continue coreg   Left popliteal DVT: Dx April 2022 - Continue anticoagulation  History of CVAs: April 2022. - Continue eliquis  - Continue zetia  DVT prophylaxis: Eliquis Code Status: Full Family Communication: None at bedside.   Disposition Plan:  Status is: Inpatient.    Remains inpatient appropriate because:Inpatient level of care appropriate due to severity of illness  Dispo: The patient is from: Home              Anticipated d/c is to: Home - patient declines SNF placement as recommended by therapy              Patient currently is not medically stable to d/c.   Difficult to place patient No  Consultants:  IR Cardiology  Procedures:  Image-guided rescue of displaced perc chole 67F pigtail drain by Dr. Earleen Newport 8/1  Antimicrobials: None   Subjective: Shortness of breath remains improved, exertional capacity is better than it has been in a while. She's gaining strength and feels confident she  will do well at home with her daughter who can assist. Leg swelling is improved dramatically from admission, stable from yesterday, mild.  Objective: Vitals:   07/30/21 1958 07/30/21 2333 07/31/21 0344 07/31/21 0719  BP: 131/61 (!) 128/57 (!) 124/57 (!) 116/58  Pulse: 62 60 61   Resp: 17 16 16 19   Temp: 98.1 F (36.7 C) 98 F (36.7 C) 98 F (36.7 C) 97.7 F (36.5 C)  TempSrc: Oral Oral Oral Oral  SpO2: 93% 93% 93%   Weight:   87.1 kg   Height:        Intake/Output Summary (Last 24 hours) at 07/31/2021 1249 Last data filed at 07/31/2021 0359 Gross per 24 hour  Intake 120 ml  Output 615 ml  Net -495 ml   Filed Weights   07/29/21 0420 07/30/21 0545 07/31/21 0344  Weight: 85.7 kg 86.8 kg 87.1 kg   Gen: 70 y.o. female in no distress Pulm: Nonlabored breathing room air at rest, no crackles or wheezes.  CV: Regular rate and rhythm. No murmur, rub, or gallop. No JVD, notrace-to-1+ pitting (much improved) dependent edema. GI: Abdomen soft, non-tender, non-distended, with normoactive bowel sounds.  Ext: Warm, no deformities Skin: No new rashes, lesions or ulcers on visualized skin. Neuro: Alert and oriented. No focal neurological deficits. Psych: Judgement and insight appear fair. Mood euthymic & affect congruent. Behavior is appropriate.    Data Reviewed: I have personally reviewed following labs and imaging studies  CBC: Recent Labs  Lab 07/26/21 1601 07/27/21 0354 07/27/21 1418  WBC 11.3* 10.6* 10.3  NEUTROABS 8.3*  --   --   HGB 10.1* 10.1* 10.3*  HCT 32.4* 32.2* 32.4*  MCV 96.7 95.3 91.8  PLT 452* 475* 973   Basic Metabolic Panel: Recent Labs  Lab 07/26/21 1601 07/27/21 1418 07/29/21 0024 07/30/21 0147 07/31/21 0024  NA 138 136 135 131* 132*  K 4.0 3.9 3.9 4.4 4.5  CL 111 106 101 100 101  CO2 20* 21* 24 23 23   GLUCOSE 78 156* 185* 188* 145*  BUN 23 18 20  28* 32*  CREATININE 1.78* 1.66* 1.84* 2.48* 2.58*  CALCIUM 8.4* 8.4* 8.3* 8.3* 8.4*  MG  --  1.8  --    --   --    GFR: Estimated Creatinine Clearance: 22.9 mL/min (A) (by C-G formula based on SCr of 2.58 mg/dL (H)). Liver Function Tests: Recent Labs  Lab 07/26/21 1601  AST 17  ALT 10  ALKPHOS 54  BILITOT 0.9  PROT 6.5  ALBUMIN 2.9*   Recent Labs  Lab 07/26/21 1601  LIPASE 31   No results for input(s): AMMONIA in the last 168 hours. Coagulation Profile: No results for input(s): INR, PROTIME in the last 168 hours. Cardiac Enzymes: No results for input(s): CKTOTAL, CKMB, CKMBINDEX, TROPONINI in the last 168 hours. BNP (last 3 results) No results for input(s): PROBNP in the last 8760 hours. HbA1C: No results for input(s): HGBA1C in the last 72 hours. CBG: Recent Labs  Lab 07/30/21 0603 07/30/21 1102 07/30/21 1559 07/30/21 2109 07/31/21 0610  GLUCAP 157* 198* 205* 154* 109*   Lipid Profile: No results for input(s): CHOL, HDL, LDLCALC, TRIG, CHOLHDL, LDLDIRECT in the last 72  hours. Thyroid Function Tests: No results for input(s): TSH, T4TOTAL, FREET4, T3FREE, THYROIDAB in the last 72 hours. Anemia Panel: No results for input(s): VITAMINB12, FOLATE, FERRITIN, TIBC, IRON, RETICCTPCT in the last 72 hours. Urine analysis:    Component Value Date/Time   COLORURINE AMBER (A) 03/17/2021 1208   APPEARANCEUR CLOUDY (A) 03/17/2021 1208   LABSPEC 1.015 03/17/2021 1208   PHURINE 5.0 03/17/2021 1208   GLUCOSEU 50 (A) 03/17/2021 1208   HGBUR SMALL (A) 03/17/2021 1208   BILIRUBINUR NEGATIVE 03/17/2021 [place] 03/17/2021 1208   PROTEINUR >=300 (A) 03/17/2021 1208   UROBILINOGEN 0.2 01/17/2013 1910   NITRITE NEGATIVE 03/17/2021 1208   LEUKOCYTESUR LARGE (A) 03/17/2021 1208   Recent Results (from the past 240 hour(s))  Resp Panel by RT-PCR (Flu A&B, Covid) Nasopharyngeal Swab     Status: None   Collection Time: 07/26/21  4:02 PM   Specimen: Nasopharyngeal Swab; Nasopharyngeal(NP) swabs in vial transport medium  Result Value Ref Range Status   SARS  Coronavirus 2 by RT PCR NEGATIVE NEGATIVE Final    Comment: (NOTE) SARS-CoV-2 target nucleic acids are NOT DETECTED.  The SARS-CoV-2 RNA is generally detectable in upper respiratory specimens during the acute phase of infection. The lowest concentration of SARS-CoV-2 viral copies this assay can detect is 138 copies/mL. A negative result does not preclude SARS-Cov-2 infection and should not be used as the sole basis for treatment or other patient management decisions. A negative result may occur with  improper specimen collection/handling, submission of specimen other than nasopharyngeal swab, presence of viral mutation(s) within the areas targeted by this assay, and inadequate number of viral copies(<138 copies/mL). A negative result must be combined with clinical observations, patient history, and epidemiological information. The expected result is Negative.  Fact Sheet for Patients:  EntrepreneurPulse.com.au  Fact Sheet for Healthcare Providers:  IncredibleEmployment.be  This test is no t yet approved or cleared by the [place] FDA and  has been authorized for detection and/or diagnosis of SARS-CoV-2 by FDA under an Emergency Use Authorization (EUA). This EUA will remain  in effect (meaning this test can be used) for the duration of the COVID-19 declaration under Section 564(b)(1) of the Act, 21 U.S.C.section 360bbb-3(b)(1), unless the authorization is terminated  or revoked sooner.       Influenza A by PCR NEGATIVE NEGATIVE Final   Influenza B by PCR NEGATIVE NEGATIVE Final    Comment: (NOTE) The Xpert Xpress SARS-CoV-2/FLU/RSV plus assay is intended as an aid in the diagnosis of influenza from Nasopharyngeal swab specimens and should not be used as a sole basis for treatment. Nasal washings and aspirates are unacceptable for Xpert Xpress SARS-CoV-2/FLU/RSV testing.  Fact Sheet for  Patients: EntrepreneurPulse.com.au  Fact Sheet for Healthcare Providers: IncredibleEmployment.be  This test is not yet approved or cleared by the [place] FDA and has been authorized for detection and/or diagnosis of SARS-CoV-2 by FDA under an Emergency Use Authorization (EUA). This EUA will remain in effect (meaning this test can be used) for the duration of the COVID-19 declaration under Section 564(b)(1) of the Act, 21 U.S.C. section 360bbb-3(b)(1), unless the authorization is terminated or revoked.  Performed at [facility], [address] [place], [place] [address]   MRSA Next Gen by PCR, Nasal     Status: None   Collection Time: 07/27/21  1:16 AM   Specimen: Nasal Mucosa; Nasal Swab  Result Value Ref Range Status   MRSA by PCR Next Gen NOT DETECTED NOT DETECTED  Final    Comment: (NOTE) The GeneXpert MRSA Assay (FDA approved for NASAL specimens only), is one component of a comprehensive MRSA colonization surveillance program. It is not intended to diagnose MRSA infection nor to guide or monitor treatment for MRSA infections. Test performance is not FDA approved in patients less than 78 years old. Performed at [facility], [place] [address], [place], [place] [address]       Radiology Studies: No results found.  Scheduled Meds:  apixaban  5 mg Oral BID   carvedilol  6.25 mg Oral BID WC   ezetimibe  10 mg Oral Daily   insulin aspart  0-15 Units Subcutaneous TID WC   insulin aspart  0-5 Units Subcutaneous QHS   insulin glargine-yfgn  10 Units Subcutaneous QHS   isosorbide-hydrALAZINE  1 tablet Oral TID   mouth rinse  15 mL Mouth Rinse BID   tamsulosin  0.4 mg Oral q morning   Continuous Infusions:     LOS: 4 days   Time spent: 25 minutes.  Patrecia Pour, MD [facility] [email or web address] 07/31/2021, 12:49 PM

## 2021-07-31 NOTE — Care Management Important Message (Signed)
Important Message  Patient Details  Name: Tracey Bryant MRN: [id number] Date of Birth: [birth year]   Medicare Important Message Given:  Yes     Kenecia Barren [address] 07/31/2021, 3:59 PM

## 2021-07-31 NOTE — TOC Progression Note (Signed)
Transition of Care [facility]) - Progression Note    Patient Details  Name: Tracey Bryant MRN: [id number] Date of Birth: Apr 06, 1951  [facility]) CM/SW Contact  Zenon Mayo, RN Phone Number: 07/31/2021, 7:45 AM  Clinical Narrative:    Per CSW, patient is refusing SNF and also HH, patient states she will go stay with her daughter.        Expected Discharge Plan and Services                                                 Social Determinants of Health (SDOH) Interventions    Readmission Risk Interventions Readmission Risk Prevention Plan 03/19/2021 03/17/2021  Transportation Screening - Complete  PCP or Specialist Appt within 5-7 Days Complete -  Home Care Screening Complete -  Medication Review (RN CM) - Complete  Some recent data might be hidden

## 2021-07-31 NOTE — Progress Notes (Signed)
Occupational Therapy Treatment Patient Details Name: Tracey Bryant MRN: [id number] DOB: 1951/02/09 Today's Date: 07/31/2021    History of present illness 70 y.o. female admitted 7/31 with acute on chronic combined systolic and diastolic CHF. 8/1 cholecystectomy tube replacement. PMHx:T2DM, PAF, PPM, DVT, PVD, CVA, HTN, acute cholecystitis s/p cholecystostomy March 2022.   OT comments  Pt able to progress mobility in room during ADLs to supervision with RW with no LOB or fatigue. Pt able to complete toileting tasks and ADLs standing at sink with Setup assist. Reinforced use of DME at home to maximize safety, as well as initial family supervision during tasks at home. Pt verbalized understanding and motivated to return home. Recommended HHOT though pt politely declines [facility] services and reports she will be alright with solely family support.    Follow Up Recommendations  Home health OT (direct supervision for showering tasks and mobility initially)    Equipment Recommendations  None recommended by OT    Recommendations for Other Services      Precautions / Restrictions Precautions Precautions: Fall;Other (comment) Precaution Comments: hx of syncopal episodes Restrictions Weight Bearing Restrictions: No       Mobility Bed Mobility Overal bed mobility: Modified Independent                  Transfers Overall transfer level: Needs assistance Equipment used: Rolling walker (2 wheeled) Transfers: Sit to/from [employer] Transfers Sit to Stand: Supervision Stand pivot transfers: Supervision       General transfer comment: Supervision, no LOB or physical assist needed in standing from bed or turning to/from toilet    Balance Overall balance assessment: Needs assistance Sitting-balance support: No upper extremity supported;Feet supported Sitting balance-Leahy Scale: Good     Standing balance support: Bilateral upper extremity supported Standing balance-Leahy Scale:  Fair Standing balance comment: fair static standing for clothing mgmt, use of BUE for mobility                           ADL either performed or assessed with clinical judgement   ADL Overall ADL's : Needs assistance/impaired     Grooming: Set up;Standing;Wash/dry face;Brushing hair                   Toilet Transfer: Supervision/safety;Ambulation;Regular Toilet;RW Toilet Transfer Details (indicate cue type and reason): no LOB, assist only for line mgmt Toileting- Clothing Manipulation and Hygiene: Set up;Sit to/from stand Toileting - Clothing Manipulation Details (indicate cue type and reason): Setup for hygiene using washcloth     Functional mobility during ADLs: Supervision/safety;Rolling walker General ADL Comments: Pt with decreased physical assist needed, reports still feeling weak though improving every day     Vision   Vision Assessment?: No apparent visual deficits   Perception     Praxis      Cognition Arousal/Alertness: Awake/alert Behavior During Therapy: WFL for tasks assessed/performed Overall Cognitive Status: Within Functional Limits for tasks assessed                                          Exercises     Shoulder Instructions       General Comments VSS on RA    Pertinent Vitals/ Pain       Pain Assessment: No/denies pain  Home Living  Prior Functioning/Environment              Frequency  Min 2X/week        Progress Toward Goals  OT Goals(current goals can now be found in the care plan section)  Progress towards OT goals: Progressing toward goals  Acute Rehab OT Goals Patient Stated Goal: go home OT Goal Formulation: With patient Time For Goal Achievement: 08/13/21 Potential to Achieve Goals: Good ADL Goals Pt Will Perform Grooming: with modified independence;standing Pt Will Perform Lower Body Dressing: with modified independence;sit  to/from stand;sitting/lateral leans Pt Will Transfer to Toilet: with modified independence;ambulating Pt/caregiver will Perform Home Exercise Program: Increased strength;Both right and left upper extremity;With theraband;Independently;With written HEP provided  Plan Discharge plan remains appropriate    Co-evaluation                 AM-PAC OT "6 Clicks" Daily Activity     Outcome Measure   Help from another person eating meals?: None Help from another person taking care of personal grooming?: A Little Help from another person toileting, which includes using toliet, bedpan, or urinal?: A Little Help from another person bathing (including washing, rinsing, drying)?: A Little Help from another person to put on and taking off regular upper body clothing?: A Little Help from another person to put on and taking off regular lower body clothing?: A Little 6 Click Score: 19    End of Session Equipment Utilized During Treatment: Rolling walker  OT Visit Diagnosis: Unsteadiness on feet (R26.81);Muscle weakness (generalized) (M62.81)   Activity Tolerance Patient tolerated treatment well   Patient Left in chair;with call bell/phone within reach;with chair alarm set   Nurse Communication Mobility status        Time: 6283-1517 OT Time Calculation (min): 28 min  Charges: OT General Charges $OT Visit: 1 Visit OT Treatments $Self Care/Home Management : 23-37 mins  Malachy Chamber, OTR/L Acute Rehab Services Office: [phone]    [name] 07/31/2021, 12:10 PM

## 2021-08-01 LAB — BASIC METABOLIC PANEL
Anion gap: 9 (ref 5–15)
BUN: 35 mg/dL — ABNORMAL HIGH (ref 8–23)
CO2: 20 mmol/L — ABNORMAL LOW (ref 22–32)
Calcium: 8.3 mg/dL — ABNORMAL LOW (ref 8.9–10.3)
Chloride: 102 mmol/L (ref 98–111)
Creatinine, Ser: 2.43 mg/dL — ABNORMAL HIGH (ref 0.44–1.00)
GFR, Estimated: 21 mL/min — ABNORMAL LOW (ref >60)
Glucose, Bld: 131 mg/dL — ABNORMAL HIGH (ref 70–99)
Potassium: 4.7 mmol/L (ref 3.5–5.1)
Sodium: 131 mmol/L — ABNORMAL LOW (ref 135–145)

## 2021-08-01 LAB — GLUCOSE, CAPILLARY
Glucose-Capillary: 102 mg/dL — ABNORMAL HIGH (ref 70–99)
Glucose-Capillary: 170 mg/dL — ABNORMAL HIGH (ref 70–99)
Glucose-Capillary: 218 mg/dL — ABNORMAL HIGH (ref 70–99)

## 2021-08-01 MED ORDER — ISOSORB DINITRATE-HYDRALAZINE 20-37.5 MG PO TABS: 1.0000 | ORAL_TABLET | Freq: Three times a day (TID) | ORAL | 0 refills | Status: DC

## 2021-08-01 MED ORDER — CARVEDILOL 6.25 MG PO TABS: 6.2500 mg | ORAL_TABLET | Freq: Two times a day (BID) | ORAL | 0 refills | Status: DC

## 2021-08-01 NOTE — Care Management (Addendum)
Spoke with [name] at bedside regarding home health orders. She is adamant that she does not want home health arranged. She previously declined home health on 07/30/21. States she is going to stay with her daughter. Also states she has all the DME equipment she needs.   Message sent to MD to make aware home health was declined. Nursing was at bedside during writer's encounter.  No further needs assessed.    Tracey Rolling, MSN, RN,BSN Inpatient [facility] Case Manager [phone]

## 2021-08-01 NOTE — Discharge Summary (Signed)
Physician Discharge Summary  Tracey Bryant JKK:[id number] DOB: 08-18-1951 DOA: 07/26/2021  PCP: Coolidge Breeze, FNP  Admit date: 07/26/2021 Discharge date: 08/01/2021  Admitted From: Home Disposition: Home  Recommendations for Outpatient Follow-up:  Follow up with PCP in 1-2 weeks Please obtain BMP/CBC in one week Please follow up with cardiology as scheduled   Discharge Condition: Stable CODE STATUS: Full  Diet recommendation: Low-salt low-fat low-carb diet  Brief/Interim Summary: Tracey Bryant is a 70 y.o. female with a history of T2DM, PAF, DVT, PVD, CVA, HTN, acute cholecystitis s/p cholecystostomy March 2022 who presented to the ED at [facility] with increasing dyspnea, orthopnea, leg swelling, abdominal bloating and also found her drain dislodged just PTA. She was admitted for acute on chronic HFrEF, cardiology consulted, and diuresis is ongoing. AKI developed 8/4 for which diuretic is held.  Heart failure improved with diuretics, appreciate cardiology insight recommendations, dislodged cholecystostomy drain replaced by IR with plans for outpatient monitoring and routine exchange in 3 months.  Patient otherwise stable and agreeable for discharge home today given resolution of hypoxia respiratory symptoms volume overload and cholecystostomy drain dysfunction.  Discharge Diagnoses:   Acute hypoxic respiratory failure due to acute CHF, pleural effusions, atelectasis:  - Hypoxia resolved - continue incentive spirometry.   Acute on chronic HFrEF, HTN: Has clinical evidence of overload, elevated BNP and pulmonary edema on CXR causing hypoxia. LVEF 25-30%, G2DD, decreased RV systolic function as well, severe LAE, pulmonary HTN (ASP 36 mmHg) by recent echo -Continue diuretics per cardiology, close outpatient follow-up -Continue coreg, bidil, diuretics per cardiology; follow up with cardiology in [place] has been arranged for 8/24.   Cholecystitis s/p cholecystostomy: Dislodged PTA and  replaced in IR 8/1. - Routine exchange recommended in 3 months. If not planning surgery, IR plans to refer to [facility] for percutaneous management (spyglass)   IDT2DM with symptomatic hypoglycemia: HbA1c 9.1% earlier this year. 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 - Continue eliquis  Discharge Instructions  Discharge Instructions     Diet - low sodium heart healthy   Complete by: As directed    Increase activity slowly   Complete by: As directed    No wound care   Complete by: As directed       Allergies as of 08/01/2021   No Known Allergies      Medication List     STOP taking these medications    amLODipine 10 MG tablet Commonly known as: NORVASC       TAKE these medications    acetaminophen 500 MG tablet Commonly known as: TYLENOL Take 1 tablet (500 mg total) by mouth every 8 (eight) hours as needed for moderate pain.   apixaban 5 MG Tabs tablet Commonly known as: ELIQUIS Take 5 mg by mouth 2 (two) times daily.   carvedilol 6.25 MG tablet Commonly known as: COREG Take 1 tablet (6.25 mg total) by mouth 2 (two) times daily with a meal. What changed:  medication strength how much to take   ezetimibe 10 MG tablet Commonly known as: ZETIA Take 10 mg by mouth daily.   furosemide 20 MG tablet Commonly known as: LASIX Take 20 mg by mouth daily.  insulin aspart 100 UNIT/ML injection Commonly known as: NovoLOG Substitute to any brand approved.Before each meal 3 times a day, 140-199 - 2 units, 200-250 - 4 units, 251-299 - 6 units,  300-349 - 8 units,  350 or above 10 units. Dispense syringes and needles as needed, Ok to switch to PEN if approved. DX DM2, Code  E11.65   insulin glargine 100 UNIT/ML injection Commonly known as: LANTUS Inject 0.25 mLs (25 Units total) into the skin at bedtime.   isosorbide-hydrALAZINE 20-37.5 MG tablet Commonly known as: BIDIL Take 1 tablet by mouth 3 (three) times daily.   senna-docusate 8.6-50 MG tablet Commonly known as: Senokot-S Take 1 tablet by mouth at bedtime as needed for mild constipation.   tamsulosin 0.4 MG Caps capsule Commonly known as: FLOMAX Take 0.4 mg by mouth every morning.        Follow-up Information     Erma Heritage, PA-C Follow up.   Specialties: Physician Assistant, Cardiology Why: Hospital follow-up with Cardiology scheduled for 08/19/2021 at 3:30pm with Bernerd Pho, PA-C, one of our office's PAs. Please arrive 15 minutes early for check-in. If this date/time does not work for you, please call our office to reschedule. Contact information: [place] [address] [phone]         Evans Lance, MD Follow up.   Specialty: Cardiology Why: Follow-up visit scheduled for 08/18/2021 at 8:30am with Dr. Lovena Le, one of our Electrophysiologist, to get established in our device clinic. Contact information: [place] [address] [phone]         Verta Ellen., NP Follow up on 08/04/2021.   Specialty: Cardiology Why: at 1:30pm for your follow up appt. This first appt is in the [place] office b/c of availablity. Long term follow up will be in the [place] office. Contact information: [place] [address] [phone]                No Known Allergies  Consultations: Cardiology  Procedures/Studies: [facility] Chest Port 1 View  Result Date: 07/26/2021 CLINICAL DATA:  Shortness of breath for 4 days EXAM: PORTABLE CHEST 1 VIEW COMPARISON:  04/06/2021 FINDINGS: Pacer with leads at right atrium and right ventricle. No lead discontinuity. Midline trachea. Moderate cardiomegaly. Probable small bilateral  pleural effusions. No pneumothorax. Pulmonary interstitial prominence and indistinctness is mildly increased. Similar left base opacity. IMPRESSION: Cardiomegaly with worsened interstitial edema and probable small bilateral pleural effusions. Similar left base airspace disease, likely atelectasis. Electronically Signed   By: Abigail Miyamoto M.D.   On: 07/26/2021 16:36     Subjective: No acute issues or events overnight   Discharge Exam: Vitals:   08/01/21 0726 08/01/21 1132  BP: (!) 125/52 122/62  Pulse: 60 60  Resp: 15 19  Temp: 97.8 F (36.6 C) 97.6 F (36.4 C)  SpO2: 95% 91%   Vitals:   08/01/21 0420 08/01/21 0637 08/01/21 0726 08/01/21 1132  BP: 133/60 129/66 (!) 125/52 122/62  Pulse: 63 64 60 60  Resp: 19  15 19   Temp: 97.9 F (36.6 C)  97.8 F (36.6 C) 97.6 F (36.4 C)  TempSrc: Oral  Oral Oral  SpO2: 97%  95% 91%  Weight: 86 kg     Height:        General: Pt is alert, awake, not in acute distress Cardiovascular: Irregularly irregular, S1/S2 +, no rubs, no gallops Respiratory: CTA bilaterally, no wheezing, no rhonchi Abdominal: Soft, NT,  ND, bowel sounds + Extremities: no edema, no cyanosis   The results of significant diagnostics from this hospitalization (including imaging, microbiology, ancillary and laboratory) are listed below for reference.     Microbiology: Recent Results (from the past 240 hour(s))  Resp Panel by RT-PCR (Flu A&B, Covid) Nasopharyngeal Swab     Status: None   Collection Time: 07/26/21  4:02 PM   Specimen: Nasopharyngeal Swab; Nasopharyngeal(NP) swabs in vial transport medium  Result Value Ref Range Status   SARS Coronavirus 2 by RT PCR NEGATIVE NEGATIVE Final    Comment: (NOTE) SARS-CoV-2 target nucleic acids are NOT DETECTED.  The SARS-CoV-2 RNA is generally detectable in upper respiratory specimens during the acute phase of infection. The lowest concentration of SARS-CoV-2 viral copies this assay can detect is 138 copies/mL. A  negative result does not preclude SARS-Cov-2 infection and should not be used as the sole basis for treatment or other patient management decisions. A negative result may occur with  improper specimen collection/handling, submission of specimen other than nasopharyngeal swab, presence of viral mutation(s) within the areas targeted by this assay, and inadequate number of viral copies(<138 copies/mL). A negative result must be combined with clinical observations, patient history, and epidemiological information. The expected result is Negative.  Fact Sheet for Patients:  EntrepreneurPulse.com.au  Fact Sheet for Healthcare Providers:  IncredibleEmployment.be  This test is no t yet approved or cleared by the [place] FDA and  has been authorized for detection and/or diagnosis of SARS-CoV-2 by FDA under an Emergency Use Authorization (EUA). This EUA will remain  in effect (meaning this test can be used) for the duration of the COVID-19 declaration under Section 564(b)(1) of the Act, 21 U.S.C.section 360bbb-3(b)(1), unless the authorization is terminated  or revoked sooner.       Influenza A by PCR NEGATIVE NEGATIVE Final   Influenza B by PCR NEGATIVE NEGATIVE Final    Comment: (NOTE) The Xpert Xpress SARS-CoV-2/FLU/RSV plus assay is intended as an aid in the diagnosis of influenza from Nasopharyngeal swab specimens and should not be used as a sole basis for treatment. Nasal washings and aspirates are unacceptable for Xpert Xpress SARS-CoV-2/FLU/RSV testing.  Fact Sheet for Patients: EntrepreneurPulse.com.au  Fact Sheet for Healthcare Providers: IncredibleEmployment.be  This test is not yet approved or cleared by the [place] FDA and has been authorized for detection and/or diagnosis of SARS-CoV-2 by FDA under an Emergency Use Authorization (EUA). This EUA will remain in effect (meaning this test can  be used) for the duration of the COVID-19 declaration under Section 564(b)(1) of the Act, 21 U.S.C. section 360bbb-3(b)(1), unless the authorization is terminated or revoked.  Performed at [facility], [address], [place], [place] [address]   MRSA Next Gen by PCR, Nasal     Status: None   Collection Time: 07/27/21  1:16 AM   Specimen: Nasal Mucosa; Nasal Swab  Result Value Ref Range Status   MRSA by PCR Next Gen NOT DETECTED NOT DETECTED Final    Comment: (NOTE) The GeneXpert MRSA Assay (FDA approved for NASAL specimens only), is one component of a comprehensive MRSA colonization surveillance program. It is not intended to diagnose MRSA infection nor to guide or monitor treatment for MRSA infections. Test performance is not FDA approved in patients less than 88 years old. Performed at [facility], [place] [address] [place], [place] [address]      Labs: BNP (last 3 results) Recent Labs    04/06/21 0829 04/07/21 0110  07/26/21 1601  BNP 2,116.9* 2,160.0* 6,160.7*   Basic Metabolic Panel: Recent Labs  Lab 07/27/21 1418 07/29/21 0024 07/30/21 0147 07/31/21 0024 08/01/21 0027  NA 136 135 131* 132* 131*  K 3.9 3.9 4.4 4.5 4.7  CL 106 101 100 101 102  CO2 21* 24 23 23  20*  GLUCOSE 156* 185* 188* 145* 131*  BUN 18 20 28* 32* 35*  CREATININE 1.66* 1.84* 2.48* 2.58* 2.43*  CALCIUM 8.4* 8.3* 8.3* 8.4* 8.3*  MG 1.8  --   --   --   --    Liver Function Tests: Recent Labs  Lab 07/26/21 1601  AST 17  ALT 10  ALKPHOS 54  BILITOT 0.9  PROT 6.5  ALBUMIN 2.9*   Recent Labs  Lab 07/26/21 1601  LIPASE 31   No results for input(s): AMMONIA in the last 168 hours. CBC: Recent Labs  Lab 07/26/21 1601 07/27/21 0354 07/27/21 1418  WBC 11.3* 10.6* 10.3  NEUTROABS 8.3*  --   --   HGB 10.1* 10.1* 10.3*  HCT 32.4* 32.2* 32.4*  MCV 96.7 95.3 91.8  PLT 452* 475* 322   Cardiac Enzymes: No results for input(s): CKTOTAL, CKMB, CKMBINDEX, TROPONINI in the last 168  hours. BNP: Invalid input(s): POCBNP CBG: Recent Labs  Lab 07/31/21 1258 07/31/21 1634 07/31/21 2106 08/01/21 0607 08/01/21 1135  GLUCAP 216* 152* 151* 102* 170*   D-Dimer No results for input(s): DDIMER in the last 72 hours. Hgb A1c No results for input(s): HGBA1C in the last 72 hours. Lipid Profile No results for input(s): CHOL, HDL, LDLCALC, TRIG, CHOLHDL, LDLDIRECT in the last 72 hours. Thyroid function studies No results for input(s): TSH, T4TOTAL, T3FREE, THYROIDAB in the last 72 hours.  Invalid input(s): FREET3 Anemia work up No results for input(s): VITAMINB12, FOLATE, FERRITIN, TIBC, IRON, RETICCTPCT in the last 72 hours. Urinalysis    Component Value Date/Time   COLORURINE AMBER (A) 03/17/2021 1208   APPEARANCEUR CLOUDY (A) 03/17/2021 1208   LABSPEC 1.015 03/17/2021 1208   PHURINE 5.0 03/17/2021 1208   GLUCOSEU 50 (A) 03/17/2021 1208   HGBUR SMALL (A) 03/17/2021 1208   BILIRUBINUR NEGATIVE 03/17/2021 1208   KETONESUR NEGATIVE 03/17/2021 1208   PROTEINUR >=300 (A) 03/17/2021 1208   UROBILINOGEN 0.2 01/17/2013 1910   NITRITE NEGATIVE 03/17/2021 1208   LEUKOCYTESUR LARGE (A) 03/17/2021 1208   Sepsis Labs Invalid input(s): PROCALCITONIN,  WBC,  LACTICIDVEN Microbiology Recent Results (from the past 240 hour(s))  Resp Panel by RT-PCR (Flu A&B, Covid) Nasopharyngeal Swab     Status: None   Collection Time: 07/26/21  4:02 PM   Specimen: Nasopharyngeal Swab; Nasopharyngeal(NP) swabs in vial transport medium  Result Value Ref Range Status   SARS Coronavirus 2 by RT PCR NEGATIVE NEGATIVE Final    Comment: (NOTE) SARS-CoV-2 target nucleic acids are NOT DETECTED.  The SARS-CoV-2 RNA is generally detectable in upper respiratory specimens during the acute phase of infection. The lowest concentration of SARS-CoV-2 viral copies this assay can detect is 138 copies/mL. A negative result does not preclude SARS-Cov-2 infection and should not be used as the sole basis  for treatment or other patient management decisions. A negative result may occur with  improper specimen collection/handling, submission of specimen other than nasopharyngeal swab, presence of viral mutation(s) within the areas targeted by this assay, and inadequate number of viral copies(<138 copies/mL). A negative result must be combined with clinical observations, patient history, and epidemiological information. The expected result is Negative.  Fact Sheet for  Patients:  EntrepreneurPulse.com.au  Fact Sheet for Healthcare Providers:  IncredibleEmployment.be  This test is no t yet approved or cleared by the [place] FDA and  has been authorized for detection and/or diagnosis of SARS-CoV-2 by FDA under an Emergency Use Authorization (EUA). This EUA will remain  in effect (meaning this test can be used) for the duration of the COVID-19 declaration under Section 564(b)(1) of the Act, 21 U.S.C.section 360bbb-3(b)(1), unless the authorization is terminated  or revoked sooner.       Influenza A by PCR NEGATIVE NEGATIVE Final   Influenza B by PCR NEGATIVE NEGATIVE Final    Comment: (NOTE) The Xpert Xpress SARS-CoV-2/FLU/RSV plus assay is intended as an aid in the diagnosis of influenza from Nasopharyngeal swab specimens and should not be used as a sole basis for treatment. Nasal washings and aspirates are unacceptable for Xpert Xpress SARS-CoV-2/FLU/RSV testing.  Fact Sheet for Patients: EntrepreneurPulse.com.au  Fact Sheet for Healthcare Providers: IncredibleEmployment.be  This test is not yet approved or cleared by the [place] FDA and has been authorized for detection and/or diagnosis of SARS-CoV-2 by FDA under an Emergency Use Authorization (EUA). This EUA will remain in effect (meaning this test can be used) for the duration of the COVID-19 declaration under Section 564(b)(1) of the Act, 21  U.S.C. section 360bbb-3(b)(1), unless the authorization is terminated or revoked.  Performed at [facility], [address], [place], [place] [address]   MRSA Next Gen by PCR, Nasal     Status: None   Collection Time: 07/27/21  1:16 AM   Specimen: Nasal Mucosa; Nasal Swab  Result Value Ref Range Status   MRSA by PCR Next Gen NOT DETECTED NOT DETECTED Final    Comment: (NOTE) The GeneXpert MRSA Assay (FDA approved for NASAL specimens only), is one component of a comprehensive MRSA colonization surveillance program. It is not intended to diagnose MRSA infection nor to guide or monitor treatment for MRSA infections. Test performance is not FDA approved in patients less than 9 years old. Performed at [facility], [place] [address] [place], [place] [address]      Time coordinating discharge: Over 30 minutes  SIGNED:   [name], DO [facility] 08/01/2021, 11:47 AM Pager   If 7PM-7AM, please contact night-coverage [email or web address]

## 2021-08-01 NOTE — Progress Notes (Signed)
Progress Note  Patient Name: Tracey Bryant Date of Encounter: 08/01/2021  [facility] Cardiologist: New (Prefers to follow up at [facility] office.)  Subjective   She is doing much better.  Breathing is improved.  No pain.  Less swelling  Inpatient Medications    Scheduled Meds:  apixaban  5 mg Oral BID   carvedilol  6.25 mg Oral BID WC   ezetimibe  10 mg Oral Daily   insulin aspart  0-15 Units Subcutaneous TID WC   insulin aspart  0-5 Units Subcutaneous QHS   insulin glargine-yfgn  10 Units Subcutaneous QHS   isosorbide-hydrALAZINE  1 tablet Oral TID   mouth rinse  15 mL Mouth Rinse BID   tamsulosin  0.4 mg Oral q morning   Continuous Infusions:  PRN Meds: acetaminophen **OR** acetaminophen, ondansetron **OR** ondansetron (ZOFRAN) IV, senna-docusate   Vital Signs    Vitals:   08/01/21 0300 08/01/21 0420 08/01/21 0637 08/01/21 0726  BP:  133/60 129/66 (!) 125/52  Pulse: 61 63 64 60  Resp: 15 19  15   Temp:  97.9 F (36.6 C)  97.8 F (36.6 C)  TempSrc:  Oral  Oral  SpO2: 94% 97%  95%  Weight:  86 kg    Height:        Intake/Output Summary (Last 24 hours) at 08/01/2021 0822 Last data filed at 08/01/2021 0438 Gross per 24 hour  Intake --  Output 600 ml  Net -600 ml   Last 3 Weights 08/01/2021 07/31/2021 07/30/2021  Weight (lbs) 189 lb 9.5 oz 192 lb 0.3 oz 191 lb 5.8 oz  Weight (kg) 86 kg 87.1 kg 86.8 kg      Telemetry     NSR, atrial paced- Personally Reviewed  ECG    N/A  - Personally Reviewed  Physical Exam   GEN: No  acute distress.   Neck: No  JVD Cardiac: RRR, no murmurs, rubs, or gallops.  Respiratory: Clear   to auscultation bilaterally. GI: Soft, nontender, non-distended, normal bowel sounds  MS:   Mild leg edema; No deformity. Neuro:   Nonfocal  Psych: Oriented and appropriate    Labs    High Sensitivity Troponin:   Recent Labs  Lab 07/26/21 1601 07/26/21 1745  TROPONINIHS 14 15      Chemistry Recent Labs  Lab 07/26/21 1601  07/27/21 1418 07/30/21 0147 07/31/21 0024 08/01/21 0027  NA 138   < > 131* 132* 131*  K 4.0   < > 4.4 4.5 4.7  CL 111   < > 100 101 102  CO2 20*   < > 23 23 20*  GLUCOSE 78   < > 188* 145* 131*  BUN 23   < > 28* 32* 35*  CREATININE 1.78*   < > 2.48* 2.58* 2.43*  CALCIUM 8.4*   < > 8.3* 8.4* 8.3*  PROT 6.5  --   --   --   --   ALBUMIN 2.9*  --   --   --   --   AST 17  --   --   --   --   ALT 10  --   --   --   --   ALKPHOS 54  --   --   --   --   BILITOT 0.9  --   --   --   --   GFRNONAA 31*   < > 21* 20* 21*  ANIONGAP 7   < > 8 8  9   < > = values in this interval not displayed.     Hematology Recent Labs  Lab 07/26/21 1601 07/27/21 0354 07/27/21 1418  WBC 11.3* 10.6* 10.3  RBC 3.35* 3.38* 3.53*  HGB 10.1* 10.1* 10.3*  HCT 32.4* 32.2* 32.4*  MCV 96.7 95.3 91.8  MCH 30.1 29.9 29.2  MCHC 31.2 31.4 31.8  RDW 17.6* 17.4* 17.3*  PLT 452* 475* 322    BNP Recent Labs  Lab 07/26/21 1601  BNP 1,291.0*     DDimer No results for input(s): DDIMER in the last 168 hours.   Radiology    No results found.  Cardiac Studies   Echocardiogram from 03/18/2021:   1. Left ventricular ejection fraction, by estimation, is 25 to 30%. The  left ventricle has normal function. The left ventricle demonstrates global  hypokinesis. There is mild left ventricular hypertrophy. Left ventricular  diastolic parameters are  consistent with Grade II diastolic dysfunction (pseudonormalization).  Elevated left atrial pressure.   2. RV poorly visualized, grossly appears enlarged with decreased systolic  function. . Right ventricular systolic function was not well visualized.  The right ventricular size is not well visualized.   3. Left atrial size was severely dilated.   4. The mitral valve is normal in structure. No evidence of mitral valve  regurgitation. No evidence of mitral stenosis.   5. The aortic valve is tricuspid. There is mild calcification of the  aortic valve. There is mild  thickening of the aortic valve. Aortic valve  regurgitation is not visualized. No aortic stenosis is present.   6. Mild pulmonary HTN, PASP is 36 mmHg.   7. The inferior vena cava is normal in size with greater than 50%  respiratory variability, suggesting right atrial pressure of 3 mmHg.    Patient Profile     Tracey Bryant is a 70 y.o. 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   FYI patient is Jehovah's Witness and refuses blood transfusion.  Assessment & Plan    Acute on chronic systolic and diastolic heart failure Cardiomyopathy Net negative 6.5 liters plus.   Na is down and I will talk about free water restriction.  She will likely need at least 20 mg of PO Lasix at home and extra 20 with 2 lb weight gain.  Needs 48 ounce fluid restriction.  OK to go home.  We are trying to arrange close follow up but transportation is an issue.  She will need labs next week.   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  Follow as above.    Paroxysmal atrial fibrillation History of Medtronic PPM implant 05/19/20 NSR.  No change in therapy.    We will arrange device follow up .    Hypertension This is being managed in the context of treating his CHF    For questions or updates, please contact [place] Please consult [email or web address] for contact info under        Signed, Minus Breeding, MD  08/01/2021, 8:22 AM

## 2021-08-01 NOTE — Progress Notes (Signed)
Mobility Specialist: Progress Note   08/01/21 1634  Mobility  Activity Ambulated in hall  Level of Assistance Modified independent, requires aide device or extra time  Assistive Device Four wheel walker  Distance Ambulated (ft) 340 ft  Mobility Ambulated with assistance in hallway  Mobility Response Tolerated well  Mobility performed by Mobility specialist  $Mobility charge 1 Mobility   Pre-Mobility: 60 HR, 97% SpO2 During Mobility: 111 HR Post-Mobility: 75 HR, 96% SpO2  Pt c/o feeling a little SOB during ambulation, otherwise asx. Pt with fast gait today stating she is feeling much better. Pt back to bed after walk with call bell in her lap.   [facility] Tracey Bryant Mobility Specialist Mobility Specialist Phone: [phone]

## 2021-08-03 ENCOUNTER — Telehealth: Payer: Self-pay | Admitting: Licensed Clinical Social Worker

## 2021-08-03 NOTE — Telephone Encounter (Signed)
CSW contacted patient to arrange transportation to appointment tomorrow in the [place] office. Message left for return call. Raquel Sarna, [place], [place]

## 2021-08-03 NOTE — Progress Notes (Deleted)
Cardiology Office Note  Date: 08/03/2021   ID: Tracey Bryant, DOB 12-Sep-1951, MRN [id number]  PCP:  Coolidge Breeze, FNP  Cardiologist:  None Electrophysiologist:  None   Chief Complaint: Hospital follow-up  History of Present Illness: Tracey Bryant is a 70 y.o. female with a history of acute on chronic combined systolic and diastolic heart failure, CVA, HTN, rheumatoid arthritis, paroxysmal atrial fibrillation.  DM2, DVT, PVD  She presented to [facility], ED with increasing dyspnea, orthopnea, lower extremity edema, abdominal bloating.  She was admitted for acute on chronic HFrEF on 07/26/2021.  BNP was elevated.  She had evidence of pulmonary edema on chest x-ray.  LVEF was 25 to 30% with grade 2 diastolic dysfunction.  Decreased RV systolic function, severe LAE, pulmonary hypertension with PASP of 36 mmHg.  Cardiology was consulted and diuresis was ongoing.  Heart failure improved with diuretics.  She recently had a cholecystectomy with drain placed.  Her drain had dislodged prior to admission and was replaced during admission.  She was to continue Coreg, BiDil and diuretics.  She had AKI on stage IIIa CKD which was stabilizing.  Plans were to repeat labs with PCP and cardiology as scheduled.  She was continuing Coreg for PAF status post PPM.  She was continuing Eliquis.  Had a recent history of popliteal DVT on April 2022 with CVA in April as well.  Plans were to continue anticoagulation.     Past Medical History:  Diagnosis Date   Arthritis    Cardiomyopathy ([place])    Diabetes mellitus without complication ([place])    DVT (deep venous thrombosis) ([place]) 07/26/2021   Hypertension    Paroxysmal atrial fibrillation ([place]) 07/26/2021   Stroke [facility])     Past Surgical History:  Procedure Laterality Date   breast tumor     IR CHOLANGIOGRAM EXISTING TUBE  05/13/2021   PACEMAKER IMPLANT     TONSILLECTOMY      Current Outpatient Medications  Medication Sig Dispense Refill    acetaminophen (TYLENOL) 500 MG tablet Take 1 tablet (500 mg total) by mouth every 8 (eight) hours as needed for moderate pain. 20 tablet 0   apixaban (ELIQUIS) 5 MG TABS tablet Take 5 mg by mouth 2 (two) times daily.     carvedilol (COREG) 6.25 MG tablet Take 1 tablet (6.25 mg total) by mouth 2 (two) times daily with a meal. 60 tablet 0   ezetimibe (ZETIA) 10 MG tablet Take 10 mg by mouth daily.     furosemide (LASIX) 20 MG tablet Take 20 mg by mouth daily.     insulin aspart (NOVOLOG) 100 UNIT/ML injection Substitute to any brand approved.Before each meal 3 times a day, 140-199 - 2 units, 200-250 - 4 units, 251-299 - 6 units,  300-349 - 8 units,  350 or above 10 units. Dispense syringes and needles as needed, Ok to switch to PEN if approved. DX DM2, Code E11.65  0   insulin glargine (LANTUS) 100 UNIT/ML injection Inject 0.25 mLs (25 Units total) into the skin at bedtime. 10 mL 0   isosorbide-hydrALAZINE (BIDIL) 20-37.5 MG tablet Take 1 tablet by mouth 3 (three) times daily. 90 tablet 0   senna-docusate (SENOKOT-S) 8.6-50 MG tablet Take 1 tablet by mouth at bedtime as needed for mild constipation.     tamsulosin (FLOMAX) 0.4 MG CAPS capsule Take 0.4 mg by mouth every morning.     No current facility-administered medications for this visit.   Allergies:  Patient  has no known allergies.   Social History: The patient  reports that she has never smoked. She has never used smokeless tobacco. She reports that she does not drink alcohol and does not use drugs.   Family History: The patient's family history includes Cancer in her mother; Diabetes in her mother; Heart disease in her father.   ROS:  Please see the history of present illness. Otherwise, complete review of systems is positive for {NONE DEFAULTED:18576}.  All other systems are reviewed and negative.   Physical Exam: VS:  There were no vitals taken for this visit., BMI There is no height or weight on file to calculate BMI.  Wt Readings  from Last 3 Encounters:  08/01/21 189 lb 9.5 oz (86 kg)  04/07/21 173 lb 15.1 oz (78.9 kg)  03/15/21 165 lb (74.8 kg)    General: Patient appears comfortable at rest. HEENT: Conjunctiva and lids normal, oropharynx clear with moist mucosa. Neck: Supple, no elevated JVP or carotid bruits, no thyromegaly. Lungs: Clear to auscultation, nonlabored breathing at rest. Cardiac: Regular rate and rhythm, no S3 or significant systolic murmur, no pericardial rub. Abdomen: Soft, nontender, no hepatomegaly, bowel sounds present, no guarding or rebound. Extremities: No pitting edema, distal pulses 2+. Skin: Warm and dry. Musculoskeletal: No kyphosis. Neuropsychiatric: Alert and oriented x3, affect grossly appropriate.  ECG:  {EKG/Telemetry Strips Reviewed:[id number]}  Recent Labwork: 07/26/2021: ALT 10; AST 17; B Natriuretic Peptide 1,291.0 07/27/2021: Hemoglobin 10.3; Magnesium 1.8; Platelets 322 08/01/2021: BUN 35; Creatinine, Ser 2.43; Potassium 4.7; Sodium 131     Component Value Date/Time   CHOL 92 04/05/2021 0034   TRIG 82 04/05/2021 0034   HDL 37 (L) 04/05/2021 0034   CHOLHDL 2.5 04/05/2021 0034   VLDL 16 04/05/2021 0034   LDLCALC 39 04/05/2021 0034    Other Studies Reviewed Today:  Carotid artery duplex study 04/04/2021 Right Carotid: The extracranial vessels were near-normal with only minimal wall thickening or plaque. Left Carotid: The extracranial vessels were near-normal with only minimal wall thickening or plaque. Vertebrals: Bilateral vertebral arteries demonstrate antegrade flow. Subclavians: Normal flow hemodynamics were seen in bilateral subclavian arteries     Lower extremity venous doppler 03/28/2021 Summary: RIGHT: - There is no evidence of deep vein thrombosis in the lower extremity. However, portions of this examination were limited- see technologist comments above. - No cystic structure found in the popliteal fossa. LEFT: - Findings consistent with age indeterminate  deep vein thrombosis involving the left popliteal vein. - No cystic structure found in the popliteal fossa. - Ultrasound characteristics of enlarged lymph nodes noted in the groin.      Echocardiogram 03/18/2021  1. Left ventricular ejection fraction, by estimation, is 25 to 30%. The left ventricle has normal function. The left ventricle demonstrates global hypokinesis. There is mild left ventricular hypertrophy. Left ventricular diastolic parameters are consistent with Grade II diastolic dysfunction (pseudonormalization). Elevated left atrial pressure. 2. RV poorly visualized, grossly appears enlarged with decreased systolic function. . Right ventricular systolic function was not well visualized. The right ventricular size is not well visualized. 3. Left atrial size was severely dilated. 4. The mitral valve is normal in structure. No evidence of mitral valve regurgitation. No evidence of mitral stenosis. 5. The aortic valve is tricuspid. There is mild calcification of the aortic valve. There is mild thickening of the aortic valve. Aortic valve regurgitation is not visualized. No aortic stenosis is present. 6. Mild pulmonary HTN, PASP is 36 mmHg. 7. The inferior vena cava is normal  in size with greater than 50% respiratory variability, suggesting right atrial pressure of 3 mmHg. Assessment and Plan:  1. Chronic combined systolic (congestive) and diastolic (congestive) heart failure (HCC)   2. Paroxysmal atrial fibrillation ([place])   3. Chronic deep vein thrombosis (DVT) of popliteal vein of left lower extremity (HCC)   4. AKI (acute kidney injury) ([place])      Medication Adjustments/Labs and Tests Ordered: Current medicines are reviewed at length with the patient today.  Concerns regarding medicines are outlined above.   Disposition: Follow-up with ***  Signed, Levell July, NP 08/03/2021 11:17 PM    [place] [facility] [place], [place], [place]  [address] Phone: [phone]; Fax: [phone]

## 2021-08-04 ENCOUNTER — Encounter (HOSPITAL_COMMUNITY): Payer: Self-pay

## 2021-08-04 ENCOUNTER — Emergency Department (HOSPITAL_COMMUNITY): Payer: Medicare HMO

## 2021-08-04 ENCOUNTER — Ambulatory Visit: Payer: Medicare HMO | Admitting: Family Medicine

## 2021-08-04 ENCOUNTER — Emergency Department (HOSPITAL_COMMUNITY)
Admission: EM | Admit: 2021-08-04 | Discharge: 2021-08-04 | Disposition: A | Discharge status: Home / Self Care | Payer: Medicare HMO | Attending: Emergency Medicine | Admitting: Emergency Medicine

## 2021-08-04 ENCOUNTER — Other Ambulatory Visit: Payer: Self-pay

## 2021-08-04 ENCOUNTER — Encounter (HOSPITAL_COMMUNITY): Payer: Medicare HMO

## 2021-08-04 DIAGNOSIS — I5042 Chronic combined systolic (congestive) and diastolic (congestive) heart failure: Secondary | ICD-10-CM

## 2021-08-04 DIAGNOSIS — I110 Hypertensive heart disease with heart failure: Secondary | ICD-10-CM | POA: Diagnosis not present

## 2021-08-04 DIAGNOSIS — Z7901 Long term (current) use of anticoagulants: Secondary | ICD-10-CM | POA: Diagnosis not present

## 2021-08-04 DIAGNOSIS — E1110 Type 2 diabetes mellitus with ketoacidosis without coma: Secondary | ICD-10-CM | POA: Insufficient documentation

## 2021-08-04 DIAGNOSIS — Z794 Long term (current) use of insulin: Secondary | ICD-10-CM | POA: Insufficient documentation

## 2021-08-04 DIAGNOSIS — R0602 Shortness of breath: Secondary | ICD-10-CM | POA: Diagnosis not present

## 2021-08-04 DIAGNOSIS — I5043 Acute on chronic combined systolic (congestive) and diastolic (congestive) heart failure: Secondary | ICD-10-CM | POA: Insufficient documentation

## 2021-08-04 DIAGNOSIS — R0600 Dyspnea, unspecified: Secondary | ICD-10-CM

## 2021-08-04 DIAGNOSIS — Z28310 Unvaccinated for covid-19: Secondary | ICD-10-CM | POA: Diagnosis not present

## 2021-08-04 DIAGNOSIS — Z950 Presence of cardiac pacemaker: Secondary | ICD-10-CM | POA: Diagnosis not present

## 2021-08-04 DIAGNOSIS — R609 Edema, unspecified: Secondary | ICD-10-CM | POA: Diagnosis not present

## 2021-08-04 DIAGNOSIS — I82532 Chronic embolism and thrombosis of left popliteal vein: Secondary | ICD-10-CM

## 2021-08-04 DIAGNOSIS — N179 Acute kidney failure, unspecified: Secondary | ICD-10-CM

## 2021-08-04 DIAGNOSIS — R531 Weakness: Secondary | ICD-10-CM | POA: Insufficient documentation

## 2021-08-04 DIAGNOSIS — I480 Paroxysmal atrial fibrillation: Secondary | ICD-10-CM | POA: Insufficient documentation

## 2021-08-04 LAB — CBC WITH DIFFERENTIAL/PLATELET
Abs Immature Granulocytes: 0.03 10*3/uL (ref 0.00–0.07)
Basophils Absolute: 0.1 10*3/uL (ref 0.0–0.1)
Basophils Relative: 1 %
Eosinophils Absolute: 0.7 10*3/uL — ABNORMAL HIGH (ref 0.0–0.5)
Eosinophils Relative: 7 %
HCT: 31.7 % — ABNORMAL LOW (ref 36.0–46.0)
Hemoglobin: 9.7 g/dL — ABNORMAL LOW (ref 12.0–15.0)
Immature Granulocytes: 0 %
Lymphocytes Relative: 15 %
Lymphs Abs: 1.4 10*3/uL (ref 0.7–4.0)
MCH: 29.4 pg (ref 26.0–34.0)
MCHC: 30.6 g/dL (ref 30.0–36.0)
MCV: 96.1 fL (ref 80.0–100.0)
Monocytes Absolute: 0.8 10*3/uL (ref 0.1–1.0)
Monocytes Relative: 9 %
Neutro Abs: 6.6 10*3/uL (ref 1.7–7.7)
Neutrophils Relative %: 68 %
Platelets: 387 10*3/uL (ref 150–400)
RBC: 3.3 MIL/uL — ABNORMAL LOW (ref 3.87–5.11)
RDW: 16.4 % — ABNORMAL HIGH (ref 11.5–15.5)
WBC: 9.7 10*3/uL (ref 4.0–10.5)
nRBC: 0 % (ref 0.0–0.2)

## 2021-08-04 LAB — COMPREHENSIVE METABOLIC PANEL
ALT: 13 U/L (ref 0–44)
AST: 19 U/L (ref 15–41)
Albumin: 2.9 g/dL — ABNORMAL LOW (ref 3.5–5.0)
Alkaline Phosphatase: 55 U/L (ref 38–126)
Anion gap: 9 (ref 5–15)
BUN: 26 mg/dL — ABNORMAL HIGH (ref 8–23)
CO2: 20 mmol/L — ABNORMAL LOW (ref 22–32)
Calcium: 8.5 mg/dL — ABNORMAL LOW (ref 8.9–10.3)
Chloride: 107 mmol/L (ref 98–111)
Creatinine, Ser: 1.86 mg/dL — ABNORMAL HIGH (ref 0.44–1.00)
GFR, Estimated: 29 mL/min — ABNORMAL LOW (ref >60)
Glucose, Bld: 53 mg/dL — ABNORMAL LOW (ref 70–99)
Potassium: 4.4 mmol/L (ref 3.5–5.1)
Sodium: 136 mmol/L (ref 135–145)
Total Bilirubin: 0.7 mg/dL (ref 0.3–1.2)
Total Protein: 7 g/dL (ref 6.5–8.1)

## 2021-08-04 LAB — TROPONIN I (HIGH SENSITIVITY)
Troponin I (High Sensitivity): 12 ng/L (ref <18)
Troponin I (High Sensitivity): 12 ng/L (ref <18)

## 2021-08-04 LAB — BRAIN NATRIURETIC PEPTIDE: B Natriuretic Peptide: 942.4 pg/mL — ABNORMAL HIGH (ref 0.0–100.0)

## 2021-08-04 MED ORDER — FUROSEMIDE 20 MG PO TABS: 20.0000 mg | ORAL_TABLET | Freq: Every day | ORAL | 0 refills | Status: DC

## 2021-08-04 MED ORDER — APIXABAN 5 MG PO TABS
5.0000 mg | ORAL_TABLET | ORAL | Status: AC
  Administered 2021-08-04: 5 mg via ORAL
  Filled 2021-08-04: qty 1

## 2021-08-04 MED ORDER — APIXABAN 5 MG PO TABS: 5.0000 mg | ORAL_TABLET | Freq: Two times a day (BID) | ORAL | 0 refills | Status: DC

## 2021-08-04 MED ORDER — FUROSEMIDE 10 MG/ML IJ SOLN
80.0000 mg | Freq: Once | INTRAMUSCULAR | Status: AC
  Administered 2021-08-04: 80 mg via INTRAVENOUS
  Filled 2021-08-04: qty 8

## 2021-08-04 NOTE — ED Provider Notes (Signed)
Emergency Medicine Provider Triage Evaluation Note  Tracey Bryant , a 70 y.o. female  was evaluated in triage.  Pt complains of worsening shortness of breath.  Patient was discharged from the hospital on Saturday, and evaluated by her PCP today reports worsening shortness of breath.  Most of the history is obtained by her son Tracey Bryant at the bedside.  He reports patient had Lasix discontinued by the discharge team, there is worsening swelling to her legs.  Advised by PCP to be seen in the ED.  Review of Systems  Positive: Shortness of breath, leg swelling Negative: Fever, cough, chest pain  Physical Exam  BP (!) 147/62 (BP Location: Left Arm)   Pulse 60   Temp 97.8 F (36.6 C) (Oral)   Resp 20   Ht 5\' 6"  (1.676 m)   Wt 73.5 kg   SpO2 97%   BMI 26.15 kg/m  Gen:   Awake, ill-appearing Resp:  Normal effort  MSK:   Moves extremities without difficulty  Other:  2+ pitting edema BLLE  Medical Decision Making  Medically screening exam initiated at 1:30 PM.  Appropriate orders placed.  Tracey Bryant was informed that the remainder of the evaluation will be completed by another provider, this initial triage assessment does not replace that evaluation, and the importance of remaining in the ED until their evaluation is complete.     Janeece Fitting, PA-C 08/04/21 [place], [place], DO 08/04/21 1751

## 2021-08-04 NOTE — ED Triage Notes (Signed)
Pt endorses worsening SOB since being discharged on Saturday. Pt denies CP. States she thinks she has fluid on her lungs. Pt states she is not compliant with lasix. Pt also endorses nausea.

## 2021-08-04 NOTE — Discharge Instructions (Addendum)
Your heart failure appears to be improving with decreased markers in your blood of heart failure and an improved chest x-Marquette Piontek. Your oxygen level here today is 98%. You are given an additional dose of your uretic here in the ED You have had a COVID test done.  Please follow-up on MyChart for test results and call your doctor if you have a positive test as you are at candidate for treatment for COVID. Please return if you are having worsening symptoms at any time Prescription for your Lasix and your Eliquis have been sent to your pharmacy

## 2021-08-04 NOTE — ED Provider Notes (Addendum)
[facility] EMERGENCY DEPARTMENT Provider Note   CSN: [id number] Arrival date & time: 08/04/21  1115     History Chief Complaint  Patient presents with   Shortness of Breath    Tracey Bryant is a 70 y.o. female.  HPI 70 year old female history of DVT, diabetes, paroxysmal A. fib, CHF presents today with weakness and shortness of breath.  She was discharged on Saturday after admission for CHF.  At that time she was hypoxic.  She was treated with diuretics, her cholecystostomy tube was displaced and that was replaced by IR.  Patient was to continue her diuretics per cardiology referral, continue her Coreg, Bentyl, and diuretics and follow-up with them on August 24.  She has been taking her Eliquis as prescribed.  She reports that she was doing okay after discharge until this morning when she felt more weak and somewhat more short of breath than usual.  She was seen by her primary care doctor and told to return to the hospital for reevaluation.  She reports taking medications as prescribed.  She reports that she has not had COVID and has not been vaccinated.    Past Medical History:  Diagnosis Date   Arthritis    Cardiomyopathy ([place])    Diabetes mellitus without complication ([place])    DVT (deep venous thrombosis) ([place]) 07/26/2021   Hypertension    Paroxysmal atrial fibrillation ([place]) 07/26/2021   Stroke [facility])     Patient Active Problem List   Diagnosis Date Noted   Acute on chronic HFrEF (heart failure with reduced ejection fraction) ([place]) 07/27/2021   Acute on chronic combined systolic and diastolic congestive heart failure ([place]) 07/26/2021   Paroxysmal atrial fibrillation ([place]) 07/26/2021   DVT (deep venous thrombosis) ([place]) 07/26/2021   Normocytic anemia 07/26/2021   Cholecystostomy tube dysfunction 07/26/2021   Moderate protein-calorie malnutrition ([place]) 07/26/2021   Posterior cerebral artery embolism, right    Severe sepsis ([place]) 03/19/2021   Acute  cholecystitis 03/19/2021   Acute lower UTI 03/19/2021   Cholelithiasis without cholangitis 03/18/2021   Uncontrolled type 2 diabetes mellitus with hyperglycemia, with long-term current use of insulin ([place]) 03/18/2021   DKA (diabetic ketoacidosis) ([place]) 03/16/2021   AKI (acute kidney injury) ([place]) 03/16/2021   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     Family History  Problem Relation Age of Onset   Cancer Mother    Diabetes Mother    Heart disease Father     Social History   Tobacco Use   Smoking status: Never   Smokeless tobacco: Never  Vaping Use   Vaping Use: Never used  Substance Use Topics   Alcohol use: No   Drug use: Never    Home Medications Prior to Admission medications   Medication Sig Start Date End Date Taking? Authorizing Provider  acetaminophen (TYLENOL) 500 MG tablet Take 1 tablet (500 mg total) by mouth every 8 (  eight) hours as needed for moderate pain. 04/08/21 04/08/22  [name], MD  apixaban (ELIQUIS) 5 MG TABS tablet Take 5 mg by mouth 2 (two) times daily.    [provider]  carvedilol (COREG) 6.25 MG tablet Take 1 tablet (6.25 mg total) by mouth 2 (two) times daily with a meal. 08/01/21   [name], MD  ezetimibe (ZETIA) 10 MG tablet Take 10 mg by mouth daily.    [provider]  furosemide (LASIX) 20 MG tablet Take 20 mg by mouth daily.    [provider]  insulin aspart (NOVOLOG) 100 UNIT/ML injection Substitute to any brand approved.Before each meal 3 times a day, 140-199 - 2 units, 200-250 - 4 units, 251-299 - 6 units,  300-349 - 8 units,  350 or above 10 units. Dispense syringes and needles as needed, Ok to switch to PEN if approved. DX DM2, Code E11.65 04/08/21   [name], MD  insulin glargine (LANTUS) 100 UNIT/ML injection Inject 0.25 mLs (25 Units total) into the skin at bedtime. 04/08/21   [name], MD  isosorbide-hydrALAZINE (BIDIL) 20-37.5 MG tablet Take 1 tablet by mouth 3 (three) times daily. 08/01/21   [name], MD  senna-docusate (SENOKOT-S) 8.6-50 MG tablet Take 1 tablet by mouth at bedtime as needed for mild constipation. 04/08/21   [name], MD  tamsulosin (FLOMAX) 0.4 MG CAPS capsule Take 0.4 mg by mouth every morning. 04/20/21   [provider]    Allergies    Patient has no known allergies.  Review of Systems   Review of Systems  Constitutional:  Positive for fatigue.  Respiratory:  Positive for shortness of breath.   Cardiovascular:  Negative for chest pain.  Gastrointestinal:  Negative for abdominal distention and abdominal pain.  Endocrine: Negative.   Genitourinary: Negative.   Neurological:  Positive for weakness.  All other systems reviewed and are negative.  Physical Exam Updated Vital Signs BP (!) 175/71 (BP Location: Left Arm)   Pulse 61   Temp 98.6 F (37 C) (Oral)   Resp 18   Ht 1.676 m (5\' 6" )   Wt 73.5 kg   SpO2 97%   BMI 26.15 kg/m   Physical Exam Vitals and nursing note reviewed.  Constitutional:      General: She is not in acute distress.    Appearance: She is well-developed.  HENT:     Head: Normocephalic.     Mouth/Throat:     Mouth: Mucous membranes are moist.  Eyes:     Pupils: Pupils are equal, round, and reactive to light.  Cardiovascular:     Rate and Rhythm: Normal rate and regular rhythm.  Pulmonary:     Effort: Pulmonary effort  is normal.     Breath sounds: Normal breath sounds.  Chest:     Chest wall: No mass or deformity.  Abdominal:     Palpations: Abdomen is soft.  Musculoskeletal:        General: Normal range of motion.     Cervical back: Normal range of motion.     Comments: Base edema bilateral lower extremity  Skin:    General: Skin is warm and dry.     Capillary Refill: Capillary refill takes less than 2 seconds.  Neurological:     General: No focal deficit present.     Mental Status: She is alert.  Psychiatric:        Mood and Affect: Mood normal.  ED Results / Procedures / Treatments   Labs (all labs ordered are listed, but only abnormal results are displayed) Labs Reviewed  CBC WITH DIFFERENTIAL/PLATELET - Abnormal; Notable for the following components:      Result Value   RBC 3.30 (*)    Hemoglobin 9.7 (*)    HCT 31.7 (*)    RDW 16.4 (*)    Eosinophils Absolute 0.7 (*)    All other components within normal limits  COMPREHENSIVE METABOLIC PANEL - Abnormal; Notable for the following components:   CO2 20 (*)    Glucose, Bld 53 (*)    BUN 26 (*)    Creatinine, Ser 1.86 (*)    Calcium 8.5 (*)    Albumin 2.9 (*)    GFR, Estimated 29 (*)    All other components within normal limits  BRAIN NATRIURETIC PEPTIDE - Abnormal; Notable for the following components:   B Natriuretic Peptide 942.4 (*)    All other components within normal limits  TROPONIN I (HIGH SENSITIVITY)  TROPONIN I (HIGH SENSITIVITY)    EKG EKG Interpretation  Date/Time:  Tuesday August 04 2021 13:26:03 EDT Ventricular Rate:  62 PR Interval:  260 QRS Duration: 92 QT Interval:  476 QTC Calculation: 483 R Axis:   12 Text Interpretation: Atrial-paced rhythm with prolonged AV conduction Anterolateral infarct , age undetermined Abnormal ECG Confirmed by Pattricia Boss [phone]) on 08/04/2021 7:06:37 PM  Radiology DG Chest 2 View  Result Date: 08/04/2021 CLINICAL DATA:  Worsening shortness of breath since hospital  discharge 3 days ago. EXAM: CHEST - 2 VIEW COMPARISON:  Radiograph 07/26/2021 and 04/06/2021.  CT 07/24/2013. FINDINGS: 1339 hours. Left subclavian pacemaker leads are unchanged at the right atrium and right ventricle. Stable cardiomegaly and aortic atherosclerosis. The diffuse interstitial prominence seen on the most recent study has improved. There is no confluent airspace opacity, edema or pneumothorax. There are probable small residual bilateral pleural effusions. The bones appear unchanged. IMPRESSION: Interval improved interstitial prominence, likely due to resolving edema/inflammation. Probable small residual bilateral pleural effusions. No new findings. Electronically Signed   By: Richardean Sale M.D.   On: 08/04/2021 14:14    Procedures Procedures   Medications Ordered in ED Medications  furosemide (LASIX) injection 80 mg (has no administration in time range)    ED Course  I have reviewed the triage vital signs and the nursing notes.  Pertinent labs & imaging results that were available during my care of the patient were reviewed by me and considered in my medical decision making (see chart for details).    MDM Rules/Calculators/A&P                           Patient with known history of CHF who was recently admitted for CHF at that time she was hypoxic.  She had an elevated BNP and increased markings on her chest x-Teonna Coonan. States that she does not have all of her medications she only has 2 that were prescribed at the discharging doctor.  I have represcribed her Eliquis and her 6. Today her BNP has decreased from 1291-942 , chest x-Bennet Kujawa with interval improved interstitial prominence likely due to resolving edema. Patient is given additional dose of Lasix here in the ED. Oxygen saturations are 98% Blood pressure 167/70 and heart rate is normal. Plan COVID test and discharged home. Patient advised of all above testing results and to follow-up on MyChart for COVID test results.  She is a  candidate for treatment if her COVID test comes back positive.  Otherwise, she is to follow-up with her cardiologist as scheduled. Final Clinical Impression(s) / ED Diagnoses Final diagnoses:  Weakness  Dyspnea, unspecified type    Rx / DC Orders ED Discharge Orders     None        Pattricia Boss, MD 08/04/21 Karl Bales    Pattricia Boss, MD 08/04/21 2005

## 2021-08-05 ENCOUNTER — Encounter (HOSPITAL_COMMUNITY): Payer: Medicare HMO

## 2021-08-18 ENCOUNTER — Ambulatory Visit (INDEPENDENT_AMBULATORY_CARE_PROVIDER_SITE_OTHER): Payer: Medicare HMO | Admitting: Internal Medicine

## 2021-08-18 ENCOUNTER — Other Ambulatory Visit: Payer: Self-pay

## 2021-08-18 ENCOUNTER — Encounter: Payer: Self-pay | Admitting: Internal Medicine

## 2021-08-18 ENCOUNTER — Telehealth: Payer: Self-pay

## 2021-08-18 VITALS — BP 156/82 | HR 68 | Ht 66.0 in | Wt 176.0 lb

## 2021-08-18 DIAGNOSIS — I495 Sick sinus syndrome: Secondary | ICD-10-CM | POA: Diagnosis not present

## 2021-08-18 DIAGNOSIS — I480 Paroxysmal atrial fibrillation: Secondary | ICD-10-CM | POA: Diagnosis not present

## 2021-08-18 NOTE — Telephone Encounter (Signed)
Returned patient's call.  Left a VM message for her to call back so I can provide the information she requested.

## 2021-08-18 NOTE — Progress Notes (Signed)
HPI Tracey Bryant is a 70 y.o. female who is referred by Dr. 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 Westley Gambles. She is limited by weakness and arthritis. She gets around with a walker. She has RA.  No Known Allergies   Current Outpatient Medications  Medication Sig Dispense Refill   acetaminophen (TYLENOL) 500 MG tablet Take 1 tablet (500 mg total) by mouth every 8 (eight) hours as needed for moderate pain. 20 tablet 0   apixaban (ELIQUIS) 5 MG TABS tablet Take 1 tablet (5 mg total) by mouth 2 (two) times daily. 60 tablet 0   carvedilol (COREG) 6.25 MG tablet Take 1 tablet (6.25 mg total) by mouth 2 (two) times daily with a meal. 60 tablet 0   furosemide (LASIX) 20 MG tablet Take 1 tablet (20 mg total) by mouth daily. 30 tablet 0   insulin aspart (NOVOLOG) 100 UNIT/ML injection Substitute to any brand approved.Before each meal 3 times a day, 140-199 - 2 units, 200-250 - 4 units, 251-299 - 6 units,  300-349 - 8 units,  350 or above 10 units. Dispense syringes and needles as needed, Ok to switch to PEN if approved. DX DM2, Code E11.65  0   insulin glargine (LANTUS) 100 UNIT/ML injection Inject 0.25 mLs (25 Units total) into the skin at bedtime. 10 mL 0   isosorbide-hydrALAZINE (BIDIL) 20-37.5 MG tablet Take 1 tablet by mouth 3 (three) times daily. 90 tablet 0   ezetimibe (ZETIA) 10 MG tablet Take 10 mg by mouth daily. (Patient not taking: Reported on 08/18/2021)     senna-docusate (SENOKOT-S) 8.6-50 MG tablet Take 1 tablet by mouth at bedtime as needed for mild constipation. (Patient not taking: Reported on 08/18/2021)     tamsulosin (FLOMAX) 0.4 MG CAPS capsule Take 0.4 mg by mouth every morning. (Patient not taking: Reported on 08/18/2021)     No current  facility-administered medications for this visit.     Past Medical History:  Diagnosis Date   Arthritis    Cardiomyopathy ([place])    Diabetes mellitus without complication ([place])    DVT (deep venous thrombosis) ([place]) 07/26/2021   Hypertension    Paroxysmal atrial fibrillation ([place]) 07/26/2021   Stroke ([place])     ROS:   All systems reviewed and negative except as noted in the HPI.   Past Surgical History:  Procedure Laterality Date   breast tumor     IR CHOLANGIOGRAM EXISTING TUBE  05/13/2021   PACEMAKER IMPLANT     TONSILLECTOMY       Family History  Problem Relation Age of Onset   Cancer Mother    Diabetes Mother    Heart disease Father      Social History   Socioeconomic History   Marital status: Divorced    Spouse name: Not on file   Number of children: Not on file   Years of education: Not on file   Highest education level: Not on file  Occupational History   Not on file  Tobacco Use   Smoking status: Never   Smokeless tobacco: Never  Vaping Use   Vaping Use: Never used  Substance and Sexual Activity   Alcohol use: No   Drug use: Never   Sexual activity: Not on file  Other Topics Concern  Not on file  Social History Narrative   Not on file   Social Determinants of Health   Financial Resource Strain: Not on file  Food Insecurity: Not on file  Transportation Needs: Not on file  Physical Activity: Not on file  Stress: Not on file  Social Connections: Not on file  Intimate Partner Violence: Not on file     BP (!) 156/82   Pulse 68   Ht 5\' 6"  (1.676 m)   Wt 176 lb (79.8 kg)   SpO2 97%   BMI 28.41 kg/m   Physical Exam:  Well appearing NAD HEENT: Unremarkable Neck:  No JVD, no thyromegally Lymphatics:  No adenopathy Back:  No CVA tenderness Lungs:  Clear HEART:  Regular rate rhythm, no murmurs, no rubs, no clicks Abd:  soft, positive bowel sounds, no organomegally, no rebound, no guarding Ext:  2 plus pulses, no edema, no cyanosis, no  clubbing Skin:  No rashes no nodules Neuro:  CN II through XII intact, motor grossly intact  DEVICE  Normal device function.  See PaceArt for details.   Assess/Plan:  1. Acute on chronic systolic and diastolic heart failure Cardiomyopathy She appears to be euvolemic and is tolerating guideline directed medical therapy. She will continue her current meds.   2. Paroxysmal atrial fibrillation History of Medtronic PPM implant 05/19/20 -Currently remains in sinus rhythm -Continue anticoagulation with Eliquis - She has not had atrial fib since her last check.   3. Hypertension -Blood pressure is elevated, continue bidil , continue carvedilol and Lasix. If her pressure is up when we see her again, then increase coreg.    Carleene Overlie Shariece Viveiros,MD

## 2021-08-18 NOTE — Patient Instructions (Signed)
Medication Instructions:  Your physician recommends that you continue on your current medications as directed. Please refer to the Current Medication list given to you today.  *If you need a refill on your cardiac medications before your next appointment, please call your pharmacy*   Lab Work: NONE   If you have labs (blood work) drawn today and your tests are completely normal, you will receive your results only by: . MyChart Message (if you have MyChart) OR . A paper copy in the mail If you have any lab test that is abnormal or we need to change your treatment, we will call you to review the results.   Testing/Procedures: NONE    Follow-Up: At [facility], you and your health needs are our priority.  As part of our continuing mission to provide you with exceptional heart care, we have created designated Provider Care Teams.  These Care Teams include your primary Cardiologist (physician) and Advanced Practice Providers (APPs -  Physician Assistants and Nurse Practitioners) who all work together to provide you with the care you need, when you need it.  We recommend signing up for the patient portal called "MyChart".  Sign up information is provided on this After Visit Summary.  MyChart is used to connect with patients for Virtual Visits (Telemedicine).  Patients are able to view lab/test results, encounter notes, upcoming appointments, etc.  Non-urgent messages can be sent to your provider as well.   To learn more about what you can do with MyChart, go to [email or web address].    Your next appointment:   1 year(s)  The format for your next appointment:   In Person  Provider:   Gregg Taylor, MD   Other Instructions Thank you for choosing [facility]!    

## 2021-08-18 NOTE — Telephone Encounter (Signed)
Patient seen in Dr. Tanna Furry clinic today as a new patient. Needs remote monitoring but would like to know cost with her insurance prior to enrolling.  Attempted to send to [facility] Billing pool, will not allow me to.   Are you able to assist her or direct me to who can assist?    CMA pool ~ If patient agreeable, please schedule remotes then, if not she will need 6 month in-clinic check in [place].

## 2021-08-19 ENCOUNTER — Ambulatory Visit: Payer: Medicare HMO | Admitting: Student

## 2021-08-27 ENCOUNTER — Other Ambulatory Visit (HOSPITAL_COMMUNITY): Payer: Self-pay | Admitting: Family Medicine

## 2021-08-27 DIAGNOSIS — K810 Acute cholecystitis: Secondary | ICD-10-CM

## 2021-09-01 ENCOUNTER — Other Ambulatory Visit: Payer: Self-pay | Admitting: Interventional Radiology

## 2021-09-01 DIAGNOSIS — K810 Acute cholecystitis: Secondary | ICD-10-CM

## 2021-09-02 ENCOUNTER — Telehealth: Payer: Medicare HMO

## 2021-09-04 ENCOUNTER — Encounter: Payer: Self-pay | Admitting: *Deleted

## 2021-09-04 ENCOUNTER — Other Ambulatory Visit: Payer: Self-pay

## 2021-09-04 ENCOUNTER — Ambulatory Visit
Admission: RE | Admit: 2021-09-04 | Discharge: 2021-09-04 | Disposition: A | Discharge status: Home / Self Care | Payer: Medicare HMO | Source: Ambulatory Visit | Attending: Interventional Radiology | Admitting: Interventional Radiology

## 2021-09-04 DIAGNOSIS — K810 Acute cholecystitis: Secondary | ICD-10-CM

## 2021-09-04 HISTORY — PX: IR RADIOLOGIST EVAL & MGMT: IMG5224

## 2021-09-04 NOTE — Consult Note (Signed)
Chief Complaint: Patient was seen in consultation today for cholelithiasis with indwelling cholecystostomy tube.  She presents today via virtual telephone visit.  History of Present Illness: Tracey Bryant is a 70 y.o. female with history of acute calculous cholecystitis status post CT guided percutaneous cholecystostomy tube placement on 03/19/21.  She underwent check and exchange on 05/13/21 and presented on 07/27/21 with dislodged tube at which time the tube was replaced.   Most recent cholecystogram demonstrates similar appearance of an approximately 2 cm gallstone in the infundibulum.    She is overall doing well, tried of having the drain in place.  She complains of pain at the skin entry site which has been worse over the past month.  No erythema or leakage.  There has been consistent brown, bilious drainage without evidence of hemobilia.  Recently the tube clogged but primary MD flushed and it has functioned well.      She is highly interested in the least invasive way possible to manage her gallstone without having a prolonged recovery.  She is adamant about avoiding surgery.  She is currently on Eliquis for atrial fibrillation.    Past Medical History:  Diagnosis Date   Arthritis    Cardiomyopathy ([place])    Diabetes mellitus without complication ([place])    DVT (deep venous thrombosis) ([place]) 07/26/2021   Hypertension    Paroxysmal atrial fibrillation ([place]) 07/26/2021   Stroke [facility])     Past Surgical History:  Procedure Laterality Date   breast tumor     IR CHOLANGIOGRAM EXISTING TUBE  05/13/2021   IR EXCHANGE BILIARY DRAIN  07/27/2021   PACEMAKER IMPLANT     TONSILLECTOMY      Allergies: Patient has no known allergies.  Medications: Prior to Admission medications   Medication Sig Start Date End Date Taking? Authorizing Provider  acetaminophen (TYLENOL) 500 MG tablet Take 1 tablet (500 mg total) by mouth every 8 (eight) hours as needed for moderate pain. 04/08/21 04/08/22   [name], MD  apixaban (ELIQUIS) 5 MG TABS tablet Take 1 tablet (5 mg total) by mouth 2 (two) times daily. 08/04/21   Pattricia Boss, MD  carvedilol (COREG) 6.25 MG tablet Take 1 tablet (6.25 mg total) by mouth 2 (two) times daily with a meal. 08/01/21   [name], MD  ezetimibe (ZETIA) 10 MG tablet Take 10 mg by mouth daily. Patient not taking: Reported on 08/18/2021    [provider]  furosemide (LASIX) 20 MG tablet Take 1 tablet (20 mg total) by mouth daily. 08/04/21   Pattricia Boss, MD  insulin aspart (NOVOLOG) 100 UNIT/ML injection Substitute to any brand approved.Before each meal 3 times a day, 140-199 - 2 units, 200-250 - 4 units, 251-299 - 6 units,  300-349 - 8 units,  350 or above 10 units. Dispense syringes and needles as needed, Ok to switch to PEN if approved. DX DM2, Code E11.65 04/08/21   [name], MD  insulin glargine (LANTUS) 100 UNIT/ML injection Inject 0.25 mLs (25 Units total) into the skin at bedtime. 04/08/21   [name], MD  isosorbide-hydrALAZINE (BIDIL) 20-37.5 MG tablet Take 1 tablet by mouth 3 (three) times daily. 08/01/21   [name], MD  senna-docusate (SENOKOT-S) 8.6-50 MG tablet Take 1 tablet by mouth at bedtime as needed for mild constipation. Patient not taking: Reported on 08/18/2021 04/08/21   [name], MD  tamsulosin (FLOMAX) 0.4 MG CAPS capsule Take 0.4 mg by mouth every morning.  Patient not taking: Reported on 08/18/2021 04/20/21   [provider]     Family History  Problem Relation Age of Onset   Cancer Mother    Diabetes Mother    Heart disease Father     Social History   Socioeconomic History   Marital status: Divorced    Spouse name: Not on file   Number of children: Not on file   Years of education: Not on file   Highest education level: Not on file  Occupational History   Not on file  Tobacco Use   Smoking status: Never   Smokeless tobacco: Never  Vaping Use   Vaping Use: Never  used  Substance and Sexual Activity   Alcohol use: No   Drug use: Never   Sexual activity: Not on file  Other Topics Concern   Not on file  Social History Narrative   Not on file   Social Determinants of Health   Financial Resource Strain: Not on file  Food Insecurity: Not on file  Transportation Needs: Not on file  Physical Activity: Not on file  Stress: Not on file  Social Connections: Not on file    Review of Systems: A 12 point ROS discussed and pertinent positives are indicated in the HPI above.  All other systems are negative.  Vital Signs: There were no vitals taken for this visit.  No physical examination in lieu of telephone visit.  Imaging: CT AP 03/17/21   Cholecystogram 05/13/21   Labs:  CBC: Recent Labs    07/26/21 1601 07/27/21 0354 07/27/21 1418 08/04/21 1350  WBC 11.3* 10.6* 10.3 9.7  HGB 10.1* 10.1* 10.3* 9.7*  HCT 32.4* 32.2* 32.4* 31.7*  PLT 452* 475* 322 387    COAGS: Recent Labs    03/18/21 0630 03/19/21 0504 03/20/21 0147 03/21/21 0115  INR 1.6* 1.4* 1.6* 1.5*    BMP: Recent Labs    07/30/21 0147 07/31/21 0024 08/01/21 0027 08/04/21 1350  NA 131* 132* 131* 136  K 4.4 4.5 4.7 4.4  CL 100 101 102 107  CO2 23 23 20* 20*  GLUCOSE 188* 145* 131* 53*  BUN 28* 32* 35* 26*  CALCIUM 8.3* 8.4* 8.3* 8.5*  CREATININE 2.48* 2.58* 2.43* 1.86*  GFRNONAA 21* 20* 21* 29*    LIVER FUNCTION TESTS: Recent Labs    04/06/21 0829 04/07/21 0110 07/26/21 1601 08/04/21 1350  BILITOT 0.6 0.8 0.9 0.7  AST 19 23 17 19   ALT 21 20 10 13   ALKPHOS 48 52 54 55  PROT 6.5 6.6 6.5 7.0  ALBUMIN 2.3* 2.5* 2.9* 2.9*    TUMOR MARKERS: No results for input(s): AFPTM, CEA, CA199, CHROMGRNA in the last 8760 hours.  Assessment and Plan: 70 year old female with history of acute calculous cholecystitis status post percutaneous cholecystostomy tube placement in March 2022.  She is hopeful to avoid surgery and have her tube removed.  We discussed  risks, benefits, and alternatives to percutaneous gallstone retrieval which could include lithotripsy, multiple sessions, and multiple tube checks/changes after initial procedure.  She is interested in proceeding.  I will reach out to her Cardiologist, Dr. Cristopher Peru, to ask permission to hold her Eliquis for 2 days prior to procedure and resume the day after.  Plan for Spyglass-assisted percutaneous gallstone fragmentation and retrieval at [facility] with moderate sedation, cefoxitin antibiotic prophylaxis, and same day discharge home.    She expresses wishes to have all coordination and phone calls to go through her  son, Maud Deed - [phone].   Electronically Signed: Suzette Battiest, MD 09/04/2021, 9:48 AM   I spent a total of  40 Minutes  in face to face in clinical consultation, greater than 50% of which was counseling/coordinating care for cholelithiasis.

## 2021-09-15 ENCOUNTER — Telehealth (HOSPITAL_COMMUNITY): Payer: Self-pay

## 2021-09-15 NOTE — Telephone Encounter (Signed)
Called to schedule spyglass procedure, no answer, left vm. AW

## 2021-09-17 ENCOUNTER — Other Ambulatory Visit (HOSPITAL_COMMUNITY): Payer: Self-pay | Admitting: Interventional Radiology

## 2021-09-17 ENCOUNTER — Telehealth (HOSPITAL_COMMUNITY): Payer: Self-pay

## 2021-09-17 DIAGNOSIS — K810 Acute cholecystitis: Secondary | ICD-10-CM

## 2021-09-17 NOTE — Telephone Encounter (Signed)
-----   Message from Suzette Battiest, MD sent at 09/14/2021  6:30 AM EDT ----- Regarding: FW: Eliquis hold  ----- Message ----- From: Evans Lance, MD Sent: 09/13/2021   8:54 PM EDT To: Suzette Battiest, MD Subject: RE: Eliquis hold                               Ok to hold Eliquis as described. Thx.  ----- Message ----- From: Suzette Battiest, MD Sent: 09/04/2021  10:26 AM EDT To: Evans Lance, MD Subject: Eliquis hold                                   Dr. Lovena Le,  I'm planning to perform percutaneous gallstone retrieval on Ms. Steinert in hopes of getting rid of her cholecystostomy tube.  I'm writing to ask your permission to hold Eliquis 2 days prior to the procedure and resume the following day.  Thanks so much,  Theme park manager Interventional Radiology

## 2021-09-24 ENCOUNTER — Other Ambulatory Visit: Payer: Self-pay | Admitting: Student

## 2021-09-25 ENCOUNTER — Ambulatory Visit (HOSPITAL_COMMUNITY)
Admission: RE | Admit: 2021-09-25 | Discharge: 2021-09-25 | Disposition: A | Discharge status: Home / Self Care | Payer: Medicare HMO | Source: Ambulatory Visit | Attending: Interventional Radiology | Admitting: Interventional Radiology

## 2021-09-25 ENCOUNTER — Other Ambulatory Visit (HOSPITAL_COMMUNITY): Payer: Self-pay | Admitting: Interventional Radiology

## 2021-09-25 ENCOUNTER — Other Ambulatory Visit: Payer: Self-pay

## 2021-09-25 VITALS — BP 171/77 | HR 72 | Temp 98.1°F | Resp 14 | Ht 66.0 in | Wt 162.0 lb

## 2021-09-25 DIAGNOSIS — E119 Type 2 diabetes mellitus without complications: Secondary | ICD-10-CM | POA: Insufficient documentation

## 2021-09-25 DIAGNOSIS — I10 Essential (primary) hypertension: Secondary | ICD-10-CM | POA: Insufficient documentation

## 2021-09-25 DIAGNOSIS — I480 Paroxysmal atrial fibrillation: Secondary | ICD-10-CM | POA: Diagnosis not present

## 2021-09-25 DIAGNOSIS — K810 Acute cholecystitis: Secondary | ICD-10-CM

## 2021-09-25 DIAGNOSIS — Z79899 Other long term (current) drug therapy: Secondary | ICD-10-CM | POA: Diagnosis not present

## 2021-09-25 DIAGNOSIS — Z794 Long term (current) use of insulin: Secondary | ICD-10-CM | POA: Insufficient documentation

## 2021-09-25 DIAGNOSIS — Z7901 Long term (current) use of anticoagulants: Secondary | ICD-10-CM | POA: Diagnosis not present

## 2021-09-25 DIAGNOSIS — E1165 Type 2 diabetes mellitus with hyperglycemia: Secondary | ICD-10-CM

## 2021-09-25 HISTORY — PX: IR CATHETER TUBE CHANGE: IMG717

## 2021-09-25 HISTORY — PX: IR REMOVAL OF CALCULI/DEBRIS BILIARY DUCT/GB: IMG6054

## 2021-09-25 LAB — GLUCOSE, CAPILLARY: Glucose-Capillary: 101 mg/dL — ABNORMAL HIGH (ref 70–99)

## 2021-09-25 IMAGING — XA IR REMOVE CALCULI/DEBRIS BILIARY DUCT/GB
9 series · 13 of 18 positions shown · non-contrast
Comparison: none

INDICATION: 69-year-old female with history of acute calculus cholecystitis
status post percutaneous cholecystostomy tube placement on
[DATE]. The patient has been deemed a poor surgical candidate,
therefore presents for percutaneous gallstone retrieval in hopes of
eventually becoming cholecystostomy tube free.

[Series 1: fl (-) angio · 1 of 1 slices shown (1 of 3)]
[im 1/1]
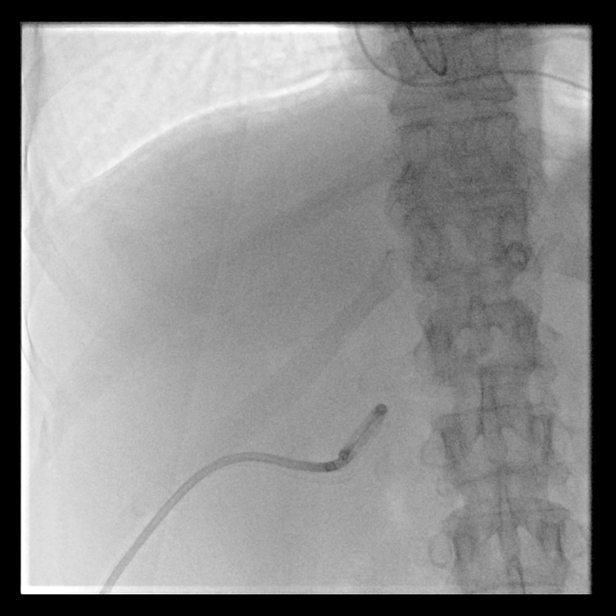

[Series 2: single · 1 of 2 slices shown (1 of 6)]
[im 2/2]
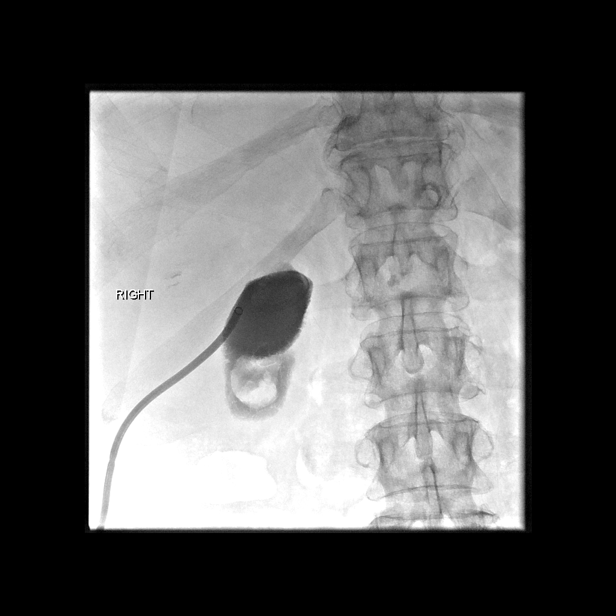

[Series 3: single · 2 of 2 slices shown (2 of 6)]
[im 1/2]
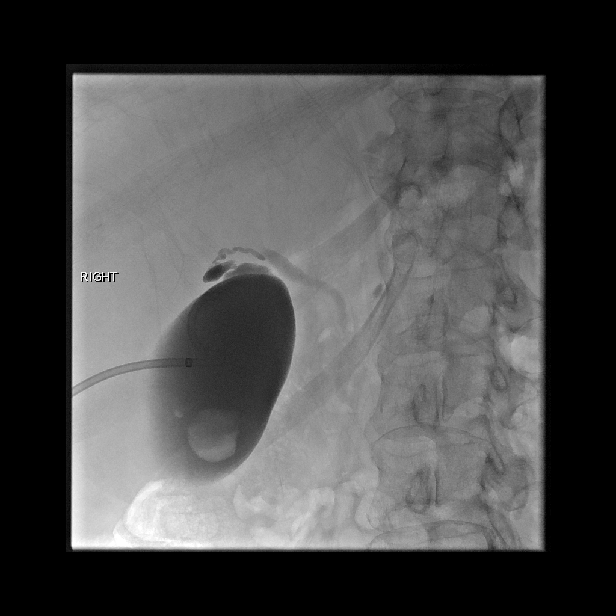
[im 2/2]
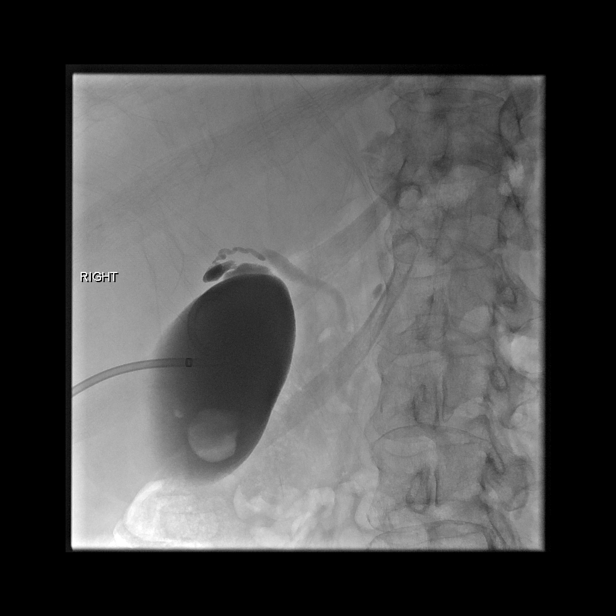

[Series 4: single · 1 of 2 slices shown (3 of 6)]
[im 2/2]
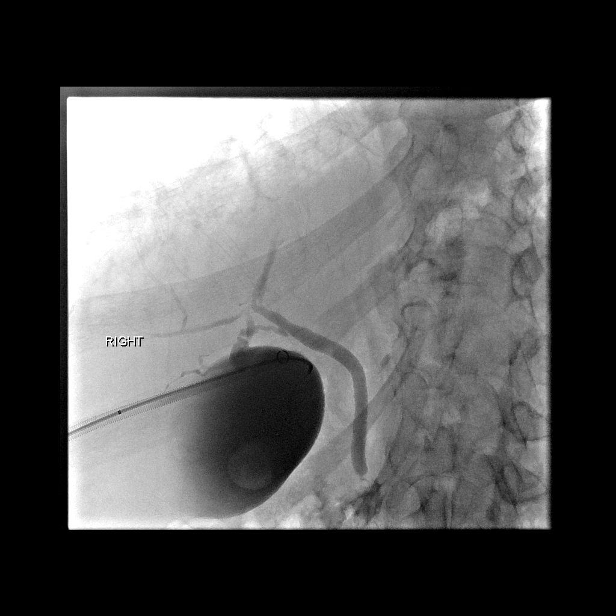

[Series 5: fl (-) angio · 1 of 1 slices shown (2 of 3)]
[im 1/1]
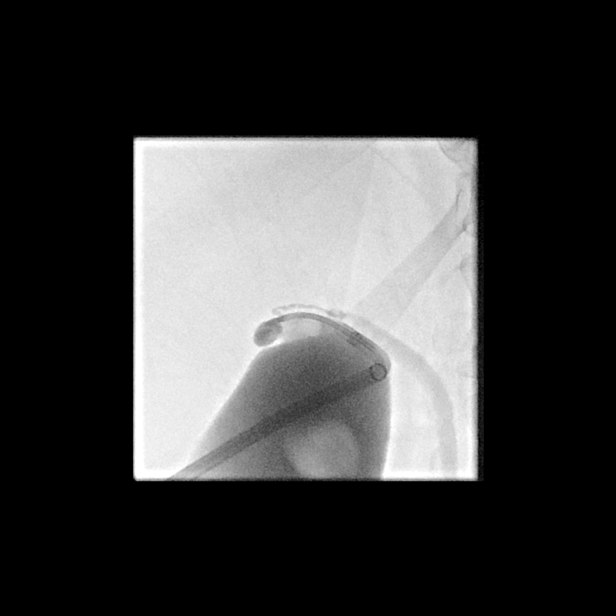

[Series 6: single · 1 of 2 slices shown (4 of 6)]
[im 2/2]
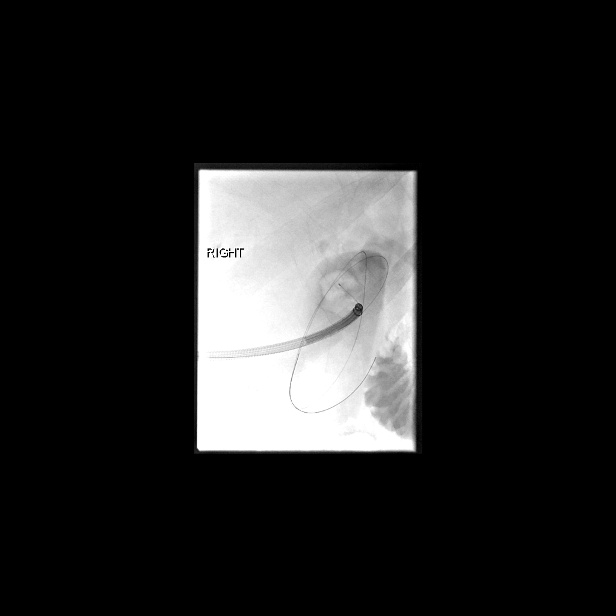

[Series 7: fl (-) angio · 3 of 51 frames shown (3 of 3)]
[frame 1/51]
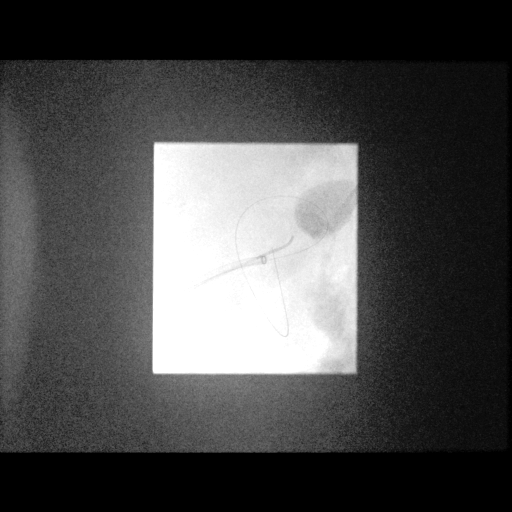
[frame 8/51]
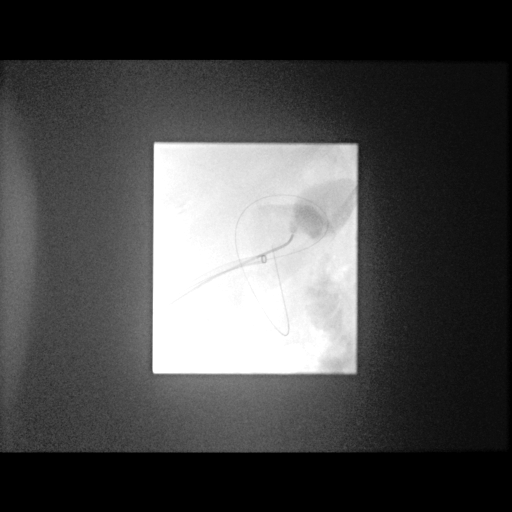
[frame 44/51]
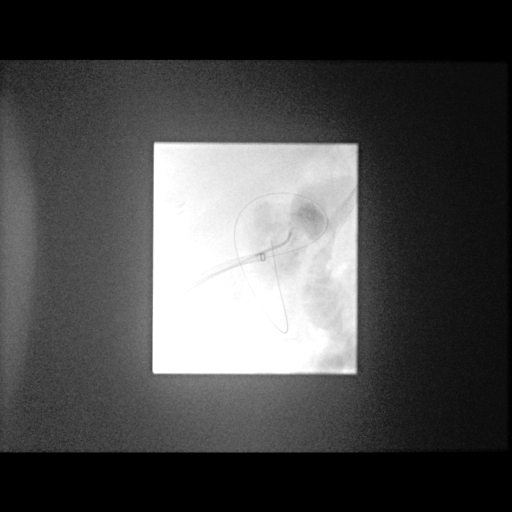

[Series 8: single · 2 of 2 slices shown (5 of 6)]
[im 1/2]
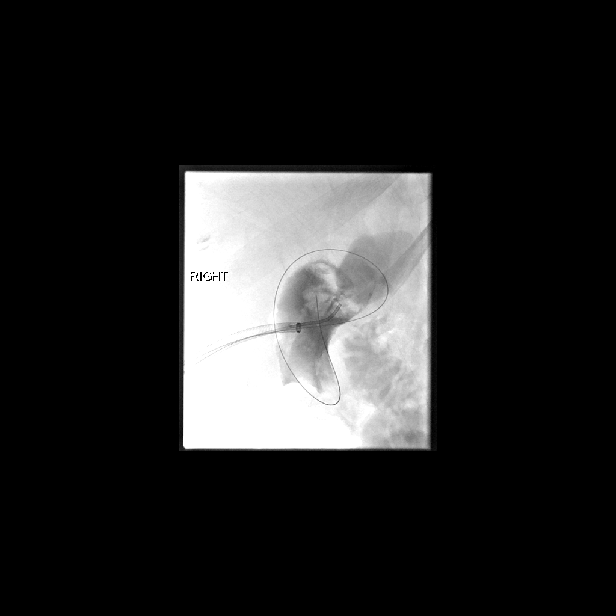
[im 2/2]
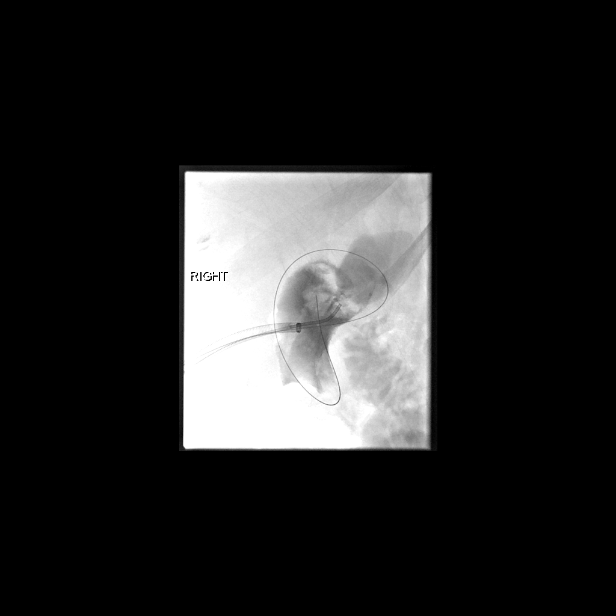

[Series 9: single · 1 of 2 slices shown (6 of 6)]
[im 2/2]
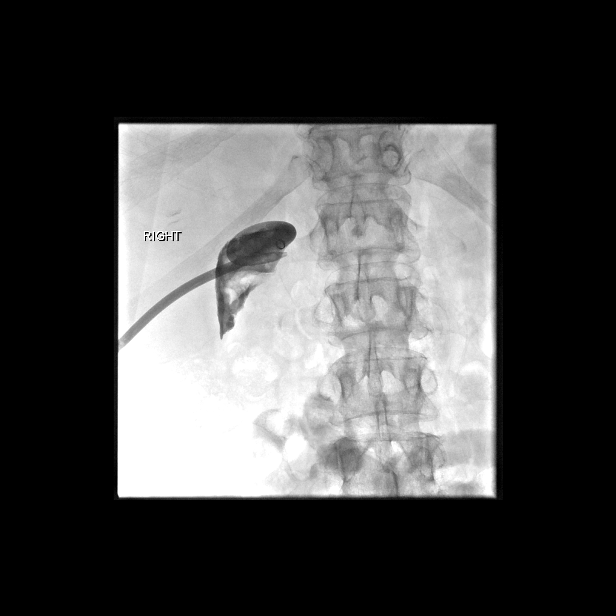

[13 of 18 positions shown; findings below may reference images not displayed]

EXAM:
1. Cholecystogram.
2. Spyglass assisted percutaneous gallstone lithotripsy.
3. Spyglass assisted percutaneous gallstone retrieval.
4. Cholecystostomy tube exchange.

MEDICATIONS:
2 g cefoxitin, intravenous. ; The antibiotic was administered within
an appropriate time frame prior to the initiation of the procedure.
10 mg hydralazine, intravenous.

ANESTHESIA/SEDATION:
Moderate (conscious) sedation was employed during this procedure. A
total of Versed 5.5 mg and Fentanyl 275 mcg was administered
intravenously.

Moderate Sedation Time: 143 minutes. The patient's level of
consciousness and vital signs were monitored continuously by
radiology nursing throughout the procedure under my direct
supervision.

FLUOROSCOPY TIME:  Fluoroscopy Time: 30 minutes 54 seconds (462
mGy).

COMPLICATIONS:
None immediate.

PROCEDURE:
Informed written consent was obtained from the patient after a
thorough discussion of the procedural risks, benefits and
alternatives. All questions were addressed. Maximal Sterile Barrier
Technique was utilized including caps, mask, sterile gowns, sterile
gloves, sterile drape, hand hygiene and skin antiseptic. A timeout
was performed prior to the initiation of the procedure.

The indwelling right percutaneous nephrostomy tube was prepped and
draped in standard fashion. Preprocedure scout image demonstrates
the tube in appropriate position in the right upper quadrant. Gentle
hand injection of contrast demonstrates a large, proximally 2 cm
gallstone in the gallbladder fundus. The cystic duct and common bile
ducts are patent. Subdermal Local anesthesia was provided near the
tube entry site with 1% lidocaine. The indwelling drain was cut to
release the pigtail string and exchanged over an Amplatz wire for a
12 French, 45 cm braided angled tip sheath. Through the sheath, a
Navicross catheter and Glidewire were placed in attempts to
cannulate the cystic duct. This, along with coaxial insertion of an
8 French Tourguide steerable sheath and Kumpe the catheter were
unsuccessful cannulating the cystic duct.

The spyglass discover scope was then inserted and the large
gallstone was visualized. Lithotripsy with direct visualization was
successful in fragmenting the stone. Multiple sweeps with the 12 mm
retrieval basket were performed which yielded a moderate amount of
fragments. Copious and continuous irrigation was performed,
partially through and adjacently placed Kumpe the catheter in the
gallbladder lumen with drainage via the indwelling sheath.
Additional lithotripsy of a few smaller fragments was also
performed. Approximately 40-50% of the indwelling stone burden was
removed. Multiple remaining stone fragments were too large to be
grasped with the 12 mm basket.

Over an Amplatz wire, the indwelling sheath was removed and
exchanged for a 14 French multipurpose drainage catheter with the
pigtail portion coiled in the gallbladder infundibulum. The catheter
was sutured at the skin entry site with a 0 silk suture. A sterile
bandage was applied. The patient tolerated procedure well without
complication.

The procedure was performed in concert with my associated, Dr.
SMOOT.
IMPRESSION: 1. Technically successful percutaneous lithotripsy resulting in
fragmentation of single 2 cm gallstone. Approximately 40-50% of
fragments were successfully removed percutaneously.
2. Replacement and upsize of cholecystostomy tube 214 French via the
established track. The tube was placed to bag drainage.

PLAN:
Return in 3-4 weeks for repeat Spyglass assisted percutaneous
gallstone retrieval. Keep to bag drainage until this time.

## 2021-09-25 MED ORDER — SODIUM CHLORIDE 0.9 % IV SOLN: INTRAVENOUS | Status: DC

## 2021-09-25 MED ORDER — MIDAZOLAM HCL 2 MG/2ML IJ SOLN
INTRAMUSCULAR | Status: DC | PRN
  Administered 2021-09-25 (×8): .5 mg
  Administered 2021-09-25: 1 mg via INTRAVENOUS
  Administered 2021-09-25: .5 mg

## 2021-09-25 MED ORDER — LIDOCAINE HCL 1 % IJ SOLN
INTRAMUSCULAR | Status: AC
  Filled 2021-09-25: qty 20

## 2021-09-25 MED ORDER — FENTANYL CITRATE (PF) 100 MCG/2ML IJ SOLN
INTRAMUSCULAR | Status: DC | PRN
  Administered 2021-09-25 (×3): 25 ug
  Administered 2021-09-25: 50 ug via INTRAVENOUS
  Administered 2021-09-25 (×6): 25 ug

## 2021-09-25 MED ORDER — FENTANYL CITRATE (PF) 100 MCG/2ML IJ SOLN
INTRAMUSCULAR | Status: AC
  Filled 2021-09-25: qty 2

## 2021-09-25 MED ORDER — HYDRALAZINE HCL 20 MG/ML IJ SOLN
INTRAMUSCULAR | Status: DC | PRN
  Administered 2021-09-25: 10 mg via INTRAVENOUS

## 2021-09-25 MED ORDER — SODIUM CHLORIDE 0.9 % IV SOLN
2.0000 g | Freq: Once | INTRAVENOUS | Status: AC
Start: 2021-09-25 — End: 2021-09-25
  Administered 2021-09-25: 2 g via INTRAVENOUS
  Filled 2021-09-25: qty 2

## 2021-09-25 MED ORDER — MIDAZOLAM HCL 2 MG/2ML IJ SOLN
INTRAMUSCULAR | Status: AC
  Filled 2021-09-25: qty 2

## 2021-09-25 MED ORDER — SODIUM CHLORIDE 0.9% FLUSH: 5.0000 mL | Freq: Three times a day (TID) | INTRAVENOUS | Status: DC

## 2021-09-25 MED ORDER — IOHEXOL 350 MG/ML SOLN
100.0000 mL | Freq: Once | INTRAVENOUS | Status: AC | PRN
  Administered 2021-09-25: 65 mL

## 2021-09-25 MED ORDER — HYDRALAZINE HCL 20 MG/ML IJ SOLN
INTRAMUSCULAR | Status: AC
  Filled 2021-09-25: qty 1

## 2021-09-25 NOTE — Progress Notes (Signed)
Spoke with patient at the bedside. Given information on blood glucose monitoring. Patient states that she has a blood glucose meter and strips at home. She just needs to get help with how to use it. Patient states that her daughter lives next door to her and brings food to her from time to time. Her daughter will be having [employer] deliver one meal a day to her mother.   Patient has not been taking insulin for a long time. Recommend that she go back to her PCP as soon as possible to follow up on glucose control and medications that she might need. Suggested that she record her blood sugars at home and test at least twice a day, before breakfast and supper. If her blood sugars run greater than 200 mg/dl continually, she will need to call her PCP.   Spoke with son on the phone prior to my visit with patient. He was asking about information on home help or assisted living. Suggested to him to call the [employer] in [place] for further assistance.   Harvel Ricks RN BSN CDE Diabetes Coordinator Pager: [phone]  8am-5pm

## 2021-09-25 NOTE — Progress Notes (Signed)
Pt states that her son and daughter are afraid to give her insulin, so she has not been taking it. Pt also states she has not been checking her CBG's because she is not sure how to check them. Diabetes coordinator consult placed. Asked pt when was insulin first ordered states months ago. Diabetes [facility]oor. Informed.

## 2021-09-25 NOTE — Sedation Documentation (Signed)
Patient is resting comfortably. 

## 2021-09-25 NOTE — H&P (Signed)
Chief Complaint: Patient was seen in consultation today for cholelithiasis with indwelling cholecystostomy tube  Referring Physician(s): Suttle,Dylan J  Supervising Physician: Marliss Coots  Patient Status: [facility] - Out-pt  History of Present Illness: Tracey Bryant is a 70 y.o. female with a medical history significant for DM, DVT, HTN, paroxysmal atrial fibrillation (Eliquis) and acute calculous cholecystitis s/p percutaneous cholecystostomy tube in IR 03/19/21. Her most recent cholecystogram demonstrates similar appearance of an approximately 2 cm gallstone in the infundibulum. The patient met with Dr. Elby Showers 09/04/21 to discuss non-surgical treatment options for stone removal. After a thorough discussion of the risks, benefits and alternative treatment options, the patient was in agreement to proceed with the recommended spyglass-assisted percutaneous gallstone fragmentation and retrieval procedure.    Past Medical History:  Diagnosis Date   Arthritis    Cardiomyopathy (HCC)    Diabetes mellitus without complication (HCC)    DVT (deep venous thrombosis) (HCC) 07/26/2021   Hypertension    Paroxysmal atrial fibrillation (HCC) 07/26/2021   Stroke [facility])     Past Surgical History:  Procedure Laterality Date   breast tumor     IR CHOLANGIOGRAM EXISTING TUBE  05/13/2021   IR EXCHANGE BILIARY DRAIN  07/27/2021   IR RADIOLOGIST EVAL & MGMT  09/04/2021   PACEMAKER IMPLANT     TONSILLECTOMY      Allergies: Patient has no known allergies.  Medications: Prior to Admission medications   Medication Sig Start Date End Date Taking? Authorizing Provider  carvedilol (COREG) 6.25 MG tablet Take 1 tablet (6.25 mg total) by mouth 2 (two) times daily with a meal. 08/01/21  Yes Azucena Fallen, MD  furosemide (LASIX) 20 MG tablet Take 1 tablet (20 mg total) by mouth daily. 08/04/21  Yes Margarita Grizzle, MD  insulin aspart (NOVOLOG) 100 UNIT/ML injection Substitute to any brand approved.Before each  meal 3 times a day, 140-199 - 2 units, 200-250 - 4 units, 251-299 - 6 units,  300-349 - 8 units,  350 or above 10 units. Dispense syringes and needles as needed, Ok to switch to PEN if approved. DX DM2, Code E11.65 04/08/21  Yes Leroy Sea, MD  insulin glargine (LANTUS) 100 UNIT/ML injection Inject 0.25 mLs (25 Units total) into the skin at bedtime. 04/08/21  Yes Leroy Sea, MD  isosorbide-hydrALAZINE (BIDIL) 20-37.5 MG tablet Take 1 tablet by mouth 3 (three) times daily. 08/01/21  Yes Azucena Fallen, MD  senna-docusate (SENOKOT-S) 8.6-50 MG tablet Take 1 tablet by mouth at bedtime as needed for mild constipation. 04/08/21  Yes Leroy Sea, MD  tamsulosin (FLOMAX) 0.4 MG CAPS capsule Take 0.4 mg by mouth every morning. 04/20/21  Yes [provider]  acetaminophen (TYLENOL) 500 MG tablet Take 1 tablet (500 mg total) by mouth every 8 (eight) hours as needed for moderate pain. 04/08/21 04/08/22  Leroy Sea, MD  apixaban (ELIQUIS) 5 MG TABS tablet Take 1 tablet (5 mg total) by mouth 2 (two) times daily. 08/04/21   Margarita Grizzle, MD  ezetimibe (ZETIA) 10 MG tablet Take 10 mg by mouth daily. Patient not taking: Reported on 08/18/2021    [provider]     Family History  Problem Relation Age of Onset   Cancer Mother    Diabetes Mother    Heart disease Father     Social History   Socioeconomic History   Marital status: Divorced    Spouse name: Not on file   Number of children: Not on file  Years of education: Not on file   Highest education level: Not on file  Occupational History   Not on file  Tobacco Use   Smoking status: Never   Smokeless tobacco: Never  Vaping Use   Vaping Use: Never used  Substance and Sexual Activity   Alcohol use: No   Drug use: Never   Sexual activity: Not on file  Other Topics Concern   Not on file  Social History Narrative   Not on file   Social Determinants of Health   Financial Resource Strain: Not on file   Food Insecurity: Not on file  Transportation Needs: Not on file  Physical Activity: Not on file  Stress: Not on file  Social Connections: Not on file    Review of Systems: A 12 point ROS discussed and pertinent positives are indicated in the HPI above.  All other systems are negative.  Review of Systems  Constitutional:  Negative for appetite change and fatigue.  Respiratory:  Negative for cough and shortness of breath.   Cardiovascular:  Negative for chest pain and leg swelling.  Gastrointestinal:  Negative for diarrhea, nausea and vomiting.       Chole tube discomfort at the skin site.   Neurological:  Positive for dizziness. Negative for headaches.       Occasional dizziness   Vital Signs: BP (!) 182/76   Pulse 62   Temp 98.1 F (36.7 C) (Oral)   Resp 15   Ht $R'5\' 6"'QY$  (1.676 m)   Wt 162 lb (73.5 kg)   SpO2 99%   BMI 26.15 kg/m   Physical Exam Constitutional:      General: She is not in acute distress. HENT:     Mouth/Throat:     Mouth: Mucous membranes are moist.     Pharynx: Oropharynx is clear.  Cardiovascular:     Rate and Rhythm: Normal rate and regular rhythm.  Pulmonary:     Effort: Pulmonary effort is normal.     Breath sounds: Normal breath sounds.  Abdominal:     General: Bowel sounds are normal.     Palpations: Abdomen is soft.     Comments: RUQ tenderness at the drain site. Perc chole drain to gravity. Approximately 150 ml of orange fluid with sediment. Skin site is edematous with mild drainage and tenderness.   Musculoskeletal:     Right lower leg: Edema present.     Left lower leg: Edema present.  Skin:    General: Skin is warm and dry.     Comments: Scattered excoriation marks on arms, abdomen, legs. Patient complains of dry, itchy skin  Neurological:     Mental Status: She is alert and oriented to person, place, and time.    Imaging: IR Radiologist Eval & Mgmt  Result Date: 09/04/2021 Please refer to notes tab for details about interventional  procedure. (Op Note)   Labs:  CBC: Recent Labs    07/26/21 1601 07/27/21 0354 07/27/21 1418 08/04/21 1350  WBC 11.3* 10.6* 10.3 9.7  HGB 10.1* 10.1* 10.3* 9.7*  HCT 32.4* 32.2* 32.4* 31.7*  PLT 452* 475* 322 387    COAGS: Recent Labs    03/18/21 0630 03/19/21 0504 03/20/21 0147 03/21/21 0115  INR 1.6* 1.4* 1.6* 1.5*    BMP: Recent Labs    07/30/21 0147 07/31/21 0024 08/01/21 0027 08/04/21 1350  NA 131* 132* 131* 136  K 4.4 4.5 4.7 4.4  CL 100 101 102 107  CO2 23 23 20* 20*  GLUCOSE 188* 145* 131* 53*  BUN 28* 32* 35* 26*  CALCIUM 8.3* 8.4* 8.3* 8.5*  CREATININE 2.48* 2.58* 2.43* 1.86*  GFRNONAA 21* 20* 21* 29*    LIVER FUNCTION TESTS: Recent Labs    04/06/21 0829 04/07/21 0110 07/26/21 1601 08/04/21 1350  BILITOT 0.6 0.8 0.9 0.7  AST $Re'19 23 17 19  'ykC$ ALT $R'21 20 10 13  'Tw$ ALKPHOS 48 52 54 55  PROT 6.5 6.6 6.5 7.0  ALBUMIN 2.3* 2.5* 2.9* 2.9*    TUMOR MARKERS: No results for input(s): AFPTM, CEA, CA199, CHROMGRNA in the last 8760 hours.  Assessment and Plan:  Acute calculous cholecystitis s/p percutaneous cholecystostomy tube placement 03/19/21; persistent 2 cm gallstone in the infundibulum: Anhar D. Villalva, 70 year old female, presents today to the [facility] department for an image-guided spyglass-assisted percutaneous gallstone fragmentation and retrieval procedure. She has held her Eliquis for two days (clearance obtained from Dr. Cristopher Peru, cardiology).   Risks and benefits were discussed with the patient including, but not limited to, bleeding, infection, gallbladder perforation, bile leak, sepsis or even death.  All of the patient's questions were answered, patient is agreeable to proceed. She has been NPO.   Consent signed and in chart.     Thank you for this interesting consult.  I greatly enjoyed meeting Piya D Ives and look forward to participating in their care.  A copy of this report was sent to the requesting  provider on this date.  Electronically Signed: Soyla Dryer, AGACNP-BC [phone] 09/25/2021, 8:53 AM   I spent a total of  30 Minutes   in face to face in clinical consultation, greater than 50% of which was counseling/coordinating care for image-guided spyglass-assisted percutaneous gallstone fragmentation and retrieval procedure.

## 2021-09-25 NOTE — Procedures (Signed)
Interventional Radiology Procedure Note  Procedure:  1) Percutaneous gallstone lithotripsy and retrieval 2) Exchange of cholecystostomy drain  Findings: Please refer to procedural dictation for full description. Lithotripsy of 2 cm gallstone with retrieval of approximately 40-50% of stone burden.  Drain upsized to 14 Fr, placed to bag drainage.  Complications: None   Estimated Blood Loss: < 5 mL  Recommendations: Discharge home today. Keep drain to bag drainage.  Continue once a day flushing. Return in 3-4 weeks for repeat lithotripsy/retrieval session.   Ruthann Cancer, MD Pager: [phone]

## 2021-10-15 ENCOUNTER — Other Ambulatory Visit (HOSPITAL_COMMUNITY): Payer: Self-pay | Admitting: Interventional Radiology

## 2021-10-15 DIAGNOSIS — K810 Acute cholecystitis: Secondary | ICD-10-CM

## 2021-10-21 ENCOUNTER — Telehealth (HOSPITAL_COMMUNITY): Payer: Self-pay

## 2021-10-21 NOTE — Telephone Encounter (Signed)
Called pt and son to schedule spyglass, no answer, left vm. AW

## 2021-10-26 ENCOUNTER — Telehealth: Payer: Self-pay | Admitting: *Deleted

## 2021-10-26 NOTE — Telephone Encounter (Signed)
   Pre-operative Risk Assessment    Patient Name: Tracey Bryant  DOB: 20-Apr-1951 MRN: [id number]      Request for Surgical Clearance   Procedure:   REPEAT SPYGALSS ASSISTED PERCUTANEOUS GALLSTONE RETRIEVAL  Date of Surgery: Clearance TBD                                 Surgeon:  DR. Ruthann Cancer Surgeon's Group or Practice Name:  [place] Phone number:  [phone] Fax number:  [phone]   Type of Clearance Requested: - Medical  - Pharmacy:  Hold Apixaban (Eliquis) 48 HOURS PRIOR 48 HOURS PRIOR   Type of Anesthesia:   Not Indicated   Additional requests/questions:   Jiles Prows   10/26/2021, 4:17 PM

## 2021-10-27 ENCOUNTER — Other Ambulatory Visit (HOSPITAL_COMMUNITY): Payer: Medicare HMO

## 2021-10-27 ENCOUNTER — Inpatient Hospital Stay (HOSPITAL_COMMUNITY): Admission: RE | Admit: 2021-10-27 | Payer: Medicare HMO | Source: Ambulatory Visit

## 2021-10-28 NOTE — Telephone Encounter (Signed)
Patient with diagnosis of afib on Eliquis for anticoagulation.    Procedure: REPEAT SPYGALSS ASSISTED PERCUTANEOUS GALLSTONE RETRIEVAL   Date of procedure: TBD  CHA2DS2-VASc Score = 7  This indicates a 11.2% annual risk of stroke. The patient's score is based upon: CHF History: 1 HTN History: 1 Diabetes History: 1 Stroke History: 2 Vascular Disease History: 0 Age Score: 1 Gender Score: 1      CrCl 26.7 ml/min  ACC/AHA and Chest guidelines do not recommend bridging with DOAC's. With poor renal function I would advise against bridging.  Per office protocol, patient can hold Eliquis for 2 days prior to procedure.

## 2021-11-02 NOTE — Telephone Encounter (Signed)
   Name: Tracey Bryant  DOB: Sep 05, 1951  MRN: [id number]  Primary Cardiologist: Dr. Percival Spanish EP: Dr. Lovena Le  Chart reviewed as part of pre-operative protocol coverage. Because of [name]'s past medical history and time since last visit, she will require a follow-up visit in order to better assess preoperative cardiovascular risk.  Pre-op covering staff: - Please schedule appointment and call patient to inform them. If patient already had an upcoming appointment within acceptable timeframe, please add "pre-op clearance" to the appointment notes so provider is aware. - Please contact requesting surgeon's office via preferred method (i.e, phone, fax) to inform them of need for appointment prior to surgery.  If applicable, this message will also be routed to pharmacy pool and/or primary cardiologist for input on holding anticoagulant/antiplatelet agent as requested below so that this information is available to the clearing provider at time of patient's appointment.   [place], [place]  11/02/2021, 1:45 PM

## 2021-11-02 NOTE — Telephone Encounter (Signed)
I left a message for the pt to call back and schedule an appt for pre op clearance. I did leave on my message that even though the pt prefers to be seen in the [place] location, the schedules are full, however we can get her scheduled I believe in the [place] location. Left message to call back ASAP to make appt.

## 2021-11-03 ENCOUNTER — Other Ambulatory Visit: Payer: Self-pay | Admitting: Internal Medicine

## 2021-11-03 ENCOUNTER — Other Ambulatory Visit: Payer: Self-pay | Admitting: Radiology

## 2021-11-03 ENCOUNTER — Encounter: Payer: Self-pay | Admitting: Family Medicine

## 2021-11-03 ENCOUNTER — Ambulatory Visit: Payer: Medicare HMO | Admitting: Family Medicine

## 2021-11-03 VITALS — BP 120/64 | HR 63 | Ht 66.0 in | Wt 155.8 lb

## 2021-11-03 DIAGNOSIS — I5042 Chronic combined systolic (congestive) and diastolic (congestive) heart failure: Secondary | ICD-10-CM | POA: Diagnosis not present

## 2021-11-03 DIAGNOSIS — Z01818 Encounter for other preprocedural examination: Secondary | ICD-10-CM | POA: Diagnosis not present

## 2021-11-03 DIAGNOSIS — I495 Sick sinus syndrome: Secondary | ICD-10-CM | POA: Diagnosis not present

## 2021-11-03 DIAGNOSIS — I480 Paroxysmal atrial fibrillation: Secondary | ICD-10-CM | POA: Diagnosis not present

## 2021-11-03 NOTE — Telephone Encounter (Signed)
Pt is scheduled to see Katina Dung, NP, at the [facility] office, today, 11/03/2021 and clearance will be addressed. Pt states that she was already instructed to hold her Eliquis so placed a call to IR @ [facility], left a detailed message for [facility] about appointment and not to cancel pt's procedure. Will route clearance to the requesting surgeon's office.

## 2021-11-03 NOTE — Patient Instructions (Signed)

## 2021-11-03 NOTE — Progress Notes (Signed)
Cardiology Office Note  Date: 11/03/2021   ID: Tracey Bryant, DOB 08-12-1951, MRN [id number]  PCP:  Coolidge Breeze, FNP  Cardiologist:  None Electrophysiologist:  None   Chief Complaint: Preop clearance  History of Present Illness: Tracey Bryant is a 70 y.o. female with a history of sinus node dysfunction, paroxysmal atrial fib, DVT, HTN, PAF, CVA, DM2.  History of RA, chronic systolic and diastolic heart failure.  She last saw Dr. Lovena Le on 08/18/2021.  She had been referred to establish care.  History of type 2 diabetes, HTN, PAF on Eliquis, chronic systolic diastolic heart failure, multiple ischemic CVAs.  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 Westley Gambles.  She was limited by weakness and arthritis.  She was getting around with a walker.  She appeared to be euvolemic and was tolerating guideline directed medical therapy she was continuing current medications.  History of PPM Medtronic implant 05/19/2020.  Remained in sinus rhythm was continuing Eliquis.  No atrial fibrillation since last check.  Blood pressure was elevated and she was continuing BiDil, carvedilol and Lasix.  Dr. Lovena Le mentioned if blood pressure was up when seen again would increase Coreg.  Recent request for surgical clearance on 10/26/2021 for  image-guided spyglass-assisted percutaneous gallstone fragmentation and retrieval procedure by Vascular and Interventional radiology. She was to hold Eliquis for 48 hours prior.  Her CHA2DS2-VASc score was 7.  Pharmacist was consulted who mentioned she could hold Eliquis for 2 days prior to procedure. Pharmacy consult patient could hold Elquis for 2 days prior to procedure.   She is here for preop clearance to undergo image guided spyglass assisted percutaneous gallstone fragmentation and mid travel by vascular and interventional radiology.  Dr.Dylan Suttle at [facility] radiology  will be performing the procedure.  Fax number is [phone].  Denies any anginal or  exertional symptoms, palpitations or arrhythmias, orthostatic symptoms, CVA or TIA symptoms.  No DOE or SOB, PND, orthopnea.  No lower extremity edema, claudication-like symptoms, DVT or PE-like symptoms.  Her EKG today shows electronic atrial pacemaker rate of 64, possible anterior infarct age undetermined, T wave abnormality, consider lateral ischemia.  She states she is no longer on insulin anymore.  She is currently taking glipizide and metformin oral agents for her diabetes.  She has what appears to be a drainage bag on her abdomen today with some brownish-red liquid.  She states she has had this procedure but needs another procedure to remove the gallstones she has left obstructing the gallbladder.  Past Medical History:  Diagnosis Date   Arthritis    Cardiomyopathy ([place])    Diabetes mellitus without complication ([place])    DVT (deep venous thrombosis) ([place]) 07/26/2021   Hypertension    Paroxysmal atrial fibrillation ([place]) 07/26/2021   Stroke [facility])     Past Surgical History:  Procedure Laterality Date   breast tumor     IR CATHETER TUBE CHANGE  09/25/2021   IR CHOLANGIOGRAM EXISTING TUBE  05/13/2021   IR EXCHANGE BILIARY DRAIN  07/27/2021   IR RADIOLOGIST EVAL & MGMT  09/04/2021   IR REMOVAL OF CALCULI/DEBRIS BILIARY DUCT/GB  09/25/2021   PACEMAKER IMPLANT     TONSILLECTOMY      Current Outpatient Medications  Medication Sig Dispense Refill   acetaminophen (TYLENOL) 500 MG tablet Take 1 tablet (500 mg total) by mouth every 8 (eight) hours as needed for moderate pain. 20 tablet 0   apixaban (ELIQUIS) 5 MG TABS tablet Take 1 tablet (5 mg  total) by mouth 2 (two) times daily. 60 tablet 0   carvedilol (COREG) 6.25 MG tablet Take 1 tablet (6.25 mg total) by mouth 2 (two) times daily with a meal. 60 tablet 0   furosemide (LASIX) 20 MG tablet Take 1 tablet (20 mg total) by mouth daily. 30 tablet 0   insulin aspart (NOVOLOG) 100 UNIT/ML injection Substitute to any brand approved.Before each meal 3  times a day, 140-199 - 2 units, 200-250 - 4 units, 251-299 - 6 units,  300-349 - 8 units,  350 or above 10 units. Dispense syringes and needles as needed, Ok to switch to PEN if approved. DX DM2, Code E11.65  0   insulin glargine (LANTUS) 100 UNIT/ML injection Inject 0.25 mLs (25 Units total) into the skin at bedtime. 10 mL 0   isosorbide-hydrALAZINE (BIDIL) 20-37.5 MG tablet Take 1 tablet by mouth 3 (three) times daily. 90 tablet 0   ezetimibe (ZETIA) 10 MG tablet Take 10 mg by mouth daily. (Patient not taking: No sig reported)     No current facility-administered medications for this visit.   Allergies:  Patient has no known allergies.   Social History: The patient  reports that she has never smoked. She has never used smokeless tobacco. She reports that she does not drink alcohol and does not use drugs.   Family History: The patient's family history includes Cancer in her mother; Diabetes in her mother; Heart disease in her father.   ROS:  Please see the history of present illness. Otherwise, complete review of systems is positive for none.  All other systems are reviewed and negative.   Physical Exam: VS:  BP 120/64   Pulse 63   Ht 5\' 6"  (1.676 m)   Wt 155 lb 12.8 oz (70.7 kg)   SpO2 99%   BMI 25.15 kg/m , BMI Body mass index is 25.15 kg/m.  Wt Readings from Last 3 Encounters:  11/03/21 155 lb 12.8 oz (70.7 kg)  09/25/21 162 lb (73.5 kg)  08/18/21 176 lb (79.8 kg)    General: Patient appears comfortable at rest. Neck: Supple, no elevated JVP or carotid bruits, no thyromegaly. Lungs: Clear to auscultation, nonlabored breathing at rest. Cardiac: Regular rate and rhythm, no S3 or significant systolic murmur, no pericardial rub. Extremities: No pitting edema, distal pulses 2+. Skin: Warm and dry. Musculoskeletal: No kyphosis. Neuropsychiatric: Alert and oriented x3, affect grossly appropriate.  ECG: 11/03/2021 electronic atrial pacemaker rate of 64, possible anterior infarct,  age undetermined, T wave abnormality, consider lateral ischemia.  Recent Labwork: 07/27/2021: Magnesium 1.8 08/04/2021: ALT 13; AST 19; B Natriuretic Peptide 942.4; BUN 26; Creatinine, Ser 1.86; Hemoglobin 9.7; Platelets 387; Potassium 4.4; Sodium 136     Component Value Date/Time   CHOL 92 04/05/2021 0034   TRIG 82 04/05/2021 0034   HDL 37 (L) 04/05/2021 0034   CHOLHDL 2.5 04/05/2021 0034   VLDL 16 04/05/2021 0034   LDLCALC 39 04/05/2021 0034    Other Studies Reviewed Today:   Assessment and Plan:  1. Preoperative clearance   2. Sinus node dysfunction (HCC)   3. Paroxysmal atrial fibrillation ([place])   4. Chronic combined systolic (congestive) and diastolic (congestive) heart failure ([place])    1. Preoperative clearance Patient is pending image guided spyglass assisted percutaneous gallstone fragmentation and mid travel by vascular and interventional radiology.  Dr.Dylan Suttle at [facility] radiology  will be performing the procedure.  Fax number is [phone].  Her Duke activity status index is 10.7 giving her  a functional capacity of 4.06 METS.  Her revised cardiac index score is 3 giving her a perioperative risk of major cardiac event at 11%.  This will be a percutaneous procedure without general anesthetic.  Patient is cleared to undergo procedure from a cardiac standpoint.  2. Sinus node dysfunction (HCC) History of sinus node dysfunction with pacemaker in place.  EKG today shows electronic atrial pacemaker with a rate of 64, possible anterior infarct, age undetermined, T wave abnormality, consider lateral ischemia.  3. Paroxysmal atrial fibrillation (HCC) Denies any recent palpitations or arrhythmias or irregular heartbeats.  No evidence of atrial fibrillation underlying EKG today.  Continue carvedilol 6.25 mg p.o. twice daily.  Continue Eliquis 5 mg p.o. twice daily.  May stop Eliquis 48 hours prior to gallbladder procedure.  4. Chronic combined systolic (congestive) and  diastolic (congestive) heart failure (HCC) History of systolic and diastolic congestive heart failure.  Last echocardiogram on 03/18/2021 demonstrated EF of 25 to 30%.  Mild LVH, G2 DD, LA severely dilated, mild pulmonary hypertension with PASP at 36 mm.  Continue carvedilol 6.25 mg p.o. twice daily.  Continue Lasix 20 mg p.o. daily.  Continue BiDil 20/37.5 mg p.o. 3 times daily.  Medication Adjustments/Labs and Tests Ordered: Current medicines are reviewed at length with the patient today.  Concerns regarding medicines are outlined above.   Disposition: Follow-up with Dr. Harl Bowie or APP 6 months   Signed, Levell July, NP 11/03/2021 3:18 PM    [facility] [address], [place], [place] [address] Phone: [phone]; Fax: [phone]

## 2021-11-04 ENCOUNTER — Encounter (HOSPITAL_COMMUNITY): Payer: Self-pay

## 2021-11-04 ENCOUNTER — Ambulatory Visit (HOSPITAL_COMMUNITY)
Admission: RE | Admit: 2021-11-04 | Discharge: 2021-11-04 | Disposition: A | Discharge status: Home / Self Care | Payer: Medicare HMO | Source: Ambulatory Visit | Attending: Interventional Radiology | Admitting: Interventional Radiology

## 2021-11-04 ENCOUNTER — Other Ambulatory Visit: Payer: Self-pay

## 2021-11-04 ENCOUNTER — Other Ambulatory Visit: Payer: Self-pay | Admitting: Radiology

## 2021-11-04 ENCOUNTER — Other Ambulatory Visit (HOSPITAL_COMMUNITY): Payer: Self-pay | Admitting: Interventional Radiology

## 2021-11-04 VITALS — BP 131/55 | HR 59 | Temp 98.0°F | Resp 8 | Ht 66.0 in | Wt 155.0 lb

## 2021-11-04 DIAGNOSIS — I480 Paroxysmal atrial fibrillation: Secondary | ICD-10-CM | POA: Diagnosis not present

## 2021-11-04 DIAGNOSIS — K810 Acute cholecystitis: Secondary | ICD-10-CM

## 2021-11-04 DIAGNOSIS — Z4659 Encounter for fitting and adjustment of other gastrointestinal appliance and device: Secondary | ICD-10-CM | POA: Diagnosis present

## 2021-11-04 DIAGNOSIS — Z8673 Personal history of transient ischemic attack (TIA), and cerebral infarction without residual deficits: Secondary | ICD-10-CM | POA: Insufficient documentation

## 2021-11-04 DIAGNOSIS — I10 Essential (primary) hypertension: Secondary | ICD-10-CM | POA: Insufficient documentation

## 2021-11-04 DIAGNOSIS — E119 Type 2 diabetes mellitus without complications: Secondary | ICD-10-CM | POA: Insufficient documentation

## 2021-11-04 DIAGNOSIS — Z86718 Personal history of other venous thrombosis and embolism: Secondary | ICD-10-CM | POA: Diagnosis not present

## 2021-11-04 DIAGNOSIS — K8020 Calculus of gallbladder without cholecystitis without obstruction: Secondary | ICD-10-CM | POA: Insufficient documentation

## 2021-11-04 HISTORY — PX: IR REMOVAL OF CALCULI/DEBRIS BILIARY DUCT/GB: IMG6054

## 2021-11-04 HISTORY — PX: IR EXCHANGE BILIARY DRAIN: IMG6046

## 2021-11-04 LAB — CBC
HCT: 42.4 % (ref 36.0–46.0)
Hemoglobin: 13.7 g/dL (ref 12.0–15.0)
MCH: 29.1 pg (ref 26.0–34.0)
MCHC: 32.3 g/dL (ref 30.0–36.0)
MCV: 90.2 fL (ref 80.0–100.0)
Platelets: 342 10*3/uL (ref 150–400)
RBC: 4.7 MIL/uL (ref 3.87–5.11)
RDW: 14.6 % (ref 11.5–15.5)
WBC: 8.6 10*3/uL (ref 4.0–10.5)
nRBC: 0 % (ref 0.0–0.2)

## 2021-11-04 LAB — BASIC METABOLIC PANEL
Anion gap: 9 (ref 5–15)
BUN: 39 mg/dL — ABNORMAL HIGH (ref 8–23)
CO2: 21 mmol/L — ABNORMAL LOW (ref 22–32)
Calcium: 8.8 mg/dL — ABNORMAL LOW (ref 8.9–10.3)
Chloride: 105 mmol/L (ref 98–111)
Creatinine, Ser: 2.35 mg/dL — ABNORMAL HIGH (ref 0.44–1.00)
GFR, Estimated: 22 mL/min — ABNORMAL LOW (ref >60)
Glucose, Bld: 174 mg/dL — ABNORMAL HIGH (ref 70–99)
Potassium: 4.3 mmol/L (ref 3.5–5.1)
Sodium: 135 mmol/L (ref 135–145)

## 2021-11-04 LAB — GLUCOSE, CAPILLARY
Glucose-Capillary: 190 mg/dL — ABNORMAL HIGH (ref 70–99)
Glucose-Capillary: 128 mg/dL — ABNORMAL HIGH (ref 70–99)

## 2021-11-04 LAB — PROTIME-INR
INR: 1.1 (ref 0.8–1.2)
Prothrombin Time: 13.7 seconds (ref 11.4–15.2)

## 2021-11-04 IMAGING — XA IR REMOVE CALCULI/DEBRIS BILIARY DUCT/GB
7 of 8 series · 12 of 14 positions shown · non-contrast
Comparison: none

INDICATION: 70-year-old female with history of acute calculus cholecystitis
status post percutaneous cholecystostomy tube placement on
[DATE]. The patient has been deemed a poor surgical candidate,
therefore presents for second session (the first on [DATE]) of
percutaneous gallstone retrieval in hopes of eventually becoming
cholecystostomy tube free.

[Series 1: fl (-) angio · 1 of 1 slices shown (1 of 5)]
[im 1/1]
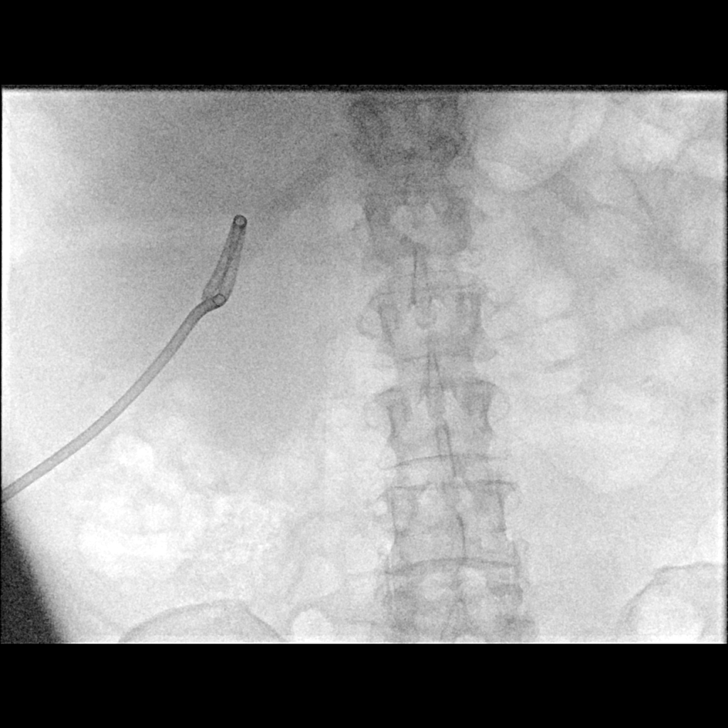

[Series 2: single · 1 of 1 slices shown (1 of 2)]
[im 1/1]
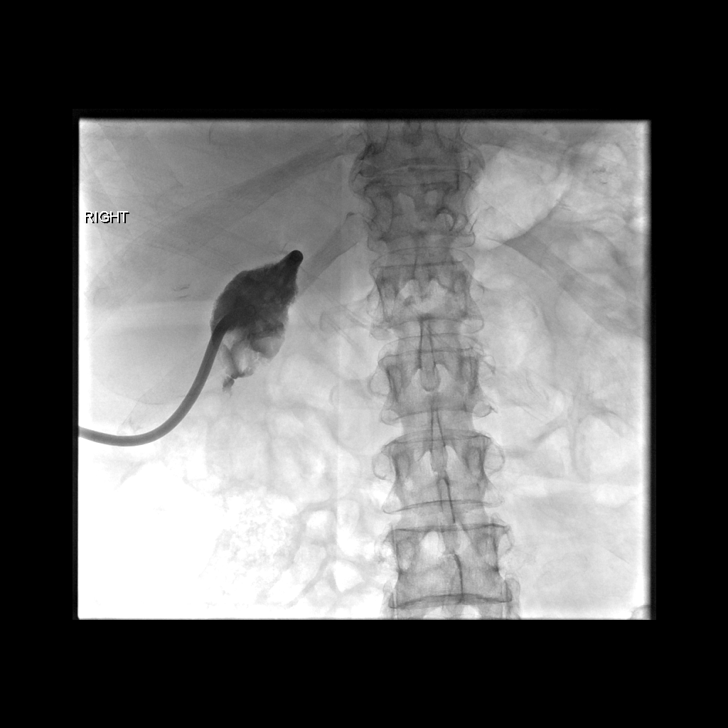

[Series 3: single · 1 of 1 slices shown (2 of 2)]
[im 1/1]
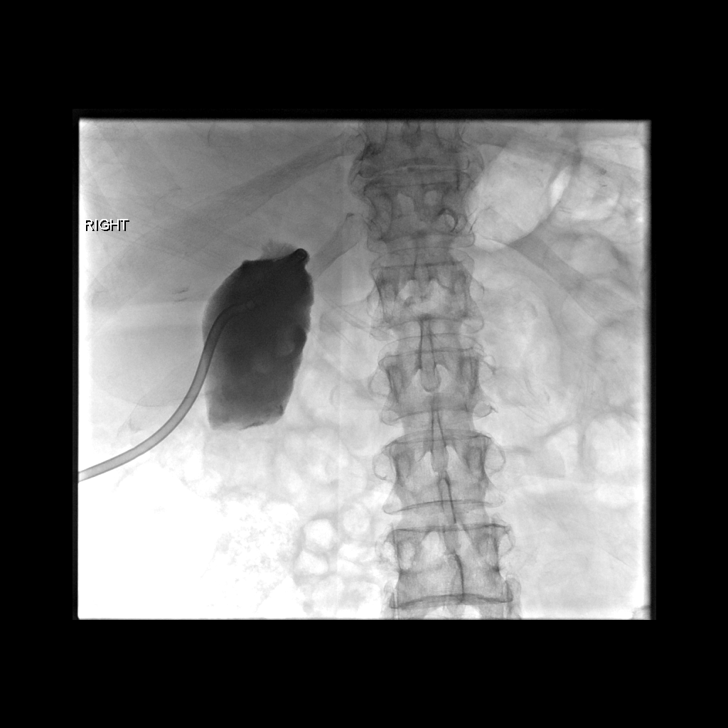

[Series 5: fl (-) angio · 4 of 44 frames shown (2 of 5)]
[frame 7/44]
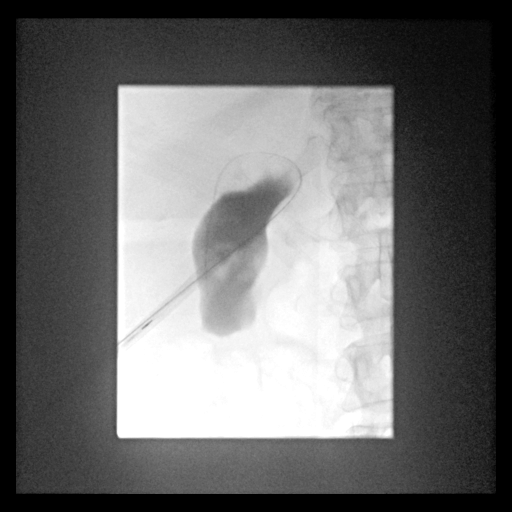
[frame 23/44]
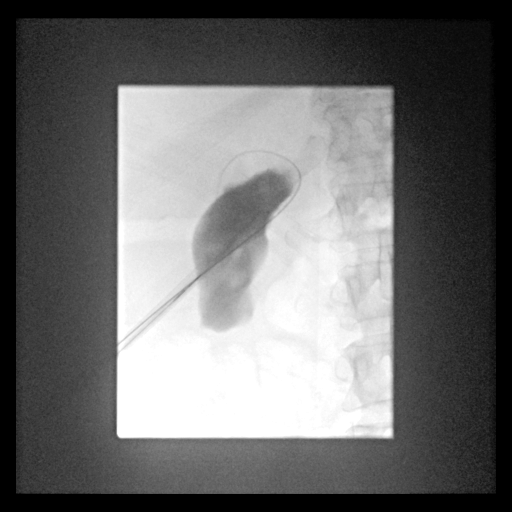
[frame 36/44]
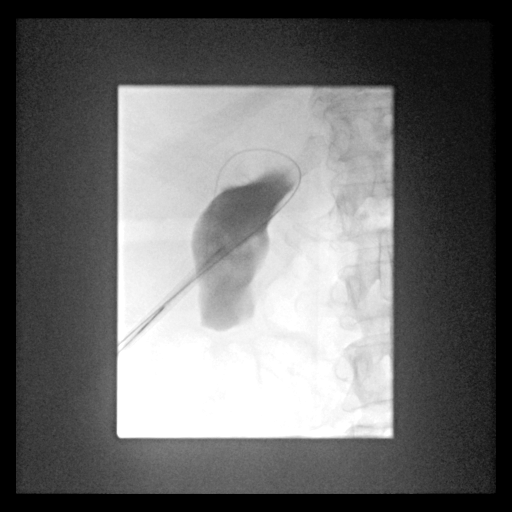
[frame 38/44]
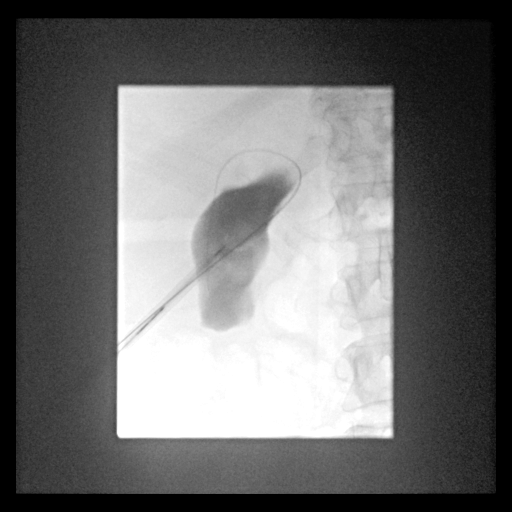

[Series 6: fl (-) angio · 3 of 60 frames shown (3 of 5)]
[frame 1/60]
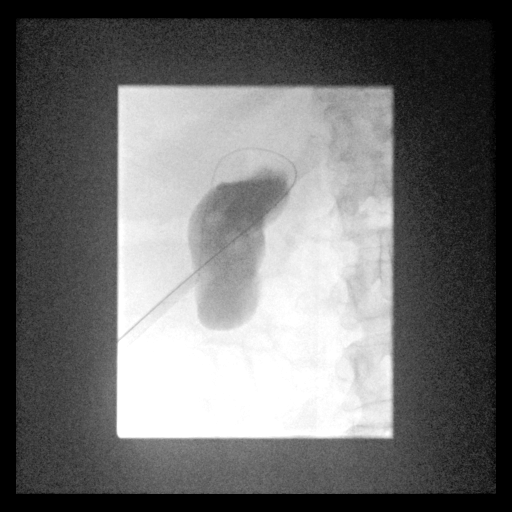
[frame 10/60]
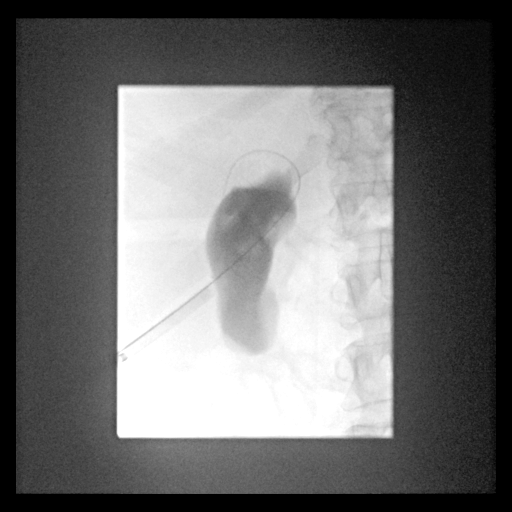
[frame 52/60]
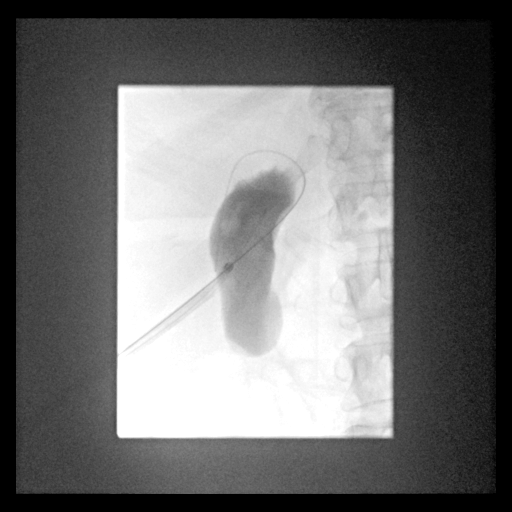

[Series 7: fl (-) angio · 1 of 1 slices shown (4 of 5)]
[im 1/1]
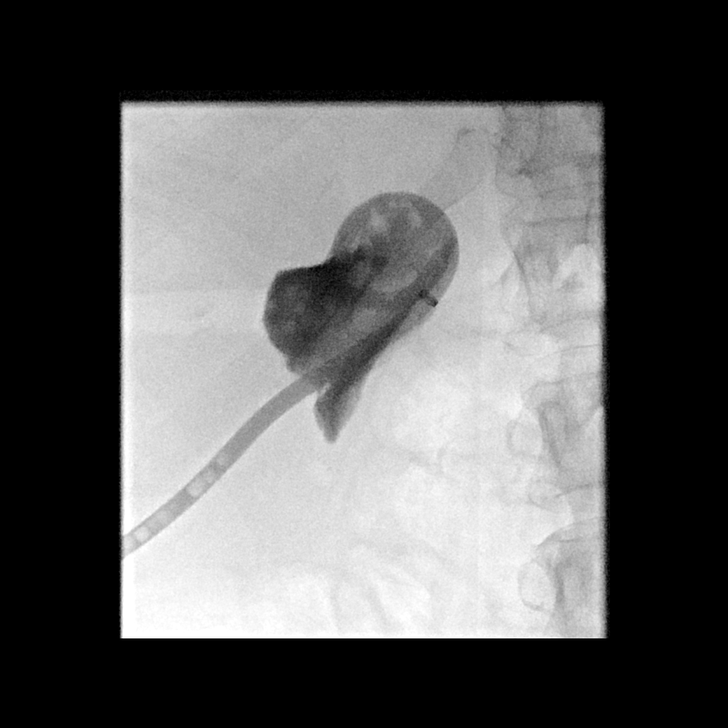

[Series 8: fl (-) angio · 1 of 1 slices shown (5 of 5)]
[im 1/1]
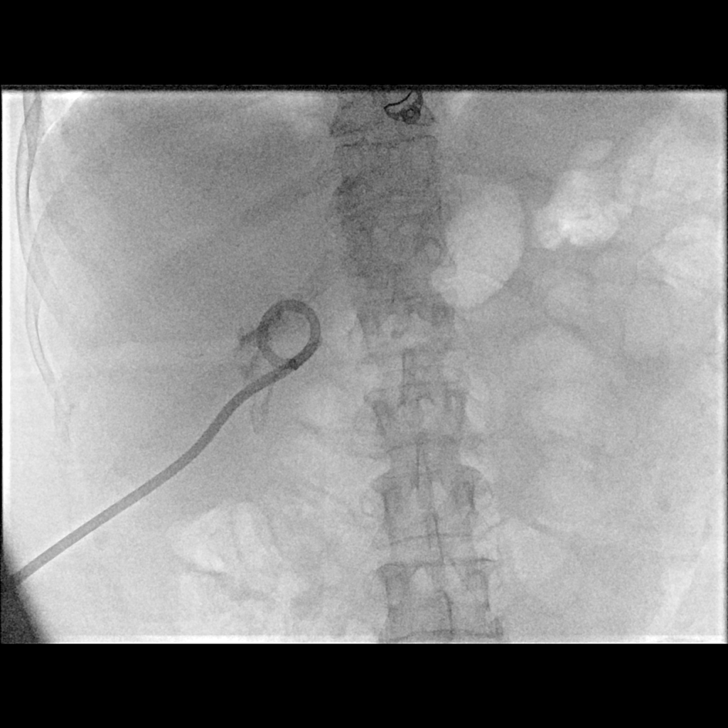

[12 of 14 positions shown; findings below may reference images not displayed]

EXAM:
1. Cholecystogram.
2. Choledochoscope assisted percutaneous gallstone lithotripsy.
3. Choledochoscope assisted percutaneous gallstone retrieval.
4. Cholecystostomy tube exchange.

MEDICATIONS:
Cefoxitin 2 g, intravenous; The antibiotic was administered within
an appropriate time frame prior to the initiation of the procedure.

ANESTHESIA/SEDATION:
Moderate (conscious) sedation was employed during this procedure. A
total of Versed 5.5 mg and Fentanyl 200 mcg was administered
intravenously.

Moderate Sedation Time: 1 hour, 54 minutes. The patient's level of
consciousness and vital signs were monitored continuously by
radiology nursing throughout the procedure under my direct
supervision.

FLUOROSCOPY TIME:  Fluoroscopy Time: 21.1 minutes (102 mGy).

COMPLICATIONS:
None immediate.

PROCEDURE:
Informed written consent was obtained from the patient after a
thorough discussion of the procedural risks, benefits and
alternatives. All questions were addressed. Maximal Sterile Barrier
Technique was utilized including caps, mask, sterile gowns, sterile
gloves, sterile drape, hand hygiene and skin antiseptic. A timeout
was performed prior to the initiation of the procedure.

The indwelling right cholecystostomy tube was prepped and draped in
standard fashion. Preprocedure scout image demonstrates the tube in
appropriate position in the right upper quadrant. Gentle hand
injection of contrast demonstrates multifocal filling defects in the
gallbladder lumen consistent with fragments of prior fragmented
gallstone. Despite moderate distension with contrast material. The
cystic duct is not visualized. Subdermal Local anesthesia was
provided to the tube entry site with 1% lidocaine. The indwelling
drain was cut to release the pigtail string and exchanged over an
Amplatz wire for a 16 French peel-away sheath.

Through the peel-away sheath, the Spyglass Discover scope was then
inserted and the gallstones were visualized. The lithotripsy device
was then used to fragment several of these gallstones. Next, a
cm FEYIME extraction basket was used, however no stone fragments
were able to be captured successfully. Over the indwelling Amplatz
wire, the peel-away sheath was upsized to a 20 French. Additional
basket retrieval was attempted which was also unsuccessful.
Therefore, the choledochoscope was re-inserted and the Spyglass
basket was then used for retrieval of a moderate volume of stone
fragments and sludge.

Throughout the procedure, the cystic duct remain unopacified.
Therefore, attempted cannulating the duct was made. An angled tip,
12 French sheath was inserted via the peel-away sheath and a 5
French angled tip catheter was used to probe for the cystic duct
orifice which was ultimately unsuccessful.

The catheters and sheaths were removed. A new 14 fr pigtail drain
was inserted with the pigtail formed in the gallbladder lumen.
Position was confirmed with contrast injection, which again
demonstrated multiple small round filling defects and occlusion of
the cystic duct.

The catheter was sutured at the skin entry site with a 0 silk
suture. Sterile bandage applied. The patient other the procedure
well without complication.

The procedure was formed in concert my associate, Dr. FEYIME.
IMPRESSION: 1. Percutaneous lithotripsy and gallstone retrieval with removal of
approximately 50% of the residual indwelling gallstone fragments.
The cystic duct is obstructed.
2. Replacement of 14 French cholecystostomy tube, placed to bag
drainage.

PLAN:
Return in 8 weeks for repeat choledochoscopic-assisted percutaneous
gallstone retrieval. Keep to bag drainage until this time.

## 2021-11-04 MED ORDER — FENTANYL CITRATE (PF) 100 MCG/2ML IJ SOLN
INTRAMUSCULAR | Status: AC
  Filled 2021-11-04: qty 4

## 2021-11-04 MED ORDER — MIDAZOLAM HCL 2 MG/2ML IJ SOLN
INTRAMUSCULAR | Status: AC | PRN
  Administered 2021-11-04: 1 mg via INTRAVENOUS
  Administered 2021-11-04 (×4): .5 mg via INTRAVENOUS
  Administered 2021-11-04 (×2): 1 mg via INTRAVENOUS
  Administered 2021-11-04: .5 mg via INTRAVENOUS

## 2021-11-04 MED ORDER — SODIUM CHLORIDE 0.9 % IV SOLN: INTRAVENOUS | Status: DC

## 2021-11-04 MED ORDER — MIDAZOLAM HCL 2 MG/2ML IJ SOLN
INTRAMUSCULAR | Status: AC
  Filled 2021-11-04: qty 4

## 2021-11-04 MED ORDER — IOHEXOL 300 MG/ML  SOLN
100.0000 mL | Freq: Once | INTRAMUSCULAR | Status: AC | PRN
  Administered 2021-11-04: 40 mL

## 2021-11-04 MED ORDER — SODIUM CHLORIDE 0.9% FLUSH: 5.0000 mL | Freq: Three times a day (TID) | INTRAVENOUS | Status: DC

## 2021-11-04 MED ORDER — FENTANYL CITRATE (PF) 100 MCG/2ML IJ SOLN
INTRAMUSCULAR | Status: AC | PRN
  Administered 2021-11-04 (×2): 50 ug via INTRAVENOUS
  Administered 2021-11-04 (×4): 25 ug via INTRAVENOUS

## 2021-11-04 MED ORDER — LIDOCAINE HCL 1 % IJ SOLN
INTRAMUSCULAR | Status: AC
  Filled 2021-11-04: qty 20

## 2021-11-04 MED ORDER — MIDAZOLAM HCL 2 MG/2ML IJ SOLN
INTRAMUSCULAR | Status: AC
  Filled 2021-11-04: qty 2

## 2021-11-04 MED ORDER — SODIUM CHLORIDE 0.9 % IV SOLN
2.0000 g | INTRAVENOUS | Status: AC
  Administered 2021-11-04: 2 g via INTRAVENOUS
  Filled 2021-11-04 (×3): qty 2

## 2021-11-04 NOTE — Discharge Instructions (Addendum)
Blood glucose meter kit #[id number]. Please call your primary care physician and have them send in a script for this monitor.  RESTART eliquis  TOMORROW 11/10

## 2021-11-04 NOTE — H&P (Signed)
Chief Complaint: Patient was seen in consultation today for Image guided Spyglass assisted gallstone fragmentation and retrieval at the request of Tracey Bryant,Dylan J  Referring Physician(s): Suzette Battiest  Supervising Physician: Ruthann Cancer  Patient Status: Tracey Bryant - Out-pt  History of Present Illness: Tracey Bryant is a 70 y.o. female with PMH of DM, DVT, HTN, paroxysmal a-fib and CVA. Pt had Spyglass assisted percutaneous gallstone fragmentation and retrieval procedure on 09/25/21 where only 40/50% of gallstones were removed d/t the large size. Pt presents today for Spyglass assisted retrieval to remove remaining gallstones.  Procedure Impression 09/25/21:  IMPRESSION: 1. Technically successful percutaneous lithotripsy resulting in fragmentation of single 2 cm gallstone. Approximately 40-50% of fragments were successfully removed percutaneously. 2. Replacement and upsize of cholecystostomy tube 214 [place] via the established track. The tube was placed to bag drainage.   PLAN: Return in 3-4 weeks for repeat Spyglass assisted percutaneous gallstone retrieval. Keep to bag drainage until this time.   Past Medical History:  Diagnosis Date  . Arthritis   . Cardiomyopathy ([place])   . Diabetes mellitus without complication ([place])   . DVT (deep venous thrombosis) ([place]) 07/26/2021  . Hypertension   . Paroxysmal atrial fibrillation ([place]) 07/26/2021  . Stroke [facility])     Past Surgical History:  Procedure Laterality Date  . breast tumor    . IR CATHETER TUBE CHANGE  09/25/2021  . IR CHOLANGIOGRAM EXISTING TUBE  05/13/2021  . IR EXCHANGE BILIARY DRAIN  07/27/2021  . IR RADIOLOGIST EVAL & MGMT  09/04/2021  . IR REMOVAL OF CALCULI/DEBRIS BILIARY DUCT/GB  09/25/2021  . PACEMAKER IMPLANT    . TONSILLECTOMY      Allergies: Patient has no known allergies.  Medications: Prior to Admission medications   Medication Sig Start Date End Date Taking? Authorizing Provider  apixaban (ELIQUIS) 5 MG TABS  tablet Take 1 tablet (5 mg total) by mouth 2 (two) times daily. 08/04/21  Yes Tracey Boss, MD  carvedilol (COREG) 6.25 MG tablet Take 1 tablet (6.25 mg total) by mouth 2 (two) times daily with a meal. 08/01/21  Yes Tracey Ishikawa, MD  ezetimibe (ZETIA) 10 MG tablet Take 10 mg by mouth daily.   Yes [provider]  furosemide (LASIX) 20 MG tablet Take 1 tablet (20 mg total) by mouth daily. 08/04/21  Yes Ray, [name], MD  isosorbide-hydrALAZINE (BIDIL) 20-37.5 MG tablet Take 1 tablet by mouth 3 (three) times daily. 08/01/21  Yes Tracey Ishikawa, MD  acetaminophen (TYLENOL) 500 MG tablet Take 1 tablet (500 mg total) by mouth every 8 (eight) hours as needed for moderate pain. 04/08/21 04/08/22  Tracey Lose, MD  insulin aspart (NOVOLOG) 100 UNIT/ML injection Substitute to any brand approved.Before each meal 3 times a day, 140-199 - 2 units, 200-250 - 4 units, 251-299 - 6 units,  300-349 - 8 units,  350 or above 10 units. Dispense syringes and needles as needed, Ok to switch to PEN if approved. DX DM2, Code E11.65 04/08/21   Tracey Lose, MD  insulin glargine (LANTUS) 100 UNIT/ML injection Inject 0.25 mLs (25 Units total) into the skin at bedtime. 04/08/21   Tracey Lose, MD     Family History  Problem Relation Age of Onset  . Cancer Mother   . Diabetes Mother   . Heart disease Father     Social History   Socioeconomic History  . Marital status: Divorced    Spouse name: Not on file  . Number of children:  Not on file  . Years of education: Not on file  . Highest education level: Not on file  Occupational History  . Not on file  Tobacco Use  . Smoking status: Never  . Smokeless tobacco: Never  Vaping Use  . Vaping Use: Never used  Substance and Sexual Activity  . Alcohol use: No  . Drug use: Never  . Sexual activity: Not on file  Other Topics Concern  . Not on file  Social History Narrative  . Not on file   Social Determinants of Health   Financial  Resource Strain: Not on file  Food Insecurity: Not on file  Transportation Needs: Not on file  Physical Activity: Not on file  Stress: Not on file  Social Connections: Not on file    Review of Systems: A 12 point ROS discussed and pertinent positives are indicated in the HPI above.  All other systems are negative.  Review of Systems  Constitutional:  Negative for chills and fever.  HENT:  Negative for nosebleeds.   Eyes:  Negative for visual disturbance.  Respiratory:  Positive for shortness of breath. Negative for cough.        DOE d/t CHF  Cardiovascular:  Negative for chest pain and leg swelling.  Gastrointestinal:  Negative for abdominal pain, blood in stool, nausea and vomiting.  Genitourinary:  Negative for hematuria.  Neurological:  Negative for dizziness, light-headedness and headaches.   Vital Signs: BP (!) 160/96 (BP Location: Left Arm)   Pulse 91   Temp 98 F (36.7 C) (Oral)   Ht 5\' 6"  (1.676 m)   Wt 155 lb (70.3 kg)   SpO2 98%   BMI 25.02 kg/m   Physical Exam Constitutional:      Appearance: Normal appearance. She is not ill-appearing.  HENT:     Head: Normocephalic and atraumatic.     Mouth/Throat:     Mouth: Mucous membranes are moist.     Pharynx: Oropharynx is clear.  Eyes:     Pupils: Pupils are equal, round, and reactive to light.  Cardiovascular:     Rate and Rhythm: Normal rate and regular rhythm.     Pulses: Normal pulses.     Heart sounds: No murmur heard.   No gallop.  Pulmonary:     Effort: Pulmonary effort is normal. No respiratory distress.     Breath sounds: Normal breath sounds. No stridor. No wheezing, rhonchi or rales.  Abdominal:     General: Bowel sounds are normal.     Palpations: Abdomen is soft.     Tenderness: There is abdominal tenderness. There is no guarding.     Comments: RLQ cholecystostomy tube in place to gravity bag  Musculoskeletal:     Right lower leg: No edema.     Left lower leg: No edema.  Skin:    General:  Skin is warm and dry.  Neurological:     Mental Status: She is alert and oriented to person, place, and time.  Psychiatric:        Mood and Affect: Mood normal.        Behavior: Behavior normal.        Thought Content: Thought content normal.        Judgment: Judgment normal.    Imaging: No results found.  Labs:  CBC: Recent Labs    07/27/21 0354 07/27/21 1418 08/04/21 1350 11/04/21 0818  WBC 10.6* 10.3 9.7 8.6  HGB 10.1* 10.3* 9.7* 13.7  HCT 32.2* 32.4* 31.7*  42.4  PLT 475* 322 387 342    COAGS: Recent Labs    03/19/21 0504 03/20/21 0147 03/21/21 0115 11/04/21 0818  INR 1.4* 1.6* 1.5* 1.1    BMP: Recent Labs    07/30/21 0147 07/31/21 0024 08/01/21 0027 08/04/21 1350  NA 131* 132* 131* 136  K 4.4 4.5 4.7 4.4  CL 100 101 102 107  CO2 23 23 20* 20*  GLUCOSE 188* 145* 131* 53*  BUN 28* 32* 35* 26*  CALCIUM 8.3* 8.4* 8.3* 8.5*  CREATININE 2.48* 2.58* 2.43* 1.86*  GFRNONAA 21* 20* 21* 29*    LIVER FUNCTION TESTS: Recent Labs    04/06/21 0829 04/07/21 0110 07/26/21 1601 08/04/21 1350  BILITOT 0.6 0.8 0.9 0.7  AST 19 23 17 19   ALT 21 20 10 13   ALKPHOS 48 52 54 55  PROT 6.5 6.6 6.5 7.0  ALBUMIN 2.3* 2.5* 2.9* 2.9*    TUMOR MARKERS: No results for input(s): AFPTM, CEA, CA199, CHROMGRNA in the last 8760 hours.  Assessment and Plan: History of DM, DVT, HTN, paroxysmal a-fib and CVA. Pt had spyglass-assisted percutaneous gallstone fragmentation and retrieval procedure on 09/25/21 where only 40/50% of gallstones were removed d/t the large size. Pt presents today for Spyglass assisted retrieval to remove remaining gallstones.   Pt resting quietly in bed. She is A&O, calm and pleasant.  She states she is NPO per order.  She is in NAD. Labs today are WNL.  Risks and benefits discussed with the patient including, but not limited to bleeding, infection, gallbladder perforation, bile leak, sepsis or even death.  All of the patient's questions were  answered, patient is agreeable to proceed. Consent signed and in chart.   Thank you for this interesting consult.  I greatly enjoyed meeting Aarianna D Brisk and look forward to participating in their care.  A copy of this report was sent to the requesting provider on this date.  Electronically Signed: Tyson Alias, NP 11/04/2021, 9:19 AM   I spent a total of 30 minutes in face to face in clinical consultation, greater than 50% of which was counseling/coordinating care for Image guided Spyglass assisted gallstone fragmentation and retrieval.

## 2021-11-04 NOTE — Procedures (Signed)
Interventional Radiology Procedure Note  Procedure:  1) Choledochoscopic evaluation of the gallbladder 2) Gallstone lithotripsy 3) Percutaneous gallstone retrieval 4) Cholecystostomy tube exchange  Findings: Please refer to procedural dictation for full description. Cystic duct remains occluded.  Several large fragments of stones remain.  Lithotripsy of several larger fragments performed.  Basket retrieval of several stone fragments. Interrogation/probing to cannulate cystic duct unsuccessful.  Complications: None immediate  Estimated Blood Loss: < 5 mL  Recommendations: Keep cholecystotomy tube to bag drainage. Return in 4-6 weeks for repeat gallstone retrieval attempt.   Ruthann Cancer, MD Pager: [phone]

## 2021-11-04 NOTE — Progress Notes (Addendum)
Inpatient Diabetes Program Recommendations  AACE/ADA: New Consensus Statement on Inpatient Glycemic Control (2015)  Target Ranges:  Prepandial:   less than 140 mg/dL      Peak postprandial:   less than 180 mg/dL (1-2 hours)      Critically ill patients:  140 - 180 mg/dL   Lab Results  Component Value Date   GLUCAP 190 (H) 11/04/2021   HGBA1C 9.1 (H) 03/16/2021    Review of Glycemic Control Results for Tracey Bryant, Tracey Bryant (MRN [id number]) as of 11/04/2021 09:03  Ref. Range 11/04/2021 08:33  Glucose-Capillary Latest Ref Range: 70 - 99 mg/dL 190 (H)   Diabetes history: Type 2 Dm Outpatient Diabetes medications: none Current orders for Inpatient glycemic control: none  Inpatient Diabetes Program Recommendations:    Page received regarding insulin teaching. Per med rec patient has been prescribed Novolog and lantus, however has not been taking due to "not feeling comfortable with injections and having lows". Per chart review from 07/2021 DM coordinator made recommendations to discontinue insulin due to hypoglycemia. No updated A1C at this time. Previous was from 3/22 and DM coordinator discussed/ provided education on insulin pen teaching.  Collene Leyden, patient's son to discuss DM management. Patient and son not taking insulin or checking CBGs. Son lives separately from mother and did not feel comfortable with insulin administration.  Will need glucose meter at discharge. Blood glucose meter kit #[id number]. Reviewed recommended frequency and when to call MD.  Patient is scheduled to follow up with PCP and son plans to attend. Encouraged to request updated A1C and form treatment plan based on findings. Has been on oral agents in the past.  At this time son has no further questions and will work towards checking blood sugars.   Thanks, Bronson Curb, MSN, RNC-OB Diabetes Coordinator [phone] (8a-5p)

## 2021-12-25 ENCOUNTER — Emergency Department (HOSPITAL_COMMUNITY): Payer: Medicare HMO

## 2021-12-25 ENCOUNTER — Encounter (HOSPITAL_COMMUNITY): Payer: Self-pay

## 2021-12-25 ENCOUNTER — Other Ambulatory Visit: Payer: Self-pay

## 2021-12-25 ENCOUNTER — Inpatient Hospital Stay (HOSPITAL_COMMUNITY)
Admission: EM | Admit: 2021-12-25 | Discharge: 2021-12-30 | DRG: 291 | Disposition: A | Discharge status: Home Health | Payer: Medicare HMO | Attending: Family Medicine | Admitting: Family Medicine

## 2021-12-25 DIAGNOSIS — R778 Other specified abnormalities of plasma proteins: Secondary | ICD-10-CM | POA: Diagnosis not present

## 2021-12-25 DIAGNOSIS — E876 Hypokalemia: Secondary | ICD-10-CM | POA: Diagnosis not present

## 2021-12-25 DIAGNOSIS — Z7901 Long term (current) use of anticoagulants: Secondary | ICD-10-CM

## 2021-12-25 DIAGNOSIS — Z6825 Body mass index (BMI) 25.0-25.9, adult: Secondary | ICD-10-CM | POA: Diagnosis not present

## 2021-12-25 DIAGNOSIS — Z8673 Personal history of transient ischemic attack (TIA), and cerebral infarction without residual deficits: Secondary | ICD-10-CM | POA: Diagnosis not present

## 2021-12-25 DIAGNOSIS — E46 Unspecified protein-calorie malnutrition: Secondary | ICD-10-CM | POA: Diagnosis not present

## 2021-12-25 DIAGNOSIS — I5021 Acute systolic (congestive) heart failure: Secondary | ICD-10-CM | POA: Diagnosis not present

## 2021-12-25 DIAGNOSIS — Z79899 Other long term (current) drug therapy: Secondary | ICD-10-CM

## 2021-12-25 DIAGNOSIS — N179 Acute kidney failure, unspecified: Secondary | ICD-10-CM

## 2021-12-25 DIAGNOSIS — Z20822 Contact with and (suspected) exposure to covid-19: Secondary | ICD-10-CM | POA: Diagnosis present

## 2021-12-25 DIAGNOSIS — I639 Cerebral infarction, unspecified: Secondary | ICD-10-CM | POA: Diagnosis present

## 2021-12-25 DIAGNOSIS — I82409 Acute embolism and thrombosis of unspecified deep veins of unspecified lower extremity: Secondary | ICD-10-CM | POA: Diagnosis present

## 2021-12-25 DIAGNOSIS — I480 Paroxysmal atrial fibrillation: Secondary | ICD-10-CM | POA: Diagnosis present

## 2021-12-25 DIAGNOSIS — Z8249 Family history of ischemic heart disease and other diseases of the circulatory system: Secondary | ICD-10-CM

## 2021-12-25 DIAGNOSIS — E1151 Type 2 diabetes mellitus with diabetic peripheral angiopathy without gangrene: Secondary | ICD-10-CM | POA: Diagnosis present

## 2021-12-25 DIAGNOSIS — E441 Mild protein-calorie malnutrition: Secondary | ICD-10-CM | POA: Diagnosis present

## 2021-12-25 DIAGNOSIS — I248 Other forms of acute ischemic heart disease: Secondary | ICD-10-CM | POA: Diagnosis present

## 2021-12-25 DIAGNOSIS — E785 Hyperlipidemia, unspecified: Secondary | ICD-10-CM | POA: Diagnosis present

## 2021-12-25 DIAGNOSIS — I5023 Acute on chronic systolic (congestive) heart failure: Secondary | ICD-10-CM | POA: Diagnosis present

## 2021-12-25 DIAGNOSIS — J9601 Acute respiratory failure with hypoxia: Secondary | ICD-10-CM | POA: Diagnosis present

## 2021-12-25 DIAGNOSIS — Z86718 Personal history of other venous thrombosis and embolism: Secondary | ICD-10-CM | POA: Diagnosis not present

## 2021-12-25 DIAGNOSIS — I130 Hypertensive heart and chronic kidney disease with heart failure and stage 1 through stage 4 chronic kidney disease, or unspecified chronic kidney disease: Principal | ICD-10-CM | POA: Diagnosis present

## 2021-12-25 DIAGNOSIS — D75839 Thrombocytosis, unspecified: Secondary | ICD-10-CM | POA: Diagnosis present

## 2021-12-25 DIAGNOSIS — Z833 Family history of diabetes mellitus: Secondary | ICD-10-CM | POA: Diagnosis not present

## 2021-12-25 DIAGNOSIS — E1122 Type 2 diabetes mellitus with diabetic chronic kidney disease: Secondary | ICD-10-CM | POA: Diagnosis present

## 2021-12-25 DIAGNOSIS — N184 Chronic kidney disease, stage 4 (severe): Secondary | ICD-10-CM | POA: Diagnosis present

## 2021-12-25 DIAGNOSIS — E8809 Other disorders of plasma-protein metabolism, not elsewhere classified: Secondary | ICD-10-CM | POA: Diagnosis present

## 2021-12-25 DIAGNOSIS — R7989 Other specified abnormal findings of blood chemistry: Secondary | ICD-10-CM | POA: Diagnosis not present

## 2021-12-25 DIAGNOSIS — J810 Acute pulmonary edema: Secondary | ICD-10-CM | POA: Diagnosis present

## 2021-12-25 DIAGNOSIS — Z950 Presence of cardiac pacemaker: Secondary | ICD-10-CM

## 2021-12-25 DIAGNOSIS — E1165 Type 2 diabetes mellitus with hyperglycemia: Secondary | ICD-10-CM | POA: Diagnosis present

## 2021-12-25 DIAGNOSIS — I509 Heart failure, unspecified: Secondary | ICD-10-CM | POA: Insufficient documentation

## 2021-12-25 LAB — CBC WITH DIFFERENTIAL/PLATELET
Abs Immature Granulocytes: 0.05 10*3/uL (ref 0.00–0.07)
Basophils Absolute: 0.1 10*3/uL (ref 0.0–0.1)
Basophils Relative: 1 %
Eosinophils Absolute: 0 10*3/uL (ref 0.0–0.5)
Eosinophils Relative: 0 %
HCT: 37.2 % (ref 36.0–46.0)
Hemoglobin: 11.9 g/dL — ABNORMAL LOW (ref 12.0–15.0)
Immature Granulocytes: 1 %
Lymphocytes Relative: 8 %
Lymphs Abs: 0.8 10*3/uL (ref 0.7–4.0)
MCH: 30.1 pg (ref 26.0–34.0)
MCHC: 32 g/dL (ref 30.0–36.0)
MCV: 93.9 fL (ref 80.0–100.0)
Monocytes Absolute: 0.4 10*3/uL (ref 0.1–1.0)
Monocytes Relative: 4 %
Neutro Abs: 9 10*3/uL — ABNORMAL HIGH (ref 1.7–7.7)
Neutrophils Relative %: 86 %
Platelets: 410 10*3/uL — ABNORMAL HIGH (ref 150–400)
RBC: 3.96 MIL/uL (ref 3.87–5.11)
RDW: 15.9 % — ABNORMAL HIGH (ref 11.5–15.5)
WBC: 10.3 10*3/uL (ref 4.0–10.5)
nRBC: 0 % (ref 0.0–0.2)

## 2021-12-25 LAB — COMPREHENSIVE METABOLIC PANEL
ALT: 12 U/L (ref 0–44)
AST: 17 U/L (ref 15–41)
Albumin: 3.1 g/dL — ABNORMAL LOW (ref 3.5–5.0)
Alkaline Phosphatase: 73 U/L (ref 38–126)
Anion gap: 13 (ref 5–15)
BUN: 36 mg/dL — ABNORMAL HIGH (ref 8–23)
CO2: 20 mmol/L — ABNORMAL LOW (ref 22–32)
Calcium: 8.6 mg/dL — ABNORMAL LOW (ref 8.9–10.3)
Chloride: 102 mmol/L (ref 98–111)
Creatinine, Ser: 2.52 mg/dL — ABNORMAL HIGH (ref 0.44–1.00)
GFR, Estimated: 20 mL/min — ABNORMAL LOW (ref >60)
Glucose, Bld: 276 mg/dL — ABNORMAL HIGH (ref 70–99)
Potassium: 4.2 mmol/L (ref 3.5–5.1)
Sodium: 135 mmol/L (ref 135–145)
Total Bilirubin: 0.9 mg/dL (ref 0.3–1.2)
Total Protein: 7.4 g/dL (ref 6.5–8.1)

## 2021-12-25 LAB — BLOOD GAS, ARTERIAL
Acid-base deficit: 2.5 mmol/L — ABNORMAL HIGH (ref 0.0–2.0)
Bicarbonate: 22.6 mmol/L (ref 20.0–28.0)
Drawn by: 10351
FIO2: 40
O2 Saturation: 85.6 %
Patient temperature: 37
pCO2 arterial: 29.3 mmHg — ABNORMAL LOW (ref 32.0–48.0)
pH, Arterial: 7.462 — ABNORMAL HIGH (ref 7.350–7.450)
pO2, Arterial: 51.1 mmHg — ABNORMAL LOW (ref 83.0–108.0)

## 2021-12-25 LAB — RESP PANEL BY RT-PCR (FLU A&B, COVID) ARPGX2
Influenza A by PCR: NEGATIVE
Influenza B by PCR: NEGATIVE
SARS Coronavirus 2 by RT PCR: NEGATIVE

## 2021-12-25 LAB — TROPONIN I (HIGH SENSITIVITY): Troponin I (High Sensitivity): 384 ng/L (ref <18)

## 2021-12-25 LAB — BRAIN NATRIURETIC PEPTIDE: B Natriuretic Peptide: 3291 pg/mL — ABNORMAL HIGH (ref 0.0–100.0)

## 2021-12-25 IMAGING — DX DG CHEST 1V PORT
1 series · 1 of 1 positions shown · non-contrast
Comparison: Chest x-ray [DATE].

CLINICAL DATA: Shortness of breath, CHF.

EXAM:
PORTABLE CHEST 1 VIEW

[chest ap]
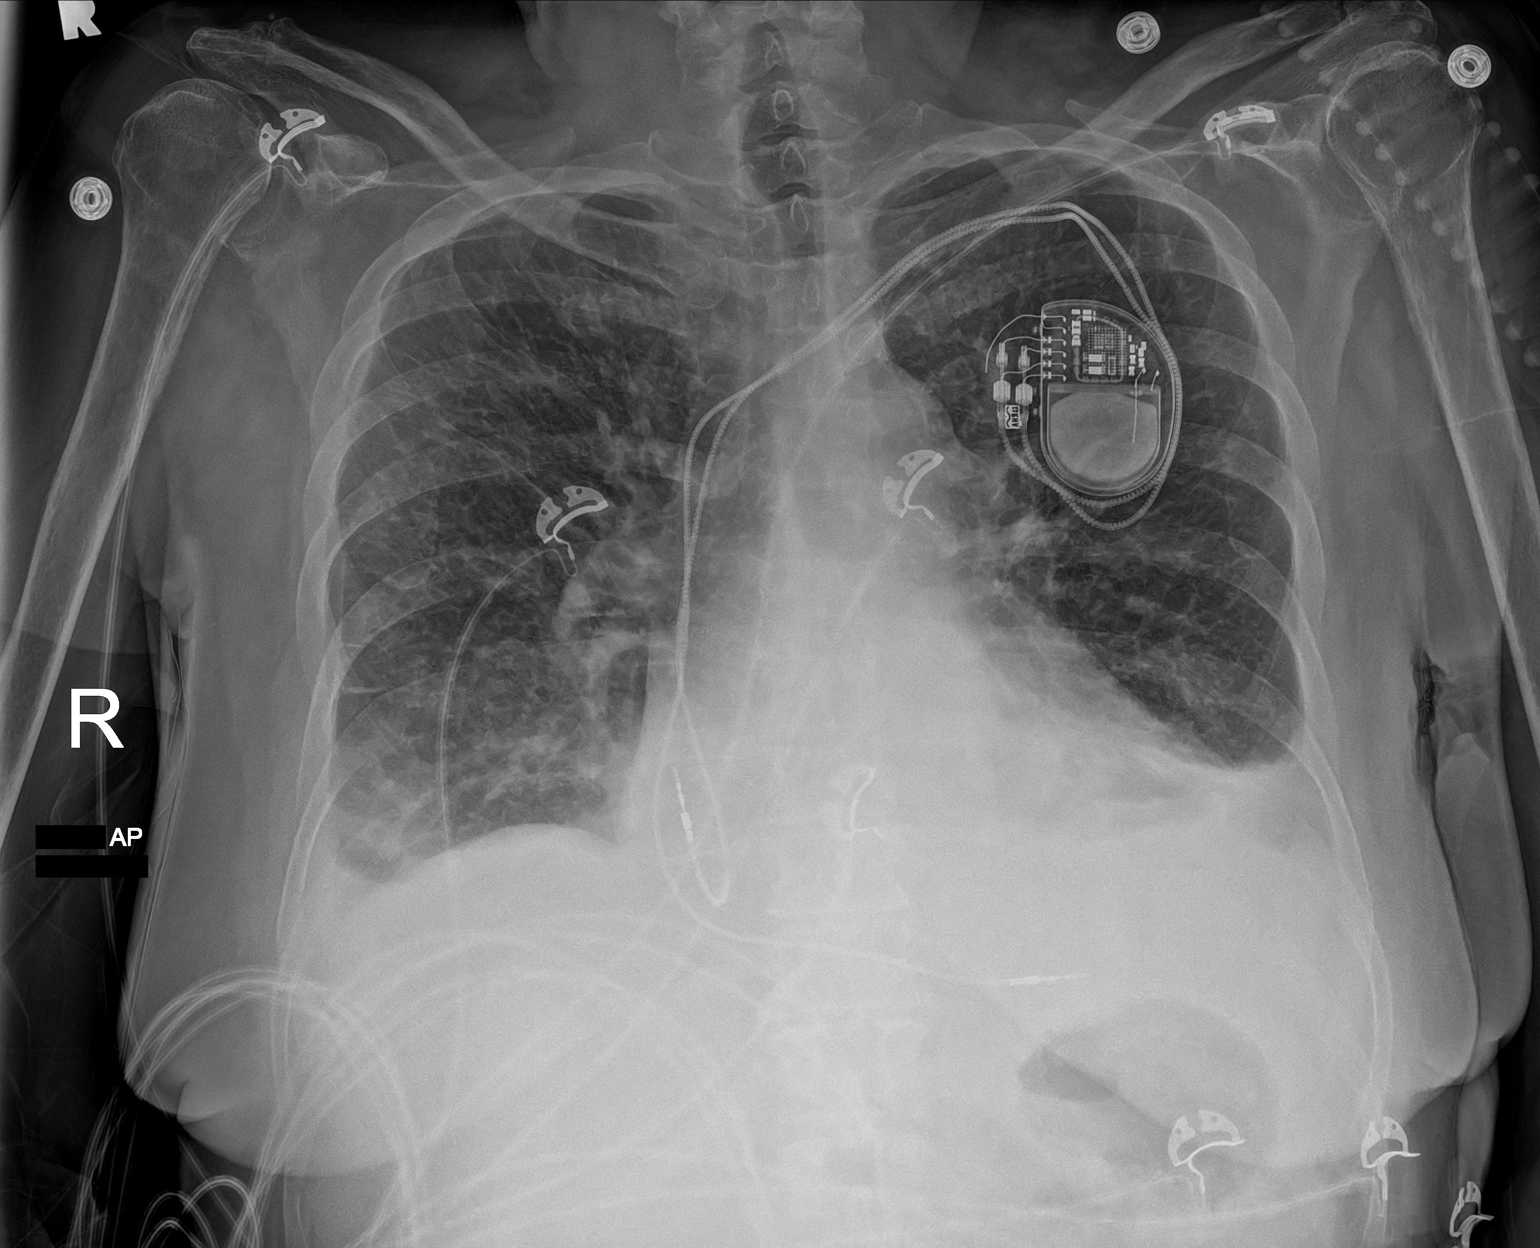

[1 of 1 positions shown; findings below may reference images not displayed]

FINDINGS: The heart is enlarged, unchanged. Left-sided pacemaker is again
noted. There are small bilateral pleural effusions. There are
central interstitial opacities bilaterally. There are some patchy
opacities in both lung bases. There is no evidence for pneumothorax
or acute fracture.
IMPRESSION: 1. Cardiomegaly with mild interstitial edema and small pleural
effusions.

## 2021-12-25 MED ORDER — ACETAMINOPHEN 325 MG PO TABS
650.0000 mg | ORAL_TABLET | ORAL | Status: DC | PRN
  Administered 2021-12-26 – 2021-12-27 (×2): 650 mg via ORAL
  Filled 2021-12-25 (×2): qty 2

## 2021-12-25 MED ORDER — ENOXAPARIN SODIUM 30 MG/0.3ML IJ SOSY: 30.0000 mg | PREFILLED_SYRINGE | INTRAMUSCULAR | Status: DC

## 2021-12-25 MED ORDER — SODIUM CHLORIDE 0.9% FLUSH: 3.0000 mL | INTRAVENOUS | Status: DC | PRN

## 2021-12-25 MED ORDER — FUROSEMIDE 10 MG/ML IJ SOLN
80.0000 mg | Freq: Once | INTRAMUSCULAR | Status: AC
  Administered 2021-12-25: 22:00:00 80 mg via INTRAVENOUS
  Filled 2021-12-25: qty 8

## 2021-12-25 MED ORDER — ASPIRIN 81 MG PO CHEW
324.0000 mg | CHEWABLE_TABLET | Freq: Once | ORAL | Status: AC
  Administered 2021-12-25: 22:00:00 324 mg via ORAL
  Filled 2021-12-25: qty 4

## 2021-12-25 MED ORDER — SODIUM CHLORIDE 0.9% FLUSH
3.0000 mL | Freq: Two times a day (BID) | INTRAVENOUS | Status: DC
  Administered 2021-12-26 – 2021-12-29 (×9): 3 mL via INTRAVENOUS

## 2021-12-25 MED ORDER — SODIUM CHLORIDE 0.9 % IV SOLN: 250.0000 mL | INTRAVENOUS | Status: DC | PRN

## 2021-12-25 MED ORDER — ONDANSETRON HCL 4 MG/2ML IJ SOLN: 4.0000 mg | Freq: Four times a day (QID) | INTRAMUSCULAR | Status: DC | PRN

## 2021-12-25 NOTE — ED Provider Notes (Signed)
[facility] Provider Note   CSN: [id number] Arrival date & time: 12/25/21  2019     History Chief Complaint  Patient presents with   Shortness of Breath    Tracey Bryant is a 70 y.o. female.   Shortness of Breath  This patient is a 70 year old female, she has a known history of cardiomyopathy, she has paroxysmal atrial fibrillation prior stroke prior DVT and diabetes.  The patient is currently on medications including but not limited to Lasix, NovoLog, Lantus, hydralazine, Coreg, Eliquis and Tylenol.  The patient reports to me that she has been given the Lasix in the past to treat her swelling that she gets because of her heart failure but has been told to take it only intermittently because it can cause some kidney problems.  She had been off of the medicine for a while but in the last 72 hours she has developed some increasing shortness of breath which is rapidly worsened.  She started to take the Lasix again today but it is not helped and she presented with an oxygen of 80% on room air by paramedics who required 6 L of nasal cannula to get her back up into a reasonable above 90% range.  The patient denies fevers or chills, she denies coughing up any sputum but is just had a dry cough.  She does endorse edema of her legs and in fact feels swollen around her abdomen and her face.  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 failure with reduced ejection fraction) ([place]) 07/27/2021   Acute on chronic combined systolic and diastolic congestive heart failure ([place]) 07/26/2021   Paroxysmal atrial fibrillation ([place]) 07/26/2021   DVT (deep venous thrombosis) ([place]) 07/26/2021    Normocytic anemia 07/26/2021   Cholecystostomy tube dysfunction 07/26/2021   Moderate protein-calorie malnutrition ([place]) 07/26/2021   Posterior cerebral artery embolism, right    Severe sepsis ([place]) 03/19/2021   Acute cholecystitis 03/19/2021   Acute lower UTI 03/19/2021   Cholelithiasis without cholangitis 03/18/2021   Uncontrolled type 2 diabetes mellitus with hyperglycemia, with long-term current use of insulin ([place]) 03/18/2021   DKA (diabetic ketoacidosis) ([place]) 03/16/2021   AKI (acute kidney injury) ([place]) 03/16/2021   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[place]) 03/16/2021   Acute posthemorrhagic anemia 03/16/2021   Epistaxis 03/16/2021   Chronic anticoagulation 03/16/2021   CVA (cerebral vascular accident) ([place]) 03/16/2021   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   Type II diabetes mellitus ([place]) 02/15/2013   Hypertension 02/15/2013   Gall bladder stones 02/15/2013   Multiple lung nodules 02/15/2013    Past Surgical History:  Procedure Laterality Date   breast tumor     IR CATHETER TUBE CHANGE  09/25/2021   IR CHOLANGIOGRAM EXISTING TUBE  05/13/2021   IR EXCHANGE BILIARY DRAIN  07/27/2021   IR EXCHANGE BILIARY DRAIN  11/04/2021   IR RADIOLOGIST EVAL & MGMT  09/04/2021   IR REMOVAL OF CALCULI/DEBRIS BILIARY DUCT/GB  09/25/2021   IR REMOVAL OF CALCULI/DEBRIS BILIARY DUCT/GB  11/04/2021   PACEMAKER IMPLANT     TONSILLECTOMY       OB History   No obstetric history on file.  Family History  Problem Relation Age of Onset   Cancer Mother    Diabetes Mother    Heart disease Father     Social History   Tobacco Use   Smoking status: Never   Smokeless tobacco: Never  Vaping Use   Vaping Use: Never used  Substance Use Topics   Alcohol use: No   Drug use: Never    Home Medications Prior to Admission medications   Medication Sig Start Date End Date Taking? Authorizing Provider  acetaminophen (TYLENOL) 500 MG tablet Take 1 tablet (500 mg total) by mouth every 8  (eight) hours as needed for moderate pain. 04/08/21 04/08/22  [name], MD  apixaban (ELIQUIS) 5 MG TABS tablet Take 1 tablet (5 mg total) by mouth 2 (two) times daily. 08/04/21   Pattricia Boss, MD  carvedilol (COREG) 6.25 MG tablet Take 1 tablet (6.25 mg total) by mouth 2 (two) times daily with a meal. 08/01/21   [name], MD  ezetimibe (ZETIA) 10 MG tablet Take 10 mg by mouth daily.    [provider]  furosemide (LASIX) 20 MG tablet Take 1 tablet (20 mg total) by mouth daily. 08/04/21   Pattricia Boss, MD  insulin aspart (NOVOLOG) 100 UNIT/ML injection Substitute to any brand approved.Before each meal 3 times a day, 140-199 - 2 units, 200-250 - 4 units, 251-299 - 6 units,  300-349 - 8 units,  350 or above 10 units. Dispense syringes and needles as needed, Ok to switch to PEN if approved. DX DM2, Code E11.65 04/08/21   [name], MD  insulin glargine (LANTUS) 100 UNIT/ML injection Inject 0.25 mLs (25 Units total) into the skin at bedtime. 04/08/21   [name], MD  isosorbide-hydrALAZINE (BIDIL) 20-37.5 MG tablet Take 1 tablet by mouth 3 (three) times daily. 08/01/21   [name], MD    Allergies    Patient has no known allergies.  Review of Systems   Review of Systems  Respiratory:  Positive for shortness of breath.   All other systems reviewed and are negative.  Physical Exam Updated Vital Signs BP (!) 172/76 (BP Location: Right Arm)    Pulse 80    Temp 98.4 F (36.9 C) (Oral)    Resp (!) 36    Ht 1.676 m (5\' 6" )    Wt 70.3 kg    SpO2 91%    BMI 25.02 kg/m   Physical Exam Vitals and nursing note reviewed.  Constitutional:      General: She is in acute distress.     Appearance: She is well-developed. She is ill-appearing.  HENT:     Head: Normocephalic and atraumatic.     Mouth/Throat:     Pharynx: No oropharyngeal exudate.  Eyes:     General: No scleral icterus.       Right eye: No discharge.        Left eye: No discharge.      Conjunctiva/sclera: Conjunctivae normal.     Pupils: Pupils are equal, round, and reactive to light.  Neck:     Thyroid: No thyromegaly.     Vascular: No JVD.  Cardiovascular:     Rate and Rhythm: Normal rate and regular rhythm.     Heart sounds: Normal heart sounds. No murmur heard.   No friction rub. No gallop.  Pulmonary:     Effort: Respiratory distress present.     Breath sounds: Wheezing, rhonchi and rales present.  Abdominal:  General: Bowel sounds are normal. There is no distension.     Palpations: Abdomen is soft. There is no mass.     Tenderness: There is no abdominal tenderness.  Musculoskeletal:        General: No tenderness. Normal range of motion.     Cervical back: Normal range of motion and neck supple.     Right lower leg: Edema present.     Left lower leg: Edema present.  Lymphadenopathy:     Cervical: No cervical adenopathy.  Skin:    General: Skin is warm and dry.     Findings: No erythema or rash.  Neurological:     Mental Status: She is alert.     Coordination: Coordination normal.  Psychiatric:        Behavior: Behavior normal.    ED Results / Procedures / Treatments   Labs (all labs ordered are listed, but only abnormal results are displayed) Labs Reviewed  RESP PANEL BY RT-PCR (FLU A&B, COVID) [id number]  CBC WITH DIFFERENTIAL/PLATELET  COMPREHENSIVE METABOLIC PANEL  BRAIN NATRIURETIC PEPTIDE  BLOOD GAS, ARTERIAL  TROPONIN I (HIGH SENSITIVITY)    EKG EKG Interpretation  Date/Time:  Friday December 25 2021 20:30:52 EST Ventricular Rate:  81 PR Interval:  160 QRS Duration: 94 QT Interval:  393 QTC Calculation: 457 R Axis:   22 Text Interpretation: Sinus rhythm Left atrial enlargement Borderline low voltage, extremity leads LVH with secondary repolarization abnormality Anterior ST elevation, probably due to LVH Confirmed by Noemi Chapel [phone]) on 12/25/2021 9:21:39 PM  Radiology DG Chest Port 1 View  Result Date: 12/25/2021 CLINICAL  DATA:  Shortness of breath, CHF. EXAM: PORTABLE CHEST 1 VIEW COMPARISON:  Chest x-ray 08/04/2021. FINDINGS: The heart is enlarged, unchanged. 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 There are small bilateral pleural effusions. There are central interstitial opacities bilaterally. There are some patchy opacities in both lung bases. There is no evidence for pneumothorax or acute fracture. IMPRESSION: 1. Cardiomegaly with mild interstitial edema and small pleural effusions. Electronically Signed   By: Ronney Asters M.D.   On: 12/25/2021 21:32    Procedures .Critical Care Performed by: Noemi Chapel, MD Authorized by: Noemi Chapel, MD   Critical care provider statement:    Critical care time (minutes):  35   Critical care time was exclusive of:  Teaching time and separately billable procedures and treating other patients   Critical care was necessary to treat or prevent imminent or life-threatening deterioration of the following conditions:  Respiratory failure and cardiac failure   Critical care was time spent personally by me on the following activities:  Development of treatment plan with patient or surrogate, discussions with consultants, evaluation of patient's response to treatment, examination of patient, ordering and review of laboratory studies, ordering and review of radiographic studies, ordering and performing treatments and interventions, pulse oximetry, re-evaluation of patient's condition and review of old charts   I assumed direction of critical care for this patient from another provider in my specialty: no     Care discussed with: admitting provider   Comments:         Medications Ordered in ED Medications  furosemide (LASIX) injection 80 mg (has no administration in time range)  aspirin chewable tablet 324 mg (has no administration in time range)    ED Course  I have reviewed the triage vital signs and the nursing notes.  Pertinent labs & imaging results that were available  during my care of the patient were reviewed by  me and considered in my medical decision making (see chart for details).    MDM Rules/Calculators/A&P                          This patient presents to the ED for concern of shortness of breath, this involves an extensive number of treatment options, and is a complaint that carries with it a high risk of complications and morbidity.  The differential diagnosis includes pulmonary edema and acute congestive heart failure.  May be related to pneumonia, sepsis, pulmonary embolism though this seems less likely given that she is on Eliquis.  Consider pneumothorax as well   Co morbidities that complicate the patient evaluation  Diabetes, heart failure, atrial fibrillation   Additional history obtained:  Additional history obtained from external medical record External records from outside source obtained and reviewed including echocardiogram showed ejection fraction of 25 to 30%, there was grade 2 diastolic dysfunction as of March 2022   Lab Tests:  I Ordered, and personally interpreted labs.  The pertinent results include:  Elevated trop > 400, BNP > 3000, CBC and CMP appreciated/  chronic renal insufficiency    Imaging Studies ordered:  I ordered imaging studies including portalbe chest xray  I independently visualized and interpreted imaging which showed acute pumonary edema and cardiomegaly I agree with the radiologist interpretation   Cardiac Monitoring:  The patient was maintained on a cardiac monitor.  I personally viewed and interpreted the cardiac monitored which showed an underlying rhythm of: Sinus tachycardia   Medicines ordered and prescription drug management:  I ordered medication including Lasix for diuresis Reevaluation of the patient after these medicines showed that the patient improved I have reviewed the patients home medicines and have made adjustments as needed   Test Considered:  Consider other testing such  as echocardiogram which may need to be performed inpatient, CT scan which is not indicated given that this is unlikely to be pulmonary embolism     Critical Interventions:  Oxygen supplementation for severe hypoxia BiPAP as needed for ongoing hypoxia and pulmonary edema Consultation with cardiology   Consultations Obtained:  I requested consultation with the cardiologist Dr. Toney Rakes who recommends that this patient should be admitted locally to the hospital despite the elevation in troponin as this was likely secondary to stress from heart failure as well as chronic renal failure, I discussed the care with a local hospitalist Dr. Josephine Cables who is also agreeable to admission.  Problem List / ED Course:  Acute congestive heart failure Hypertension Elevated troponin   Reevaluation:  After the interventions noted above, I reevaluated the patient and found that they have :improved   Social Determinants of Health:  None   Dispostion:  After consideration of the diagnostic results and the patients response to treatment, I feel that the patent would benefit from admission to the hospital.       Final Clinical Impression(s) / ED Diagnoses Final diagnoses:  None    Rx / DC Orders ED Discharge Orders     None        Noemi Chapel, MD 12/26/21 1105

## 2021-12-25 NOTE — H&P (Addendum)
History and Physical  Tracey Bryant [id number]:[id number] DOB: 1950/12/29 DOA: 12/25/2021  Referring physician: Noemi Chapel, MD PCP: Coolidge Breeze, [place]  Patient coming from: Home  Chief Complaint: Shortness of breath  HPI: Tracey Bryant is a 70 y.o. female with medical history significant for paroxysmal A. fib, PVD, DVT, T2DM, hypertension, CVA and acute cholecystitis s/p cholecystostomy March 2022 who presented to the emergency department due to 2-day onset of worsening shortness of breath.  Patient states that she was placed on Lasix as needed for leg swelling, but was told to take it only intermittently so as to avoid kidney injury.  Patient states that she has not taken the medication in a week, she started to notice shortness of breath about 2 days ago, so she started to take the Lasix again, but there was no improvement in the shortness of breath.  EMS was activated and on arrival of EMS team, she was noted to have an O2 sat of 80% on room air, supplemental oxygen via [place] at 6 LPM was provided and Improvement in O2 sat to 90%, she was taken to the ED for further evaluation and management.  Patient states that she noted some swelling of her legs, mild increase of abdominal girth, but she denies chest pain, fever, chills, headache.  ED Course:  In the emergency department, she was intermittently tachypneic.  BP was 172/76.  Work-up in the ED showed normocytic anemia, thrombocytosis, hyperglycemia.  BUN/creatinine 36/2.52 (baseline creatinine ranged within 1.8-2.4).  Troponin x1 -negative for, BNP 3291.  Influenza A, B, SARS coronavirus 2 was negative. Chest x-ray showed cardiomegaly with mild interstitial edema and small pleural effusions. She was treated with aspirin, IV Lasix 80 mg x 1 was given and patient was placed on BiPAP.  Cardiology was consulted and recommended admitting patient here at [facility].  Review of Systems: A full 10 point Review of Systems was done, except as stated above, all  other Review of systems were negative.  Past Medical History:  Diagnosis Date   Arthritis    Cardiomyopathy ([place])    Diabetes mellitus without complication ([place])    DVT (deep venous thrombosis) ([place]) 07/26/2021   Hypertension    Paroxysmal atrial fibrillation ([place]) 07/26/2021   Stroke [facility])    Past Surgical History:  Procedure Laterality Date   breast tumor     IR CATHETER TUBE CHANGE  09/25/2021   IR CHOLANGIOGRAM EXISTING TUBE  05/13/2021   IR EXCHANGE BILIARY DRAIN  07/27/2021   IR EXCHANGE BILIARY DRAIN  11/04/2021   IR RADIOLOGIST EVAL & MGMT  09/04/2021   IR REMOVAL OF CALCULI/DEBRIS BILIARY DUCT/GB  09/25/2021   IR REMOVAL OF CALCULI/DEBRIS BILIARY DUCT/GB  11/04/2021   PACEMAKER IMPLANT     TONSILLECTOMY      Social History:  reports that she has never smoked. She has never used smokeless tobacco. She reports that she does not drink alcohol and does not use drugs.   No Known Allergies  Family History  Problem Relation Age of Onset   Cancer Mother    Diabetes Mother    Heart disease Father      Prior to Admission medications   Medication Sig Start Date End Date Taking? Authorizing Provider  acetaminophen (TYLENOL) 500 MG tablet Take 1 tablet (500 mg total) by mouth every 8 (eight) hours as needed for moderate pain. 04/08/21 04/08/22  [name], MD  apixaban (ELIQUIS) 5 MG TABS tablet Take 1 tablet (5 mg total) by mouth  2 (two) times daily. 08/04/21   Pattricia Boss, MD  carvedilol (COREG) 6.25 MG tablet Take 1 tablet (6.25 mg total) by mouth 2 (two) times daily with a meal. 08/01/21   [name], MD  ezetimibe (ZETIA) 10 MG tablet Take 10 mg by mouth daily.    [provider]  furosemide (LASIX) 20 MG tablet Take 1 tablet (20 mg total) by mouth daily. 08/04/21   Pattricia Boss, MD  insulin aspart (NOVOLOG) 100 UNIT/ML injection Substitute to any brand approved.Before each meal 3 times a day, 140-199 - 2 units, 200-250 - 4 units, 251-299 - 6 units,  300-349 -  8 units,  350 or above 10 units. Dispense syringes and needles as needed, Ok to switch to PEN if approved. DX DM2, Code E11.65 04/08/21   [name], MD  insulin glargine (LANTUS) 100 UNIT/ML injection Inject 0.25 mLs (25 Units total) into the skin at bedtime. 04/08/21   [name], MD  isosorbide-hydrALAZINE (BIDIL) 20-37.5 MG tablet Take 1 tablet by mouth 3 (three) times daily. 08/01/21   [name], MD    Physical Exam: BP (!) 120/59    Pulse 61    Temp 98.4 F (36.9 C) (Oral)    Resp 12    Ht 5\' 6"  (1.676 m)    Wt 70.3 kg    SpO2 98%    BMI 25.02 kg/m   General: 70 y.o. year-old female ill appearing but in no acute distress.  Alert and oriented x3. HEENT: NCAT, EOMI Neck: Supple, trachea medial Cardiovascular: Regular rate and rhythm with no rubs or gallops.  No thyromegaly or JVD noted.  No lower extremity edema. 2/4 pulses in all 4 extremities. Respiratory: Diffuse rhonchi and rales on auscultation.   Abdomen: Soft, nontender nondistended with normal bowel sounds x4 quadrants. Muskuloskeletal: Bilateral lower extremity trace edema.  No cyanosis or clubbing noted bilaterally Neuro: CN II-XII intact, sensation, reflexes intact Skin: No ulcerative lesions noted or rashes Psychiatry: Mood is appropriate for condition and setting          Labs on Admission:  Basic Metabolic Panel: Recent Labs  Lab 12/25/21 2110  NA 135  K 4.2  CL 102  CO2 20*  GLUCOSE 276*  BUN 36*  CREATININE 2.52*  CALCIUM 8.6*   Liver Function Tests: Recent Labs  Lab 12/25/21 2110  AST 17  ALT 12  ALKPHOS 73  BILITOT 0.9  PROT 7.4  ALBUMIN 3.1*   No results for input(s): LIPASE, AMYLASE in the last 168 hours. No results for input(s): AMMONIA in the last 168 hours. CBC: Recent Labs  Lab 12/25/21 2110  WBC 10.3  NEUTROABS 9.0*  HGB 11.9*  HCT 37.2  MCV 93.9  PLT 410*   Cardiac Enzymes: No results for input(s): CKTOTAL, CKMB, CKMBINDEX, TROPONINI in the last 168  hours.  BNP (last 3 results) Recent Labs    07/26/21 1601 08/04/21 1350 12/25/21 2110  BNP 1,291.0* 942.4* 3,291.0*    ProBNP (last 3 results) No results for input(s): PROBNP in the last 8760 hours.  CBG: No results for input(s): GLUCAP in the last 168 hours.  Radiological Exams on Admission: DG Chest Port 1 View  Result Date: 12/25/2021 CLINICAL DATA:  Shortness of breath, CHF. EXAM: PORTABLE CHEST 1 VIEW COMPARISON:  Chest x-ray 08/04/2021. FINDINGS: The heart is enlarged, unchanged. 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 There are small bilateral pleural effusions. There are central interstitial opacities bilaterally. There are some patchy opacities in  both lung bases. There is no evidence for pneumothorax or acute fracture. IMPRESSION: 1. Cardiomegaly with mild interstitial edema and small pleural effusions. Electronically Signed   By: Ronney Asters M.D.   On: 12/25/2021 21:32    EKG: I independently viewed the EKG done and my findings are as followed: Normal sinus rhythm at a rate of 81 bpm  Assessment/Plan Present on Admission:  Acute on chronic HFrEF (heart failure with reduced ejection fraction) (HCC)  Paroxysmal atrial fibrillation (HCC)  CVA (cerebral vascular accident) ([place])  DVT (deep venous thrombosis) ([place])  Principal Problem:   Acute on chronic HFrEF (heart failure with reduced ejection fraction) ([place]) Active Problems:   CVA (cerebral vascular accident) ([place])   Paroxysmal atrial fibrillation ([place])   DVT (deep venous thrombosis) (HCC)   Acute respiratory failure with hypoxia (HCC)   Elevated troponin   Elevated brain natriuretic peptide (BNP) level   Hypoalbuminemia due to protein-calorie malnutrition (HCC)   CKD (chronic kidney disease), stage IV (HCC)  Acute respiratory failure with hypoxia secondary to acute on chronic HFrEF Elevated BNP (3291) Continue total input/output, daily weights and fluid restriction Continue IV Lasix 40 twice daily Continue  Cardiac diet  Echocardiogram done on 3/23 showed LV EF of 25 to 30%, LV demonstrates global hypokinesis.  LV diastolic parameters are consistent with grade 2 DD (pseudonormalization).  Echocardiogram will be done in the morning Cardiology at [facility] was consulted and recommended that patient can be admitted here at [facility] per ED physician.   Elevated troponin possibly secondary to type II demand ischemia, rule out NSTEMI Troponin x 1 - 384 >, patient denies chest pain Per ED physician, cardiology was consulted at [facility] and it was recommended that patient can stay here at [facility] Continue to trend troponin  Of Note: Patient's troponin has increased to 588, there was still no complaint about chest pain.  EKG repeated and personally reviewed shows sinus rhythm at a rate of 60 bpm with prolonged QTc at 566ms Cardiology at [facility] (Dr. Toney Rakes) was consulted and he reassured that this is still possible due to type II demand ischemia, he recommended echocardiogram in the morning and to consider reconsulting for worsening findings.  Questionable CAP POA Chest x-ray personally reviewed showed bilateral interstitial opacities and some patchy opacities in both lung bases Patient denies fever, chills, cough, chest congestion Procalcitonin will be checked prior to starting antibiotics  Thrombocytosis possibly reactive Platelets 410, continue to monitor platelet levels  Hypoalbuminemia possibly secondary to mild protein calorie malnutrition Protein supplement to be provided  Essential hypertension Continue Coreg, Lasix  Hyperglycemia secondary to T2DM A1c about 9 months ago was 9.1 Continue ISS and hypoglycemic protocol Continue Semglee 10 units nightly (home dose of Lantus equals 25) and adjust dose accordingly  CKD stage IV BUN/creatinine 36/2.52 (baseline creatinine ranged within 1.8-2.4 Renally adjust medications, avoid nephrotoxic agents/dehydration/hypotension  PAF s/p PPM: Continue Eliquis, Coreg  Left  popliteal DVT Diagnosed in April 2022 Continue Eliquis  Hyperlipidemia Continue Zetia  History of CVAs (April 2022) Continue eliquis  DVT prophylaxis: Eliquis  Code Status: Full code  Family Communication: None at bedside  Disposition Plan:  Patient is from:                        home Anticipated DC to:                   SNF or family members home Anticipated DC date:  2-3 days Anticipated DC barriers:          Patient requires inpatient management due to severity of symptoms    Consults called: None  Admission status: Inpatient    Bernadette Hoit MD [facility]  12/26/2021, 3:00 AM

## 2021-12-25 NOTE — ED Triage Notes (Signed)
Patient arrives via [facility] for shortness of breath.  States has been short of breath since last night.  O2 saturation at 84% on RA.  Maintained O2 saturation of 92 on 6 lpm.

## 2021-12-26 ENCOUNTER — Encounter (HOSPITAL_COMMUNITY): Payer: Self-pay | Admitting: Internal Medicine

## 2021-12-26 ENCOUNTER — Inpatient Hospital Stay (HOSPITAL_COMMUNITY): Payer: Medicare HMO

## 2021-12-26 DIAGNOSIS — I5021 Acute systolic (congestive) heart failure: Secondary | ICD-10-CM

## 2021-12-26 DIAGNOSIS — R7989 Other specified abnormal findings of blood chemistry: Secondary | ICD-10-CM

## 2021-12-26 DIAGNOSIS — R778 Other specified abnormalities of plasma proteins: Secondary | ICD-10-CM | POA: Diagnosis not present

## 2021-12-26 DIAGNOSIS — E8809 Other disorders of plasma-protein metabolism, not elsewhere classified: Secondary | ICD-10-CM

## 2021-12-26 DIAGNOSIS — J9601 Acute respiratory failure with hypoxia: Secondary | ICD-10-CM

## 2021-12-26 DIAGNOSIS — N184 Chronic kidney disease, stage 4 (severe): Secondary | ICD-10-CM

## 2021-12-26 DIAGNOSIS — N179 Acute kidney failure, unspecified: Secondary | ICD-10-CM

## 2021-12-26 DIAGNOSIS — I5023 Acute on chronic systolic (congestive) heart failure: Secondary | ICD-10-CM | POA: Diagnosis not present

## 2021-12-26 DIAGNOSIS — E46 Unspecified protein-calorie malnutrition: Secondary | ICD-10-CM

## 2021-12-26 LAB — COMPREHENSIVE METABOLIC PANEL
ALT: 13 U/L (ref 0–44)
AST: 18 U/L (ref 15–41)
Albumin: 2.7 g/dL — ABNORMAL LOW (ref 3.5–5.0)
Alkaline Phosphatase: 64 U/L (ref 38–126)
Anion gap: 10 (ref 5–15)
BUN: 39 mg/dL — ABNORMAL HIGH (ref 8–23)
CO2: 23 mmol/L (ref 22–32)
Calcium: 8.3 mg/dL — ABNORMAL LOW (ref 8.9–10.3)
Chloride: 104 mmol/L (ref 98–111)
Creatinine, Ser: 2.68 mg/dL — ABNORMAL HIGH (ref 0.44–1.00)
GFR, Estimated: 19 mL/min — ABNORMAL LOW (ref >60)
Glucose, Bld: 225 mg/dL — ABNORMAL HIGH (ref 70–99)
Potassium: 4.5 mmol/L (ref 3.5–5.1)
Sodium: 137 mmol/L (ref 135–145)
Total Bilirubin: 0.7 mg/dL (ref 0.3–1.2)
Total Protein: 6.7 g/dL (ref 6.5–8.1)

## 2021-12-26 LAB — CBC
HCT: 34.2 % — ABNORMAL LOW (ref 36.0–46.0)
Hemoglobin: 10.5 g/dL — ABNORMAL LOW (ref 12.0–15.0)
MCH: 29.1 pg (ref 26.0–34.0)
MCHC: 30.7 g/dL (ref 30.0–36.0)
MCV: 94.7 fL (ref 80.0–100.0)
Platelets: 341 10*3/uL (ref 150–400)
RBC: 3.61 MIL/uL — ABNORMAL LOW (ref 3.87–5.11)
RDW: 15.8 % — ABNORMAL HIGH (ref 11.5–15.5)
WBC: 7 10*3/uL (ref 4.0–10.5)
nRBC: 0 % (ref 0.0–0.2)

## 2021-12-26 LAB — ECHOCARDIOGRAM COMPLETE
Area-P 1/2: 5.27 cm2
Calc EF: 39.9 %
Height: 66 in
S' Lateral: 3.1 cm
Single Plane A2C EF: 38.6 %
Single Plane A4C EF: 41.9 %
Weight: 2670.21 oz

## 2021-12-26 LAB — PHOSPHORUS: Phosphorus: 4.5 mg/dL (ref 2.5–4.6)

## 2021-12-26 LAB — TROPONIN I (HIGH SENSITIVITY)
Troponin I (High Sensitivity): 588 ng/L (ref <18)
Troponin I (High Sensitivity): 562 ng/L (ref <18)

## 2021-12-26 LAB — MAGNESIUM: Magnesium: 2 mg/dL (ref 1.7–2.4)

## 2021-12-26 LAB — PROCALCITONIN: Procalcitonin: 0.84 ng/mL

## 2021-12-26 LAB — MRSA NEXT GEN BY PCR, NASAL: MRSA by PCR Next Gen: NOT DETECTED

## 2021-12-26 MED ORDER — INSULIN GLARGINE-YFGN 100 UNIT/ML ~~LOC~~ SOLN
10.0000 [IU] | Freq: Every day | SUBCUTANEOUS | Status: DC
  Administered 2021-12-26 – 2021-12-29 (×4): 10 [IU] via SUBCUTANEOUS
  Filled 2021-12-26 (×5): qty 0.1

## 2021-12-26 MED ORDER — EZETIMIBE 10 MG PO TABS
10.0000 mg | ORAL_TABLET | Freq: Every day | ORAL | Status: DC
  Administered 2021-12-26 – 2021-12-30 (×5): 10 mg via ORAL
  Filled 2021-12-26 (×5): qty 1

## 2021-12-26 MED ORDER — FUROSEMIDE 10 MG/ML IJ SOLN
40.0000 mg | Freq: Two times a day (BID) | INTRAMUSCULAR | Status: DC
  Administered 2021-12-26 – 2021-12-28 (×5): 40 mg via INTRAVENOUS
  Filled 2021-12-26 (×5): qty 4

## 2021-12-26 MED ORDER — CARVEDILOL 3.125 MG PO TABS
6.2500 mg | ORAL_TABLET | Freq: Two times a day (BID) | ORAL | Status: DC
  Administered 2021-12-26 – 2021-12-30 (×9): 6.25 mg via ORAL
  Filled 2021-12-26 (×10): qty 2

## 2021-12-26 MED ORDER — CHLORHEXIDINE GLUCONATE CLOTH 2 % EX PADS
6.0000 | MEDICATED_PAD | Freq: Every day | CUTANEOUS | Status: DC
  Administered 2021-12-26 – 2021-12-30 (×5): 6 via TOPICAL

## 2021-12-26 MED ORDER — GLUCERNA SHAKE PO LIQD
237.0000 mL | Freq: Three times a day (TID) | ORAL | Status: DC
  Administered 2021-12-26 – 2021-12-30 (×11): 237 mL via ORAL
  Filled 2021-12-26 (×8): qty 237

## 2021-12-26 MED ORDER — FUROSEMIDE 10 MG/ML IJ SOLN
40.0000 mg | Freq: Two times a day (BID) | INTRAMUSCULAR | Status: DC
Start: 2021-12-26 — End: 2021-12-26

## 2021-12-26 MED ORDER — APIXABAN 5 MG PO TABS
5.0000 mg | ORAL_TABLET | Freq: Two times a day (BID) | ORAL | Status: DC
  Administered 2021-12-26 – 2021-12-30 (×9): 5 mg via ORAL
  Filled 2021-12-26 (×9): qty 1

## 2021-12-26 NOTE — Progress Notes (Signed)
°  Echocardiogram 2D Echocardiogram has been performed.  Tracey Bryant 12/26/2021, 2:04 PM

## 2021-12-26 NOTE — Plan of Care (Signed)
°  Problem: Acute Rehab PT Goals(only PT should resolve) Goal: Pt Will Go Supine/Side To Sit Outcome: Progressing Flowsheets (Taken 12/26/2021 1245) Pt will go Supine/Side to Sit: with modified independence Goal: Patient Will Transfer Sit To/From Stand Outcome: Progressing Flowsheets (Taken 12/26/2021 1245) Patient will transfer sit to/from stand: with modified independence Goal: Pt Will Transfer Bed To Chair/Chair To Bed Outcome: Progressing Flowsheets (Taken 12/26/2021 1245) Pt will Transfer Bed to Chair/Chair to Bed: with modified independence Goal: Pt Will Ambulate Outcome: Progressing Flowsheets (Taken 12/26/2021 1245) Pt will Ambulate:  50 feet  with supervision  with rolling walker   12:46 PM, 12/26/21 Tracey Bryant, MPT Physical Therapist with [facility] 336 [phone] office [phone] mobile phone

## 2021-12-26 NOTE — Progress Notes (Signed)
PROGRESS NOTE  Tracey Bryant [id number] DOB: Jul 25, 1951 DOA: 12/25/2021 PCP: Coolidge Breeze, FNP  Brief History:  70 year old female with a history of diabetes mellitus type 2, systolic CHF with EF 76-81%, hypertension, stroke, hyperlipidemia, paroxysmal atrial fibrillation presenting with 3-day history of shortness of breath and worsening lower extremity edema.   She was also complaining of increasing abdominal girth.  She states that she has not taken her furosemide for several weeks.  She states that she has only been taking it on a as needed basis.  She denies any fevers, chills, headache, neck pain, chest pain, nausea, vomiting, diarrhea, abdominal pain.  There is no dysuria or hematuria. In the emergency department, the patient was noted to be hypoxic with oxygen saturation in the 80s.  She was placed on 6 L and subsequently BiPAP.  She was started on IV furosemide.  Chest x-ray showed increased interstitial edema and bilateral pleural effusions.  She was admitted for CHF work-up and treatment.  Assessment/Plan: Acute respiratory failure with hypoxia -Wean off BiPAP as tolerated -Weaned to room air for saturation greater 92% -Secondary to CHF/pulmonary edema  Acute on chronic systolic CHF -1/57/2620-BTDH EF of 25 to 30%, grade 2 DD -Continue furosemide 40 mg IV twice daily -May need to increase dose of furosemide secondary to her CKD -Daily weights -Accurate I's and O's -Repeat echo -Continue carvedilol  Elevated troponin -Secondary to demand ischemia -No chest pain presently -Troponin 384>> 588>> 562 -Monitor clinically  CKD stage IV -Baseline creatinine has been widely variable but appears to have been ranging 1.8-2.3 -May need higher dosing of furosemide -We will need to tolerate elevated serum creatinine for improved euvolemia  Paroxysmal atrial fibrillation -Presently in sinus rhythm -Continue apixaban  Diabetes mellitus type 2, uncontrolled  hyperglycemia -Continue reduced dose Semglee -NovoLog sliding scale -Hemoglobin A1c  Hyperlipidemia -Continue Zetia               Family Communication:   no Family at bedside  Consultants:  none  Code Status:  FULL  DVT Prophylaxis: apixaban   Procedures: As Listed in Progress Note Above  Antibiotics: None        Subjective:  Patient states that she is breathing better but remains short of breath.  She denies any chest pain, fevers, chills, headache, neck pain, nausea, vomiting  Abdominal pain. Objective: Vitals:   12/26/21 0530 12/26/21 0630 12/26/21 0700 12/26/21 0757  BP: (!) 141/72 131/67 132/64   Pulse: 60 (!) 59 60   Resp: 16 13 10    Temp:      TempSrc:      SpO2: 97% 97% 97%   Weight:      Height:    5\' 6"  (1.676 m)    Intake/Output Summary (Last 24 hours) at 12/26/2021 0805 Last data filed at 12/26/2021 0029 Gross per 24 hour  Intake 123 ml  Output --  Net 123 ml   Weight change:  Exam:  General:  Pt is alert, follows commands appropriately, not in acute distress HEENT: No icterus, No thrush, No neck mass, [place]/AT Cardiovascular: RRR, S1/S2, no rubs, no gallops Respiratory: Bilateral crackles.  No wheezing Abdomen: Soft/+BS, non tender, non distended, no guarding Extremities: 1 + LE edema, No lymphangitis, No petechiae, No rashes, no synovitis   Data Reviewed: I have personally reviewed following labs and imaging studies Basic Metabolic Panel: Recent Labs  Lab 12/25/21 2110 12/26/21 0336  NA 135 137  K  4.2 4.5  CL 102 104  CO2 20* 23  GLUCOSE 276* 225*  BUN 36* 39*  CREATININE 2.52* 2.68*  CALCIUM 8.6* 8.3*  MG  --  2.0  PHOS  --  4.5   Liver Function Tests: Recent Labs  Lab 12/25/21 2110 12/26/21 0336  AST 17 18  ALT 12 13  ALKPHOS 73 64  BILITOT 0.9 0.7  PROT 7.4 6.7  ALBUMIN 3.1* 2.7*   No results for input(s): LIPASE, AMYLASE in the last 168 hours. No results for input(s): AMMONIA in the last 168  hours. Coagulation Profile: No results for input(s): INR, PROTIME in the last 168 hours. CBC: Recent Labs  Lab 12/25/21 2110 12/26/21 0336  WBC 10.3 7.0  NEUTROABS 9.0*  --   HGB 11.9* 10.5*  HCT 37.2 34.2*  MCV 93.9 94.7  PLT 410* 341   Cardiac Enzymes: No results for input(s): CKTOTAL, CKMB, CKMBINDEX, TROPONINI in the last 168 hours. BNP: Invalid input(s): POCBNP CBG: No results for input(s): GLUCAP in the last 168 hours. HbA1C: No results for input(s): HGBA1C in the last 72 hours. Urine analysis:    Component Value Date/Time   COLORURINE AMBER (A) 03/17/2021 1208   APPEARANCEUR CLOUDY (A) 03/17/2021 1208   LABSPEC 1.015 03/17/2021 1208   PHURINE 5.0 03/17/2021 1208   GLUCOSEU 50 (A) 03/17/2021 1208   HGBUR SMALL (A) 03/17/2021 1208   BILIRUBINUR NEGATIVE 03/17/2021 [place] 03/17/2021 1208   PROTEINUR >=300 (A) 03/17/2021 1208   UROBILINOGEN 0.2 01/17/2013 1910   NITRITE NEGATIVE 03/17/2021 1208   LEUKOCYTESUR LARGE (A) 03/17/2021 1208   Sepsis Labs: @LABRCNTIP (procalcitonin:4,lacticidven:4) ) Recent Results (from the past 240 hour(s))  Resp Panel by RT-PCR (Flu A&B, Covid) Nasopharyngeal Swab     Status: None   Collection Time: 12/25/21  8:46 PM   Specimen: Nasopharyngeal Swab; Nasopharyngeal(NP) swabs in vial transport medium  Result Value Ref Range Status   SARS Coronavirus 2 by RT PCR NEGATIVE NEGATIVE Final    Comment: (NOTE) SARS-CoV-2 target nucleic acids are NOT DETECTED.  The SARS-CoV-2 RNA is generally detectable in upper respiratory specimens during the acute phase of infection. The lowest concentration of SARS-CoV-2 viral copies this assay can detect is 138 copies/mL. A negative result does not preclude SARS-Cov-2 infection and should not be used as the sole basis for treatment or other patient management decisions. A negative result may occur with  improper specimen collection/handling, submission of specimen other than  nasopharyngeal swab, presence of viral mutation(s) within the areas targeted by this assay, and inadequate number of viral copies(<138 copies/mL). A negative result must be combined with clinical observations, patient history, and epidemiological information. The expected result is Negative.  Fact Sheet for Patients:  EntrepreneurPulse.com.au  Fact Sheet for Healthcare Providers:  IncredibleEmployment.be  This test is no t yet approved or cleared by the [place] FDA and  has been authorized for detection and/or diagnosis of SARS-CoV-2 by FDA under an Emergency Use Authorization (EUA). This EUA will remain  in effect (meaning this test can be used) for the duration of the COVID-19 declaration under Section 564(b)(1) of the Act, 21 U.S.C.section 360bbb-3(b)(1), unless the authorization is terminated  or revoked sooner.       Influenza A by PCR NEGATIVE NEGATIVE Final   Influenza B by PCR NEGATIVE NEGATIVE Final    Comment: (NOTE) The Xpert Xpress SARS-CoV-2/FLU/RSV plus assay is intended as an aid in the diagnosis of influenza from Nasopharyngeal swab specimens and should not  be used as a sole basis for treatment. Nasal washings and aspirates are unacceptable for Xpert Xpress SARS-CoV-2/FLU/RSV testing.  Fact Sheet for Patients: EntrepreneurPulse.com.au  Fact Sheet for Healthcare Providers: IncredibleEmployment.be  This test is not yet approved or cleared by the [place] FDA and has been authorized for detection and/or diagnosis of SARS-CoV-2 by FDA under an Emergency Use Authorization (EUA). This EUA will remain in effect (meaning this test can be used) for the duration of the COVID-19 declaration under Section 564(b)(1) of the Act, 21 U.S.C. section 360bbb-3(b)(1), unless the authorization is terminated or revoked.  Performed at [facility], [address], [place], [place] [address]       Scheduled Meds:  apixaban  5 mg Oral BID   carvedilol  6.25 mg Oral BID WC   ezetimibe  10 mg Oral Daily   feeding supplement (GLUCERNA SHAKE)  237 mL Oral TID BM   furosemide  40 mg Intravenous Q12H   insulin glargine-yfgn  10 Units Subcutaneous QHS   sodium chloride flush  3 mL Intravenous Q12H   Continuous Infusions:  sodium chloride      Procedures/Studies: DG Chest Port 1 View  Result Date: 12/25/2021 CLINICAL DATA:  Shortness of breath, CHF. EXAM: PORTABLE CHEST 1 VIEW COMPARISON:  Chest x-ray 08/04/2021. FINDINGS: The heart is enlarged, unchanged. 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 There are small bilateral pleural effusions. There are central interstitial opacities bilaterally. There are some patchy opacities in both lung bases. There is no evidence for pneumothorax or acute fracture. IMPRESSION: 1. Cardiomegaly with mild interstitial edema and small pleural effusions. Electronically Signed   By: Ronney Asters M.D.   On: 12/25/2021 21:32    Orson Eva, DO  [facility]  If 7PM-7AM, please contact night-coverage [email or web address] Password [email or web address] 12/26/2021, 8:05 AM   LOS: 1 day

## 2021-12-26 NOTE — Progress Notes (Signed)
RT will placed back on BIPAP after pt has had her PM meds. Coordinated with RN.

## 2021-12-26 NOTE — Evaluation (Signed)
Physical Therapy Evaluation Patient Details Name: Tracey Bryant MRN: [id number] DOB: 06/20/1951 Today's Date: 12/26/2021  History of Present Illness  Tracey Bryant is a 70 y.o. female with medical history significant for paroxysmal A. fib, PVD, DVT, T2DM, hypertension, CVA and acute cholecystitis s/p cholecystostomy March 2022 who presented to the emergency department due to 2-day onset of worsening shortness of breath.  Patient states that she was placed on Lasix as needed for leg swelling, but was told to take it only intermittently so as to avoid kidney injury.  Patient states that she has not taken the medication in a week, she started to notice shortness of breath about 2 days ago, so she started to take the Lasix again, but there was no improvement in the shortness of breath.  EMS was activated and on arrival of EMS team, she was noted to have an O2 sat of 80% on room air, supplemental oxygen via [place] at 6 LPM was provided and  Improvement in O2 sat to 90%, she was taken to the ED for further evaluation and management.  Patient states that she noted some swelling of her legs, mild increase of abdominal girth, but she denies chest pain, fever, chills, headache.   Clinical Impression  Patient demonstrates slightly labored movement for sitting up at bedside, initially unsteady on feet when taking steps in room and able to transfer to commode to have a bowel movement, demonstrated improvement in balance after using commode and able to ambulate safely in room without loss of balance.  Patient desaturated from 95% to 87% on room air and able to maintain SpO2 at 95% on 2 LPM - RN notified.  Patient tolerated sitting up in chair to eat lunch after therapy.  Patient will benefit from continued skilled physical therapy in hospital and recommended venue below to increase strength, balance, endurance for safe ADLs and gait.          Recommendations for follow up therapy are one component of a  multi-disciplinary discharge planning process, led by the attending physician.  Recommendations may be updated based on patient status, additional functional criteria and insurance authorization.  Follow Up Recommendations Home health PT    Assistance Recommended at Discharge PRN  Functional Status Assessment Patient has had a recent decline in their functional status and demonstrates the ability to make significant improvements in function in a reasonable and predictable amount of time.  Equipment Recommendations  None recommended by PT    Recommendations for Other Services       Precautions / Restrictions Precautions Precautions: Fall Restrictions Weight Bearing Restrictions: No      Mobility  Bed Mobility Overal bed mobility: Needs Assistance Bed Mobility: Supine to Sit     Supine to sit: Supervision     General bed mobility comments: increased time, slightly labored movement    Transfers Overall transfer level: Needs assistance Equipment used: Rolling walker (2 wheels) Transfers: Sit to/from Stand;Bed to chair/wheelchair/BSC Sit to Stand: Supervision;Min guard   Step pivot transfers: Min guard       General transfer comment: slow labored movement, mild diffiuclty transferring to/from commode    Ambulation/Gait Ambulation/Gait assistance: Min guard Gait Distance (Feet): 12 Feet Assistive device: Rolling walker (2 wheels) Gait Pattern/deviations: Decreased step length - right;Decreased step length - left;Decreased stride length Gait velocity: decreased     General Gait Details: demonstrates slightly labored cadence without loss of balance and limited mostly due to fatigue  Stairs  Wheelchair Mobility    Modified Rankin (Stroke Patients Only)       Balance Overall balance assessment: Needs assistance Sitting-balance support: Feet supported;No upper extremity supported Sitting balance-Leahy Scale: Good Sitting balance - Comments:  seated at EOB   Standing balance support: During functional activity;Bilateral upper extremity supported Standing balance-Leahy Scale: Fair Standing balance comment: fair/good using RW                             Pertinent Vitals/Pain Pain Assessment: No/denies pain    Home Living Family/patient expects to be discharged to:: Private residence Living Arrangements: Alone Available Help at Discharge: Family;Available 24 hours/day (can stay with her daughter) Type of Home: House Home Access: Stairs to enter Entrance Stairs-Rails: Can reach both;Right;Left Entrance Stairs-Number of Steps: 3   Home Layout: One level Home Equipment: Rollator (4 wheels);Rolling Walker (2 wheels);Shower seat;BSC/3in1;Grab bars - tub/shower Additional Comments: patient can stay with her daughter if needed    Prior Function Prior Level of Function : Independent/Modified Independent             Mobility Comments: household ambulator leaning on walls, furniture, uses rollator for short community distances ADLs Comments: assisted by family as needed     Hand Dominance   Dominant Hand: Right    Extremity/Trunk Assessment   Upper Extremity Assessment Upper Extremity Assessment: Overall WFL for tasks assessed    Lower Extremity Assessment Lower Extremity Assessment: Generalized weakness    Cervical / Trunk Assessment Cervical / Trunk Assessment: Normal  Communication   Communication: No difficulties  Cognition Arousal/Alertness: Awake/alert Behavior During Therapy: WFL for tasks assessed/performed Overall Cognitive Status: Within Functional Limits for tasks assessed                                          General Comments      Exercises     Assessment/Plan    PT Assessment Patient needs continued PT services  PT Problem List Decreased strength;Decreased activity tolerance;Decreased balance;Decreased mobility       PT Treatment Interventions DME  instruction;Gait training;Stair training;Functional mobility training;Therapeutic activities;Therapeutic exercise;Balance training    PT Goals (Current goals can be found in the Care Plan section)  Acute Rehab PT Goals Patient Stated Goal: return home with family to assist PT Goal Formulation: With patient Time For Goal Achievement: 12/30/21 Potential to Achieve Goals: Good    Frequency Min 3X/week   Barriers to discharge        Co-evaluation               AM-PAC PT "6 Clicks" Mobility  Outcome Measure Help needed turning from your back to your side while in a flat bed without using bedrails?: None Help needed moving from lying on your back to sitting on the side of a flat bed without using bedrails?: A Little Help needed moving to and from a bed to a chair (including a wheelchair)?: A Little Help needed standing up from a chair using your arms (e.g., wheelchair or bedside chair)?: A Little Help needed to walk in hospital room?: A Little Help needed climbing 3-5 steps with a railing? : A Lot 6 Click Score: 18    End of Session Equipment Utilized During Treatment: Oxygen Activity Tolerance: Patient tolerated treatment well;Patient limited by fatigue Patient left: in chair;with call bell/phone within reach Nurse Communication:  Mobility status PT Visit Diagnosis: Unsteadiness on feet (R26.81);Other abnormalities of gait and mobility (R26.89);Muscle weakness (generalized) (M62.81)    Time: 6606-0045 PT Time Calculation (min) (ACUTE ONLY): 30 min   Charges:   PT Evaluation $PT Eval Moderate Complexity: 1 Mod PT Treatments $Therapeutic Activity: 23-37 mins        12:44 PM, 12/26/21 Lonell Grandchild, MPT Physical Therapist with [facility] [phone] office [phone] mobile phone

## 2021-12-27 DIAGNOSIS — N184 Chronic kidney disease, stage 4 (severe): Secondary | ICD-10-CM | POA: Diagnosis not present

## 2021-12-27 DIAGNOSIS — J9601 Acute respiratory failure with hypoxia: Secondary | ICD-10-CM | POA: Diagnosis not present

## 2021-12-27 DIAGNOSIS — R778 Other specified abnormalities of plasma proteins: Secondary | ICD-10-CM | POA: Diagnosis not present

## 2021-12-27 DIAGNOSIS — I5023 Acute on chronic systolic (congestive) heart failure: Secondary | ICD-10-CM | POA: Diagnosis not present

## 2021-12-27 LAB — BASIC METABOLIC PANEL
Anion gap: 7 (ref 5–15)
BUN: 55 mg/dL — ABNORMAL HIGH (ref 8–23)
CO2: 26 mmol/L (ref 22–32)
Calcium: 8.1 mg/dL — ABNORMAL LOW (ref 8.9–10.3)
Chloride: 103 mmol/L (ref 98–111)
Creatinine, Ser: 2.36 mg/dL — ABNORMAL HIGH (ref 0.44–1.00)
GFR, Estimated: 22 mL/min — ABNORMAL LOW (ref >60)
Glucose, Bld: 117 mg/dL — ABNORMAL HIGH (ref 70–99)
Potassium: 3.4 mmol/L — ABNORMAL LOW (ref 3.5–5.1)
Sodium: 136 mmol/L (ref 135–145)

## 2021-12-27 LAB — GLUCOSE, CAPILLARY: Glucose-Capillary: 276 mg/dL — ABNORMAL HIGH (ref 70–99)

## 2021-12-27 LAB — MAGNESIUM: Magnesium: 2 mg/dL (ref 1.7–2.4)

## 2021-12-27 NOTE — Progress Notes (Signed)
Patient off BIPAP and on 3L nasal cannula tolerating well at this time.  RT will continue to monitor.

## 2021-12-27 NOTE — TOC Initial Note (Signed)
Transition of Care [facility]) - Initial/Assessment Note    Patient Details  Name: Tracey Bryant MRN: [id number] Date of Birth: 01/07/1951  Transition of Care [facility]) CM/SW Contact:    Boneta Lucks, RN Phone Number: 12/27/2021, 2:34 PM  Clinical Narrative:      Patient admitted with acute on chronic heart failure. Patient lives home alone. States her son and daughter lives next door. Her son takes her to every appointment. "  I do not miss any." She uses a rollator to get around. TOC discussed home health PT as recommended by PT. Patient refused, she states her children help her enough and she will leave it like that.           Expected Discharge Plan: Home/Self Care Barriers to Discharge: Continued Medical Work up   Patient Goals and CMS Choice Patient states their goals for this hospitalization and ongoing recovery are:: to go home. CMS Medicare.gov Compare Post Acute Care list provided to:: Patient Choice offered to / list presented to : Patient  Expected Discharge Plan and Services Expected Discharge Plan: Home/Self Care       Living arrangements for the past 2 months: Single Family Home                    Prior Living Arrangements/Services Living arrangements for the past 2 months: Single Family Home Lives with:: Self           Current home services: DME   Activities of Daily Living Home Assistive Devices/Equipment: Eyeglasses, Other (Comment) (Rollator) ADL Screening (condition at time of admission) Patient's cognitive ability adequate to safely complete daily activities?: Yes Is the patient deaf or have difficulty hearing?: No Does the patient have difficulty seeing, even when wearing glasses/contacts?: No Does the patient have difficulty concentrating, remembering, or making decisions?: No Patient able to express need for assistance with ADLs?: Yes Does the patient have difficulty dressing or bathing?: No Independently performs ADLs?: Yes (appropriate for  developmental age) Does the patient have difficulty walking or climbing stairs?: No Weakness of Legs: Both Weakness of Arms/Hands: Both  Permission Sought/Granted     Emotional Assessment      Orientation: : Oriented to Self, Oriented to Place, Oriented to  Time, Oriented to Situation Alcohol / Substance Use: Not Applicable Psych Involvement: No (comment)  Admission diagnosis:  Acute pulmonary edema (HCC) [J81.0] Elevated troponin [R77.8] Acute exacerbation of CHF (congestive heart failure) ([place]) [I50.9] Acute respiratory failure with hypoxemia ([place]) [J96.01] Patient Active Problem List   Diagnosis Date Noted   Acute respiratory failure with hypoxia ([place]) 12/26/2021   Elevated troponin 12/26/2021   Elevated brain natriuretic peptide (BNP) level 12/26/2021   Hypoalbuminemia due to protein-calorie malnutrition ([place]) 12/26/2021   CKD (chronic kidney disease), stage IV ([place]) 12/26/2021   Acute exacerbation of CHF (congestive heart failure) ([place]) 12/25/2021   Acute on chronic HFrEF (heart failure with reduced ejection fraction) ([place]) 07/27/2021   Acute on chronic combined systolic and diastolic congestive heart failure ([place]) 07/26/2021   Paroxysmal atrial fibrillation ([place]) 07/26/2021   DVT (deep venous thrombosis) ([place]) 07/26/2021   Normocytic anemia 07/26/2021   Cholecystostomy tube dysfunction 07/26/2021   Moderate protein-calorie malnutrition ([place]) 07/26/2021   Posterior cerebral artery embolism, right    Severe sepsis ([place]) 03/19/2021   Acute cholecystitis 03/19/2021   Acute lower UTI 03/19/2021   Cholelithiasis without cholangitis 03/18/2021   Uncontrolled type 2 diabetes mellitus with hyperglycemia, with long-term current use of insulin ([place]) 03/18/2021  DKA (diabetic ketoacidosis) ([place]) 03/16/2021   AKI (acute kidney injury) ([place]) 03/16/2021   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   Acute posthemorrhagic anemia 03/16/2021   Epistaxis 03/16/2021    Chronic anticoagulation 03/16/2021   CVA (cerebral vascular accident) ([place]) 03/16/2021   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 [place] [address] Phone: [phone] Fax: [phone]   Readmission Risk Interventions Readmission Risk Prevention Plan 12/27/2021 03/19/2021 03/17/2021  Transportation Screening Complete - Complete  PCP or Specialist Appt within 5-7 Days - Complete -  PCP or Specialist Appt within 3-5 Days Complete - -  Home Care Screening - Complete -  Medication Review (RN CM) - - Complete  HRI or Home Care Consult Complete - -  Social Work Consult for [place] Planning/Counseling Complete - -  Palliative Care Screening Not Applicable - -  Medication Review (RN Care Manager) Complete - -  Some recent data might be hidden

## 2021-12-27 NOTE — Progress Notes (Addendum)
PROGRESS NOTE  Tracey Bryant ZOX:[id number] DOB: [birth year] DOA: 12/25/2021 PCP: Coolidge Breeze, FNP  Brief History:  71 year old female with a history of diabetes mellitus type 2, systolic CHF with EF 81-19%, hypertension, stroke, hyperlipidemia, paroxysmal atrial fibrillation presenting with 3-day history of shortness of breath and worsening lower extremity edema.   She was also complaining of increasing abdominal girth.  She states that she has not taken her furosemide for several weeks.  She states that she has only been taking it on a as needed basis.  She denies any fevers, chills, headache, neck pain, chest pain, nausea, vomiting, diarrhea, abdominal pain.  There is no dysuria or hematuria. In the emergency department, the patient was noted to be hypoxic with oxygen saturation in the 80s.  She was placed on 6 L and subsequently BiPAP.  She was started on IV furosemide.  Chest x-ray showed increased interstitial edema and bilateral pleural effusions.  She was admitted for CHF work-up and treatment.   Assessment/Plan: Acute respiratory failure with hypoxia -Wean off BiPAP as tolerated -now on 3L -Weaned to room air for saturation greater 92% -Secondary to CHF/pulmonary edema   Acute on chronic systolic CHF -1/47/8295-AOZH EF of 25 to 30%, grade 2 DD -Continue furosemide 40 mg IV twice daily -May need to increase dose of furosemide secondary to her CKD -Daily weights -Accurate I's and O's -12/31 echo--EF 40-45%  , global HK, RVSP 45.8.  trivial MR/TR      -Continue carvedilol   Elevated troponin -Secondary to demand ischemia -No chest pain presently -Troponin 384>> 588>> 562 -Monitor clinically   CKD stage IV -Baseline creatinine has been widely variable but appears to have been ranging 1.8-2.3 -May need higher dosing of furosemide -We will need to tolerate elevated serum creatinine for improved euvolemia   Paroxysmal atrial fibrillation -Presently in sinus  rhythm -Continue apixaban   Diabetes mellitus type 2, uncontrolled hyperglycemia -Continue reduced dose Semglee -NovoLog sliding scale -Hemoglobin A1c   Hyperlipidemia -Continue Zetia   Hypokalemia -replete   Subjective: Patient states that she is breathing better than yesterday but she still has exertional dyspnea and conversational dyspnea.  Objective: Vitals:   12/27/21 0900 12/27/21 1000 12/27/21 1100 12/27/21 1159  BP: (!) 129/46 (!) 129/53 (!) 142/52   Pulse: 60 60 60   Resp: 18 16 12    Temp: 98.7 F (37.1 C)   97.9 F (36.6 C)  TempSrc: Oral   Oral  SpO2: 98% 98% 97%   Weight:      Height:        Intake/Output Summary (Last 24 hours) at 12/27/2021 1246 Last data filed at 12/27/2021 1200 Gross per 24 hour  Intake 370 ml  Output 3650 ml  Net -3280 ml   Weight change: 5.392 kg Exam:  General:  Pt is alert, follows commands appropriately, not in acute distress HEENT: No icterus, No thrush, No neck mass, [place]/AT Cardiovascular: RRR, S1/S2, no rubs, no gallops Respiratory: bibasilar crackles. No wheeze Abdomen: Soft/+BS, non tender, non distended, no guarding Extremities: trace LE edema, No lymphangitis, No petechiae, No rashes, no synovitis   Data Reviewed: I have personally reviewed following labs and imaging studies Basic Metabolic Panel: Recent Labs  Lab 12/25/21 2110 12/26/21 0336 12/27/21 0434  NA 135 137 136  K 4.2 4.5 3.4*  CL 102 104 103  CO2 20* 23 26  GLUCOSE 276* 225* 117*  BUN 36* 39* 55*  CREATININE 2.52*  2.68* 2.36*  CALCIUM 8.6* 8.3* 8.1*  MG  --  2.0 2.0  PHOS  --  4.5  --    Liver Function Tests: Recent Labs  Lab 12/25/21 2110 12/26/21 0336  AST 17 18  ALT 12 13  ALKPHOS 73 64  BILITOT 0.9 0.7  PROT 7.4 6.7  ALBUMIN 3.1* 2.7*   No results for input(s): LIPASE, AMYLASE in the last 168 hours. No results for input(s): AMMONIA in the last 168 hours. Coagulation Profile: No results for input(s): INR, PROTIME in the last 168  hours. CBC: Recent Labs  Lab 12/25/21 2110 12/26/21 0336  WBC 10.3 7.0  NEUTROABS 9.0*  --   HGB 11.9* 10.5*  HCT 37.2 34.2*  MCV 93.9 94.7  PLT 410* 341   Cardiac Enzymes: No results for input(s): CKTOTAL, CKMB, CKMBINDEX, TROPONINI in the last 168 hours. BNP: Invalid input(s): POCBNP CBG: No results for input(s): GLUCAP in the last 168 hours. HbA1C: No results for input(s): HGBA1C in the last 72 hours. Urine analysis:    Component Value Date/Time   COLORURINE AMBER (A) 03/17/2021 1208   APPEARANCEUR CLOUDY (A) 03/17/2021 1208   LABSPEC 1.015 03/17/2021 1208   PHURINE 5.0 03/17/2021 1208   GLUCOSEU 50 (A) 03/17/2021 1208   HGBUR SMALL (A) 03/17/2021 1208   BILIRUBINUR NEGATIVE 03/17/2021 [place] 03/17/2021 1208   PROTEINUR >=300 (A) 03/17/2021 1208   UROBILINOGEN 0.2 01/17/2013 1910   NITRITE NEGATIVE 03/17/2021 1208   LEUKOCYTESUR LARGE (A) 03/17/2021 1208   Sepsis Labs: @LABRCNTIP (procalcitonin:4,lacticidven:4) ) Recent Results (from the past 240 hour(s))  Resp Panel by RT-PCR (Flu A&B, Covid) Nasopharyngeal Swab     Status: None   Collection Time: 12/25/21  8:46 PM   Specimen: Nasopharyngeal Swab; Nasopharyngeal(NP) swabs in vial transport medium  Result Value Ref Range Status   SARS Coronavirus 2 by RT PCR NEGATIVE NEGATIVE Final    Comment: (NOTE) SARS-CoV-2 target nucleic acids are NOT DETECTED.  The SARS-CoV-2 RNA is generally detectable in upper respiratory specimens during the acute phase of infection. The lowest concentration of SARS-CoV-2 viral copies this assay can detect is 138 copies/mL. A negative result does not preclude SARS-Cov-2 infection and should not be used as the sole basis for treatment or other patient management decisions. A negative result may occur with  improper specimen collection/handling, submission of specimen other than nasopharyngeal swab, presence of viral mutation(s) within the areas targeted by this  assay, and inadequate number of viral copies(<138 copies/mL). A negative result must be combined with clinical observations, patient history, and epidemiological information. The expected result is Negative.  Fact Sheet for Patients:  EntrepreneurPulse.com.au  Fact Sheet for Healthcare Providers:  IncredibleEmployment.be  This test is no t yet approved or cleared by the [place] FDA and  has been authorized for detection and/or diagnosis of SARS-CoV-2 by FDA under an Emergency Use Authorization (EUA). This EUA will remain  in effect (meaning this test can be used) for the duration of the COVID-19 declaration under Section 564(b)(1) of the Act, 21 U.S.C.section 360bbb-3(b)(1), unless the authorization is terminated  or revoked sooner.       Influenza A by PCR NEGATIVE NEGATIVE Final   Influenza B by PCR NEGATIVE NEGATIVE Final    Comment: (NOTE) The Xpert Xpress SARS-CoV-2/FLU/RSV plus assay is intended as an aid in the diagnosis of influenza from Nasopharyngeal swab specimens and should not be used as a sole basis for treatment. Nasal washings and aspirates are unacceptable for  Xpert Xpress SARS-CoV-2/FLU/RSV testing.  Fact Sheet for Patients: EntrepreneurPulse.com.au  Fact Sheet for Healthcare Providers: IncredibleEmployment.be  This test is not yet approved or cleared by the [place] FDA and has been authorized for detection and/or diagnosis of SARS-CoV-2 by FDA under an Emergency Use Authorization (EUA). This EUA will remain in effect (meaning this test can be used) for the duration of the COVID-19 declaration under Section 564(b)(1) of the Act, 21 U.S.C. section 360bbb-3(b)(1), unless the authorization is terminated or revoked.  Performed at [facility], [address] [place], [place] [address]   MRSA Next Gen by PCR, Nasal     Status: None   Collection Time: 12/26/21  8:07 AM    Specimen: Nasal Mucosa; Nasal Swab  Result Value Ref Range Status   MRSA by PCR Next Gen NOT DETECTED NOT DETECTED Final    Comment: (NOTE) The GeneXpert MRSA Assay (FDA approved for NASAL specimens only), is one component of a comprehensive MRSA colonization surveillance program. It is not intended to diagnose MRSA infection nor to guide or monitor treatment for MRSA infections. Test performance is not FDA approved in patients less than 24 years old. Performed at [facility], [address], [place], [place] [address]      Scheduled Meds:  apixaban  5 mg Oral BID   carvedilol  6.25 mg Oral BID WC   Chlorhexidine Gluconate Cloth  6 each Topical Daily   ezetimibe  10 mg Oral Daily   feeding supplement (GLUCERNA SHAKE)  237 mL Oral TID BM   furosemide  40 mg Intravenous Q12H   insulin glargine-yfgn  10 Units Subcutaneous QHS   sodium chloride flush  3 mL Intravenous Q12H   Continuous Infusions:  sodium chloride      Procedures/Studies: DG Chest Port 1 View  Result Date: 12/25/2021 CLINICAL DATA:  Shortness of breath, CHF. EXAM: PORTABLE CHEST 1 VIEW COMPARISON:  Chest x-ray 08/04/2021. FINDINGS: The heart is enlarged, unchanged. 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 There are small bilateral pleural effusions. There are central interstitial opacities bilaterally. There are some patchy opacities in both lung bases. There is no evidence for pneumothorax or acute fracture. IMPRESSION: 1. Cardiomegaly with mild interstitial edema and small pleural effusions. Electronically Signed   By: Ronney Asters M.D.   On: 12/25/2021 21:32   ECHOCARDIOGRAM COMPLETE  Result Date: 12/26/2021    ECHOCARDIOGRAM REPORT   Patient Name:   Tracey Bryant Date of Exam: 12/26/2021 Medical Rec #:  [id number]       Height:       66.0 in Accession #:    [id number]      Weight:       166.9 lb Date of Birth:  [birth year]       BSA:          1.852 m Patient Age:    70 years        BP:           132/64 mmHg Patient  Gender: F               HR:           64 bpm. Exam Location:  [facility] Procedure: 2D Echo, Cardiac Doppler and Color Doppler Indications:    CHF-Acute Systolic A56.97  History:        Patient has prior history of Echocardiogram examinations, most                 recent 03/18/2021. Cardiomyopathy, Stroke,  Arrythmias:Atrial                 Fibrillation; Risk Factors:Hypertension and Diabetes.  Sonographer:    Bernadene Person RDCS Referring Phys: [id number] OLADAPO ADEFESO IMPRESSIONS  1. Left ventricular ejection fraction, by estimation, is 40 to 45%. The left ventricle has mildly decreased function. The left ventricle demonstrates global hypokinesis. Left ventricular diastolic parameters are indeterminate.  2. Right ventricular systolic function is normal. The right ventricular size is normal. There is moderately elevated pulmonary artery systolic pressure. The estimated right ventricular systolic pressure is 25.4 mmHg.  3. Moderate pleural effusion in the left lateral region.  4. The mitral valve is grossly normal. Trivial mitral valve regurgitation.  5. The aortic valve is tricuspid. Aortic valve regurgitation is not visualized.  6. The inferior vena cava is normal in size with greater than 50% respiratory variability, suggesting right atrial pressure of 3 mmHg. Comparison(s): Changes from prior study are noted. 03/18/2021: LVEF 25-30%, grade 2 DD, severe LAE, RVSP 36 mmHg. FINDINGS  Left Ventricle: Left ventricular ejection fraction, by estimation, is 40 to 45%. The left ventricle has mildly decreased function. The left ventricle demonstrates global hypokinesis. The left ventricular internal cavity size was normal in size. There is  no left ventricular hypertrophy. Left ventricular diastolic function could not be evaluated due to atrial fibrillation. Left ventricular diastolic parameters are indeterminate. Right Ventricle: The right ventricular size is normal. No increase in right ventricular wall thickness. Right  ventricular systolic function is normal. There is moderately elevated pulmonary artery systolic pressure. The tricuspid regurgitant velocity is 3.27 m/s, and with an assumed right atrial pressure of 3 mmHg, the estimated right ventricular systolic pressure is 27.0 mmHg. Left Atrium: Left atrial size was normal in size. Right Atrium: Right atrial size was normal in size. Pericardium: There is no evidence of pericardial effusion. Mitral Valve: The mitral valve is grossly normal. Trivial mitral valve regurgitation. Tricuspid Valve: The tricuspid valve is grossly normal. Tricuspid valve regurgitation is trivial. Aortic Valve: The aortic valve is tricuspid. Aortic valve regurgitation is not visualized. Pulmonic Valve: The pulmonic valve was normal in structure. Pulmonic valve regurgitation is not visualized. Aorta: The aortic root and ascending aorta are structurally normal, with no evidence of dilitation. Venous: The inferior vena cava is normal in size with greater than 50% respiratory variability, suggesting right atrial pressure of 3 mmHg. IAS/Shunts: No atrial level shunt detected by color flow Doppler. Additional Comments: There is a moderate pleural effusion in the left lateral region.  LEFT VENTRICLE PLAX 2D LVIDd:         4.50 cm      Diastology LVIDs:         3.10 cm      LV e' medial:    5.35 cm/s LV PW:         1.00 cm      LV E/e' medial:  27.7 LV IVS:        1.00 cm      LV e' lateral:   7.09 cm/s LVOT diam:     1.90 cm      LV E/e' lateral: 20.9 LV SV:         48 LV SV Index:   26 LVOT Area:     2.84 cm  LV Volumes (MOD) LV vol d, MOD A2C: 125.0 ml LV vol d, MOD A4C: 119.0 ml LV vol s, MOD A2C: 76.7 ml LV vol s, MOD A4C: 69.1 ml LV SV MOD A2C:  48.3 ml LV SV MOD A4C:     119.0 ml LV SV MOD BP:      48.8 ml RIGHT VENTRICLE TAPSE (M-mode): 1.1 cm LEFT ATRIUM             Index        RIGHT ATRIUM           Index LA diam:        4.10 cm 2.21 cm/m   RA Area:     17.80 cm LA Vol (A2C):   60.5 ml 32.67  ml/m  RA Volume:   45.70 ml  24.68 ml/m LA Vol (A4C):   58.2 ml 31.43 ml/m LA Biplane Vol: 60.8 ml 32.83 ml/m  AORTIC VALVE LVOT Vmax:   89.73 cm/s LVOT Vmean:  57.133 cm/s LVOT VTI:    0.170 m  AORTA Ao Root diam: 2.90 cm Ao Asc diam:  3.20 cm MITRAL VALVE                TRICUSPID VALVE MV Area (PHT): 5.27 cm     TR Peak grad:   42.8 mmHg MV Decel Time: 144 msec     TR Vmax:        327.00 cm/s MV E velocity: 148.00 cm/s MV A velocity: 28.40 cm/s   SHUNTS MV E/A ratio:  5.21         Systemic VTI:  0.17 m                             Systemic Diam: 1.90 cm Lyman Bishop MD Electronically signed by Lyman Bishop MD Signature Date/Time: 12/26/2021/3:04:07 PM    Final     Orson Eva, DO  [facility]  If 7PM-7AM, please contact night-coverage [email or web address] Password [email or web address] 12/27/2021, 12:46 PM   LOS: 2 days

## 2021-12-28 DIAGNOSIS — J9601 Acute respiratory failure with hypoxia: Secondary | ICD-10-CM | POA: Diagnosis not present

## 2021-12-28 DIAGNOSIS — N184 Chronic kidney disease, stage 4 (severe): Secondary | ICD-10-CM | POA: Diagnosis not present

## 2021-12-28 DIAGNOSIS — I5023 Acute on chronic systolic (congestive) heart failure: Secondary | ICD-10-CM | POA: Diagnosis not present

## 2021-12-28 DIAGNOSIS — I480 Paroxysmal atrial fibrillation: Secondary | ICD-10-CM | POA: Diagnosis not present

## 2021-12-28 LAB — GLUCOSE, CAPILLARY
Glucose-Capillary: 347 mg/dL — ABNORMAL HIGH (ref 70–99)
Glucose-Capillary: 355 mg/dL — ABNORMAL HIGH (ref 70–99)
Glucose-Capillary: 204 mg/dL — ABNORMAL HIGH (ref 70–99)
Glucose-Capillary: 132 mg/dL — ABNORMAL HIGH (ref 70–99)

## 2021-12-28 LAB — BASIC METABOLIC PANEL
Anion gap: 11 (ref 5–15)
BUN: 49 mg/dL — ABNORMAL HIGH (ref 8–23)
CO2: 23 mmol/L (ref 22–32)
Calcium: 8.1 mg/dL — ABNORMAL LOW (ref 8.9–10.3)
Chloride: 103 mmol/L (ref 98–111)
Creatinine, Ser: 2.24 mg/dL — ABNORMAL HIGH (ref 0.44–1.00)
GFR, Estimated: 23 mL/min — ABNORMAL LOW (ref >60)
Glucose, Bld: 160 mg/dL — ABNORMAL HIGH (ref 70–99)
Potassium: 3.3 mmol/L — ABNORMAL LOW (ref 3.5–5.1)
Sodium: 137 mmol/L (ref 135–145)

## 2021-12-28 MED ORDER — POTASSIUM CHLORIDE CRYS ER 20 MEQ PO TBCR
20.0000 meq | EXTENDED_RELEASE_TABLET | Freq: Once | ORAL | Status: AC
  Administered 2021-12-28: 20 meq via ORAL
  Filled 2021-12-28: qty 1

## 2021-12-28 MED ORDER — INSULIN ASPART 100 UNIT/ML IJ SOLN
0.0000 [IU] | Freq: Every day | INTRAMUSCULAR | Status: DC
  Administered 2021-12-29: 4 [IU] via SUBCUTANEOUS

## 2021-12-28 MED ORDER — FUROSEMIDE 40 MG PO TABS
40.0000 mg | ORAL_TABLET | Freq: Every day | ORAL | Status: DC
  Administered 2021-12-29 – 2021-12-30 (×2): 40 mg via ORAL
  Filled 2021-12-28 (×2): qty 1

## 2021-12-28 MED ORDER — INSULIN ASPART 100 UNIT/ML IJ SOLN
0.0000 [IU] | Freq: Three times a day (TID) | INTRAMUSCULAR | Status: DC
  Administered 2021-12-28: 9 [IU] via SUBCUTANEOUS
  Administered 2021-12-29: 2 [IU] via SUBCUTANEOUS
  Administered 2021-12-29 – 2021-12-30 (×2): 3 [IU] via SUBCUTANEOUS

## 2021-12-28 NOTE — Progress Notes (Signed)
Patient is asleep on 3 liters nasal cannula. Hr 60 saturation 97 f 17 will leave off BiPAP for now as she appears comfortable. BiPAP on standby

## 2021-12-28 NOTE — Progress Notes (Signed)
Pt had sudden onset of increased generalized weakness from bathroom to chair, pt states "I feel like I'm going to pass out," pt's oxygen remained 99% on room air during the entire event, cbg 347, BP once patient was sitting in chair was 184/89, pt denies any pain or new vision lost; nurse notified MD; nurse will reevaluate patient's status to see if patient is appropriate to be transferred upstair.

## 2021-12-28 NOTE — Progress Notes (Signed)
Pt ambulated to bathroom with walker and nurse without difficulty, vital signs WNLs, patient states she feels better.

## 2021-12-28 NOTE — Progress Notes (Signed)
Checked patient sat.  Patient at 94% on RA.  No Bipap needed at this time.

## 2021-12-28 NOTE — Progress Notes (Signed)
Patient did well , did not use BiPAP

## 2021-12-28 NOTE — Progress Notes (Signed)
PROGRESS NOTE  Tracey Bryant NID:[id number] DOB: 1951-04-28 DOA: 12/25/2021 PCP: Coolidge Breeze, FNP  Brief History:  71 year old female with a history of diabetes mellitus type 2, systolic CHF with EF 14-43%, hypertension, stroke, hyperlipidemia, paroxysmal atrial fibrillation presenting with 3-day history of shortness of breath and worsening lower extremity edema.   She was also complaining of increasing abdominal girth.  She states that she has not taken her furosemide for several weeks.  She states that she has only been taking it on a as needed basis.  She denies any fevers, chills, headache, neck pain, chest pain, nausea, vomiting, diarrhea, abdominal pain.  There is no dysuria or hematuria. In the emergency department, the patient was noted to be hypoxic with oxygen saturation in the 80s.  She was placed on 6 L and subsequently BiPAP.  She was started on IV furosemide.  Chest x-ray showed increased interstitial edema and bilateral pleural effusions.  She was admitted for CHF work-up and treatment.   Assessment/Plan: Acute respiratory failure with hypoxia -Wean off BiPAP as tolerated -now on 3L>>2L -Weaned to room air for saturation greater 92% -Secondary to CHF/pulmonary edema   Acute on chronic systolic CHF -1/54/0086-PYPP EF of 25 to 30%, grade 2 DD -Continue furosemide 40 mg IV twice daily>>lasix 40 mg po daily -Daily weights -Accurate I's and O's -12/31 echo--EF 40-45%  , global HK, RVSP 45.8.  trivial MR/TR      -Continue carvedilol   Elevated troponin -Secondary to demand ischemia -No chest pain presently -Troponin 384>> 588>> 562 -Monitor clinically   CKD stage IV -Baseline creatinine has been widely variable but appears to have been ranging 1.8-2.3 -We will need to tolerate elevated serum creatinine for improved euvolemia   Paroxysmal atrial fibrillation -Presently in sinus rhythm -Continue apixaban   Diabetes mellitus type 2, uncontrolled  hyperglycemia -Continue reduced dose Semglee -NovoLog sliding scale -Hemoglobin A1c   Hyperlipidemia -Continue Zetia   Hypokalemia -replete -check mag   Subjective: Patient states she is breathing better.  She still has dyspnea with minimal exertion.  Denies f/c, cp, n/v/d, abd pain  Objective: Vitals:   12/28/21 1100 12/28/21 1151 12/28/21 1200 12/28/21 1201  BP:  116/74 (!) 105/46 (!) 105/46  Pulse: 60 73 (!) 59 64  Resp: 16 13 15 20   Temp:    98 F (36.7 C)  TempSrc:    Oral  SpO2: 95% 96% 100% 100%  Weight:      Height:        Intake/Output Summary (Last 24 hours) at 12/28/2021 1255 Last data filed at 12/28/2021 0630 Gross per 24 hour  Intake --  Output 1550 ml  Net -1550 ml   Weight change:  Exam:  General:  Pt is alert, follows commands appropriately, not in acute distress HEENT: No icterus, No thrush, No neck mass, [place]/AT Cardiovascular: RRR, S1/S2, no rubs, no gallops Respiratory: diminished BS.  Bibasilar crackles. No wheeze Abdomen: Soft/+BS, non tender, non distended, no guarding Extremities: No edema, No lymphangitis, No petechiae, No rashes, no synovitis   Data Reviewed: I have personally reviewed following labs and imaging studies Basic Metabolic Panel: Recent Labs  Lab 12/25/21 2110 12/26/21 0336 12/27/21 0434 12/28/21 0249  NA 135 137 136 137  K 4.2 4.5 3.4* 3.3*  CL 102 104 103 103  CO2 20* 23 26 23   GLUCOSE 276* 225* 117* 160*  BUN 36* 39* 55* 49*  CREATININE 2.52* 2.68* 2.36* 2.24*  CALCIUM 8.6* 8.3* 8.1* 8.1*  MG  --  2.0 2.0  --   PHOS  --  4.5  --   --    Liver Function Tests: Recent Labs  Lab 12/25/21 2110 12/26/21 0336  AST 17 18  ALT 12 13  ALKPHOS 73 64  BILITOT 0.9 0.7  PROT 7.4 6.7  ALBUMIN 3.1* 2.7*   No results for input(s): LIPASE, AMYLASE in the last 168 hours. No results for input(s): AMMONIA in the last 168 hours. Coagulation Profile: No results for input(s): INR, PROTIME in the last 168  hours. CBC: Recent Labs  Lab 12/25/21 2110 12/26/21 0336  WBC 10.3 7.0  NEUTROABS 9.0*  --   HGB 11.9* 10.5*  HCT 37.2 34.2*  MCV 93.9 94.7  PLT 410* 341   Cardiac Enzymes: No results for input(s): CKTOTAL, CKMB, CKMBINDEX, TROPONINI in the last 168 hours. BNP: Invalid input(s): POCBNP CBG: Recent Labs  Lab 12/27/21 2143  GLUCAP 276*   HbA1C: No results for input(s): HGBA1C in the last 72 hours. Urine analysis:    Component Value Date/Time   COLORURINE AMBER (A) 03/17/2021 1208   APPEARANCEUR CLOUDY (A) 03/17/2021 1208   LABSPEC 1.015 03/17/2021 1208   PHURINE 5.0 03/17/2021 1208   GLUCOSEU 50 (A) 03/17/2021 1208   HGBUR SMALL (A) 03/17/2021 1208   BILIRUBINUR NEGATIVE 03/17/2021 [place] 03/17/2021 1208   PROTEINUR >=300 (A) 03/17/2021 1208   UROBILINOGEN 0.2 01/17/2013 1910   NITRITE NEGATIVE 03/17/2021 1208   LEUKOCYTESUR LARGE (A) 03/17/2021 1208   Sepsis Labs: @LABRCNTIP (procalcitonin:4,lacticidven:4) ) Recent Results (from the past 240 hour(s))  Resp Panel by RT-PCR (Flu A&B, Covid) Nasopharyngeal Swab     Status: None   Collection Time: 12/25/21  8:46 PM   Specimen: Nasopharyngeal Swab; Nasopharyngeal(NP) swabs in vial transport medium  Result Value Ref Range Status   SARS Coronavirus 2 by RT PCR NEGATIVE NEGATIVE Final    Comment: (NOTE) SARS-CoV-2 target nucleic acids are NOT DETECTED.  The SARS-CoV-2 RNA is generally detectable in upper respiratory specimens during the acute phase of infection. The lowest concentration of SARS-CoV-2 viral copies this assay can detect is 138 copies/mL. A negative result does not preclude SARS-Cov-2 infection and should not be used as the sole basis for treatment or other patient management decisions. A negative result may occur with  improper specimen collection/handling, submission of specimen other than nasopharyngeal swab, presence of viral mutation(s) within the areas targeted by this assay,  and inadequate number of viral copies(<138 copies/mL). A negative result must be combined with clinical observations, patient history, and epidemiological information. The expected result is Negative.  Fact Sheet for Patients:  EntrepreneurPulse.com.au  Fact Sheet for Healthcare Providers:  IncredibleEmployment.be  This test is no t yet approved or cleared by the [place] FDA and  has been authorized for detection and/or diagnosis of SARS-CoV-2 by FDA under an Emergency Use Authorization (EUA). This EUA will remain  in effect (meaning this test can be used) for the duration of the COVID-19 declaration under Section 564(b)(1) of the Act, 21 U.S.C.section 360bbb-3(b)(1), unless the authorization is terminated  or revoked sooner.       Influenza A by PCR NEGATIVE NEGATIVE Final   Influenza B by PCR NEGATIVE NEGATIVE Final    Comment: (NOTE) The Xpert Xpress SARS-CoV-2/FLU/RSV plus assay is intended as an aid in the diagnosis of influenza from Nasopharyngeal swab specimens and should not be used as a sole basis for treatment. Nasal washings  and aspirates are unacceptable for Xpert Xpress SARS-CoV-2/FLU/RSV testing.  Fact Sheet for Patients: EntrepreneurPulse.com.au  Fact Sheet for Healthcare Providers: IncredibleEmployment.be  This test is not yet approved or cleared by the [place] FDA and has been authorized for detection and/or diagnosis of SARS-CoV-2 by FDA under an Emergency Use Authorization (EUA). This EUA will remain in effect (meaning this test can be used) for the duration of the COVID-19 declaration under Section 564(b)(1) of the Act, 21 U.S.C. section 360bbb-3(b)(1), unless the authorization is terminated or revoked.  Performed at [facility], [address], [place], [place] [address]   MRSA Next Gen by PCR, Nasal     Status: None   Collection Time: 12/26/21  8:07 AM   Specimen:  Nasal Mucosa; Nasal Swab  Result Value Ref Range Status   MRSA by PCR Next Gen NOT DETECTED NOT DETECTED Final    Comment: (NOTE) The GeneXpert MRSA Assay (FDA approved for NASAL specimens only), is one component of a comprehensive MRSA colonization surveillance program. It is not intended to diagnose MRSA infection nor to guide or monitor treatment for MRSA infections. Test performance is not FDA approved in patients less than 71 years old. Performed at [facility], [address], [place], [place] [address]      Scheduled Meds:  apixaban  5 mg Oral BID   carvedilol  6.25 mg Oral BID WC   Chlorhexidine Gluconate Cloth  6 each Topical Daily   ezetimibe  10 mg Oral Daily   feeding supplement (GLUCERNA SHAKE)  237 mL Oral TID BM   [START ON 12/29/2021] furosemide  40 mg Oral Daily   insulin aspart  0-5 Units Subcutaneous QHS   insulin aspart  0-9 Units Subcutaneous TID WC   insulin glargine-yfgn  10 Units Subcutaneous QHS   potassium chloride  20 mEq Oral Once   sodium chloride flush  3 mL Intravenous Q12H   Continuous Infusions:  sodium chloride      Procedures/Studies: DG Chest Port 1 View  Result Date: 12/25/2021 CLINICAL DATA:  Shortness of breath, CHF. EXAM: PORTABLE CHEST 1 VIEW COMPARISON:  Chest x-ray 08/04/2021. FINDINGS: The heart is enlarged, unchanged. 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 There are small bilateral pleural effusions. There are central interstitial opacities bilaterally. There are some patchy opacities in both lung bases. There is no evidence for pneumothorax or acute fracture. IMPRESSION: 1. Cardiomegaly with mild interstitial edema and small pleural effusions. Electronically Signed   By: Ronney Asters M.D.   On: 12/25/2021 21:32   ECHOCARDIOGRAM COMPLETE  Result Date: 12/26/2021    ECHOCARDIOGRAM REPORT   Patient Name:   Tracey Bryant Date of Exam: 12/26/2021 Medical Rec #:  [id number]       Height:       66.0 in Accession #:    [id number]      Weight:        166.9 lb Date of Birth:  [birth year]       BSA:          1.852 m Patient Age:    89 years        BP:           132/64 mmHg Patient Gender: F               HR:           64 bpm. Exam Location:  [facility] Procedure: 2D Echo, Cardiac Doppler and Color Doppler Indications:    CHF-Acute Systolic N39.76  History:        Patient has prior history of Echocardiogram examinations, most                 recent 03/18/2021. Cardiomyopathy, Stroke, Arrythmias:Atrial                 Fibrillation; Risk Factors:Hypertension and Diabetes.  Sonographer:    Bernadene Person RDCS Referring Phys: [id number] OLADAPO ADEFESO IMPRESSIONS  1. Left ventricular ejection fraction, by estimation, is 40 to 45%. The left ventricle has mildly decreased function. The left ventricle demonstrates global hypokinesis. Left ventricular diastolic parameters are indeterminate.  2. Right ventricular systolic function is normal. The right ventricular size is normal. There is moderately elevated pulmonary artery systolic pressure. The estimated right ventricular systolic pressure is 58.8 mmHg.  3. Moderate pleural effusion in the left lateral region.  4. The mitral valve is grossly normal. Trivial mitral valve regurgitation.  5. The aortic valve is tricuspid. Aortic valve regurgitation is not visualized.  6. The inferior vena cava is normal in size with greater than 50% respiratory variability, suggesting right atrial pressure of 3 mmHg. Comparison(s): Changes from prior study are noted. 03/18/2021: LVEF 25-30%, grade 2 DD, severe LAE, RVSP 36 mmHg. FINDINGS  Left Ventricle: Left ventricular ejection fraction, by estimation, is 40 to 45%. The left ventricle has mildly decreased function. The left ventricle demonstrates global hypokinesis. The left ventricular internal cavity size was normal in size. There is  no left ventricular hypertrophy. Left ventricular diastolic function could not be evaluated due to atrial fibrillation. Left ventricular diastolic  parameters are indeterminate. Right Ventricle: The right ventricular size is normal. No increase in right ventricular wall thickness. Right ventricular systolic function is normal. There is moderately elevated pulmonary artery systolic pressure. The tricuspid regurgitant velocity is 3.27 m/s, and with an assumed right atrial pressure of 3 mmHg, the estimated right ventricular systolic pressure is 50.2 mmHg. Left Atrium: Left atrial size was normal in size. Right Atrium: Right atrial size was normal in size. Pericardium: There is no evidence of pericardial effusion. Mitral Valve: The mitral valve is grossly normal. Trivial mitral valve regurgitation. Tricuspid Valve: The tricuspid valve is grossly normal. Tricuspid valve regurgitation is trivial. Aortic Valve: The aortic valve is tricuspid. Aortic valve regurgitation is not visualized. Pulmonic Valve: The pulmonic valve was normal in structure. Pulmonic valve regurgitation is not visualized. Aorta: The aortic root and ascending aorta are structurally normal, with no evidence of dilitation. Venous: The inferior vena cava is normal in size with greater than 50% respiratory variability, suggesting right atrial pressure of 3 mmHg. IAS/Shunts: No atrial level shunt detected by color flow Doppler. Additional Comments: There is a moderate pleural effusion in the left lateral region.  LEFT VENTRICLE PLAX 2D LVIDd:         4.50 cm      Diastology LVIDs:         3.10 cm      LV e' medial:    5.35 cm/s LV PW:         1.00 cm      LV E/e' medial:  27.7 LV IVS:        1.00 cm      LV e' lateral:   7.09 cm/s LVOT diam:     1.90 cm      LV E/e' lateral: 20.9 LV SV:         48 LV SV Index:   26 LVOT Area:     2.84 cm  LV Volumes (MOD) LV vol d, MOD A2C: 125.0 ml LV vol d, MOD A4C: 119.0 ml LV vol s, MOD A2C: 76.7 ml LV vol s, MOD A4C: 69.1 ml LV SV MOD A2C:     48.3 ml LV SV MOD A4C:     119.0 ml LV SV MOD BP:      48.8 ml RIGHT VENTRICLE TAPSE (M-mode): 1.1 cm LEFT ATRIUM              Index        RIGHT ATRIUM           Index LA diam:        4.10 cm 2.21 cm/m   RA Area:     17.80 cm LA Vol (A2C):   60.5 ml 32.67 ml/m  RA Volume:   45.70 ml  24.68 ml/m LA Vol (A4C):   58.2 ml 31.43 ml/m LA Biplane Vol: 60.8 ml 32.83 ml/m  AORTIC VALVE LVOT Vmax:   89.73 cm/s LVOT Vmean:  57.133 cm/s LVOT VTI:    0.170 m  AORTA Ao Root diam: 2.90 cm Ao Asc diam:  3.20 cm MITRAL VALVE                TRICUSPID VALVE MV Area (PHT): 5.27 cm     TR Peak grad:   42.8 mmHg MV Decel Time: 144 msec     TR Vmax:        327.00 cm/s MV E velocity: 148.00 cm/s MV A velocity: 28.40 cm/s   SHUNTS MV E/A ratio:  5.21         Systemic VTI:  0.17 m                             Systemic Diam: 1.90 cm Lyman Bishop MD Electronically signed by Lyman Bishop MD Signature Date/Time: 12/26/2021/3:04:07 PM    Final     Orson Eva, DO  [facility]  If 7PM-7AM, please contact night-coverage [email or web address] Password [email or web address] 12/28/2021, 12:55 PM   LOS: 3 days

## 2021-12-29 DIAGNOSIS — R778 Other specified abnormalities of plasma proteins: Secondary | ICD-10-CM | POA: Diagnosis not present

## 2021-12-29 DIAGNOSIS — J9601 Acute respiratory failure with hypoxia: Secondary | ICD-10-CM | POA: Diagnosis not present

## 2021-12-29 DIAGNOSIS — I5023 Acute on chronic systolic (congestive) heart failure: Secondary | ICD-10-CM | POA: Diagnosis not present

## 2021-12-29 DIAGNOSIS — N184 Chronic kidney disease, stage 4 (severe): Secondary | ICD-10-CM | POA: Diagnosis not present

## 2021-12-29 LAB — BASIC METABOLIC PANEL
Anion gap: 10 (ref 5–15)
BUN: 43 mg/dL — ABNORMAL HIGH (ref 8–23)
CO2: 24 mmol/L (ref 22–32)
Calcium: 8.3 mg/dL — ABNORMAL LOW (ref 8.9–10.3)
Chloride: 104 mmol/L (ref 98–111)
Creatinine, Ser: 1.96 mg/dL — ABNORMAL HIGH (ref 0.44–1.00)
GFR, Estimated: 27 mL/min — ABNORMAL LOW (ref >60)
Glucose, Bld: 126 mg/dL — ABNORMAL HIGH (ref 70–99)
Potassium: 3.5 mmol/L (ref 3.5–5.1)
Sodium: 138 mmol/L (ref 135–145)

## 2021-12-29 LAB — MAGNESIUM: Magnesium: 2.2 mg/dL (ref 1.7–2.4)

## 2021-12-29 LAB — GLUCOSE, CAPILLARY
Glucose-Capillary: 118 mg/dL — ABNORMAL HIGH (ref 70–99)
Glucose-Capillary: 206 mg/dL — ABNORMAL HIGH (ref 70–99)
Glucose-Capillary: 183 mg/dL — ABNORMAL HIGH (ref 70–99)
Glucose-Capillary: 321 mg/dL — ABNORMAL HIGH (ref 70–99)

## 2021-12-29 LAB — HEMOGLOBIN A1C
Hgb A1c MFr Bld: 7.7 % — ABNORMAL HIGH (ref 4.8–5.6)
Mean Plasma Glucose: 174.29 mg/dL

## 2021-12-29 MED ORDER — GUAIFENESIN-DM 100-10 MG/5ML PO SYRP
5.0000 mL | ORAL_SOLUTION | ORAL | Status: DC | PRN
  Administered 2021-12-29 – 2021-12-30 (×2): 5 mL via ORAL
  Filled 2021-12-29 (×2): qty 5

## 2021-12-29 NOTE — Progress Notes (Signed)
Physical Therapy Treatment Patient Details Name: Tracey Bryant MRN: [id number] DOB: 11-20-1951 Today's Date: 12/29/2021   History of Present Illness Tracey Bryant is a 71 y.o. female with medical history significant for paroxysmal A. fib, PVD, DVT, T2DM, hypertension, CVA and acute cholecystitis s/p cholecystostomy March 2022 who presented to the emergency department due to 2-day onset of worsening shortness of breath.  Patient states that she was placed on Lasix as needed for leg swelling, but was told to take it only intermittently so as to avoid kidney injury.  Patient states that she has not taken the medication in a week, she started to notice shortness of breath about 2 days ago, so she started to take the Lasix again, but there was no improvement in the shortness of breath.  EMS was activated and on arrival of EMS team, she was noted to have an O2 sat of 80% on room air, supplemental oxygen via [place] at 6 LPM was provided and  Improvement in O2 sat to 90%, she was taken to the ED for further evaluation and management.  Patient states that she noted some swelling of her legs, mild increase of abdominal girth, but she denies chest pain, fever, chills, headache.    PT Comments    Patient demonstrates increased endurance/distance for gait training with slightly labored movement  and occasional staggering to the right due to fair/poor coordination of RLE.  Patient had difficulty with RLE coordination  while completing BLE ROM/strengthening exercises,  that improved after verbal cues for patient to focus on bilateral reciprocal alternating movement of BLE.  Patient tolerated sitting up in chair after therapy.  Patient will benefit from continued skilled physical therapy in hospital and recommended venue below to increase strength, balance, endurance for safe ADLs and gait.    Recommendations for follow up therapy are one component of a multi-disciplinary discharge planning process, led by the attending  physician.  Recommendations may be updated based on patient status, additional functional criteria and insurance authorization.  Follow Up Recommendations  Home health PT     Assistance Recommended at Discharge PRN  Patient can return home with the following A little help with walking and/or transfers;Assistance with cooking/housework;A little help with bathing/dressing/bathroom   Equipment Recommendations  None recommended by PT    Recommendations for Other Services       Precautions / Restrictions Precautions Precautions: Fall Restrictions Weight Bearing Restrictions: No     Mobility  Bed Mobility Overal bed mobility: Modified Independent                  Transfers Overall transfer level: Needs assistance Equipment used: Rolling walker (2 wheels) Transfers: Sit to/from Stand;Bed to chair/wheelchair/BSC Sit to Stand: Supervision     Step pivot transfers: Supervision     General transfer comment: slightly unsteady, mild diffiuclty balancing on RLE due to in-coordination    Ambulation/Gait Ambulation/Gait assistance: Supervision;Min guard Gait Distance (Feet): 75 Feet   Gait Pattern/deviations: Decreased step length - right;Decreased step length - left;Decreased stride length;Decreased stance time - left;Staggering right Gait velocity: decreased     General Gait Details: demonstrates increased endurance/distance for ambulation with occasional staggering to the right due to fair/poor coordination of RLE withou loss of balance   Stairs             Wheelchair Mobility    Modified Rankin (Stroke Patients Only)       Balance Overall balance assessment: Needs assistance Sitting-balance support: Feet supported;No upper extremity supported  Sitting balance-Leahy Scale: Good Sitting balance - Comments: seated at EOB   Standing balance support: During functional activity;Bilateral upper extremity supported Standing balance-Leahy Scale:  Fair Standing balance comment: fair/good using RW                            Cognition Arousal/Alertness: Awake/alert Behavior During Therapy: WFL for tasks assessed/performed Overall Cognitive Status: Within Functional Limits for tasks assessed                                          Exercises General Exercises - Lower Extremity Long Arc Quad: Seated;AROM;Strengthening;Both;10 reps Hip Flexion/Marching: Seated;AROM;Strengthening;Both;10 reps Toe Raises: Seated;AROM;Strengthening;Both;10 reps Heel Raises: Seated;AROM;Strengthening;Both;10 reps    General Comments        Pertinent Vitals/Pain Pain Assessment: No/denies pain    Home Living                          Prior Function            PT Goals (current goals can now be found in the care plan section) Acute Rehab PT Goals Patient Stated Goal: return home with family to assist PT Goal Formulation: With patient Time For Goal Achievement: 12/31/21 Potential to Achieve Goals: Good Progress towards PT goals: Progressing toward goals    Frequency    Min 3X/week      PT Plan Current plan remains appropriate    Co-evaluation              AM-PAC PT "6 Clicks" Mobility   Outcome Measure  Help needed turning from your back to your side while in a flat bed without using bedrails?: None Help needed moving from lying on your back to sitting on the side of a flat bed without using bedrails?: None Help needed moving to and from a bed to a chair (including a wheelchair)?: A Little Help needed standing up from a chair using your arms (e.g., wheelchair or bedside chair)?: A Little Help needed to walk in hospital room?: A Little Help needed climbing 3-5 steps with a railing? : A Lot 6 Click Score: 19    End of Session   Activity Tolerance: Patient tolerated treatment well;Patient limited by fatigue Patient left: in chair;with call bell/phone within reach Nurse  Communication: Mobility status PT Visit Diagnosis: Unsteadiness on feet (R26.81);Other abnormalities of gait and mobility (R26.89);Muscle weakness (generalized) (M62.81)     Time: 2878-6767 PT Time Calculation (min) (ACUTE ONLY): 30 min  Charges:  $Gait Training: 8-22 mins $Therapeutic Exercise: 8-22 mins                     4:09 PM, 12/29/21 Lonell Grandchild, MPT Physical Therapist with [facility] [phone] office [phone] mobile phone

## 2021-12-29 NOTE — Progress Notes (Signed)
Patient ID: Tracey Bryant, female   DOB: Dec 09, 1951, 71 y.o.   MRN: [id number]    71 year old female with history of acute calculus cholecystitis status post percutaneous cholecystostomy tube placement on 03/19/2021. The patient has been deemed a poor surgical candidate, therefore planned for percutaneous gallstone retrieval in hopes of eventually becoming cholecystostomy tube free.  09/25/21: IMPRESSION: 1. Technically successful percutaneous lithotripsy resulting in fragmentation of single 2 cm gallstone. Approximately 40-50% of fragments were successfully removed percutaneously. 2. Replacement and upsize of cholecystostomy tube 214 [place] via the established track. The tube was placed to bag drainage. PLAN: Return in 3-4 weeks for repeat Spyglass assisted percutaneous gallstone retrieval. Keep to bag drainage until this time.  11/04/21: IMPRESSION: 1. Percutaneous lithotripsy and gallstone retrieval with removal of approximately 50% of the residual indwelling gallstone fragments. The cystic duct is obstructed. 2. Replacement of 14 French cholecystostomy tube, placed to bag drainage. PLAN: Return in 8 weeks for repeat choledochoscopic-assisted percutaneous gallstone retrieval. Keep to bag drainage until this time.   Pt [facility] inpt at [facility] Admitted with CHF exacerbation While inpatient--- RN tells me she was checking pt and chole tube. Noted that tube dressing was with odor and discharge at site Cleaned site and redressed Pt states drain "usually has some OP--- but has not in a few days." Per RN  Dr Tat has ordered IR evaluation  I have spoken to RN---- she says drain now is clean and dry No OP Tender to touch site Have not flushed drain Afeb; wbc wnl  Spoke to Dr Kathlene Cote---- will have RN flush drain every shift I am at [facility] tomorrow myself--- I will evaluate drain 12/30/21

## 2021-12-29 NOTE — Progress Notes (Signed)
PROGRESS NOTE  Tracey Bryant ZOX:[id number] DOB: September 07, 1951 DOA: 12/25/2021 PCP: Coolidge Breeze, FNP   Brief History:  71 year old female with a history of diabetes mellitus type 2, systolic CHF with EF 81-19%, hypertension, stroke, hyperlipidemia, paroxysmal atrial fibrillation presenting with 3-day history of shortness of breath and worsening lower extremity edema.   She was also complaining of increasing abdominal girth.  She states that she has not taken her furosemide for several weeks.  She states that she has only been taking it on a as needed basis.  She denies any fevers, chills, headache, neck pain, chest pain, nausea, vomiting, diarrhea, abdominal pain.  There is no dysuria or hematuria. In the emergency department, the patient was noted to be hypoxic with oxygen saturation in the 80s.  She was placed on 6 L and subsequently BiPAP.  She was started on IV furosemide.  Chest x-ray showed increased interstitial edema and bilateral pleural effusions.  She was admitted for CHF work-up and treatment.   Assessment/Plan: Acute respiratory failure with hypoxia -Wean off BiPAP as tolerated -now on 3L>>2L>>RA -Weaned to room air for saturation greater 92% -Secondary to CHF/pulmonary edema   Acute on chronic systolic CHF -1/47/8295-AOZH EF of 25 to 30%, grade 2 DD -Continue furosemide 40 mg IV twice daily>>lasix 40 mg po daily -Daily weights -Accurate I's and O's -12/31 echo--EF 40-45%  , global HK, RVSP 45.8.  trivial MR/TR      -Continue carvedilol  Biliary drain pain -IR consulted -plan to eval and flush 1/4   Elevated troponin -Secondary to demand ischemia -No chest pain presently -Troponin 384>> 588>> 562 -Monitor clinically   CKD stage IV -Baseline creatinine has been widely variable but appears to have been ranging 1.8-2.2 -We will need to tolerate elevated serum creatinine for improved euvolemia   Paroxysmal atrial fibrillation -Presently in sinus  rhythm -Continue apixaban   Diabetes mellitus type 2, uncontrolled hyperglycemia -Continue reduced dose Semglee -NovoLog sliding scale -12/29/21 Hemoglobin A1c--7.7   Hyperlipidemia -Continue Zetia   Hypokalemia -replete -check mag 2.2    Subjective: Patient denies fevers, chills, headache, chest pain, dyspnea, nausea, vomiting, diarrhea, abdominal pain, dysuria, hematuria, hematochezia, and melena.   Objective: Vitals:   12/28/21 2014 12/29/21 0300 12/29/21 0500 12/29/21 1318  BP: (!) 124/51  (!) 129/56 (!) 141/67  Pulse: 61  61 61  Resp: 20  16 20   Temp: 98.7 F (37.1 C)  98.3 F (36.8 C) 98 F (36.7 C)  TempSrc:   Oral Oral  SpO2: 95%  93% 96%  Weight:  75.7 kg    Height:        Intake/Output Summary (Last 24 hours) at 12/29/2021 1856 Last data filed at 12/29/2021 1854 Gross per 24 hour  Intake 720 ml  Output --  Net 720 ml   Weight change:  Exam:  General:  Pt is alert, follows commands appropriately, not in acute distress HEENT: No icterus, No thrush, No neck mass, [place]/AT Cardiovascular: RRR, S1/S2, no rubs, no gallops Respiratory: CTA bilaterally, no wheezing, no crackles, no rhonchi Abdomen: Soft/+BS, non tender, non distended, no guarding Extremities: No edema, No lymphangitis, No petechiae, No rashes, no synovitis   Data Reviewed: I have personally reviewed following labs and imaging studies Basic Metabolic Panel: Recent Labs  Lab 12/25/21 2110 12/26/21 0336 12/27/21 0434 12/28/21 0249 12/29/21 0457  NA 135 137 136 137 138  K 4.2 4.5 3.4* 3.3* 3.5  CL 102 104  103 103 104  CO2 20* 23 26 23 24   GLUCOSE 276* 225* 117* 160* 126*  BUN 36* 39* 55* 49* 43*  CREATININE 2.52* 2.68* 2.36* 2.24* 1.96*  CALCIUM 8.6* 8.3* 8.1* 8.1* 8.3*  MG  --  2.0 2.0  --  2.2  PHOS  --  4.5  --   --   --    Liver Function Tests: Recent Labs  Lab 12/25/21 2110 12/26/21 0336  AST 17 18  ALT 12 13  ALKPHOS 73 64  BILITOT 0.9 0.7  PROT 7.4 6.7  ALBUMIN 3.1*  2.7*   No results for input(s): LIPASE, AMYLASE in the last 168 hours. No results for input(s): AMMONIA in the last 168 hours. Coagulation Profile: No results for input(s): INR, PROTIME in the last 168 hours. CBC: Recent Labs  Lab 12/25/21 2110 12/26/21 0336  WBC 10.3 7.0  NEUTROABS 9.0*  --   HGB 11.9* 10.5*  HCT 37.2 34.2*  MCV 93.9 94.7  PLT 410* 341   Cardiac Enzymes: No results for input(s): CKTOTAL, CKMB, CKMBINDEX, TROPONINI in the last 168 hours. BNP: Invalid input(s): POCBNP CBG: Recent Labs  Lab 12/28/21 1937 12/28/21 2115 12/29/21 0709 12/29/21 1106 12/29/21 1612  GLUCAP 204* 132* 118* 206* 183*   HbA1C: Recent Labs    12/29/21 0457  HGBA1C 7.7*   Urine analysis:    Component Value Date/Time   COLORURINE AMBER (A) 03/17/2021 1208   APPEARANCEUR CLOUDY (A) 03/17/2021 1208   LABSPEC 1.015 03/17/2021 1208   PHURINE 5.0 03/17/2021 1208   GLUCOSEU 50 (A) 03/17/2021 1208   HGBUR SMALL (A) 03/17/2021 1208   BILIRUBINUR NEGATIVE 03/17/2021 [place] 03/17/2021 1208   PROTEINUR >=300 (A) 03/17/2021 1208   UROBILINOGEN 0.2 01/17/2013 1910   NITRITE NEGATIVE 03/17/2021 1208   LEUKOCYTESUR LARGE (A) 03/17/2021 1208   Sepsis Labs: @LABRCNTIP (procalcitonin:4,lacticidven:4) ) Recent Results (from the past 240 hour(s))  Resp Panel by RT-PCR (Flu A&B, Covid) Nasopharyngeal Swab     Status: None   Collection Time: 12/25/21  8:46 PM   Specimen: Nasopharyngeal Swab; Nasopharyngeal(NP) swabs in vial transport medium  Result Value Ref Range Status   SARS Coronavirus 2 by RT PCR NEGATIVE NEGATIVE Final    Comment: (NOTE) SARS-CoV-2 target nucleic acids are NOT DETECTED.  The SARS-CoV-2 RNA is generally detectable in upper respiratory specimens during the acute phase of infection. The lowest concentration of SARS-CoV-2 viral copies this assay can detect is 138 copies/mL. A negative result does not preclude SARS-Cov-2 infection and should not be  used as the sole basis for treatment or other patient management decisions. A negative result may occur with  improper specimen collection/handling, submission of specimen other than nasopharyngeal swab, presence of viral mutation(s) within the areas targeted by this assay, and inadequate number of viral copies(<138 copies/mL). A negative result must be combined with clinical observations, patient history, and epidemiological information. The expected result is Negative.  Fact Sheet for Patients:  EntrepreneurPulse.com.au  Fact Sheet for Healthcare Providers:  IncredibleEmployment.be  This test is no t yet approved or cleared by the [place] FDA and  has been authorized for detection and/or diagnosis of SARS-CoV-2 by FDA under an Emergency Use Authorization (EUA). This EUA will remain  in effect (meaning this test can be used) for the duration of the COVID-19 declaration under Section 564(b)(1) of the Act, 21 U.S.C.section 360bbb-3(b)(1), unless the authorization is terminated  or revoked sooner.       Influenza A by  PCR NEGATIVE NEGATIVE Final   Influenza B by PCR NEGATIVE NEGATIVE Final    Comment: (NOTE) The Xpert Xpress SARS-CoV-2/FLU/RSV plus assay is intended as an aid in the diagnosis of influenza from Nasopharyngeal swab specimens and should not be used as a sole basis for treatment. Nasal washings and aspirates are unacceptable for Xpert Xpress SARS-CoV-2/FLU/RSV testing.  Fact Sheet for Patients: EntrepreneurPulse.com.au  Fact Sheet for Healthcare Providers: IncredibleEmployment.be  This test is not yet approved or cleared by the [place] FDA and has been authorized for detection and/or diagnosis of SARS-CoV-2 by FDA under an Emergency Use Authorization (EUA). This EUA will remain in effect (meaning this test can be used) for the duration of the COVID-19 declaration under Section  564(b)(1) of the Act, 21 U.S.C. section 360bbb-3(b)(1), unless the authorization is terminated or revoked.  Performed at [facility], [address], [place], [place] [address]   MRSA Next Gen by PCR, Nasal     Status: None   Collection Time: 12/26/21  8:07 AM   Specimen: Nasal Mucosa; Nasal Swab  Result Value Ref Range Status   MRSA by PCR Next Gen NOT DETECTED NOT DETECTED Final    Comment: (NOTE) The GeneXpert MRSA Assay (FDA approved for NASAL specimens only), is one component of a comprehensive MRSA colonization surveillance program. It is not intended to diagnose MRSA infection nor to guide or monitor treatment for MRSA infections. Test performance is not FDA approved in patients less than 72 years old. Performed at [facility], [address] [place], [place] [address]      Scheduled Meds:  apixaban  5 mg Oral BID   carvedilol  6.25 mg Oral BID WC   Chlorhexidine Gluconate Cloth  6 each Topical Daily   ezetimibe  10 mg Oral Daily   feeding supplement (GLUCERNA SHAKE)  237 mL Oral TID BM   furosemide  40 mg Oral Daily   insulin aspart  0-5 Units Subcutaneous QHS   insulin aspart  0-9 Units Subcutaneous TID WC   insulin glargine-yfgn  10 Units Subcutaneous QHS   sodium chloride flush  3 mL Intravenous Q12H   Continuous Infusions:  sodium chloride      Procedures/Studies: DG Chest Port 1 View  Result Date: 12/25/2021 CLINICAL DATA:  Shortness of breath, CHF. EXAM: PORTABLE CHEST 1 VIEW COMPARISON:  Chest x-ray 08/04/2021. FINDINGS: The heart is enlarged, unchanged. 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 There are small bilateral pleural effusions. There are central interstitial opacities bilaterally. There are some patchy opacities in both lung bases. There is no evidence for pneumothorax or acute fracture. IMPRESSION: 1. Cardiomegaly with mild interstitial edema and small pleural effusions. Electronically Signed   By: Ronney Asters M.D.   On: 12/25/2021 21:32    ECHOCARDIOGRAM COMPLETE  Result Date: 12/26/2021    ECHOCARDIOGRAM REPORT   Patient Name:   MALALA TRENKAMP Date of Exam: 12/26/2021 Medical Rec #:  [id number]       Height:       66.0 in Accession #:    [id number]      Weight:       166.9 lb Date of Birth:  10-04-1951       BSA:          1.852 m Patient Age:    69 years        BP:           132/64 mmHg Patient Gender: F  HR:           64 bpm. Exam Location:  [facility] Procedure: 2D Echo, Cardiac Doppler and Color Doppler Indications:    CHF-Acute Systolic Y18.56  History:        Patient has prior history of Echocardiogram examinations, most                 recent 03/18/2021. Cardiomyopathy, Stroke, Arrythmias:Atrial                 Fibrillation; Risk Factors:Hypertension and Diabetes.  Sonographer:    Bernadene Person RDCS Referring Phys: [id number] OLADAPO ADEFESO IMPRESSIONS  1. Left ventricular ejection fraction, by estimation, is 40 to 45%. The left ventricle has mildly decreased function. The left ventricle demonstrates global hypokinesis. Left ventricular diastolic parameters are indeterminate.  2. Right ventricular systolic function is normal. The right ventricular size is normal. There is moderately elevated pulmonary artery systolic pressure. The estimated right ventricular systolic pressure is 63.7 mmHg.  3. Moderate pleural effusion in the left lateral region.  4. The mitral valve is grossly normal. Trivial mitral valve regurgitation.  5. The aortic valve is tricuspid. Aortic valve regurgitation is not visualized.  6. The inferior vena cava is normal in size with greater than 50% respiratory variability, suggesting right atrial pressure of 3 mmHg. Comparison(s): Changes from prior study are noted. 03/18/2021: LVEF 25-30%, grade 2 DD, severe LAE, RVSP 36 mmHg. FINDINGS  Left Ventricle: Left ventricular ejection fraction, by estimation, is 40 to 45%. The left ventricle has mildly decreased function. The left ventricle demonstrates global  hypokinesis. The left ventricular internal cavity size was normal in size. There is  no left ventricular hypertrophy. Left ventricular diastolic function could not be evaluated due to atrial fibrillation. Left ventricular diastolic parameters are indeterminate. Right Ventricle: The right ventricular size is normal. No increase in right ventricular wall thickness. Right ventricular systolic function is normal. There is moderately elevated pulmonary artery systolic pressure. The tricuspid regurgitant velocity is 3.27 m/s, and with an assumed right atrial pressure of 3 mmHg, the estimated right ventricular systolic pressure is 85.8 mmHg. Left Atrium: Left atrial size was normal in size. Right Atrium: Right atrial size was normal in size. Pericardium: There is no evidence of pericardial effusion. Mitral Valve: The mitral valve is grossly normal. Trivial mitral valve regurgitation. Tricuspid Valve: The tricuspid valve is grossly normal. Tricuspid valve regurgitation is trivial. Aortic Valve: The aortic valve is tricuspid. Aortic valve regurgitation is not visualized. Pulmonic Valve: The pulmonic valve was normal in structure. Pulmonic valve regurgitation is not visualized. Aorta: The aortic root and ascending aorta are structurally normal, with no evidence of dilitation. Venous: The inferior vena cava is normal in size with greater than 50% respiratory variability, suggesting right atrial pressure of 3 mmHg. IAS/Shunts: No atrial level shunt detected by color flow Doppler. Additional Comments: There is a moderate pleural effusion in the left lateral region.  LEFT VENTRICLE PLAX 2D LVIDd:         4.50 cm      Diastology LVIDs:         3.10 cm      LV e' medial:    5.35 cm/s LV PW:         1.00 cm      LV E/e' medial:  27.7 LV IVS:        1.00 cm      LV e' lateral:   7.09 cm/s LVOT diam:     1.90 cm  LV E/e' lateral: 20.9 LV SV:         48 LV SV Index:   26 LVOT Area:     2.84 cm  LV Volumes (MOD) LV vol d, MOD A2C:  125.0 ml LV vol d, MOD A4C: 119.0 ml LV vol s, MOD A2C: 76.7 ml LV vol s, MOD A4C: 69.1 ml LV SV MOD A2C:     48.3 ml LV SV MOD A4C:     119.0 ml LV SV MOD BP:      48.8 ml RIGHT VENTRICLE TAPSE (M-mode): 1.1 cm LEFT ATRIUM             Index        RIGHT ATRIUM           Index LA diam:        4.10 cm 2.21 cm/m   RA Area:     17.80 cm LA Vol (A2C):   60.5 ml 32.67 ml/m  RA Volume:   45.70 ml  24.68 ml/m LA Vol (A4C):   58.2 ml 31.43 ml/m LA Biplane Vol: 60.8 ml 32.83 ml/m  AORTIC VALVE LVOT Vmax:   89.73 cm/s LVOT Vmean:  57.133 cm/s LVOT VTI:    0.170 m  AORTA Ao Root diam: 2.90 cm Ao Asc diam:  3.20 cm MITRAL VALVE                TRICUSPID VALVE MV Area (PHT): 5.27 cm     TR Peak grad:   42.8 mmHg MV Decel Time: 144 msec     TR Vmax:        327.00 cm/s MV E velocity: 148.00 cm/s MV A velocity: 28.40 cm/s   SHUNTS MV E/A ratio:  5.21         Systemic VTI:  0.17 m                             Systemic Diam: 1.90 cm Lyman Bishop MD Electronically signed by Lyman Bishop MD Signature Date/Time: 12/26/2021/3:04:07 PM    Final     Orson Eva, DO  [facility]  If 7PM-7AM, please contact night-coverage [email or web address] Password [email or web address] 12/29/2021, 6:56 PM   LOS: 4 days

## 2021-12-29 NOTE — Plan of Care (Signed)

## 2021-12-29 NOTE — Care Management Important Message (Signed)
Important Message  Patient Details  Name: Tracey Bryant MRN: [id number] Date of Birth: 02/07/1951   Medicare Important Message Given:  Yes     Tommy Medal 12/29/2021, 12:23 PM

## 2021-12-30 DIAGNOSIS — R778 Other specified abnormalities of plasma proteins: Secondary | ICD-10-CM | POA: Diagnosis not present

## 2021-12-30 DIAGNOSIS — I5023 Acute on chronic systolic (congestive) heart failure: Secondary | ICD-10-CM | POA: Diagnosis not present

## 2021-12-30 DIAGNOSIS — I480 Paroxysmal atrial fibrillation: Secondary | ICD-10-CM | POA: Diagnosis not present

## 2021-12-30 DIAGNOSIS — J9601 Acute respiratory failure with hypoxia: Secondary | ICD-10-CM | POA: Diagnosis not present

## 2021-12-30 LAB — BASIC METABOLIC PANEL
Anion gap: 7 (ref 5–15)
BUN: 38 mg/dL — ABNORMAL HIGH (ref 8–23)
CO2: 25 mmol/L (ref 22–32)
Calcium: 8.2 mg/dL — ABNORMAL LOW (ref 8.9–10.3)
Chloride: 104 mmol/L (ref 98–111)
Creatinine, Ser: 2.06 mg/dL — ABNORMAL HIGH (ref 0.44–1.00)
GFR, Estimated: 25 mL/min — ABNORMAL LOW (ref >60)
Glucose, Bld: 105 mg/dL — ABNORMAL HIGH (ref 70–99)
Potassium: 4 mmol/L (ref 3.5–5.1)
Sodium: 136 mmol/L (ref 135–145)

## 2021-12-30 LAB — GLUCOSE, CAPILLARY
Glucose-Capillary: 90 mg/dL (ref 70–99)
Glucose-Capillary: 214 mg/dL — ABNORMAL HIGH (ref 70–99)

## 2021-12-30 MED ORDER — INSULIN GLARGINE 100 UNIT/ML ~~LOC~~ SOLN: 10.0000 [IU] | Freq: Every day | SUBCUTANEOUS | 0 refills | Status: DC

## 2021-12-30 MED ORDER — FUROSEMIDE 40 MG PO TABS: 40.0000 mg | ORAL_TABLET | Freq: Every day | ORAL | 0 refills | Status: DC

## 2021-12-30 NOTE — Progress Notes (Signed)
Inpatient Diabetes Program Recommendations  AACE/ADA: New Consensus Statement on Inpatient Glycemic Control   Target Ranges:  Prepandial:   less than 140 mg/dL      Peak postprandial:   less than 180 mg/dL (1-2 hours)      Critically ill patients:  140 - 180 mg/dL    Latest Reference Range & Units 12/29/21 07:09 12/29/21 11:06 12/29/21 16:12 12/29/21 21:01 12/30/21 07:05  Glucose-Capillary 70 - 99 mg/dL 118 (H) 206 (H) 183 (H) 321 (H) 90   Review of Glycemic Control  Diabetes history: DM2 Outpatient Diabetes medications: Glipizide XL 5 mg daily, Lantus 25 units QHS, Novolog 2-10 units TID with meals Current orders for Inpatient glycemic control: Semglee 10 units QHS, Novolog 0-9 units TID with meals, Novolog 0-5 units QHS  Inpatient Diabetes Program Recommendations:    Insulin: May want to consider ordering Novolog 2-3 units TID with meals for meal coverage if patient eats at least 50% of meals.  Thanks, Barnie Alderman, RN, MSN, CDE Diabetes Coordinator Inpatient Diabetes Program [phone] (Team Pager from 8am to 5pm)

## 2021-12-30 NOTE — Discharge Summary (Signed)
Physician Discharge Summary  Tracey Bryant YKD:[id number] DOB: 09-Dec-1951 DOA: 12/25/2021  PCP: Tracey Breeze, FNP  Admit date: 12/25/2021 Discharge date: 12/30/2021  Admitted From:  HOME  Disposition: HOME with [place]   Recommendations for Outpatient Follow-up:  Follow up with PCP in 1 weeks Follow up with IR Radiology clinic as scheduled  Please obtain BMP/CBC in 1-2 weeks   Home Health:  PT   Discharge Condition: STABLE   CODE STATUS: FULL  DIET:  carb modified    Brief Hospitalization Summary: Please see all hospital notes, images, labs for full details of the hospitalization. Brief History:  71 year old female with a history of diabetes mellitus type 2, systolic CHF with EF 39-76%, hypertension, stroke, hyperlipidemia, paroxysmal atrial fibrillation presenting with 3-day history of shortness of breath and worsening lower extremity edema.   She was also complaining of increasing abdominal girth.  She states that she has not taken her furosemide for several weeks.  She states that she has only been taking it on a as needed basis.  She denies any fevers, chills, headache, neck pain, chest pain, nausea, vomiting, diarrhea, abdominal pain.  There is no dysuria or hematuria.  In the emergency department, the patient was noted to be hypoxic with oxygen saturation in the 80s.  She was placed on 6 L and subsequently BiPAP.  She was started on IV furosemide.  Chest x-ray showed increased interstitial edema and bilateral pleural effusions.  She was admitted for CHF work-up and treatment.   Assessment/Plan:  Acute respiratory failure with hypoxia -Wean off BiPAP as tolerated -now on 3L>>2L>>RA -Weaned to room air for saturation greater 92% -Secondary to CHF/pulmonary edema - TREATED AND RESOLVED   Acute on chronic systolic CHF -7/34/1937-TKWI EF of 25 to 30%, grade 2 DD -Continue furosemide 40 mg IV twice daily>>lasix 40 mg po daily -Daily weights -Accurate I's and O's -12/31  echo--EF 40-45%  , global HK, RVSP 45.8.  trivial MR/TR      -Continue carvedilol   Biliary drain pain -IR consulted -plan to eval and flush 1/4 - pt evaluated by IR on 1/4 and no problems identified, outpatient follow up recommended  Elevated troponin -Secondary to demand ischemia -No chest pain presently -Troponin 384>> 588>> 562 -stable    CKD stage IV -Baseline creatinine has been widely variable but appears to have been ranging 1.8-2.2 -We will need to tolerate elevated serum creatinine for improved euvolemia   Paroxysmal atrial fibrillation -Presently in sinus rhythm -Continue apixaban   Diabetes mellitus type 2, uncontrolled hyperglycemia -Continue reduced dose Semglee -NovoLog sliding scale -12/29/21 Hemoglobin A1c--7.7   Hyperlipidemia -stable    Hypokalemia -replete -check mag 2.2  Discharge Diagnoses:  Principal Problem:   Acute on chronic HFrEF (heart failure with reduced ejection fraction) (HCC) Active Problems:   CVA (cerebral vascular accident) ([place])   Paroxysmal atrial fibrillation ([place])   DVT (deep venous thrombosis) ([place])   Acute respiratory failure with hypoxia (HCC)   Elevated troponin   Elevated brain natriuretic peptide (BNP) level   Hypoalbuminemia due to protein-calorie malnutrition (HCC)   CKD (chronic kidney disease), stage IV [facility])   Discharge Instructions:  Allergies as of 12/30/2021   No Known Allergies      Medication List     STOP taking these medications    amLODipine 10 MG tablet Commonly known as: NORVASC   ezetimibe 10 MG tablet Commonly known as: ZETIA   insulin aspart 100 UNIT/ML injection Commonly known as: NovoLOG  isosorbide-hydrALAZINE 20-37.5 MG tablet Commonly known as: BIDIL       TAKE these medications    acetaminophen 500 MG tablet Commonly known as: TYLENOL Take 1 tablet (500 mg total) by mouth every 8 (eight) hours as needed for moderate pain.   apixaban 5 MG Tabs tablet Commonly known as:  ELIQUIS Take 1 tablet (5 mg total) by mouth 2 (two) times daily.   carvedilol 6.25 MG tablet Commonly known as: COREG Take 1 tablet (6.25 mg total) by mouth 2 (two) times daily with a meal.   furosemide 40 MG tablet Commonly known as: LASIX Take 1 tablet (40 mg total) by mouth daily. What changed:  medication strength how much to take   glipiZIDE 5 MG 24 hr tablet Commonly known as: GLUCOTROL XL Take 5 mg by mouth daily.   insulin glargine 100 UNIT/ML injection Commonly known as: LANTUS Inject 0.1 mLs (10 Units total) into the skin at bedtime. What changed: how much to take        Follow-up Information     Tracey Breeze, FNP. Schedule an appointment as soon as possible for a visit in 1 week(s).   Specialty: Family Medicine Why: Hospital Follow Up Contact information: [address] [place] [address] [phone]                No Known Allergies Allergies as of 12/30/2021   No Known Allergies      Medication List     STOP taking these medications    amLODipine 10 MG tablet Commonly known as: NORVASC   ezetimibe 10 MG tablet Commonly known as: ZETIA   insulin aspart 100 UNIT/ML injection Commonly known as: NovoLOG   isosorbide-hydrALAZINE 20-37.5 MG tablet Commonly known as: BIDIL       TAKE these medications    acetaminophen 500 MG tablet Commonly known as: TYLENOL Take 1 tablet (500 mg total) by mouth every 8 (eight) hours as needed for moderate pain.   apixaban 5 MG Tabs tablet Commonly known as: ELIQUIS Take 1 tablet (5 mg total) by mouth 2 (two) times daily.   carvedilol 6.25 MG tablet Commonly known as: COREG Take 1 tablet (6.25 mg total) by mouth 2 (two) times daily with a meal.   furosemide 40 MG tablet Commonly known as: LASIX Take 1 tablet (40 mg total) by mouth daily. What changed:  medication strength how much to take   glipiZIDE 5 MG 24 hr tablet Commonly known as: GLUCOTROL XL Take 5 mg by mouth daily.    insulin glargine 100 UNIT/ML injection Commonly known as: LANTUS Inject 0.1 mLs (10 Units total) into the skin at bedtime. What changed: how much to take        Procedures/Studies: DG Chest Port 1 View  Result Date: 12/25/2021 CLINICAL DATA:  Shortness of breath, CHF. EXAM: PORTABLE CHEST 1 VIEW COMPARISON:  Chest x-ray 08/04/2021. FINDINGS: The heart is enlarged, unchanged. 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 There are small bilateral pleural effusions. There are central interstitial opacities bilaterally. There are some patchy opacities in both lung bases. There is no evidence for pneumothorax or acute fracture. IMPRESSION: 1. Cardiomegaly with mild interstitial edema and small pleural effusions. Electronically Signed   By: Ronney Asters M.D.   On: 12/25/2021 21:32   ECHOCARDIOGRAM COMPLETE  Result Date: 12/26/2021    ECHOCARDIOGRAM REPORT   Patient Name:   Tracey Bryant Date of Exam: 12/26/2021 Medical Rec #:  [id number]  Height:       66.0 in Accession #:    [id number]      Weight:       166.9 lb Date of Birth:  07-26-1951       BSA:          1.852 m Patient Age:    43 years        BP:           132/64 mmHg Patient Gender: F               HR:           64 bpm. Exam Location:  [facility] Procedure: 2D Echo, Cardiac Doppler and Color Doppler Indications:    CHF-Acute Systolic O35.00  History:        Patient has prior history of Echocardiogram examinations, most                 recent 03/18/2021. Cardiomyopathy, Stroke, Arrythmias:Atrial                 Fibrillation; Risk Factors:Hypertension and Diabetes.  Sonographer:    Bernadene Person RDCS Referring Phys: [id number] OLADAPO ADEFESO IMPRESSIONS  1. Left ventricular ejection fraction, by estimation, is 40 to 45%. The left ventricle has mildly decreased function. The left ventricle demonstrates global hypokinesis. Left ventricular diastolic parameters are indeterminate.  2. Right ventricular systolic function is normal. The right ventricular  size is normal. There is moderately elevated pulmonary artery systolic pressure. The estimated right ventricular systolic pressure is 93.7 mmHg.  3. Moderate pleural effusion in the left lateral region.  4. The mitral valve is grossly normal. Trivial mitral valve regurgitation.  5. The aortic valve is tricuspid. Aortic valve regurgitation is not visualized.  6. The inferior vena cava is normal in size with greater than 50% respiratory variability, suggesting right atrial pressure of 3 mmHg. Comparison(s): Changes from prior study are noted. 03/18/2021: LVEF 25-30%, grade 2 DD, severe LAE, RVSP 36 mmHg. FINDINGS  Left Ventricle: Left ventricular ejection fraction, by estimation, is 40 to 45%. The left ventricle has mildly decreased function. The left ventricle demonstrates global hypokinesis. The left ventricular internal cavity size was normal in size. There is  no left ventricular hypertrophy. Left ventricular diastolic function could not be evaluated due to atrial fibrillation. Left ventricular diastolic parameters are indeterminate. Right Ventricle: The right ventricular size is normal. No increase in right ventricular wall thickness. Right ventricular systolic function is normal. There is moderately elevated pulmonary artery systolic pressure. The tricuspid regurgitant velocity is 3.27 m/s, and with an assumed right atrial pressure of 3 mmHg, the estimated right ventricular systolic pressure is 16.9 mmHg. Left Atrium: Left atrial size was normal in size. Right Atrium: Right atrial size was normal in size. Pericardium: There is no evidence of pericardial effusion. Mitral Valve: The mitral valve is grossly normal. Trivial mitral valve regurgitation. Tricuspid Valve: The tricuspid valve is grossly normal. Tricuspid valve regurgitation is trivial. Aortic Valve: The aortic valve is tricuspid. Aortic valve regurgitation is not visualized. Pulmonic Valve: The pulmonic valve was normal in structure. Pulmonic valve  regurgitation is not visualized. Aorta: The aortic root and ascending aorta are structurally normal, with no evidence of dilitation. Venous: The inferior vena cava is normal in size with greater than 50% respiratory variability, suggesting right atrial pressure of 3 mmHg. IAS/Shunts: No atrial level shunt detected by color flow Doppler. Additional Comments: There is a moderate pleural effusion in the left lateral region.  LEFT  VENTRICLE PLAX 2D LVIDd:         4.50 cm      Diastology LVIDs:         3.10 cm      LV e' medial:    5.35 cm/s LV PW:         1.00 cm      LV E/e' medial:  27.7 LV IVS:        1.00 cm      LV e' lateral:   7.09 cm/s LVOT diam:     1.90 cm      LV E/e' lateral: 20.9 LV SV:         48 LV SV Index:   26 LVOT Area:     2.84 cm  LV Volumes (MOD) LV vol d, MOD A2C: 125.0 ml LV vol d, MOD A4C: 119.0 ml LV vol s, MOD A2C: 76.7 ml LV vol s, MOD A4C: 69.1 ml LV SV MOD A2C:     48.3 ml LV SV MOD A4C:     119.0 ml LV SV MOD BP:      48.8 ml RIGHT VENTRICLE TAPSE (M-mode): 1.1 cm LEFT ATRIUM             Index        RIGHT ATRIUM           Index LA diam:        4.10 cm 2.21 cm/m   RA Area:     17.80 cm LA Vol (A2C):   60.5 ml 32.67 ml/m  RA Volume:   45.70 ml  24.68 ml/m LA Vol (A4C):   58.2 ml 31.43 ml/m LA Biplane Vol: 60.8 ml 32.83 ml/m  AORTIC VALVE LVOT Vmax:   89.73 cm/s LVOT Vmean:  57.133 cm/s LVOT VTI:    0.170 m  AORTA Ao Root diam: 2.90 cm Ao Asc diam:  3.20 cm MITRAL VALVE                TRICUSPID VALVE MV Area (PHT): 5.27 cm     TR Peak grad:   42.8 mmHg MV Decel Time: 144 msec     TR Vmax:        327.00 cm/s MV E velocity: 148.00 cm/s MV A velocity: 28.40 cm/s   SHUNTS MV E/A ratio:  5.21         Systemic VTI:  0.17 m                             Systemic Diam: 1.90 cm Lyman Bishop MD Electronically signed by Lyman Bishop MD Signature Date/Time: 12/26/2021/3:04:07 PM    Final      Subjective: Pt reports feeling much better today and feels well to go home.    Discharge  Exam: Vitals:   12/29/21 2059 12/30/21 0415  BP: (!) 128/45 130/60  Pulse: 65 (!) 59  Resp: 18 18  Temp: 100.3 F (37.9 C) 99.8 F (37.7 C)  SpO2: 97% 98%   Vitals:   12/29/21 1318 12/29/21 2059 12/30/21 0415 12/30/21 0600  BP: (!) 141/67 (!) 128/45 130/60   Pulse: 61 65 (!) 59   Resp: 20 18 18    Temp: 98 F (36.7 C) 100.3 F (37.9 C) 99.8 F (37.7 C)   TempSrc: Oral Oral Oral   SpO2: 96% 97% 98%   Weight:    72.3 kg  Height:        General: Pt  is alert, awake, not in acute distress Cardiovascular: RRR, S1/S2 +, no rubs, no gallops Respiratory: CTA bilaterally, no wheezing, no rhonchi Abdominal: Soft, NT, ND, bowel sounds + Extremities: no edema, no cyanosis   The results of significant diagnostics from this hospitalization (including imaging, microbiology, ancillary and laboratory) are listed below for reference.     Microbiology: Recent Results (from the past 240 hour(s))  Resp Panel by RT-PCR (Flu A&B, Covid) Nasopharyngeal Swab     Status: None   Collection Time: 12/25/21  8:46 PM   Specimen: Nasopharyngeal Swab; Nasopharyngeal(NP) swabs in vial transport medium  Result Value Ref Range Status   SARS Coronavirus 2 by RT PCR NEGATIVE NEGATIVE Final    Comment: (NOTE) SARS-CoV-2 target nucleic acids are NOT DETECTED.  The SARS-CoV-2 RNA is generally detectable in upper respiratory specimens during the acute phase of infection. The lowest concentration of SARS-CoV-2 viral copies this assay can detect is 138 copies/mL. A negative result does not preclude SARS-Cov-2 infection and should not be used as the sole basis for treatment or other patient management decisions. A negative result may occur with  improper specimen collection/handling, submission of specimen other than nasopharyngeal swab, presence of viral mutation(s) within the areas targeted by this assay, and inadequate number of viral copies(<138 copies/mL). A negative result must be combined  with clinical observations, patient history, and epidemiological information. The expected result is Negative.  Fact Sheet for Patients:  EntrepreneurPulse.com.au  Fact Sheet for Healthcare Providers:  IncredibleEmployment.be  This test is no t yet approved or cleared by the [place] FDA and  has been authorized for detection and/or diagnosis of SARS-CoV-2 by FDA under an Emergency Use Authorization (EUA). This EUA will remain  in effect (meaning this test can be used) for the duration of the COVID-19 declaration under Section 564(b)(1) of the Act, 21 U.S.C.section 360bbb-3(b)(1), unless the authorization is terminated  or revoked sooner.       Influenza A by PCR NEGATIVE NEGATIVE Final   Influenza B by PCR NEGATIVE NEGATIVE Final    Comment: (NOTE) The Xpert Xpress SARS-CoV-2/FLU/RSV plus assay is intended as an aid in the diagnosis of influenza from Nasopharyngeal swab specimens and should not be used as a sole basis for treatment. Nasal washings and aspirates are unacceptable for Xpert Xpress SARS-CoV-2/FLU/RSV testing.  Fact Sheet for Patients: EntrepreneurPulse.com.au  Fact Sheet for Healthcare Providers: IncredibleEmployment.be  This test is not yet approved or cleared by the [place] FDA and has been authorized for detection and/or diagnosis of SARS-CoV-2 by FDA under an Emergency Use Authorization (EUA). This EUA will remain in effect (meaning this test can be used) for the duration of the COVID-19 declaration under Section 564(b)(1) of the Act, 21 U.S.C. section 360bbb-3(b)(1), unless the authorization is terminated or revoked.  Performed at [facility], [address], [name], [place] [address]   MRSA Next Gen by PCR, Nasal     Status: None   Collection Time: 12/26/21  8:07 AM   Specimen: Nasal Mucosa; Nasal Swab  Result Value Ref Range Status   MRSA by PCR Next Gen NOT DETECTED  NOT DETECTED Final    Comment: (NOTE) The GeneXpert MRSA Assay (FDA approved for NASAL specimens only), is one component of a comprehensive MRSA colonization surveillance program. It is not intended to diagnose MRSA infection nor to guide or monitor treatment for MRSA infections. Test performance is not FDA approved in patients less than 51 years old. Performed at [facility], [place]  [address] [place],  [address]      Labs: BNP (last 3 results) Recent Labs    07/26/21 1601 08/04/21 1350 12/25/21 2110  BNP 1,291.0* 942.4* 7,616.0*   Basic Metabolic Panel: Recent Labs  Lab 12/26/21 0336 12/27/21 0434 12/28/21 0249 12/29/21 0457 12/30/21 0553  NA 137 136 137 138 136  K 4.5 3.4* 3.3* 3.5 4.0  CL 104 103 103 104 104  CO2 23 26 23 24 25   GLUCOSE 225* 117* 160* 126* 105*  BUN 39* 55* 49* 43* 38*  CREATININE 2.68* 2.36* 2.24* 1.96* 2.06*  CALCIUM 8.3* 8.1* 8.1* 8.3* 8.2*  MG 2.0 2.0  --  2.2  --   PHOS 4.5  --   --   --   --    Liver Function Tests: Recent Labs  Lab 12/25/21 2110 12/26/21 0336  AST 17 18  ALT 12 13  ALKPHOS 73 64  BILITOT 0.9 0.7  PROT 7.4 6.7  ALBUMIN 3.1* 2.7*   No results for input(s): LIPASE, AMYLASE in the last 168 hours. No results for input(s): AMMONIA in the last 168 hours. CBC: Recent Labs  Lab 12/25/21 2110 12/26/21 0336  WBC 10.3 7.0  NEUTROABS 9.0*  --   HGB 11.9* 10.5*  HCT 37.2 34.2*  MCV 93.9 94.7  PLT 410* 341   Cardiac Enzymes: No results for input(s): CKTOTAL, CKMB, CKMBINDEX, TROPONINI in the last 168 hours. BNP: Invalid input(s): POCBNP CBG: Recent Labs  Lab 12/29/21 0709 12/29/21 1106 12/29/21 1612 12/29/21 2101 12/30/21 0705  GLUCAP 118* 206* 183* 321* 90   D-Dimer No results for input(s): DDIMER in the last 72 hours. Hgb A1c Recent Labs    12/29/21 0457  HGBA1C 7.7*   Lipid Profile No results for input(s): CHOL, HDL, LDLCALC, TRIG, CHOLHDL, LDLDIRECT in the last 72 hours. Thyroid function  studies No results for input(s): TSH, T4TOTAL, T3FREE, THYROIDAB in the last 72 hours.  Invalid input(s): FREET3 Anemia work up No results for input(s): VITAMINB12, FOLATE, FERRITIN, TIBC, IRON, RETICCTPCT in the last 72 hours. Urinalysis    Component Value Date/Time   COLORURINE AMBER (A) 03/17/2021 1208   APPEARANCEUR CLOUDY (A) 03/17/2021 1208   LABSPEC 1.015 03/17/2021 1208   PHURINE 5.0 03/17/2021 1208   GLUCOSEU 50 (A) 03/17/2021 1208   HGBUR SMALL (A) 03/17/2021 1208   BILIRUBINUR NEGATIVE 03/17/2021 [place] 03/17/2021 1208   PROTEINUR >=300 (A) 03/17/2021 1208   UROBILINOGEN 0.2 01/17/2013 1910   NITRITE NEGATIVE 03/17/2021 1208   LEUKOCYTESUR LARGE (A) 03/17/2021 1208   Sepsis Labs Invalid input(s): PROCALCITONIN,  WBC,  LACTICIDVEN Microbiology Recent Results (from the past 240 hour(s))  Resp Panel by RT-PCR (Flu A&B, Covid) Nasopharyngeal Swab     Status: None   Collection Time: 12/25/21  8:46 PM   Specimen: Nasopharyngeal Swab; Nasopharyngeal(NP) swabs in vial transport medium  Result Value Ref Range Status   SARS Coronavirus 2 by RT PCR NEGATIVE NEGATIVE Final    Comment: (NOTE) SARS-CoV-2 target nucleic acids are NOT DETECTED.  The SARS-CoV-2 RNA is generally detectable in upper respiratory specimens during the acute phase of infection. The lowest concentration of SARS-CoV-2 viral copies this assay can detect is 138 copies/mL. A negative result does not preclude SARS-Cov-2 infection and should not be used as the sole basis for treatment or other patient management decisions. A negative result may occur with  improper specimen collection/handling, submission of specimen other than nasopharyngeal swab, presence of viral mutation(s) within the areas targeted  by this assay, and inadequate number of viral copies(<138 copies/mL). A negative result must be combined with clinical observations, patient history, and epidemiological information. The  expected result is Negative.  Fact Sheet for Patients:  EntrepreneurPulse.com.au  Fact Sheet for Healthcare Providers:  IncredibleEmployment.be  This test is no t yet approved or cleared by the [place] FDA and  has been authorized for detection and/or diagnosis of SARS-CoV-2 by FDA under an Emergency Use Authorization (EUA). This EUA will remain  in effect (meaning this test can be used) for the duration of the COVID-19 declaration under Section 564(b)(1) of the Act, 21 U.S.C.section 360bbb-3(b)(1), unless the authorization is terminated  or revoked sooner.       Influenza A by PCR NEGATIVE NEGATIVE Final   Influenza B by PCR NEGATIVE NEGATIVE Final    Comment: (NOTE) The Xpert Xpress SARS-CoV-2/FLU/RSV plus assay is intended as an aid in the diagnosis of influenza from Nasopharyngeal swab specimens and should not be used as a sole basis for treatment. Nasal washings and aspirates are unacceptable for Xpert Xpress SARS-CoV-2/FLU/RSV testing.  Fact Sheet for Patients: EntrepreneurPulse.com.au  Fact Sheet for Healthcare Providers: IncredibleEmployment.be  This test is not yet approved or cleared by the [place] FDA and has been authorized for detection and/or diagnosis of SARS-CoV-2 by FDA under an Emergency Use Authorization (EUA). This EUA will remain in effect (meaning this test can be used) for the duration of the COVID-19 declaration under Section 564(b)(1) of the Act, 21 U.S.C. section 360bbb-3(b)(1), unless the authorization is terminated or revoked.  Performed at [facility], [address], [place], [place] [address]   MRSA Next Gen by PCR, Nasal     Status: None   Collection Time: 12/26/21  8:07 AM   Specimen: Nasal Mucosa; Nasal Swab  Result Value Ref Range Status   MRSA by PCR Next Gen NOT DETECTED NOT DETECTED Final    Comment: (NOTE) The GeneXpert MRSA Assay (FDA approved for  NASAL specimens only), is one component of a comprehensive MRSA colonization surveillance program. It is not intended to diagnose MRSA infection nor to guide or monitor treatment for MRSA infections. Test performance is not FDA approved in patients less than 19 years old. Performed at [facility], [address] [place], [place] [address]     Time coordinating discharge: 38 mins   SIGNED:  Irwin Brakeman, MD  [facility] 12/30/2021, 10:19 AM How to contact the [facility] Attending or Consulting provider [place] or covering provider during after hours [place], for this patient?  Check the care team in [facility] and look for a) attending/consulting [facility] provider listed and b) the [facility] team listed Log into [email or web address] and use [place] universal password to access. If you do not have the password, please contact the hospital operator. Locate the [facility] provider you are looking for under [facility] and page to a number that you can be directly reached. If you still have difficulty reaching the provider, please page the [facility] (Director on Call) for the Hospitalists listed on amion for assistance.

## 2021-12-30 NOTE — Discharge Instructions (Signed)
IMPORTANT INFORMATION: PAY CLOSE ATTENTION   PHYSICIAN DISCHARGE INSTRUCTIONS  Follow with Primary care provider  Coolidge Breeze, FNP  and other consultants as instructed by your Hospitalist Physician  [place] IF SYMPTOMS COME BACK, WORSEN OR NEW PROBLEM DEVELOPS   Please note: You were cared for by a hospitalist during your hospital stay. Every effort will be made to forward records to your primary care provider.  You can request that your primary care provider send for your hospital records if they have not received them.  Once you are discharged, your primary care physician will handle any further medical issues. Please note that NO REFILLS for any discharge medications will be authorized once you are discharged, as it is imperative that you return to your primary care physician (or establish a relationship with a primary care physician if you do not have one) for your post hospital discharge needs so that they can reassess your need for medications and monitor your lab values.  Please get a complete blood count and chemistry panel checked by your Primary MD at your next visit, and again as instructed by your Primary MD.  Get Medicines reviewed and adjusted: Please take all your medications with you for your next visit with your Primary MD  Laboratory/radiological data: Please request your Primary MD to go over all hospital tests and procedure/radiological results at the follow up, please ask your primary care provider to get all Hospital records sent to his/her office.  In some cases, they will be blood work, cultures and biopsy results pending at the time of your discharge. Please request that your primary care provider follow up on these results.  If you are diabetic, please bring your blood sugar readings with you to your follow up appointment with primary care.    Please call and make your follow up appointments as soon as possible.    Also Note  the following: If you experience worsening of your admission symptoms, develop shortness of breath, life threatening emergency, suicidal or homicidal thoughts you must seek medical attention immediately by calling 911 or calling your MD immediately  if symptoms less severe.  You must read complete instructions/literature along with all the possible adverse reactions/side effects for all the Medicines you take and that have been prescribed to you. Take any new Medicines after you have completely understood and accpet all the possible adverse reactions/side effects.   Do not drive when taking Pain medications or sleeping medications (Benzodiazepines)  Do not take more than prescribed Pain, Sleep and Anxiety Medications. It is not advisable to combine anxiety,sleep and pain medications without talking with your primary care practitioner  Special Instructions: If you have smoked or chewed Tobacco  in the last 2 yrs please stop smoking, stop any regular Alcohol  and or any Recreational drug use.  Wear Seat belts while driving.  Do not drive if taking any narcotic, mind altering or controlled substances or recreational drugs or alcohol.

## 2021-12-30 NOTE — TOC Transition Note (Signed)
Transition of Care [facility]) - CM/SW Discharge Note   Patient Details  Name: Tracey Bryant MRN: [id number] Date of Birth: 1951/09/17  [facility] CM/SW Contact:  Boneta Lucks, RN Phone Number: 12/30/2021, 10:24 AM   Clinical Narrative:   Patient discharging home. TOC called back to ask again about home health. Patient is refusing, she states she has her children.   Final next level of care: Home/Self Care Barriers to Discharge: Continued Medical Work up  Patient Goals and CMS Choice Patient states their goals for this hospitalization and ongoing recovery are:: to go home. CMS Medicare.gov Compare Post Acute Care list provided to:: Patient Choice offered to / list presented to : Patient  Discharge Placement               Patient to be transferred to facility by: Family   Patient and family notified of of transfer: 12/30/21  Discharge Plan and Services      Readmission Risk Interventions Readmission Risk Prevention Plan 12/27/2021 03/19/2021 03/17/2021  Transportation Screening Complete - Complete  PCP or Specialist Appt within 5-7 Days - Complete -  PCP or Specialist Appt within 3-5 Days Complete - -  Home Care Screening - Complete -  Medication Review (RN CM) - - Complete  HRI or Home Care Consult Complete - -  Social Work Consult for [place] Planning/Counseling Complete - -  Palliative Care Screening Not Applicable - -  Medication Review (RN Care Manager) Complete - -  Some recent data might be hidden

## 2021-12-30 NOTE — Progress Notes (Addendum)
° °  See previous note 12/29/21  I have seen pt today She is feeling so much better She says she is planned to go home today - possibly  Chole drain:  no sign of infection ++granulation tissue at tube site and suture sites Tender but no bleeding or infection Dressing with little brown collection noted New dressing placed  Tube flushes and aspirates easily No pain Afeb  OP is bilious Scant in bag  Reassurance to pt Pt is to see Dr Serafina Royals in [facility] next week for procedure Scheduler calling son yesterday/today to make appt. Pt is aware

## 2021-12-30 NOTE — Plan of Care (Signed)

## 2021-12-31 ENCOUNTER — Telehealth: Payer: Self-pay | Admitting: Internal Medicine

## 2021-12-31 NOTE — Telephone Encounter (Signed)
° °  Name: Tracey Bryant  DOB: 1951/12/07  MRN: [id number]  Primary Cardiologist: Primarily Dr. Lovena Le  Chart reviewed as part of pre-operative protocol coverage. Because of [name]'s past medical history and time since last visit, she will require a follow-up visit in order to better assess preoperative cardiovascular risk.  Patient was just discharged from the hospital yesterday with admission for respiratory failure requiring BiPAP, acute on chronic systolic CHF, elevated troponin amongst multiple other medical issues. Therefore, needs formal OV for clearing. Need to expedite ASAP given surgical date upcoming soon.  Pre-op covering staff: - Please schedule appointment and call patient to inform them. If patient already had an upcoming appointment within acceptable timeframe, please add "pre-op clearance" to the appointment notes so provider is aware. - Please contact requesting surgeon's office via preferred method (i.e, phone, fax) to inform them of need for appointment prior to surgery.  This message will also be routed to pharmacy pool for input on holding anticoagulant as requested below so that this information is available to the clearing provider at time of patient's appointment. (Pt is on Eliquis.)  Charlie Pitter, PA-C  12/31/2021, 11:08 AM

## 2021-12-31 NOTE — Telephone Encounter (Signed)
Patient with diagnosis of afib on Eliquis for anticoagulation.    Procedure: Repeat Gallstone Retrieval  Date of procedure: 01/08/22   CHA2DS2-VASc Score = 7   This indicates a 11.2% annual risk of stroke. The patient's score is based upon: CHF History: 1 HTN History: 1 Diabetes History: 1 Stroke History: 2 Vascular Disease History: 0 Age Score: 1 Gender Score: 1      CrCl 22.8 ml/min  Patient is at high risk due to multiple strokes. She has poor renal function, on Eliquis 5mg  BID.   ACC/AHA and Chest guidelines do not recommend bridging with DOAC's. With poor renal function I would advise against bridging.  She previously held Eliquis 2 days.  Per office protocol, patient can hold Eliquis for 2 days prior to procedure.    She should resume as soon as safely possible post procedure.

## 2021-12-31 NOTE — Telephone Encounter (Signed)
I will also send a message to [place], EP scheduler to see if she may be able to get the pt in sooner.

## 2021-12-31 NOTE — Telephone Encounter (Signed)
I tried to reach the pt to schedule an appt for pre op clearance. At this time there are 2 openings on 01/06/22 with Tommye Standard, PAC, 8 am and 8:25 am. Left message to call the office back ASAP in hopes the appts are still available. I will update the requesting office that the pt is going to need an appt for pre op clearance. We will do our best to try and get the pt in before he procedure date 01/08/22.

## 2021-12-31 NOTE — Telephone Encounter (Signed)
Pharm info is now available for provider to review at upcoming clearance appt. No additional preop app input needed at this time.

## 2021-12-31 NOTE — Telephone Encounter (Signed)
° °  Pre-operative Risk Assessment    Patient Name: Tracey Bryant  DOB: 1951-12-14 MRN: [id number]      Request for Surgical Clearance    Procedure:  Repeat Gallstone Retrieval   Date of Surgery:  Clearance 01/08/22                                 Surgeon:  Dr. Serafina Royals Surgeon's Group or Practice Name:  [facility] Phone number:  [phone] Fax number:  [phone]   Type of Clearance Requested:   - Pharmacy:  Hold Apixaban (Eliquis) 2 days   Type of Anesthesia:  Moderate sedation   Additional requests/questions:    Rosalyn Gess   12/31/2021, 8:26 AM

## 2022-01-04 NOTE — Telephone Encounter (Addendum)
EP Scheduler [place] called and left a message for the pt for pre op appt. Per [place] she at this moment can offer the pt appt this afternoon or Thursday at 8 am. I will send new message to requesting office we have left a message for pt to call back for appt. In hopes that the requesting office may s/w the pt, they may let her know she needs to her cardiologist office back for appt.

## 2022-01-04 NOTE — Telephone Encounter (Signed)
EP scheduler Tracey Bryant was able to reach pt's son and pt has been scheduled to see Tracey Bryant, [facility] 01/07/22 at 8 am. I will forward notes to [facility] for upcoming appt. Will let requesting office the pt has appt 01/07/22 for pre op clearance.

## 2022-01-04 NOTE — Telephone Encounter (Signed)
I will send an FYI to requesting office that we are working on trying to get an appt for the pt. See previous notes.

## 2022-01-05 NOTE — Progress Notes (Signed)
Cardiology Office Note Date:  01/07/2022  Patient ID:  Leshae D Doshi, DOB Sep 13, 1951, MRN [id number] PCP:  Coolidge Breeze, FNP  Electrophysiologist: Dr. Lovena Le    Chief Complaint:  pre-op  History of Present Illness: Abriel D Gregg is a 71 y.o. female with history of HTN, HLD, DM, AFib, multiple strokes, PPM, chronic CHF (combined), CKD IV,   She comes in today to be seen for Dr. Lovena Le, last seen by him Aug 2022, to establish care.  She was limited by arthritis, using a walker. Appeared euvolemic, , was maintaining SR, no changes were made.  More recently she saw A quinn, NP for pre-operative clearance percutaneous gallstone fragmentation and mid travel by vascular and interventional radiology. Her [facility] activity status index was 10.7 giving her a functional capacity of 4.06 METS.  Her revised cardiac index score was 3 giving her a perioperative risk of major cardiac event at 11%.  This will be a percutaneous procedure without general anesthetic.  She was cleared to undergo procedure from a cardiac standpoint.  She was hospitalized 12/25/21 - 12/30/21 Worsening SB, had not been taking her furosemide in weeks, only prn. Admitted with acute respiratory failure, initially required BIPAP 2/2 CHF Diuresed, weaned off O2 She still had bilary drain in place, planned for outpt  follow up for this  TTE LVEF 40-45%, global hypokinesis (PRIOR echo 25-30%)  Pending Repeat Gallstone Retrieval with moderate sedation., 01/08/22  TODAY She is accompanied by her son. She has quite a bit of pain with her drain, otherwise doing OK She is very much looking forward to getting closer to the resolution of her GB/gallstone issues, she is very uncomfortable. She denies any ongoing SOB, says she had been taking her lasix though not until just a couple days prior to her admission, did not quite make the connection of her increasing SOB to not using the lasix until then, but was under the impression she only  was to use it PRN, not daily. She dates her "enlarged heart" diagnosis back years Never any known hx of CAD or traditional heart disease, having previously been cared for by a cardiologist in [place], [place].  She is taking all of her medicines, no Eliquis yesterday, today or tomorrow for her procedure. No CP, palpitations or cardiac awareness At baseline she is quite sedentary, uses a walker at home and wheelchair ouut of thehouse for years 2/2 unsteady gait.    No dizzy spells, near syncope or syncope. 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   Past Medical History:  Diagnosis Date   Arthritis    Cardiomyopathy ([place])    Diabetes mellitus without complication ([place])    DVT (deep venous thrombosis) ([place]) 07/26/2021   Hypertension    Paroxysmal atrial fibrillation ([place]) 07/26/2021   Stroke [facility])     Past Surgical History:  Procedure Laterality Date   breast tumor     IR CATHETER TUBE CHANGE  09/25/2021   IR CHOLANGIOGRAM EXISTING TUBE  05/13/2021   IR EXCHANGE BILIARY DRAIN  07/27/2021   IR EXCHANGE BILIARY DRAIN  11/04/2021   IR RADIOLOGIST EVAL & MGMT  09/04/2021   IR REMOVAL OF CALCULI/DEBRIS BILIARY DUCT/GB  09/25/2021   IR REMOVAL OF CALCULI/DEBRIS BILIARY DUCT/GB  11/04/2021   PACEMAKER IMPLANT     TONSILLECTOMY      Current Outpatient Medications  Medication Sig Dispense Refill   acetaminophen (TYLENOL) 500 MG tablet Take 1 tablet (  500 mg total) by mouth every 8 (eight) hours as needed for moderate pain. 20 tablet 0   apixaban (ELIQUIS) 5 MG TABS tablet Take 1 tablet (5 mg total) by mouth 2 (two) times daily. 60 tablet 0   carvedilol (COREG) 6.25 MG tablet Take 1 tablet (6.25 mg total) by mouth 2 (two) times daily with a meal. 60 tablet 0   furosemide (LASIX) 40 MG tablet Take 1 tablet (40 mg total) by mouth daily. 30 tablet 0   glipiZIDE (GLUCOTROL XL) 5 MG 24 hr tablet Take 5 mg by mouth daily.     insulin glargine  (LANTUS) 100 UNIT/ML injection Inject 0.1 mLs (10 Units total) into the skin at bedtime. 10 mL 0   No current facility-administered medications for this visit.    Allergies:   Patient has no known allergies.   Social History:  The patient  reports that she has never smoked. She has never used smokeless tobacco. She reports that she does not drink alcohol and does not use drugs.   Family History:  The patient's family history includes Cancer in her mother; Diabetes in her mother; Heart disease in her father.  ROS:  Please see the history of present illness.    All other systems are reviewed and otherwise negative.   PHYSICAL EXAM:  VS:  BP 124/74    Pulse 62    Ht 5\' 6"  (1.676 m)    Wt 150 lb 12.8 oz (68.4 kg)    SpO2 99%    BMI 24.34 kg/m  BMI: Body mass index is 24.34 kg/m. Well nourished, well developed, in no acute distress, chronically ill appearing HEENT: normocephalic, atraumatic Neck: no JVD, carotid bruits or masses Cardiac:  RRR; no significant murmurs, no rubs, or gallops Lungs:  CTA b/l, no wheezing, rhonchi or rales Abd: soft, nontender, GB drain on R MS: no deformity, advanced atrophy Ext: no edema Skin: warm and dry, no rash Neuro:  No gross deficits appreciated Psych: euthymic mood, full affect  PPM site is stable, no tethering or discomfort   EKG:  Done today and reviewed by myself shows  APaced, V sensed 62bpm, nonspecific T flattening, appears similar to prior  Device interrogation done today and reviewed by myself:  Battery and lead measurements are good NO R waves at 40 today One AMS, 20seconds 3 NSVT (all one second)   12/26/21: TTE IMPRESSIONS   1. Left ventricular ejection fraction, by estimation, is 40 to 45%. The  left ventricle has mildly decreased function. The left ventricle  demonstrates global hypokinesis. Left ventricular diastolic parameters are  indeterminate.   2. Right ventricular systolic function is normal. The right ventricular   size is normal. There is moderately elevated pulmonary artery systolic  pressure. The estimated right ventricular systolic pressure is 60.7 mmHg.   3. Moderate pleural effusion in the left lateral region.   4. The mitral valve is grossly normal. Trivial mitral valve  regurgitation.   5. The aortic valve is tricuspid. Aortic valve regurgitation is not  visualized.   6. The inferior vena cava is normal in size with greater than 50%  respiratory variability, suggesting right atrial pressure of 3 mmHg.   Comparison(s): Changes from prior study are noted. 03/18/2021: LVEF 25-30%,  grade 2 DD, severe LAE, RVSP 36 mmHg.    03/18/2021: TTE IMPRESSIONS   1. Left ventricular ejection fraction, by estimation, is 25 to 30%. The  left ventricle has normal function. The left ventricle demonstrates global  hypokinesis. There is mild left ventricular hypertrophy. Left ventricular  diastolic parameters are  consistent with Grade II diastolic dysfunction (pseudonormalization).  Elevated left atrial pressure.   2. RV poorly visualized, grossly appears enlarged with decreased systolic  function. . Right ventricular systolic function was not well visualized.  The right ventricular size is not well visualized.   3. Left atrial size was severely dilated.   4. The mitral valve is normal in structure. No evidence of mitral valve  regurgitation. No evidence of mitral stenosis.   5. The aortic valve is tricuspid. There is mild calcification of the  aortic valve. There is mild thickening of the aortic valve. Aortic valve  regurgitation is not visualized. No aortic stenosis is present.   6. Mild pulmonary HTN, PASP is 36 mmHg.   7. The inferior vena cava is normal in size with greater than 50%  respiratory variability, suggesting right atrial pressure of 3 mmHg.    Recent Labs: 12/25/2021: B Natriuretic Peptide 3,291.0 12/26/2021: ALT 13; Hemoglobin 10.5; Platelets 341 12/29/2021: Magnesium 2.2 01/07/2022:  BUN 18; Creatinine, Ser 1.63; Potassium 3.8; Sodium 140  04/05/2021: Cholesterol 92; HDL 37; LDL Cholesterol 39; Total CHOL/HDL Ratio 2.5; Triglycerides 82; VLDL 16   Estimated Creatinine Clearance: 30.1 mL/min (A) (by C-G formula based on SCr of 1.63 mg/dL (H)).   Wt Readings from Last 3 Encounters:  01/07/22 150 lb 12.8 oz (68.4 kg)  12/30/21 159 lb 4.8 oz (72.3 kg)  11/04/21 155 lb (70.3 kg)     Other studies reviewed: Additional studies/records reviewed today include: summarized above  ASSESSMENT AND PLAN:  PPM Intact function No programming changes made  Chronic CHF (combined) LVEF has actually improved No symptoms or exam findings of volume OL On BB and lasix, consider ARB at her follow up, pending Creat BMET today  Paroxysmal AFib CHA2DS2Vasc is 7, on Eliquis, appropriately dosed <0.1 % burden  HTN No changes  HLD Not addressed today   Pre-op RCRI score is 3 (11% risk) Low risk procedure Not planned for general anesthesia HF appears compensated, her EF actually improved Discussed with the patient and her son, she has some increased cardiac risk, though feel acceptable for the procedure planned.  Resume Eliquis post procedure as per MD Usual peri-procedure pacemaker management    Disposition: F/u with remotes as usual and in clinic in a month or so, sooner if needed  Current medicines are reviewed at length with the patient today.  The patient did not have any concerns regarding medicines.  Venetia Night, PA-C 01/07/2022 1:05 PM     [address] [place] [address] [phone] (office)  [phone] (fax)

## 2022-01-07 ENCOUNTER — Other Ambulatory Visit: Payer: Self-pay | Admitting: Radiology

## 2022-01-07 ENCOUNTER — Encounter: Payer: Self-pay | Admitting: Physician Assistant

## 2022-01-07 ENCOUNTER — Other Ambulatory Visit: Payer: Self-pay

## 2022-01-07 ENCOUNTER — Other Ambulatory Visit: Payer: Self-pay | Admitting: Student

## 2022-01-07 ENCOUNTER — Ambulatory Visit: Payer: Medicare HMO | Admitting: Physician Assistant

## 2022-01-07 VITALS — BP 124/74 | HR 62 | Ht 66.0 in | Wt 150.8 lb

## 2022-01-07 DIAGNOSIS — I5022 Chronic systolic (congestive) heart failure: Secondary | ICD-10-CM

## 2022-01-07 DIAGNOSIS — Z01818 Encounter for other preprocedural examination: Secondary | ICD-10-CM | POA: Diagnosis not present

## 2022-01-07 DIAGNOSIS — I10 Essential (primary) hypertension: Secondary | ICD-10-CM

## 2022-01-07 DIAGNOSIS — Z950 Presence of cardiac pacemaker: Secondary | ICD-10-CM

## 2022-01-07 DIAGNOSIS — I480 Paroxysmal atrial fibrillation: Secondary | ICD-10-CM

## 2022-01-07 DIAGNOSIS — Z01810 Encounter for preprocedural cardiovascular examination: Secondary | ICD-10-CM | POA: Diagnosis not present

## 2022-01-07 DIAGNOSIS — I5043 Acute on chronic combined systolic (congestive) and diastolic (congestive) heart failure: Secondary | ICD-10-CM | POA: Diagnosis not present

## 2022-01-07 LAB — CUP PACEART INCLINIC DEVICE CHECK
Battery Remaining Longevity: 150 mo
Battery Voltage: 3.04 V
Brady Statistic AP VP Percent: 0.04 %
Brady Statistic AP VS Percent: 95.94 %
Brady Statistic AS VP Percent: 0 %
Brady Statistic AS VS Percent: 4.02 %
Brady Statistic RA Percent Paced: 96.22 %
Brady Statistic RV Percent Paced: 0.04 %
Date Time Interrogation Session: 20230112193004
Implantable Lead Implant Date: 20210524
Implantable Lead Implant Date: 20210524
Implantable Lead Location: 753859
Implantable Lead Location: 753860
Implantable Lead Model: 5076
Implantable Lead Model: 5076
Implantable Pulse Generator Implant Date: 20210524
Lead Channel Impedance Value: 304 Ohm
Lead Channel Impedance Value: 323 Ohm
Lead Channel Impedance Value: 437 Ohm
Lead Channel Impedance Value: 456 Ohm
Lead Channel Pacing Threshold Amplitude: 0.625 V
Lead Channel Pacing Threshold Amplitude: 0.75 V
Lead Channel Pacing Threshold Pulse Width: 0.4 ms
Lead Channel Pacing Threshold Pulse Width: 0.4 ms
Lead Channel Sensing Intrinsic Amplitude: 2.25 mV
Lead Channel Sensing Intrinsic Amplitude: 2.25 mV
Lead Channel Sensing Intrinsic Amplitude: 25.875 mV
Lead Channel Sensing Intrinsic Amplitude: 29.125 mV
Lead Channel Setting Pacing Amplitude: 1.5 V
Lead Channel Setting Pacing Amplitude: 2 V
Lead Channel Setting Pacing Pulse Width: 0.4 ms
Lead Channel Setting Sensing Sensitivity: 0.9 mV

## 2022-01-07 LAB — BASIC METABOLIC PANEL
BUN/Creatinine Ratio: 11 — ABNORMAL LOW (ref 12–28)
BUN: 18 mg/dL (ref 8–27)
CO2: 23 mmol/L (ref 20–29)
Calcium: 8.5 mg/dL — ABNORMAL LOW (ref 8.7–10.3)
Chloride: 107 mmol/L — ABNORMAL HIGH (ref 96–106)
Creatinine, Ser: 1.63 mg/dL — ABNORMAL HIGH (ref 0.57–1.00)
Glucose: 129 mg/dL — ABNORMAL HIGH (ref 70–99)
Potassium: 3.8 mmol/L (ref 3.5–5.2)
Sodium: 140 mmol/L (ref 134–144)
eGFR: 34 mL/min/{1.73_m2} — ABNORMAL LOW (ref >59)

## 2022-01-07 NOTE — Patient Instructions (Signed)
Medication Instructions:    Your physician recommends that you continue on your current medications as directed. Please refer to the Current Medication list given to you today.  *If you need a refill on your cardiac medications before your next appointment, please call your pharmacy*   Lab Work:  BMET TODAY   If you have labs (blood work) drawn today and your tests are completely normal, you will receive your results only by: [place] (if you have MyChart) OR A paper copy in the mail If you have any lab test that is abnormal or we need to change your treatment, we will call you to review the results.   Testing/Procedures: NONE ORDERED  TODAY    Follow-Up: At [facility], you and your health needs are our priority.  As part of our continuing mission to provide you with exceptional heart care, we have created designated Provider Care Teams.  These Care Teams include your primary Cardiologist (physician) and Advanced Practice Providers (APPs -  Physician Assistants and Nurse Practitioners) who all work together to provide you with the care you need, when you need it.  We recommend signing up for the patient portal called "MyChart".  Sign up information is provided on this After Visit Summary.  MyChart is used to connect with patients for Virtual Visits (Telemedicine).  Patients are able to view lab/test results, encounter notes, upcoming appointments, etc.  Non-urgent messages can be sent to your provider as well.   To learn more about what you can do with MyChart, go to [email or web address].    Your next appointment:  IN [place]  4-6 week(s)  The format for your next appointment:   In Person  Provider:   Cristopher Peru, MD{     Other Instructions

## 2022-01-08 ENCOUNTER — Other Ambulatory Visit (HOSPITAL_COMMUNITY): Payer: Self-pay | Admitting: Interventional Radiology

## 2022-01-08 ENCOUNTER — Ambulatory Visit (HOSPITAL_COMMUNITY)
Admission: RE | Admit: 2022-01-08 | Discharge: 2022-01-08 | Disposition: A | Discharge status: Home / Self Care | Payer: Medicare HMO | Source: Ambulatory Visit | Attending: Interventional Radiology | Admitting: Interventional Radiology

## 2022-01-08 ENCOUNTER — Other Ambulatory Visit: Payer: Self-pay | Admitting: Radiology

## 2022-01-08 DIAGNOSIS — K8020 Calculus of gallbladder without cholecystitis without obstruction: Secondary | ICD-10-CM | POA: Diagnosis not present

## 2022-01-08 DIAGNOSIS — Z8673 Personal history of transient ischemic attack (TIA), and cerebral infarction without residual deficits: Secondary | ICD-10-CM | POA: Insufficient documentation

## 2022-01-08 DIAGNOSIS — N189 Chronic kidney disease, unspecified: Secondary | ICD-10-CM | POA: Diagnosis not present

## 2022-01-08 DIAGNOSIS — E1122 Type 2 diabetes mellitus with diabetic chronic kidney disease: Secondary | ICD-10-CM | POA: Diagnosis not present

## 2022-01-08 DIAGNOSIS — E785 Hyperlipidemia, unspecified: Secondary | ICD-10-CM | POA: Diagnosis not present

## 2022-01-08 DIAGNOSIS — Z434 Encounter for attention to other artificial openings of digestive tract: Secondary | ICD-10-CM | POA: Diagnosis present

## 2022-01-08 DIAGNOSIS — I130 Hypertensive heart and chronic kidney disease with heart failure and stage 1 through stage 4 chronic kidney disease, or unspecified chronic kidney disease: Secondary | ICD-10-CM | POA: Diagnosis not present

## 2022-01-08 HISTORY — PX: IR CATHETER TUBE CHANGE: IMG717

## 2022-01-08 HISTORY — PX: IR EXCHANGE BILIARY DRAIN: IMG6046

## 2022-01-08 HISTORY — PX: IR REMOVAL OF CALCULI/DEBRIS BILIARY DUCT/GB: IMG6054

## 2022-01-08 LAB — BASIC METABOLIC PANEL
Anion gap: 6 (ref 5–15)
BUN: 14 mg/dL (ref 8–23)
CO2: 23 mmol/L (ref 22–32)
Calcium: 8 mg/dL — ABNORMAL LOW (ref 8.9–10.3)
Chloride: 108 mmol/L (ref 98–111)
Creatinine, Ser: 1.63 mg/dL — ABNORMAL HIGH (ref 0.44–1.00)
GFR, Estimated: 34 mL/min — ABNORMAL LOW (ref >60)
Glucose, Bld: 128 mg/dL — ABNORMAL HIGH (ref 70–99)
Potassium: 6.3 mmol/L (ref 3.5–5.1)
Sodium: 137 mmol/L (ref 135–145)

## 2022-01-08 LAB — CBC
HCT: 39.4 % (ref 36.0–46.0)
Hemoglobin: 12.8 g/dL (ref 12.0–15.0)
MCH: 29.8 pg (ref 26.0–34.0)
MCHC: 32.5 g/dL (ref 30.0–36.0)
MCV: 91.8 fL (ref 80.0–100.0)
Platelets: 357 10*3/uL (ref 150–400)
RBC: 4.29 MIL/uL (ref 3.87–5.11)
RDW: 15.1 % (ref 11.5–15.5)
WBC: 7.6 10*3/uL (ref 4.0–10.5)
nRBC: 0 % (ref 0.0–0.2)

## 2022-01-08 LAB — POCT I-STAT, CHEM 8
BUN: 14 mg/dL (ref 8–23)
Calcium, Ion: 1.12 mmol/L — ABNORMAL LOW (ref 1.15–1.40)
Chloride: 108 mmol/L (ref 98–111)
Creatinine, Ser: 1.5 mg/dL — ABNORMAL HIGH (ref 0.44–1.00)
Glucose, Bld: 124 mg/dL — ABNORMAL HIGH (ref 70–99)
HCT: 36 % (ref 36.0–46.0)
Hemoglobin: 12.2 g/dL (ref 12.0–15.0)
Potassium: 3.6 mmol/L (ref 3.5–5.1)
Sodium: 143 mmol/L (ref 135–145)
TCO2: 22 mmol/L (ref 22–32)

## 2022-01-08 LAB — PROTIME-INR
INR: 0.9 (ref 0.8–1.2)
Prothrombin Time: 12.2 seconds (ref 11.4–15.2)

## 2022-01-08 LAB — GLUCOSE, CAPILLARY
Glucose-Capillary: 113 mg/dL — ABNORMAL HIGH (ref 70–99)
Glucose-Capillary: 86 mg/dL (ref 70–99)

## 2022-01-08 IMAGING — XA IR REMOVE CALCULI/DEBRIS BILIARY DUCT/GB
11 of 14 series · 14 of 20 positions shown · non-contrast
Comparison: none

INDICATION: 70-year-old female with history of acute calculus cholecystitis
status post percutaneous cholecystostomy tube placement on
[DATE]. The patient has been deemed a poor surgical candidate
due to comorbidities, therefore presents for third session (prior on
[DATE] and [DATE]) of choledochoscope-assisted percutaneous
gallstone retrieval.

[Series 1: fl (-) angio · 1 of 1 slices shown (1 of 5)]
[im 1/1]
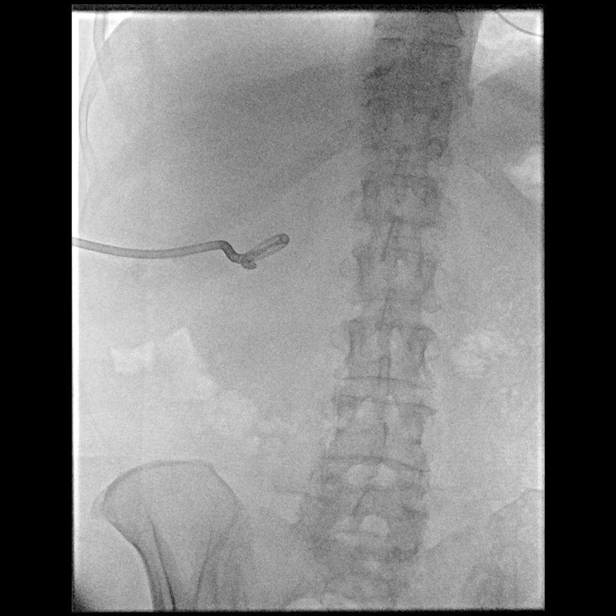

[Series 2: fl (-) angio · 2 of 68 frames shown (2 of 5)]
[frame 30/68]
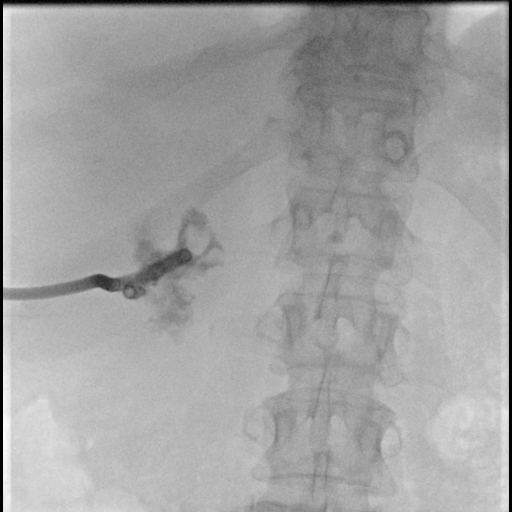
[frame 35/68]
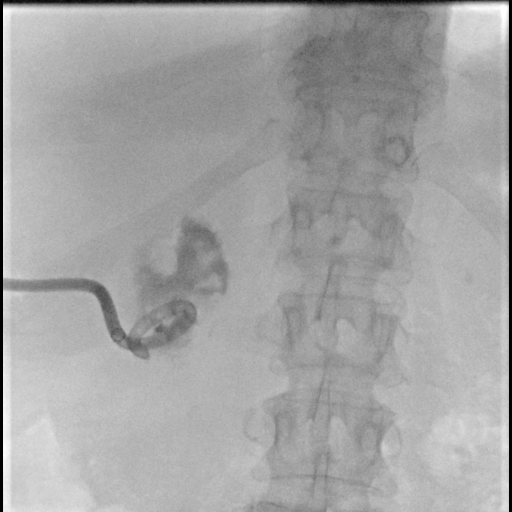

[Series 3: single · 1 of 1 slices shown (1 of 6)]
[im 1/1]
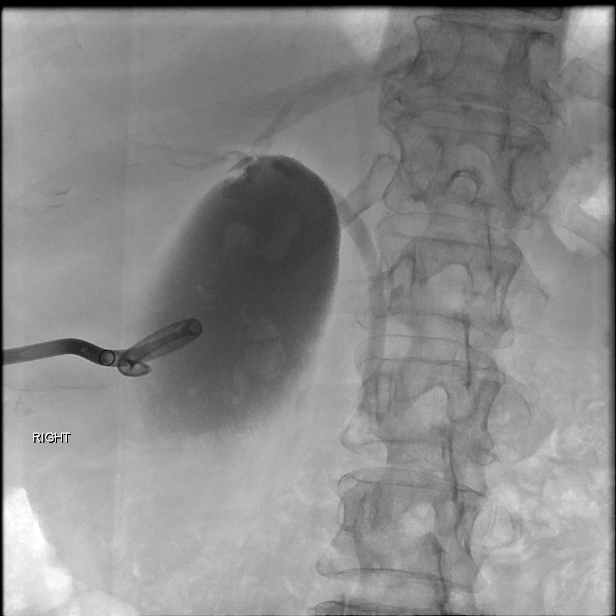

[Series 4: single · 1 of 1 slices shown (2 of 6)]
[im 1/1]
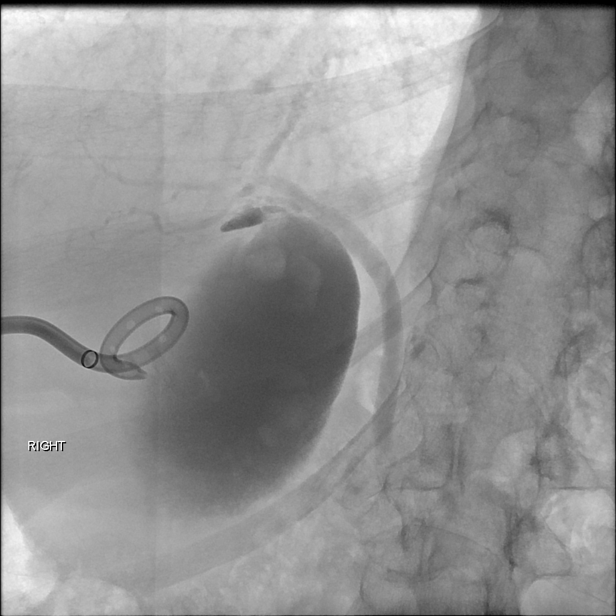

[Series 5: single · 1 of 1 slices shown (3 of 6)]
[im 1/1]
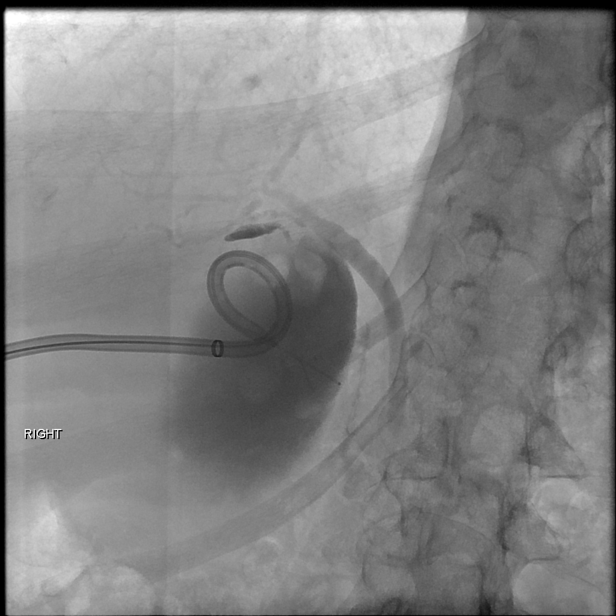

[Series 7: fl (-) angio · 3 of 89 frames shown (3 of 5)]
[frame 14/89]
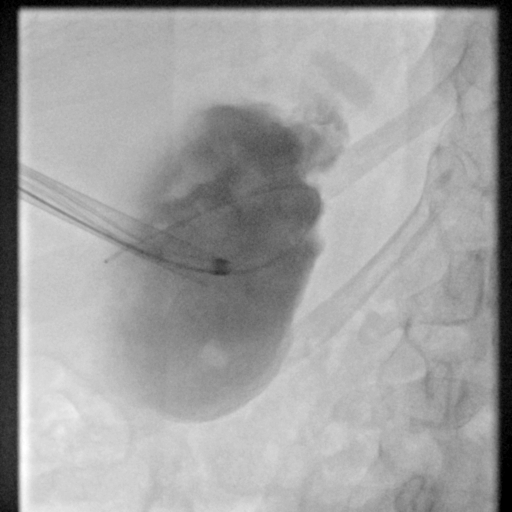
[frame 45/89]
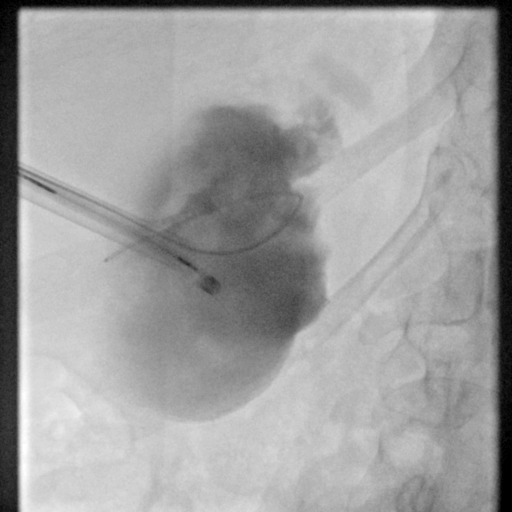
[frame 76/89]
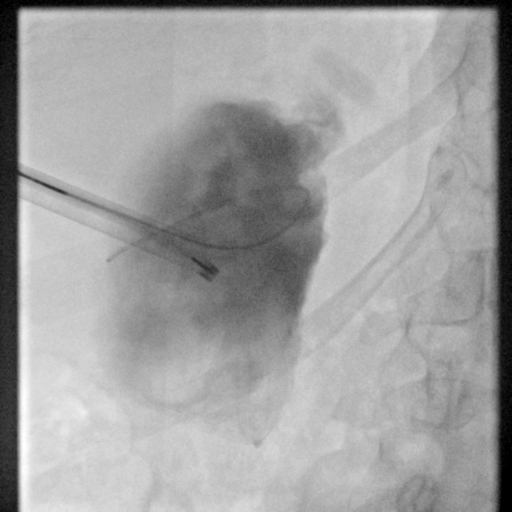

[Series 8: single · 1 of 1 slices shown (4 of 6)]
[im 1/1]
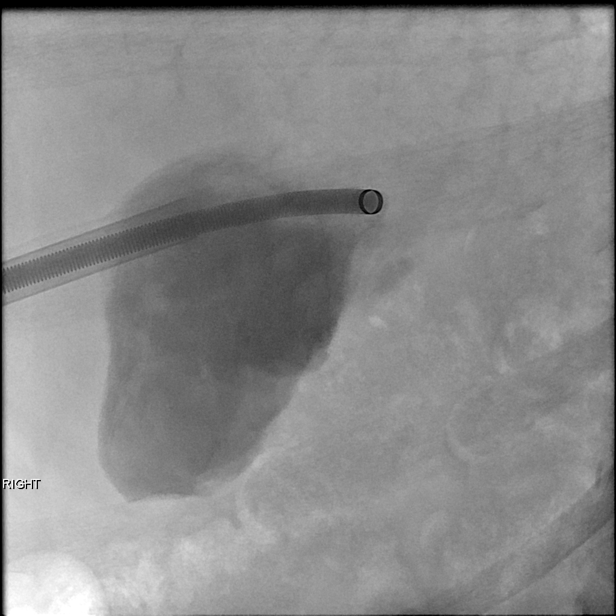

[Series 10: single · 1 of 1 slices shown (5 of 6)]
[im 1/1]
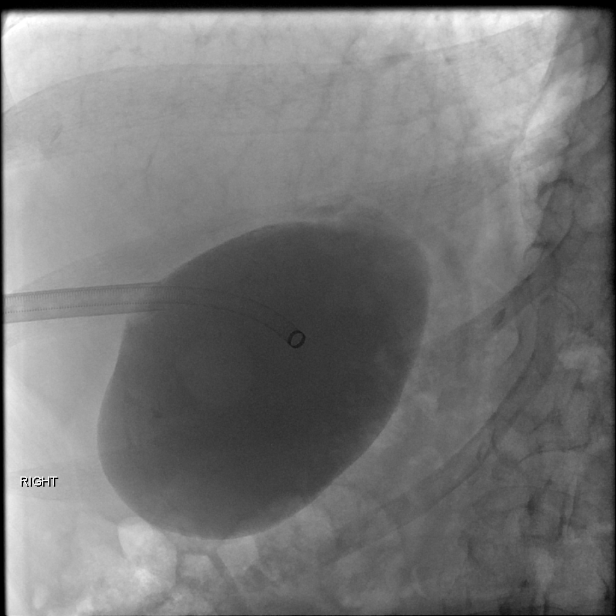

[Series 11: single · 1 of 1 slices shown (6 of 6)]
[im 1/1]
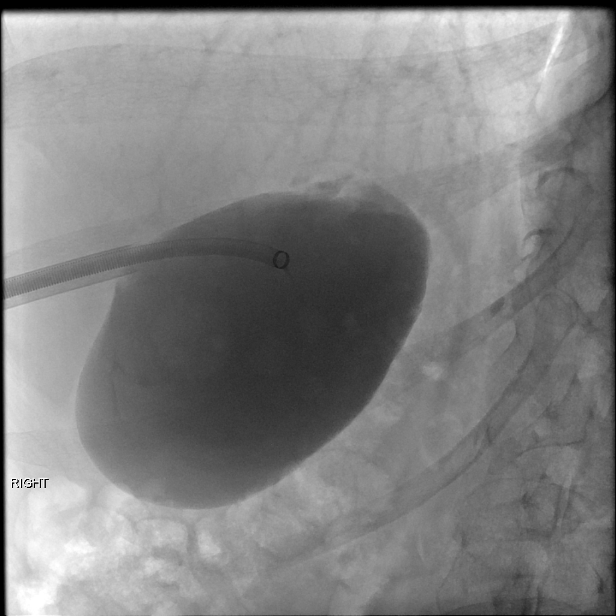

[Series 12: fl (-) angio · 1 of 1 slices shown (4 of 5)]
[im 1/1]
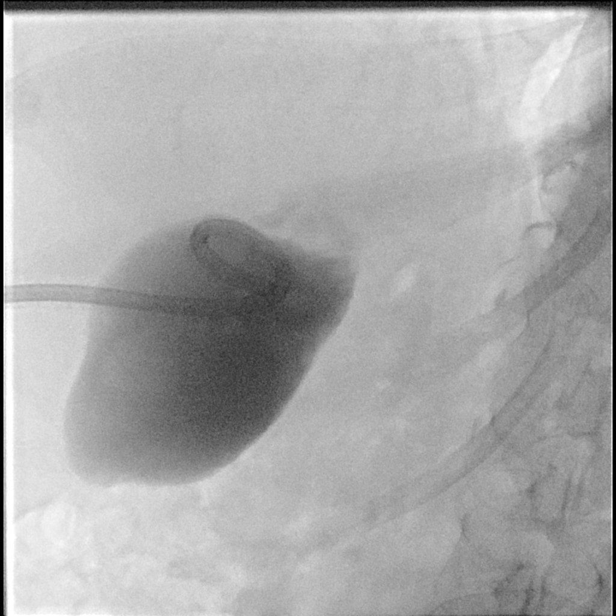

[Series 14: fl (-) angio · 1 of 1 slices shown (5 of 5)]
[im 1/1]
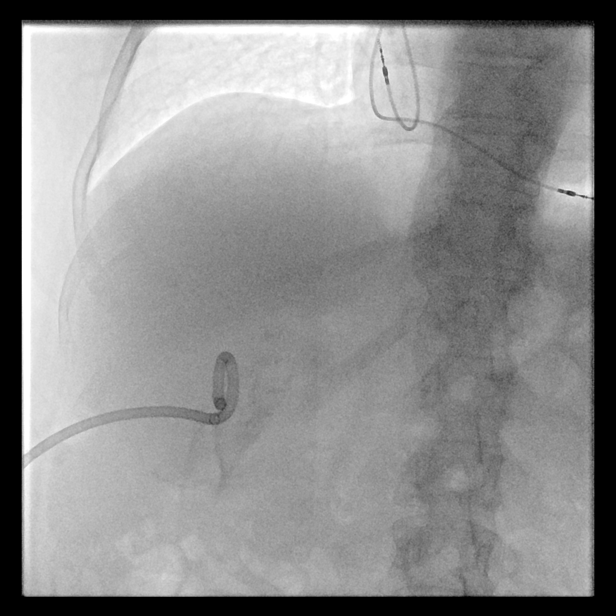

[14 of 20 positions shown; findings below may reference images not displayed]

EXAM:
1. Cholecystogram.
2. Choledochoscope assisted percutaneous gallstone retrieval.
3. Cholecystostomy tube exchange.

MEDICATIONS:
Cefoxitin, 2 g, intravenous; The antibiotic was administered within
an appropriate time frame prior to the initiation of the procedure.

ANESTHESIA/SEDATION:
Moderate (conscious) sedation was employed during this procedure. A
total of Versed 2.5 mg and Fentanyl 100 mcg was administered
intravenously.

Moderate Sedation Time: 90 minutes. The patient's level of
consciousness and vital signs were monitored continuously by
radiology nursing throughout the procedure under my direct
supervision.

FLUOROSCOPY TIME:  Fluoroscopy Time: 28 minutes 19 seconds (596
mGy).

COMPLICATIONS:
None immediate.

PROCEDURE:
Informed written consent was obtained from the patient after a
thorough discussion of the procedural risks, benefits and
alternatives. All questions were addressed. Maximal Sterile Barrier
Technique was utilized including caps, mask, sterile gowns, sterile
gloves, sterile drape, hand hygiene and skin antiseptic. A timeout
was performed prior to the initiation of the procedure.

The indwelling right cholecystostomy tube was prepped and draped in
standard fashion. Preprocedure scout image demonstrates the tube in
appropriate position in the right upper quadrant. Gentle hand
injection of contrast demonstrates multifocal filling defects in the
gallbladder lumen consistent with fragments of prior gallstone. The
cystic duct is visualized after distension of the gallbladder.
Subdermal Local anesthesia was provided at the tube entry site with
1% lidocaine. The indwelling drain was cut to release the pigtail
string and exchanged over an Amplatz wire for a 12 French angled tip
braided sheath. An angled tip 90 cross catheter and Glidewire were
then used to attempt to cannulate cystic duct. The peripheral aspect
of the cystic duct was successfully cannulated, however the haustral
were unable to be traversed in the midportion. Therefore, the
catheter wire were removed and the indwelling 12 French sheath was
exchanged for a 20 French peel-away sheath over an Amplatz wire.
Through the peel-away sheath, 2.2 cm diameter Whittich stone
retrieval basket was inserted in multiple sweeps were performed
yielding several moderate size gallstone fragments. Copious
irrigation was administered to flush the gallbladder lumen. The
spyglass discover scope was inserted which demonstrated a moderate
amount of gallbladder sludge and a few tiny intraluminal gallstones.
Additional bowel skin sweeping with removal of few small stones and
sludge was performed followed by an additional session of copious
irrigation. Contrast opacification of the gallbladder lumen was then
performed which demonstrated faint opacification of the cystic duct
and common bile duct with a few tiny small filling defects
throughout the gallbladder lumen consistent a few small residual
gallstones. The indwelling sheath was exchanged over an Amplatz wire
for a 14 French pigtail drain. The pigtail portion was formed in the
gallbladder lumen. The catheter was sutured at the skin entry site
with a 0 silk suture. A sterile bandage was applied. The patient
tolerated the procedure well without immediate complication.
IMPRESSION: 1. Moderate residual cholelithiasis with patent cystic duct.
2. Technically successful choledochoscope-assisted percutaneous
gallstone retrieval, now with approximately 90% resolution of
original gallstone burden. Faint opacification of the cystic duct
upon completion with inability to cannulate with catheter and wire.
3. Technically successful cholecystostomy tube exchange, placed to
bag drainage.

PLAN:
Return in 4 weeks for cholecystostomy tube check. Further procedural
plans will be discussed upon findings at this time.

## 2022-01-08 MED ORDER — SODIUM CHLORIDE 0.9% FLUSH: 5.0000 mL | Freq: Three times a day (TID) | INTRAVENOUS | Status: DC

## 2022-01-08 MED ORDER — FENTANYL CITRATE (PF) 100 MCG/2ML IJ SOLN
INTRAMUSCULAR | Status: AC | PRN
  Administered 2022-01-08 (×2): 25 ug via INTRAVENOUS
  Administered 2022-01-08: 50 ug via INTRAVENOUS

## 2022-01-08 MED ORDER — SODIUM CHLORIDE 0.9 % IV SOLN
INTRAVENOUS | Status: AC | PRN
  Administered 2022-01-08: 10 mL/h via INTRAVENOUS

## 2022-01-08 MED ORDER — IOHEXOL 300 MG/ML  SOLN
100.0000 mL | Freq: Once | INTRAMUSCULAR | Status: AC | PRN
  Administered 2022-01-08: 35 mL

## 2022-01-08 MED ORDER — SODIUM CHLORIDE 0.9 % IV SOLN: INTRAVENOUS | Status: DC

## 2022-01-08 MED ORDER — SODIUM CHLORIDE 0.9 % IV SOLN
2.0000 g | INTRAVENOUS | Status: AC
  Administered 2022-01-08: 2 g via INTRAVENOUS
  Filled 2022-01-08 (×2): qty 2

## 2022-01-08 MED ORDER — LIDOCAINE HCL 1 % IJ SOLN
INTRAMUSCULAR | Status: AC
  Administered 2022-01-08: 10 mL
  Filled 2022-01-08: qty 20

## 2022-01-08 MED ORDER — MIDAZOLAM HCL 5 MG/5ML IJ SOLN
INTRAMUSCULAR | Status: AC | PRN
  Administered 2022-01-08: .5 mg via INTRAVENOUS

## 2022-01-08 MED ORDER — MIDAZOLAM HCL 2 MG/2ML IJ SOLN
INTRAMUSCULAR | Status: AC | PRN
  Administered 2022-01-08: .5 mg via INTRAVENOUS
  Administered 2022-01-08: 1 mg via INTRAVENOUS
  Administered 2022-01-08: .5 mg via INTRAVENOUS

## 2022-01-08 MED ORDER — FENTANYL CITRATE (PF) 100 MCG/2ML IJ SOLN
INTRAMUSCULAR | Status: AC
  Filled 2022-01-08: qty 4

## 2022-01-08 MED ORDER — MIDAZOLAM HCL 2 MG/2ML IJ SOLN
INTRAMUSCULAR | Status: AC
  Filled 2022-01-08: qty 4

## 2022-01-08 NOTE — Sedation Documentation (Signed)
At 0856 notified by lab of Se K of 6.3, slightly hemolyzed. K yesterday was 3.8. Dr Serafina Royals notified at [phone]. To do istat chem 8. Done and Se K is 3.6, Dr Serafina Royals aware.

## 2022-01-08 NOTE — H&P (Signed)
Chief Complaint: Acute calculi cholecystitis. Patient presents for 3rd session of glass stone retrieval.  Referring Physician(s): Suzette Battiest  Supervising Physician: Ruthann Cancer  Patient Status: [facility] - Out-pt  History of Present Illness: Tracey Bryant is a 71 y.o. female  History of HTN, HLD, DM, CVA, CHF, CKD,  acute calculus cholecystitis. Patient was deemed not to be a surgical candidate. IR placed a  cholestostomy tube on 3.24.22. Patient has been under going gallstone retrieval with her first session on 9.30.22 and her second on 11.9.22. Per procedure notes 50% of the residual indwelling gallstone fragments were removed during the second session. Unfortunately the cystic duct continues to be non patent. Patient presents for her 3rd gallstone retrieval session.  Currently without any significant complaints. Patient alert and laying in bed, calm and comfortable. Denies any fevers, headache, chest pain, SOB, cough, abdominal pain, nausea, vomiting or bleeding. Return precautions and treatment recommendations and follow-up discussed with the patient who is agreeable with the plan.    Past Medical History:  Diagnosis Date   Arthritis    Cardiomyopathy ([place])    Diabetes mellitus without complication ([place])    DVT (deep venous thrombosis) ([place]) 07/26/2021   Hypertension    Paroxysmal atrial fibrillation ([place]) 07/26/2021   Stroke [facility])     Past Surgical History:  Procedure Laterality Date   breast tumor     IR CATHETER TUBE CHANGE  09/25/2021   IR CHOLANGIOGRAM EXISTING TUBE  05/13/2021   IR EXCHANGE BILIARY DRAIN  07/27/2021   IR EXCHANGE BILIARY DRAIN  11/04/2021   IR RADIOLOGIST EVAL & MGMT  09/04/2021   IR REMOVAL OF CALCULI/DEBRIS BILIARY DUCT/GB  09/25/2021   IR REMOVAL OF CALCULI/DEBRIS BILIARY DUCT/GB  11/04/2021   PACEMAKER IMPLANT     TONSILLECTOMY      Allergies: Patient has no known allergies.  Medications: Prior to Admission medications   Medication Sig Start  Date End Date Taking? Authorizing Provider  acetaminophen (TYLENOL) 500 MG tablet Take 1 tablet (500 mg total) by mouth every 8 (eight) hours as needed for moderate pain. 04/08/21 04/08/22  [name], MD  apixaban (ELIQUIS) 5 MG TABS tablet Take 1 tablet (5 mg total) by mouth 2 (two) times daily. 08/04/21   Pattricia Boss, MD  carvedilol (COREG) 6.25 MG tablet Take 1 tablet (6.25 mg total) by mouth 2 (two) times daily with a meal. 08/01/21   [name], MD  furosemide (LASIX) 40 MG tablet Take 1 tablet (40 mg total) by mouth daily. 12/30/21   Johnson, Clanford L, MD  glipiZIDE (GLUCOTROL XL) 5 MG 24 hr tablet Take 5 mg by mouth daily. 11/03/21   [provider]  insulin glargine (LANTUS) 100 UNIT/ML injection Inject 0.1 mLs (10 Units total) into the skin at bedtime. 12/30/21   Murlean Iba, MD     Family History  Problem Relation Age of Onset   Cancer Mother    Diabetes Mother    Heart disease Father     Social History   Socioeconomic History   Marital status: Divorced    Spouse name: Not on file   Number of children: Not on file   Years of education: Not on file   Highest education level: Not on file  Occupational History   Not on file  Tobacco Use   Smoking status: Never   Smokeless tobacco: Never  Vaping Use   Vaping Use: Never used  Substance and Sexual Activity   Alcohol use:  No   Drug use: Never   Sexual activity: Not on file  Other Topics Concern   Not on file  Social History Narrative   Not on file   Social Determinants of Health   Financial Resource Strain: Not on file  Food Insecurity: Not on file  Transportation Needs: Not on file  Physical Activity: Not on file  Stress: Not on file  Social Connections: Not on file     Review of Systems: A 12 point ROS discussed and pertinent positives are indicated in the HPI above.  All other systems are negative.  Review of Systems  Constitutional:  Negative for fatigue and fever.  HENT:   Negative for congestion.   Respiratory:  Negative for cough and shortness of breath.   Gastrointestinal:  Negative for abdominal pain, diarrhea, nausea and vomiting.   Vital Signs: BP (!) 161/84    Pulse 67    Temp 97.9 F (36.6 C) (Oral)    Resp 20    Ht 5\' 6"  (1.676 m)    Wt 150 lb (68 kg)    SpO2 100%    BMI 24.21 kg/m   Physical Exam Vitals and nursing note reviewed.  Constitutional:      Appearance: She is well-developed.  HENT:     Head: Normocephalic and atraumatic.  Eyes:     Conjunctiva/sclera: Conjunctivae normal.  Cardiovascular:     Rate and Rhythm: Normal rate and regular rhythm.     Heart sounds: Normal heart sounds.  Pulmonary:     Effort: Pulmonary effort is normal.     Breath sounds: Normal breath sounds.  Abdominal:     Comments: Positive RUQ drain to gravity bag.  Some leaking noted to be around the exit site otherwise unremarkable with no erythema, edema, tenderness, bleeding.  Dark brown ourput noted to be in the line of drain.    Musculoskeletal:        General: Normal range of motion.     Cervical back: Normal range of motion.  Skin:    General: Skin is warm.  Neurological:     Mental Status: She is alert and oriented to person, place, and time.    Imaging: DG Chest Port 1 View  Result Date: 12/25/2021 CLINICAL DATA:  Shortness of breath, CHF. EXAM: PORTABLE CHEST 1 VIEW COMPARISON:  Chest x-ray 08/04/2021. FINDINGS: The heart is enlarged, unchanged. 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 There are small bilateral pleural effusions. There are central interstitial opacities bilaterally. There are some patchy opacities in both lung bases. There is no evidence for pneumothorax or acute fracture. IMPRESSION: 1. Cardiomegaly with mild interstitial edema and small pleural effusions. Electronically Signed   By: Ronney Asters M.D.   On: 12/25/2021 21:32   ECHOCARDIOGRAM COMPLETE  Result Date: 12/26/2021    ECHOCARDIOGRAM REPORT   Patient Name:   Tracey Bryant Date of Exam: 12/26/2021 Medical Rec #:  [id number]       Height:       66.0 in Accession #:    [id number]      Weight:       166.9 lb Date of Birth:  July 05, 1951       BSA:          1.852 m Patient Age:    30 years        BP:           132/64 mmHg Patient Gender: F  HR:           64 bpm. Exam Location:  [facility] Procedure: 2D Echo, Cardiac Doppler and Color Doppler Indications:    CHF-Acute Systolic N62.95  History:        Patient has prior history of Echocardiogram examinations, most                 recent 03/18/2021. Cardiomyopathy, Stroke, Arrythmias:Atrial                 Fibrillation; Risk Factors:Hypertension and Diabetes.  Sonographer:    Bernadene Person RDCS Referring Phys: [id number] OLADAPO ADEFESO IMPRESSIONS  1. Left ventricular ejection fraction, by estimation, is 40 to 45%. The left ventricle has mildly decreased function. The left ventricle demonstrates global hypokinesis. Left ventricular diastolic parameters are indeterminate.  2. Right ventricular systolic function is normal. The right ventricular size is normal. There is moderately elevated pulmonary artery systolic pressure. The estimated right ventricular systolic pressure is 40.1 mmHg.  3. Moderate pleural effusion in the left lateral region.  4. The mitral valve is grossly normal. Trivial mitral valve regurgitation.  5. The aortic valve is tricuspid. Aortic valve regurgitation is not visualized.  6. The inferior vena cava is normal in size with greater than 50% respiratory variability, suggesting right atrial pressure of 3 mmHg. Comparison(s): Changes from prior study are noted. 03/18/2021: LVEF 25-30%, grade 2 DD, severe LAE, RVSP 36 mmHg. FINDINGS  Left Ventricle: Left ventricular ejection fraction, by estimation, is 40 to 45%. The left ventricle has mildly decreased function. The left ventricle demonstrates global hypokinesis. The left ventricular internal cavity size was normal in size. There is  no left ventricular  hypertrophy. Left ventricular diastolic function could not be evaluated due to atrial fibrillation. Left ventricular diastolic parameters are indeterminate. Right Ventricle: The right ventricular size is normal. No increase in right ventricular wall thickness. Right ventricular systolic function is normal. There is moderately elevated pulmonary artery systolic pressure. The tricuspid regurgitant velocity is 3.27 m/s, and with an assumed right atrial pressure of 3 mmHg, the estimated right ventricular systolic pressure is 02.7 mmHg. Left Atrium: Left atrial size was normal in size. Right Atrium: Right atrial size was normal in size. Pericardium: There is no evidence of pericardial effusion. Mitral Valve: The mitral valve is grossly normal. Trivial mitral valve regurgitation. Tricuspid Valve: The tricuspid valve is grossly normal. Tricuspid valve regurgitation is trivial. Aortic Valve: The aortic valve is tricuspid. Aortic valve regurgitation is not visualized. Pulmonic Valve: The pulmonic valve was normal in structure. Pulmonic valve regurgitation is not visualized. Aorta: The aortic root and ascending aorta are structurally normal, with no evidence of dilitation. Venous: The inferior vena cava is normal in size with greater than 50% respiratory variability, suggesting right atrial pressure of 3 mmHg. IAS/Shunts: No atrial level shunt detected by color flow Doppler. Additional Comments: There is a moderate pleural effusion in the left lateral region.  LEFT VENTRICLE PLAX 2D LVIDd:         4.50 cm      Diastology LVIDs:         3.10 cm      LV e' medial:    5.35 cm/s LV PW:         1.00 cm      LV E/e' medial:  27.7 LV IVS:        1.00 cm      LV e' lateral:   7.09 cm/s LVOT diam:     1.90 cm  LV E/e' lateral: 20.9 LV SV:         48 LV SV Index:   26 LVOT Area:     2.84 cm  LV Volumes (MOD) LV vol d, MOD A2C: 125.0 ml LV vol d, MOD A4C: 119.0 ml LV vol s, MOD A2C: 76.7 ml LV vol s, MOD A4C: 69.1 ml LV SV MOD A2C:      48.3 ml LV SV MOD A4C:     119.0 ml LV SV MOD BP:      48.8 ml RIGHT VENTRICLE TAPSE (M-mode): 1.1 cm LEFT ATRIUM             Index        RIGHT ATRIUM           Index LA diam:        4.10 cm 2.21 cm/m   RA Area:     17.80 cm LA Vol (A2C):   60.5 ml 32.67 ml/m  RA Volume:   45.70 ml  24.68 ml/m LA Vol (A4C):   58.2 ml 31.43 ml/m LA Biplane Vol: 60.8 ml 32.83 ml/m  AORTIC VALVE LVOT Vmax:   89.73 cm/s LVOT Vmean:  57.133 cm/s LVOT VTI:    0.170 m  AORTA Ao Root diam: 2.90 cm Ao Asc diam:  3.20 cm MITRAL VALVE                TRICUSPID VALVE MV Area (PHT): 5.27 cm     TR Peak grad:   42.8 mmHg MV Decel Time: 144 msec     TR Vmax:        327.00 cm/s MV E velocity: 148.00 cm/s MV A velocity: 28.40 cm/s   SHUNTS MV E/A ratio:  5.21         Systemic VTI:  0.17 m                             Systemic Diam: 1.90 cm Lyman Bishop MD Electronically signed by Lyman Bishop MD Signature Date/Time: 12/26/2021/3:04:07 PM    Final    CUP PACEART INCLINIC DEVICE CHECK  Result Date: 01/07/2022 Pacemaker check in clinic. Normal device function. Thresholds, sensing, impedances consistent with previous measurements. Device programmed to maximize longevity. one mode switch 3NSVT. Device programmed at appropriate safety margins. Histogram distribution appropriate for patient activity level. Device programmed to optimize intrinsic conduction. Estimated longevity _12.4 years  Patient enrolled in remote follow-up. Patient education completed.   Labs:  CBC: Recent Labs    08/04/21 1350 11/04/21 0818 12/25/21 2110 12/26/21 0336  WBC 9.7 8.6 10.3 7.0  HGB 9.7* 13.7 11.9* 10.5*  HCT 31.7* 42.4 37.2 34.2*  PLT 387 342 410* 341    COAGS: Recent Labs    03/19/21 0504 03/20/21 0147 03/21/21 0115 11/04/21 0818  INR 1.4* 1.6* 1.5* 1.1    BMP: Recent Labs    12/27/21 0434 12/28/21 0249 12/29/21 0457 12/30/21 0553 01/07/22 0913  NA 136 137 138 136 140  K 3.4* 3.3* 3.5 4.0 3.8  CL 103 103 104 104 107*   CO2 26 23 24 25 23   GLUCOSE 117* 160* 126* 105* 129*  BUN 55* 49* 43* 38* 18  CALCIUM 8.1* 8.1* 8.3* 8.2* 8.5*  CREATININE 2.36* 2.24* 1.96* 2.06* 1.63*  GFRNONAA 22* 23* 27* 25*  --     LIVER FUNCTION TESTS: Recent Labs    07/26/21 1601 08/04/21 1350 12/25/21 2110 12/26/21 0336  BILITOT 0.9  0.7 0.9 0.7  AST 17 19 17 18   ALT 10 13 12 13   ALKPHOS 54 55 73 64  PROT 6.5 7.0 7.4 6.7  ALBUMIN 2.9* 2.9* 3.1* 2.7*     Assessment and Plan:  71 y.o. female outpatient. History of HTN, HLD, DM, CVA, CHF, CKD,  acute calculus cholecystitis. Patient was deemed not to be a surgical candidate. IR placed a  cholestostomy tube on 3.24.22. Patient has been under going gallstone retrieval with her first session on 9.30.22 and her second on 11.9.22. Per procedure notes 50% of the residual indwelling gallstone fragments were removed during the second session. Unfortunately the cystic duct continues to be non patent. Patient presents for her 3rd gallstone retrieval session.   Last dose of eliquis on 1.11.23. Cr 1.63, GFR > 34. All labs and medications are within acceptable parameters. NKDA. Patient has been NPO since midnight.  Risks and benefits discussed with the patient including, but not limited to bleeding, infection, gallbladder perforation, bile leak, sepsis or even death.  All of the patient's questions were answered, patient is agreeable to proceed. Consent signed and in chart.   Thank you for this interesting consult.  I greatly enjoyed meeting Tracey Bryant and look forward to participating in their care.  A copy of this report was sent to the requesting provider on this date.  Electronically Signed: Jacqualine Mau, NP 01/08/2022, 7:42 AM   I spent a total of  40 Minutes   in face to face in clinical consultation, greater than 50% of which was counseling/coordinating care for gallstone retrieval procedure.

## 2022-01-08 NOTE — Progress Notes (Signed)
Notified by lab tech that Se potassium is 6.3, slightly hemolyzed at 0856. Se K 3.8 yesterday. Dr Serafina Royals aware. To do istat chem 8. Done at 0914 and result Se K was 3.6Dr Serafina Royals notified

## 2022-01-08 NOTE — Procedures (Signed)
Interventional Radiology Procedure Note  Procedure:  1) Cholecystogram 2) Choledochoscopic-assisted percutaneous gallstone retrieval 3) Cholecystotomy tube exchange  Findings: Please refer to procedural dictation for full description.  Multiple gallstone fragments retrieved, very small volume remains.  Questionable patency of the cystic duct.  Complications: None immediate  Estimated Blood Loss: < 5 mL  Recommendations: Keep drain to bag drainage, flush twice a day. Return in 4 weeks for cholecystogram only.  Future plans will be made at that time to determine additional candidacy for gallstone retrieval versus initiation of capping trial pending results/patency of cystic duct.   Ruthann Cancer, MD Pager: [phone]

## 2022-01-11 ENCOUNTER — Telehealth: Payer: Self-pay | Admitting: *Deleted

## 2022-01-11 ENCOUNTER — Encounter (HOSPITAL_COMMUNITY): Payer: Self-pay

## 2022-01-11 ENCOUNTER — Other Ambulatory Visit (HOSPITAL_COMMUNITY): Payer: Self-pay | Admitting: Interventional Radiology

## 2022-01-11 DIAGNOSIS — K8020 Calculus of gallbladder without cholecystitis without obstruction: Secondary | ICD-10-CM

## 2022-01-11 NOTE — Telephone Encounter (Signed)
Lvm with results and clinic number  to call back  for questions or concerns

## 2022-01-11 NOTE — Telephone Encounter (Signed)
-----   Message from [place], [place] sent at 01/08/2022  5:56 AM EST ----- Creat is improving, continue medicines, keep next appt as scheduled, call if she develops any new swelling, SOB.

## 2022-02-25 ENCOUNTER — Encounter: Payer: Medicare HMO | Admitting: Internal Medicine

## 2022-05-04 ENCOUNTER — Ambulatory Visit: Payer: Medicare HMO | Admitting: Physician Assistant

## 2022-05-04 ENCOUNTER — Encounter: Payer: Self-pay | Admitting: Physician Assistant

## 2022-05-04 VITALS — BP 150/82 | HR 64 | Ht 66.0 in

## 2022-05-04 DIAGNOSIS — I10 Essential (primary) hypertension: Secondary | ICD-10-CM

## 2022-05-04 DIAGNOSIS — I5043 Acute on chronic combined systolic (congestive) and diastolic (congestive) heart failure: Secondary | ICD-10-CM | POA: Diagnosis not present

## 2022-05-04 DIAGNOSIS — Z79899 Other long term (current) drug therapy: Secondary | ICD-10-CM

## 2022-05-04 DIAGNOSIS — I480 Paroxysmal atrial fibrillation: Secondary | ICD-10-CM | POA: Diagnosis not present

## 2022-05-04 DIAGNOSIS — I495 Sick sinus syndrome: Secondary | ICD-10-CM

## 2022-05-04 MED ORDER — HYDRALAZINE HCL 25 MG PO TABS: 25.0000 mg | ORAL_TABLET | Freq: Two times a day (BID) | ORAL | 3 refills | Status: DC

## 2022-05-04 MED ORDER — FUROSEMIDE 40 MG PO TABS
40.0000 mg | ORAL_TABLET | Freq: Two times a day (BID) | ORAL | 3 refills | Status: DC
Start: 2022-05-04 — End: 2022-07-20

## 2022-05-04 MED ORDER — ISOSORBIDE MONONITRATE ER 30 MG PO TB24
15.0000 mg | ORAL_TABLET | Freq: Every day | ORAL | 3 refills | Status: DC
Start: 2022-05-04 — End: 2022-06-09

## 2022-05-04 NOTE — Progress Notes (Signed)
?Cardiology Office Note:   ? ?Date:  05/04/2022  ? ?ID:  Tracey Bryant, DOB 1951/01/24, MRN [id number] ? ?PCP:  Coolidge Breeze, FNP  ?[facility] Cardiologist:  Minus Breeding, MD  ?[facility] Electrophysiologist:  Cristopher Peru, MD  ? ?Chief Complaint: swelling issue ? ?History of Present Illness:   ? ?Tracey Bryant is a 71 y.o. female with a hx of sinus node dysfunction status post Medtronic PPM implant 05/19/2020 , paroxysmal atrial fib, DVT, HTN, CVA, DM2, CKD IIIb and chronic systolic and diastolic heart failure presents for swelling.  ? ?Patient is Jehovah's Witness and refuses blood transfusion. ? ?Hx of sinus node dysfunction status post Medtronic PPM implant 05/19/2020 at [place], [place] in 2021. ?Echo 03/18/21 with LVEF of 25-30%.  ?Initially seen by cardiology service when admitted 07/2021 heart failure exacerbation.  ?Last echo 11/2021 showed LVEF of 40-45%. ? ?Here today for follow up.  Patient reported few weeks history of progressively worsening lower extremity edema, shortness of breath (with and without exertion), orthopnea and PND.  No chest pain, palpitation, dizziness or melena.  Reports compliance with medication and low-sodium diet.  No regular weight check.  No blood pressure check at home. Use Rolator walker for ambulation.  ? ? ? ?Past Medical History:  ?Diagnosis Date  ? Arthritis   ? Cardiomyopathy ([place])   ? Diabetes mellitus without complication ([place])   ? DVT (deep venous thrombosis) ([place]) 07/26/2021  ? Hypertension   ? Paroxysmal atrial fibrillation ([place]) 07/26/2021  ? Stroke [facility])   ? ? ?Past Surgical History:  ?Procedure Laterality Date  ? breast tumor    ? IR CATHETER TUBE CHANGE  09/25/2021  ? IR CHOLANGIOGRAM EXISTING TUBE  05/13/2021  ? IR EXCHANGE BILIARY DRAIN  07/27/2021  ? IR EXCHANGE BILIARY DRAIN  11/04/2021  ? IR EXCHANGE BILIARY DRAIN  01/08/2022  ? IR RADIOLOGIST EVAL & MGMT  09/04/2021  ? IR REMOVAL OF CALCULI/DEBRIS BILIARY DUCT/GB  09/25/2021  ? IR REMOVAL OF CALCULI/DEBRIS  BILIARY DUCT/GB  11/04/2021  ? IR REMOVAL OF CALCULI/DEBRIS BILIARY DUCT/GB  01/08/2022  ? PACEMAKER IMPLANT    ? TONSILLECTOMY    ? ? ?Current Medications: ?Current Meds  ?Medication Sig  ? apixaban (ELIQUIS) 5 MG TABS tablet Take 1 tablet (5 mg total) by mouth 2 (two) times daily.  ? carvedilol (COREG) 6.25 MG tablet Take 1 tablet (6.25 mg total) by mouth 2 (two) times daily with a meal.  ? glipiZIDE (GLUCOTROL XL) 5 MG 24 hr tablet Take 5 mg by mouth daily.  ? [DISCONTINUED] furosemide (LASIX) 40 MG tablet Take 1 tablet (40 mg total) by mouth daily.  ?  ? ?Allergies:   Patient has no known allergies.  ? ?Social History  ? ?Socioeconomic History  ? Marital status: Divorced  ?  Spouse name: Not on file  ? Number of children: Not on file  ? Years of education: Not on file  ? Highest education level: Not on file  ?Occupational History  ? Not on file  ?Tobacco Use  ? Smoking status: Never  ? Smokeless tobacco: Never  ?Vaping Use  ? Vaping Use: Never used  ?Substance and Sexual Activity  ? Alcohol use: No  ? Drug use: Never  ? Sexual activity: Not on file  ?Other Topics Concern  ? Not on file  ?Social History Narrative  ? Not on file  ? ?Social Determinants of Health  ? ?Financial Resource Strain: Not on file  ?Food Insecurity: Not  on file  ?Transportation Needs: Not on file  ?Physical Activity: Not on file  ?Stress: Not on file  ?Social Connections: Not on file  ?  ? ?Family History: ?The patient's family history includes Cancer in her mother; Diabetes in her mother; Heart disease in her father.   ? ?ROS:   ?Please see the history of present illness.    ?All other systems reviewed and are negative.  ? ?EKGs/Labs/Other Studies Reviewed:   ? ?The following studies were reviewed today: ? ?Echo 12/26/2021 ? 1. Left ventricular ejection fraction, by estimation, is 40 to 45%. The  ?left ventricle has mildly decreased function. The left ventricle  ?demonstrates global hypokinesis. Left ventricular diastolic parameters are   ?indeterminate.  ? 2. Right ventricular systolic function is normal. The right ventricular  ?size is normal. There is moderately elevated pulmonary artery systolic  ?pressure. The estimated right ventricular systolic pressure is 16.1 mmHg.  ? 3. Moderate pleural effusion in the left lateral region.  ? 4. The mitral valve is grossly normal. Trivial mitral valve  ?regurgitation.  ? 5. The aortic valve is tricuspid. Aortic valve regurgitation is not  ?visualized.  ? 6. The inferior vena cava is normal in size with greater than 50%  ?respiratory variability, suggesting right atrial pressure of 3 mmHg.  ? ?Comparison(s): Changes from prior study are noted. 03/18/2021: LVEF 25-30%,  ?grade 2 DD, severe LAE, RVSP 36 mmHg.  ? ?EKG:  EKG is not ordered today.  ? ?Recent Labs: ?12/25/2021: B Natriuretic Peptide 3,291.0 ?12/26/2021: ALT 13 ?12/29/2021: Magnesium 2.2 ?01/08/2022: BUN 14; Creatinine, Ser 1.50; Hemoglobin 12.2; Platelets 357; Potassium 3.6; Sodium 143  ?Recent Lipid Panel ?   ?Component Value Date/Time  ? CHOL 92 04/05/2021 0034  ? TRIG 82 04/05/2021 0034  ? HDL 37 (L) 04/05/2021 0034  ? CHOLHDL 2.5 04/05/2021 0034  ? VLDL 16 04/05/2021 0034  ? [place] 39 04/05/2021 0034  ? ? ? ? ?Physical Exam:   ? ?VS:  BP (!) 150/82   Pulse 64   Ht '5\' 6"'$  (1.676 m)   SpO2 95%   BMI 24.21 kg/m?    ? ?Wt Readings from Last 3 Encounters:  ?01/08/22 150 lb (68 kg)  ?01/07/22 150 lb 12.8 oz (68.4 kg)  ?12/30/21 159 lb 4.8 oz (72.3 kg)  ?  ?Vitals:  ? 05/04/22 1537 05/04/22 1600  ?BP: (!) 181/94 (!) 150/82  ?Pulse: 64   ?SpO2: 95%   ?  ? ?GEN:  Well nourished, well developed in no acute distress ?HEENT: Normal ?NECK: No JVD; No carotid bruits ?LYMPHATICS: No lymphadenopathy ?CARDIAC: RRR, no murmurs, rubs, gallops ?RESPIRATORY:  Clear to auscultation without rales, wheezing or rhonchi  ?ABDOMEN: Soft, non-tender, non-distended ?MUSCULOSKELETAL:  1-2+ BL LEedema; No deformity  ?SKIN: Warm and dry ?NEUROLOGIC:  Alert and oriented x  3 ?PSYCHIATRIC:  Normal affect  ? ?ASSESSMENT AND PLAN:  ? ? ?Acute on chronic systolic and diastolic heart failure ?-Echo 03/18/21 with LVEF of 25-30% (long standing hx of CHF while followed at [facility] - no records) ?- Last echo 11/2021 showed LVEF of 40-45%. ?-Evidence of volume overload on exam and by symptoms ?-Check BMP and BNP ?-Increase Lasix to 40 mg twice daily (duration based on labs) ?-Continue low-sodium diet ?-Continue carvedilol 6.2 mg twice daily ?-Add hydralazine and Imdur for afterload reduction ?-No ACE/ARB/Entresto due to CKD ? ?2.  Hypertension ?-Blood pressure elevated ?-Repeat check 150/82 ?-Add hydralazine/Imdur as above.  Continue carvedilol. ? ?3.  Chronic kidney disease  stage IIIb ?-Seems baseline creatinine of 1.6 ?-Check lab ? ?4.  Paroxysmal atrial fibrillation ?-No bleeding issue ?-Continue Eliquis and carvedilol ? ? ?Medication Adjustments/Labs and Tests Ordered: ?Current medicines are reviewed at length with the patient today.  Concerns regarding medicines are outlined above.  ?No orders of the defined types were placed in this encounter. ? ?No orders of the defined types were placed in this encounter. ? ? ?There are no Patient Instructions on file for this visit.  ? ?Signed, ?Leanor Kail, [place]  ?05/04/2022 4:03 PM    ?[place] ?

## 2022-05-04 NOTE — Patient Instructions (Signed)
Medication Instructions:  ?Increase Lasix to 40 mg twice a day  ? ?Start Hydralazine 25 mg twice a day ? ?Start Isosorbide 15 mg daily  ? ?*If you need a refill on your cardiac medications before your next appointment, please call your pharmacy* ? ? ?Lab Work: ?Bmp, Pro Bnp- today  ? ?If you have labs (blood work) drawn today and your tests are completely normal, you will receive your results only by: ?MyChart Message (if you have MyChart) OR ?A paper copy in the mail ?If you have any lab test that is abnormal or we need to change your treatment, we will call you to review the results. ? ? ?Testing/Procedures: ?None ordered  ? ? ?Follow-Up: ?Follow up as scheduled  ? ? ?Other Instructions ?Keep a log of your blood pressures and bring a copy to your next office visit  ? ?Important Information About Sugar ? ? ? ? ?  ?

## 2022-05-05 LAB — BASIC METABOLIC PANEL
BUN/Creatinine Ratio: 8 — ABNORMAL LOW (ref 12–28)
BUN: 18 mg/dL (ref 8–27)
CO2: 20 mmol/L (ref 20–29)
Calcium: 8.8 mg/dL (ref 8.7–10.3)
Chloride: 106 mmol/L (ref 96–106)
Creatinine, Ser: 2.27 mg/dL — ABNORMAL HIGH (ref 0.57–1.00)
Glucose: 139 mg/dL — ABNORMAL HIGH (ref 70–99)
Potassium: 3.7 mmol/L (ref 3.5–5.2)
Sodium: 141 mmol/L (ref 134–144)
eGFR: 23 mL/min/{1.73_m2} — ABNORMAL LOW (ref >59)

## 2022-05-05 LAB — PRO B NATRIURETIC PEPTIDE: NT-Pro BNP: 15044 pg/mL — ABNORMAL HIGH (ref 0–301)

## 2022-05-11 ENCOUNTER — Telehealth: Payer: Self-pay | Admitting: Physician Assistant

## 2022-05-11 MED ORDER — POTASSIUM CHLORIDE CRYS ER 20 MEQ PO TBCR: 20.0000 meq | EXTENDED_RELEASE_TABLET | Freq: Two times a day (BID) | ORAL | 3 refills | Status: DC

## 2022-05-11 NOTE — Telephone Encounter (Signed)
?  Pt c/o medication issue: ? ?1. Name of Medication: Kdur 71mq  ? ?2. How are you currently taking this medication (dosage and times per day)?  ? ?3. Are you having a reaction (difficulty breathing--STAT)?  ? ?4. What is your medication issue? Pt's son said, he received a call about pt's lab result and Kdur 245m  was added to pt's prescription, however, it is not yet called in to pt's pharmacy. ?

## 2022-05-11 NOTE — Telephone Encounter (Signed)
Tracey Bryant, [place]  ?05/06/2022  8:09 AM EDT   ?  ?Renal function elevated. Significantly elevated BNP suggesting volume overload. Continue lasix '40mg'$  BID. Add Kdur 10mq BID.  ? ?Will place order for Kdur 20 meq BID. Patient's son (DPR) aware. ?

## 2022-05-19 ENCOUNTER — Encounter (HOSPITAL_COMMUNITY): Payer: Self-pay

## 2022-05-19 ENCOUNTER — Observation Stay (HOSPITAL_COMMUNITY): Payer: Medicare HMO

## 2022-05-19 ENCOUNTER — Other Ambulatory Visit: Payer: Self-pay

## 2022-05-19 ENCOUNTER — Telehealth: Payer: Self-pay | Admitting: *Deleted

## 2022-05-19 ENCOUNTER — Emergency Department (HOSPITAL_COMMUNITY): Payer: Medicare HMO

## 2022-05-19 ENCOUNTER — Inpatient Hospital Stay (HOSPITAL_COMMUNITY)
Admission: EM | Admit: 2022-05-19 | Discharge: 2022-06-09 | DRG: 291 | Disposition: A | Discharge status: Skilled Nursing Facility | Payer: Medicare HMO | Attending: Family Medicine | Admitting: Family Medicine

## 2022-05-19 DIAGNOSIS — I495 Sick sinus syndrome: Secondary | ICD-10-CM | POA: Diagnosis present

## 2022-05-19 DIAGNOSIS — E1165 Type 2 diabetes mellitus with hyperglycemia: Secondary | ICD-10-CM | POA: Diagnosis present

## 2022-05-19 DIAGNOSIS — M898X9 Other specified disorders of bone, unspecified site: Secondary | ICD-10-CM | POA: Diagnosis present

## 2022-05-19 DIAGNOSIS — I639 Cerebral infarction, unspecified: Secondary | ICD-10-CM | POA: Diagnosis not present

## 2022-05-19 DIAGNOSIS — J9601 Acute respiratory failure with hypoxia: Secondary | ICD-10-CM | POA: Diagnosis not present

## 2022-05-19 DIAGNOSIS — H8110 Benign paroxysmal vertigo, unspecified ear: Secondary | ICD-10-CM | POA: Diagnosis not present

## 2022-05-19 DIAGNOSIS — I480 Paroxysmal atrial fibrillation: Secondary | ICD-10-CM | POA: Diagnosis present

## 2022-05-19 DIAGNOSIS — I951 Orthostatic hypotension: Secondary | ICD-10-CM | POA: Diagnosis not present

## 2022-05-19 DIAGNOSIS — Z7901 Long term (current) use of anticoagulants: Secondary | ICD-10-CM

## 2022-05-19 DIAGNOSIS — Z950 Presence of cardiac pacemaker: Secondary | ICD-10-CM

## 2022-05-19 DIAGNOSIS — N390 Urinary tract infection, site not specified: Secondary | ICD-10-CM | POA: Diagnosis not present

## 2022-05-19 DIAGNOSIS — B9620 Unspecified Escherichia coli [E. coli] as the cause of diseases classified elsewhere: Secondary | ICD-10-CM | POA: Diagnosis not present

## 2022-05-19 DIAGNOSIS — Z809 Family history of malignant neoplasm, unspecified: Secondary | ICD-10-CM

## 2022-05-19 DIAGNOSIS — Z86718 Personal history of other venous thrombosis and embolism: Secondary | ICD-10-CM

## 2022-05-19 DIAGNOSIS — N183 Chronic kidney disease, stage 3 unspecified: Secondary | ICD-10-CM | POA: Diagnosis not present

## 2022-05-19 DIAGNOSIS — R600 Localized edema: Secondary | ICD-10-CM

## 2022-05-19 DIAGNOSIS — I130 Hypertensive heart and chronic kidney disease with heart failure and stage 1 through stage 4 chronic kidney disease, or unspecified chronic kidney disease: Secondary | ICD-10-CM | POA: Diagnosis not present

## 2022-05-19 DIAGNOSIS — K8010 Calculus of gallbladder with chronic cholecystitis without obstruction: Secondary | ICD-10-CM | POA: Diagnosis present

## 2022-05-19 DIAGNOSIS — R42 Dizziness and giddiness: Secondary | ICD-10-CM

## 2022-05-19 DIAGNOSIS — M1990 Unspecified osteoarthritis, unspecified site: Secondary | ICD-10-CM | POA: Diagnosis present

## 2022-05-19 DIAGNOSIS — Z8673 Personal history of transient ischemic attack (TIA), and cerebral infarction without residual deficits: Secondary | ICD-10-CM

## 2022-05-19 DIAGNOSIS — I5023 Acute on chronic systolic (congestive) heart failure: Secondary | ICD-10-CM | POA: Diagnosis not present

## 2022-05-19 DIAGNOSIS — D631 Anemia in chronic kidney disease: Secondary | ICD-10-CM | POA: Diagnosis present

## 2022-05-19 DIAGNOSIS — E1122 Type 2 diabetes mellitus with diabetic chronic kidney disease: Secondary | ICD-10-CM | POA: Diagnosis present

## 2022-05-19 DIAGNOSIS — R251 Tremor, unspecified: Secondary | ICD-10-CM | POA: Diagnosis not present

## 2022-05-19 DIAGNOSIS — I5043 Acute on chronic combined systolic (congestive) and diastolic (congestive) heart failure: Secondary | ICD-10-CM | POA: Diagnosis present

## 2022-05-19 DIAGNOSIS — Z79899 Other long term (current) drug therapy: Secondary | ICD-10-CM

## 2022-05-19 DIAGNOSIS — K811 Chronic cholecystitis: Secondary | ICD-10-CM | POA: Diagnosis present

## 2022-05-19 DIAGNOSIS — E669 Obesity, unspecified: Secondary | ICD-10-CM | POA: Diagnosis present

## 2022-05-19 DIAGNOSIS — Z8249 Family history of ischemic heart disease and other diseases of the circulatory system: Secondary | ICD-10-CM

## 2022-05-19 DIAGNOSIS — R0602 Shortness of breath: Secondary | ICD-10-CM | POA: Diagnosis not present

## 2022-05-19 DIAGNOSIS — I10 Essential (primary) hypertension: Secondary | ICD-10-CM | POA: Diagnosis present

## 2022-05-19 DIAGNOSIS — Z6828 Body mass index (BMI) 28.0-28.9, adult: Secondary | ICD-10-CM

## 2022-05-19 DIAGNOSIS — I5022 Chronic systolic (congestive) heart failure: Secondary | ICD-10-CM | POA: Insufficient documentation

## 2022-05-19 DIAGNOSIS — E8809 Other disorders of plasma-protein metabolism, not elsewhere classified: Secondary | ICD-10-CM | POA: Diagnosis not present

## 2022-05-19 DIAGNOSIS — N170 Acute kidney failure with tubular necrosis: Secondary | ICD-10-CM | POA: Diagnosis present

## 2022-05-19 DIAGNOSIS — T502X5A Adverse effect of carbonic-anhydrase inhibitors, benzothiadiazides and other diuretics, initial encounter: Secondary | ICD-10-CM | POA: Diagnosis present

## 2022-05-19 DIAGNOSIS — E785 Hyperlipidemia, unspecified: Secondary | ICD-10-CM | POA: Diagnosis present

## 2022-05-19 DIAGNOSIS — E440 Moderate protein-calorie malnutrition: Secondary | ICD-10-CM | POA: Diagnosis present

## 2022-05-19 DIAGNOSIS — Z7984 Long term (current) use of oral hypoglycemic drugs: Secondary | ICD-10-CM

## 2022-05-19 DIAGNOSIS — N184 Chronic kidney disease, stage 4 (severe): Secondary | ICD-10-CM | POA: Diagnosis present

## 2022-05-19 DIAGNOSIS — Z833 Family history of diabetes mellitus: Secondary | ICD-10-CM

## 2022-05-19 DIAGNOSIS — I2510 Atherosclerotic heart disease of native coronary artery without angina pectoris: Secondary | ICD-10-CM | POA: Diagnosis present

## 2022-05-19 DIAGNOSIS — Z531 Procedure and treatment not carried out because of patient's decision for reasons of belief and group pressure: Secondary | ICD-10-CM | POA: Diagnosis present

## 2022-05-19 DIAGNOSIS — N179 Acute kidney failure, unspecified: Secondary | ICD-10-CM | POA: Diagnosis present

## 2022-05-19 DIAGNOSIS — R5381 Other malaise: Secondary | ICD-10-CM

## 2022-05-19 DIAGNOSIS — E46 Unspecified protein-calorie malnutrition: Secondary | ICD-10-CM | POA: Diagnosis not present

## 2022-05-19 DIAGNOSIS — I509 Heart failure, unspecified: Secondary | ICD-10-CM

## 2022-05-19 HISTORY — DX: Chronic systolic (congestive) heart failure: I50.22

## 2022-05-19 LAB — CBC WITH DIFFERENTIAL/PLATELET
Abs Immature Granulocytes: 0.03 10*3/uL (ref 0.00–0.07)
Basophils Absolute: 0.1 10*3/uL (ref 0.0–0.1)
Basophils Relative: 1 %
Eosinophils Absolute: 0.4 10*3/uL (ref 0.0–0.5)
Eosinophils Relative: 5 %
HCT: 39.8 % (ref 36.0–46.0)
Hemoglobin: 12.4 g/dL (ref 12.0–15.0)
Immature Granulocytes: 0 %
Lymphocytes Relative: 21 %
Lymphs Abs: 1.8 10*3/uL (ref 0.7–4.0)
MCH: 28.7 pg (ref 26.0–34.0)
MCHC: 31.2 g/dL (ref 30.0–36.0)
MCV: 92.1 fL (ref 80.0–100.0)
Monocytes Absolute: 0.5 10*3/uL (ref 0.1–1.0)
Monocytes Relative: 6 %
Neutro Abs: 5.7 10*3/uL (ref 1.7–7.7)
Neutrophils Relative %: 67 %
Platelets: 371 10*3/uL (ref 150–400)
RBC: 4.32 MIL/uL (ref 3.87–5.11)
RDW: 15 % (ref 11.5–15.5)
WBC: 8.5 10*3/uL (ref 4.0–10.5)
nRBC: 0 % (ref 0.0–0.2)

## 2022-05-19 LAB — GLUCOSE, CAPILLARY
Glucose-Capillary: 221 mg/dL — ABNORMAL HIGH (ref 70–99)
Glucose-Capillary: 242 mg/dL — ABNORMAL HIGH (ref 70–99)

## 2022-05-19 LAB — BASIC METABOLIC PANEL
Anion gap: 9 (ref 5–15)
BUN: 15 mg/dL (ref 8–23)
CO2: 22 mmol/L (ref 22–32)
Calcium: 8.9 mg/dL (ref 8.9–10.3)
Chloride: 108 mmol/L (ref 98–111)
Creatinine, Ser: 2.13 mg/dL — ABNORMAL HIGH (ref 0.44–1.00)
GFR, Estimated: 24 mL/min — ABNORMAL LOW (ref >60)
Glucose, Bld: 236 mg/dL — ABNORMAL HIGH (ref 70–99)
Potassium: 3.9 mmol/L (ref 3.5–5.1)
Sodium: 139 mmol/L (ref 135–145)

## 2022-05-19 LAB — BRAIN NATRIURETIC PEPTIDE: B Natriuretic Peptide: 2083.9 pg/mL — ABNORMAL HIGH (ref 0.0–100.0)

## 2022-05-19 IMAGING — DX DG CHEST 2V
2 series · 2 of 2 positions shown · non-contrast
Comparison: [DATE]

CLINICAL DATA: Shortness of breath.

EXAM:
CHEST - 2 VIEW

[x chest ap]
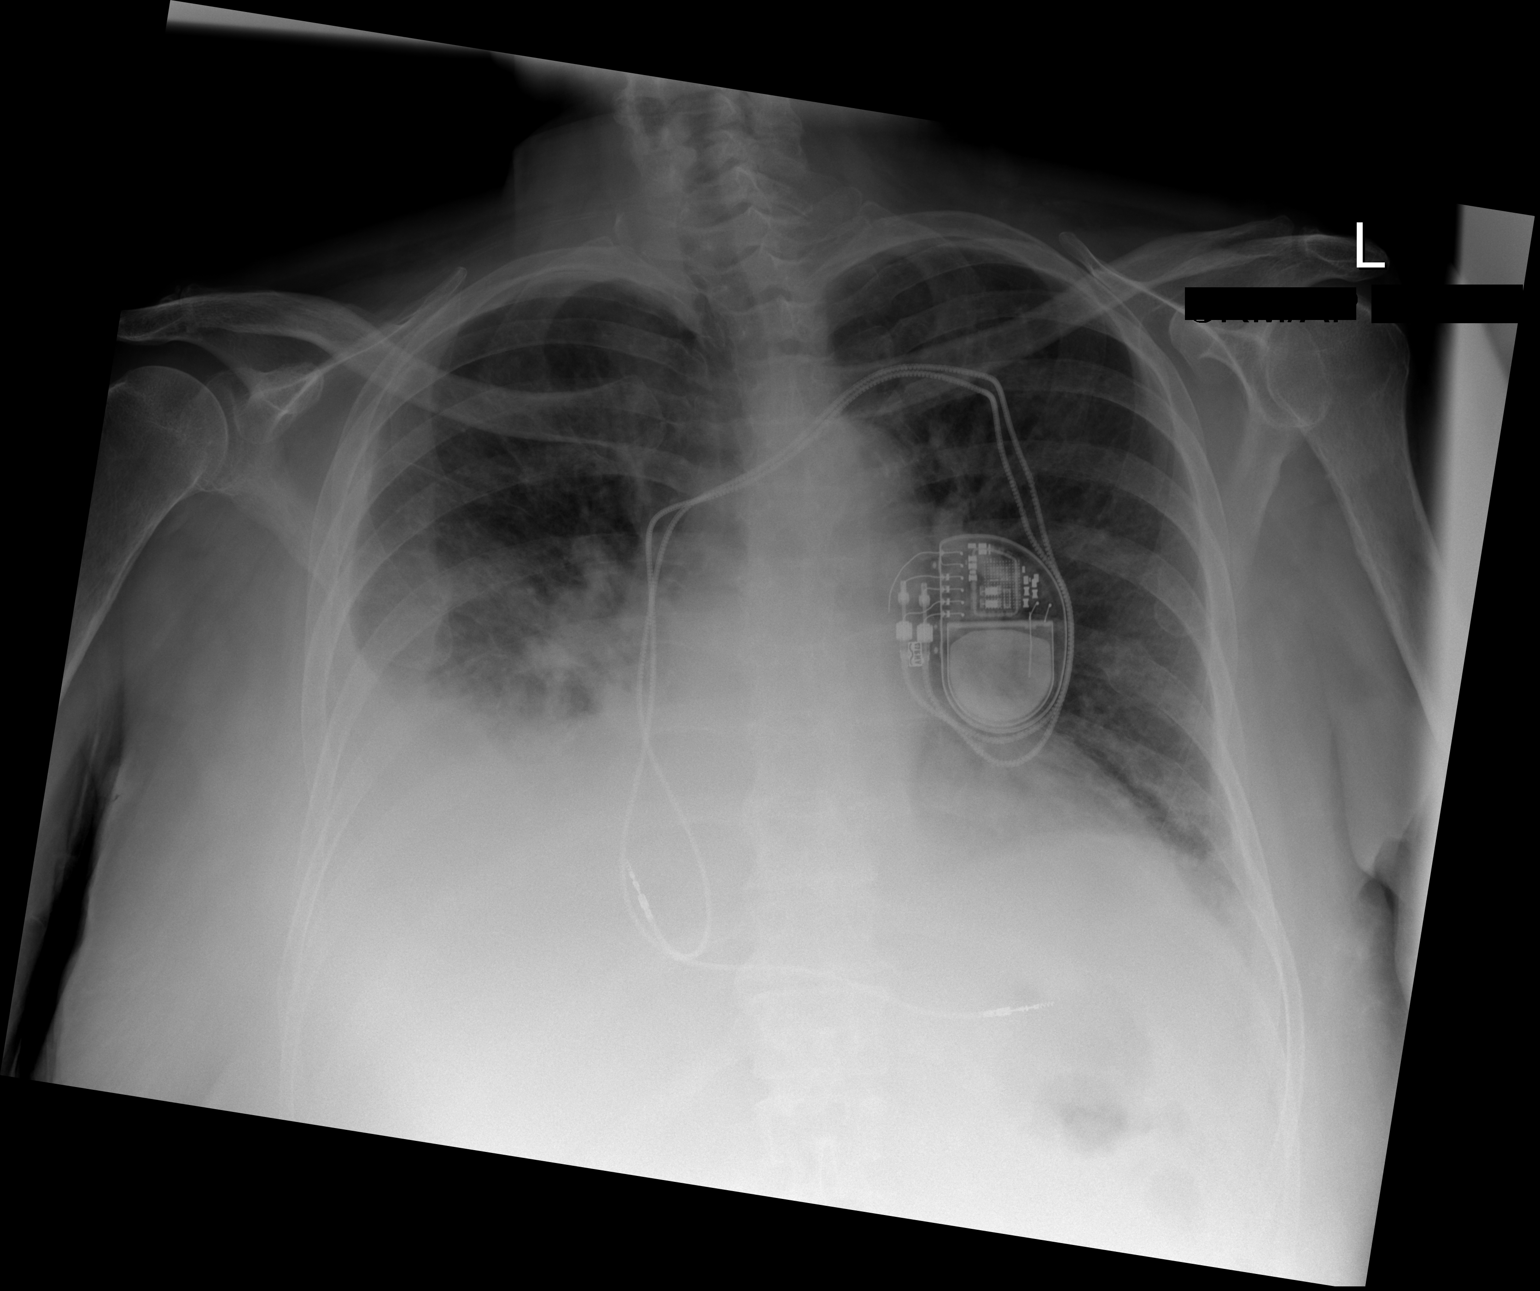

[w chest lat]
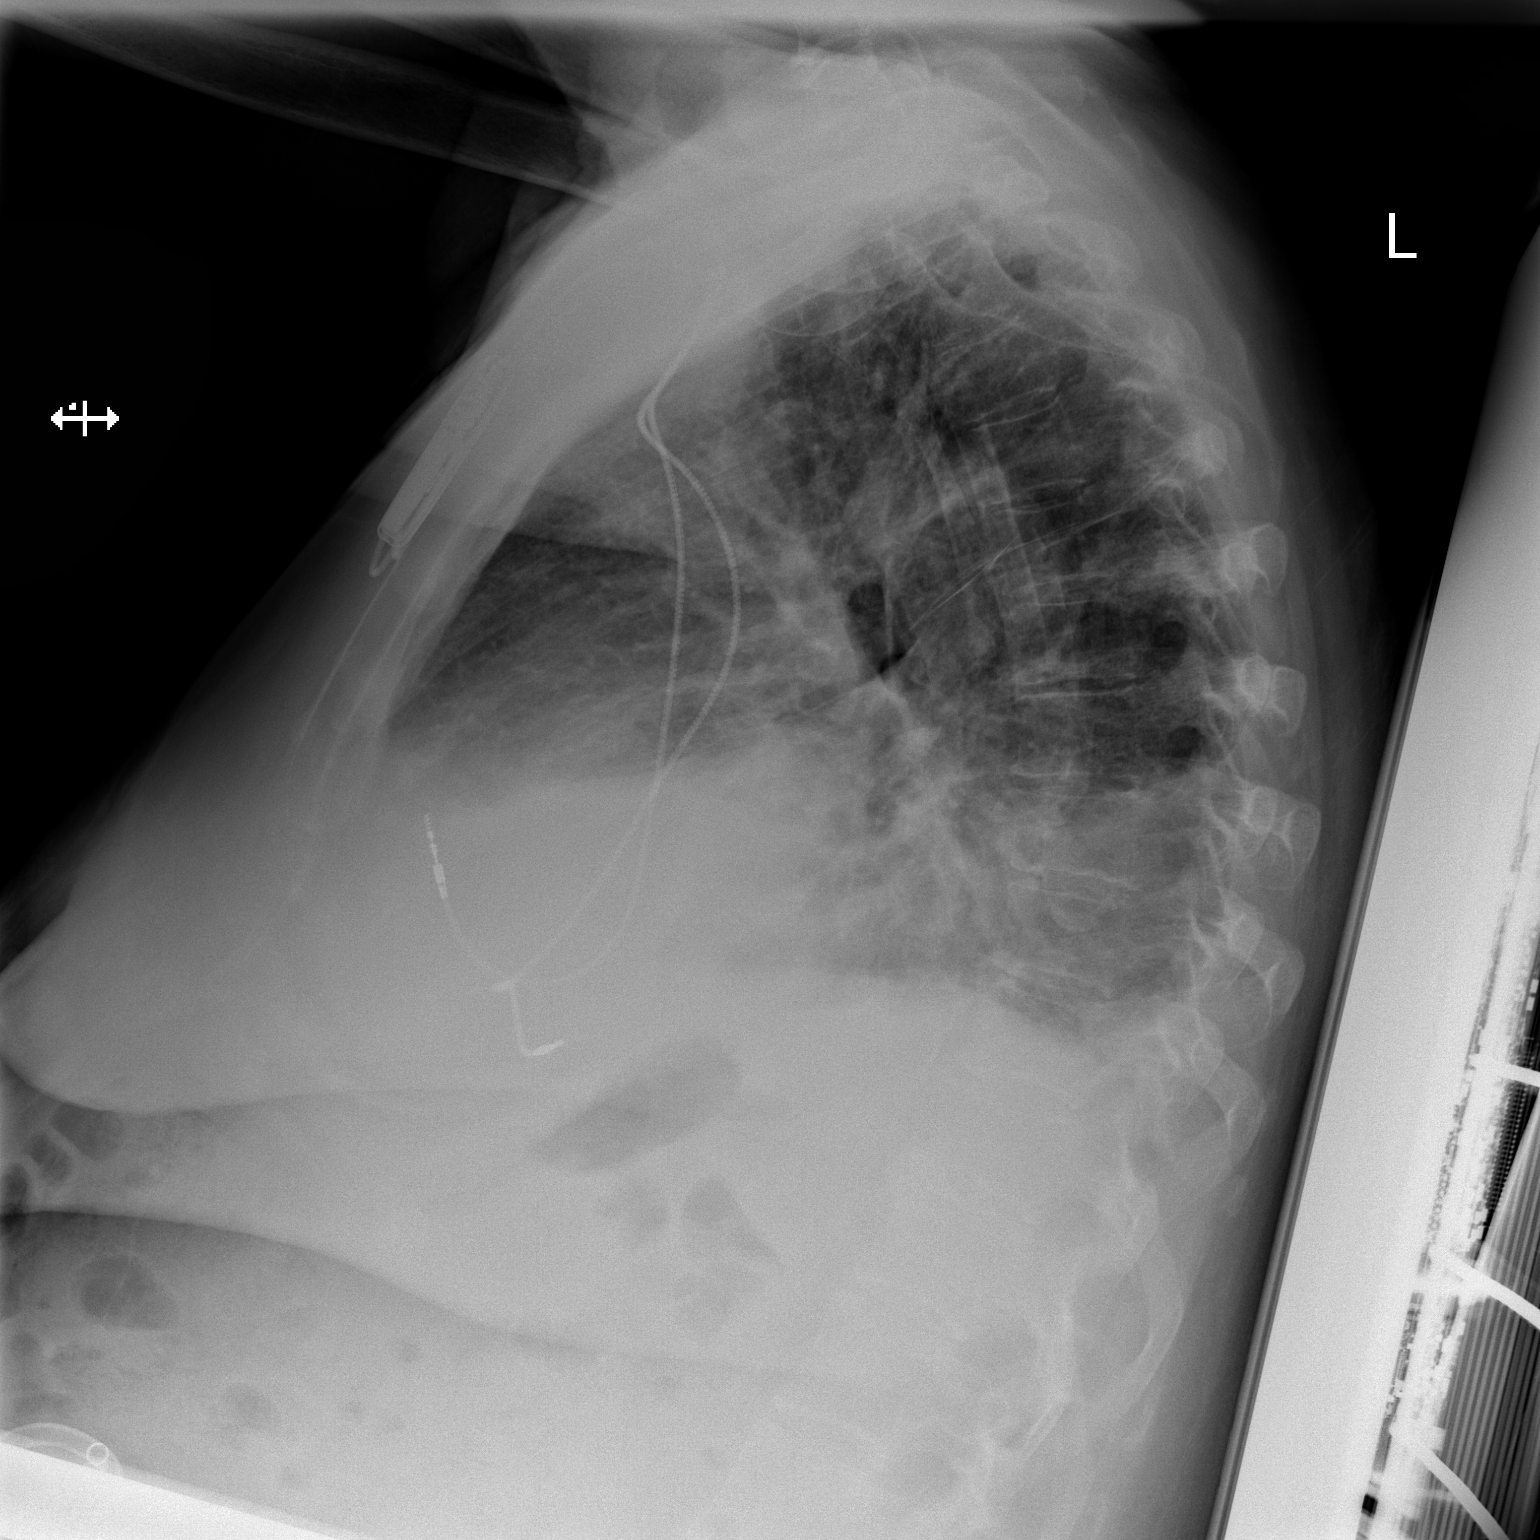

[2 of 2 positions shown; findings below may reference images not displayed]

FINDINGS: Low volume film. Interval progression of right base
collapse/consolidation with moderate right pleural effusion. Minimal
atelectasis or infiltrate noted left base with probable tiny left
pleural effusion. The cardio pericardial silhouette is enlarged.
Dual lead permanent pacemaker evident.
IMPRESSION: Interval progression of right base collapse/consolidation with
moderate right pleural effusion.

## 2022-05-19 IMAGING — DX DG CHEST 1V PORT
1 series · 2 of 2 positions shown · non-contrast
Comparison: [DATE] at [GK] hours

CLINICAL DATA: Shortness of breath, respiratory distress

EXAM:
PORTABLE CHEST 1 VIEW

[Series 1: chest · 0.14mm/px · 2 of 2 slices shown]
[im 1/2]
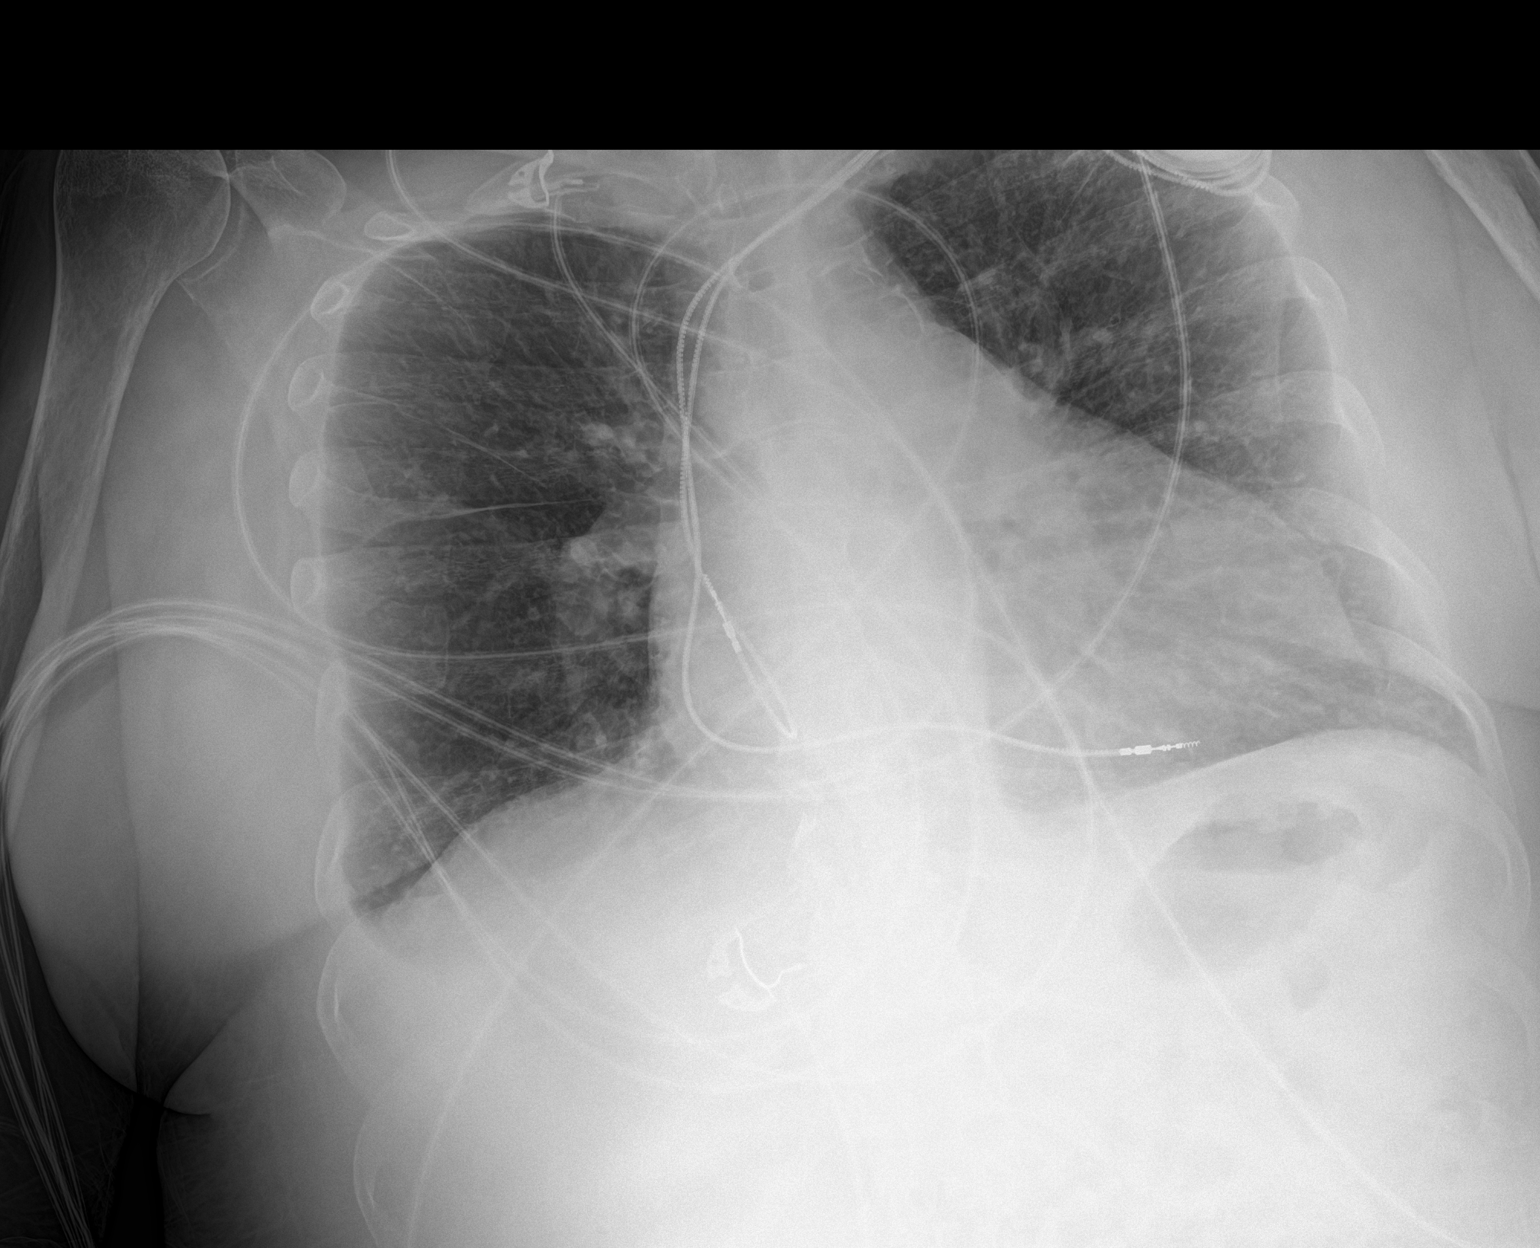
[im 2/2]
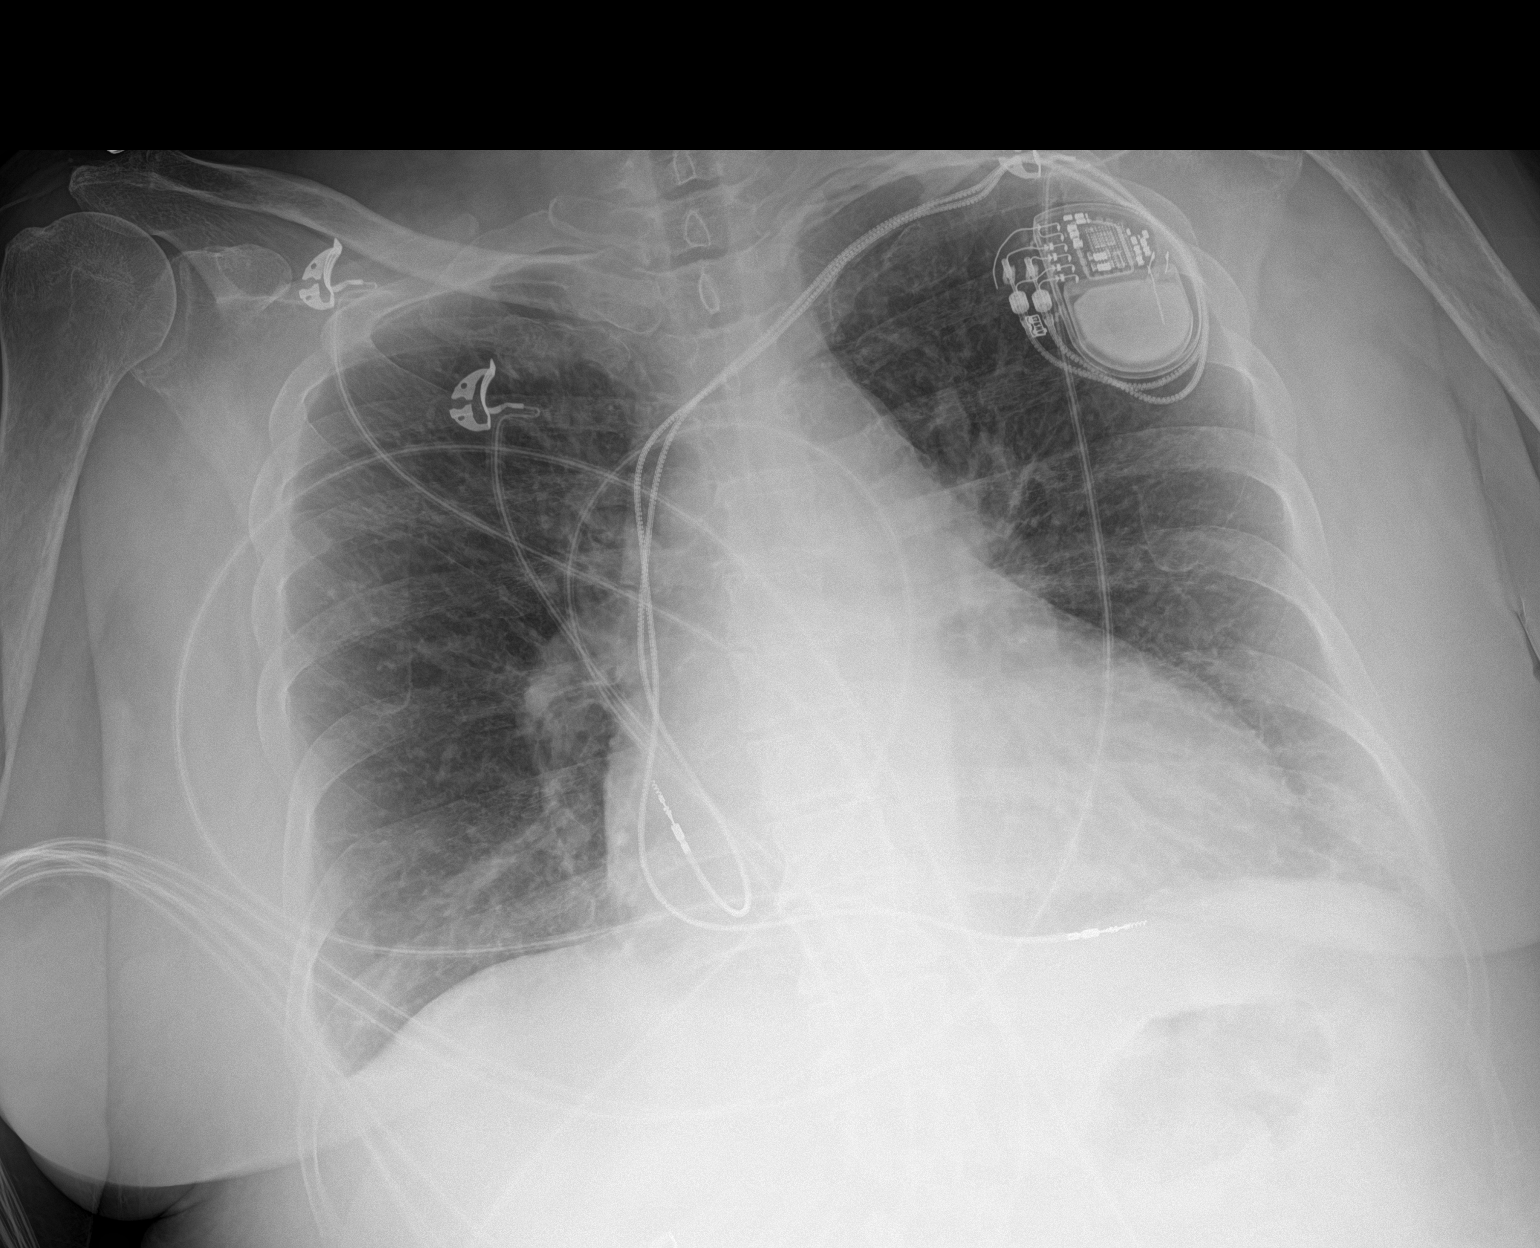

[2 of 2 positions shown; findings below may reference images not displayed]

FINDINGS: Small right pleural effusion, improved. Left lung is clear. No frank
interstitial edema.

Cardiomegaly. Thoracic aortic atherosclerosis. Left subclavian
pacemaker.
IMPRESSION: Small right pleural effusion, improved.

No frank interstitial edema.

## 2022-05-19 MED ORDER — ONDANSETRON HCL 4 MG PO TABS
4.0000 mg | ORAL_TABLET | Freq: Four times a day (QID) | ORAL | Status: DC | PRN
  Administered 2022-05-21 – 2022-06-04 (×4): 4 mg via ORAL
  Filled 2022-05-19 (×4): qty 1

## 2022-05-19 MED ORDER — HYDRALAZINE HCL 25 MG PO TABS
25.0000 mg | ORAL_TABLET | Freq: Two times a day (BID) | ORAL | Status: DC
Start: 2022-05-19 — End: 2022-05-19

## 2022-05-19 MED ORDER — ISOSORBIDE MONONITRATE ER 30 MG PO TB24
15.0000 mg | ORAL_TABLET | Freq: Every day | ORAL | Status: DC
  Administered 2022-05-20 – 2022-06-09 (×21): 15 mg via ORAL
  Filled 2022-05-19 (×21): qty 1

## 2022-05-19 MED ORDER — HYDRALAZINE HCL 20 MG/ML IJ SOLN
5.0000 mg | Freq: Once | INTRAMUSCULAR | Status: AC
  Administered 2022-05-19: 5 mg via INTRAVENOUS
  Filled 2022-05-19: qty 1

## 2022-05-19 MED ORDER — ONDANSETRON HCL 4 MG/2ML IJ SOLN
4.0000 mg | Freq: Four times a day (QID) | INTRAMUSCULAR | Status: DC | PRN
Start: 2022-05-19 — End: 2022-06-09
  Administered 2022-05-21 – 2022-05-30 (×5): 4 mg via INTRAVENOUS
  Filled 2022-05-19 (×5): qty 2

## 2022-05-19 MED ORDER — INSULIN ASPART 100 UNIT/ML IJ SOLN
0.0000 [IU] | Freq: Every day | INTRAMUSCULAR | Status: DC
  Administered 2022-05-19 – 2022-05-22 (×4): 2 [IU] via SUBCUTANEOUS
  Administered 2022-05-23: 4 [IU] via SUBCUTANEOUS
  Administered 2022-05-24: 3 [IU] via SUBCUTANEOUS
  Administered 2022-05-26 – 2022-05-30 (×2): 2 [IU] via SUBCUTANEOUS

## 2022-05-19 MED ORDER — FUROSEMIDE 10 MG/ML IJ SOLN
60.0000 mg | Freq: Two times a day (BID) | INTRAMUSCULAR | Status: DC
  Administered 2022-05-19 – 2022-05-20 (×2): 60 mg via INTRAVENOUS
  Filled 2022-05-19 (×2): qty 6

## 2022-05-19 MED ORDER — OXYCODONE HCL 5 MG PO TABS
5.0000 mg | ORAL_TABLET | ORAL | Status: DC | PRN
  Administered 2022-05-26 – 2022-06-07 (×12): 5 mg via ORAL
  Filled 2022-05-19 (×12): qty 1

## 2022-05-19 MED ORDER — BISACODYL 5 MG PO TBEC
5.0000 mg | DELAYED_RELEASE_TABLET | Freq: Every day | ORAL | Status: DC | PRN
  Administered 2022-05-29: 5 mg via ORAL
  Filled 2022-05-19: qty 1

## 2022-05-19 MED ORDER — HYDRALAZINE HCL 20 MG/ML IJ SOLN
5.0000 mg | INTRAMUSCULAR | Status: DC | PRN
Start: 2022-05-19 — End: 2022-06-09

## 2022-05-19 MED ORDER — SODIUM CHLORIDE 0.9% FLUSH
3.0000 mL | Freq: Two times a day (BID) | INTRAVENOUS | Status: DC
  Administered 2022-05-19 – 2022-06-09 (×40): 3 mL via INTRAVENOUS

## 2022-05-19 MED ORDER — FUROSEMIDE 10 MG/ML IJ SOLN: 40.0000 mg | Freq: Two times a day (BID) | INTRAMUSCULAR | Status: DC

## 2022-05-19 MED ORDER — FUROSEMIDE 10 MG/ML IJ SOLN
40.0000 mg | Freq: Once | INTRAMUSCULAR | Status: AC
  Administered 2022-05-19: 40 mg via INTRAVENOUS
  Filled 2022-05-19: qty 4

## 2022-05-19 MED ORDER — INSULIN ASPART 100 UNIT/ML IJ SOLN
0.0000 [IU] | Freq: Three times a day (TID) | INTRAMUSCULAR | Status: DC
  Administered 2022-05-19 – 2022-05-21 (×6): 5 [IU] via SUBCUTANEOUS
  Administered 2022-05-21: 8 [IU] via SUBCUTANEOUS
  Administered 2022-05-22: 5 [IU] via SUBCUTANEOUS
  Administered 2022-05-22: 3 [IU] via SUBCUTANEOUS
  Administered 2022-05-22: 8 [IU] via SUBCUTANEOUS
  Administered 2022-05-23 (×2): 5 [IU] via SUBCUTANEOUS
  Administered 2022-05-23 – 2022-05-24 (×3): 3 [IU] via SUBCUTANEOUS
  Administered 2022-05-24: 8 [IU] via SUBCUTANEOUS
  Administered 2022-05-25 (×3): 3 [IU] via SUBCUTANEOUS
  Administered 2022-05-26: 5 [IU] via SUBCUTANEOUS
  Administered 2022-05-26: 2 [IU] via SUBCUTANEOUS
  Administered 2022-05-26: 3 [IU] via SUBCUTANEOUS
  Administered 2022-05-27 (×3): 5 [IU] via SUBCUTANEOUS
  Administered 2022-05-28: 3 [IU] via SUBCUTANEOUS
  Administered 2022-05-28: 5 [IU] via SUBCUTANEOUS
  Administered 2022-05-28 – 2022-05-30 (×4): 3 [IU] via SUBCUTANEOUS
  Administered 2022-05-30: 5 [IU] via SUBCUTANEOUS
  Administered 2022-05-31: 3 [IU] via SUBCUTANEOUS
  Administered 2022-06-01 – 2022-06-02 (×3): 2 [IU] via SUBCUTANEOUS
  Administered 2022-06-03: 3 [IU] via SUBCUTANEOUS
  Administered 2022-06-04: 2 [IU] via SUBCUTANEOUS
  Administered 2022-06-04 – 2022-06-05 (×3): 3 [IU] via SUBCUTANEOUS
  Administered 2022-06-06: 2 [IU] via SUBCUTANEOUS
  Administered 2022-06-06: 3 [IU] via SUBCUTANEOUS
  Administered 2022-06-06: 2 [IU] via SUBCUTANEOUS
  Administered 2022-06-07 – 2022-06-09 (×4): 3 [IU] via SUBCUTANEOUS

## 2022-05-19 MED ORDER — CARVEDILOL 6.25 MG PO TABS
6.2500 mg | ORAL_TABLET | Freq: Two times a day (BID) | ORAL | Status: DC
  Administered 2022-05-19 – 2022-06-04 (×32): 6.25 mg via ORAL
  Filled 2022-05-19 (×32): qty 1

## 2022-05-19 MED ORDER — APIXABAN 5 MG PO TABS
5.0000 mg | ORAL_TABLET | Freq: Two times a day (BID) | ORAL | Status: DC
  Administered 2022-05-19 – 2022-06-09 (×42): 5 mg via ORAL
  Filled 2022-05-19 (×42): qty 1

## 2022-05-19 MED ORDER — ACETAMINOPHEN 650 MG RE SUPP: 650.0000 mg | Freq: Four times a day (QID) | RECTAL | Status: DC | PRN

## 2022-05-19 MED ORDER — TRAZODONE HCL 50 MG PO TABS
25.0000 mg | ORAL_TABLET | Freq: Every evening | ORAL | Status: DC | PRN
  Administered 2022-05-19 – 2022-06-08 (×17): 25 mg via ORAL
  Filled 2022-05-19 (×17): qty 1

## 2022-05-19 MED ORDER — DOCUSATE SODIUM 100 MG PO CAPS
100.0000 mg | ORAL_CAPSULE | Freq: Two times a day (BID) | ORAL | Status: DC
  Administered 2022-05-19 – 2022-06-09 (×38): 100 mg via ORAL
  Filled 2022-05-19 (×40): qty 1

## 2022-05-19 MED ORDER — ACETAMINOPHEN 325 MG PO TABS
650.0000 mg | ORAL_TABLET | Freq: Four times a day (QID) | ORAL | Status: DC | PRN
  Administered 2022-05-21 – 2022-06-05 (×7): 650 mg via ORAL
  Filled 2022-05-19 (×8): qty 2

## 2022-05-19 MED ORDER — POLYETHYLENE GLYCOL 3350 17 G PO PACK
17.0000 g | PACK | Freq: Every day | ORAL | Status: DC | PRN
  Administered 2022-06-06: 17 g via ORAL
  Filled 2022-05-19: qty 1

## 2022-05-19 MED ORDER — HYDRALAZINE HCL 25 MG PO TABS
25.0000 mg | ORAL_TABLET | Freq: Three times a day (TID) | ORAL | Status: DC
  Administered 2022-05-19 – 2022-05-21 (×5): 25 mg via ORAL
  Filled 2022-05-19 (×5): qty 1

## 2022-05-19 MED ORDER — MORPHINE SULFATE (PF) 2 MG/ML IV SOLN: 2.0000 mg | INTRAVENOUS | Status: DC | PRN

## 2022-05-19 NOTE — H&P (Signed)
History and Physical    Patient: Tracey Bryant NFA:[id number] DOB: 11-Nov-1951 DOA: 05/19/2022 DOS: the patient was seen and examined on 05/19/2022 PCP: Coolidge Breeze, FNP  Patient coming from: Home - lives alone, daughter lives next door; NOK: Javier Glazier, [phone]   Chief Complaint: SOB  HPI: Tracey Bryant is a 71 y.o. female with medical history significant of PAF; HTN; DM; chronic systolic CHF; and CVA presenting with SOB. She reports that 2-3 weeks ago she was overloaded.  Her doctor increased her medications but it doesn't seem to be getting the fluid off.  She has SOB.  She is too SOB to even walk.  Her weight is up - was 150 -> 204.  No one has weighed her today.  +LE edema.  +cough, minimally productive.  +orthopnea.  She stays very tired.      ER Course:  Increasing SOB and edema x 2 weeks.  Unable to tolerate ambulation today.  Placed on 2L O2 for comfort.  Likely CHF exacerbation.  Given Lasix.  HTN high while here despite AM meds.  Given hydralazine.       Review of Systems: As mentioned in the history of present illness. All other systems reviewed and are negative. Past Medical History:  Diagnosis Date   Arthritis    Chronic systolic (congestive) heart failure (HCC)    Diabetes mellitus without complication ([place])    DVT (deep venous thrombosis) ([place]) 07/26/2021   Hypertension    Paroxysmal atrial fibrillation ([place]) 07/26/2021   Stroke [facility])    Past Surgical History:  Procedure Laterality Date   breast tumor     IR CATHETER TUBE CHANGE  09/25/2021   IR CHOLANGIOGRAM EXISTING TUBE  05/13/2021   IR EXCHANGE BILIARY DRAIN  07/27/2021   IR EXCHANGE BILIARY DRAIN  11/04/2021   IR EXCHANGE BILIARY DRAIN  01/08/2022   IR RADIOLOGIST EVAL & MGMT  09/04/2021   IR REMOVAL OF CALCULI/DEBRIS BILIARY DUCT/GB  09/25/2021   IR REMOVAL OF CALCULI/DEBRIS BILIARY DUCT/GB  11/04/2021   IR REMOVAL OF CALCULI/DEBRIS BILIARY DUCT/GB  01/08/2022   PACEMAKER IMPLANT      TONSILLECTOMY     Social History:  reports that she has never smoked. She has never used smokeless tobacco. She reports that she does not drink alcohol and does not use drugs.  No Known Allergies  Family History  Problem Relation Age of Onset   Cancer Mother    Diabetes Mother    Heart disease Father     Prior to Admission medications   Medication Sig Start Date End Date Taking? Authorizing Provider  apixaban (ELIQUIS) 5 MG TABS tablet Take 1 tablet (5 mg total) by mouth 2 (two) times daily. 08/04/21   Pattricia Boss, MD  carvedilol (COREG) 6.25 MG tablet Take 1 tablet (6.25 mg total) by mouth 2 (two) times daily with a meal. 08/01/21   [name], MD  furosemide (LASIX) 40 MG tablet Take 1 tablet (40 mg total) by mouth 2 (two) times daily. 05/04/22   Bhagat, Bhavinkumar, PA  glipiZIDE (GLUCOTROL XL) 5 MG 24 hr tablet Take 5 mg by mouth daily. 11/03/21   [provider]  hydrALAZINE (APRESOLINE) 25 MG tablet Take 1 tablet (25 mg total) by mouth in the morning and at bedtime. 05/04/22   Bhagat, Crista Luria, PA  isosorbide mononitrate (IMDUR) 30 MG 24 hr tablet Take 0.5 tablets (15 mg total) by mouth daily. 05/04/22   Bhagat, Crista Luria, PA  potassium chloride  SA (KLOR-CON M) 20 MEQ tablet Take 1 tablet (20 mEq total) by mouth 2 (two) times daily. 05/11/22   Leanor Kail, PA    Physical Exam: Vitals:   05/19/22 1400 05/19/22 1430 05/19/22 1500 05/19/22 1605  BP: (!) 178/80 (!) 148/76 (!) 146/75 (!) 166/78  Pulse: 71 78 79 69  Resp: '18 16 17 20  '$ Temp:    97.6 F (36.4 C)  TempSrc:    Oral  SpO2: 98% 97% 97% 100%  Weight:      Height:       General:  Appears calm and comfortable and is in NAD Eyes:   EOMI, mild erythema of lids, normal iris ENT:  grossly normal hearing, lips & tongue, mmm Neck:  no LAD, masses or thyromegaly Cardiovascular:  RRR, no m/r/g. No LE edema.  Respiratory:   CTA bilaterally with no wheezes/rales/rhonchi.  Normal respiratory  effort. Abdomen:  soft, NT, ND Skin:  no rash or induration seen on limited exam Musculoskeletal:  grossly normal tone BUE/BLE, good ROM, no bony abnormality Psychiatric:  grossly normal mood and affect, speech fluent and appropriate, AOx3 Neurologic:  CN 2-12 grossly intact, moves all extremities in coordinated fashion   Radiological Exams on Admission: Independently reviewed - see discussion in A/P where applicable  DG Chest 2 View  Result Date: 05/19/2022 CLINICAL DATA:  Shortness of breath. EXAM: CHEST - 2 VIEW COMPARISON:  12/25/2021 FINDINGS: Low volume film. Interval progression of right base collapse/consolidation with moderate right pleural effusion. Minimal atelectasis or infiltrate noted left base with probable tiny left pleural effusion. The cardio pericardial silhouette is enlarged. Dual lead permanent pacemaker evident. IMPRESSION: Interval progression of right base collapse/consolidation with moderate right pleural effusion. Electronically Signed   By: Misty Stanley M.D.   On: 05/19/2022 11:07   DG CHEST PORT 1 VIEW  Result Date: 05/19/2022 CLINICAL DATA:  Shortness of breath, respiratory distress EXAM: PORTABLE CHEST 1 VIEW COMPARISON:  05/19/2022 at 1123 hours FINDINGS: Small right pleural effusion, improved. Left lung is clear. No frank interstitial edema. Cardiomegaly. Thoracic aortic atherosclerosis. Left subclavian pacemaker. IMPRESSION: Small right pleural effusion, improved. No frank interstitial edema. Electronically Signed   By: Julian Hy M.D.   On: 05/19/2022 17:32    EKG: Independently reviewed.  Atrial paced with rate 71; nonspecific ST changes with no evidence of acute ischemia   Labs on Admission: I have personally reviewed the available labs and imaging studies at the time of the admission.  Pertinent labs:   Glucose 236 BUN 15/Creatinine 2.13/GFR 24 - stable  BNP 2083.9; 3291 on 12/30 Normal CBC   Assessment and Plan: Principal Problem:    Acute on chronic systolic CHF (congestive heart failure) (HCC) Active Problems:   Hypertension   Uncontrolled type 2 diabetes mellitus with hyperglycemia, with long-term current use of insulin (HCC)   Paroxysmal atrial fibrillation (HCC)   CKD (chronic kidney disease), stage IV (HCC)   Pleural effusion due to CHF (congestive heart failure) (HCC)    Acute on chronic systolic CHF -Patient with known h/o chronic systolic CHF presenting with worsening SOB and edema -CXR without pulmonary edema but with pleural effusion -Elevated BNP that is better than in Dec but much worse than last August -With elevated BNP and current symptoms, acute decompensated CHF seems probable as diagnosis -Will place in observation status with telemetry, as there are no current findings necessitating admission (hemodynamic instability, severe electrolyte abnormalities, cardiac arrhythmias, ACS, severe pulmonary edema requiring new O2 therapy, AMS) -Will  request echocardiogram, last done in December (EF 40-45%) -Continue Coreg, no ACE due to renal dysfunction -CHF order set utilized -Cardiology consult -Was given Lasix 40 mg x 1 in ER and will repeat with 40 mg IV BID -Continue [place] O2 prn for now for comfort -Stable kidney function at this time, will follow  R pleural effusion -Patient with prior pleural effusion requiring thoracentesis  -Now presenting with moderate right-sided effusion  -She is having SOB which is likely related to the effusion -Will consult pulm for thoracentesis -This is likely associated with CHF and so fluid does not appear to need to be sent for studies at this time  Afib -Rate controlled with Coreg -Has pacemaker -Continue Eliquis  HTN -Continue Coreg, hydralazine -Will also add prn hydralazine  DM -Last A1c was 7.7, indicating suboptimal control -Hold glipizide -Will cover with moderate-scale SSI for now  H/o CVA, CAD -Not on ASA -Continue Imdur  Stage 4 CKD -Appears to  be stable at this time -Will follow with diuresis -She would benefit from outpatient nephrology consultation    Advance Care Planning:   Code Status: Full Code   Consults: Cardiology; pulmonology; CHF navigator; [facility] team; nutrition; PT/OT  DVT Prophylaxis: Eliquis  Family Communication: None present; I attempted to call her son after admission and was unable to reach him by telephone  Severity of Illness: The appropriate patient status for this patient is OBSERVATION. Observation status is judged to be reasonable and necessary in order to provide the required intensity of service to ensure the patient's safety. The patient's presenting symptoms, physical exam findings, and initial radiographic and laboratory data in the context of their medical condition is felt to place them at decreased risk for further clinical deterioration. Furthermore, it is anticipated that the patient will be medically stable for discharge from the hospital within 2 midnights of admission.   Author: Karmen Bongo, MD 05/19/2022 6:18 PM  For on call review [email or web address].

## 2022-05-19 NOTE — ED Triage Notes (Signed)
Pt arrived POV from home c/o [facility]. Pt states she always has SHOB d/t CHF but this morning it was worse. Pt also endorses bilateral leg swelling.

## 2022-05-19 NOTE — ED Notes (Signed)
O2 at 97% on RA. Pt came back from xray on wheelchair. Walked a few steps to the bed and became very tachypneic at 35cpm. O2 at 2LPM initiated for comfort at this time. Cardiac monitoring initiated.

## 2022-05-19 NOTE — ED Notes (Signed)
Pt bp maintains in the 897V systolic. Denies headache, dizziness or any other discomfort. Reports taking her scheduled meds this AM. Provider notified.

## 2022-05-19 NOTE — Procedures (Signed)
Thoracentesis  Procedure Note  Tracey Bryant  [id number]  30-Aug-1951  Date:05/19/22  Time:5:17 PM   Provider Performing:Donathan Buller C Tamala Julian   Procedure: Thoracentesis with imaging guidance (26333)  Indication(s) Pleural Effusion  Consent Risks of the procedure as well as the alternatives and risks of each were explained to the patient and/or caregiver.  Consent for the procedure was obtained and is signed in the bedside chart  Anesthesia Topical only with 1% lidocaine    Time Out Verified patient identification, verified procedure, site/side was marked, verified correct patient position, special equipment/implants available, medications/allergies/relevant history reviewed, required imaging and test results available.   Sterile Technique Maximal sterile technique including full sterile barrier drape, hand hygiene, sterile gown, sterile gloves, mask, hair covering, sterile ultrasound probe cover (if used).  Procedure Description Ultrasound was used to identify appropriate pleural anatomy for placement and overlying skin marked.  Area of drainage cleaned and draped in sterile fashion. Lidocaine was used to anesthetize the skin and subcutaneous tissue.  1300 cc's of straw appearing fluid was drained from the right pleural space. Catheter then removed and bandaid applied to site.   Complications/Tolerance None; patient tolerated the procedure well. Chest X-ray is ordered to confirm no post-procedural complication.   EBL Minimal   Specimen(s) None

## 2022-05-19 NOTE — Consult Note (Addendum)
Cardiology Consultation:   Patient ID: Tracey Bryant MRN: [id number]; DOB: Jun 01, 1951  Admit date: 05/19/2022 Date of Consult: 05/19/2022  PCP:  [facility] B[facility]ze, [facility] Providers Cardiologist:  Minus Breeding, MD  Electrophysiologist:  Cristopher Peru, MD      Patient Profile:   Tracey Bryant is a 71 y.o. female with a hx of PAF, HTN, type 2 DM, chronic systolic CHF, history of CVA who is being seen 05/19/2022 for the evaluation of CHF at the request of Dr. Lorin Mercy.  History of Present Illness:   Tracey Bryant is a 71 year old female with above medical history who is followed by Dr. Percival Spanish, Dr. Lovena Le.  Per chart review, patient established care with our cardiology group in August 2022.  [place] and had a PCP but was not following with a cardiologist.   When patient first established care with our group, she had a history of paroxysmal atrial fibrillation and was on anticoagulation with Eliquis.  Patient had a Medtronic pacemaker implanted on 05/19/2020 for sinus node dysfunction at the [facility]. Patient also had a known history of chronic systolic and diastolic heart failure.   Patient was admitted to [facility] from 03/16/2021 to 04/08/2021 for acute cholecystitis.  Hospital course was complicated by sepsis, UTI, multiple subacute CVAs, multiple syncopal spells, left popliteal DVT, acute CHF exacerbation, AKI, and anemia.  Patient was seen by general surgery and deemed not a candidate for surgery and instead had cholecystectomy tubes placed by IR.  Patient was evaluated by neurology for multiple syncopal episodes and brain imaging revealed subacute right PCA and left cerebellum strokes felt to be cardioembolic with underlying paroxysmal A-fib and reduced EF cardiomyopathy.  Echocardiogram on 03/18/2021 showed EF 25-30%, LV global hypokinesis, mild LVH, grade 2 diastolic dysfunction, elevated LA pressure, LA severely dilated, no  significant valvular disease.  Patient reported that she had been diagnosed with CHF for several years, was uncertain of the etiology, and denied ever having a history of MI/PCI/CABG.  More recently, patient presented to the ER on 07/26/2021 with shortness of breath.  Patient was seen by cardiology that admission, was diuresed and later discharged.  Follow up echocardiogram on 12/26/22 showed EF 40-45%, moderately elevated pulmonary artyer systolic pressure, moderate pleural effusion.  Patient was last seen by cardiology on 05/04/2022.  At that appointment, patient reported a few weeks history of progressively worsening lower extremity edema, shortness of breath, orthopnea, PND.  Patient was instructed to increase her Lasix to 40 mg twice a day, continue low-sodium diet.  Imdur and hydralazine were added for afterload reduction.  Guideline directed medical therapy was limited by chronic kidney disease.  Patient presented to [facility] office earlier today thinking that they had an appointment.  Patient complained of continued swelling in legs and shortness of breath, patient was advised she should go to the ED at [facility].  Patient presented to the ED complaining of shortness of breath and bilateral leg swelling.  Oxygenating well on room air with O2 sats 97% on room air.  However after walking a few steps the patient became tachypneic and was started on supplemental oxygen via nasal cannula. Labs in the ED showed Na 139, K 3.9, creatinine 2.13, WBC 8.5, hemoglobin 12.4, platelets 371. BNP elevated to 2083.9.  CXR showed interval progression of right base collapse/consolidation with moderate right pleural effusion.  EKG showed an atrial paced rhythm, old septal infarct.  On interview, patient reports that she had been doing OK up until 2 weeks ago. She then started to notice progressive sob, ankle swelling, and weight gain. SOB was initially just on exertion, but it progressed  to where she could not stand up out of be or talk without sob. Also complained of orthopnea, PND. Denies any chest pain. Denies palpitations. Dry weight is normally 150 lbs. Patient reports a long history of CHF, denies any history of heart attack. Does not think she has ever had a cardiac catheterization   Past Medical History:  Diagnosis Date   Arthritis    Chronic systolic (congestive) heart failure (HCC)    Diabetes mellitus without complication (HCC)    DVT (deep venous thrombosis) ([place]) 07/26/2021   Hypertension    Paroxysmal atrial fibrillation ([place]) 07/26/2021   [facility]roke [facility]     Past Surgical History:  Procedure Laterality Date   breast tumor     IR CATHETER TUBE CHANGE  09/25/2021   IR CHOLANGIOGRAM EXISTING TUBE  05/13/2021   IR EXCHANGE BILIARY DRAIN  07/27/2021   IR EXCHANGE BILIARY DRAIN  11/04/2021   IR EXCHANGE BILIARY DRAIN  01/08/2022   IR RADIOLOGIST EVAL & MGMT  09/04/2021   IR REMOVAL OF CALCULI/DEBRIS BILIARY DUCT/GB  09/25/2021   IR REMOVAL OF CALCULI/DEBRIS BILIARY DUCT/GB  11/04/2021   IR REMOVAL OF CALCULI/DEBRIS BILIARY DUCT/GB  01/08/2022   PACEMAKER IMPLANT     TONSILLECTOMY       Home Medications:  Prior to Admission medications   Medication Sig Start Date End Date Taking? Authorizing Provider  apixaban (ELIQUIS) 5 MG TABS tablet Take 1 tablet (5 mg total) by mouth 2 (two) times daily. 08/04/21   Pattricia Boss, MD  carvedilol (COREG) 6.25 MG tablet Take 1 tablet (6.25 mg total) by mouth 2 (two) times daily with a meal. 08/01/21   [name], MD  furosemide (LASIX) 40 MG tablet Take 1 tablet (40 mg total) by mouth 2 (two) times daily. 05/04/22   Bhagat, Bhavinkumar, PA  glipiZIDE (GLUCOTROL XL) 5 MG 24 hr tablet Take 5 mg by mouth daily. 11/03/21   [provider]  hydrALAZINE (APRESOLINE) 25 MG tablet Take 1 tablet (25 mg total) by mouth in the morning and at bedtime. 05/04/22   Bhagat, Crista Luria, PA  isosorbide mononitrate (IMDUR) 30 MG 24 hr tablet  Take 0.5 tablets (15 mg total) by mouth daily. 05/04/22   Bhagat, Crista Luria, PA  potassium chloride SA (KLOR-CON M) 20 MEQ tablet Take 1 tablet (20 mEq total) by mouth 2 (two) times daily. 05/11/22   Leanor Kail, PA    Inpatient Medications: Scheduled Meds:  apixaban  5 mg Oral BID   carvedilol  6.25 mg Oral BID WC   docusate sodium  100 mg Oral BID   furosemide  40 mg Intravenous BID   hydrALAZINE  25 mg Oral BID   [START ON 05/20/2022] insulin aspart  0-15 Units Subcutaneous TID WC   insulin aspart  0-5 Units Subcutaneous QHS   [START ON 05/20/2022] isosorbide mononitrate  15 mg Oral Daily   sodium chloride flush  3 mL Intravenous Q12H   Continuous Infusions:  PRN Meds: acetaminophen **OR** acetaminophen, bisacodyl, hydrALAZINE, morphine injection, ondansetron **OR** ondansetron (ZOFRAN) IV, oxyCODONE, polyethylene glycol, traZODone  Allergies:   No Known Allergies  Social History:   Social History   Socioeconomic History   Marital status: Divorced    Spouse name: Not on file   Number of children: Not  on file   Years of education: Not on file   Highest education level: Not on file  Occupational History   Occupation: retired  Tobacco Use   Smoking status: Never   Smokeless tobacco: Never  Vaping Use   Vaping Use: Never used  Substance and Sexual Activity   Alcohol use: No   Drug use: Never   Sexual activity: Not on file  Other Topics Concern   Not on file  Social History Narrative   Not on file   Social Determinants of Health   Financial Resource Strain: Not on file  Food Insecurity: Not on file  Transportation Needs: Not on file  Physical Activity: Not on file  Stress: Not on file  Social Connections: Not on file  Intimate Partner Violence: Not on file    Family History:    Family History  Problem Relation Age of Onset   Cancer Mother    Diabetes Mother    Heart disease Father      ROS:  Please see the history of present illness.   All  other ROS reviewed and negative.     Physical Exam/Data:   Vitals:   05/19/22 1400 05/19/22 1430 05/19/22 1500 05/19/22 1605  BP: (!) 178/80 (!) 148/76 (!) 146/75 (!) 166/78  Pulse: 71 78 79 69  Resp: '18 16 17 20  '$ Temp:    97.6 F (36.4 C)  TempSrc:    Oral  SpO2: 98% 97% 97% 100%  Weight:      Height:        Intake/Output Summary (Last 24 hours) at 05/19/2022 1647 Last data filed at 05/19/2022 1437 Gross per 24 hour  Intake --  Output 700 ml  Net -700 ml      05/19/2022   10:21 AM 01/08/2022    7:20 AM 01/07/2022    8:15 AM  Last 3 Weights  Weight (lbs) 150 lb 150 lb 150 lb 12.8 oz  Weight (kg) 68.04 kg 68.04 kg 68.402 kg     Body mass index is 24.21 kg/m.  General:  Elderly female, becomes SOB while speaking. No acute distress, wearing nasal cannula with 2 L supplemental oxygen  HEENT: normal Neck: supple  Vascular: Radial pulses 2+ bilaterally  Cardiac:  normal S1, S2; RRR; no murmur  Lungs:  crackles in right and left lung bases, diminished breath sounds on right  Abd: soft, nontender, no hepatomegaly  Ext: 3+ edema to the knees in BLE  Musculoskeletal:  No deformities, BUE and BLE strength normal and equal Skin: warm and dry  Neuro:  CNs 2-12 intact, no focal abnormalities noted Psych:  Normal affect   EKG:  The EKG was personally reviewed and demonstrates:  atrial paced rhythm, old septal infarct.  Telemetry:  Telemetry was personally reviewed and demonstrates:  Sinus rhythm, atrial paced rhythm   Relevant CV Studies:   Laboratory Data:  High Sensitivity Troponin:  No results for input(s): TROPONINIHS in the last 720 hours.   Chemistry Recent Labs  Lab 05/19/22 1040  NA 139  K 3.9  CL 108  CO2 22  GLUCOSE 236*  BUN 15  CREATININE 2.13*  CALCIUM 8.9  GFRNONAA 24*  ANIONGAP 9    No results for input(s): PROT, ALBUMIN, AST, ALT, ALKPHOS, BILITOT in the last 168 hours. Lipids No results for input(s): CHOL, TRIG, HDL, LABVLDL, LDLCALC, CHOLHDL in  the last 168 hours.  Hematology Recent Labs  Lab 05/19/22 1040  WBC 8.5  RBC 4.32  HGB  12.4  HCT 39.8  MCV 92.1  MCH 28.7  MCHC 31.2  RDW 15.0  PLT 371   Thyroid No results for input(s): TSH, FREET4 in the last 168 hours.  BNP Recent Labs  Lab 05/19/22 1040  BNP 2,083.9*    DDimer No results for input(s): DDIMER in the last 168 hours.   Radiology/Studies:  DG Chest 2 View  Result Date: 05/19/2022 CLINICAL DATA:  Shortness of breath. EXAM: CHEST - 2 VIEW COMPARISON:  12/25/2021 FINDINGS: Low volume film. Interval progression of right base collapse/consolidation with moderate right pleural effusion. Minimal atelectasis or infiltrate noted left base with probable tiny left pleural effusion. The cardio pericardial silhouette is enlarged. Dual lead permanent pacemaker evident. IMPRESSION: Interval progression of right base collapse/consolidation with moderate right pleural effusion. Electronically Signed   By: Misty Stanley M.D.   On: 05/19/2022 11:07     Assessment and Plan:   Acute on Chronic HFrEF  - Most recent echocardiogram on 12/32/23 showed EF 40-45%, moderately elevated pulmonary artery systolic pressure  - Patient presented complaining of increasing SOB and edema for 2 weeks, believes her weight is up from 150>>200 lbs (has not been weighed yet this admission)  - CXR showed moderate right pleural effusion, interval progression of right base collapse/consolidation  - Echo this admission pending  - Underwent thoracentesis today that yielded 1300cc's of fluid  - Start IV lasix 60 mg BID for now (creatinine 2.13, approximately baseline)  - Strict I/Os, daily weights  - GDMT limited by CKD (no ACE/ARB/ARNI or spiro)  - Continue carvedilol 6.25 mg BID, imdur 15 mg daily - Increase hydralazine to 25 mg TID  - Wean oxygen as tolerated  HTN  - Continue home carvedilol 6.25 mg BID, imdur 15 mg daily  - Increase hydralazine to 25 mg TID   Paroxysmal Atrial  Fibrillation S/p Medtronic Dual Chamber PPM (04/2020)  - Maintaining sinus rhythm/atrial paced rhythm per telemetry  - Continue eliquis and carvedilol  CKD stage IIIb  - Creatinine 2.13 today, appears to be close to baseline  - Avoid nephrotoxic medications    Risk Assessment/Risk Scores:   New York Heart Association (NYHA) Functional Class NYHA Class IV        For questions or updates, please contact [facility] Please consult [email or web address] for contact info under    Signed, Margie Billet, PA-C  05/19/2022 4:47 PM  I have examined the patient and reviewed assessment and plan and discussed with patient.  Agree with above as stated.    S/p thoracentesis..  Breathing much better.  Will also diurese.  AFib controlled. Eliquis for stroke prevention. Need to watch renal function closely with aggressive diuresis.  Will follow.   Larae Grooms

## 2022-05-19 NOTE — Telephone Encounter (Signed)
Patient came into office with son today. They thought patient had appointment today.  Patient complains of continued swelling in legs. Also some shortness of breath.  Patient advised she should go to ED at [facility].  Son feels he can take her and will transport her directly to ED.

## 2022-05-19 NOTE — ED Provider Notes (Signed)
[facility] EMERGENCY DEPARTMENT Provider Note   CSN: [id number] Arrival date & time: 05/19/22  1011     History  Chief Complaint  Patient presents with   Shortness of Breath    Tracey Bryant is a 71 y.o. female.  Patient reports increasing shortness of breath and decreased activity tolerance.  Patient reports she has a history of congestive heart failure.  Pt reports no relief from lasix at home.  Pt reports lasix was increased recently.  Pt's son is with pt.  Pt went to heart failur clinic today and was advised to come here.  Pt complains of swelling in her abdomen and her legs.    The history is provided by the patient.  Shortness of Breath Severity:  Severe Onset quality:  Gradual Duration:  2 weeks Timing:  Constant Progression:  Worsening Chronicity:  New Context: activity   Relieved by:  Nothing Worsened by:  Activity Ineffective treatments:  None tried Associated symptoms: vomiting   Risk factors: no tobacco use       Home Medications Prior to Admission medications   Medication Sig Start Date End Date Taking? Authorizing Provider  apixaban (ELIQUIS) 5 MG TABS tablet Take 1 tablet (5 mg total) by mouth 2 (two) times daily. 08/04/21   Pattricia Boss, MD  carvedilol (COREG) 6.25 MG tablet Take 1 tablet (6.25 mg total) by mouth 2 (two) times daily with a meal. 08/01/21   [name], MD  furosemide (LASIX) 40 MG tablet Take 1 tablet (40 mg total) by mouth 2 (two) times daily. 05/04/22   Bhagat, Bhavinkumar, PA  glipiZIDE (GLUCOTROL XL) 5 MG 24 hr tablet Take 5 mg by mouth daily. 11/03/21   [provider]  hydrALAZINE (APRESOLINE) 25 MG tablet Take 1 tablet (25 mg total) by mouth in the morning and at bedtime. 05/04/22   Bhagat, Crista Luria, PA  isosorbide mononitrate (IMDUR) 30 MG 24 hr tablet Take 0.5 tablets (15 mg total) by mouth daily. 05/04/22   Bhagat, Crista Luria, PA  potassium chloride SA (KLOR-CON M) 20 MEQ tablet Take 1 tablet (20  mEq total) by mouth 2 (two) times daily. 05/11/22   Leanor Kail, PA      Allergies    Patient has no known allergies.    Review of Systems   Review of Systems  Respiratory:  Positive for shortness of breath.   Gastrointestinal:  Positive for vomiting.  All other systems reviewed and are negative.  Physical Exam Updated Vital Signs BP (!) 191/92   Pulse 66   Temp (!) 97.1 F (36.2 C) (Axillary)   Resp 15   Ht '5\' 6"'$  (1.676 m)   Wt 68 kg   SpO2 99%   BMI 24.21 kg/m  Physical Exam Vitals and nursing note reviewed.  Constitutional:      Appearance: She is well-developed.  HENT:     Head: Normocephalic.  Cardiovascular:     Rate and Rhythm: Normal rate.  Pulmonary:     Effort: Pulmonary effort is normal.  Chest:     Chest wall: No deformity.  Abdominal:     General: There is no distension.     Palpations: Abdomen is soft.  Musculoskeletal:        General: Normal range of motion.     Cervical back: Normal range of motion.     Right lower leg: Edema present.     Left lower leg: Edema present.  Neurological:     Mental Status: She  is alert and oriented to person, place, and time.    ED Results / Procedures / Treatments   Labs (all labs ordered are listed, but only abnormal results are displayed) Labs Reviewed  BASIC METABOLIC PANEL - Abnormal; Notable for the following components:      Result Value   Glucose, Bld 236 (*)    Creatinine, Ser 2.13 (*)    GFR, Estimated 24 (*)    All other components within normal limits  BRAIN NATRIURETIC PEPTIDE - Abnormal; Notable for the following components:   B Natriuretic Peptide 2,083.9 (*)    All other components within normal limits  CBC WITH DIFFERENTIAL/PLATELET    EKG EKG Interpretation  Date/Time:  Wednesday May 19 2022 10:16:46 EDT Ventricular Rate:  71 PR Interval:  228 QRS Duration: 90 QT Interval:  394 QTC Calculation: 428 R Axis:   102 Text Interpretation: Atrial-paced rhythm with prolonged AV  conduction Rightward axis Septal infarct , age undetermined Abnormal ECG Confirmed by Godfrey Pick [phone]) on 05/19/2022 10:41:54 AM  Radiology DG Chest 2 View  Result Date: 05/19/2022 CLINICAL DATA:  Shortness of breath. EXAM: CHEST - 2 VIEW COMPARISON:  12/25/2021 FINDINGS: Low volume film. Interval progression of right base collapse/consolidation with moderate right pleural effusion. Minimal atelectasis or infiltrate noted left base with probable tiny left pleural effusion. The cardio pericardial silhouette is enlarged. Dual lead permanent pacemaker evident. IMPRESSION: Interval progression of right base collapse/consolidation with moderate right pleural effusion. Electronically Signed   By: Misty Stanley M.D.   On: 05/19/2022 11:07    Procedures Procedures    Medications Ordered in ED Medications  furosemide (LASIX) injection 40 mg (40 mg Intravenous Given 05/19/22 1228)    ED Course/ Medical Decision Making/ A&P                           Medical Decision Making Pt complains of swelling in both legs and shortness of breath.  Pt reports retaining fluid for 2 weeks but today unable to walk due to swelling and shortness of breath   Problems Addressed: Edema of both lower extremities: acute illness or injury    Details: Patient reports increased swelling over the last 2 weeks severe today patient reports legs feel like they will give away  Amount and/or Complexity of Data Reviewed Independent Historian:     Details: PT's son reports pt unable to tolerate walking today due to shortness of breath External Data Reviewed: notes.    Details: Cardiology notes reviewed.  Pt has a histroy of Chf. Labs: ordered. Decision-making details documented in ED Course.    Details: Labs ordered reviewed and interpreted BNP is elevated to 2083 patient's glucose is elevated at 236 creatinine is 2.13 Radiology: ordered and independent interpretation performed. Decision-making details documented in ED Course.     Details: Chest x-ray shows interval progression of right base collapse and consolidation with moderate right pleural effusion ECG/medicine tests: ordered and independent interpretation performed. Decision-making details documented in ED Course.    Details: Cute EKG changes Discussion of management or test interpretation with external provider(s): I spoke with [facility] hospitalist Dr. Lorin Mercy who will admit patient.  Risk Prescription drug management. Risk Details: Patient given IV Lasix 40 mg and hydralazine 5 mg IV.  Patient placed on 2 L O2 by RN for comfort patient was very short of breath when moving from wheelchair to bed.  Patient reevaluated and is feeling better with oxygen and beginning diuresis.  Final Clinical Impression(s) / ED Diagnoses Final diagnoses:  Acute congestive heart failure, unspecified heart failure type ([employer])  Edema of both lower extremities    Rx / DC Orders ED Discharge Orders     None         Sidney Ace 05/19/22 1348    Godfrey Pick, MD 05/22/22 [phone]

## 2022-05-20 ENCOUNTER — Observation Stay (HOSPITAL_COMMUNITY): Payer: Medicare HMO

## 2022-05-20 ENCOUNTER — Other Ambulatory Visit (HOSPITAL_COMMUNITY): Payer: Self-pay

## 2022-05-20 DIAGNOSIS — E669 Obesity, unspecified: Secondary | ICD-10-CM | POA: Diagnosis present

## 2022-05-20 DIAGNOSIS — I5043 Acute on chronic combined systolic (congestive) and diastolic (congestive) heart failure: Secondary | ICD-10-CM | POA: Diagnosis present

## 2022-05-20 DIAGNOSIS — I5021 Acute systolic (congestive) heart failure: Secondary | ICD-10-CM | POA: Diagnosis not present

## 2022-05-20 DIAGNOSIS — I130 Hypertensive heart and chronic kidney disease with heart failure and stage 1 through stage 4 chronic kidney disease, or unspecified chronic kidney disease: Secondary | ICD-10-CM | POA: Diagnosis present

## 2022-05-20 DIAGNOSIS — I5023 Acute on chronic systolic (congestive) heart failure: Secondary | ICD-10-CM | POA: Diagnosis not present

## 2022-05-20 DIAGNOSIS — R600 Localized edema: Secondary | ICD-10-CM

## 2022-05-20 DIAGNOSIS — R0602 Shortness of breath: Secondary | ICD-10-CM | POA: Diagnosis present

## 2022-05-20 DIAGNOSIS — T502X5A Adverse effect of carbonic-anhydrase inhibitors, benzothiadiazides and other diuretics, initial encounter: Secondary | ICD-10-CM | POA: Diagnosis present

## 2022-05-20 DIAGNOSIS — I5033 Acute on chronic diastolic (congestive) heart failure: Secondary | ICD-10-CM | POA: Diagnosis not present

## 2022-05-20 DIAGNOSIS — I480 Paroxysmal atrial fibrillation: Secondary | ICD-10-CM | POA: Diagnosis present

## 2022-05-20 DIAGNOSIS — K8010 Calculus of gallbladder with chronic cholecystitis without obstruction: Secondary | ICD-10-CM | POA: Diagnosis present

## 2022-05-20 DIAGNOSIS — I495 Sick sinus syndrome: Secondary | ICD-10-CM | POA: Diagnosis present

## 2022-05-20 DIAGNOSIS — N179 Acute kidney failure, unspecified: Secondary | ICD-10-CM | POA: Diagnosis not present

## 2022-05-20 DIAGNOSIS — R42 Dizziness and giddiness: Secondary | ICD-10-CM | POA: Diagnosis not present

## 2022-05-20 DIAGNOSIS — N170 Acute kidney failure with tubular necrosis: Secondary | ICD-10-CM | POA: Diagnosis present

## 2022-05-20 DIAGNOSIS — N1832 Chronic kidney disease, stage 3b: Secondary | ICD-10-CM | POA: Diagnosis not present

## 2022-05-20 DIAGNOSIS — D631 Anemia in chronic kidney disease: Secondary | ICD-10-CM | POA: Diagnosis present

## 2022-05-20 DIAGNOSIS — K811 Chronic cholecystitis: Secondary | ICD-10-CM | POA: Diagnosis not present

## 2022-05-20 DIAGNOSIS — Z79899 Other long term (current) drug therapy: Secondary | ICD-10-CM | POA: Diagnosis not present

## 2022-05-20 DIAGNOSIS — N184 Chronic kidney disease, stage 4 (severe): Secondary | ICD-10-CM

## 2022-05-20 DIAGNOSIS — E8809 Other disorders of plasma-protein metabolism, not elsewhere classified: Secondary | ICD-10-CM | POA: Diagnosis not present

## 2022-05-20 DIAGNOSIS — K819 Cholecystitis, unspecified: Secondary | ICD-10-CM | POA: Diagnosis not present

## 2022-05-20 DIAGNOSIS — Z833 Family history of diabetes mellitus: Secondary | ICD-10-CM | POA: Diagnosis not present

## 2022-05-20 DIAGNOSIS — E440 Moderate protein-calorie malnutrition: Secondary | ICD-10-CM | POA: Diagnosis present

## 2022-05-20 DIAGNOSIS — E46 Unspecified protein-calorie malnutrition: Secondary | ICD-10-CM | POA: Diagnosis not present

## 2022-05-20 DIAGNOSIS — Z86718 Personal history of other venous thrombosis and embolism: Secondary | ICD-10-CM | POA: Diagnosis not present

## 2022-05-20 DIAGNOSIS — N390 Urinary tract infection, site not specified: Secondary | ICD-10-CM | POA: Diagnosis not present

## 2022-05-20 DIAGNOSIS — E1122 Type 2 diabetes mellitus with diabetic chronic kidney disease: Secondary | ICD-10-CM | POA: Diagnosis present

## 2022-05-20 DIAGNOSIS — I509 Heart failure, unspecified: Secondary | ICD-10-CM | POA: Insufficient documentation

## 2022-05-20 DIAGNOSIS — I639 Cerebral infarction, unspecified: Secondary | ICD-10-CM | POA: Diagnosis not present

## 2022-05-20 DIAGNOSIS — Z8673 Personal history of transient ischemic attack (TIA), and cerebral infarction without residual deficits: Secondary | ICD-10-CM | POA: Diagnosis not present

## 2022-05-20 DIAGNOSIS — Z7984 Long term (current) use of oral hypoglycemic drugs: Secondary | ICD-10-CM | POA: Diagnosis not present

## 2022-05-20 DIAGNOSIS — I10 Essential (primary) hypertension: Secondary | ICD-10-CM

## 2022-05-20 DIAGNOSIS — M1990 Unspecified osteoarthritis, unspecified site: Secondary | ICD-10-CM | POA: Diagnosis present

## 2022-05-20 DIAGNOSIS — Z7901 Long term (current) use of anticoagulants: Secondary | ICD-10-CM | POA: Diagnosis not present

## 2022-05-20 DIAGNOSIS — J9601 Acute respiratory failure with hypoxia: Secondary | ICD-10-CM | POA: Diagnosis not present

## 2022-05-20 LAB — ECHOCARDIOGRAM COMPLETE
Area-P 1/2: 2.99 cm2
Calc EF: 56.4 %
Height: 66 in
S' Lateral: 3.2 cm
Single Plane A2C EF: 54.7 %
Single Plane A4C EF: 56.7 %
Weight: 2400 oz

## 2022-05-20 LAB — GLUCOSE, CAPILLARY
Glucose-Capillary: 245 mg/dL — ABNORMAL HIGH (ref 70–99)
Glucose-Capillary: 221 mg/dL — ABNORMAL HIGH (ref 70–99)
Glucose-Capillary: 281 mg/dL — ABNORMAL HIGH (ref 70–99)
Glucose-Capillary: 202 mg/dL — ABNORMAL HIGH (ref 70–99)
Glucose-Capillary: 248 mg/dL — ABNORMAL HIGH (ref 70–99)

## 2022-05-20 LAB — CBC
HCT: 35.2 % — ABNORMAL LOW (ref 36.0–46.0)
Hemoglobin: 11.1 g/dL — ABNORMAL LOW (ref 12.0–15.0)
MCH: 28.8 pg (ref 26.0–34.0)
MCHC: 31.5 g/dL (ref 30.0–36.0)
MCV: 91.2 fL (ref 80.0–100.0)
Platelets: 298 10*3/uL (ref 150–400)
RBC: 3.86 MIL/uL — ABNORMAL LOW (ref 3.87–5.11)
RDW: 15.5 % (ref 11.5–15.5)
WBC: 6.9 10*3/uL (ref 4.0–10.5)
nRBC: 0 % (ref 0.0–0.2)

## 2022-05-20 LAB — BASIC METABOLIC PANEL
Anion gap: 7 (ref 5–15)
BUN: 19 mg/dL (ref 8–23)
CO2: 23 mmol/L (ref 22–32)
Calcium: 8.3 mg/dL — ABNORMAL LOW (ref 8.9–10.3)
Chloride: 107 mmol/L (ref 98–111)
Creatinine, Ser: 2.37 mg/dL — ABNORMAL HIGH (ref 0.44–1.00)
GFR, Estimated: 22 mL/min — ABNORMAL LOW (ref >60)
Glucose, Bld: 250 mg/dL — ABNORMAL HIGH (ref 70–99)
Potassium: 4.1 mmol/L (ref 3.5–5.1)
Sodium: 137 mmol/L (ref 135–145)

## 2022-05-20 LAB — MAGNESIUM: Magnesium: 1.8 mg/dL (ref 1.7–2.4)

## 2022-05-20 MED ORDER — GLUCERNA SHAKE PO LIQD
237.0000 mL | Freq: Three times a day (TID) | ORAL | Status: DC
  Administered 2022-05-20 – 2022-06-09 (×55): 237 mL via ORAL

## 2022-05-20 MED ORDER — ADULT MULTIVITAMIN W/MINERALS CH
1.0000 | ORAL_TABLET | Freq: Every day | ORAL | Status: DC
  Administered 2022-05-20 – 2022-06-09 (×21): 1 via ORAL
  Filled 2022-05-20 (×21): qty 1

## 2022-05-20 MED ORDER — INSULIN GLARGINE-YFGN 100 UNIT/ML ~~LOC~~ SOLN
8.0000 [IU] | Freq: Every day | SUBCUTANEOUS | Status: DC
  Administered 2022-05-20 – 2022-05-21 (×2): 8 [IU] via SUBCUTANEOUS
  Filled 2022-05-20 (×2): qty 0.08

## 2022-05-20 NOTE — Progress Notes (Signed)
  Echocardiogram 2D Echocardiogram has been performed.  Tracey Bryant 05/20/2022, 11:53 AM

## 2022-05-20 NOTE — Progress Notes (Addendum)
Mobility Specialist Progress Note:   05/20/22 1326  Mobility  Activity Ambulated with assistance in hallway  Level of Assistance Minimal assist, patient does 75% or more  Assistive Device Front wheel walker  Distance Ambulated (ft) 40 ft  Activity Response RN notified  $Mobility charge 1 Mobility   Pt received in bed willing to participate in mobility. Complaints of overall weakness. Pt minA to stand and throughout ambulation. Upon returning to the room pt started feeling more weak. Pt had a LOB requiring minA to correct. Pt began backing up to chair and as she was sitting down she stopped talking and was blank staring and the NT and I. Pt came back to and said she was just weak. VS stable, RN and NT in room.   BP: 128/98 SpO2: 97% RA HR: [place] Specialist

## 2022-05-20 NOTE — Progress Notes (Signed)
Progress Note  Patient Name: Tracey Bryant Date of Encounter: 05/20/2022  [facility] Cardiologist: Minus Breeding, MD   Subjective   Feels better.  Breathing has improved, particularly after thoracentesis yesterday.  Mild diuresis yesterday with only net negative -330.  Inpatient Medications    Scheduled Meds:  apixaban  5 mg Oral BID   carvedilol  6.25 mg Oral BID WC   docusate sodium  100 mg Oral BID   furosemide  60 mg Intravenous BID   hydrALAZINE  25 mg Oral Q8H   insulin aspart  0-15 Units Subcutaneous TID WC   insulin aspart  0-5 Units Subcutaneous QHS   insulin glargine-yfgn  8 Units Subcutaneous Daily   isosorbide mononitrate  15 mg Oral Daily   sodium chloride flush  3 mL Intravenous Q12H   Continuous Infusions:  PRN Meds: acetaminophen **OR** acetaminophen, bisacodyl, hydrALAZINE, morphine injection, ondansetron **OR** ondansetron (ZOFRAN) IV, oxyCODONE, polyethylene glycol, traZODone   Vital Signs    Vitals:   05/19/22 1932 05/20/22 0310 05/20/22 0731 05/20/22 0856  BP: (!) 151/76 (!) 124/58 126/67   Pulse: 73  60 61  Resp: '15 17 18 16  '$ Temp: 97.6 F (36.4 C)  97.9 F (36.6 C)   TempSrc: Oral  Oral   SpO2: 95%  96%   Weight:      Height:        Intake/Output Summary (Last 24 hours) at 05/20/2022 1140 Last data filed at 05/20/2022 0900 Gross per 24 hour  Intake 957 ml  Output 1050 ml  Net -93 ml      05/19/2022   10:21 AM 01/08/2022    7:20 AM 01/07/2022    8:15 AM  Last 3 Weights  Weight (lbs) 150 lb 150 lb 150 lb 12.8 oz  Weight (kg) 68.04 kg 68.04 kg 68.402 kg      Telemetry    Paced- Personally Reviewed  ECG    Atrial pacing  Physical Exam   GEN: No acute distress.  Appears chronically ill Neck: No JVD Cardiac: RRR, no murmurs, rubs, or gallops.  Respiratory: Clear to auscultation bilaterally. GI: Soft, nontender, non-distended  MS: 1+ lower extremity pitting edema bilaterally-improved; No deformity. Neuro:  Nonfocal   Psych: Normal affect   Labs    High Sensitivity Troponin:  No results for input(s): TROPONINIHS in the last 720 hours.   Chemistry Recent Labs  Lab 05/19/22 1040 05/20/22 0438  NA 139 137  K 3.9 4.1  CL 108 107  CO2 22 23  GLUCOSE 236* 250*  BUN 15 19  CREATININE 2.13* 2.37*  CALCIUM 8.9 8.3*  MG  --  1.8  GFRNONAA 24* 22*  ANIONGAP 9 7    Lipids No results for input(s): CHOL, TRIG, HDL, LABVLDL, LDLCALC, CHOLHDL in the last 168 hours.  Hematology Recent Labs  Lab 05/19/22 1040 05/20/22 0438  WBC 8.5 6.9  RBC 4.32 3.86*  HGB 12.4 11.1*  HCT 39.8 35.2*  MCV 92.1 91.2  MCH 28.7 28.8  MCHC 31.2 31.5  RDW 15.0 15.5  PLT 371 298   Thyroid No results for input(s): TSH, FREET4 in the last 168 hours.  BNP Recent Labs  Lab 05/19/22 1040  BNP 2,083.9*    DDimer No results for input(s): DDIMER in the last 168 hours.   Radiology    DG Chest 2 View  Result Date: 05/19/2022 CLINICAL DATA:  Shortness of breath. EXAM: CHEST - 2 VIEW COMPARISON:  12/25/2021 FINDINGS: Low volume film. Interval  progression of right base collapse/consolidation with moderate right pleural effusion. Minimal atelectasis or infiltrate noted left base with probable tiny left pleural effusion. The cardio pericardial silhouette is enlarged. Dual lead permanent pacemaker evident. IMPRESSION: Interval progression of right base collapse/consolidation with moderate right pleural effusion. Electronically Signed   By: Misty Stanley M.D.   On: 05/19/2022 11:07   DG CHEST PORT 1 VIEW  Result Date: 05/19/2022 CLINICAL DATA:  Shortness of breath, respiratory distress EXAM: PORTABLE CHEST 1 VIEW COMPARISON:  05/19/2022 at 1123 hours FINDINGS: Small right pleural effusion, improved. Left lung is clear. No frank interstitial edema. Cardiomegaly. Thoracic aortic atherosclerosis. Left subclavian pacemaker. IMPRESSION: Small right pleural effusion, improved. No frank interstitial edema. Electronically Signed   By:  Julian Hy M.D.   On: 05/19/2022 17:32    Cardiac Studies   EF 40 to 45% in December 2022  Patient Profile     71 y.o. female acute on chronic systolic heart failure.  Assessment & Plan    Acute on chronic systolic/diastolic heart failure: Has received several doses of diuretics.  Creatinine has increased.  Baseline creatinine was around 1.6 and was last noted several months ago.  Will hold off on adding more diuretics right now.  She did lose over a liter of fluid yesterday from her thoracentesis and her breathing is better.  We will see if creatinine improves tomorrow.  She is able to breathe on room air.  Her renal insufficiency will be a chronic issue that limits diuresis.  Continue with attempts at good blood pressure control.    Eliquis for stroke prevention in the setting of A-fib.  Hypertension: Continue carvedilol.  Readings improved today.     For questions or updates, please contact [place] Please consult [email or web address] for contact info under        Signed, Larae Grooms, MD  05/20/2022, 11:40 AM

## 2022-05-20 NOTE — TOC Initial Note (Signed)
Transition of Care [facility]) - Initial/Assessment Note    Patient Details  Name: Tracey Bryant MRN: [id number] Date of Birth: 1951-01-28  Transition of Care [facility]) CM/SW Contact:    Verdell Carmine, RN Phone Number: 05/20/2022, 3:30 PM  Clinical Narrative:                  71 year old patient with  history of DM, PAF HPTN, Chronic CHF and CVA presenting with SHOB acute.   Had plural effusion which was tapped, decrease in [facility]. EF 50-55% from ECHO today.Being diuresed.   Patient is mobilizing with PT OT and mobility tech. She is having some difficulty, PT and OT recommended Home Health.   CM will follow and speak with patient regarding [facility]    Expected Discharge Plan: [place] Barriers to Discharge: Continued Medical Work up   Patient Goals and CMS Choice        Expected Discharge Plan and Services Expected Discharge Plan: [place]       Living arrangements for the past 2 months: Single Family Home                                      Prior Living Arrangements/Services Living arrangements for the past 2 months: Single Family Home   Patient language and need for interpreter reviewed:: Yes        Need for Family Participation in Patient Care: Yes (Comment) Care giver support system in place?: Yes (comment)   Criminal Activity/Legal Involvement Pertinent to Current Situation/Hospitalization: No - Comment as needed  Activities of Daily Living      Permission Sought/Granted                  Emotional Assessment Appearance:: Appears stated age     Orientation: : Oriented to Self, Oriented to Place, Oriented to  Time, Oriented to Situation Alcohol / Substance Use: Not Applicable Psych Involvement: No (comment)  Admission diagnosis:  Acute on chronic systolic CHF (congestive heart failure) (HCC) [I50.23] Acute congestive heart failure, unspecified heart failure type ([place]) [I50.9] Edema of both lower extremities  [R60.0] Acute on chronic congestive heart failure (HCC) [I50.9] Patient Active Problem List   Diagnosis Date Noted   Acute on chronic congestive heart failure ([place]) 05/20/2022   Acute on chronic systolic CHF (congestive heart failure) ([place]) 05/19/2022   Pleural effusion due to CHF (congestive heart failure) ([place]) 05/19/2022   Acute respiratory failure with hypoxia ([place]) 12/26/2021   Elevated troponin 12/26/2021   Elevated brain natriuretic peptide (BNP) level 12/26/2021   Hypoalbuminemia due to protein-calorie malnutrition ([place]) 12/26/2021   CKD (chronic kidney disease), stage IV ([place]) 12/26/2021   Acute exacerbation of CHF (congestive heart failure) ([place]) 12/25/2021   Acute on chronic HFrEF (heart failure with reduced ejection fraction) ([place]) 07/27/2021   Acute on chronic combined systolic and diastolic congestive heart failure ([place]) 07/26/2021   Paroxysmal atrial fibrillation ([place]) 07/26/2021   DVT (deep venous thrombosis) ([place]) 07/26/2021   Normocytic anemia 07/26/2021   Cholecystostomy tube dysfunction 07/26/2021   Moderate protein-calorie malnutrition ([place]) 07/26/2021   Posterior cerebral artery embolism, right    Severe sepsis ([place]) 03/19/2021   Acute cholecystitis 03/19/2021   Acute lower UTI 03/19/2021   Cholelithiasis without cholangitis 03/18/2021   Uncontrolled type 2 diabetes mellitus with hyperglycemia, with long-term current use of insulin ([place]) 03/18/2021  DKA (diabetic ketoacidosis) ([place]) 03/16/2021   AKI (acute kidney injury) ([place]) 03/16/2021   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[place]) 03/16/2021   Acute posthemorrhagic anemia 03/16/2021   Epistaxis 03/16/2021   Chronic anticoagulation 03/16/2021   CVA (cerebral vascular accident) ([place]) 03/16/2021   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) 02/15/2013   Hypertension 02/15/2013   Gall bladder stones 02/15/2013   Multiple lung nodules 02/15/2013   PCP:  Coolidge Breeze,  FNP Pharmacy:   [facility] [address], [place] - [place] [address] [id number] [id number] [place] [address] Phone: [phone] Fax: [phone]     Social Determinants of Health (SDOH) Interventions    Readmission Risk Interventions    12/27/2021    2:32 PM 03/19/2021    9:34 AM 03/17/2021   11:56 AM  Readmission Risk Prevention Plan  Transportation Screening Complete  Complete  PCP or Specialist Appt within 5-7 Days  Complete   PCP or Specialist Appt within 3-5 Days Complete    Home Care Screening  Complete   Medication Review (RN CM)   Complete  HRI or Home Care Consult Complete    Social Work Consult for [place] Planning/Counseling Complete    Palliative Care Screening Not Applicable    Medication Review Press photographer) Complete

## 2022-05-20 NOTE — Progress Notes (Signed)
Patient passed out attempting to stand up.

## 2022-05-20 NOTE — Progress Notes (Incomplete)
Echocardiogram not completed. Patient undergoing therapy, echo will be re-attempted at a later time.  Tracey Bryant

## 2022-05-20 NOTE — Progress Notes (Signed)
PROGRESS NOTE    Tracey Bryant  UXN:[id number] DOB: 14-Nov-1951 DOA: 05/19/2022 PCP: Coolidge Breeze, FNP    Brief Narrative:  Tracey Bryant is a 71 y.o. female with medical history significant of PAF; HTN; DM; chronic systolic CHF; and CVA presenting with SOB. She reports that 2-3 weeks ago she was overloaded.  Her doctor increased her medications but it doesn't seem to be getting the fluid off.  She has SOB.  She is too SOB to even walk.  Her weight is up - was 150 -> 204.  S/p thoracentesis.      Assessment and Plan: Acute on chronic systolic CHF -known h/o chronic systolic CHF presenting with worsening SOB and edema -echocardiogram, last done in December (EF 40-45%) -Continue Coreg, no ACE due to renal dysfunction -Cardiology consult -IV lasix -wean o2 -daily weights Strict I/os   R pleural effusion -Patient with prior pleural effusion requiring thoracentesis  -s/p thoracentesis- 1300 cc   Afib -Rate controlled with Coreg -Has pacemaker -Continue Eliquis   HTN -Continue Coreg, hydralazine   DM -Last A1c was 7.7, indicating suboptimal control -cover with moderate-scale SSI for now   H/o CVA, CAD -Not on ASA -Continue Imdur   Stage 4 CKD -Appears to be stable at this time -Will follow with diuresis    DVT prophylaxis:  apixaban (ELIQUIS) tablet 5 mg    Code Status: Full Code Family Communication:   Disposition Plan:  Level of care: Telemetry Cardiac Status is: Observation The patient will require care spanning > 2 midnights and should be moved to inpatient because: diuresis,  PT eval    Consultants:  cards   Subjective: Breathing better after thoracentesis Son got her a scale  Objective: Vitals:   05/19/22 1932 05/20/22 0310 05/20/22 0731 05/20/22 0856  BP: (!) 151/76 (!) 124/58 126/67   Pulse: 73  60 61  Resp: '15 17 18 16  '$ Temp: 97.6 F (36.4 C)  97.9 F (36.6 C)   TempSrc: Oral  Oral   SpO2: 95%  96%   Weight:      Height:         Intake/Output Summary (Last 24 hours) at 05/20/2022 1104 Last data filed at 05/20/2022 0900 Gross per 24 hour  Intake 957 ml  Output 1050 ml  Net -93 ml   Filed Weights   05/19/22 1021  Weight: 68 kg    Examination:   General: Appearance:    Well developed, well nourished female in no acute distress     Lungs:     respirations unlabored  Heart:    Normal heart rate.    MS:   All extremities are intact.    Neurologic:   Awake, alert, oriented x 3. No apparent focal neurological           defect.        Data Reviewed: I have personally reviewed following labs and imaging studies  CBC: Recent Labs  Lab 05/19/22 1040 05/20/22 0438  WBC 8.5 6.9  NEUTROABS 5.7  --   HGB 12.4 11.1*  HCT 39.8 35.2*  MCV 92.1 91.2  PLT 371 254   Basic Metabolic Panel: Recent Labs  Lab 05/19/22 1040 05/20/22 0438  NA 139 137  K 3.9 4.1  CL 108 107  CO2 22 23  GLUCOSE 236* 250*  BUN 15 19  CREATININE 2.13* 2.37*  CALCIUM 8.9 8.3*  MG  --  1.8   GFR: Estimated Creatinine Clearance: 20.7 mL/min (A) (by  C-G formula based on SCr of 2.37 mg/dL (H)). Liver Function Tests: No results for input(s): AST, ALT, ALKPHOS, BILITOT, PROT, ALBUMIN in the last 168 hours. No results for input(s): LIPASE, AMYLASE in the last 168 hours. No results for input(s): AMMONIA in the last 168 hours. Coagulation Profile: No results for input(s): INR, PROTIME in the last 168 hours. Cardiac Enzymes: No results for input(s): CKTOTAL, CKMB, CKMBINDEX, TROPONINI in the last 168 hours. BNP (last 3 results) Recent Labs    05/04/22 1625  PROBNP 15,044*   HbA1C: No results for input(s): HGBA1C in the last 72 hours. CBG: Recent Labs  Lab 05/19/22 1828 05/19/22 2107 05/20/22 0622 05/20/22 1101  GLUCAP 221* 242* 245* 221*   Lipid Profile: No results for input(s): CHOL, HDL, LDLCALC, TRIG, CHOLHDL, LDLDIRECT in the last 72 hours. Thyroid Function Tests: No results for input(s): TSH, T4TOTAL,  FREET4, T3FREE, THYROIDAB in the last 72 hours. Anemia Panel: No results for input(s): VITAMINB12, FOLATE, FERRITIN, TIBC, IRON, RETICCTPCT in the last 72 hours. Sepsis Labs: No results for input(s): PROCALCITON, LATICACIDVEN in the last 168 hours.  No results found for this or any previous visit (from the past 240 hour(s)).       Radiology Studies: DG Chest 2 View  Result Date: 05/19/2022 CLINICAL DATA:  Shortness of breath. EXAM: CHEST - 2 VIEW COMPARISON:  12/25/2021 FINDINGS: Low volume film. Interval progression of right base collapse/consolidation with moderate right pleural effusion. Minimal atelectasis or infiltrate noted left base with probable tiny left pleural effusion. The cardio pericardial silhouette is enlarged. Dual lead permanent pacemaker evident. IMPRESSION: Interval progression of right base collapse/consolidation with moderate right pleural effusion. Electronically Signed   By: Misty Stanley M.D.   On: 05/19/2022 11:07   DG CHEST PORT 1 VIEW  Result Date: 05/19/2022 CLINICAL DATA:  Shortness of breath, respiratory distress EXAM: PORTABLE CHEST 1 VIEW COMPARISON:  05/19/2022 at 1123 hours FINDINGS: Small right pleural effusion, improved. Left lung is clear. No frank interstitial edema. Cardiomegaly. Thoracic aortic atherosclerosis. Left subclavian pacemaker. IMPRESSION: Small right pleural effusion, improved. No frank interstitial edema. Electronically Signed   By: Julian Hy M.D.   On: 05/19/2022 17:32        Scheduled Meds:  apixaban  5 mg Oral BID   carvedilol  6.25 mg Oral BID WC   docusate sodium  100 mg Oral BID   furosemide  60 mg Intravenous BID   hydrALAZINE  25 mg Oral Q8H   insulin aspart  0-15 Units Subcutaneous TID WC   insulin aspart  0-5 Units Subcutaneous QHS   isosorbide mononitrate  15 mg Oral Daily   sodium chloride flush  3 mL Intravenous Q12H   Continuous Infusions:   LOS: 0 days    Time spent: 35 minutes spent on chart review,  discussion with nursing staff, consultants, updating family and interview/physical exam; more than 50% of that time was spent in counseling and/or coordination of care.    Geradine Girt, DO [facility] Available via Epic secure chat 7am-7pm After these hours, please refer to coverage provider listed on [email or web address] 05/20/2022, 11:04 AM

## 2022-05-20 NOTE — Evaluation (Signed)
Occupational Therapy Evaluation Patient Details Name: Tracey Bryant MRN: [id number] DOB: November 09, 1951 Today's Date: 05/20/2022   History of Present Illness The pt is a 71 yo female presenting from home 5/24 with SOB and BLE swelling. Found to have acute CHF exacerbation and R pleural effusion s/p thoracentesis 5/24. PMH includes: arthritis, CHF, DM II, DVT, HTN, afib, and stroke.   Clinical Impression   Pt admitted as above presenting with deficits as listed below. Anxiety was limiting factor but with reassurance and verbal education AY:TKZS by step plan, pt did well. Pt was 96-97%on 1L O2 via [place] "for comfort", MD verbally asked therapy to wean if possible. Pt O2 was removed for therapy session and pt O2 sats remained in 93-97% range on RA. Discussed possibltiy of HHOT vs SNF rehab to assist in maximizing independence with ADL's, functional transfers, energy conservation and DME/adaptive equipment, increased activity tolerance etc.. Pt reports wanting to go home but feeling anxious about therapy at home or SNF. Pt is limited by decreased activity tolerance, poor endurance overall. She is mod A LB ADL's and Min A functional transfers. Will follow acutely.     Recommendations for follow up therapy are one component of a multi-disciplinary discharge planning process, led by the attending physician.  Recommendations may be updated based on patient status, additional functional criteria and insurance authorization.   Follow Up Recommendations  Other (comment) (Dependent on pt progress. Rec HHOT if pt is willing)    Assistance Recommended at Discharge Intermittent Supervision/Assistance  Patient can return home with the following A little help with walking and/or transfers;A little help with bathing/dressing/bathroom;Assistance with cooking/housework;Help with stairs or ramp for entrance    Functional Status Assessment  Patient has had a recent decline in their functional status and demonstrates the  ability to make significant improvements in function in a reasonable and predictable amount of time.  Equipment Recommendations  Other (comment) (Cont to assess in functional context)    Recommendations for Other Services       Precautions / Restrictions Precautions Precautions: Fall Precaution Comments: poor endurance and awareness of deficits Restrictions Weight Bearing Restrictions: No      Mobility Bed Mobility Overal bed mobility: Needs Assistance Bed Mobility: Supine to Sit     Supine to sit: Min assist, HOB elevated          Transfers Overall transfer level: Needs assistance Equipment used: Rolling walker (2 wheels) Transfers: Sit to/from Stand Sit to Stand: Min assist     General transfer comment: minA to rise from EOB with BUE support      Balance Overall balance assessment: Needs assistance Sitting-balance support: Single extremity supported, No upper extremity supported, Feet supported Sitting balance-Leahy Scale: Fair     Standing balance support: Bilateral upper extremity supported, Reliant on assistive device for balance Standing balance-Leahy Scale: Poor Standing balance comment: Reliant on RW for balance     ADL either performed or assessed with clinical judgement   ADL Overall ADL's : Needs assistance/impaired Eating/Feeding: Set up;Sitting   Grooming: Set up;Sitting;Modified independent   Upper Body Bathing: Modified independent;Set up;Sitting Upper Body Bathing Details (indicate cue type and reason): Increased time for tasks Lower Body Bathing: Sit to/from stand;Sitting/lateral leans;Moderate assistance   Upper Body Dressing : Set up;Sitting   Lower Body Dressing: Moderate assistance;Sitting/lateral leans;Sit to/from stand   Toilet Transfer: Minimal assistance;Moderate assistance;Cueing for safety;Ambulation;Cueing for sequencing;Rolling walker (2 wheels) Toilet Transfer Details (indicate cue type and reason): Simulated ambulating on  room from EOB, around  bed and to recliner chair Toileting- Clothing Manipulation and Hygiene: Sitting/lateral lean;Sit to/from stand;Minimal assistance;Moderate assistance       Functional mobility during ADLs: Minimal assistance;Rolling walker (2 wheels);Cueing for safety;Cueing for sequencing General ADL Comments: Pt seen for OT assessment. She was educated in role of OT and expressed anxiety WC:BJSEGBTDVV mobility but was "willing to try". Pt admitted as above presenting with deficits as listed below. Anxiety was limiting factor but with reassurance and verbal education OH:YWVP by step plan, pt did well. Pt was 96-97%on 1L O2 via [place] "for comfort", MD verbally asked therapy to wean if possible. Pt O2 was removed for therapy session and pt O2 sats remained in 93-97% range on RA. Discussed possibltiy of HHOT vs SNF rehab to assist in maximizing independence with ADL's, functional transfers, energy conservation and DME/adaptive equipment, increased activity tolerance etc.. Pt reports wanting to go home but feeling anxious about therapy at home or SNF. Pt is limited by decreased activity tolerance, poor endurance overall. She is mod A LB ADL's and Min A functional transfers. Will follow acutely.     Vision Ability to See in Adequate Light: 0 Adequate Vision Assessment?: No apparent visual deficits            Pertinent Vitals/Pain Pain Assessment Pain Assessment: No/denies pain     Hand Dominance Right   Extremity/Trunk Assessment Upper Extremity Assessment Upper Extremity Assessment: Generalized weakness;Overall [facility] for tasks assessed   Lower Extremity Assessment Lower Extremity Assessment: Defer to PT evaluation   Cervical / Trunk Assessment Cervical / Trunk Assessment: Kyphotic;Other exceptions Cervical / Trunk Exceptions: large body habitus   Communication Communication Communication: No difficulties   Cognition Arousal/Alertness: Awake/alert Behavior During Therapy: WFL for  tasks assessed/performed Overall Cognitive Status: Within Functional Limits for tasks assessed       General Comments: pt with poor short term memory, sharing the same information multiple times through the session, pt with knees buckling and stating she is anxious and fatigued, pt states she usually tries to push through being tired even if it is unsafe     General Comments   Pt plans to d/c home if able            Home Living Family/patient expects to be discharged to:: Private residence Living Arrangements: Alone Available Help at Discharge: Family;Available 24 hours/day Type of Home: House Home Access: Stairs to enter [employer] of Steps: 3 Entrance Stairs-Rails: Can reach both;Right;Left Home Layout: One level     Bathroom Shower/Tub: Teacher, early years/pre: Standard Bathroom Accessibility: Yes   Home Equipment: Rollator (4 wheels);Shower seat;BSC/3in1;Grab bars - Statistician (2 wheels);Cane - single point   Additional Comments: Patient daughter lives next door and she and her teenage children assist PRN      Prior Functioning/Environment Prior Level of Function : Independent/Modified Independent     Mobility Comments: household ambulator using RW or cane, furniture, uses rollator for short community distances/to get mail etc. pt reports son hurt himself assisting her with car transfer reccently. ADLs Comments: assistance from family as needed, Does own laundry and cooking. Uses delivery service for groceries/shopping        OT Problem List: Decreased knowledge of use of DME or AE;Decreased knowledge of precautions;Decreased activity tolerance;Cardiopulmonary status limiting activity;Impaired balance (sitting and/or standing);Decreased safety awareness      OT Treatment/Interventions: Self-care/ADL training;Patient/family education;Energy conservation;Therapeutic activities;DME and/or AE instruction    OT Goals(Current goals  can be found in the care plan section)  Acute Rehab OT Goals Patient Stated Goal: Go home. OT Goal Formulation: With patient Time For Goal Achievement: 06/03/22 Potential to Achieve Goals: Fair  OT Frequency: Min 2X/week       AM-PAC OT "6 Clicks" Daily Activity     Outcome Measure Help from another person eating meals?: None Help from another person taking care of personal grooming?: A Little Help from another person toileting, which includes using toliet, bedpan, or urinal?: A Little Help from another person bathing (including washing, rinsing, drying)?: A Lot Help from another person to put on and taking off regular upper body clothing?: A Little Help from another person to put on and taking off regular lower body clothing?: A Lot 6 Click Score: 17   End of Session Equipment Utilized During Treatment: Gait belt;Rolling walker (2 wheels) Nurse Communication: Mobility status  Activity Tolerance: Patient limited by fatigue;Patient tolerated treatment well Patient left: in chair;with call bell/phone within reach  OT Visit Diagnosis: Unsteadiness on feet (R26.81);Muscle weakness (generalized) (M62.81)                Time: 0822-0903 OT Time Calculation (min): 41 min Charges:  OT General Charges $OT Visit: 1 Visit OT Evaluation $OT Eval Low Complexity: 1 Low OT Treatments $Self Care/Home Management : 8-22 mins  Tysen Roesler Beth Dixon, OTR/L 05/20/2022, 10:52 AM

## 2022-05-20 NOTE — Progress Notes (Signed)
Heart Failure Navigator Progress Note  Following this hospitalization to assess for HV TOC readiness.   Echo pending?  Rosmarie Esquibel, BSN, RN Heart Failure Nurse Navigator Secure Chat Only  

## 2022-05-20 NOTE — Progress Notes (Addendum)
Inpatient Diabetes Program Recommendations  AACE/ADA: New Consensus Statement on Inpatient Glycemic Control (2015)  Target Ranges:  Prepandial:   less than 140 mg/dL      Peak postprandial:   less than 180 mg/dL (1-2 hours)      Critically ill patients:  140 - 180 mg/dL   Lab Results  Component Value Date   GLUCAP 221 (H) 05/20/2022   HGBA1C 7.7 (H) 12/29/2021    Review of Glycemic Control  Latest Reference Range & Units 05/19/22 18:28 05/19/22 21:07 05/20/22 06:22 05/20/22 11:01  Glucose-Capillary 70 - 99 mg/dL 221 (H) 242 (H) 245 (H) 221 (H)   Diabetes history: DM 2 Outpatient Diabetes medications:  Glucotrol 5 mg daily (not taking) Current orders for Inpatient glycemic control:  Novolog moderate tid with meals and HS  Inpatient Diabetes Program Recommendations:   Note patient was previously on insulin per chart review.  May consider adding Semglee 10 units daily while in the hospital.  Will follow.   Thanks,  Adah Perl, RN, BC-ADM Inpatient Diabetes Coordinator Pager [phone]  (8a-5p)  Addendum: spoke to patient regarding home medications.  She states that she recently ran out of pills for diabetes.  She had been on insulin in the past, however states that her PCP stopped it.  May consider adding A1c to labs as well.

## 2022-05-20 NOTE — Progress Notes (Signed)
Pt just got light headed and states she feels weak. she ate and was ambulating in the room with the Mobility specialist and per her, the patient just went blank. Pt states she had  vertigo in the past, and it would make her pass out.

## 2022-05-20 NOTE — Progress Notes (Signed)
Patient with undocumented biliary drain placed from previous admission.  Per patient, the drain has not had any output and on assessment, bag still empty.  Sutures appear to be dislodged. Patient with no complaints of pain or discomfort. New dressing placed. MD notified.

## 2022-05-20 NOTE — Progress Notes (Signed)
Initial Nutrition Assessment  DOCUMENTATION CODES:   Not applicable  INTERVENTION:   - Glucerna Shake po TID, each supplement provides 220 kcal and 10 grams of protein  - MVI with minerals daily  - Encourage PO intake  NUTRITION DIAGNOSIS:   Increased nutrient needs related to chronic illness (CHF) as evidenced by estimated needs.  GOAL:   Patient will meet greater than or equal to 90% of their needs  MONITOR:   PO intake, Supplement acceptance, I & O's, Weight trends, Labs  REASON FOR ASSESSMENT:   Consult Other ("nutritional goals")  ASSESSMENT:   71 year old female who presented to the ED on 5/24 with SOB. PMH of PAF, HTN, DM, CHF, CVA. Pt admitted with acute on chronic systolic CHF, R pleural effusion.  05/24 - s/p thoracentesis with 1300 ml fluid removed  Spoke with pt at bedside. Pt reports good appetite and states that she is eating well. She reports eating 100% of her breakfast and everything from her lunch tray except the meat because she was unable to chew it due to not having teeth. RD offered to downgrade for ease of intake but pt declined, stating that as long as she was able to order her meals, she could find plenty of things to eat.  Pt reports that she typically eats at least 2 meals daily. Pt will occasionally have a snack between meals. A typical meal may include a chicken sandwich and a vegetable.  Pt denies any recent weight loss other than weight fluctuations related to fluid status. She reports her UBW as 150 lbs and states that at one point, she weighed 204 lbs due to fluid overload. Reviewed weight history in chart. Weight has fluctuated between 68-86 kg over the last year. Suspect fluctuations are related to volume status.  Pt would like to receive Glucerna supplements as she has had these in the past and enjoyed them. RD to order. Pt does not take a daily MVI at home but willing to consume one during admission.  Meal Completion:  75-100%  Medications reviewed and include: colace, IV lasix, SSI  Labs reviewed: creatinine 2.37 CBG's: 221-245 x 24 hours  UOP: 1050 ml x 24 hours  NUTRITION - FOCUSED PHYSICAL EXAM:  Flowsheet Row Most Recent Value  Orbital Region Mild depletion  Upper Arm Region No depletion  Thoracic and Lumbar Region No depletion  Buccal Region No depletion  Temple Region Mild depletion  Clavicle Bone Region Mild depletion  Clavicle and Acromion Bone Region Mild depletion  Scapular Bone Region No depletion  Dorsal Hand Mild depletion  Patellar Region No depletion  Anterior Thigh Region No depletion  Posterior Calf Region No depletion  Edema (RD Assessment) Mild  [BLE]  Hair Reviewed  Eyes Reviewed  Mouth Reviewed  Skin Reviewed  Nails Reviewed       Diet Order:   Diet Order             Diet heart healthy/carb modified Room service appropriate? Yes; Fluid consistency: Thin; Fluid restriction: 1500 mL Fluid  Diet effective now                   EDUCATION NEEDS:   Education needs have been addressed  Skin:  Skin Assessment: Reviewed RN Assessment  Last BM:  05/19/22  Height:   Ht Readings from Last 1 Encounters:  05/19/22 '5\' 6"'$  (1.676 m)    Weight:   Wt Readings from Last 1 Encounters:  05/19/22 68 kg    BMI:  Body mass index is 24.21 kg/m.  Estimated Nutritional Needs:   Kcal:  1700-1900  Protein:  85-100 grams  Fluid:  1.8 L    Gustavus Bryant, MS, RD, LDN Inpatient Clinical Dietitian Please see AMiON for contact information.

## 2022-05-20 NOTE — Hospital Course (Addendum)
Tracey Bryant is a 71 y.o. female with PMH cholecystitis with cholecystostomy tube in place (initially placed 03/19/2021), afib, HTN, DMII, sCHF, CVA who presented with shortness of breath.  She also had reported increased lower extremity swelling/overload with no improvement on her home Lasix. She underwent thoracentesis on 05/19/2022 removing 1.3 L.  Initially Lasix was held after developing some dizziness. Respiratory status improved after thoracentesis.    She also was evaluated by IR due to difficulty with cholecystostomy tube drainage.  Tube was evaluated and noted intact however suture had been removed but tube had not been retracted any.  After forceful flushing, biliary output was achieved with no further issues.

## 2022-05-20 NOTE — Progress Notes (Incomplete)
Heart Failure Stewardship Pharmacist Progress Note   PCP: Coolidge Breeze, FNP PCP-Cardiologist: Minus Breeding, MD    HPI:  71 yo F with PMH significant for sinus node dysfunction s/p Medtronic PPM implant 04/2020, PAF and DVT in 2022 on Eliquis, HTN, CVA, DM2, CKD IIIb and chronic systolic and diastolic CHF.  Patient presented to her Cardiology office 05/24 thinking she had an appointment and was referred to [facility] ED with SOB, bilateral LEE, and reduced activity tolerance.  Patient endorsed progressive symptoms despite recent increase Lasix 40 mg daily > BID at cardiology visit 05/09 where she reported worsening LEE, SOB, orthopnea, and PND.   02/2021 Echo showed reduced LVEF of 25-30% with mild LVH, G2DD, RV poorly visualized, and mild pulmonary HTN (PAP 36).   03/2021 L age indeterminate DVT  12/25/21 - 12/27/21 admission for CHF exacerbation - diuresed with Lasix IV 40 mg BID with total of -4.58 L volume removed.  Echo in December 2022 showed improvement in LVEF 40-45% with global hypokinesis, normal RV, and moderately elevated pulmonary pressures.  Discharge weight was 162 lbs.  Discharged on carvedilol 6.25 mg BID and furosemide 40 mg daily and was instructed to stop amlodipine 10 mg daily and BiDIl 20-37.5 mg TID.   ?PPM placed  Notably taking amlodipine 10 mg daily  Current HF Medications: Diuretic: furosemide IV 60 mg BID Beta Blocker: carvedilol 6.25 mg BID Other: hydralazine 25 mg q8h + Imdur 15 mg daily  Prior to admission HF Medications: Diuretic: furosemide 40 mg BID Beta blocker: carvedilol 6.25 mg BID Other: hydralazine 25 mg BID + Imdur 15 mg daily ; potassium 20 mEq BID  Pertinent Lab Values: Serum creatinine 2.13 > 2.37, BUN 19, Potassium 4.1, Sodium 137, BNP 2083.9, Magnesium ***, A1c 7.7%  Vital Signs: Weight: *** lbs (admission weight: 150 lbs) Blood pressure: ***  Heart rate: ***  I/O: -1L yesterday; net -330 mL   Medication Assistance / Insurance Benefits  Check: Does the patient have prescription insurance?  Yes Type of insurance plan: [name] [employer]  Does the patient qualify for medication assistance through manufacturers or grants?   Yes Eligible grants and/or patient assistance programs: pending Medication assistance applications in progress: pending  Medication assistance applications approved: pending Outpatient Pharmacy:  Prior to admission outpatient pharmacy: [facility] Is the patient willing to use [place] at discharge? Pending Is the patient willing to transition their outpatient pharmacy to utilize a [facility] outpatient pharmacy?   Pending    Assessment: 1. Acute on ***chronic ***systolic CHF (LVEF ***%), due to ***. NYHA class *** symptoms.  - No SGLT2i yet with h/o UTI during March-April 2022 admission   Plan: 1) Medication changes recommended at this time: -  2) Patient assistance: [name] = $47  3)  Education  - To be completed prior to discharge  Laurey Arrow, PharmD PGY1 Pharmacy Resident 05/20/2022  8:58 AM

## 2022-05-20 NOTE — Evaluation (Signed)
Physical Therapy Evaluation Patient Details Name: Tracey Bryant MRN: [id number] DOB: May 07, 1951 Today's Date: 05/20/2022  History of Present Illness  The pt is a 71 yo female presenting from home 5/24 with SOB and BLE swelling. Found to have acute CHF exacerbation and R pleural effusion s/p thoracentesis 5/24. PMH includes: arthritis, CHF, DM II, DVT, HTN, afib, and stroke.   Clinical Impression  Pt in bed upon arrival of PT, agreeable to evaluation at this time. Prior to admission the pt was mobilizing with use of cane or walker in the home and for distances outside of her home. She does report that for last few weeks with increased fluid build up, she has been more dependent on rolling office chair to mobilize in the home. The pt now presents with limitations in functional mobility, strength, power, muscular and cardiorespiratory endurance, and dynamic stability due to above dx, and will continue to benefit from skilled PT to address these deficits. The pt was able to progress walking distance to ~20 ft with minA and use of RW, but began having increased frequency of bilateral knee buckling due to LE fatigue needing increased assist and seated rest. The pt demos poor safety awareness during this situation, asking to continue walking despite increased assist to maintain upright. Will continue to benefit from skilled PT acutely to progress endurance and strength, will need increased assist from family and HHPT to continue working on LE strength and endurance after d/c.        Recommendations for follow up therapy are one component of a multi-disciplinary discharge planning process, led by the attending physician.  Recommendations may be updated based on patient status, additional functional criteria and insurance authorization.  Follow Up Recommendations Home health PT    Assistance Recommended at Discharge Frequent or constant Supervision/Assistance  Patient can return home with the following   A little help with walking and/or transfers;A little help with bathing/dressing/bathroom;Assistance with cooking/housework;Assistance with feeding;Direct supervision/assist for medications management;Direct supervision/assist for financial management;Assist for transportation;Help with stairs or ramp for entrance    Equipment Recommendations None recommended by PT  Recommendations for Other Services       Functional Status Assessment Patient has had a recent decline in their functional status and demonstrates the ability to make significant improvements in function in a reasonable and predictable amount of time.     Precautions / Restrictions Precautions Precautions: Fall Precaution Comments: poor endurance and awareness of deficits Restrictions Weight Bearing Restrictions: No      Mobility  Bed Mobility Overal bed mobility: Needs Assistance             General bed mobility comments: pt OOB in recliner at start and end of session    Transfers Overall transfer level: Needs assistance Equipment used: Rolling walker (2 wheels) Transfers: Sit to/from Stand Sit to Stand: Min guard, Min assist           General transfer comment: minG to rise from recliner with BUE support, minA to rise from rolling chair. pt dependent on BUE support,    Ambulation/Gait Ambulation/Gait assistance: Min guard, Min assist Gait Distance (Feet): 20 Feet Assistive device: Rolling walker (2 wheels) Gait Pattern/deviations: Step-to pattern, Decreased stride length, Knees buckling Gait velocity: decreased Gait velocity interpretation: <1.31 ft/sec, indicative of household ambulator   General Gait Details: pt with progressive fatigue resulting in minA and increased bilateral knee buckling. pulled chair to pt despite pt request to continue walking as she was requiring increased assist  Pertinent Vitals/Pain Pain Assessment Pain Assessment: Faces Faces Pain Scale: Hurts even more Pain  Location: bilateral knees with flexion Pain Descriptors / Indicators: Aching, Discomfort Pain Intervention(s): Monitored during session, Limited activity within patient's tolerance, Repositioned    Home Living Family/patient expects to be discharged to:: Private residence Living Arrangements: Alone Available Help at Discharge: Family;Available 24 hours/day Type of Home: House Home Access: Stairs to enter Entrance Stairs-Rails: Can reach both;Right;Left Entrance Stairs-Number of Steps: 3   Home Layout: One level Home Equipment: Rollator (4 wheels);Shower seat;BSC/3in1;Grab bars - Statistician (2 wheels);Cane - single point Additional Comments: Patient daughter lives next door and she and her teenage children assist PRN    Prior Function Prior Level of Function : Independent/Modified Independent             Mobility Comments: household ambulator using RW or cane, furniture, uses rollator for short community distances/to get mail etc. pt reports son hurt himself assisting her with car transfer reccently. ADLs Comments: assistance from family as needed, Does own laundry and cooking. Uses delivery service for groceries/shopping     Hand Dominance   Dominant Hand: Right    Extremity/Trunk Assessment   Upper Extremity Assessment Upper Extremity Assessment: Generalized weakness;Overall [facility] for tasks assessed    Lower Extremity Assessment Lower Extremity Assessment: Defer to PT evaluation    Cervical / Trunk Assessment Cervical / Trunk Assessment: Kyphotic;Other exceptions Cervical / Trunk Exceptions: large body habitus  Communication   Communication: No difficulties  Cognition Arousal/Alertness: Awake/alert Behavior During Therapy: WFL for tasks assessed/performed Overall Cognitive Status: Impaired/Different from baseline Area of Impairment: Memory, Safety/judgement, Awareness, Problem solving                     Memory: Decreased short-term memory    Safety/Judgement: Decreased awareness of safety, Decreased awareness of deficits Awareness: Intellectual Problem Solving: Decreased initiation, Difficulty sequencing, Requires verbal cues General Comments: pt with poor short term memory, sharing the same information multiple times through the session, pt with knees buckling and stating she is anxious and fatigued, pt states she usually tries to push through being tired even if it is unsafe        General Comments General comments (skin integrity, edema, etc.): VSS on RA        Assessment/Plan    PT Assessment Patient needs continued PT services  PT Problem List Decreased strength;Decreased range of motion;Decreased activity tolerance;Decreased balance;Decreased mobility;Decreased cognition       PT Treatment Interventions DME instruction;Gait training;Stair training;Functional mobility training;Therapeutic activities;Therapeutic exercise;Balance training;Patient/family education    PT Goals (Current goals can be found in the Care Plan section)  Acute Rehab PT Goals Patient Stated Goal: return to greater independence PT Goal Formulation: With patient Time For Goal Achievement: 06/03/22 Potential to Achieve Goals: Good    Frequency Min 3X/week        AM-PAC PT "6 Clicks" Mobility  Outcome Measure Help needed turning from your back to your side while in a flat bed without using bedrails?: A Little Help needed moving from lying on your back to sitting on the side of a flat bed without using bedrails?: A Little Help needed moving to and from a bed to a chair (including a wheelchair)?: A Little Help needed standing up from a chair using your arms (e.g., wheelchair or bedside chair)?: A Lot Help needed to walk in hospital room?: A Little Help needed climbing 3-5 steps with a railing? : A Lot 6 Click Score: 16  End of Session Equipment Utilized During Treatment: Gait belt Activity Tolerance: Patient tolerated treatment  well;Patient limited by fatigue Patient left: in chair;with call bell/phone within reach;with chair alarm set Nurse Communication: Mobility status PT Visit Diagnosis: Other abnormalities of gait and mobility (R26.89);Muscle weakness (generalized) (M62.81)    Time: 1610-9604 PT Time Calculation (min) (ACUTE ONLY): 29 min   Charges:   PT Evaluation $PT Eval Low Complexity: 1 Low PT Treatments $Therapeutic Exercise: 8-22 mins        West Carbo, PT, DPT   Acute Rehabilitation Department Pager #: [phone]  Sandra Cockayne 05/20/2022, 10:54 AM

## 2022-05-20 NOTE — Progress Notes (Signed)
Mobility Specialist Progress Note:   05/20/22 1530  Mobility  Activity Stood at bedside;Transferred from chair to bed  Level of Assistance +2 (takes two people)  Assistive Device Stedy  Activity Response RN notified  $Mobility charge 1 Mobility   Pt received in chair needing orthostatic VS. Upon standing pt went limp and was assisted back to chair and she came back to. We then got the stedy to assist the pt back to bed. MinA to stand in stedy and pt starting feeling "woozy and weak" causing her to gaze but was still able to follow all commands. Pt then needing to stand in stedy which caused her to pass out again. Pt stating she hears [place] but during these spells everything looks foggy and she cant control her body. Left in bed with call bell in reach and all needs met.   [facility] [place] Floor Mobility Specialist

## 2022-05-21 DIAGNOSIS — I5023 Acute on chronic systolic (congestive) heart failure: Secondary | ICD-10-CM | POA: Diagnosis not present

## 2022-05-21 DIAGNOSIS — I5033 Acute on chronic diastolic (congestive) heart failure: Secondary | ICD-10-CM | POA: Diagnosis not present

## 2022-05-21 DIAGNOSIS — N184 Chronic kidney disease, stage 4 (severe): Secondary | ICD-10-CM | POA: Diagnosis not present

## 2022-05-21 DIAGNOSIS — I509 Heart failure, unspecified: Secondary | ICD-10-CM | POA: Diagnosis not present

## 2022-05-21 DIAGNOSIS — R600 Localized edema: Secondary | ICD-10-CM | POA: Diagnosis not present

## 2022-05-21 LAB — GLUCOSE, CAPILLARY
Glucose-Capillary: 263 mg/dL — ABNORMAL HIGH (ref 70–99)
Glucose-Capillary: 222 mg/dL — ABNORMAL HIGH (ref 70–99)
Glucose-Capillary: 260 mg/dL — ABNORMAL HIGH (ref 70–99)
Glucose-Capillary: 220 mg/dL — ABNORMAL HIGH (ref 70–99)

## 2022-05-21 LAB — CBC WITH DIFFERENTIAL/PLATELET
Abs Immature Granulocytes: 0.03 10*3/uL (ref 0.00–0.07)
Basophils Absolute: 0 10*3/uL (ref 0.0–0.1)
Basophils Relative: 1 %
Eosinophils Absolute: 0.4 10*3/uL (ref 0.0–0.5)
Eosinophils Relative: 7 %
HCT: 32.8 % — ABNORMAL LOW (ref 36.0–46.0)
Hemoglobin: 10.4 g/dL — ABNORMAL LOW (ref 12.0–15.0)
Immature Granulocytes: 1 %
Lymphocytes Relative: 18 %
Lymphs Abs: 1.1 10*3/uL (ref 0.7–4.0)
MCH: 28.6 pg (ref 26.0–34.0)
MCHC: 31.7 g/dL (ref 30.0–36.0)
MCV: 90.1 fL (ref 80.0–100.0)
Monocytes Absolute: 0.4 10*3/uL (ref 0.1–1.0)
Monocytes Relative: 7 %
Neutro Abs: 4.2 10*3/uL (ref 1.7–7.7)
Neutrophils Relative %: 66 %
Platelets: 293 10*3/uL (ref 150–400)
RBC: 3.64 MIL/uL — ABNORMAL LOW (ref 3.87–5.11)
RDW: 15.3 % (ref 11.5–15.5)
WBC: 6.3 10*3/uL (ref 4.0–10.5)
nRBC: 0 % (ref 0.0–0.2)

## 2022-05-21 LAB — BASIC METABOLIC PANEL
Anion gap: 9 (ref 5–15)
BUN: 21 mg/dL (ref 8–23)
CO2: 24 mmol/L (ref 22–32)
Calcium: 8 mg/dL — ABNORMAL LOW (ref 8.9–10.3)
Chloride: 103 mmol/L (ref 98–111)
Creatinine, Ser: 2.7 mg/dL — ABNORMAL HIGH (ref 0.44–1.00)
GFR, Estimated: 18 mL/min — ABNORMAL LOW (ref >60)
Glucose, Bld: 303 mg/dL — ABNORMAL HIGH (ref 70–99)
Potassium: 4.3 mmol/L (ref 3.5–5.1)
Sodium: 136 mmol/L (ref 135–145)

## 2022-05-21 LAB — HEPATIC FUNCTION PANEL
ALT: 9 U/L (ref 0–44)
AST: 11 U/L — ABNORMAL LOW (ref 15–41)
Albumin: 1.9 g/dL — ABNORMAL LOW (ref 3.5–5.0)
Alkaline Phosphatase: 56 U/L (ref 38–126)
Bilirubin, Direct: <0.1 mg/dL (ref 0.0–0.2)
Total Bilirubin: 0.5 mg/dL (ref 0.3–1.2)
Total Protein: 5.3 g/dL — ABNORMAL LOW (ref 6.5–8.1)

## 2022-05-21 LAB — HEMOGLOBIN A1C
Hgb A1c MFr Bld: 9.4 % — ABNORMAL HIGH (ref 4.8–5.6)
Mean Plasma Glucose: 223.08 mg/dL

## 2022-05-21 MED ORDER — INSULIN GLARGINE-YFGN 100 UNIT/ML ~~LOC~~ SOLN
12.0000 [IU] | Freq: Every day | SUBCUTANEOUS | Status: DC
  Administered 2022-05-22 – 2022-05-28 (×7): 12 [IU] via SUBCUTANEOUS
  Filled 2022-05-21 (×7): qty 0.12

## 2022-05-21 MED ORDER — HYDRALAZINE HCL 50 MG PO TABS
50.0000 mg | ORAL_TABLET | Freq: Three times a day (TID) | ORAL | Status: DC
  Administered 2022-05-21 – 2022-06-09 (×58): 50 mg via ORAL
  Filled 2022-05-21 (×61): qty 1

## 2022-05-21 MED ORDER — MAGNESIUM SULFATE 2 GM/50ML IV SOLN
2.0000 g | Freq: Once | INTRAVENOUS | Status: AC
  Administered 2022-05-21: 2 g via INTRAVENOUS
  Filled 2022-05-21: qty 50

## 2022-05-21 NOTE — Progress Notes (Signed)
Heart Failure Navigator Progress Note  Assessed for Heart & Vascular TOC clinic readiness.  Patient has a hospital follow up with General cardiology on 05/26/22 @ 9:15.    Earnestine Leys, BSN, Clinical cytogeneticist Only

## 2022-05-21 NOTE — Progress Notes (Signed)
Inpatient Diabetes Program Recommendations  AACE/ADA: New Consensus Statement on Inpatient Glycemic Control (2015)  Target Ranges:  Prepandial:   less than 140 mg/dL      Peak postprandial:   less than 180 mg/dL (1-2 hours)      Critically ill patients:  140 - 180 mg/dL   Lab Results  Component Value Date   GLUCAP 222 (H) 05/21/2022   HGBA1C 9.4 (H) 05/21/2022    Review of Glycemic Control  Latest Reference Range & Units 05/20/22 16:38 05/20/22 21:06 05/21/22 06:32 05/21/22 11:39  Glucose-Capillary 70 - 99 mg/dL 202 (H) 248 (H) 263 (H) 222 (H)  Diabetes history: DM 2 Outpatient Diabetes medications:  Glucotrol 5 mg daily (not taking) Current orders for Inpatient glycemic control:  Semglee 8 units daily, Novolog moderate tid with meals Inpatient Diabetes Program Recommendations:    Consider increasing Semglee to 12 units daily.  A1C is >goal.  May benefit from restart of basal insulin at d/c or possibly GLP-1 (such as Ozempic).   Thanks,  Adah Perl, RN, BC-ADM Inpatient Diabetes Coordinator Pager [phone]  (8a-5p)

## 2022-05-21 NOTE — Plan of Care (Signed)
  Problem: Clinical Measurements: Goal: Respiratory complications will improve Outcome: Progressing   Problem: Nutrition: Goal: Adequate nutrition will be maintained Outcome: Progressing   Problem: Coping: Goal: Level of anxiety will decrease Outcome: Progressing   Problem: Pain Managment: Goal: General experience of comfort will improve Outcome: Progressing   Problem: Safety: Goal: Ability to remain free from injury will improve Outcome: Progressing

## 2022-05-21 NOTE — Progress Notes (Signed)
Physical Therapy Treatment Patient Details Name: Tracey Bryant MRN: [id number] DOB: [birth year] Today's Date: 05/21/2022   History of Present Illness The pt is a 71 yo female presenting from home 5/24 with SOB and BLE swelling. Found to have acute CHF exacerbation and R pleural effusion s/p thoracentesis 5/24. PMH includes: arthritis, CHF, DM II, DVT, HTN, afib, and stroke.    PT Comments    The pt was seen for additional visit to assist with return to bed this afternoon. Pt remains anxious and fearful after events of yesterday, but was able to complete sit-stand transfers and static standing in stedy for ~8-10 min without change in BP or reports of dizziness. The pt continues to present with weakness and instability in core and postural muscles as well as limited strength, coordination, and power in BLE. The pt is highly motivated, and was able to complete standing marches in stedy after 8 min of static stance, but did need increased assist to maintain balance with wt shift and movement of each LE. The pt was able to manage knee buckling this afternoon, but does require cues and distraction to complete. Given the pt's prior level of independence and family assist available, continue to recommend acute inpatient rehab at d/c.     Recommendations for follow up therapy are one component of a multi-disciplinary discharge planning process, led by the attending physician.  Recommendations may be updated based on patient status, additional functional criteria and insurance authorization.  Follow Up Recommendations  Acute inpatient rehab (3hours/day)     Assistance Recommended at Discharge Frequent or constant Supervision/Assistance  Patient can return home with the following Two people to help with walking and/or transfers;Two people to help with bathing/dressing/bathroom;Assistance with cooking/housework;Direct supervision/assist for medications management;Direct supervision/assist for financial  management;Assist for transportation;Help with stairs or ramp for entrance   Equipment Recommendations  None recommended by PT    Recommendations for Other Services       Precautions / Restrictions Precautions Precautions: Fall Precaution Comments: poor endurance and awareness of deficits Restrictions Weight Bearing Restrictions: No     Mobility  Bed Mobility Overal bed mobility: Needs Assistance Bed Mobility: Sit to Supine       Sit to supine: Min assist   General bed mobility comments: minA to BLE to return them to bed    Transfers Overall transfer level: Needs assistance Equipment used: Ambulation equipment used Transfers: Sit to/from Stand, Bed to chair/wheelchair/BSC Sit to Stand: Min assist           General transfer comment: minA of 2 to rise in stedy, pt cued not to use RUE due to pain with shoulder flexion. Pt needing instruction to complete transfer, but once putting in effort, able to rise to standing with minA. mild instability with initial stand but no knee buckling at this time. VSS with stance Transfer via Lift Equipment: Stedy  Ambulation/Gait             Pre-gait activities: standing marches in stedy. up to modA to steady while pt wt-shifting, increased time to lift each foot with jerking movements in lifted leg. x5 each side General Gait Details: used stedy to transfer from chair to bed, then pre-gait activities from stedy      Balance Overall balance assessment: Needs assistance Sitting-balance support: Single extremity supported, No upper extremity supported, Feet supported Sitting balance-Leahy Scale: Fair Sitting balance - Comments: very limited, pt fatigues quickly and collapses back against recliner.   Standing balance support: Bilateral upper extremity  supported, Reliant on assistive device for balance Standing balance-Leahy Scale: Poor Standing balance comment: Reliant on RW for balance with up to [employer] Arousal/Alertness: Awake/alert Behavior During Therapy: WFL for tasks assessed/performed Overall Cognitive Status: Impaired/Different from baseline Area of Impairment: Memory, Safety/judgement, Awareness, Problem solving                     Memory: Decreased short-term memory   Safety/Judgement: Decreased awareness of safety, Decreased awareness of deficits Awareness: Intellectual Problem Solving: Decreased initiation, Difficulty sequencing, Requires verbal cues General Comments: pt motivated, but needing cues for maintaining LE straight when standing, max cues for safety, pt seems to perform more poorly when anxious, responds well to encouragement and distraction. able to follwo all cues for technique        Exercises Other Exercises Other Exercises: standing marches in stedy, x5 each leg with up to modA to steady Other Exercises: LAQ with min manual resistance x5 with LLE and x2 with RLE, limited by pain and fatigue. pt unable to achieve full ROM, very jerky movements, pt crying with pain/effort    General Comments General comments (skin integrity, edema, etc.): BP stable with all changes in position at this time      Pertinent Vitals/Pain Pain Assessment Pain Assessment: Faces Faces Pain Scale: Hurts even more Pain Location: R shoulder, bilateral knees (R knee > L knee) Pain Descriptors / Indicators: Aching, Discomfort, Moaning Pain Intervention(s): Monitored during session, Repositioned, Limited activity within patient's tolerance     PT Goals (current goals can now be found in the care plan section) Acute Rehab PT Goals Patient Stated Goal: return to greater independence PT Goal Formulation: With patient Time For Goal Achievement: 06/03/22 Potential to Achieve Goals: Good Progress towards PT goals: Progressing toward goals    Frequency    Min 3X/week      PT Plan Current plan remains appropriate       AM-PAC PT "6 Clicks"  Mobility   Outcome Measure  Help needed turning from your back to your side while in a flat bed without using bedrails?: A Little Help needed moving from lying on your back to sitting on the side of a flat bed without using bedrails?: A Little Help needed moving to and from a bed to a chair (including a wheelchair)?: A Lot Help needed standing up from a chair using your arms (e.g., wheelchair or bedside chair)?: A Lot Help needed to walk in hospital room?: Total Help needed climbing 3-5 steps with a railing? : Total 6 Click Score: 12    End of Session Equipment Utilized During Treatment: Gait belt Activity Tolerance: Patient limited by fatigue;Patient limited by pain Patient left: in bed;with call bell/phone within reach;with bed alarm set Nurse Communication: Mobility status PT Visit Diagnosis: Other abnormalities of gait and mobility (R26.89);Muscle weakness (generalized) (M62.81)     Time: 3716-9678 PT Time Calculation (min) (ACUTE ONLY): 19 min  Charges:  $Therapeutic Exercise: 8-22 mins                     West Carbo, PT, DPT   Acute Rehabilitation Department Pager #: [phone]   Sandra Cockayne 05/21/2022, 3:07 PM

## 2022-05-21 NOTE — Progress Notes (Addendum)
PROGRESS NOTE    Tracey Bryant  MVE:[id number] DOB: [birth year] DOA: 05/19/2022 PCP: Coolidge Breeze, FNP    Brief Narrative:  Tracey Bryant is a 71 y.o. female with medical history significant of PAF; HTN; DM; chronic systolic CHF; and CVA presenting with SOB. She reports that 2-3 weeks ago she was overloaded.  Her doctor increased her medications but it doesn't seem to be getting the fluid off.  She has SOB.  She is too SOB to even walk.  Her weight is up - was 150 -> 204.  S/p thoracentesis.      Assessment and Plan: Acute on chronic systolic CHF -known h/o chronic systolic CHF presenting with worsening SOB and edema -echocardiogram, last done in December (EF 40-45%) -Continue Coreg, no ACE due to renal dysfunction -Cardiology consult -IV lasix held on 5/25 PM as patient was having some dizziness -wean o2 (on RA) -daily weights- not accurate Strict I/os   R pleural effusion -Patient with prior pleural effusion requiring thoracentesis  -s/p thoracentesis- 1300 cc   Biliary drain pain -IR consulted -? 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 -slightly worse  Weakness PT eval   DVT prophylaxis:  apixaban (ELIQUIS) tablet 5 mg    Code Status: Full Code   Disposition Plan:  Level of care: Telemetry Cardiac Status is: inpt     Consultants:  Cards IR   Subjective: Unsure about her biliary drain-- when she was last seen, etc Legs VERY weak  Objective: Vitals:   05/21/22 0021 05/21/22 0414 05/21/22 0623 05/21/22 0741  BP: 138/67 140/61  (!) 142/68  Pulse: 68 60    Resp: '17 16 20 19  '$ Temp: 98.3 F (36.8 C) 98.1 F (36.7 C)  98.2 F (36.8 C)  TempSrc: Oral Oral  Oral  SpO2: 93% 95%  96%  Weight:   91.6 kg    Height:        Intake/Output Summary (Last 24 hours) at 05/21/2022 0932 Last data filed at 05/20/2022 2358 Gross per 24 hour  Intake 298 ml  Output 550 ml  Net -252 ml   Filed Weights   05/19/22 1021 05/21/22 0623  Weight: 68 kg 91.6 kg    Examination:  General: Appearance:    Obese female in no acute distress     Lungs:     respirations unlabored  Heart:    Normal heart rate.   MS:   All extremities are intact.   Neurologic:   Awake, alert, oriented x 3      Data Reviewed: I have personally reviewed following labs and imaging studies  CBC: Recent Labs  Lab 05/19/22 1040 05/20/22 0438 05/21/22 0256  WBC 8.5 6.9 6.3  NEUTROABS 5.7  --  4.2  HGB 12.4 11.1* 10.4*  HCT 39.8 35.2* 32.8*  MCV 92.1 91.2 90.1  PLT 371 298 283   Basic Metabolic Panel: Recent Labs  Lab 05/19/22 1040 05/20/22 0438 05/21/22 0256  NA 139 137 136  K 3.9 4.1 4.3  CL 108 107 103  CO2 '22 23 24  '$ GLUCOSE 236* 250* 303*  BUN '15 19 21  '$ CREATININE 2.13* 2.37* 2.70*  CALCIUM 8.9 8.3* 8.0*  MG  --  1.8  --  GFR: Estimated Creatinine Clearance: 22.1 mL/min (A) (by C-G formula based on SCr of 2.7 mg/dL (H)). Liver Function Tests: Recent Labs  Lab 05/21/22 0256  AST 11*  ALT 9  ALKPHOS 56  BILITOT 0.5  PROT 5.3*  ALBUMIN 1.9*   No results for input(s): LIPASE, AMYLASE in the last 168 hours. No results for input(s): AMMONIA in the last 168 hours. Coagulation Profile: No results for input(s): INR, PROTIME in the last 168 hours. Cardiac Enzymes: No results for input(s): CKTOTAL, CKMB, CKMBINDEX, TROPONINI in the last 168 hours. BNP (last 3 results) Recent Labs    05/04/22 1625  PROBNP 15,044*   HbA1C: Recent Labs    05/21/22 0256  HGBA1C 9.4*   CBG: Recent Labs  Lab 05/20/22 1101 05/20/22 1327 05/20/22 1638 05/20/22 2106 05/21/22 0632  GLUCAP 221* 281* 202* 248* 263*   Lipid Profile: No results for input(s): CHOL, HDL, LDLCALC, TRIG, CHOLHDL, LDLDIRECT in the  last 72 hours. Thyroid Function Tests: No results for input(s): TSH, T4TOTAL, FREET4, T3FREE, THYROIDAB in the last 72 hours. Anemia Panel: No results for input(s): VITAMINB12, FOLATE, FERRITIN, TIBC, IRON, RETICCTPCT in the last 72 hours. Sepsis Labs: No results for input(s): PROCALCITON, LATICACIDVEN in the last 168 hours.  No results found for this or any previous visit (from the past 240 hour(s)).       Radiology Studies: DG Chest 2 View  Result Date: 05/19/2022 CLINICAL DATA:  Shortness of breath. EXAM: CHEST - 2 VIEW COMPARISON:  12/25/2021 FINDINGS: Low volume film. Interval progression of right base collapse/consolidation with moderate right pleural effusion. Minimal atelectasis or infiltrate noted left base with probable tiny left pleural effusion. The cardio pericardial silhouette is enlarged. Dual lead permanent pacemaker evident. IMPRESSION: Interval progression of right base collapse/consolidation with moderate right pleural effusion. Electronically Signed   By: Misty Stanley M.D.   On: 05/19/2022 11:07   DG CHEST PORT 1 VIEW  Result Date: 05/19/2022 CLINICAL DATA:  Shortness of breath, respiratory distress EXAM: PORTABLE CHEST 1 VIEW COMPARISON:  05/19/2022 at 1123 hours FINDINGS: Small right pleural effusion, improved. Left lung is clear. No frank interstitial edema. Cardiomegaly. Thoracic aortic atherosclerosis. Left subclavian pacemaker. IMPRESSION: Small right pleural effusion, improved. No frank interstitial edema. Electronically Signed   By: Julian Hy M.D.   On: 05/19/2022 17:32   ECHOCARDIOGRAM COMPLETE  Result Date: 05/20/2022    ECHOCARDIOGRAM REPORT   Patient Name:   Tracey Bryant Date of Exam: 05/20/2022 Medical Rec #:  [id number]       Height:       66.0 in Accession #:    [id number]      Weight:       150.0 lb Date of Birth:  06-22-1951       BSA:          1.770 m Patient Age:    62 years        BP:           126/67 mmHg Patient Gender: F               HR:            63 bpm. Exam Location:  Inpatient Procedure: 2D Echo, Cardiac Doppler and Color Doppler Indications:    CHF-Acute Systolic 269.48 / N46.27  History:        Patient has prior history of Echocardiogram examinations, most                 recent  12/26/2021. CHF, Stroke, Arrythmias:Atrial Fibrillation;                 Risk Factors:Hypertension and Diabetes.  Sonographer:    Bernadene Person RDCS Referring Phys: [place]  1. Left ventricular ejection fraction, by estimation, is 50 to 55%. The left ventricle has low normal function. The left ventricle has no regional wall motion abnormalities. Left ventricular diastolic parameters are consistent with Grade II diastolic dysfunction (pseudonormalization). Elevated left ventricular end-diastolic pressure. The E/e' is 21.  2. Right ventricular systolic function is normal. The right ventricular size is mildly enlarged. There is mildly elevated pulmonary artery systolic pressure. The estimated right ventricular systolic pressure is 06.3 mmHg.  3. Left atrial size was mildly dilated.  4. The mitral valve is abnormal. Trivial mitral valve regurgitation.  5. The aortic valve is tricuspid. Aortic valve regurgitation is not visualized.  6. The inferior vena cava is normal in size with <50% respiratory variability, suggesting right atrial pressure of 8 mmHg. Comparison(s): Changes from prior study are noted. 12/26/2021: LVEF 40-45%, RVSP 45.8 mmHg. FINDINGS  Left Ventricle: Left ventricular ejection fraction, by estimation, is 50 to 55%. The left ventricle has low normal function. The left ventricle has no regional wall motion abnormalities. The left ventricular internal cavity size was normal in size. There is no left ventricular hypertrophy. Left ventricular diastolic parameters are consistent with Grade II diastolic dysfunction (pseudonormalization). Elevated left ventricular end-diastolic pressure. The E/e' is 21. Right Ventricle: The right  ventricular size is mildly enlarged. No increase in right ventricular wall thickness. Right ventricular systolic function is normal. There is mildly elevated pulmonary artery systolic pressure. The tricuspid regurgitant velocity is 2.75 m/s, and with an assumed right atrial pressure of 8 mmHg, the estimated right ventricular systolic pressure is 01.6 mmHg. Left Atrium: Left atrial size was mildly dilated. Right Atrium: Right atrial size was normal in size. Pericardium: There is no evidence of pericardial effusion. Mitral Valve: The mitral valve is abnormal. There is mild calcification of the posterior mitral valve leaflet(s). Trivial mitral valve regurgitation. Tricuspid Valve: The tricuspid valve is grossly normal. Tricuspid valve regurgitation is trivial. Aortic Valve: The aortic valve is tricuspid. Aortic valve regurgitation is not visualized. Pulmonic Valve: The pulmonic valve was normal in structure. Pulmonic valve regurgitation is not visualized. Aorta: The aortic root and ascending aorta are structurally normal, with no evidence of dilitation. Venous: The inferior vena cava is normal in size with less than 50% respiratory variability, suggesting right atrial pressure of 8 mmHg. IAS/Shunts: No atrial level shunt detected by color flow Doppler.  LEFT VENTRICLE PLAX 2D LVIDd:         4.50 cm     Diastology LVIDs:         3.20 cm     LV e' medial:    5.01 cm/s LV PW:         1.00 cm     LV E/e' medial:  21.2 LV IVS:        1.10 cm     LV e' lateral:   5.06 cm/s LVOT diam:     2.00 cm     LV E/e' lateral: 20.9 LV SV:         57 LV SV Index:   32 LVOT Area:     3.14 cm  LV Volumes (MOD) LV vol d, MOD A2C: 94.8 ml LV vol d, MOD A4C: 99.9 ml LV vol s, MOD A2C: 42.9 ml LV vol s,  MOD A4C: 43.3 ml LV SV MOD A2C:     51.9 ml LV SV MOD A4C:     99.9 ml LV SV MOD BP:      55.3 ml RIGHT VENTRICLE RV S prime:     11.80 cm/s TAPSE (M-mode): 1.3 cm LEFT ATRIUM             Index        RIGHT ATRIUM           Index LA diam:         4.00 cm 2.26 cm/m   RA Area:     16.70 cm LA Vol (A2C):   62.2 ml 35.15 ml/m  RA Volume:   41.00 ml  23.17 ml/m LA Vol (A4C):   54.6 ml 30.85 ml/m LA Biplane Vol: 61.9 ml 34.98 ml/m  AORTIC VALVE LVOT Vmax:   85.40 cm/s LVOT Vmean:  55.400 cm/s LVOT VTI:    0.181 m  AORTA Ao Root diam: 3.10 cm Ao Asc diam:  3.20 cm MITRAL VALVE                TRICUSPID VALVE MV Area (PHT): 2.99 cm     TR Peak grad:   30.2 mmHg MV Decel Time: 254 msec     TR Vmax:        275.00 cm/s MV E velocity: 106.00 cm/s MV A velocity: 36.20 cm/s   SHUNTS MV E/A ratio:  2.93         Systemic VTI:  0.18 m                             Systemic Diam: 2.00 cm Lyman Bishop MD Electronically signed by Lyman Bishop MD Signature Date/Time: 05/20/2022/2:25:44 PM    Final         Scheduled Meds:  apixaban  5 mg Oral BID   carvedilol  6.25 mg Oral BID WC   docusate sodium  100 mg Oral BID   feeding supplement (GLUCERNA SHAKE)  237 mL Oral TID BM   hydrALAZINE  25 mg Oral Q8H   insulin aspart  0-15 Units Subcutaneous TID WC   insulin aspart  0-5 Units Subcutaneous QHS   insulin glargine-yfgn  8 Units Subcutaneous Daily   isosorbide mononitrate  15 mg Oral Daily   multivitamin with minerals  1 tablet Oral Daily   sodium chloride flush  3 mL Intravenous Q12H   Continuous Infusions:   LOS: 1 day    Time spent: 35 minutes spent on chart review, discussion with nursing staff, consultants, updating family and interview/physical exam; more than 50% of that time was spent in counseling and/or coordination of care.    Geradine Girt, DO [facility] Available via Epic secure chat 7am-7pm After these hours, please refer to coverage provider listed on [email or web address] 05/21/2022, 9:32 AM

## 2022-05-21 NOTE — Progress Notes (Signed)
Progress Note  Patient Name: Tracey Bryant Date of Encounter: 05/21/2022  [facility] Cardiologist: Minus Breeding, MD   Subjective   Understands that Lasix had to be stopped.  Overall breathing better than when she first came into the hospital.  Biggest improvement was after thoracentesis.  Inpatient Medications    Scheduled Meds:  apixaban  5 mg Oral BID   carvedilol  6.25 mg Oral BID WC   docusate sodium  100 mg Oral BID   feeding supplement (GLUCERNA SHAKE)  237 mL Oral TID BM   hydrALAZINE  25 mg Oral Q8H   insulin aspart  0-15 Units Subcutaneous TID WC   insulin aspart  0-5 Units Subcutaneous QHS   insulin glargine-yfgn  8 Units Subcutaneous Daily   isosorbide mononitrate  15 mg Oral Daily   multivitamin with minerals  1 tablet Oral Daily   sodium chloride flush  3 mL Intravenous Q12H   Continuous Infusions:  PRN Meds: acetaminophen **OR** acetaminophen, bisacodyl, hydrALAZINE, morphine injection, ondansetron **OR** ondansetron (ZOFRAN) IV, oxyCODONE, polyethylene glycol, traZODone   Vital Signs    Vitals:   05/21/22 0414 05/21/22 0623 05/21/22 0741 05/21/22 1023  BP: 140/61  (!) 142/68 (!) 162/82  Pulse: 60     Resp: '16 20 19   '$ Temp: 98.1 F (36.7 C)  98.2 F (36.8 C)   TempSrc: Oral  Oral   SpO2: 95%  96%   Weight:  91.6 kg    Height:        Intake/Output Summary (Last 24 hours) at 05/21/2022 1118 Last data filed at 05/20/2022 2358 Gross per 24 hour  Intake 298 ml  Output 550 ml  Net -252 ml      05/21/2022    6:23 AM 05/19/2022   10:21 AM 01/08/2022    7:20 AM  Last 3 Weights  Weight (lbs) 201 lb 15.1 oz 150 lb 150 lb  Weight (kg) 91.6 kg 68.04 kg 68.04 kg      Telemetry    AV pacing- Personally Reviewed  ECG      Physical Exam   GEN: No acute distress.  Frail.  Mild shortness of breath with talking.  Able to lie flat without wearing oxygen Neck: No JVD Cardiac: RRR, no murmurs, rubs, or gallops.  Respiratory: Clear to  auscultation bilaterally. GI: Soft, nontender, non-distended, obese MS: 1+ bilateral pitting lower extremity edema; No deformity. Neuro:  Nonfocal  Psych: Normal affect   Labs    High Sensitivity Troponin:  No results for input(s): TROPONINIHS in the last 720 hours.   Chemistry Recent Labs  Lab 05/19/22 1040 05/20/22 0438 05/21/22 0256  NA 139 137 136  K 3.9 4.1 4.3  CL 108 107 103  CO2 '22 23 24  '$ GLUCOSE 236* 250* 303*  BUN '15 19 21  '$ CREATININE 2.13* 2.37* 2.70*  CALCIUM 8.9 8.3* 8.0*  MG  --  1.8  --   PROT  --   --  5.3*  ALBUMIN  --   --  1.9*  AST  --   --  11*  ALT  --   --  9  ALKPHOS  --   --  56  BILITOT  --   --  0.5  GFRNONAA 24* 22* 18*  ANIONGAP '9 7 9    '$ Lipids No results for input(s): CHOL, TRIG, HDL, LABVLDL, LDLCALC, CHOLHDL in the last 168 hours.  Hematology Recent Labs  Lab 05/19/22 1040 05/20/22 0438 05/21/22 0256  WBC 8.5  6.9 6.3  RBC 4.32 3.86* 3.64*  HGB 12.4 11.1* 10.4*  HCT 39.8 35.2* 32.8*  MCV 92.1 91.2 90.1  MCH 28.7 28.8 28.6  MCHC 31.2 31.5 31.7  RDW 15.0 15.5 15.3  PLT 371 298 293   Thyroid No results for input(s): TSH, FREET4 in the last 168 hours.  BNP Recent Labs  Lab 05/19/22 1040  BNP 2,083.9*    DDimer No results for input(s): DDIMER in the last 168 hours.   Radiology    DG CHEST PORT 1 VIEW  Result Date: 05/19/2022 CLINICAL DATA:  Shortness of breath, respiratory distress EXAM: PORTABLE CHEST 1 VIEW COMPARISON:  05/19/2022 at 1123 hours FINDINGS: Small right pleural effusion, improved. Left lung is clear. No frank interstitial edema. Cardiomegaly. Thoracic aortic atherosclerosis. Left subclavian pacemaker. IMPRESSION: Small right pleural effusion, improved. No frank interstitial edema. Electronically Signed   By: Julian Hy M.D.   On: 05/19/2022 17:32   ECHOCARDIOGRAM COMPLETE  Result Date: 05/20/2022    ECHOCARDIOGRAM REPORT   Patient Name:   Tracey Bryant Date of Exam: 05/20/2022 Medical Rec #:  [id number]        Height:       66.0 in Accession #:    [id number]      Weight:       150.0 lb Date of Birth:  [birth year]       BSA:          1.770 m Patient Age:    71 years        BP:           126/67 mmHg Patient Gender: F               HR:           63 bpm. Exam Location:  Inpatient Procedure: 2D Echo, Cardiac Doppler and Color Doppler Indications:    CHF-Acute Systolic 944.96 / P59.16  History:        Patient has prior history of Echocardiogram examinations, most                 recent 12/26/2021. CHF, Stroke, Arrythmias:Atrial Fibrillation;                 Risk Factors:Hypertension and Diabetes.  Sonographer:    Bernadene Person RDCS Referring Phys: [place]  1. Left ventricular ejection fraction, by estimation, is 50 to 55%. The left ventricle has low normal function. The left ventricle has no regional wall motion abnormalities. Left ventricular diastolic parameters are consistent with Grade II diastolic dysfunction (pseudonormalization). Elevated left ventricular end-diastolic pressure. The E/e' is 21.  2. Right ventricular systolic function is normal. The right ventricular size is mildly enlarged. There is mildly elevated pulmonary artery systolic pressure. The estimated right ventricular systolic pressure is 38.4 mmHg.  3. Left atrial size was mildly dilated.  4. The mitral valve is abnormal. Trivial mitral valve regurgitation.  5. The aortic valve is tricuspid. Aortic valve regurgitation is not visualized.  6. The inferior vena cava is normal in size with <50% respiratory variability, suggesting right atrial pressure of 8 mmHg. Comparison(s): Changes from prior study are noted. 12/26/2021: LVEF 40-45%, RVSP 45.8 mmHg. FINDINGS  Left Ventricle: Left ventricular ejection fraction, by estimation, is 50 to 55%. The left ventricle has low normal function. The left ventricle has no regional wall motion abnormalities. The left ventricular internal cavity size was normal in size. There is no left  ventricular hypertrophy. Left ventricular diastolic parameters are  consistent with Grade II diastolic dysfunction (pseudonormalization). Elevated left ventricular end-diastolic pressure. The E/e' is 21. Right Ventricle: The right ventricular size is mildly enlarged. No increase in right ventricular wall thickness. Right ventricular systolic function is normal. There is mildly elevated pulmonary artery systolic pressure. The tricuspid regurgitant velocity is 2.75 m/s, and with an assumed right atrial pressure of 8 mmHg, the estimated right ventricular systolic pressure is 67.1 mmHg. Left Atrium: Left atrial size was mildly dilated. Right Atrium: Right atrial size was normal in size. Pericardium: There is no evidence of pericardial effusion. Mitral Valve: The mitral valve is abnormal. There is mild calcification of the posterior mitral valve leaflet(s). Trivial mitral valve regurgitation. Tricuspid Valve: The tricuspid valve is grossly normal. Tricuspid valve regurgitation is trivial. Aortic Valve: The aortic valve is tricuspid. Aortic valve regurgitation is not visualized. Pulmonic Valve: The pulmonic valve was normal in structure. Pulmonic valve regurgitation is not visualized. Aorta: The aortic root and ascending aorta are structurally normal, with no evidence of dilitation. Venous: The inferior vena cava is normal in size with less than 50% respiratory variability, suggesting right atrial pressure of 8 mmHg. IAS/Shunts: No atrial level shunt detected by color flow Doppler.  LEFT VENTRICLE PLAX 2D LVIDd:         4.50 cm     Diastology LVIDs:         3.20 cm     LV e' medial:    5.01 cm/s LV PW:         1.00 cm     LV E/e' medial:  21.2 LV IVS:        1.10 cm     LV e' lateral:   5.06 cm/s LVOT diam:     2.00 cm     LV E/e' lateral: 20.9 LV SV:         57 LV SV Index:   32 LVOT Area:     3.14 cm  LV Volumes (MOD) LV vol d, MOD A2C: 94.8 ml LV vol d, MOD A4C: 99.9 ml LV vol s, MOD A2C: 42.9 ml LV vol s, MOD A4C:  43.3 ml LV SV MOD A2C:     51.9 ml LV SV MOD A4C:     99.9 ml LV SV MOD BP:      55.3 ml RIGHT VENTRICLE RV S prime:     11.80 cm/s TAPSE (M-mode): 1.3 cm LEFT ATRIUM             Index        RIGHT ATRIUM           Index LA diam:        4.00 cm 2.26 cm/m   RA Area:     16.70 cm LA Vol (A2C):   62.2 ml 35.15 ml/m  RA Volume:   41.00 ml  23.17 ml/m LA Vol (A4C):   54.6 ml 30.85 ml/m LA Biplane Vol: 61.9 ml 34.98 ml/m  AORTIC VALVE LVOT Vmax:   85.40 cm/s LVOT Vmean:  55.400 cm/s LVOT VTI:    0.181 m  AORTA Ao Root diam: 3.10 cm Ao Asc diam:  3.20 cm MITRAL VALVE                TRICUSPID VALVE MV Area (PHT): 2.99 cm     TR Peak grad:   30.2 mmHg MV Decel Time: 254 msec     TR Vmax:        275.00 cm/s MV E velocity: 106.00 cm/s  MV A velocity: 36.20 cm/s   SHUNTS MV E/A ratio:  2.93         Systemic VTI:  0.18 m                             Systemic Diam: 2.00 cm Lyman Bishop MD Electronically signed by Lyman Bishop MD Signature Date/Time: 05/20/2022/2:25:44 PM    Final     Cardiac Studies   EF 50-55  Patient Profile     71 y.o. female with CHF, status post diuresis and thoracentesis  Assessment & Plan    Acute on chronic diastolic heart failure: Status postthoracentesis.  Now with acute on chronic renal insufficiency.  Holding Lasix at this time.  Resume diuresis when renal function allows.  Per chart review, issues with her gallbladder drain.  Interventional radiology to address.  Eliquis for stroke prevention in the setting of atrial fibrillation.  Currently paced rhythm     For questions or updates, please contact [place] Please consult [email or web address] for contact info under        Signed, Larae Grooms, MD  05/21/2022, 11:18 AM

## 2022-05-21 NOTE — Progress Notes (Signed)
Physical Therapy Treatment Patient Details Name: Tracey Bryant MRN: [id number] DOB: [birth year] Today's Date: 05/21/2022   History of Present Illness The pt is a 71 yo female presenting from home 5/24 with SOB and BLE swelling. Found to have acute CHF exacerbation and R pleural effusion s/p thoracentesis 5/24. PMH includes: arthritis, CHF, DM II, DVT, HTN, afib, and stroke.    PT Comments    The pt was agreeable to session with focus on LE strengthening and OOB mobility, reports she fainted multiple times with OOB mobility yesterday with mobility specialist and NT. The pt also reports increased pain in R shoulder and bilateral knees, continues to need significant assist to manage even static sitting balance due to weakness and poor muscular endurance that includes core and postural muscles. The pt remains highly motivated to improve, and was agreeable to standing attempts, but did need increased assist and blocking of bilateral knees in addition to verbal cues for technique to complete today. She was able to tolerate 5 total min of standing with addition of LE strengthening exercises. The pt's mobility and weakness is further complicated by anxiety and fear of falling, and possible drops in BP with prolonged standing activity. Therefore, I feel she currently needs more assist than is available at home and am recommending acute inpatient rehab at d/c to maximize functional recovery to pt's prior level of independence (ambulating household distances with cane and community distances with rollator).   VITALS:  - supine in bed- BP: 151/71 (93); HR: 80bpm - sitting EOB - BP: 152/90 (95); HR: 75bpm - standing - BP: 155/92 (99);  - standing after 3 min - BP: 102/74 (79); HR: 65bpm *pt reports no dizziness, simply continued fatigue* - sitting EOB - 136/72 (90); HR: 64bpm    Recommendations for follow up therapy are one component of a multi-disciplinary discharge planning process, led by the attending  physician.  Recommendations may be updated based on patient status, additional functional criteria and insurance authorization.  Follow Up Recommendations  Acute inpatient rehab (3hours/day)     Assistance Recommended at Discharge Frequent or constant Supervision/Assistance  Patient can return home with the following Two people to help with walking and/or transfers;Two people to help with bathing/dressing/bathroom;Assistance with cooking/housework;Direct supervision/assist for medications management;Direct supervision/assist for financial management;Assist for transportation;Help with stairs or ramp for entrance   Equipment Recommendations  None recommended by PT    Recommendations for Other Services       Precautions / Restrictions Precautions Precautions: Fall Precaution Comments: poor endurance and awareness of deficits Restrictions Weight Bearing Restrictions: No     Mobility  Bed Mobility Overal bed mobility: Needs Assistance             General bed mobility comments: pt OOB in recliner at start and end of session    Transfers Overall transfer level: Needs assistance Equipment used: Standard walker Transfers: Sit to/from Stand Sit to Stand: Mod assist           General transfer comment: modA to rise with cues forhand placement and blocking of bilateral knees. pt impulsively returning to sitting once, min-modA to manage standing balance as pt at times with full-body instability, picking up RW, and knee buckling    Ambulation/Gait               General Gait Details: unsafe to attempt at this time     Balance Overall balance assessment: Needs assistance Sitting-balance support: Single extremity supported, No upper extremity supported, Feet supported Sitting  balance-Leahy Scale: Fair Sitting balance - Comments: very limited, pt fatigues quickly and collapses back against recliner.   Standing balance support: Bilateral upper extremity supported,  Reliant on assistive device for balance Standing balance-Leahy Scale: Poor Standing balance comment: Reliant on RW for balance with up to [employer] Arousal/Alertness: Awake/alert Behavior During Therapy: WFL for tasks assessed/performed Overall Cognitive Status: Impaired/Different from baseline Area of Impairment: Memory, Safety/judgement, Awareness, Problem solving                     Memory: Decreased short-term memory   Safety/Judgement: Decreased awareness of safety, Decreased awareness of deficits Awareness: Intellectual Problem Solving: Decreased initiation, Difficulty sequencing, Requires verbal cues General Comments: pt motivated, but needing cues for maintaining LE straight when standing, max cues for safety, pt seems to perform more poorly when anxious, responds well to encouragement and cues for technique        Exercises Other Exercises Other Exercises: standing partial squat (very minimal knee bend) with tactile cues at bilateral knees and max verbal cues/instructions. x20 in standing Other Exercises: LAQ with min manual resistance x5 with LLE and x2 with RLE, limited by pain and fatigue. pt unable to achieve full ROM, very jerky movements, pt crying with pain/effort    General Comments General comments (skin integrity, edema, etc.): VSS on RA, BP with drop with continued standing      Pertinent Vitals/Pain Pain Assessment Pain Assessment: Faces Faces Pain Scale: Hurts whole lot Pain Location: R shoulder, bilateral knees (R knee > L knee) Pain Descriptors / Indicators: Aching, Discomfort, Moaning Pain Intervention(s): Limited activity within patient's tolerance, Monitored during session, Repositioned, Heat applied     PT Goals (current goals can now be found in the care plan section) Acute Rehab PT Goals Patient Stated Goal: return to greater independence PT Goal Formulation: With patient Time For Goal  Achievement: 06/03/22 Potential to Achieve Goals: Good Progress towards PT goals: Not progressing toward goals - comment (progressive weakness)    Frequency    Min 3X/week      PT Plan Discharge plan needs to be updated       AM-PAC PT "6 Clicks" Mobility   Outcome Measure  Help needed turning from your back to your side while in a flat bed without using bedrails?: A Little Help needed moving from lying on your back to sitting on the side of a flat bed without using bedrails?: A Little Help needed moving to and from a bed to a chair (including a wheelchair)?: A Lot Help needed standing up from a chair using your arms (e.g., wheelchair or bedside chair)?: A Lot Help needed to walk in hospital room?: Total Help needed climbing 3-5 steps with a railing? : Total 6 Click Score: 12    End of Session Equipment Utilized During Treatment: Gait belt Activity Tolerance: Patient limited by fatigue;Patient limited by pain Patient left: in chair;with call bell/phone within reach;with chair alarm set Nurse Communication: Mobility status PT Visit Diagnosis: Other abnormalities of gait and mobility (R26.89);Muscle weakness (generalized) (M62.81)     Time: 8841-6606 PT Time Calculation (min) (ACUTE ONLY): 32 min  Charges:  $Therapeutic Exercise: 23-37 mins                     West Carbo, PT, DPT   Acute Rehabilitation  Department Pager #: [phone]   Sandra Cockayne 05/21/2022, 1:15 PM

## 2022-05-21 NOTE — Progress Notes (Signed)
Inpatient Rehab Admissions Coordinator Note:   Per updated therapy recommendations patient was screened for CIR candidacy by Michel Santee, PT. At this time, pt appears to be a potential candidate for CIR. I will place an order for rehab consult for full assessment, per our protocol.  Please contact me any with questions.Shann Medal, PT, DPT [phone] 05/21/22 1:56 PM

## 2022-05-21 NOTE — Progress Notes (Signed)
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    Patient was recently admitted 12/25/21 - 12/27/21 for CHF exacerbation - diuresed with Lasix IV 40 mg BID with total of -4.58 L volume removed.  Echo in December 2022 showed improvement in LVEF 40-45% (25-30% in 02/2021) with global hypokinesis, normal RV, and moderately elevated pulmonary pressures.  Discharge weight was 162 lbs.  Discharged on carvedilol 6.25 mg BID and furosemide 40 mg daily and was instructed to stop amlodipine 10 mg daily and BiDIl 20-37.5 mg TID.   On 05/24, she presented to her Cardiology office thinking she had an appointment and endorsed SOB, bilateral LEE, and reduced activity tolerance; MD referred her to [facility] ED.  Patient endorsed progressive symptoms despite recent increase Lasix 40 mg daily > BID at cardiology visit 05/09 where she reported worsening LEE, SOB, orthopnea, and PND.  CXR in the ED showed moderate R pleural effusion with interval progression of R base collapse/consolidation; dual PPM evident.  Repeat CXR 6h later demonstrated improvement in R pleural effusion, cardiomegaly and no evidence of interstitial edema.  Underwent R thoracentesis with 1.3 L removed.  She was started on IV diuresis 05/24, received Lasix IV 40 mg x1 and 60 mg x2 with marginal response , but her 1+ bilateral LE pitting edema was improved.    Breathing had improved on 05/25 s/p thoracentesis and diuresis, but renal function worsened (SCr 2.13 > 2.37, baseline ~1.6) and patient reported dizziness so diuretics were held.  Echo on 05/25 showed improvement in LVEF to 50-55% and elevated LVEDP (21) with new G2DD; still with normal RV and mildly elevated pulmonary  pressures.     Current HF Medications: Diuretic: s/p furosemide IV 60 mg x2, 40 mg x1 Beta Blocker: carvedilol 6.25 mg BID Other: hydralazine 25 mg q8h + Imdur 15 mg daily  Prior to admission HF Medications: Diuretic: furosemide 40 mg BID Beta blocker: carvedilol 6.25 mg BID Other: hydralazine 25 mg BID + Imdur 15 mg daily ; potassium 20 mEq BID  Pertinent Lab Values: Serum creatinine 2.13 > 2.37 > 2.7, BUN 21, Potassium 4.3, Sodium 136, BNP 2083.9, Magnesium 1.8, A1c 9.1% (up from 7.7% in Jan)  Vital Signs: Weight: 201 lbs (admission weight: 150 lbs - not accurate) Blood pressure: 140-60/60-90 Heart rate: 60s - Afib I/O: -1L yesterday; net -345 mL   Medication Assistance / Insurance Benefits Check: Does the patient have prescription insurance?  Yes Type of insurance plan: [name] [employer]  Does the patient qualify for medication assistance through manufacturers or grants?   Yes Eligible grants and/or patient assistance programs: pending Medication assistance applications in progress: pending  Medication assistance applications approved: pending  Outpatient Pharmacy:  Prior to admission outpatient pharmacy: [facility] Is the patient willing to use [facility] at discharge? Pending Is the patient willing to transition their outpatient pharmacy to utilize a [facility] outpatient pharmacy?   Pending    Assessment: 1. 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 NYHA class II-III symptoms. - Diuretics on hold with up-trending SCr  - Continue PTA carvediolol 6.25 mg BID - No ACEi/ARB/ARNI with worsening AKI - No MRA with AKI - No SGLT2i with h/o UTI during March-April  2022 admission and A1c up 7.7% > 9.1% since Jan - Hydralazine 25 mg q8h + Imdur 15 mg daily    Plan: 1) Medication changes recommended at this time: - Increase hydralazine to 50 mg q8h for better BP control - Give IV Mg 2g x1 to maintain Mg > 2 - Keep K > 4 - Stop PTA amlodipine  on discharge to allow for BP room with GDMT initiation/titration pending renal function improvement  2) Patient assistance: [name] = $47  3)  Education  - To be completed prior to discharge  Laurey Arrow, PharmD PGY1 Pharmacy Resident 05/21/2022  8:17 AM

## 2022-05-21 NOTE — Consult Note (Signed)
Chief Complaint: Patient was seen in consultation today for cholecystitis   Referring Physician(s): Dr. Eliseo Squires  Supervising Physician: Mir, Sharen Heck  Patient Status: [facility] - In-pt  History of Present Illness: Tracey Bryant is a 70 y.o. female known to IR from previous cholecystostomy tube placement 03/19/21 by Dr. Serafina Royals.  Since this time, patient has undergone routine exchanges as well as Spyglass stone fragmentation 09/25/21 and 11/04/21, however her cystic duct remained obstructive despite removal of a significant amount of stones and debris. At her most recent attempt 01/08/22, nearly 90% of the stones had been removed with faint opacification of the cystic duct, however the duct could not be cannulated.  She was again left to drainage bag with plans to return, however she has not yet been scheduled.  She now presents to [facility] ED with shortness of breath, fluid overload. She reported to nursing staff that her cholecystostomy has not been draining in the past 2-3 days.  IR consulted for evaluation.   Tracey Bryant is reclining in chair during visit.  Reports nausea overnight with good response to zofran x1.  No difficulty eating and drinking.  States her drain has not been working in 2-3 days.   Past Medical History:  Diagnosis Date   Arthritis    Chronic systolic (congestive) heart failure (HCC)    Diabetes mellitus without complication ([place])    DVT (deep venous thrombosis) ([place]) 07/26/2021   Hypertension    Paroxysmal atrial fibrillation ([place]) 07/26/2021   Stroke [facility])     Past Surgical History:  Procedure Laterality Date   breast tumor     IR CATHETER TUBE CHANGE  09/25/2021   IR CHOLANGIOGRAM EXISTING TUBE  05/13/2021   IR EXCHANGE BILIARY DRAIN  07/27/2021   IR EXCHANGE BILIARY DRAIN  11/04/2021   IR EXCHANGE BILIARY DRAIN  01/08/2022   IR RADIOLOGIST EVAL & MGMT  09/04/2021   IR REMOVAL OF CALCULI/DEBRIS BILIARY DUCT/GB  09/25/2021   IR REMOVAL OF CALCULI/DEBRIS BILIARY DUCT/GB   11/04/2021   IR REMOVAL OF CALCULI/DEBRIS BILIARY DUCT/GB  01/08/2022   PACEMAKER IMPLANT     TONSILLECTOMY      Allergies: Patient has no known allergies.  Medications: Prior to Admission medications   Medication Sig Start Date End Date Taking? Authorizing Provider  amLODipine (NORVASC) 10 MG tablet Take 10 mg by mouth daily. 05/05/22  Yes [provider]  carvedilol (COREG) 6.25 MG tablet Take 1 tablet (6.25 mg total) by mouth 2 (two) times daily with a meal. 08/01/21  Yes [name], MD  furosemide (LASIX) 40 MG tablet Take 1 tablet (40 mg total) by mouth 2 (two) times daily. 05/04/22  Yes Bhagat, Bhavinkumar, PA  hydrALAZINE (APRESOLINE) 25 MG tablet Take 1 tablet (25 mg total) by mouth in the morning and at bedtime. 05/04/22  Yes Bhagat, Bhavinkumar, PA  isosorbide mononitrate (IMDUR) 30 MG 24 hr tablet Take 0.5 tablets (15 mg total) by mouth daily. 05/04/22  Yes Bhagat, Bhavinkumar, PA  potassium chloride SA (KLOR-CON M) 20 MEQ tablet Take 1 tablet (20 mEq total) by mouth 2 (two) times daily. 05/11/22  Yes Bhagat, Bhavinkumar, PA  apixaban (ELIQUIS) 5 MG TABS tablet Take 1 tablet (5 mg total) by mouth 2 (two) times daily. Patient not taking: Reported on 05/19/2022 08/04/21   Pattricia Boss, MD  glipiZIDE (GLUCOTROL XL) 5 MG 24 hr tablet Take 5 mg by mouth daily. Patient not taking: Reported on 05/19/2022 11/03/21   [provider]  Family History  Problem Relation Age of Onset   Cancer Mother    Diabetes Mother    Heart disease Father     Social History   Socioeconomic History   Marital status: Divorced    Spouse name: Not on file   Number of children: Not on file   Years of education: Not on file   Highest education level: Not on file  Occupational History   Occupation: retired  Tobacco Use   Smoking status: Never   Smokeless tobacco: Never  Vaping Use   Vaping Use: Never used  Substance and Sexual Activity   Alcohol use: No   Drug use: Never    Sexual activity: Not on file  Other Topics Concern   Not on file  Social History Narrative   Not on file   Social Determinants of Health   Financial Resource Strain: Not on file  Food Insecurity: Not on file  Transportation Needs: Not on file  Physical Activity: Not on file  Stress: Not on file  Social Connections: Not on file     Review of Systems: A 12 point ROS discussed and pertinent positives are indicated in the HPI above.  All other systems are negative.  Review of Systems  Constitutional:  Positive for fatigue. Negative for fever.  Respiratory:  Positive for cough and shortness of breath.   Cardiovascular:  Negative for chest pain.  Gastrointestinal:  Positive for nausea. Negative for abdominal pain.  Musculoskeletal:  Negative for back pain.  Psychiatric/Behavioral:  Negative for behavioral problems and confusion.    Vital Signs: BP (!) 162/82   Pulse 60   Temp 98.2 F (36.8 C) (Oral)   Resp 19   Ht '5\' 6"'$  (1.676 m)   Wt 201 lb 15.1 oz (91.6 kg)   SpO2 96%   BMI 32.59 kg/m   Physical Exam Vitals and nursing note reviewed.  Constitutional:      General: She is not in acute distress.    Appearance: She is well-developed. She is not ill-appearing.  Neurological:     Mental Status: She is alert.  Abdomen:  Cholecystostomy tube in place.  Insertion site intact.  Skin suture is no longer intact, however stat lock remains.  Drain flushes easily with immediate return of foul-smelling, dark bilious output. Gravity bag is empty.        Imaging: DG Chest 2 View  Result Date: 05/19/2022 CLINICAL DATA:  Shortness of breath. EXAM: CHEST - 2 VIEW COMPARISON:  12/25/2021 FINDINGS: Low volume film. Interval progression of right base collapse/consolidation with moderate right pleural effusion. Minimal atelectasis or infiltrate noted left base with probable tiny left pleural effusion. The cardio pericardial silhouette is enlarged. Dual lead permanent pacemaker evident.  IMPRESSION: Interval progression of right base collapse/consolidation with moderate right pleural effusion. Electronically Signed   By: Misty Stanley M.D.   On: 05/19/2022 11:07   DG CHEST PORT 1 VIEW  Result Date: 05/19/2022 CLINICAL DATA:  Shortness of breath, respiratory distress EXAM: PORTABLE CHEST 1 VIEW COMPARISON:  05/19/2022 at 1123 hours FINDINGS: Small right pleural effusion, improved. Left lung is clear. No frank interstitial edema. Cardiomegaly. Thoracic aortic atherosclerosis. Left subclavian pacemaker. IMPRESSION: Small right pleural effusion, improved. No frank interstitial edema. Electronically Signed   By: Julian Hy M.D.   On: 05/19/2022 17:32   ECHOCARDIOGRAM COMPLETE  Result Date: 05/20/2022    ECHOCARDIOGRAM REPORT   Patient Name:   Tracey Bryant Date of Exam: 05/20/2022 Medical Rec #:  [id number]       Height:       66.0 in Accession #:    [id number]      Weight:       150.0 lb Date of Birth:  July 08, 1951       BSA:          1.770 m Patient Age:    74 years        BP:           126/67 mmHg Patient Gender: F               HR:           63 bpm. Exam Location:  Inpatient Procedure: 2D Echo, Cardiac Doppler and Color Doppler Indications:    CHF-Acute Systolic 937.90 / W40.97  History:        Patient has prior history of Echocardiogram examinations, most                 recent 12/26/2021. CHF, Stroke, Arrythmias:Atrial Fibrillation;                 Risk Factors:Hypertension and Diabetes.  Sonographer:    Bernadene Person RDCS Referring Phys: Bryant  1. Left ventricular ejection fraction, by estimation, is 50 to 55%. The left ventricle has low normal function. The left ventricle has no regional wall motion abnormalities. Left ventricular diastolic parameters are consistent with Grade II diastolic dysfunction (pseudonormalization). Elevated left ventricular end-diastolic pressure. The E/e' is 21.  2. Right ventricular systolic function is normal. The right  ventricular size is mildly enlarged. There is mildly elevated pulmonary artery systolic pressure. The estimated right ventricular systolic pressure is 35.3 mmHg.  3. Left atrial size was mildly dilated.  4. The mitral valve is abnormal. Trivial mitral valve regurgitation.  5. The aortic valve is tricuspid. Aortic valve regurgitation is not visualized.  6. The inferior vena cava is normal in size with <50% respiratory variability, suggesting right atrial pressure of 8 mmHg. Comparison(s): Changes from prior study are noted. 12/26/2021: LVEF 40-45%, RVSP 45.8 mmHg. FINDINGS  Left Ventricle: Left ventricular ejection fraction, by estimation, is 50 to 55%. The left ventricle has low normal function. The left ventricle has no regional wall motion abnormalities. The left ventricular internal cavity size was normal in size. There is no left ventricular hypertrophy. Left ventricular diastolic parameters are consistent with Grade II diastolic dysfunction (pseudonormalization). Elevated left ventricular end-diastolic pressure. The E/e' is 21. Right Ventricle: The right ventricular size is mildly enlarged. No increase in right ventricular wall thickness. Right ventricular systolic function is normal. There is mildly elevated pulmonary artery systolic pressure. The tricuspid regurgitant velocity is 2.75 m/s, and with an assumed right atrial pressure of 8 mmHg, the estimated right ventricular systolic pressure is 29.9 mmHg. Left Atrium: Left atrial size was mildly dilated. Right Atrium: Right atrial size was normal in size. Pericardium: There is no evidence of pericardial effusion. Mitral Valve: The mitral valve is abnormal. There is mild calcification of the posterior mitral valve leaflet(s). Trivial mitral valve regurgitation. Tricuspid Valve: The tricuspid valve is grossly normal. Tricuspid valve regurgitation is trivial. Aortic Valve: The aortic valve is tricuspid. Aortic valve regurgitation is not visualized. Pulmonic  Valve: The pulmonic valve was normal in structure. Pulmonic valve regurgitation is not visualized. Aorta: The aortic root and ascending aorta are structurally normal, with no evidence of dilitation. Venous: The inferior vena cava is normal in size with less than 50% respiratory variability, suggesting  right atrial pressure of 8 mmHg. IAS/Shunts: No atrial level shunt detected by color flow Doppler.  LEFT VENTRICLE PLAX 2D LVIDd:         4.50 cm     Diastology LVIDs:         3.20 cm     LV e' medial:    5.01 cm/s LV PW:         1.00 cm     LV E/e' medial:  21.2 LV IVS:        1.10 cm     LV e' lateral:   5.06 cm/s LVOT diam:     2.00 cm     LV E/e' lateral: 20.9 LV SV:         57 LV SV Index:   32 LVOT Area:     3.14 cm  LV Volumes (MOD) LV vol d, MOD A2C: 94.8 ml LV vol d, MOD A4C: 99.9 ml LV vol s, MOD A2C: 42.9 ml LV vol s, MOD A4C: 43.3 ml LV SV MOD A2C:     51.9 ml LV SV MOD A4C:     99.9 ml LV SV MOD BP:      55.3 ml RIGHT VENTRICLE RV S prime:     11.80 cm/s TAPSE (M-mode): 1.3 cm LEFT ATRIUM             Index        RIGHT ATRIUM           Index LA diam:        4.00 cm 2.26 cm/m   RA Area:     16.70 cm LA Vol (A2C):   62.2 ml 35.15 ml/m  RA Volume:   41.00 ml  23.17 ml/m LA Vol (A4C):   54.6 ml 30.85 ml/m LA Biplane Vol: 61.9 ml 34.98 ml/m  AORTIC VALVE LVOT Vmax:   85.40 cm/s LVOT Vmean:  55.400 cm/s LVOT VTI:    0.181 m  AORTA Ao Root diam: 3.10 cm Ao Asc diam:  3.20 cm MITRAL VALVE                TRICUSPID VALVE MV Area (PHT): 2.99 cm     TR Peak grad:   30.2 mmHg MV Decel Time: 254 msec     TR Vmax:        275.00 cm/s MV E velocity: 106.00 cm/s MV A velocity: 36.20 cm/s   SHUNTS MV E/A ratio:  2.93         Systemic VTI:  0.18 m                             Systemic Diam: 2.00 cm Lyman Bishop MD Electronically signed by Lyman Bishop MD Signature Date/Time: 05/20/2022/2:25:44 PM    Final     Labs:  CBC: Recent Labs    01/08/22 0805 01/08/22 0914 05/19/22 1040 05/20/22 0438 05/21/22 0256   WBC 7.6  --  8.5 6.9 6.3  HGB 12.8 12.2 12.4 11.1* 10.4*  HCT 39.4 36.0 39.8 35.2* 32.8*  PLT 357  --  371 298 293    COAGS: Recent Labs    11/04/21 0818 01/08/22 0805  INR 1.1 0.9    BMP: Recent Labs    01/08/22 0805 01/08/22 0914 05/04/22 1625 05/19/22 1040 05/20/22 0438 05/21/22 0256  NA 137   < > 141 139 137 136  K 6.3*   < > 3.7 3.9 4.1 4.3  CL 108   < > 106 108 107 103  CO2 23  --  '20 22 23 24  '$ GLUCOSE 128*   < > 139* 236* 250* 303*  BUN 14   < > '18 15 19 21  '$ CALCIUM 8.0*  --  8.8 8.9 8.3* 8.0*  CREATININE 1.63*   < > 2.27* 2.13* 2.37* 2.70*  GFRNONAA 34*  --   --  24* 22* 18*   < > = values in this interval not displayed.    LIVER FUNCTION TESTS: Recent Labs    08/04/21 1350 12/25/21 2110 12/26/21 0336 05/21/22 0256  BILITOT 0.7 0.9 0.7 0.5  AST '19 17 18 '$ 11*  ALT '13 12 13 9  '$ ALKPHOS 55 73 64 56  PROT 7.0 7.4 6.7 5.3*  ALBUMIN 2.9* 3.1* 2.7* 1.9*    TUMOR MARKERS: No results for input(s): AFPTM, CEA, CA199, CHROMGRNA in the last 8760 hours.  Assessment and Plan: Cholecystitis with chronic cystic duct occlusion s/p percutaneous cholecystostomy tube placement 02/2021 by Dr. Serafina Royals with Spyglass procedure x3.  Cholecystostomy tube remains intact.  Suture is no longer intact, however tube in place and does not appear retracted.  Flushes easily with forceful return of bilious output.  Gravity bag clogged.  Replaced bag with immediate decompression. Discussed with RN.   Added flush orders q shift.  Please document output daily as well.   IR to follow intermittently.   Thank you for this interesting consult.  I greatly enjoyed meeting Jaely D Abid and look forward to participating in their care.  A copy of this report was sent to the requesting provider on this date.  Electronically Signed: Docia Barrier, PA 05/21/2022, 11:23 AM   I spent a total of 20 Minutes    in face to face in clinical consultation, greater than 50% of which  was counseling/coordinating care for non-functioning cholecystostomy tube.

## 2022-05-21 NOTE — Progress Notes (Signed)
Occupational Therapy Treatment Patient Details Name: Tracey Bryant MRN: [id number] DOB: 03/03/1951 Today's Date: 05/21/2022   History of present illness The pt is a 71 yo female presenting from home 5/24 with SOB and BLE swelling. Found to have acute CHF exacerbation and R pleural effusion s/p thoracentesis 5/24. PMH includes: arthritis, CHF, DM II, DVT, HTN, afib, and stroke.   OT comments  Patient received in supine and eager to participate. Patient was able to get to EOB with min guard assist using bed rails. Patient attempted to ambulate to sink from EOB and demonstrated unsafe balance and knees buckling. Patient performed transfer from EOB to chair with face to face technique and improved balance and stability. Patient performed self care tasks seated at sink. Patient would benefit from further OT services to increase safety with transfers.    Recommendations for follow up therapy are one component of a multi-disciplinary discharge planning process, led by the attending physician.  Recommendations may be updated based on patient status, additional functional criteria and insurance authorization.    Follow Up Recommendations  Other (comment) (dependends on progress. Rec SNF vs HHOT if patient is willing to go)    Assistance Recommended at Discharge Intermittent Supervision/Assistance  Patient can return home with the following  A little help with walking and/or transfers;A little help with bathing/dressing/bathroom;Assistance with cooking/housework;Help with stairs or ramp for entrance   Equipment Recommendations  Other (comment) (continues to assess)    Recommendations for Other Services      Precautions / Restrictions Precautions Precautions: Fall Precaution Comments: poor endurance and awareness of deficits Restrictions Weight Bearing Restrictions: No       Mobility Bed Mobility Overal bed mobility: Needs Assistance Bed Mobility: Supine to Sit     Supine to sit: Min  guard, HOB elevated     General bed mobility comments: increased time for patient to perform with min guard and use of rails    Transfers Overall transfer level: Needs assistance Equipment used: Standard walker, None Transfers: Sit to/from Stand, Bed to chair/wheelchair/BSC Sit to Stand: Min guard, Min assist     Step pivot transfers: Min assist     General transfer comment: patient attempted to stand from EOB with RW and demonstrated knee bucking and appeared unsafe to attempt with RW. Performed transfer from EOB to chair with face to face technique with patient demonstrating increased stability and safety     Balance Overall balance assessment: Needs assistance Sitting-balance support: Single extremity supported, No upper extremity supported, Feet supported Sitting balance-Leahy Scale: Fair     Standing balance support: Bilateral upper extremity supported, Reliant on assistive device for balance Standing balance-Leahy Scale: Poor Standing balance comment: Reliant on RW for balance                           ADL either performed or assessed with clinical judgement   ADL Overall ADL's : Needs assistance/impaired     Grooming: Wash/dry hands;Wash/dry face;Oral care;Set up;Sitting Grooming Details (indicate cue type and reason): at sink, did not perform in standing due to knees buckling Upper Body Bathing: Modified independent;Set up;Sitting Upper Body Bathing Details (indicate cue type and reason): seated at sink                           General ADL Comments: patient performed self care tasks seated at sink due to knees buckling.    Extremity/Trunk Assessment  Vision       Perception     Praxis      Cognition Arousal/Alertness: Awake/alert Behavior During Therapy: WFL for tasks assessed/performed Overall Cognitive Status: Impaired/Different from baseline Area of Impairment: Memory, Safety/judgement, Awareness, Problem  solving                     Memory: Decreased short-term memory   Safety/Judgement: Decreased awareness of safety, Decreased awareness of deficits Awareness: Intellectual Problem Solving: Decreased initiation, Difficulty sequencing, Requires verbal cues General Comments: eager        Exercises      Shoulder Instructions       General Comments      Pertinent Vitals/ Pain       Pain Assessment Pain Assessment: Faces Faces Pain Scale: Hurts little more Pain Location: BLE knees Pain Descriptors / Indicators: Aching, Discomfort Pain Intervention(s): Monitored during session, Repositioned, Limited activity within patient's tolerance  Home Living                                          Prior Functioning/Environment              Frequency  Min 2X/week        Progress Toward Goals  OT Goals(current goals can now be found in the care plan section)  Progress towards OT goals: Progressing toward goals  Acute Rehab OT Goals Patient Stated Goal: get better OT Goal Formulation: With patient Time For Goal Achievement: 06/03/22 Potential to Achieve Goals: Fair ADL Goals Pt Will Perform Lower Body Dressing: with modified independence;with adaptive equipment;sitting/lateral leans;sit to/from stand Pt Will Transfer to Toilet: with modified independence;ambulating;bedside commode Pt Will Perform Toileting - Clothing Manipulation and hygiene: with modified independence;sitting/lateral leans;sit to/from stand Additional ADL Goal #1: Pt will be Mod I bed mobility in preparation for increased participation w/ ADL's Additional ADL Goal #2: Pt will state and implement 1-2 energy conservation techniques w/ less than 2 vc's (issue and review handout)  Plan Discharge plan remains appropriate    Co-evaluation                 AM-PAC OT "6 Clicks" Daily Activity     Outcome Measure   Help from another person eating meals?: None Help from another  person taking care of personal grooming?: A Little Help from another person toileting, which includes using toliet, bedpan, or urinal?: A Little Help from another person bathing (including washing, rinsing, drying)?: A Lot Help from another person to put on and taking off regular upper body clothing?: A Little Help from another person to put on and taking off regular lower body clothing?: A Lot 6 Click Score: 17    End of Session Equipment Utilized During Treatment: Gait belt;Rolling walker (2 wheels)  OT Visit Diagnosis: Unsteadiness on feet (R26.81);Muscle weakness (generalized) (M62.81)   Activity Tolerance Patient tolerated treatment well   Patient Left in chair;with call bell/phone within reach   Nurse Communication Mobility status        Time: 4098-1191 OT Time Calculation (min): 29 min  Charges: OT General Charges $OT Visit: 1 Visit OT Treatments $Self Care/Home Management : 23-37 mins  Lodema Hong, [place]  Pager [phone] Office [place] 05/21/2022, 9:48 AM

## 2022-05-21 NOTE — Significant Event (Signed)
Patient's nurse notified me that patient's cholecystostomy tube has not been draining for the last 2 days and also sutures around the cholecystostomy tube has been loosened.  On exam at bedside patient denies any abdominal pain.  Has some nausea.  Patient is afebrile.  Abdomen not tender Will check LFTs and metabolic panel.  Will consult interventional radiology for further recommendations.  Dr. Hal Hope.

## 2022-05-21 NOTE — TOC Progression Note (Signed)
Transition of Care [facility]) - Progression Note    Patient Details  Name: Tracey Bryant MRN: [id number] Date of Birth: [birth year]  Transition of Care [facility]) CM/SW [place], RN Phone Number: 05/21/2022, 11:54 AM  Clinical Narrative:     Spoke to patient regarding DC planning. Patient declining Home Health services, she is going to go to her daughters house when she discharges. Her daughter lives next door to her house. She has a son that also checks on her and grandchildren who like to help.  She appears short of breath on talking, she is pleasant and states she feels better.  Her medications are being adjusted, she currently is not on oxygen.   CM will follow for needs, recommendations, and transitions.Patient does have a T-tube biliary drain that she has had for some time and knows how to manage it  Expected Discharge Plan: Home/Self Care Barriers to Discharge: Continued Medical Work up  Expected Discharge Plan and Services Expected Discharge Plan: Home/Self Care   Discharge Planning Services: CM Consult   Living arrangements for the past 2 months: Single Family Home                           HH Arranged: Refused HH           Social Determinants of Health (SDOH) Interventions    Readmission Risk Interventions    12/27/2021    2:32 PM 03/19/2021    9:34 AM 03/17/2021   11:56 AM  Readmission Risk Prevention Plan  Transportation Screening Complete  Complete  PCP or Specialist Appt within 5-7 Days  Complete   PCP or Specialist Appt within 3-5 Days Complete    Home Care Screening  Complete   Medication Review (RN CM)   Complete  HRI or Home Care Consult Complete    Social Work Consult for [place] Planning/Counseling Complete    Palliative Care Screening Not Applicable    Medication Review Press photographer) Complete

## 2022-05-22 DIAGNOSIS — N179 Acute kidney failure, unspecified: Secondary | ICD-10-CM | POA: Diagnosis not present

## 2022-05-22 DIAGNOSIS — N1832 Chronic kidney disease, stage 3b: Secondary | ICD-10-CM

## 2022-05-22 DIAGNOSIS — Z8673 Personal history of transient ischemic attack (TIA), and cerebral infarction without residual deficits: Secondary | ICD-10-CM

## 2022-05-22 DIAGNOSIS — J9601 Acute respiratory failure with hypoxia: Secondary | ICD-10-CM | POA: Diagnosis not present

## 2022-05-22 DIAGNOSIS — I5043 Acute on chronic combined systolic (congestive) and diastolic (congestive) heart failure: Secondary | ICD-10-CM | POA: Diagnosis not present

## 2022-05-22 DIAGNOSIS — I5023 Acute on chronic systolic (congestive) heart failure: Secondary | ICD-10-CM | POA: Diagnosis not present

## 2022-05-22 DIAGNOSIS — R5381 Other malaise: Secondary | ICD-10-CM

## 2022-05-22 DIAGNOSIS — I480 Paroxysmal atrial fibrillation: Secondary | ICD-10-CM | POA: Diagnosis not present

## 2022-05-22 DIAGNOSIS — K811 Chronic cholecystitis: Secondary | ICD-10-CM | POA: Diagnosis not present

## 2022-05-22 LAB — CBC
HCT: 33.3 % — ABNORMAL LOW (ref 36.0–46.0)
Hemoglobin: 10.3 g/dL — ABNORMAL LOW (ref 12.0–15.0)
MCH: 28.3 pg (ref 26.0–34.0)
MCHC: 30.9 g/dL (ref 30.0–36.0)
MCV: 91.5 fL (ref 80.0–100.0)
Platelets: 318 10*3/uL (ref 150–400)
RBC: 3.64 MIL/uL — ABNORMAL LOW (ref 3.87–5.11)
RDW: 15.4 % (ref 11.5–15.5)
WBC: 6.7 10*3/uL (ref 4.0–10.5)
nRBC: 0 % (ref 0.0–0.2)

## 2022-05-22 LAB — BASIC METABOLIC PANEL
Anion gap: 8 (ref 5–15)
BUN: 25 mg/dL — ABNORMAL HIGH (ref 8–23)
CO2: 24 mmol/L (ref 22–32)
Calcium: 8 mg/dL — ABNORMAL LOW (ref 8.9–10.3)
Chloride: 105 mmol/L (ref 98–111)
Creatinine, Ser: 2.79 mg/dL — ABNORMAL HIGH (ref 0.44–1.00)
GFR, Estimated: 18 mL/min — ABNORMAL LOW (ref >60)
Glucose, Bld: 192 mg/dL — ABNORMAL HIGH (ref 70–99)
Potassium: 4.3 mmol/L (ref 3.5–5.1)
Sodium: 137 mmol/L (ref 135–145)

## 2022-05-22 LAB — GLUCOSE, CAPILLARY
Glucose-Capillary: 200 mg/dL — ABNORMAL HIGH (ref 70–99)
Glucose-Capillary: 236 mg/dL — ABNORMAL HIGH (ref 70–99)
Glucose-Capillary: 252 mg/dL — ABNORMAL HIGH (ref 70–99)
Glucose-Capillary: 220 mg/dL — ABNORMAL HIGH (ref 70–99)

## 2022-05-22 MED ORDER — FUROSEMIDE 10 MG/ML IJ SOLN
40.0000 mg | Freq: Two times a day (BID) | INTRAMUSCULAR | Status: DC
  Administered 2022-05-22: 40 mg via INTRAVENOUS
  Filled 2022-05-22: qty 4

## 2022-05-22 NOTE — Progress Notes (Signed)
Inpatient Rehab Admissions Coordinator:     I spoke with Pt. Regarding potential CIR admit. She states interest and that her daughter can assist 24/ at d/c. I will open a case with her insurance and pursue for insurance auth.   Clemens Catholic, [place], [place] Admissions Coordinator  [phone] ([place]) [phone] (office)

## 2022-05-22 NOTE — Assessment & Plan Note (Signed)
-   Last A1c 7.7% - Continue SSI and CBG monitoring

## 2022-05-22 NOTE — Assessment & Plan Note (Signed)
-   Continue Coreg, hydralazine, Imdur

## 2022-05-22 NOTE — Assessment & Plan Note (Addendum)
-  patient has history of CKD4. Baseline creat ~ 1.6 - 1.9, eGFR 25 -Lasix restarted on 05/22/2022 - Etiology possibly due to CRS from underlying CHF; ATN also a possibility from hypoxia on admission - bladder scan negative for retention; voiding well - nephrology consulted given worsening renal function; no indication currently to warrant HD - possible outpt kidney biopsy given nephrotic range proteinuria  - continue monitoring output

## 2022-05-22 NOTE — Assessment & Plan Note (Signed)
-   Continue Eliquis 

## 2022-05-22 NOTE — Progress Notes (Addendum)
Progress Note    Tracey Bryant   FBP:[id number]  DOB: [birth year]  DOA: 05/19/2022     2 PCP: Coolidge Breeze, FNP  Initial CC: Shortness of breath  Hospital Course: Tracey Bryant is a 71 y.o. female with PMH cholecystitis with cholecystostomy tube in place (initially placed 03/19/2021), afib, HTN, DMII, sCHF, CVA who presented with shortness of breath.  She also had reported increased lower extremity swelling/overload with no improvement on her home Lasix. She underwent thoracentesis on 05/19/2022 removing 1.3 L.  Initially Lasix was held after developing some dizziness. Respiratory status improved after thoracentesis.    She also was evaluated by IR due to difficulty with cholecystostomy tube drainage.  Tube was evaluated and noted intact however suture had been removed but tube had not been retracted any.  After forceful flushing, biliary output was achieved with no further issues.   Interval History:  Seen this morning with improvement in her breathing since admission.  Not on oxygen at home typically.  Abdomen still sore which is not uncommon for her and biliary drain now with better output after flushing with radiology.  Assessment and Plan: * Acute on chronic combined systolic and diastolic congestive heart failure (HCC) - Moderate to large right-sided pleural effusion on admission along with worsening dyspnea/SOB, increased lower extremity edema and no improvement with home Lasix -Underwent thoracentesis on 5/24 with removal of 1.3 L -Initially Lasix held due to some dizziness and worsening renal function however lower extremity edema persists and renal function possibly plateau and component of CRS; Lasix restarted per cardiology on 05/22/2022 - Continue monitoring output and renal function  Pleural effusion due to CHF (congestive heart failure) (HCC)-resolved as of 05/22/2022 - Due to underlying CHF - S/p right thoracentesis on 05/19/2022, removing 1.3 L  Acute renal failure  superimposed on stage 4 chronic kidney disease ([place]) - patient has history of CKD4. Baseline creat ~ 1.6 - 1.9, eGFR 25 -Lasix restarted on 05/22/2022 - Etiology possibly due to CRS from underlying CHF - Monitor output and repeat BMP in a.m.  Acute respiratory failure with hypoxia (HCC) - Due to underlying pleural effusion initially on admission and volume overload - Continue Lasix - Wean oxygen off as able.  Not on home O2  Cholecystitis, chronic - Diagnosed with acute cholecystitis March 2022.  Underwent cholecystostomy tube placement on 03/19/2021 - Tube output had decreased on admission and underwent flushing with radiology with return of function -Continue flushing drain each shift and document output  Physical deconditioning - Evaluated by PT/OT - Recommendations are for acute inpatient rehab if approved - Follow-up pending CIR evaluation  History of CVA (cerebrovascular accident) - Continue Eliquis  Paroxysmal atrial fibrillation ([place]) - Continue Coreg and Eliquis  Uncontrolled type 2 diabetes mellitus with hyperglycemia, with long-term current use of insulin (HCC) - Last A1c 7.7% - Continue SSI and CBG monitoring  Hypertension - Continue Coreg, hydralazine, Imdur    Old records reviewed in assessment of this patient  Antimicrobials:   DVT prophylaxis:   apixaban (ELIQUIS) tablet 5 mg   Code Status:   Code Status: Full Code  Disposition Plan: Pending CIR evaluation Status is: Inpatient  Objective: Blood pressure (!) 142/75, pulse 69, temperature (!) 97.4 F (36.3 C), temperature source Oral, resp. rate 15, height $RemoveBe'5\' 6"'oBFUmXTdF$  (1.676 m), weight 92.7 kg, SpO2 98 %.  Examination:  Physical Exam Constitutional:      General: She is not in acute distress.    Appearance: She is well-developed.  HENT:     Head: Normocephalic and atraumatic.     Mouth/Throat:     Mouth: Mucous membranes are moist.  Eyes:     Extraocular Movements: Extraocular movements intact.   Cardiovascular:     Rate and Rhythm: Normal rate and regular rhythm.  Pulmonary:     Effort: Pulmonary effort is normal.     Breath sounds: Normal breath sounds.  Abdominal:     General: Bowel sounds are normal. There is no distension.     Palpations: Abdomen is soft.     Comments: Expected tenderness to palpation around cholecystostomy tube which is chronic.  Biliary fluid appreciated in bag  Musculoskeletal:        General: Swelling present.     Cervical back: Normal range of motion and neck supple.     Comments: 2+ bilateral lower extremity pitting edema  Skin:    General: Skin is warm and dry.  Neurological:     General: No focal deficit present.     Mental Status: She is alert.  Psychiatric:        Mood and Affect: Mood normal.        Behavior: Behavior normal.     Consultants:  Cardiology IR  Procedures:  Right thoracentesis, 05/19/2022 Biliary drain flushing per IR, 05/21/2022  Data Reviewed: Results for orders placed or performed during the hospital encounter of 05/19/22 (from the past 24 hour(s))  Glucose, capillary     Status: Abnormal   Collection Time: 05/21/22  4:26 PM  Result Value Ref Range   Glucose-Capillary 260 (H) 70 - 99 mg/dL  Glucose, capillary     Status: Abnormal   Collection Time: 05/21/22  9:11 PM  Result Value Ref Range   Glucose-Capillary 220 (H) 70 - 99 mg/dL   Comment 1 Notify RN    Comment 2 Document in Chart   CBC     Status: Abnormal   Collection Time: 05/22/22  4:50 AM  Result Value Ref Range   WBC 6.7 4.0 - 10.5 K/uL   RBC 3.64 (L) 3.87 - 5.11 MIL/uL   Hemoglobin 10.3 (L) 12.0 - 15.0 g/dL   HCT 33.3 (L) 36.0 - 46.0 %   MCV 91.5 80.0 - 100.0 fL   MCH 28.3 26.0 - 34.0 pg   MCHC 30.9 30.0 - 36.0 g/dL   RDW 15.4 11.5 - 15.5 %   Platelets 318 150 - 400 K/uL   nRBC 0.0 0.0 - 0.2 %  Basic metabolic panel     Status: Abnormal   Collection Time: 05/22/22  4:50 AM  Result Value Ref Range   Sodium 137 135 - 145 mmol/L   Potassium 4.3  3.5 - 5.1 mmol/L   Chloride 105 98 - 111 mmol/L   CO2 24 22 - 32 mmol/L   Glucose, Bld 192 (H) 70 - 99 mg/dL   BUN 25 (H) 8 - 23 mg/dL   Creatinine, Ser 2.79 (H) 0.44 - 1.00 mg/dL   Calcium 8.0 (L) 8.9 - 10.3 mg/dL   GFR, Estimated 18 (L) >60 mL/min   Anion gap 8 5 - 15  Glucose, capillary     Status: Abnormal   Collection Time: 05/22/22  6:28 AM  Result Value Ref Range   Glucose-Capillary 200 (H) 70 - 99 mg/dL   Comment 1 Notify RN    Comment 2 Document in Chart   Glucose, capillary     Status: Abnormal   Collection Time: 05/22/22 11:42 AM  Result Value  Ref Range   Glucose-Capillary 236 (H) 70 - 99 mg/dL    I have Reviewed nursing notes, Vitals, and Lab results since pt's last encounter. Pertinent lab results : see above I have ordered test including BMP, CBC, Mg I have reviewed the last note from staff over past 24 hours I have discussed pt's care plan and test results with nursing staff, case manager   LOS: 2 days   Dwyane Dee, MD [facility] 05/22/2022, 12:24 PM

## 2022-05-22 NOTE — Assessment & Plan Note (Signed)
-   Evaluated by PT/OT - Recommendations are for acute inpatient rehab if approved - Follow-up pending CIR evaluation

## 2022-05-22 NOTE — Assessment & Plan Note (Signed)
-   Due to underlying pleural effusion initially on admission and volume overload - Continue Lasix - Wean oxygen off as able.  Not on home O2 - CXR shows worsening edema on 5/28

## 2022-05-22 NOTE — Assessment & Plan Note (Signed)
-   Continue Coreg and Eliquis 

## 2022-05-22 NOTE — Progress Notes (Signed)
Progress Note  Patient Name: Tracey Bryant Date of Encounter: 05/22/2022  [facility] Cardiologist: Minus Breeding, MD   Subjective   Feels very short of breath and swollen today. Fatigued, did not sleep well.  Inpatient Medications    Scheduled Meds:  apixaban  5 mg Oral BID   carvedilol  6.25 mg Oral BID WC   docusate sodium  100 mg Oral BID   feeding supplement (GLUCERNA SHAKE)  237 mL Oral TID BM   furosemide  40 mg Intravenous BID   hydrALAZINE  50 mg Oral Q8H   insulin aspart  0-15 Units Subcutaneous TID WC   insulin aspart  0-5 Units Subcutaneous QHS   insulin glargine-yfgn  12 Units Subcutaneous Daily   isosorbide mononitrate  15 mg Oral Daily   multivitamin with minerals  1 tablet Oral Daily   sodium chloride flush  3 mL Intravenous Q12H   Continuous Infusions:  PRN Meds: acetaminophen **OR** acetaminophen, bisacodyl, hydrALAZINE, morphine injection, ondansetron **OR** ondansetron (ZOFRAN) IV, oxyCODONE, polyethylene glycol, traZODone   Vital Signs    Vitals:   05/21/22 1950 05/22/22 0458 05/22/22 0653 05/22/22 0851  BP:  127/69 (!) 143/76 127/60  Pulse:  (!) 59  63  Resp:  18  16  Temp:  97.6 F (36.4 C)  (!) 97.4 F (36.3 C)  TempSrc:  Oral  Oral  SpO2: 96% 98%  96%  Weight:  92.7 kg    Height:        Intake/Output Summary (Last 24 hours) at 05/22/2022 1142 Last data filed at 05/22/2022 0840 Gross per 24 hour  Intake 728 ml  Output 1275 ml  Net -547 ml      05/22/2022    4:58 AM 05/21/2022    6:23 AM 05/19/2022   10:21 AM  Last 3 Weights  Weight (lbs) 204 lb 5.9 oz 201 lb 15.1 oz 150 lb  Weight (kg) 92.7 kg 91.6 kg 68.04 kg      Telemetry    Predominantly a paced/v sensed with intermittent sinus beats - Personally Reviewed  ECG    No new since 5/24 - Personally Reviewed  Physical Exam   GEN: No acute distress.   Neck: JVD mid neck at almost 90 degrees Cardiac: RRR, no murmurs, rubs, or gallops.  Respiratory: Clear to  auscultation bilaterally in upper fields, crackles at bases GI: Soft, nontender, non-distended  MS: bilateral firm 2+ pitting LE edema; No deformity. Neuro:  Nonfocal  Psych: Normal affect   Labs    High Sensitivity Troponin:  No results for input(s): TROPONINIHS in the last 720 hours.   Chemistry Recent Labs  Lab 05/20/22 0438 05/21/22 0256 05/22/22 0450  NA 137 136 137  K 4.1 4.3 4.3  CL 107 103 105  CO2 '23 24 24  '$ GLUCOSE 250* 303* 192*  BUN 19 21 25*  CREATININE 2.37* 2.70* 2.79*  CALCIUM 8.3* 8.0* 8.0*  MG 1.8  --   --   PROT  --  5.3*  --   ALBUMIN  --  1.9*  --   AST  --  11*  --   ALT  --  9  --   ALKPHOS  --  56  --   BILITOT  --  0.5  --   GFRNONAA 22* 18* 18*  ANIONGAP '7 9 8    '$ Lipids No results for input(s): CHOL, TRIG, HDL, LABVLDL, LDLCALC, CHOLHDL in the last 168 hours.  Hematology Recent Labs  Lab 05/20/22  2542 05/21/22 0256 05/22/22 0450  WBC 6.9 6.3 6.7  RBC 3.86* 3.64* 3.64*  HGB 11.1* 10.4* 10.3*  HCT 35.2* 32.8* 33.3*  MCV 91.2 90.1 91.5  MCH 28.8 28.6 28.3  MCHC 31.5 31.7 30.9  RDW 15.5 15.3 15.4  PLT 298 293 318   Thyroid No results for input(s): TSH, FREET4 in the last 168 hours.  BNP Recent Labs  Lab 05/19/22 1040  BNP 2,083.9*    DDimer No results for input(s): DDIMER in the last 168 hours.   Radiology    ECHOCARDIOGRAM COMPLETE  Result Date: 05/20/2022    ECHOCARDIOGRAM REPORT   Patient Name:   Tracey Bryant Date of Exam: 05/20/2022 Medical Rec #:  [id number]       Height:       66.0 in Accession #:    [id number]      Weight:       150.0 lb Date of Birth:  Apr 11, 1951       BSA:          1.770 m Patient Age:    71 years        BP:           126/67 mmHg Patient Gender: F               HR:           63 bpm. Exam Location:  Inpatient Procedure: 2D Echo, Cardiac Doppler and Color Doppler Indications:    CHF-Acute Systolic 371.06 / Y69.48  History:        Patient has prior history of Echocardiogram examinations, most                  recent 12/26/2021. CHF, Stroke, Arrythmias:Atrial Fibrillation;                 Risk Factors:Hypertension and Diabetes.  Sonographer:    Bernadene Person RDCS Referring Phys: [place]  1. Left ventricular ejection fraction, by estimation, is 50 to 55%. The left ventricle has low normal function. The left ventricle has no regional wall motion abnormalities. Left ventricular diastolic parameters are consistent with Grade II diastolic dysfunction (pseudonormalization). Elevated left ventricular end-diastolic pressure. The E/e' is 21.  2. Right ventricular systolic function is normal. The right ventricular size is mildly enlarged. There is mildly elevated pulmonary artery systolic pressure. The estimated right ventricular systolic pressure is 54.6 mmHg.  3. Left atrial size was mildly dilated.  4. The mitral valve is abnormal. Trivial mitral valve regurgitation.  5. The aortic valve is tricuspid. Aortic valve regurgitation is not visualized.  6. The inferior vena cava is normal in size with <50% respiratory variability, suggesting right atrial pressure of 8 mmHg. Comparison(s): Changes from prior study are noted. 12/26/2021: LVEF 40-45%, RVSP 45.8 mmHg. FINDINGS  Left Ventricle: Left ventricular ejection fraction, by estimation, is 50 to 55%. The left ventricle has low normal function. The left ventricle has no regional wall motion abnormalities. The left ventricular internal cavity size was normal in size. There is no left ventricular hypertrophy. Left ventricular diastolic parameters are consistent with Grade II diastolic dysfunction (pseudonormalization). Elevated left ventricular end-diastolic pressure. The E/e' is 21. Right Ventricle: The right ventricular size is mildly enlarged. No increase in right ventricular wall thickness. Right ventricular systolic function is normal. There is mildly elevated pulmonary artery systolic pressure. The tricuspid regurgitant velocity is 2.75 m/s, and with an  assumed right atrial pressure of 8 mmHg, the estimated right ventricular  systolic pressure is 09.3 mmHg. Left Atrium: Left atrial size was mildly dilated. Right Atrium: Right atrial size was normal in size. Pericardium: There is no evidence of pericardial effusion. Mitral Valve: The mitral valve is abnormal. There is mild calcification of the posterior mitral valve leaflet(s). Trivial mitral valve regurgitation. Tricuspid Valve: The tricuspid valve is grossly normal. Tricuspid valve regurgitation is trivial. Aortic Valve: The aortic valve is tricuspid. Aortic valve regurgitation is not visualized. Pulmonic Valve: The pulmonic valve was normal in structure. Pulmonic valve regurgitation is not visualized. Aorta: The aortic root and ascending aorta are structurally normal, with no evidence of dilitation. Venous: The inferior vena cava is normal in size with less than 50% respiratory variability, suggesting right atrial pressure of 8 mmHg. IAS/Shunts: No atrial level shunt detected by color flow Doppler.  LEFT VENTRICLE PLAX 2D LVIDd:         4.50 cm     Diastology LVIDs:         3.20 cm     LV e' medial:    5.01 cm/s LV PW:         1.00 cm     LV E/e' medial:  21.2 LV IVS:        1.10 cm     LV e' lateral:   5.06 cm/s LVOT diam:     2.00 cm     LV E/e' lateral: 20.9 LV SV:         57 LV SV Index:   32 LVOT Area:     3.14 cm  LV Volumes (MOD) LV vol d, MOD A2C: 94.8 ml LV vol d, MOD A4C: 99.9 ml LV vol s, MOD A2C: 42.9 ml LV vol s, MOD A4C: 43.3 ml LV SV MOD A2C:     51.9 ml LV SV MOD A4C:     99.9 ml LV SV MOD BP:      55.3 ml RIGHT VENTRICLE RV S prime:     11.80 cm/s TAPSE (M-mode): 1.3 cm LEFT ATRIUM             Index        RIGHT ATRIUM           Index LA diam:        4.00 cm 2.26 cm/m   RA Area:     16.70 cm LA Vol (A2C):   62.2 ml 35.15 ml/m  RA Volume:   41.00 ml  23.17 ml/m LA Vol (A4C):   54.6 ml 30.85 ml/m LA Biplane Vol: 61.9 ml 34.98 ml/m  AORTIC VALVE LVOT Vmax:   85.40 cm/s LVOT Vmean:  55.400 cm/s  LVOT VTI:    0.181 m  AORTA Ao Root diam: 3.10 cm Ao Asc diam:  3.20 cm MITRAL VALVE                TRICUSPID VALVE MV Area (PHT): 2.99 cm     TR Peak grad:   30.2 mmHg MV Decel Time: 254 msec     TR Vmax:        275.00 cm/s MV E velocity: 106.00 cm/s MV A velocity: 36.20 cm/s   SHUNTS MV E/A ratio:  2.93         Systemic VTI:  0.18 m                             Systemic Diam: 2.00 cm Lyman Bishop MD Electronically signed by Lyman Bishop MD  Signature Date/Time: 05/20/2022/2:25:44 PM    Final     Cardiac Studies   Echo 05/20/22 1. Left ventricular ejection fraction, by estimation, is 50 to 55%. The  left ventricle has low normal function. The left ventricle has no regional  wall motion abnormalities. Left ventricular diastolic parameters are  consistent with Grade II diastolic  dysfunction (pseudonormalization). Elevated left ventricular end-diastolic  pressure. The E/e' is 21.   2. Right ventricular systolic function is normal. The right ventricular  size is mildly enlarged. There is mildly elevated pulmonary artery  systolic pressure. The estimated right ventricular systolic pressure is  17.6 mmHg.   3. Left atrial size was mildly dilated.   4. The mitral valve is abnormal. Trivial mitral valve regurgitation.   5. The aortic valve is tricuspid. Aortic valve regurgitation is not  visualized.   6. The inferior vena cava is normal in size with <50% respiratory  variability, suggesting right atrial pressure of 8 mmHg.   Comparison(s): Changes from prior study are noted. 12/26/2021: LVEF  40-45%, RVSP 45.8 mmHg.   Patient Profile     71 y.o. female with PMH paroxysmal atrial fibrillation, hypertension, type II diabetes, prior chronic systolic and diastolic heart failure, history of CVA, CKD stage 3b seen for acute on chronic systolic and diastolic heart failure  Assessment & Plan    Acute on chronic systolic and diastolic heart failure (current EF 50-55%), with previously reduced EF  (25-30% -> 40-45%) Pleural effusions, s/p thoracentesis Acute kidney injury on chronic kidney disease stage 3b Type II diabetes -admission weight 68 kg, suspect inaccurate as current weight listed as 92.7 kg and reported net negative 1L -Cr 2.79, above baseline today -suspect rising Cr may be due to congestion. She is volume up. Will trial IV lasix and monitor for clinical improvement and renal function improvement. -continue carvedilol -on amlodipine at home, on hold -continue hydralazine, imdur -when Cr improves, if GFR consistently >20 consider SGLT2i  Atrial fibrillation, paroxysmal S/P dual chamber PPM ([employer]) -CHA2DS2/VAS Stroke Risk Points=7  -continue apixaban -has chronic anemia, slightly below baseline, but no apparent active bleeding. [place] witness, would not want transfusion  For questions or updates, please contact [facility] Please consult [email or web address] for contact info under     Signed, Buford Dresser, MD  05/22/2022, 11:42 AM

## 2022-05-22 NOTE — Assessment & Plan Note (Signed)
-   Diagnosed with acute cholecystitis March 2022.  Underwent cholecystostomy tube placement on 03/19/2021 - Tube output had decreased on admission and underwent flushing with radiology with return of function -Continue flushing drain each shift and document output

## 2022-05-22 NOTE — Plan of Care (Signed)
  Problem: Activity: Goal: Capacity to carry out activities will improve Outcome: Progressing   Problem: Cardiac: Goal: Ability to achieve and maintain adequate cardiopulmonary perfusion will improve Outcome: Progressing   Problem: Clinical Measurements: Goal: Respiratory complications will improve Outcome: Progressing Goal: Cardiovascular complication will be avoided Outcome: Progressing   Problem: Nutrition: Goal: Adequate nutrition will be maintained Outcome: Progressing   Problem: Coping: Goal: Level of anxiety will decrease Outcome: Progressing   Problem: Pain Managment: Goal: General experience of comfort will improve Outcome: Progressing   Problem: Safety: Goal: Ability to remain free from injury will improve Outcome: Progressing

## 2022-05-22 NOTE — Assessment & Plan Note (Signed)
-   Moderate to large right-sided pleural effusion on admission along with worsening dyspnea/SOB, increased lower extremity edema and no improvement with home Lasix -Underwent thoracentesis on 5/24 with removal of 1.3 L -Initially Lasix held due to some dizziness and worsening renal function however lower extremity edema persists and renal function possibly plateau and component of CRS; Lasix restarted per cardiology on 05/22/2022 - Continue monitoring output and renal function

## 2022-05-22 NOTE — Assessment & Plan Note (Signed)
-   Due to underlying CHF - S/p right thoracentesis on 05/19/2022, removing 1.3 L

## 2022-05-23 ENCOUNTER — Inpatient Hospital Stay (HOSPITAL_COMMUNITY): Payer: Medicare HMO

## 2022-05-23 DIAGNOSIS — I5043 Acute on chronic combined systolic (congestive) and diastolic (congestive) heart failure: Secondary | ICD-10-CM

## 2022-05-23 DIAGNOSIS — N184 Chronic kidney disease, stage 4 (severe): Secondary | ICD-10-CM | POA: Diagnosis not present

## 2022-05-23 DIAGNOSIS — N179 Acute kidney failure, unspecified: Secondary | ICD-10-CM | POA: Diagnosis not present

## 2022-05-23 LAB — CBC WITH DIFFERENTIAL/PLATELET
Abs Immature Granulocytes: 0.02 10*3/uL (ref 0.00–0.07)
Basophils Absolute: 0 10*3/uL (ref 0.0–0.1)
Basophils Relative: 1 %
Eosinophils Absolute: 0.6 10*3/uL — ABNORMAL HIGH (ref 0.0–0.5)
Eosinophils Relative: 8 %
HCT: 34.8 % — ABNORMAL LOW (ref 36.0–46.0)
Hemoglobin: 11 g/dL — ABNORMAL LOW (ref 12.0–15.0)
Immature Granulocytes: 0 %
Lymphocytes Relative: 17 %
Lymphs Abs: 1.3 10*3/uL (ref 0.7–4.0)
MCH: 28.9 pg (ref 26.0–34.0)
MCHC: 31.6 g/dL (ref 30.0–36.0)
MCV: 91.3 fL (ref 80.0–100.0)
Monocytes Absolute: 0.6 10*3/uL (ref 0.1–1.0)
Monocytes Relative: 8 %
Neutro Abs: 4.9 10*3/uL (ref 1.7–7.7)
Neutrophils Relative %: 66 %
Platelets: 319 10*3/uL (ref 150–400)
RBC: 3.81 MIL/uL — ABNORMAL LOW (ref 3.87–5.11)
RDW: 15.6 % — ABNORMAL HIGH (ref 11.5–15.5)
WBC: 7.4 10*3/uL (ref 4.0–10.5)
nRBC: 0 % (ref 0.0–0.2)

## 2022-05-23 LAB — URINALYSIS, ROUTINE W REFLEX MICROSCOPIC
Bilirubin Urine: NEGATIVE
Glucose, UA: NEGATIVE mg/dL
Hgb urine dipstick: NEGATIVE
Ketones, ur: NEGATIVE mg/dL
Nitrite: NEGATIVE
Protein, ur: 100 mg/dL — AB
Specific Gravity, Urine: 1.006 (ref 1.005–1.030)
WBC, UA: >50 WBC/hpf — ABNORMAL HIGH (ref 0–5)
pH: 6 (ref 5.0–8.0)

## 2022-05-23 LAB — GLUCOSE, CAPILLARY
Glucose-Capillary: 191 mg/dL — ABNORMAL HIGH (ref 70–99)
Glucose-Capillary: 246 mg/dL — ABNORMAL HIGH (ref 70–99)
Glucose-Capillary: 219 mg/dL — ABNORMAL HIGH (ref 70–99)
Glucose-Capillary: 318 mg/dL — ABNORMAL HIGH (ref 70–99)

## 2022-05-23 LAB — BASIC METABOLIC PANEL
Anion gap: 7 (ref 5–15)
BUN: 30 mg/dL — ABNORMAL HIGH (ref 8–23)
CO2: 24 mmol/L (ref 22–32)
Calcium: 8.2 mg/dL — ABNORMAL LOW (ref 8.9–10.3)
Chloride: 105 mmol/L (ref 98–111)
Creatinine, Ser: 3 mg/dL — ABNORMAL HIGH (ref 0.44–1.00)
GFR, Estimated: 16 mL/min — ABNORMAL LOW (ref >60)
Glucose, Bld: 202 mg/dL — ABNORMAL HIGH (ref 70–99)
Potassium: 4.8 mmol/L (ref 3.5–5.1)
Sodium: 136 mmol/L (ref 135–145)

## 2022-05-23 LAB — MAGNESIUM: Magnesium: 2.6 mg/dL — ABNORMAL HIGH (ref 1.7–2.4)

## 2022-05-23 LAB — PROTEIN / CREATININE RATIO, URINE
Creatinine, Urine: 23.48 mg/dL
Protein Creatinine Ratio: 3.83 mg/mg{Cre} — ABNORMAL HIGH (ref 0.00–0.15)
Total Protein, Urine: 90 mg/dL

## 2022-05-23 IMAGING — DX DG CHEST 1V PORT
1 series · 1 of 1 positions shown · non-contrast
Comparison: Chest radiograph dated [DATE]

CLINICAL DATA: Shortness of breath

EXAM:
PORTABLE CHEST 1 VIEW

[chest ap]
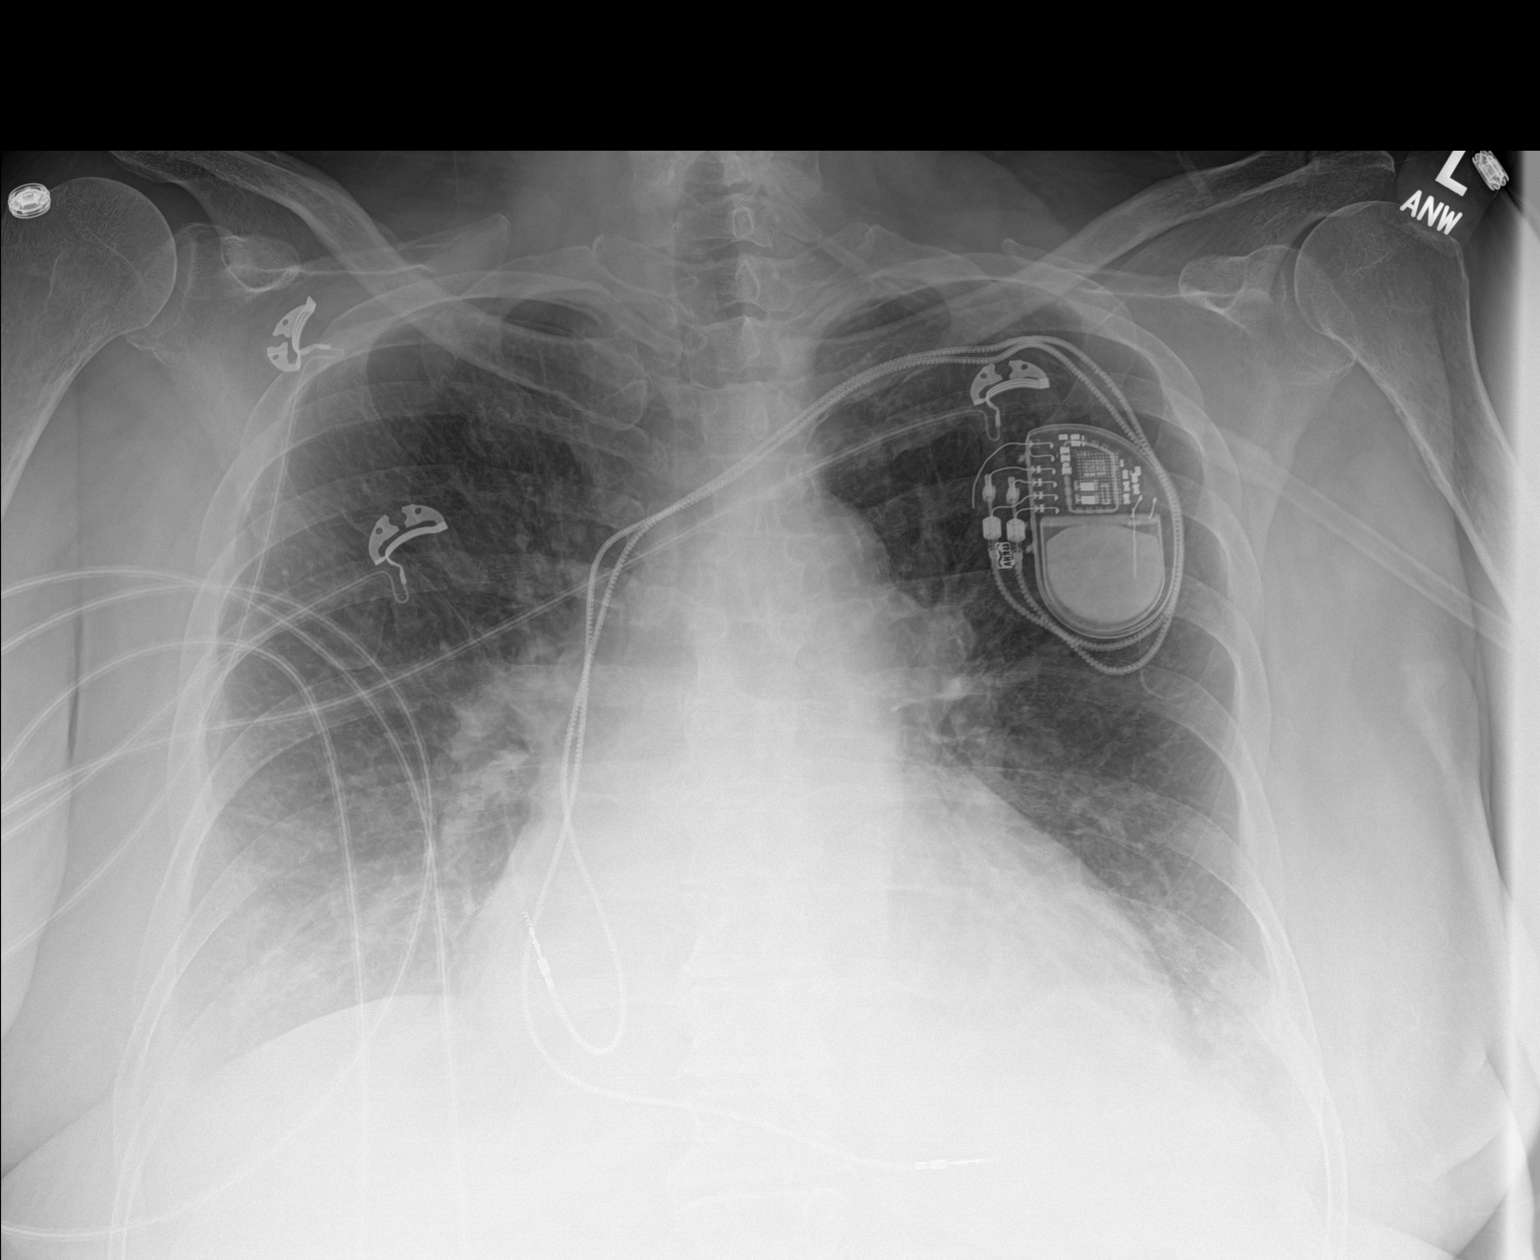

[1 of 1 positions shown; findings below may reference images not displayed]

FINDINGS: The heart is enlarged. Atherosclerotic calcification of the aortic
arch. Left access pacemaker leads are unchanged. Interval increase
in hazy opacities in bilateral lung bases suggesting pleural
effusions and/or atelectasis.
IMPRESSION: 1. Interval increase in bilateral lower lobe hazy opacities
suggesting pleural effusion and/or atelectasis.

2.  Cardiomediastinal silhouette is unchanged.

## 2022-05-23 MED ORDER — FUROSEMIDE 10 MG/ML IJ SOLN
80.0000 mg | Freq: Once | INTRAMUSCULAR | Status: AC
  Administered 2022-05-23: 80 mg via INTRAVENOUS
  Filled 2022-05-23: qty 8

## 2022-05-23 NOTE — Plan of Care (Signed)
  Problem: Clinical Measurements: Goal: Diagnostic test results will improve Outcome: Progressing Goal: Respiratory complications will improve Outcome: Progressing Goal: Cardiovascular complication will be avoided Outcome: Progressing   Problem: Activity: Goal: Risk for activity intolerance will decrease Outcome: Progressing   Problem: Pain Managment: Goal: General experience of comfort will improve Outcome: Progressing   Problem: Safety: Goal: Ability to remain free from injury will improve Outcome: Progressing   

## 2022-05-23 NOTE — Consult Note (Addendum)
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  She was admitted to the hospital 4 days ago with increasing lower extremity edema, cough, orthopnea and shortness of breath refractory to outpatient diuretic management.  She denies any preceding sputum production, fevers or chills.  She was determined to have acute exacerbation of congestive heart failure and was started on diuresis as well as underwent thoracentesis of 1.3 L of a right pleural effusion.  Concern raised with worsening renal function (creatinine has risen from 2.1-3.0 over the last 4 days) and relative refractoriness to diuretic therapy (550 cc urine output overnight after getting 40 mg IV furosemide yesterday).  She has not had any contrasted studies and urinalysis unavailable.  She denies any preceding NSAID use.  Past Medical History:  Diagnosis Date   Arthritis    Chronic systolic (congestive) heart failure (HCC)    Diabetes mellitus without complication (HCC)    DVT (deep venous thrombosis) ([place]) 07/26/2021   Hypertension    Paroxysmal atrial fibrillation ([place]) 07/26/2021   Stroke [facility])     Past Surgical History:  Procedure Laterality Date   breast tumor     IR CATHETER TUBE CHANGE  09/25/2021   IR CHOLANGIOGRAM EXISTING TUBE  05/13/2021   IR EXCHANGE BILIARY DRAIN  07/27/2021   IR EXCHANGE BILIARY DRAIN  11/04/2021   IR EXCHANGE BILIARY DRAIN  01/08/2022   IR RADIOLOGIST EVAL & MGMT  09/04/2021   IR REMOVAL OF CALCULI/DEBRIS BILIARY DUCT/GB  09/25/2021   IR REMOVAL OF CALCULI/DEBRIS BILIARY  DUCT/GB  11/04/2021   IR REMOVAL OF CALCULI/DEBRIS BILIARY DUCT/GB  01/08/2022   PACEMAKER IMPLANT     TONSILLECTOMY      Family History  Problem Relation Age of Onset   Cancer Mother    Diabetes Mother    Heart disease Father     Social History:  reports that she has never smoked. She has never used smokeless tobacco. She reports that she does not drink alcohol and does not use drugs.  Allergies: No Known Allergies  Medications: I have reviewed the patient's current medications. Scheduled:  apixaban  5 mg Oral BID   carvedilol  6.25 mg Oral BID WC   docusate sodium  100 mg Oral BID   feeding supplement (GLUCERNA SHAKE)  237 mL Oral TID BM   hydrALAZINE  50 mg Oral Q8H   insulin aspart  0-15 Units Subcutaneous TID WC   insulin aspart  0-5 Units Subcutaneous QHS   insulin glargine-yfgn  12 Units Subcutaneous Daily   isosorbide mononitrate  15 mg Oral Daily   multivitamin with minerals  1 tablet Oral Daily   sodium chloride flush  3 mL Intravenous Q12H       Latest Ref Rng & Units 05/23/2022    3:29 AM 05/22/2022    4:50 AM 05/21/2022    2:56 AM  BMP  Glucose 70 - 99 mg/dL 202   192   303    BUN 8 - 23 mg/dL '30   25   21    '$ Creatinine 0.44 - 1.00 mg/dL 3.00   2.79   2.70    Sodium 135 - 145 mmol/L  136   137   136    Potassium 3.5 - 5.1 mmol/L 4.8   4.3   4.3    Chloride 98 - 111 mmol/L 105   105   103    CO2 22 - 32 mmol/L '24   24   24    '$ Calcium 8.9 - 10.3 mg/dL 8.2   8.0   8.0        Latest Ref Rng & Units 05/23/2022    3:29 AM 05/22/2022    4:50 AM 05/21/2022    2:56 AM  CBC  WBC 4.0 - 10.5 K/uL 7.4   6.7   6.3    Hemoglobin 12.0 - 15.0 g/dL 11.0   10.3   10.4    Hematocrit 36.0 - 46.0 % 34.8   33.3   32.8    Platelets 150 - 400 K/uL 319   318   293      DG CHEST PORT 1 VIEW  Result Date: 05/23/2022 CLINICAL DATA:  Shortness of breath EXAM: PORTABLE CHEST 1 VIEW COMPARISON:  Chest radiograph dated May 19, 2022 FINDINGS: The heart is enlarged. Atherosclerotic  calcification of the aortic arch. Left access pacemaker leads are unchanged. Interval increase in hazy opacities in bilateral lung bases suggesting pleural effusions and/or atelectasis. IMPRESSION: 1. Interval increase in bilateral lower lobe hazy opacities suggesting pleural effusion and/or atelectasis. 2.  Cardiomediastinal silhouette is unchanged. Electronically Signed   By: Keane Police D.O.   On: 05/23/2022 10:18    Review of Systems  Constitutional:  Positive for chills and fatigue. Negative for fever.       Chronic cold intolerance  HENT:  Negative for nosebleeds and sore throat.   Eyes:  Negative for redness and visual disturbance.  Respiratory:  Positive for cough, chest tightness and shortness of breath. Negative for wheezing.   Cardiovascular:  Positive for leg swelling. Negative for chest pain and palpitations.  Gastrointestinal:  Positive for abdominal distention and nausea. Negative for diarrhea and vomiting.  Endocrine: Positive for cold intolerance. Negative for polydipsia and polyuria.  Genitourinary:  Negative for dysuria, frequency, hematuria and urgency.  Musculoskeletal:  Negative for arthralgias, back pain and myalgias.  Skin:  Negative for pallor and rash.  Neurological:  Positive for weakness. Negative for dizziness and headaches.  Blood pressure (!) 144/72, pulse 70, temperature 97.9 F (36.6 C), temperature source Oral, resp. rate 16, height '5\' 6"'$  (1.676 m), weight 93.5 kg, SpO2 96 %. Physical Exam Vitals reviewed.  Constitutional:      Appearance: She is obese. She is ill-appearing.  HENT:     Head: Normocephalic and atraumatic.     Mouth/Throat:     Mouth: Mucous membranes are moist.     Pharynx: Oropharynx is clear.  Eyes:     Extraocular Movements: Extraocular movements intact.  Neck:     Vascular: JVD present.     Comments: 11 to 12 cm JVP Cardiovascular:     Rate and Rhythm: Normal rate and regular rhythm.     Heart sounds: No murmur heard. Pulmonary:      Effort: Pulmonary effort is normal.     Breath sounds: Examination of the right-middle field reveals rales. Examination of the right-lower field reveals decreased breath sounds. Examination of the left-lower field reveals rales. Decreased breath sounds and rales present.  Abdominal:     General: Bowel sounds are normal.     Palpations: Abdomen is soft. There is no mass.  Musculoskeletal:  General: Normal range of motion.     Cervical back: Normal range of motion and neck supple.     Right lower leg: Edema present.     Left lower leg: Edema present.     Comments: 2+ bilateral lower extremity edema  Skin:    General: Skin is warm and dry.     Coloration: Skin is pale.  Neurological:     Mental Status: She is alert and oriented to person, place, and time.  Psychiatric:        Mood and Affect: Mood normal.    Assessment/Plan: 1.  Acute kidney injury on chronic kidney disease stage IIIb-IV: This appears to be likely hemodynamically mediated in the setting of acute exacerbation of systolic congestive heart failure.  I will send off for a urinalysis and order renal ultrasound to evaluate renal size and structure and to rule out any obstruction.  Urine electrolytes will be of low yield but will send off for a spot urine creatinine/BUN.  I agree with escalating diuretic dose at this time anticipating that we will see some transient worsening of renal function before we see improvement as her effective arterial blood volume is restored by cardiac unloading.  She does not have any acute electrolyte abnormalities or indications for dialysis at this time but I explained to her that if renal function continue to worsen and she had worsening respiratory symptoms from volume excess, she would need dialysis (she was agreeable to that). Avoid nephrotoxic medications including NSAIDs and iodinated intravenous contrast exposure unless the latter is absolutely indicated.  Preferred narcotic agents for  pain control are hydromorphone, fentanyl, and methadone. Morphine should not be used. Avoid Baclofen and avoid oral sodium phosphate and magnesium citrate based laxatives / bowel preps. Continue strict Input and Output monitoring. Will monitor the patient closely with you and intervene or adjust therapy as indicated by changes in clinical status/labs. 2.  Acute exacerbation of congestive heart failure: Poor diuretic response overnight however, so far there is at least 750 cc of urine in the canister by her bedside indicating that she has a better response today on the higher dose of furosemide 80 mg.  I will add an additional intravenous 80 mg furosemide this evening and follow I's/O's and recheck labs again tomorrow morning. 3.  Malnutrition/hypoalbuminemia: Whereas this may be an acute stress response to her recent health issues as well as chronic inflammatory reaction from chronic cholecystostomy tube, this raises concern for proteinuric renal disease/nephrotic syndrome in the setting of volume overload.  We will check spot urine protein/creatinine ratio to evaluate need for possible renal biopsy in the future (indeed may be from diabetic kidney disease). 4.  Anemia: Marginal hemoglobin and hematocrit likely secondary to malnutrition-inflammatory complex.  Continue to trend hemoglobin/hematocrit.   Carolee Channell K. 05/23/2022, 12:46 PM

## 2022-05-23 NOTE — PMR Pre-admission (Shared)
PMR Admission Coordinator Pre-Admission Assessment  Patient: Tracey Bryant is an 71 y.o., female MRN: [id number] DOB: October 17, 1951 Height: _0  (167.6 cm) Weight: 93.5 kg  Insurance Information HMO: ***    PPO: ***     PCP: ***     IPA: ***     80/20: ***     OTHER: *** PRIMARY: [employer] Medicare       Policy#: [id number]      Subscriber: Pt  CM Name: ***      Phone#: ***     Fax#: *** Pre-Cert#: ***      Employer: *** Benefits:  Phone #: ***     Name: *** Eff. Date: ***     Deduct: ***      Out of Pocket Max: ***      Life Max: *** CIR: ***      SNF: *** Outpatient: ***     Co-Pay: *** Home Health: ***      Co-Pay: *** DME: ***     Co-Pay: *** Providers: *** SECONDARY: ***      Policy#: ***     Phone#: ***  Financial Counselor: ***      Phone#: ***  The "Data Collection Information Summary" for patients in Inpatient Rehabilitation Facilities with attached "Privacy Act [place] Records" was provided and verbally reviewed with: Patient  Emergency Contact Information Contact Information     Name Relation Home Work Mobile   Bryant,Tracey Son   [phone]   Tracey Bryant Daughter [phone]         Current Medical History  Patient Admitting Diagnosis:  History of Present Illness: : Tracey Bryant Record is a 71 y.o. female with medical history significant of PAF; HTN; DM; chronic systolic CHF; and CVA who presented to the ED 05/19/22 with SOB. She reports that 2-3 weeks ago she was overloaded.  Her doctor increased her medications but it doesn't seem to be getting the fluid off.   Her weight is up - was 150 -> 204.  On admission Pt. Was positive for  L E edema, cough, orthopnea and fatigue.  In the ED  Placed on 2L O2 for comfort and given Lasix. Pt hypertensive in ED, given hydralazine.  Found to have acute CHF exacerbation and R pleural effusion s/p thoracentesis 5/24. PT and OT saw Pt. And recommended CIR to assist return to PLOF.       Patient's medical record from  [facility] has been reviewed by the rehabilitation admission coordinator and physician.  Past Medical History  Past Medical History:  Diagnosis Date   Arthritis    Chronic systolic (congestive) heart failure (HCC)    Diabetes mellitus without complication ([place])    DVT (deep venous thrombosis) ([place]) 07/26/2021   Hypertension    Paroxysmal atrial fibrillation ([place]) 07/26/2021   Stroke [facility])     Has the patient had major surgery during 100 days prior to admission? No  Family History   family history includes Cancer in her mother; Diabetes in her mother; Heart disease in her father.  Current Medications  Current Facility-Administered Medications:    acetaminophen (TYLENOL) tablet 650 mg, 650 mg, Oral, Q6H PRN, 650 mg at 05/22/22 2021 **OR** acetaminophen (TYLENOL) suppository 650 mg, 650 mg, Rectal, Q6H PRN, Karmen Bongo, MD   apixaban Arne [place]) tablet 5 mg, 5 mg, Oral, BID, Karmen Bongo, MD, 5 mg at 05/23/22 0912   bisacodyl (DULCOLAX) EC tablet 5 mg, 5 mg, Oral, Daily PRN, Karmen Bongo, MD  carvedilol (COREG) tablet 6.25 mg, 6.25 mg, Oral, BID WC, Karmen Bongo, MD, 6.25 mg at 05/23/22 0912   docusate sodium (COLACE) capsule 100 mg, 100 mg, Oral, BID, Karmen Bongo, MD, 100 mg at 05/23/22 0912   feeding supplement (GLUCERNA SHAKE) (GLUCERNA SHAKE) liquid 237 mL, 237 mL, Oral, TID BM, Vann, Jessica U, DO, 237 mL at 05/23/22 0913   hydrALAZINE (APRESOLINE) injection 5 mg, 5 mg, Intravenous, Q4H PRN, Karmen Bongo, MD   hydrALAZINE (APRESOLINE) tablet 50 mg, 50 mg, Oral, Q8H, Irish Lack, Jayadeep S, MD, 50 mg at 05/23/22 0559   insulin aspart (novoLOG) injection 0-15 Units, 0-15 Units, Subcutaneous, TID WC, Karmen Bongo, MD, 3 Units at 05/23/22 0559   insulin aspart (novoLOG) injection 0-5 Units, 0-5 Units, Subcutaneous, QHS, Karmen Bongo, MD, 2 Units at 05/22/22 2200   insulin glargine-yfgn (SEMGLEE) injection 12 Units, 12 Units, Subcutaneous, Daily,  Geradine Girt, DO, 12 Units at 05/23/22 0914   isosorbide mononitrate (IMDUR) 24 hr tablet 15 mg, 15 mg, Oral, Daily, Karmen Bongo, MD, 15 mg at 05/23/22 2376   morphine (PF) 2 MG/ML injection 2 mg, 2 mg, Intravenous, Q2H PRN, Karmen Bongo, MD   multivitamin with minerals tablet 1 tablet, 1 tablet, Oral, Daily, Vann, Jessica U, DO, 1 tablet at 05/23/22 0912   ondansetron (ZOFRAN) tablet 4 mg, 4 mg, Oral, Q6H PRN, 4 mg at 05/22/22 2142 **OR** ondansetron (ZOFRAN) injection 4 mg, 4 mg, Intravenous, Q6H PRN, Karmen Bongo, MD, 4 mg at 05/21/22 2351   oxyCODONE (Oxy IR/ROXICODONE) immediate release tablet 5 mg, 5 mg, Oral, Q4H PRN, Karmen Bongo, MD   polyethylene glycol (MIRALAX / GLYCOLAX) packet 17 g, 17 g, Oral, Daily PRN, Karmen Bongo, MD   sodium chloride flush (NS) 0.9 % injection 3 mL, 3 mL, Intravenous, Q12H, Karmen Bongo, MD, 3 mL at 05/23/22 0913   traZODone (DESYREL) tablet 25 mg, 25 mg, Oral, QHS PRN, Karmen Bongo, MD, 25 mg at 05/22/22 2200  Patients Current Diet:  Diet Order             Diet heart healthy/carb modified Room service appropriate? Yes; Fluid consistency: Thin; Fluid restriction: 1500 mL Fluid  Diet effective now                   Precautions / Restrictions Precautions Precautions: Fall Precaution Comments: poor endurance and awareness of deficits Restrictions Weight Bearing Restrictions: No   Has the patient had 2 or more falls or a fall with injury in the past year? Yes  Prior Activity Level Community (5-7x/wk): Pt. was active in the community PTA  Prior Functional Level Self Care: Did the patient need help bathing, dressing, using the toilet or eating? Independent  Indoor Mobility: Did the patient need assistance with walking from room to room (with or without device)? Independent  Stairs: Did the patient need assistance with internal or external stairs (with or without device)? Independent  Functional Cognition: Did the  patient need help planning regular tasks such as shopping or remembering to take medications? Independent  Patient Information Are you of Hispanic, Latino/a,or Spanish origin?: A. No, not of Hispanic, Latino/a, or Spanish origin What is your race?: A. White Do you need or want an interpreter to communicate with a doctor or health care staff?: 0. No  Patient's Response To:  Health Literacy and Transportation Is the patient able to respond to health literacy and transportation needs?: Yes Health Literacy - How often do you need to have someone help you when  you read instructions, pamphlets, or other written material from your doctor or pharmacy?: Never In the past 12 months, has lack of transportation kept you from medical appointments or from getting medications?: No In the past 12 months, has lack of transportation kept you from meetings, work, or from getting things needed for daily living?: No  Development worker, international aid / Equipment Home Equipment: Rollator (4 wheels), Shower seat, BSC/3in1, Grab bars - tub/shower, Conservation officer, nature (2 wheels), [employer] - single point  Prior Device Use: Indicate devices/aids used by the patient prior to current illness, exacerbation or injury? Walker  Current Functional Level Cognition  Overall Cognitive Status: Impaired/Different from baseline Orientation Level: Oriented X4 Safety/Judgement: Decreased awareness of safety, Decreased awareness of deficits General Comments: pt motivated, but needing cues for maintaining LE straight when standing, max cues for safety, pt seems to perform more poorly when anxious, responds well to encouragement and distraction. able to follwo all cues for technique    Extremity Assessment (includes Sensation/Coordination)  Upper Extremity Assessment: Generalized weakness, Overall WFL for tasks assessed  Lower Extremity Assessment: Defer to PT evaluation    ADLs  Overall ADL's : Needs assistance/impaired Eating/Feeding: Set up,  Sitting Grooming: Wash/dry hands, Wash/dry face, Oral care, Set up, Sitting Grooming Details (indicate cue type and reason): at sink, did not perform in standing due to knees buckling Upper Body Bathing: Modified independent, Set up, Sitting Upper Body Bathing Details (indicate cue type and reason): seated at sink Lower Body Bathing: Sit to/from stand, Sitting/lateral leans, Moderate assistance Upper Body Dressing : Set up, Sitting Lower Body Dressing: Moderate assistance, Sitting/lateral leans, Sit to/from stand Toilet Transfer: Minimal assistance, Moderate assistance, Cueing for safety, Ambulation, Cueing for sequencing, Rolling walker (2 wheels) Toilet Transfer Details (indicate cue type and reason): Simulated ambulating on room from EOB, around bed and to recliner chair Toileting- Clothing Manipulation and Hygiene: Sitting/lateral lean, Sit to/from stand, Minimal assistance, Moderate assistance Functional mobility during ADLs: Minimal assistance, Rolling walker (2 wheels), Cueing for safety, Cueing for sequencing General ADL Comments: patient performed self care tasks seated at sink due to knees buckling.    Mobility  Overal bed mobility: Needs Assistance Bed Mobility: Sit to Supine Supine to sit: Min guard, HOB elevated Sit to supine: Min assist General bed mobility comments: minA to BLE to return them to bed    Transfers  Overall transfer level: Needs assistance Equipment used: Ambulation equipment used Transfers: Sit to/from Stand, Bed to chair/wheelchair/BSC Sit to Stand: Min assist Bed to/from chair/wheelchair/BSC transfer type:: Via Lift equipment (stedy) Step pivot transfers: Min assist Transfer via Lift Equipment: Stedy General transfer comment: minA of 2 to rise in stedy, pt cued not to use RUE due to pain with shoulder flexion. Pt needing instruction to complete transfer, but once putting in effort, able to rise to standing with minA. mild instability with initial stand but  no knee buckling at this time. VSS with stance    Ambulation / Gait / Stairs / Wheelchair Mobility  Ambulation/Gait Ambulation/Gait assistance: Min guard, Min assist Gait Distance (Feet): 20 Feet Assistive device: Rolling walker (2 wheels) Gait Pattern/deviations: Step-to pattern, Decreased stride length, Knees buckling General Gait Details: used stedy to transfer from chair to bed, then pre-gait activities from stedy Gait velocity: decreased Gait velocity interpretation: <1.31 ft/sec, indicative of household ambulator Pre-gait activities: standing marches in stedy. up to modA to steady while pt wt-shifting, increased time to lift each foot with jerking movements in lifted leg. x5 each side  Posture / Balance Dynamic Sitting Balance Sitting balance - Comments: very limited, pt fatigues quickly and collapses back against recliner. Balance Overall balance assessment: Needs assistance Sitting-balance support: Single extremity supported, No upper extremity supported, Feet supported Sitting balance-Leahy Scale: Fair Sitting balance - Comments: very limited, pt fatigues quickly and collapses back against recliner. Standing balance support: Bilateral upper extremity supported, Reliant on assistive device for balance Standing balance-Leahy Scale: Poor Standing balance comment: Reliant on RW for balance with up to [place] needs/care consideration Special service needs ***   Previous Home Environment (from acute therapy documentation) Living Arrangements: Alone  Lives With: Alone Available Help at Discharge: Family, Available 24 hours/day Type of Home: House Home Layout: One level Home Access: Stairs to enter Entrance Stairs-Rails: Can reach both, Right, Left Entrance Stairs-Number of Steps: 3 Bathroom Shower/Tub: Government social research officer Accessibility: Yes Additional Comments: Patient daughter lives next door and she and her teenage children assist  PRN  Discharge Living Setting Plans for Discharge Living Setting: Mobile Home Type of Home at Discharge: Mobile home Discharge Home Layout: One level Discharge Home Access: Stairs to enter Entrance Stairs-Rails: Can reach both, Left, Right Entrance Stairs-Number of Steps: 3 Discharge Bathroom Shower/Tub: Tub/shower unit Discharge Bathroom Toilet: Standard Discharge Bathroom Accessibility: Yes How Accessible: Accessible via walker  Social/Family/Support Systems Patient Roles: Other (Comment) Contact Information: [phone] Anticipated Caregiver: Tracey Bryant (daughter) Anticipated Caregiver's Contact Information: Can provide 24/7 min A Caregiver Availability: 24/7 Discharge Plan Discussed with Primary Caregiver: Yes Is Caregiver In Agreement with Plan?: Yes  Goals Patient/Family Goal for Rehab: PT/OT Supervision Expected length of stay: 5-7 days Pt/Family Agrees to Admission and willing to participate: Yes Program Orientation Provided & Reviewed with Pt/Caregiver Including Roles  & Responsibilities: Yes  Decrease burden of Care through IP rehab admission: Specialzed equipment needs, Decrease number of caregivers, Bowel and bladder program, and Patient/family education  Possible need for SNF placement upon discharge: not anticipated  Patient Condition: I have reviewed medical records from [facility] , spoken with CM, and patient and daughter. I met with patient at the bedside for inpatient rehabilitation assessment.  Patient will benefit from ongoing PT and OT, can actively participate in 3 hours of therapy a day 5 days of the week, and can make measurable gains during the admission.  Patient will also benefit from the coordinated team approach during an Inpatient Acute Rehabilitation admission.  The patient will receive intensive therapy as well as Rehabilitation physician, nursing, social worker, and care management interventions.  Due to safety, disease  management, medication administration, pain management, and patient education the patient requires 24 hour a day rehabilitation nursing.  The patient is currently *** with mobility and basic ADLs.  Discharge setting and therapy post discharge at home with home health is anticipated.  Patient has agreed to participate in the Acute Inpatient Rehabilitation Program and will admit {Time; today/tomorrow:10263}.  Preadmission Screen Completed By:  Genella Mech, 05/23/2022 11:46 AM ______________________________________________________________________   Discussed status with Dr. Marland Kitchen on *** at *** and received approval for admission today.  Admission Coordinator:  Genella Mech, CCC-SLP, time Marland KitchenSudie Grumbling ***   Assessment/Plan: Diagnosis: Does the need for close, 24 hr/day Medical supervision in concert with the patient's rehab needs make it unreasonable for this patient to be served in a less intensive setting? {yes_no_potentially:3041433} Co-Morbidities requiring supervision/potential complications: *** Due to {due UX:8333832}, does the patient require 24 hr/day rehab nursing? {yes_no_potentially:3041433} Does the patient require  coordinated care of a physician, rehab nurse, PT, OT, and SLP to address physical and functional deficits in the context of the above medical diagnosis(es)? {yes_no_potentially:3041433} Addressing deficits in the following areas: {deficits:3041436} Can the patient actively participate in an intensive therapy program of at least 3 hrs of therapy 5 days a week? {yes_no_potentially:3041433} The potential for patient to make measurable gains while on inpatient rehab is {potential:3041437} Anticipated functional outcomes upon discharge from inpatient rehab: {functional outcomes:304600100} PT, {functional outcomes:304600100} OT, {functional outcomes:304600100} SLP Estimated rehab length of stay to reach the above functional goals is: *** Anticipated discharge destination: {anticipated  dc setting:21604} 10. Overall Rehab/Functional Prognosis: {potential:3041437}   MD Signature: ***

## 2022-05-23 NOTE — Progress Notes (Signed)
Progress Note    Tracey Bryant   SJG:[id number]  DOB: 13-Oct-1951  DOA: 05/19/2022     3 PCP: Coolidge Breeze, FNP  Initial CC: Shortness of breath  Hospital Course: Tracey Bryant is a 71 y.o. female with PMH cholecystitis with cholecystostomy tube in place (initially placed 03/19/2021), afib, HTN, DMII, sCHF, CVA who presented with shortness of breath.  She also had reported increased lower extremity swelling/overload with no improvement on her home Lasix. She underwent thoracentesis on 05/19/2022 removing 1.3 L.  Initially Lasix was held after developing some dizziness. Respiratory status improved after thoracentesis.    She also was evaluated by IR due to difficulty with cholecystostomy tube drainage.  Tube was evaluated and noted intact however suture had been removed but tube had not been retracted any.  After forceful flushing, biliary output was achieved with no further issues.   Interval History:  Seen this morning with improvement in her breathing since admission.  Not on oxygen at home typically.  Abdomen still sore which is not uncommon for her and biliary drain now with better output after flushing with radiology.  Assessment and Plan: * Acute on chronic combined systolic and diastolic congestive heart failure (HCC) - Moderate to large right-sided pleural effusion on admission along with worsening dyspnea/SOB, increased lower extremity edema and no improvement with home Lasix -Underwent thoracentesis on 5/24 with removal of 1.3 L -Initially Lasix held due to some dizziness and worsening renal function however lower extremity edema persisted and lasix restarted 5/27 - this morning more SOB especially describing worsening orthopnea; CXR obtained and shows increased pulmonary edema as well - lasix dose increased x 1 this am per cardiology and nephrology consulted for worsened renal fxn - continue strict I&O  Acute renal failure superimposed on stage 4 chronic kidney disease  ([place]) - patient has history of CKD4. Baseline creat ~ 1.6 - 1.9, eGFR 25 -Lasix restarted on 05/22/2022 - Etiology possibly due to CRS from underlying CHF; ATN also a possibility from hypoxia on admission - bladder scan showed 190 cc this am; she's voiding okay at this time - nephrology consulted given worsening renal function today after lasix restarted 5/27 - continue monitoring output  Pleural effusion due to CHF (congestive heart failure) (HCC)-resolved as of 05/22/2022 - Due to underlying CHF - S/p right thoracentesis on 05/19/2022, removing 1.3 L  Acute respiratory failure with hypoxia (HCC) - Due to underlying pleural effusion initially on admission and volume overload - Continue Lasix - Wean oxygen off as able.  Not on home O2 - CXR shows worsening edema on 5/28  Cholecystitis, chronic - Diagnosed with acute cholecystitis March 2022.  Underwent cholecystostomy tube placement on 03/19/2021 - Tube output had decreased on admission and underwent flushing with radiology with return of function -Continue flushing drain each shift and document output  Physical deconditioning - Evaluated by PT/OT - Recommendations are for acute inpatient rehab if approved - Follow-up pending CIR evaluation  History of CVA (cerebrovascular accident) - Continue Eliquis  Paroxysmal atrial fibrillation ([place]) - Continue Coreg and Eliquis  Uncontrolled type 2 diabetes mellitus with hyperglycemia, with long-term current use of insulin (HCC) - Last A1c 7.7% - Continue SSI and CBG monitoring  Hypertension - Continue Coreg, hydralazine, Imdur    Old records reviewed in assessment of this patient  Antimicrobials:   DVT prophylaxis:   apixaban (ELIQUIS) tablet 5 mg   Code Status:   Code Status: Full Code  Disposition Plan: Pending CIR evaluation Status  is: Inpatient  Objective: Blood pressure (!) 144/72, pulse 70, temperature 97.9 F (36.6 C), temperature source Oral, resp. rate 16, height  $Remov'5\' 6"'YHSaQt$  (1.676 m), weight 93.5 kg, SpO2 96 %.  Examination:  Physical Exam Constitutional:      General: She is not in acute distress.    Appearance: She is well-developed.  HENT:     Head: Normocephalic and atraumatic.     Mouth/Throat:     Mouth: Mucous membranes are moist.  Eyes:     Extraocular Movements: Extraocular movements intact.  Cardiovascular:     Rate and Rhythm: Normal rate and regular rhythm.  Pulmonary:     Comments: More dull breath sounds over right lower lung fields; no wheezing Abdominal:     General: Bowel sounds are normal. There is no distension.     Palpations: Abdomen is soft.     Comments: Expected tenderness to palpation around cholecystostomy tube which is chronic.  Biliary fluid appreciated in bag  Musculoskeletal:        General: Swelling present.     Cervical back: Normal range of motion and neck supple.     Comments: 2+ bilateral lower extremity pitting edema  Skin:    General: Skin is warm and dry.  Neurological:     General: No focal deficit present.     Mental Status: She is alert.  Psychiatric:        Mood and Affect: Mood normal.        Behavior: Behavior normal.     Consultants:  Cardiology IR  Procedures:  Right thoracentesis, 05/19/2022 Biliary drain flushing per IR, 05/21/2022  Data Reviewed: Results for orders placed or performed during the hospital encounter of 05/19/22 (from the past 24 hour(s))  Glucose, capillary     Status: Abnormal   Collection Time: 05/22/22  4:50 PM  Result Value Ref Range   Glucose-Capillary 252 (H) 70 - 99 mg/dL  Glucose, capillary     Status: Abnormal   Collection Time: 05/22/22  9:16 PM  Result Value Ref Range   Glucose-Capillary 220 (H) 70 - 99 mg/dL   Comment 1 Notify RN    Comment 2 Document in Chart   Basic metabolic panel     Status: Abnormal   Collection Time: 05/23/22  3:29 AM  Result Value Ref Range   Sodium 136 135 - 145 mmol/L   Potassium 4.8 3.5 - 5.1 mmol/L   Chloride 105 98 - 111  mmol/L   CO2 24 22 - 32 mmol/L   Glucose, Bld 202 (H) 70 - 99 mg/dL   BUN 30 (H) 8 - 23 mg/dL   Creatinine, Ser 3.00 (H) 0.44 - 1.00 mg/dL   Calcium 8.2 (L) 8.9 - 10.3 mg/dL   GFR, Estimated 16 (L) >60 mL/min   Anion gap 7 5 - 15  CBC with Differential/Platelet     Status: Abnormal   Collection Time: 05/23/22  3:29 AM  Result Value Ref Range   WBC 7.4 4.0 - 10.5 K/uL   RBC 3.81 (L) 3.87 - 5.11 MIL/uL   Hemoglobin 11.0 (L) 12.0 - 15.0 g/dL   HCT 34.8 (L) 36.0 - 46.0 %   MCV 91.3 80.0 - 100.0 fL   MCH 28.9 26.0 - 34.0 pg   MCHC 31.6 30.0 - 36.0 g/dL   RDW 15.6 (H) 11.5 - 15.5 %   Platelets 319 150 - 400 K/uL   nRBC 0.0 0.0 - 0.2 %   Neutrophils Relative % 66 %  Neutro Abs 4.9 1.7 - 7.7 K/uL   Lymphocytes Relative 17 %   Lymphs Abs 1.3 0.7 - 4.0 K/uL   Monocytes Relative 8 %   Monocytes Absolute 0.6 0.1 - 1.0 K/uL   Eosinophils Relative 8 %   Eosinophils Absolute 0.6 (H) 0.0 - 0.5 K/uL   Basophils Relative 1 %   Basophils Absolute 0.0 0.0 - 0.1 K/uL   Immature Granulocytes 0 %   Abs Immature Granulocytes 0.02 0.00 - 0.07 K/uL  Magnesium     Status: Abnormal   Collection Time: 05/23/22  3:29 AM  Result Value Ref Range   Magnesium 2.6 (H) 1.7 - 2.4 mg/dL  Glucose, capillary     Status: Abnormal   Collection Time: 05/23/22  5:47 AM  Result Value Ref Range   Glucose-Capillary 191 (H) 70 - 99 mg/dL  Glucose, capillary     Status: Abnormal   Collection Time: 05/23/22 11:02 AM  Result Value Ref Range   Glucose-Capillary 246 (H) 70 - 99 mg/dL    I have Reviewed nursing notes, Vitals, and Lab results since pt's last encounter. Pertinent lab results : see above I have ordered test including BMP, CBC, Mg I have reviewed the last note from staff over past 24 hours I have discussed pt's care plan and test results with nursing staff, case manager   LOS: 3 days   Dwyane Dee, MD [facility] 05/23/2022, 12:19 PM

## 2022-05-23 NOTE — Progress Notes (Signed)
Progress Note  Patient Name: Tracey Bryant Date of Encounter: 05/23/2022  [facility] Cardiologist: Minus Breeding, MD   Subjective   Feels overall fatigued/weak after minimal activity. Did not make much urine on furosemide 40 mg IV yesterday, continues to feel short of breath.   Inpatient Medications    Scheduled Meds:  apixaban  5 mg Oral BID   carvedilol  6.25 mg Oral BID WC   docusate sodium  100 mg Oral BID   feeding supplement (GLUCERNA SHAKE)  237 mL Oral TID BM   hydrALAZINE  50 mg Oral Q8H   insulin aspart  0-15 Units Subcutaneous TID WC   insulin aspart  0-5 Units Subcutaneous QHS   insulin glargine-yfgn  12 Units Subcutaneous Daily   isosorbide mononitrate  15 mg Oral Daily   multivitamin with minerals  1 tablet Oral Daily   sodium chloride flush  3 mL Intravenous Q12H   Continuous Infusions:  PRN Meds: acetaminophen **OR** acetaminophen, bisacodyl, hydrALAZINE, morphine injection, ondansetron **OR** ondansetron (ZOFRAN) IV, oxyCODONE, polyethylene glycol, traZODone   Vital Signs    Vitals:   05/22/22 2011 05/23/22 0400 05/23/22 0723 05/23/22 1104  BP: (!) 115/59 (!) 147/70 121/61 (!) 144/72  Pulse: 65 61 61 70  Resp: '16 15 13 16  '$ Temp: 98.5 F (36.9 C) 98.5 F (36.9 C) 97.9 F (36.6 C)   TempSrc: Oral Oral Oral   SpO2: 95% 96% 96% 96%  Weight:  93.5 kg    Height:        Intake/Output Summary (Last 24 hours) at 05/23/2022 1212 Last data filed at 05/23/2022 0900 Gross per 24 hour  Intake 1104 ml  Output 1325 ml  Net -221 ml      05/23/2022    4:00 AM 05/22/2022    4:58 AM 05/21/2022    6:23 AM  Last 3 Weights  Weight (lbs) 206 lb 2.1 oz 204 lb 5.9 oz 201 lb 15.1 oz  Weight (kg) 93.5 kg 92.7 kg 91.6 kg      Telemetry    Predominantly a paced/v sensed with intermittent sinus beats - Personally Reviewed  ECG    No new since 5/24 - Personally Reviewed  Physical Exam   GEN: Well nourished, well developed in no acute distress NECK:  JVD low neck at almost 90 degrees CARDIAC: regular rhythm, normal S1 and S2, no rubs or gallops. No murmur. VASCULAR: Radial pulses 2+ bilaterally.  RESPIRATORY:  Clear to auscultation in upper fields, diminished slightly at bases ABDOMEN: Soft, non-tender, non-distended MUSCULOSKELETAL:  Moves all 4 limbs independently SKIN: Warm and dry, bilateral 2+ firm edema NEUROLOGIC:  No focal neuro deficits noted. PSYCHIATRIC:  Normal affect    Labs    High Sensitivity Troponin:  No results for input(s): TROPONINIHS in the last 720 hours.   Chemistry Recent Labs  Lab 05/20/22 0438 05/21/22 0256 05/22/22 0450 05/23/22 0329  NA 137 136 137 136  K 4.1 4.3 4.3 4.8  CL 107 103 105 105  CO2 '23 24 24 24  '$ GLUCOSE 250* 303* 192* 202*  BUN 19 21 25* 30*  CREATININE 2.37* 2.70* 2.79* 3.00*  CALCIUM 8.3* 8.0* 8.0* 8.2*  MG 1.8  --   --  2.6*  PROT  --  5.3*  --   --   ALBUMIN  --  1.9*  --   --   AST  --  11*  --   --   ALT  --  9  --   --  ALKPHOS  --  56  --   --   BILITOT  --  0.5  --   --   GFRNONAA 22* 18* 18* 16*  ANIONGAP '7 9 8 7    '$ Lipids No results for input(s): CHOL, TRIG, HDL, LABVLDL, LDLCALC, CHOLHDL in the last 168 hours.  Hematology Recent Labs  Lab 05/21/22 0256 05/22/22 0450 05/23/22 0329  WBC 6.3 6.7 7.4  RBC 3.64* 3.64* 3.81*  HGB 10.4* 10.3* 11.0*  HCT 32.8* 33.3* 34.8*  MCV 90.1 91.5 91.3  MCH 28.6 28.3 28.9  MCHC 31.7 30.9 31.6  RDW 15.3 15.4 15.6*  PLT 293 318 319   Thyroid No results for input(s): TSH, FREET4 in the last 168 hours.  BNP Recent Labs  Lab 05/19/22 1040  BNP 2,083.9*    DDimer No results for input(s): DDIMER in the last 168 hours.   Radiology    DG CHEST PORT 1 VIEW  Result Date: 05/23/2022 CLINICAL DATA:  Shortness of breath EXAM: PORTABLE CHEST 1 VIEW COMPARISON:  Chest radiograph dated May 19, 2022 FINDINGS: The heart is enlarged. Atherosclerotic calcification of the aortic arch. Left access pacemaker leads are unchanged.  Interval increase in hazy opacities in bilateral lung bases suggesting pleural effusions and/or atelectasis. IMPRESSION: 1. Interval increase in bilateral lower lobe hazy opacities suggesting pleural effusion and/or atelectasis. 2.  Cardiomediastinal silhouette is unchanged. Electronically Signed   By: Keane Police D.O.   On: 05/23/2022 10:18    Cardiac Studies   Echo 05/20/22 1. Left ventricular ejection fraction, by estimation, is 50 to 55%. The  left ventricle has low normal function. The left ventricle has no regional  wall motion abnormalities. Left ventricular diastolic parameters are  consistent with Grade II diastolic  dysfunction (pseudonormalization). Elevated left ventricular end-diastolic  pressure. The E/e' is 21.   2. Right ventricular systolic function is normal. The right ventricular  size is mildly enlarged. There is mildly elevated pulmonary artery  systolic pressure. The estimated right ventricular systolic pressure is  19.6 mmHg.   3. Left atrial size was mildly dilated.   4. The mitral valve is abnormal. Trivial mitral valve regurgitation.   5. The aortic valve is tricuspid. Aortic valve regurgitation is not  visualized.   6. The inferior vena cava is normal in size with <50% respiratory  variability, suggesting right atrial pressure of 8 mmHg.   Comparison(s): Changes from prior study are noted. 12/26/2021: LVEF  40-45%, RVSP 45.8 mmHg.   Patient Profile     71 y.o. female with PMH paroxysmal atrial fibrillation, hypertension, type II diabetes, prior chronic systolic and diastolic heart failure, history of CVA, CKD stage 3b seen for acute on chronic systolic and diastolic heart failure  Assessment & Plan    Acute on chronic systolic and diastolic heart failure (current EF 50-55%), with previously reduced EF (25-30% -> 40-45%) Pleural effusions, s/p thoracentesis Acute kidney injury on chronic kidney disease stage 3b Type II diabetes -admission weight 68 kg,  suspect inaccurate as current weight listed as 93.5kg and reported net negative 1L. Weight has risen since admission -Cr 3.00, rising -did not make significant urine on 40 IV BID lasix yesterday, weight up and Cr up. Will trial 80 mg IV, but would recommend getting nephrology on board for further assistance. -continue carvedilol -on amlodipine at home, on hold -continue hydralazine, imdur -when Cr improves, if GFR consistently >20 consider SGLT2i  Atrial fibrillation, paroxysmal S/P dual chamber PPM ([employer]) -CHA2DS2/VAS Stroke Risk Points=7  -continue  apixaban -has chronic anemia, slightly below baseline, but no apparent active bleeding. Denning witness, would not want transfusion  For questions or updates, please contact [facility] Please consult [email or web address] for contact info under     Signed, Buford Dresser, MD  05/23/2022, 12:12 PM

## 2022-05-24 DIAGNOSIS — N179 Acute kidney failure, unspecified: Secondary | ICD-10-CM | POA: Diagnosis not present

## 2022-05-24 DIAGNOSIS — J9601 Acute respiratory failure with hypoxia: Secondary | ICD-10-CM | POA: Diagnosis not present

## 2022-05-24 DIAGNOSIS — I5043 Acute on chronic combined systolic (congestive) and diastolic (congestive) heart failure: Secondary | ICD-10-CM | POA: Diagnosis not present

## 2022-05-24 DIAGNOSIS — K811 Chronic cholecystitis: Secondary | ICD-10-CM | POA: Diagnosis not present

## 2022-05-24 LAB — RENAL FUNCTION PANEL
Albumin: 2.1 g/dL — ABNORMAL LOW (ref 3.5–5.0)
Anion gap: 9 (ref 5–15)
BUN: 34 mg/dL — ABNORMAL HIGH (ref 8–23)
CO2: 25 mmol/L (ref 22–32)
Calcium: 8.2 mg/dL — ABNORMAL LOW (ref 8.9–10.3)
Chloride: 101 mmol/L (ref 98–111)
Creatinine, Ser: 3.19 mg/dL — ABNORMAL HIGH (ref 0.44–1.00)
GFR, Estimated: 15 mL/min — ABNORMAL LOW (ref >60)
Glucose, Bld: 230 mg/dL — ABNORMAL HIGH (ref 70–99)
Phosphorus: 4.4 mg/dL (ref 2.5–4.6)
Potassium: 4.6 mmol/L (ref 3.5–5.1)
Sodium: 135 mmol/L (ref 135–145)

## 2022-05-24 LAB — CBC WITH DIFFERENTIAL/PLATELET
Abs Immature Granulocytes: 0.01 10*3/uL (ref 0.00–0.07)
Basophils Absolute: 0 10*3/uL (ref 0.0–0.1)
Basophils Relative: 1 %
Eosinophils Absolute: 0.5 10*3/uL (ref 0.0–0.5)
Eosinophils Relative: 7 %
HCT: 33.3 % — ABNORMAL LOW (ref 36.0–46.0)
Hemoglobin: 10.8 g/dL — ABNORMAL LOW (ref 12.0–15.0)
Immature Granulocytes: 0 %
Lymphocytes Relative: 17 %
Lymphs Abs: 1.1 10*3/uL (ref 0.7–4.0)
MCH: 29.2 pg (ref 26.0–34.0)
MCHC: 32.4 g/dL (ref 30.0–36.0)
MCV: 90 fL (ref 80.0–100.0)
Monocytes Absolute: 0.6 10*3/uL (ref 0.1–1.0)
Monocytes Relative: 8 %
Neutro Abs: 4.5 10*3/uL (ref 1.7–7.7)
Neutrophils Relative %: 67 %
Platelets: 327 10*3/uL (ref 150–400)
RBC: 3.7 MIL/uL — ABNORMAL LOW (ref 3.87–5.11)
RDW: 15.6 % — ABNORMAL HIGH (ref 11.5–15.5)
WBC: 6.7 10*3/uL (ref 4.0–10.5)
nRBC: 0 % (ref 0.0–0.2)

## 2022-05-24 LAB — GLUCOSE, CAPILLARY
Glucose-Capillary: 187 mg/dL — ABNORMAL HIGH (ref 70–99)
Glucose-Capillary: 200 mg/dL — ABNORMAL HIGH (ref 70–99)
Glucose-Capillary: 220 mg/dL — ABNORMAL HIGH (ref 70–99)
Glucose-Capillary: 261 mg/dL — ABNORMAL HIGH (ref 70–99)
Glucose-Capillary: 274 mg/dL — ABNORMAL HIGH (ref 70–99)

## 2022-05-24 LAB — MAGNESIUM: Magnesium: 2.7 mg/dL — ABNORMAL HIGH (ref 1.7–2.4)

## 2022-05-24 MED ORDER — FUROSEMIDE 10 MG/ML IJ SOLN
80.0000 mg | Freq: Once | INTRAMUSCULAR | Status: AC
  Administered 2022-05-24: 80 mg via INTRAVENOUS
  Filled 2022-05-24: qty 8

## 2022-05-24 MED ORDER — HYDROMORPHONE HCL 1 MG/ML IJ SOLN
0.2500 mg | INTRAMUSCULAR | Status: DC | PRN
Start: 2022-05-24 — End: 2022-06-09
  Administered 2022-05-25 – 2022-05-26 (×2): 0.25 mg via INTRAVENOUS
  Filled 2022-05-24 (×2): qty 0.5

## 2022-05-24 MED ORDER — FUROSEMIDE 10 MG/ML IJ SOLN
40.0000 mg | Freq: Once | INTRAMUSCULAR | Status: AC
  Administered 2022-05-24: 40 mg via INTRAVENOUS
  Filled 2022-05-24: qty 4

## 2022-05-24 MED ORDER — FUROSEMIDE 10 MG/ML IJ SOLN
80.0000 mg | Freq: Once | INTRAMUSCULAR | Status: AC
  Administered 2022-05-24: 40 mg via INTRAVENOUS
  Filled 2022-05-24: qty 8

## 2022-05-24 NOTE — Progress Notes (Signed)
Pt was having dizzy spells when she sits up in bed, pt had 3 episodes of near syncope, while sitting on the bed, her BP was ok though; 151/71, HR 60.  Pt has been having these episodes for a while but more while in the hospital,  They did hold her Lasix thinking this could be the problem and she was diagnosed with having benign positional vertigo thru her outpatient MD.  Pt also has a hx of a CVA with now a issue with her balancing.  On call MD was notified and aware.  MD ordered a MRI of the brain, will continue to monitor, Thanks Arvella Nigh RN.

## 2022-05-24 NOTE — Progress Notes (Signed)
Inpatient Rehab Admissions Coordinator:    I do not have insurance auth or a bed on CIR for this Pt. Today. I will follow for potential admit pending insurance auth or bed availability.   Clemens Catholic, [place], [place] Admissions Coordinator  [phone] ([place]) [phone] (office)

## 2022-05-24 NOTE — Care Management Important Message (Signed)
Important Message  Patient Details  Name: Tracey Bryant MRN: [id number] Date of Birth: 09/26/1951   Medicare Important Message Given:  Yes     Dailon Sheeran 05/24/2022, 1:48 PM

## 2022-05-24 NOTE — Progress Notes (Signed)
Progress Note  Patient Name: Tracey Bryant Date of Encounter: 05/24/2022  Primary Cardiologist:   Minus Breeding, MD   Subjective   She is still not breathing at baseline   Inpatient Medications    Scheduled Meds:  apixaban  5 mg Oral BID   carvedilol  6.25 mg Oral BID WC   docusate sodium  100 mg Oral BID   feeding supplement (GLUCERNA SHAKE)  237 mL Oral TID BM   furosemide  80 mg Intravenous Once   hydrALAZINE  50 mg Oral Q8H   insulin aspart  0-15 Units Subcutaneous TID WC   insulin aspart  0-5 Units Subcutaneous QHS   insulin glargine-yfgn  12 Units Subcutaneous Daily   isosorbide mononitrate  15 mg Oral Daily   multivitamin with minerals  1 tablet Oral Daily   sodium chloride flush  3 mL Intravenous Q12H   Continuous Infusions:  PRN Meds: acetaminophen **OR** acetaminophen, bisacodyl, hydrALAZINE, ondansetron **OR** ondansetron (ZOFRAN) IV, oxyCODONE, polyethylene glycol, traZODone   Vital Signs    Vitals:   05/23/22 1104 05/23/22 1935 05/24/22 0602 05/24/22 0731  BP: (!) 144/72 134/62 132/65 130/63  Pulse: 70  61 62  Resp: 16 (!) '22 19 13  '$ Temp:  97.8 F (36.6 C) 97.9 F (36.6 C) (!) 97.3 F (36.3 C)  TempSrc:  Oral Oral Oral  SpO2: 96% 98% 99% 98%  Weight:   91.4 kg   Height:        Intake/Output Summary (Last 24 hours) at 05/24/2022 0827 Last data filed at 05/24/2022 0616 Gross per 24 hour  Intake 970 ml  Output 1800 ml  Net -830 ml   Filed Weights   05/22/22 0458 05/23/22 0400 05/24/22 0602  Weight: 92.7 kg 93.5 kg 91.4 kg      ECG    NA - Personally Reviewed  Physical Exam   GEN: No acute distress.   Neck: No  JVD Cardiac: RRR, no murmurs, rubs, or gallops.  Respiratory:     Decreased breath sounds right greater than left to auscultation bilaterally. GI: Soft, nontender, non-distended  MS:    Moderate edema; No deformity. Neuro:  Nonfocal  Psych: Normal affect   Labs    Chemistry Recent Labs  Lab 05/21/22 0256  05/22/22 0450 05/23/22 0329 05/24/22 0207  NA 136 137 136 135  K 4.3 4.3 4.8 4.6  CL 103 105 105 101  CO2 '24 24 24 25  '$ GLUCOSE 303* 192* 202* 230*  BUN 21 25* 30* 34*  CREATININE 2.70* 2.79* 3.00* 3.19*  CALCIUM 8.0* 8.0* 8.2* 8.2*  PROT 5.3*  --   --   --   ALBUMIN 1.9*  --   --  2.1*  AST 11*  --   --   --   ALT 9  --   --   --   ALKPHOS 56  --   --   --   BILITOT 0.5  --   --   --   GFRNONAA 18* 18* 16* 15*  ANIONGAP '9 8 7 9     '$ Hematology Recent Labs  Lab 05/22/22 0450 05/23/22 0329 05/24/22 0207  WBC 6.7 7.4 6.7  RBC 3.64* 3.81* 3.70*  HGB 10.3* 11.0* 10.8*  HCT 33.3* 34.8* 33.3*  MCV 91.5 91.3 90.0  MCH 28.3 28.9 29.2  MCHC 30.9 31.6 32.4  RDW 15.4 15.6* 15.6*  PLT 318 319 327    Cardiac EnzymesNo results for input(s): TROPONINI in the last 168 hours.  No results for input(s): TROPIPOC in the last 168 hours.   BNP Recent Labs  Lab 05/19/22 1040  BNP 2,083.9*     DDimer No results for input(s): DDIMER in the last 168 hours.   Radiology    DG CHEST PORT 1 VIEW  Result Date: 05/23/2022 CLINICAL DATA:  Shortness of breath EXAM: PORTABLE CHEST 1 VIEW COMPARISON:  Chest radiograph dated May 19, 2022 FINDINGS: The heart is enlarged. Atherosclerotic calcification of the aortic arch. Left access pacemaker leads are unchanged. Interval increase in hazy opacities in bilateral lung bases suggesting pleural effusions and/or atelectasis. IMPRESSION: 1. Interval increase in bilateral lower lobe hazy opacities suggesting pleural effusion and/or atelectasis. 2.  Cardiomediastinal silhouette is unchanged. Electronically Signed   By: Keane Police D.O.   On: 05/23/2022 10:18    Cardiac Studies    Echo 05/20/22 1. Left ventricular ejection fraction, by estimation, is 50 to 55%. The  left ventricle has low normal function. The left ventricle has no regional  wall motion abnormalities. Left ventricular diastolic parameters are  consistent with Grade II diastolic   dysfunction (pseudonormalization). Elevated left ventricular end-diastolic  pressure. The E/e' is 21.   2. Right ventricular systolic function is normal. The right ventricular  size is mildly enlarged. There is mildly elevated pulmonary artery  systolic pressure. The estimated right ventricular systolic pressure is  62.5 mmHg.   3. Left atrial size was mildly dilated.   4. The mitral valve is abnormal. Trivial mitral valve regurgitation.   5. The aortic valve is tricuspid. Aortic valve regurgitation is not  visualized.   6. The inferior vena cava is normal in size with <50% respiratory  variability, suggesting right atrial pressure of 8 mmHg.   Comparison(s): Changes from prior study are noted. 12/26/2021: LVEF  40-45%, RVSP 45.8 mmHg.   Patient Profile     71 y.o. female with PMH cholecystitis with cholecystostomy tube in place (initially placed 03/19/2021), afib, HTN, DMII, sCHF, CVA who presented with shortness of breath.  She also had reported increased lower extremity swelling/overload with no improvement on her home Lasix. She underwent thoracentesis on 05/19/2022 removing 1.3 L.  Initially Lasix was held after developing some dizziness. Respiratory status improved after thoracentesis.      Assessment & Plan    ACUTE SYSTOLIC AND DIASTOLIC HF:  Lasix increased yesterday to 80 bid.    Net negative 2.2 liters.  In addition 1.3 removed with thoracentesis.  However, not robust urine out put.  I will give additional Lasix this PM.  Nephrology is following and the patient might need dialysis.     AKI:   Creat continues to worsen.   See above.    ATRIAL FIB:  On Eliquis.  Continue.     For questions or updates, please contact [facility] Please consult [email or web address] for contact info under Cardiology/STEMI.   Signed, Minus Breeding, MD  05/24/2022, 8:27 AM

## 2022-05-24 NOTE — Progress Notes (Signed)
Occupational Therapy Treatment Patient Details Name: Tracey Bryant MRN: [id number] DOB: 10-19-1951 Today's Date: 05/24/2022   History of present illness The pt is a 71 yo female presenting from home 5/24 with SOB and BLE swelling. Found to have acute CHF exacerbation and R pleural effusion s/p thoracentesis 5/24. PMH includes: arthritis, CHF, DM II, DVT, HTN, afib, and stroke.   OT comments  Patient was min guard with use of rails to get to EOB. Patient felt dizzy initially on EOB but cleared with rest. Patient able to stand with min assist and transfer to chair with RW and no episodes of knee buckling. Patient instructed on AE use for LB dressing with mod assist to donn socks with sock aide. Patient performed grooming tasks seated at sink due to continues to be uncomfortable standing at sink Acute OT to continue to follow.    Recommendations for follow up therapy are one component of a multi-disciplinary discharge planning process, led by the attending physician.  Recommendations may be updated based on patient status, additional functional criteria and insurance authorization.    Follow Up Recommendations  Acute inpatient rehab (3hours/day)    Assistance Recommended at Discharge Intermittent Supervision/Assistance  Patient can return home with the following  A little help with walking and/or transfers;A little help with bathing/dressing/bathroom;Assistance with cooking/housework;Help with stairs or ramp for entrance   Equipment Recommendations  Other (comment) (continue to assess)    Recommendations for Other Services      Precautions / Restrictions Precautions Precautions: Fall Precaution Comments: poor endurance and awareness of deficits Restrictions Weight Bearing Restrictions: No       Mobility Bed Mobility Overal bed mobility: Needs Assistance Bed Mobility: Sit to Supine     Supine to sit: Min guard, HOB elevated     General bed mobility comments: increased time and  use of rails    Transfers Overall transfer level: Needs assistance Equipment used: Rolling walker (2 wheels) Transfers: Sit to/from Stand, Bed to chair/wheelchair/BSC Sit to Stand: Min assist     Step pivot transfers: Min assist     General transfer comment: RW used for transfers with no episodes of knee buckline     Balance Overall balance assessment: Needs assistance Sitting-balance support: Single extremity supported, No upper extremity supported, Feet supported Sitting balance-Leahy Scale: Fair Sitting balance - Comments: no assistance for sitting balance on EOB   Standing balance support: Bilateral upper extremity supported, Reliant on assistive device for balance Standing balance-Leahy Scale: Poor Standing balance comment: Reliant on RW for balance and min assist                           ADL either performed or assessed with clinical judgement   ADL Overall ADL's : Needs assistance/impaired     Grooming: Wash/dry hands;Wash/dry face;Oral care;Set up;Sitting Grooming Details (indicate cue type and reason): seated at sink             Lower Body Dressing: Moderate assistance;Sitting/lateral leans;Sit to/from stand Lower Body Dressing Details (indicate cue type and reason): education on AE use for LB dressing with mod assist to use sock aide               General ADL Comments: patient continues to be fearful of falling when standing at sink    Extremity/Trunk Assessment              Vision       Perception  Praxis      Cognition Arousal/Alertness: Awake/alert Behavior During Therapy: WFL for tasks assessed/performed Overall Cognitive Status: Impaired/Different from baseline Area of Impairment: Memory, Safety/judgement, Awareness, Problem solving                     Memory: Decreased short-term memory   Safety/Judgement: Decreased awareness of safety, Decreased awareness of deficits Awareness: Intellectual Problem  Solving: Decreased initiation, Difficulty sequencing, Requires verbal cues General Comments: continues to be motivated towards therapy        Exercises      Shoulder Instructions       General Comments      Pertinent Vitals/ Pain       Pain Assessment Pain Assessment: Faces Faces Pain Scale: Hurts little more Pain Location: R shoulder, bilateral knees (R knee > L knee) Pain Descriptors / Indicators: Aching, Discomfort Pain Intervention(s): Limited activity within patient's tolerance, Monitored during session  Home Living                                          Prior Functioning/Environment              Frequency  Min 2X/week        Progress Toward Goals  OT Goals(current goals can now be found in the care plan section)  Progress towards OT goals: Progressing toward goals  Acute Rehab OT Goals Patient Stated Goal: get better OT Goal Formulation: With patient Time For Goal Achievement: 06/03/22 Potential to Achieve Goals: Fair ADL Goals Pt Will Perform Lower Body Dressing: with modified independence;with adaptive equipment;sitting/lateral leans;sit to/from stand Pt Will Transfer to Toilet: with modified independence;ambulating;bedside commode Pt Will Perform Toileting - Clothing Manipulation and hygiene: with modified independence;sitting/lateral leans;sit to/from stand Additional ADL Goal #1: Pt will be Mod I bed mobility in preparation for increased participation w/ ADL's Additional ADL Goal #2: Pt will state and implement 1-2 energy conservation techniques w/ less than 2 vc's (issue and review handout)  Plan Discharge plan remains appropriate    Co-evaluation                 AM-PAC OT "6 Clicks" Daily Activity     Outcome Measure   Help from another person eating meals?: None Help from another person taking care of personal grooming?: A Little Help from another person toileting, which includes using toliet, bedpan, or urinal?:  A Little Help from another person bathing (including washing, rinsing, drying)?: A Lot Help from another person to put on and taking off regular upper body clothing?: A Little Help from another person to put on and taking off regular lower body clothing?: A Lot 6 Click Score: 17    End of Session Equipment Utilized During Treatment: Gait belt;Rolling walker (2 wheels)  OT Visit Diagnosis: Unsteadiness on feet (R26.81);Muscle weakness (generalized) (M62.81)   Activity Tolerance Patient tolerated treatment well   Patient Left in chair;with call bell/phone within reach   Nurse Communication Mobility status        Time: [id number] OT Time Calculation (min): 28 min  Charges: OT General Charges $OT Visit: 1 Visit OT Treatments $Self Care/Home Management : 23-37 mins  Lodema Hong, [place]  Pager [phone] Office [place] 05/24/2022, 9:56 AM

## 2022-05-24 NOTE — Progress Notes (Signed)
Physical Therapy Treatment Patient Details Name: Tracey Bryant MRN: [id number] DOB: 02-20-1951 Today's Date: 05/24/2022   History of Present Illness The pt is a 71 yo female presenting from home 5/24 with SOB and BLE swelling. Found to have acute CHF exacerbation and R pleural effusion s/p thoracentesis 5/24. PMH includes: arthritis, CHF, DM II, DVT, HTN, afib, and stroke.    PT Comments    The pt was agreeable to session with focus on progressing OOB mobility and endurance. The pt remains highly motivated, but continues to be limited by significant fatigue. She was able to complete sit-stand with min-modA of 2, but on initial stand had onset of significant full-body instability and sway requiring maxA of 2 and return to sitting. The pt was then able to complete further standing trials with up to 10-15 min standing balance at a time with addition of standing marches and minA of 2. The pt was able to take small lateral steps along EOB with RW and minA, BP remained stable with initial transition to standing and did not correlate with moments of increased sway and instability. Continue to recommend acute inpatient rehab at d/c, may benefit from vestibular assessment to determine if this could play a role with her sx.    Recommendations for follow up therapy are one component of a multi-disciplinary discharge planning process, led by the attending physician.  Recommendations may be updated based on patient status, additional functional criteria and insurance authorization.  Follow Up Recommendations  Acute inpatient rehab (3hours/day)     Assistance Recommended at Discharge Frequent or constant Supervision/Assistance  Patient can return home with the following Two people to help with walking and/or transfers;Two people to help with bathing/dressing/bathroom;Assistance with cooking/housework;Direct supervision/assist for medications management;Direct supervision/assist for financial management;Assist  for transportation;Help with stairs or ramp for entrance   Equipment Recommendations  None recommended by PT    Recommendations for Other Services       Precautions / Restrictions Precautions Precautions: Fall Precaution Comments: poor endurance and awareness of deficits Restrictions Weight Bearing Restrictions: No     Mobility  Bed Mobility Overal bed mobility: Needs Assistance Bed Mobility: Sit to Supine     Supine to sit: Min guard, HOB elevated     General bed mobility comments: increased time and use of rails    Transfers Overall transfer level: Needs assistance Equipment used: Rolling walker (2 wheels) Transfers: Sit to/from Stand Sit to Stand: Min assist, Mod assist           General transfer comment: pt with full-body sway and instability in standing requireing modA tof 2 to steady and max cues to maintain UE on the RW, keep her eyes focused forwards, and maintain knees straight. Once calmed and distracted, the pt was able to maintain with minA of 2 but had moments of instability and sway that again needed modA of 2 to maintain.    Ambulation/Gait             Pre-gait activities: standing marching, modA of 2 to maintain balance. improved performance when talking about grandkids General Gait Details: pt states she is too fatigued, also with greater frequency of full-body instability today.      Balance Overall balance assessment: Needs assistance Sitting-balance support: Single extremity supported, No upper extremity supported, Feet supported Sitting balance-Leahy Scale: Fair Sitting balance - Comments: up to modA to maintain static sitting, cues to maintain head up and to remain in sitting, pt appears fatigued. needs less cues when distracted  by conversation   Standing balance support: Bilateral upper extremity supported, Reliant on assistive device for balance, During functional activity Standing balance-Leahy Scale: Poor Standing balance comment:  up to modA of 2 with max cues to maintain UE on RW                            Cognition Arousal/Alertness: Awake/alert Behavior During Therapy: WFL for tasks assessed/performed Overall Cognitive Status: Impaired/Different from baseline Area of Impairment: Memory, Safety/judgement, Awareness, Problem solving                     Memory: Decreased short-term memory   Safety/Judgement: Decreased awareness of safety, Decreased awareness of deficits Awareness: Intellectual Problem Solving: Decreased initiation, Difficulty sequencing, Requires verbal cues General Comments: pt very motivated, but has moments of disorientation, poor problem solving, and just globally impaired cognition. Cognition fluctuates multiple times through the session. For example, pt able to list names and ages of all grandchildren and recalls therapist from prior session, when asked to look up at the ceiling, pt asking "where is the ceiling" or needing max cues to follow instructions.        Exercises      General Comments General comments (skin integrity, edema, etc.): VSS on RA, RN requested pt be left on 2L after session. BP from 142/82 in sitting to 153/73 in standing with low of 123/76 in standing      Pertinent Vitals/Pain Pain Assessment Pain Assessment: Faces Faces Pain Scale: Hurts little more Pain Location: R shoulder, bilateral knees (R knee > L knee) Pain Descriptors / Indicators: Aching, Discomfort Pain Intervention(s): Monitored during session, Limited activity within patient's tolerance     PT Goals (current goals can now be found in the care plan section) Acute Rehab PT Goals Patient Stated Goal: return to greater independence PT Goal Formulation: With patient Time For Goal Achievement: 06/03/22 Potential to Achieve Goals: Good Progress towards PT goals: Progressing toward goals    Frequency    Min 3X/week      PT Plan Current plan remains appropriate        AM-PAC PT "6 Clicks" Mobility   Outcome Measure  Help needed turning from your back to your side while in a flat bed without using bedrails?: A Little Help needed moving from lying on your back to sitting on the side of a flat bed without using bedrails?: A Little Help needed moving to and from a bed to a chair (including a wheelchair)?: A Lot Help needed standing up from a chair using your arms (e.g., wheelchair or bedside chair)?: A Lot Help needed to walk in hospital room?: Total Help needed climbing 3-5 steps with a railing? : Total 6 Click Score: 12    End of Session Equipment Utilized During Treatment: Gait belt Activity Tolerance: Patient limited by fatigue;Patient limited by pain Patient left: in bed;with call bell/phone within reach;with bed alarm set Nurse Communication: Mobility status PT Visit Diagnosis: Other abnormalities of gait and mobility (R26.89);Muscle weakness (generalized) (M62.81)     Time: 1332-1410 PT Time Calculation (min) (ACUTE ONLY): 38 min  Charges:  $Therapeutic Exercise: 23-37 mins $Therapeutic Activity: 8-22 mins                     West Carbo, PT, DPT   Acute Rehabilitation Department Pager #: [phone]   Sandra Cockayne 05/24/2022, 3:09 PM

## 2022-05-24 NOTE — Progress Notes (Signed)
Progress Note    Tracey Bryant   BVA:[id number]  DOB: October 22, 1951  DOA: 05/19/2022     4 PCP: Coolidge Breeze, FNP  Initial CC: Shortness of breath  Hospital Course: Tracey Bryant is a 71 y.o. female with PMH cholecystitis with cholecystostomy tube in place (initially placed 03/19/2021), afib, HTN, DMII, sCHF, CVA who presented with shortness of breath.  She also had reported increased lower extremity swelling/overload with no improvement on her home Lasix. She underwent thoracentesis on 05/19/2022 removing 1.3 L.  Initially Lasix was held after developing some dizziness. Respiratory status improved after thoracentesis.    She also was evaluated by IR due to difficulty with cholecystostomy tube drainage.  Tube was evaluated and noted intact however suture had been removed but tube had not been retracted any.  After forceful flushing, biliary output was achieved with no further issues.  Interval History:  IV infiltrated this morning during partial administration of Lasix.  New IV to be placed and difference of Lasix ordered to equal desired dose for the morning. Still SOB and anxious; will give her low dose dilaudid as trial for air hunger.   Assessment and Plan: * Acute on chronic combined systolic and diastolic congestive heart failure (HCC) - Moderate to large right-sided pleural effusion on admission along with worsening dyspnea/SOB, increased lower extremity edema and no improvement with home Lasix -Underwent thoracentesis on 5/24 with removal of 1.3 L -Initially Lasix held due to some dizziness and worsening renal function however lower extremity edema persisted and lasix restarted 5/27 - some improvement in SOB today; did diurese better with higher dose lasix; continued on lasix 80 mg BID for now - IV infiltrated this morning and only got ~$RemoveB'40mg'zzApIrNZ$  so giving additional 40 mg dose to equal 80 mg for this morning after new IV obtained  - cardiology and nephrology following  - continue  strict I&O  Acute renal failure superimposed on stage 4 chronic kidney disease ([place]) - patient has history of CKD4. Baseline creat ~ 1.6 - 1.9, eGFR 25 -Lasix restarted on 05/22/2022 - Etiology possibly due to CRS from underlying CHF; ATN also a possibility from hypoxia on admission - bladder scan negative for retention; voiding well - nephrology consulted given worsening renal function; no indication currently to warrant HD - possible outpt kidney biopsy given nephrotic range proteinuria  - continue monitoring output  Pleural effusion due to CHF (congestive heart failure) (HCC)-resolved as of 05/22/2022 - Due to underlying CHF - S/p right thoracentesis on 05/19/2022, removing 1.3 L  Acute respiratory failure with hypoxia (HCC) - Due to underlying pleural effusion initially on admission and volume overload - Continue Lasix - Wean oxygen off as able.  Not on home O2 - CXR shows worsening edema on 5/28  Cholecystitis, chronic - Diagnosed with acute cholecystitis March 2022.  Underwent cholecystostomy tube placement on 03/19/2021 - Tube output had decreased on admission and underwent flushing with radiology with return of function -Continue flushing drain each shift and document output  Physical deconditioning - Evaluated by PT/OT - Recommendations are for acute inpatient rehab if approved - Follow-up pending CIR evaluation  History of CVA (cerebrovascular accident) - Continue Eliquis  Paroxysmal atrial fibrillation ([place]) - Continue Coreg and Eliquis  Uncontrolled type 2 diabetes mellitus with hyperglycemia, with long-term current use of insulin (HCC) - Last A1c 7.7% - Continue SSI and CBG monitoring  Hypertension - Continue Coreg, hydralazine, Imdur    Old records reviewed in assessment of this patient  Antimicrobials:  DVT prophylaxis:   apixaban (ELIQUIS) tablet 5 mg   Code Status:   Code Status: Full Code  Disposition Plan: Pending CIR evaluation Status is:  Inpatient  Objective: Blood pressure (!) 156/71, pulse 66, temperature 98.5 F (36.9 C), temperature source Oral, resp. rate 18, height $RemoveBe'5\' 6"'[id number]$  (1.676 m), weight 91.4 kg, SpO2 100 %.  Examination:  Physical Exam Constitutional:      General: She is not in acute distress.    Appearance: She is well-developed.     Comments: Anxious appearing  HENT:     Head: Normocephalic and atraumatic.     Mouth/Throat:     Mouth: Mucous membranes are moist.  Eyes:     Extraocular Movements: Extraocular movements intact.  Cardiovascular:     Rate and Rhythm: Normal rate and regular rhythm.  Pulmonary:     Comments: More dull breath sounds over right lower lung fields; no wheezing Abdominal:     General: Bowel sounds are normal. There is no distension.     Palpations: Abdomen is soft.     Comments: Expected tenderness to palpation around cholecystostomy tube which is chronic.  Biliary fluid appreciated in bag  Musculoskeletal:        General: Swelling present.     Cervical back: Normal range of motion and neck supple.     Comments: 2+ bilateral lower extremity pitting edema  Skin:    General: Skin is warm and dry.  Neurological:     General: No focal deficit present.     Mental Status: She is alert.  Psychiatric:        Mood and Affect: Mood normal.        Behavior: Behavior normal.     Consultants:  Cardiology IR Nephrology   Procedures:  Right thoracentesis, 05/19/2022 Biliary drain flushing per IR, 05/21/2022  Data Reviewed: Results for orders placed or performed during the hospital encounter of 05/19/22 (from the past 24 hour(s))  Glucose, capillary     Status: Abnormal   Collection Time: 05/23/22  3:16 PM  Result Value Ref Range   Glucose-Capillary 219 (H) 70 - 99 mg/dL  Urinalysis, Routine w reflex microscopic Urine, Catheterized     Status: Abnormal   Collection Time: 05/23/22  4:00 PM  Result Value Ref Range   Color, Urine YELLOW YELLOW   APPearance CLOUDY (A) CLEAR    Specific Gravity, Urine 1.006 1.005 - 1.030   pH 6.0 5.0 - 8.0   Glucose, UA NEGATIVE NEGATIVE mg/dL   Hgb urine dipstick NEGATIVE NEGATIVE   Bilirubin Urine NEGATIVE NEGATIVE   Ketones, ur NEGATIVE NEGATIVE mg/dL   Protein, ur 100 (A) NEGATIVE mg/dL   Nitrite NEGATIVE NEGATIVE   Leukocytes,Ua LARGE (A) NEGATIVE   RBC / HPF 6-10 0 - 5 RBC/hpf   WBC, UA >50 (H) 0 - 5 WBC/hpf   Bacteria, UA MANY (A) NONE SEEN   Squamous Epithelial / LPF 0-5 0 - 5   WBC Clumps PRESENT   Protein / creatinine ratio, urine     Status: Abnormal   Collection Time: 05/23/22  4:00 PM  Result Value Ref Range   Creatinine, Urine 23.48 mg/dL   Total Protein, Urine 90 mg/dL   Protein Creatinine Ratio 3.83 (H) 0.00 - 0.15 mg/mg[Cre]  Glucose, capillary     Status: Abnormal   Collection Time: 05/23/22  9:25 PM  Result Value Ref Range   Glucose-Capillary 318 (H) 70 - 99 mg/dL   Comment 1 Notify RN  Comment 2 Document in Chart   CBC with Differential/Platelet     Status: Abnormal   Collection Time: 05/24/22  2:07 AM  Result Value Ref Range   WBC 6.7 4.0 - 10.5 K/uL   RBC 3.70 (L) 3.87 - 5.11 MIL/uL   Hemoglobin 10.8 (L) 12.0 - 15.0 g/dL   HCT 33.3 (L) 36.0 - 46.0 %   MCV 90.0 80.0 - 100.0 fL   MCH 29.2 26.0 - 34.0 pg   MCHC 32.4 30.0 - 36.0 g/dL   RDW 15.6 (H) 11.5 - 15.5 %   Platelets 327 150 - 400 K/uL   nRBC 0.0 0.0 - 0.2 %   Neutrophils Relative % 67 %   Neutro Abs 4.5 1.7 - 7.7 K/uL   Lymphocytes Relative 17 %   Lymphs Abs 1.1 0.7 - 4.0 K/uL   Monocytes Relative 8 %   Monocytes Absolute 0.6 0.1 - 1.0 K/uL   Eosinophils Relative 7 %   Eosinophils Absolute 0.5 0.0 - 0.5 K/uL   Basophils Relative 1 %   Basophils Absolute 0.0 0.0 - 0.1 K/uL   Immature Granulocytes 0 %   Abs Immature Granulocytes 0.01 0.00 - 0.07 K/uL  Magnesium     Status: Abnormal   Collection Time: 05/24/22  2:07 AM  Result Value Ref Range   Magnesium 2.7 (H) 1.7 - 2.4 mg/dL  Renal function panel     Status: Abnormal    Collection Time: 05/24/22  2:07 AM  Result Value Ref Range   Sodium 135 135 - 145 mmol/L   Potassium 4.6 3.5 - 5.1 mmol/L   Chloride 101 98 - 111 mmol/L   CO2 25 22 - 32 mmol/L   Glucose, Bld 230 (H) 70 - 99 mg/dL   BUN 34 (H) 8 - 23 mg/dL   Creatinine, Ser 3.19 (H) 0.44 - 1.00 mg/dL   Calcium 8.2 (L) 8.9 - 10.3 mg/dL   Phosphorus 4.4 2.5 - 4.6 mg/dL   Albumin 2.1 (L) 3.5 - 5.0 g/dL   GFR, Estimated 15 (L) >60 mL/min   Anion gap 9 5 - 15  Glucose, capillary     Status: Abnormal   Collection Time: 05/24/22  6:04 AM  Result Value Ref Range   Glucose-Capillary 187 (H) 70 - 99 mg/dL   Comment 1 Notify RN    Comment 2 Document in Chart   Glucose, capillary     Status: Abnormal   Collection Time: 05/24/22 11:04 AM  Result Value Ref Range   Glucose-Capillary 200 (H) 70 - 99 mg/dL  Glucose, capillary     Status: Abnormal   Collection Time: 05/24/22 11:27 AM  Result Value Ref Range   Glucose-Capillary 220 (H) 70 - 99 mg/dL    I have Reviewed nursing notes, Vitals, and Lab results since pt's last encounter. Pertinent lab results : see above I have ordered test including BMP, CBC, Mg I have reviewed the last note from staff over past 24 hours I have discussed pt's care plan and test results with nursing staff, case manager   LOS: 4 days   Dwyane Dee, MD [facility] 05/24/2022, 11:42 AM

## 2022-05-24 NOTE — Progress Notes (Addendum)
Inpatient Diabetes Program Recommendations  AACE/ADA: New Consensus Statement on Inpatient Glycemic Control (2015)  Target Ranges:  Prepandial:   less than 140 mg/dL      Peak postprandial:   less than 180 mg/dL (1-2 hours)      Critically ill patients:  140 - 180 mg/dL   Lab Results  Component Value Date   GLUCAP 220 (H) 05/24/2022   HGBA1C 9.4 (H) 05/21/2022    Review of Glycemic Control   Latest Reference Range & Units 05/23/22 05:47 05/23/22 11:02 05/23/22 15:16 05/23/22 21:25 05/24/22 06:04 05/24/22 11:04 05/24/22 11:27  Glucose-Capillary 70 - 99 mg/dL 191 (H) 246 (H) 219 (H) 318 (H) 187 (H) 200 (H) 220 (H)   Diabetes history: DM 2 Outpatient Diabetes medications:  Glucotrol 5 mg daily (not taking) Current orders for Inpatient glycemic control:  Semglee 12 units daily Novolog moderate tid with meals Glucerna tid between meals  Inpatient Diabetes Program Recommendations:    -  Consider increasing Semglee to 15 units daily.  -  May also benefit from Novolog 2 units tid meal coverage if eating >50% of meals   Thanks,  Tama Headings RN, MSN, BC-ADM Inpatient Diabetes Coordinator Team Pager [phone] (8a-5p)

## 2022-05-24 NOTE — Progress Notes (Signed)
[facility] Progress Note    Assessment/ Plan:   AKI on CKD3B-4 -likely hemodynamically mediated in the context of acute sCHF exacerbation. Underlying CKD likely related to HTN and DM -Cr relatively stable, up to 3.19 today from 3 yesterday. 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 Would hold off on biopsy for now especially given resp status and I can't foresee her laying flat for a biopsy. Management at this time would not change if we were to do a biopsy. Can certainly consider this as an outpatient -Avoid nephrotoxic medications including NSAIDs and iodinated intravenous contrast exposure unless the latter is absolutely indicated.  Preferred narcotic agents for pain control are hydromorphone, fentanyl, and methadone. Morphine should not be used. Avoid Baclofen and avoid oral sodium phosphate and magnesium citrate based laxatives / bowel preps. Continue strict Input and Output monitoring. Will monitor the patient closely with you and intervene or adjust therapy as indicated by changes in clinical status/labs   Acute on chronic sCHF exacerbation -good response to lasix, will redose as above -if needed, can increase to BID  HTN -BP w/ reasonable control  Pleural effusion secondary to CHF -s/p rt thora 5/24  Choleycystitis -per primary service  Anemia -hgb at goal for CKD  DM2 w/ hyperglycemia -DM mgmt per primary service  Subjective:   Patient seen and examined this AM. No acute events. No complaints. Patient reports that she only really urinates when she receives lasix. Uop ~1.4L (+2 unmeasured voids). Denies any chest pain, worsening SOB, n/v, loss of appetite, dysgeusia, brain fog, no shakes/tremors.   Objective:   BP 130/63 (BP Location: Left Arm)   Pulse 61   Temp (!)  97.3 F (36.3 C) (Oral)   Resp 13   Ht '5\' 6"'$  (1.676 m)   Wt 91.4 kg   SpO2 99%   BMI 32.52 kg/m   Intake/Output Summary (Last 24 hours) at 05/24/2022 3329 Last data filed at 05/24/2022 5188 Gross per 24 hour  Intake 956 ml  Output 1800 ml  Net -844 ml   Weight change: -2.1 kg  Physical Exam: Gen:NAD, sitting up in bed eating breakfast CVS:s1s2, rrr Resp:decreased breath sounds bibasilar CZY:SAYT Ext:2+ pitting edema b/l Les Neuro: awake, alert, no asterixis  Imaging: DG CHEST PORT 1 VIEW  Result Date: 05/23/2022 CLINICAL DATA:  Shortness of breath EXAM: PORTABLE CHEST 1 VIEW COMPARISON:  Chest radiograph dated May 19, 2022 FINDINGS: The heart is enlarged. Atherosclerotic calcification of the aortic arch. Left access pacemaker leads are unchanged. Interval increase in hazy opacities in bilateral lung bases suggesting pleural effusions and/or atelectasis. IMPRESSION: 1. Interval increase in bilateral lower lobe hazy opacities suggesting pleural effusion and/or atelectasis. 2.  Cardiomediastinal silhouette is unchanged. Electronically Signed   By: Keane Police D.O.   On: 05/23/2022 10:18    Labs: BMET Recent Labs  Lab 05/19/22 1040 05/20/22 0438 05/21/22 0256 05/22/22 0450 05/23/22 0329 05/24/22 0207  NA 139 137 136 137 136 135  K 3.9 4.1 4.3 4.3 4.8 4.6  CL 108 107 103 105 105 101  CO2 '22 23 24 24 24 25  '$ GLUCOSE 236* 250* 303* 192* 202* 230*  BUN '15 19 21 '$ 25* 30* 34*  CREATININE 2.13* 2.37* 2.70* 2.79* 3.00* 3.19*  CALCIUM 8.9 8.3* 8.0* 8.0* 8.2* 8.2*  PHOS  --   --   --   --   --  4.4   CBC Recent Labs  Lab 05/19/22 1040 05/20/22 0438 05/21/22 0256 05/22/22 0450 05/23/22 0329 05/24/22 0207  WBC 8.5   < > 6.3 6.7 7.4 6.7  NEUTROABS 5.7  --  4.2  --  4.9 4.5  HGB 12.4   < > 10.4* 10.3* 11.0* 10.8*  HCT 39.8   < > 32.8* 33.3* 34.8* 33.3*  MCV 92.1   < > 90.1 91.5 91.3 90.0  PLT 371   < > 293 318 319 327   < > = values in this interval not displayed.     Medications:     apixaban  5 mg Oral BID   carvedilol  6.25 mg Oral BID WC   docusate sodium  100 mg Oral BID   feeding supplement (GLUCERNA SHAKE)  237 mL Oral TID BM   furosemide  80 mg Intravenous Once   furosemide  80 mg Intravenous Once   hydrALAZINE  50 mg Oral Q8H   insulin aspart  0-15 Units Subcutaneous TID WC   insulin aspart  0-5 Units Subcutaneous QHS   insulin glargine-yfgn  12 Units Subcutaneous Daily   isosorbide mononitrate  15 mg Oral Daily   multivitamin with minerals  1 tablet Oral Daily   sodium chloride flush  3 mL Intravenous Q12H      Gean Quint, MD [facility] 05/24/2022, 9:23 AM

## 2022-05-25 ENCOUNTER — Inpatient Hospital Stay (HOSPITAL_COMMUNITY): Payer: Medicare HMO

## 2022-05-25 DIAGNOSIS — N179 Acute kidney failure, unspecified: Secondary | ICD-10-CM | POA: Diagnosis not present

## 2022-05-25 DIAGNOSIS — R42 Dizziness and giddiness: Secondary | ICD-10-CM

## 2022-05-25 DIAGNOSIS — J9601 Acute respiratory failure with hypoxia: Secondary | ICD-10-CM | POA: Diagnosis not present

## 2022-05-25 DIAGNOSIS — N184 Chronic kidney disease, stage 4 (severe): Secondary | ICD-10-CM | POA: Diagnosis not present

## 2022-05-25 DIAGNOSIS — I5043 Acute on chronic combined systolic (congestive) and diastolic (congestive) heart failure: Secondary | ICD-10-CM | POA: Diagnosis not present

## 2022-05-25 LAB — RENAL FUNCTION PANEL
Albumin: 2.1 g/dL — ABNORMAL LOW (ref 3.5–5.0)
Anion gap: 9 (ref 5–15)
BUN: 36 mg/dL — ABNORMAL HIGH (ref 8–23)
CO2: 22 mmol/L (ref 22–32)
Calcium: 8.2 mg/dL — ABNORMAL LOW (ref 8.9–10.3)
Chloride: 101 mmol/L (ref 98–111)
Creatinine, Ser: 3.01 mg/dL — ABNORMAL HIGH (ref 0.44–1.00)
GFR, Estimated: 16 mL/min — ABNORMAL LOW (ref >60)
Glucose, Bld: 151 mg/dL — ABNORMAL HIGH (ref 70–99)
Phosphorus: 4.7 mg/dL — ABNORMAL HIGH (ref 2.5–4.6)
Potassium: 4.7 mmol/L (ref 3.5–5.1)
Sodium: 132 mmol/L — ABNORMAL LOW (ref 135–145)

## 2022-05-25 LAB — CBC WITH DIFFERENTIAL/PLATELET
Abs Immature Granulocytes: 0.01 10*3/uL (ref 0.00–0.07)
Basophils Absolute: 0.1 10*3/uL (ref 0.0–0.1)
Basophils Relative: 1 %
Eosinophils Absolute: 0.5 10*3/uL (ref 0.0–0.5)
Eosinophils Relative: 7 %
HCT: 32.7 % — ABNORMAL LOW (ref 36.0–46.0)
Hemoglobin: 10.3 g/dL — ABNORMAL LOW (ref 12.0–15.0)
Immature Granulocytes: 0 %
Lymphocytes Relative: 17 %
Lymphs Abs: 1.3 10*3/uL (ref 0.7–4.0)
MCH: 28.4 pg (ref 26.0–34.0)
MCHC: 31.5 g/dL (ref 30.0–36.0)
MCV: 90.1 fL (ref 80.0–100.0)
Monocytes Absolute: 0.7 10*3/uL (ref 0.1–1.0)
Monocytes Relative: 9 %
Neutro Abs: 4.8 10*3/uL (ref 1.7–7.7)
Neutrophils Relative %: 66 %
Platelets: 334 10*3/uL (ref 150–400)
RBC: 3.63 MIL/uL — ABNORMAL LOW (ref 3.87–5.11)
RDW: 15.7 % — ABNORMAL HIGH (ref 11.5–15.5)
WBC: 7.3 10*3/uL (ref 4.0–10.5)
nRBC: 0 % (ref 0.0–0.2)

## 2022-05-25 LAB — GLUCOSE, CAPILLARY
Glucose-Capillary: 164 mg/dL — ABNORMAL HIGH (ref 70–99)
Glucose-Capillary: 181 mg/dL — ABNORMAL HIGH (ref 70–99)
Glucose-Capillary: 154 mg/dL — ABNORMAL HIGH (ref 70–99)
Glucose-Capillary: 163 mg/dL — ABNORMAL HIGH (ref 70–99)

## 2022-05-25 LAB — UREA NITROGEN, URINE: Urea Nitrogen, Ur: 121 mg/dL

## 2022-05-25 LAB — MAGNESIUM: Magnesium: 2.6 mg/dL — ABNORMAL HIGH (ref 1.7–2.4)

## 2022-05-25 IMAGING — MR MR HEAD W/O CM
12 of 13 series · 44 of 48 positions shown · non-contrast
Comparison: [DATE]

CLINICAL DATA: Neuro deficit, acute, stroke suspected

EXAM:
MRI HEAD WITHOUT CONTRAST
TECHNIQUE: Multiplanar, multiecho pulse sequences of the brain and surrounding
structures were obtained without intravenous contrast.

[Series 5: DWI · axial · 3.0mm · 0.88mm/px · z∈[-126,+20]mm · 7 of 100 slices shown (1 of 4)]
[im 1/100]
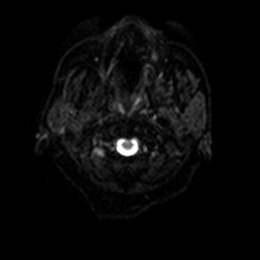
[im 17/100]
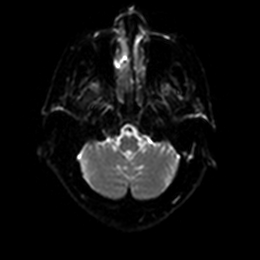
[im 34/100]
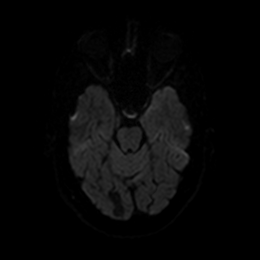
[im 50/100]
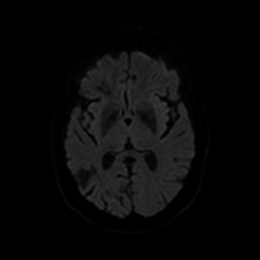
[im 67/100]
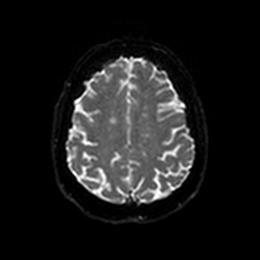
[im 83/100]
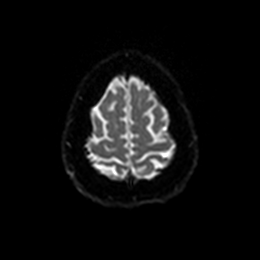
[im 100/100]
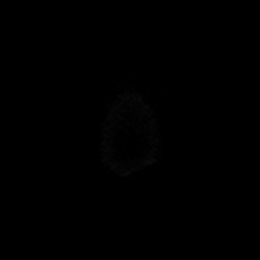

[Series 6: DWI · axial · 3.0mm · 0.88mm/px · z∈[-126,+20]mm · 4 of 50 slices shown (2 of 4)]
[im 1/50]
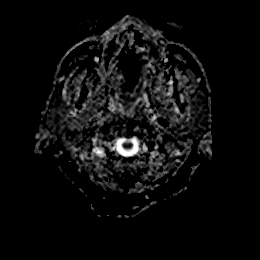
[im 17/50]
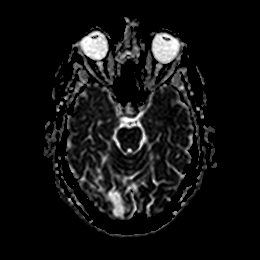
[im 33/50]
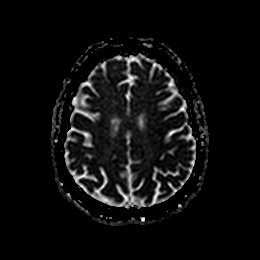
[im 50/50]
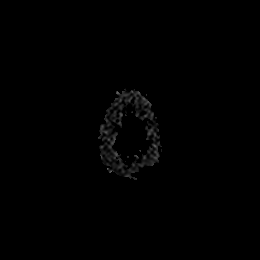

[Series 7: DWI · coronal · 4.0mm · 0.88mm/px · 6 of 70 slices shown (3 of 4)]
[im 1/70]
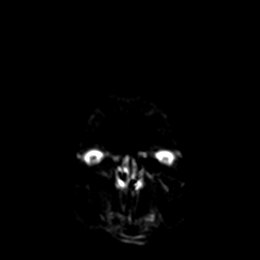
[im 14/70]
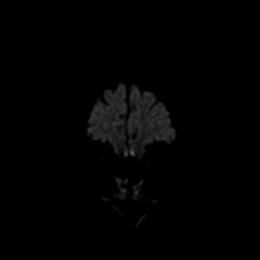
[im 28/70]
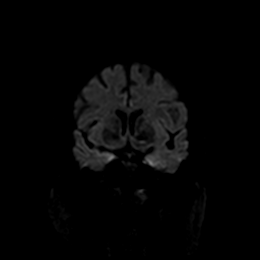
[im 42/70]
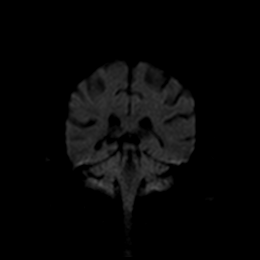
[im 56/70]
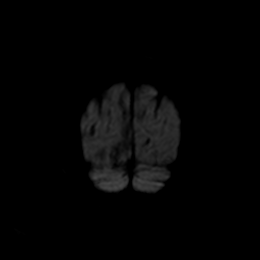
[im 70/70]
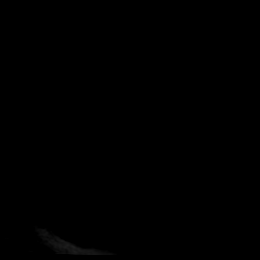

[Series 8: DWI · coronal · 4.0mm · 0.88mm/px · 3 of 35 slices shown (4 of 4)]
[im 1/35]
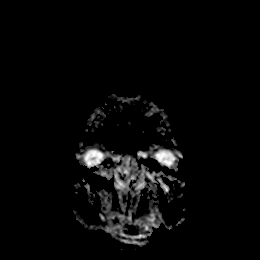
[im 18/35]
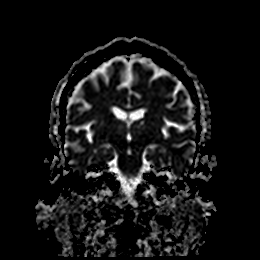
[im 35/35]
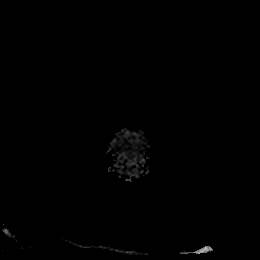

[Series 9: T1 · sagittal · 5.0mm · 0.75mm/px · 2 of 23 slices shown]
[im 1/23]
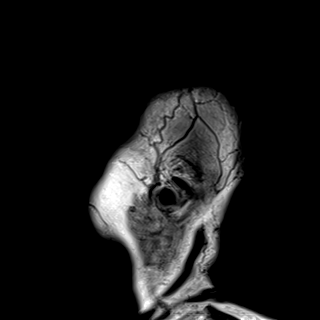
[im 23/23]
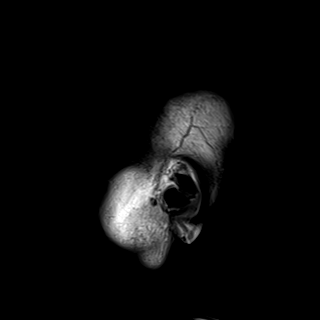

[Series 10: T2 · axial · 5.0mm · 0.72mm/px · z∈[-123,+26]mm · 2 of 26 slices shown (1 of 2)]
[im 1/26]
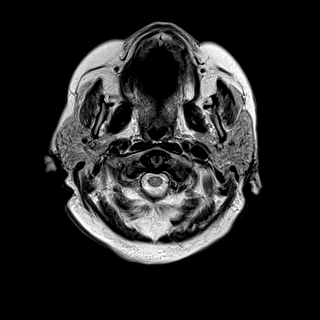
[im 26/26]
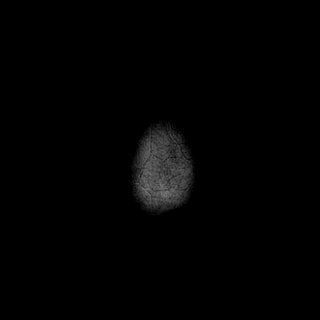

[Series 11: FLAIR · axial · 5.0mm · 0.45mm/px · z∈[-120,+29]mm · 2 of 26 slices shown]
[im 1/26]
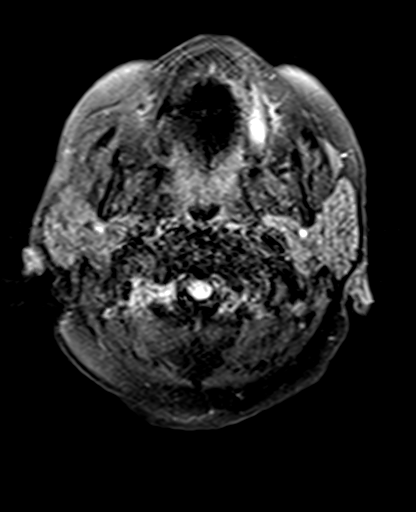
[im 26/26]
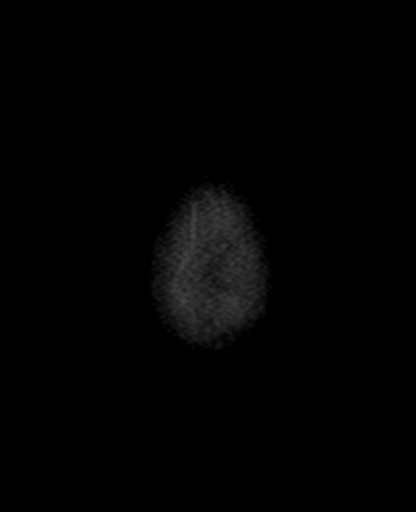

[Series 12: mag_images · axial · 3.0mm · 0.90mm/px · z∈[-123,+28]mm · 4 of 52 slices shown]
[im 1/52]
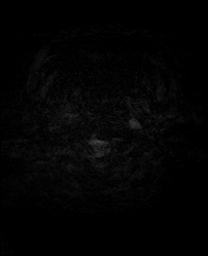
[im 18/52]
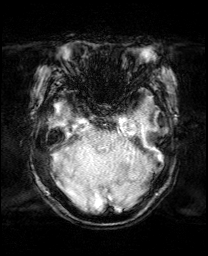
[im 35/52]
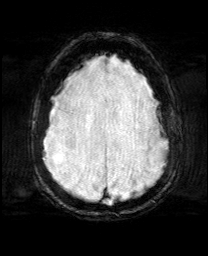
[im 52/52]
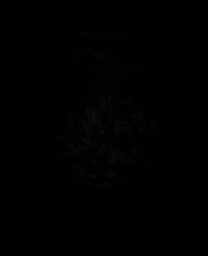

[Series 13: pha_images · axial · 3.0mm · 0.90mm/px · z∈[-120,+22]mm · 4 of 49 slices shown]
[im 1/49]
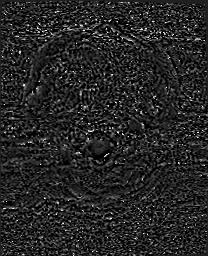
[im 17/49]
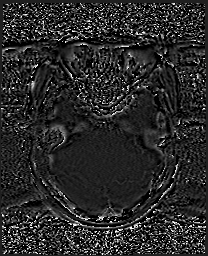
[im 33/49]
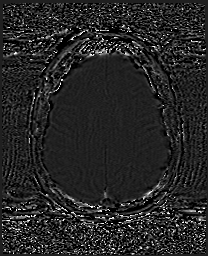
[im 49/49]
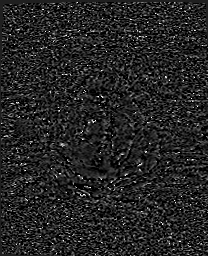

[Series 14: swi_images · axial · 3.0mm · 0.90mm/px · z∈[-123,+28]mm · 4 of 52 slices shown]
[im 1/52]
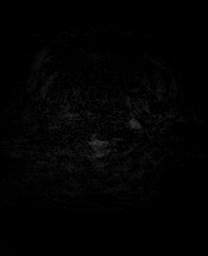
[im 18/52]
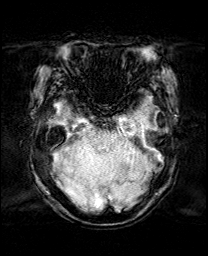
[im 35/52]
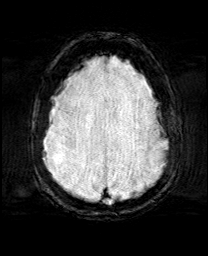
[im 52/52]
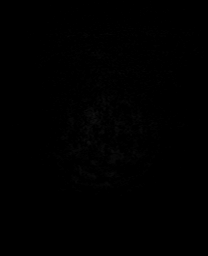

[Series 15: mip_images(sw) · axial · 24.0mm · 0.90mm/px · z∈[-113,+18]mm · 4 of 45 slices shown]
[im 1/45]
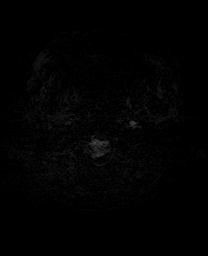
[im 15/45]
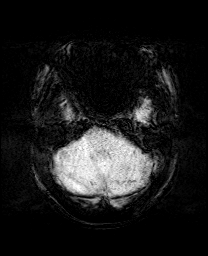
[im 30/45]
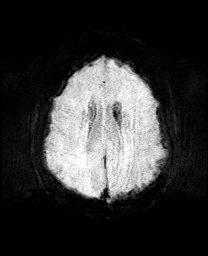
[im 45/45]
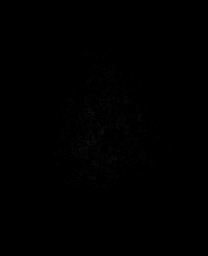

[Series 17: T2 · coronal · 5.0mm · 0.34mm/px · 2 of 29 slices shown (2 of 2)]
[im 1/29]
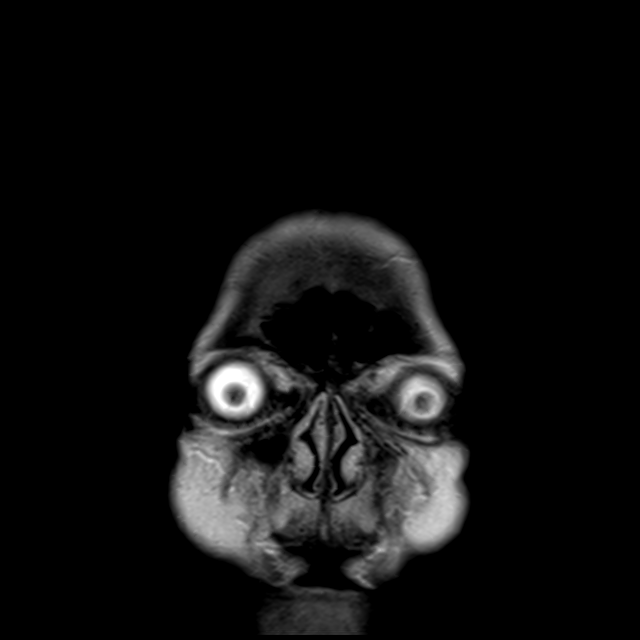
[im 29/29]
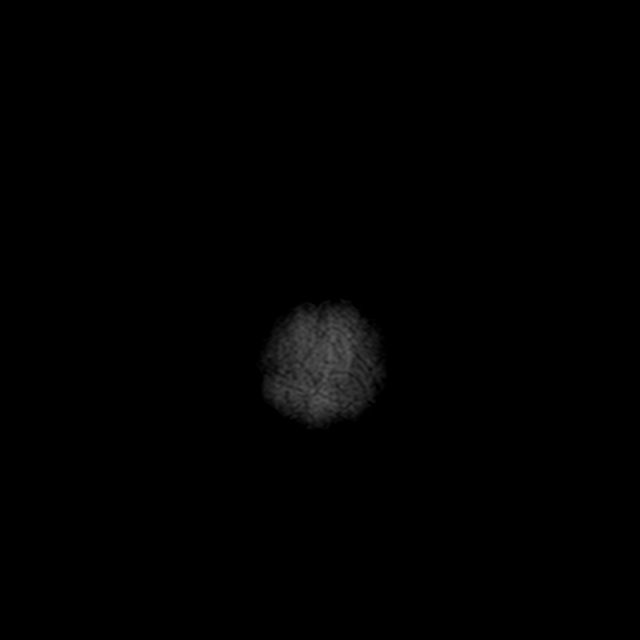

[44 of 48 positions shown; findings below may reference images not displayed]

FINDINGS: Brain: Small focus of mild diffusion hyperintensity at the level of
the inferior left basal ganglia (series 5, image 73).

No evidence of intracranial hemorrhage. There is no intracranial
mass or mass effect. There is no hydrocephalus or extra-axial fluid
collection. Ventricles and sulci are stable in size and
configuration.

Chronic infarcts of the inferior right frontal lobe and right
parietooccipital and posterior temporal lobes. Additional patchy
foci of T2 hyperintensity in the supratentorial white matter
nonspecific may reflect mild chronic microvascular ischemic changes.
Probable small superimposed chronic infarcts of the central white
matter and deep gray nuclei. A focus of susceptibility is again
noted along the medial left temporal lobe and likely reflects
chronic microhemorrhage.

Vascular: Major vessel flow voids at the skull base are preserved.

Skull and upper cervical spine: Normal marrow signal is preserved.

Sinuses/Orbits: Minor mucosal thickening.  Orbits are unremarkable.

Other: Sella is unremarkable.  Minor mastoid fluid opacification.
IMPRESSION: Probable small subacute infarct of the inferior left basal ganglia.

Chronic infarcts and chronic microvascular ischemic changes similar
to prior study.

## 2022-05-25 MED ORDER — FUROSEMIDE 10 MG/ML IJ SOLN
80.0000 mg | Freq: Two times a day (BID) | INTRAMUSCULAR | Status: AC
  Administered 2022-05-25 (×2): 80 mg via INTRAVENOUS
  Filled 2022-05-25 (×3): qty 8

## 2022-05-25 NOTE — Progress Notes (Signed)
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    Patient was recently admitted 12/25/21 - 12/27/21 for CHF exacerbation - diuresed with Lasix IV 40 mg BID with total of -4.58 L volume removed.  Echo in December 2022 showed improvement in LVEF 40-45% (25-30% in 02/2021) with global hypokinesis, normal RV, and moderately elevated pulmonary pressures.  Discharge weight was 162 lbs.  Discharged on carvedilol 6.25 mg BID and furosemide 40 mg daily and was instructed to stop amlodipine 10 mg daily and BiDIl 20-37.5 mg TID.   On 05/24, she presented to her Cardiology office thinking she had an appointment and endorsed SOB, bilateral LEE, and reduced activity tolerance; MD referred her to [facility] ED.  Patient endorsed progressive symptoms despite recent increase Lasix 40 mg daily > BID at cardiology visit 05/09 where she reported worsening LEE, SOB, orthopnea, and PND.  CXR in the ED showed moderate R pleural effusion; dual PPM evident.  Repeat CXR 6h later demonstrated improvement in R pleural effusion, cardiomegaly and no evidence of interstitial edema.  Underwent R thoracentesis with 1.3 L removed.  She was started on IV diuresis 05/24, received Lasix IV 40 mg x1 and 60 mg x2 with marginal response , but her 1+ bilateral LE pitting edema was improved.    Breathing had improved on 05/25 s/p thoracentesis and diuresis, but renal function worsened (SCr 2.13 > 2.37, baseline ~1.6) and patient reported dizziness so diuretics were held.  Echo on 05/25 showed improvement in LVEF to 50-55% and elevated LVEDP (21) with new G2DD; still with normal RV and mildly elevated pulmonary pressures.    Patient continued to endorse SOB, feeling  swollen and fatigued w/ bilateral 2+ pitting LEE and JVD over the weekend (05/27-28).  She had minimal response of 550cc to Lasix IV 40 mg BID and was given 80 mg x1; nephrology also consulted and redosed 80 mg x1 the following day after ~1.4 L uop and worsening renal function (SCr 3.0 > 3.19).    She had a near syncopal event on 05/30 while sitting on the bed to perform orthostatics, was SOB while speaking and appeared more volume overloaded on exam.  JVD and bilateral 2+ pitting edema still present - nephrology increased Lasix to 80 mg IV BID.  She was taken for a MRI of brain 05/30 which revealed a probable small subacute infarct.    Current HF Medications: Diuretic: furosemide IV 80 mg x2 per nephrology Beta Blocker: carvedilol 6.25 mg BID Other: hydralazine 50 mg q8h + Imdur 15 mg daily  Prior to admission HF Medications: Diuretic: furosemide 40 mg BID Beta blocker: carvedilol 6.25 mg BID Other: hydralazine 25 mg BID + Imdur 15 mg daily ; potassium 20 mEq BID  Pertinent Lab Values: Serum creatinine 3.19 > 3, BUN 36, Potassium 4.7, Sodium 132, BNP 2083.9, Magnesium 2.6, A1c 9.1% (up from 7.7% in Jan)  Vital Signs: Weight: 214 lbs (admission weight: 150 lbs - not accurate) Blood pressure: 140-60/60-90 Heart rate: 60s - Afib I/O: -1.25L yesterday; net -2.86L   Medication Assistance / Insurance Benefits Check: Does the patient have prescription insurance?  Yes Type of insurance plan: [name] [employer]  Does the patient qualify for medication assistance through manufacturers or  grants?   Yes Eligible grants and/or patient assistance programs: pending Medication assistance applications in progress: pending  Medication assistance applications approved: pending  Outpatient Pharmacy:  Prior to admission outpatient pharmacy: [facility] Is the patient willing to use [facility] at discharge? Pending Is the patient willing to transition their outpatient pharmacy to utilize a [facility]  outpatient pharmacy?   Pending    Assessment: 1. 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 NYHA class II-III symptoms. - Diuresing with Lasix IV 80 mg x2 per renal  - Continue PTA carvediolol 6.25 mg BID - No ACEi/ARB/ARNI with AKI - No MRA with AKI - No SGLT2i with h/o UTI during March-April 2022 admission and A1c up 7.7% > 9.1% since Jan - Continue hydralazine 50 mg q8h + Imdur 15 mg daily    Plan: 1) Medication changes recommended at this time: - Continue regimen as above - Maintain Mg > 2 and K > 4 - Stop PTA amlodipine on discharge to allow for BP room with GDMT initiation/titration pending renal function improvement  2) Patient assistance: [name] = $47  3)  Education  - To be completed prior to discharge  Laurey Arrow, PharmD PGY1 Pharmacy Resident 05/25/2022  3:00 PM

## 2022-05-25 NOTE — Progress Notes (Signed)
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 Underlying CKD likely related to HTN and DM -Kidney function fortunately stable today especially with diuresis. 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 Would hold off on biopsy for now especially given resp status and I can't foresee her laying flat for a biopsy. Management at this time would not change if we were to do a biopsy. Can certainly consider this as an outpatient -Avoid nephrotoxic medications including NSAIDs and iodinated intravenous contrast exposure unless the latter is absolutely indicated.  Preferred narcotic agents for pain control are hydromorphone, fentanyl, and methadone. Morphine should not be used. Avoid Baclofen and avoid oral sodium phosphate and magnesium citrate based laxatives / bowel preps. Continue strict Input and Output monitoring. Will monitor the patient closely with you and intervene or adjust therapy as indicated by changes in clinical status/labs   Acute on chronic sCHF exacerbation -good response to lasix, will redose as above -cardiology following  Dizziness/syncope -check orthostats -per primary  HTN -BP w/ reasonable control  Pleural effusion secondary to CHF -s/p rt thora 5/24  Choleycystitis -per primary service  Anemia -hgb at goal for CKD  DM2 w/ hyperglycemia -DM mgmt per primary service  Subjective:   Patient seen and examined this AM. Had syncope overnight and a near syncopal event when orthostats were trying to be checked. She reports that after she was laid down flat, started having worsening SOB, slightly improved with raising HOB. Uop ~1.2L    Objective:   BP (!) 144/74 (BP Location: Left Arm)   Pulse 63   Temp 98.8 F (37.1 C) (Oral)   Resp 20   Ht '5\' 6"'$  (1.676 m)   Wt 97.3 kg   SpO2 97%   BMI 34.62 kg/m   Intake/Output Summary (Last 24 hours) at 05/25/2022 6387 Last data filed at 05/25/2022 0600 Gross per 24 hour  Intake 1074 ml  Output 1925 ml  Net -851 ml   Weight change: 5.9 kg  Physical Exam: Gen: in distress secondary to SOB CVS:s1s2, rrr Resp:decreased breath sounds bibasilar, no overt w/r/r/c FIE:PPIR Ext:2+ pitting edema b/l LEs Neuro: awake, alert, no asterixis  Imaging: DG CHEST PORT 1 VIEW  Result Date: 05/23/2022 CLINICAL DATA:  Shortness of breath EXAM: PORTABLE CHEST 1 VIEW COMPARISON:  Chest radiograph dated May 19, 2022 FINDINGS: The heart is enlarged. Atherosclerotic calcification of the aortic arch. Left access pacemaker leads are unchanged. Interval increase in hazy opacities in bilateral lung bases suggesting pleural effusions and/or atelectasis. IMPRESSION: 1. Interval increase in bilateral lower lobe hazy opacities suggesting pleural effusion and/or atelectasis. 2.  Cardiomediastinal silhouette is unchanged. Electronically Signed   By: Keane Police D.O.   On: 05/23/2022 10:18    Labs: BMET Recent Labs  Lab 05/19/22 1040 05/20/22 0438 05/21/22 0256 05/22/22 0450 05/23/22 0329 05/24/22 0207 05/25/22 0411  NA 139 137 136 137 136 135 132*  K 3.9 4.1 4.3 4.3 4.8 4.6 4.7  CL 108 107 103 105 105 101 101  CO2 '22 23 24 24 24 25 22  '$ GLUCOSE 236* 250* 303* 192* 202* 230* 151*  BUN '15 19 21 '$ 25* 30* 34* 36*  CREATININE 2.13* 2.37* 2.70* 2.79*  3.00* 3.19* 3.01*  CALCIUM 8.9 8.3* 8.0* 8.0* 8.2* 8.2* 8.2*  PHOS  --   --   --   --   --  4.4 4.7*   CBC Recent Labs  Lab 05/21/22 0256 05/22/22 0450 05/23/22 0329 05/24/22 0207 05/25/22 0411  WBC 6.3 6.7 7.4 6.7 7.3  NEUTROABS 4.2  --  4.9 4.5 4.8  HGB 10.4* 10.3* 11.0* 10.8* 10.3*  HCT 32.8* 33.3* 34.8* 33.3* 32.7*  MCV 90.1 91.5 91.3  90.0 90.1  PLT 293 318 319 327 334    Medications:     apixaban  5 mg Oral BID   carvedilol  6.25 mg Oral BID WC   docusate sodium  100 mg Oral BID   feeding supplement (GLUCERNA SHAKE)  237 mL Oral TID BM   furosemide  80 mg Intravenous BID   hydrALAZINE  50 mg Oral Q8H   insulin aspart  0-15 Units Subcutaneous TID WC   insulin aspart  0-5 Units Subcutaneous QHS   insulin glargine-yfgn  12 Units Subcutaneous Daily   isosorbide mononitrate  15 mg Oral Daily   multivitamin with minerals  1 tablet Oral Daily   sodium chloride flush  3 mL Intravenous Q12H      Gean Quint, MD [facility] 05/25/2022, 7:21 AM

## 2022-05-25 NOTE — Progress Notes (Signed)
Patient to MRI from 3E23 with SWOT RN Juliann Pulse for MRI brain wo contrast. Patient has medtronic device. CLE sent. Orders received for DOO 85. Will re-program once scan is completed.

## 2022-05-25 NOTE — Progress Notes (Signed)
Progress Note    [facility]   XYV:[id number]  DOB: 10/26/1951  DOA: 05/19/2022     5 PCP: Coolidge Breeze, FNP  Initial CC: Shortness of breath  Hospital Course: [facility] is a 71 y.o. female with PMH cholecystitis with cholecystostomy tube in place (initially placed 03/19/2021), afib, HTN, DMII, sCHF, CVA who presented with shortness of breath.  She also had reported increased lower extremity swelling/overload with no improvement on her home Lasix. She underwent thoracentesis on 05/19/2022 removing 1.3 L.  Initially Lasix was held after developing some dizziness. Respiratory status improved after thoracentesis.    She also was evaluated by IR due to difficulty with cholecystostomy tube drainage.  Tube was evaluated and noted intact however suture had been removed but tube had not been retracted any.  After forceful flushing, biliary output was achieved with no further issues.  Interval History:  Patient became dizzy overnight and MRI brain was ordered.  Urine output has picked up over the past couple days in general.  She still appears to be anxious and dyspneic at times.  Assessment and Plan: * Acute on chronic combined systolic and diastolic congestive heart failure (HCC) - Moderate to large right-sided pleural effusion on admission along with worsening dyspnea/SOB, increased lower extremity edema and no improvement with home Lasix -Underwent thoracentesis on 5/24 with removal of 1.3 L -Initially Lasix held due to some dizziness and worsening renal function however lower extremity edema persisted and lasix restarted 5/27 - some improvement in SOB today; did diurese better with higher dose lasix; continued on lasix (dosing per nephrology/cardiology at this time) - cardiology and nephrology following  - continue strict I&O  Dizziness - Patient complained of recurrent dizziness with orthostasis overnight - She had been resumed back on Lasix these past couple days - suspicion  for etiology possibly overall deconditioning/anxiety vs orthostasis vs neurogenic - given recurrence, will follow up with MRI brain -Follow-up orthostatics  Acute renal failure superimposed on stage 4 chronic kidney disease ([place]) - patient has history of CKD4. Baseline creat ~ 1.6 - 1.9, eGFR 25 -Lasix restarted on 05/22/2022 - Etiology possibly due to CRS from underlying CHF; ATN also a possibility from hypoxia on admission - bladder scan negative for retention; voiding well - nephrology consulted given worsening renal function; no indication currently to warrant HD - possible outpt kidney biopsy given nephrotic range proteinuria  - continue monitoring output  Pleural effusion due to CHF (congestive heart failure) (HCC)-resolved as of 05/22/2022 - Due to underlying CHF - S/p right thoracentesis on 05/19/2022, removing 1.3 L  Acute respiratory failure with hypoxia (HCC) - Due to underlying pleural effusion initially on admission and volume overload - Continue Lasix - Wean oxygen off as able.  Not on home O2 - CXR shows worsening edema on 5/28  Cholecystitis, chronic - Diagnosed with acute cholecystitis March 2022.  Underwent cholecystostomy tube placement on 03/19/2021 - Tube output had decreased on admission and underwent flushing with radiology with return of function -Continue flushing drain each shift and document output  Physical deconditioning - Evaluated by PT/OT - Recommendations are for acute inpatient rehab if approved - Follow-up pending CIR evaluation  History of CVA (cerebrovascular accident) - Continue Eliquis  Paroxysmal atrial fibrillation ([place]) - Continue Coreg and Eliquis  Uncontrolled type 2 diabetes mellitus with hyperglycemia, with long-term current use of insulin (HCC) - Last A1c 7.7% - Continue SSI and CBG monitoring  Hypertension - Continue Coreg, hydralazine, Imdur    Old  records reviewed in assessment of this patient  Antimicrobials:   DVT  prophylaxis:   apixaban (ELIQUIS) tablet 5 mg   Code Status:   Code Status: Full Code  Disposition Plan: Pending CIR evaluation Status is: Inpatient  Objective: Blood pressure (!) 144/74, pulse 63, temperature 98.8 F (37.1 C), temperature source Oral, resp. rate 20, height $RemoveBe'5\' 6"'hhDUChxvl$  (1.676 m), weight 97.3 kg, SpO2 97 %.  Examination:  Physical Exam Constitutional:      General: She is not in acute distress.    Appearance: She is well-developed.     Comments: Anxious appearing  HENT:     Head: Normocephalic and atraumatic.     Mouth/Throat:     Mouth: Mucous membranes are moist.  Eyes:     Extraocular Movements: Extraocular movements intact.  Cardiovascular:     Rate and Rhythm: Normal rate and regular rhythm.  Pulmonary:     Comments: Ongoing dull right-sided breath sounds; mild wheezing noted Abdominal:     General: Bowel sounds are normal. There is no distension.     Palpations: Abdomen is soft.     Comments: Expected tenderness to palpation around cholecystostomy tube which is chronic.  Biliary fluid appreciated in bag  Musculoskeletal:        General: Swelling present.     Cervical back: Normal range of motion and neck supple.     Comments: 2+ bilateral lower extremity pitting edema (slight improvement)  Skin:    General: Skin is warm and dry.  Neurological:     General: No focal deficit present.     Mental Status: She is alert.  Psychiatric:        Mood and Affect: Mood normal.        Behavior: Behavior normal.     Consultants:  Cardiology IR Nephrology   Procedures:  Right thoracentesis, 05/19/2022 Biliary drain flushing per IR, 05/21/2022  Data Reviewed: Results for orders placed or performed during the hospital encounter of 05/19/22 (from the past 24 hour(s))  Glucose, capillary     Status: Abnormal   Collection Time: 05/24/22  3:45 PM  Result Value Ref Range   Glucose-Capillary 261 (H) 70 - 99 mg/dL  Glucose, capillary     Status: Abnormal    Collection Time: 05/24/22  8:15 PM  Result Value Ref Range   Glucose-Capillary 274 (H) 70 - 99 mg/dL   Comment 1 Notify RN    Comment 2 Document in Chart   CBC with Differential/Platelet     Status: Abnormal   Collection Time: 05/25/22  4:11 AM  Result Value Ref Range   WBC 7.3 4.0 - 10.5 K/uL   RBC 3.63 (L) 3.87 - 5.11 MIL/uL   Hemoglobin 10.3 (L) 12.0 - 15.0 g/dL   HCT 32.7 (L) 36.0 - 46.0 %   MCV 90.1 80.0 - 100.0 fL   MCH 28.4 26.0 - 34.0 pg   MCHC 31.5 30.0 - 36.0 g/dL   RDW 15.7 (H) 11.5 - 15.5 %   Platelets 334 150 - 400 K/uL   nRBC 0.0 0.0 - 0.2 %   Neutrophils Relative % 66 %   Neutro Abs 4.8 1.7 - 7.7 K/uL   Lymphocytes Relative 17 %   Lymphs Abs 1.3 0.7 - 4.0 K/uL   Monocytes Relative 9 %   Monocytes Absolute 0.7 0.1 - 1.0 K/uL   Eosinophils Relative 7 %   Eosinophils Absolute 0.5 0.0 - 0.5 K/uL   Basophils Relative 1 %   Basophils Absolute  0.1 0.0 - 0.1 K/uL   Immature Granulocytes 0 %   Abs Immature Granulocytes 0.01 0.00 - 0.07 K/uL  Renal function panel     Status: Abnormal   Collection Time: 05/25/22  4:11 AM  Result Value Ref Range   Sodium 132 (L) 135 - 145 mmol/L   Potassium 4.7 3.5 - 5.1 mmol/L   Chloride 101 98 - 111 mmol/L   CO2 22 22 - 32 mmol/L   Glucose, Bld 151 (H) 70 - 99 mg/dL   BUN 36 (H) 8 - 23 mg/dL   Creatinine, Ser 3.01 (H) 0.44 - 1.00 mg/dL   Calcium 8.2 (L) 8.9 - 10.3 mg/dL   Phosphorus 4.7 (H) 2.5 - 4.6 mg/dL   Albumin 2.1 (L) 3.5 - 5.0 g/dL   GFR, Estimated 16 (L) >60 mL/min   Anion gap 9 5 - 15  Magnesium     Status: Abnormal   Collection Time: 05/25/22  4:11 AM  Result Value Ref Range   Magnesium 2.6 (H) 1.7 - 2.4 mg/dL  Glucose, capillary     Status: Abnormal   Collection Time: 05/25/22  5:46 AM  Result Value Ref Range   Glucose-Capillary 164 (H) 70 - 99 mg/dL   Comment 1 Notify RN    Comment 2 Document in Chart   Glucose, capillary     Status: Abnormal   Collection Time: 05/25/22 11:28 AM  Result Value Ref Range    Glucose-Capillary 181 (H) 70 - 99 mg/dL    I have Reviewed nursing notes, Vitals, and Lab results since pt's last encounter. Pertinent lab results : see above I have ordered test including BMP, CBC, Mg I have reviewed the last note from staff over past 24 hours I have discussed pt's care plan and test results with nursing staff, case manager   LOS: 5 days   Dwyane Dee, MD [facility] 05/25/2022, 1:50 PM

## 2022-05-25 NOTE — Progress Notes (Signed)
MD ordered to give hydralazine '50mg'$ . Patients blood pressure is 118/59 HR 61.

## 2022-05-25 NOTE — Progress Notes (Signed)
Pt about passed out again while sitting on the bed to do orthostatics, 30 seconds of sitting on the bed caused her to get dizzy and have a near syncope event, on call MD is aware.  Pt may benefit with anti-dizzy med, Will continue to monitor, Thanks Arvella Nigh RN.

## 2022-05-25 NOTE — Progress Notes (Signed)
Patient is alert and oriented x4 and consented for RN to speak to son. Spoke to [place], son and provided on update on current plan of care. Son made aware of MRI scan today and CIR as disposition. Patient's son verbalize understanding and has no questions for RN.

## 2022-05-25 NOTE — Plan of Care (Signed)
  Problem: Cardiac: Goal: Ability to achieve and maintain adequate cardiopulmonary perfusion will improve Outcome: Progressing   Problem: Education: Goal: Knowledge of General Education information will improve Description: Including pain rating scale, medication(s)/side effects and non-pharmacologic comfort measures Outcome: Progressing   Problem: Activity: Goal: Capacity to carry out activities will improve Outcome: Progressing

## 2022-05-25 NOTE — Progress Notes (Signed)
Informed of MRI for today.   Device system confirmed to be MRI conditional, with implant date > 6 weeks ago, and no evidence of abandoned or epicardial leads in review of most recent CXR Interrogation from today reviewed, pt is currently AP-VS at 63 bpm Change device settings for MRI to DOO at 85 bpm  Tachy-therapies to off if applicable.  Program device back to pre-MRI settings after completion of exam.  Annamaria Helling  05/25/2022 2:47 PM

## 2022-05-25 NOTE — Plan of Care (Signed)
  Problem: Clinical Measurements: Goal: Will remain free from infection Outcome: Completed/Met   Problem: Nutrition: Goal: Adequate nutrition will be maintained Outcome: Completed/Met   Problem: Pain Managment: Goal: General experience of comfort will improve Outcome: Completed/Met   

## 2022-05-25 NOTE — Assessment & Plan Note (Signed)
-   Patient complained of recurrent dizziness with orthostasis overnight - She had been resumed back on Lasix these past couple days - suspicion for etiology possibly overall deconditioning/anxiety vs orthostasis vs neurogenic - given recurrence, will follow up with MRI brain -Follow-up orthostatics

## 2022-05-25 NOTE — Progress Notes (Signed)
Progress Note  Patient Name: Tracey Bryant Date of Encounter: 05/25/2022  [facility] Cardiologist: Minus Breeding, MD   Subjective   Had near syncope overnight, but also continues to have orthopnea.Dyspneic with extended conversation today. She is in good spirits but frustrated that she is not feeling better.  Inpatient Medications    Scheduled Meds:  apixaban  5 mg Oral BID   carvedilol  6.25 mg Oral BID WC   docusate sodium  100 mg Oral BID   feeding supplement (GLUCERNA SHAKE)  237 mL Oral TID BM   furosemide  80 mg Intravenous BID   hydrALAZINE  50 mg Oral Q8H   insulin aspart  0-15 Units Subcutaneous TID WC   insulin aspart  0-5 Units Subcutaneous QHS   insulin glargine-yfgn  12 Units Subcutaneous Daily   isosorbide mononitrate  15 mg Oral Daily   multivitamin with minerals  1 tablet Oral Daily   sodium chloride flush  3 mL Intravenous Q12H   Continuous Infusions:  PRN Meds: acetaminophen **OR** acetaminophen, bisacodyl, hydrALAZINE, HYDROmorphone (DILAUDID) injection, ondansetron **OR** ondansetron (ZOFRAN) IV, oxyCODONE, polyethylene glycol, traZODone   Vital Signs    Vitals:   05/24/22 2000 05/24/22 2037 05/25/22 0600 05/25/22 0611  BP: (!) 151/71 136/70 129/72 (!) 144/74  Pulse: 60 79 60 63  Resp:   20   Temp:   98.8 F (37.1 C)   TempSrc:   Oral   SpO2:   97%   Weight:    97.3 kg  Height:        Intake/Output Summary (Last 24 hours) at 05/25/2022 1216 Last data filed at 05/25/2022 0859 Gross per 24 hour  Intake 778 ml  Output 1675 ml  Net -897 ml      05/25/2022    6:11 AM 05/24/2022    6:02 AM 05/23/2022    4:00 AM  Last 3 Weights  Weight (lbs) 214 lb 8.1 oz 201 lb 8 oz 206 lb 2.1 oz  Weight (kg) 97.3 kg 91.4 kg 93.5 kg      Telemetry    Predominantly a paced/v sensed - Personally Reviewed  ECG    No new since 5/24 - Personally Reviewed  Physical Exam   GEN: Well nourished, well developed in no acute distress NECK: JVD visible  low neck sitting upright CARDIAC: regular rhythm, normal S1 and S2, no rubs or gallops. No murmur. VASCULAR: Radial pulses 2+ bilaterally.  RESPIRATORY:  Clear to auscultation in upper fields, diminished bilateral bases ABDOMEN: Soft, non-tender, non-distended MUSCULOSKELETAL:  Moves all 4 limbs independently SKIN: Warm and dry, bilateral firm pitting 2+ LE edema NEUROLOGIC:  No focal neuro deficits noted. PSYCHIATRIC:  Normal affect    Labs    High Sensitivity Troponin:  No results for input(s): TROPONINIHS in the last 720 hours.   Chemistry Recent Labs  Lab 05/21/22 0256 05/22/22 0450 05/23/22 0329 05/24/22 0207 05/25/22 0411  NA 136   < > 136 135 132*  K 4.3   < > 4.8 4.6 4.7  CL 103   < > 105 101 101  CO2 24   < > '24 25 22  '$ GLUCOSE 303*   < > 202* 230* 151*  BUN 21   < > 30* 34* 36*  CREATININE 2.70*   < > 3.00* 3.19* 3.01*  CALCIUM 8.0*   < > 8.2* 8.2* 8.2*  MG  --   --  2.6* 2.7* 2.6*  PROT 5.3*  --   --   --   --  ALBUMIN 1.9*  --   --  2.1* 2.1*  AST 11*  --   --   --   --   ALT 9  --   --   --   --   ALKPHOS 56  --   --   --   --   BILITOT 0.5  --   --   --   --   GFRNONAA 18*   < > 16* 15* 16*  ANIONGAP 9   < > '7 9 9   '$ < > = values in this interval not displayed.    Lipids No results for input(s): CHOL, TRIG, HDL, LABVLDL, LDLCALC, CHOLHDL in the last 168 hours.  Hematology Recent Labs  Lab 05/23/22 0329 05/24/22 0207 05/25/22 0411  WBC 7.4 6.7 7.3  RBC 3.81* 3.70* 3.63*  HGB 11.0* 10.8* 10.3*  HCT 34.8* 33.3* 32.7*  MCV 91.3 90.0 90.1  MCH 28.9 29.2 28.4  MCHC 31.6 32.4 31.5  RDW 15.6* 15.6* 15.7*  PLT 319 327 334   Thyroid No results for input(s): TSH, FREET4 in the last 168 hours.  BNP Recent Labs  Lab 05/19/22 1040  BNP 2,083.9*    DDimer No results for input(s): DDIMER in the last 168 hours.   Radiology    No results found.  Cardiac Studies   Echo 05/20/22 1. Left ventricular ejection fraction, by estimation, is 50 to 55%. The   left ventricle has low normal function. The left ventricle has no regional  wall motion abnormalities. Left ventricular diastolic parameters are  consistent with Grade II diastolic  dysfunction (pseudonormalization). Elevated left ventricular end-diastolic  pressure. The E/e' is 21.   2. Right ventricular systolic function is normal. The right ventricular  size is mildly enlarged. There is mildly elevated pulmonary artery  systolic pressure. The estimated right ventricular systolic pressure is  14.7 mmHg.   3. Left atrial size was mildly dilated.   4. The mitral valve is abnormal. Trivial mitral valve regurgitation.   5. The aortic valve is tricuspid. Aortic valve regurgitation is not  visualized.   6. The inferior vena cava is normal in size with <50% respiratory  variability, suggesting right atrial pressure of 8 mmHg.   Comparison(s): Changes from prior study are noted. 12/26/2021: LVEF  40-45%, RVSP 45.8 mmHg.   Patient Profile     71 y.o. female with PMH paroxysmal atrial fibrillation, hypertension, type II diabetes, prior chronic systolic and diastolic heart failure, history of CVA, CKD stage 3b seen for acute on chronic systolic and diastolic heart failure  Assessment & Plan    Acute on chronic systolic and diastolic heart failure (current EF 50-55%), with previously reduced EF (25-30% -> 40-45%) Pleural effusions, s/p thoracentesis Acute kidney injury on chronic kidney disease stage 3b Type II diabetes -admission weight 68 kg, suspect inaccurate. Next weight 91.6 kg, current weight 97.3 kg.  -Cr 3.01 -appreciate nephrology input. She appears more volume up today than she did two days ago. Has some cloudy yellow urine in the canister.  -continue carvedilol -on amlodipine at home, on hold -continue hydralazine, imdur -when Cr improves, if GFR consistently >20 consider SGLT2i  Atrial fibrillation, paroxysmal S/P dual chamber PPM (Medtronic) -CHA2DS2/VAS Stroke Risk  Points=7  -continue apixaban -has chronic anemia, slightly below baseline, but no apparent active bleeding. Jehovah's witness, would not want transfusion  Overall while she is in good spirits, she actually looks more volume up to me and is conversationally dyspneic. Appreciate nephrology management of  diuretics.  For questions or updates, please contact [place] Please consult [email or web address] for contact info under     Signed, Buford Dresser, MD  05/25/2022, 12:16 PM

## 2022-05-25 NOTE — Progress Notes (Signed)
Inpatient Rehab Admissions Coordinator:    I do not have insurance auth or a CIR bed for this Pt. I continue to follow for potential admit pending insurance auth.   Clemens Catholic, [place], [place] Admissions Coordinator  [phone] ([place]) [phone] (office)

## 2022-05-25 NOTE — Progress Notes (Signed)
Inpatient Diabetes Program Recommendations  AACE/ADA: New Consensus Statement on Inpatient Glycemic Control (2015)  Target Ranges:  Prepandial:   less than 140 mg/dL      Peak postprandial:   less than 180 mg/dL (1-2 hours)      Critically ill patients:  140 - 180 mg/dL   Lab Results  Component Value Date   GLUCAP 164 (H) 05/25/2022   HGBA1C 9.4 (H) 05/21/2022    Review of Glycemic Control  Latest Reference Range & Units 05/24/22 06:04 05/24/22 11:04 05/24/22 11:27 05/24/22 15:45 05/24/22 20:15 05/25/22 05:46  Glucose-Capillary 70 - 99 mg/dL 187 (H) 200 (H) 220 (H) 261 (H) 274 (H) 164 (H)   Diabetes history: DM 2 Outpatient Diabetes medications:  Glucotrol 5 mg daily (not taking) Current orders for Inpatient glycemic control:  Semglee 12 units daily Novolog moderate tid with meals Glucerna tid between meals   Inpatient Diabetes Program Recommendations  Consider increasing Semglee to 15 units daily and possibly add Novolog meal coverage 2 units tid with meals (hold if patient eats less than 50% or NPO).  Thanks,  Adah Perl, RN, BC-ADM Inpatient Diabetes Coordinator Pager [phone]  (8a-5p)

## 2022-05-26 ENCOUNTER — Inpatient Hospital Stay (HOSPITAL_COMMUNITY): Payer: Medicare HMO

## 2022-05-26 ENCOUNTER — Ambulatory Visit: Payer: Medicare HMO | Admitting: Physician Assistant

## 2022-05-26 DIAGNOSIS — J9601 Acute respiratory failure with hypoxia: Secondary | ICD-10-CM | POA: Diagnosis not present

## 2022-05-26 DIAGNOSIS — N170 Acute kidney failure with tubular necrosis: Secondary | ICD-10-CM | POA: Diagnosis not present

## 2022-05-26 DIAGNOSIS — I509 Heart failure, unspecified: Secondary | ICD-10-CM | POA: Diagnosis not present

## 2022-05-26 DIAGNOSIS — E1165 Type 2 diabetes mellitus with hyperglycemia: Secondary | ICD-10-CM

## 2022-05-26 DIAGNOSIS — R42 Dizziness and giddiness: Secondary | ICD-10-CM | POA: Diagnosis not present

## 2022-05-26 DIAGNOSIS — K819 Cholecystitis, unspecified: Secondary | ICD-10-CM | POA: Diagnosis not present

## 2022-05-26 DIAGNOSIS — N179 Acute kidney failure, unspecified: Secondary | ICD-10-CM | POA: Diagnosis not present

## 2022-05-26 DIAGNOSIS — Z794 Long term (current) use of insulin: Secondary | ICD-10-CM

## 2022-05-26 DIAGNOSIS — I5043 Acute on chronic combined systolic (congestive) and diastolic (congestive) heart failure: Secondary | ICD-10-CM | POA: Diagnosis not present

## 2022-05-26 DIAGNOSIS — N184 Chronic kidney disease, stage 4 (severe): Secondary | ICD-10-CM | POA: Diagnosis not present

## 2022-05-26 DIAGNOSIS — Z8673 Personal history of transient ischemic attack (TIA), and cerebral infarction without residual deficits: Secondary | ICD-10-CM

## 2022-05-26 DIAGNOSIS — K811 Chronic cholecystitis: Secondary | ICD-10-CM

## 2022-05-26 LAB — URINALYSIS, ROUTINE W REFLEX MICROSCOPIC
Bilirubin Urine: NEGATIVE
Glucose, UA: NEGATIVE mg/dL
Hgb urine dipstick: NEGATIVE
Ketones, ur: NEGATIVE mg/dL
Nitrite: NEGATIVE
Protein, ur: 100 mg/dL — AB
Specific Gravity, Urine: 1.011 (ref 1.005–1.030)
pH: 5 (ref 5.0–8.0)

## 2022-05-26 LAB — RENAL FUNCTION PANEL
Albumin: 2.1 g/dL — ABNORMAL LOW (ref 3.5–5.0)
Anion gap: 8 (ref 5–15)
BUN: 40 mg/dL — ABNORMAL HIGH (ref 8–23)
CO2: 27 mmol/L (ref 22–32)
Calcium: 8.2 mg/dL — ABNORMAL LOW (ref 8.9–10.3)
Chloride: 101 mmol/L (ref 98–111)
Creatinine, Ser: 3.1 mg/dL — ABNORMAL HIGH (ref 0.44–1.00)
GFR, Estimated: 16 mL/min — ABNORMAL LOW (ref >60)
Glucose, Bld: 141 mg/dL — ABNORMAL HIGH (ref 70–99)
Phosphorus: 5.9 mg/dL — ABNORMAL HIGH (ref 2.5–4.6)
Potassium: 4.8 mmol/L (ref 3.5–5.1)
Sodium: 136 mmol/L (ref 135–145)

## 2022-05-26 LAB — CBC WITH DIFFERENTIAL/PLATELET
Abs Immature Granulocytes: 0.01 10*3/uL (ref 0.00–0.07)
Basophils Absolute: 0 10*3/uL (ref 0.0–0.1)
Basophils Relative: 1 %
Eosinophils Absolute: 0.4 10*3/uL (ref 0.0–0.5)
Eosinophils Relative: 7 %
HCT: 31.5 % — ABNORMAL LOW (ref 36.0–46.0)
Hemoglobin: 10.1 g/dL — ABNORMAL LOW (ref 12.0–15.0)
Immature Granulocytes: 0 %
Lymphocytes Relative: 20 %
Lymphs Abs: 1.2 10*3/uL (ref 0.7–4.0)
MCH: 28.9 pg (ref 26.0–34.0)
MCHC: 32.1 g/dL (ref 30.0–36.0)
MCV: 90 fL (ref 80.0–100.0)
Monocytes Absolute: 0.5 10*3/uL (ref 0.1–1.0)
Monocytes Relative: 9 %
Neutro Abs: 3.8 10*3/uL (ref 1.7–7.7)
Neutrophils Relative %: 63 %
Platelets: 287 10*3/uL (ref 150–400)
RBC: 3.5 MIL/uL — ABNORMAL LOW (ref 3.87–5.11)
RDW: 15.6 % — ABNORMAL HIGH (ref 11.5–15.5)
WBC: 5.9 10*3/uL (ref 4.0–10.5)
nRBC: 0 % (ref 0.0–0.2)

## 2022-05-26 LAB — LIPID PANEL
Cholesterol: 99 mg/dL (ref 0–200)
HDL: 36 mg/dL — ABNORMAL LOW (ref >40)
LDL Cholesterol: 52 mg/dL (ref 0–99)
Total CHOL/HDL Ratio: 2.8 RATIO
Triglycerides: 54 mg/dL (ref <150)
VLDL: 11 mg/dL (ref 0–40)

## 2022-05-26 LAB — GLUCOSE, CAPILLARY
Glucose-Capillary: 133 mg/dL — ABNORMAL HIGH (ref 70–99)
Glucose-Capillary: 212 mg/dL — ABNORMAL HIGH (ref 70–99)
Glucose-Capillary: 167 mg/dL — ABNORMAL HIGH (ref 70–99)
Glucose-Capillary: 215 mg/dL — ABNORMAL HIGH (ref 70–99)

## 2022-05-26 LAB — HEMOGLOBIN A1C
Hgb A1c MFr Bld: 9 % — ABNORMAL HIGH (ref 4.8–5.6)
Mean Plasma Glucose: 211.6 mg/dL

## 2022-05-26 LAB — MAGNESIUM: Magnesium: 2.8 mg/dL — ABNORMAL HIGH (ref 1.7–2.4)

## 2022-05-26 IMAGING — DX DG CHEST 1V PORT
1 series · 1 of 1 positions shown · non-contrast
Comparison: [DATE]

CLINICAL DATA: Shortness of breath

EXAM:
PORTABLE CHEST 1 VIEW

[chest ap]
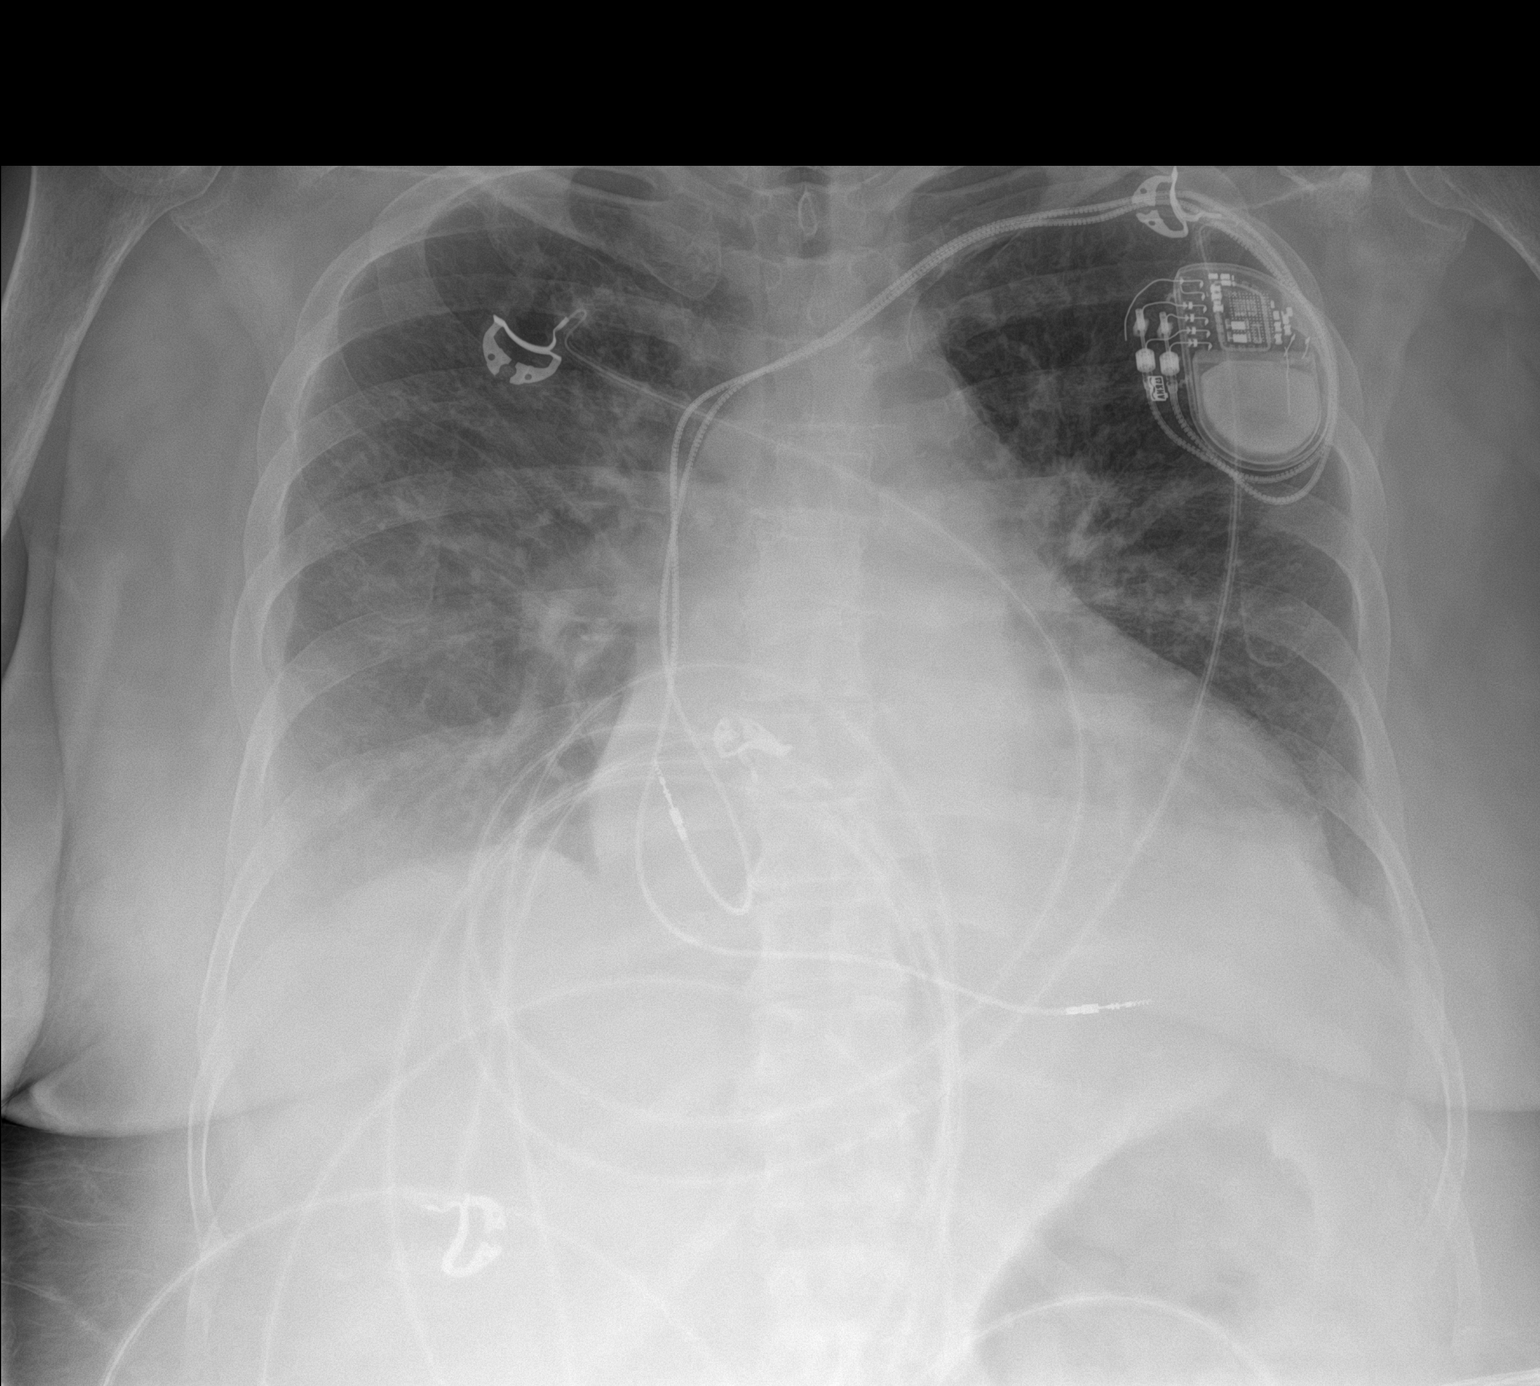

[1 of 1 positions shown; findings below may reference images not displayed]

FINDINGS: Left-sided implanted cardiac device. Stable mild cardiomegaly.
Aortic atherosclerosis. Moderate right and small left pleural
effusions with associated bibasilar opacities. No pneumothorax.
IMPRESSION: Moderate right and small left pleural effusions with associated
bibasilar opacities, likely atelectasis.

## 2022-05-26 MED ORDER — FUROSEMIDE 10 MG/ML IJ SOLN
100.0000 mg | Freq: Once | INTRAVENOUS | Status: AC
  Administered 2022-05-26: 100 mg via INTRAVENOUS
  Filled 2022-05-26: qty 10

## 2022-05-26 MED ORDER — FUROSEMIDE 10 MG/ML IJ SOLN
100.0000 mg | Freq: Once | INTRAMUSCULAR | Status: AC
  Administered 2022-05-26: 100 mg via INTRAVENOUS
  Filled 2022-05-26: qty 10

## 2022-05-26 NOTE — Progress Notes (Signed)
Progress Note  Patient Name: Tracey Bryant Date of Encounter: 05/26/2022  [facility] Cardiologist: Minus Breeding, MD   Subjective   No acute overnight events. Patient continues to be incredibly dyspneic and gets short of breath even while talking to the point where she has to pause. She does not feel like her breathing is any better than yesterday. No chest pain. She had just over 1 L of urine output yesterday with 2 dose of IV Lasix '80mg'$ . She reports another episode of lightheadedness/dizzy this morning when nursing staff was moving her in bed - was also acute short of breath at the time.  Inpatient Medications    Scheduled Meds:  apixaban  5 mg Oral BID   carvedilol  6.25 mg Oral BID WC   docusate sodium  100 mg Oral BID   feeding supplement (GLUCERNA SHAKE)  237 mL Oral TID BM   hydrALAZINE  50 mg Oral Q8H   insulin aspart  0-15 Units Subcutaneous TID WC   insulin aspart  0-5 Units Subcutaneous QHS   insulin glargine-yfgn  12 Units Subcutaneous Daily   isosorbide mononitrate  15 mg Oral Daily   multivitamin with minerals  1 tablet Oral Daily   sodium chloride flush  3 mL Intravenous Q12H   Continuous Infusions:  PRN Meds: acetaminophen **OR** acetaminophen, bisacodyl, hydrALAZINE, HYDROmorphone (DILAUDID) injection, ondansetron **OR** ondansetron (ZOFRAN) IV, oxyCODONE, polyethylene glycol, traZODone   Vital Signs    Vitals:   05/25/22 1656 05/25/22 2000 05/25/22 2241 05/26/22 0419  BP: 130/81 (!) 130/57 (!) 118/59 113/60  Pulse: 65 63 61 61  Resp: '17 17 20 19  '$ Temp:  98.7 F (37.1 C)  98.1 F (36.7 C)  TempSrc:  Oral  Oral  SpO2: 100%   100%  Weight:    91.1 kg  Height:        Intake/Output Summary (Last 24 hours) at 05/26/2022 0756 Last data filed at 05/26/2022 0705 Gross per 24 hour  Intake 580 ml  Output 1775 ml  Net -1195 ml      05/26/2022    4:19 AM 05/25/2022    6:11 AM 05/24/2022    6:02 AM  Last 3 Weights  Weight (lbs) 200 lb 13.4 oz 214  lb 8.1 oz 201 lb 8 oz  Weight (kg) 91.1 kg 97.3 kg 91.4 kg      Telemetry    AV paced with rates in the 60s to 70s. - Personally Reviewed  ECG    No new ECG tracing today. - Personally Reviewed  Physical Exam   GEN: Caucasian female with No acute distress.   Neck: JVD elevated. Cardiac: RRR. No murmurs, rubs, or gallops.  Respiratory: Tachypneic with labored breathing while talking. Diminished breath sounds on the right. Possible mild crackles in left base.  GI: Soft, non-distended, and non-tender. MS: 2+ pitting edema of bilateral lower extremities. No deformity. Skin: Warm and ry. Neuro:  No focal deficits. Psych: Normal affect. Responds appropriately.  Labs    High Sensitivity Troponin:  No results for input(s): TROPONINIHS in the last 720 hours.   Chemistry Recent Labs  Lab 05/21/22 0256 05/22/22 0450 05/24/22 0207 05/25/22 0411 05/26/22 0313  NA 136   < > 135 132* 136  K 4.3   < > 4.6 4.7 4.8  CL 103   < > 101 101 101  CO2 24   < > '25 22 27  '$ GLUCOSE 303*   < > 230* 151* 141*  BUN 21   < >  34* 36* 40*  CREATININE 2.70*   < > 3.19* 3.01* 3.10*  CALCIUM 8.0*   < > 8.2* 8.2* 8.2*  MG  --    < > 2.7* 2.6* 2.8*  PROT 5.3*  --   --   --   --   ALBUMIN 1.9*  --  2.1* 2.1* 2.1*  AST 11*  --   --   --   --   ALT 9  --   --   --   --   ALKPHOS 56  --   --   --   --   BILITOT 0.5  --   --   --   --   GFRNONAA 18*   < > 15* 16* 16*  ANIONGAP 9   < > '9 9 8   '$ < > = values in this interval not displayed.    Lipids  Recent Labs  Lab 05/26/22 0313  CHOL 99  TRIG 54  HDL 36*  LDLCALC 52  CHOLHDL 2.8    Hematology Recent Labs  Lab 05/24/22 0207 05/25/22 0411 05/26/22 0313  WBC 6.7 7.3 5.9  RBC 3.70* 3.63* 3.50*  HGB 10.8* 10.3* 10.1*  HCT 33.3* 32.7* 31.5*  MCV 90.0 90.1 90.0  MCH 29.2 28.4 28.9  MCHC 32.4 31.5 32.1  RDW 15.6* 15.7* 15.6*  PLT 327 334 287   Thyroid No results for input(s): TSH, FREET4 in the last 168 hours.  BNP Recent Labs  Lab  05/19/22 1040  BNP 2,083.9*    DDimer No results for input(s): DDIMER in the last 168 hours.   Radiology    MR BRAIN WO CONTRAST  Result Date: 05/25/2022 CLINICAL DATA:  Neuro deficit, acute, stroke suspected EXAM: MRI HEAD WITHOUT CONTRAST TECHNIQUE: Multiplanar, multiecho pulse sequences of the brain and surrounding structures were obtained without intravenous contrast. COMPARISON:  04/06/2021 FINDINGS: Brain: Small focus of mild diffusion hyperintensity at the level of the inferior left basal ganglia (series 5, image 73). No evidence of intracranial hemorrhage. There is no intracranial mass or mass effect. There is no hydrocephalus or extra-axial fluid collection. Ventricles and sulci are stable in size and configuration. Chronic infarcts of the inferior right frontal lobe and right parietooccipital and posterior temporal lobes. Additional patchy foci of T2 hyperintensity in the supratentorial white matter nonspecific may reflect mild chronic microvascular ischemic changes. Probable small superimposed chronic infarcts of the central white matter and deep gray nuclei. A focus of susceptibility is again noted along the medial left temporal lobe and likely reflects chronic microhemorrhage. Vascular: Major vessel flow voids at the skull base are preserved. Skull and upper cervical spine: Normal marrow signal is preserved. Sinuses/Orbits: Minor mucosal thickening.  Orbits are unremarkable. Other: Sella is unremarkable.  Minor mastoid fluid opacification. IMPRESSION: Probable small subacute infarct of the inferior left basal ganglia. Chronic infarcts and chronic microvascular ischemic changes similar to prior study. Electronically Signed   By: Macy Mis M.D.   On: 05/25/2022 15:49    Cardiac Studies   Echocardiogram 05/20/2022: Impressions: 1. Left ventricular ejection fraction, by estimation, is 50 to 55%. The  left ventricle has low normal function. The left ventricle has no regional  wall  motion abnormalities. Left ventricular diastolic parameters are  consistent with Grade II diastolic  dysfunction (pseudonormalization). Elevated left ventricular end-diastolic  pressure. The E/e' is 21.   2. Right ventricular systolic function is normal. The right ventricular  size is mildly enlarged. There is mildly elevated pulmonary artery  systolic pressure. The estimated right ventricular systolic pressure is  16.0 mmHg.   3. Left atrial size was mildly dilated.   4. The mitral valve is abnormal. Trivial mitral valve regurgitation.   5. The aortic valve is tricuspid. Aortic valve regurgitation is not  visualized.   6. The inferior vena cava is normal in size with <50% respiratory  variability, suggesting right atrial pressure of 8 mmHg.   Comparison(s): Changes from prior study are noted. 12/26/2021: LVEF  40-45%, RVSP 45.8 mmHg.    Patient Profile     72 y.o. female with a history of chronic combined CHF with EF previously as low as 25-30% but improved to 40-45% on Echo in 11/2021 and 50-55% on Echo this admission, paroxysmal atrial fibrillation on Eliquis, hypertension, type 2 diabetes mellitus, and CKD stage IV who was admitted on 05/19/2022 with acute on chronic combined CHF.  Assessment & Plan    Acute on Chronic Combined CHF BNP 2,083. Initial chest x-ray showed interval progression of right base collapse/consolidation with moderate right pleural effusion. Repeat chest x-ray on 05/23/2022 showed interval increase in bilateral lower lobe hazy opacities suggesting pleural effusion and/or atelectasis. Echo this admission showed LVEF of 50-55% with grade 2 diastolic dysfunction, mildly enlarged RV with normal systolic function, and mildly elevated RVSP of 38.2 mmHg. Patient has been diuresed with IV Lasix this admission but it has been limited by renal function. Therefore, Nephrology was consulted and is assisting with this. Net negative 4.1 L this admission. Creatinine slightly up  from yesterday. Don't think weight from yesterday was accurate. - Still incredibly dyspneic and volume overloaded on exam. - Continue diuresis per Nephrology.  - Continue Coreg 6.'25mg'$  twice daily. - Continue Hydralazine '50mg'$  three times daily and Imdur '15mg'$  daily. - No ACEi/ARB/ARNI or MRA due to renal function. - Continue to monitor daily weights, strict I/Os, and renal function closely.  Right Pleural Effusion S/p right thoracentesis on 5/24 with removal of 1.3L of fluid. - Continues to be very dyspneic with decreased breath sounds on the right. - Will repeat chest x-ray today.  Paroxysmal Atrial Fibrillation S/p Dual Chamber [employer] PPM Telemetry shows AV paced rhythm. - Continue Coreg 6.'25mg'$  twice daily. - Continue Eliquis '5mg'$  twice daily.  Near Syncope Patient had another episode of lightheadedness/dizziness this morning while shifting in bed. Brain MRI yesterday showed probable small subacute infarct of the inferior left basal ganglia. - Management per primary team.   Hypertension BP mostly well controlled. - Continue medications for CHF as above.  Acute on CKD Stage IV Creatinine 3.10 today (slightly up from 3.01 yesterday). Prior baseline ranged from 1.6 to 2.5. - Nephrology is following.  Otherwise, per primary team: - Type 2 diabetes mellitus - Anemia - Chronic cholecystitis s/p cholecystostomy tube   For questions or updates, please contact [facility] Please consult [email or web address] for contact info under        Signed, Darreld Mclean, PA-C  05/26/2022, 7:56 AM

## 2022-05-26 NOTE — Progress Notes (Signed)
Physical Therapy Treatment Patient Details Name: Tracey Bryant MRN: [id number] DOB: 1951-01-24 Today's Date: 05/26/2022   History of Present Illness The pt is a 71 yo female presenting from home 5/24 with SOB and BLE swelling. Found to have acute CHF exacerbation and R pleural effusion s/p thoracentesis 5/24. PMH includes: arthritis, CHF, DM II, DVT, HTN, afib, and stroke.    PT Comments    Patient able to progress to [facility] today despite symptoms upon sitting up with LOB posterior and to L and stated "passing out".  Never truly had LOC and BP was higher than supine.  She was able to sit on [facility] for vestibular evaluation.  Noted patient postive for new sub acute basal ganglia infarct on MRI.  She does have difficulty with target maintenance during VOR and so would have been difficult to do head thrust test.  She also did not any nystagmus with positional testing.  Orthostatics negative with increased BP sitting compared to supine.  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  Will benefit from vestibular rehab at [facility] setting.     Recommendations for follow up therapy are one component of a multi-disciplinary discharge planning process, led by the attending physician.  Recommendations may be updated based on patient status, additional functional criteria and insurance authorization.  Follow Up Recommendations  Skilled nursing-short term rehab (<3 hours/day)     Assistance Recommended at Discharge Frequent or constant Supervision/Assistance  Patient can return home with the following Two people to help with walking and/or transfers;Two people to help with bathing/dressing/bathroom;Assistance with cooking/housework;Direct supervision/assist for medications management;Direct supervision/assist for financial management;Assist for transportation;Help with stairs or ramp for entrance   Equipment Recommendations   None recommended by PT    Recommendations for Other Services       Precautions / Restrictions Precautions Precautions: Fall Precaution Comments: psuedo-syncopal episodes with supine to sit (hold onto her so she doesn't fall over)     Mobility  Bed Mobility Overal bed mobility: Needs Assistance Bed Mobility: Rolling, Sidelying to Sit, Sit to Supine Rolling: Min assist Sidelying to sit: Mod assist   Sit to supine: Min assist   General bed mobility comments: rail used to roll for assisting with bath as NT bathing pt, then side to sit with assist for balance pt with LOB immediately posterior and L after sitting up from R side needing help to prevent LOB    Transfers Overall transfer level: Needs assistance Equipment used: Rolling walker (2 wheels) Transfers: Bed to chair/wheelchair/BSC       Squat pivot transfers: Max assist     General transfer comment: pivot to [facility] to allow bed changeout since bed malfunction.  Assist for balance, safety and cues for hand placement on armrest; pivot back to bed with max A for balance again and lifting help and cues for using bed rail    Ambulation/Gait               General Gait Details: unable due to flailing while on EOB   Stairs             Wheelchair Mobility    Modified Rankin (Stroke Patients Only) Modified Rankin (Stroke Patients Only) Pre-Morbid Rankin Score: No significant disability Modified Rankin: Moderately severe disability     Balance Overall balance assessment: Needs assistance Sitting-balance support: Single extremity supported, No upper extremity supported, Feet supported Sitting balance-Leahy Scale: Fair Sitting balance - Comments:  sitting EOB initially mod A due to posterior and L lateral LOB, holding on after some time to footboard with S for balance.   Standing balance support: During functional activity Standing balance-Leahy Scale: Zero Standing balance comment: only up on her feel briefly  with max A                            Cognition Arousal/Alertness: Awake/alert Behavior During Therapy: WFL for tasks assessed/performed Overall Cognitive Status: Impaired/Different from baseline Area of Impairment: Memory, Safety/judgement, Awareness, Problem solving                     Memory: Decreased short-term memory   Safety/Judgement: Decreased awareness of safety, Decreased awareness of deficits Awareness: Intellectual Problem Solving: Decreased initiation, Difficulty sequencing, Requires verbal cues          Exercises      General Comments General comments (skin integrity, edema, etc.): NT in the room assisting with bath and needing to change out bed; Seated vestibular assessment performed on edge of bed.  Coordination testing slowed and dysmetria with FNF and with touching fingers to thumb.  toe taps slowed but [facility]   Vestibular Assessment - 05/26/22 0001       Symptom Behavior   Subjective history of current problem reports having symptoms of "dizziness" since this admission.  Feels like "falling out" "out of control" when she is moved especially lying to sitting.    Type of Dizziness  "World moves";Unsteady with head/body turns    Frequency of Dizziness Intermittent    Duration of Dizziness minutes    Symptom Nature Motion provoked;Intermittent    Aggravating Factors Turning body quickly;Turning head quickly;Supine to sit;Activity in general    Relieving Factors Closing eyes;Head stationary;Rest   supported sitting or laying   History of similar episodes May have had similar episode when in the hospital last year and had a stroke in the hospital.  Not sure if admission related to CHF.      Oculomotor Exam   Oculomotor Alignment Normal    Ocular ROM WNL    Spontaneous Comment    Gaze-induced  Comment;Age appropriate nystagmus at end range   couple beats of nystagmus with end gaze to R   Smooth Pursuits Intact   but increased vertigo symptoms    Saccades Slow;Undershoots      Oculomotor Exam-Fixation Suppressed    Left Head Impulse could not perform as pt not keeping eyes on target for assisted VOR      Vestibulo-Ocular Reflex   VOR 1 Head Only (x 1 viewing) with head rotation assist needing cues to keep eyes on target but could perform for few head turns (highly distractible); increased symptoms of vertigo      Auditory   Comments intact and equal bilateral to scratch test      Positional Testing   Horizontal Canal Testing Horizontal Canal Right;Horizontal Canal Left      Horizontal Canal Right   Horizontal Canal Right Duration 60 sec (while assisted with cleaning    Horizontal Canal Right Symptoms Normal      Horizontal Canal Left   Horizontal Canal Left Duration 30 sec    Horizontal Canal Left Symptoms Normal                  Pertinent Vitals/Pain Pain Assessment Faces Pain Scale: Hurts little more Pain Location: generalized with movement Pain Descriptors / Indicators: Aching Pain Intervention(s):  Monitored during session, Repositioned    Home Living                          Prior Function            PT Goals (current goals can now be found in the care plan section) Progress towards PT goals: Progressing toward goals (slowly)    Frequency    Min 3X/week      PT Plan Discharge plan needs to be updated    Co-evaluation              AM-PAC PT "6 Clicks" Mobility   Outcome Measure  Help needed turning from your back to your side while in a flat bed without using bedrails?: A Lot Help needed moving from lying on your back to sitting on the side of a flat bed without using bedrails?: Total Help needed moving to and from a bed to a chair (including a wheelchair)?: Total Help needed standing up from a chair using your arms (e.g., wheelchair or bedside chair)?: Total Help needed to walk in hospital room?: Total   6 Click Score: 6    End of Session Equipment Utilized During  Treatment: Gait belt Activity Tolerance: Patient limited by fatigue Patient left: in bed;with call bell/phone within reach;with nursing/sitter in room   PT Visit Diagnosis: Other abnormalities of gait and mobility (R26.89);Muscle weakness (generalized) (M62.81)     Time: 4166-0630 PT Time Calculation (min) (ACUTE ONLY): 45 min  Charges:  $Therapeutic Activity: 23-37 mins $Neuromuscular Re-education: 8-22 mins                     Magda Kiel, PT Acute Rehabilitation Services ZSWFU:[phone] Office:[phone] 05/26/2022    Reginia Naas 05/26/2022, 4:31 PM

## 2022-05-26 NOTE — Progress Notes (Signed)
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 Underlying CKD likely related to HTN and DM -Kidney function fortunately stable today especially with diuresis. Nonoliguric -will redose her lasix today: '100mg'$  x 1 dose today. 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 Would hold off on biopsy for now especially given resp status and I can't foresee her laying flat for a biopsy. Management at this time would not change if we were to do a biopsy. Can certainly consider this as an outpatient, see below -Avoid nephrotoxic medications including NSAIDs and iodinated intravenous contrast exposure unless the latter is absolutely indicated.  Preferred narcotic agents for pain control are hydromorphone, fentanyl, and methadone. Morphine should not be used. Avoid Baclofen and avoid oral sodium phosphate and magnesium citrate based laxatives / bowel preps. Continue strict Input and Output monitoring. Will monitor the patient closely with you and intervene or adjust therapy as indicated by changes in clinical status/labs   Acute on chronic sCHF exacerbation -cardiology following -diuresis as above  Dizziness/syncope -secondary to subacute basal ganglia infarct as seen on MRI? -check orthostats -per primary  HTN -BP w/ reasonable control  Proteinuria -suspecting this is more so related to DM -has seen rheum in the past-ruled out lupus. HIV neg in 07/2021 -will check secondary/serologic work up while she's here  Pleural effusion secondary to CHF -s/p rt thora 5/24  Choleycystitis -per primary service  Anemia -hgb at goal  for CKD  DM2 w/ hyperglycemia -DM mgmt per primary service  Subjective:   Patient seen and examined this AM. She reports that her breathing is unchanged as compared to yesterday, received lasix '80mg'$  iv bid yesterday. Tried to sit up earlier with assistance which resulted in dizziness again. Still very orthopneic. Denies any chest pain, n/v, loss of appetite, dysgeusia, brain fog, new shakes/tremors Uop ~1L (w/ 1 unmeasured void) She is net neg ~4L since admit.   Objective:   BP 113/60 (BP Location: Left Arm)   Pulse 61   Temp 98.1 F (36.7 C) (Oral)   Resp 19   Ht '5\' 6"'$  (1.676 m)   Wt 91.1 kg   SpO2 100%   BMI 32.42 kg/m   Intake/Output Summary (Last 24 hours) at 05/26/2022 0626 Last data filed at 05/26/2022 9485 Gross per 24 hour  Intake 700 ml  Output 1775 ml  Net -1075 ml   Weight change: -6.2 kg  Physical Exam: Gen: NAD, laying flat in bed CVS:s1s2, rrr Resp:decreased breath sounds bibasilar, no overt w/r/r/c, unable to speak in full sentences IOE:VOJJ Ext:2+ pitting edema b/l LEs Neuro: awake, alert, no asterixis  Imaging: MR BRAIN WO CONTRAST  Result Date: 05/25/2022 CLINICAL DATA:  Neuro deficit, acute, stroke suspected EXAM: MRI HEAD WITHOUT CONTRAST TECHNIQUE: Multiplanar, multiecho pulse sequences of the brain and surrounding structures were obtained without intravenous contrast. COMPARISON:  04/06/2021 FINDINGS: Brain: Small focus of mild diffusion hyperintensity at the level of the inferior left basal ganglia (series 5, image 73). No evidence of intracranial hemorrhage. There is no intracranial mass or mass effect. There is no hydrocephalus or extra-axial fluid collection. Ventricles and sulci are stable in size and configuration. Chronic infarcts of the inferior  right frontal lobe and right parietooccipital and posterior temporal lobes. Additional patchy foci of T2 hyperintensity in the supratentorial white matter nonspecific may reflect mild chronic microvascular  ischemic changes. Probable small superimposed chronic infarcts of the central white matter and deep gray nuclei. A focus of susceptibility is again noted along the medial left temporal lobe and likely reflects chronic microhemorrhage. Vascular: Major vessel flow voids at the skull base are preserved. Skull and upper cervical spine: Normal marrow signal is preserved. Sinuses/Orbits: Minor mucosal thickening.  Orbits are unremarkable. Other: Sella is unremarkable.  Minor mastoid fluid opacification. IMPRESSION: Probable small subacute infarct of the inferior left basal ganglia. Chronic infarcts and chronic microvascular ischemic changes similar to prior study. Electronically Signed   By: Macy Mis M.D.   On: 05/25/2022 15:49    Labs: BMET Recent Labs  Lab 05/20/22 3291 05/21/22 0256 05/22/22 0450 05/23/22 0329 05/24/22 0207 05/25/22 0411 05/26/22 0313  NA 137 136 137 136 135 132* 136  K 4.1 4.3 4.3 4.8 4.6 4.7 4.8  CL 107 103 105 105 101 101 101  CO2 '23 24 24 24 25 22 27  '$ GLUCOSE 250* 303* 192* 202* 230* 151* 141*  BUN 19 21 25* 30* 34* 36* 40*  CREATININE 2.37* 2.70* 2.79* 3.00* 3.19* 3.01* 3.10*  CALCIUM 8.3* 8.0* 8.0* 8.2* 8.2* 8.2* 8.2*  PHOS  --   --   --   --  4.4 4.7* 5.9*   CBC Recent Labs  Lab 05/23/22 0329 05/24/22 0207 05/25/22 0411 05/26/22 0313  WBC 7.4 6.7 7.3 5.9  NEUTROABS 4.9 4.5 4.8 3.8  HGB 11.0* 10.8* 10.3* 10.1*  HCT 34.8* 33.3* 32.7* 31.5*  MCV 91.3 90.0 90.1 90.0  PLT 319 327 334 287    Medications:     apixaban  5 mg Oral BID   carvedilol  6.25 mg Oral BID WC   docusate sodium  100 mg Oral BID   feeding supplement (GLUCERNA SHAKE)  237 mL Oral TID BM   hydrALAZINE  50 mg Oral Q8H   insulin aspart  0-15 Units Subcutaneous TID WC   insulin aspart  0-5 Units Subcutaneous QHS   insulin glargine-yfgn  12 Units Subcutaneous Daily   isosorbide mononitrate  15 mg Oral Daily   multivitamin with minerals  1 tablet Oral Daily   sodium chloride flush   3 mL Intravenous Q12H      Gean Quint, MD [facility] 05/26/2022, 8:33 AM

## 2022-05-26 NOTE — Progress Notes (Addendum)
MD ordered to give hydralazine '50mg'$ . Patients blood pressure is 118/59 HR 61.

## 2022-05-26 NOTE — Consult Note (Addendum)
Stroke Neurology Consultation Note  Consult Requested by: Dr. Tana Coast  Reason for Consult: subacute infarct in left basal ganglia  Consult Date:  05/26/22  The history was obtained from the patient and chart.  During history and examination, all items were  able to obtain unless otherwise noted.  History of Present Illness:  Tracey Bryant is an 71 y.o. Caucasian female with PMH of cholecystitis, atrial fibrillation, HTN, DM, CHF and stroke presenting with dizziness with sitting up in bed.  Patient stated that dizziness only occurred when she was sitting or moving in bed and resolved when she was at rest.  She denied unilateral weakness, difficulty speaking or other symptoms.  MRI reveled small subacute left basal ganglia infarct which does not explain her symptoms of dizziness.  Patient also had several chronic infarcts seen on MRI.  TNK was not given as she was outside of the window with an unclear last known well time, and thrombectomy was not offered as she had no LVO.  Date last known well: Date: 05/25/2022 Time last known well: Unable to determine tPA Given: No: out of window, subacute stroke MRS:  4 NIHSS:  0  Past Medical History:  Diagnosis Date   Arthritis    Chronic systolic (congestive) heart failure (HCC)    Diabetes mellitus without complication (HCC)    DVT (deep venous thrombosis) ([place]) 07/26/2021   Hypertension    Paroxysmal atrial fibrillation ([place]) 07/26/2021   Stroke [facility])      Past Surgical History:  Procedure Laterality Date   breast tumor     IR CATHETER TUBE CHANGE  09/25/2021   IR CHOLANGIOGRAM EXISTING TUBE  05/13/2021   IR EXCHANGE BILIARY DRAIN  07/27/2021   IR EXCHANGE BILIARY DRAIN  11/04/2021   IR EXCHANGE BILIARY DRAIN  01/08/2022   IR RADIOLOGIST EVAL & MGMT  09/04/2021   IR REMOVAL OF CALCULI/DEBRIS BILIARY DUCT/GB  09/25/2021   IR REMOVAL OF CALCULI/DEBRIS BILIARY DUCT/GB  11/04/2021   IR REMOVAL OF CALCULI/DEBRIS BILIARY DUCT/GB  01/08/2022   PACEMAKER  IMPLANT     TONSILLECTOMY      Family History  Problem Relation Age of Onset   Cancer Mother    Diabetes Mother    Heart disease Father      Social History:  reports that she has never smoked. She has never used smokeless tobacco. She reports that she does not drink alcohol and does not use drugs.  Review of Systems: A full ROS was attempted today and was  able to be performed.  Systems assessed include - Constitutional, Eyes, HENT, Respiratory, Cardiovascular, Gastrointestinal, Genitourinary, Integument/breast, Hematologic/lymphatic, Musculoskeletal, Neurological, Behavioral/Psych, Endocrine, Allergic/Immunologic - with pertinent responses as per HPI.  Allergies: No Known Allergies   Medications: Scheduled:  apixaban  5 mg Oral BID   carvedilol  6.25 mg Oral BID WC   docusate sodium  100 mg Oral BID   feeding supplement (GLUCERNA SHAKE)  237 mL Oral TID BM   hydrALAZINE  50 mg Oral Q8H   insulin aspart  0-15 Units Subcutaneous TID WC   insulin aspart  0-5 Units Subcutaneous QHS   insulin glargine-yfgn  12 Units Subcutaneous Daily   isosorbide mononitrate  15 mg Oral Daily   multivitamin with minerals  1 tablet Oral Daily   sodium chloride flush  3 mL Intravenous Q12H   Continuous:  furosemide 100 mg (05/26/22 1037)   IZT:IWPYKDXIPJASN **OR** acetaminophen, bisacodyl, hydrALAZINE, HYDROmorphone (DILAUDID) injection, ondansetron **OR** ondansetron (ZOFRAN) IV, oxyCODONE, polyethylene glycol,  traZODone  Test Results: CBC:  Recent Labs  Lab 05/25/22 0411 05/26/22 0313  WBC 7.3 5.9  NEUTROABS 4.8 3.8  HGB 10.3* 10.1*  HCT 32.7* 31.5*  MCV 90.1 90.0  PLT 334 353   Basic Metabolic Panel:  Recent Labs  Lab 05/25/22 0411 05/26/22 0313  NA 132* 136  K 4.7 4.8  CL 101 101  CO2 22 27  GLUCOSE 151* 141*  BUN 36* 40*  CREATININE 3.01* 3.10*  CALCIUM 8.2* 8.2*  MG 2.6* 2.8*  PHOS 4.7* 5.9*   Liver Function Tests: Recent Labs  Lab 05/21/22 0256 05/24/22 0207  05/25/22 0411 05/26/22 0313  AST 11*  --   --   --   ALT 9  --   --   --   ALKPHOS 56  --   --   --   BILITOT 0.5  --   --   --   PROT 5.3*  --   --   --   ALBUMIN 1.9* 2.1* 2.1* 2.1*   No results for input(s): LIPASE, AMYLASE in the last 168 hours. No results for input(s): AMMONIA in the last 168 hours. Coagulation Studies: No results for input(s): LABPROT, INR in the last 72 hours. Cardiac Enzymes: No results for input(s): CKTOTAL, CKMB, CKMBINDEX, TROPONINI in the last 168 hours. BNP: Invalid input(s): POCBNP CBG:  Recent Labs  Lab 05/25/22 0546 05/25/22 1128 05/25/22 1545 05/25/22 2108 05/26/22 0615  GLUCAP 164* 181* 154* 163* 133*   Urinalysis:  Recent Labs  Lab 05/23/22 1600 05/26/22 0717  COLORURINE YELLOW YELLOW  LABSPEC 1.006 1.011  PHURINE 6.0 5.0  GLUCOSEU NEGATIVE NEGATIVE  HGBUR NEGATIVE NEGATIVE  BILIRUBINUR NEGATIVE NEGATIVE  KETONESUR NEGATIVE NEGATIVE  PROTEINUR 100* 100*  NITRITE NEGATIVE NEGATIVE  LEUKOCYTESUR LARGE* MODERATE*   Microbiology:  Results for orders placed or performed during the hospital encounter of 12/25/21  Resp Panel by RT-PCR (Flu A&B, Covid) Nasopharyngeal Swab     Status: None   Collection Time: 12/25/21  8:46 PM   Specimen: Nasopharyngeal Swab; Nasopharyngeal(NP) swabs in vial transport medium  Result Value Ref Range Status   SARS Coronavirus 2 by RT PCR NEGATIVE NEGATIVE Final    Comment: (NOTE) SARS-CoV-2 target nucleic acids are NOT DETECTED.  The SARS-CoV-2 RNA is generally detectable in upper respiratory specimens during the acute phase of infection. The lowest concentration of SARS-CoV-2 viral copies this assay can detect is 138 copies/mL. A negative result does not preclude SARS-Cov-2 infection and should not be used as the sole basis for treatment or other patient management decisions. A negative result may occur with  improper specimen collection/handling, submission of specimen other than nasopharyngeal swab,  presence of viral mutation(s) within the areas targeted by this assay, and inadequate number of viral copies(<138 copies/mL). A negative result must be combined with clinical observations, patient history, and epidemiological information. The expected result is Negative.  Fact Sheet for Patients:  EntrepreneurPulse.com.au  Fact Sheet for Healthcare Providers:  IncredibleEmployment.be  This test is no t yet approved or cleared by the [place] FDA and  has been authorized for detection and/or diagnosis of SARS-CoV-2 by FDA under an Emergency Use Authorization (EUA). This EUA will remain  in effect (meaning this test can be used) for the duration of the COVID-19 declaration under Section 564(b)(1) of the Act, 21 U.S.C.section 360bbb-3(b)(1), unless the authorization is terminated  or revoked sooner.       Influenza A by PCR NEGATIVE NEGATIVE Final   Influenza B  by PCR NEGATIVE NEGATIVE Final    Comment: (NOTE) The Xpert Xpress SARS-CoV-2/FLU/RSV plus assay is intended as an aid in the diagnosis of influenza from Nasopharyngeal swab specimens and should not be used as a sole basis for treatment. Nasal washings and aspirates are unacceptable for Xpert Xpress SARS-CoV-2/FLU/RSV testing.  Fact Sheet for Patients: EntrepreneurPulse.com.au  Fact Sheet for Healthcare Providers: IncredibleEmployment.be  This test is not yet approved or cleared by the [place] FDA and has been authorized for detection and/or diagnosis of SARS-CoV-2 by FDA under an Emergency Use Authorization (EUA). This EUA will remain in effect (meaning this test can be used) for the duration of the COVID-19 declaration under Section 564(b)(1) of the Act, 21 U.S.C. section 360bbb-3(b)(1), unless the authorization is terminated or revoked.  Performed at [facility], [address], [place], [place] [address]   MRSA Next Gen by PCR,  Nasal     Status: None   Collection Time: 12/26/21  8:07 AM   Specimen: Nasal Mucosa; Nasal Swab  Result Value Ref Range Status   MRSA by PCR Next Gen NOT DETECTED NOT DETECTED Final    Comment: (NOTE) The GeneXpert MRSA Assay (FDA approved for NASAL specimens only), is one component of a comprehensive MRSA colonization surveillance program. It is not intended to diagnose MRSA infection nor to guide or monitor treatment for MRSA infections. Test performance is not FDA approved in patients less than 40 years old. Performed at [facility], [address] [place], [place] [address]    Lipid Panel:     Component Value Date/Time   CHOL 99 05/26/2022 0313   TRIG 54 05/26/2022 0313   HDL 36 (L) 05/26/2022 0313   CHOLHDL 2.8 05/26/2022 0313   VLDL 11 05/26/2022 0313   LDLCALC 52 05/26/2022 0313   HgbA1c:  Lab Results  Component Value Date   HGBA1C 9.0 (H) 05/26/2022   Urine Drug Screen: No results found for: LABOPIA, COCAINSCRNUR, LABBENZ, AMPHETMU, THCU, LABBARB  Alcohol Level: No results for input(s): ETH in the last 168 hours.  DG Chest 2 View  Result Date: 05/19/2022 CLINICAL DATA:  Shortness of breath. EXAM: CHEST - 2 VIEW COMPARISON:  12/25/2021 FINDINGS: Low volume film. Interval progression of right base collapse/consolidation with moderate right pleural effusion. Minimal atelectasis or infiltrate noted left base with probable tiny left pleural effusion. The cardio pericardial silhouette is enlarged. Dual lead permanent pacemaker evident. IMPRESSION: Interval progression of right base collapse/consolidation with moderate right pleural effusion. Electronically Signed   By: Misty Stanley M.D.   On: 05/19/2022 11:07   MR BRAIN WO CONTRAST  Result Date: 05/25/2022 CLINICAL DATA:  Neuro deficit, acute, stroke suspected EXAM: MRI HEAD WITHOUT CONTRAST TECHNIQUE: Multiplanar, multiecho pulse sequences of the brain and surrounding structures were obtained without intravenous contrast.  COMPARISON:  04/06/2021 FINDINGS: Brain: Small focus of mild diffusion hyperintensity at the level of the inferior left basal ganglia (series 5, image 73). No evidence of intracranial hemorrhage. There is no intracranial mass or mass effect. There is no hydrocephalus or extra-axial fluid collection. Ventricles and sulci are stable in size and configuration. Chronic infarcts of the inferior right frontal lobe and right parietooccipital and posterior temporal lobes. Additional patchy foci of T2 hyperintensity in the supratentorial white matter nonspecific may reflect mild chronic microvascular ischemic changes. Probable small superimposed chronic infarcts of the central white matter and deep gray nuclei. A focus of susceptibility is again noted along the medial left temporal lobe and likely reflects chronic microhemorrhage.  Vascular: Major vessel flow voids at the skull base are preserved. Skull and upper cervical spine: Normal marrow signal is preserved. Sinuses/Orbits: Minor mucosal thickening.  Orbits are unremarkable. Other: Sella is unremarkable.  Minor mastoid fluid opacification. IMPRESSION: Probable small subacute infarct of the inferior left basal ganglia. Chronic infarcts and chronic microvascular ischemic changes similar to prior study. Electronically Signed   By: Macy Mis M.D.   On: 05/25/2022 15:49   DG Chest Port 1 View  Result Date: 05/26/2022 CLINICAL DATA:  Shortness of breath EXAM: PORTABLE CHEST 1 VIEW COMPARISON:  05/23/2022 FINDINGS: Left-sided implanted cardiac device. Stable mild cardiomegaly. Aortic atherosclerosis. Moderate right and small left pleural effusions with associated bibasilar opacities. No pneumothorax. IMPRESSION: Moderate right and small left pleural effusions with associated bibasilar opacities, likely atelectasis. Electronically Signed   By: Davina Poke D.O.   On: 05/26/2022 09:25   DG CHEST PORT 1 VIEW  Result Date: 05/23/2022 CLINICAL DATA:  Shortness of  breath EXAM: PORTABLE CHEST 1 VIEW COMPARISON:  Chest radiograph dated May 19, 2022 FINDINGS: The heart is enlarged. Atherosclerotic calcification of the aortic arch. Left access pacemaker leads are unchanged. Interval increase in hazy opacities in bilateral lung bases suggesting pleural effusions and/or atelectasis. IMPRESSION: 1. Interval increase in bilateral lower lobe hazy opacities suggesting pleural effusion and/or atelectasis. 2.  Cardiomediastinal silhouette is unchanged. Electronically Signed   By: Keane Police D.O.   On: 05/23/2022 10:18   DG CHEST PORT 1 VIEW  Result Date: 05/19/2022 CLINICAL DATA:  Shortness of breath, respiratory distress EXAM: PORTABLE CHEST 1 VIEW COMPARISON:  05/19/2022 at 1123 hours FINDINGS: Small right pleural effusion, improved. Left lung is clear. No frank interstitial edema. Cardiomegaly. Thoracic aortic atherosclerosis. Left subclavian pacemaker. IMPRESSION: Small right pleural effusion, improved. No frank interstitial edema. Electronically Signed   By: Julian Hy M.D.   On: 05/19/2022 17:32   ECHOCARDIOGRAM COMPLETE  Result Date: 05/20/2022    ECHOCARDIOGRAM REPORT   Patient Name:   ANIJA BRICKNER Date of Exam: 05/20/2022 Medical Rec #:  [id number]       Height:       66.0 in Accession #:    [id number]      Weight:       150.0 lb Date of Birth:  May 28, 1951       BSA:          1.770 m Patient Age:    10 years        BP:           126/67 mmHg Patient Gender: F               HR:           63 bpm. Exam Location:  Inpatient Procedure: 2D Echo, Cardiac Doppler and Color Doppler Indications:    CHF-Acute Systolic 829.56 / O13.08  History:        Patient has prior history of Echocardiogram examinations, most                 recent 12/26/2021. CHF, Stroke, Arrythmias:Atrial Fibrillation;                 Risk Factors:Hypertension and Diabetes.  Sonographer:    Bernadene Person RDCS Referring Phys: [place]  1. Left ventricular ejection fraction, by  estimation, is 50 to 55%. The left ventricle has low normal function. The left ventricle has no regional wall motion abnormalities. Left ventricular diastolic parameters are consistent with Grade II  diastolic dysfunction (pseudonormalization). Elevated left ventricular end-diastolic pressure. The E/e' is 21.  2. Right ventricular systolic function is normal. The right ventricular size is mildly enlarged. There is mildly elevated pulmonary artery systolic pressure. The estimated right ventricular systolic pressure is 79.8 mmHg.  3. Left atrial size was mildly dilated.  4. The mitral valve is abnormal. Trivial mitral valve regurgitation.  5. The aortic valve is tricuspid. Aortic valve regurgitation is not visualized.  6. The inferior vena cava is normal in size with <50% respiratory variability, suggesting right atrial pressure of 8 mmHg. Comparison(s): Changes from prior study are noted. 12/26/2021: LVEF 40-45%, RVSP 45.8 mmHg. FINDINGS  Left Ventricle: Left ventricular ejection fraction, by estimation, is 50 to 55%. The left ventricle has low normal function. The left ventricle has no regional wall motion abnormalities. The left ventricular internal cavity size was normal in size. There is no left ventricular hypertrophy. Left ventricular diastolic parameters are consistent with Grade II diastolic dysfunction (pseudonormalization). Elevated left ventricular end-diastolic pressure. The E/e' is 21. Right Ventricle: The right ventricular size is mildly enlarged. No increase in right ventricular wall thickness. Right ventricular systolic function is normal. There is mildly elevated pulmonary artery systolic pressure. The tricuspid regurgitant velocity is 2.75 m/s, and with an assumed right atrial pressure of 8 mmHg, the estimated right ventricular systolic pressure is 92.1 mmHg. Left Atrium: Left atrial size was mildly dilated. Right Atrium: Right atrial size was normal in size. Pericardium: There is no evidence of  pericardial effusion. Mitral Valve: The mitral valve is abnormal. There is mild calcification of the posterior mitral valve leaflet(s). Trivial mitral valve regurgitation. Tricuspid Valve: The tricuspid valve is grossly normal. Tricuspid valve regurgitation is trivial. Aortic Valve: The aortic valve is tricuspid. Aortic valve regurgitation is not visualized. Pulmonic Valve: The pulmonic valve was normal in structure. Pulmonic valve regurgitation is not visualized. Aorta: The aortic root and ascending aorta are structurally normal, with no evidence of dilitation. Venous: The inferior vena cava is normal in size with less than 50% respiratory variability, suggesting right atrial pressure of 8 mmHg. IAS/Shunts: No atrial level shunt detected by color flow Doppler.  LEFT VENTRICLE PLAX 2D LVIDd:         4.50 cm     Diastology LVIDs:         3.20 cm     LV e' medial:    5.01 cm/s LV PW:         1.00 cm     LV E/e' medial:  21.2 LV IVS:        1.10 cm     LV e' lateral:   5.06 cm/s LVOT diam:     2.00 cm     LV E/e' lateral: 20.9 LV SV:         57 LV SV Index:   32 LVOT Area:     3.14 cm  LV Volumes (MOD) LV vol d, MOD A2C: 94.8 ml LV vol d, MOD A4C: 99.9 ml LV vol s, MOD A2C: 42.9 ml LV vol s, MOD A4C: 43.3 ml LV SV MOD A2C:     51.9 ml LV SV MOD A4C:     99.9 ml LV SV MOD BP:      55.3 ml RIGHT VENTRICLE RV S prime:     11.80 cm/s TAPSE (M-mode): 1.3 cm LEFT ATRIUM             Index        RIGHT ATRIUM  Index LA diam:        4.00 cm 2.26 cm/m   RA Area:     16.70 cm LA Vol (A2C):   62.2 ml 35.15 ml/m  RA Volume:   41.00 ml  23.17 ml/m LA Vol (A4C):   54.6 ml 30.85 ml/m LA Biplane Vol: 61.9 ml 34.98 ml/m  AORTIC VALVE LVOT Vmax:   85.40 cm/s LVOT Vmean:  55.400 cm/s LVOT VTI:    0.181 m  AORTA Ao Root diam: 3.10 cm Ao Asc diam:  3.20 cm MITRAL VALVE                TRICUSPID VALVE MV Area (PHT): 2.99 cm     TR Peak grad:   30.2 mmHg MV Decel Time: 254 msec     TR Vmax:        275.00 cm/s MV E velocity:  106.00 cm/s MV A velocity: 36.20 cm/s   SHUNTS MV E/A ratio:  2.93         Systemic VTI:  0.18 m                             Systemic Diam: 2.00 cm Lyman Bishop MD Electronically signed by Lyman Bishop MD Signature Date/Time: 05/20/2022/2:25:44 PM    Final      EKG: atrial paced rhythm with rightward axis, first degree AV block.   Physical Examination: Temp:  [97.5 F (36.4 C)-98.7 F (37.1 C)] 97.5 F (36.4 C) (05/31 0838) Pulse Rate:  [61-74] 66 (05/31 0923) Resp:  [16-20] 16 (05/31 0923) BP: (113-149)/(57-90) 128/61 (05/31 0923) SpO2:  [97 %-100 %] 97 % (05/31 0838) Weight:  [91.1 kg] 91.1 kg (05/31 0419)  General - Well nourished, well developed, in no apparent distress.  Respiratory- dyspnea with talking and mild exertion on supplemental O2  Mental Status -  Level of arousal and orientation to time, place, and person were intact. Language including expression, comprehension was assessed and found intact. Attention span and concentration were normal. Recent and remote memory were intact. Fund of Knowledge was assessed and was intact.  Cranial Nerves II - XII  III, IV, VI - Extraocular movements intact. V - Facial sensation intact bilaterally. VII - Facial movement intact bilaterally. VIII - Hearing & vestibular intact bilaterally. XII - Tongue protrusion intact.  Motor Strength - The patient's strength was normal in all extremities and pronator drift was absent.  Bulk was normal and fasciculations were absent.   Motor Tone - Muscle tone was assessed at the neck and appendages and was normal.   Sensory - Light touch was assessed and found to be symmetrical.    Coordination - The patient had normal movements in the hands and feet with no ataxia or dysmetria.  Tremor was absent.  Gait and Station - deferred.   Assessment:  Tracey Bryant is a 71 y.o. female with history of cholecystitis, atrial fibrillation on Eliquis, HTN, DM, CHF and stroke presenting with  episodes of dizziness when sitting up in bed. She did not receive IV t-PA due to mild symptoms and stroke being subacute. Thrombectomy was not offered as she had no LVO.  Patient's stroke is likely incidental and not the cause of her symptoms of dizziness.  Continue anticoagulation with Eliquis and control of modifiable risk factors.  Stroke, incidental finding - questionable left BG infarct likely secondary to small vessel disease source MRI  probable subacute infarct in left basal  ganglia, chronic microvascular ischemic changes MRA patient refused Carotid Doppler unremarkable 2D Echo EF 58-52%, grade 2 diastolic dysfunction, no atrial level shunt LDL 52 HgbA1c 9.0  Fully anticoagulate with Eliquis for VTE prophylaxis Eliquis (apixaban) daily prior to admission, now on Eliquis (apixaban) daily.  No further antithrombotic needed given anemia. Therapy recommendations:  SNF Disposition:  pending  PAF On Eliquis Rate controlled Continue Eliquis at discharge  Hypertension Stable Long-term BP goal normotensive  Hyperlipidemia Home meds:  none LDL 52, goal < 70 Statin not indicated as LDL below goal  Diabetes type II, Uncontrolled HgbA1c 9.0, goal < 7.0 CBGs SSI Close PCP follow-up for better DM control  Other Stroke Risk Factors Advanced age Obesity, Body mass index is 32.42 kg/m., recommend weight loss, diet and exercise as appropriate  Hx stroke Congestive heart failure  Other Active Problems Acute on chronic CHF Management per cardiology, continue lasix ARF in setting of CKD Nephrology following, appreciate recommendations Avoid contrast when possible AHRF with hypoxia Due to pleural effusion and volume overload S/p thoracentesis Continue lasix Chronic cholecystitis Cholecystostomy drain in place Management per primary team  Hospital day # 6   Thank you for this consultation and allowing [place] to participate in the care of this patient.  [place] ,  MSN, AGACNP-BC [facility] See Amion for schedule and pager information 05/26/2022 11:55 AM  ATTENDING NOTE: I reviewed above note and agree with the assessment and plan. Pt was seen and examined.   71 year old female with history of PAF on Eliquis, hypertension, diabetes, CHF, pacemaker, CKD 4 admitted for shortness of breath, lower extremity edema status post Lasix and thoracentesis.  Complained of dizziness on standing up.  MRI showed old right occipital, parietal and frontal infarcts.  Small left BG restriction signal, possible small subacute infarct.  Carotid Doppler negative, A1c 9.0, LDL 52.  Patient refused MRA.  EF 50 to 55%, hemoglobin 11-10.8-10.3-10.1.  04/2020 stroke with right-sided weakness 03/2021 admitted for possible syncope.  CT no acute abnormality but old infarcts.  MRI showed right occipital subacute, chronic bilateral BG, thalamus, left cerebellum and right parietal occipital and frontal infarcts.  Carotid Doppler negative.  MRI showed intracranial stenosis.  EF 25 to 30%.  EEG no seizure.  LDL 39, A1c 9.1.  Continue Eliquis and Zetia 10 on discharge.  On exam, she is awake, alert, no family at the bedside, pt sitting in chair, eyes open, moderate to severe dysarthria, orientated to place, time, but not to age. No aphasia, fluent language with dysarthric voice and possible some paraphasic errors (likely chronic), following most simple commands. Able to name and repeat in dysarthric voice. No gaze palsy, tracking bilaterally, visual field full. No facial droop. Tongue midline. Left facial ecchymosis due to fall. Bilateral UEs 4/5, no drift however, right should ROM decreased due to chronic arthritis. Bilaterally LEs 3/5, no drift. Sensation symmetrical bilaterally subjectively, b/l FTN intact, gait not tested.   Etiology for patient dizziness likely not related to MRI finding.  MRI showed possible left BG small infarct could be related to small vessel disease.  Patient  refused MRA, which would not change management.  Continue Eliquis.  No further antithrombotic given anemia.  LDL at goal, no statin needed at this time.  PT therapy recommend SNF.  For detailed assessment and plan, please refer to above as I have made changes wherever appropriate.   Neurology will sign off. Please call with questions. Pt will follow up with stroke clinic NP  at [facility] in about 4 weeks. Thanks for the consult.   Rosalin Hawking, MD PhD Stroke Neurology 05/26/2022 5:22 PM   To contact Stroke Continuity provider, please refer to [email or web address] After hours, contact General Neurology

## 2022-05-26 NOTE — Progress Notes (Signed)
OT Cancellation Note  Patient Details Name: Tracey Bryant MRN: [id number] DOB: 08-12-1951   Cancelled Treatment:    Reason Eval/Treat Not Completed: Other (comment) (Pt was noted to be more dyspneic and uncomfortable today, having to pause during conversation and fatigued. Pt expressed that she would like to rest today "until some fluid comes off". Will check back at later date as schedule allows for OT.)  Almyra Deforest, OTR/L 05/26/2022, 11:20 AM

## 2022-05-26 NOTE — Progress Notes (Signed)
[facility]                                                                              Tracey Bryant, is a 71 y.o. female, DOB - [birth year], CZY:[id number] Admit date - 05/19/2022    Outpatient Primary MD for the patient is Theda Sers, Arlyn Leak, FNP  LOS - 6  days  Chief Complaint  Patient presents with   Shortness of Breath       Brief summary   Tracey Bryant is a 71 y.o. female with PMH cholecystitis with cholecystostomy tube in place (initially placed 03/19/2021), afib, HTN, DMII, sCHF, CVA who presented with shortness of breath.  She also had reported increased lower extremity swelling/overload with no improvement on her home Lasix. She underwent thoracentesis on 05/19/2022 removing 1.3 L.  Initially Lasix was held after developing some dizziness. Respiratory status improved after thoracentesis.    She also was evaluated by IR due to difficulty with cholecystostomy tube drainage.  Tube was evaluated and noted intact however suture had been removed but tube had not been retracted any.  After forceful flushing, biliary output was achieved with no further issues. Due to ongoing dizziness, MRI brain was obtained on 5/30 positive for subacute infarct.  Neurology consulted.  Assessment & Plan    Principal Problem:   Acute on chronic combined systolic and diastolic congestive heart failure (HCC) Moderate to large right-sided pleural effusion Acute respiratory failure with hypoxia -Underwent thoracentesis on 5/24 with removal of 1.3 L. -Initially Lasix held due to worsening renal function, dizziness however continues to have volume overload with persisting lower extremity edema.  Lasix resumed. -Cardiology, nephrology following, appreciate recommendations -Wean O2 as tolerated  Active Problems: Acute kidney injury superimposed on stage 4 CKD (Gross) -Underlying CKD likely related to hypertension, DM, currently worsened in context of acute on chronic systolic CHF  exacerbation, diuresis, ATN -Appreciate nephrology recommendation, agreeable to dialysis if needed/indicated    Dizziness/syncope, subacute CVA - in the setting of prior history of CVA -MRI brain showed probable small subacute infarct of the inferior left basal ganglia.  Chronic infarcts and chronic microvascular ischemic changes. -MRA pending -2D echo showed EF 50 to 55%, grade 2 DD, no shunt. -LDL 52, hemoglobin A1c 9.0. -Currently on eliquis. -Neurology consulted    Cholecystitis, chronic -Diagnosed with acute cholecystitis in 02/2021, underwent cholecystostomy tube placement on 03/19/2021 -Tube output had decreased admission, improved with flushing with radiology      Uncontrolled type 2 diabetes mellitus with hyperglycemia, with long-term current use of insulin (HCC) -Hemoglobin A1c 9.0, continue sliding scale     Paroxysmal atrial fibrillation (HCC) -Rate controlled, continue Coreg, eliquis   Hypertension -Continue Coreg, hydralazine, Imdur   Obesity Estimated body mass index is 32.42 kg/m as calculated from the following:   Height as of this encounter: '5\' 6"'$  (1.676 m).   Weight as of this encounter: 91.1 kg.  Code Status: Full CODE STATUS DVT Prophylaxis:   apixaban (ELIQUIS) tablet 5 mg   Level of Care: Level of care: Telemetry Cardiac Family Communication: Updated patient   Disposition Plan:      Remains inpatient appropriate:  On IV Lasix diuresis  Procedures:   Right thoracentesis, 05/19/2022 Biliary drain flushing per IR, 05/21/2022 2D echo  Consultants:   Neurology Nephrology Cardiology Interventional radiology  Antimicrobials: None   Medications  apixaban  5 mg Oral BID   carvedilol  6.25 mg Oral BID WC   docusate sodium  100 mg Oral BID   feeding supplement (GLUCERNA SHAKE)  237 mL Oral TID BM   hydrALAZINE  50 mg Oral Q8H   insulin aspart  0-15 Units Subcutaneous TID WC   insulin aspart  0-5 Units Subcutaneous QHS   insulin glargine-yfgn   12 Units Subcutaneous Daily   isosorbide mononitrate  15 mg Oral Daily   multivitamin with minerals  1 tablet Oral Daily   sodium chloride flush  3 mL Intravenous Q12H      Subjective:   Tracey Bryant was seen and examined today.  Feels short of breath with minimal exertion.  Feels short of breath on lying down and feels dizzy on sitting up.   Objective:   Vitals:   05/25/22 2241 05/26/22 0419 05/26/22 0838 05/26/22 0923  BP: (!) 118/59 113/60 (!) 122/57 128/61  Pulse: 61 61 63 66  Resp: '20 19 19 16  '$ Temp:  98.1 F (36.7 C) (!) 97.5 F (36.4 C)   TempSrc:  Oral Oral   SpO2:  100% 97%   Weight:  91.1 kg    Height:        Intake/Output Summary (Last 24 hours) at 05/26/2022 1401 Last data filed at 05/26/2022 0930 Gross per 24 hour  Intake 855 ml  Output 1775 ml  Net -920 ml     Wt Readings from Last 3 Encounters:  05/26/22 91.1 kg  01/08/22 68 kg  01/07/22 68.4 kg     Exam General: Alert and oriented x 3, NAD, visibly short of breath and unable to speak in full sentences Cardiovascular: S1 S2 auscultated,  RRR Respiratory: Decreased breath sounds at the bases Gastrointestinal: Soft, nontender, nondistended, + bowel sounds Ext: 2+ pedal edema bilaterally Neuro: no new deficits Psych: Normal affect and demeanor, alert and oriented x3     Data Reviewed:  I have personally reviewed following labs    CBC Lab Results  Component Value Date   WBC 5.9 05/26/2022   RBC 3.50 (L) 05/26/2022   HGB 10.1 (L) 05/26/2022   HCT 31.5 (L) 05/26/2022   MCV 90.0 05/26/2022   MCH 28.9 05/26/2022   PLT 287 05/26/2022   MCHC 32.1 05/26/2022   RDW 15.6 (H) 05/26/2022   LYMPHSABS 1.2 05/26/2022   MONOABS 0.5 05/26/2022   EOSABS 0.4 05/26/2022   BASOSABS 0.0 33/29/5188     Last metabolic panel Lab Results  Component Value Date   NA 136 05/26/2022   K 4.8 05/26/2022   CL 101 05/26/2022   CO2 27 05/26/2022   BUN 40 (H) 05/26/2022   CREATININE 3.10 (H) 05/26/2022    GLUCOSE 141 (H) 05/26/2022   GFRNONAA 16 (L) 05/26/2022   GFRAA >90 01/17/2013   CALCIUM 8.2 (L) 05/26/2022   PHOS 5.9 (H) 05/26/2022   PROT 5.3 (L) 05/21/2022   ALBUMIN 2.1 (L) 05/26/2022   BILITOT 0.5 05/21/2022   ALKPHOS 56 05/21/2022   AST 11 (L) 05/21/2022   ALT 9 05/21/2022   ANIONGAP 8 05/26/2022    CBG (last 3)  Recent Labs    05/25/22 2108 05/26/22 0615 05/26/22 1157  GLUCAP 163* 133* 212*      Coagulation Profile: No  results for input(s): INR, PROTIME in the last 168 hours.   Radiology Studies: I have personally reviewed the imaging studies  MR BRAIN WO CONTRAST  Result Date: 05/25/2022 CLINICAL DATA:  Neuro deficit, acute, stroke suspected EXAM: MRI HEAD WITHOUT CONTRAST TECHNIQUE: Multiplanar, multiecho pulse sequences of the brain and surrounding structures were obtained without intravenous contrast. COMPARISON:  04/06/2021 FINDINGS: Brain: Small focus of mild diffusion hyperintensity at the level of the inferior left basal ganglia (series 5, image 73). No evidence of intracranial hemorrhage. There is no intracranial mass or mass effect. There is no hydrocephalus or extra-axial fluid collection. Ventricles and sulci are stable in size and configuration. Chronic infarcts of the inferior right frontal lobe and right parietooccipital and posterior temporal lobes. Additional patchy foci of T2 hyperintensity in the supratentorial white matter nonspecific may reflect mild chronic microvascular ischemic changes. Probable small superimposed chronic infarcts of the central white matter and deep gray nuclei. A focus of susceptibility is again noted along the medial left temporal lobe and likely reflects chronic microhemorrhage. Vascular: Major vessel flow voids at the skull base are preserved. Skull and upper cervical spine: Normal marrow signal is preserved. Sinuses/Orbits: Minor mucosal thickening.  Orbits are unremarkable. Other: Sella is unremarkable.  Minor mastoid fluid  opacification. IMPRESSION: Probable small subacute infarct of the inferior left basal ganglia. Chronic infarcts and chronic microvascular ischemic changes similar to prior study. Electronically Signed   By: Macy Mis M.D.   On: 05/25/2022 15:49   DG Chest Port 1 View  Result Date: 05/26/2022 CLINICAL DATA:  Shortness of breath EXAM: PORTABLE CHEST 1 VIEW COMPARISON:  05/23/2022 FINDINGS: Left-sided implanted cardiac device. Stable mild cardiomegaly. Aortic atherosclerosis. Moderate right and small left pleural effusions with associated bibasilar opacities. No pneumothorax. IMPRESSION: Moderate right and small left pleural effusions with associated bibasilar opacities, likely atelectasis. Electronically Signed   By: Davina Poke D.O.   On: 05/26/2022 09:25   VAS US CAROTID  Result Date: 05/26/2022 Carotid Arterial Duplex Study Patient Name:  AIREAL SLATER  Date of Exam:   05/26/2022 Medical Rec #: [id number]        Accession #:    [id number] Date of Birth: 1951/06/19        Patient Gender: F Patient Age:   55 years Exam Location:  [facility] Procedure:      VAS US CAROTID Referring Phys: Cornelius Moras XU --------------------------------------------------------------------------------  Indications:       CVA. Risk Factors:      Hypertension, coronary artery disease, prior CVA. Comparison Study:  04/04/21 - carotid - minimal thickening or plaque bilaterally. Performing Technologist: Oda Cogan RDMS, RVT  Examination Guidelines: A complete evaluation includes B-mode imaging, spectral Doppler, color Doppler, and power Doppler as needed of all accessible portions of each vessel. Bilateral testing is considered an integral part of a complete examination. Limited examinations for reoccurring indications may be performed as noted.  Right Carotid Findings: +----------+--------+--------+--------+------------------+--------+           PSV cm/sEDV cm/sStenosisPlaque DescriptionComments  +----------+--------+--------+--------+------------------+--------+ CCA Prox  70      14                                         +----------+--------+--------+--------+------------------+--------+ CCA Distal59      17                                         +----------+--------+--------+--------+------------------+--------+  ICA Prox  70      22      1-39%   calcific                   +----------+--------+--------+--------+------------------+--------+ ICA Distal68      23                                         +----------+--------+--------+--------+------------------+--------+ ECA       98      14                                         +----------+--------+--------+--------+------------------+--------+ +----------+--------+-------+----------------+-------------------+           PSV cm/sEDV cmsDescribe        Arm Pressure (mmHG) +----------+--------+-------+----------------+-------------------+ Subclavian131            Multiphasic, WNL                    +----------+--------+-------+----------------+-------------------+ +---------+--------+--+--------+-+---------+ VertebralPSV cm/s27EDV cm/s8Antegrade +---------+--------+--+--------+-+---------+  Left Carotid Findings: +----------+--------+--------+--------+------------------+--------+           PSV cm/sEDV cm/sStenosisPlaque DescriptionComments +----------+--------+--------+--------+------------------+--------+ CCA Prox  76      18                                         +----------+--------+--------+--------+------------------+--------+ CCA Distal62      19                                         +----------+--------+--------+--------+------------------+--------+ ICA Prox  56      20                                         +----------+--------+--------+--------+------------------+--------+ ICA Distal68      27                                          +----------+--------+--------+--------+------------------+--------+ ECA       76      10                                         +----------+--------+--------+--------+------------------+--------+ +----------+--------+--------+----------------+-------------------+           PSV cm/sEDV cm/sDescribe        Arm Pressure (mmHG) +----------+--------+--------+----------------+-------------------+ [id number]              Multiphasic, WNL                    +----------+--------+--------+----------------+-------------------+ +---------+--------+--+--------+--+---------+ VertebralPSV cm/s29EDV cm/s10Antegrade +---------+--------+--+--------+--+---------+   Summary: Right Carotid: Velocities in the right ICA are consistent with a 1-39% stenosis. Left Carotid: The extracranial vessels were near-normal with only minimal wall               thickening or plaque. Vertebrals: Bilateral vertebral arteries demonstrate antegrade flow. *See  table(s) above for measurements and observations.     Preliminary        Estill Cotta M.D. [facility] 05/26/2022, 2:01 PM  Available via Epic secure chat 7am-7pm After 7 pm, please refer to night coverage provider listed on amion.

## 2022-05-26 NOTE — TOC Progression Note (Signed)
Transition of Care [facility]) - Progression Note    Patient Details  Name: Tracey Bryant MRN: [id number] Date of Birth: 1951/06/25  Transition of Care [facility]) CM/SW [place], [place] Phone Number: 05/26/2022, 2:09 PM  Clinical Narrative:     CSW is informed that CIR Josem Kaufmann was denied and that pt is interested in SNF. CSW met with pt and confirmed she would like ST rehab at [facility]. She has been to a facility in [place] before but is okay with SNF search in [place] and [place] area. Her son lives in [place] and her Daughter lives in [place] so she is hoping for a facility close by. Fl2 completed and bed requests faxed in hub.   1355: CSW met with pt and provided SNF bed offers. Pt chooses [facility] as it is highest rated out of offers. She request CSW update pt's son. CSW called pt's son and updated him; he is agreeable to plan for SNF at [facility]. CSW explained insurance auth process and will continue to follow for DC coordination.   CSW confirmed with [facility] they can accept pt; they will start insurance auth.   Expected Discharge Plan: Skilled Nursing Facility Barriers to Discharge: Insurance Authorization  Expected Discharge Plan and Services Expected Discharge Plan: [place]   Discharge Planning Services: CM Consult   Living arrangements for the past 2 months: Single Family Home                           HH Arranged: Refused [facility]           Social Determinants of Health (SDOH) Interventions    Readmission Risk Interventions    12/27/2021    2:32 PM 03/19/2021    9:34 AM 03/17/2021   11:56 AM  Readmission Risk Prevention Plan  Transportation Screening Complete  Complete  PCP or Specialist Appt within 5-7 Days  Complete   PCP or Specialist Appt within 3-5 Days Complete    Home Care Screening  Complete   Medication Review (RN CM)   Complete  HRI or Home Care Consult Complete    Social Work Consult for [place] Planning/Counseling  Complete    Palliative Care Screening Not Applicable    Medication Review Press photographer) Complete

## 2022-05-26 NOTE — Progress Notes (Addendum)
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    Patient was recently admitted 12/25/21 - 12/27/21 for CHF exacerbation - diuresed with Lasix IV 40 mg BID with total of -4.58 L volume removed.  Echo in December 2022 showed improvement in LVEF 40-45% (25-30% in 02/2021) with global hypokinesis, normal RV, and moderately elevated pulmonary pressures.  Discharge weight was 162 lbs.  Discharged on carvedilol 6.25 mg BID and furosemide 40 mg daily and was instructed to stop amlodipine 10 mg daily and BiDIl 20-37.5 mg TID.   On 05/24, she presented to her Cardiology office thinking she had an appointment and endorsed SOB, bilateral LEE, and reduced activity tolerance; MD referred her to [facility] ED.  Patient endorsed progressive symptoms despite recent increase Lasix 40 mg daily > BID at cardiology visit 05/09 where she reported worsening LEE, SOB, orthopnea, and PND.  CXR in the ED showed moderate R pleural effusion; dual PPM evident.  Repeat CXR 6h later demonstrated improvement in R pleural effusion, cardiomegaly and no evidence of interstitial edema.  Underwent R thoracentesis with 1.3 L removed.  She was started on IV diuresis 05/24, received Lasix IV 40 mg x1 and 60 mg x2 with marginal response , but her 1+ bilateral LE pitting edema was improved.    Breathing had improved on 05/25 s/p thoracentesis and diuresis, but renal function worsened (SCr 2.13 > 2.37, baseline ~1.6) and patient reported dizziness so diuretics were held.  Echo on 05/25 showed improvement in LVEF to 50-55% and elevated LVEDP (21) with new G2DD; still with normal RV and mildly elevated pulmonary pressures.    Patient continued to endorse SOB, feeling  swollen and fatigued w/ bilateral 2+ pitting LEE and JVD over the weekend (05/27-28).  She had minimal response of 550cc to Lasix IV 40 mg BID and was given 80 mg x1; nephrology also consulted and redosed 80 mg x1 the following day after ~1.4 L uop and worsening renal function (SCr 3.0 > 3.19).    She had a near syncopal event on 05/30 while sitting on the bed to perform orthostatics, was SOB while speaking and appeared more volume overloaded on exam.  JVD and bilateral 2+ pitting edema still present - nephrology increased Lasix to 80 mg IV x2.  She was taken for a MRI of brain 05/30 which revealed a probable small subacute infarct.    Today, she still has significant dyspnea with minimal improvement in breathing.  She reported another episode of dizziness when being moved this morning and felt extremely SOB.  Repeat CXR showed stable cardiomegaly with moderate right and small left pleural effusions.   Current HF Medications: Diuretic: furosemide IV 100 mg x1 per nephrology Beta Blocker: carvedilol 6.25 mg BID Other: hydralazine 50 mg q8h + Imdur 15 mg daily  Prior to admission HF Medications: Diuretic: furosemide 40 mg BID Beta blocker: carvedilol 6.25 mg BID Other: hydralazine 25 mg BID + Imdur 15 mg daily ; potassium 20 mEq BID  Pertinent Lab Values: Serum creatinine 3.19 > 3 > 3.1, BUN 40, Potassium 4.7, Sodium 136, BNP 2083.9, Magnesium 2.8, A1c 9.1% (up from 7.7% in Jan)  Vital Signs: Weight: 200 lbs (admission weight: 150 lbs - not accurate) Blood  pressure: 110-20s/60s Heart rate: 60s - Afib I/O: -875 mL yesterday; net -3.83L   Medication Assistance / Insurance Benefits Check: Does the patient have prescription insurance?  Yes Type of insurance plan: [name] [employer]  Does the patient qualify for medication assistance through manufacturers or grants?   Yes Eligible grants and/or patient assistance programs: pending initiation of ARNI/SGLT2i Medication assistance applications in  progress: pending  Medication assistance applications approved: pending  Outpatient Pharmacy:  Prior to admission outpatient pharmacy: [facility] Is the patient willing to use [place] pharmacy at discharge? Pending Is the patient willing to transition their outpatient pharmacy to utilize a [facility] outpatient pharmacy?   Pending    Assessment: 1. Acute on chronic combined diastolic and systolic HFimpEF (LVEF 88-33%). NYHA class II-III symptoms. SBP improving from 140s > 110-20s.  No improvement in breathing, dyspnea at rest. Still with JVD and persistent 2+ pitting bilateral LEE on exam.   GDMT initiation/titration limited by poor renal function.  - Diuresing with Lasix IV 100 mg x1 per renal  - Continue PTA carvediolol 6.25 mg BID - No ACEi/ARB/ARNI or MRA with significant renal dysfunction  - No SGLT2i with h/o UTI during March-April 2022 admission, A1c up 7.7% > 9.1% since Jan, and eGFR <20 - Continue PTA hydralazine 50 mg q8h + Imdur 15 mg daily    Plan: 1) Medication changes recommended at this time: - Continue regimen as above - F/U repeat UA and urine culture - Maintain Mg > 2 and K > 4 - Stop PTA amlodipine on discharge to allow for BP room with GDMT initiation/titration pending renal function improvement  2) Patient assistance: [name] = $47  3)  Education  - To be completed prior to discharge  Laurey Arrow, PharmD PGY1 Pharmacy Resident 05/26/2022  7:27 AM

## 2022-05-26 NOTE — Progress Notes (Signed)
Inpatient Rehab Admissions Coordinator:    Insurance is denying CIR. I spoke with pt. And she is amenable to SNF, hoping for one near her home in [place], [place]. MD and TOC notified. CIR will sign off.   Clemens Catholic, [place], [place] Admissions Coordinator  [phone] ([place]) [phone] (office)

## 2022-05-26 NOTE — NC FL2 (Signed)
[place] LEVEL OF CARE SCREENING TOOL     IDENTIFICATION  Patient Name: Tracey Bryant Birthdate: [birth year] Sex: female Admission Date (Current Location): 05/19/2022  [facility] and [place] Number:  Herbalist and Address:  [facility], [place] [address] [place], [place] [address]      Provider Number: [id number]  Attending Physician Name and Address:  Mendel Corning, MD  Relative Name and Phone Number:       Current Level of Care: Hospital Recommended Level of Care: [place] Prior Approval Number:    Date Approved/Denied:   PASRR Number: [id number]  Discharge Plan: SNF    Current Diagnoses: Patient Active Problem List   Diagnosis Date Noted   Dizziness 05/25/2022   History of CVA (cerebrovascular accident) 05/22/2022   Physical deconditioning 05/22/2022   Acute on chronic congestive heart failure ([place]) 05/20/2022   Acute on chronic systolic CHF (congestive heart failure) ([place]) 05/19/2022   Acute respiratory failure with hypoxia ([place]) 12/26/2021   Elevated troponin 12/26/2021   Elevated brain natriuretic peptide (BNP) level 12/26/2021   Hypoalbuminemia due to protein-calorie malnutrition ([place]) 12/26/2021   Acute renal failure superimposed on stage 4 chronic kidney disease ([place]) 12/26/2021   Acute exacerbation of CHF (congestive heart failure) ([place]) 12/25/2021   Acute on chronic HFrEF (heart failure with reduced ejection fraction) ([place]) 07/27/2021   Acute on chronic combined systolic and diastolic congestive heart failure ([place]) 07/26/2021   Paroxysmal atrial fibrillation ([place]) 07/26/2021   DVT (deep venous thrombosis) ([place]) 07/26/2021   Normocytic anemia 07/26/2021   Cholecystostomy tube dysfunction 07/26/2021   Moderate protein-calorie malnutrition ([place]) 07/26/2021   Posterior cerebral artery embolism, right    Severe sepsis ([place]) 03/19/2021   Cholecystitis, chronic 03/19/2021   Acute lower UTI  03/19/2021   Cholelithiasis without cholangitis 03/18/2021   Uncontrolled type 2 diabetes mellitus with hyperglycemia, with long-term current use of insulin ([place]) 03/18/2021   DKA (diabetic ketoacidosis) ([place]) 03/16/2021   AKI (acute kidney injury) ([place]) 03/16/2021   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[place]) 03/16/2021   Acute posthemorrhagic anemia 03/16/2021   Epistaxis 03/16/2021   Chronic anticoagulation 03/16/2021   CVA (cerebral vascular accident) ([place]) 03/16/2021   Rheumatoid arteritis ([place]) 02/15/2013   Type II diabetes mellitus ([place]) 02/15/2013   Hypertension 02/15/2013   Gall bladder stones 02/15/2013   Multiple lung nodules 02/15/2013    Orientation RESPIRATION BLADDER Height & Weight     Self, Time, Situation, Place  O2 (3L [place]) Incontinent, External catheter Weight: 200 lb 13.4 oz (91.1 kg) Height:  '5\' 6"'$  (167.6 cm)  BEHAVIORAL SYMPTOMS/MOOD NEUROLOGICAL BOWEL NUTRITION STATUS      Continent Diet (See d/c summary)  AMBULATORY STATUS COMMUNICATION OF NEEDS Skin   Extensive Assist Verbally Other (Comment) (Biliary tube)                       Personal Care Assistance Level of Assistance  Bathing, Feeding, Dressing Bathing Assistance: Limited assistance Feeding assistance: Independent Dressing Assistance: Limited assistance     Functional Limitations Info  Sight, Hearing, Speech Sight Info: Impaired Hearing Info: Adequate Speech Info: Adequate    SPECIAL CARE FACTORS FREQUENCY  OT (By licensed OT), PT (By licensed PT)     PT Frequency: 5x/week OT Frequency: 5x/week            Contractures Contractures Info: Not present    Additional Factors Info  Code Status, Allergies Code Status Info:  Full code Allergies Info: no known allergies           Current Medications (05/26/2022):  This is the current hospital active medication list Current Facility-Administered Medications  Medication Dose Route Frequency Provider Last Rate Last Admin    acetaminophen (TYLENOL) tablet 650 mg  650 mg Oral Q6H PRN Karmen Bongo, MD   650 mg at 05/25/22 2035   Or   acetaminophen (TYLENOL) suppository 650 mg  650 mg Rectal Q6H PRN Karmen Bongo, MD       apixaban Arne [place]) tablet 5 mg  5 mg Oral BID Karmen Bongo, MD   5 mg at 05/26/22 2878   bisacodyl (DULCOLAX) EC tablet 5 mg  5 mg Oral Daily PRN Karmen Bongo, MD       carvedilol (COREG) tablet 6.25 mg  6.25 mg Oral BID WC Karmen Bongo, MD   6.25 mg at 05/26/22 6767   docusate sodium (COLACE) capsule 100 mg  100 mg Oral BID Karmen Bongo, MD   100 mg at 05/26/22 2094   feeding supplement (GLUCERNA SHAKE) (GLUCERNA SHAKE) liquid 237 mL  237 mL Oral TID BM Vann, Jessica U, DO   237 mL at 05/26/22 7096   furosemide (LASIX) 100 mg in dextrose 5 % 50 mL IVPB  100 mg Intravenous Once Gean Quint, MD       hydrALAZINE (APRESOLINE) injection 5 mg  5 mg Intravenous Q4H PRN Karmen Bongo, MD       hydrALAZINE (APRESOLINE) tablet 50 mg  50 mg Oral Q8H Jettie Booze, MD   50 mg at 05/26/22 2836   HYDROmorphone (DILAUDID) injection 0.25 mg  0.25 mg Intravenous Q3H PRN Dwyane Dee, MD   0.25 mg at 05/25/22 1044   insulin aspart (novoLOG) injection 0-15 Units  0-15 Units Subcutaneous TID [facility] Karmen Bongo, MD   2 Units at 05/26/22 6294   insulin aspart (novoLOG) injection 0-5 Units  0-5 Units Subcutaneous QHS Karmen Bongo, MD   3 Units at 05/24/22 2208   insulin glargine-yfgn [facility]) injection 12 Units  12 Units Subcutaneous Daily Geradine Girt, DO   12 Units at 05/26/22 7654   isosorbide mononitrate (IMDUR) 24 hr tablet 15 mg  15 mg Oral Daily Karmen Bongo, MD   15 mg at 05/26/22 6503   multivitamin with minerals tablet 1 tablet  1 tablet Oral Daily Eulogio Bear U, DO   1 tablet at 05/26/22 0939   ondansetron (ZOFRAN) tablet 4 mg  4 mg Oral Q6H PRN Karmen Bongo, MD   4 mg at 05/22/22 2142   Or   ondansetron (ZOFRAN) injection 4 mg  4 mg Intravenous Q6H PRN Karmen Bongo, MD   4 mg at 05/25/22 0303   oxyCODONE (Oxy IR/ROXICODONE) immediate release tablet 5 mg  5 mg Oral Q4H PRN Karmen Bongo, MD       polyethylene glycol (MIRALAX / GLYCOLAX) packet 17 g  17 g Oral Daily PRN Karmen Bongo, MD       sodium chloride flush (NS) 0.9 % injection 3 mL  3 mL Intravenous Q12H Karmen Bongo, MD   3 mL at 05/26/22 5465   traZODone (DESYREL) tablet 25 mg  25 mg Oral QHS PRN Karmen Bongo, MD   25 mg at 05/25/22 2035     Discharge Medications: Please see discharge summary for a list of discharge medications.  Relevant Imaging Results:  Relevant Lab Results:   Additional Information SSN: [place], [place]

## 2022-05-26 NOTE — Plan of Care (Signed)
  Problem: Education: Goal: Ability to verbalize understanding of medication therapies will improve Outcome: Progressing Goal: Individualized Educational Video(s) Outcome: Progressing   Problem: Cardiac: Goal: Ability to achieve and maintain adequate cardiopulmonary perfusion will improve Outcome: Progressing

## 2022-05-27 DIAGNOSIS — R42 Dizziness and giddiness: Secondary | ICD-10-CM

## 2022-05-27 DIAGNOSIS — N170 Acute kidney failure with tubular necrosis: Secondary | ICD-10-CM | POA: Diagnosis not present

## 2022-05-27 DIAGNOSIS — I509 Heart failure, unspecified: Secondary | ICD-10-CM | POA: Diagnosis not present

## 2022-05-27 DIAGNOSIS — J9601 Acute respiratory failure with hypoxia: Secondary | ICD-10-CM | POA: Diagnosis not present

## 2022-05-27 DIAGNOSIS — I5043 Acute on chronic combined systolic (congestive) and diastolic (congestive) heart failure: Secondary | ICD-10-CM | POA: Diagnosis not present

## 2022-05-27 DIAGNOSIS — N184 Chronic kidney disease, stage 4 (severe): Secondary | ICD-10-CM | POA: Diagnosis not present

## 2022-05-27 DIAGNOSIS — N179 Acute kidney failure, unspecified: Secondary | ICD-10-CM | POA: Diagnosis not present

## 2022-05-27 LAB — HEPATITIS B SURFACE ANTIBODY,QUALITATIVE: Hep B S Ab: NONREACTIVE

## 2022-05-27 LAB — RENAL FUNCTION PANEL
Albumin: 2.2 g/dL — ABNORMAL LOW (ref 3.5–5.0)
Anion gap: 7 (ref 5–15)
BUN: 43 mg/dL — ABNORMAL HIGH (ref 8–23)
CO2: 26 mmol/L (ref 22–32)
Calcium: 8.1 mg/dL — ABNORMAL LOW (ref 8.9–10.3)
Chloride: 101 mmol/L (ref 98–111)
Creatinine, Ser: 3.24 mg/dL — ABNORMAL HIGH (ref 0.44–1.00)
GFR, Estimated: 15 mL/min — ABNORMAL LOW (ref >60)
Glucose, Bld: 195 mg/dL — ABNORMAL HIGH (ref 70–99)
Phosphorus: 6.3 mg/dL — ABNORMAL HIGH (ref 2.5–4.6)
Potassium: 5.1 mmol/L (ref 3.5–5.1)
Sodium: 134 mmol/L — ABNORMAL LOW (ref 135–145)

## 2022-05-27 LAB — CBC WITH DIFFERENTIAL/PLATELET
Abs Immature Granulocytes: 0.02 10*3/uL (ref 0.00–0.07)
Basophils Absolute: 0 10*3/uL (ref 0.0–0.1)
Basophils Relative: 1 %
Eosinophils Absolute: 0.4 10*3/uL (ref 0.0–0.5)
Eosinophils Relative: 7 %
HCT: 32.4 % — ABNORMAL LOW (ref 36.0–46.0)
Hemoglobin: 10.2 g/dL — ABNORMAL LOW (ref 12.0–15.0)
Immature Granulocytes: 0 %
Lymphocytes Relative: 18 %
Lymphs Abs: 1.2 10*3/uL (ref 0.7–4.0)
MCH: 28.3 pg (ref 26.0–34.0)
MCHC: 31.5 g/dL (ref 30.0–36.0)
MCV: 89.8 fL (ref 80.0–100.0)
Monocytes Absolute: 0.6 10*3/uL (ref 0.1–1.0)
Monocytes Relative: 9 %
Neutro Abs: 4.4 10*3/uL (ref 1.7–7.7)
Neutrophils Relative %: 65 %
Platelets: 348 10*3/uL (ref 150–400)
RBC: 3.61 MIL/uL — ABNORMAL LOW (ref 3.87–5.11)
RDW: 15.5 % (ref 11.5–15.5)
WBC: 6.7 10*3/uL (ref 4.0–10.5)
nRBC: 0 % (ref 0.0–0.2)

## 2022-05-27 LAB — GLUCOSE, CAPILLARY
Glucose-Capillary: 211 mg/dL — ABNORMAL HIGH (ref 70–99)
Glucose-Capillary: 244 mg/dL — ABNORMAL HIGH (ref 70–99)
Glucose-Capillary: 213 mg/dL — ABNORMAL HIGH (ref 70–99)
Glucose-Capillary: 161 mg/dL — ABNORMAL HIGH (ref 70–99)

## 2022-05-27 LAB — HEPATITIS B SURFACE ANTIGEN: Hepatitis B Surface Ag: NONREACTIVE

## 2022-05-27 LAB — HEPATITIS C ANTIBODY: HCV Ab: NONREACTIVE

## 2022-05-27 LAB — MAGNESIUM: Magnesium: 2.9 mg/dL — ABNORMAL HIGH (ref 1.7–2.4)

## 2022-05-27 MED ORDER — METOLAZONE 2.5 MG PO TABS
2.5000 mg | ORAL_TABLET | Freq: Once | ORAL | Status: AC
Start: 2022-05-27 — End: 2022-05-27
  Administered 2022-05-27: 2.5 mg via ORAL
  Filled 2022-05-27: qty 1

## 2022-05-27 MED ORDER — HYDROCORTISONE 1 % EX CREA
TOPICAL_CREAM | Freq: Two times a day (BID) | CUTANEOUS | Status: DC
  Filled 2022-05-27 (×2): qty 28

## 2022-05-27 MED ORDER — SODIUM CHLORIDE 0.9 % IV SOLN
1.0000 g | INTRAVENOUS | Status: DC
  Administered 2022-05-27 – 2022-05-28 (×2): 1 g via INTRAVENOUS
  Filled 2022-05-27 (×2): qty 10

## 2022-05-27 MED ORDER — FUROSEMIDE 10 MG/ML IJ SOLN
100.0000 mg | Freq: Two times a day (BID) | INTRAVENOUS | Status: AC
  Administered 2022-05-27 (×2): 100 mg via INTRAVENOUS
  Filled 2022-05-27 (×2): qty 10

## 2022-05-27 MED ORDER — DIPHENHYDRAMINE HCL 25 MG PO CAPS
25.0000 mg | ORAL_CAPSULE | Freq: Four times a day (QID) | ORAL | Status: DC | PRN
  Administered 2022-05-31 – 2022-06-08 (×7): 25 mg via ORAL
  Filled 2022-05-27 (×7): qty 1

## 2022-05-27 NOTE — Progress Notes (Signed)
Inpatient Diabetes Program Recommendations  AACE/ADA: New Consensus Statement on Inpatient Glycemic Control   Target Ranges:  Prepandial:   less than 140 mg/dL      Peak postprandial:   less than 180 mg/dL (1-2 hours)      Critically ill patients:  140 - 180 mg/dL    Latest Reference Range & Units 05/27/22 06:15  Glucose-Capillary 70 - 99 mg/dL 211 (H)    Latest Reference Range & Units 05/26/22 06:15 05/26/22 11:57 05/26/22 15:15 05/26/22 21:10  Glucose-Capillary 70 - 99 mg/dL 133 (H) 212 (H) 167 (H) 215 (H)   Review of Glycemic Control  Diabetes history: DM2 Outpatient Diabetes medications: Glipizide XL 5 mg daily Current orders for Inpatient glycemic control: Semglee 12 units daily, Novolog 0-15 units TID with meals, Novloog 0-5 units QHS  Inpatient Diabetes Program Recommendations:    Insulin: Please consider ordering Novolog 3 units TID with meals for meal coverage if patient eats at least 50% of meals.  Thanks, Barnie Alderman, RN, MSN, [place] Diabetes Coordinator Inpatient Diabetes Program [phone] (Team Pager from 8am to [place])

## 2022-05-27 NOTE — Progress Notes (Signed)
Progress Note  Patient Name: Tracey Bryant Date of Encounter: 05/27/2022  [facility] Cardiologist: Minus Breeding, MD   Subjective   No acute overnight events. She is laying almost completely flat today but still incredibly dyspneic. She only had 1.2 L of urinary output yesterday with 2 dose of IV Lasix '100mg'$ . She reports only slightly improvement in breathing if she is laying completely still but she still gets incredibly short of breath with minimal activity and while talking. No chest pain.   Inpatient Medications    Scheduled Meds:  apixaban  5 mg Oral BID   carvedilol  6.25 mg Oral BID WC   docusate sodium  100 mg Oral BID   feeding supplement (GLUCERNA SHAKE)  237 mL Oral TID BM   hydrALAZINE  50 mg Oral Q8H   insulin aspart  0-15 Units Subcutaneous TID WC   insulin aspart  0-5 Units Subcutaneous QHS   insulin glargine-yfgn  12 Units Subcutaneous Daily   isosorbide mononitrate  15 mg Oral Daily   metolazone  2.5 mg Oral Once   multivitamin with minerals  1 tablet Oral Daily   sodium chloride flush  3 mL Intravenous Q12H   Continuous Infusions:  furosemide     PRN Meds: acetaminophen **OR** acetaminophen, bisacodyl, hydrALAZINE, HYDROmorphone (DILAUDID) injection, ondansetron **OR** ondansetron (ZOFRAN) IV, oxyCODONE, polyethylene glycol, traZODone   Vital Signs    Vitals:   05/26/22 2000 05/26/22 2203 05/27/22 0424 05/27/22 0726  BP:  137/65 (!) 151/60 (!) 110/51  Pulse: 76  64 60  Resp: 20 (!) '22 17 16  '$ Temp:   98.6 F (37 C) 98.1 F (36.7 C)  TempSrc:   Oral Oral  SpO2: 98%  99% 97%  Weight:   90.3 kg   Height:        Intake/Output Summary (Last 24 hours) at 05/27/2022 1026 Last data filed at 05/27/2022 0800 Gross per 24 hour  Intake 1020 ml  Output 1575 ml  Net -555 ml      05/27/2022    4:24 AM 05/26/2022    4:19 AM 05/25/2022    6:11 AM  Last 3 Weights  Weight (lbs) 199 lb 1.2 oz 200 lb 13.4 oz 214 lb 8.1 oz  Weight (kg) 90.3 kg 91.1 kg 97.3  kg      Telemetry    A-paced rhythm. - Personally Reviewed  ECG    No new ECG tracing today. - Personally Reviewed  Physical Exam   GEN: Ill appearing Caucasian female in mild respiratory distress.   Neck: No JVD. Cardiac: RRR. No murmurs, rubs, or gallops.  Respiratory: Tachypneic with mild increased work of breathing. Unable to speak in full sentences. Decreased breath sounds in bilateral bases. Occasional rhonchi but no significant crackles appreciated. GI: Soft, non-distended, and non-tender. MS: 2+ pitting edema of bilateral lower extremities. No deformity. Neuro:  Nonfocal  Psych: Normal affect   Labs    High Sensitivity Troponin:  No results for input(s): TROPONINIHS in the last 720 hours.   Chemistry Recent Labs  Lab 05/21/22 0256 05/22/22 0450 05/25/22 0411 05/26/22 0313 05/27/22 0402  NA 136   < > 132* 136 134*  K 4.3   < > 4.7 4.8 5.1  CL 103   < > 101 101 101  CO2 24   < > '22 27 26  '$ GLUCOSE 303*   < > 151* 141* 195*  BUN 21   < > 36* 40* 43*  CREATININE 2.70*   < >  3.01* 3.10* 3.24*  CALCIUM 8.0*   < > 8.2* 8.2* 8.1*  MG  --    < > 2.6* 2.8* 2.9*  PROT 5.3*  --   --   --   --   ALBUMIN 1.9*   < > 2.1* 2.1* 2.2*  AST 11*  --   --   --   --   ALT 9  --   --   --   --   ALKPHOS 56  --   --   --   --   BILITOT 0.5  --   --   --   --   GFRNONAA 18*   < > 16* 16* 15*  ANIONGAP 9   < > '9 8 7   '$ < > = values in this interval not displayed.    Lipids  Recent Labs  Lab 05/26/22 0313  CHOL 99  TRIG 54  HDL 36*  LDLCALC 52  CHOLHDL 2.8    Hematology Recent Labs  Lab 05/25/22 0411 05/26/22 0313 05/27/22 0402  WBC 7.3 5.9 6.7  RBC 3.63* 3.50* 3.61*  HGB 10.3* 10.1* 10.2*  HCT 32.7* 31.5* 32.4*  MCV 90.1 90.0 89.8  MCH 28.4 28.9 28.3  MCHC 31.5 32.1 31.5  RDW 15.7* 15.6* 15.5  PLT 334 287 348   Thyroid No results for input(s): TSH, FREET4 in the last 168 hours.  BNPNo results for input(s): BNP, PROBNP in the last 168 hours.  DDimer No  results for input(s): DDIMER in the last 168 hours.   Radiology    MR BRAIN WO CONTRAST  Result Date: 05/25/2022 CLINICAL DATA:  Neuro deficit, acute, stroke suspected EXAM: MRI HEAD WITHOUT CONTRAST TECHNIQUE: Multiplanar, multiecho pulse sequences of the brain and surrounding structures were obtained without intravenous contrast. COMPARISON:  04/06/2021 FINDINGS: Brain: Small focus of mild diffusion hyperintensity at the level of the inferior left basal ganglia (series 5, image 73). No evidence of intracranial hemorrhage. There is no intracranial mass or mass effect. There is no hydrocephalus or extra-axial fluid collection. Ventricles and sulci are stable in size and configuration. Chronic infarcts of the inferior right frontal lobe and right parietooccipital and posterior temporal lobes. Additional patchy foci of T2 hyperintensity in the supratentorial white matter nonspecific may reflect mild chronic microvascular ischemic changes. Probable small superimposed chronic infarcts of the central white matter and deep gray nuclei. A focus of susceptibility is again noted along the medial left temporal lobe and likely reflects chronic microhemorrhage. Vascular: Major vessel flow voids at the skull base are preserved. Skull and upper cervical spine: Normal marrow signal is preserved. Sinuses/Orbits: Minor mucosal thickening.  Orbits are unremarkable. Other: Sella is unremarkable.  Minor mastoid fluid opacification. IMPRESSION: Probable small subacute infarct of the inferior left basal ganglia. Chronic infarcts and chronic microvascular ischemic changes similar to prior study. Electronically Signed   By: Macy Mis M.D.   On: 05/25/2022 15:49   DG Chest Port 1 View  Result Date: 05/26/2022 CLINICAL DATA:  Shortness of breath EXAM: PORTABLE CHEST 1 VIEW COMPARISON:  05/23/2022 FINDINGS: Left-sided implanted cardiac device. Stable mild cardiomegaly. Aortic atherosclerosis. Moderate right and small left  pleural effusions with associated bibasilar opacities. No pneumothorax. IMPRESSION: Moderate right and small left pleural effusions with associated bibasilar opacities, likely atelectasis. Electronically Signed   By: Davina Poke D.O.   On: 05/26/2022 09:25   VAS US CAROTID  Result Date: 05/26/2022 Carotid Arterial Duplex Study Patient Name:  KAHMYA PINKHAM  Date  of Exam:   05/26/2022 Medical Rec #: [id number]        Accession #:    [id number] Date of Birth: [birth year]        Patient Gender: F Patient Age:   1 years Exam Location:  [facility] Procedure:      VAS US CAROTID Referring Phys: Cornelius Moras XU --------------------------------------------------------------------------------  Indications:       CVA. Risk Factors:      Hypertension, coronary artery disease, prior CVA. Comparison Study:  04/04/21 - carotid - minimal thickening or plaque bilaterally. Performing Technologist: Oda Cogan RDMS, RVT  Examination Guidelines: A complete evaluation includes B-mode imaging, spectral Doppler, color Doppler, and power Doppler as needed of all accessible portions of each vessel. Bilateral testing is considered an integral part of a complete examination. Limited examinations for reoccurring indications may be performed as noted.  Right Carotid Findings: +----------+--------+--------+--------+------------------+--------+           PSV cm/sEDV cm/sStenosisPlaque DescriptionComments +----------+--------+--------+--------+------------------+--------+ CCA Prox  70      14                                         +----------+--------+--------+--------+------------------+--------+ CCA Distal59      17                                         +----------+--------+--------+--------+------------------+--------+ ICA Prox  70      22      1-39%   calcific                   +----------+--------+--------+--------+------------------+--------+ ICA Distal68      23                                          +----------+--------+--------+--------+------------------+--------+ ECA       98      14                                         +----------+--------+--------+--------+------------------+--------+ +----------+--------+-------+----------------+-------------------+           PSV cm/sEDV cmsDescribe        Arm Pressure (mmHG) +----------+--------+-------+----------------+-------------------+ [id number]            Multiphasic, WNL                    +----------+--------+-------+----------------+-------------------+ +---------+--------+--+--------+-+---------+ VertebralPSV cm/s27EDV cm/s8Antegrade +---------+--------+--+--------+-+---------+  Left Carotid Findings: +----------+--------+--------+--------+------------------+--------+           PSV cm/sEDV cm/sStenosisPlaque DescriptionComments +----------+--------+--------+--------+------------------+--------+ CCA Prox  76      18                                         +----------+--------+--------+--------+------------------+--------+ CCA Distal62      19                                         +----------+--------+--------+--------+------------------+--------+ ICA Prox  56  20                                         +----------+--------+--------+--------+------------------+--------+ ICA Distal68      27                                         +----------+--------+--------+--------+------------------+--------+ ECA       76      10                                         +----------+--------+--------+--------+------------------+--------+ +----------+--------+--------+----------------+-------------------+           PSV cm/sEDV cm/sDescribe        Arm Pressure (mmHG) +----------+--------+--------+----------------+-------------------+ [id number]              Multiphasic, WNL                    +----------+--------+--------+----------------+-------------------+  +---------+--------+--+--------+--+---------+ VertebralPSV cm/s29EDV cm/s10Antegrade +---------+--------+--+--------+--+---------+   Summary: Right Carotid: Velocities in the right ICA are consistent with a 1-39% stenosis. Left Carotid: The extracranial vessels were near-normal with only minimal wall               thickening or plaque. Vertebrals: Bilateral vertebral arteries demonstrate antegrade flow. *See table(s) above for measurements and observations.  Electronically signed by Antony Contras MD on 05/26/2022 at 4:53:09 PM.    Final     Cardiac Studies   Echocardiogram 05/20/2022: Impressions: 1. Left ventricular ejection fraction, by estimation, is 50 to 55%. The  left ventricle has low normal function. The left ventricle has no regional  wall motion abnormalities. Left ventricular diastolic parameters are  consistent with Grade II diastolic  dysfunction (pseudonormalization). Elevated left ventricular end-diastolic  pressure. The E/e' is 21.   2. Right ventricular systolic function is normal. The right ventricular  size is mildly enlarged. There is mildly elevated pulmonary artery  systolic pressure. The estimated right ventricular systolic pressure is  42.5 mmHg.   3. Left atrial size was mildly dilated.   4. The mitral valve is abnormal. Trivial mitral valve regurgitation.   5. The aortic valve is tricuspid. Aortic valve regurgitation is not  visualized.   6. The inferior vena cava is normal in size with <50% respiratory  variability, suggesting right atrial pressure of 8 mmHg.   Comparison(s): Changes from prior study are noted. 12/26/2021: LVEF  40-45%, RVSP 45.8 mmHg.   Patient Profile     71 y.o. female with a history of chronic combined CHF with EF previously as low as 25-30% but improved to 40-45% on Echo in 11/2021 and 50-55% on Echo this admission, paroxysmal atrial fibrillation on Eliquis, hypertension, type 2 diabetes mellitus, and CKD stage IV who was admitted on  05/19/2022 with acute on chronic combined CHF.  Assessment & Plan    Acute on Chronic Combined CHF BNP 2,083. Initial chest x-ray showed interval progression of right base collapse/consolidation with moderate right pleural effusion. Repeat chest x-ray on 05/23/2022 showed interval increase in bilateral lower lobe hazy opacities suggesting pleural effusion and/or atelectasis. Echo this admission showed LVEF of 50-55% with grade 2 diastolic dysfunction, mildly enlarged RV with normal systolic function, and mildly elevated RVSP of 38.2  mmHg. Patient has been diuresed with IV Lasix this admission but it has been limited by renal function. Therefore, Nephrology was consulted and is assisting with this. Creatinine continues to slowly increase. We may be heading towards dialysis.  - Still very dyspneic and volume overloaded on exam.  - Patient received 2 dose of IV Lasix '100mg'$  yesterday with only 1.2 L of urinary output. Creatinine up to 3.24 today. Will defer diuresis to Nephrology - plan is for another 2 dose of IV Lasix '100mg'$  today along with one dose of Metolazone 2.'5mg'$ . - Continue Coreg 6.'25mg'$  twice daily. - Continue Hydralazine '50mg'$  three times daily and Imdur '15mg'$  daily. - No ACEi/ARB/ARNI or MRA due to renal function. - Continue to monitor daily weights, strict I/Os, and renal function closely.   Right Pleural Effusion S/p right thoracentesis on 5/24 with removal of 1.3L of fluid. Repeat chest x-ray on 05/26/2022 showed moderate right and small left pleural effusions. - Patient states her breathing improved after thoracentesis so I wonder if she needs a repeat thoracentesis on the right side. Will discuss with MD.   Paroxysmal Atrial Fibrillation S/p Dual Chamber Medtronic PPM Telemetry shows paced rhythm. - Continue Coreg 6.'25mg'$  twice daily. - Continue Eliquis '5mg'$  twice daily.   Dizziness Subacute Stroke Patient has had occasional episodes of lightheadedness/dizziness this admission  Brain  MRI on 5/31 showed probable small subacute infarct of the inferior left basal ganglia. Neurology was consulted who did not feel like this was the cause of her dizziness. They recommended continuing Eliquis. - Management per primary team.    Hypertension BP mostly well controlled. - Continue medications for CHF as above.   Acute on CKD Stage IV Creatinine 3.24 today (slightly up from 3.10 yesterday). Prior baseline ranged from 1.6 to 2.5. - Nephrology is following.   Otherwise, per primary team: - Type 2 diabetes mellitus - Anemia - Chronic cholecystitis s/p cholecystostomy tube  For questions or updates, please contact [facility] Please consult [email or web address] for contact info under        Signed, Darreld Mclean, PA-C  05/27/2022, 10:26 AM

## 2022-05-27 NOTE — Progress Notes (Signed)
Nutrition Follow-up  DOCUMENTATION CODES:   Not applicable  INTERVENTION:   - Continue Glucerna Shake po TID, each supplement provides 220 kcal and 10 grams of protein   - Continue MVI with minerals daily  - Chopped meats with meal trays for ease of intake   - Encourage PO intake  NUTRITION DIAGNOSIS:   Increased nutrient needs related to chronic illness (CHF) as evidenced by estimated needs.  Ongoing, being addressed via oral nutrition supplements  GOAL:   Patient will meet greater than or equal to 90% of their needs  Progressing  MONITOR:   PO intake, Supplement acceptance, I & O's, Weight trends, Labs  REASON FOR ASSESSMENT:   Consult Other ("nutritional goals")  ASSESSMENT:   71 year old female who presented to the ED on 5/24 with SOB. PMH of PAF, HTN, DM, CHF, CVA. Pt admitted with acute on chronic systolic CHF, R pleural effusion.  05/24 - s/p thoracentesis with 1300 ml fluid removed  Spoke with pt at bedside. Pt is dyspneic during conversation with RD. She reports excellent appetite and confirms that she is eating well. She loves the Glucerna shakes and would like to continue to receive them. Pt reports issues with consuming the meat due to not having teeth. She is amenable to RD adjusting meal order so that she receives chopped meats. Encouraged ongoing adequate PO intake to maintain lean muscle mass and promote healing. Pt expresses understanding.  Of note, pt may require initiation of dialysis. Nephrology following. Plan is to continue aggressive diuresis at this time.  Admit weight: 91.6 kg Current weight: 90.3 kg  Pt with moderate pitting edema to BLE.  Meal Completion: 50-100%  Medications reviewed and include: colace, Glucerna TID, SSI, semglee 12 units daily, MVI with minerals, metolazone, IV lasix  Labs reviewed: sodium 134, BUN 43, creatinine 3.24, phosphorus 6.3, magnesium 2.9, hemoglobin A1C 9.0 CBG's: 167-215 x 24 hours  UOP: 1200 ml x 24  hours Biliary drain: 475 ml x 24 hours I/O's: -4.3 L since admit  Diet Order:   Diet Order             Diet heart healthy/carb modified Room service appropriate? Yes; Fluid consistency: Thin; Fluid restriction: 1500 mL Fluid  Diet effective now                   EDUCATION NEEDS:   Education needs have been addressed  Skin:  Skin Assessment: Reviewed RN Assessment  Last BM:  05/26/22  Height:   Ht Readings from Last 1 Encounters:  05/19/22 '5\' 6"'$  (1.676 m)    Weight:   Wt Readings from Last 1 Encounters:  05/27/22 90.3 kg    BMI:  Body mass index is 32.13 kg/m.  Estimated Nutritional Needs:   Kcal:  1700-1900  Protein:  85-100 grams  Fluid:  1.8 L    Gustavus Bryant, MS, RD, LDN Inpatient Clinical Dietitian Please see AMiON for contact information.

## 2022-05-27 NOTE — Care Management Important Message (Signed)
Important Message  Patient Details  Name: Tracey Bryant MRN: [id number] Date of Birth: 11-12-1951   Medicare Important Message Given:  Yes     Shelda Altes 05/27/2022, 9:02 AM

## 2022-05-27 NOTE — Progress Notes (Signed)
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    Patient was recently admitted 12/25/21 - 12/27/21 for CHF exacerbation - diuresed with Lasix IV 40 mg BID with total of -4.58 L volume removed.  Echo in December 2022 showed improvement in LVEF 40-45% (25-30% in 02/2021) with global hypokinesis, normal RV, and moderately elevated pulmonary pressures.  Discharge weight was 162 lbs.  Discharged on carvedilol 6.25 mg BID and furosemide 40 mg daily and was instructed to stop amlodipine 10 mg daily and BiDIl 20-37.5 mg TID.   On 05/24, she presented to her Cardiology office thinking she had an appointment and endorsed SOB, bilateral LEE, and reduced activity tolerance; MD referred her to [facility].  Patient endorsed progressive symptoms despite recent increase Lasix 40 mg daily > BID at cardiology visit 05/09 where she reported worsening LEE, SOB, orthopnea, and PND.  CXR in the ED showed moderate R pleural effusion; dual PPM evident.  Repeat CXR 6h later demonstrated improvement in R pleural effusion, cardiomegaly and no evidence of interstitial edema.  Underwent R thoracentesis with 1.3 L removed.  She was started on IV diuresis 05/24, received Lasix IV 40 mg x1 and 60 mg x2 with marginal response , but her 1+ bilateral LE pitting edema was improved.    Breathing had improved on 05/25 s/p thoracentesis and diuresis, but renal function worsened (SCr 2.13 > 2.37, baseline ~1.6) and patient reported dizziness so diuretics were held.  Echo on 05/25 showed improvement in LVEF to 50-55% and elevated LVEDP (21) with new G2DD; still with normal RV and mildly elevated pulmonary pressures.    Patient continued to endorse SOB, feeling  swollen and fatigued w/ bilateral 2+ pitting LEE and JVD over the weekend (05/27-28).  She had minimal response of 550cc to Lasix IV 40 mg BID and was given 80 mg x1; nephrology also consulted and redosed 80 mg x1 the following day after ~1.4 L uop and worsening renal function (SCr 3.0 > 3.19).    She had a near syncopal event on 05/30 while sitting on the bed to perform orthostatics, was SOB while speaking and appeared more volume overloaded on exam.  JVD and bilateral 2+ pitting edema still present - nephrology increased Lasix to 80 mg IV x2.  She was taken for a MRI of brain 05/30 which revealed a probable small subacute infarct.    Repeat CXR 5/31 showed stable cardiomegaly with moderate right and small left pleural effusions.  Possible plans for HD if she does not make an adequate amount of urine with aggressive diuresis  Current HF Medications: Diuretic: furosemide IV 100 mg x2 + metolazone 2.5 mg per nephrology Beta Blocker: carvedilol 6.25 mg BID Other: hydralazine 50 mg q8h + Imdur 15 mg daily  Prior to admission HF Medications: Diuretic: furosemide 40 mg BID Beta blocker: carvedilol 6.25 mg BID Other: hydralazine 25 mg BID + Imdur 15 mg daily ; potassium 20 mEq BID  Pertinent Lab Values: Serum creatinine 3.24, BUN 43, Potassium 5.1, Sodium 134, BNP 2083.9, Magnesium 2.9, A1c 9.1% (up from 7.7% in Jan)  Vital Signs: Weight: 199 lbs (admission weight: 150 lbs - not accurate) Blood pressure: 110-150s/60s Heart rate: 60s - Afib I/O: -1.3L yesterday;  net -4.2L   Medication Assistance / Insurance Benefits Check: Does the patient have prescription insurance?  Yes Type of insurance plan: [name] [employer]  Does the patient qualify for medication assistance through manufacturers or grants?   Yes Eligible grants and/or patient assistance programs: pending initiation of ARNI/SGLT2i Medication assistance applications in progress: pending  Medication assistance applications approved:  pending  Outpatient Pharmacy:  Prior to admission outpatient pharmacy: [facility] Is the patient willing to use [facility] pharmacy at discharge? Pending Is the patient willing to transition their outpatient pharmacy to utilize a [facility] outpatient pharmacy?   Pending    Assessment: 1. 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 NYHA class IV symptoms. SBP labile. No improvement in breathing, dyspnea at rest. Almost able to lay flat. No JVD and persistent 2+ pitting bilateral LEE on exam.   GDMT initiation/titration limited by poor renal function.  - Diuresing with Lasix IV 100 mg x2 + metolazone 2.5 mg x1 per renal  - Continue PTA carvediolol 6.25 mg BID - No ACEi/ARB/ARNI or MRA with significant renal dysfunction  - No SGLT2i with h/o UTI during March-April 2022 admission, A1c up 7.7% > 9.1% since Jan, and eGFR <20. UA this admission with many bacteria, nitrite negative. UCx with >100k ecoli - treating with ceftriaxone - Continue PTA hydralazine 50 mg q8h + Imdur 15 mg daily    Plan: 1) Medication changes recommended at this time: - Continue regimen as above - Maintain Mg > 2 and K > 4 - Stop PTA amlodipine on discharge to allow for BP room with GDMT initiation/titration pending renal function improvement  2) Patient assistance: [name] = $47  3)  Education  - To be completed prior to discharge  Kerby Nora, PharmD, BCPS Heart Failure Stewardship Pharmacist Phone [phone]  Please check [email or web address] for unit-specific pharmacist phone numbers

## 2022-05-27 NOTE — Plan of Care (Signed)

## 2022-05-27 NOTE — Progress Notes (Signed)
Occupational Therapy Treatment Patient Details Name: Tracey Bryant MRN: [id number] DOB: Nov 04, 1951 Today's Date: 05/27/2022   History of present illness The pt is a 71 yo female presenting from home 5/24 with SOB and BLE swelling. Found to have acute CHF exacerbation and R pleural effusion s/p thoracentesis 5/24. PMH includes: arthritis, CHF, DM II, DVT, HTN, afib, and stroke.   OT comments  Patient having difficulty demonstrating progress on this date. Patient received in supine and states she was fearful of passing out and falling. Patient was transferred to recliner with face to face technique for increased safety and patient demonstrating knee buckling and posterior leaning. Patient performed grooming and UB bathing seated in recliner and asked to use BSC. Patient was mod assist with transfer due to difficulty stepping towards [facility] and mod assist for hygiene.  Patient was assisted back to bed due to fatigue. Discharge recommendation to SNF to increase safety with transfers and self care.    Recommendations for follow up therapy are one component of a multi-disciplinary discharge planning process, led by the attending physician.  Recommendations may be updated based on patient status, additional functional criteria and insurance authorization.    Follow Up Recommendations  Skilled nursing-short term rehab (<3 hours/day)    Assistance Recommended at Discharge Intermittent Supervision/Assistance  Patient can return home with the following  Assistance with cooking/housework;Help with stairs or ramp for entrance;A lot of help with walking and/or transfers;A lot of help with bathing/dressing/bathroom   Equipment Recommendations  Other (comment) (continue to assess)    Recommendations for Other Services      Precautions / Restrictions Precautions Precautions: Fall Precaution Comments: psuedo-syncopal episodes with supine to sit (hold onto her so she doesn't fall over) Restrictions Weight  Bearing Restrictions: No       Mobility Bed Mobility Overal bed mobility: Needs Assistance Bed Mobility: Rolling, Sidelying to Sit, Sit to Supine Rolling: Min assist Sidelying to sit: Mod assist   Sit to supine: Min assist   General bed mobility comments: required assistance with trunk to sit on EOB and assistance with BLEs to return to supine    Transfers Overall transfer level: Needs assistance Equipment used: None Transfers: Bed to chair/wheelchair/BSC Sit to Stand: Min assist, Mod assist Stand pivot transfers: Mod assist         General transfer comment: patient unsteady on this date and required mod assist with face to face transfers     Balance Overall balance assessment: Needs assistance Sitting-balance support: Single extremity supported, No upper extremity supported, Feet supported Sitting balance-Leahy Scale: Fair Sitting balance - Comments: sitting on EOB with min guard assist.   Standing balance support: During functional activity Standing balance-Leahy Scale: Zero Standing balance comment: stood for transfers with knees buckling and unsteady balance                           ADL either performed or assessed with clinical judgement   ADL Overall ADL's : Needs assistance/impaired     Grooming: Wash/dry hands;Wash/dry face;Oral care;Brushing hair;Supervision/safety;Sitting Grooming Details (indicate cue type and reason): supervision for safety due to jerky movements Upper Body Bathing: Supervision/ safety;Sitting Upper Body Bathing Details (indicate cue type and reason): seated at sink             Toilet Transfer: Moderate assistance;Stand-pivot;BSC/3in1 Armed forces technical officer Details (indicate cue type and reason): recliner to [facility] transfer with mod assist due to knees buckling and patient demonstrating "jerky" movements Toileting- Clothing  Manipulation and Hygiene: Moderate assistance;Sitting/lateral lean Toileting - Clothing Manipulation  Details (indicate cue type and reason): mod assist to complete while seated       General ADL Comments: increased fear of falling and passing out on this date    Extremity/Trunk Assessment              Vision       Perception     Praxis      Cognition Arousal/Alertness: Awake/alert Behavior During Therapy: WFL for tasks assessed/performed Overall Cognitive Status: Impaired/Different from baseline Area of Impairment: Memory, Safety/judgement, Awareness, Problem solving                     Memory: Decreased short-term memory   Safety/Judgement: Decreased awareness of safety, Decreased awareness of deficits Awareness: Intellectual Problem Solving: Decreased initiation, Difficulty sequencing, Requires verbal cues General Comments: patient motivated but fearful of passing out with mobility.        Exercises      Shoulder Instructions       General Comments      Pertinent Vitals/ Pain       Pain Assessment Pain Assessment: Faces Faces Pain Scale: Hurts a little bit Pain Location: generalized with movement Pain Descriptors / Indicators: Aching Pain Intervention(s): Monitored during session, Repositioned  Home Living                                          Prior Functioning/Environment              Frequency  Min 2X/week        Progress Toward Goals  OT Goals(current goals can now be found in the care plan section)  Progress towards OT goals: Not progressing toward goals - comment (increased difficulty with transfers)  Acute Rehab OT Goals Patient Stated Goal: go home OT Goal Formulation: With patient Time For Goal Achievement: 06/03/22 Potential to Achieve Goals: Fair ADL Goals Pt Will Perform Lower Body Dressing: with modified independence;with adaptive equipment;sitting/lateral leans;sit to/from stand Pt Will Transfer to Toilet: with modified independence;ambulating;bedside commode Pt Will Perform Toileting -  Clothing Manipulation and hygiene: with modified independence;sitting/lateral leans;sit to/from stand Additional ADL Goal #1: Pt will be Mod I bed mobility in preparation for increased participation w/ ADL's Additional ADL Goal #2: Pt will state and implement 1-2 energy conservation techniques w/ less than 2 vc's (issue and review handout)  Plan Discharge plan remains appropriate    Co-evaluation                 AM-PAC OT "6 Clicks" Daily Activity     Outcome Measure   Help from another person eating meals?: None Help from another person taking care of personal grooming?: A Little Help from another person toileting, which includes using toliet, bedpan, or urinal?: A Lot Help from another person bathing (including washing, rinsing, drying)?: A Lot Help from another person to put on and taking off regular upper body clothing?: A Little Help from another person to put on and taking off regular lower body clothing?: A Lot 6 Click Score: 16    End of Session Equipment Utilized During Treatment: Gait belt  OT Visit Diagnosis: Unsteadiness on feet (R26.81);Muscle weakness (generalized) (M62.81)   Activity Tolerance Patient tolerated treatment well   Patient Left in bed;with call bell/phone within reach;with bed alarm set   Nurse Communication Mobility status  Time: 0757-3225 OT Time Calculation (min): 46 min  Charges: OT General Charges $OT Visit: 1 Visit OT Treatments $Self Care/Home Management : 38-52 mins  Lodema Hong, [place]  Pager [phone] Office [place] 05/27/2022, 12:07 PM

## 2022-05-27 NOTE — Progress Notes (Addendum)
[facility]                                                                              Tracey Bryant, is a 71 y.o. female, DOB - 1951/02/16, EXH:[id number] Admit date - 05/19/2022    Outpatient Primary MD for the patient is Theda Sers, Arlyn Leak, FNP  LOS - 7  days  Chief Complaint  Patient presents with   Shortness of Breath       Brief summary   Tracey Bryant is a 71 y.o. female with PMH cholecystitis with cholecystostomy tube in place (initially placed 03/19/2021), afib, HTN, DMII, sCHF, CVA who presented with shortness of breath.  She also had reported increased lower extremity swelling/overload with no improvement on her home Lasix. She underwent thoracentesis on 05/19/2022 removing 1.3 L.  Initially Lasix was held after developing some dizziness. Respiratory status improved after thoracentesis.    She also was evaluated by IR due to difficulty with cholecystostomy tube drainage.  Tube was evaluated and noted intact however suture had been removed but tube had not been retracted any.  After forceful flushing, biliary output was achieved with no further issues. Due to ongoing dizziness, MRI brain was obtained on 5/30 positive for subacute infarct.  Neurology consulted.  Assessment & Plan    Principal Problem:   Acute on chronic combined systolic and diastolic congestive heart failure (HCC) Moderate to large right-sided pleural effusion Acute respiratory failure with hypoxia -Underwent thoracentesis on 5/24 with removal of 1.3 L. -Initially Lasix held due to worsening renal function, dizziness however continues to have volume overload with persisting lower extremity edema.  Lasix resumed. -Cardiology, nephrology following, appreciate recommendations -On IV Lasix for diuresis, negative balance of 4.2 L -Nephrology, cardiology following   Moderate to large right-sided pleural effusion -Initial thoracentesis on 5/24 -CXR on 5/31 showed moderate right and small left  pleural effusion, ordered right-sided ultrasound guided thoracentesis  E. coli UTI -Urine culture showed more than 100,000 colonies of E. Coli -Placed on IV Rocephin   Acute kidney injury superimposed on stage 4 CKD ([facility]) -Underlying CKD likely related to hypertension, DM, currently worsened in context of acute on chronic systolic CHF exacerbation, diuresis, ATN -Appreciate nephrology recommendation, agreeable to dialysis if needed/indicated    Dizziness/syncope, subacute CVA - in the setting of prior history of CVA -MRI brain showed probable small subacute infarct of the inferior left basal ganglia.  Chronic infarcts and chronic microvascular ischemic changes.  Patient declined MRA. -Carotid Dopplers unremarkable. -2D echo showed EF 50 to 55%, grade 2 DD, no shunt. -LDL 52, hemoglobin A1c 9.0. -Continue eliquis     Cholecystitis, chronic -Diagnosed with acute cholecystitis in 02/2021, underwent cholecystostomy tube placement on 03/19/2021 -Tube output had decreased admission, improved with flushing with radiology      Uncontrolled type 2 diabetes mellitus with hyperglycemia, with long-term current use of insulin (HCC) -Hemoglobin A1c 9.0, continue sliding scale insulin     Paroxysmal atrial fibrillation (HCC) -Rate controlled, continue Coreg, eliquis   Hypertension -Continue Coreg, hydralazine, Imdur  Rash on the back - ?  Contact dermatitis, placed on hydrocortisone ointment   Obesity Estimated  body mass index is 32.13 kg/m as calculated from the following:   Height as of this encounter: '5\' 6"'$  (1.676 m).   Weight as of this encounter: 90.3 kg.  Code Status: Full CODE STATUS DVT Prophylaxis:   apixaban (ELIQUIS) tablet 5 mg   Level of Care: Level of care: Telemetry Cardiac Family Communication: Updated patient   Disposition Plan:      Remains inpatient appropriate: On IV Lasix diuresis  Procedures:   Right thoracentesis, 05/19/2022 Biliary drain flushing per  IR, 05/21/2022 2D echo  Consultants:   Neurology Nephrology Cardiology Interventional radiology  Antimicrobials: None   Medications  apixaban  5 mg Oral BID   carvedilol  6.25 mg Oral BID WC   docusate sodium  100 mg Oral BID   feeding supplement (GLUCERNA SHAKE)  237 mL Oral TID BM   hydrALAZINE  50 mg Oral Q8H   hydrocortisone cream   Topical BID   insulin aspart  0-15 Units Subcutaneous TID WC   insulin aspart  0-5 Units Subcutaneous QHS   insulin glargine-yfgn  12 Units Subcutaneous Daily   isosorbide mononitrate  15 mg Oral Daily   multivitamin with minerals  1 tablet Oral Daily   sodium chloride flush  3 mL Intravenous Q12H      Subjective:   Tracey Bryant was seen and examined today.  Eating lunch, still feels winded and Bryant of breath with minimal exertion.  Rash on the back, itching. + Dysuria, no fevers or chills   Objective:   Vitals:   05/27/22 0424 05/27/22 0726 05/27/22 1307 05/27/22 1309  BP: (!) 151/60 (!) 110/51 130/60 130/60  Pulse: 64 60  61  Resp: '17 16  17  '$ Temp: 98.6 F (37 C) 98.1 F (36.7 C)  97.9 F (36.6 C)  TempSrc: Oral Oral  Oral  SpO2: 99% 97%  99%  Weight: 90.3 kg     Height:        Intake/Output Summary (Last 24 hours) at 05/27/2022 1342 Last data filed at 05/27/2022 1300 Gross per 24 hour  Intake 1140 ml  Output 1575 ml  Net -435 ml     Wt Readings from Last 3 Encounters:  05/27/22 90.3 kg  01/08/22 68 kg  01/07/22 68.4 kg   Physical Exam General: Alert and oriented x 3, NAD, pleasant, visibly dyspneic during conversation Cardiovascular: S1 S2 clear, RRR.  Respiratory: Decreased breath sounds bilateral bases R>L Gastrointestinal: Soft, nontender, nondistended, NBS Ext: 2+ pedal edema bilaterally Neuro: no new deficits Skin: Rash on the upper back Psych: Normal affect and demeanor, alert and oriented x3     Data Reviewed:  I have personally reviewed following labs    CBC Lab Results  Component Value Date    WBC 6.7 05/27/2022   RBC 3.61 (L) 05/27/2022   HGB 10.2 (L) 05/27/2022   HCT 32.4 (L) 05/27/2022   MCV 89.8 05/27/2022   MCH 28.3 05/27/2022   PLT 348 05/27/2022   MCHC 31.5 05/27/2022   RDW 15.5 05/27/2022   LYMPHSABS 1.2 05/27/2022   MONOABS 0.6 05/27/2022   EOSABS 0.4 05/27/2022   BASOSABS 0.0 44/31/5400     Last metabolic panel Lab Results  Component Value Date   NA 134 (L) 05/27/2022   K 5.1 05/27/2022   CL 101 05/27/2022   CO2 26 05/27/2022   BUN 43 (H) 05/27/2022   CREATININE 3.24 (H) 05/27/2022   GLUCOSE 195 (H) 05/27/2022   GFRNONAA 15 (L) 05/27/2022   GFRAA >90  01/17/2013   CALCIUM 8.1 (L) 05/27/2022   PHOS 6.3 (H) 05/27/2022   PROT 5.3 (L) 05/21/2022   ALBUMIN 2.2 (L) 05/27/2022   BILITOT 0.5 05/21/2022   ALKPHOS 56 05/21/2022   AST 11 (L) 05/21/2022   ALT 9 05/21/2022   ANIONGAP 7 05/27/2022    CBG (last 3)  Recent Labs    05/26/22 2110 05/27/22 0615 05/27/22 1127  GLUCAP 215* 211* 244*      Coagulation Profile: No results for input(s): INR, PROTIME in the last 168 hours.   Radiology Studies: I have personally reviewed the imaging studies  MR BRAIN WO CONTRAST  Result Date: 05/25/2022 CLINICAL DATA:  Neuro deficit, acute, stroke suspected EXAM: MRI HEAD WITHOUT CONTRAST TECHNIQUE: Multiplanar, multiecho pulse sequences of the brain and surrounding structures were obtained without intravenous contrast. COMPARISON:  04/06/2021 FINDINGS: Brain: Small focus of mild diffusion hyperintensity at the level of the inferior left basal ganglia (series 5, image 73). No evidence of intracranial hemorrhage. There is no intracranial mass or mass effect. There is no hydrocephalus or extra-axial fluid collection. Ventricles and sulci are stable in size and configuration. Chronic infarcts of the inferior right frontal lobe and right parietooccipital and posterior temporal lobes. Additional patchy foci of T2 hyperintensity in the supratentorial white matter  nonspecific may reflect mild chronic microvascular ischemic changes. Probable small superimposed chronic infarcts of the central white matter and deep gray nuclei. A focus of susceptibility is again noted along the medial left temporal lobe and likely reflects chronic microhemorrhage. Vascular: Major vessel flow voids at the skull base are preserved. Skull and upper cervical spine: Normal marrow signal is preserved. Sinuses/Orbits: Minor mucosal thickening.  Orbits are unremarkable. Other: Sella is unremarkable.  Minor mastoid fluid opacification. IMPRESSION: Probable small subacute infarct of the inferior left basal ganglia. Chronic infarcts and chronic microvascular ischemic changes similar to prior study. Electronically Signed   By: Macy Mis M.D.   On: 05/25/2022 15:49   DG Chest Port 1 View  Result Date: 05/26/2022 CLINICAL DATA:  Shortness of breath EXAM: PORTABLE CHEST 1 VIEW COMPARISON:  05/23/2022 FINDINGS: Left-sided implanted cardiac device. Stable mild cardiomegaly. Aortic atherosclerosis. Moderate right and small left pleural effusions with associated bibasilar opacities. No pneumothorax. IMPRESSION: Moderate right and small left pleural effusions with associated bibasilar opacities, likely atelectasis. Electronically Signed   By: Davina Poke D.O.   On: 05/26/2022 09:25   VAS US CAROTID  Result Date: 05/26/2022 Carotid Arterial Duplex Study Patient Name:  Tracey Bryant  Date of Exam:   05/26/2022 Medical Rec #: [id number]        Accession #:    [id number] Date of Birth: 06/19/1951        Patient Gender: F Patient Age:   37 years Exam Location:  [facility] Procedure:      VAS US CAROTID Referring Phys: Cornelius Moras XU --------------------------------------------------------------------------------  Indications:       CVA. Risk Factors:      Hypertension, coronary artery disease, prior CVA. Comparison Study:  04/04/21 - carotid - minimal thickening or plaque bilaterally. Performing  Technologist: Oda Cogan RDMS, RVT  Examination Guidelines: A complete evaluation includes B-mode imaging, spectral Doppler, color Doppler, and power Doppler as needed of all accessible portions of each vessel. Bilateral testing is considered an integral part of a complete examination. Limited examinations for reoccurring indications may be performed as noted.  Right Carotid Findings: +----------+--------+--------+--------+------------------+--------+           PSV cm/sEDV cm/sStenosisPlaque  DescriptionComments +----------+--------+--------+--------+------------------+--------+ CCA Prox  70      14                                         +----------+--------+--------+--------+------------------+--------+ CCA Distal59      17                                         +----------+--------+--------+--------+------------------+--------+ ICA Prox  70      22      1-39%   calcific                   +----------+--------+--------+--------+------------------+--------+ ICA Distal68      23                                         +----------+--------+--------+--------+------------------+--------+ ECA       98      14                                         +----------+--------+--------+--------+------------------+--------+ +----------+--------+-------+----------------+-------------------+           PSV cm/sEDV cmsDescribe        Arm Pressure (mmHG) +----------+--------+-------+----------------+-------------------+ [id number]            Multiphasic, WNL                    +----------+--------+-------+----------------+-------------------+ +---------+--------+--+--------+-+---------+ VertebralPSV cm/s27EDV cm/s8Antegrade +---------+--------+--+--------+-+---------+  Left Carotid Findings: +----------+--------+--------+--------+------------------+--------+           PSV cm/sEDV cm/sStenosisPlaque DescriptionComments  +----------+--------+--------+--------+------------------+--------+ CCA Prox  76      18                                         +----------+--------+--------+--------+------------------+--------+ CCA Distal62      19                                         +----------+--------+--------+--------+------------------+--------+ ICA Prox  56      20                                         +----------+--------+--------+--------+------------------+--------+ ICA Distal68      27                                         +----------+--------+--------+--------+------------------+--------+ ECA       76      10                                         +----------+--------+--------+--------+------------------+--------+ +----------+--------+--------+----------------+-------------------+           PSV cm/sEDV cm/sDescribe  Arm Pressure (mmHG) +----------+--------+--------+----------------+-------------------+ [id number]              Multiphasic, WNL                    +----------+--------+--------+----------------+-------------------+ +---------+--------+--+--------+--+---------+ VertebralPSV cm/s29EDV cm/s10Antegrade +---------+--------+--+--------+--+---------+   Summary: Right Carotid: Velocities in the right ICA are consistent with a 1-39% stenosis. Left Carotid: The extracranial vessels were near-normal with only minimal wall               thickening or plaque. Vertebrals: Bilateral vertebral arteries demonstrate antegrade flow. *See table(s) above for measurements and observations.  Electronically signed by Antony Contras MD on 05/26/2022 at 4:53:09 PM.    Final        Estill Cotta M.D. [facility] 05/27/2022, 1:42 PM  Available via Epic secure chat 7am-7pm After 7 pm, please refer to night coverage provider listed on amion.

## 2022-05-27 NOTE — Progress Notes (Signed)
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 Underlying CKD likely related to HTN and DM -Cr slightly up with diuresis which is expected. Nonoliguric however would've expected a more robust urine output -will redose her lasix today: '100mg'$  x 2 doses today along with metolazone 2.'5mg'$  x 1. -no indications for renal replacement therapy however if she does not make an adequate amount of urine with aggressive diuresis and remains volume overloaded then will need to proceed with starting her on HD, she is agreeable to dialysis if needed/indicated. 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 Would hold off on biopsy for now especially given resp status and I can't foresee her laying flat for a biopsy. Management at this time would not change if we were to do a biopsy. Can certainly consider this as an outpatient, see below -Avoid nephrotoxic medications including NSAIDs and iodinated intravenous contrast exposure unless the latter is absolutely indicated.  Preferred narcotic agents for pain control are hydromorphone, fentanyl, and methadone. Morphine should not be used. Avoid Baclofen and avoid oral sodium phosphate and magnesium citrate based laxatives / bowel preps. Continue strict Input and Output monitoring. Will monitor the patient closely with you and intervene or adjust therapy as indicated by changes in clinical status/labs   Acute on chronic sCHF exacerbation -cardiology following -diuresis as above  Dizziness/syncope -secondary to subacute basal ganglia infarct as seen on MRI -mgmt per primary. This sounds like it's from BPPV as opposed to orthostatic  issues  HTN -BP w/ reasonable control  Proteinuria -suspecting this is more so related to DM -has seen rheum in the past-ruled out lupus. HIV neg in 07/2021 -will check secondary/serologic work up while she's here (hep bsag neg, hep sab, hep c ab, FLC, SPEP pending)  Pleural effusion secondary to CHF -s/p rt thora 5/24  Choleycystitis -per primary service  Anemia -hgb at goal for CKD  DM2 w/ hyperglycemia -DM mgmt per primary service  Subjective:   Patient seen and examined this AM.  She reports that her breathing is slightly better as compared to yesterday but still has issues with orthopnea and had one episode of PND overnight.  She does report dysuria.  Denies any chest pain, n/v, loss of appetite, dysgeusia, brain fog, new shakes/tremors Uop ~1.4L (w/ 1 unmeasured void) She is net neg ~4L since admit. Per PT note yesterday, her orthostats were negative. She does report that her current symptoms of dizziness feels exactly like when she had vertigo (she reports having BPPV). She reports that she had 'neck exercises' and PT at her [facility] facility in the past which fixed this.   Objective:   BP (!) 110/51 (BP Location: Left Arm)   Pulse 60   Temp 98.1 F (36.7 C) (Oral)   Resp 16   Ht '5\' 6"'$  (1.676 m)   Wt 90.3 kg   SpO2 97%   BMI 32.13 kg/m   Intake/Output Summary (Last 24 hours) at 05/27/2022 0824 Last data filed at 05/27/2022 8657 Gross per 24 hour  Intake 1355 ml  Output 1575 ml  Net -220 ml   Weight change: -0.8 kg  Physical Exam: Gen: NAD, laying flat in bed CVS:s1s2, rrr  Resp:decreased breath sounds bibasilar, no overt w/r/r/c, unable to speak in full sentences BTD:VVOH Ext:2+ pitting edema b/l Les (unchanged) Neuro: awake, alert, no asterixis  Imaging: MR BRAIN WO CONTRAST  Result Date: 05/25/2022 CLINICAL DATA:  Neuro deficit, acute, stroke suspected EXAM: MRI HEAD WITHOUT CONTRAST TECHNIQUE: Multiplanar, multiecho pulse sequences of the brain and  surrounding structures were obtained without intravenous contrast. COMPARISON:  04/06/2021 FINDINGS: Brain: Small focus of mild diffusion hyperintensity at the level of the inferior left basal ganglia (series 5, image 73). No evidence of intracranial hemorrhage. There is no intracranial mass or mass effect. There is no hydrocephalus or extra-axial fluid collection. Ventricles and sulci are stable in size and configuration. Chronic infarcts of the inferior right frontal lobe and right parietooccipital and posterior temporal lobes. Additional patchy foci of T2 hyperintensity in the supratentorial white matter nonspecific may reflect mild chronic microvascular ischemic changes. Probable small superimposed chronic infarcts of the central white matter and deep gray nuclei. A focus of susceptibility is again noted along the medial left temporal lobe and likely reflects chronic microhemorrhage. Vascular: Major vessel flow voids at the skull base are preserved. Skull and upper cervical spine: Normal marrow signal is preserved. Sinuses/Orbits: Minor mucosal thickening.  Orbits are unremarkable. Other: Sella is unremarkable.  Minor mastoid fluid opacification. IMPRESSION: Probable small subacute infarct of the inferior left basal ganglia. Chronic infarcts and chronic microvascular ischemic changes similar to prior study. Electronically Signed   By: Macy Mis M.D.   On: 05/25/2022 15:49   DG Chest Port 1 View  Result Date: 05/26/2022 CLINICAL DATA:  Shortness of breath EXAM: PORTABLE CHEST 1 VIEW COMPARISON:  05/23/2022 FINDINGS: Left-sided implanted cardiac device. Stable mild cardiomegaly. Aortic atherosclerosis. Moderate right and small left pleural effusions with associated bibasilar opacities. No pneumothorax. IMPRESSION: Moderate right and small left pleural effusions with associated bibasilar opacities, likely atelectasis. Electronically Signed   By: Davina Poke D.O.   On: 05/26/2022 09:25   VAS US  CAROTID  Result Date: 05/26/2022 Carotid Arterial Duplex Study Patient Name:  Tracey Bryant  Date of Exam:   05/26/2022 Medical Rec #: [id number]        Accession #:    [id number] Date of Birth: 07-09-1951        Patient Gender: F Patient Age:   71 years Exam Location:  [facility] Procedure:      VAS US CAROTID Referring Phys: Cornelius Moras XU --------------------------------------------------------------------------------  Indications:       CVA. Risk Factors:      Hypertension, coronary artery disease, prior CVA. Comparison Study:  04/04/21 - carotid - minimal thickening or plaque bilaterally. Performing Technologist: Oda Cogan RDMS, RVT  Examination Guidelines: A complete evaluation includes B-mode imaging, spectral Doppler, color Doppler, and power Doppler as needed of all accessible portions of each vessel. Bilateral testing is considered an integral part of a complete examination. Limited examinations for reoccurring indications may be performed as noted.  Right Carotid Findings: +----------+--------+--------+--------+------------------+--------+           PSV cm/sEDV cm/sStenosisPlaque DescriptionComments +----------+--------+--------+--------+------------------+--------+ CCA Prox  70      14                                         +----------+--------+--------+--------+------------------+--------+ CCA Distal59      17                                         +----------+--------+--------+--------+------------------+--------+  ICA Prox  70      22      1-39%   calcific                   +----------+--------+--------+--------+------------------+--------+ ICA Distal68      23                                         +----------+--------+--------+--------+------------------+--------+ ECA       98      14                                         +----------+--------+--------+--------+------------------+--------+  +----------+--------+-------+----------------+-------------------+           PSV cm/sEDV cmsDescribe        Arm Pressure (mmHG) +----------+--------+-------+----------------+-------------------+ Subclavian131            Multiphasic, WNL                    +----------+--------+-------+----------------+-------------------+ +---------+--------+--+--------+-+---------+ VertebralPSV cm/s27EDV cm/s8Antegrade +---------+--------+--+--------+-+---------+  Left Carotid Findings: +----------+--------+--------+--------+------------------+--------+           PSV cm/sEDV cm/sStenosisPlaque DescriptionComments +----------+--------+--------+--------+------------------+--------+ CCA Prox  76      18                                         +----------+--------+--------+--------+------------------+--------+ CCA Distal62      19                                         +----------+--------+--------+--------+------------------+--------+ ICA Prox  56      20                                         +----------+--------+--------+--------+------------------+--------+ ICA Distal68      27                                         +----------+--------+--------+--------+------------------+--------+ ECA       76      10                                         +----------+--------+--------+--------+------------------+--------+ +----------+--------+--------+----------------+-------------------+           PSV cm/sEDV cm/sDescribe        Arm Pressure (mmHG) +----------+--------+--------+----------------+-------------------+ [id number]              Multiphasic, WNL                    +----------+--------+--------+----------------+-------------------+ +---------+--------+--+--------+--+---------+ VertebralPSV cm/s29EDV cm/s10Antegrade +---------+--------+--+--------+--+---------+   Summary: Right Carotid: Velocities in the right ICA are consistent with a 1-39% stenosis.  Left Carotid: The extracranial vessels were near-normal with only minimal wall               thickening or plaque. Vertebrals: Bilateral vertebral arteries demonstrate antegrade flow. *  See table(s) above for measurements and observations.  Electronically signed by Antony Contras MD on 05/26/2022 at 4:53:09 PM.    Final     Labs: BMET Recent Labs  Lab 05/21/22 3790 05/22/22 2409 05/23/22 7353 05/24/22 0207 05/25/22 0411 05/26/22 0313 05/27/22 0402  NA 136 137 136 135 132* 136 134*  K 4.3 4.3 4.8 4.6 4.7 4.8 5.1  CL 103 105 105 101 101 101 101  CO2 '24 24 24 25 22 27 26  '$ GLUCOSE 303* 192* 202* 230* 151* 141* 195*  BUN 21 25* 30* 34* 36* 40* 43*  CREATININE 2.70* 2.79* 3.00* 3.19* 3.01* 3.10* 3.24*  CALCIUM 8.0* 8.0* 8.2* 8.2* 8.2* 8.2* 8.1*  PHOS  --   --   --  4.4 4.7* 5.9* 6.3*   CBC Recent Labs  Lab 05/24/22 0207 05/25/22 0411 05/26/22 0313 05/27/22 0402  WBC 6.7 7.3 5.9 6.7  NEUTROABS 4.5 4.8 3.8 4.4  HGB 10.8* 10.3* 10.1* 10.2*  HCT 33.3* 32.7* 31.5* 32.4*  MCV 90.0 90.1 90.0 89.8  PLT 327 334 287 348    Medications:     apixaban  5 mg Oral BID   carvedilol  6.25 mg Oral BID WC   docusate sodium  100 mg Oral BID   feeding supplement (GLUCERNA SHAKE)  237 mL Oral TID BM   hydrALAZINE  50 mg Oral Q8H   insulin aspart  0-15 Units Subcutaneous TID WC   insulin aspart  0-5 Units Subcutaneous QHS   insulin glargine-yfgn  12 Units Subcutaneous Daily   isosorbide mononitrate  15 mg Oral Daily   metolazone  2.5 mg Oral Once   multivitamin with minerals  1 tablet Oral Daily   sodium chloride flush  3 mL Intravenous Q12H      Gean Quint, MD [facility] 05/27/2022, 8:24 AM

## 2022-05-27 NOTE — Progress Notes (Signed)
Pt was attempting to sit on the side of the bed for dinner but when she attempted to sit up, she collapsed back onto the bed. Eyes rolled and became fixed for about a minute and patient was unable to move. She was in a deep stare then she slowly came out of the fixation.  Pt was told not to get up and her legs were lifted back onto the bed. She was repositioned to eat in bed. No tremors were noted at the time.

## 2022-05-28 ENCOUNTER — Inpatient Hospital Stay (HOSPITAL_COMMUNITY): Payer: Medicare HMO

## 2022-05-28 DIAGNOSIS — K811 Chronic cholecystitis: Secondary | ICD-10-CM | POA: Diagnosis not present

## 2022-05-28 DIAGNOSIS — J9601 Acute respiratory failure with hypoxia: Secondary | ICD-10-CM | POA: Diagnosis not present

## 2022-05-28 DIAGNOSIS — N170 Acute kidney failure with tubular necrosis: Secondary | ICD-10-CM | POA: Diagnosis not present

## 2022-05-28 DIAGNOSIS — I5043 Acute on chronic combined systolic (congestive) and diastolic (congestive) heart failure: Secondary | ICD-10-CM | POA: Diagnosis not present

## 2022-05-28 HISTORY — PX: IR THORACENTESIS ASP PLEURAL SPACE W/IMG GUIDE: IMG5380

## 2022-05-28 LAB — URINE CULTURE: Culture: >=100000 — AB

## 2022-05-28 LAB — RENAL FUNCTION PANEL
Albumin: 2.2 g/dL — ABNORMAL LOW (ref 3.5–5.0)
Anion gap: 9 (ref 5–15)
BUN: 47 mg/dL — ABNORMAL HIGH (ref 8–23)
CO2: 27 mmol/L (ref 22–32)
Calcium: 8.1 mg/dL — ABNORMAL LOW (ref 8.9–10.3)
Chloride: 99 mmol/L (ref 98–111)
Creatinine, Ser: 3.08 mg/dL — ABNORMAL HIGH (ref 0.44–1.00)
GFR, Estimated: 16 mL/min — ABNORMAL LOW (ref >60)
Glucose, Bld: 138 mg/dL — ABNORMAL HIGH (ref 70–99)
Phosphorus: 6.1 mg/dL — ABNORMAL HIGH (ref 2.5–4.6)
Potassium: 4.4 mmol/L (ref 3.5–5.1)
Sodium: 135 mmol/L (ref 135–145)

## 2022-05-28 LAB — GLUCOSE, CAPILLARY
Glucose-Capillary: 152 mg/dL — ABNORMAL HIGH (ref 70–99)
Glucose-Capillary: 186 mg/dL — ABNORMAL HIGH (ref 70–99)
Glucose-Capillary: 214 mg/dL — ABNORMAL HIGH (ref 70–99)
Glucose-Capillary: 178 mg/dL — ABNORMAL HIGH (ref 70–99)

## 2022-05-28 LAB — KAPPA/LAMBDA LIGHT CHAINS
Kappa free light chain: 192 mg/L — ABNORMAL HIGH (ref 3.3–19.4)
Kappa, lambda light chain ratio: 1.61 (ref 0.26–1.65)
Lambda free light chains: 119.5 mg/L — ABNORMAL HIGH (ref 5.7–26.3)

## 2022-05-28 IMAGING — CR DG CHEST 1V
1 series · 1 of 1 positions shown · non-contrast
Comparison: [DATE].

CLINICAL DATA: Follow-up chest radiograph. Status post
thoracentesis.

EXAM:
CHEST  1 VIEW

[chest ap]
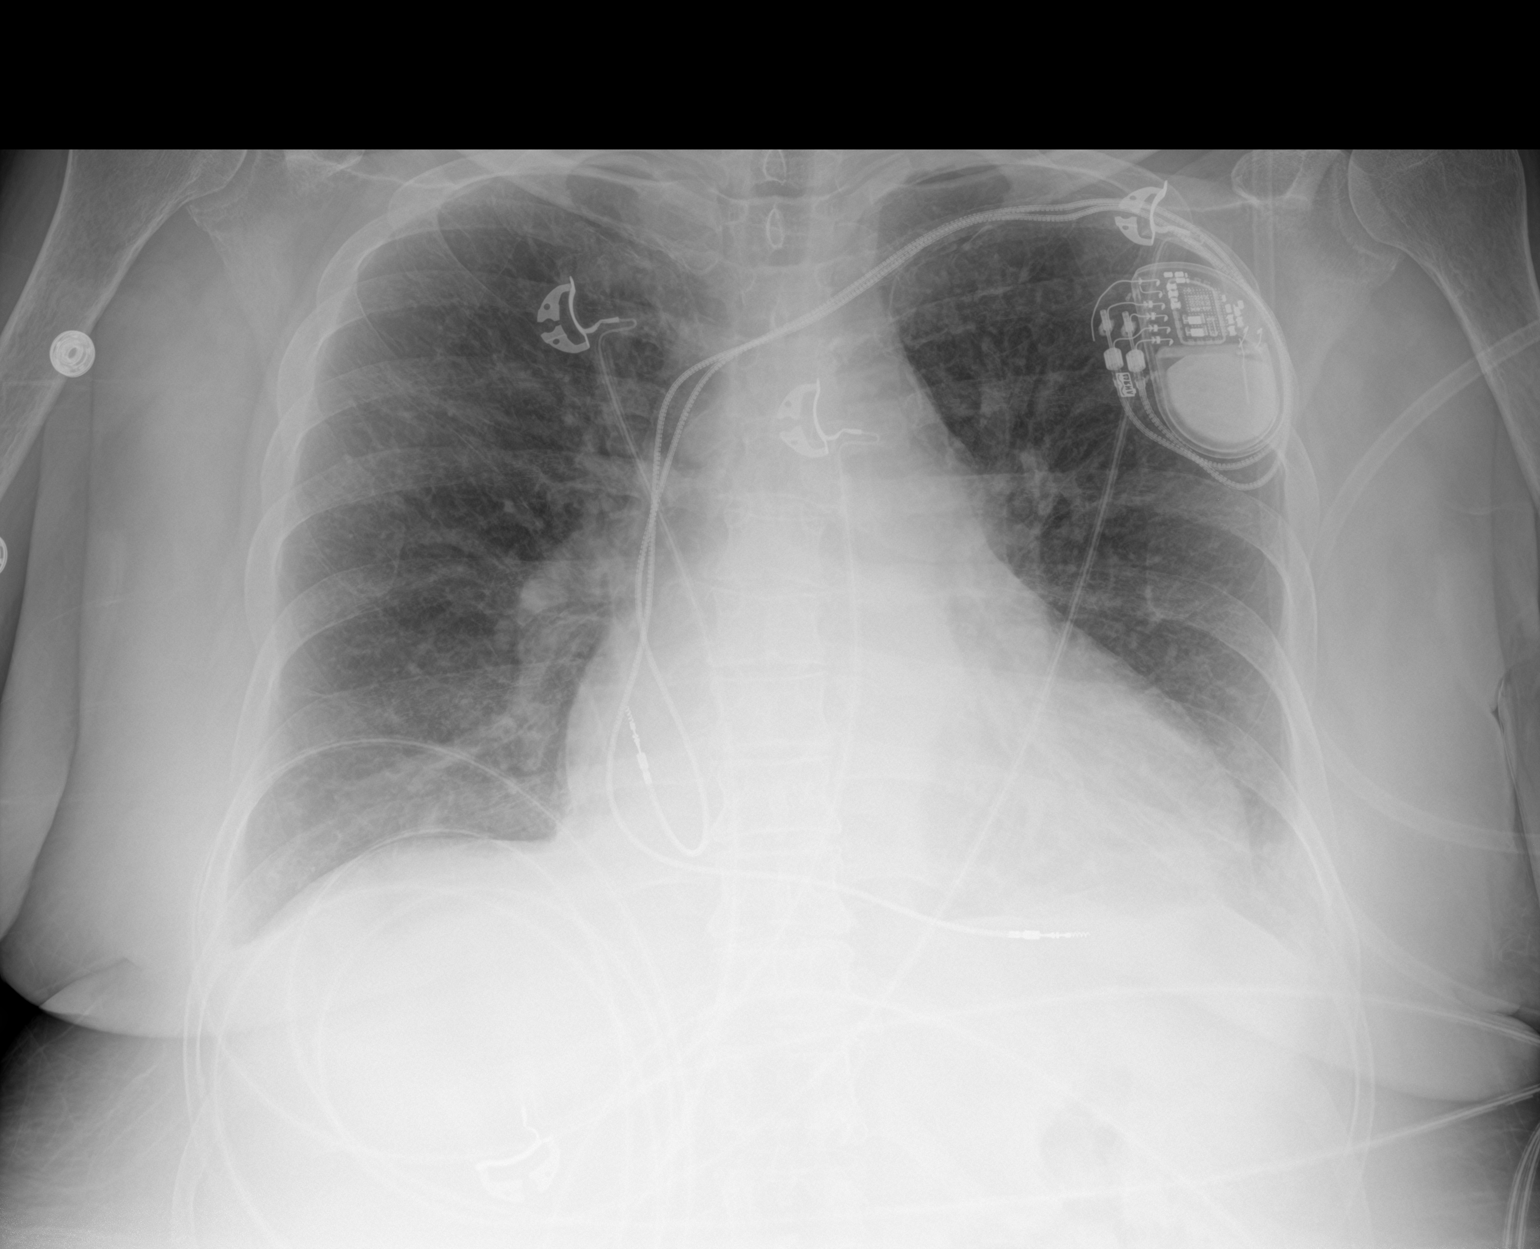

[1 of 1 positions shown; findings below may reference images not displayed]

FINDINGS: Decreased opacity at the right lung base following thoracentesis.
Persistent opacity at the left lung base consistent with a small
effusion and atelectasis.

Persistent bilateral interstitial thickening. No lung consolidation.

No pneumothorax.
IMPRESSION: 1. Decreased right pleural effusion following thoracentesis. No
pneumothorax.

## 2022-05-28 IMAGING — US IR THORACENTESIS ASP PLEURAL SPACE W/IMG GUIDE
1 series · 2 of 2 positions shown · non-contrast
Comparison: none

INDICATION: Congestive heart failure with fluid overload and right pleural
effusion. Request for therapeutic thoracentesis.

[Series 1: ir (person_name)/(person_name) · 2 of 2 slices shown]
[im 1/2]
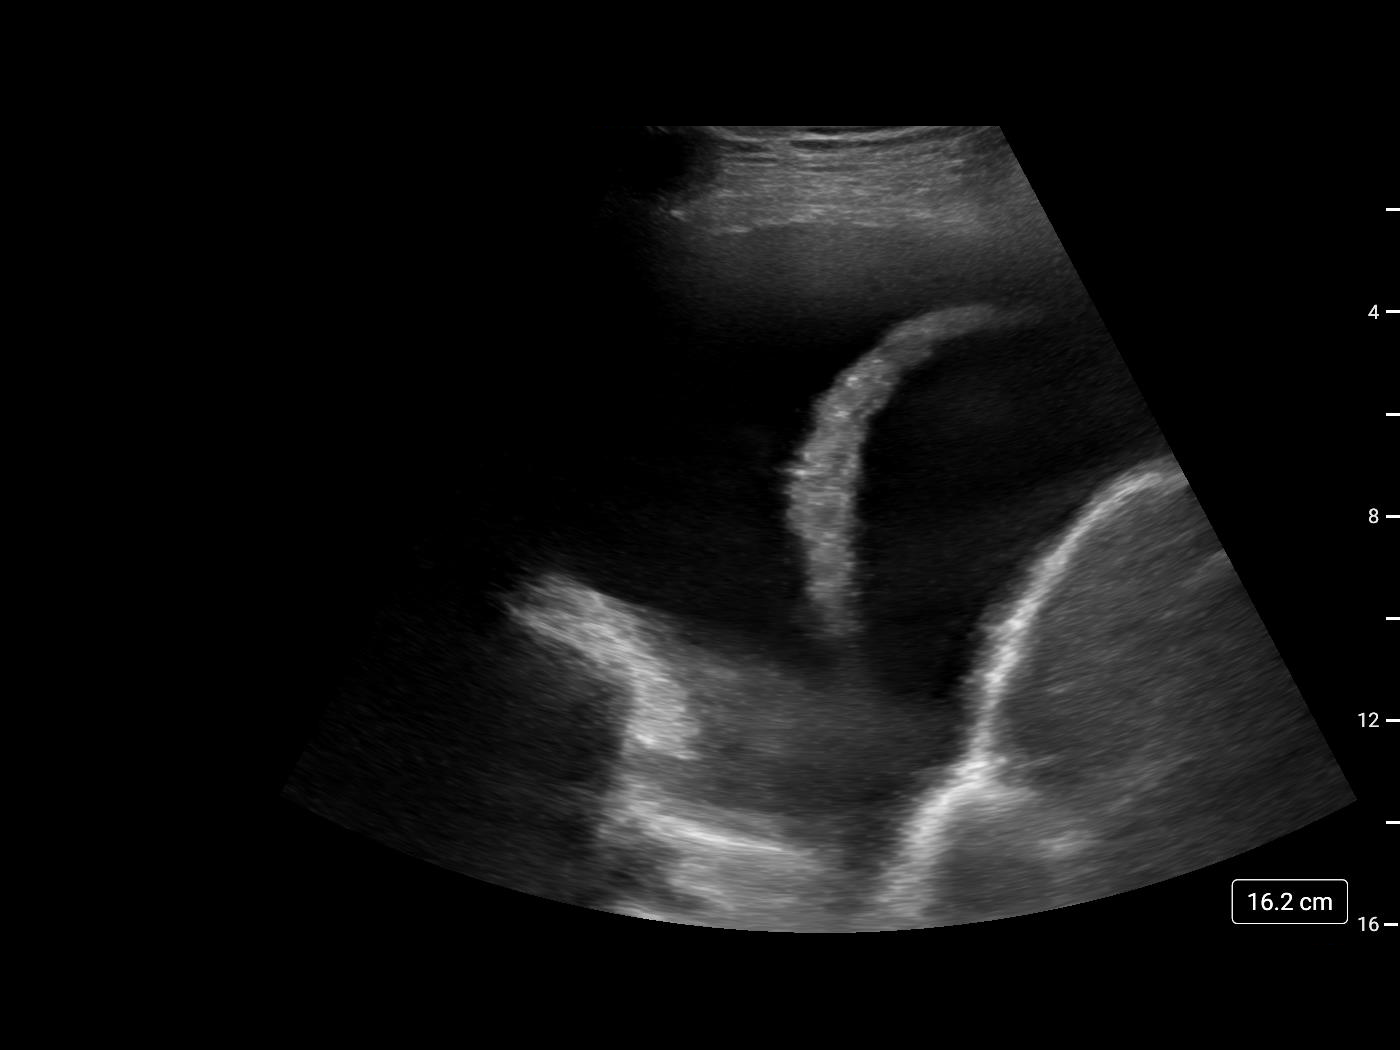
[im 2/2]
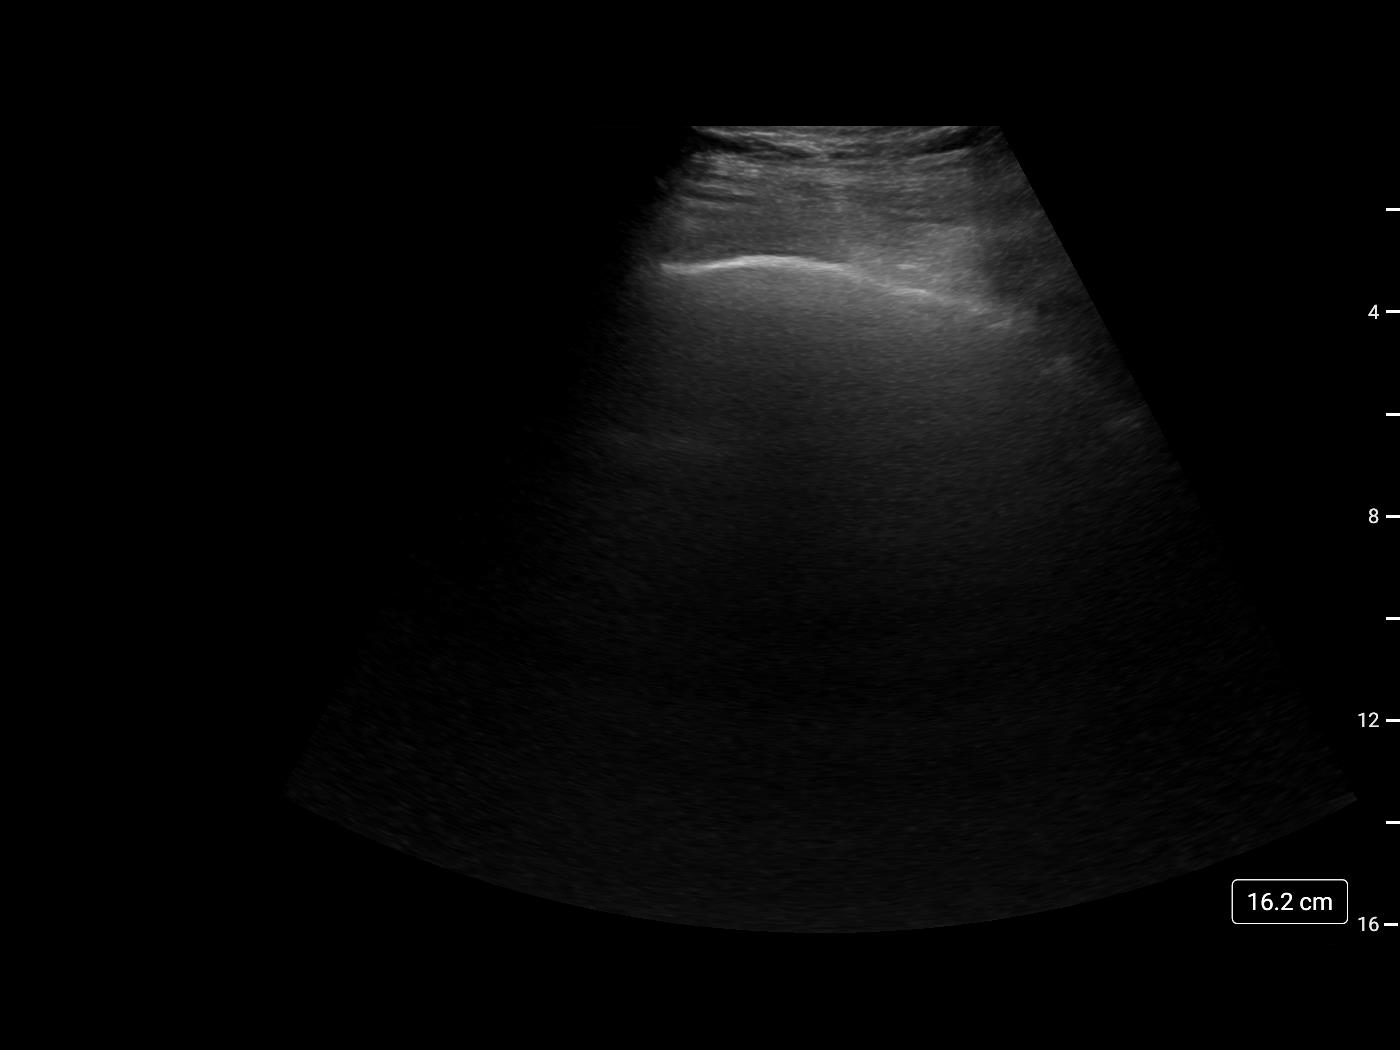

[2 of 2 positions shown; findings below may reference images not displayed]

EXAM:
ULTRASOUND GUIDED RIGHT THORACENTESIS

MEDICATIONS:
1% lidocaine 10 mL

COMPLICATIONS:
None immediate.

PROCEDURE:
An ultrasound guided thoracentesis was thoroughly discussed with the
patient and questions answered. The benefits, risks, alternatives
and complications were also discussed. The patient understands and
wishes to proceed with the procedure. Written consent was obtained.

Ultrasound was performed to localize and mark an adequate pocket of
fluid in the right chest. The area was then prepped and draped in
the normal sterile fashion. 1% Lidocaine was used for local
anesthesia. Under ultrasound guidance a 6 Fr Safe-T-Centesis
catheter was introduced. Thoracentesis was performed. The catheter
was removed and a dressing applied.
FINDINGS: A total of approximately 900 mL of clear yellow fluid was removed.
IMPRESSION: Successful ultrasound guided right thoracentesis yielding 900 mL of
pleural fluid.

No pneumothorax on post-procedure chest x-ray.

## 2022-05-28 MED ORDER — LIDOCAINE HCL 1 % IJ SOLN
INTRAMUSCULAR | Status: AC
  Administered 2022-05-28: 10 mL
  Filled 2022-05-28: qty 20

## 2022-05-28 MED ORDER — FUROSEMIDE 10 MG/ML IJ SOLN
120.0000 mg | Freq: Three times a day (TID) | INTRAVENOUS | Status: DC
  Administered 2022-05-28 – 2022-06-01 (×12): 120 mg via INTRAVENOUS
  Filled 2022-05-28 (×2): qty 10
  Filled 2022-05-28 (×3): qty 12
  Filled 2022-05-28: qty 10
  Filled 2022-05-28: qty 12
  Filled 2022-05-28: qty 10
  Filled 2022-05-28 (×2): qty 12
  Filled 2022-05-28: qty 10
  Filled 2022-05-28: qty 2
  Filled 2022-05-28 (×2): qty 10
  Filled 2022-05-28: qty 12

## 2022-05-28 MED ORDER — SODIUM CHLORIDE 0.9 % IV SOLN: INTRAVENOUS | Status: DC | PRN

## 2022-05-28 MED ORDER — INSULIN GLARGINE-YFGN 100 UNIT/ML ~~LOC~~ SOLN
15.0000 [IU] | Freq: Every day | SUBCUTANEOUS | Status: DC
Start: 2022-05-29 — End: 2022-06-09
  Administered 2022-05-29 – 2022-06-09 (×12): 15 [IU] via SUBCUTANEOUS
  Filled 2022-05-28 (×12): qty 0.15

## 2022-05-28 MED ORDER — INSULIN ASPART 100 UNIT/ML IJ SOLN
3.0000 [IU] | Freq: Three times a day (TID) | INTRAMUSCULAR | Status: DC
  Administered 2022-05-28 – 2022-06-09 (×31): 3 [IU] via SUBCUTANEOUS

## 2022-05-28 NOTE — Progress Notes (Signed)
Brief cardiology follow up  Patient in thoracentesis this AM. Cardiology will follow peripherally over the weekend, please call with any questions that arise.  Buford Dresser, MD, PhD, [facility] [address], [place], [place] [address] [phone]

## 2022-05-28 NOTE — Progress Notes (Signed)
[facility]                                                                              Tracey [facility], is a 71 y.o. female, DOB - Apr 24, 1951, KAJ:[id number] Admit date - 05/19/2022    Outpatient Primary MD for the patient is Theda Sers, Arlyn Leak, FNP  LOS - 8  days  Chief Complaint  Patient presents with   Shortness of Breath       Brief summary   Tracey Bryant is a 71 y.o. female with PMH cholecystitis with cholecystostomy tube in place (initially placed 03/19/2021), afib, HTN, DMII, sCHF, CVA who presented with shortness of breath.  She also had reported increased lower extremity swelling/overload with no improvement on her home Lasix. She underwent thoracentesis on 05/19/2022 removing 1.3 L.  Initially Lasix was held after developing some dizziness. Respiratory status improved after thoracentesis.    She also was evaluated by IR due to difficulty with cholecystostomy tube drainage.  Tube was evaluated and noted intact however suture had been removed but tube had not been retracted any.  After forceful flushing, biliary output was achieved with no further issues. Due to ongoing dizziness, MRI brain was obtained on 5/30 positive for subacute infarct.  Neurology consulted.  Assessment & Plan    Principal Problem:   Acute on chronic combined systolic and diastolic congestive heart failure (HCC) Acute respiratory failure with hypoxia -Underwent thoracentesis on 5/24 with removal of 1.3 L. -Initially Lasix held due to worsening renal function, dizziness however continues to have volume overload with persisting lower extremity edema.  Lasix resumed. -Cardiology, nephrology following, appreciate recommendations, negative balance of 5.7 L -Continue IV Lasix   Moderate to large right-sided pleural effusion -Initial thoracentesis on 5/24 -CXR on 5/31 showed moderate right and small left pleural effusion, ordered right-sided ultrasound guided thoracentesis -Plan for  thoracentesis today by IR  E. coli UTI -Urine culture showed more than 100,000 colonies of E. Coli -Continue IV Rocephin   Acute kidney injury superimposed on stage 4 CKD ([name]) -Underlying CKD likely related to hypertension, DM, currently worsened in context of acute on chronic systolic CHF exacerbation, diuresis, ATN -Appreciate nephrology recommendation, agreeable to dialysis if needed/indicated    Dizziness/syncope, subacute CVA - in the setting of prior history of CVA -MRI brain showed probable small subacute infarct of the inferior left basal ganglia.  Chronic infarcts and chronic microvascular ischemic changes.  Patient declined MRA. -Carotid Dopplers unremarkable. -2D echo showed EF 50 to 55%, grade 2 DD, no shunt. -LDL 52, hemoglobin A1c 9.0. -Continue eliquis     Cholecystitis, chronic -Diagnosed with acute cholecystitis in 02/2021, underwent cholecystostomy tube placement on 03/19/2021 -Tube output had decreased admission, improved with flushing with radiology -Per IR, perc cholecystectomy tube will need to be exchanged before discharge.      Uncontrolled type 2 diabetes mellitus with hyperglycemia, with long-term current use of insulin (HCC) -Hemoglobin A1c 9.0 Recent Labs    05/27/22 0615 05/27/22 1127 05/27/22 1543 05/27/22 2126 05/28/22 0625 05/28/22 1229  GLUCAP 211* 244* 213* 161* 152* 186*   -CBGs in 200s, placed on NovoLog sliding scale insulin,  meal coverage 3 units 3 times daily AC -Increase Semglee to 15 units daily     Paroxysmal atrial fibrillation (HCC) -Rate controlled, continue Coreg, -Continue eliquis   Hypertension -Continue Coreg, Imdur, hydralazine  Rash on the back - ?  Contact dermatitis, placed on hydrocortisone ointment   Obesity Estimated body mass index is 31.67 kg/m as calculated from the following:   Height as of this encounter: '5\' 6"'$  (1.676 m).   Weight as of this encounter: 89 kg.  Code Status: Full CODE STATUS DVT  Prophylaxis:   apixaban (ELIQUIS) tablet 5 mg   Level of Care: Level of care: Telemetry Cardiac Family Communication: Updated patient Disposition Plan:      Remains inpatient appropriate: On IV Lasix diuresis.  Will need skilled nursing facility when medically ready  Procedures:   Right thoracentesis, 05/19/2022 Biliary drain flushing per IR, 05/21/2022 2D echo  Consultants:   Neurology Nephrology Cardiology Interventional radiology  Antimicrobials: None   Medications  apixaban  5 mg Oral BID   carvedilol  6.25 mg Oral BID WC   docusate sodium  100 mg Oral BID   feeding supplement (GLUCERNA SHAKE)  237 mL Oral TID BM   hydrALAZINE  50 mg Oral Q8H   hydrocortisone cream   Topical BID   insulin aspart  0-15 Units Subcutaneous TID WC   insulin aspart  0-5 Units Subcutaneous QHS   insulin glargine-yfgn  12 Units Subcutaneous Daily   isosorbide mononitrate  15 mg Oral Daily   multivitamin with minerals  1 tablet Oral Daily   sodium chloride flush  3 mL Intravenous Q12H      Subjective:   Tracey Bryant was seen and examined today.  Not feeling too good today, awaiting thoracentesis.  Feels short of breath and somewhat dizzy   Objective:   Vitals:   05/28/22 0714 05/28/22 0732 05/28/22 1119 05/28/22 1126  BP:  (!) 123/54 126/63 (!) 132/57  Pulse:  60    Resp: (!) 22 17    Temp:  98 F (36.7 C)    TempSrc:  Oral    SpO2:  98%    Weight: 89 kg     Height:        Intake/Output Summary (Last 24 hours) at 05/28/2022 1432 Last data filed at 05/28/2022 1317 Gross per 24 hour  Intake 836.97 ml  Output 2065 ml  Net -1228.03 ml     Wt Readings from Last 3 Encounters:  05/28/22 89 kg  01/08/22 68 kg  01/07/22 68.4 kg   Physical Exam General: Alert and oriented x 3, short of breath Cardiovascular: S1 S2 clear, RRR. Respiratory: Diminished breath sound at the bases Gastrointestinal: Soft, nontender, nondistended, NBS Ext: 2+ pedal edema bilaterally Neuro: no new  deficits    Data Reviewed:  I have personally reviewed following labs    CBC Lab Results  Component Value Date   WBC 6.7 05/27/2022   RBC 3.61 (L) 05/27/2022   HGB 10.2 (L) 05/27/2022   HCT 32.4 (L) 05/27/2022   MCV 89.8 05/27/2022   MCH 28.3 05/27/2022   PLT 348 05/27/2022   MCHC 31.5 05/27/2022   RDW 15.5 05/27/2022   LYMPHSABS 1.2 05/27/2022   MONOABS 0.6 05/27/2022   EOSABS 0.4 05/27/2022   BASOSABS 0.0 16/09/9603     Last metabolic panel Lab Results  Component Value Date   NA 135 05/28/2022   K 4.4 05/28/2022   CL 99 05/28/2022   CO2 27 05/28/2022   BUN  47 (H) 05/28/2022   CREATININE 3.08 (H) 05/28/2022   GLUCOSE 138 (H) 05/28/2022   GFRNONAA 16 (L) 05/28/2022   GFRAA >90 01/17/2013   CALCIUM 8.1 (L) 05/28/2022   PHOS 6.1 (H) 05/28/2022   PROT 5.3 (L) 05/21/2022   ALBUMIN 2.2 (L) 05/28/2022   BILITOT 0.5 05/21/2022   ALKPHOS 56 05/21/2022   AST 11 (L) 05/21/2022   ALT 9 05/21/2022   ANIONGAP 9 05/28/2022    CBG (last 3)  Recent Labs    05/27/22 2126 05/28/22 0625 05/28/22 1229  GLUCAP 161* 152* 186*      Coagulation Profile: No results for input(s): INR, PROTIME in the last 168 hours.   Radiology Studies: I have personally reviewed the imaging studies  DG Chest 1 View  Result Date: 05/28/2022 CLINICAL DATA:  Follow-up chest radiograph. Status post thoracentesis. EXAM: CHEST  1 VIEW COMPARISON:  05/26/2022. FINDINGS: Decreased opacity at the right lung base following thoracentesis. Persistent opacity at the left lung base consistent with a small effusion and atelectasis. Persistent bilateral interstitial thickening. No lung consolidation. No pneumothorax. IMPRESSION: 1. Decreased right pleural effusion following thoracentesis. No pneumothorax. Electronically Signed   By: Lajean Manes M.D.   On: 05/28/2022 11:48   IR THORACENTESIS ASP PLEURAL SPACE W/IMG GUIDE  Result Date: 05/28/2022 INDICATION: Congestive heart failure with fluid overload and  right pleural effusion. Request for therapeutic thoracentesis. EXAM: ULTRASOUND GUIDED RIGHT THORACENTESIS MEDICATIONS: 1% lidocaine 10 mL COMPLICATIONS: None immediate. PROCEDURE: An ultrasound guided thoracentesis was thoroughly discussed with the patient and questions answered. The benefits, risks, alternatives and complications were also discussed. The patient understands and wishes to proceed with the procedure. Written consent was obtained. Ultrasound was performed to localize and mark an adequate pocket of fluid in the right chest. The area was then prepped and draped in the normal sterile fashion. 1% Lidocaine was used for local anesthesia. Under ultrasound guidance a 6 Fr Safe-T-Centesis catheter was introduced. Thoracentesis was performed. The catheter was removed and a dressing applied. FINDINGS: A total of approximately 900 mL of clear yellow fluid was removed. IMPRESSION: Successful ultrasound guided right thoracentesis yielding 900 mL of pleural fluid. No pneumothorax on post-procedure chest x-ray. Procedure performed Read by: Gareth Eagle, PA-C Electronically Signed   By: Aletta Edouard M.D.   On: 05/28/2022 14:13       Keanen Dohse M.D. [facility] 05/28/2022, 2:32 PM  Available via Epic secure chat 7am-7pm After 7 pm, please refer to night coverage provider listed on amion.

## 2022-05-28 NOTE — Plan of Care (Signed)
  Problem: Skin Integrity: Goal: Risk for impaired skin integrity will decrease Outcome: Progressing   Problem: Safety: Goal: Ability to remain free from injury will improve Outcome: Progressing   Problem: Elimination: Goal: Will not experience complications related to bowel motility Outcome: Progressing Goal: Will not experience complications related to urinary retention Outcome: Progressing   Problem: Coping: Goal: Level of anxiety will decrease Outcome: Progressing   Problem: Activity: Goal: Risk for activity intolerance will decrease Outcome: Progressing

## 2022-05-28 NOTE — Progress Notes (Signed)
Physical Therapy Treatment Patient Details Name: Tracey Bryant MRN: [id number] DOB: 1951-06-18 Today's Date: 05/28/2022   History of Present Illness The pt is a 71 yo female presenting from home 5/24 with SOB and BLE swelling. Found to have acute CHF exacerbation and R pleural effusion s/p thoracentesis 5/24. PMH includes: arthritis, CHF, DM II, DVT, HTN, afib, and stroke.    PT Comments    Patient seen with focus of session on general strengthening, upright tolerance, sitting balance/postural support and symptom management.  She does demonstrate symptoms of asterixis with her extremities at times difficulty with motor persistence.  She could have the same issue with her postural muscles causing some of the symptoms of falling backward.  Symptoms are NOT consistent with BPPV as she experiences episodes without any change in head position.  She did stand today but did not tolerate long so returned to sitting then fall back needing max A to recover.  She also seems to be limited by her fear of falling which causes her to simply go limp when she feels herself losing control.  She does not "pass out" as she is easy to redirect with cues for focal target.  She remains appropriate for STSNF level rehab at d/c.  PT will continue to follow acutely.   Recommendations for follow up therapy are one component of a multi-disciplinary discharge planning process, led by the attending physician.  Recommendations may be updated based on patient status, additional functional criteria and insurance authorization.  Follow Up Recommendations  Skilled nursing-short term rehab (<3 hours/day)     Assistance Recommended at Discharge Frequent or constant Supervision/Assistance  Patient can return home with the following Two people to help with walking and/or transfers;Two people to help with bathing/dressing/bathroom;Assistance with cooking/housework;Direct supervision/assist for medications management;Direct  supervision/assist for financial management;Assist for transportation;Help with stairs or ramp for entrance   Equipment Recommendations  None recommended by PT    Recommendations for Other Services       Precautions / Restrictions Precautions Precautions: Fall Precaution Comments: psuedo-syncopal episodes with supine to sit (hold onto her so she doesn't fall over)     Mobility  Bed Mobility Overal bed mobility: Needs Assistance Bed Mobility: Supine to Sit   Sidelying to sit: Mod assist, HOB elevated   Sit to supine: Min assist   General bed mobility comments: assist for trunk to stabilize with lifting and to supine cues for technique and assist for positioning    Transfers Overall transfer level: Needs assistance   Transfers: Sit to/from Stand, Bed to chair/wheelchair/BSC Sit to Stand: Mod assist          Lateral/Scoot Transfers: Mod assist General transfer comment: stood x 1 and then knees buckled and landed on bed and experienced retropulsion mod to max A to rebalance; lateral scooting along EOB with lifting hips and moving toward R side x 3 reps with mod A and cues.    Ambulation/Gait                   Stairs             Wheelchair Mobility    Modified Rankin (Stroke Patients Only) Modified Rankin (Stroke Patients Only) Pre-Morbid Rankin Score: No significant disability Modified Rankin: Moderately severe disability     Balance Overall balance assessment: Needs assistance   Sitting balance-Leahy Scale: Poor Sitting balance - Comments: needs up to mod A at times due to pt with "episodes" of feeling "dizzy" versus "passing out" and at  times becomes retropulsive needs cues and support to refocus and remain aroused.  BP stable from supine to sitting, but pt needs about 30-60 seconds to settle herself once on EOB before improves balance.  Several more episodes while on EOB when she is working on exercises or postural control or looking at birds out  the window again needing about 30 seconds to recover with mod A for balance                                    Cognition Arousal/Alertness: Awake/alert Behavior During Therapy: WFL for tasks assessed/performed Overall Cognitive Status: Impaired/Different from baseline Area of Impairment: Memory, Problem solving, Safety/judgement                     Memory: Decreased short-term memory   Safety/Judgement: Decreased awareness of safety, Decreased awareness of deficits Awareness: Intellectual Problem Solving: Slow processing, Requires verbal cues          Exercises General Exercises - Upper Extremity Shoulder Horizontal ABduction: Strengthening, Both, 10 reps, Seated, AAROM, AROM General Exercises - Lower Extremity Ankle Circles/Pumps: AROM, 10 reps, Both, Supine Short Arc Quad: Strengthening, Both, 10 reps, Supine (w/ 5 sec hold) Heel Slides: AROM, AAROM, Both, 10 reps, Supine Hip ABduction/ADduction: Strengthening, Both, 10 reps, Supine (pillow squeezes with 5 sec hold) Other Exercises Other Exercises: seated trunk extension x 5 reps wtih 10 sec hold    General Comments        Pertinent Vitals/Pain Pain Assessment Faces Pain Scale: Hurts little more Pain Location: perineal area due to purewick Pain Descriptors / Indicators: Sore Pain Intervention(s): Monitored during session, Other (comment) (NT made aware)    Home Living                          Prior Function            PT Goals (current goals can now be found in the care plan section) Progress towards PT goals: Progressing toward goals    Frequency    Min 3X/week      PT Plan Current plan remains appropriate    Co-evaluation              AM-PAC PT "6 Clicks" Mobility   Outcome Measure  Help needed turning from your back to your side while in a flat bed without using bedrails?: A Little Help needed moving from lying on your back to sitting on the side of a flat  bed without using bedrails?: A Lot Help needed moving to and from a bed to a chair (including a wheelchair)?: Total Help needed standing up from a chair using your arms (e.g., wheelchair or bedside chair)?: A Lot Help needed to walk in hospital room?: Total Help needed climbing 3-5 steps with a railing? : Total 6 Click Score: 10    End of Session Equipment Utilized During Treatment: Gait belt Activity Tolerance: Patient limited by fatigue Patient left: in bed;with call bell/phone within reach;with bed alarm set   PT Visit Diagnosis: Other abnormalities of gait and mobility (R26.89);Muscle weakness (generalized) (M62.81)     Time: 7026-3785 PT Time Calculation (min) (ACUTE ONLY): 38 min  Charges:  $Therapeutic Exercise: 23-37 mins $Therapeutic Activity: 8-22 mins                     Magda Kiel, PT Acute Rehabilitation  Services Pager:[phone] [phone] 05/28/2022    Reginia Naas 05/28/2022, 5:56 PM

## 2022-05-28 NOTE — Progress Notes (Signed)
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    Patient was recently admitted 12/25/21 - 12/27/21 for CHF exacerbation - diuresed with Lasix IV 40 mg BID with total of -4.58 L volume removed.  Echo in December 2022 showed improvement in LVEF 40-45% (25-30% in 02/2021) with global hypokinesis, normal RV, and moderately elevated pulmonary pressures.  Discharge weight was 162 lbs.  Discharged on carvedilol 6.25 mg BID and furosemide 40 mg daily and was instructed to stop amlodipine 10 mg daily and BiDIl 20-37.5 mg TID.   On 05/24, she presented to her Cardiology office thinking she had an appointment and endorsed SOB, bilateral LEE, and reduced activity tolerance; MD referred her to [facility] ED.  Patient endorsed progressive symptoms despite recent increase Lasix 40 mg daily > BID at cardiology visit 05/09 where she reported worsening LEE, SOB, orthopnea, and PND.  CXR in the ED showed moderate R pleural effusion; dual PPM evident.  Repeat CXR 6h later demonstrated improvement in R pleural effusion, cardiomegaly and no evidence of interstitial edema.  Underwent R thoracentesis with 1.3 L removed.  She was started on IV diuresis 05/24, received Lasix IV 40 mg x1 and 60 mg x2 with marginal response , but her 1+ bilateral LE pitting edema was improved.    Breathing had improved on 05/25 s/p thoracentesis and diuresis, but renal function worsened (SCr 2.13 > 2.37, baseline ~1.6) and patient reported dizziness so diuretics were held.  Echo on 05/25 showed improvement in LVEF to 50-55% and elevated LVEDP (21) with new G2DD; still with normal RV and mildly elevated pulmonary pressures.    Patient continued to endorse SOB, feeling  swollen and fatigued w/ bilateral 2+ pitting LEE and JVD over the weekend (05/27-28).  She had minimal response of 550cc to Lasix IV 40 mg BID and was given 80 mg x1; nephrology also consulted and redosed 80 mg x1 the following day after ~1.4 L uop and worsening renal function (SCr 3.0 > 3.19).    She had a near syncopal event on 05/30 while sitting on the bed to perform orthostatics, was SOB while speaking and appeared more volume overloaded on exam.  JVD and bilateral 2+ pitting edema still present - nephrology increased Lasix to 80 mg IV x2.  She was taken for a MRI of brain 05/30 which revealed a probable small subacute infarct.    Repeat CXR 5/31 showed stable cardiomegaly with moderate right and small left pleural effusions.  Possible plans for HD if she does not make an adequate amount of urine with aggressive diuresis  Current HF Medications: Diuretic: furosemide IV 120 mg q8h per nephrology Beta Blocker: carvedilol 6.25 mg BID Other: hydralazine 50 mg q8h + Imdur 15 mg daily  Prior to admission HF Medications: Diuretic: furosemide 40 mg BID Beta blocker: carvedilol 6.25 mg BID Other: hydralazine 25 mg BID + Imdur 15 mg daily ; potassium 20 mEq BID  Pertinent Lab Values: Serum creatinine 3.08, BUN 47, Potassium 4.4, Sodium 135, BNP 2083.9, Magnesium 2.9, A1c 9.1% (up from 7.7% in Jan)  Vital Signs: Weight: 196 lbs (admission weight: 150 lbs - not accurate) Blood pressure: 130/60s Heart rate: 60s I/O: -928mL yesterday; net -5.7L   Medication Assistance /  Insurance Benefits Check: Does the patient have prescription insurance?  Yes Type of insurance plan: [name]  Does the patient qualify for medication assistance through manufacturers or grants?   Yes Eligible grants and/or patient assistance programs: pending initiation of ARNI/SGLT2i Medication assistance applications in progress: pending  Medication assistance applications approved: pending  Outpatient Pharmacy:   Prior to admission outpatient pharmacy: [facility] Is the patient willing to use [facility] pharmacy at discharge? Pending Is the patient willing to transition their outpatient pharmacy to utilize a [facility] outpatient pharmacy?   Pending    Assessment: 1. 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 NYHA class IV symptoms. Breathing improved today. Almost able to lay flat. No JVD and persistent 2+ pitting bilateral LEE on exam.   GDMT initiation/titration limited by poor renal function.  - Diuresing with Lasix IV 120 mg q8h per renal  - Continue PTA carvediolol 6.25 mg BID - No ACEi/ARB/ARNI or MRA with significant renal dysfunction  - No SGLT2i with h/o UTI during March-April 2022 admission, A1c up 7.7% > 9.1% since Jan, and eGFR <20. UA this admission with many bacteria, nitrite negative. UCx with >100k ecoli - treating with ceftriaxone - Continue PTA hydralazine 50 mg q8h + Imdur 15 mg daily    Plan: 1) Medication changes recommended at this time: - Continue regimen as above - Maintain Mg > 2 and K > 4 - Stop PTA amlodipine on discharge to allow for BP room with GDMT initiation/titration pending renal function improvement  2) Patient assistance: [name] = $47  3)  Education  - To be completed prior to discharge  Kerby Nora, PharmD, BCPS Heart Failure Stewardship Pharmacist Phone [phone]  Please check [email or web address] for unit-specific pharmacist phone numbers

## 2022-05-28 NOTE — Procedures (Signed)
PROCEDURE SUMMARY:  Successful US guided right thoracentesis. Yielded 900 mL of clear yellow fluid. Patient tolerated procedure well. No immediate complications. EBL = trace  Post procedure chest X-ray reveals no pneumothorax  [name] PA-C 05/28/2022 2:09 PM

## 2022-05-28 NOTE — Progress Notes (Signed)
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 Underlying CKD likely related to HTN and DM. Also, w/ Ecoli UTI--receiving rocephin -Cr stable today. Remains nonoliguric -will redose her lasix today: '120mg'$  TID, will leave her on this for now -Still trying to avoid dialysis and optimize medical management/diuresis for her. 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 Would hold off on biopsy for now especially given resp status and I can't foresee her laying flat for a biopsy. Management at this time would not change if we were to do a biopsy. Can certainly consider this as an outpatient if needed, see below -Avoid nephrotoxic medications including NSAIDs and iodinated intravenous contrast exposure unless the latter is absolutely indicated.  Preferred narcotic agents for pain control are hydromorphone, fentanyl, and methadone. Morphine should not be used. Avoid Baclofen and avoid oral sodium phosphate and magnesium citrate based laxatives / bowel preps. Continue strict Input and Output monitoring. Will monitor the patient closely with you and intervene or adjust therapy as indicated by changes in clinical status/labs   Acute on chronic sCHF exacerbation -cardiology following -diuresis as above -may need another CXR +/- thora? Will defer to primary service about this  Dizziness/syncope -secondary to subacute basal ganglia infarct as seen on MRI +/- BPPV -mgmt per primary. This sounds like it's from BPPV as opposed to orthostatic issues  Ecoli UTI -on rocephin  HTN -BP w/ reasonable control  Proteinuria -suspecting this is more so related to DM -has seen rheum in the past-ruled out lupus. HIV neg in 07/2021 -will check secondary/serologic work up while she's here (hep bsag neg, hep sab neg, hep c  ab neg, FLC/SPEP pending)  Pleural effusion secondary to CHF -s/p rt thora 5/24 -repeat CXR/thora?  Choleycystitis -per primary service  Anemia -hgb at goal for CKD  DM2 w/ hyperglycemia -DM mgmt per primary service  Subjective:   Patient seen and examined this AM.  Patient reports that her breathing is much better today which she was quite surprised about.  She reports that she was able to sleep throughout the night without waking up gasping for air.  However, with movement during my encounter with her she became dyspneic intermittently. Still having issues with dizziness.  Started on Rocephin yesterday for E. coli UTI.  No other complaints. Uop 1.2L   Objective:   BP (!) 123/54 (BP Location: Left Arm)   Pulse 60   Temp 98 F (36.7 C) (Oral)   Resp 17   Ht '5\' 6"'$  (1.676 m)   Wt 89 kg   SpO2 98%   BMI 31.67 kg/m   Intake/Output Summary (Last 24 hours) at 05/28/2022 0931 Last data filed at 05/28/2022 6546 Gross per 24 hour  Intake 720.97 ml  Output 2065 ml  Net -1344.03 ml   Weight change:   Physical Exam: Gen: NAD, sitting up in bed CVS:s1s2, rrr Resp:decreased breath sounds bibasilar, no overt w/r/r/c, unable to speak in full sentences TKP:TWSF Ext:2+ pitting edema b/l Les (unchanged) Neuro: awake, alert, no asterixis  Imaging: VAS US CAROTID  Result Date: 05/26/2022 Carotid Arterial Duplex Study Patient Name:  Tracey Bryant  Date of Exam:   05/26/2022 Medical Rec #: [id number]        Accession #:    [id number] Date of Birth: 05-27-1951  Patient Gender: F Patient Age:   71 years Exam Location:  [facility] Procedure:      VAS US CAROTID Referring Phys: Cornelius Moras XU --------------------------------------------------------------------------------  Indications:       CVA. Risk Factors:      Hypertension, coronary artery disease, prior CVA. Comparison Study:  04/04/21 - carotid - minimal thickening or plaque bilaterally. Performing Technologist: Oda Cogan  RDMS, RVT  Examination Guidelines: A complete evaluation includes B-mode imaging, spectral Doppler, color Doppler, and power Doppler as needed of all accessible portions of each vessel. Bilateral testing is considered an integral part of a complete examination. Limited examinations for reoccurring indications may be performed as noted.  Right Carotid Findings: +----------+--------+--------+--------+------------------+--------+           PSV cm/sEDV cm/sStenosisPlaque DescriptionComments +----------+--------+--------+--------+------------------+--------+ CCA Prox  70      14                                         +----------+--------+--------+--------+------------------+--------+ CCA Distal59      17                                         +----------+--------+--------+--------+------------------+--------+ ICA Prox  70      22      1-39%   calcific                   +----------+--------+--------+--------+------------------+--------+ ICA Distal68      23                                         +----------+--------+--------+--------+------------------+--------+ ECA       98      14                                         +----------+--------+--------+--------+------------------+--------+ +----------+--------+-------+----------------+-------------------+           PSV cm/sEDV cmsDescribe        Arm Pressure (mmHG) +----------+--------+-------+----------------+-------------------+ [id number]            Multiphasic, WNL                    +----------+--------+-------+----------------+-------------------+ +---------+--------+--+--------+-+---------+ VertebralPSV cm/s27EDV cm/s8Antegrade +---------+--------+--+--------+-+---------+  Left Carotid Findings: +----------+--------+--------+--------+------------------+--------+           PSV cm/sEDV cm/sStenosisPlaque DescriptionComments +----------+--------+--------+--------+------------------+--------+ CCA  Prox  76      18                                         +----------+--------+--------+--------+------------------+--------+ CCA Distal62      19                                         +----------+--------+--------+--------+------------------+--------+ ICA Prox  56      20                                         +----------+--------+--------+--------+------------------+--------+  ICA Distal68      27                                         +----------+--------+--------+--------+------------------+--------+ ECA       76      10                                         +----------+--------+--------+--------+------------------+--------+ +----------+--------+--------+----------------+-------------------+           PSV cm/sEDV cm/sDescribe        Arm Pressure (mmHG) +----------+--------+--------+----------------+-------------------+ [id number]              Multiphasic, WNL                    +----------+--------+--------+----------------+-------------------+ +---------+--------+--+--------+--+---------+ VertebralPSV cm/s29EDV cm/s10Antegrade +---------+--------+--+--------+--+---------+   Summary: Right Carotid: Velocities in the right ICA are consistent with a 1-39% stenosis. Left Carotid: The extracranial vessels were near-normal with only minimal wall               thickening or plaque. Vertebrals: Bilateral vertebral arteries demonstrate antegrade flow. *See table(s) above for measurements and observations.  Electronically signed by Antony Contras MD on 05/26/2022 at 4:53:09 PM.    Final     Labs: BMET Recent Labs  Lab 05/22/22 4235 05/23/22 3614 05/24/22 4315 05/25/22 0411 05/26/22 0313 05/27/22 0402 05/28/22 0426  NA 137 136 135 132* 136 134* 135  K 4.3 4.8 4.6 4.7 4.8 5.1 4.4  CL 105 105 101 101 101 101 99  CO2 '24 24 25 22 27 26 27  '$ GLUCOSE 192* 202* 230* 151* 141* 195* 138*  BUN 25* 30* 34* 36* 40* 43* 47*  CREATININE 2.79* 3.00* 3.19* 3.01*  3.10* 3.24* 3.08*  CALCIUM 8.0* 8.2* 8.2* 8.2* 8.2* 8.1* 8.1*  PHOS  --   --  4.4 4.7* 5.9* 6.3* 6.1*   CBC Recent Labs  Lab 05/24/22 0207 05/25/22 0411 05/26/22 0313 05/27/22 0402  WBC 6.7 7.3 5.9 6.7  NEUTROABS 4.5 4.8 3.8 4.4  HGB 10.8* 10.3* 10.1* 10.2*  HCT 33.3* 32.7* 31.5* 32.4*  MCV 90.0 90.1 90.0 89.8  PLT 327 334 287 348    Medications:     apixaban  5 mg Oral BID   carvedilol  6.25 mg Oral BID WC   docusate sodium  100 mg Oral BID   feeding supplement (GLUCERNA SHAKE)  237 mL Oral TID BM   hydrALAZINE  50 mg Oral Q8H   hydrocortisone cream   Topical BID   insulin aspart  0-15 Units Subcutaneous TID WC   insulin aspart  0-5 Units Subcutaneous QHS   insulin glargine-yfgn  12 Units Subcutaneous Daily   isosorbide mononitrate  15 mg Oral Daily   multivitamin with minerals  1 tablet Oral Daily   sodium chloride flush  3 mL Intravenous Q12H      Gean Quint, MD [facility] 05/28/2022, 9:31 AM

## 2022-05-29 LAB — RENAL FUNCTION PANEL
Albumin: 2.2 g/dL — ABNORMAL LOW (ref 3.5–5.0)
Anion gap: 11 (ref 5–15)
BUN: 51 mg/dL — ABNORMAL HIGH (ref 8–23)
CO2: 29 mmol/L (ref 22–32)
Calcium: 8.3 mg/dL — ABNORMAL LOW (ref 8.9–10.3)
Chloride: 98 mmol/L (ref 98–111)
Creatinine, Ser: 3.07 mg/dL — ABNORMAL HIGH (ref 0.44–1.00)
GFR, Estimated: 16 mL/min — ABNORMAL LOW (ref >60)
Glucose, Bld: 111 mg/dL — ABNORMAL HIGH (ref 70–99)
Phosphorus: 5.4 mg/dL — ABNORMAL HIGH (ref 2.5–4.6)
Potassium: 3.7 mmol/L (ref 3.5–5.1)
Sodium: 138 mmol/L (ref 135–145)

## 2022-05-29 LAB — GLUCOSE, CAPILLARY
Glucose-Capillary: 110 mg/dL — ABNORMAL HIGH (ref 70–99)
Glucose-Capillary: 178 mg/dL — ABNORMAL HIGH (ref 70–99)
Glucose-Capillary: 198 mg/dL — ABNORMAL HIGH (ref 70–99)
Glucose-Capillary: 162 mg/dL — ABNORMAL HIGH (ref 70–99)

## 2022-05-29 MED ORDER — CEPHALEXIN 250 MG PO CAPS
500.0000 mg | ORAL_CAPSULE | Freq: Two times a day (BID) | ORAL | Status: AC
Start: 2022-05-29 — End: 2022-06-01
  Administered 2022-05-29 – 2022-05-31 (×5): 500 mg via ORAL
  Filled 2022-05-29 (×6): qty 2

## 2022-05-29 NOTE — Progress Notes (Addendum)
[facility]                                                                              Tracey Bryant, is a 71 y.o. female, DOB - Nov 19, 1951, XBL:[id number] Admit date - 05/19/2022    Outpatient Primary MD for the patient is Coolidge Breeze, FNP  LOS - 9  days  Chief Complaint  Patient presents with   Shortness of Breath       Brief summary   Tracey Bryant is a 71 y.o. female with PMH cholecystitis with cholecystostomy tube in place (initially placed 03/19/2021), afib, HTN, DMII, sCHF, CVA who presented with shortness of breath.  She also had reported increased lower extremity swelling/overload with no improvement on her home Lasix.  She underwent thoracentesis on 05/19/2022 removing 1.3 L.  Initially Lasix was held after developing some dizziness.Respiratory status improved after thoracentesis.    She also was evaluated by IR due to difficulty with cholecystostomy tube drainage.  Tube was evaluated and noted intact however suture had been removed but tube had not been retracted any.  After forceful flushing, biliary output was achieved with no further issues.  Due to ongoing dizziness, MRI brain was obtained on 5/30 positive for subacute infarct.  Neurology consulted.  6/2: repeat thoracentesis with IR, 900 cc drained, symptomatic improvement   Assessment & Plan    Principal Problem:   Acute on chronic combined systolic and diastolic congestive heart failure (HCC) Acute respiratory failure with hypoxia Underwent thoracentesis on 5/24 with removal of 1.3 L with symptomatic improvement. Initially Lasix held due to worsening renal function, dizziness however continues to have volume overload with persisting lower extremity edema.  Lasix resumed.  She underwent repeat thoracentesis on 6/2 with notable symptomatic improvement again.  She reports she was able to sleep flat on her back for the first time in a while. EF on most recent echo notably 50-55%, grade II  diastolic heart failure. Suspected pt gained fluid over a significant amount of time.  -Cardiology, nephrology following, appreciate recommendations, negative balance of 6.8 L -Continue IV Lasix per renal 120 mg TID -coreg 6.25 mg BID -hydral 50 q8h, imdur 15 qD -holding sglt2i  Moderate to large right-sided pleural effusion Initial thoracentesis on 5/24. CXR on 5/31 showed moderate right and small left pleural effusion, ordered right-sided ultrasound guided thoracentesis, completed 6/2.   E. coli UTI Urine culture showed more than 100,000 colonies of E. Coli, resistant to ampicillin.  -IV Rocephin 6/1-6/2, transition to PO keflex 500 mg BID   Acute kidney injury superimposed on stage 4 CKD ([place]) Underlying CKD likely related to hypertension, DM, currently worsened in context of acute on chronic systolic CHF exacerbation, diuresis, ATN -Appreciate nephrology recommendation, agreeable to dialysis if needed/indicated  Dizziness/syncope, subacute CVA prior history of CVA MRI brain showed probable small subacute infarct of the inferior left basal ganglia.  Chronic infarcts and chronic microvascular ischemic changes.  Patient declined MRA. -Carotid Dopplers unremarkable -2D echo showed EF 50 to 55%, grade 2 DD, no shunt. -LDL 52, hemoglobin A1c 9.0. -Continue eliquis  Cholecystitis, chronic -Diagnosed with acute cholecystitis in 02/2021, underwent cholecystostomy  tube placement on 03/19/2021. Tube output had decreased admission, improved with flushing with radiology -Per IR, perc cholecystectomy tube will need to be exchanged before discharge.  Uncontrolled type 2 diabetes mellitus with hyperglycemia, with long-term current use of insulin (HCC) -Hemoglobin A1c 9.0 Recent Labs    05/28/22 1229 05/28/22 1634 05/28/22 2042 05/29/22 0554 05/29/22 1139 05/29/22 1633  GLUCAP 186* 214* 178* 110* 178* 198*   -CBGs in 200s, placed on NovoLog sliding scale insulin, meal coverage 3 units 3  times daily AC -Semglee to 15 units daily 6/2, much improved   Paroxysmal atrial fibrillation (HCC) -Rate controlled, continue Coreg, -Continue eliquis  chronic anemia, is a Jehovah's witness, would not want transfusion; no active bleeding noted   Hypertension -Continue Coreg, Imdur, hydralazine  Rash on the back - ?  Contact dermatitis, placed on hydrocortisone ointment  Obesity Estimated body mass index is 31.78 kg/m as calculated from the following:   Height as of this encounter: '5\' 6"'$  (1.676 m).   Weight as of this encounter: 89.3 kg.  Code Status: Full CODE STATUS DVT Prophylaxis:   apixaban (ELIQUIS) tablet 5 mg   Level of Care: Level of care: Telemetry Cardiac Family Communication: Updated patient Disposition Plan:      Remains inpatient appropriate: On IV Lasix diuresis.  Will need skilled nursing facility when medically ready  Procedures:   Right thoracentesis, 05/19/2022 Biliary drain flushing per IR, 05/21/2022 2D echo  Consultants:   Neurology Nephrology Cardiology Interventional radiology  Antimicrobials: None   Medications  apixaban  5 mg Oral BID   carvedilol  6.25 mg Oral BID WC   cephALEXin  500 mg Oral Q12H   docusate sodium  100 mg Oral BID   feeding supplement (GLUCERNA SHAKE)  237 mL Oral TID BM   hydrALAZINE  50 mg Oral Q8H   hydrocortisone cream   Topical BID   insulin aspart  0-15 Units Subcutaneous TID WC   insulin aspart  0-5 Units Subcutaneous QHS   insulin aspart  3 Units Subcutaneous TID WC   insulin glargine-yfgn  15 Units Subcutaneous Daily   isosorbide mononitrate  15 mg Oral Daily   multivitamin with minerals  1 tablet Oral Daily   sodium chloride flush  3 mL Intravenous Q12H      Subjective:   Tracey Bryant was seen and examined today. Feels much better after thoracentesis.    Objective:   Vitals:   05/29/22 0557 05/29/22 0742 05/29/22 1140 05/29/22 1634  BP: (!) 130/53 120/60 137/60 135/61  Pulse:  62 61    Resp:  '15 18 15  '$ Temp:  97.8 F (36.6 C) 97.8 F (36.6 C) 98.2 F (36.8 C)  TempSrc:  Oral Oral   SpO2:  95% 93% 98%  Weight:      Height:        Intake/Output Summary (Last 24 hours) at 05/29/2022 1656 Last data filed at 05/29/2022 1635 Gross per 24 hour  Intake 376.34 ml  Output 2800 ml  Net -2423.66 ml     Wt Readings from Last 3 Encounters:  05/29/22 89.3 kg  01/08/22 68 kg  01/07/22 68.4 kg   Physical Exam General: Alert and oriented x 3, short of breath Cardiovascular: S1 S2 clear, RRR. Respiratory: Diminished breath sound at the bases Gastrointestinal: Soft, nontender, nondistended, NBS Ext: 2+ pedal edema bilaterally Neuro: no new deficits    Data Reviewed:  I have personally reviewed following labs    CBC Lab Results  Component Value  Date   WBC 6.7 05/27/2022   RBC 3.61 (L) 05/27/2022   HGB 10.2 (L) 05/27/2022   HCT 32.4 (L) 05/27/2022   MCV 89.8 05/27/2022   MCH 28.3 05/27/2022   PLT 348 05/27/2022   MCHC 31.5 05/27/2022   RDW 15.5 05/27/2022   LYMPHSABS 1.2 05/27/2022   MONOABS 0.6 05/27/2022   EOSABS 0.4 05/27/2022   BASOSABS 0.0 37/85/8850     Last metabolic panel Lab Results  Component Value Date   NA 138 05/29/2022   K 3.7 05/29/2022   CL 98 05/29/2022   CO2 29 05/29/2022   BUN 51 (H) 05/29/2022   CREATININE 3.07 (H) 05/29/2022   GLUCOSE 111 (H) 05/29/2022   GFRNONAA 16 (L) 05/29/2022   GFRAA >90 01/17/2013   CALCIUM 8.3 (L) 05/29/2022   PHOS 5.4 (H) 05/29/2022   PROT 5.3 (L) 05/21/2022   ALBUMIN 2.2 (L) 05/29/2022   BILITOT 0.5 05/21/2022   ALKPHOS 56 05/21/2022   AST 11 (L) 05/21/2022   ALT 9 05/21/2022   ANIONGAP 11 05/29/2022    CBG (last 3)  Recent Labs    05/29/22 0554 05/29/22 1139 05/29/22 1633  GLUCAP 110* 178* 198*      Coagulation Profile: No results for input(s): INR, PROTIME in the last 168 hours.   Radiology Studies: I have personally reviewed the imaging studies  DG Chest 1 View  Result Date:  05/28/2022 CLINICAL DATA:  Follow-up chest radiograph. Status post thoracentesis. EXAM: CHEST  1 VIEW COMPARISON:  05/26/2022. FINDINGS: Decreased opacity at the right lung base following thoracentesis. Persistent opacity at the left lung base consistent with a small effusion and atelectasis. Persistent bilateral interstitial thickening. No lung consolidation. No pneumothorax. IMPRESSION: 1. Decreased right pleural effusion following thoracentesis. No pneumothorax. Electronically Signed   By: Lajean Manes M.D.   On: 05/28/2022 11:48   IR THORACENTESIS ASP PLEURAL SPACE W/IMG GUIDE  Result Date: 05/28/2022 INDICATION: Congestive heart failure with fluid overload and right pleural effusion. Request for therapeutic thoracentesis. EXAM: ULTRASOUND GUIDED RIGHT THORACENTESIS MEDICATIONS: 1% lidocaine 10 mL COMPLICATIONS: None immediate. PROCEDURE: An ultrasound guided thoracentesis was thoroughly discussed with the patient and questions answered. The benefits, risks, alternatives and complications were also discussed. The patient understands and wishes to proceed with the procedure. Written consent was obtained. Ultrasound was performed to localize and mark an adequate pocket of fluid in the right chest. The area was then prepped and draped in the normal sterile fashion. 1% Lidocaine was used for local anesthesia. Under ultrasound guidance a 6 Fr Safe-T-Centesis catheter was introduced. Thoracentesis was performed. The catheter was removed and a dressing applied. FINDINGS: A total of approximately 900 mL of clear yellow fluid was removed. IMPRESSION: Successful ultrasound guided right thoracentesis yielding 900 mL of pleural fluid. No pneumothorax on post-procedure chest x-ray. Procedure performed Read by: Gareth Eagle, PA-C Electronically Signed   By: Aletta Edouard M.D.   On: 05/28/2022 14:13       Lorelei Pont M.D. [facility] 05/29/2022, 4:56 PM  Available via Epic secure chat 7am-7pm After 7 pm,  please refer to night coverage provider listed on amion.

## 2022-05-29 NOTE — Plan of Care (Signed)
  Problem: Clinical Measurements: Goal: Ability to maintain clinical measurements within normal limits will improve Outcome: Progressing Goal: Diagnostic test results will improve Outcome: Progressing Goal: Respiratory complications will improve Outcome: Progressing Goal: Cardiovascular complication will be avoided Outcome: Progressing   Problem: Health Behavior/Discharge Planning: Goal: Ability to manage health-related needs will improve Outcome: Progressing

## 2022-05-29 NOTE — Progress Notes (Signed)
Patient remained stable during shift. No signs of distress nor discomfort or fainting. Pt is now on room air.  Plan of care continues as ordered as patient awaits discharge for rehab.

## 2022-05-29 NOTE — TOC Progression Note (Addendum)
Transition of Care [facility]) - Progression Note    Patient Details  Name: Tracey Bryant MRN: [id number] Date of Birth: 1951/12/05  Transition of [facility]) CM/SW [place], LCSW Phone Number: 05/29/2022, 12:34 PM  Clinical Narrative:     CSW spoke with Ebony Hail from [facility] and sh notified CSW that pt's Josem Kaufmann will end today.CSW has followed up with MD to ascertain if she is medically ready.  CSW was alerted that pt is not medically ready for DC and may Monday. Ebony Hail let CSW know that auth came back quickly, new auth would need to be started. Please follow up with facility closer to DC.   TOC team will continue to assist with discharge planning needs.   Expected Discharge Plan: Skilled Nursing Facility Barriers to Discharge: Insurance Authorization  Expected Discharge Plan and Services Expected Discharge Plan: [place]   Discharge Planning Services: CM Consult   Living arrangements for the past 2 months: Single Family Home                           HH Arranged: Refused [facility]           Social Determinants of Health (SDOH) Interventions    Readmission Risk Interventions    12/27/2021    2:32 PM 03/19/2021    9:34 AM 03/17/2021   11:56 AM  Readmission Risk Prevention Plan  Transportation Screening Complete  Complete  PCP or Specialist Appt within 5-7 Days  Complete   PCP or Specialist Appt within 3-5 Days Complete    Home Care Screening  Complete   Medication Review (RN CM)   Complete  HRI or Home Care Consult Complete    Social Work Consult for [place] Planning/Counseling Complete    Palliative Care Screening Not Applicable    Medication Review Press photographer) Complete

## 2022-05-29 NOTE — Progress Notes (Signed)
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 Underlying CKD likely related to HTN and DM. Also, w/ Ecoli UTI--receiving rocephin -Cr stable today. Remains nonoliguric -c/w lasix '120mg'$  TID, will leave her on this for now -Still trying to avoid dialysis and optimize medical management/diuresis for her. She is agreeable with doing dialysis if/when indicated. 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 Would hold off on biopsy for now. Management at this time would not change if we were to do a biopsy. Can certainly consider this as an outpatient if needed, see below -Avoid nephrotoxic medications including NSAIDs and iodinated intravenous contrast exposure unless the latter is absolutely indicated.  Preferred narcotic agents for pain control are hydromorphone, fentanyl, and methadone. Morphine should not be used. Avoid Baclofen and avoid oral sodium phosphate and magnesium citrate based laxatives / bowel preps. Continue strict Input and Output monitoring. Will monitor the patient closely with you and intervene or adjust therapy as indicated by changes in clinical status/labs   Acute on chronic sCHF exacerbation -cardiology following -diuresis as above -may need another CXR +/- thora? Will defer to primary service about this  Dizziness/syncope -secondary to subacute basal ganglia infarct as seen on MRI +/- BPPV? -mgmt per primary  Ecoli UTI -on rocephin  HTN -BP w/ reasonable control  Proteinuria -suspecting this is more so related to DM -has seen rheum in the past-ruled out lupus. HIV neg in 07/2021 -will check secondary/serologic work up while she's here (hep bsag neg, hep sab neg, hep c ab neg, FLC ratio WNL, SPEP pending)  Pleural effusion secondary to CHF -s/p rt thora 5/24, repeat thora 6/2 900cc  drained  Choleycystitis -per primary service  Anemia -hgb at goal for CKD  DM2 w/ hyperglycemia -DM mgmt per primary service  Protein calorie malnutrition, hypoalbuminemia -push protein  Subjective:   Patient seen and examined this AM.  Patient reports that her breathing is significantly better today, status post thoracentesis-900 cc drained from her right lung.  Able to lay flat, not requiring any oxygen.  Dizziness is still a problem She is now net neg ~6.2 since admit   Objective:   BP 120/60 (BP Location: Left Arm)   Pulse 62   Temp 97.8 F (36.6 C) (Oral)   Resp 15   Ht '5\' 6"'$  (1.676 m)   Wt 89.3 kg   SpO2 95%   BMI 31.78 kg/m   Intake/Output Summary (Last 24 hours) at 05/29/2022 2633 Last data filed at 05/29/2022 3545 Gross per 24 hour  Intake 1241.55 ml  Output 1500 ml  Net -258.45 ml   Weight change:   Physical Exam: Gen: NAD, laying flat in bed CVS:s1s2, rrr Resp:decreased breath sounds bibasilar, no overt w/r/r/c, speaking in full sentences, unlabored GYB:WLSL Ext: 1-2+ pitting edema b/l Les (slightly improved as compared to yesterday) Neuro: awake, alert, no asterixis  Imaging: DG Chest 1 View  Result Date: 05/28/2022 CLINICAL DATA:  Follow-up chest radiograph. Status post thoracentesis. EXAM: CHEST  1 VIEW COMPARISON:  05/26/2022. FINDINGS: Decreased opacity at the right lung base following thoracentesis. Persistent opacity at the left lung base consistent with a small effusion and atelectasis. Persistent bilateral interstitial thickening. No lung consolidation. No pneumothorax. IMPRESSION: 1. Decreased right pleural effusion following thoracentesis. No pneumothorax. Electronically Signed   By: Lajean Manes M.D.   On: 05/28/2022 11:48  IR THORACENTESIS ASP PLEURAL SPACE W/IMG GUIDE  Result Date: 05/28/2022 INDICATION: Congestive heart failure with fluid overload and right pleural effusion. Request for therapeutic thoracentesis. EXAM: ULTRASOUND GUIDED  RIGHT THORACENTESIS MEDICATIONS: 1% lidocaine 10 mL COMPLICATIONS: None immediate. PROCEDURE: An ultrasound guided thoracentesis was thoroughly discussed with the patient and questions answered. The benefits, risks, alternatives and complications were also discussed. The patient understands and wishes to proceed with the procedure. Written consent was obtained. Ultrasound was performed to localize and mark an adequate pocket of fluid in the right chest. The area was then prepped and draped in the normal sterile fashion. 1% Lidocaine was used for local anesthesia. Under ultrasound guidance a 6 Fr Safe-T-Centesis catheter was introduced. Thoracentesis was performed. The catheter was removed and a dressing applied. FINDINGS: A total of approximately 900 mL of clear yellow fluid was removed. IMPRESSION: Successful ultrasound guided right thoracentesis yielding 900 mL of pleural fluid. No pneumothorax on post-procedure chest x-ray. Procedure performed Read by: Gareth Eagle, PA-C Electronically Signed   By: Aletta Edouard M.D.   On: 05/28/2022 14:13    Labs: BMET Recent Labs  Lab 05/23/22 0329 05/24/22 0207 05/25/22 0411 05/26/22 0313 05/27/22 0402 05/28/22 0426 05/29/22 0356  NA 136 135 132* 136 134* 135 138  K 4.8 4.6 4.7 4.8 5.1 4.4 3.7  CL 105 101 101 101 101 99 98  CO2 '24 25 22 27 26 27 29  '$ GLUCOSE 202* 230* 151* 141* 195* 138* 111*  BUN 30* 34* 36* 40* 43* 47* 51*  CREATININE 3.00* 3.19* 3.01* 3.10* 3.24* 3.08* 3.07*  CALCIUM 8.2* 8.2* 8.2* 8.2* 8.1* 8.1* 8.3*  PHOS  --  4.4 4.7* 5.9* 6.3* 6.1* 5.4*   CBC Recent Labs  Lab 05/24/22 0207 05/25/22 0411 05/26/22 0313 05/27/22 0402  WBC 6.7 7.3 5.9 6.7  NEUTROABS 4.5 4.8 3.8 4.4  HGB 10.8* 10.3* 10.1* 10.2*  HCT 33.3* 32.7* 31.5* 32.4*  MCV 90.0 90.1 90.0 89.8  PLT 327 334 287 348    Medications:     apixaban  5 mg Oral BID   carvedilol  6.25 mg Oral BID WC   docusate sodium  100 mg Oral BID   feeding supplement (GLUCERNA SHAKE)   237 mL Oral TID BM   hydrALAZINE  50 mg Oral Q8H   hydrocortisone cream   Topical BID   insulin aspart  0-15 Units Subcutaneous TID WC   insulin aspart  0-5 Units Subcutaneous QHS   insulin aspart  3 Units Subcutaneous TID WC   insulin glargine-yfgn  15 Units Subcutaneous Daily   isosorbide mononitrate  15 mg Oral Daily   multivitamin with minerals  1 tablet Oral Daily   sodium chloride flush  3 mL Intravenous Q12H      Gean Quint, MD [facility] 05/29/2022, 9:24 AM

## 2022-05-30 DIAGNOSIS — I5043 Acute on chronic combined systolic (congestive) and diastolic (congestive) heart failure: Secondary | ICD-10-CM | POA: Diagnosis not present

## 2022-05-30 LAB — RENAL FUNCTION PANEL
Albumin: 2.3 g/dL — ABNORMAL LOW (ref 3.5–5.0)
Anion gap: 12 (ref 5–15)
BUN: 53 mg/dL — ABNORMAL HIGH (ref 8–23)
CO2: 27 mmol/L (ref 22–32)
Calcium: 8.1 mg/dL — ABNORMAL LOW (ref 8.9–10.3)
Chloride: 98 mmol/L (ref 98–111)
Creatinine, Ser: 2.94 mg/dL — ABNORMAL HIGH (ref 0.44–1.00)
GFR, Estimated: 17 mL/min — ABNORMAL LOW (ref >60)
Glucose, Bld: 153 mg/dL — ABNORMAL HIGH (ref 70–99)
Phosphorus: 5 mg/dL — ABNORMAL HIGH (ref 2.5–4.6)
Potassium: 3.6 mmol/L (ref 3.5–5.1)
Sodium: 137 mmol/L (ref 135–145)

## 2022-05-30 LAB — GLUCOSE, CAPILLARY
Glucose-Capillary: 153 mg/dL — ABNORMAL HIGH (ref 70–99)
Glucose-Capillary: 212 mg/dL — ABNORMAL HIGH (ref 70–99)
Glucose-Capillary: 182 mg/dL — ABNORMAL HIGH (ref 70–99)
Glucose-Capillary: 208 mg/dL — ABNORMAL HIGH (ref 70–99)

## 2022-05-30 LAB — IRON AND TIBC
Iron: 30 ug/dL (ref 28–170)
Saturation Ratios: 8 % — ABNORMAL LOW (ref 10.4–31.8)
TIBC: 364 ug/dL (ref 250–450)
UIBC: 334 ug/dL

## 2022-05-30 LAB — MAGNESIUM: Magnesium: 2.7 mg/dL — ABNORMAL HIGH (ref 1.7–2.4)

## 2022-05-30 MED ORDER — METOLAZONE 2.5 MG PO TABS
2.5000 mg | ORAL_TABLET | Freq: Once | ORAL | Status: AC
Start: 2022-05-30 — End: 2022-05-30
  Administered 2022-05-30: 2.5 mg via ORAL
  Filled 2022-05-30: qty 1

## 2022-05-30 NOTE — Progress Notes (Signed)
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 Underlying CKD likely related to HTN and DM. Also, w/ Ecoli UTI--receiving rocephin -Cr stable today. Remains nonoliguric (would expect a robust urine output with high dose diuretics) -c/w lasix '120mg'$  TID, will leave her on this for now, will give a dose of metolazone 2.'5mg'$  today. Anticipating that we can transition her to PO soon -Still trying to avoid dialysis and optimize medical management/diuresis for her. She is agreeable with doing dialysis if/when indicated. 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 Would hold off on biopsy for now. Management at this time would not change if we were to do a biopsy. Can certainly consider this as an outpatient if needed, see below -Avoid nephrotoxic medications including NSAIDs and iodinated intravenous contrast exposure unless the latter is absolutely indicated.  Preferred narcotic agents for pain control are hydromorphone, fentanyl, and methadone. Morphine should not be used. Avoid Baclofen and avoid oral sodium phosphate and magnesium citrate based laxatives / bowel preps. Continue strict Input and Output monitoring. Will monitor the patient closely with you and intervene or adjust therapy as indicated by changes in clinical status/labs   Acute on chronic sCHF exacerbation -cardiology following -diuresis as above  Dizziness/syncope -etiology unclear, possibly secondary to subacute basal ganglia infarct as seen on MRI +/- BPPV? -mgmt per primary  Ecoli UTI -on rocephin  HTN -BP w/ reasonable control  Proteinuria -suspecting this is more so related to DM -has seen rheum in the past-ruled out lupus. 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  Patient reports that her breathing remains stable, much better since thora. Has been able to lay flat for the last couple of days without incidence of SOB. Dizziness remains an issue.uop ~ 1.8L Attempted to call [name] (son) with updates per patient request--no answer.   Objective:   BP (!) 148/57   Pulse 60   Temp 97.6 F (36.4 C) (Oral)   Resp 20   Ht '5\' 6"'$  (1.676 m)   Wt 86.5 kg   SpO2 98%   BMI 30.78 kg/m   Intake/Output Summary (Last 24 hours) at 05/30/2022 0957 Last data filed at 05/30/2022 0403 Gross per 24 hour  Intake 600 ml  Output 2475 ml  Net -1875 ml   Weight change: -2.5 kg  Physical Exam: Gen: NAD, laying flat in bed CVS:s1s2, rrr Resp:decreased breath sounds bibasilar, no overt w/r/r/c, speaking in full sentences, unlabored HMC:NOBS Ext: 1-2+ pitting edema b/l Les (unchanged) Neuro: awake, alert, no asterixis  Imaging: DG Chest 1 View  Result Date: 05/28/2022 CLINICAL DATA:  Follow-up chest radiograph. Status post thoracentesis. EXAM: CHEST  1 VIEW COMPARISON:  05/26/2022. FINDINGS: Decreased opacity at the right lung base following thoracentesis. Persistent opacity at the left lung base consistent  with a small effusion and atelectasis. Persistent bilateral interstitial thickening. No lung consolidation. No pneumothorax. IMPRESSION: 1. Decreased right pleural effusion following thoracentesis. No pneumothorax. Electronically  Signed   By: Lajean Manes M.D.   On: 05/28/2022 11:48   IR THORACENTESIS ASP PLEURAL SPACE W/IMG GUIDE  Result Date: 05/28/2022 INDICATION: Congestive heart failure with fluid overload and right pleural effusion. Request for therapeutic thoracentesis. EXAM: ULTRASOUND GUIDED RIGHT THORACENTESIS MEDICATIONS: 1% lidocaine 10 mL COMPLICATIONS: None immediate. PROCEDURE: An ultrasound guided thoracentesis was thoroughly discussed with the patient and questions answered. The benefits, risks, alternatives and complications were also discussed. The patient understands and wishes to proceed with the procedure. Written consent was obtained. Ultrasound was performed to localize and mark an adequate pocket of fluid in the right chest. The area was then prepped and draped in the normal sterile fashion. 1% Lidocaine was used for local anesthesia. Under ultrasound guidance a 6 Fr Safe-T-Centesis catheter was introduced. Thoracentesis was performed. The catheter was removed and a dressing applied. FINDINGS: A total of approximately 900 mL of clear yellow fluid was removed. IMPRESSION: Successful ultrasound guided right thoracentesis yielding 900 mL of pleural fluid. No pneumothorax on post-procedure chest x-ray. Procedure performed Read by: Gareth Eagle, PA-C Electronically Signed   By: Aletta Edouard M.D.   On: 05/28/2022 14:13    Labs: BMET Recent Labs  Lab 05/24/22 0207 05/25/22 0411 05/26/22 8250 05/27/22 0402 05/28/22 0426 05/29/22 0356 05/30/22 0453  NA 135 132* 136 134* 135 138 137  K 4.6 4.7 4.8 5.1 4.4 3.7 3.6  CL 101 101 101 101 99 98 98  CO2 '25 22 27 26 27 29 27  '$ GLUCOSE 230* 151* 141* 195* 138* 111* 153*  BUN 34* 36* 40* 43* 47* 51* 53*  CREATININE 3.19* 3.01* 3.10* 3.24* 3.08* 3.07* 2.94*  CALCIUM 8.2* 8.2* 8.2* 8.1* 8.1* 8.3* 8.1*  PHOS 4.4 4.7* 5.9* 6.3* 6.1* 5.4* 5.0*   CBC Recent Labs  Lab 05/24/22 0207 05/25/22 0411 05/26/22 0313 05/27/22 0402  WBC 6.7 7.3 5.9 6.7  NEUTROABS 4.5  4.8 3.8 4.4  HGB 10.8* 10.3* 10.1* 10.2*  HCT 33.3* 32.7* 31.5* 32.4*  MCV 90.0 90.1 90.0 89.8  PLT 327 334 287 348    Medications:     apixaban  5 mg Oral BID   carvedilol  6.25 mg Oral BID WC   cephALEXin  500 mg Oral Q12H   docusate sodium  100 mg Oral BID   feeding supplement (GLUCERNA SHAKE)  237 mL Oral TID BM   hydrALAZINE  50 mg Oral Q8H   hydrocortisone cream   Topical BID   insulin aspart  0-15 Units Subcutaneous TID WC   insulin aspart  0-5 Units Subcutaneous QHS   insulin aspart  3 Units Subcutaneous TID WC   insulin glargine-yfgn  15 Units Subcutaneous Daily   isosorbide mononitrate  15 mg Oral Daily   multivitamin with minerals  1 tablet Oral Daily   sodium chloride flush  3 mL Intravenous Q12H      Gean Quint, MD [facility] 05/30/2022, 9:57 AM

## 2022-05-30 NOTE — Progress Notes (Signed)
PROGRESS NOTE    Tracey Bryant  BTD:[id number] DOB: 1951-05-01 DOA: 05/19/2022 PCP: Coolidge Breeze, FNP   Brief Narrative:  Tracey Bryant is a 71 y.o. female with PMH cholecystitis with cholecystostomy tube in place (initially placed 03/19/2021), afib, HTN, DMII, sCHF, CVA who presented with shortness of breath.  She also had reported increased lower extremity swelling/overload with no improvement on her home Lasix.   She underwent thoracentesis on 05/19/2022 removing 1.3 L.  Initially Lasix was held after developing some dizziness.Respiratory status improved after thoracentesis.     She also was evaluated by IR due to difficulty with cholecystostomy tube drainage.  Tube was evaluated and noted intact however suture had been removed but tube had not been retracted any.  After forceful flushing, biliary output was achieved with no further issues.   Due to ongoing dizziness, MRI brain was obtained on 5/30 positive for subacute infarct.  Neurology consulted.   6/2: repeat thoracentesis with IR, 900 cc drained, symptomatic improvement     Assessment & Plan:   Principal Problem:   Acute on chronic combined systolic and diastolic congestive heart failure (HCC) Active Problems:   Acute renal failure superimposed on stage 4 chronic kidney disease (HCC)   Dizziness   Cholecystitis, chronic   Acute respiratory failure with hypoxia (HCC)   Physical deconditioning   Hypertension   Uncontrolled type 2 diabetes mellitus with hyperglycemia, with long-term current use of insulin (HCC)   Paroxysmal atrial fibrillation (HCC)   History of CVA (cerebrovascular accident)  Acute on chronic combined systolic and diastolic congestive heart failure (HCC)/Acute respiratory failure with hypoxia: Underwent thoracentesis on 5/24 with removal of 1.3 L with symptomatic improvement. Initially Lasix held due to worsening renal function, dizziness however continued to have volume overload with persisting lower  extremity edema.  Lasix resumed.  She underwent repeat thoracentesis on 6/2 and removal of 900 cc fluid with notable symptomatic improvement again.  EF on most recent echo notably 50-55%, grade II diastolic heart failure. Suspected pt gained fluid over a significant amount of time.  -Cardiology, nephrology following, appreciate recommendations, negative balance of 8.7 L.  Remains on high dose of IV Lasix, managed by nephrology. -coreg 6.25 mg BID -hydral 50 q8h, imdur 15 qD -holding sglt2i   Moderate to large right-sided pleural effusion Initial thoracentesis on 5/24. CXR on 5/31 showed moderate right and small left pleural effusion, ordered right-sided ultrasound guided thoracentesis, completed 6/2.    E. coli UTI Urine culture showed more than 100,000 colonies of E. Coli, resistant to ampicillin.  -IV Rocephin 6/1-6/2, transition to PO keflex 500 mg BID    Acute kidney injury superimposed on stage 3b-4 CKD ([place]) Underlying CKD likely related to hypertension, DM, came in with slightly worsened creatinine in context of acute on chronic systolic CHF exacerbation, diuresis, ATN.  Currently creatinine at his baseline.  Appreciate nephrology help.  Dizziness/syncope, subacute CVA prior history of CVA MRI brain showed probable small subacute infarct of the inferior left basal ganglia.  Chronic infarcts and chronic microvascular ischemic changes.  Patient declined MRA. -Carotid Dopplers unremarkable -2D echo showed EF 50 to 55%, grade 2 DD, no shunt. -LDL 52, hemoglobin A1c 9.0. -Seen by neurology.  Continue eliquis per their recommendations.   Cholecystitis, chronic -Diagnosed with acute cholecystitis in 02/2021, underwent cholecystostomy tube placement on 03/19/2021. Tube output had decreased admission, improved with flushing with radiology -Per IR, perc cholecystectomy tube will need to be exchanged before discharge.   Uncontrolled type 2  diabetes mellitus with hyperglycemia, with long-term  current use of insulin (HCC) -Hemoglobin A1c 9.0.  Currently on Semglee 15 units, NovoLog 3 units 3 times daily premeals and SSI, blood sugar fairly controlled.   Paroxysmal atrial fibrillation (HCC) -Rate controlled, continue Coreg, -Continue eliquis   chronic anemia, is a Jehovah's witness, would not want transfusion; no active bleeding noted    Hypertension -Very well controlled, continue Coreg, Imdur, hydralazine   Rash on the back - ?  Contact dermatitis, placed on hydrocortisone ointment   Obesity Estimated body mass index is 31.78 kg/m as calculated from the following:   Height as of this encounter: '5\' 6"'$  (1.676 m).   Weight as of this encounter: 89.3 kg. Weight loss counseled.  DVT prophylaxis:   Eliquis   Code Status: Full Code  Family Communication:  None present at bedside.  Plan of care discussed with patient in length and he/she verbalized understanding and agreed with it.  Status is: Inpatient Remains inpatient appropriate because: Needs further diuresis per nephrology.   Estimated body mass index is 30.78 kg/m as calculated from the following:   Height as of this encounter: '5\' 6"'$  (1.676 m).   Weight as of this encounter: 86.5 kg.    Nutritional Assessment: Body mass index is 30.78 kg/m.Marland Kitchen Seen by dietician.  I agree with the assessment and plan as outlined below: Nutrition Status: Nutrition Problem: Increased nutrient needs Etiology: chronic illness (CHF) Signs/Symptoms: estimated needs Interventions: MVI, Glucerna shake  . Skin Assessment: I have examined the patient's skin and I agree with the wound assessment as performed by the wound care RN as outlined below:    Consultants:  Nephrology Neurology-signed off Cardiology  Procedures:  None  Antimicrobials:  Anti-infectives (From admission, onward)    Start     Dose/Rate Route Frequency Ordered Stop   05/29/22 1345  cephALEXin (KEFLEX) capsule 500 mg        500 mg Oral Every 12 hours  05/29/22 1258 06/01/22 0959   05/27/22 1400  cefTRIAXone (ROCEPHIN) 1 g in sodium chloride 0.9 % 100 mL IVPB  Status:  Discontinued        1 g 200 mL/hr over 30 Minutes Intravenous Every 24 hours 05/27/22 1256 05/29/22 1258         Subjective: Patient seen and examined.  She is fully alert and oriented and has no complaints.  Objective: Vitals:   05/29/22 2117 05/30/22 0357 05/30/22 0624 05/30/22 1114  BP: (!) 132/59 (!) 132/53 (!) 148/57 134/61  Pulse: 61 60  64  Resp: '16 20  10  '$ Temp: 97.6 F (36.4 C) 97.6 F (36.4 C)  98.7 F (37.1 C)  TempSrc: Oral Oral  Oral  SpO2:    93%  Weight:  86.5 kg    Height:        Intake/Output Summary (Last 24 hours) at 05/30/2022 1356 Last data filed at 05/30/2022 1300 Gross per 24 hour  Intake 1080 ml  Output 3126 ml  Net -2046 ml   Filed Weights   05/28/22 0714 05/29/22 0407 05/30/22 0357  Weight: 89 kg 89.3 kg 86.5 kg    Examination:  General exam: Appears calm and comfortable  Respiratory system: Slightly diminished breath sounds at the right base.Marland Kitchen Respiratory effort normal. Cardiovascular system: S1 & S2 heard, RRR. No JVD, murmurs, rubs, gallops or clicks. No pedal edema. Gastrointestinal system: Abdomen is nondistended, soft and nontender. No organomegaly or masses felt. Normal bowel sounds heard. Central nervous system: Alert and oriented.  No focal neurological deficits. Extremities: Symmetric 5 x 5 power. Skin: No rashes, lesions or ulcers Psychiatry: Judgement and insight appear normal. Mood & affect appropriate.    Data Reviewed: I have personally reviewed following labs and imaging studies  CBC: Recent Labs  Lab 05/24/22 0207 05/25/22 0411 05/26/22 0313 05/27/22 0402  WBC 6.7 7.3 5.9 6.7  NEUTROABS 4.5 4.8 3.8 4.4  HGB 10.8* 10.3* 10.1* 10.2*  HCT 33.3* 32.7* 31.5* 32.4*  MCV 90.0 90.1 90.0 89.8  PLT 327 334 287 539   Basic Metabolic Panel: Recent Labs  Lab 05/24/22 0207 05/25/22 0411 05/26/22 0313  05/27/22 0402 05/28/22 0426 05/29/22 0356 05/30/22 0453  NA 135 132* 136 134* 135 138 137  K 4.6 4.7 4.8 5.1 4.4 3.7 3.6  CL 101 101 101 101 99 98 98  CO2 '25 22 27 26 27 29 27  '$ GLUCOSE 230* 151* 141* 195* 138* 111* 153*  BUN 34* 36* 40* 43* 47* 51* 53*  CREATININE 3.19* 3.01* 3.10* 3.24* 3.08* 3.07* 2.94*  CALCIUM 8.2* 8.2* 8.2* 8.1* 8.1* 8.3* 8.1*  MG 2.7* 2.6* 2.8* 2.9*  --   --  2.7*  PHOS 4.4 4.7* 5.9* 6.3* 6.1* 5.4* 5.0*   GFR: Estimated Creatinine Clearance: 19.7 mL/min (A) (by C-G formula based on SCr of 2.94 mg/dL (H)). Liver Function Tests: Recent Labs  Lab 05/26/22 0313 05/27/22 0402 05/28/22 0426 05/29/22 0356 05/30/22 0453  ALBUMIN 2.1* 2.2* 2.2* 2.2* 2.3*   No results for input(s): LIPASE, AMYLASE in the last 168 hours. No results for input(s): AMMONIA in the last 168 hours. Coagulation Profile: No results for input(s): INR, PROTIME in the last 168 hours. Cardiac Enzymes: No results for input(s): CKTOTAL, CKMB, CKMBINDEX, TROPONINI in the last 168 hours. BNP (last 3 results) Recent Labs    05/04/22 1625  PROBNP 15,044*   HbA1C: No results for input(s): HGBA1C in the last 72 hours. CBG: Recent Labs  Lab 05/29/22 1139 05/29/22 1633 05/29/22 2143 05/30/22 0559 05/30/22 1121  GLUCAP 178* 198* 162* 153* 212*   Lipid Profile: No results for input(s): CHOL, HDL, LDLCALC, TRIG, CHOLHDL, LDLDIRECT in the last 72 hours. Thyroid Function Tests: No results for input(s): TSH, T4TOTAL, FREET4, T3FREE, THYROIDAB in the last 72 hours. Anemia Panel: Recent Labs    05/30/22 0453  TIBC 364  IRON 30   Sepsis Labs: No results for input(s): PROCALCITON, LATICACIDVEN in the last 168 hours.  Recent Results (from the past 240 hour(s))  Urine Culture     Status: Abnormal   Collection Time: 05/26/22  6:12 AM   Specimen: Urine, Clean Catch  Result Value Ref Range Status   Specimen Description URINE, CLEAN CATCH  Final   Special Requests   Final     NONE Performed at [facility], [address], [place], [place] [address]    Culture >=100,000 COLONIES/mL ESCHERICHIA COLI (A)  Final   Report Status 05/28/2022 FINAL  Final   Organism ID, Bacteria ESCHERICHIA COLI (A)  Final      Susceptibility   Escherichia coli - MIC*    AMPICILLIN >=32 RESISTANT Resistant     CEFAZOLIN <=4 SENSITIVE Sensitive     CEFEPIME <=0.12 SENSITIVE Sensitive     CEFTRIAXONE <=0.25 SENSITIVE Sensitive     CIPROFLOXACIN <=0.25 SENSITIVE Sensitive     GENTAMICIN <=1 SENSITIVE Sensitive     IMIPENEM <=0.25 SENSITIVE Sensitive     NITROFURANTOIN <=16 SENSITIVE Sensitive     TRIMETH/SULFA <=20 SENSITIVE Sensitive  AMPICILLIN/SULBACTAM 4 SENSITIVE Sensitive     PIP/TAZO <=4 SENSITIVE Sensitive     * >=100,000 COLONIES/mL ESCHERICHIA COLI     Radiology Studies: No results found.  Scheduled Meds:  apixaban  5 mg Oral BID   carvedilol  6.25 mg Oral BID WC   cephALEXin  500 mg Oral Q12H   docusate sodium  100 mg Oral BID   feeding supplement (GLUCERNA SHAKE)  237 mL Oral TID BM   hydrALAZINE  50 mg Oral Q8H   hydrocortisone cream   Topical BID   insulin aspart  0-15 Units Subcutaneous TID WC   insulin aspart  0-5 Units Subcutaneous QHS   insulin aspart  3 Units Subcutaneous TID WC   insulin glargine-yfgn  15 Units Subcutaneous Daily   isosorbide mononitrate  15 mg Oral Daily   multivitamin with minerals  1 tablet Oral Daily   sodium chloride flush  3 mL Intravenous Q12H   Continuous Infusions:  sodium chloride 5 mL/hr at 05/29/22 2126   furosemide 120 mg (05/30/22 1352)     LOS: 10 days   Darliss Cheney, MD [facility]  05/30/2022, 1:56 PM   *Please note that this is a verbal dictation therefore any spelling or grammatical errors are due to the "[place] One" system interpretation.  Please page via [place] and do not message via secure chat for urgent patient care matters. Secure chat can be used for non urgent patient care  matters.  How to contact the [facility] Attending or Consulting provider [place] or covering provider during after hours [place], for this patient?  Check the care team in [facility] and look for a) attending/consulting [facility] provider listed and b) the [facility] team listed. Page or secure chat [phone]. Log into [email or web address] and use 's universal password to access. If you do not have the password, please contact the hospital operator. Locate the [facility] provider you are looking for under [facility] and page to a number that you can be directly reached. If you still have difficulty reaching the provider, please page the [facility] (Director on Call) for the Hospitalists listed on amion for assistance.

## 2022-05-31 ENCOUNTER — Inpatient Hospital Stay (HOSPITAL_COMMUNITY): Payer: Medicare HMO

## 2022-05-31 DIAGNOSIS — N170 Acute kidney failure with tubular necrosis: Secondary | ICD-10-CM | POA: Diagnosis not present

## 2022-05-31 DIAGNOSIS — N184 Chronic kidney disease, stage 4 (severe): Secondary | ICD-10-CM | POA: Diagnosis not present

## 2022-05-31 DIAGNOSIS — I480 Paroxysmal atrial fibrillation: Secondary | ICD-10-CM | POA: Diagnosis not present

## 2022-05-31 DIAGNOSIS — I5043 Acute on chronic combined systolic (congestive) and diastolic (congestive) heart failure: Secondary | ICD-10-CM | POA: Diagnosis not present

## 2022-05-31 HISTORY — PX: IR EXCHANGE BILIARY DRAIN: IMG6046

## 2022-05-31 LAB — GLUCOSE, CAPILLARY
Glucose-Capillary: 111 mg/dL — ABNORMAL HIGH (ref 70–99)
Glucose-Capillary: 168 mg/dL — ABNORMAL HIGH (ref 70–99)
Glucose-Capillary: 111 mg/dL — ABNORMAL HIGH (ref 70–99)
Glucose-Capillary: 140 mg/dL — ABNORMAL HIGH (ref 70–99)

## 2022-05-31 LAB — RENAL FUNCTION PANEL
Albumin: 2.3 g/dL — ABNORMAL LOW (ref 3.5–5.0)
Anion gap: 8 (ref 5–15)
BUN: 48 mg/dL — ABNORMAL HIGH (ref 8–23)
CO2: 31 mmol/L (ref 22–32)
Calcium: 8.2 mg/dL — ABNORMAL LOW (ref 8.9–10.3)
Chloride: 96 mmol/L — ABNORMAL LOW (ref 98–111)
Creatinine, Ser: 2.76 mg/dL — ABNORMAL HIGH (ref 0.44–1.00)
GFR, Estimated: 18 mL/min — ABNORMAL LOW (ref >60)
Glucose, Bld: 153 mg/dL — ABNORMAL HIGH (ref 70–99)
Phosphorus: 5 mg/dL — ABNORMAL HIGH (ref 2.5–4.6)
Potassium: 3.2 mmol/L — ABNORMAL LOW (ref 3.5–5.1)
Sodium: 135 mmol/L (ref 135–145)

## 2022-05-31 LAB — PROTEIN ELECTROPHORESIS, SERUM
A/G Ratio: 0.7 (ref 0.7–1.7)
Albumin ELP: 2.4 g/dL — ABNORMAL LOW (ref 2.9–4.4)
Alpha-1-Globulin: 0.4 g/dL (ref 0.0–0.4)
Alpha-2-Globulin: 0.8 g/dL (ref 0.4–1.0)
Beta Globulin: 1.2 g/dL (ref 0.7–1.3)
Gamma Globulin: 1.1 g/dL (ref 0.4–1.8)
Globulin, Total: 3.5 g/dL (ref 2.2–3.9)
Total Protein ELP: 5.9 g/dL — ABNORMAL LOW (ref 6.0–8.5)

## 2022-05-31 IMAGING — XA IR EXCHANGE BILARY DRAIN
4 series · 4 of 4 positions shown · non-contrast
Comparison: none

INDICATION: Cholelithiasis, status post percutaneous cholecystostomy catheter
placement [DATE] Dr.

[Series 1: fl angio · 1 of 1 slices shown]
[im 1/1]
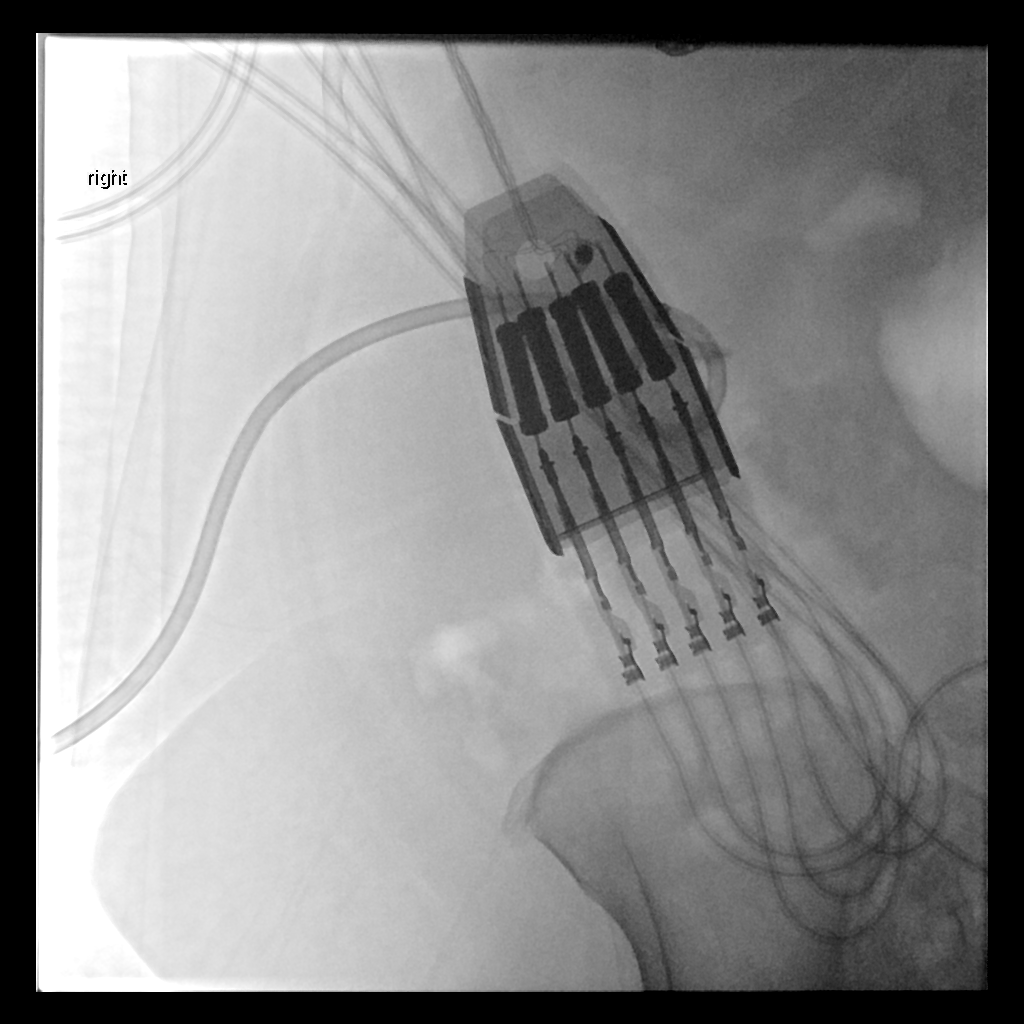

[Series 2: single · 1 of 1 slices shown (1 of 3)]
[im 1/1]
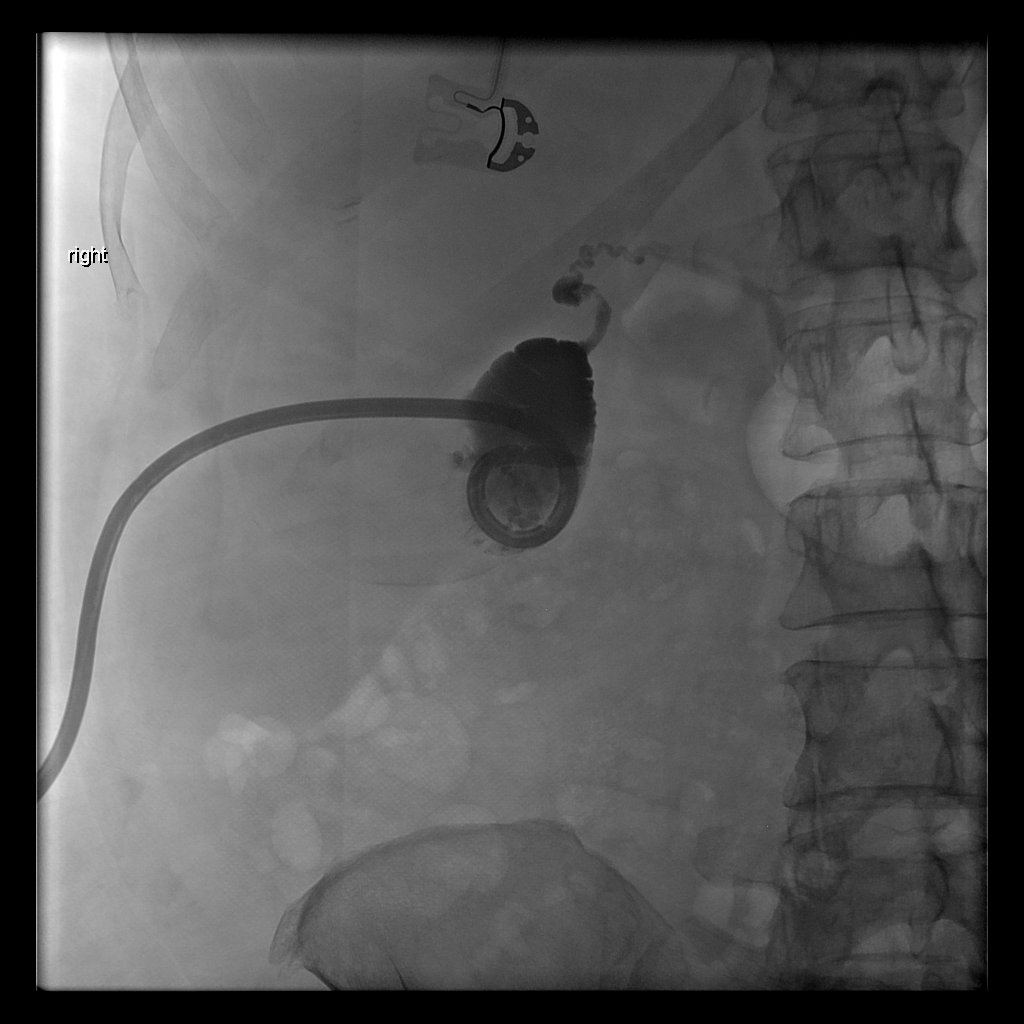

[Series 3: single · 1 of 1 slices shown (2 of 3)]
[im 1/1]
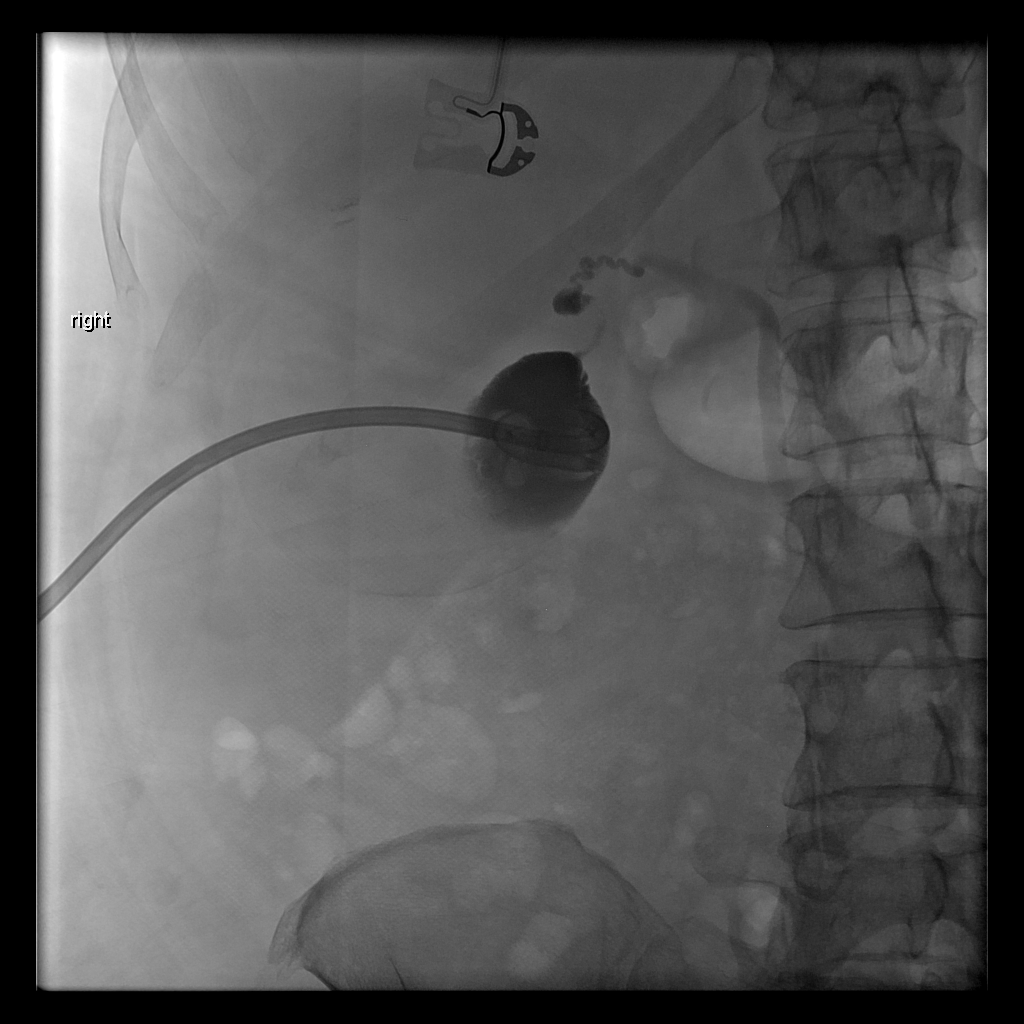

[Series 5: single · 1 of 1 slices shown (3 of 3)]
[im 1/1]
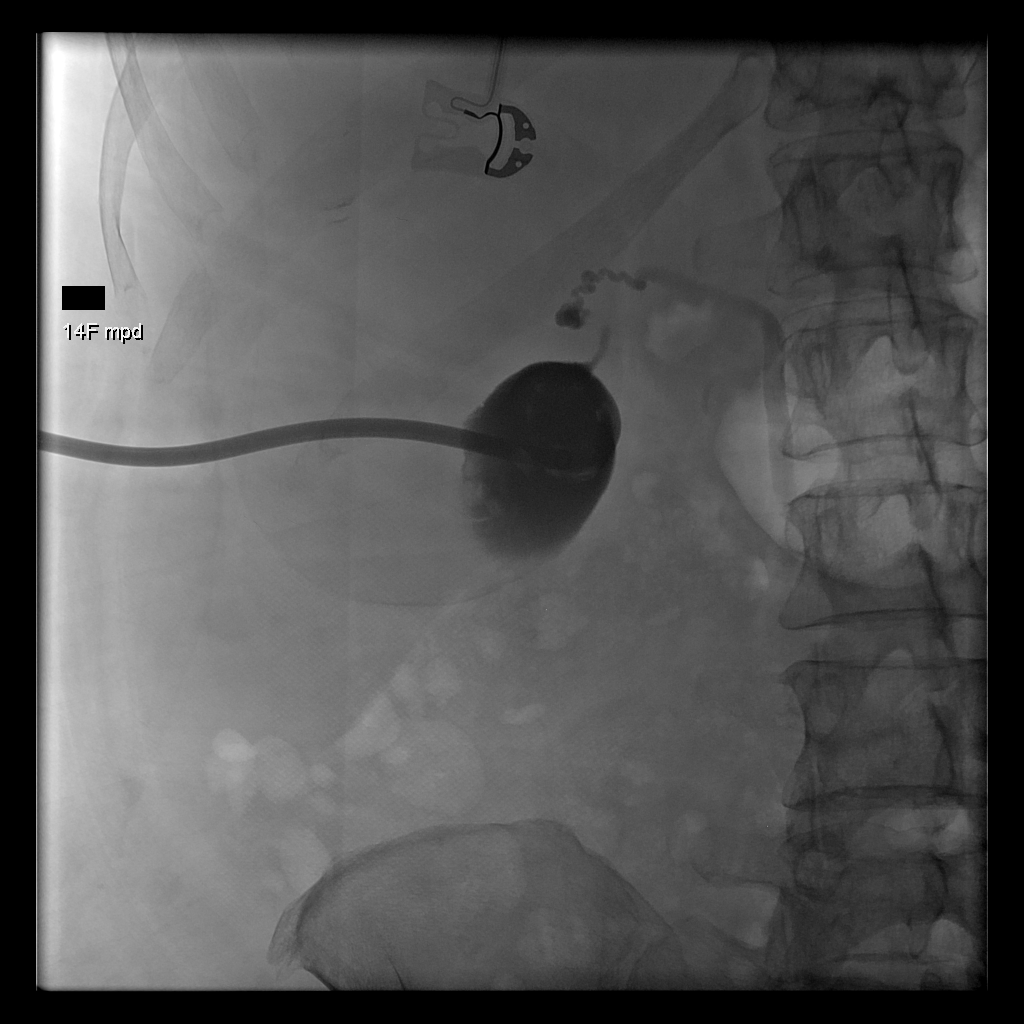

[4 of 4 positions shown; findings below may reference images not displayed]

EXAM:
CHOLECYSTOSTOMY CATHETER EXCHANGE UNDER FLUOROSCOPY

MEDICATIONS:
No antibiotics were indicated

ANESTHESIA/SEDATION:
None required

FLUOROSCOPY:
Radiation Exposure Index (as provided by the fluoroscopic device):
19 mGy Kerma

COMPLICATIONS:
None immediate.

PROCEDURE:
Informed written consent was obtained from the patient after a
thorough discussion of the procedural risks, benefits and
alternatives. All questions were addressed. Maximal Sterile Barrier
Technique was utilized including caps, mask, sterile gowns, sterile
gloves, sterile drape, hand hygiene and skin antiseptic. A timeout
was performed prior to the initiation of the procedure.

Catheter injection demonstrated small mobile filling defects in the
lumen of the nondistended gallbladder. Cystic duct is patent.

Catheter was cut and exchanged over Amplatz wire for a new 14 French
pigtail device, formed centrally within the gallbladder lumen.
Contrast injection confirms good positioning. No extravasation.
IMPRESSION: 1. Technically successful 14 French cholecystostomy catheter
exchange under fluoroscopy.
2. Patent cystic duct is demonstrated.
3. Mobile filling defects in the gallbladder suggesting gallstones
or sludge balls.

## 2022-05-31 MED ORDER — LIDOCAINE HCL (PF) 1 % IJ SOLN
INTRAMUSCULAR | Status: DC | PRN
  Administered 2022-05-31: 10 mL

## 2022-05-31 MED ORDER — SODIUM CHLORIDE 0.9% FLUSH
5.0000 mL | Freq: Three times a day (TID) | INTRAVENOUS | Status: DC
  Administered 2022-05-31 – 2022-06-09 (×22): 5 mL

## 2022-05-31 MED ORDER — POTASSIUM CHLORIDE CRYS ER 20 MEQ PO TBCR
40.0000 meq | EXTENDED_RELEASE_TABLET | ORAL | Status: AC
  Administered 2022-05-31 (×2): 40 meq via ORAL
  Filled 2022-05-31 (×2): qty 2

## 2022-05-31 MED ORDER — IOHEXOL 300 MG/ML  SOLN
100.0000 mL | Freq: Once | INTRAMUSCULAR | Status: AC | PRN
Start: 2022-05-31 — End: 2022-05-31
  Administered 2022-05-31: 10 mL

## 2022-05-31 MED ORDER — LIDOCAINE HCL 1 % IJ SOLN
INTRAMUSCULAR | Status: AC
  Filled 2022-05-31: qty 20

## 2022-05-31 NOTE — Procedures (Signed)
  Procedure:  Cholecystostomy catheter exchange 74fPreprocedure diagnosis: The primary encounter diagnosis was Acute congestive heart failure, unspecified heart failure type ([place]. A diagnosis of Edema of both lower extremities was also pertinent to this visit. Cholelithiasis.  Postprocedure diagnosis: same EBL:    minimal Complications:   none immediate  See full dictation in [facility]'s  DDillard CannonMD Main # [phone]  [phone] [phone]

## 2022-05-31 NOTE — Progress Notes (Signed)
PROGRESS NOTE    Tracey Bryant  JJO:[id number] DOB: 08/11/1951 DOA: 05/19/2022 PCP: Coolidge Breeze, FNP   Brief Narrative:  Tracey Bryant is a 71 y.o. female with PMH cholecystitis with cholecystostomy tube in place (initially placed 03/19/2021), afib, HTN, DMII, sCHF, CVA who presented with shortness of breath.  She also had reported increased lower extremity swelling/overload with no improvement on her home Lasix.   She underwent thoracentesis on 05/19/2022 removing 1.3 L.  Initially Lasix was held after developing some dizziness.Respiratory status improved after thoracentesis.     She also was evaluated by IR due to difficulty with cholecystostomy tube drainage.  Tube was evaluated and noted intact however suture had been removed but tube had not been retracted any.  After forceful flushing, biliary output was achieved with no further issues.   Due to ongoing dizziness, MRI brain was obtained on 5/30 positive for subacute infarct.  Neurology consulted.   6/2: repeat thoracentesis with IR, 900 cc drained, symptomatic improvement     Assessment & Plan:   Principal Problem:   Acute on chronic combined systolic and diastolic congestive heart failure (HCC) Active Problems:   Acute renal failure superimposed on stage 4 chronic kidney disease (HCC)   Dizziness   Cholecystitis, chronic   Acute respiratory failure with hypoxia (HCC)   Physical deconditioning   Hypertension   Uncontrolled type 2 diabetes mellitus with hyperglycemia, with long-term current use of insulin (HCC)   Paroxysmal atrial fibrillation (HCC)   History of CVA (cerebrovascular accident)  Acute on chronic combined systolic and diastolic congestive heart failure (HCC)/Acute respiratory failure with hypoxia: Underwent thoracentesis on 5/24 with removal of 1.3 L with symptomatic improvement. Initially Lasix held due to worsening renal function, dizziness however continued to have volume overload with persisting lower  extremity edema.  Lasix resumed.  She underwent repeat thoracentesis on 6/2 and removal of 900 cc fluid with notable symptomatic improvement again.  EF on most recent echo notably 50-55%, grade II diastolic heart failure. Suspected pt gained fluid over a significant amount of time.  -Cardiology, nephrology following, appreciate recommendations, negative balance of 8.7 L.  Remains on high dose of IV Lasix and had has been receiving metolazone intermittently as well, managed by nephrology. -coreg 6.25 mg BID -hydral 50 q8h, imdur 15 qD -holding sglt2i   Moderate to large right-sided pleural effusion Initial thoracentesis on 5/24. CXR on 5/31 showed moderate right and small left pleural effusion, ordered right-sided ultrasound guided thoracentesis, completed 6/2.    E. coli UTI Urine culture showed more than 100,000 colonies of E. Coli, resistant to ampicillin.  -IV Rocephin 6/1-6/2, transitioned to PO keflex 500 mg BID, last dose on 06/01/2022.   Acute kidney injury superimposed on stage 3b-4 CKD (HCC) Baseline creatinine around 2.0.  Underlying CKD likely related to hypertension, DM, came in with slightly worsened creatinine in context of acute on chronic systolic CHF exacerbation, diuresis, ATN, creatinine peaked at 3.24 on 05/27/2022, improving now, down to 2.76 today.  Nephrology managing diuretics.  Appreciate their help.  Dizziness/syncope, subacute CVA prior history of CVA MRI brain showed probable small subacute infarct of the inferior left basal ganglia.  Chronic infarcts and chronic microvascular ischemic changes.  Patient declined MRA. -Carotid Dopplers unremarkable -2D echo showed EF 50 to 55%, grade 2 DD, no shunt. -LDL 52, hemoglobin A1c 9.0. -Seen by neurology.  Continue eliquis per their recommendations.   Cholecystitis, chronic -Diagnosed with acute cholecystitis in 02/2021, underwent cholecystostomy tube placement on 03/19/2021.  Tube output had decreased admission, improved with  flushing with radiology.  Cholecystostomy tube was replaced by IR on 05/31/2022.   Uncontrolled type 2 diabetes mellitus with hyperglycemia, with long-term current use of insulin (HCC) -Hemoglobin A1c 9.0.  Currently on Semglee 15 units, NovoLog 3 units 3 times daily premeals and SSI, blood sugar fairly controlled.   Paroxysmal atrial fibrillation (HCC) -Rate controlled, continue Coreg, -Continue eliquis   chronic anemia, is a Jehovah's witness, would not want transfusion; no active bleeding noted    Hypertension -Very well controlled, continue Coreg, Imdur, hydralazine   Rash on the back - ?  Contact dermatitis, placed on hydrocortisone ointment   Obesity Estimated body mass index is 31.78 kg/m as calculated from the following:   Height as of this encounter: '5\' 6"'$  (1.676 m).   Weight as of this encounter: 89.3 kg. Weight loss counseled.  DVT prophylaxis:   Eliquis   Code Status: Full Code  Family Communication:  None present at bedside.  Plan of care discussed with patient in length and he/she verbalized understanding and agreed with it.  Status is: Inpatient Remains inpatient appropriate because: Needs further diuresis per nephrology.  Eventual plan to discharge to SNF once cleared by nephrology.   Estimated body mass index is 30.42 kg/m as calculated from the following:   Height as of this encounter: '5\' 6"'$  (1.676 m).   Weight as of this encounter: 85.5 kg.    Nutritional Assessment: Body mass index is 30.42 kg/m.Marland Kitchen Seen by dietician.  I agree with the assessment and plan as outlined below: Nutrition Status: Nutrition Problem: Increased nutrient needs Etiology: chronic illness (CHF) Signs/Symptoms: estimated needs Interventions: MVI, Glucerna shake  . Skin Assessment: I have examined the patient's skin and I agree with the wound assessment as performed by the wound care RN as outlined below:    Consultants:  Nephrology Neurology-signed  off Cardiology  Procedures:  None  Antimicrobials:  Anti-infectives (From admission, onward)    Start     Dose/Rate Route Frequency Ordered Stop   05/29/22 1345  cephALEXin (KEFLEX) capsule 500 mg        500 mg Oral Every 12 hours 05/29/22 1258 06/01/22 0959   05/27/22 1400  cefTRIAXone (ROCEPHIN) 1 g in sodium chloride 0.9 % 100 mL IVPB  Status:  Discontinued        1 g 200 mL/hr over 30 Minutes Intravenous Every 24 hours 05/27/22 1256 05/29/22 1258         Subjective:  Seen and examined.  Complains of generalized weakness.  No other complaint.  Objective: Vitals:   05/30/22 2102 05/31/22 0352 05/31/22 0848 05/31/22 1221  BP: (!) 133/58 133/61 (!) 128/56 132/65  Pulse: 69 62 67 64  Resp: '10 14 18 14  '$ Temp: 98.2 F (36.8 C) 98.2 F (36.8 C) 98.2 F (36.8 C)   TempSrc: Oral Oral Oral   SpO2: 92% 95% 96% 92%  Weight:  85.5 kg    Height:        Intake/Output Summary (Last 24 hours) at 05/31/2022 1321 Last data filed at 05/31/2022 0839 Gross per 24 hour  Intake 1333.18 ml  Output 2050 ml  Net -716.82 ml    Filed Weights   05/29/22 0407 05/30/22 0357 05/31/22 0352  Weight: 89.3 kg 86.5 kg 85.5 kg    Examination:  General exam: Appears calm and comfortable  Respiratory system: Clear to auscultation. Respiratory effort normal. Cardiovascular system: S1 & S2 heard, RRR.  murmurs, rubs, gallops or  clicks.  +1 pitting edema bilateral lower extremity. Gastrointestinal system: Abdomen is nondistended, soft and nontender. No organomegaly or masses felt. Normal bowel sounds heard. Central nervous system: Alert and oriented. No focal neurological deficits. Extremities: Symmetric 5 x 5 power. Skin: No rashes, lesions or ulcers.  Psychiatry: Judgement and insight appear normal. Mood & affect appropriate.    Data Reviewed: I have personally reviewed following labs and imaging studies  CBC: Recent Labs  Lab 05/25/22 0411 05/26/22 0313 05/27/22 0402  WBC 7.3 5.9 6.7   NEUTROABS 4.8 3.8 4.4  HGB 10.3* 10.1* 10.2*  HCT 32.7* 31.5* 32.4*  MCV 90.1 90.0 89.8  PLT 334 287 782    Basic Metabolic Panel: Recent Labs  Lab 05/25/22 0411 05/26/22 0313 05/27/22 0402 05/28/22 0426 05/29/22 0356 05/30/22 0453 05/31/22 0630  NA 132* 136 134* 135 138 137 135  K 4.7 4.8 5.1 4.4 3.7 3.6 3.2*  CL 101 101 101 99 98 98 96*  CO2 '22 27 26 27 29 27 31  '$ GLUCOSE 151* 141* 195* 138* 111* 153* 153*  BUN 36* 40* 43* 47* 51* 53* 48*  CREATININE 3.01* 3.10* 3.24* 3.08* 3.07* 2.94* 2.76*  CALCIUM 8.2* 8.2* 8.1* 8.1* 8.3* 8.1* 8.2*  MG 2.6* 2.8* 2.9*  --   --  2.7*  --   PHOS 4.7* 5.9* 6.3* 6.1* 5.4* 5.0* 5.0*    GFR: Estimated Creatinine Clearance: 20.9 mL/min (A) (by C-G formula based on SCr of 2.76 mg/dL (H)). Liver Function Tests: Recent Labs  Lab 05/27/22 0402 05/28/22 0426 05/29/22 0356 05/30/22 0453 05/31/22 0630  ALBUMIN 2.2* 2.2* 2.2* 2.3* 2.3*    No results for input(s): LIPASE, AMYLASE in the last 168 hours. No results for input(s): AMMONIA in the last 168 hours. Coagulation Profile: No results for input(s): INR, PROTIME in the last 168 hours. Cardiac Enzymes: No results for input(s): CKTOTAL, CKMB, CKMBINDEX, TROPONINI in the last 168 hours. BNP (last 3 results) Recent Labs    05/04/22 1625  PROBNP 15,044*    HbA1C: No results for input(s): HGBA1C in the last 72 hours. CBG: Recent Labs  Lab 05/30/22 1121 05/30/22 1626 05/30/22 2132 05/31/22 0611 05/31/22 1204  GLUCAP 212* 182* 208* 111* 168*    Lipid Profile: No results for input(s): CHOL, HDL, LDLCALC, TRIG, CHOLHDL, LDLDIRECT in the last 72 hours. Thyroid Function Tests: No results for input(s): TSH, T4TOTAL, FREET4, T3FREE, THYROIDAB in the last 72 hours. Anemia Panel: Recent Labs    05/30/22 0453  TIBC 364  IRON 30    Sepsis Labs: No results for input(s): PROCALCITON, LATICACIDVEN in the last 168 hours.  Recent Results (from the past 240 hour(s))  Urine Culture      Status: Abnormal   Collection Time: 05/26/22  6:12 AM   Specimen: Urine, Clean Catch  Result Value Ref Range Status   Specimen Description URINE, CLEAN CATCH  Final   Special Requests   Final    NONE Performed at [facility], [address], [place], [place] [address]    Culture >=100,000 COLONIES/mL ESCHERICHIA COLI (A)  Final   Report Status 05/28/2022 FINAL  Final   Organism ID, Bacteria ESCHERICHIA COLI (A)  Final      Susceptibility   Escherichia coli - MIC*    AMPICILLIN >=32 RESISTANT Resistant     CEFAZOLIN <=4 SENSITIVE Sensitive     CEFEPIME <=0.12 SENSITIVE Sensitive     CEFTRIAXONE <=0.25 SENSITIVE Sensitive     CIPROFLOXACIN <=0.25 SENSITIVE Sensitive  GENTAMICIN <=1 SENSITIVE Sensitive     IMIPENEM <=0.25 SENSITIVE Sensitive     NITROFURANTOIN <=16 SENSITIVE Sensitive     TRIMETH/SULFA <=20 SENSITIVE Sensitive     AMPICILLIN/SULBACTAM 4 SENSITIVE Sensitive     PIP/TAZO <=4 SENSITIVE Sensitive     * >=100,000 COLONIES/mL ESCHERICHIA COLI      Radiology Studies: No results found.  Scheduled Meds:  apixaban  5 mg Oral BID   carvedilol  6.25 mg Oral BID WC   cephALEXin  500 mg Oral Q12H   docusate sodium  100 mg Oral BID   feeding supplement (GLUCERNA SHAKE)  237 mL Oral TID BM   hydrALAZINE  50 mg Oral Q8H   hydrocortisone cream   Topical BID   insulin aspart  0-15 Units Subcutaneous TID WC   insulin aspart  0-5 Units Subcutaneous QHS   insulin aspart  3 Units Subcutaneous TID WC   insulin glargine-yfgn  15 Units Subcutaneous Daily   isosorbide mononitrate  15 mg Oral Daily   lidocaine       multivitamin with minerals  1 tablet Oral Daily   potassium chloride  40 mEq Oral Q4H   sodium chloride flush  3 mL Intravenous Q12H   sodium chloride flush  5 mL Intracatheter Q8H   Continuous Infusions:  sodium chloride 5 mL/hr at 05/29/22 2126   furosemide 120 mg (05/31/22 0552)     LOS: 11 days   Darliss Cheney, MD [facility]  05/31/2022, 1:21 PM   *Please note that this is a verbal dictation therefore any spelling or grammatical errors are due to the "[place] [employer]" system interpretation.  Please page via [place] and do not message via secure chat for urgent patient care matters. Secure chat can be used for non urgent patient care matters.  How to contact the [facility] Attending or Consulting provider [place] or covering provider during after hours [place], for this patient?  Check the care team in [facility] and look for a) attending/consulting [facility] provider listed and b) the [facility] team listed. Page or secure chat [phone]. Log into [email or web address] and use [facility] universal password to access. If you do not have the password, please contact the hospital operator. Locate the [facility] provider you are looking for under [facility] and page to a number that you can be directly reached. If you still have difficulty reaching the provider, please page the [facility] (Director on Call) for the Hospitalists listed on amion for assistance.

## 2022-05-31 NOTE — Progress Notes (Signed)
Patient ID: Tracey Bryant, female   DOB: 1951-11-30, 71 y.o.   MRN: [id number] S: Feels weak and tired.  Unable to work with PT due to dizziness. O:BP (!) 128/56 (BP Location: Left Arm)   Pulse 67   Temp 98.2 F (36.8 C) (Oral)   Resp 18   Ht '5\' 6"'$  (1.676 m)   Wt 85.5 kg   SpO2 96%   BMI 30.42 kg/m   Intake/Output Summary (Last 24 hours) at 05/31/2022 1100 Last data filed at 05/31/2022 0839 Gross per 24 hour  Intake 1573.18 ml  Output 2050 ml  Net -476.82 ml   Intake/Output: I/O last 3 completed shifts: In: 2353.2 [P.O.:1920; I.V.:159.9; IV Piggyback:273.3] Out: 3776 [Urine:2400; Drains:1375; Stool:1]  Intake/Output this shift:  Total I/O In: 60 [P.O.:60] Out: 300 [Urine:300] Weight change: -1 kg Gen:NAD CVS: RRR Resp: CTA Abd: +BS, soft, NT/ND Ext: 1+ pretibial edema  Recent Labs  Lab 05/25/22 0411 05/26/22 0313 05/27/22 0402 05/28/22 0426 05/29/22 0356 05/30/22 0453 05/31/22 0630  NA 132* 136 134* 135 138 137 135  K 4.7 4.8 5.1 4.4 3.7 3.6 3.2*  CL 101 101 101 99 98 98 96*  CO2 '22 27 26 27 29 27 31  '$ GLUCOSE 151* 141* 195* 138* 111* 153* 153*  BUN 36* 40* 43* 47* 51* 53* 48*  CREATININE 3.01* 3.10* 3.24* 3.08* 3.07* 2.94* 2.76*  ALBUMIN 2.1* 2.1* 2.2* 2.2* 2.2* 2.3* 2.3*  CALCIUM 8.2* 8.2* 8.1* 8.1* 8.3* 8.1* 8.2*  PHOS 4.7* 5.9* 6.3* 6.1* 5.4* 5.0* 5.0*   Liver Function Tests: Recent Labs  Lab 05/29/22 0356 05/30/22 0453 05/31/22 0630  ALBUMIN 2.2* 2.3* 2.3*   No results for input(s): LIPASE, AMYLASE in the last 168 hours. No results for input(s): AMMONIA in the last 168 hours. CBC: Recent Labs  Lab 05/25/22 0411 05/26/22 0313 05/27/22 0402  WBC 7.3 5.9 6.7  NEUTROABS 4.8 3.8 4.4  HGB 10.3* 10.1* 10.2*  HCT 32.7* 31.5* 32.4*  MCV 90.1 90.0 89.8  PLT 334 287 348   Cardiac Enzymes: No results for input(s): CKTOTAL, CKMB, CKMBINDEX, TROPONINI in the last 168 hours. CBG: Recent Labs  Lab 05/30/22 0559 05/30/22 1121 05/30/22 1626  05/30/22 2132 05/31/22 0611  GLUCAP 153* 212* 182* 208* 111*    Iron Studies:  Recent Labs    05/30/22 0453  IRON 30  TIBC 364   Studies/Results: No results found.  apixaban  5 mg Oral BID   carvedilol  6.25 mg Oral BID WC   cephALEXin  500 mg Oral Q12H   docusate sodium  100 mg Oral BID   feeding supplement (GLUCERNA SHAKE)  237 mL Oral TID BM   hydrALAZINE  50 mg Oral Q8H   hydrocortisone cream   Topical BID   insulin aspart  0-15 Units Subcutaneous TID WC   insulin aspart  0-5 Units Subcutaneous QHS   insulin aspart  3 Units Subcutaneous TID WC   insulin glargine-yfgn  15 Units Subcutaneous Daily   isosorbide mononitrate  15 mg Oral Daily   multivitamin with minerals  1 tablet Oral Daily   potassium chloride  40 mEq Oral Q4H   sodium chloride flush  3 mL Intravenous Q12H    BMET    Component Value Date/Time   NA 135 05/31/2022 0630   NA 141 05/04/2022 1625   K 3.2 (L) 05/31/2022 0630   CL 96 (L) 05/31/2022 0630   CO2 31 05/31/2022 0630   GLUCOSE 153 (H) 05/31/2022 0630  BUN 48 (H) 05/31/2022 0630   BUN 18 05/04/2022 1625   CREATININE 2.76 (H) 05/31/2022 0630   CALCIUM 8.2 (L) 05/31/2022 0630   GFRNONAA 18 (L) 05/31/2022 0630   GFRAA >90 01/17/2013 1915   CBC    Component Value Date/Time   WBC 6.7 05/27/2022 0402   RBC 3.61 (L) 05/27/2022 0402   HGB 10.2 (L) 05/27/2022 0402   HCT 32.4 (L) 05/27/2022 0402   PLT 348 05/27/2022 0402   MCV 89.8 05/27/2022 0402   MCH 28.3 05/27/2022 0402   MCHC 31.5 05/27/2022 0402   RDW 15.5 05/27/2022 0402   LYMPHSABS 1.2 05/27/2022 0402   MONOABS 0.6 05/27/2022 0402   EOSABS 0.4 05/27/2022 0402   BASOSABS 0.0 05/27/2022 0402     Assessment/Plan:  AKI/CKD stage IV - likely hemodynamically mediated in setting of acute systolic CHF exacerbation.  Scr has been slowly improving back to her baseline and is now 2.76.  Good UOP with IV lasix and metolazone yesterday.  No indication for dialysis at this time.  Pt is a  marginal candidate given her poor functional status. Avoid nephrotoxic medications including NSAIDs and iodinated intravenous contrast exposure unless the latter is absolutely indicated.   Preferred narcotic agents for pain control are hydromorphone, fentanyl, and methadone. Morphine should not be used. Avoid Baclofen and avoid oral sodium phosphate and magnesium citrate based laxatives / bowel preps.  Continue strict Input and Output monitoring. Will monitor the patient closely with you and intervene or adjust therapy as indicated by changes in clinical status/labs Acute on chronic systolic CHF - diuresed well overnight.  Continue with current regimen.   Recurrent right pleural effusion - s/p thoracentesis on 5/24 and again 05/28/22 with improved breathing. Subacute stroke - presented with dizziness and seen by MRI on 05/26/22 to left inferior basal ganglia.  Neurology consulted. Chronic cholecystitis - per IR.  Donetta Potts, MD [facility]

## 2022-05-31 NOTE — Plan of Care (Signed)
°  Problem: Education: °Goal: Ability to demonstrate management of disease process will improve °Outcome: Progressing °  °Problem: Education: °Goal: Ability to verbalize understanding of medication therapies will improve °Outcome: Progressing °  °Problem: Education: °Goal: Individualized Educational Video(s) °Outcome: Progressing °  °Problem: Activity: °Goal: Capacity to carry out activities will improve °Outcome: Progressing °  °

## 2022-05-31 NOTE — Progress Notes (Signed)
Physical Therapy Treatment Patient Details Name: Tracey Bryant MRN: [id number] DOB: 05/29/1951 Today's Date: 05/31/2022   History of Present Illness The pt is a 71 yo female presenting from home 5/24 with SOB and BLE swelling. Found to have acute CHF exacerbation and R pleural effusion s/p thoracentesis 5/24.  Underwent right-sided ultrasound guided thoracentesis on 6/2 and had R cholecystosomy catheter exchange performed in IR on 6/5.  PMH includes: arthritis, CHF, DM II, DVT, HTN, afib, and stroke.    PT Comments    Patient progressing to OOB this session and though PT brought stedy in for assisted transfer, pt eager to try "the regular way" without device.  Able to pivot to [facility] then stand and step with assist to recliner.  Still having episodes of "passing out" or "going limp" but recovers within seconds with guided focus and vitals remain stable.  She will continue to benefit from skilled PT in the acute setting and she remains appropriate for follow up STSNF level rehab at d/c.    Recommendations for follow up therapy are one component of a multi-disciplinary discharge planning process, led by the attending physician.  Recommendations may be updated based on patient status, additional functional criteria and insurance authorization.  Follow Up Recommendations  Skilled nursing-short term rehab (<3 hours/day)     Assistance Recommended at Discharge Frequent or constant Supervision/Assistance  Patient can return home with the following Two people to help with walking and/or transfers;Two people to help with bathing/dressing/bathroom;Assistance with cooking/housework;Direct supervision/assist for medications management;Direct supervision/assist for financial management;Assist for transportation;Help with stairs or ramp for entrance   Equipment Recommendations  None recommended by PT    Recommendations for Other Services       Precautions / Restrictions Precautions Precautions:  Fall Precaution Comments: psuedo-syncopal episodes with supine to sit (hold onto her so she doesn't fall over)     Mobility  Bed Mobility Overal bed mobility: Needs Assistance Bed Mobility: Supine to Sit, Sit to Supine   Sidelying to sit: HOB elevated, Min assist       General bed mobility comments: assist for balance pt using rail and moving hips initially on her own till feeling weak and "going limp" momentarily then assisted to scoot hips the rest of the way    Transfers Overall transfer level: Needs assistance   Transfers: Bed to chair/wheelchair/BSC, Sit to/from Stand Sit to Stand: Mod assist Stand pivot transfers: Mod assist   Squat pivot transfers: Mod assist     General transfer comment: squat pivot to [facility] with cues for hand placement and some lifting help; then stood to transfer to recliner with steps and mod A without device to allow for provider proximity in case pt with episode of going limp.    Ambulation/Gait                   Stairs             Wheelchair Mobility    Modified Rankin (Stroke Patients Only) Modified Rankin (Stroke Patients Only) Pre-Morbid Rankin Score: No significant disability Modified Rankin: Severe disability     Balance Overall balance assessment: Needs assistance   Sitting balance-Leahy Scale: Poor Sitting balance - Comments: weak core and needs at least close S in case feels like going limp and falling back     Standing balance-Leahy Scale: Poor Standing balance comment: mod A for short period standing to get to recliner  Cognition Arousal/Alertness: Awake/alert Behavior During Therapy: WFL for tasks assessed/performed Overall Cognitive Status: Impaired/Different from baseline Area of Impairment: Memory, Problem solving, Safety/judgement                     Memory: Decreased short-term memory   Safety/Judgement: Decreased awareness of safety, Decreased  awareness of deficits   Problem Solving: Slow processing, Requires verbal cues General Comments: "passing out" episodes somewhat induced by strenuous activity, she will go limp and mumble but can be easily roused and re-oriented within seconds.  Question behavioral or an over-reaction to generalized weakness feeling.        Exercises General Exercises - Lower Extremity Long Arc Quad: Strengthening, Both, 10 reps, Seated Other Exercises Other Exercises: seated on BSC performed scapular squeezes with trunk extension x 5 reps w/ 5 sec hold Other Exercises: seated on recliner anterior/posterior rocking with arms over chest x 5    General Comments        Pertinent Vitals/Pain Pain Assessment Faces Pain Scale: No hurt    Home Living                          Prior Function            PT Goals (current goals can now be found in the care plan section) Progress towards PT goals: Progressing toward goals    Frequency    Min 3X/week      PT Plan Current plan remains appropriate    Co-evaluation              AM-PAC PT "6 Clicks" Mobility   Outcome Measure  Help needed turning from your back to your side while in a flat bed without using bedrails?: A Little Help needed moving from lying on your back to sitting on the side of a flat bed without using bedrails?: A Lot Help needed moving to and from a bed to a chair (including a wheelchair)?: A Lot Help needed standing up from a chair using your arms (e.g., wheelchair or bedside chair)?: A Lot Help needed to walk in hospital room?: Total Help needed climbing 3-5 steps with a railing? : Total 6 Click Score: 11    End of Session Equipment Utilized During Treatment: Gait belt Activity Tolerance: Patient limited by fatigue Patient left: in chair;with call bell/phone within reach;with chair alarm set Nurse Communication: Mobility status;Need for lift equipment PT Visit Diagnosis: Other abnormalities of gait and  mobility (R26.89);Muscle weakness (generalized) (M62.81)     Time: 5056-9794 PT Time Calculation (min) (ACUTE ONLY): 37 min  Charges:  $Therapeutic Exercise: 8-22 mins $Therapeutic Activity: 8-22 mins                     Magda Kiel, PT Acute Rehabilitation Services [phone] [phone] 05/31/2022    Tracey Bryant 05/31/2022, 5:52 PM

## 2022-05-31 NOTE — Progress Notes (Addendum)
Progress Note  Patient Name: Tracey Bryant Date of Encounter: 05/31/2022  [facility] Cardiologist: Minus Breeding, MD   Subjective   Pt seen with [facility] raised, no distress. States she doesn't feel well but breathing much improved after thoracentesis.   Inpatient Medications    Scheduled Meds:  apixaban  5 mg Oral BID   carvedilol  6.25 mg Oral BID WC   cephALEXin  500 mg Oral Q12H   docusate sodium  100 mg Oral BID   feeding supplement (GLUCERNA SHAKE)  237 mL Oral TID BM   hydrALAZINE  50 mg Oral Q8H   hydrocortisone cream   Topical BID   insulin aspart  0-15 Units Subcutaneous TID WC   insulin aspart  0-5 Units Subcutaneous QHS   insulin aspart  3 Units Subcutaneous TID WC   insulin glargine-yfgn  15 Units Subcutaneous Daily   isosorbide mononitrate  15 mg Oral Daily   multivitamin with minerals  1 tablet Oral Daily   potassium chloride  40 mEq Oral Q4H   sodium chloride flush  3 mL Intravenous Q12H   Continuous Infusions:  sodium chloride 5 mL/hr at 05/29/22 2126   furosemide 120 mg (05/31/22 0552)   PRN Meds: sodium chloride, acetaminophen **OR** acetaminophen, bisacodyl, diphenhydrAMINE, hydrALAZINE, HYDROmorphone (DILAUDID) injection, ondansetron **OR** ondansetron (ZOFRAN) IV, oxyCODONE, polyethylene glycol, traZODone   Vital Signs    Vitals:   05/30/22 1114 05/30/22 2102 05/31/22 0352 05/31/22 0848  BP: 134/61 (!) 133/58 133/61 (!) 128/56  Pulse: 64 69 62 67  Resp: '10 10 14 18  '$ Temp: 98.7 F (37.1 C) 98.2 F (36.8 C) 98.2 F (36.8 C) 98.2 F (36.8 C)  TempSrc: Oral Oral Oral Oral  SpO2: 93% 92% 95% 96%  Weight:   85.5 kg   Height:        Intake/Output Summary (Last 24 hours) at 05/31/2022 1002 Last data filed at 05/31/2022 0839 Gross per 24 hour  Intake 1573.18 ml  Output 2051 ml  Net -477.82 ml      05/31/2022    3:52 AM 05/30/2022    3:57 AM 05/29/2022    4:07 AM  Last 3 Weights  Weight (lbs) 188 lb 7.9 oz 190 lb 11.2 oz 196 lb 13.9 oz   Weight (kg) 85.5 kg 86.5 kg 89.3 kg      Telemetry    Paced rhythm - Personally Reviewed  ECG    No new tracings - Personally Reviewed  Physical Exam   GEN: No acute distress.   Neck: + JVD Cardiac: RRR, no murmurs, rubs, or gallops.  Respiratory: right with good breath sounds, some crackles on left GI: Soft, nontender, non-distended  MS: 1-2+ B LE edema Neuro:  Nonfocal  Psych: Normal affect   Labs    High Sensitivity Troponin:  No results for input(s): TROPONINIHS in the last 720 hours.   Chemistry Recent Labs  Lab 05/26/22 0313 05/27/22 0402 05/28/22 0426 05/29/22 0356 05/30/22 0453 05/31/22 0630  NA 136 134*   < > 138 137 135  K 4.8 5.1   < > 3.7 3.6 3.2*  CL 101 101   < > 98 98 96*  CO2 27 26   < > '29 27 31  '$ GLUCOSE 141* 195*   < > 111* 153* 153*  BUN 40* 43*   < > 51* 53* 48*  CREATININE 3.10* 3.24*   < > 3.07* 2.94* 2.76*  CALCIUM 8.2* 8.1*   < > 8.3* 8.1* 8.2*  MG 2.8* 2.9*  --   --  2.7*  --   ALBUMIN 2.1* 2.2*   < > 2.2* 2.3* 2.3*  GFRNONAA 16* 15*   < > 16* 17* 18*  ANIONGAP 8 7   < > '11 12 8   '$ < > = values in this interval not displayed.    Lipids  Recent Labs  Lab 05/26/22 0313  CHOL 99  TRIG 54  HDL 36*  LDLCALC 52  CHOLHDL 2.8    Hematology Recent Labs  Lab 05/25/22 0411 05/26/22 0313 05/27/22 0402  WBC 7.3 5.9 6.7  RBC 3.63* 3.50* 3.61*  HGB 10.3* 10.1* 10.2*  HCT 32.7* 31.5* 32.4*  MCV 90.1 90.0 89.8  MCH 28.4 28.9 28.3  MCHC 31.5 32.1 31.5  RDW 15.7* 15.6* 15.5  PLT 334 287 348   Thyroid No results for input(s): TSH, FREET4 in the last 168 hours.  BNPNo results for input(s): BNP, PROBNP in the last 168 hours.  DDimer No results for input(s): DDIMER in the last 168 hours.   Radiology    No results found.  Cardiac Studies   Echo 05/20/22: 1. Left ventricular ejection fraction, by estimation, is 50 to 55%. The  left ventricle has low normal function. The left ventricle has no regional  wall motion abnormalities.  Left ventricular diastolic parameters are  consistent with Grade II diastolic  dysfunction (pseudonormalization). Elevated left ventricular end-diastolic  pressure. The E/e' is 21.   2. Right ventricular systolic function is normal. The right ventricular  size is mildly enlarged. There is mildly elevated pulmonary artery  systolic pressure. The estimated right ventricular systolic pressure is  37.1 mmHg.   3. Left atrial size was mildly dilated.   4. The mitral valve is abnormal. Trivial mitral valve regurgitation.   5. The aortic valve is tricuspid. Aortic valve regurgitation is not  visualized.   6. The inferior vena cava is normal in size with <50% respiratory  variability, suggesting right atrial pressure of 8 mmHg.   Patient Profile     71 y.o. female with a history of chronic combined CHF with EF previously as low as 25-30% but improved to 40-45% on Echo in 11/2021 and 50-55% on Echo this admission, paroxysmal atrial fibrillation on Eliquis, hypertension, type 2 diabetes mellitus, and CKD stage IV who was admitted on 05/19/2022 with acute on chronic combined CHF.  Assessment & Plan    Acute on chronic combined heart failure Hypertension Acute on chronic CKD stage IV BNP elevated 2083 with CXR showing interval progression of right base collapse/consolidation and right pleural effusion - echo this admission with LVEF 50-55%, previously as low as 25-30% - thoracentesis as below - defer to nephrology for diuresis - currently on 120 mg IV lasix TID with 2.5 mg metolazone yesterday --> 1.7 L urine output yesterday - GDMT: coreg 6.25 mg BID, hydralazine 50 mg TID, imdur 15 mg daily - BP 120-130s - consider increasing imdur to 30 mg   Right pleural effusion - recurrent - s/p thoracentesis 05/19/22 with 1.3 L fluid removed - s/p thoracentesis on 05/28/22 with 900 cc fluid removed - ?related to chole drain - improvement in breathing after thoracentesis   PAF Chronic  anticoagulation S/P dual chamber PPM - medtronic Telemetry with paced rhythm On coreg and eliquis   Dizziness  Subacute stroke - Brain MRI 05/26/22 with probably small subacute infarct in left inferior basal ganglia - Neurology consulted - do not feel this is the etiology of her  dizziness - continue eliquis   UTI - E. Coli - continue ABX per primary   Chronic cholecystitis - chole drain flushed aggressively in IR with better drainage - per IR, will need drain changed prior to discharge   For questions or updates, please contact [place] Please consult [email or web address] for contact info under        Signed, Ledora Bottcher, PA  05/31/2022, 10:02 AM     Patient seen and examined.  Agree with above documentation.  On exam, patient is alert and oriented, regular rate and rhythm, no murmurs, diminished breath sounds, 1+ LE edema, + JVD.  Net negative 1.1L yesterday, negative 9.2L on admission.  Cr improving, 2.76 today. Continue diuresis per nephrology.  Donato Heinz, MD

## 2022-06-01 DIAGNOSIS — N184 Chronic kidney disease, stage 4 (severe): Secondary | ICD-10-CM | POA: Diagnosis not present

## 2022-06-01 DIAGNOSIS — N170 Acute kidney failure with tubular necrosis: Secondary | ICD-10-CM | POA: Diagnosis not present

## 2022-06-01 DIAGNOSIS — I5043 Acute on chronic combined systolic (congestive) and diastolic (congestive) heart failure: Secondary | ICD-10-CM | POA: Diagnosis not present

## 2022-06-01 LAB — RENAL FUNCTION PANEL
Albumin: 2.3 g/dL — ABNORMAL LOW (ref 3.5–5.0)
Anion gap: 8 (ref 5–15)
BUN: 48 mg/dL — ABNORMAL HIGH (ref 8–23)
CO2: 33 mmol/L — ABNORMAL HIGH (ref 22–32)
Calcium: 8.4 mg/dL — ABNORMAL LOW (ref 8.9–10.3)
Chloride: 97 mmol/L — ABNORMAL LOW (ref 98–111)
Creatinine, Ser: 2.94 mg/dL — ABNORMAL HIGH (ref 0.44–1.00)
GFR, Estimated: 17 mL/min — ABNORMAL LOW (ref >60)
Glucose, Bld: 89 mg/dL (ref 70–99)
Phosphorus: 4.4 mg/dL (ref 2.5–4.6)
Potassium: 3.8 mmol/L (ref 3.5–5.1)
Sodium: 138 mmol/L (ref 135–145)

## 2022-06-01 LAB — GLUCOSE, CAPILLARY
Glucose-Capillary: 107 mg/dL — ABNORMAL HIGH (ref 70–99)
Glucose-Capillary: 148 mg/dL — ABNORMAL HIGH (ref 70–99)
Glucose-Capillary: 109 mg/dL — ABNORMAL HIGH (ref 70–99)
Glucose-Capillary: 141 mg/dL — ABNORMAL HIGH (ref 70–99)

## 2022-06-01 MED ORDER — FUROSEMIDE 10 MG/ML IJ SOLN
120.0000 mg | Freq: Two times a day (BID) | INTRAVENOUS | Status: DC
  Administered 2022-06-01 – 2022-06-02 (×2): 120 mg via INTRAVENOUS
  Filled 2022-06-01: qty 10
  Filled 2022-06-01 (×2): qty 12
  Filled 2022-06-01: qty 10

## 2022-06-01 NOTE — Progress Notes (Signed)
PROGRESS NOTE    Tracey Bryant  ONG:[id number] DOB: 03-02-1951 DOA: 05/19/2022 PCP: Coolidge Breeze, FNP   Brief Narrative:  Tracey Bryant is a 71 y.o. female with PMH cholecystitis with cholecystostomy tube in place (initially placed 03/19/2021), afib, HTN, DMII, sCHF, CVA who presented with shortness of breath.  She also had reported increased lower extremity swelling/overload with no improvement on her home Lasix.   She underwent thoracentesis on 05/19/2022 removing 1.3 L.  Initially Lasix was held after developing some dizziness.Respiratory status improved after thoracentesis.     She also was evaluated by IR due to difficulty with cholecystostomy tube drainage.  Tube was evaluated and noted intact however suture had been removed but tube had not been retracted any.  After forceful flushing, biliary output was achieved with no further issues.   Due to ongoing dizziness, MRI brain was obtained on 5/30 positive for subacute infarct.  Neurology consulted.   6/2: repeat thoracentesis with IR, 900 cc drained, symptomatic improvement     Assessment & Plan:   Principal Problem:   Acute on chronic combined systolic and diastolic congestive heart failure (HCC) Active Problems:   Acute renal failure superimposed on stage 4 chronic kidney disease (HCC)   Dizziness   Cholecystitis, chronic   Acute respiratory failure with hypoxia (HCC)   Physical deconditioning   Hypertension   Uncontrolled type 2 diabetes mellitus with hyperglycemia, with long-term current use of insulin (HCC)   Paroxysmal atrial fibrillation (HCC)   History of CVA (cerebrovascular accident)  Acute on chronic combined systolic and diastolic congestive heart failure (HCC)/Acute respiratory failure with hypoxia: Underwent thoracentesis on 5/24 with removal of 1.3 L with symptomatic improvement. Initially Lasix held due to worsening renal function, dizziness however continued to have volume overload with persisting lower  extremity edema.  Lasix resumed.  She underwent repeat thoracentesis on 6/2 and removal of 900 cc fluid with notable symptomatic improvement again.  EF on most recent echo notably 50-55%, grade II diastolic heart failure. Suspected pt gained fluid over a significant amount of time.  -Cardiology, nephrology following, appreciate recommendations, negative balance of 11.3 L.  Remains on high dose of IV Lasix and had has been receiving metolazone intermittently as well, slight bump in creatinine, Lasix reduced from 3 times daily to twice daily, managed by nephrology. -coreg 6.25 mg BID -hydral 50 q8h, imdur 15 qD -holding [id number]   Moderate to large right-sided pleural effusion Initial thoracentesis on 5/24. CXR on 5/31 showed moderate right and small left pleural effusion, ordered right-sided ultrasound guided thoracentesis, completed 6/2.    E. coli UTI Urine culture showed more than 100,000 colonies of E. Coli, resistant to ampicillin.  -IV Rocephin 6/1-6/2, transitioned to PO keflex 500 mg BID, last dose on 06/01/2022.   Acute kidney injury superimposed on stage 3b-4 CKD (HCC) Baseline creatinine around 2.0.  Underlying CKD likely related to hypertension, DM, came in with slightly worsened creatinine in context of acute on chronic systolic CHF exacerbation, diuresis, ATN, creatinine peaked at 3.24 on 05/27/2022, improving now, slight bump in creatinine to 2.94 today, nephrology managing diuretics.  Appreciate their help.  Dizziness/syncope, subacute CVA prior history of CVA MRI brain showed probable small subacute infarct of the inferior left basal ganglia.  Chronic infarcts and chronic microvascular ischemic changes.  Patient declined MRA. -Carotid Dopplers unremarkable -2D echo showed EF 50 to 55%, grade 2 DD, no shunt. -LDL 52, hemoglobin A1c 9.0. -Seen by neurology.  Continue eliquis per their recommendations.  Cholecystitis, chronic -Diagnosed with acute cholecystitis in 02/2021, underwent  cholecystostomy tube placement on 03/19/2021. Tube output had decreased admission, improved with flushing with radiology.  Cholecystostomy tube was replaced by IR on 05/31/2022.   Uncontrolled type 2 diabetes mellitus with hyperglycemia, with long-term current use of insulin (HCC) -Hemoglobin A1c 9.0.  Currently on Semglee 15 units, NovoLog 3 units 3 times daily premeals and SSI, blood sugar fairly controlled.   Paroxysmal atrial fibrillation (HCC) -Rate controlled, continue Coreg, -Continue eliquis   chronic anemia, is a Jehovah's witness, would not want transfusion; no active bleeding noted    Hypertension -Very well controlled, continue Coreg, Imdur, hydralazine   Rash on the back - ?  Contact dermatitis, placed on hydrocortisone ointment   Obesity Estimated body mass index is 31.78 kg/m as calculated from the following:   Height as of this encounter: '5\' 6"'$  (1.676 m).   Weight as of this encounter: 89.3 kg. Weight loss counseled.  DVT prophylaxis:   Eliquis   Code Status: Full Code  Family Communication:  None present at bedside.  Plan of care discussed with patient in length and he/she verbalized understanding and agreed with it.  Status is: Inpatient Remains inpatient appropriate because: Needs further diuresis per nephrology.  Eventual plan to discharge to SNF once cleared by nephrology.   Estimated body mass index is 28.32 kg/m as calculated from the following:   Height as of this encounter: '5\' 6"'$  (1.676 m).   Weight as of this encounter: 79.6 kg.    Nutritional Assessment: Body mass index is 28.32 kg/m.Marland Kitchen Seen by dietician.  I agree with the assessment and plan as outlined below: Nutrition Status: Nutrition Problem: Increased nutrient needs Etiology: chronic illness (CHF) Signs/Symptoms: estimated needs Interventions: MVI, Glucerna shake  . Skin Assessment: I have examined the patient's skin and I agree with the wound assessment as performed by the wound care RN  as outlined below:    Consultants:  Nephrology Neurology-signed off Cardiology  Procedures:  None  Antimicrobials:  Anti-infectives (From admission, onward)    Start     Dose/Rate Route Frequency Ordered Stop   05/29/22 1345  cephALEXin (KEFLEX) capsule 500 mg        500 mg Oral Every 12 hours 05/29/22 1258 06/01/22 0959   05/27/22 1400  cefTRIAXone (ROCEPHIN) 1 g in sodium chloride 0.9 % 100 mL IVPB  Status:  Discontinued        1 g 200 mL/hr over 30 Minutes Intravenous Every 24 hours 05/27/22 1256 05/29/22 1258         Subjective:  Patient seen and examined.  She has no complaints.  Objective: Vitals:   05/31/22 2036 06/01/22 0358 06/01/22 0548 06/01/22 1250  BP: 118/75 (!) 134/58 (!) 125/52 (!) 115/52  Pulse: 64 63 60 62  Resp: '18 15 20   '$ Temp: 98.2 F (36.8 C) 98.1 F (36.7 C) 98.1 F (36.7 C) 98 F (36.7 C)  TempSrc: Oral Oral Oral Oral  SpO2: 98% 94%  98%  Weight:  79.6 kg    Height:        Intake/Output Summary (Last 24 hours) at 06/01/2022 1303 Last data filed at 06/01/2022 1236 Gross per 24 hour  Intake 1621.74 ml  Output 3535 ml  Net -1913.26 ml    Filed Weights   05/30/22 0357 05/31/22 0352 06/01/22 0358  Weight: 86.5 kg 85.5 kg 79.6 kg    Examination:  General exam: Appears calm and comfortable  Respiratory system: Clear to auscultation.  Respiratory effort normal. Cardiovascular system: S1 & S2 heard, RRR. No JVD, murmurs, rubs, gallops or clicks.  +1 pitting edema bilateral lower extremity. Gastrointestinal system: Abdomen is nondistended, soft and nontender. No organomegaly or masses felt. Normal bowel sounds heard. Central nervous system: Alert and oriented. No focal neurological deficits. Extremities: Symmetric 5 x 5 power. Skin: No rashes, lesions or ulcers.  Psychiatry: Judgement and insight appear normal. Mood & affect appropriate.   Data Reviewed: I have personally reviewed following labs and imaging studies  CBC: Recent Labs   Lab 05/26/22 0313 05/27/22 0402  WBC 5.9 6.7  NEUTROABS 3.8 4.4  HGB 10.1* 10.2*  HCT 31.5* 32.4*  MCV 90.0 89.8  PLT 287 330    Basic Metabolic Panel: Recent Labs  Lab 05/26/22 0313 05/27/22 0402 05/28/22 0426 05/29/22 0356 05/30/22 0453 05/31/22 0630 06/01/22 0442  NA 136 134* 135 138 137 135 138  K 4.8 5.1 4.4 3.7 3.6 3.2* 3.8  CL 101 101 99 98 98 96* 97*  CO2 '27 26 27 29 27 31 '$ 33*  GLUCOSE 141* 195* 138* 111* 153* 153* 89  BUN 40* 43* 47* 51* 53* 48* 48*  CREATININE 3.10* 3.24* 3.08* 3.07* 2.94* 2.76* 2.94*  CALCIUM 8.2* 8.1* 8.1* 8.3* 8.1* 8.2* 8.4*  MG 2.8* 2.9*  --   --  2.7*  --   --   PHOS 5.9* 6.3* 6.1* 5.4* 5.0* 5.0* 4.4    GFR: Estimated Creatinine Clearance: 18.9 mL/min (A) (by C-G formula based on SCr of 2.94 mg/dL (H)). Liver Function Tests: Recent Labs  Lab 05/28/22 0426 05/29/22 0356 05/30/22 0453 05/31/22 0630 06/01/22 0442  ALBUMIN 2.2* 2.2* 2.3* 2.3* 2.3*    No results for input(s): LIPASE, AMYLASE in the last 168 hours. No results for input(s): AMMONIA in the last 168 hours. Coagulation Profile: No results for input(s): INR, PROTIME in the last 168 hours. Cardiac Enzymes: No results for input(s): CKTOTAL, CKMB, CKMBINDEX, TROPONINI in the last 168 hours. BNP (last 3 results) Recent Labs    05/04/22 1625  PROBNP 15,044*    HbA1C: No results for input(s): HGBA1C in the last 72 hours. CBG: Recent Labs  Lab 05/31/22 1204 05/31/22 1531 05/31/22 2142 06/01/22 0559 06/01/22 1129  GLUCAP 168* 111* 140* 107* 148*    Lipid Profile: No results for input(s): CHOL, HDL, LDLCALC, TRIG, CHOLHDL, LDLDIRECT in the last 72 hours. Thyroid Function Tests: No results for input(s): TSH, T4TOTAL, FREET4, T3FREE, THYROIDAB in the last 72 hours. Anemia Panel: Recent Labs    05/30/22 0453  TIBC 364  IRON 30    Sepsis Labs: No results for input(s): PROCALCITON, LATICACIDVEN in the last 168 hours.  Recent Results (from the past 240  hour(s))  Urine Culture     Status: Abnormal   Collection Time: 05/26/22  6:12 AM   Specimen: Urine, Clean Catch  Result Value Ref Range Status   Specimen Description URINE, CLEAN CATCH  Final   Special Requests   Final    NONE Performed at [facility], [address], [place], [place] [address]    Culture >=100,000 COLONIES/mL ESCHERICHIA COLI (A)  Final   Report Status 05/28/2022 FINAL  Final   Organism ID, Bacteria ESCHERICHIA COLI (A)  Final      Susceptibility   Escherichia coli - MIC*    AMPICILLIN >=32 RESISTANT Resistant     CEFAZOLIN <=4 SENSITIVE Sensitive     CEFEPIME <=0.12 SENSITIVE Sensitive     CEFTRIAXONE <=0.25 SENSITIVE  Sensitive     CIPROFLOXACIN <=0.25 SENSITIVE Sensitive     GENTAMICIN <=1 SENSITIVE Sensitive     IMIPENEM <=0.25 SENSITIVE Sensitive     NITROFURANTOIN <=16 SENSITIVE Sensitive     TRIMETH/SULFA <=20 SENSITIVE Sensitive     AMPICILLIN/SULBACTAM 4 SENSITIVE Sensitive     PIP/TAZO <=4 SENSITIVE Sensitive     * >=100,000 COLONIES/mL ESCHERICHIA COLI      Radiology Studies: IR EXCHANGE BILIARY DRAIN  Result Date: 05/31/2022 INDICATION: Cholelithiasis, status post percutaneous cholecystostomy catheter placement 03/19/2021 Dr. Jasmine December: CHOLECYSTOSTOMY CATHETER EXCHANGE UNDER FLUOROSCOPY MEDICATIONS: No antibiotics were indicated ANESTHESIA/SEDATION: None required FLUOROSCOPY: Radiation Exposure Index (as provided by the fluoroscopic device): 19 mGy Kerma COMPLICATIONS: None immediate. PROCEDURE: Informed written consent was obtained from the patient after a thorough discussion of the procedural risks, benefits and alternatives. All questions were addressed. Maximal Sterile Barrier Technique was utilized including caps, mask, sterile gowns, sterile gloves, sterile drape, hand hygiene and skin antiseptic. A timeout was performed prior to the initiation of the procedure. Catheter injection demonstrated small mobile filling defects in the lumen of the  nondistended gallbladder. Cystic duct is patent. Catheter was cut and exchanged over Amplatz wire for a new 14 French pigtail device, formed centrally within the gallbladder lumen. Contrast injection confirms good positioning. No extravasation. IMPRESSION: 1. Technically successful 44 French cholecystostomy catheter exchange under fluoroscopy. 2. Patent cystic duct is demonstrated. 3. Mobile filling defects in the gallbladder suggesting gallstones or sludge balls. Electronically Signed   By: Lucrezia Europe M.D.   On: 05/31/2022 15:59    Scheduled Meds:  apixaban  5 mg Oral BID   carvedilol  6.25 mg Oral BID WC   docusate sodium  100 mg Oral BID   feeding supplement (GLUCERNA SHAKE)  237 mL Oral TID BM   hydrALAZINE  50 mg Oral Q8H   hydrocortisone cream   Topical BID   insulin aspart  0-15 Units Subcutaneous TID WC   insulin aspart  0-5 Units Subcutaneous QHS   insulin aspart  3 Units Subcutaneous TID WC   insulin glargine-yfgn  15 Units Subcutaneous Daily   isosorbide mononitrate  15 mg Oral Daily   multivitamin with minerals  1 tablet Oral Daily   sodium chloride flush  3 mL Intravenous Q12H   sodium chloride flush  5 mL Intracatheter Q8H   Continuous Infusions:  sodium chloride 5 mL/hr at 05/29/22 2126   furosemide       LOS: 12 days   Darliss Cheney, MD [facility]  06/01/2022, 1:03 PM   *Please note that this is a verbal dictation therefore any spelling or grammatical errors are due to the "[employer]" system interpretation.  Please page via [place] and do not message via secure chat for urgent patient care matters. Secure chat can be used for non urgent patient care matters.  How to contact the [facility] Attending or Consulting provider [place] or covering provider during after hours [place], for this patient?  Check the care team in [facility] and look for a) attending/consulting [facility] provider listed and b) the [facility] team listed. Page or secure chat [phone]. Log into [email or web address] and  use [place]'s universal password to access. If you do not have the password, please contact the hospital operator. Locate the [facility] provider you are looking for under [facility] and page to a number that you can be directly reached. If you still have difficulty reaching the provider, please page the [facility] (Director on Call)  for the Hospitalists listed on amion for assistance.

## 2022-06-01 NOTE — Progress Notes (Signed)
Patient ID: Tracey Bryant, female   DOB: 09/24/1951, 71 y.o.   MRN: [id number] S: Still feeling weak and tired. O:BP (!) 125/52 (BP Location: Left Arm)   Pulse 60   Temp 98.1 F (36.7 C) (Oral)   Resp 20   Ht '5\' 6"'$  (1.676 m)   Wt 79.6 kg   SpO2 94%   BMI 28.32 kg/m   Intake/Output Summary (Last 24 hours) at 06/01/2022 1132 Last data filed at 06/01/2022 0900 Gross per 24 hour  Intake 1261.74 ml  Output 3365 ml  Net -2103.26 ml   Intake/Output: I/O last 3 completed shifts: In: 1441.7 [P.O.:1140; I.V.:69; Other:10; IV Piggyback:222.7] Out: 4165 [Urine:3350; Drains:815]  Intake/Output this shift:  Total I/O In: 240 [P.O.:240] Out: 600 [Urine:600] Weight change: -5.9 kg Gen:NAD CVS: RRR Resp: CTA Abd: +BS, soft, NT/Nd Ext: 1+ pretibial edema bilaterally  Recent Labs  Lab 05/26/22 0313 05/27/22 0402 05/28/22 0426 05/29/22 0356 05/30/22 0453 05/31/22 0630 06/01/22 0442  NA 136 134* 135 138 137 135 138  K 4.8 5.1 4.4 3.7 3.6 3.2* 3.8  CL 101 101 99 98 98 96* 97*  CO2 '27 26 27 29 27 31 '$ 33*  GLUCOSE 141* 195* 138* 111* 153* 153* 89  BUN 40* 43* 47* 51* 53* 48* 48*  CREATININE 3.10* 3.24* 3.08* 3.07* 2.94* 2.76* 2.94*  ALBUMIN 2.1* 2.2* 2.2* 2.2* 2.3* 2.3* 2.3*  CALCIUM 8.2* 8.1* 8.1* 8.3* 8.1* 8.2* 8.4*  PHOS 5.9* 6.3* 6.1* 5.4* 5.0* 5.0* 4.4   Liver Function Tests: Recent Labs  Lab 05/30/22 0453 05/31/22 0630 06/01/22 0442  ALBUMIN 2.3* 2.3* 2.3*   No results for input(s): LIPASE, AMYLASE in the last 168 hours. No results for input(s): AMMONIA in the last 168 hours. CBC: Recent Labs  Lab 05/26/22 0313 05/27/22 0402  WBC 5.9 6.7  NEUTROABS 3.8 4.4  HGB 10.1* 10.2*  HCT 31.5* 32.4*  MCV 90.0 89.8  PLT 287 348   Cardiac Enzymes: No results for input(s): CKTOTAL, CKMB, CKMBINDEX, TROPONINI in the last 168 hours. CBG: Recent Labs  Lab 05/31/22 1204 05/31/22 1531 05/31/22 2142 06/01/22 0559 06/01/22 1129  GLUCAP 168* 111* 140* 107* 148*    Iron  Studies:  Recent Labs    05/30/22 0453  IRON 30  TIBC 364   Studies/Results: IR EXCHANGE BILIARY DRAIN  Result Date: 05/31/2022 INDICATION: Cholelithiasis, status post percutaneous cholecystostomy catheter placement 03/19/2021 Dr. Jasmine December: CHOLECYSTOSTOMY CATHETER EXCHANGE UNDER FLUOROSCOPY MEDICATIONS: No antibiotics were indicated ANESTHESIA/SEDATION: None required FLUOROSCOPY: Radiation Exposure Index (as provided by the fluoroscopic device): 19 mGy Kerma COMPLICATIONS: None immediate. PROCEDURE: Informed written consent was obtained from the patient after a thorough discussion of the procedural risks, benefits and alternatives. All questions were addressed. Maximal Sterile Barrier Technique was utilized including caps, mask, sterile gowns, sterile gloves, sterile drape, hand hygiene and skin antiseptic. A timeout was performed prior to the initiation of the procedure. Catheter injection demonstrated small mobile filling defects in the lumen of the nondistended gallbladder. Cystic duct is patent. Catheter was cut and exchanged over Amplatz wire for a new 14 French pigtail device, formed centrally within the gallbladder lumen. Contrast injection confirms good positioning. No extravasation. IMPRESSION: 1. Technically successful 62 French cholecystostomy catheter exchange under fluoroscopy. 2. Patent cystic duct is demonstrated. 3. Mobile filling defects in the gallbladder suggesting gallstones or sludge balls. Electronically Signed   By: Lucrezia Europe M.D.   On: 05/31/2022 15:59    apixaban  5 mg Oral BID   carvedilol  6.25 mg Oral BID WC   docusate sodium  100 mg Oral BID   feeding supplement (GLUCERNA SHAKE)  237 mL Oral TID BM   hydrALAZINE  50 mg Oral Q8H   hydrocortisone cream   Topical BID   insulin aspart  0-15 Units Subcutaneous TID WC   insulin aspart  0-5 Units Subcutaneous QHS   insulin aspart  3 Units Subcutaneous TID WC   insulin glargine-yfgn  15 Units Subcutaneous Daily   isosorbide  mononitrate  15 mg Oral Daily   multivitamin with minerals  1 tablet Oral Daily   sodium chloride flush  3 mL Intravenous Q12H   sodium chloride flush  5 mL Intracatheter Q8H    BMET    Component Value Date/Time   NA 138 06/01/2022 0442   NA 141 05/04/2022 1625   K 3.8 06/01/2022 0442   CL 97 (L) 06/01/2022 0442   CO2 33 (H) 06/01/2022 0442   GLUCOSE 89 06/01/2022 0442   BUN 48 (H) 06/01/2022 0442   BUN 18 05/04/2022 1625   CREATININE 2.94 (H) 06/01/2022 0442   CALCIUM 8.4 (L) 06/01/2022 0442   GFRNONAA 17 (L) 06/01/2022 0442   GFRAA >90 01/17/2013 1915   CBC    Component Value Date/Time   WBC 6.7 05/27/2022 0402   RBC 3.61 (L) 05/27/2022 0402   HGB 10.2 (L) 05/27/2022 0402   HCT 32.4 (L) 05/27/2022 0402   PLT 348 05/27/2022 0402   MCV 89.8 05/27/2022 0402   MCH 28.3 05/27/2022 0402   MCHC 31.5 05/27/2022 0402   RDW 15.5 05/27/2022 0402   LYMPHSABS 1.2 05/27/2022 0402   MONOABS 0.6 05/27/2022 0402   EOSABS 0.4 05/27/2022 0402   BASOSABS 0.0 05/27/2022 0402    Assessment/Plan:   AKI/CKD stage IV - likely hemodynamically mediated in setting of acute systolic CHF exacerbation.  Scr has been slowly improving back to her baseline and is now 2.76.  Good UOP with IV lasix and slight bump in Scr so will decrease lasix from 120 mg tid to 120 mg bid.  No indication for dialysis at this time.  Pt is a marginal candidate given her poor functional status. Avoid nephrotoxic medications including NSAIDs and iodinated intravenous contrast exposure unless the latter is absolutely indicated.   Preferred narcotic agents for pain control are hydromorphone, fentanyl, and methadone. Morphine should not be used. Avoid Baclofen and avoid oral sodium phosphate and magnesium citrate based laxatives / bowel preps.  Continue strict Input and Output monitoring. Will monitor the patient closely with you and intervene or adjust therapy as indicated by changes in clinical status/labs Acute on chronic  systolic CHF - diuresed well overnight.  Continue with current regimen.   Recurrent right pleural effusion - s/p thoracentesis on 5/24 and again 05/28/22 with improved breathing. Subacute stroke - presented with dizziness and seen by MRI on 05/26/22 to left inferior basal ganglia.  Neurology consulted. Chronic cholecystitis - per IR.  Donetta Potts, MD [facility]

## 2022-06-01 NOTE — Plan of Care (Signed)
  Problem: Education: Goal: Ability to demonstrate management of disease process will improve Outcome: Progressing Goal: Ability to verbalize understanding of medication therapies will improve Outcome: Progressing   Problem: Activity: Goal: Capacity to carry out activities will improve Outcome: Progressing   Problem: Cardiac: Goal: Ability to achieve and maintain adequate cardiopulmonary perfusion will improve Outcome: Progressing   

## 2022-06-01 NOTE — Care Management Important Message (Signed)
Important Message  Patient Details  Name: Tracey Bryant MRN: [id number] Date of Birth: 10-May-1951   Medicare Important Message Given:  Yes     Shelda Altes 06/01/2022, 8:25 AM

## 2022-06-01 NOTE — Progress Notes (Signed)
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    Patient was recently admitted 12/25/21 - 12/27/21 for CHF exacerbation - diuresed with Lasix IV 40 mg BID with total of -4.58 L volume removed.  Echo in December 2022 showed improvement in LVEF 40-45% (25-30% in 02/2021) with global hypokinesis, normal RV, and moderately elevated pulmonary pressures.  Discharge weight was 162 lbs.  Discharged on carvedilol 6.25 mg BID and furosemide 40 mg daily and was instructed to stop amlodipine 10 mg daily and BiDIl 20-37.5 mg TID.   On 05/24, she presented to her Cardiology office thinking she had an appointment and endorsed SOB, bilateral LEE, and reduced activity tolerance; MD referred her to [facility] ED.  Patient endorsed progressive symptoms despite recent increase Lasix 40 mg daily > BID at cardiology visit 05/09 where she reported worsening LEE, SOB, orthopnea, and PND.  CXR in the ED showed moderate R pleural effusion; dual PPM evident.  Repeat CXR 6h later demonstrated improvement in R pleural effusion, cardiomegaly and no evidence of interstitial edema.  Underwent R thoracentesis with 1.3 L removed.  She was started on IV diuresis 05/24, received Lasix IV 40 mg x1 and 60 mg x2 with marginal response , but her 1+ bilateral LE pitting edema was improved.    Breathing had improved on 05/25 s/p thoracentesis and diuresis, but renal function worsened (SCr 2.13 > 2.37, baseline ~1.6) and patient reported dizziness so diuretics were held.  Echo on 05/25 showed improvement in LVEF to 50-55% and elevated LVEDP (21) with new G2DD; still with normal RV and mildly elevated pulmonary pressures.    Patient continued to endorse SOB, feeling  swollen and fatigued w/ bilateral 2+ pitting LEE and JVD over the weekend (05/27-28).  She had minimal response of 550cc to Lasix IV 40 mg BID and was given 80 mg x1; nephrology also consulted and redosed 80 mg x1 the following day after ~1.4 L uop and worsening renal function (SCr 3.0 > 3.19).    She had a near syncopal event on 05/30 while sitting on the bed to perform orthostatics, was SOB while speaking and appeared more volume overloaded on exam.  JVD and bilateral 2+ pitting edema still present - nephrology increased Lasix to 80 mg IV x2.  She was taken for a MRI of brain 05/30 which revealed a probable small subacute infarct.    Repeat CXR 5/31 showed stable cardiomegaly with moderate right and small left pleural effusions.  Possible plans for HD if she does not make an adequate amount of urine with aggressive diuresis.  S/p thoracentesis on 6/2 with 900 cc of fluid removed.  Current HF Medications: Diuretic: furosemide IV 120 mg BID per nephrology Beta Blocker: carvedilol 6.25 mg BID Other: hydralazine 50 mg q8h + Imdur 15 mg daily  Prior to admission HF Medications: Diuretic: furosemide 40 mg BID Beta blocker: carvedilol 6.25 mg BID Other: hydralazine 25 mg BID + Imdur 15 mg daily ; potassium 20 mEq BID  Pertinent Lab Values: Serum creatinine 2.94, BUN 48, Potassium 3.8, Sodium 138, BNP 2083.9, Magnesium 2.7, Ac 9.1% (up from 7.7% in Jan) TSAT 8, no ferritin level  Vital Signs: Weight: 175 lbs (admission weight: 201 lbs) Blood pressure:  130/60s Heart rate: 60s I/O: -2.5L yesterday; net -11.5L   Medication Assistance / Insurance Benefits Check: Does the patient have prescription insurance?  Yes Type of insurance plan: [name] [employer]  Does the patient qualify for medication assistance through manufacturers or grants?   Yes Eligible grants and/or patient assistance programs: pending initiation of ARNI/SGLT2i Medication assistance applications in progress: pending   Medication assistance applications approved: pending  Outpatient Pharmacy:  Prior to admission outpatient pharmacy: [facility] Is the patient willing to use [facility] at discharge? Pending Is the patient willing to transition their outpatient pharmacy to utilize a [facility] outpatient pharmacy?   Pending    Assessment: 1. 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 NYHA class III symptoms. Breathing improved today. No JVD and 1+ pitting bilateral LEE on exam.   GDMT initiation/titration limited by poor renal function.  - Diuresing with Lasix IV 120 mg BID per renal  - Continue PTA carvediolol 6.25 mg BID - No ACEi/ARB/ARNI or MRA with significant renal dysfunction  - No SGLT2i with h/o UTI during March-April 2022 admission, A1c up 7.7% > 9.1% since Jan, and eGFR <20. UA this admission with many bacteria, nitrite negative. UCx with >100k ecoli - treating with ceftriaxone - Continue PTA hydralazine 50 mg q8h + Imdur 15 mg daily    Plan: 1) Medication changes recommended at this time: - Continue regimen as above - Maintain Mg > 2 and K > 4 - Stop PTA amlodipine on discharge to allow for BP room with GDMT initiation/titration pending renal function improvement  2) Patient assistance: [name] = $47  3)  Education  - To be completed prior to discharge  Kerby Nora, PharmD, BCPS Heart Failure Stewardship Pharmacist Phone [phone]  Please check [email or web address] for unit-specific pharmacist phone numbers

## 2022-06-01 NOTE — Progress Notes (Signed)
Progress Note  Patient Name: Tracey Bryant Date of Encounter: 06/01/2022  [facility] Cardiologist: Minus Breeding, MD   Subjective   Net -2 L yesterday, -11 L on admission.  Creatinine stable (2.94 > 2.76 > 2.94).  BP 125/52.  Reports dyspnea improved.  Inpatient Medications    Scheduled Meds:  apixaban  5 mg Oral BID   carvedilol  6.25 mg Oral BID WC   docusate sodium  100 mg Oral BID   feeding supplement (GLUCERNA SHAKE)  237 mL Oral TID BM   hydrALAZINE  50 mg Oral Q8H   hydrocortisone cream   Topical BID   insulin aspart  0-15 Units Subcutaneous TID WC   insulin aspart  0-5 Units Subcutaneous QHS   insulin aspart  3 Units Subcutaneous TID WC   insulin glargine-yfgn  15 Units Subcutaneous Daily   isosorbide mononitrate  15 mg Oral Daily   multivitamin with minerals  1 tablet Oral Daily   sodium chloride flush  3 mL Intravenous Q12H   sodium chloride flush  5 mL Intracatheter Q8H   Continuous Infusions:  sodium chloride 5 mL/hr at 05/29/22 2126   furosemide     PRN Meds: sodium chloride, acetaminophen **OR** acetaminophen, bisacodyl, diphenhydrAMINE, hydrALAZINE, HYDROmorphone (DILAUDID) injection, lidocaine (PF), ondansetron **OR** ondansetron (ZOFRAN) IV, oxyCODONE, polyethylene glycol, traZODone   Vital Signs    Vitals:   05/31/22 1221 05/31/22 2036 06/01/22 0358 06/01/22 0548  BP: 132/65 118/75 (!) 134/58 (!) 125/52  Pulse: 64 64 63 60  Resp: '14 18 15 20  '$ Temp:  98.2 F (36.8 C) 98.1 F (36.7 C) 98.1 F (36.7 C)  TempSrc:  Oral Oral Oral  SpO2: 92% 98% 94%   Weight:   79.6 kg   Height:        Intake/Output Summary (Last 24 hours) at 06/01/2022 1057 Last data filed at 06/01/2022 0900 Gross per 24 hour  Intake 1261.74 ml  Output 3365 ml  Net -2103.26 ml       06/01/2022    3:58 AM 05/31/2022    3:52 AM 05/30/2022    3:57 AM  Last 3 Weights  Weight (lbs) 175 lb 7.8 oz 188 lb 7.9 oz 190 lb 11.2 oz  Weight (kg) 79.6 kg 85.5 kg 86.5 kg       Telemetry    Paced rhythm - Personally Reviewed  ECG    No new tracings - Personally Reviewed  Physical Exam   GEN: No acute distress.   Neck: No JVD Cardiac: RRR, no murmurs, rubs, or gallops.  Respiratory: right with good breath sounds, some crackles on left GI: Soft, nontender, non-distended  MS: 1+ B LE edema Neuro:  Nonfocal  Psych: Normal affect   Labs    High Sensitivity Troponin:  No results for input(s): TROPONINIHS in the last 720 hours.   Chemistry Recent Labs  Lab 05/26/22 0313 05/27/22 0402 05/28/22 0426 05/30/22 0453 05/31/22 0630 06/01/22 0442  NA 136 134*   < > 137 135 138  K 4.8 5.1   < > 3.6 3.2* 3.8  CL 101 101   < > 98 96* 97*  CO2 27 26   < > 27 31 33*  GLUCOSE 141* 195*   < > 153* 153* 89  BUN 40* 43*   < > 53* 48* 48*  CREATININE 3.10* 3.24*   < > 2.94* 2.76* 2.94*  CALCIUM 8.2* 8.1*   < > 8.1* 8.2* 8.4*  MG 2.8* 2.9*  --  2.7*  --   --   ALBUMIN 2.1* 2.2*   < > 2.3* 2.3* 2.3*  GFRNONAA 16* 15*   < > 17* 18* 17*  ANIONGAP 8 7   < > '12 8 8   '$ < > = values in this interval not displayed.     Lipids  Recent Labs  Lab 05/26/22 0313  CHOL 99  TRIG 54  HDL 36*  LDLCALC 52  CHOLHDL 2.8     Hematology Recent Labs  Lab 05/26/22 0313 05/27/22 0402  WBC 5.9 6.7  RBC 3.50* 3.61*  HGB 10.1* 10.2*  HCT 31.5* 32.4*  MCV 90.0 89.8  MCH 28.9 28.3  MCHC 32.1 31.5  RDW 15.6* 15.5  PLT 287 348    Thyroid No results for input(s): TSH, FREET4 in the last 168 hours.  BNPNo results for input(s): BNP, PROBNP in the last 168 hours.  DDimer No results for input(s): DDIMER in the last 168 hours.   Radiology    IR EXCHANGE BILIARY DRAIN  Result Date: 05/31/2022 INDICATION: Cholelithiasis, status post percutaneous cholecystostomy catheter placement 03/19/2021 Dr. Jasmine December: CHOLECYSTOSTOMY CATHETER EXCHANGE UNDER FLUOROSCOPY MEDICATIONS: No antibiotics were indicated ANESTHESIA/SEDATION: None required FLUOROSCOPY: Radiation Exposure Index (as  provided by the fluoroscopic device): 19 mGy Kerma COMPLICATIONS: None immediate. PROCEDURE: Informed written consent was obtained from the patient after a thorough discussion of the procedural risks, benefits and alternatives. All questions were addressed. Maximal Sterile Barrier Technique was utilized including caps, mask, sterile gowns, sterile gloves, sterile drape, hand hygiene and skin antiseptic. A timeout was performed prior to the initiation of the procedure. Catheter injection demonstrated small mobile filling defects in the lumen of the nondistended gallbladder. Cystic duct is patent. Catheter was cut and exchanged over Amplatz wire for a new 14 French pigtail device, formed centrally within the gallbladder lumen. Contrast injection confirms good positioning. No extravasation. IMPRESSION: 1. Technically successful 61 French cholecystostomy catheter exchange under fluoroscopy. 2. Patent cystic duct is demonstrated. 3. Mobile filling defects in the gallbladder suggesting gallstones or sludge balls. Electronically Signed   By: Lucrezia Europe M.D.   On: 05/31/2022 15:59    Cardiac Studies   Echo 05/20/22: 1. Left ventricular ejection fraction, by estimation, is 50 to 55%. The  left ventricle has low normal function. The left ventricle has no regional  wall motion abnormalities. Left ventricular diastolic parameters are  consistent with Grade II diastolic  dysfunction (pseudonormalization). Elevated left ventricular end-diastolic  pressure. The E/e' is 21.   2. Right ventricular systolic function is normal. The right ventricular  size is mildly enlarged. There is mildly elevated pulmonary artery  systolic pressure. The estimated right ventricular systolic pressure is  14.4 mmHg.   3. Left atrial size was mildly dilated.   4. The mitral valve is abnormal. Trivial mitral valve regurgitation.   5. The aortic valve is tricuspid. Aortic valve regurgitation is not  visualized.   6. The inferior vena  cava is normal in size with <50% respiratory  variability, suggesting right atrial pressure of 8 mmHg.   Patient Profile     71 y.o. female with a history of chronic combined CHF with EF previously as low as 25-30% but improved to 40-45% on Echo in 11/2021 and 50-55% on Echo this admission, paroxysmal atrial fibrillation on Eliquis, hypertension, type 2 diabetes mellitus, and CKD stage IV who was admitted on 05/19/2022 with acute on chronic combined CHF.  Assessment & Plan    Acute on chronic combined heart failure  Hypertension Acute on chronic CKD stage IV BNP elevated 2083 with CXR showing interval progression of right base collapse/consolidation and right pleural effusion - echo this admission with LVEF 50-55%, previously as low as 25-30% - thoracentesis as below - defer to nephrology for diuresis - currently on 120 mg IV lasix TID with 2.5 mg metolazone yesterday --> excellent diuresis, volume status improved - GDMT: coreg 6.25 mg BID, hydralazine 50 mg TID, imdur 15 mg daily - BP 120-130s  Right pleural effusion - recurrent - s/p thoracentesis 05/19/22 with 1.3 L fluid removed - s/p thoracentesis on 05/28/22 with 900 cc fluid removed - ?related to chole drain - improvement in breathing after thoracentesis  PAF Chronic anticoagulation S/P dual chamber PPM - medtronic Telemetry with paced rhythm On coreg and eliquis  Dizziness  Subacute stroke - Brain MRI 05/26/22 with probably small subacute infarct in left inferior basal ganglia - Neurology consulted - do not feel this is the etiology of her dizziness - continue eliquis   UTI - E. Coli - continue ABX per primary   Chronic cholecystitis - chole drain flushed aggressively in IR with better drainage - per IR, will need drain changed prior to discharge   [place] will sign off.   Medication Recommendations: Coreg 6.25 mg twice daily, Imdur 15 mg daily, hydralazine 50 mg 3 times daily.  Diuresis per nephrology Other  recommendations (labs, testing, etc): None Follow up as an outpatient: Will schedule    For questions or updates, please contact [place] Please consult [email or web address] for contact info under        Signed, Donato Heinz, MD  06/01/2022, 10:57 AM

## 2022-06-01 NOTE — Progress Notes (Signed)
Occupational Therapy Treatment Patient Details Name: Tracey Bryant MRN: [id number] DOB: April 22, 1951 Today's Date: 06/01/2022   History of present illness The pt is a 71 yo female presenting from home 5/24 with SOB and BLE swelling. Found to have acute CHF exacerbation and R pleural effusion s/p thoracentesis 5/24.  Underwent right-sided ultrasound guided thoracentesis on 6/2 and had R cholecystosomy catheter exchange performed in IR on 6/5.  PMH includes: arthritis, CHF, DM II, DVT, HTN, afib, and stroke.   OT comments  Patient received in supine and eager to participate. Patient was min assist to get to EOB and had a "passing out" episode with eyes often, distance stare and was easily aroused with patient being disoriented. Patient was assisted to recliner and during grooming task had another episode.  Nursing informed and patient remained in recliner at end of session. Patient to continue to be followed by acute OT.    Recommendations for follow up therapy are one component of a multi-disciplinary discharge planning process, led by the attending physician.  Recommendations may be updated based on patient status, additional functional criteria and insurance authorization.    Follow Up Recommendations  Skilled nursing-short term rehab (<3 hours/day)    Assistance Recommended at Discharge Intermittent Supervision/Assistance  Patient can return home with the following  Assistance with cooking/housework;Help with stairs or ramp for entrance;A lot of help with walking and/or transfers;A lot of help with bathing/dressing/bathroom   Equipment Recommendations  Other (comment) (TBD)    Recommendations for Other Services      Precautions / Restrictions Precautions Precautions: Fall Precaution Comments: psuedo-syncopal episodes with supine to sit (hold onto her so she doesn't fall over) Restrictions Weight Bearing Restrictions: No       Mobility Bed Mobility Overal bed mobility: Needs  Assistance Bed Mobility: Supine to Sit     Supine to sit: Min assist, HOB elevated     General bed mobility comments: once up in sitting position patient went limp and leaned to left. Patient demonstrated gaze and was easily aroused    Transfers Overall transfer level: Needs assistance Equipment used: None Transfers: Bed to chair/wheelchair/BSC, Sit to/from Stand Sit to Stand: Mod assist Stand pivot transfers: Mod assist         General transfer comment: stand pivot transfer performed for increased safety     Balance Overall balance assessment: Needs assistance Sitting-balance support: Single extremity supported, No upper extremity supported, Feet supported Sitting balance-Leahy Scale: Poor Sitting balance - Comments: patient demonstrated unsteady sitting following episode and was transferred to recliner for increased trunk support   Standing balance support: During functional activity Standing balance-Leahy Scale: Poor Standing balance comment: stood for tranfer only                           ADL either performed or assessed with clinical judgement   ADL Overall ADL's : Needs assistance/impaired     Grooming: Wash/dry hands;Wash/dry face;Oral care;Brushing hair;Supervision/safety;Sitting Grooming Details (indicate cue type and reason): supervision for safety due to "passing out" episodes Upper Body Bathing: Supervision/ safety;Sitting                             General ADL Comments: Patient had a "passing out" episode while seated in recliner during grooming tasks    Extremity/Trunk Assessment              Vision  Perception     Praxis      Cognition Arousal/Alertness: Awake/alert Behavior During Therapy: WFL for tasks assessed/performed Overall Cognitive Status: Impaired/Different from baseline Area of Impairment: Memory, Problem solving, Safety/judgement                     Memory: Decreased short-term memory    Safety/Judgement: Decreased awareness of safety, Decreased awareness of deficits Awareness: Intellectual Problem Solving: Slow processing, Requires verbal cues General Comments: patient had "passing out" episode when going from supine to sit and another while seated in recliner during grooming tasks        Exercises      Shoulder Instructions       General Comments      Pertinent Vitals/ Pain       Pain Assessment Pain Assessment: No/denies pain Pain Intervention(s): Monitored during session  Home Living                                          Prior Functioning/Environment              Frequency  Min 2X/week        Progress Toward Goals  OT Goals(current goals can now be found in the care plan section)  Progress towards OT goals: Progressing toward goals  Acute Rehab OT Goals Patient Stated Goal: get better OT Goal Formulation: With patient Time For Goal Achievement: 06/03/22 Potential to Achieve Goals: Fair ADL Goals Pt Will Perform Lower Body Dressing: with modified independence;with adaptive equipment;sitting/lateral leans;sit to/from stand Pt Will Transfer to Toilet: with modified independence;ambulating;bedside commode Pt Will Perform Toileting - Clothing Manipulation and hygiene: with modified independence;sitting/lateral leans;sit to/from stand Additional ADL Goal #1: Pt will be Mod I bed mobility in preparation for increased participation w/ ADL's Additional ADL Goal #2: Pt will state and implement 1-2 energy conservation techniques w/ less than 2 vc's (issue and review handout)  Plan Discharge plan remains appropriate    Co-evaluation                 AM-PAC OT "6 Clicks" Daily Activity     Outcome Measure   Help from another person eating meals?: None Help from another person taking care of personal grooming?: A Little Help from another person toileting, which includes using toliet, bedpan, or urinal?: A Lot Help  from another person bathing (including washing, rinsing, drying)?: A Lot Help from another person to put on and taking off regular upper body clothing?: A Little Help from another person to put on and taking off regular lower body clothing?: A Lot 6 Click Score: 16    End of Session Equipment Utilized During Treatment: Gait belt  OT Visit Diagnosis: Unsteadiness on feet (R26.81);Muscle weakness (generalized) (M62.81)   Activity Tolerance Other (comment) ("passing out" episodes this treatment session)   Patient Left in chair;with call bell/phone within reach;with chair alarm set   Nurse Communication Mobility status;Other (comment) (informed on episodes of passing out)        Time: 2035-5974 OT Time Calculation (min): 37 min  Charges: OT General Charges $OT Visit: 1 Visit OT Treatments $Self Care/Home Management : 23-37 mins  Lodema Hong, [place]  Pager [phone] Office [phone]   Trixie Dredge 06/01/2022, 9:17 AM

## 2022-06-02 DIAGNOSIS — I5043 Acute on chronic combined systolic (congestive) and diastolic (congestive) heart failure: Secondary | ICD-10-CM | POA: Diagnosis not present

## 2022-06-02 LAB — GLUCOSE, CAPILLARY
Glucose-Capillary: 93 mg/dL (ref 70–99)
Glucose-Capillary: 133 mg/dL — ABNORMAL HIGH (ref 70–99)
Glucose-Capillary: 147 mg/dL — ABNORMAL HIGH (ref 70–99)
Glucose-Capillary: 175 mg/dL — ABNORMAL HIGH (ref 70–99)

## 2022-06-02 LAB — RENAL FUNCTION PANEL
Albumin: 2.3 g/dL — ABNORMAL LOW (ref 3.5–5.0)
Anion gap: 13 (ref 5–15)
BUN: 48 mg/dL — ABNORMAL HIGH (ref 8–23)
CO2: 31 mmol/L (ref 22–32)
Calcium: 8.4 mg/dL — ABNORMAL LOW (ref 8.9–10.3)
Chloride: 93 mmol/L — ABNORMAL LOW (ref 98–111)
Creatinine, Ser: 3.01 mg/dL — ABNORMAL HIGH (ref 0.44–1.00)
GFR, Estimated: 16 mL/min — ABNORMAL LOW (ref >60)
Glucose, Bld: 84 mg/dL (ref 70–99)
Phosphorus: 4.8 mg/dL — ABNORMAL HIGH (ref 2.5–4.6)
Potassium: 3.6 mmol/L (ref 3.5–5.1)
Sodium: 137 mmol/L (ref 135–145)

## 2022-06-02 MED ORDER — POTASSIUM CHLORIDE CRYS ER 20 MEQ PO TBCR
40.0000 meq | EXTENDED_RELEASE_TABLET | Freq: Once | ORAL | Status: AC
  Administered 2022-06-02: 40 meq via ORAL
  Filled 2022-06-02: qty 2

## 2022-06-02 MED ORDER — MECLIZINE HCL 25 MG PO TABS
12.5000 mg | ORAL_TABLET | Freq: Three times a day (TID) | ORAL | Status: DC | PRN
Start: 2022-06-02 — End: 2022-06-09
  Filled 2022-06-02: qty 1

## 2022-06-02 NOTE — Progress Notes (Signed)
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    Patient was recently admitted 12/25/21 - 12/27/21 for CHF exacerbation - diuresed with Lasix IV 40 mg BID with total of -4.58 L volume removed.  Echo in December 2022 showed improvement in LVEF 40-45% (25-30% in 02/2021) with global hypokinesis, normal RV, and moderately elevated pulmonary pressures.  Discharge weight was 162 lbs.  Discharged on carvedilol 6.25 mg BID and furosemide 40 mg daily and was instructed to stop amlodipine 10 mg daily and BiDIl 20-37.5 mg TID.   On 05/24, she presented to her Cardiology office thinking she had an appointment and endorsed SOB, bilateral LEE, and reduced activity tolerance; MD referred her to [facility] ED.  Patient endorsed progressive symptoms despite recent increase Lasix 40 mg daily > BID at cardiology visit 05/09 where she reported worsening LEE, SOB, orthopnea, and PND.  CXR in the ED showed moderate R pleural effusion; dual PPM evident.  Repeat CXR 6h later demonstrated improvement in R pleural effusion, cardiomegaly and no evidence of interstitial edema.  Underwent R thoracentesis with 1.3 L removed.  She was started on IV diuresis 05/24, received Lasix IV 40 mg x1 and 60 mg x2 with marginal response , but her 1+ bilateral LE pitting edema was improved.    Breathing had improved on 05/25 s/p thoracentesis and diuresis, but renal function worsened (SCr 2.13 > 2.37, baseline ~1.6) and patient reported dizziness so diuretics were held.  Echo on 05/25 showed improvement in LVEF to 50-55% and elevated LVEDP (21) with new G2DD; still with normal RV and mildly elevated pulmonary pressures.    Patient continued to endorse SOB, feeling  swollen and fatigued w/ bilateral 2+ pitting LEE and JVD over the weekend (05/27-28).  She had minimal response of 550cc to Lasix IV 40 mg BID and was given 80 mg x1; nephrology also consulted and redosed 80 mg x1 the following day after ~1.4 L uop and worsening renal function (SCr 3.0 > 3.19).    She had a near syncopal event on 05/30 while sitting on the bed to perform orthostatics, was SOB while speaking and appeared more volume overloaded on exam.  JVD and bilateral 2+ pitting edema still present - nephrology increased Lasix to 80 mg IV x2.  She was taken for a MRI of brain 05/30 which revealed a probable small subacute infarct.    Repeat CXR 5/31 showed stable cardiomegaly with moderate right and small left pleural effusions.  Possible plans for HD if she does not make an adequate amount of urine with aggressive diuresis.  S/p thoracentesis on 6/2 with 900 cc of fluid removed.  Current HF Medications: Diuretic: furosemide IV 120 mg BID per nephrology Beta Blocker: carvedilol 6.25 mg BID Other: hydralazine 50 mg q8h + Imdur 15 mg daily  Prior to admission HF Medications: Diuretic: furosemide 40 mg BID Beta blocker: carvedilol 6.25 mg BID Other: hydralazine 25 mg BID + Imdur 15 mg daily; potassium 20 mEq BID  Pertinent Lab Values: Serum creatinine 3.01, BUN 48, Potassium 3.6, Sodium 137, BNP 2083.9, Magnesium 2.7, Ac 9.1% (up from 7.7% in Jan) TSAT 8, no ferritin level  Vital Signs: Weight: 175 lbs (admission weight: 201 lbs) Blood pressure: 130/60s  Heart rate: 60s I/O: -1.7L yesterday; net -12.5L   Medication Assistance / Insurance Benefits Check: Does the patient have prescription insurance?  Yes Type of insurance plan: [name]  Does the patient qualify for medication assistance through manufacturers or grants?   Yes Eligible grants and/or patient assistance programs: pending initiation of ARNI/SGLT2i Medication assistance applications in progress: pending   Medication assistance applications approved: pending  Outpatient Pharmacy:  Prior to admission outpatient pharmacy: [facility] Is the patient willing to use [facility] pharmacy at discharge? Pending Is the patient willing to transition their outpatient pharmacy to utilize a [facility] outpatient pharmacy?   Pending    Assessment: 1. 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 NYHA class III symptoms. No JVD and 1+ pitting bilateral LEE on exam.   GDMT initiation/titration limited by poor renal function.  - Diuresing with Lasix IV 120 mg BID per renal - K 3.6 - needs replacement - Continue PTA carvediolol 6.25 mg BID - No ACEi/ARB/ARNI or MRA with significant renal dysfunction  - No SGLT2i with h/o UTI during March-April 2022 admission, A1c up 7.7% > 9.1% since Jan, and eGFR <20. UA this admission with many bacteria, nitrite negative. UCx with >100k ecoli - treating with ceftriaxone - Continue PTA hydralazine 50 mg q8h + Imdur 15 mg daily    Plan: 1) Medication changes recommended at this time: - Replace K - 40 mEq x 1 - Maintain Mg > 2 and K > 4 - Stop PTA amlodipine on discharge to allow for BP room with GDMT initiation/titration pending renal function improvement  2) Patient assistance: [name] = $47  3)  Education  - To be completed prior to discharge  Kerby Nora, PharmD, BCPS Heart Failure Stewardship Pharmacist Phone [phone]  Please check [email or web address] for unit-specific pharmacist phone numbers

## 2022-06-02 NOTE — Progress Notes (Addendum)
Physical Therapy Treatment Patient Details Name: Tracey Bryant MRN: [id number] DOB: [birth year] Today's Date: 06/02/2022   History of Present Illness The pt is a 70 yo female presenting from home 5/24 with SOB and BLE swelling. Found to have acute CHF exacerbation and R pleural effusion s/p thoracentesis 5/24.  Noted that pt was found to have subacute infarct left basal ganglia on 5/31 and Neuro was consulted.   Underwent right-sided ultrasound guided thoracentesis on 6/2 and had R cholecystosomy catheter exchange performed in IR on 6/5.  PMH includes: arthritis, CHF, DM II, DVT, HTN, afib, and stroke.    PT Comments    Pt admitted with above diagnosis. Pt was unable to sit EOB for over about 10 seconds as she would shake and fall back on bed unless supported.  Pt would not blink to threat initially upon lying down but arouses fairly quickly with slowed speech as well.  Messaged MD to let her know limitations with PT and mobility due to these symptoms.  Will continue to follow acutely as able. Pt met 0/5 goals due to pt with multiple episodes of "passing out".  Revised goals. Pt currently with functional limitations due to balance and endurance deficits. Pt will benefit from skilled PT to increase their independence and safety with mobility to allow discharge to the venue listed below.      Recommendations for follow up therapy are one component of a multi-disciplinary discharge planning process, led by the attending physician.  Recommendations may be updated based on patient status, additional functional criteria and insurance authorization.  Follow Up Recommendations  Skilled nursing-short term rehab (<3 hours/day)     Assistance Recommended at Discharge Frequent or constant Supervision/Assistance  Patient can return home with the following Two people to help with walking and/or transfers;Two people to help with bathing/dressing/bathroom;Assistance with cooking/housework;Direct supervision/assist  for medications management;Direct supervision/assist for financial management;Assist for transportation;Help with stairs or ramp for entrance   Equipment Recommendations  None recommended by PT    Recommendations for Other Services       Precautions / Restrictions Precautions Precautions: Fall Precaution Comments: psuedo-syncopal episodes with supine to sit (hold onto her so she doesn't fall over) Restrictions Weight Bearing Restrictions: No     Mobility  Bed Mobility Overal bed mobility: Needs Assistance Bed Mobility: Supine to Sit Rolling: Min assist Sidelying to sit: HOB elevated, Min assist Supine to sit: Min assist, HOB elevated Sit to supine: Min assist   General bed mobility comments: once up in sitting position patient initially was shaking all 4 extremities and then went limp and leaned to right thrusting self back on bed. Patient demonstrated gaze and was easily aroused.  Pt did this 3 x.  Messaged MD to see if she would come and see this bizarre presentation.    Transfers                   General transfer comment: NT for pt safety    Ambulation/Gait                   Stairs             Wheelchair Mobility    Modified Rankin (Stroke Patients Only) Modified Rankin (Stroke Patients Only) Pre-Morbid Rankin Score: No significant disability Modified Rankin: Severe disability     Balance Overall balance assessment: Needs assistance Sitting-balance support: Single extremity supported, No upper extremity supported, Feet supported Sitting balance-Leahy Scale: Poor Sitting balance - Comments: patient demonstrated unsteady  sitting following episode                                    Cognition Arousal/Alertness: Awake/alert Behavior During Therapy: WFL for tasks assessed/performed Overall Cognitive Status: Impaired/Different from baseline Area of Impairment: Memory, Problem solving, Safety/judgement                      Memory: Decreased short-term memory   Safety/Judgement: Decreased awareness of safety, Decreased awareness of deficits Awareness: Intellectual Problem Solving: Slow processing, Requires verbal cues General Comments: patient had "passing out" episode each time when going from supine to sit 3 x        Exercises General Exercises - Upper Extremity Shoulder Horizontal ABduction: Strengthening, Both, 10 reps, Seated, AAROM, AROM General Exercises - Lower Extremity Ankle Circles/Pumps: AROM, 10 reps, Both, Supine Heel Slides: AROM, AAROM, Both, 10 reps, Supine Hip ABduction/ADduction: Strengthening, Both, 10 reps, Supine (pillow squeezes with 5 sec hold)    General Comments General comments (skin integrity, edema, etc.): Briefly tried to check vision with pt having difficulty following commands for vision testing. Pt possibly has right hypofunction but would not follow the testing well enough to definitely determine.      Pertinent Vitals/Pain Pain Assessment Pain Assessment: No/denies pain    Home Living                          Prior Function            PT Goals (current goals can now be found in the care plan section) Acute Rehab PT Goals Patient Stated Goal: return to greater independence PT Goal Formulation: With patient Time For Goal Achievement: 06/16/22 Potential to Achieve Goals: Good Progress towards PT goals: Goals downgraded-see care plan;Not progressing toward goals - comment (Multiple episodes of "passing out")    Frequency    Min 3X/week      PT Plan Current plan remains appropriate    Co-evaluation              AM-PAC PT "6 Clicks" Mobility   Outcome Measure  Help needed turning from your back to your side while in a flat bed without using bedrails?: A Little Help needed moving from lying on your back to sitting on the side of a flat bed without using bedrails?: A Lot Help needed moving to and from a bed to a chair (including  a wheelchair)?: Total Help needed standing up from a chair using your arms (e.g., wheelchair or bedside chair)?: Total Help needed to walk in hospital room?: Total Help needed climbing 3-5 steps with a railing? : Total 6 Click Score: 9    End of Session Equipment Utilized During Treatment: Gait belt Activity Tolerance: Patient limited by fatigue (limited by episode of shaking and "passing out") Patient left: with call bell/phone within reach;in bed;with bed alarm set Nurse Communication: Mobility status;Need for lift equipment PT Visit Diagnosis: Other abnormalities of gait and mobility (R26.89);Muscle weakness (generalized) (M62.81)     Time: 7195-9747 PT Time Calculation (min) (ACUTE ONLY): 27 min  Charges:  $Therapeutic Exercise: 8-22 mins $Therapeutic Activity: 8-22 mins                     [facility] M,PT Acute Rehab Services [place] 06/02/2022, 1:33 PM

## 2022-06-02 NOTE — Progress Notes (Signed)
PROGRESS NOTE    Tracey Bryant  NGE:[id number] DOB: 1951/12/07 DOA: 05/19/2022 PCP: Coolidge Breeze, FNP   Brief Narrative:  Tracey Bryant is a 71 y.o. female with PMH cholecystitis with cholecystostomy tube in place (initially placed 03/19/2021), afib, HTN, DMII, sCHF, CVA who presented with shortness of breath.  She also had reported increased lower extremity swelling/overload with no improvement on her home Lasix.   She underwent thoracentesis on 05/19/2022 removing 1.3 L.  Initially Lasix was held after developing some dizziness.Respiratory status improved after thoracentesis.     She also was evaluated by IR due to difficulty with cholecystostomy tube drainage.  Tube was evaluated and noted intact however suture had been removed but tube had not been retracted any.  After forceful flushing, biliary output was achieved with no further issues.   Due to ongoing dizziness, MRI brain was obtained on 5/30 positive for subacute infarct.  Neurology consulted.   6/2: repeat thoracentesis with IR, 900 cc drained, symptomatic improvement     Assessment & Plan:   Principal Problem:   Acute on chronic combined systolic and diastolic congestive heart failure (HCC) Active Problems:   Acute renal failure superimposed on stage 4 chronic kidney disease (HCC)   Dizziness   Cholecystitis, chronic   Acute respiratory failure with hypoxia (HCC)   Physical deconditioning   Hypertension   Uncontrolled type 2 diabetes mellitus with hyperglycemia, with long-term current use of insulin (HCC)   Paroxysmal atrial fibrillation (HCC)   History of CVA (cerebrovascular accident)  Acute on chronic combined systolic and diastolic congestive heart failure (HCC)/Acute respiratory failure with hypoxia: Underwent thoracentesis on 5/24 with removal of 1.3 L with symptomatic improvement. Initially Lasix held due to worsening renal function, dizziness however continued to have volume overload with persisting lower  extremity edema.  Lasix resumed.  She underwent repeat thoracentesis on 6/2 and removal of 900 cc fluid with notable symptomatic improvement again.  EF on most recent echo notably 50-55%, grade II diastolic heart failure. Suspected pt gained fluid over a significant amount of time.  -Cardiology, nephrology following, appreciate recommendations, negative balance of 12.5 L.  Remains on high dose of IV Lasix and had has been receiving metolazone intermittently as well, slight bump in creatinine again today, nephrology continuing twice daily high-dose Lasix. -coreg 6.25 mg BID -hydral 50 q8h, imdur 15 qD -holding sglt2i   Moderate to large right-sided pleural effusion Initial thoracentesis on 5/24. CXR on 5/31 showed moderate right and small left pleural effusion, ordered right-sided ultrasound guided thoracentesis, completed 6/2.    E. coli UTI Urine culture showed more than 100,000 colonies of E. Coli, resistant to ampicillin.  -IV Rocephin 6/1-6/2, transitioned to PO keflex 500 mg BID, last dose on 06/01/2022.   Acute kidney injury superimposed on stage 3b-4 CKD (HCC) Baseline creatinine around 2.0.  Underlying CKD likely related to hypertension, DM, came in with slightly worsened creatinine in context of acute on chronic systolic CHF exacerbation, diuresis, ATN, creatinine peaked at 3.24 on 05/27/2022, improving now, slight bump in creatinine to 2.94 today, nephrology managing diuretics.  Appreciate their help.  Dizziness/syncope, subacute CVA prior history of CVA MRI brain showed probable small subacute infarct of the inferior left basal ganglia.  Chronic infarcts and chronic microvascular ischemic changes.  Patient declined MRA. -Carotid Dopplers unremarkable -2D echo showed EF 50 to 55%, grade 2 DD, no shunt. -LDL 52, hemoglobin A1c 9.0. -Seen by neurology.  Continue eliquis per their recommendations.   Cholecystitis, chronic -Diagnosed  with acute cholecystitis in 02/2021, underwent  cholecystostomy tube placement on 03/19/2021. Tube output had decreased admission, improved with flushing with radiology.  Cholecystostomy tube was replaced by IR on 05/31/2022.   Uncontrolled type 2 diabetes mellitus with hyperglycemia, with long-term current use of insulin (HCC) -Hemoglobin A1c 9.0.  Currently on Semglee 15 units, NovoLog 3 units 3 times daily premeals and SSI, blood sugar fairly controlled.   Paroxysmal atrial fibrillation (HCC) -Rate controlled, continue Coreg, -Continue eliquis   chronic anemia, is a Jehovah's witness, would not want transfusion; no active bleeding noted    Hypertension -Very well controlled, continue Coreg, Imdur, hydralazine   Rash on the back - ?  Contact dermatitis, placed on hydrocortisone ointment   Obesity Estimated body mass index is 31.78 kg/m as calculated from the following:   Height as of this encounter: '5\' 6"'$  (1.676 m).   Weight as of this encounter: 89.3 kg. Weight loss counseled.  Orthostatic tremors?:  I was notified by PT today that Pt continues to be limited by "episodes" when mobilizing. Pt immediately has shaking of trunk, UEs and LEs with rolling, side to sit, any movement.  Pt falls back and gets a glazed look on her face.  I personally examined the patient.  When we tried to set her up with the help of the nurse, patient would have trunk and upper extremity as well as head and neck tremors and she will fall right back, she will stare as if she is postictal and then she will become normal within a matter of few seconds.  Discussed with neurology, this type of tremors are unlikely to be seizure or vestibular.  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  Unfortunately, there is not enough time between patient sitting up and starting to tremor and following back that her vitals could be checked.  I have sent a message to nephrology about this so they can consider possibly reducing the dose of Lasix in order to prevent further diuresis.  I  will defer to nephrology.  DVT prophylaxis:   Eliquis   Code Status: Full Code  Family Communication:  None present at bedside.  Plan of care discussed with patient in length and he/she verbalized understanding and agreed with it.  Status is: Inpatient Remains inpatient appropriate because: Needs further diuresis per nephrology.  Eventual plan to discharge to SNF once cleared by nephrology.   Estimated body mass index is 28.32 kg/m as calculated from the following:   Height as of this encounter: '5\' 6"'$  (1.676 m).   Weight as of this encounter: 79.6 kg.    Nutritional Assessment: Body mass index is 28.32 kg/m.Marland Kitchen Seen by dietician.  I agree with the assessment and plan as outlined below: Nutrition Status: Nutrition Problem: Increased nutrient needs Etiology: chronic illness (CHF) Signs/Symptoms: estimated needs Interventions: MVI, Glucerna shake  . Skin Assessment: I have examined the patient's skin and I agree with the wound assessment as performed by the wound care RN as outlined below:    Consultants:  Nephrology Neurology-signed off Cardiology  Procedures:  None  Antimicrobials:  Anti-infectives (From admission, onward)    Start     Dose/Rate Route Frequency Ordered Stop   05/29/22 1345  cephALEXin (KEFLEX) capsule 500 mg        500 mg Oral Every 12 hours 05/29/22 1258 06/01/22 0959   05/27/22 1400  cefTRIAXone (ROCEPHIN) 1 g in sodium chloride 0.9 % 100 mL IVPB  Status:  Discontinued  1 g 200 mL/hr over 30 Minutes Intravenous Every 24 hours 05/27/22 1256 05/29/22 1258         Subjective:  Patient seen and examined.  She continues to complain of generalized weakness.  No other complaint.  Objective: Vitals:   06/02/22 0216 06/02/22 0319 06/02/22 0558 06/02/22 1100  BP:  (!) 125/52 (!) 131/49 (!) 110/53  Pulse:  62 80 60  Resp:  '13 18 16  '$ Temp:  98 F (36.7 C) 98 F (36.7 C) 97.9 F (36.6 C)  TempSrc:  Oral Oral Oral  SpO2:  95% 93% 95%   Weight: 79.6 kg     Height:        Intake/Output Summary (Last 24 hours) at 06/02/2022 1425 Last data filed at 06/02/2022 1412 Gross per 24 hour  Intake 976.23 ml  Output 2600 ml  Net -1623.77 ml    Filed Weights   05/31/22 0352 06/01/22 0358 06/02/22 0216  Weight: 85.5 kg 79.6 kg 79.6 kg    Examination:  General exam: Appears calm and comfortable  Respiratory system: Clear to auscultation. Respiratory effort normal. Cardiovascular system: S1 & S2 heard, RRR. No JVD, murmurs, rubs, gallops or clicks. No pedal edema. Gastrointestinal system: Abdomen is nondistended, soft and nontender. No organomegaly or masses felt. Normal bowel sounds heard. Central nervous system: Alert and oriented. No focal neurological deficits. Extremities: Symmetric 5 x 5 power. Skin: No rashes, lesions or ulcers.  Psychiatry: Judgement and insight appear normal. Mood & affect appropriate.   Data Reviewed: I have personally reviewed following labs and imaging studies  CBC: Recent Labs  Lab 05/27/22 0402  WBC 6.7  NEUTROABS 4.4  HGB 10.2*  HCT 32.4*  MCV 89.8  PLT 951    Basic Metabolic Panel: Recent Labs  Lab 05/27/22 0402 05/28/22 0426 05/29/22 0356 05/30/22 0453 05/31/22 0630 06/01/22 0442 06/02/22 0402  NA 134*   < > 138 137 135 138 137  K 5.1   < > 3.7 3.6 3.2* 3.8 3.6  CL 101   < > 98 98 96* 97* 93*  CO2 26   < > '29 27 31 '$ 33* 31  GLUCOSE 195*   < > 111* 153* 153* 89 84  BUN 43*   < > 51* 53* 48* 48* 48*  CREATININE 3.24*   < > 3.07* 2.94* 2.76* 2.94* 3.01*  CALCIUM 8.1*   < > 8.3* 8.1* 8.2* 8.4* 8.4*  MG 2.9*  --   --  2.7*  --   --   --   PHOS 6.3*   < > 5.4* 5.0* 5.0* 4.4 4.8*   < > = values in this interval not displayed.    GFR: Estimated Creatinine Clearance: 18.5 mL/min (A) (by C-G formula based on SCr of 3.01 mg/dL (H)). Liver Function Tests: Recent Labs  Lab 05/29/22 0356 05/30/22 0453 05/31/22 0630 06/01/22 0442 06/02/22 0402  ALBUMIN 2.2* 2.3* 2.3* 2.3*  2.3*    No results for input(s): LIPASE, AMYLASE in the last 168 hours. No results for input(s): AMMONIA in the last 168 hours. Coagulation Profile: No results for input(s): INR, PROTIME in the last 168 hours. Cardiac Enzymes: No results for input(s): CKTOTAL, CKMB, CKMBINDEX, TROPONINI in the last 168 hours. BNP (last 3 results) Recent Labs    05/04/22 1625  PROBNP 15,044*    HbA1C: No results for input(s): HGBA1C in the last 72 hours. CBG: Recent Labs  Lab 06/01/22 1129 06/01/22 1520 06/01/22 2121 06/02/22 0612 06/02/22 1116  GLUCAP  148* 109* 141* 93 133*    Lipid Profile: No results for input(s): CHOL, HDL, LDLCALC, TRIG, CHOLHDL, LDLDIRECT in the last 72 hours. Thyroid Function Tests: No results for input(s): TSH, T4TOTAL, FREET4, T3FREE, THYROIDAB in the last 72 hours. Anemia Panel: No results for input(s): VITAMINB12, FOLATE, FERRITIN, TIBC, IRON, RETICCTPCT in the last 72 hours.  Sepsis Labs: No results for input(s): PROCALCITON, LATICACIDVEN in the last 168 hours.  Recent Results (from the past 240 hour(s))  Urine Culture     Status: Abnormal   Collection Time: 05/26/22  6:12 AM   Specimen: Urine, Clean Catch  Result Value Ref Range Status   Specimen Description URINE, CLEAN CATCH  Final   Special Requests   Final    NONE Performed at [facility], [address] [place], [place] [address]    Culture >=100,000 COLONIES/mL ESCHERICHIA COLI (A)  Final   Report Status 05/28/2022 FINAL  Final   Organism ID, Bacteria ESCHERICHIA COLI (A)  Final      Susceptibility   Escherichia coli - MIC*    AMPICILLIN >=32 RESISTANT Resistant     CEFAZOLIN <=4 SENSITIVE Sensitive     CEFEPIME <=0.12 SENSITIVE Sensitive     CEFTRIAXONE <=0.25 SENSITIVE Sensitive     CIPROFLOXACIN <=0.25 SENSITIVE Sensitive     GENTAMICIN <=1 SENSITIVE Sensitive     IMIPENEM <=0.25 SENSITIVE Sensitive     NITROFURANTOIN <=16 SENSITIVE Sensitive     TRIMETH/SULFA <=20 SENSITIVE  Sensitive     AMPICILLIN/SULBACTAM 4 SENSITIVE Sensitive     PIP/TAZO <=4 SENSITIVE Sensitive     * >=100,000 COLONIES/mL ESCHERICHIA COLI      Radiology Studies: No results found.  Scheduled Meds:  apixaban  5 mg Oral BID   carvedilol  6.25 mg Oral BID WC   docusate sodium  100 mg Oral BID   feeding supplement (GLUCERNA SHAKE)  237 mL Oral TID BM   hydrALAZINE  50 mg Oral Q8H   hydrocortisone cream   Topical BID   insulin aspart  0-15 Units Subcutaneous TID WC   insulin aspart  0-5 Units Subcutaneous QHS   insulin aspart  3 Units Subcutaneous TID WC   insulin glargine-yfgn  15 Units Subcutaneous Daily   isosorbide mononitrate  15 mg Oral Daily   multivitamin with minerals  1 tablet Oral Daily   sodium chloride flush  3 mL Intravenous Q12H   sodium chloride flush  5 mL Intracatheter Q8H   Continuous Infusions:  sodium chloride 5 mL/hr at 05/29/22 2126   furosemide 120 mg (06/02/22 0753)     LOS: 13 days   Darliss Cheney, MD [facility]  06/02/2022, 2:25 PM   *Please note that this is a verbal dictation therefore any spelling or grammatical errors are due to the "[place] [employer]" system interpretation.  Please page via [place] and do not message via secure chat for urgent patient care matters. Secure chat can be used for non urgent patient care matters.  How to contact the [facility] Attending or Consulting provider [place] or covering provider during after hours [place], for this patient?  Check the care team in [facility] and look for a) attending/consulting [facility] provider listed and b) the [facility] team listed. Page or secure chat [phone]. Log into [email or web address] and use [place]'s universal password to access. If you do not have the password, please contact the hospital operator. Locate the [facility] provider you are looking for under [facility] and page  to a number that you can be directly reached. If you still have difficulty reaching the provider, please page the [facility] (Director on  Call) for the Hospitalists listed on amion for assistance.

## 2022-06-02 NOTE — Progress Notes (Signed)
Patient ID: Tracey Bryant, female   DOB: Jul 16, 1951, 71 y.o.   MRN: [id number] S: Doesn't feel well today.  Good UOP. O:BP (!) 131/49 (BP Location: Left Arm)   Pulse 80   Temp 98 F (36.7 C) (Oral)   Resp 18   Ht '5\' 6"'$  (1.676 m)   Wt 79.6 kg   SpO2 93%   BMI 28.32 kg/m   Intake/Output Summary (Last 24 hours) at 06/02/2022 1027 Last data filed at 06/02/2022 0753 Gross per 24 hour  Intake 1028 ml  Output 1970 ml  Net -942 ml   Intake/Output: I/O last 3 completed shifts: In: 0347 [P.O.:1200; I.V.:69; Other:10; IV Piggyback:124] Out: 4259 [Urine:3200; Drains:880]  Intake/Output this shift:  Total I/O In: 241 [P.O.:236; Other:5] Out: 150 [Drains:150] Weight change: 0 kg Gen:ill-appearing CVS:RRR Resp:CTA Abd: +BS, soft, mildly tender, no guarding or rebound Ext: trace pretibial edema bilaterally  Recent Labs  Lab 05/27/22 0402 05/28/22 0426 05/29/22 0356 05/30/22 0453 05/31/22 0630 06/01/22 0442 06/02/22 0402  NA 134* 135 138 137 135 138 137  K 5.1 4.4 3.7 3.6 3.2* 3.8 3.6  CL 101 99 98 98 96* 97* 93*  CO2 '26 27 29 27 31 '$ 33* 31  GLUCOSE 195* 138* 111* 153* 153* 89 84  BUN 43* 47* 51* 53* 48* 48* 48*  CREATININE 3.24* 3.08* 3.07* 2.94* 2.76* 2.94* 3.01*  ALBUMIN 2.2* 2.2* 2.2* 2.3* 2.3* 2.3* 2.3*  CALCIUM 8.1* 8.1* 8.3* 8.1* 8.2* 8.4* 8.4*  PHOS 6.3* 6.1* 5.4* 5.0* 5.0* 4.4 4.8*   Liver Function Tests: Recent Labs  Lab 05/31/22 0630 06/01/22 0442 06/02/22 0402  ALBUMIN 2.3* 2.3* 2.3*   No results for input(s): LIPASE, AMYLASE in the last 168 hours. No results for input(s): AMMONIA in the last 168 hours. CBC: Recent Labs  Lab 05/27/22 0402  WBC 6.7  NEUTROABS 4.4  HGB 10.2*  HCT 32.4*  MCV 89.8  PLT 348   Cardiac Enzymes: No results for input(s): CKTOTAL, CKMB, CKMBINDEX, TROPONINI in the last 168 hours. CBG: Recent Labs  Lab 06/01/22 0559 06/01/22 1129 06/01/22 1520 06/01/22 2121 06/02/22 0612  GLUCAP 107* 148* 109* 141* 93    Iron  Studies: No results for input(s): IRON, TIBC, TRANSFERRIN, FERRITIN in the last 72 hours. Studies/Results: IR EXCHANGE BILIARY DRAIN  Result Date: 05/31/2022 INDICATION: Cholelithiasis, status post percutaneous cholecystostomy catheter placement 03/19/2021 Dr. Jasmine December: CHOLECYSTOSTOMY CATHETER EXCHANGE UNDER FLUOROSCOPY MEDICATIONS: No antibiotics were indicated ANESTHESIA/SEDATION: None required FLUOROSCOPY: Radiation Exposure Index (as provided by the fluoroscopic device): 19 mGy Kerma COMPLICATIONS: None immediate. PROCEDURE: Informed written consent was obtained from the patient after a thorough discussion of the procedural risks, benefits and alternatives. All questions were addressed. Maximal Sterile Barrier Technique was utilized including caps, mask, sterile gowns, sterile gloves, sterile drape, hand hygiene and skin antiseptic. A timeout was performed prior to the initiation of the procedure. Catheter injection demonstrated small mobile filling defects in the lumen of the nondistended gallbladder. Cystic duct is patent. Catheter was cut and exchanged over Amplatz wire for a new 14 French pigtail device, formed centrally within the gallbladder lumen. Contrast injection confirms good positioning. No extravasation. IMPRESSION: 1. Technically successful 30 French cholecystostomy catheter exchange under fluoroscopy. 2. Patent cystic duct is demonstrated. 3. Mobile filling defects in the gallbladder suggesting gallstones or sludge balls. Electronically Signed   By: Lucrezia Europe M.D.   On: 05/31/2022 15:59    apixaban  5 mg Oral BID   carvedilol  6.25 mg Oral BID WC  docusate sodium  100 mg Oral BID   feeding supplement (GLUCERNA SHAKE)  237 mL Oral TID BM   hydrALAZINE  50 mg Oral Q8H   hydrocortisone cream   Topical BID   insulin aspart  0-15 Units Subcutaneous TID WC   insulin aspart  0-5 Units Subcutaneous QHS   insulin aspart  3 Units Subcutaneous TID WC   insulin glargine-yfgn  15 Units Subcutaneous  Daily   isosorbide mononitrate  15 mg Oral Daily   multivitamin with minerals  1 tablet Oral Daily   sodium chloride flush  3 mL Intravenous Q12H   sodium chloride flush  5 mL Intracatheter Q8H    BMET    Component Value Date/Time   NA 137 06/02/2022 0402   NA 141 05/04/2022 1625   K 3.6 06/02/2022 0402   CL 93 (L) 06/02/2022 0402   CO2 31 06/02/2022 0402   GLUCOSE 84 06/02/2022 0402   BUN 48 (H) 06/02/2022 0402   BUN 18 05/04/2022 1625   CREATININE 3.01 (H) 06/02/2022 0402   CALCIUM 8.4 (L) 06/02/2022 0402   GFRNONAA 16 (L) 06/02/2022 0402   GFRAA >90 01/17/2013 1915   CBC    Component Value Date/Time   WBC 6.7 05/27/2022 0402   RBC 3.61 (L) 05/27/2022 0402   HGB 10.2 (L) 05/27/2022 0402   HCT 32.4 (L) 05/27/2022 0402   PLT 348 05/27/2022 0402   MCV 89.8 05/27/2022 0402   MCH 28.3 05/27/2022 0402   MCHC 31.5 05/27/2022 0402   RDW 15.5 05/27/2022 0402   LYMPHSABS 1.2 05/27/2022 0402   MONOABS 0.6 05/27/2022 0402   EOSABS 0.4 05/27/2022 0402   BASOSABS 0.0 05/27/2022 0402    Assessment/Plan:   AKI/CKD stage IV - likely hemodynamically mediated in setting of acute systolic CHF exacerbation.  Scr has been slowly improving back to her baseline and is now 2.76.  Good UOP with IV lasix and slight bump in Scr so decreased lasix from 120 mg tid to 120 mg bid on 6/6.  No indication for dialysis at this time.  Pt is a marginal candidate given her poor functional status. Avoid nephrotoxic medications including NSAIDs and iodinated intravenous contrast exposure unless the latter is absolutely indicated.   Preferred narcotic agents for pain control are hydromorphone, fentanyl, and methadone. Morphine should not be used. Avoid Baclofen and avoid oral sodium phosphate and magnesium citrate based laxatives / bowel preps.  Continue strict Input and Output monitoring. Will monitor the patient closely with you and intervene or adjust therapy as indicated by changes in clinical  status/labs Acute on chronic systolic CHF - diuresed well overnight.  Continue with current regimen.   Recurrent right pleural effusion - s/p thoracentesis on 5/24 and again 05/28/22 with improved breathing. Subacute stroke - presented with dizziness and seen by MRI on 05/26/22 to left inferior basal ganglia.  Neurology consulted. Chronic cholecystitis - per IR.  Donetta Potts, MD [facility]

## 2022-06-02 NOTE — Progress Notes (Signed)
Dressing changed to Rt side Billiary tube

## 2022-06-02 NOTE — Plan of Care (Signed)
  Problem: Education: Goal: Ability to demonstrate management of disease process will improve Outcome: Progressing   Problem: Activity: Goal: Capacity to carry out activities will improve Outcome: Progressing   Problem: Cardiac: Goal: Ability to achieve and maintain adequate cardiopulmonary perfusion will improve Outcome: Progressing   Problem: Health Behavior/Discharge Planning: Goal: Ability to manage health-related needs will improve Outcome: Progressing

## 2022-06-03 DIAGNOSIS — I5043 Acute on chronic combined systolic (congestive) and diastolic (congestive) heart failure: Secondary | ICD-10-CM | POA: Diagnosis not present

## 2022-06-03 LAB — RENAL FUNCTION PANEL
Albumin: 2.1 g/dL — ABNORMAL LOW (ref 3.5–5.0)
Anion gap: 8 (ref 5–15)
BUN: 46 mg/dL — ABNORMAL HIGH (ref 8–23)
CO2: 32 mmol/L (ref 22–32)
Calcium: 8.3 mg/dL — ABNORMAL LOW (ref 8.9–10.3)
Chloride: 95 mmol/L — ABNORMAL LOW (ref 98–111)
Creatinine, Ser: 3.11 mg/dL — ABNORMAL HIGH (ref 0.44–1.00)
GFR, Estimated: 16 mL/min — ABNORMAL LOW (ref >60)
Glucose, Bld: 124 mg/dL — ABNORMAL HIGH (ref 70–99)
Phosphorus: 5.3 mg/dL — ABNORMAL HIGH (ref 2.5–4.6)
Potassium: 4 mmol/L (ref 3.5–5.1)
Sodium: 135 mmol/L (ref 135–145)

## 2022-06-03 LAB — GLUCOSE, CAPILLARY
Glucose-Capillary: 150 mg/dL — ABNORMAL HIGH (ref 70–99)
Glucose-Capillary: 102 mg/dL — ABNORMAL HIGH (ref 70–99)
Glucose-Capillary: 117 mg/dL — ABNORMAL HIGH (ref 70–99)
Glucose-Capillary: 185 mg/dL — ABNORMAL HIGH (ref 70–99)
Glucose-Capillary: 123 mg/dL — ABNORMAL HIGH (ref 70–99)

## 2022-06-03 NOTE — Progress Notes (Signed)
Pt was received in the geri chair and wanted to go back to bed. However pt couldn't stand.  Pt was assisted to a siting position then suddenly pt had an "episode"- where she became completely flaccid and had a near syncopal event which lasted for approximately 3 seconds. She regain alertness 4X right after. No twitching noted. Attempted to use the hoyer lift to get her back to bed, however they where no appropriate pad for the lift. 4 people lifted her back to bed from the chair. No motor deficit and + full sensation  in all extremities. Will monitor.

## 2022-06-03 NOTE — Progress Notes (Signed)
PROGRESS NOTE    Tracey Bryant  KGM:[id number] DOB: 1951-08-12 DOA: 05/19/2022 PCP: Coolidge Breeze, FNP   Brief Narrative:  Tracey Bryant is a 71 y.o. female with PMH cholecystitis with cholecystostomy tube in place (initially placed 03/19/2021), afib, HTN, DMII, sCHF, CVA who presented with shortness of breath.  She also had reported increased lower extremity swelling/overload with no improvement on her home Lasix.   She underwent thoracentesis on 05/19/2022 removing 1.3 L.  Initially Lasix was held after developing some dizziness.Respiratory status improved after thoracentesis.     She also was evaluated by IR due to difficulty with cholecystostomy tube drainage.  Tube was evaluated and noted intact however suture had been removed but tube had not been retracted any.  After forceful flushing, biliary output was achieved with no further issues.   Due to ongoing dizziness, MRI brain was obtained on 5/30 positive for subacute infarct.  Neurology consulted.   6/2: repeat thoracentesis with IR, 900 cc drained, symptomatic improvement     Assessment & Plan:   Principal Problem:   Acute on chronic combined systolic and diastolic congestive heart failure (HCC) Active Problems:   Acute renal failure superimposed on stage 4 chronic kidney disease (HCC)   Dizziness   Cholecystitis, chronic   Acute respiratory failure with hypoxia (HCC)   Physical deconditioning   Hypertension   Uncontrolled type 2 diabetes mellitus with hyperglycemia, with long-term current use of insulin (HCC)   Paroxysmal atrial fibrillation (HCC)   History of CVA (cerebrovascular accident)  Acute on chronic combined systolic and diastolic congestive heart failure (HCC)/Acute respiratory failure with hypoxia: Underwent thoracentesis on 5/24 with removal of 1.3 L with symptomatic improvement. Initially Lasix held due to worsening renal function, dizziness however continued to have volume overload with persisting lower  extremity edema.  Lasix resumed.  She underwent repeat thoracentesis on 6/2 and removal of 900 cc fluid with notable symptomatic improvement again.  EF on most recent echo notably 50-55%, grade II diastolic heart failure. Suspected pt gained fluid over a significant amount of time.  -Cardiology signed off, nephrology following, appreciate recommendations, negative balance of 12.5 L.  Patient was noted to have possibly orthostatic tremors on the morning of 06/02/2022.  Discussed with nephrology, she clinically also appears dry, Lasix was stopped after she received a.m. dose.  Continue following medications. -coreg 6.25 mg BID -hydral 50 q8h, imdur 15 qD -holding sglt2i   Moderate to large right-sided pleural effusion Initial thoracentesis on 5/24. CXR on 5/31 showed moderate right and small left pleural effusion, ordered right-sided ultrasound guided thoracentesis, completed 6/2.    E. coli UTI Urine culture showed more than 100,000 colonies of E. Coli, resistant to ampicillin.  -IV Rocephin 6/1-6/2, transitioned to PO keflex 500 mg BID, last dose on 06/01/2022.   Acute kidney injury superimposed on stage 3b-4 CKD (HCC) Baseline creatinine around 2.0.  Underlying CKD likely related to hypertension, DM, came in with slightly worsened creatinine in context of acute on chronic systolic CHF exacerbation, diuresis, ATN, creatinine peaked at 3.24 on 05/27/2022, then improved but now climbing up again and it is 3.11 today.  Lasix on hold.  Nephrology on board and further management to them.    Dizziness/syncope, subacute CVA prior history of CVA MRI brain showed probable small subacute infarct of the inferior left basal ganglia.  Chronic infarcts and chronic microvascular ischemic changes.  Patient declined MRA. -Carotid Dopplers unremarkable -2D echo showed EF 50 to 55%, grade 2 DD, no shunt. -  LDL 52, hemoglobin A1c 9.0. -Seen by neurology.  Continue eliquis per their recommendations.   Cholecystitis,  chronic -Diagnosed with acute cholecystitis in 02/2021, underwent cholecystostomy tube placement on 03/19/2021. Tube output had decreased admission, improved with flushing with radiology.  Cholecystostomy tube was replaced by IR on 05/31/2022.   Uncontrolled type 2 diabetes mellitus with hyperglycemia, with long-term current use of insulin (HCC) -Hemoglobin A1c 9.0.  Currently on Semglee 15 units, NovoLog 3 units 3 times daily premeals and SSI, blood sugar fairly controlled.   Paroxysmal atrial fibrillation (HCC) -Rate controlled, continue Coreg, -Continue eliquis   chronic anemia, is a Jehovah's witness, would not want transfusion; no active bleeding noted    Hypertension -Very well controlled, continue Coreg, Imdur, hydralazine   Rash on the back - ?  Contact dermatitis, placed on hydrocortisone ointment   Obesity Estimated body mass index is 31.78 kg/m as calculated from the following:   Height as of this encounter: '5\' 6"'$  (1.676 m).   Weight as of this encounter: 89.3 kg. Weight loss counseled.  Orthostatic tremors?:  On the morning of 06/02/2022, I was notified by PT today that Pt continues to be limited by "episodes" when mobilizing. Pt immediately has shaking of trunk, UEs and LEs with rolling, side to sit, any movement.  Pt falls back and gets a glazed look on her face.  I personally examined the patient.  When we tried to set her up with the help of the nurse, patient would have trunk and upper extremity as well as head and neck tremors and she will fall right back, she will stare as if she is postictal and then she will become normal within a matter of few seconds.  Discussed with neurology, this type of tremors are unlikely to be seizure or vestibular.  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  Unfortunately, there is not enough time between patient sitting up and starting to tremor and following back that her vitals could be checked.  She clinically appears dry.  After discussion with  nephrology, we decided to discontinue Lasix.  Patient still has similar tremors even today.  We will continue to hold Lasix.  DVT prophylaxis:   Eliquis   Code Status: Full Code  Family Communication:  None present at bedside.  Plan of care discussed with patient in length and he/she verbalized understanding and agreed with it.  Status is: Inpatient Remains inpatient appropriate because: Needs further diuresis per nephrology.  Eventual plan to discharge to SNF once cleared by nephrology.   Estimated body mass index is 28.47 kg/m as calculated from the following:   Height as of this encounter: '5\' 6"'$  (1.676 m).   Weight as of this encounter: 80 kg.    Nutritional Assessment: Body mass index is 28.47 kg/m.Marland Kitchen Seen by dietician.  I agree with the assessment and plan as outlined below: Nutrition Status: Nutrition Problem: Increased nutrient needs Etiology: chronic illness (CHF) Signs/Symptoms: estimated needs Interventions: MVI, Glucerna shake  . Skin Assessment: I have examined the patient's skin and I agree with the wound assessment as performed by the wound care RN as outlined below:    Consultants:  Nephrology Neurology-signed off Cardiology  Procedures:  None  Antimicrobials:  Anti-infectives (From admission, onward)    Start     Dose/Rate Route Frequency Ordered Stop   05/29/22 1345  cephALEXin (KEFLEX) capsule 500 mg        500 mg Oral Every 12 hours 05/29/22 1258 06/01/22 0959   05/27/22 1400  cefTRIAXone (ROCEPHIN) 1 g in sodium chloride 0.9 % 100 mL IVPB  Status:  Discontinued        1 g 200 mL/hr over 30 Minutes Intravenous Every 24 hours 05/27/22 1256 05/29/22 1258         Subjective:  Seen and examined.  She has no complaints.  She stated that she was feeling better today.  Objective: Vitals:   06/03/22 0541 06/03/22 0829 06/03/22 0831 06/03/22 1036  BP: (!) 138/57  (!) 121/53 (!) 116/52  Pulse:  70 71 (!) 59  Resp: '18  16 20  '$ Temp: 98.1 F (36.7  C)   98.3 F (36.8 C)  TempSrc: Oral   Oral  SpO2: 93%  94% 95%  Weight: 80 kg     Height:        Intake/Output Summary (Last 24 hours) at 06/03/2022 1239 Last data filed at 06/03/2022 0829 Gross per 24 hour  Intake 832 ml  Output 1825 ml  Net -993 ml    Filed Weights   06/02/22 0216 06/03/22 0321 06/03/22 0541  Weight: 79.6 kg 80 kg 80 kg    Examination:  General exam: Appears calm and comfortable, obese Respiratory system: Clear to auscultation. Respiratory effort normal. Cardiovascular system: S1 & S2 heard, RRR. No JVD, murmurs, rubs, gallops or clicks. No pedal edema. Gastrointestinal system: Abdomen is nondistended, soft and nontender. No organomegaly or masses felt. Normal bowel sounds heard. Central nervous system: Alert and oriented. No focal neurological deficits. Extremities: Symmetric 5 x 5 power. Skin: No rashes, lesions or ulcers.  Psychiatry: Judgement and insight appear normal. Mood & affect appropriate.   Data Reviewed: I have personally reviewed following labs and imaging studies  CBC: No results for input(s): "WBC", "NEUTROABS", "HGB", "HCT", "MCV", "PLT" in the last 168 hours.  Basic Metabolic Panel: Recent Labs  Lab 05/30/22 0453 05/31/22 0630 06/01/22 0442 06/02/22 0402 06/03/22 0315  NA 137 135 138 137 135  K 3.6 3.2* 3.8 3.6 4.0  CL 98 96* 97* 93* 95*  CO2 27 31 33* 31 32  GLUCOSE 153* 153* 89 84 124*  BUN 53* 48* 48* 48* 46*  CREATININE 2.94* 2.76* 2.94* 3.01* 3.11*  CALCIUM 8.1* 8.2* 8.4* 8.4* 8.3*  MG 2.7*  --   --   --   --   PHOS 5.0* 5.0* 4.4 4.8* 5.3*    GFR: Estimated Creatinine Clearance: 18 mL/min (A) (by C-G formula based on SCr of 3.11 mg/dL (H)). Liver Function Tests: Recent Labs  Lab 05/30/22 0453 05/31/22 0630 06/01/22 0442 06/02/22 0402 06/03/22 0315  ALBUMIN 2.3* 2.3* 2.3* 2.3* 2.1*    No results for input(s): "LIPASE", "AMYLASE" in the last 168 hours. No results for input(s): "AMMONIA" in the last 168  hours. Coagulation Profile: No results for input(s): "INR", "PROTIME" in the last 168 hours. Cardiac Enzymes: No results for input(s): "CKTOTAL", "CKMB", "CKMBINDEX", "TROPONINI" in the last 168 hours. BNP (last 3 results) Recent Labs    05/04/22 1625  PROBNP 15,044*    HbA1C: No results for input(s): "HGBA1C" in the last 72 hours. CBG: Recent Labs  Lab 06/02/22 1601 06/02/22 2115 06/03/22 0116 06/03/22 0657 06/03/22 1039  GLUCAP 147* 175* 150* 102* 117*    Lipid Profile: No results for input(s): "CHOL", "HDL", "LDLCALC", "TRIG", "CHOLHDL", "LDLDIRECT" in the last 72 hours. Thyroid Function Tests: No results for input(s): "TSH", "T4TOTAL", "FREET4", "T3FREE", "THYROIDAB" in the last 72 hours. Anemia Panel: No results for input(s): "VITAMINB12", "FOLATE", "FERRITIN", "TIBC", "IRON", "  RETICCTPCT" in the last 72 hours.  Sepsis Labs: No results for input(s): "PROCALCITON", "LATICACIDVEN" in the last 168 hours.  Recent Results (from the past 240 hour(s))  Urine Culture     Status: Abnormal   Collection Time: 05/26/22  6:12 AM   Specimen: Urine, Clean Catch  Result Value Ref Range Status   Specimen Description URINE, CLEAN CATCH  Final   Special Requests   Final    NONE Performed at [facility], [address], [place], [place] [address]    Culture >=100,000 COLONIES/mL ESCHERICHIA COLI (A)  Final   Report Status 05/28/2022 FINAL  Final   Organism ID, Bacteria ESCHERICHIA COLI (A)  Final      Susceptibility   Escherichia coli - MIC*    AMPICILLIN >=32 RESISTANT Resistant     CEFAZOLIN <=4 SENSITIVE Sensitive     CEFEPIME <=0.12 SENSITIVE Sensitive     CEFTRIAXONE <=0.25 SENSITIVE Sensitive     CIPROFLOXACIN <=0.25 SENSITIVE Sensitive     GENTAMICIN <=1 SENSITIVE Sensitive     IMIPENEM <=0.25 SENSITIVE Sensitive     NITROFURANTOIN <=16 SENSITIVE Sensitive     TRIMETH/SULFA <=20 SENSITIVE Sensitive     AMPICILLIN/SULBACTAM 4 SENSITIVE Sensitive     PIP/TAZO  <=4 SENSITIVE Sensitive     * >=100,000 COLONIES/mL ESCHERICHIA COLI      Radiology Studies: No results found.  Scheduled Meds:  apixaban  5 mg Oral BID   carvedilol  6.25 mg Oral BID WC   docusate sodium  100 mg Oral BID   feeding supplement (GLUCERNA SHAKE)  237 mL Oral TID BM   hydrALAZINE  50 mg Oral Q8H   hydrocortisone cream   Topical BID   insulin aspart  0-15 Units Subcutaneous TID WC   insulin aspart  0-5 Units Subcutaneous QHS   insulin aspart  3 Units Subcutaneous TID WC   insulin glargine-yfgn  15 Units Subcutaneous Daily   isosorbide mononitrate  15 mg Oral Daily   multivitamin with minerals  1 tablet Oral Daily   sodium chloride flush  3 mL Intravenous Q12H   sodium chloride flush  5 mL Intracatheter Q8H   Continuous Infusions:  sodium chloride 5 mL/hr at 05/29/22 2126     LOS: 14 days   Darliss Cheney, MD [facility]  06/03/2022, 12:39 PM   *Please note that this is a verbal dictation therefore any spelling or grammatical errors are due to the "[employer]" system interpretation.  Please page via [place] and do not message via secure chat for urgent patient care matters. Secure chat can be used for non urgent patient care matters.  How to contact the [facility] Attending or Consulting provider [place] or covering provider during after hours [place], for this patient?  Check the care team in [facility] and look for a) attending/consulting [facility] provider listed and b) the [facility] team listed. Page or secure chat [phone]. Log into [email or web address] and use [place]'s universal password to access. If you do not have the password, please contact the hospital operator. Locate the [facility] provider you are looking for under [facility] and page to a number that you can be directly reached. If you still have difficulty reaching the provider, please page the [facility] (Director on Call) for the Hospitalists listed on amion for assistance.

## 2022-06-03 NOTE — Progress Notes (Signed)
Occupational Therapy Treatment Patient Details Name: Tracey Bryant MRN: [id number] DOB: [birth year] Today's Date: 06/03/2022   History of present illness The pt is a 71 yo female presenting from home 5/24 with SOB and BLE swelling. Found to have acute CHF exacerbation and R pleural effusion s/p thoracentesis 5/24.  Noted that pt was found to have subacute infarct left basal ganglia on 5/31 and Neuro was consulted.Underwent right-sided ultrasound guided thoracentesis on 6/2 and had R cholecystosomy catheter exchange performed in IR on 6/5.  PMH includes: arthritis, CHF, DM II, DVT, HTN, afib, and stroke.   OT comments  Patient seen and goals updated.  Level of assist increased from Mod I to supervision due to her "episodes".  Patient with uncontrolled movements, closing eyes, and falling back to supine times two.  Patient held onto OT the third time, and she was squat pivoted to recliner for supper.  Once in supported sitting, she does have the ability to perform grooming and self feeding.  OT can continue efforts with SNF recommended.  The episodes are unique, and this OT questions their legitimacy.  Patient should probably be co-treat for therapist safety.  During the episodes this OT just stood there, and waited for her to "regain" LOA.  Patient noted opening eyes slightly, and talking to OT during the episode.  Patient is stating that the MD told her she got dehydrated.     Recommendations for follow up therapy are one component of a multi-disciplinary discharge planning process, led by the attending physician.  Recommendations may be updated based on patient status, additional functional criteria and insurance authorization.    Follow Up Recommendations  Skilled nursing-short term rehab (<3 hours/day)    Assistance Recommended at Discharge Frequent or constant Supervision/Assistance  Patient can return home with the following  Assistance with cooking/housework;Help with stairs or ramp for  entrance;A lot of help with walking and/or transfers;A lot of help with bathing/dressing/bathroom;Assist for transportation;Direct supervision/assist for financial management;Direct supervision/assist for medications management   Equipment Recommendations       Recommendations for Other Services      Precautions / Restrictions Precautions Precautions: Fall Precaution Comments: psuedo-syncopal episodes with supine to sit (hold onto her so she doesn't fall over) Restrictions Weight Bearing Restrictions: No  Drain to R flank      Mobility Bed Mobility Overal bed mobility: Needs Assistance Bed Mobility: Supine to Sit     Supine to sit: Mod assist          Transfers Overall transfer level: Needs assistance Equipment used: 1 person hand held assist Transfers: Bed to chair/wheelchair/BSC   Stand pivot transfers: Total assist               Balance Overall balance assessment: Needs assistance Sitting-balance support: Feet supported Sitting balance-Leahy Scale: Poor Sitting balance - Comments: patient demonstrated unsteady sitting following episode   Standing balance support: Bilateral upper extremity supported Standing balance-Leahy Scale: Zero                             ADL either performed or assessed with clinical judgement   ADL   Eating/Feeding: Set up;Sitting Eating/Feeding Details (indicate cue type and reason): stating she could not see food Grooming: Wash/dry hands;Supervision/safety;Sitting Grooming Details (indicate cue type and reason): in recliner Upper Body Bathing: Supervision/ safety;Bed level   Lower Body Bathing: Moderate assistance;Bed level   Upper Body Dressing : Set up;Bed level   Lower Body  Dressing: Moderate assistance;Bed level                      Extremity/Trunk Assessment Upper Extremity Assessment Upper Extremity Assessment: Generalized weakness   Lower Extremity Assessment Lower Extremity Assessment:  Defer to PT evaluation        Vision Patient Visual Report: No change from baseline     Perception     Praxis      Cognition Arousal/Alertness: Awake/alert Behavior During Therapy: WFL for tasks assessed/performed Overall Cognitive Status: Impaired/Different from baseline                                          Exercises      Shoulder Instructions       General Comments      Pertinent Vitals/ Pain       Pain Assessment Pain Assessment: No/denies pain Pain Intervention(s): Monitored during session                                                          Frequency  Min 2X/week        Progress Toward Goals  OT Goals(current goals can now be found in the care plan section)  Progress towards OT goals: Not progressing toward goals - comment;Goals drowngraded-see care plan  Acute Rehab OT Goals OT Goal Formulation: With patient Time For Goal Achievement: 06/17/22 Potential to Achieve Goals: Fair ADL Goals Pt Will Perform Lower Body Dressing: with supervision;sitting/lateral leans Pt Will Transfer to Toilet: with supervision;ambulating;bedside commode Pt Will Perform Toileting - Clothing Manipulation and hygiene: with supervision;sitting/lateral leans  Plan Discharge plan remains appropriate    Co-evaluation                 AM-PAC OT "6 Clicks" Daily Activity     Outcome Measure   Help from another person eating meals?: A Little Help from another person taking care of personal grooming?: A Little Help from another person toileting, which includes using toliet, bedpan, or urinal?: A Lot Help from another person bathing (including washing, rinsing, drying)?: A Lot Help from another person to put on and taking off regular upper body clothing?: A Little Help from another person to put on and taking off regular lower body clothing?: A Lot 6 Click Score: 15    End of Session Equipment Utilized During  Treatment: Gait belt  OT Visit Diagnosis: Unsteadiness on feet (R26.81);Muscle weakness (generalized) (M62.81)   Activity Tolerance Other (comment)   Patient Left in chair;with call bell/phone within reach;with chair alarm set   Nurse Communication Mobility status        Time: 9604-5409 OT Time Calculation (min): 19 min  Charges: OT General Charges $OT Visit: 1 Visit OT Treatments $Therapeutic Activity: 8-22 mins  06/03/2022  RP, OTR/L  Acute Rehabilitation Services  Office:  [phone]   Metta Clines 06/03/2022, 5:31 PM

## 2022-06-03 NOTE — Progress Notes (Signed)
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    Patient was recently admitted 12/25/21 - 12/27/21 for CHF exacerbation - diuresed with Lasix IV 40 mg BID with total of -4.58 L volume removed.  Echo in December 2022 showed improvement in LVEF 40-45% (25-30% in 02/2021) with global hypokinesis, normal RV, and moderately elevated pulmonary pressures.  Discharge weight was 162 lbs.  Discharged on carvedilol 6.25 mg BID and furosemide 40 mg daily and was instructed to stop amlodipine 10 mg daily and BiDIl 20-37.5 mg TID.   On 05/24, she presented to her Cardiology office thinking she had an appointment and endorsed SOB, bilateral LEE, and reduced activity tolerance; MD referred her to [facility] ED.  Patient endorsed progressive symptoms despite recent increase Lasix 40 mg daily > BID at cardiology visit 05/09 where she reported worsening LEE, SOB, orthopnea, and PND.  CXR in the ED showed moderate R pleural effusion; dual PPM evident.  Repeat CXR 6h later demonstrated improvement in R pleural effusion, cardiomegaly and no evidence of interstitial edema.  Underwent R thoracentesis with 1.3 L removed.  She was started on IV diuresis 05/24, received Lasix IV 40 mg x1 and 60 mg x2 with marginal response , but her 1+ bilateral LE pitting edema was improved.    Breathing had improved on 05/25 s/p thoracentesis and diuresis, but renal function worsened (SCr 2.13 > 2.37, baseline ~1.6) and patient reported dizziness so diuretics were held.  Echo on 05/25 showed improvement in LVEF to 50-55% and elevated LVEDP (21) with new G2DD; still with normal RV and mildly elevated pulmonary pressures.    Patient continued to endorse SOB, feeling  swollen and fatigued w/ bilateral 2+ pitting LEE and JVD over the weekend (05/27-28).  She had minimal response of 550cc to Lasix IV 40 mg BID and was given 80 mg x1; nephrology also consulted and redosed 80 mg x1 the following day after ~1.4 L uop and worsening renal function (SCr 3.0 > 3.19).    She had a near syncopal event on 05/30 while sitting on the bed to perform orthostatics, was SOB while speaking and appeared more volume overloaded on exam.  JVD and bilateral 2+ pitting edema still present - nephrology increased Lasix to 80 mg IV x2.  She was taken for a MRI of brain 05/30 which revealed a probable small subacute infarct.    Repeat CXR 5/31 showed stable cardiomegaly with moderate right and small left pleural effusions.  Possible plans for HD if she does not make an adequate amount of urine with aggressive diuresis.  S/p thoracentesis on 6/2 with 900 cc of fluid removed.  Current HF Medications: Beta Blocker: carvedilol 6.25 mg BID Other: hydralazine 50 mg q8h + Imdur 15 mg daily  Prior to admission HF Medications: Diuretic: furosemide 40 mg BID Beta blocker: carvedilol 6.25 mg BID Other: hydralazine 25 mg BID + Imdur 15 mg daily; potassium 20 mEq BID  Pertinent Lab Values: Serum creatinine 3.11, BUN 46, Potassium 4.0, Sodium 135, BNP 2083.9, Magnesium 2.7, Ac 9.1% (up from 7.7% in Jan) TSAT 8, no ferritin level  Vital Signs: Weight: 176 lbs (admission weight: 201 lbs) Blood pressure: 130/60s Heart rate: 60s I/O: -1.5L yesterday; net -13.3L  Medication Assistance / Insurance Benefits Check: Does the patient have prescription insurance?  Yes Type of insurance plan: [name] [employer]  Does the patient qualify for medication assistance through manufacturers or grants?   Yes Eligible grants and/or patient assistance programs: pending initiation of ARNI/SGLT2i Medication assistance applications in progress: pending  Medication assistance applications approved:  pending  Outpatient Pharmacy:  Prior to admission outpatient pharmacy: [facility] Is the patient willing to use [facility] at discharge? Pending Is the patient willing to transition their outpatient pharmacy to utilize a [facility] outpatient pharmacy?   Pending    Assessment: 1. 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 NYHA class III symptoms. No JVD or SOB and minimal LEE on exam.   GDMT initiation/titration limited by poor renal function.  - Off IV lasix. Up 1 lb from yesterday. Watch carefully. - Continue PTA carvedilol 6.25 mg BID - No ACEi/ARB/ARNI or MRA with significant renal dysfunction  - No SGLT2i with h/o UTI during March-April 2022 admission, A1c up 7.7% > 9.1% since Jan, and eGFR <20. UA this admission with many bacteria, nitrite negative. UCx with >100k ecoli - treating with ceftriaxone - Continue PTA hydralazine 50 mg q8h + Imdur 15 mg daily    Plan: 1) Medication changes recommended at this time: - Stop PTA amlodipine on discharge to allow for BP room with GDMT initiation/titration pending renal function improvement  2) Patient assistance: Tracey Bryant = $47  3)  Education  - To be completed prior to discharge  Tracey Bryant, PharmD, BCPS Heart Failure Stewardship Pharmacist Phone [phone]  Please check [email or web address] for unit-specific pharmacist phone numbers

## 2022-06-03 NOTE — Progress Notes (Signed)
Pt had a seizure-like activity when the doctor and RN went to the pt's room and slowly moved the pt forward. Pt has a short involuntary movement and was alert after. RN and Nurse Tech gave the pt a bath, initially the pt did not have any seizure-like activity when she was moved but she repeat the same seizure-like activity after.

## 2022-06-03 NOTE — Progress Notes (Addendum)
Nutrition Follow-up  DOCUMENTATION CODES:   Not applicable  INTERVENTION:   Continue Glucerna Shake po TID, each supplement provides 220 kcal and 10 grams of protein   Continue MVI with minerals daily   Chopped meats with meal trays for ease of intake   Encourage PO intake.  NUTRITION DIAGNOSIS:   Increased nutrient needs related to chronic illness (CHF) as evidenced by estimated needs; ongoing  GOAL:   Patient will meet greater than or equal to 90% of their needs; progressing  MONITOR:   PO intake, Supplement acceptance, I & O's, Weight trends, Labs  REASON FOR ASSESSMENT:   Consult Other (Comment) ("nutritional goals")  ASSESSMENT:   71 year old female who presented to the ED on 5/24 with SOB. PMH of PAF, HTN, DM, CHF, CVA. Pt admitted with acute on chronic systolic CHF, R pleural effusion.  05/24 - s/p thoracentesis with 1300 ml fluid removed  Meal completion has been varied from 5-100% with 90% at lunch today. Pt currently has Glucerna shake ordered and has been consuming them. RD to continue with current orders to aid in caloric and protein needs. Continue chopped meats at trays. No indication for HD at this time.   Labs and medications reviewed.   Diet Order:   Diet Order             Diet heart healthy/carb modified Room service appropriate? Yes; Fluid consistency: Thin; Fluid restriction: 1500 mL Fluid  Diet effective now                   EDUCATION NEEDS:   Education needs have been addressed  Skin:  Skin Assessment: Reviewed RN Assessment  Last BM:  6/5  Height:   Ht Readings from Last 1 Encounters:  05/19/22 '5\' 6"'$  (1.676 m)    Weight:   Wt Readings from Last 1 Encounters:  06/03/22 80 kg   BMI:  Body mass index is 28.47 kg/m.  Estimated Nutritional Needs:   Kcal:  1700-1900  Protein:  85-100 grams  Fluid:  1.8 L  Corrin Parker, MS, RD, LDN RD pager number/after hours weekend pager number on Amion.

## 2022-06-03 NOTE — Progress Notes (Signed)
Patient ID: Tracey Bryant, female   DOB: October 21, 1951, 71 y.o.   MRN: [id number] S:Feeling better today.  No SOB. O:BP (!) 121/53 (BP Location: Left Arm)   Pulse 71   Temp 98.1 F (36.7 C) (Oral)   Resp 16   Ht '5\' 6"'$  (1.676 m)   Wt 80 kg   SpO2 94%   BMI 28.47 kg/m   Intake/Output Summary (Last 24 hours) at 06/03/2022 0936 Last data filed at 06/03/2022 0829 Gross per 24 hour  Intake 963.23 ml  Output 1825 ml  Net -861.77 ml   Intake/Output: I/O last 3 completed shifts: In: 1391.2 [P.O.:1180; I.V.:3; Other:15; IV Piggyback:193.2] Out: 2775 [Urine:2050; Drains:725]  Intake/Output this shift:  Total I/O In: 120 [P.O.:120] Out: 250 [Drains:250] Weight change: 0.4 kg Gen: NAD CVS: RRR Resp:CTA Abd: +BS, soft, NT/ND Ext: minimal presacral edema  Recent Labs  Lab 05/28/22 0426 05/29/22 0356 05/30/22 0453 05/31/22 0630 06/01/22 0442 06/02/22 0402 06/03/22 0315  NA 135 138 137 135 138 137 135  K 4.4 3.7 3.6 3.2* 3.8 3.6 4.0  CL 99 98 98 96* 97* 93* 95*  CO2 '27 29 27 31 '$ 33* 31 32  GLUCOSE 138* 111* 153* 153* 89 84 124*  BUN 47* 51* 53* 48* 48* 48* 46*  CREATININE 3.08* 3.07* 2.94* 2.76* 2.94* 3.01* 3.11*  ALBUMIN 2.2* 2.2* 2.3* 2.3* 2.3* 2.3* 2.1*  CALCIUM 8.1* 8.3* 8.1* 8.2* 8.4* 8.4* 8.3*  PHOS 6.1* 5.4* 5.0* 5.0* 4.4 4.8* 5.3*   Liver Function Tests: Recent Labs  Lab 06/01/22 0442 06/02/22 0402 06/03/22 0315  ALBUMIN 2.3* 2.3* 2.1*   No results for input(s): "LIPASE", "AMYLASE" in the last 168 hours. No results for input(s): "AMMONIA" in the last 168 hours. CBC: No results for input(s): "WBC", "NEUTROABS", "HGB", "HCT", "MCV", "PLT" in the last 168 hours. Cardiac Enzymes: No results for input(s): "CKTOTAL", "CKMB", "CKMBINDEX", "TROPONINI" in the last 168 hours. CBG: Recent Labs  Lab 06/02/22 1116 06/02/22 1601 06/02/22 2115 06/03/22 0116 06/03/22 0657  GLUCAP 133* 147* 175* 150* 102*    Iron Studies: No results for input(s): "IRON", "TIBC",  "TRANSFERRIN", "FERRITIN" in the last 72 hours. Studies/Results: No results found.  apixaban  5 mg Oral BID   carvedilol  6.25 mg Oral BID WC   docusate sodium  100 mg Oral BID   feeding supplement (GLUCERNA SHAKE)  237 mL Oral TID BM   hydrALAZINE  50 mg Oral Q8H   hydrocortisone cream   Topical BID   insulin aspart  0-15 Units Subcutaneous TID WC   insulin aspart  0-5 Units Subcutaneous QHS   insulin aspart  3 Units Subcutaneous TID WC   insulin glargine-yfgn  15 Units Subcutaneous Daily   isosorbide mononitrate  15 mg Oral Daily   multivitamin with minerals  1 tablet Oral Daily   sodium chloride flush  3 mL Intravenous Q12H   sodium chloride flush  5 mL Intracatheter Q8H    BMET    Component Value Date/Time   NA 135 06/03/2022 0315   NA 141 05/04/2022 1625   K 4.0 06/03/2022 0315   CL 95 (L) 06/03/2022 0315   CO2 32 06/03/2022 0315   GLUCOSE 124 (H) 06/03/2022 0315   BUN 46 (H) 06/03/2022 0315   BUN 18 05/04/2022 1625   CREATININE 3.11 (H) 06/03/2022 0315   CALCIUM 8.3 (L) 06/03/2022 0315   GFRNONAA 16 (L) 06/03/2022 0315   GFRAA >90 01/17/2013 1915   CBC    Component  Value Date/Time   WBC 6.7 05/27/2022 0402   RBC 3.61 (L) 05/27/2022 0402   HGB 10.2 (L) 05/27/2022 0402   HCT 32.4 (L) 05/27/2022 0402   PLT 348 05/27/2022 0402   MCV 89.8 05/27/2022 0402   MCH 28.3 05/27/2022 0402   MCHC 31.5 05/27/2022 0402   RDW 15.5 05/27/2022 0402   LYMPHSABS 1.2 05/27/2022 0402   MONOABS 0.6 05/27/2022 0402   EOSABS 0.4 05/27/2022 0402   BASOSABS 0.0 05/27/2022 0402    Assessment/Plan:   AKI/CKD stage IV - likely hemodynamically mediated in setting of acute systolic CHF exacerbation.  Scr had been slowly improving back to her baseline but then started climbing again and is now 3.11.  Lasix dose has been decreased and finally stopped 06/02/22 (but she did get am dose).    No indication for dialysis at this time.  Pt is a marginal candidate given her poor functional  status. Avoid nephrotoxic medications including NSAIDs and iodinated intravenous contrast exposure unless the latter is absolutely indicated.   Preferred narcotic agents for pain control are hydromorphone, fentanyl, and methadone. Morphine should not be used. Avoid Baclofen and avoid oral sodium phosphate and magnesium citrate based laxatives / bowel preps.  Continue strict Input and Output monitoring. Will monitor the patient closely with you and intervene or adjust therapy as indicated by changes in clinical status/labs Acute on chronic systolic CHF - diuresed well overnight.  Continue with current regimen.   Recurrent right pleural effusion - s/p thoracentesis on 5/24 and again 05/28/22 with improved breathing. Subacute stroke - presented with dizziness and seen by MRI on 05/26/22 to left inferior basal ganglia.  Neurology consulted. Orthostasis - lasix discontinued and continue to work with PT/OT Chronic cholecystitis - per IR.  Donetta Potts, MD [facility]

## 2022-06-04 DIAGNOSIS — I5043 Acute on chronic combined systolic (congestive) and diastolic (congestive) heart failure: Secondary | ICD-10-CM | POA: Diagnosis not present

## 2022-06-04 LAB — GLUCOSE, CAPILLARY
Glucose-Capillary: 114 mg/dL — ABNORMAL HIGH (ref 70–99)
Glucose-Capillary: 184 mg/dL — ABNORMAL HIGH (ref 70–99)
Glucose-Capillary: 132 mg/dL — ABNORMAL HIGH (ref 70–99)
Glucose-Capillary: 159 mg/dL — ABNORMAL HIGH (ref 70–99)

## 2022-06-04 LAB — RENAL FUNCTION PANEL
Albumin: 2.3 g/dL — ABNORMAL LOW (ref 3.5–5.0)
Anion gap: 14 (ref 5–15)
BUN: 53 mg/dL — ABNORMAL HIGH (ref 8–23)
CO2: 27 mmol/L (ref 22–32)
Calcium: 8.3 mg/dL — ABNORMAL LOW (ref 8.9–10.3)
Chloride: 93 mmol/L — ABNORMAL LOW (ref 98–111)
Creatinine, Ser: 2.98 mg/dL — ABNORMAL HIGH (ref 0.44–1.00)
GFR, Estimated: 16 mL/min — ABNORMAL LOW (ref >60)
Glucose, Bld: 124 mg/dL — ABNORMAL HIGH (ref 70–99)
Phosphorus: 4.9 mg/dL — ABNORMAL HIGH (ref 2.5–4.6)
Potassium: 4.2 mmol/L (ref 3.5–5.1)
Sodium: 134 mmol/L — ABNORMAL LOW (ref 135–145)

## 2022-06-04 LAB — CBC
HCT: 32.7 % — ABNORMAL LOW (ref 36.0–46.0)
Hemoglobin: 10.6 g/dL — ABNORMAL LOW (ref 12.0–15.0)
MCH: 28.6 pg (ref 26.0–34.0)
MCHC: 32.4 g/dL (ref 30.0–36.0)
MCV: 88.4 fL (ref 80.0–100.0)
Platelets: 389 10*3/uL (ref 150–400)
RBC: 3.7 MIL/uL — ABNORMAL LOW (ref 3.87–5.11)
RDW: 15.7 % — ABNORMAL HIGH (ref 11.5–15.5)
WBC: 8.3 10*3/uL (ref 4.0–10.5)
nRBC: 0 % (ref 0.0–0.2)

## 2022-06-04 MED ORDER — CARVEDILOL 3.125 MG PO TABS
3.1250 mg | ORAL_TABLET | Freq: Two times a day (BID) | ORAL | Status: DC
  Administered 2022-06-04 – 2022-06-09 (×10): 3.125 mg via ORAL
  Filled 2022-06-04 (×10): qty 1

## 2022-06-04 MED ORDER — MIDODRINE HCL 5 MG PO TABS
2.5000 mg | ORAL_TABLET | Freq: Three times a day (TID) | ORAL | Status: DC
  Administered 2022-06-04 – 2022-06-09 (×16): 2.5 mg via ORAL
  Filled 2022-06-04 (×16): qty 1

## 2022-06-04 NOTE — Progress Notes (Signed)
PROGRESS NOTE    Kiearra D Lapaglia  KVQ:[id number] DOB: [birth year] DOA: 05/19/2022 PCP: Coolidge Breeze, FNP   Brief Narrative:  Tracey Bryant is a 71 y.o. female with PMH cholecystitis with cholecystostomy tube in place (initially placed 03/19/2021), afib, HTN, DMII, sCHF, CVA who presented with shortness of breath.  She also had reported increased lower extremity swelling/overload with no improvement on her home Lasix.   She underwent thoracentesis on 05/19/2022 removing 1.3 L.  Initially Lasix was held after developing some dizziness.Respiratory status improved after thoracentesis.     She also was evaluated by IR due to difficulty with cholecystostomy tube drainage.  Tube was evaluated and noted intact however suture had been removed but tube had not been retracted any.  After forceful flushing, biliary output was achieved with no further issues.   Due to ongoing dizziness, MRI brain was obtained on 5/30 positive for subacute infarct.  Neurology consulted.   6/2: repeat thoracentesis with IR, 900 cc drained, symptomatic improvement     Assessment & Plan:   Principal Problem:   Acute on chronic combined systolic and diastolic congestive heart failure (HCC) Active Problems:   Acute renal failure superimposed on stage 4 chronic kidney disease (HCC)   Dizziness   Cholecystitis, chronic   Acute respiratory failure with hypoxia (HCC)   Physical deconditioning   Hypertension   Uncontrolled type 2 diabetes mellitus with hyperglycemia, with long-term current use of insulin (HCC)   Paroxysmal atrial fibrillation (HCC)   History of CVA (cerebrovascular accident)  Acute on chronic combined systolic and diastolic congestive heart failure (HCC)/Acute respiratory failure with hypoxia: Underwent thoracentesis on 5/24 with removal of 1.3 L with symptomatic improvement. Initially Lasix held due to worsening renal function, dizziness however continued to have volume overload with persisting lower  extremity edema.  Lasix resumed.  She underwent repeat thoracentesis on 6/2 and removal of 900 cc fluid with notable symptomatic improvement again.  EF on most recent echo notably 50-55%, grade II diastolic heart failure. Suspected pt gained fluid over a significant amount of time.  -Cardiology signed off, nephrology following, appreciate recommendations, negative balance of 13.6 L.  Patient was noted to have possibly orthostatic tremors on the morning of 06/02/2022.  Discussed with nephrology, she clinically also appeared dry, Lasix was stopped after she received a.m. dose that day.  Continue following medications. -coreg 6.25 mg BID -hydral 50 q8h, imdur 15 qD -holding sglt2i   Moderate to large right-sided pleural effusion Initial thoracentesis on 5/24. CXR on 5/31 showed moderate right and small left pleural effusion, ordered right-sided ultrasound guided thoracentesis, completed 6/2.    E. coli UTI Urine culture showed more than 100,000 colonies of E. Coli, resistant to ampicillin.  -IV Rocephin 6/1-6/2, transitioned to PO keflex 500 mg BID, last dose on 06/01/2022.   Acute kidney injury superimposed on stage 3b-4 CKD (HCC) Baseline creatinine around 2.0.  Underlying CKD likely related to hypertension, DM, came in with slightly worsened creatinine in context of acute on chronic systolic CHF exacerbation, diuresis, ATN, creatinine peaked at 3.24 on 05/27/2022, then improved but then started to climb up again, peaked again at 3.11 and better today.  Lasix on hold.  Nephrology on board and further management to them.    Dizziness/syncope, subacute CVA prior history of CVA MRI brain showed probable small subacute infarct of the inferior left basal ganglia.  Chronic infarcts and chronic microvascular ischemic changes.  Patient declined MRA. -Carotid Dopplers unremarkable -2D echo showed EF 50 to  55%, grade 2 DD, no shunt. -LDL 52, hemoglobin A1c 9.0. -Seen by neurology.  Continue eliquis per their  recommendations.   Cholecystitis, chronic -Diagnosed with acute cholecystitis in 02/2021, underwent cholecystostomy tube placement on 03/19/2021. Tube output had decreased admission, improved with flushing with radiology.  Cholecystostomy tube was replaced by IR on 05/31/2022.   Uncontrolled type 2 diabetes mellitus with hyperglycemia, with long-term current use of insulin (HCC) -Hemoglobin A1c 9.0.  Currently on Semglee 15 units, NovoLog 3 units 3 times daily premeals and SSI, blood sugar fairly controlled.   Paroxysmal atrial fibrillation (HCC) -Rate controlled, continue Coreg, -Continue eliquis   chronic anemia, is a Jehovah's witness, would not want transfusion; no active bleeding noted    Hypertension -Very well controlled, continue Coreg, Imdur, hydralazine   Rash on the back - ?  Contact dermatitis, placed on hydrocortisone ointment   Obesity Estimated body mass index is 31.78 kg/m as calculated from the following:   Height as of this encounter: '5\' 6"'$  (1.676 m).   Weight as of this encounter: 89.3 kg. Weight loss counseled.  Orthostatic tremors?:  On the morning of 06/02/2022, I was notified by PT today that Pt continues to be limited by "episodes" when mobilizing. Pt immediately has shaking of trunk, UEs and LEs with rolling, side to sit, any movement.  Pt falls back and gets a glazed look on her face.  I personally examined the patient.  When we tried to set her up with the help of the nurse, patient would have trunk and upper extremity as well as head and neck tremors and she will fall right back, she will stare as if she is postictal and then she will become normal within a matter of few seconds.  Discussed with neurology, this type of tremors are unlikely to be seizure or vestibular.  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  Unfortunately, there is not enough time between patient sitting up and starting to tremor and following back that her vitals could be checked.  She clinically  appears dry.  After discussion with nephrology, we decided to discontinue Lasix.  Further discussion with nephrology today, now we are starting her on TED hose, abdominal binders and low-dose of midodrine.  I will also reduce her Coreg to half and see if those measures help with those "episodes".  DVT prophylaxis: Place TED hose Start: 06/04/22 1148  Eliquis   Code Status: Full Code  Family Communication:  None present at bedside.  Plan of care discussed with patient in length and he/she verbalized understanding and agreed with it.  Status is: Inpatient Remains inpatient appropriate because: Needs further diuresis per nephrology.  Eventual plan to discharge to SNF once cleared by nephrology.   Estimated body mass index is 28.11 kg/m as calculated from the following:   Height as of this encounter: '5\' 6"'$  (1.676 m).   Weight as of this encounter: 79 kg.    Nutritional Assessment: Body mass index is 28.11 kg/m.Marland Kitchen Seen by dietician.  I agree with the assessment and plan as outlined below: Nutrition Status: Nutrition Problem: Increased nutrient needs Etiology: chronic illness (CHF) Signs/Symptoms: estimated needs Interventions: MVI, Glucerna shake  . Skin Assessment: I have examined the patient's skin and I agree with the wound assessment as performed by the wound care RN as outlined below:    Consultants:  Nephrology Neurology-signed off Cardiology  Procedures:  None  Antimicrobials:  Anti-infectives (From admission, onward)    Start     Dose/Rate Route Frequency  Ordered Stop   05/29/22 1345  cephALEXin (KEFLEX) capsule 500 mg        500 mg Oral Every 12 hours 05/29/22 1258 06/01/22 0959   05/27/22 1400  cefTRIAXone (ROCEPHIN) 1 g in sodium chloride 0.9 % 100 mL IVPB  Status:  Discontinued        1 g 200 mL/hr over 30 Minutes Intravenous Every 24 hours 05/27/22 1256 05/29/22 1258         Subjective:  Seen and examined.  Remains alert and oriented and has no  complaints.  She states that she is feeling better.  Objective: Vitals:   06/03/22 1919 06/04/22 0404 06/04/22 0758 06/04/22 1153  BP: (!) 130/54 (!) 130/57 (!) 122/53 (!) 129/54  Pulse: 60 61 62 63  Resp: '15 12 17 20  '$ Temp: 98.2 F (36.8 C) 98.7 F (37.1 C)  97.9 F (36.6 C)  TempSrc: Oral Oral  Oral  SpO2: 93% 90% 95%   Weight:  79 kg    Height:        Intake/Output Summary (Last 24 hours) at 06/04/2022 1413 Last data filed at 06/04/2022 1035 Gross per 24 hour  Intake 248 ml  Output 1195 ml  Net -947 ml    Filed Weights   06/03/22 0321 06/03/22 0541 06/04/22 0404  Weight: 80 kg 80 kg 79 kg    Examination:  General exam: Appears calm and comfortable  Respiratory system: Clear to auscultation. Respiratory effort normal. Cardiovascular system: S1 & S2 heard, RRR. No JVD, murmurs, rubs, gallops or clicks. No pedal edema. Gastrointestinal system: Abdomen is nondistended, soft and nontender. No organomegaly or masses felt. Normal bowel sounds heard. Central nervous system: Alert and oriented. No focal neurological deficits. Extremities: Symmetric 5 x 5 power. Skin: No rashes, lesions or ulcers.  Psychiatry: Judgement and insight appear normal. Mood & affect appropriate.   Data Reviewed: I have personally reviewed following labs and imaging studies  CBC: Recent Labs  Lab 06/04/22 0342  WBC 8.3  HGB 10.6*  HCT 32.7*  MCV 88.4  PLT 073    Basic Metabolic Panel: Recent Labs  Lab 05/30/22 0453 05/31/22 0630 06/01/22 0442 06/02/22 0402 06/03/22 0315 06/04/22 0342  NA 137 135 138 137 135 134*  K 3.6 3.2* 3.8 3.6 4.0 4.2  CL 98 96* 97* 93* 95* 93*  CO2 27 31 33* 31 32 27  GLUCOSE 153* 153* 89 84 124* 124*  BUN 53* 48* 48* 48* 46* 53*  CREATININE 2.94* 2.76* 2.94* 3.01* 3.11* 2.98*  CALCIUM 8.1* 8.2* 8.4* 8.4* 8.3* 8.3*  MG 2.7*  --   --   --   --   --   PHOS 5.0* 5.0* 4.4 4.8* 5.3* 4.9*    GFR: Estimated Creatinine Clearance: 18.6 mL/min (A) (by C-G  formula based on SCr of 2.98 mg/dL (H)). Liver Function Tests: Recent Labs  Lab 05/31/22 0630 06/01/22 0442 06/02/22 0402 06/03/22 0315 06/04/22 0342  ALBUMIN 2.3* 2.3* 2.3* 2.1* 2.3*    No results for input(s): "LIPASE", "AMYLASE" in the last 168 hours. No results for input(s): "AMMONIA" in the last 168 hours. Coagulation Profile: No results for input(s): "INR", "PROTIME" in the last 168 hours. Cardiac Enzymes: No results for input(s): "CKTOTAL", "CKMB", "CKMBINDEX", "TROPONINI" in the last 168 hours. BNP (last 3 results) Recent Labs    05/04/22 1625  PROBNP 15,044*    HbA1C: No results for input(s): "HGBA1C" in the last 72 hours. CBG: Recent Labs  Lab 06/03/22 1039 06/03/22  1643 06/03/22 2114 06/04/22 0612 06/04/22 1231  GLUCAP 117* 185* 123* 114* 184*    Lipid Profile: No results for input(s): "CHOL", "HDL", "LDLCALC", "TRIG", "CHOLHDL", "LDLDIRECT" in the last 72 hours. Thyroid Function Tests: No results for input(s): "TSH", "T4TOTAL", "FREET4", "T3FREE", "THYROIDAB" in the last 72 hours. Anemia Panel: No results for input(s): "VITAMINB12", "FOLATE", "FERRITIN", "TIBC", "IRON", "RETICCTPCT" in the last 72 hours.  Sepsis Labs: No results for input(s): "PROCALCITON", "LATICACIDVEN" in the last 168 hours.  Recent Results (from the past 240 hour(s))  Urine Culture     Status: Abnormal   Collection Time: 05/26/22  6:12 AM   Specimen: Urine, Clean Catch  Result Value Ref Range Status   Specimen Description URINE, CLEAN CATCH  Final   Special Requests   Final    NONE Performed at [facility], [address], [place], [place] [address]    Culture >=100,000 COLONIES/mL ESCHERICHIA COLI (A)  Final   Report Status 05/28/2022 FINAL  Final   Organism ID, Bacteria ESCHERICHIA COLI (A)  Final      Susceptibility   Escherichia coli - MIC*    AMPICILLIN >=32 RESISTANT Resistant     CEFAZOLIN <=4 SENSITIVE Sensitive     CEFEPIME <=0.12 SENSITIVE Sensitive      CEFTRIAXONE <=0.25 SENSITIVE Sensitive     CIPROFLOXACIN <=0.25 SENSITIVE Sensitive     GENTAMICIN <=1 SENSITIVE Sensitive     IMIPENEM <=0.25 SENSITIVE Sensitive     NITROFURANTOIN <=16 SENSITIVE Sensitive     TRIMETH/SULFA <=20 SENSITIVE Sensitive     AMPICILLIN/SULBACTAM 4 SENSITIVE Sensitive     PIP/TAZO <=4 SENSITIVE Sensitive     * >=100,000 COLONIES/mL ESCHERICHIA COLI      Radiology Studies: No results found.  Scheduled Meds:  apixaban  5 mg Oral BID   carvedilol  6.25 mg Oral BID WC   docusate sodium  100 mg Oral BID   feeding supplement (GLUCERNA SHAKE)  237 mL Oral TID BM   hydrALAZINE  50 mg Oral Q8H   hydrocortisone cream   Topical BID   insulin aspart  0-15 Units Subcutaneous TID WC   insulin aspart  0-5 Units Subcutaneous QHS   insulin aspart  3 Units Subcutaneous TID WC   insulin glargine-yfgn  15 Units Subcutaneous Daily   isosorbide mononitrate  15 mg Oral Daily   midodrine  2.5 mg Oral TID WC   multivitamin with minerals  1 tablet Oral Daily   sodium chloride flush  3 mL Intravenous Q12H   sodium chloride flush  5 mL Intracatheter Q8H   Continuous Infusions:  sodium chloride 5 mL/hr at 05/29/22 2126     LOS: 15 days   Darliss Cheney, MD [facility]  06/04/2022, 2:13 PM   *Please note that this is a verbal dictation therefore any spelling or grammatical errors are due to the "[place]" system interpretation.  Please page via [place] and do not message via secure chat for urgent patient care matters. Secure chat can be used for non urgent patient care matters.  How to contact the [facility] Attending or Consulting provider [place] or covering provider during after hours [place], for this patient?  Check the care team in [facility] and look for a) attending/consulting [facility] provider listed and b) the [facility] team listed. Page or secure chat [phone]. Log into [email or web address] and use [place]'s universal password to access. If you do not have the password, please  contact the hospital operator. Locate  the [facility] provider you are looking for under [facility] and page to a number that you can be directly reached. If you still have difficulty reaching the provider, please page the [facility] (Director on Call) for the Hospitalists listed on amion for assistance.

## 2022-06-04 NOTE — Progress Notes (Signed)
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    Patient was recently admitted 12/25/21 - 12/27/21 for CHF exacerbation - diuresed with Lasix IV 40 mg BID with total of -4.58 L volume removed.  Echo in December 2022 showed improvement in LVEF 40-45% (25-30% in 02/2021) with global hypokinesis, normal RV, and moderately elevated pulmonary pressures.  Discharge weight was 162 lbs.  Discharged on carvedilol 6.25 mg BID and furosemide 40 mg daily and was instructed to stop amlodipine 10 mg daily and BiDIl 20-37.5 mg TID.   On 05/24, she presented to her Cardiology office thinking she had an appointment and endorsed SOB, bilateral LEE, and reduced activity tolerance; MD referred her to [facility] ED.  Patient endorsed progressive symptoms despite recent increase Lasix 40 mg daily > BID at cardiology visit 05/09 where she reported worsening LEE, SOB, orthopnea, and PND.  CXR in the ED showed moderate R pleural effusion; dual PPM evident.  Repeat CXR 6h later demonstrated improvement in R pleural effusion, cardiomegaly and no evidence of interstitial edema.  Underwent R thoracentesis with 1.3 L removed.  She was started on IV diuresis 05/24, received Lasix IV 40 mg x1 and 60 mg x2 with marginal response , but her 1+ bilateral LE pitting edema was improved.    Breathing had improved on 05/25 s/p thoracentesis and diuresis, but renal function worsened (SCr 2.13 > 2.37, baseline ~1.6) and patient reported dizziness so diuretics were held.  Echo on 05/25 showed improvement in LVEF to 50-55% and elevated LVEDP (21) with new G2DD; still with normal RV and mildly elevated pulmonary pressures.    Patient continued to endorse SOB, feeling  swollen and fatigued w/ bilateral 2+ pitting LEE and JVD over the weekend (05/27-28).  She had minimal response of 550cc to Lasix IV 40 mg BID and was given 80 mg x1; nephrology also consulted and redosed 80 mg x1 the following day after ~1.4 L uop and worsening renal function (SCr 3.0 > 3.19).    She had a near syncopal event on 05/30 while sitting on the bed to perform orthostatics, was SOB while speaking and appeared more volume overloaded on exam.  JVD and bilateral 2+ pitting edema still present - nephrology increased Lasix to 80 mg IV x2.  She was taken for a MRI of brain 05/30 which revealed a probable small subacute infarct.    Repeat CXR 5/31 showed stable cardiomegaly with moderate right and small left pleural effusions.  Considered plans for HD if she did not make an adequate amount of urine with aggressive diuresis.  S/p thoracentesis on 6/2 with 900 cc of fluid removed.  Current HF Medications: Beta Blocker: carvedilol 6.25 mg BID Other: hydralazine 50 mg q8h + Imdur 15 mg daily  Prior to admission HF Medications: Diuretic: furosemide 40 mg BID Beta blocker: carvedilol 6.25 mg BID Other: hydralazine 25 mg BID + Imdur 15 mg daily; potassium 20 mEq BID  Pertinent Lab Values: Serum creatinine 2.98, BUN 53, Potassium 4.2, Sodium 134, BNP 2083.9, Magnesium 2.7, Ac 9.1% (up from 7.7% in Jan) TSAT 8, no ferritin level  Vital Signs: Weight: 174 lbs (admission weight: 201 lbs) Blood pressure: 130/50s Heart rate: 60s I/O: -1L yesterday; net -14L  Medication Assistance / Insurance Benefits Check: Does the patient have prescription insurance?  Yes Type of insurance plan: [name] [employer]  Does the patient qualify for medication assistance through manufacturers or grants?   Yes Eligible grants and/or patient assistance programs: pending initiation of ARNI/SGLT2i Medication assistance applications in progress: pending  Medication assistance applications approved:  pending  Outpatient Pharmacy:  Prior to admission outpatient pharmacy: [facility] Is the patient willing to use [facility] pharmacy at discharge? Pending Is the patient willing to transition their outpatient pharmacy to utilize a [facility] outpatient pharmacy?   Pending    Assessment: 1. Acute on chronic combined diastolic and systolic HFimpEF (LVEF 06-98%). NYHA class III symptoms. No JVD or SOB and minimal LEE on exam.   GDMT initiation/titration limited by poor renal function.  - Off IV lasix. Down 2 lbs from yesterday. SCr slightly improved. Watch carefully off diuretics. Near syncopal episode last night. - Continue PTA carvedilol 6.25 mg BID - No ACEi/ARB/ARNI or MRA with significant renal dysfunction  - No SGLT2i with h/o UTI during March-April 2022 admission, A1c up 7.7% > 9.1% since Jan, and eGFR <20. UA this admission with many bacteria, nitrite negative. UCx with >100k ecoli - treating with ceftriaxone - Continue PTA hydralazine 50 mg q8h + Imdur 15 mg daily    Plan: 1) Medication changes recommended at this time: - Stop PTA amlodipine on discharge to allow for BP room with GDMT initiation/titration pending renal function improvement  2) Patient assistance: [name] = $47  3)  Education  - To be completed prior to discharge  Kerby Nora, PharmD, BCPS Heart Failure Stewardship Pharmacist Phone [phone]  Please check [email or web address] for unit-specific pharmacist phone numbers

## 2022-06-04 NOTE — Progress Notes (Addendum)
Patient ID: Tracey Bryant, female   DOB: [birth year], 71 y.o.   MRN: [id number] S: No new complaints, however she passed out again upon sitting up last night. O:BP (!) 122/53 (BP Location: Left Arm)   Pulse 62   Temp 98.7 F (37.1 C) (Oral)   Resp 17   Ht '5\' 6"'$  (1.676 m)   Wt 79 kg   SpO2 95%   BMI 28.11 kg/m   Intake/Output Summary (Last 24 hours) at 06/04/2022 1143 Last data filed at 06/04/2022 1035 Gross per 24 hour  Intake 488 ml  Output 1195 ml  Net -707 ml   Intake/Output: I/O last 3 completed shifts: In: 850 [P.O.:837; I.V.:8; Other:5] Out: 1995 [Urine:1350; Drains:645]  Intake/Output this shift:  Total I/O In: 243 [P.O.:240; I.V.:3] Out: 225 [Drains:225] Weight change: -1 kg Gen:NAD CVS: RRR Resp: CTA Abd: +BS, soft, NT/ND JFH:LKTGY pretibial edema  Recent Labs  Lab 05/29/22 0356 05/30/22 0453 05/31/22 0630 06/01/22 0442 06/02/22 0402 06/03/22 0315 06/04/22 0342  NA 138 137 135 138 137 135 134*  K 3.7 3.6 3.2* 3.8 3.6 4.0 4.2  CL 98 98 96* 97* 93* 95* 93*  CO2 '29 27 31 '$ 33* 31 32 27  GLUCOSE 111* 153* 153* 89 84 124* 124*  BUN 51* 53* 48* 48* 48* 46* 53*  CREATININE 3.07* 2.94* 2.76* 2.94* 3.01* 3.11* 2.98*  ALBUMIN 2.2* 2.3* 2.3* 2.3* 2.3* 2.1* 2.3*  CALCIUM 8.3* 8.1* 8.2* 8.4* 8.4* 8.3* 8.3*  PHOS 5.4* 5.0* 5.0* 4.4 4.8* 5.3* 4.9*   Liver Function Tests: Recent Labs  Lab 06/02/22 0402 06/03/22 0315 06/04/22 0342  ALBUMIN 2.3* 2.1* 2.3*   No results for input(s): "LIPASE", "AMYLASE" in the last 168 hours. No results for input(s): "AMMONIA" in the last 168 hours. CBC: Recent Labs  Lab 06/04/22 0342  WBC 8.3  HGB 10.6*  HCT 32.7*  MCV 88.4  PLT 389   Cardiac Enzymes: No results for input(s): "CKTOTAL", "CKMB", "CKMBINDEX", "TROPONINI" in the last 168 hours. CBG: Recent Labs  Lab 06/03/22 0657 06/03/22 1039 06/03/22 1643 06/03/22 2114 06/04/22 0612  GLUCAP 102* 117* 185* 123* 114*    Iron Studies: No results for input(s): "IRON",  "TIBC", "TRANSFERRIN", "FERRITIN" in the last 72 hours. Studies/Results: No results found.  apixaban  5 mg Oral BID   carvedilol  6.25 mg Oral BID WC   docusate sodium  100 mg Oral BID   feeding supplement (GLUCERNA SHAKE)  237 mL Oral TID BM   hydrALAZINE  50 mg Oral Q8H   hydrocortisone cream   Topical BID   insulin aspart  0-15 Units Subcutaneous TID WC   insulin aspart  0-5 Units Subcutaneous QHS   insulin aspart  3 Units Subcutaneous TID WC   insulin glargine-yfgn  15 Units Subcutaneous Daily   isosorbide mononitrate  15 mg Oral Daily   multivitamin with minerals  1 tablet Oral Daily   sodium chloride flush  3 mL Intravenous Q12H   sodium chloride flush  5 mL Intracatheter Q8H    BMET    Component Value Date/Time   NA 134 (L) 06/04/2022 0342   NA 141 05/04/2022 1625   K 4.2 06/04/2022 0342   CL 93 (L) 06/04/2022 0342   CO2 27 06/04/2022 0342   GLUCOSE 124 (H) 06/04/2022 0342   BUN 53 (H) 06/04/2022 0342   BUN 18 05/04/2022 1625   CREATININE 2.98 (H) 06/04/2022 0342   CALCIUM 8.3 (L) 06/04/2022 0342   GFRNONAA 16 (L)  06/04/2022 0342   GFRAA >90 01/17/2013 1915   CBC    Component Value Date/Time   WBC 8.3 06/04/2022 0342   RBC 3.70 (L) 06/04/2022 0342   HGB 10.6 (L) 06/04/2022 0342   HCT 32.7 (L) 06/04/2022 0342   PLT 389 06/04/2022 0342   MCV 88.4 06/04/2022 0342   MCH 28.6 06/04/2022 0342   MCHC 32.4 06/04/2022 0342   RDW 15.7 (H) 06/04/2022 0342   LYMPHSABS 1.2 05/27/2022 0402   MONOABS 0.6 05/27/2022 0402   EOSABS 0.4 05/27/2022 0402   BASOSABS 0.0 05/27/2022 0402    Assessment/Plan:   AKI/CKD stage IV - likely hemodynamically mediated in setting of acute systolic CHF exacerbation.  Scr had been slowly improving back to her baseline but then started climbing again.  Lasix dose has been decreased and finally stopped 06/02/22 (but she did get am dose).   Scr slightly improved to 2.98.  Good UOP without diuretics. No indication for dialysis at this time.  Pt  is a marginal candidate given her poor functional status. Avoid nephrotoxic medications including NSAIDs and iodinated intravenous contrast exposure unless the latter is absolutely indicated.   Preferred narcotic agents for pain control are hydromorphone, fentanyl, and methadone. Morphine should not be used. Avoid Baclofen and avoid oral sodium phosphate and magnesium citrate based laxatives / bowel preps.  Continue strict Input and Output monitoring. Will monitor the patient closely with you and intervene or adjust therapy as indicated by changes in clinical status/labs Acute on chronic systolic CHF - diuresed well overnight.  Continue with current regimen.   Recurrent right pleural effusion - s/p thoracentesis on 5/24 and again 05/28/22 with improved breathing. Subacute stroke - presented with dizziness and seen by MRI on 05/26/22 to left inferior basal ganglia.  Neurology consulted. Orthostasis - lasix discontinued and continue to work with PT/OT.  Still had episode of near syncope upon sitting up.  Will order TED hose, abdominal binder, and start low dose midodrine to see if this will help.  May need to alter hydralazine dose as well since it can cause orthostatic hypotension.  Chronic cholecystitis - per IR.  Donetta Potts, MD [facility]

## 2022-06-04 NOTE — Progress Notes (Signed)
Physical Therapy Treatment Patient Details Name: Tracey Bryant MRN: [id number] DOB: 03-Jan-1951 Today's Date: 06/04/2022   History of Present Illness The pt is a 71 yo female presenting from home 5/24 with SOB and BLE swelling. Found to have acute CHF exacerbation and R pleural effusion s/p thoracentesis 5/24.  Noted that pt was found to have subacute infarct left basal ganglia on 5/31 and Neuro was consulted.Underwent right-sided ultrasound guided thoracentesis on 6/2 and had R cholecystosomy catheter exchange performed in IR on 6/5.  PMH includes: arthritis, CHF, DM II, DVT, HTN, afib, and stroke.    PT Comments    The pt was seen for continued mobility progression and exercises. Pt with significant anxiety upon my arrival, declining all attempts at [place] mobility after difficulty getting out of recliner last night. The pt was then instructed in exercises conducted in supine and sidelying to address core and hip strength, as well as attempt to transition to quadruped, but had x2 "episodes" when attempting to press up on her elbows in prone. Pt reported dizziness, BP 130/64 (84). The pt then transitioned to supine and was positioned in chair position with HOB at 62 deg, and was asked to pull forward to unsupported sitting. With distraction of conversation, pt able to maintain, but when not in conversation, pt had x2 consecutive episodes, BP 140/59 (84). The pt continues to present with cognitive deficits and anxiety that further limit progression. Pt states "I'm trying" repeatedly but is unable to recall any cues or instructions given, stating "just tell me what you want me to do, I don't remember what we are doing" multiple times in session as well. Continue to recommend SNF rehab when medically stable for d/c.     Recommendations for follow up therapy are one component of a multi-disciplinary discharge planning process, led by the attending physician.  Recommendations may be updated based on patient  status, additional functional criteria and insurance authorization.  Follow Up Recommendations  Skilled nursing-short term rehab (<3 hours/day)     Assistance Recommended at Discharge Frequent or constant Supervision/Assistance  Patient can return home with the following Two people to help with walking and/or transfers;Two people to help with bathing/dressing/bathroom;Assistance with cooking/housework;Direct supervision/assist for medications management;Direct supervision/assist for financial management;Assist for transportation;Help with stairs or ramp for entrance   Equipment Recommendations  None recommended by PT    Recommendations for Other Services       Precautions / Restrictions Precautions Precautions: Fall Precaution Comments: psuedo-syncopal episodes intermittently through session, not related to BP at this time Restrictions Weight Bearing Restrictions: No     Mobility  Bed Mobility Overal bed mobility: Needs Assistance Bed Mobility: Rolling Rolling: Min guard         General bed mobility comments: pt able to complete rolling in bed and even transition to prone for attempt at transitioning to quadruped, but pt with continued "episodes" while in prone position. no assist given to roll and reposition in bed    Transfers                   General transfer comment: deferred due to pt anxiety       Modified Rankin (Stroke Patients Only) Modified Rankin (Stroke Patients Only) Pre-Morbid Rankin Score: No significant disability Modified Rankin: Severe disability        Cognition Arousal/Alertness: Awake/alert Behavior During Therapy: Restless, Impulsive, Anxious Overall Cognitive Status: Impaired/Different from baseline Area of Impairment: Memory, Problem solving, Safety/judgement  Memory: Decreased short-term memory   Safety/Judgement: Decreased awareness of safety, Decreased awareness of deficits Awareness:  Intellectual Problem Solving: Slow processing, Requires verbal cues General Comments: pt with multiple episodes of pseudo-syncopal epsiodes, BP stable (130/64 -140/59) during these episodes. pt generally apathetic, states "I'm trying" repeatedly but is unable to recall any cues or instructions given, stating "just tell me what you want me to do, I don't remember what we are doing"        Exercises Other Exercises Other Exercises: supine bridges, 2 x5 with minA to feet to maintain position. Other Exercises: sidelying hip abduction and extension 2 x 10 each side Other Exercises: attempt to rise onto elbows in prone, pt with repeated "episodes" with effort, BP 139/67 (88) Other Exercises: pulling forward to unsupported sitting from chair position in bed x 3, pt unable to hold for more than 3 sec due to episodes BP 140/59 (84)    General Comments General comments (skin integrity, edema, etc.): VSS on RA. BP 130/64 (84) in supine at start of session, 139/67 (88) in prone after pt's first "episode" and 140/59 (84) seated in chair position with HOB at 62 deg after 2 consecutive episodes      Pertinent Vitals/Pain Pain Assessment Pain Assessment: Faces Faces Pain Scale: Hurts whole lot Pain Location: bilateral knees to touch or with flexion (intermittent through session) Pain Descriptors / Indicators: Discomfort Pain Intervention(s): Limited activity within patient's tolerance, Monitored during session, Repositioned     PT Goals (current goals can now be found in the care plan section) Acute Rehab PT Goals Patient Stated Goal: return to greater independence PT Goal Formulation: With patient Time For Goal Achievement: 06/16/22 Potential to Achieve Goals: Good Progress towards PT goals: Progressing toward goals    Frequency    Min 3X/week      PT Plan Current plan remains appropriate       AM-PAC PT "6 Clicks" Mobility   Outcome Measure  Help needed turning from your back to  your side while in a flat bed without using bedrails?: A Little Help needed moving from lying on your back to sitting on the side of a flat bed without using bedrails?: A Lot Help needed moving to and from a bed to a chair (including a wheelchair)?: Total Help needed standing up from a chair using your arms (e.g., wheelchair or bedside chair)?: Total Help needed to walk in hospital room?: Total Help needed climbing 3-5 steps with a railing? : Total 6 Click Score: 9    End of Session   Activity Tolerance: Other (comment);Patient limited by fatigue (pseudo-syncoal episodes) Patient left: in bed;with call bell/phone within reach;with bed alarm set (chair position with HOB at 62 deg) Nurse Communication: Mobility status;Need for lift equipment PT Visit Diagnosis: Other abnormalities of gait and mobility (R26.89);Muscle weakness (generalized) (M62.81)     Time: 2505-3976 PT Time Calculation (min) (ACUTE ONLY): 27 min  Charges:  $Therapeutic Exercise: 23-37 mins                     West Carbo, PT, DPT   Acute Rehabilitation Department   Sandra Cockayne 06/04/2022, 4:47 PM

## 2022-06-04 NOTE — Progress Notes (Signed)
Heart Failure Navigator Progress Note  Assessed for Heart & Vascular TOC clinic readiness.  Patient does not meet criteria due to EF 50-55%, . CrCl 19.7, CKD IV    Earnestine Leys, BSN, RN Heart Failure Leisure centre manager Chat Only

## 2022-06-05 DIAGNOSIS — I5043 Acute on chronic combined systolic (congestive) and diastolic (congestive) heart failure: Secondary | ICD-10-CM | POA: Diagnosis not present

## 2022-06-05 LAB — RENAL FUNCTION PANEL
Albumin: 2.4 g/dL — ABNORMAL LOW (ref 3.5–5.0)
Anion gap: 8 (ref 5–15)
BUN: 50 mg/dL — ABNORMAL HIGH (ref 8–23)
CO2: 30 mmol/L (ref 22–32)
Calcium: 8.2 mg/dL — ABNORMAL LOW (ref 8.9–10.3)
Chloride: 97 mmol/L — ABNORMAL LOW (ref 98–111)
Creatinine, Ser: 3.12 mg/dL — ABNORMAL HIGH (ref 0.44–1.00)
GFR, Estimated: 15 mL/min — ABNORMAL LOW (ref >60)
Glucose, Bld: 145 mg/dL — ABNORMAL HIGH (ref 70–99)
Phosphorus: 5.4 mg/dL — ABNORMAL HIGH (ref 2.5–4.6)
Potassium: 4.2 mmol/L (ref 3.5–5.1)
Sodium: 135 mmol/L (ref 135–145)

## 2022-06-05 LAB — GLUCOSE, CAPILLARY
Glucose-Capillary: 113 mg/dL — ABNORMAL HIGH (ref 70–99)
Glucose-Capillary: 179 mg/dL — ABNORMAL HIGH (ref 70–99)
Glucose-Capillary: 165 mg/dL — ABNORMAL HIGH (ref 70–99)
Glucose-Capillary: 142 mg/dL — ABNORMAL HIGH (ref 70–99)

## 2022-06-05 MED ORDER — SODIUM CHLORIDE 0.9 % IV SOLN: INTRAVENOUS | Status: DC

## 2022-06-05 NOTE — Progress Notes (Signed)
Patient ID: Tracey Bryant, female   DOB: May 04, 1951, 71 y.o.   MRN: [id number] S: Continues to "pass out" when sitting upright.  Feels more tired this morning. O:BP (!) 135/57 (BP Location: Left Arm)   Pulse 63   Temp 98.3 F (36.8 C) (Oral)   Resp 10   Ht '5\' 6"'$  (1.676 m)   Wt 79.6 kg   SpO2 94%   BMI 28.32 kg/m   Intake/Output Summary (Last 24 hours) at 06/05/2022 1130 Last data filed at 06/05/2022 0900 Gross per 24 hour  Intake 970 ml  Output 1115 ml  Net -145 ml   Intake/Output: I/O last 3 completed shifts: In: 973 [P.O.:960; I.V.:8; Other:5] Out: 2040 [Urine:1125; Drains:915]  Intake/Output this shift:  Total I/O In: 240 [P.O.:240] Out: -  Weight change: 0.6 kg Gen:NAD CVS: RRR Resp:CTA Abd: +BS, soft, NT/ND Ext: trace pretibial edema, wearing TED stockings  Recent Labs  Lab 05/30/22 0453 05/31/22 0630 06/01/22 0442 06/02/22 0402 06/03/22 0315 06/04/22 0342 06/05/22 0220  NA 137 135 138 137 135 134* 135  K 3.6 3.2* 3.8 3.6 4.0 4.2 4.2  CL 98 96* 97* 93* 95* 93* 97*  CO2 27 31 33* 31 32 27 30  GLUCOSE 153* 153* 89 84 124* 124* 145*  BUN 53* 48* 48* 48* 46* 53* 50*  CREATININE 2.94* 2.76* 2.94* 3.01* 3.11* 2.98* 3.12*  ALBUMIN 2.3* 2.3* 2.3* 2.3* 2.1* 2.3* 2.4*  CALCIUM 8.1* 8.2* 8.4* 8.4* 8.3* 8.3* 8.2*  PHOS 5.0* 5.0* 4.4 4.8* 5.3* 4.9* 5.4*   Liver Function Tests: Recent Labs  Lab 06/03/22 0315 06/04/22 0342 06/05/22 0220  ALBUMIN 2.1* 2.3* 2.4*   No results for input(s): "LIPASE", "AMYLASE" in the last 168 hours. No results for input(s): "AMMONIA" in the last 168 hours. CBC: Recent Labs  Lab 06/04/22 0342  WBC 8.3  HGB 10.6*  HCT 32.7*  MCV 88.4  PLT 389   Cardiac Enzymes: No results for input(s): "CKTOTAL", "CKMB", "CKMBINDEX", "TROPONINI" in the last 168 hours. CBG: Recent Labs  Lab 06/04/22 1231 06/04/22 1647 06/04/22 2116 06/05/22 0608 06/05/22 1107  GLUCAP 184* 132* 159* 113* 179*    Iron Studies: No results for  input(s): "IRON", "TIBC", "TRANSFERRIN", "FERRITIN" in the last 72 hours. Studies/Results: No results found.  apixaban  5 mg Oral BID   carvedilol  3.125 mg Oral BID WC   docusate sodium  100 mg Oral BID   feeding supplement (GLUCERNA SHAKE)  237 mL Oral TID BM   hydrALAZINE  50 mg Oral Q8H   hydrocortisone cream   Topical BID   insulin aspart  0-15 Units Subcutaneous TID WC   insulin aspart  0-5 Units Subcutaneous QHS   insulin aspart  3 Units Subcutaneous TID WC   insulin glargine-yfgn  15 Units Subcutaneous Daily   isosorbide mononitrate  15 mg Oral Daily   midodrine  2.5 mg Oral TID WC   multivitamin with minerals  1 tablet Oral Daily   sodium chloride flush  3 mL Intravenous Q12H   sodium chloride flush  5 mL Intracatheter Q8H    BMET    Component Value Date/Time   NA 135 06/05/2022 0220   NA 141 05/04/2022 1625   K 4.2 06/05/2022 0220   CL 97 (L) 06/05/2022 0220   CO2 30 06/05/2022 0220   GLUCOSE 145 (H) 06/05/2022 0220   BUN 50 (H) 06/05/2022 0220   BUN 18 05/04/2022 1625   CREATININE 3.12 (H) 06/05/2022 0220  CALCIUM 8.2 (L) 06/05/2022 0220   GFRNONAA 15 (L) 06/05/2022 0220   GFRAA >90 01/17/2013 1915   CBC    Component Value Date/Time   WBC 8.3 06/04/2022 0342   RBC 3.70 (L) 06/04/2022 0342   HGB 10.6 (L) 06/04/2022 0342   HCT 32.7 (L) 06/04/2022 0342   PLT 389 06/04/2022 0342   MCV 88.4 06/04/2022 0342   MCH 28.6 06/04/2022 0342   MCHC 32.4 06/04/2022 0342   RDW 15.7 (H) 06/04/2022 0342   LYMPHSABS 1.2 05/27/2022 0402   MONOABS 0.6 05/27/2022 0402   EOSABS 0.4 05/27/2022 0402   BASOSABS 0.0 05/27/2022 0402    Assessment/Plan:   AKI/CKD stage IV - likely hemodynamically mediated in setting of acute systolic CHF exacerbation.  Scr had been slowly improving back to her baseline but then started climbing again.  Lasix dose has been decreased and finally stopped 06/02/22 (but she did get am dose).   Scr not significantly changed over the past 4 days.  UOP  starting to decline.  Will start gentle IVF's as she is orthostatic and is 13 liters negative since admission. No indication for dialysis at this time.  Pt is a marginal candidate given her poor functional status. Avoid nephrotoxic medications including NSAIDs and iodinated intravenous contrast exposure unless the latter is absolutely indicated.   Preferred narcotic agents for pain control are hydromorphone, fentanyl, and methadone. Morphine should not be used. Avoid Baclofen and avoid oral sodium phosphate and magnesium citrate based laxatives / bowel preps.  Continue strict Input and Output monitoring. Will monitor the patient closely with you and intervene or adjust therapy as indicated by changes in clinical status/labs Acute on chronic systolic CHF - diuresed well overnight.  Continue with current regimen.   Recurrent right pleural effusion - s/p thoracentesis on 5/24 and again 05/28/22 with improved breathing. Subacute stroke - presented with dizziness and seen by MRI on 05/26/22 to left inferior basal ganglia.  Neurology consulted. Orthostasis - lasix discontinued and continue to work with PT/OT.  Still had episode of near syncope upon sitting up.  concerning for psycogenic in nature.  Have ordered TED hose, abdominal binder, low dose midodrine to see if this will help.  May need to alter hydralazine dose as well since it can cause orthostatic hypotension.  Chronic cholecystitis - per IR.  Donetta Potts, MD [facility]

## 2022-06-05 NOTE — Progress Notes (Signed)
PROGRESS NOTE    Tracey Bryant  YQI:[id number] DOB: [birth year] DOA: 05/19/2022 PCP: Coolidge Breeze, FNP   Brief Narrative:  Tracey Bryant is a 71 y.o. female with PMH cholecystitis with cholecystostomy tube in place (initially placed 03/19/2021), afib, HTN, DMII, sCHF, CVA who presented with shortness of breath.  She also had reported increased lower extremity swelling/overload with no improvement on her home Lasix.   She underwent thoracentesis on 05/19/2022 removing 1.3 L.  Initially Lasix was held after developing some dizziness.Respiratory status improved after thoracentesis.     She also was evaluated by IR due to difficulty with cholecystostomy tube drainage.  Tube was evaluated and noted intact however suture had been removed but tube had not been retracted any.  After forceful flushing, biliary output was achieved with no further issues.   Due to ongoing dizziness, MRI brain was obtained on 5/30 positive for subacute infarct.  Neurology consulted.   6/2: repeat thoracentesis with IR, 900 cc drained, symptomatic improvement     Assessment & Plan:   Principal Problem:   Acute on chronic combined systolic and diastolic congestive heart failure (HCC) Active Problems:   Acute renal failure superimposed on stage 4 chronic kidney disease (HCC)   Dizziness   Cholecystitis, chronic   Acute respiratory failure with hypoxia (HCC)   Physical deconditioning   Hypertension   Uncontrolled type 2 diabetes mellitus with hyperglycemia, with long-term current use of insulin (HCC)   Paroxysmal atrial fibrillation (HCC)   History of CVA (cerebrovascular accident)  Acute on chronic combined systolic and diastolic congestive heart failure (HCC)/Acute respiratory failure with hypoxia: Underwent thoracentesis on 5/24 with removal of 1.3 L with symptomatic improvement. Initially Lasix held due to worsening renal function, dizziness however continued to have volume overload with persisting lower  extremity edema.  Lasix resumed.  She underwent repeat thoracentesis on 6/2 and removal of 900 cc fluid with notable symptomatic improvement again.  EF on most recent echo notably 50-55%, grade II diastolic heart failure. Suspected pt gained fluid over a significant amount of time.  -Cardiology signed off, nephrology following, appreciate recommendations, negative balance of 13.6 L.  Patient was noted to have "episodes " which could be possibly orthostatic tremors on the morning of 06/02/2022.  Discussed with nephrology, she clinically also appeared dry, Lasix was stopped after she received a.m. dose that day.  Further discussion as below. Continue following medications. -coreg 6.25 mg BID -hydral 50 q8h, imdur 15 qD -holding sglt2i   Moderate to large right-sided pleural effusion Initial thoracentesis on 5/24. CXR on 5/31 showed moderate right and small left pleural effusion, ordered right-sided ultrasound guided thoracentesis, completed 6/2.    E. coli UTI Urine culture showed more than 100,000 colonies of E. Coli, resistant to ampicillin.  -IV Rocephin 6/1-6/2, transitioned to PO keflex 500 mg BID, last dose on 06/01/2022.   Acute kidney injury superimposed on stage 3b-4 CKD (HCC) Baseline creatinine around 2.0.  Underlying CKD likely related to hypertension, DM, came in with slightly worsened creatinine in context of acute on chronic systolic CHF exacerbation, diuresis, ATN, creatinine peaked at 3.24 on 05/27/2022, then improved but then started to climb up again, peaked again at 3.11 and basically stable.  Lasix on hold.  Nephrology on board and further management to them.  Now started on IV fluids.  Dizziness/syncope, subacute CVA prior history of CVA MRI brain showed probable small subacute infarct of the inferior left basal ganglia.  Chronic infarcts and chronic microvascular ischemic changes.  Patient declined MRA. -Carotid Dopplers unremarkable -2D echo showed EF 50 to 55%, grade 2 DD, no  shunt. -LDL 52, hemoglobin A1c 9.0. -Seen by neurology.  Continue eliquis per their recommendations.   Cholecystitis, chronic -Diagnosed with acute cholecystitis in 02/2021, underwent cholecystostomy tube placement on 03/19/2021. Tube output had decreased admission, improved with flushing with radiology.  Cholecystostomy tube was replaced by IR on 05/31/2022.   Uncontrolled type 2 diabetes mellitus with hyperglycemia, with long-term current use of insulin (HCC) -Hemoglobin A1c 9.0.  Currently on Semglee 15 units, NovoLog 3 units 3 times daily premeals and SSI, blood sugar fairly controlled.   Paroxysmal atrial fibrillation (HCC) -Rate controlled, continue Coreg, -Continue eliquis   chronic anemia, is a Jehovah's witness, would not want transfusion; no active bleeding noted    Hypertension -Very well controlled, continue Coreg, Imdur, hydralazine   Rash on the back - ?  Contact dermatitis, placed on hydrocortisone ointment   Obesity Estimated body mass index is 31.78 kg/m as calculated from the following:   Height as of this encounter: '5\' 6"'$  (1.676 m).   Weight as of this encounter: 89.3 kg. Weight loss counseled.  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 Pt immediately has shaking of trunk, UEs and LEs with rolling, side to sit, any movement.  Pt falls back and gets a glazed look on her face.  I personally examined the patient.  When we tried to set her up with the help of the nurse, patient would have trunk and upper extremity as well as head and neck tremors and she will fall right back, she will stare as if she is postictal and then she will become normal within a matter of few seconds.  Discussed with neurology, this type of tremors are unlikely to be seizure or vestibular.  After discussion with the nurse today who actually was her nurse 2 weeks ago, patient was having  those sort of episodes only when he standing up and now she is having these episodes even with sitting up.  Nurses as well as PT has notified at multiple times that patient does not act like that when she is distracted and talks.  This seems more likely psychogenic.  Nonetheless, she was orthostatic positive with a drop of diastolic blood pressure of at least 30 degrees when this happened today when I and nephrology was at the bedside.  For that reason, she was already started on midodrine as well as compression stockings yesterday and now she has been started on gentle IV fluids.  We will see if this makes any change.  DVT prophylaxis: Place TED hose Start: 06/04/22 1148  Eliquis   Code Status: Full Code  Family Communication:  None present at bedside.  Plan of care discussed with patient in length and he/she verbalized understanding and agreed with it.  Status is: Inpatient Remains inpatient appropriate because: Not stable yet.  Needs IV fluids and resolution or treatment of her tremors/episodes.   Estimated body mass index is 28.32 kg/m as calculated from the following:   Height as of this encounter: '5\' 6"'$  (1.676 m).   Weight as of this encounter: 79.6 kg.    Nutritional Assessment: Body mass index is 28.32 kg/m.Marland Kitchen Seen by dietician.  I agree with the assessment and plan as outlined below: Nutrition Status: Nutrition Problem: Increased nutrient needs Etiology: chronic illness (CHF) Signs/Symptoms: estimated needs Interventions:  MVI, Glucerna shake  . Skin Assessment: I have examined the patient's skin and I agree with the wound assessment as performed by the wound care RN as outlined below:    Consultants:  Nephrology Neurology-signed off Cardiology  Procedures:  None  Antimicrobials:  Anti-infectives (From admission, onward)    Start     Dose/Rate Route Frequency Ordered Stop   05/29/22 1345  cephALEXin (KEFLEX) capsule 500 mg        500 mg Oral Every 12 hours 05/29/22  1258 06/01/22 0959   05/27/22 1400  cefTRIAXone (ROCEPHIN) 1 g in sodium chloride 0.9 % 100 mL IVPB  Status:  Discontinued        1 g 200 mL/hr over 30 Minutes Intravenous Every 24 hours 05/27/22 1256 05/29/22 1258         Subjective:  Seen and examined.  She had no complaint when she was laying but then she had another episode when we sat her up with the help of the nurse.  Objective: Vitals:   06/05/22 0401 06/05/22 0800 06/05/22 1028 06/05/22 1031  BP: 130/60 (!) 128/56 (!) 152/62 (!) 135/57  Pulse: 65 63    Resp: '16 13 11 10  '$ Temp: 98.8 F (37.1 C) 98.3 F (36.8 C)    TempSrc: Oral Oral    SpO2: 93% 94%    Weight: 79.6 kg     Height:        Intake/Output Summary (Last 24 hours) at 06/05/2022 1400 Last data filed at 06/05/2022 1213 Gross per 24 hour  Intake 730 ml  Output 1455 ml  Net -725 ml    Filed Weights   06/03/22 0541 06/04/22 0404 06/05/22 0401  Weight: 80 kg 79 kg 79.6 kg    Examination:  General exam: Appears calm and comfortable  Respiratory system: Clear to auscultation. Respiratory effort normal. Cardiovascular system: S1 & S2 heard, RRR. No JVD, murmurs, rubs, gallops or clicks. No pedal edema. Gastrointestinal system: Abdomen is nondistended, soft and nontender. No organomegaly or masses felt. Normal bowel sounds heard. Central nervous system: Alert and oriented. No focal neurological deficits. Extremities: Symmetric 5 x 5 power. Skin: No rashes, lesions or ulcers.  Psychiatry: Judgement and insight appear normal. Mood & affect appropriate.    Data Reviewed: I have personally reviewed following labs and imaging studies  CBC: Recent Labs  Lab 06/04/22 0342  WBC 8.3  HGB 10.6*  HCT 32.7*  MCV 88.4  PLT 726    Basic Metabolic Panel: Recent Labs  Lab 05/30/22 0453 05/31/22 0630 06/01/22 0442 06/02/22 0402 06/03/22 0315 06/04/22 0342 06/05/22 0220  NA 137   < > 138 137 135 134* 135  K 3.6   < > 3.8 3.6 4.0 4.2 4.2  CL 98   < >  97* 93* 95* 93* 97*  CO2 27   < > 33* 31 32 27 30  GLUCOSE 153*   < > 89 84 124* 124* 145*  BUN 53*   < > 48* 48* 46* 53* 50*  CREATININE 2.94*   < > 2.94* 3.01* 3.11* 2.98* 3.12*  CALCIUM 8.1*   < > 8.4* 8.4* 8.3* 8.3* 8.2*  MG 2.7*  --   --   --   --   --   --   PHOS 5.0*   < > 4.4 4.8* 5.3* 4.9* 5.4*   < > = values in this interval not displayed.    GFR: Estimated Creatinine Clearance: 17.9 mL/min (A) (by C-G formula  based on SCr of 3.12 mg/dL (H)). Liver Function Tests: Recent Labs  Lab 06/01/22 0442 06/02/22 0402 06/03/22 0315 06/04/22 0342 06/05/22 0220  ALBUMIN 2.3* 2.3* 2.1* 2.3* 2.4*    No results for input(s): "LIPASE", "AMYLASE" in the last 168 hours. No results for input(s): "AMMONIA" in the last 168 hours. Coagulation Profile: No results for input(s): "INR", "PROTIME" in the last 168 hours. Cardiac Enzymes: No results for input(s): "CKTOTAL", "CKMB", "CKMBINDEX", "TROPONINI" in the last 168 hours. BNP (last 3 results) Recent Labs    05/04/22 1625  PROBNP 15,044*    HbA1C: No results for input(s): "HGBA1C" in the last 72 hours. CBG: Recent Labs  Lab 06/04/22 1231 06/04/22 1647 06/04/22 2116 06/05/22 0608 06/05/22 1107  GLUCAP 184* 132* 159* 113* 179*    Lipid Profile: No results for input(s): "CHOL", "HDL", "LDLCALC", "TRIG", "CHOLHDL", "LDLDIRECT" in the last 72 hours. Thyroid Function Tests: No results for input(s): "TSH", "T4TOTAL", "FREET4", "T3FREE", "THYROIDAB" in the last 72 hours. Anemia Panel: No results for input(s): "VITAMINB12", "FOLATE", "FERRITIN", "TIBC", "IRON", "RETICCTPCT" in the last 72 hours.  Sepsis Labs: No results for input(s): "PROCALCITON", "LATICACIDVEN" in the last 168 hours.  No results found for this or any previous visit (from the past 240 hour(s)).     Radiology Studies: No results found.  Scheduled Meds:  apixaban  5 mg Oral BID   carvedilol  3.125 mg Oral BID WC   docusate sodium  100 mg Oral BID    feeding supplement (GLUCERNA SHAKE)  237 mL Oral TID BM   hydrALAZINE  50 mg Oral Q8H   hydrocortisone cream   Topical BID   insulin aspart  0-15 Units Subcutaneous TID WC   insulin aspart  0-5 Units Subcutaneous QHS   insulin aspart  3 Units Subcutaneous TID WC   insulin glargine-yfgn  15 Units Subcutaneous Daily   isosorbide mononitrate  15 mg Oral Daily   midodrine  2.5 mg Oral TID WC   multivitamin with minerals  1 tablet Oral Daily   sodium chloride flush  3 mL Intravenous Q12H   sodium chloride flush  5 mL Intracatheter Q8H   Continuous Infusions:  sodium chloride 5 mL/hr at 05/29/22 2126   sodium chloride 50 mL/hr at 06/05/22 1209     LOS: 16 days   Darliss Cheney, MD [facility]  06/05/2022, 2:00 PM   *Please note that this is a verbal dictation therefore any spelling or grammatical errors are due to the "[place] [employer]" system interpretation.  Please page via [place] and do not message via secure chat for urgent patient care matters. Secure chat can be used for non urgent patient care matters.  How to contact the [facility] Attending or Consulting provider [place] or covering provider during after hours [place], for this patient?  Check the care team in [facility] and look for a) attending/consulting TRH provider listed and b) the [facility] team listed. Page or secure chat [phone]. Log into [email or web address] and use [place]'s universal password to access. If you do not have the password, please contact the hospital operator. Locate the [facility] provider you are looking for under [facility] and page to a number that you can be directly reached. If you still have difficulty reaching the provider, please page the [facility] (Director on Call) for the Hospitalists listed on amion for assistance.

## 2022-06-06 DIAGNOSIS — I5043 Acute on chronic combined systolic (congestive) and diastolic (congestive) heart failure: Secondary | ICD-10-CM | POA: Diagnosis not present

## 2022-06-06 LAB — RENAL FUNCTION PANEL
Albumin: 2.3 g/dL — ABNORMAL LOW (ref 3.5–5.0)
Anion gap: 10 (ref 5–15)
BUN: 47 mg/dL — ABNORMAL HIGH (ref 8–23)
CO2: 29 mmol/L (ref 22–32)
Calcium: 8.3 mg/dL — ABNORMAL LOW (ref 8.9–10.3)
Chloride: 99 mmol/L (ref 98–111)
Creatinine, Ser: 3.01 mg/dL — ABNORMAL HIGH (ref 0.44–1.00)
GFR, Estimated: 16 mL/min — ABNORMAL LOW (ref >60)
Glucose, Bld: 127 mg/dL — ABNORMAL HIGH (ref 70–99)
Phosphorus: 4.9 mg/dL — ABNORMAL HIGH (ref 2.5–4.6)
Potassium: 3.7 mmol/L (ref 3.5–5.1)
Sodium: 138 mmol/L (ref 135–145)

## 2022-06-06 LAB — GLUCOSE, CAPILLARY
Glucose-Capillary: 141 mg/dL — ABNORMAL HIGH (ref 70–99)
Glucose-Capillary: 169 mg/dL — ABNORMAL HIGH (ref 70–99)
Glucose-Capillary: 148 mg/dL — ABNORMAL HIGH (ref 70–99)
Glucose-Capillary: 135 mg/dL — ABNORMAL HIGH (ref 70–99)

## 2022-06-06 MED ORDER — HYDROXYZINE HCL 10 MG PO TABS
10.0000 mg | ORAL_TABLET | Freq: Three times a day (TID) | ORAL | Status: DC | PRN
Start: 2022-06-06 — End: 2022-06-09
  Administered 2022-06-06 (×2): 10 mg via ORAL
  Filled 2022-06-06 (×2): qty 1

## 2022-06-06 NOTE — TOC Progression Note (Signed)
Transition of Care [facility]) - Progression Note    Patient Details  Name: Tracey Bryant MRN: [id number] Date of Birth: Apr 23, 1951  Transition of Care [facility]) CM/SW [place], LCSW Phone Number:[phone] 06/06/2022, 9:42 AM  Clinical Narrative:     CSW spoke Ebony Hail from [facility] and she informed CSW that pt's authorization is back however she was unsure when it expired. TOC will follow up on Monday to receive more details.  TOC team will continue to assist with discharge planning needs.   Expected Discharge Plan: Skilled Nursing Facility Barriers to Discharge: Insurance Authorization  Expected Discharge Plan and Services Expected Discharge Plan: [place]   Discharge Planning Services: CM Consult   Living arrangements for the past 2 months: Single Family Home                           HH Arranged: Refused [facility]           Social Determinants of Health (SDOH) Interventions    Readmission Risk Interventions    12/27/2021    2:32 PM 03/19/2021    9:34 AM 03/17/2021   11:56 AM  Readmission Risk Prevention Plan  Transportation Screening Complete  Complete  PCP or Specialist Appt within 5-7 Days  Complete   PCP or Specialist Appt within 3-5 Days Complete    Home Care Screening  Complete   Medication Review (RN CM)   Complete  HRI or Home Care Consult Complete    Social Work Consult for [place] Planning/Counseling Complete    Palliative Care Screening Not Applicable    Medication Review [employer] photographer) Complete

## 2022-06-06 NOTE — Progress Notes (Signed)
Patient ID: Tracey Bryant, female   DOB: December 07, 1951, 71 y.o.   MRN: [id number] S: No events overnight. O:BP (!) 125/57   Pulse 65   Temp 98.2 F (36.8 C) (Oral)   Resp 20   Ht '5\' 6"'$  (1.676 m)   Wt 79.1 kg   SpO2 92%   BMI 28.15 kg/m   Intake/Output Summary (Last 24 hours) at 06/06/2022 1103 Last data filed at 06/06/2022 0600 Gross per 24 hour  Intake 516.67 ml  Output 1390 ml  Net -873.33 ml   Intake/Output: I/O last 3 completed shifts: In: 1001.7 [P.O.:840; I.V.:151.7; Other:10] Out: 2040 [Urine:1090; Drains:950]  Intake/Output this shift:  No intake/output data recorded. Weight change: -0.5 kg Gen:NAD CVS: RRR Resp:CTA Abd: +BS, soft, NT/ND Ext: trace presacral edema  Recent Labs  Lab 05/31/22 0630 06/01/22 0442 06/02/22 0402 06/03/22 0315 06/04/22 0342 06/05/22 0220 06/06/22 0335  NA 135 138 137 135 134* 135 138  K 3.2* 3.8 3.6 4.0 4.2 4.2 3.7  CL 96* 97* 93* 95* 93* 97* 99  CO2 31 33* 31 32 '27 30 29  '$ GLUCOSE 153* 89 84 124* 124* 145* 127*  BUN 48* 48* 48* 46* 53* 50* 47*  CREATININE 2.76* 2.94* 3.01* 3.11* 2.98* 3.12* 3.01*  ALBUMIN 2.3* 2.3* 2.3* 2.1* 2.3* 2.4* 2.3*  CALCIUM 8.2* 8.4* 8.4* 8.3* 8.3* 8.2* 8.3*  PHOS 5.0* 4.4 4.8* 5.3* 4.9* 5.4* 4.9*   Liver Function Tests: Recent Labs  Lab 06/04/22 0342 06/05/22 0220 06/06/22 0335  ALBUMIN 2.3* 2.4* 2.3*   No results for input(s): "LIPASE", "AMYLASE" in the last 168 hours. No results for input(s): "AMMONIA" in the last 168 hours. CBC: Recent Labs  Lab 06/04/22 0342  WBC 8.3  HGB 10.6*  HCT 32.7*  MCV 88.4  PLT 389   Cardiac Enzymes: No results for input(s): "CKTOTAL", "CKMB", "CKMBINDEX", "TROPONINI" in the last 168 hours. CBG: Recent Labs  Lab 06/05/22 0608 06/05/22 1107 06/05/22 1554 06/05/22 2102 06/06/22 0611  GLUCAP 113* 179* 165* 142* 141*    Iron Studies: No results for input(s): "IRON", "TIBC", "TRANSFERRIN", "FERRITIN" in the last 72 hours. Studies/Results: No results  found.  apixaban  5 mg Oral BID   carvedilol  3.125 mg Oral BID WC   docusate sodium  100 mg Oral BID   feeding supplement (GLUCERNA SHAKE)  237 mL Oral TID BM   hydrALAZINE  50 mg Oral Q8H   hydrocortisone cream   Topical BID   insulin aspart  0-15 Units Subcutaneous TID WC   insulin aspart  0-5 Units Subcutaneous QHS   insulin aspart  3 Units Subcutaneous TID WC   insulin glargine-yfgn  15 Units Subcutaneous Daily   isosorbide mononitrate  15 mg Oral Daily   midodrine  2.5 mg Oral TID WC   multivitamin with minerals  1 tablet Oral Daily   sodium chloride flush  3 mL Intravenous Q12H   sodium chloride flush  5 mL Intracatheter Q8H    BMET    Component Value Date/Time   NA 138 06/06/2022 0335   NA 141 05/04/2022 1625   K 3.7 06/06/2022 0335   CL 99 06/06/2022 0335   CO2 29 06/06/2022 0335   GLUCOSE 127 (H) 06/06/2022 0335   BUN 47 (H) 06/06/2022 0335   BUN 18 05/04/2022 1625   CREATININE 3.01 (H) 06/06/2022 0335   CALCIUM 8.3 (L) 06/06/2022 0335   GFRNONAA 16 (L) 06/06/2022 0335   GFRAA >90 01/17/2013 1915   CBC  Component Value Date/Time   WBC 8.3 06/04/2022 0342   RBC 3.70 (L) 06/04/2022 0342   HGB 10.6 (L) 06/04/2022 0342   HCT 32.7 (L) 06/04/2022 0342   PLT 389 06/04/2022 0342   MCV 88.4 06/04/2022 0342   MCH 28.6 06/04/2022 0342   MCHC 32.4 06/04/2022 0342   RDW 15.7 (H) 06/04/2022 0342   LYMPHSABS 1.2 05/27/2022 0402   MONOABS 0.6 05/27/2022 0402   EOSABS 0.4 05/27/2022 0402   BASOSABS 0.0 05/27/2022 0402    Assessment/Plan:   AKI/CKD stage IV - likely hemodynamically mediated in setting of acute systolic CHF exacerbation.  Scr had been slowly improving back to her baseline but then started climbing again.  Lasix dose has been decreased and finally stopped 06/02/22 (but she did get am dose).   Scr not significantly changed over the past 5 days.  UOP starting to decline.  Started NS at 50 ml/hr 06/05/22 but now with SOB.  Will stop IVF's despite that she is  orthostatic and is 13 liters negative since admission. No indication for dialysis at this time.  Pt is a marginal candidate given her poor functional status. Avoid nephrotoxic medications including NSAIDs and iodinated intravenous contrast exposure unless the latter is absolutely indicated.   Preferred narcotic agents for pain control are hydromorphone, fentanyl, and methadone. Morphine should not be used. Avoid Baclofen and avoid oral sodium phosphate and magnesium citrate based laxatives / bowel preps.  Continue strict Input and Output monitoring. Will monitor the patient closely with you and intervene or adjust therapy as indicated by changes in clinical status/labs Acute on chronic systolic CHF - stable, improved volume status.   Recurrent right pleural effusion - s/p thoracentesis on 5/24 and again 05/28/22 with improved breathing. Subacute stroke - presented with dizziness and seen by MRI on 05/26/22 to left inferior basal ganglia.  Neurology consulted. Orthostasis - lasix discontinued and continue to work with PT/OT.  Still had episode of near syncope upon sitting up.  concerning for psycogenic in nature.  Have ordered TED hose, abdominal binder, low dose midodrine to see if this will help.  May need to alter hydralazine dose as well since it can cause orthostatic hypotension.  Chronic cholecystitis - per IR. Deconditioning - unable to work with PT due to orthostasis.  Difficult situation as she is not getting better on multiple levels.   Donetta Potts, MD [facility]

## 2022-06-06 NOTE — Progress Notes (Signed)
PROGRESS NOTE    Chinelo D Clegg  TTS:[id number] DOB: March 16, 1951 DOA: 05/19/2022 PCP: Coolidge Breeze, FNP   Brief Narrative:  Tracey Bryant is a 71 y.o. female with PMH cholecystitis with cholecystostomy tube in place (initially placed 03/19/2021), afib, HTN, DMII, sCHF, CVA who presented with shortness of breath.  She also had reported increased lower extremity swelling/overload with no improvement on her home Lasix.   She underwent thoracentesis on 05/19/2022 removing 1.3 L.  Initially Lasix was held after developing some dizziness.Respiratory status improved after thoracentesis.     She also was evaluated by IR due to difficulty with cholecystostomy tube drainage.  Tube was evaluated and noted intact however suture had been removed but tube had not been retracted any.  After forceful flushing, biliary output was achieved with no further issues.   Due to ongoing dizziness, MRI brain was obtained on 5/30 positive for subacute infarct.  Neurology consulted.   6/2: repeat thoracentesis with IR, 900 cc drained, symptomatic improvement, beyond 05/28/2022, details as below.    Assessment & Plan:   Principal Problem:   Acute on chronic combined systolic and diastolic congestive heart failure (HCC) Active Problems:   Acute renal failure superimposed on stage 4 chronic kidney disease (HCC)   Dizziness   Cholecystitis, chronic   Acute respiratory failure with hypoxia (HCC)   Physical deconditioning   Hypertension   Uncontrolled type 2 diabetes mellitus with hyperglycemia, with long-term current use of insulin (HCC)   Paroxysmal atrial fibrillation (HCC)   History of CVA (cerebrovascular accident)  Acute on chronic combined systolic and diastolic congestive heart failure (HCC)/Acute respiratory failure with hypoxia: Underwent thoracentesis on 5/24 with removal of 1.3 L with symptomatic improvement. Initially Lasix held due to worsening renal function, dizziness however continued to have  volume overload with persisting lower extremity edema.  Lasix resumed.  She underwent repeat thoracentesis on 6/2 and removal of 900 cc fluid with notable symptomatic improvement again.  EF on most recent echo notably 50-55%, grade II diastolic heart failure. Suspected pt gained fluid over a significant amount of time.  -Cardiology signed off, nephrology following, appreciate recommendations, negative balance of 13.6 L.  Patient was noted to have "episodes " which could be possibly orthostatic tremors on the morning of 06/02/2022.  Discussed with nephrology, she clinically also appeared dry, Lasix was stopped after she received a.m. dose that day.  Further details as below. Continue following medications. -coreg 6.25 mg BID -hydral 50 q8h, imdur 15 qD -holding sglt2i   Moderate to large right-sided pleural effusion Initial thoracentesis on 5/24. CXR on 5/31 showed moderate right and small left pleural effusion, ordered right-sided ultrasound guided thoracentesis, completed 6/2.    E. coli UTI Urine culture showed more than 100,000 colonies of E. Coli, resistant to ampicillin.  -IV Rocephin 6/1-6/2, transitioned to PO keflex 500 mg BID, last dose on 06/01/2022.   Acute kidney injury superimposed on stage 3b-4 CKD (HCC) Baseline creatinine around 2.0.  Underlying CKD likely related to hypertension, DM, came in with slightly worsened creatinine in context of acute on chronic systolic CHF exacerbation, diuresis, ATN, creatinine peaked at 3.24 on 05/27/2022, then improved but then started to climb up again, peaked again at 3.11 and somewhat improved today at 3.01.  She was started on IV fluids yesterday but now she is complaining of shortness of breath although lungs are clear to auscultation, nephrology is stopping the fluids.  Management per nephrology.    Dizziness/syncope, subacute CVA prior history of  CVA MRI brain showed probable small subacute infarct of the inferior left basal ganglia.  Chronic  infarcts and chronic microvascular ischemic changes.  Patient declined MRA. -Carotid Dopplers unremarkable -2D echo showed EF 50 to 55%, grade 2 DD, no shunt. -LDL 52, hemoglobin A1c 9.0. -Seen by neurology.  Continue eliquis per their recommendations.   Cholecystitis, chronic -Diagnosed with acute cholecystitis in 02/2021, underwent cholecystostomy tube placement on 03/19/2021. Tube output had decreased admission, improved with flushing with radiology.  Cholecystostomy tube was replaced by IR on 05/31/2022.   Uncontrolled type 2 diabetes mellitus with hyperglycemia, with long-term current use of insulin (HCC) -Hemoglobin A1c 9.0.  Currently on Semglee 15 units, NovoLog 3 units 3 times daily premeals and SSI, blood sugar fairly controlled.   Paroxysmal atrial fibrillation (HCC) -Rate controlled, continue Coreg, -Continue eliquis   chronic anemia, is a Jehovah's witness, would not want transfusion; no active bleeding noted    Hypertension -Very well controlled, continue Coreg, Imdur, hydralazine   Rash on the back - ?  Contact dermatitis, placed on hydrocortisone ointment   Obesity Estimated body mass index is 31.78 kg/m as calculated from the following:   Height as of this encounter: '5\' 6"'$  (1.676 m).   Weight as of this encounter: 89.3 kg. Weight loss counseled.  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 Pt immediately has shaking of trunk, UEs and LEs with rolling, side to sit, any movement.  Pt falls back and gets a glazed look on her face.  I personally examined the patient.  When we tried to set her up with the help of the nurse, patient would have trunk and upper extremity as well as head and neck tremors and she will fall right back, she will stare as if she is postictal and then she will become normal within a matter of few seconds.  Discussed with neurology, this type  of tremors are unlikely to be seizure or vestibular.  After discussion with the nurse who actually was her nurse 2 weeks ago, patient was having those sort of episodes even 2 weeks ago but only when he standing up and now she is having these episodes even with sitting up.  Nurses as well as PT has notified at multiple times that patient does not act like that when she is distracted.  This seems more likely psychogenic.  Nonetheless, she was orthostatic positive with a drop of diastolic blood pressure of at least 30 degrees when this happened on 06/05/2022 for which nephrology started on IV fluids.  Her symptoms are not improving, likely due to being psychogenic in nature.  Difficult situation.  Nephrology stopping fluids now that she is complaining of shortness of breath.  DVT prophylaxis: Place TED hose Start: 06/04/22 1148  Eliquis   Code Status: Full Code  Family Communication:  None present at bedside.  Plan of care discussed with patient in length and he/she verbalized understanding and agreed with it.  Status is: Inpatient Remains inpatient appropriate because: Patient has multiple issues which are not improving.   Estimated body mass index is 28.15 kg/m as calculated from the following:   Height as of this encounter: '5\' 6"'$  (1.676 m).   Weight as of this encounter: 79.1 kg.    Nutritional Assessment: Body mass index is 28.15 kg/m.Marland Kitchen Seen by dietician.  I agree with the assessment and plan as outlined below: Nutrition Status:  Nutrition Problem: Increased nutrient needs Etiology: chronic illness (CHF) Signs/Symptoms: estimated needs Interventions: MVI, Glucerna shake  . Skin Assessment: I have examined the patient's skin and I agree with the wound assessment as performed by the wound care RN as outlined below:    Consultants:  Nephrology Neurology-signed off Cardiology  Procedures:  None  Antimicrobials:  Anti-infectives (From admission, onward)    Start     Dose/Rate  Route Frequency Ordered Stop   05/29/22 1345  cephALEXin (KEFLEX) capsule 500 mg        500 mg Oral Every 12 hours 05/29/22 1258 06/01/22 0959   05/27/22 1400  cefTRIAXone (ROCEPHIN) 1 g in sodium chloride 0.9 % 100 mL IVPB  Status:  Discontinued        1 g 200 mL/hr over 30 Minutes Intravenous Every 24 hours 05/27/22 1256 05/29/22 1258         Subjective:  Patient seen and examined.  She complains of weakness, more than yesterday as well as some shortness of breath today.  Objective: Vitals:   06/05/22 1625 06/05/22 2121 06/06/22 0519 06/06/22 0800  BP: (!) 130/57 136/64 (!) 142/58 (!) 125/57  Pulse: 60 60 65   Resp: '20 20 20   '$ Temp:  98.1 F (36.7 C) 98.2 F (36.8 C)   TempSrc:  Oral Oral   SpO2:  95% 92%   Weight:   79.1 kg   Height:        Intake/Output Summary (Last 24 hours) at 06/06/2022 1139 Last data filed at 06/06/2022 0600 Gross per 24 hour  Intake 516.67 ml  Output 1390 ml  Net -873.33 ml    Filed Weights   06/04/22 0404 06/05/22 0401 06/06/22 0519  Weight: 79 kg 79.6 kg 79.1 kg    Examination:  General exam: Appears calm and comfortable  Respiratory system: Clear to auscultation. Respiratory effort normal. Cardiovascular system: S1 & S2 heard, RRR. No JVD, murmurs, rubs, gallops or clicks. No pedal edema. Gastrointestinal system: Abdomen is nondistended, soft and nontender. No organomegaly or masses felt. Normal bowel sounds heard. Central nervous system: Alert and oriented. No focal neurological deficits. Extremities: Symmetric 5 x 5 power. Skin: No rashes, lesions or ulcers.  Psychiatry: Judgement and insight appear poor. Mood & affect appropriate.   Data Reviewed: I have personally reviewed following labs and imaging studies  CBC: Recent Labs  Lab 06/04/22 0342  WBC 8.3  HGB 10.6*  HCT 32.7*  MCV 88.4  PLT 130    Basic Metabolic Panel: Recent Labs  Lab 06/02/22 0402 06/03/22 0315 06/04/22 0342 06/05/22 0220 06/06/22 0335  NA 137  135 134* 135 138  K 3.6 4.0 4.2 4.2 3.7  CL 93* 95* 93* 97* 99  CO2 31 32 '27 30 29  '$ GLUCOSE 84 124* 124* 145* 127*  BUN 48* 46* 53* 50* 47*  CREATININE 3.01* 3.11* 2.98* 3.12* 3.01*  CALCIUM 8.4* 8.3* 8.3* 8.2* 8.3*  PHOS 4.8* 5.3* 4.9* 5.4* 4.9*    GFR: Estimated Creatinine Clearance: 18.4 mL/min (A) (by C-G formula based on SCr of 3.01 mg/dL (H)). Liver Function Tests: Recent Labs  Lab 06/02/22 0402 06/03/22 0315 06/04/22 0342 06/05/22 0220 06/06/22 0335  ALBUMIN 2.3* 2.1* 2.3* 2.4* 2.3*    No results for input(s): "LIPASE", "AMYLASE" in the last 168 hours. No results for input(s): "AMMONIA" in the last 168 hours. Coagulation Profile: No results for input(s): "INR", "PROTIME" in the last 168 hours. Cardiac Enzymes: No results for input(s): "CKTOTAL", "CKMB", "CKMBINDEX", "TROPONINI"  in the last 168 hours. BNP (last 3 results) Recent Labs    05/04/22 1625  PROBNP 15,044*    HbA1C: No results for input(s): "HGBA1C" in the last 72 hours. CBG: Recent Labs  Lab 06/05/22 0608 06/05/22 1107 06/05/22 1554 06/05/22 2102 06/06/22 0611  GLUCAP 113* 179* 165* 142* 141*    Lipid Profile: No results for input(s): "CHOL", "HDL", "LDLCALC", "TRIG", "CHOLHDL", "LDLDIRECT" in the last 72 hours. Thyroid Function Tests: No results for input(s): "TSH", "T4TOTAL", "FREET4", "T3FREE", "THYROIDAB" in the last 72 hours. Anemia Panel: No results for input(s): "VITAMINB12", "FOLATE", "FERRITIN", "TIBC", "IRON", "RETICCTPCT" in the last 72 hours.  Sepsis Labs: No results for input(s): "PROCALCITON", "LATICACIDVEN" in the last 168 hours.  No results found for this or any previous visit (from the past 240 hour(s)).     Radiology Studies: No results found.  Scheduled Meds:  apixaban  5 mg Oral BID   carvedilol  3.125 mg Oral BID WC   docusate sodium  100 mg Oral BID   feeding supplement (GLUCERNA SHAKE)  237 mL Oral TID BM   hydrALAZINE  50 mg Oral Q8H   hydrocortisone  cream   Topical BID   insulin aspart  0-15 Units Subcutaneous TID WC   insulin aspart  0-5 Units Subcutaneous QHS   insulin aspart  3 Units Subcutaneous TID WC   insulin glargine-yfgn  15 Units Subcutaneous Daily   isosorbide mononitrate  15 mg Oral Daily   midodrine  2.5 mg Oral TID WC   multivitamin with minerals  1 tablet Oral Daily   sodium chloride flush  3 mL Intravenous Q12H   sodium chloride flush  5 mL Intracatheter Q8H   Continuous Infusions:  sodium chloride 5 mL/hr at 05/29/22 2126   sodium chloride 50 mL/hr at 06/06/22 0851     LOS: 17 days   Darliss Cheney, MD [facility]  06/06/2022, 11:39 AM   *Please note that this is a verbal dictation therefore any spelling or grammatical errors are due to the "[place] [employer]" system interpretation.  Please page via [place] and do not message via secure chat for urgent patient care matters. Secure chat can be used for non urgent patient care matters.  How to contact the [facility] Attending or Consulting provider [place] or covering provider during after hours [place], for this patient?  Check the care team in [facility] and look for a) attending/consulting [facility] provider listed and b) the [facility] team listed. Page or secure chat [phone]. Log into [email or web address] and use [place]'s universal password to access. If you do not have the password, please contact the hospital operator. Locate the [facility] provider you are looking for under [facility] and page to a number that you can be directly reached. If you still have difficulty reaching the provider, please page the [facility] (Director on Call) for the Hospitalists listed on amion for assistance.

## 2022-06-07 DIAGNOSIS — I5043 Acute on chronic combined systolic (congestive) and diastolic (congestive) heart failure: Secondary | ICD-10-CM | POA: Diagnosis not present

## 2022-06-07 LAB — GLUCOSE, CAPILLARY
Glucose-Capillary: 82 mg/dL (ref 70–99)
Glucose-Capillary: 92 mg/dL (ref 70–99)
Glucose-Capillary: 173 mg/dL — ABNORMAL HIGH (ref 70–99)
Glucose-Capillary: 153 mg/dL — ABNORMAL HIGH (ref 70–99)

## 2022-06-07 LAB — CBC
HCT: 32.6 % — ABNORMAL LOW (ref 36.0–46.0)
Hemoglobin: 10.2 g/dL — ABNORMAL LOW (ref 12.0–15.0)
MCH: 27.9 pg (ref 26.0–34.0)
MCHC: 31.3 g/dL (ref 30.0–36.0)
MCV: 89.1 fL (ref 80.0–100.0)
Platelets: 392 10*3/uL (ref 150–400)
RBC: 3.66 MIL/uL — ABNORMAL LOW (ref 3.87–5.11)
RDW: 15.9 % — ABNORMAL HIGH (ref 11.5–15.5)
WBC: 7.2 10*3/uL (ref 4.0–10.5)
nRBC: 0 % (ref 0.0–0.2)

## 2022-06-07 LAB — RENAL FUNCTION PANEL
Albumin: 2.3 g/dL — ABNORMAL LOW (ref 3.5–5.0)
Anion gap: 7 (ref 5–15)
BUN: 44 mg/dL — ABNORMAL HIGH (ref 8–23)
CO2: 28 mmol/L (ref 22–32)
Calcium: 8 mg/dL — ABNORMAL LOW (ref 8.9–10.3)
Chloride: 101 mmol/L (ref 98–111)
Creatinine, Ser: 2.84 mg/dL — ABNORMAL HIGH (ref 0.44–1.00)
GFR, Estimated: 17 mL/min — ABNORMAL LOW (ref >60)
Glucose, Bld: 80 mg/dL (ref 70–99)
Phosphorus: 4.5 mg/dL (ref 2.5–4.6)
Potassium: 3.9 mmol/L (ref 3.5–5.1)
Sodium: 136 mmol/L (ref 135–145)

## 2022-06-07 NOTE — Progress Notes (Signed)
PROGRESS NOTE    Tracey Bryant  CXK:[id number] DOB: 1951-04-27 DOA: 05/19/2022 PCP: Coolidge Breeze, FNP   Brief Narrative:  Tracey Bryant is a 71 y.o. female with PMH cholecystitis with cholecystostomy tube in place (initially placed 03/19/2021), afib, HTN, DMII, sCHF, CVA who presented with shortness of breath.  She also had reported increased lower extremity swelling/overload with no improvement on her home Lasix.   She underwent thoracentesis on 05/19/2022 removing 1.3 L.  Initially Lasix was held after developing some dizziness.Respiratory status improved after thoracentesis.     She also was evaluated by IR due to difficulty with cholecystostomy tube drainage.  Tube was evaluated and noted intact however suture had been removed but tube had not been retracted any.  After forceful flushing, biliary output was achieved with no further issues.   Due to ongoing dizziness, MRI brain was obtained on 5/30 positive for subacute infarct.  Neurology consulted.   6/2: repeat thoracentesis with IR, 900 cc drained, symptomatic improvement, beyond 05/28/2022, details as below.    Assessment & Plan:   Principal Problem:   Acute on chronic combined systolic and diastolic congestive heart failure (HCC) Active Problems:   Acute renal failure superimposed on stage 4 chronic kidney disease (HCC)   Dizziness   Cholecystitis, chronic   Acute respiratory failure with hypoxia (HCC)   Physical deconditioning   Hypertension   Uncontrolled type 2 diabetes mellitus with hyperglycemia, with long-term current use of insulin (HCC)   Paroxysmal atrial fibrillation (HCC)   History of CVA (cerebrovascular accident)  Acute on chronic combined systolic and diastolic congestive heart failure (HCC)/Acute respiratory failure with hypoxia: Underwent thoracentesis on 5/24 with removal of 1.3 L with symptomatic improvement. Initially Lasix held due to worsening renal function,however she continued to have volume  overload with persisting lower extremity edema.  Lasix resumed.  She underwent repeat thoracentesis on 6/2 and removal of 900 cc fluid with notable symptomatic improvement again.  EF on most recent echo notably 50-55%, grade II diastolic heart failure. Suspected pt gained fluid over a significant amount of time.  -Cardiology signed off, nephrology following, appreciate recommendations, negative balance of 14.1 L.  Patient was noted to have "episodes " on the morning of 06/02/2022, which could be possibly orthostatic tremors..  Discussed with nephrology, she clinically also appeared dry, Lasix was stopped after she received a.m. dose that day.  Further details as below. Continue following medications. -coreg 6.25 mg BID -hydral 50 q8h, imdur 15 qD -holding sglt2i   Moderate to large right-sided pleural effusion Initial thoracentesis on 5/24. CXR on 5/31 showed moderate right and small left pleural effusion, ordered right-sided ultrasound guided thoracentesis, completed 6/2.    E. coli UTI Urine culture showed more than 100,000 colonies of E. Coli, resistant to ampicillin.  -IV Rocephin 6/1-6/2, transitioned to PO keflex 500 mg BID, last dose on 06/01/2022.   Acute kidney injury superimposed on stage 3b-4 CKD (HCC) Baseline creatinine around 2.0.  Underlying CKD likely related to hypertension, DM, came in with slightly worsened creatinine in context of acute on chronic systolic CHF exacerbation, diuresis, ATN, creatinine peaked at 3.24 on 05/27/2022, then improved but then started to climb up again, peaked again at 3.11 and somewhat improved today at 3.01.  She was started on gentle IV fluids on 1623 which were discontinued on 06/06/2022 as she was complaining of shortness of breath.  Creatinine is improving.  Nephrology managing.  Dizziness/syncope, subacute CVA prior history of CVA MRI brain showed probable  small subacute infarct of the inferior left basal ganglia.  Chronic infarcts and chronic  microvascular ischemic changes.  Patient declined MRA. -Carotid Dopplers unremarkable -2D echo showed EF 50 to 55%, grade 2 DD, no shunt. -LDL 52, hemoglobin A1c 9.0. -Seen by neurology.  Continue eliquis per their recommendations.   Cholecystitis, chronic -Diagnosed with acute cholecystitis in 02/2021, underwent cholecystostomy tube placement on 03/19/2021. Tube output had decreased admission, improved with flushing with radiology.  Cholecystostomy tube was replaced by IR on 05/31/2022.   Uncontrolled type 2 diabetes mellitus with hyperglycemia, with long-term current use of insulin (HCC) -Hemoglobin A1c 9.0.  Currently on Semglee 15 units, NovoLog 3 units 3 times daily premeals and SSI, blood sugar fairly controlled.   Paroxysmal atrial fibrillation (HCC) -Rate controlled, continue Coreg, -Continue eliquis   chronic anemia, is a Jehovah's witness, would not want transfusion; no active bleeding noted    Hypertension -Very well controlled, continue Coreg, Imdur, hydralazine   Rash on the back - ?  Contact dermatitis, placed on hydrocortisone ointment   Obesity Estimated body mass index is 31.78 kg/m as calculated from the following:   Height as of this encounter: '5\' 6"'$  (1.676 m).   Weight as of this encounter: 89.3 kg. Weight loss counseled.  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 Pt immediately has shaking of trunk, UEs and LEs with rolling, side to sit, any movement.  Pt falls back and gets a glazed look on her face.  I personally examined the patient.  When we tried to set her up with the help of the nurse, patient would have trunk and upper extremity as well as head and neck tremors and she will fall right back, she will stare as if she is postictal and then she will become normal within a matter of few seconds.  Discussed with neurology, this type of tremors are  unlikely to be seizure or vestibular.  After discussion with the nurse who actually was her nurse 2 weeks ago, patient was having those sort of episodes even 2 weeks ago but only when he standing up and now she is having these episodes even with sitting up.  Nurses as well as PT has notified at multiple times that patient does not act like that when she is distracted.  This seems more likely psychogenic.  Nonetheless, she was orthostatic positive with a drop of diastolic blood pressure of at least 30 degrees when this happened on 06/05/2022 for which nephrology started on IV fluids.  Today for the first time, when I sat her up with the help of the nurse, patient did not have those " episodes" and we were with the patient for about 5 minutes.  Her blood pressure was also fairly normal.  Having said that, I had motivated the patient and reassured her that she should not be having those episodes anymore.  Not sure if that worked as a psychotherapy for her.  Nonetheless, she has improved now, I have asked primary RN to reach out to the PT so they can work with her today.  If she does well with the walking, she should be able to discharge to SNF in next 1 to 2 days.  DVT prophylaxis: Place TED hose Start: 06/04/22 1148  Eliquis   Code Status: Full Code  Family Communication:  None present at bedside.  Plan of care discussed with patient in  length and he/she verbalized understanding and agreed with it.  Status is: Inpatient Remains inpatient appropriate because: Getting ready for discharge in next 1 to 2 days.   Estimated body mass index is 29.14 kg/m as calculated from the following:   Height as of this encounter: '5\' 6"'$  (1.676 m).   Weight as of this encounter: 81.9 kg.    Nutritional Assessment: Body mass index is 29.14 kg/m.Marland Kitchen Seen by dietician.  I agree with the assessment and plan as outlined below: Nutrition Status: Nutrition Problem: Increased nutrient needs Etiology: chronic illness  (CHF) Signs/Symptoms: estimated needs Interventions: MVI, Glucerna shake  . Skin Assessment: I have examined the patient's skin and I agree with the wound assessment as performed by the wound care RN as outlined below:    Consultants:  Nephrology Neurology-signed off Cardiology  Procedures:  None  Antimicrobials:  Anti-infectives (From admission, onward)    Start     Dose/Rate Route Frequency Ordered Stop   05/29/22 1345  cephALEXin (KEFLEX) capsule 500 mg        500 mg Oral Every 12 hours 05/29/22 1258 06/01/22 0959   05/27/22 1400  cefTRIAXone (ROCEPHIN) 1 g in sodium chloride 0.9 % 100 mL IVPB  Status:  Discontinued        1 g 200 mL/hr over 30 Minutes Intravenous Every 24 hours 05/27/22 1256 05/29/22 1258         Subjective:  Seen and examined with the nurse at the bedside.  Patient was feeling better with no complaints today.  No tremors today.  Objective: Vitals:   06/06/22 1600 06/06/22 2101 06/07/22 0500 06/07/22 1006  BP: (!) 134/111 (!) 118/50 (!) 149/60 134/65  Pulse: 61 60 62 62  Resp: '18 18 18 15  '$ Temp: 97.8 F (36.6 C) 98.2 F (36.8 C) 97.7 F (36.5 C) 97.7 F (36.5 C)  TempSrc: Oral Oral Oral Oral  SpO2: 97% 98% 97% 95%  Weight:   81.9 kg   Height:        Intake/Output Summary (Last 24 hours) at 06/07/2022 1049 Last data filed at 06/07/2022 0500 Gross per 24 hour  Intake 707 ml  Output 1776 ml  Net -1069 ml    Filed Weights   06/05/22 0401 06/06/22 0519 06/07/22 0500  Weight: 79.6 kg 79.1 kg 81.9 kg    Examination:  General exam: Appears calm and comfortable  Respiratory system: Clear to auscultation. Respiratory effort normal. Cardiovascular system: S1 & S2 heard, RRR. No JVD, murmurs, rubs, gallops or clicks. No pedal edema. Gastrointestinal system: Abdomen is nondistended, soft and nontender. No organomegaly or masses felt. Normal bowel sounds heard. Central nervous system: Alert and oriented. No focal neurological  deficits. Extremities: Symmetric 5 x 5 power. Skin: No rashes, lesions or ulcers.  Psychiatry: Judgement and insight appear normal. Mood & affect appropriate.    Data Reviewed: I have personally reviewed following labs and imaging studies  CBC: Recent Labs  Lab 06/04/22 0342 06/07/22 0222  WBC 8.3 7.2  HGB 10.6* 10.2*  HCT 32.7* 32.6*  MCV 88.4 89.1  PLT 389 850    Basic Metabolic Panel: Recent Labs  Lab 06/03/22 0315 06/04/22 0342 06/05/22 0220 06/06/22 0335 06/07/22 0222  NA 135 134* 135 138 136  K 4.0 4.2 4.2 3.7 3.9  CL 95* 93* 97* 99 101  CO2 32 '27 30 29 28  '$ GLUCOSE 124* 124* 145* 127* 80  BUN 46* 53* 50* 47* 44*  CREATININE 3.11* 2.98* 3.12* 3.01* 2.84*  CALCIUM 8.3* 8.3* 8.2* 8.3* 8.0*  PHOS 5.3* 4.9* 5.4* 4.9* 4.5    GFR: Estimated Creatinine Clearance: 19.9 mL/min (A) (by C-G formula based on SCr of 2.84 mg/dL (H)). Liver Function Tests: Recent Labs  Lab 06/03/22 0315 06/04/22 0342 06/05/22 0220 06/06/22 0335 06/07/22 0222  ALBUMIN 2.1* 2.3* 2.4* 2.3* 2.3*    No results for input(s): "LIPASE", "AMYLASE" in the last 168 hours. No results for input(s): "AMMONIA" in the last 168 hours. Coagulation Profile: No results for input(s): "INR", "PROTIME" in the last 168 hours. Cardiac Enzymes: No results for input(s): "CKTOTAL", "CKMB", "CKMBINDEX", "TROPONINI" in the last 168 hours. BNP (last 3 results) Recent Labs    05/04/22 1625  PROBNP 15,044*    HbA1C: No results for input(s): "HGBA1C" in the last 72 hours. CBG: Recent Labs  Lab 06/06/22 0611 06/06/22 1148 06/06/22 1712 06/06/22 2131 06/07/22 0604  GLUCAP 141* 169* 148* 135* 82    Lipid Profile: No results for input(s): "CHOL", "HDL", "LDLCALC", "TRIG", "CHOLHDL", "LDLDIRECT" in the last 72 hours. Thyroid Function Tests: No results for input(s): "TSH", "T4TOTAL", "FREET4", "T3FREE", "THYROIDAB" in the last 72 hours. Anemia Panel: No results for input(s): "VITAMINB12", "FOLATE",  "FERRITIN", "TIBC", "IRON", "RETICCTPCT" in the last 72 hours.  Sepsis Labs: No results for input(s): "PROCALCITON", "LATICACIDVEN" in the last 168 hours.  No results found for this or any previous visit (from the past 240 hour(s)).     Radiology Studies: No results found.  Scheduled Meds:  apixaban  5 mg Oral BID   carvedilol  3.125 mg Oral BID WC   docusate sodium  100 mg Oral BID   feeding supplement (GLUCERNA SHAKE)  237 mL Oral TID BM   hydrALAZINE  50 mg Oral Q8H   hydrocortisone cream   Topical BID   insulin aspart  0-15 Units Subcutaneous TID WC   insulin aspart  0-5 Units Subcutaneous QHS   insulin aspart  3 Units Subcutaneous TID WC   insulin glargine-yfgn  15 Units Subcutaneous Daily   isosorbide mononitrate  15 mg Oral Daily   midodrine  2.5 mg Oral TID WC   multivitamin with minerals  1 tablet Oral Daily   sodium chloride flush  3 mL Intravenous Q12H   sodium chloride flush  5 mL Intracatheter Q8H   Continuous Infusions:  sodium chloride 5 mL/hr at 05/29/22 2126   sodium chloride 50 mL/hr at 06/06/22 0851     LOS: 18 days   Darliss Cheney, MD [facility]  06/07/2022, 10:49 AM   *Please note that this is a verbal dictation therefore any spelling or grammatical errors are due to the "[place] [facility]" system interpretation.  Please page via [place] and do not message via secure chat for urgent patient care matters. Secure chat can be used for non urgent patient care matters.  How to contact the [facility] Attending or Consulting provider [place] or covering provider during after hours [place], for this patient?  Check the care team in [facility] and look for a) attending/consulting TR[facility] provider listed and b) the [facility] team listed. Page or secure chat [phone]. Log into [email or web address] and use [place]'s universal password to access. If you do not have the password, please contact the hospital operator. Locate the [facility] provider you are looking for under [facility]  and page to a number that you can be directly reached. If you still have difficulty reaching the provider, please page the [facility] (Director on Call) for the Hospitalists  listed on amion for assistance.

## 2022-06-07 NOTE — Care Management Important Message (Signed)
Important Message  Patient Details  Name: Tracey Bryant MRN: [id number] Date of Birth: 08/05/1951   Medicare Important Message Given:  Yes     Shelda Altes 06/07/2022, 7:53 AM

## 2022-06-07 NOTE — Progress Notes (Signed)
Patient ID: Tracey Bryant, female   DOB: [birth year], 71 y.o.   MRN: [id number]  Assessment/Plan:   AKI/CKD stage IV - likely hemodynamically mediated in setting of acute systolic CHF exacerbation.  Scr had been slowly improving back to her baseline but then started climbing again.  Lasix dose has been decreased and finally stopped 06/02/22 (but she did get am dose).   Scr not significantly changed over the past 5 days.  UOP starting to decline.  Started NS at 50 ml/hr 06/05/22 but now with SOB.  Will stop IVF's despite that she is orthostatic and is 16 liters negative since admission. No indication for dialysis at this time.  Pt is a marginal candidate given her poor functional status. Avoid nephrotoxic medications including NSAIDs and iodinated intravenous contrast exposure unless the latter is absolutely indicated.   Preferred narcotic agents for pain control are hydromorphone, fentanyl, and methadone. Morphine should not be used. Avoid Baclofen and avoid oral sodium phosphate and magnesium citrate based laxatives / bowel preps.  Continue strict Input and Output monitoring. Will monitor the patient closely with you and intervene or adjust therapy as indicated by changes in clinical status/labs. Renal function slowly improving fortunately. Continue holding diuresis for now, already great UOP. Acute on chronic systolic CHF - stable, improved volume status.   Recurrent right pleural effusion - s/p thoracentesis on 5/24 and again 05/28/22 with improved breathing. Subacute stroke - presented with dizziness and seen by MRI on 05/26/22 to left inferior basal ganglia.  Neurology consulted. Orthostasis - lasix discontinued and continue to work with PT/OT.  Still had episode of near syncope upon sitting up.  concerning for psychogenic in nature.  Have ordered TED hose, abdominal binder, low dose midodrine to see if this will help.  May need to alter hydralazine dose as well since it can cause orthostatic hypotension.   Chronic cholecystitis - per IR. Deconditioning - unable to work with PT due to orthostasis.  Difficult situation as she is not getting better on multiple levels.    S: No events overnight. UOP 1200 /24 hr. Felt better this am after help sitting up in bed. Great appetite.  O:BP (!) 149/60 (BP Location: Left Arm)   Pulse 62   Temp 97.7 F (36.5 C) (Oral)   Resp 18   Ht '5\' 6"'$  (1.676 m)   Wt 81.9 kg   SpO2 97%   BMI 29.14 kg/m   Intake/Output Summary (Last 24 hours) at 06/07/2022 0917 Last data filed at 06/07/2022 0500 Gross per 24 hour  Intake 707 ml  Output 1776 ml  Net -1069 ml   Intake/Output: I/O last 3 completed shifts: In: 793 [P.O.:937; Other:15] Out: 2426 [Urine:1450; Drains:975; Stool:1]  Intake/Output this shift:  No intake/output data recorded. Weight change: 2.8 kg Gen:NAD CVS: RRR Resp:CTA Abd: +BS, soft, NT/ND Ext: trace presacral edema GU: purewick  Recent Labs  Lab 06/01/22 0442 06/02/22 0402 06/03/22 0315 06/04/22 0342 06/05/22 0220 06/06/22 0335 06/07/22 0222  NA 138 137 135 134* 135 138 136  K 3.8 3.6 4.0 4.2 4.2 3.7 3.9  CL 97* 93* 95* 93* 97* 99 101  CO2 33* 31 32 '27 30 29 28  '$ GLUCOSE 89 84 124* 124* 145* 127* 80  BUN 48* 48* 46* 53* 50* 47* 44*  CREATININE 2.94* 3.01* 3.11* 2.98* 3.12* 3.01* 2.84*  ALBUMIN 2.3* 2.3* 2.1* 2.3* 2.4* 2.3* 2.3*  CALCIUM 8.4* 8.4* 8.3* 8.3* 8.2* 8.3* 8.0*  PHOS 4.4 4.8* 5.3* 4.9* 5.4* 4.9* 4.5  Liver Function Tests: Recent Labs  Lab 06/05/22 0220 06/06/22 0335 06/07/22 0222  ALBUMIN 2.4* 2.3* 2.3*   No results for input(s): "LIPASE", "AMYLASE" in the last 168 hours. No results for input(s): "AMMONIA" in the last 168 hours. CBC: Recent Labs  Lab 06/04/22 0342 06/07/22 0222  WBC 8.3 7.2  HGB 10.6* 10.2*  HCT 32.7* 32.6*  MCV 88.4 89.1  PLT 389 392   Cardiac Enzymes: No results for input(s): "CKTOTAL", "CKMB", "CKMBINDEX", "TROPONINI" in the last 168 hours. CBG: Recent Labs  Lab  06/06/22 0611 06/06/22 1148 06/06/22 1712 06/06/22 2131 06/07/22 0604  GLUCAP 141* 169* 148* 135* 82    Iron Studies: No results for input(s): "IRON", "TIBC", "TRANSFERRIN", "FERRITIN" in the last 72 hours. Studies/Results: No results found.  apixaban  5 mg Oral BID   carvedilol  3.125 mg Oral BID WC   docusate sodium  100 mg Oral BID   feeding supplement (GLUCERNA SHAKE)  237 mL Oral TID BM   hydrALAZINE  50 mg Oral Q8H   hydrocortisone cream   Topical BID   insulin aspart  0-15 Units Subcutaneous TID WC   insulin aspart  0-5 Units Subcutaneous QHS   insulin aspart  3 Units Subcutaneous TID WC   insulin glargine-yfgn  15 Units Subcutaneous Daily   isosorbide mononitrate  15 mg Oral Daily   midodrine  2.5 mg Oral TID WC   multivitamin with minerals  1 tablet Oral Daily   sodium chloride flush  3 mL Intravenous Q12H   sodium chloride flush  5 mL Intracatheter Q8H    BMET    Component Value Date/Time   NA 136 06/07/2022 0222   NA 141 05/04/2022 1625   K 3.9 06/07/2022 0222   CL 101 06/07/2022 0222   CO2 28 06/07/2022 0222   GLUCOSE 80 06/07/2022 0222   BUN 44 (H) 06/07/2022 0222   BUN 18 05/04/2022 1625   CREATININE 2.84 (H) 06/07/2022 0222   CALCIUM 8.0 (L) 06/07/2022 0222   GFRNONAA 17 (L) 06/07/2022 0222   GFRAA >90 01/17/2013 1915   CBC    Component Value Date/Time   WBC 7.2 06/07/2022 0222   RBC 3.66 (L) 06/07/2022 0222   HGB 10.2 (L) 06/07/2022 0222   HCT 32.6 (L) 06/07/2022 0222   PLT 392 06/07/2022 0222   MCV 89.1 06/07/2022 0222   MCH 27.9 06/07/2022 0222   MCHC 31.3 06/07/2022 0222   RDW 15.9 (H) 06/07/2022 0222   LYMPHSABS 1.2 05/27/2022 0402   MONOABS 0.6 05/27/2022 0402   EOSABS 0.4 05/27/2022 0402   BASOSABS 0.0 05/27/2022 0402

## 2022-06-07 NOTE — TOC Progression Note (Signed)
Transition of Care [facility]) - Progression Note    Patient Details  Name: Tracey Bryant MRN: [id number] Date of Birth: [birth year]  Transition of Care [facility]) CM/SW [place], [place] Phone Number: 06/07/2022, 11:24 AM  Clinical Narrative:     Sent message to [facility] to determine authorization dates- may need to return auth- Waiting on response  Expected Discharge Plan: [place] Barriers to Discharge: Insurance Authorization  Expected Discharge Plan and Services Expected Discharge Plan: [place]   Discharge Planning Services: CM Consult   Living arrangements for the past 2 months: Single Family Home                           HH Arranged: Refused [facility]           Social Determinants of Health (SDOH) Interventions    Readmission Risk Interventions    12/27/2021    2:32 PM 03/19/2021    9:34 AM 03/17/2021   11:56 AM  Readmission Risk Prevention Plan  Transportation Screening Complete  Complete  PCP or Specialist Appt within 5-7 Days  Complete   PCP or Specialist Appt within 3-5 Days Complete    Home Care Screening  Complete   Medication Review (RN CM)   Complete  HRI or Home Care Consult Complete    Social Work Consult for [place] Planning/Counseling Complete    Palliative Care Screening Not Applicable    Medication Review Press photographer) Complete

## 2022-06-07 NOTE — Plan of Care (Signed)
  Problem: Elimination: Goal: Will not experience complications related to bowel motility Outcome: Completed/Met Goal: Will not experience complications related to urinary retention Outcome: Completed/Met

## 2022-06-07 NOTE — TOC Progression Note (Signed)
Transition of Care [facility]) - Progression Note    Patient Details  Name: Tracey Bryant MRN: [id number] Date of Birth: [birth year]  Transition of Care [facility]) CM/SW [place], [place] Phone Number: 06/07/2022, 4:31 PM  Clinical Narrative:     Patient insurance has expired- [facility] has restarted insurance authorization.   Thurmond Butts, MSW, LCSW Clinical Social Worker    Expected Discharge Plan: Skilled Nursing Facility Barriers to Discharge: Insurance Authorization  Expected Discharge Plan and Services Expected Discharge Plan: [place]   Discharge Planning Services: CM Consult   Living arrangements for the past 2 months: Single Family Home                           HH Arranged: Refused [facility]           Social Determinants of Health (SDOH) Interventions    Readmission Risk Interventions    12/27/2021    2:32 PM 03/19/2021    9:34 AM 03/17/2021   11:56 AM  Readmission Risk Prevention Plan  Transportation Screening Complete  Complete  PCP or Specialist Appt within 5-7 Days  Complete   PCP or Specialist Appt within 3-5 Days Complete    Home Care Screening  Complete   Medication Review (RN CM)   Complete  HRI or Home Care Consult Complete    Social Work Consult for [place] Planning/Counseling Complete    Palliative Care Screening Not Applicable    Medication Review Press photographer) Complete

## 2022-06-07 NOTE — Plan of Care (Signed)
?  Problem: Activity: ?Goal: Capacity to carry out activities will improve ?Outcome: Progressing ?  ?

## 2022-06-07 NOTE — Progress Notes (Signed)
Physical Therapy Treatment Patient Details Name: Edit D Kamer MRN: [id number] DOB: 21-Jun-1951 Today's Date: 06/07/2022   History of Present Illness The pt is a 71 yo female presenting from home 5/24 with SOB and BLE swelling. Found to have acute CHF exacerbation and R pleural effusion s/p thoracentesis 5/24.  Noted that pt was found to have subacute infarct left basal ganglia on 5/31 and Neuro was consulted.Underwent right-sided ultrasound guided thoracentesis on 6/2 and had R cholecystosomy catheter exchange performed in IR on 6/5.  PMH includes: arthritis, CHF, DM II, DVT, HTN, afib, and stroke.    PT Comments    Pt with improved mobility and tolerance of activity after MD reassurance that she is better. Pt continued to have some of the shaking type episodes but able to continue to mobilize. Kept pt engaged in conversation about family and cat. Explained that some weakness is to be expected after several weeks in bed and that she should continue to get better. Continue to recommend SNF since pt lives alone and is not back to independence.    Recommendations for follow up therapy are one component of a multi-disciplinary discharge planning process, led by the attending physician.  Recommendations may be updated based on patient status, additional functional criteria and insurance authorization.  Follow Up Recommendations  Skilled nursing-short term rehab (<3 hours/day)     Assistance Recommended at Discharge Frequent or constant Supervision/Assistance  Patient can return home with the following Two people to help with walking and/or transfers;Two people to help with bathing/dressing/bathroom;Assistance with cooking/housework;Direct supervision/assist for medications management;Direct supervision/assist for financial management;Assist for transportation;Help with stairs or ramp for entrance   Equipment Recommendations  None recommended by PT    Recommendations for Other Services        Precautions / Restrictions Precautions Precautions: Fall Precaution Comments: psuedo-syncopal/shaking episodes this admission, not related to BP Restrictions Weight Bearing Restrictions: No     Mobility  Bed Mobility Overal bed mobility: Needs Assistance Bed Mobility: Rolling, Sidelying to Sit Rolling: Min guard Sidelying to sit: Min guard, HOB elevated       General bed mobility comments: Incr time and effort but assist only for safety    Transfers Overall transfer level: Needs assistance Equipment used: Rolling walker (2 wheels), Rollator (4 wheels), 1 person hand held assist Transfers: Sit to/from Stand, Bed to chair/wheelchair/BSC Sit to Stand: Min assist, +2 safety/equipment   Step pivot transfers: Min assist, +2 safety/equipment       General transfer comment: Assist to bring hips up and for balance. Bed to bsc with rollator with step pivot. Chair to recliner with bilateral shelf arm support of min assist. On both transfers pt began shaking episode when almost to chair but able to complete transfer.    Ambulation/Gait Ambulation/Gait assistance: Min assist, +2 safety/equipment Gait Distance (Feet): 35 Feet (10' x 1, 25' x 1, 35' x 1) Assistive device: Rolling walker (2 wheels), Rollator (4 wheels) Gait Pattern/deviations: Step-through pattern, Decreased stride length, Decreased step length - right, Decreased step length - left, Wide base of support Gait velocity: decr Gait velocity interpretation: <1.31 ft/sec, indicative of household ambulator   General Gait Details: Initially used rollator and min assist for balance and support. Pt with some of shaking episode but able to continue toward goal of reaching door. Switched to using rolling walker for incr stability. Able to am 34' with only a couple of very brief bouts of shaking. Continued to keep pt conversing/distracted and continued. On third time pt  able to amb 25' with only 1 very brief episode of minor shaking.  Again continued to converse with patient primarily about family. Had second person following pt with rolling chair to allow pt to sit and rest and for safety.   Stairs             Wheelchair Mobility    Modified Rankin (Stroke Patients Only) Modified Rankin (Stroke Patients Only) Pre-Morbid Rankin Score: No significant disability Modified Rankin: Moderately severe disability     Balance Overall balance assessment: Needs assistance Sitting-balance support: Feet supported, No upper extremity supported Sitting balance-Leahy Scale: Fair     Standing balance support: Bilateral upper extremity supported, Reliant on assistive device for balance Standing balance-Leahy Scale: Poor Standing balance comment: walker and min assist for static standing                            Cognition Arousal/Alertness: Awake/alert Behavior During Therapy: Anxious Overall Cognitive Status: Impaired/Different from baseline Area of Impairment: Memory, Problem solving                     Memory: Decreased short-term memory       Problem Solving: Requires verbal cues          Exercises      General Comments        Pertinent Vitals/Pain Pain Assessment Pain Assessment: No/denies pain    Home Living                          Prior Function            PT Goals (current goals can now be found in the care plan section) Acute Rehab PT Goals Patient Stated Goal: return home Progress towards PT goals: Progressing toward goals    Frequency    Min 3X/week      PT Plan Current plan remains appropriate    Co-evaluation              AM-PAC PT "6 Clicks" Mobility   Outcome Measure  Help needed turning from your back to your side while in a flat bed without using bedrails?: A Little Help needed moving from lying on your back to sitting on the side of a flat bed without using bedrails?: A Little Help needed moving to and from a bed to a chair  (including a wheelchair)?: A Little Help needed standing up from a chair using your arms (e.g., wheelchair or bedside chair)?: A Little Help needed to walk in hospital room?: Total Help needed climbing 3-5 steps with a railing? : Total 6 Click Score: 14    End of Session Equipment Utilized During Treatment: Gait belt Activity Tolerance: Patient tolerated treatment well Patient left: with call bell/phone within reach;in chair;with chair alarm set Nurse Communication: Mobility status PT Visit Diagnosis: Other abnormalities of gait and mobility (R26.89);Muscle weakness (generalized) (M62.81)     Time: 7564-3329 PT Time Calculation (min) (ACUTE ONLY): 32 min  Charges:  $Gait Training: 23-37 mins                     [place] [facility] [place] 06/07/2022, 11:53 AM

## 2022-06-07 NOTE — Progress Notes (Signed)
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    Patient was recently admitted 12/25/21 - 12/27/21 for CHF exacerbation - diuresed with Lasix IV 40 mg BID with total of -4.58 L volume removed.  Echo in December 2022 showed improvement in LVEF 40-45% (25-30% in 02/2021) with global hypokinesis, normal RV, and moderately elevated pulmonary pressures.  Discharge weight was 162 lbs.  Discharged on carvedilol 6.25 mg BID and furosemide 40 mg daily and was instructed to stop amlodipine 10 mg daily and BiDIl 20-37.5 mg TID.   On 05/24, she presented to her Cardiology office thinking she had an appointment and endorsed SOB, bilateral LEE, and reduced activity tolerance; MD referred her to [facility] ED.  Patient endorsed progressive symptoms despite recent increase Lasix 40 mg daily > BID at cardiology visit 05/09 where she reported worsening LEE, SOB, orthopnea, and PND.  CXR in the ED showed moderate R pleural effusion; dual PPM evident.  Repeat CXR 6h later demonstrated improvement in R pleural effusion, cardiomegaly and no evidence of interstitial edema.  Underwent R thoracentesis with 1.3 L removed.  She was started on IV diuresis 05/24, received Lasix IV 40 mg x1 and 60 mg x2 with marginal response , but her 1+ bilateral LE pitting edema was improved.    Breathing had improved on 05/25 s/p thoracentesis and diuresis, but renal function worsened (SCr 2.13 > 2.37, baseline ~1.6) and patient reported dizziness so diuretics were held.  Echo on 05/25 showed improvement in LVEF to 50-55% and elevated LVEDP (21) with new G2DD; still with normal RV and mildly elevated pulmonary pressures.    Patient continued to endorse SOB, feeling  swollen and fatigued w/ bilateral 2+ pitting LEE and JVD over the weekend (05/27-28).  She had minimal response of 550cc to Lasix IV 40 mg BID and was given 80 mg x1; nephrology also consulted and redosed 80 mg x1 the following day after ~1.4 L uop and worsening renal function (SCr 3.0 > 3.19).    She had a near syncopal event on 05/30 while sitting on the bed to perform orthostatics, was SOB while speaking and appeared more volume overloaded on exam.  JVD and bilateral 2+ pitting edema still present - nephrology increased Lasix to 80 mg IV x2.  She was taken for a MRI of brain 05/30 which revealed a probable small subacute infarct.    Repeat CXR 5/31 showed stable cardiomegaly with moderate right and small left pleural effusions.  Considered plans for HD if she did not make an adequate amount of urine with aggressive diuresis.  S/p thoracentesis on 6/2 with 900 cc of fluid removed.  Near syncopal events continued - trial midodrine, reduce coreg, with abdominal binders and TED hose. Gentle IV fluids started but these are now off with development of shortness of breath.  Current HF Medications: Beta Blocker: carvedilol 3.125 mg BID Other: hydralazine 50 mg q8h + Imdur 15 mg daily  Prior to admission HF Medications: Diuretic: furosemide 40 mg BID Beta blocker: carvedilol 6.25 mg BID Other: hydralazine 25 mg BID + Imdur 15 mg daily; potassium 20 mEq BID  Pertinent Lab Values: Serum creatinine 2.84, BUN 44, Potassium 3.9, Sodium 136, BNP 2083.9, Ac 9.1% (up from  7.7% in Jan) TSAT 8, no ferritin level  Vital Signs: Weight: 180 lbs (admission weight: 201 lbs) Blood pressure: 140/60s Heart rate: 60s I/O: -1.3L yesterday; net -16.3L   Medication Assistance / Insurance Benefits Check: Does the patient have prescription insurance?  Yes Type of insurance plan: [name] [employer]  Does the patient qualify for medication assistance through manufacturers or grants?   Yes Eligible grants and/or  patient assistance programs: pending initiation of ARNI/SGLT2i Medication assistance applications in progress: pending  Medication assistance applications approved: pending  Outpatient Pharmacy:  Prior to admission outpatient pharmacy: [facility] Is the patient willing to use [place] pharmacy at discharge? Pending Is the patient willing to transition their outpatient pharmacy to utilize a [facility] outpatient pharmacy?   Pending    Assessment: 1. 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 NYHA class II symptoms. No JVD or SOB and minimal LEE on exam.   GDMT initiation/titration limited by poor renal function.  - Off IV lasix, weight up today - watch carefully - Continue reduced dose carvedilol 3.125 mg BID - No ACEi/ARB/ARNI or MRA with significant renal dysfunction  - No SGLT2i with h/o UTI during March-April 2022 admission, A1c up 7.7% > 9.1% since Jan, and eGFR <20. UA this admission with many bacteria, nitrite negative. UCx with >100k ecoli - treating with ceftriaxone - Continue PTA hydralazine 50 mg q8h + Imdur 15 mg daily - Also on midodrine 2.5 mg TID with syncopal events    Plan: 1) Medication changes recommended at this time: - Stop PTA amlodipine on discharge to allow for BP room with GDMT initiation/titration pending renal function improvement  2) Patient assistance: [name] = $47  3)  Education  - Patient has been educated on current HF medications and potential additions to HF medication regimen - Patient verbalizes understanding that over the next few months, these medication doses may change and more medications may be added to optimize HF regimen - Patient has been educated on basic disease state pathophysiology and goals of therapy  Kerby Nora, PharmD, BCPS Heart Failure Stewardship Pharmacist Phone [phone]  Please check [email or web address] for unit-specific pharmacist phone numbers

## 2022-06-08 DIAGNOSIS — I5043 Acute on chronic combined systolic (congestive) and diastolic (congestive) heart failure: Secondary | ICD-10-CM | POA: Diagnosis not present

## 2022-06-08 LAB — RENAL FUNCTION PANEL
Albumin: 2.3 g/dL — ABNORMAL LOW (ref 3.5–5.0)
Anion gap: 9 (ref 5–15)
BUN: 44 mg/dL — ABNORMAL HIGH (ref 8–23)
CO2: 27 mmol/L (ref 22–32)
Calcium: 8.3 mg/dL — ABNORMAL LOW (ref 8.9–10.3)
Chloride: 100 mmol/L (ref 98–111)
Creatinine, Ser: 2.66 mg/dL — ABNORMAL HIGH (ref 0.44–1.00)
GFR, Estimated: 19 mL/min — ABNORMAL LOW (ref >60)
Glucose, Bld: 122 mg/dL — ABNORMAL HIGH (ref 70–99)
Phosphorus: 4.7 mg/dL — ABNORMAL HIGH (ref 2.5–4.6)
Potassium: 4.1 mmol/L (ref 3.5–5.1)
Sodium: 136 mmol/L (ref 135–145)

## 2022-06-08 LAB — GLUCOSE, CAPILLARY
Glucose-Capillary: 116 mg/dL — ABNORMAL HIGH (ref 70–99)
Glucose-Capillary: 155 mg/dL — ABNORMAL HIGH (ref 70–99)
Glucose-Capillary: 195 mg/dL — ABNORMAL HIGH (ref 70–99)
Glucose-Capillary: 167 mg/dL — ABNORMAL HIGH (ref 70–99)

## 2022-06-08 MED ORDER — FUROSEMIDE 40 MG PO TABS
40.0000 mg | ORAL_TABLET | Freq: Two times a day (BID) | ORAL | Status: DC
  Administered 2022-06-08 – 2022-06-09 (×2): 40 mg via ORAL
  Filled 2022-06-08 (×2): qty 1

## 2022-06-08 NOTE — Progress Notes (Signed)
Patient ID: Tracey Bryant, female   DOB: 08-02-1951, 71 y.o.   MRN: [id number]  Assessment/Plan:   AKI/CKD stage IV - likely hemodynamically mediated in setting of acute systolic CHF exacerbation.  Scr had been slowly improving back to her baseline but then started climbing again.  Lasix dose has been decreased and finally stopped 06/02/22 (but she did get am dose).   Scr not significantly changed over the past 5 days.  UOP starting to decline.  Started NS at 50 ml/hr 06/05/22 but now with SOB.  Will stop IVF's despite that she is orthostatic and is 16 liters negative since admission. No indication for dialysis at this time.  Pt is a marginal candidate given her poor functional status. Avoid nephrotoxic medications including NSAIDs and iodinated intravenous contrast exposure unless the latter is absolutely indicated.   Preferred narcotic agents for pain control are hydromorphone, fentanyl, and methadone. Morphine should not be used. Avoid Baclofen and avoid oral sodium phosphate and magnesium citrate based laxatives / bowel preps.  Continue strict Input and Output monitoring. Will monitor the patient closely with you and intervene or adjust therapy as indicated by changes in clinical status/labs. Renal function continues to slowly improve.  Will restart oral diuretics.     Acute on chronic systolic CHF - stable, improved volume status. Recurrent right pleural effusion - s/p thoracentesis on 5/24 and again 05/28/22 with improved breathing. Subacute stroke - presented with dizziness and seen by MRI on 05/26/22 to left inferior basal ganglia.  Neurology consulted. Orthostasis - lasix discontinued and continue to work with PT/OT.  Still had episode of near syncope upon sitting up.  concerning for psychogenic in nature.  Have ordered TED hose, abdominal binder, low dose midodrine to see if this will help.  May need to alter hydralazine dose as well since it can cause orthostatic hypotension.  Chronic  cholecystitis - per IR. Deconditioning - unable to work with PT due to orthostasis.  Difficult situation as she is not getting better on multiple levels.    S: No events overnight. UOP 1200 /450 24 hr x2 and mildly dyspneic with exertion. Good appetite.  O:BP (!) 130/49 (BP Location: Left Arm)   Pulse 60   Temp 98 F (36.7 C) (Oral)   Resp 20   Ht '5\' 6"'$  (1.676 m)   Wt 80.5 kg   SpO2 97%   BMI 28.63 kg/m   Intake/Output Summary (Last 24 hours) at 06/08/2022 1150 Last data filed at 06/08/2022 1011 Gross per 24 hour  Intake 1209 ml  Output 1070 ml  Net 139 ml   Intake/Output: I/O last 3 completed shifts: In: 0175 [P.O.:1180; Other:15] Out: 2221 [Urine:1150; Drains:1070; Stool:1]  Intake/Output this shift:  Total I/O In: 239 [P.O.:236; I.V.:3] Out: -  Weight change: -1.432 kg Gen:NAD CVS: RRR Resp:CTA Abd: +BS, soft, NT/ND Ext: trace presacral edema GU: purewick  Recent Labs  Lab 06/02/22 0402 06/03/22 0315 06/04/22 0342 06/05/22 0220 06/06/22 0335 06/07/22 0222 06/08/22 0302  NA 137 135 134* 135 138 136 136  K 3.6 4.0 4.2 4.2 3.7 3.9 4.1  CL 93* 95* 93* 97* 99 101 100  CO2 31 32 '27 30 29 28 27  '$ GLUCOSE 84 124* 124* 145* 127* 80 122*  BUN 48* 46* 53* 50* 47* 44* 44*  CREATININE 3.01* 3.11* 2.98* 3.12* 3.01* 2.84* 2.66*  ALBUMIN 2.3* 2.1* 2.3* 2.4* 2.3* 2.3* 2.3*  CALCIUM 8.4* 8.3* 8.3* 8.2* 8.3* 8.0* 8.3*  PHOS 4.8* 5.3* 4.9* 5.4* 4.9*  4.5 4.7*   Liver Function Tests: Recent Labs  Lab 06/06/22 0335 06/07/22 0222 06/08/22 0302  ALBUMIN 2.3* 2.3* 2.3*   No results for input(s): "LIPASE", "AMYLASE" in the last 168 hours. No results for input(s): "AMMONIA" in the last 168 hours. CBC: Recent Labs  Lab 06/04/22 0342 06/07/22 0222  WBC 8.3 7.2  HGB 10.6* 10.2*  HCT 32.7* 32.6*  MCV 88.4 89.1  PLT 389 392   Cardiac Enzymes: No results for input(s): "CKTOTAL", "CKMB", "CKMBINDEX", "TROPONINI" in the last 168 hours. CBG: Recent Labs  Lab  06/07/22 1124 06/07/22 1523 06/07/22 2115 06/08/22 0626 06/08/22 1108  GLUCAP 92 173* 153* 116* 155*    Iron Studies: No results for input(s): "IRON", "TIBC", "TRANSFERRIN", "FERRITIN" in the last 72 hours. Studies/Results: No results found.  apixaban  5 mg Oral BID   carvedilol  3.125 mg Oral BID WC   docusate sodium  100 mg Oral BID   feeding supplement (GLUCERNA SHAKE)  237 mL Oral TID BM   hydrALAZINE  50 mg Oral Q8H   hydrocortisone cream   Topical BID   insulin aspart  0-15 Units Subcutaneous TID WC   insulin aspart  0-5 Units Subcutaneous QHS   insulin aspart  3 Units Subcutaneous TID WC   insulin glargine-yfgn  15 Units Subcutaneous Daily   isosorbide mononitrate  15 mg Oral Daily   midodrine  2.5 mg Oral TID WC   multivitamin with minerals  1 tablet Oral Daily   sodium chloride flush  3 mL Intravenous Q12H   sodium chloride flush  5 mL Intracatheter Q8H    BMET    Component Value Date/Time   NA 136 06/08/2022 0302   NA 141 05/04/2022 1625   K 4.1 06/08/2022 0302   CL 100 06/08/2022 0302   CO2 27 06/08/2022 0302   GLUCOSE 122 (H) 06/08/2022 0302   BUN 44 (H) 06/08/2022 0302   BUN 18 05/04/2022 1625   CREATININE 2.66 (H) 06/08/2022 0302   CALCIUM 8.3 (L) 06/08/2022 0302   GFRNONAA 19 (L) 06/08/2022 0302   GFRAA >90 01/17/2013 1915   CBC    Component Value Date/Time   WBC 7.2 06/07/2022 0222   RBC 3.66 (L) 06/07/2022 0222   HGB 10.2 (L) 06/07/2022 0222   HCT 32.6 (L) 06/07/2022 0222   PLT 392 06/07/2022 0222   MCV 89.1 06/07/2022 0222   MCH 27.9 06/07/2022 0222   MCHC 31.3 06/07/2022 0222   RDW 15.9 (H) 06/07/2022 0222   LYMPHSABS 1.2 05/27/2022 0402   MONOABS 0.6 05/27/2022 0402   EOSABS 0.4 05/27/2022 0402   BASOSABS 0.0 05/27/2022 0402

## 2022-06-08 NOTE — Progress Notes (Signed)
Mobility Specialist Progress Note:   06/08/22 1700  Mobility  Activity Transferred to/from Aaisha Sliter [facility];Transferred from chair to bed  Level of Assistance Minimal assist, patient does 75% or more (+2)  Assistive Device Front wheel walker  Distance Ambulated (ft) 4 ft  Activity Response Tolerated well  $Mobility charge 1 Mobility   Pt received on BSC needing to go back to bed. MinA +2 to stand. Left in bed with call bell in reach and all needs met.    [facility] Raeshawn Tafolla Mobility Specialist

## 2022-06-08 NOTE — Progress Notes (Signed)
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    Patient was recently admitted 12/25/21 - 12/27/21 for CHF exacerbation - diuresed with Lasix IV 40 mg BID with total of -4.58 L volume removed.  Echo in December 2022 showed improvement in LVEF 40-45% (25-30% in 02/2021) with global hypokinesis, normal RV, and moderately elevated pulmonary pressures.  Discharge weight was 162 lbs.  Discharged on carvedilol 6.25 mg BID and furosemide 40 mg daily and was instructed to stop amlodipine 10 mg daily and BiDIl 20-37.5 mg TID.   On 05/24, she presented to her Cardiology office thinking she had an appointment and endorsed SOB, bilateral LEE, and reduced activity tolerance; MD referred her to [facility] ED.  Patient endorsed progressive symptoms despite recent increase Lasix 40 mg daily > BID at cardiology visit 05/09 where she reported worsening LEE, SOB, orthopnea, and PND.  CXR in the ED showed moderate R pleural effusion; dual PPM evident.  Repeat CXR 6h later demonstrated improvement in R pleural effusion, cardiomegaly and no evidence of interstitial edema.  Underwent R thoracentesis with 1.3 L removed.  She was started on IV diuresis 05/24, received Lasix IV 40 mg x1 and 60 mg x2 with marginal response , but her 1+ bilateral LE pitting edema was improved.    Breathing had improved on 05/25 s/p thoracentesis and diuresis, but renal function worsened (SCr 2.13 > 2.37, baseline ~1.6) and patient reported dizziness so diuretics were held.  Echo on 05/25 showed improvement in LVEF to 50-55% and elevated LVEDP (21) with new G2DD; still with normal RV and mildly elevated pulmonary pressures.    Patient continued to endorse SOB, feeling  swollen and fatigued w/ bilateral 2+ pitting LEE and JVD over the weekend (05/27-28).  She had minimal response of 550cc to Lasix IV 40 mg BID and was given 80 mg x1; nephrology also consulted and redosed 80 mg x1 the following day after ~1.4 L uop and worsening renal function (SCr 3.0 > 3.19).    She had a near syncopal event on 05/30 while sitting on the bed to perform orthostatics, was SOB while speaking and appeared more volume overloaded on exam.  JVD and bilateral 2+ pitting edema still present - nephrology increased Lasix to 80 mg IV x2.  She was taken for a MRI of brain 05/30 which revealed a probable small subacute infarct.    Repeat CXR 5/31 showed stable cardiomegaly with moderate right and small left pleural effusions.  Considered plans for HD if she did not make an adequate amount of urine with aggressive diuresis.  S/p thoracentesis on 6/2 with 900 cc of fluid removed.  Near syncopal events continued - trial midodrine, reduce coreg, with abdominal binders and TED hose. Gentle IV fluids started but these are now off with development of shortness of breath. Symptoms have now improved. Pending SNF placement.  Current HF Medications: Beta Blocker: carvedilol 3.125 mg BID Other: hydralazine 50 mg q8h + Imdur 15 mg daily  Prior to admission HF Medications: Diuretic: furosemide 40 mg BID Beta blocker: carvedilol 6.25 mg BID Other: hydralazine 25 mg BID + Imdur 15 mg daily; potassium 20 mEq BID  Pertinent Lab Values: Serum creatinine 2.66, BUN 44, Potassium 4.1, Sodium  136, BNP 2083.9, Ac 9.1% (up from 7.7% in Jan) TSAT 8, no ferritin level  Vital Signs: Weight: 177 lbs (admission weight: 201 lbs) Blood pressure: 140/60s Heart rate: 60s I/O: -246m yesterday; net -16.2L   Medication Assistance / Insurance Benefits Check: Does the patient have prescription insurance?  Yes Type of insurance plan: [employer] [employer]  Does the patient qualify for medication assistance through  manufacturers or grants?   Yes Eligible grants and/or patient assistance programs: pending initiation of ARNI/SGLT2i Medication assistance applications in progress: pending  Medication assistance applications approved: pending  Outpatient Pharmacy:  Prior to admission outpatient [facility]: [facility]? Pending Is the patient willing to [facility]?   Pending    Assessment: 1. 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 NYHA class II symptoms. No JVD or SOB and minimal LEE on exam.   GDMT initiation/titration limited by poor renal function.  - Off IV lasix - watch carefully - Continue reduced dose carvedilol 3.125 mg BID - No ACEi/ARB/ARNI or MRA with significant renal dysfunction  - No SGLT2i with h/o UTI during March-April 2022 admission, A1c up 7.7% > 9.1% since Jan, and eGFR <20. UA this admission with many bacteria, nitrite negative. UCx with >100k ecoli - treating with ceftriaxone - Continue PTA hydralazine 50 mg q8h + Imdur 15 mg daily - Also on midodrine 2.5 mg TID with syncopal events    Plan: 1) Medication changes recommended at this time: - Stop PTA amlodipine on discharge to allow for BP room with GDMT initiation/titration pending renal function improvement  2) Patient assistance: -[name]= $47  3)  Education  - Patient has been educated on current HF medications and potential additions to HF medication regimen - Patient verbalizes understanding that over the next few months, these medication doses may change and more medications may be added to optimize HF regimen - Patient has been educated on basic disease state pathophysiology and goals of therapy  MKerby Nora PharmD, BCPS Heart Failure Stewardship Pharmacist Phone [phone] Please check [email or web address] for unit-specific pharmacist phone numbers

## 2022-06-08 NOTE — Progress Notes (Signed)
PROGRESS NOTE    Tracey Bryant  FWY:[id number] DOB: [birth year] DOA: 05/19/2022 PCP: Coolidge Breeze, FNP   Brief Narrative:  Tracey Bryant is a 71 y.o. female with PMH cholecystitis with cholecystostomy tube in place (initially placed 03/19/2021), afib, HTN, DMII, sCHF, CVA who presented with shortness of breath.  She also had reported increased lower extremity swelling/overload with no improvement on her home Lasix.   She underwent thoracentesis on 05/19/2022 removing 1.3 L.  Initially Lasix was held after developing some dizziness.Respiratory status improved after thoracentesis.     She also was evaluated by IR due to difficulty with cholecystostomy tube drainage.  Tube was evaluated and noted intact however suture had been removed but tube had not been retracted any.  After forceful flushing, biliary output was achieved with no further issues.   Due to ongoing dizziness, MRI brain was obtained on 5/30 positive for subacute infarct.  Neurology consulted.   6/2: repeat thoracentesis with IR, 900 cc drained, symptomatic improvement, beyond 05/28/2022, details after that as below.    Assessment & Plan:   Principal Problem:   Acute on chronic combined systolic and diastolic congestive heart failure (HCC) Active Problems:   Acute renal failure superimposed on stage 4 chronic kidney disease (HCC)   Dizziness   Cholecystitis, chronic   Acute respiratory failure with hypoxia (HCC)   Physical deconditioning   Hypertension   Uncontrolled type 2 diabetes mellitus with hyperglycemia, with long-term current use of insulin (HCC)   Paroxysmal atrial fibrillation (HCC)   History of CVA (cerebrovascular accident)  Acute on chronic combined systolic and diastolic congestive heart failure (HCC)/Acute respiratory failure with hypoxia: Underwent thoracentesis on 5/24 with removal of 1.3 L with symptomatic improvement. Initially Lasix held due to worsening renal function,however she continued to have  volume overload with persisting lower extremity edema.  Lasix resumed.  She underwent repeat thoracentesis on 6/2 and removal of 900 cc fluid with notable symptomatic improvement again.  EF on most recent echo notably 50-55%, grade II diastolic heart failure. Suspected pt gained fluid over a significant amount of time.  -Cardiology signed off, nephrology following, appreciate recommendations, negative balance of 13.3 L.  Patient was noted to have "episodes " on the morning of 06/02/2022, which could be possibly orthostatic tremors..  Discussed with nephrology, she clinically also appeared dry, Lasix was stopped after she received a.m. dose that day.  Further details as below. Continue following medications. -coreg 6.25 mg BID -hydral 50 q8h, imdur 15 qD -holding sglt2i   Moderate to large right-sided pleural effusion Initial thoracentesis on 5/24. CXR on 5/31 showed moderate right and small left pleural effusion, ordered right-sided ultrasound guided thoracentesis, completed 6/2.    E. coli UTI Urine culture showed more than 100,000 colonies of E. Coli, resistant to ampicillin.  -IV Rocephin 6/1-6/2, transitioned to PO keflex 500 mg BID, last dose on 06/01/2022.   Acute kidney injury superimposed on stage 3b-4 CKD (HCC) Baseline creatinine around 2.0.  Underlying CKD likely related to hypertension, DM, came in with slightly worsened creatinine in context of acute on chronic systolic CHF exacerbation, diuresis, ATN, creatinine peaked at 3.24 on 05/27/2022, then improved but then started to climb up again, peaked again at 3.11 and somewhat improved today at 2.66 which is very close to baseline .  She was started on gentle IV fluids on 1623 which were discontinued on 06/06/2022 as she was complaining of shortness of breath.  Creatinine is improving.  Nephrology managing.  Dizziness/syncope, subacute  CVA prior history of CVA MRI brain showed probable small subacute infarct of the inferior left basal ganglia.   Chronic infarcts and chronic microvascular ischemic changes.  Patient declined MRA. -Carotid Dopplers unremarkable -2D echo showed EF 50 to 55%, grade 2 DD, no shunt. -LDL 52, hemoglobin A1c 9.0. -Seen by neurology.  Continue eliquis per their recommendations.   Cholecystitis, chronic -Diagnosed with acute cholecystitis in 02/2021, underwent cholecystostomy tube placement on 03/19/2021. Tube output had decreased admission, improved with flushing with radiology.  Cholecystostomy tube was replaced by IR on 05/31/2022.   Uncontrolled type 2 diabetes mellitus with hyperglycemia, with long-term current use of insulin (HCC) -Hemoglobin A1c 9.0.  Currently on Semglee 15 units, NovoLog 3 units 3 times daily premeals and SSI, blood sugar fairly controlled.   Paroxysmal atrial fibrillation (HCC) -Rate controlled, continue Coreg, -Continue eliquis   chronic anemia, is a Jehovah's witness, would not want transfusion; no active bleeding noted, hemoglobin stable with   Hypertension -Very well controlled, continue Coreg, Imdur, hydralazine   Rash on the back - ?  Contact dermatitis, placed on hydrocortisone ointment, rash resolved.   Obesity Estimated body mass index is 31.78 kg/m as calculated from the following:   Height as of this encounter: '5\' 6"'$  (1.676 m).   Weight as of this encounter: 89.3 kg. Weight loss counseled.  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 Pt immediately has shaking of trunk, UEs and LEs with rolling, side to sit, any movement.  Pt falls back and gets a glazed look on her face.  I personally examined the patient.  When we tried to set her up with the help of the nurse, patient would have trunk and upper extremity as well as head and neck tremors and she will fall right back, she will stare as if she is postictal and then she will become normal within a matter of few  seconds.  Discussed with neurology, this type of tremors are unlikely to be seizure or vestibular.  After discussion with the nurse who actually was her nurse 2 weeks ago, patient was having those sort of episodes even 2 weeks ago but only when he standing up and now she is having these episodes even with sitting up.  Nurses as well as PT has notified at multiple times that patient does not act like that when she is distracted.  This seems more likely psychogenic.  Nonetheless, she was orthostatic positive with a drop of diastolic blood pressure of at least 30 degrees when this happened on 06/05/2022 for which nephrology started on IV fluids.  Yesterday for the first time, when I sat her up with the help of the nurse, patient did not have those " episodes" and we were with the patient for about 5 minutes.  Her blood pressure was also fairly normal.  Having said that, I had motivated the patient and reassured her that she should not be having those episodes anymore.  Not sure if that worked as a psychotherapy for her.  Nonetheless, she has improved now, she also worked with PT and per them, she had improved.  I think she is stable for discharge now.  TOC is working on Building surveyor authorization is pending.  DVT prophylaxis: Place TED hose Start: 06/04/22 1148  Eliquis   Code Status: Full Code  Family Communication:  None present at bedside.  Plan of care  discussed with patient in length and he/she verbalized understanding and agreed with it.  Status is: Inpatient Remains inpatient appropriate because: Medically stable, waiting for insurance authorization and SNF placement.   Estimated body mass index is 28.63 kg/m as calculated from the following:   Height as of this encounter: '5\' 6"'$  (1.676 m).   Weight as of this encounter: 80.5 kg.    Nutritional Assessment: Body mass index is 28.63 kg/m.Marland Kitchen Seen by dietician.  I agree with the assessment and plan as outlined below: Nutrition  Status: Nutrition Problem: Increased nutrient needs Etiology: chronic illness (CHF) Signs/Symptoms: estimated needs Interventions: MVI, Glucerna shake  . Skin Assessment: I have examined the patient's skin and I agree with the wound assessment as performed by the wound care RN as outlined below:    Consultants:  Nephrology Neurology-signed off Cardiology-signed off  Procedures:  As above  Antimicrobials:  Anti-infectives (From admission, onward)    Start     Dose/Rate Route Frequency Ordered Stop   05/29/22 1345  cephALEXin (KEFLEX) capsule 500 mg        500 mg Oral Every 12 hours 05/29/22 1258 06/01/22 0959   05/27/22 1400  cefTRIAXone (ROCEPHIN) 1 g in sodium chloride 0.9 % 100 mL IVPB  Status:  Discontinued        1 g 200 mL/hr over 30 Minutes Intravenous Every 24 hours 05/27/22 1256 05/29/22 1258         Subjective:  Patient seen and examined.  She was sitting in the recliner without having any complaints.  She is feeling better than yesterday she said.  Objective: Vitals:   06/07/22 1947 06/07/22 2150 06/08/22 0435 06/08/22 0636  BP: (!) 139/56 140/60 135/62 134/62  Pulse: 64  60   Resp: 18  20   Temp: 97.7 F (36.5 C)  98.2 F (36.8 C)   TempSrc: Oral  Oral   SpO2: 94%  93%   Weight:   80.5 kg   Height:        Intake/Output Summary (Last 24 hours) at 06/08/2022 1004 Last data filed at 06/08/2022 0742 Gross per 24 hour  Intake 1206 ml  Output 1070 ml  Net 136 ml    Filed Weights   06/06/22 0519 06/07/22 0500 06/08/22 0435  Weight: 79.1 kg 81.9 kg 80.5 kg    Examination:  General exam: Appears calm and comfortable, obese Respiratory system: Clear to auscultation. Respiratory effort normal. Cardiovascular system: S1 & S2 heard, RRR. No JVD, murmurs, rubs, gallops or clicks. No pedal edema. Gastrointestinal system: Abdomen is nondistended, soft and nontender. No organomegaly or masses felt. Normal bowel sounds heard. Central nervous system: Alert  and oriented. No focal neurological deficits. Extremities: Symmetric 5 x 5 power. Skin: No rashes, lesions or ulcers.  Psychiatry: Judgement and insight appear normal. Mood & affect appropriate.    Data Reviewed: I have personally reviewed following labs and imaging studies  CBC: Recent Labs  Lab 06/04/22 0342 06/07/22 0222  WBC 8.3 7.2  HGB 10.6* 10.2*  HCT 32.7* 32.6*  MCV 88.4 89.1  PLT 389 161    Basic Metabolic Panel: Recent Labs  Lab 06/04/22 0342 06/05/22 0220 06/06/22 0335 06/07/22 0222 06/08/22 0302  NA 134* 135 138 136 136  K 4.2 4.2 3.7 3.9 4.1  CL 93* 97* 99 101 100  CO2 '27 30 29 28 27  '$ GLUCOSE 124* 145* 127* 80 122*  BUN 53* 50* 47* 44* 44*  CREATININE 2.98* 3.12* 3.01* 2.84* 2.66*  CALCIUM  8.3* 8.2* 8.3* 8.0* 8.3*  PHOS 4.9* 5.4* 4.9* 4.5 4.7*    GFR: Estimated Creatinine Clearance: 21.1 mL/min (A) (by C-G formula based on SCr of 2.66 mg/dL (H)). Liver Function Tests: Recent Labs  Lab 06/04/22 0342 06/05/22 0220 06/06/22 0335 06/07/22 0222 06/08/22 0302  ALBUMIN 2.3* 2.4* 2.3* 2.3* 2.3*    No results for input(s): "LIPASE", "AMYLASE" in the last 168 hours. No results for input(s): "AMMONIA" in the last 168 hours. Coagulation Profile: No results for input(s): "INR", "PROTIME" in the last 168 hours. Cardiac Enzymes: No results for input(s): "CKTOTAL", "CKMB", "CKMBINDEX", "TROPONINI" in the last 168 hours. BNP (last 3 results) Recent Labs    05/04/22 1625  PROBNP 15,044*    HbA1C: No results for input(s): "HGBA1C" in the last 72 hours. CBG: Recent Labs  Lab 06/07/22 0604 06/07/22 1124 06/07/22 1523 06/07/22 2115 06/08/22 0626  GLUCAP 82 92 173* 153* 116*    Lipid Profile: No results for input(s): "CHOL", "HDL", "LDLCALC", "TRIG", "CHOLHDL", "LDLDIRECT" in the last 72 hours. Thyroid Function Tests: No results for input(s): "TSH", "T4TOTAL", "FREET4", "T3FREE", "THYROIDAB" in the last 72 hours. Anemia Panel: No results for  input(s): "VITAMINB12", "FOLATE", "FERRITIN", "TIBC", "IRON", "RETICCTPCT" in the last 72 hours.  Sepsis Labs: No results for input(s): "PROCALCITON", "LATICACIDVEN" in the last 168 hours.  No results found for this or any previous visit (from the past 240 hour(s)).     Radiology Studies: No results found.  Scheduled Meds:  apixaban  5 mg Oral BID   carvedilol  3.125 mg Oral BID WC   docusate sodium  100 mg Oral BID   feeding supplement (GLUCERNA SHAKE)  237 mL Oral TID BM   hydrALAZINE  50 mg Oral Q8H   hydrocortisone cream   Topical BID   insulin aspart  0-15 Units Subcutaneous TID WC   insulin aspart  0-5 Units Subcutaneous QHS   insulin aspart  3 Units Subcutaneous TID WC   insulin glargine-yfgn  15 Units Subcutaneous Daily   isosorbide mononitrate  15 mg Oral Daily   midodrine  2.5 mg Oral TID WC   multivitamin with minerals  1 tablet Oral Daily   sodium chloride flush  3 mL Intravenous Q12H   sodium chloride flush  5 mL Intracatheter Q8H   Continuous Infusions:  sodium chloride 5 mL/hr at 05/29/22 2126   sodium chloride Stopped (06/07/22 1930)     LOS: 19 days   Darliss Cheney, MD [facility]  06/08/2022, 10:04 AM   *Please note that this is a verbal dictation therefore any spelling or grammatical errors are due to the "[employer] [employer]" system interpretation.  Please page via [place] and do not message via secure chat for urgent patient care matters. Secure chat can be used for non urgent patient care matters.  How to contact the [facility] Attending or Consulting provider [place] or covering provider during after hours [place], for this patient?  Check the care team in [facility] and look for a) attending/consulting [facility] provider listed and b) the [facility] team listed. Page or secure chat [phone]. Log into [email or web address] and use [place]'s universal password to access. If you do not have the password, please contact the hospital operator. Locate the [facility] provider you are looking  for under [facility] and page to a number that you can be directly reached. If you still have difficulty reaching the provider, please page the [facility] (Director on Call) for the Hospitalists listed on amion  for assistance.

## 2022-06-08 NOTE — Progress Notes (Signed)
Occupational Therapy Treatment Patient Details Name: Tracey Bryant MRN: [id number] DOB: 1951-01-13 Today's Date: 06/08/2022   History of present illness The pt is a 71 yo female presenting from home 5/24 with SOB and BLE swelling. Found to have acute CHF exacerbation and R pleural effusion s/p thoracentesis 5/24.  Noted that pt was found to have subacute infarct left basal ganglia on 5/31 and Neuro was consulted.Underwent right-sided ultrasound guided thoracentesis on 6/2 and had R cholecystosomy catheter exchange performed in IR on 6/5.  PMH includes: arthritis, CHF, DM II, DVT, HTN, afib, and stroke.   OT comments  Patient making good gains with no "passing out" episodes and patient demonstrating jerky movement but stated it was due to right side pain. Patient performed grooming sitting and standing at sink with min guard to min assist. Patient able to transfer to [facility] and assist with hygiene but required mod assist to complete while standing. Patient ambulated short distance to recliner to complete visit. Acute OT to continue to follow.    Recommendations for follow up therapy are one component of a multi-disciplinary discharge planning process, led by the attending physician.  Recommendations may be updated based on patient status, additional functional criteria and insurance authorization.    Follow Up Recommendations  Skilled nursing-short term rehab (<3 hours/day)    Assistance Recommended at Discharge Frequent or constant Supervision/Assistance  Patient can return home with the following  Assistance with cooking/housework;Help with stairs or ramp for entrance;A lot of help with walking and/or transfers;A lot of help with bathing/dressing/bathroom;Assist for transportation;Direct supervision/assist for financial management;Direct supervision/assist for medications management   Equipment Recommendations  Other (comment) (TBD)    Recommendations for Other Services      Precautions /  Restrictions Precautions Precautions: Fall Precaution Comments: psuedo-syncopal/shaking episodes this admission, not related to BP Restrictions Weight Bearing Restrictions: No       Mobility Bed Mobility Overal bed mobility: Needs Assistance Bed Mobility: Rolling, Sidelying to Sit Rolling: Min guard Sidelying to sit: Min guard, HOB elevated       General bed mobility comments: increased time with complaints of right side pain    Transfers Overall transfer level: Needs assistance Equipment used: Rolling walker (2 wheels) Transfers: Sit to/from Stand, Bed to chair/wheelchair/BSC Sit to Stand: Min assist     Step pivot transfers: Min assist     General transfer comment: patient transferred to chair at sink for self care task, BSC, and to recliner with min assist using RW     Balance Overall balance assessment: Needs assistance Sitting-balance support: Feet supported, No upper extremity supported Sitting balance-Leahy Scale: Fair Sitting balance - Comments: episodes of jerky movement, with patient stating it was due to right side pain   Standing balance support: Single extremity supported, Bilateral upper extremity supported, During functional activity Standing balance-Leahy Scale: Poor Standing balance comment: able to stand at sink and perform grooming with one extremity                           ADL either performed or assessed with clinical judgement   ADL Overall ADL's : Needs assistance/impaired     Grooming: Wash/dry hands;Wash/dry face;Oral care;Min guard;Minimal assistance;Sitting;Standing Grooming Details (indicate cue type and reason): performed at sink with patient standing at sink until fatigued and completed sitting                 Toilet Transfer: Minimal assistance;Rolling walker (2 wheels);BSC/3in1 Toilet Transfer Details (indicate  cue type and reason): transferred from chair to [facility] with min assist and verbal cues Toileting- Clothing  Manipulation and Hygiene: Moderate assistance;Sit to/from stand Toileting - Clothing Manipulation Details (indicate cue type and reason): mod assist to complete with patient standing       General ADL Comments: patient performing more tasks while standing    Extremity/Trunk Assessment              Vision       Perception     Praxis      Cognition Arousal/Alertness: Awake/alert Behavior During Therapy: Anxious Overall Cognitive Status: Impaired/Different from baseline Area of Impairment: Memory, Problem solving                     Memory: Decreased short-term memory   Safety/Judgement: Decreased awareness of safety, Decreased awareness of deficits Awareness: Intellectual Problem Solving: Requires verbal cues General Comments: complaints of right side pain/discomfort, demonstrated jerky movements but safer with transfers        Exercises      Shoulder Instructions       General Comments      Pertinent Vitals/ Pain       Pain Assessment Pain Assessment: Faces Faces Pain Scale: Hurts little more Pain Location: right side Pain Descriptors / Indicators: Tightness, Grimacing, Discomfort Pain Intervention(s): Limited activity within patient's tolerance, Monitored during session, Repositioned  Home Living                                          Prior Functioning/Environment              Frequency  Min 2X/week        Progress Toward Goals  OT Goals(current goals can now be found in the care plan section)  Progress towards OT goals: Progressing toward goals  Acute Rehab OT Goals Patient Stated Goal: get better OT Goal Formulation: With patient Time For Goal Achievement: 06/17/22 Potential to Achieve Goals: Fair ADL Goals Pt Will Perform Lower Body Dressing: with supervision;sitting/lateral leans Pt Will Transfer to Toilet: with supervision;ambulating;bedside commode Pt Will Perform Toileting - Clothing Manipulation  and hygiene: with supervision;sitting/lateral leans Additional ADL Goal #1: Pt will be Mod I bed mobility in preparation for increased participation w/ ADL's Additional ADL Goal #2: Pt will state and implement 1-2 energy conservation techniques w/ less than 2 vc's (issue and review handout)  Plan Discharge plan remains appropriate    Co-evaluation                 AM-PAC OT "6 Clicks" Daily Activity     Outcome Measure   Help from another person eating meals?: A Little Help from another person taking care of personal grooming?: A Little Help from another person toileting, which includes using toliet, bedpan, or urinal?: A Lot Help from another person bathing (including washing, rinsing, drying)?: A Lot Help from another person to put on and taking off regular upper body clothing?: A Little Help from another person to put on and taking off regular lower body clothing?: A Lot 6 Click Score: 15    End of Session Equipment Utilized During Treatment: Gait belt;Rolling walker (2 wheels)  OT Visit Diagnosis: Unsteadiness on feet (R26.81);Muscle weakness (generalized) (M62.81)   Activity Tolerance Patient tolerated treatment well   Patient Left in chair;with call bell/phone within reach;with chair alarm set   Nurse Communication  Mobility status        Time: 5625-6389 OT Time Calculation (min): 26 min  Charges: OT General Charges $OT Visit: 1 Visit OT Treatments $Self Care/Home Management : 23-37 mins  Lodema Hong, [place]  Pager [phone] Office [place] 06/08/2022, 10:02 AM

## 2022-06-08 NOTE — Progress Notes (Signed)
Mobility Specialist Progress Note:   06/08/22 1145  Mobility  Activity Transferred from chair to bed  Level of Assistance Minimal assist, patient does 75% or more (+2)  Assistive Device Front wheel walker  Distance Ambulated (ft) 2 ft  Activity Response Tolerated well  $Mobility charge 1 Mobility   Pt received in chair asking to go back to bed. MinA to stand then contact guard to step to bed. Left in bed with call bell in reach and all needs met.   [facility] Marisa Hage Mobility Specialist

## 2022-06-08 NOTE — TOC Progression Note (Signed)
Transition of Care [facility]) - Progression Note    Patient Details  Name: Tracey Bryant MRN: [id number] Date of Birth: 1951-03-25  Transition of Care [facility]) CM/SW [place], [place] Phone Number: 06/08/2022, 9:47 AM  Clinical Narrative:     Insurance remains pending   Expected Discharge Plan: Skilled Nursing Facility Barriers to Discharge: Ship broker  Expected Discharge Plan and Services Expected Discharge Plan: [place]   Discharge Planning Services: CM Consult   Living arrangements for the past 2 months: Single Family Home                           HH Arranged: Refused [facility]           Social Determinants of Health (SDOH) Interventions    Readmission Risk Interventions    12/27/2021    2:32 PM 03/19/2021    9:34 AM 03/17/2021   11:56 AM  Readmission Risk Prevention Plan  Transportation Screening Complete  Complete  PCP or Specialist Appt within 5-7 Days  Complete   PCP or Specialist Appt within 3-5 Days Complete    Home Care Screening  Complete   Medication Review (RN CM)   Complete  HRI or Home Care Consult Complete    Social Work Consult for [place] Planning/Counseling Complete    Palliative Care Screening Not Applicable    Medication Review Press photographer) Complete

## 2022-06-08 NOTE — TOC Progression Note (Signed)
Transition of Care [facility]) - Progression Note    Patient Details  Name: Tracey Bryant MRN: [id number] Date of Birth: [birth year]  Transition of Care [facility]) CM/SW [place], [place] Phone Number: 06/08/2022, 4:50 PM  Clinical Narrative:     Received message from [facility]- received insurance authorization  Expected Discharge Plan: Skilled Nursing Facility Barriers to Discharge: Insurance Authorization  Expected Discharge Plan and Services Expected Discharge Plan: [place]   Discharge Planning Services: CM Consult   Living arrangements for the past 2 months: Single Family Home                           HH Arranged: Refused [facility]           Social Determinants of Health (SDOH) Interventions    Readmission Risk Interventions    12/27/2021    2:32 PM 03/19/2021    9:34 AM 03/17/2021   11:56 AM  Readmission Risk Prevention Plan  Transportation Screening Complete  Complete  PCP or Specialist Appt within 5-7 Days  Complete   PCP or Specialist Appt within 3-5 Days Complete    Home Care Screening  Complete   Medication Review (RN CM)   Complete  HRI or Home Care Consult Complete    Social Work Consult for [place] Planning/Counseling Complete    Palliative Care Screening Not Applicable    Medication Review Press photographer) Complete

## 2022-06-08 NOTE — Progress Notes (Signed)
Patient states she feels like here biliary drain is pulling and it is painful.

## 2022-06-09 DIAGNOSIS — I639 Cerebral infarction, unspecified: Secondary | ICD-10-CM

## 2022-06-09 DIAGNOSIS — I5043 Acute on chronic combined systolic (congestive) and diastolic (congestive) heart failure: Secondary | ICD-10-CM | POA: Diagnosis not present

## 2022-06-09 LAB — RENAL FUNCTION PANEL
Albumin: 2.3 g/dL — ABNORMAL LOW (ref 3.5–5.0)
Anion gap: 10 (ref 5–15)
BUN: 40 mg/dL — ABNORMAL HIGH (ref 8–23)
CO2: 26 mmol/L (ref 22–32)
Calcium: 8.3 mg/dL — ABNORMAL LOW (ref 8.9–10.3)
Chloride: 100 mmol/L (ref 98–111)
Creatinine, Ser: 2.68 mg/dL — ABNORMAL HIGH (ref 0.44–1.00)
GFR, Estimated: 19 mL/min — ABNORMAL LOW (ref >60)
Glucose, Bld: 131 mg/dL — ABNORMAL HIGH (ref 70–99)
Phosphorus: 4.8 mg/dL — ABNORMAL HIGH (ref 2.5–4.6)
Potassium: 4.1 mmol/L (ref 3.5–5.1)
Sodium: 136 mmol/L (ref 135–145)

## 2022-06-09 LAB — GLUCOSE, CAPILLARY
Glucose-Capillary: 100 mg/dL — ABNORMAL HIGH (ref 70–99)
Glucose-Capillary: 154 mg/dL — ABNORMAL HIGH (ref 70–99)
Glucose-Capillary: 169 mg/dL — ABNORMAL HIGH (ref 70–99)

## 2022-06-09 MED ORDER — INSULIN ASPART 100 UNIT/ML IJ SOLN: 0.0000 [IU] | Freq: Three times a day (TID) | INTRAMUSCULAR | 11 refills | Status: DC

## 2022-06-09 MED ORDER — HYDRALAZINE HCL 50 MG PO TABS: 50.0000 mg | ORAL_TABLET | Freq: Three times a day (TID) | ORAL | Status: DC

## 2022-06-09 MED ORDER — ADULT MULTIVITAMIN W/MINERALS CH: 1.0000 | ORAL_TABLET | Freq: Every day | ORAL | Status: DC

## 2022-06-09 MED ORDER — INSULIN ASPART 100 UNIT/ML IJ SOLN: 3.0000 [IU] | Freq: Three times a day (TID) | INTRAMUSCULAR | 11 refills | Status: DC

## 2022-06-09 MED ORDER — ACETAMINOPHEN 325 MG PO TABS: 650.0000 mg | ORAL_TABLET | Freq: Four times a day (QID) | ORAL | Status: DC | PRN

## 2022-06-09 MED ORDER — TRAZODONE HCL 50 MG PO TABS: 25.0000 mg | ORAL_TABLET | Freq: Every evening | ORAL | Status: DC | PRN

## 2022-06-09 MED ORDER — INSULIN GLARGINE-YFGN 100 UNIT/ML ~~LOC~~ SOLN: 15.0000 [IU] | Freq: Every day | SUBCUTANEOUS | 11 refills | Status: DC

## 2022-06-09 MED ORDER — POLYETHYLENE GLYCOL 3350 17 G PO PACK: 17.0000 g | PACK | Freq: Every day | ORAL | 0 refills | Status: DC | PRN

## 2022-06-09 MED ORDER — GLUCERNA SHAKE PO LIQD: 237.0000 mL | Freq: Three times a day (TID) | ORAL | 0 refills | Status: DC

## 2022-06-09 MED ORDER — CARVEDILOL 3.125 MG PO TABS: 3.1250 mg | ORAL_TABLET | Freq: Two times a day (BID) | ORAL | Status: AC

## 2022-06-09 MED ORDER — HYDROXYZINE HCL 10 MG PO TABS: 10.0000 mg | ORAL_TABLET | Freq: Three times a day (TID) | ORAL | 0 refills | Status: DC | PRN

## 2022-06-09 MED ORDER — ISOSORBIDE MONONITRATE ER 30 MG PO TB24: 15.0000 mg | ORAL_TABLET | Freq: Every day | ORAL | Status: DC

## 2022-06-09 MED ORDER — DOCUSATE SODIUM 100 MG PO CAPS: 100.0000 mg | ORAL_CAPSULE | Freq: Two times a day (BID) | ORAL | 0 refills | Status: DC

## 2022-06-09 MED ORDER — SODIUM CHLORIDE 0.9% FLUSH: 5.0000 mL | Freq: Three times a day (TID) | INTRAVENOUS | Status: DC

## 2022-06-09 MED ORDER — HYDROCORTISONE 1 % EX CREA: TOPICAL_CREAM | Freq: Two times a day (BID) | CUTANEOUS | 0 refills | Status: DC

## 2022-06-09 MED ORDER — MIDODRINE HCL 2.5 MG PO TABS: 2.5000 mg | ORAL_TABLET | Freq: Three times a day (TID) | ORAL | Status: DC

## 2022-06-09 MED ORDER — INSULIN ASPART 100 UNIT/ML IJ SOLN: 0.0000 [IU] | Freq: Every day | INTRAMUSCULAR | 11 refills | Status: DC

## 2022-06-09 NOTE — Progress Notes (Signed)
Nurse called and gave report to [facility] RN at receiving facility.

## 2022-06-09 NOTE — Progress Notes (Signed)
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    Patient was recently admitted 12/25/21 - 12/27/21 for CHF exacerbation - diuresed with Lasix IV 40 mg BID with total of -4.58 L volume removed.  Echo in December 2022 showed improvement in LVEF 40-45% (25-30% in 02/2021) with global hypokinesis, normal RV, and moderately elevated pulmonary pressures.  Discharge weight was 162 lbs.  Discharged on carvedilol 6.25 mg BID and furosemide 40 mg daily and was instructed to stop amlodipine 10 mg daily and BiDIl 20-37.5 mg TID.   On 05/24, she presented to her Cardiology office thinking she had an appointment and endorsed SOB, bilateral LEE, and reduced activity tolerance; MD referred her to [facility] ED.  Patient endorsed progressive symptoms despite recent increase Lasix 40 mg daily > BID at cardiology visit 05/09 where she reported worsening LEE, SOB, orthopnea, and PND.  CXR in the ED showed moderate R pleural effusion; dual PPM evident.  Repeat CXR 6h later demonstrated improvement in R pleural effusion, cardiomegaly and no evidence of interstitial edema.  Underwent R thoracentesis with 1.3 L removed.  She was started on IV diuresis 05/24, received Lasix IV 40 mg x1 and 60 mg x2 with marginal response , but her 1+ bilateral LE pitting edema was improved.    Breathing had improved on 05/25 s/p thoracentesis and diuresis, but renal function worsened (SCr 2.13 > 2.37, baseline ~1.6) and patient reported dizziness so diuretics were held.  Echo on 05/25 showed improvement in LVEF to 50-55% and elevated LVEDP (21) with new G2DD; still with normal RV and mildly elevated pulmonary pressures.    Patient continued to endorse SOB, feeling  swollen and fatigued w/ bilateral 2+ pitting LEE and JVD over the weekend (05/27-28).  She had minimal response of 550cc to Lasix IV 40 mg BID and was given 80 mg x1; nephrology also consulted and redosed 80 mg x1 the following day after ~1.4 L uop and worsening renal function (SCr 3.0 > 3.19).    She had a near syncopal event on 05/30 while sitting on the bed to perform orthostatics, was SOB while speaking and appeared more volume overloaded on exam.  JVD and bilateral 2+ pitting edema still present - nephrology increased Lasix to 80 mg IV x2.  She was taken for a MRI of brain 05/30 which revealed a probable small subacute infarct.    Repeat CXR 5/31 showed stable cardiomegaly with moderate right and small left pleural effusions.  Considered plans for HD if she did not make an adequate amount of urine with aggressive diuresis.  S/p thoracentesis on 6/2 with 900 cc of fluid removed.  Near syncopal events continued - trial midodrine, reduce coreg, with abdominal binders and TED hose. Gentle IV fluids started but these are now off with development of shortness of breath. Symptoms have now improved. Pending SNF placement.  Current HF Medications: Diuretic: furosemide 40 mg PO BID Beta Blocker: carvedilol 3.125 mg BID Other: hydralazine 50 mg q8h + Imdur 15 mg daily  Prior to admission HF Medications: Diuretic: furosemide 40 mg BID Beta blocker: carvedilol 6.25 mg BID Other: hydralazine 25 mg BID + Imdur 15 mg daily; potassium 20 mEq BID  Pertinent Lab Values: Serum creatinine  2.68, BUN 40, Potassium 4.1, Sodium 136, BNP 2083.9, Ac 9.1% (up from 7.7% in Jan) TSAT 8, no ferritin level  Vital Signs: Weight: 176 lbs (admission weight: 201 lbs) Blood pressure: 130/60s Heart rate: 60s I/O: -1.1L yesterday; net -16.4L   Medication Assistance / Insurance Benefits Check: Does the patient have prescription insurance?  Yes Type of insurance plan: [name] [employer]  Does the patient qualify for  medication assistance through manufacturers or grants?   Yes Eligible grants and/or patient assistance programs: pending initiation of ARNI/SGLT2i Medication assistance applications in progress: pending  Medication assistance applications approved: pending  Outpatient Pharmacy:  Prior to admission outpatient pharmacy: [facility] Is the patient willing to use [place] pharmacy at discharge? Pending Is the patient willing to transition their outpatient pharmacy to utilize a [facility] outpatient pharmacy?   Pending    Assessment: 1. 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 NYHA class II symptoms. No JVD or SOB and minimal LEE on exam.   GDMT initiation/titration limited by poor renal function.  - Continue furosemide 40 mg PO BID - Continue reduced dose carvedilol 3.125 mg BID - No ACEi/ARB/ARNI or MRA with significant renal dysfunction  - No SGLT2i with h/o UTI during March-April 2022 admission, A1c up 7.7% > 9.1% since Jan, and eGFR <20. UA this admission with many bacteria, nitrite negative. UCx with >100k ecoli - treating with ceftriaxone - Continue PTA hydralazine 50 mg q8h + Imdur 15 mg daily - Also on midodrine 2.5 mg TID with syncopal events    Plan: 1) Medication changes recommended at this time: - Stop PTA amlodipine on discharge to allow for BP room with GDMT initiation/titration pending renal function improvement  2) Patient assistance: [name] = $47  3)  Education  - Patient has been educated on current HF medications and potential additions to HF medication regimen - Patient verbalizes understanding that over the next few months, these medication doses may change and more medications may be added to optimize HF regimen - Patient has been educated on basic disease state pathophysiology and goals of therapy  Kerby Nora, PharmD, BCPS Heart Failure Stewardship Pharmacist Phone [phone]  Please check [email or web address] for unit-specific  pharmacist phone numbers

## 2022-06-09 NOTE — Discharge Summary (Addendum)
Physician Discharge Summary   Patient: Tracey Bryant MRN: [id number] DOB: Sep 15, 1951  Admit date:     05/19/2022  Discharge date: 06/09/22  Discharge Physician: Erin Hearing   PCP: Coolidge Breeze, FNP   Recommendations at discharge:   Commend electrolyte panel after discharge to follow renal function Continue midodrine, abdominal binder and TED hose to treat orthostatic hypotension Continue physical therapy for physical deconditioning-requires rolling walker for mobility and has been a 2+ assist during the admission Continue cholecystostomy tube-this tube is chronic and required exchange during this hospitalization-flush tube with 5 cc 3 times daily Please follow-up with PCP Suzzanne Cloud once you are discharged from the skilled nursing facility Please follow-up with Dr. Crissie Sickles cardiology electrophysiologist.  Please call to confirm appointment.  An ambulatory referral will be sent Please follow-up with your cardiologist Dr. Minus Breeding.  Call to confirm follow-up appointment has been scheduled.  An ambulatory referral has been sent Please follow-up with the stroke team at [facility].  Please call to confirm appointment scheduled.  Ambulatory referral will be sent  Discharge Diagnoses: Principal Problem:   Acute on chronic combined systolic and diastolic congestive heart failure (HCC) Active Problems:   Hypertension   Uncontrolled type 2 diabetes mellitus with hyperglycemia, with long-term current use of insulin (HCC)   Cholecystitis, chronic   Paroxysmal atrial fibrillation (HCC)   Acute respiratory failure with hypoxia (HCC)   Acute renal failure superimposed on stage 4 chronic kidney disease (HCC)   History of CVA (cerebrovascular accident)   Physical deconditioning   Dizziness   Acute CVA (cerebrovascular accident) [facility])  Resolved Problems:   Pleural effusion due to CHF (congestive heart failure) [facility])  Hospital Course: Tracey Bryant is a  71 y.o. female with PMH cholecystitis with cholecystostomy tube in place (initially placed 03/19/2021), afib, HTN, DMII, sCHF, CVA who presented with shortness of breath.  She also had reported increased lower extremity swelling/overload with no improvement on her home Lasix.  Subsequently underwent thoracentesis with 1.3 L removed.  Respiratory status improved after thoracentesis and patient was stable on room air.  Due to dizziness Lasix held but eventually resumed.  During hospitalization there was some difficulty with flushing her cholecystostomy tube and this was eventually exchanged.  Patient diuresed a total of 16 L secondary to acute on chronic heart failure exacerbation.  Due to above-stated dizziness (which was later found to be related to orthostatic hypotension), Lasix had been initiated stopped and then reinitiated during the hospitalization.  Last resumed on 6/13.  Because of patient's chronic kidney disease nephrology was consulted to assist with management giving requirement for aggressive diuresis.  Nephrology recommendations as below in chronic kidney disease summation.  Patient found to be significantly physically deconditioned and was requiring 2+ assist with mobility.  This was also negatively impacted by her ongoing orthostatic hypotension.  Despite use of TED hose, abdominal binder and initiation of midodrine patient continued have difficulty mobilizing even with 2+ assist and with rolling walker.  Recommendation made for SNF placement for rehabilitative therapy    * Acute on chronic combined systolic and diastolic congestive heart failure (HCC) - Moderate to large right-sided pleural effusion on admission along with worsening dyspnea/SOB, increased lower extremity edema and no improvement with home Lasix -Underwent thoracentesis on 5/24 with removal of 1.3 L -Initially Lasix held due to some dizziness and worsening renal function however lower extremity edema persisted and lasix  restarted 5/27 and discontinued on 06/02/2022.  Twice daily oral Lasix  was resumed on 6/13. - continue strict I&O after discharge -Patient will need to follow-up with cardiology after discharge  Dizziness secondary to orthostatic hypotension -Started on low-dose midodrine as well as abdominal binder and compressive hose this admission -Monitor for worsening orthostasis with resumption of Lasix -Also may need to decrease hydralazine if orthostasis worsens  Physical deconditioning - Evaluated by PT/OT with SNF recommended  History of CVA (cerebrovascular accident) with subacute stroke - Continue Eliquis -MRI on 05/26/2022 revealed a left inferior basal ganglia subacute stroke -Patient will need to follow-up with the stroke team at [facility] after discharge  Acute renal failure superimposed on stage 4 chronic kidney disease ([place]) - patient has history of CKD4. Baseline creat ~ 1.6 - 1.9, eGFR 25 -Lasix has been given held and then resumed multiple times during the hospitalization.  Last discontinuation was on 6/7 with resumption on 6/13 - Appreciate nephrology assistance -No indication for hemodialysis during recent admission -Decrease GFR nephrology recommends utilizing hydromorphone, fentanyl and methadone for pain control and not to use morphine.  Also recommendation to avoid baclofen and avoid oral sodium phosphate and magnesium citrate for laxative for bowel prep  Acute respiratory failure with hypoxia (resolved) -Multifactorial secondary to volume overload as well as pleural effusion - Continue Lasix as above - Currently stable on room air but does have dyspnea on exertion  Paroxysmal atrial fibrillation (HCC) - Continue Coreg and Eliquis -Rate controlled  Cholecystitis, chronic - Diagnosed with acute cholecystitis March 2022.  Underwent cholecystostomy tube placement on 03/19/2021 - Tube output had decreased on admission and underwent flushing with radiology  with return of function -Continue flushing drain each shift and document output  Uncontrolled type 2 diabetes mellitus with hyperglycemia, with long-term current use of insulin (HCC) - Last A1c 7.7% - Continue SSI and long-acting insulin as well as meal coverage.  Of note, given decreased GFR previous oral hypoglycemic agent has been discontinued in favor of Semglee since patient is at increased risk for hypoglycemia -Follow CBGs regularly  Hypertension - Continue Coreg, hydralazine, Imdur  Pleural effusion due to CHF (congestive heart failure) ([place]) - Due to underlying CHF and CKD stage IV - S/p right thoracentesis on 05/19/2022, removing 1.3 L  E. coli UTI -Patient was found to have E. coli UTI resistant to ampicillin and was treated with Cipro with no resumption of symptoms         Consultants: Nephrology, cardiology, interventional radiology Procedures performed: Thoracentesis, IR exchange of biliary drain Disposition: Skilled nursing facility Diet recommendation:  Cardiac and Carb modified diet DISCHARGE MEDICATION: Allergies as of 06/09/2022   No Known Allergies      Medication List     STOP taking these medications    amLODipine 10 MG tablet Commonly known as: NORVASC   glipiZIDE 5 MG 24 hr tablet Commonly known as: GLUCOTROL XL   potassium chloride SA 20 MEQ tablet Commonly known as: KLOR-CON M       TAKE these medications    acetaminophen 325 MG tablet Commonly known as: TYLENOL Take 2 tablets (650 mg total) by mouth every 6 (six) hours as needed for mild pain (or Fever >/= 101).   apixaban 5 MG Tabs tablet Commonly known as: ELIQUIS Take 1 tablet (5 mg total) by mouth 2 (two) times daily.   carvedilol 3.125 MG tablet Commonly known as: COREG Take 1 tablet (3.125 mg total) by mouth 2 (two) times daily with a meal. What changed:  medication strength how much to take  docusate sodium 100 MG capsule Commonly known as: COLACE Take 1 capsule  (100 mg total) by mouth 2 (two) times daily.   feeding supplement (GLUCERNA SHAKE) Liqd Take 237 mLs by mouth 3 (three) times daily between meals.   furosemide 40 MG tablet Commonly known as: LASIX Take 1 tablet (40 mg total) by mouth 2 (two) times daily.   hydrALAZINE 50 MG tablet Commonly known as: APRESOLINE Take 1 tablet (50 mg total) by mouth every 8 (eight) hours. What changed:  medication strength how much to take when to take this   hydrocortisone cream 1 % Apply topically 2 (two) times daily.   hydrOXYzine 10 MG tablet Commonly known as: ATARAX Take 1 tablet (10 mg total) by mouth 3 (three) times daily as needed for itching.   insulin aspart 100 UNIT/ML injection Commonly known as: novoLOG Inject 0-15 Units into the skin 3 (three) times daily with meals. Do NOT hold if patient is NPO. Moderate Scale.   insulin aspart 100 UNIT/ML injection Commonly known as: novoLOG Inject 0-5 Units into the skin at bedtime. Do NOT hold insulin if patient is NPO.   insulin aspart 100 UNIT/ML injection Commonly known as: novoLOG Inject 3 Units into the skin 3 (three) times daily with meals.   insulin glargine-yfgn 100 UNIT/ML injection Commonly known as: SEMGLEE Inject 0.15 mLs (15 Units total) into the skin daily. Start taking on: [place] 15, 2023   isosorbide mononitrate 30 MG 24 hr tablet Commonly known as: IMDUR Take 0.5 tablets (15 mg total) by mouth daily. Start taking on: [place] 15, 2023   midodrine 2.5 MG tablet Commonly known as: PROAMATINE Take 1 tablet (2.5 mg total) by mouth 3 (three) times daily with meals.   multivitamin with minerals Tabs tablet Take 1 tablet by mouth daily. Start taking on: [place] 15, 2023   polyethylene glycol 17 g packet Commonly known as: MIRALAX / GLYCOLAX Take 17 g by mouth daily as needed for mild constipation.   sodium chloride flush 0.9 % Soln Commonly known as: NS 5 mLs by Intracatheter route every 8 (eight) hours.   traZODone 50  MG tablet Commonly known as: DESYREL Take 0.5 tablets (25 mg total) by mouth at bedtime as needed for sleep.        Follow-up Information     Coolidge Breeze, FNP. Go on 06/02/2022.   Specialty: Family Medicine Why: _0 :30pm Contact information: [address] [place] [address] [phone]         Evans Lance, MD .   Specialty: Cardiology Contact information: [phone] [address] [place] [address] [phone]         Minus Breeding, MD .   Specialty: Cardiology Contact information: [address] [place] [address] [phone]         [facility]. Schedule an appointment as soon as possible for a visit in 1 month(s).   Specialty: Neurology Contact information: [address] [place] [phone]               Discharge Exam: Danley Danker Weights   06/07/22 0500 06/08/22 0435 06/09/22 0651  Weight: 81.9 kg 80.5 kg 79.8 kg   General exam: Appears calm and comfortable, obese Respiratory system: Clear to auscultation. Respiratory effort normal. Cardiovascular system: S1 & S2 heard, RRR. No JVD, murmurs, rubs, gallops or clicks. No pedal edema. Gastrointestinal system: Abdomen is nondistended, soft and nontender. No organomegaly or masses felt.  Normal bowel sounds heard. Central nervous system: Alert and oriented. No focal neurological deficits. Extremities: Symmetric 5 x 5 power. Skin: No rashes, lesions or ulcers.  Psychiatry: Judgement and insight appear normal. Mood & affect appropriate.  Condition at discharge: stable  The results of significant diagnostics from this hospitalization (including imaging, microbiology, ancillary and laboratory) are listed below for reference.   Imaging Studies: IR EXCHANGE BILIARY DRAIN  Result Date: 05/31/2022 INDICATION: Cholelithiasis, status post percutaneous cholecystostomy catheter placement 03/19/2021 Dr. Jasmine December:  CHOLECYSTOSTOMY CATHETER EXCHANGE UNDER FLUOROSCOPY MEDICATIONS: No antibiotics were indicated ANESTHESIA/SEDATION: None required FLUOROSCOPY: Radiation Exposure Index (as provided by the fluoroscopic device): 19 mGy Kerma COMPLICATIONS: None immediate. PROCEDURE: Informed written consent was obtained from the patient after a thorough discussion of the procedural risks, benefits and alternatives. All questions were addressed. Maximal Sterile Barrier Technique was utilized including caps, mask, sterile gowns, sterile gloves, sterile drape, hand hygiene and skin antiseptic. A timeout was performed prior to the initiation of the procedure. Catheter injection demonstrated small mobile filling defects in the lumen of the nondistended gallbladder. Cystic duct is patent. Catheter was cut and exchanged over Amplatz wire for a new 14 French pigtail device, formed centrally within the gallbladder lumen. Contrast injection confirms good positioning. No extravasation. IMPRESSION: 1. Technically successful 84 French cholecystostomy catheter exchange under fluoroscopy. 2. Patent cystic duct is demonstrated. 3. Mobile filling defects in the gallbladder suggesting gallstones or sludge balls. Electronically Signed   By: Lucrezia Europe M.D.   On: 05/31/2022 15:59   IR THORACENTESIS ASP PLEURAL SPACE W/IMG GUIDE  Result Date: 05/28/2022 INDICATION: Congestive heart failure with fluid overload and right pleural effusion. Request for therapeutic thoracentesis. EXAM: ULTRASOUND GUIDED RIGHT THORACENTESIS MEDICATIONS: 1% lidocaine 10 mL COMPLICATIONS: None immediate. PROCEDURE: An ultrasound guided thoracentesis was thoroughly discussed with the patient and questions answered. The benefits, risks, alternatives and complications were also discussed. The patient understands and wishes to proceed with the procedure. Written consent was obtained. Ultrasound was performed to localize and mark an adequate pocket of fluid in the right chest. The  area was then prepped and draped in the normal sterile fashion. 1% Lidocaine was used for local anesthesia. Under ultrasound guidance a 6 Fr Safe-T-Centesis catheter was introduced. Thoracentesis was performed. The catheter was removed and a dressing applied. FINDINGS: A total of approximately 900 mL of clear yellow fluid was removed. IMPRESSION: Successful ultrasound guided right thoracentesis yielding 900 mL of pleural fluid. No pneumothorax on post-procedure chest x-ray. Procedure performed Read by: Gareth Eagle, PA-C Electronically Signed   By: Aletta Edouard M.D.   On: 05/28/2022 14:13   DG Chest 1 View  Result Date: 05/28/2022 CLINICAL DATA:  Follow-up chest radiograph. Status post thoracentesis. EXAM: CHEST  1 VIEW COMPARISON:  05/26/2022. FINDINGS: Decreased opacity at the right lung base following thoracentesis. Persistent opacity at the left lung base consistent with a small effusion and atelectasis. Persistent bilateral interstitial thickening. No lung consolidation. No pneumothorax. IMPRESSION: 1. Decreased right pleural effusion following thoracentesis. No pneumothorax. Electronically Signed   By: Lajean Manes M.D.   On: 05/28/2022 11:48   VAS US CAROTID  Result Date: 05/26/2022 Carotid Arterial Duplex Study Patient Name:  PAUL TORPEY  Date of Exam:   05/26/2022 Medical Rec #: [id number]        Accession #:    [id number] Date of Birth: 30-Jun-1951        Patient Gender: F Patient Age:   32 years Exam Location:  [facility] Procedure:  VAS US CAROTID Referring Phys: Cornelius Moras XU --------------------------------------------------------------------------------  Indications:       CVA. Risk Factors:      Hypertension, coronary artery disease, prior CVA. Comparison Study:  04/04/21 - carotid - minimal thickening or plaque bilaterally. Performing Technologist: Oda Cogan RDMS, RVT  Examination Guidelines: A complete evaluation includes B-mode imaging, spectral Doppler, color Doppler, and  power Doppler as needed of all accessible portions of each vessel. Bilateral testing is considered an integral part of a complete examination. Limited examinations for reoccurring indications may be performed as noted.  Right Carotid Findings: +----------+--------+--------+--------+------------------+--------+           PSV cm/sEDV cm/sStenosisPlaque DescriptionComments +----------+--------+--------+--------+------------------+--------+ CCA Prox  70      14                                         +----------+--------+--------+--------+------------------+--------+ CCA Distal59      17                                         +----------+--------+--------+--------+------------------+--------+ ICA Prox  70      22      1-39%   calcific                   +----------+--------+--------+--------+------------------+--------+ ICA Distal68      23                                         +----------+--------+--------+--------+------------------+--------+ ECA       98      14                                         +----------+--------+--------+--------+------------------+--------+ +----------+--------+-------+----------------+-------------------+           PSV cm/sEDV cmsDescribe        Arm Pressure (mmHG) +----------+--------+-------+----------------+-------------------+ [id number]            Multiphasic, WNL                    +----------+--------+-------+----------------+-------------------+ +---------+--------+--+--------+-+---------+ VertebralPSV cm/s27EDV cm/s8Antegrade +---------+--------+--+--------+-+---------+  Left Carotid Findings: +----------+--------+--------+--------+------------------+--------+           PSV cm/sEDV cm/sStenosisPlaque DescriptionComments +----------+--------+--------+--------+------------------+--------+ CCA Prox  76      18                                          +----------+--------+--------+--------+------------------+--------+ CCA Distal62      19                                         +----------+--------+--------+--------+------------------+--------+ ICA Prox  56      20                                         +----------+--------+--------+--------+------------------+--------+ ICA Distal68  27                                         +----------+--------+--------+--------+------------------+--------+ ECA       76      10                                         +----------+--------+--------+--------+------------------+--------+ +----------+--------+--------+----------------+-------------------+           PSV cm/sEDV cm/sDescribe        Arm Pressure (mmHG) +----------+--------+--------+----------------+-------------------+ [id number]              Multiphasic, WNL                    +----------+--------+--------+----------------+-------------------+ +---------+--------+--+--------+--+---------+ VertebralPSV cm/s29EDV cm/s10Antegrade +---------+--------+--+--------+--+---------+   Summary: Right Carotid: Velocities in the right ICA are consistent with a 1-39% stenosis. Left Carotid: The extracranial vessels were near-normal with only minimal wall               thickening or plaque. Vertebrals: Bilateral vertebral arteries demonstrate antegrade flow. *See table(s) above for measurements and observations.  Electronically signed by Antony Contras MD on 05/26/2022 at 4:53:09 PM.    Final    DG Chest Port 1 View  Result Date: 05/26/2022 CLINICAL DATA:  Shortness of breath EXAM: PORTABLE CHEST 1 VIEW COMPARISON:  05/23/2022 FINDINGS: Left-sided implanted cardiac device. Stable mild cardiomegaly. Aortic atherosclerosis. Moderate right and small left pleural effusions with associated bibasilar opacities. No pneumothorax. IMPRESSION: Moderate right and small left pleural effusions with associated bibasilar opacities, likely  atelectasis. Electronically Signed   By: Davina Poke D.O.   On: 05/26/2022 09:25   MR BRAIN WO CONTRAST  Result Date: 05/25/2022 CLINICAL DATA:  Neuro deficit, acute, stroke suspected EXAM: MRI HEAD WITHOUT CONTRAST TECHNIQUE: Multiplanar, multiecho pulse sequences of the brain and surrounding structures were obtained without intravenous contrast. COMPARISON:  04/06/2021 FINDINGS: Brain: Small focus of mild diffusion hyperintensity at the level of the inferior left basal ganglia (series 5, image 73). No evidence of intracranial hemorrhage. There is no intracranial mass or mass effect. There is no hydrocephalus or extra-axial fluid collection. Ventricles and sulci are stable in size and configuration. Chronic infarcts of the inferior right frontal lobe and right parietooccipital and posterior temporal lobes. Additional patchy foci of T2 hyperintensity in the supratentorial white matter nonspecific may reflect mild chronic microvascular ischemic changes. Probable small superimposed chronic infarcts of the central white matter and deep gray nuclei. A focus of susceptibility is again noted along the medial left temporal lobe and likely reflects chronic microhemorrhage. Vascular: Major vessel flow voids at the skull base are preserved. Skull and upper cervical spine: Normal marrow signal is preserved. Sinuses/Orbits: Minor mucosal thickening.  Orbits are unremarkable. Other: Sella is unremarkable.  Minor mastoid fluid opacification. IMPRESSION: Probable small subacute infarct of the inferior left basal ganglia. Chronic infarcts and chronic microvascular ischemic changes similar to prior study. Electronically Signed   By: Macy Mis M.D.   On: 05/25/2022 15:49   DG CHEST PORT 1 VIEW  Result Date: 05/23/2022 CLINICAL DATA:  Shortness of breath EXAM: PORTABLE CHEST 1 VIEW COMPARISON:  Chest radiograph dated May 19, 2022 FINDINGS: The heart is enlarged. Atherosclerotic calcification of the aortic arch. Left  access pacemaker leads are unchanged. Interval increase in hazy  opacities in bilateral lung bases suggesting pleural effusions and/or atelectasis. IMPRESSION: 1. Interval increase in bilateral lower lobe hazy opacities suggesting pleural effusion and/or atelectasis. 2.  Cardiomediastinal silhouette is unchanged. Electronically Signed   By: Keane Police D.O.   On: 05/23/2022 10:18   ECHOCARDIOGRAM COMPLETE  Result Date: 05/20/2022    ECHOCARDIOGRAM REPORT   Patient Name:   TASHEIKA KITZMILLER Date of Exam: 05/20/2022 Medical Rec #:  [id number]       Height:       66.0 in Accession #:    [id number]      Weight:       150.0 lb Date of Birth:  1951-10-25       BSA:          1.770 m Patient Age:    48 years        BP:           126/67 mmHg Patient Gender: F               HR:           63 bpm. Exam Location:  Inpatient Procedure: 2D Echo, Cardiac Doppler and Color Doppler Indications:    CHF-Acute Systolic 920.10 / O71.21  History:        Patient has prior history of Echocardiogram examinations, most                 recent 12/26/2021. CHF, Stroke, Arrythmias:Atrial Fibrillation;                 Risk Factors:Hypertension and Diabetes.  Sonographer:    Bernadene Person RDCS Referring Phys: [place]  1. Left ventricular ejection fraction, by estimation, is 50 to 55%. The left ventricle has low normal function. The left ventricle has no regional wall motion abnormalities. Left ventricular diastolic parameters are consistent with Grade II diastolic dysfunction (pseudonormalization). Elevated left ventricular end-diastolic pressure. The E/e' is 21.  2. Right ventricular systolic function is normal. The right ventricular size is mildly enlarged. There is mildly elevated pulmonary artery systolic pressure. The estimated right ventricular systolic pressure is 97.5 mmHg.  3. Left atrial size was mildly dilated.  4. The mitral valve is abnormal. Trivial mitral valve regurgitation.  5. The aortic valve is  tricuspid. Aortic valve regurgitation is not visualized.  6. The inferior vena cava is normal in size with <50% respiratory variability, suggesting right atrial pressure of 8 mmHg. Comparison(s): Changes from prior study are noted. 12/26/2021: LVEF 40-45%, RVSP 45.8 mmHg. FINDINGS  Left Ventricle: Left ventricular ejection fraction, by estimation, is 50 to 55%. The left ventricle has low normal function. The left ventricle has no regional wall motion abnormalities. The left ventricular internal cavity size was normal in size. There is no left ventricular hypertrophy. Left ventricular diastolic parameters are consistent with Grade II diastolic dysfunction (pseudonormalization). Elevated left ventricular end-diastolic pressure. The E/e' is 21. Right Ventricle: The right ventricular size is mildly enlarged. No increase in right ventricular wall thickness. Right ventricular systolic function is normal. There is mildly elevated pulmonary artery systolic pressure. The tricuspid regurgitant velocity is 2.75 m/s, and with an assumed right atrial pressure of 8 mmHg, the estimated right ventricular systolic pressure is 88.3 mmHg. Left Atrium: Left atrial size was mildly dilated. Right Atrium: Right atrial size was normal in size. Pericardium: There is no evidence of pericardial effusion. Mitral Valve: The mitral valve is abnormal. There is mild calcification of the posterior mitral valve leaflet(s). Trivial mitral  valve regurgitation. Tricuspid Valve: The tricuspid valve is grossly normal. Tricuspid valve regurgitation is trivial. Aortic Valve: The aortic valve is tricuspid. Aortic valve regurgitation is not visualized. Pulmonic Valve: The pulmonic valve was normal in structure. Pulmonic valve regurgitation is not visualized. Aorta: The aortic root and ascending aorta are structurally normal, with no evidence of dilitation. Venous: The inferior vena cava is normal in size with less than 50% respiratory variability, suggesting  right atrial pressure of 8 mmHg. IAS/Shunts: No atrial level shunt detected by color flow Doppler.  LEFT VENTRICLE PLAX 2D LVIDd:         4.50 cm     Diastology LVIDs:         3.20 cm     LV e' medial:    5.01 cm/s LV PW:         1.00 cm     LV E/e' medial:  21.2 LV IVS:        1.10 cm     LV e' lateral:   5.06 cm/s LVOT diam:     2.00 cm     LV E/e' lateral: 20.9 LV SV:         57 LV SV Index:   32 LVOT Area:     3.14 cm  LV Volumes (MOD) LV vol d, MOD A2C: 94.8 ml LV vol d, MOD A4C: 99.9 ml LV vol s, MOD A2C: 42.9 ml LV vol s, MOD A4C: 43.3 ml LV SV MOD A2C:     51.9 ml LV SV MOD A4C:     99.9 ml LV SV MOD BP:      55.3 ml RIGHT VENTRICLE RV S prime:     11.80 cm/s TAPSE (M-mode): 1.3 cm LEFT ATRIUM             Index        RIGHT ATRIUM           Index LA diam:        4.00 cm 2.26 cm/m   RA Area:     16.70 cm LA Vol (A2C):   62.2 ml 35.15 ml/m  RA Volume:   41.00 ml  23.17 ml/m LA Vol (A4C):   54.6 ml 30.85 ml/m LA Biplane Vol: 61.9 ml 34.98 ml/m  AORTIC VALVE LVOT Vmax:   85.40 cm/s LVOT Vmean:  55.400 cm/s LVOT VTI:    0.181 m  AORTA Ao Root diam: 3.10 cm Ao Asc diam:  3.20 cm MITRAL VALVE                TRICUSPID VALVE MV Area (PHT): 2.99 cm     TR Peak grad:   30.2 mmHg MV Decel Time: 254 msec     TR Vmax:        275.00 cm/s MV E velocity: 106.00 cm/s MV A velocity: 36.20 cm/s   SHUNTS MV E/A ratio:  2.93         Systemic VTI:  0.18 m                             Systemic Diam: 2.00 cm Lyman Bishop MD Electronically signed by Lyman Bishop MD Signature Date/Time: 05/20/2022/2:25:44 PM    Final    DG CHEST PORT 1 VIEW  Result Date: 05/19/2022 CLINICAL DATA:  Shortness of breath, respiratory distress EXAM: PORTABLE CHEST 1 VIEW COMPARISON:  05/19/2022 at 1123 hours FINDINGS: Small right pleural effusion, improved. Left lung is clear.  No frank interstitial edema. Cardiomegaly. Thoracic aortic atherosclerosis. Left subclavian pacemaker. IMPRESSION: Small right pleural effusion, improved. No frank  interstitial edema. Electronically Signed   By: Julian Hy M.D.   On: 05/19/2022 17:32   DG Chest 2 View  Result Date: 05/19/2022 CLINICAL DATA:  Shortness of breath. EXAM: CHEST - 2 VIEW COMPARISON:  12/25/2021 FINDINGS: Low volume film. Interval progression of right base collapse/consolidation with moderate right pleural effusion. Minimal atelectasis or infiltrate noted left base with probable tiny left pleural effusion. The cardio pericardial silhouette is enlarged. Dual lead permanent pacemaker evident. IMPRESSION: Interval progression of right base collapse/consolidation with moderate right pleural effusion. Electronically Signed   By: Misty Stanley M.D.   On: 05/19/2022 11:07    Microbiology: Results for orders placed or performed during the hospital encounter of 05/19/22  Urine Culture     Status: Abnormal   Collection Time: 05/26/22  6:12 AM   Specimen: Urine, Clean Catch  Result Value Ref Range Status   Specimen Description URINE, CLEAN CATCH  Final   Special Requests   Final    NONE Performed at [facility], [place] [address], [place],  [address]    Culture >=100,000 COLONIES/mL ESCHERICHIA COLI (A)  Final   Report Status 05/28/2022 FINAL  Final   Organism ID, Bacteria ESCHERICHIA COLI (A)  Final      Susceptibility   Escherichia coli - MIC*    AMPICILLIN >=32 RESISTANT Resistant     CEFAZOLIN <=4 SENSITIVE Sensitive     CEFEPIME <=0.12 SENSITIVE Sensitive     CEFTRIAXONE <=0.25 SENSITIVE Sensitive     CIPROFLOXACIN <=0.25 SENSITIVE Sensitive     GENTAMICIN <=1 SENSITIVE Sensitive     IMIPENEM <=0.25 SENSITIVE Sensitive     NITROFURANTOIN <=16 SENSITIVE Sensitive     TRIMETH/SULFA <=20 SENSITIVE Sensitive     AMPICILLIN/SULBACTAM 4 SENSITIVE Sensitive     PIP/TAZO <=4 SENSITIVE Sensitive     * >=100,000 COLONIES/mL ESCHERICHIA COLI    Labs: CBC: Recent Labs  Lab 06/04/22 0342 06/07/22 0222  WBC 8.3 7.2  HGB 10.6* 10.2*  HCT 32.7* 32.6*  MCV  88.4 89.1  PLT 389 741   Basic Metabolic Panel: Recent Labs  Lab 06/05/22 0220 06/06/22 0335 06/07/22 0222 06/08/22 0302 06/09/22 0304  NA 135 138 136 136 136  K 4.2 3.7 3.9 4.1 4.1  CL 97* 99 101 100 100  CO2 _0 GLUCOSE 145* 127* 80 122* 131*  BUN 50* 47* 44* 44* 40*  CREATININE 3.12* 3.01* 2.84* 2.66* 2.68*  CALCIUM 8.2* 8.3* 8.0* 8.3* 8.3*  PHOS 5.4* 4.9* 4.5 4.7* 4.8*   Liver Function Tests: Recent Labs  Lab 06/05/22 0220 06/06/22 0335 06/07/22 0222 06/08/22 0302 06/09/22 0304  ALBUMIN 2.4* 2.3* 2.3* 2.3* 2.3*   CBG: Recent Labs  Lab 06/08/22 1108 06/08/22 1542 06/08/22 2115 06/09/22 0651 06/09/22 1058  GLUCAP 155* 195* 167* 100* 154*    Discharge time spent: greater than 30 minutes.  Signed: Erin Hearing, NP [facility] 06/09/2022  The patient was seen and examined independently.  All labs plan of care and discharge was discussed with patient and NP in detail.  Agree with above discharge planning   SIGNED: Deatra James, MD, FHM. [facility],  Pager (please use [email or web address] to page/text)  Please use Epic Secure Chat for non-urgent communication (7AM-7PM) If 7PM-7AM, please contact night-coverage [email or web address],  06/09/2022, 3:00 PM

## 2022-06-09 NOTE — Progress Notes (Signed)
SATURATION QUALIFICATIONS: (This note is used to comply with regulatory documentation for home oxygen)  Patient Saturations on Room Air at Rest = 97%  Patient Saturations on Room Air while Ambulating = 94%   Please briefly explain why patient needs home oxygen:pt did not required supplemental oxygen while ambulating.

## 2022-06-09 NOTE — Progress Notes (Signed)
Physical Therapy Treatment Patient Details Name: Tracey Bryant MRN: [id number] DOB: [birth year] Today's Date: 06/09/2022   History of Present Illness The pt is a 71 yo female presenting from home 5/24 with SOB and BLE swelling. Found to have acute CHF exacerbation and R pleural effusion s/p thoracentesis 5/24.  Noted that pt was found to have subacute infarct left basal ganglia on 5/31 and Neuro was consulted.Underwent right-sided ultrasound guided thoracentesis on 6/2 and had R cholecystosomy catheter exchange performed in IR on 6/5.  PMH includes: arthritis, CHF, DM II, DVT, HTN, afib, and stroke.    PT Comments    Pt received supine and agreeable to session; pt continues to demonstrate some of the shaking episodes but able to mobilize OOB with min assist. Pt continues to benefit from engaged conversation and distraction throughout session and during mobilization with conversations about family and cat. Pt demonstrating jerky movements with tranfers and taking steps with min asssit provided for steadying and RW management; and one episode while marching LEs in place in sitting. Current plan remains appropriate to address deficits and maximize functional independence and decrease caregiver burden. Pt continues to benefit from skilled PT services to progress toward functional mobility goals.    Recommendations for follow up therapy are one component of a multi-disciplinary discharge planning process, led by the attending physician.  Recommendations may be updated based on patient status, additional functional criteria and insurance authorization.  Follow Up Recommendations  Skilled nursing-short term rehab (<3 hours/day)     Assistance Recommended at Discharge Frequent or constant Supervision/Assistance  Patient can return home with the following Two people to help with walking and/or transfers;Two people to help with bathing/dressing/bathroom;Assistance with cooking/housework;Direct  supervision/assist for medications management;Direct supervision/assist for financial management;Assist for transportation;Help with stairs or ramp for entrance   Equipment Recommendations  None recommended by PT    Recommendations for Other Services       Precautions / Restrictions Precautions Precautions: Fall Precaution Comments: psuedo-syncopal/shaking episodes this admission, not related to BP Restrictions Weight Bearing Restrictions: No     Mobility  Bed Mobility Overal bed mobility: Needs Assistance Bed Mobility: Rolling, Sidelying to Sit Rolling: Min guard Sidelying to sit: Min guard, HOB elevated       General bed mobility comments: increased time with complaints of right side pain    Transfers Overall transfer level: Needs assistance Equipment used: Rolling walker (2 wheels) Transfers: Sit to/from Stand, Bed to chair/wheelchair/BSC Sit to Stand: Min assist   Step pivot transfers: Min assist       General transfer comment: min assist from EOB>BSC>recline for RW management and steadying assist    Ambulation/Gait                   Stairs             Wheelchair Mobility    Modified Rankin (Stroke Patients Only)       Balance Overall balance assessment: Needs assistance Sitting-balance support: Feet supported, No upper extremity supported Sitting balance-Leahy Scale: Fair Sitting balance - Comments: episodes of jerky movement, with patient stating it was due to right side pain   Standing balance support: Single extremity supported, Bilateral upper extremity supported, During functional activity Standing balance-Leahy Scale: Poor Standing balance comment: able to stand at sink and perform grooming with one extremity                            Cognition Arousal/Alertness:  Awake/alert Behavior During Therapy: Anxious Overall Cognitive Status: Impaired/Different from baseline Area of Impairment: Memory, Problem solving                      Memory: Decreased short-term memory   Safety/Judgement: Decreased awareness of safety, Decreased awareness of deficits Awareness: Intellectual Problem Solving: Requires verbal cues General Comments: complaints of right side pain/discomfort, demonstrated jerky movements but safer with transfers        Exercises Other Exercises Other Exercises: seated marching x10, pt unable to continue 2/2 episodes    General Comments General comments (skin integrity, edema, etc.): VSS on RA      Pertinent Vitals/Pain Pain Assessment Pain Assessment: Faces Faces Pain Scale: Hurts little more Pain Location: right side Pain Descriptors / Indicators: Tightness, Grimacing, Discomfort Pain Intervention(s): Monitored during session, Repositioned    Home Living                          Prior Function            PT Goals (current goals can now be found in the care plan section) Acute Rehab PT Goals PT Goal Formulation: With patient Time For Goal Achievement: 06/16/22    Frequency    Min 3X/week      PT Plan Current plan remains appropriate    Co-evaluation              AM-PAC PT "6 Clicks" Mobility   Outcome Measure  Help needed turning from your back to your side while in a flat bed without using bedrails?: A Little Help needed moving from lying on your back to sitting on the side of a flat bed without using bedrails?: A Little Help needed moving to and from a bed to a chair (including a wheelchair)?: A Little Help needed standing up from a chair using your arms (e.g., wheelchair or bedside chair)?: A Little Help needed to walk in hospital room?: Total Help needed climbing 3-5 steps with a railing? : Total 6 Click Score: 14    End of Session Equipment Utilized During Treatment: Gait belt Activity Tolerance: Patient tolerated treatment well Patient left: in chair;with call bell/phone within reach;with chair alarm set Nurse Communication:  Mobility status PT Visit Diagnosis: Other abnormalities of gait and mobility (R26.89);Muscle weakness (generalized) (M62.81)     Time: 7622-6333 PT Time Calculation (min) (ACUTE ONLY): 20 min  Charges:  $Therapeutic Activity: 8-22 mins                     Janiyla Long R. PTA Acute Rehabilitation Services Office: [place] 06/09/2022, 9:31 AM

## 2022-06-09 NOTE — Progress Notes (Signed)
Mobility Specialist Progress Note:   06/09/22 1247  Mobility  Activity Transferred from chair to bed  Level of Assistance Minimal assist, patient does 75% or more  Assistive Device Front wheel walker  Distance Ambulated (ft) 4 ft  Activity Response Tolerated well  $Mobility charge 1 Mobility   Pt received in chair needing to get back in bed for bladder scan. Complaints of pain at the drain site. MinA to stand and step to bed. Left in bed with call bell in reach and all needs met.     Mobility Specialist  

## 2022-06-09 NOTE — TOC Transition Note (Signed)
Transition of Care [facility]) - CM/SW Discharge Note   Patient Details  Name: Tracey Bryant MRN: [id number] Date of Birth: 13-Feb-1951  [facility] CM/SW Contact:  Coralee Pesa, [place] Phone Number: 06/09/2022, 1:56 PM   Clinical Narrative:    Pt to be transported to [facility] rehab via [place]. Nurse to call report to [phone]   Final next level of care: Skilled Nursing Facility Barriers to Discharge: Barriers Resolved   Patient Goals and CMS Choice Patient states their goals for this hospitalization and ongoing recovery are:: Go home- to daughters house      Discharge Placement              Patient chooses bed at: [facility] Patient to be transferred to facility by: [facility] Name of family member notified: [name] Patient and family notified of of transfer: 06/09/22  Discharge Plan and Services   Discharge Planning Services: CM Consult                      HH Arranged: Refused [facility]          Social Determinants of Health (SDOH) Interventions     Readmission Risk Interventions    12/27/2021    2:32 PM 03/19/2021    9:34 AM 03/17/2021   11:56 AM  Readmission Risk Prevention Plan  Transportation Screening Complete  Complete  PCP or Specialist Appt within 5-7 Days  Complete   PCP or Specialist Appt within 3-5 Days Complete    Home Care Screening  Complete   Medication Review (RN CM)   Complete  HRI or Home Care Consult Complete    Social Work Consult for [place] Planning/Counseling Complete    Palliative Care Screening Not Applicable    Medication Review Press photographer) Complete

## 2022-06-09 NOTE — Progress Notes (Signed)
Nutrition Follow-up  DOCUMENTATION CODES:   Not applicable  INTERVENTION:   Continue Glucerna Shake po TID, each supplement provides 220 kcal and 10 grams of protein   Continue MVI with minerals daily   Chopped meats with meal trays for ease of intake   Encourage PO intake.  NUTRITION DIAGNOSIS:   Increased nutrient needs related to chronic illness (CHF) as evidenced by estimated needs; ongoing  GOAL:   Patient will meet greater than or equal to 90% of their needs; met  MONITOR:   PO intake, Supplement acceptance, I & O's, Weight trends, Labs  REASON FOR ASSESSMENT:   Consult Other (Comment) ("nutritional goals")  ASSESSMENT:   71 year old female who presented to the ED on 5/24 with SOB. PMH of PAF, HTN, DM, CHF, CVA. Pt admitted with acute on chronic systolic CHF, R pleural effusion.  Meal completion has been 75-100%. Pt continues to receive chopped meats at meal trays to ease po intake. Pt reports having a good appetite with no difficulties. Pt currently has Glucerna shake and has been consuming them. Plans to discharge to [facility] rehab today. Recommend continuation of nutritional supplements to aid in caloric and protein needs.   Labs and medications reviewed.   Diet Order:   Diet Order             Diet heart healthy/carb modified Room service appropriate? Yes; Fluid consistency: Thin; Fluid restriction: 1500 mL Fluid  Diet effective now                   EDUCATION NEEDS:   Education needs have been addressed  Skin:  Skin Assessment: Reviewed RN Assessment  Last BM:  6/13  Height:   Ht Readings from Last 1 Encounters:  05/19/22 _0  (1.676 m)    Weight:   Wt Readings from Last 1 Encounters:  06/09/22 79.8 kg   BMI:  Body mass index is 28.41 kg/m.  Estimated Nutritional Needs:   Kcal:  1700-1900  Protein:  85-100 grams  Fluid:  1.5 L/day  Corrin Parker, MS, RD, LDN RD pager number/after hours weekend pager number on Amion.

## 2022-06-09 NOTE — Progress Notes (Signed)
Patient ID: Mirtha D Mruk, female   DOB: 1951-03-09, 71 y.o.   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  Scr had been slowly improving back to her baseline but then started climbing again.  Lasix dose has been decreased and finally stopped 06/02/22 (but she did get am dose).   Scr not significantly changed over the past 5 days.  UOP starting to decline.  Started NS at 50 ml/hr 06/05/22 but now with SOB.  Stopped IVF's despite that she is orthostatic and is 16 liters negative since admission. No indication for dialysis at this time.  Pt is a marginal candidate given her poor functional status. Avoid nephrotoxic medications including NSAIDs and iodinated intravenous contrast exposure unless the latter is absolutely indicated.   Preferred narcotic agents for pain control are hydromorphone, fentanyl, and methadone. Morphine should not be used. Avoid Baclofen and avoid oral sodium phosphate and magnesium citrate based laxatives / bowel preps.  Continue strict Input and Output monitoring. Will monitor the patient closely with you and intervene or adjust therapy as indicated by changes in clinical status/labs. Renal function stable.  Restart oral diuretics with lasix 40 BID (1st dose was evening of 6/13); continue for now. TED hoses as well. Check post void as well.     Acute on chronic systolic CHF - stable, improved volume status. Recurrent right pleural effusion - s/p thoracentesis on 5/24 and again 05/28/22 with improved breathing. Subacute stroke - presented with dizziness and seen by MRI on 05/26/22 to left inferior basal ganglia.  Neurology consulted. Orthostasis - lasix discontinued and continue to work with PT/OT.  Still had episode of near syncope upon sitting up.  concerning for psychogenic in nature.  Have ordered TED hose, abdominal binder, low dose midodrine to see if this will help.  May need to alter  hydralazine dose as well since it can cause orthostatic hypotension.  Chronic cholecystitis - per IR. Deconditioning - unable to work with PT due to orthostasis.  Difficult situation as she is not getting better on multiple levels.    S: No events overnight. UOP 1200 /450 24 hr x2 and mildly dyspneic with exertion but much better than at admission. Main complaint is pain at drain site in right side of abd.   O:BP (!) 126/54 (BP Location: Left Arm)   Pulse 62   Temp 97.8 F (36.6 C) (Oral)   Resp 18   Ht '5\' 6"'$  (1.676 m)   Wt 79.8 kg Comment: scale b  SpO2 95%   BMI 28.41 kg/m   Intake/Output Summary (Last 24 hours) at 06/09/2022 1135 Last data filed at 06/09/2022 0816 Gross per 24 hour  Intake 1452 ml  Output 1430 ml  Net 22 ml   Intake/Output: I/O last 3 completed shifts: In: 1945 [P.O.:1672; I.V.:8; Other:265] Out: 2005 [Urine:1100; Drains:905]  Intake/Output this shift:  Total I/O In: 236 [P.O.:236] Out: 450 [Urine:450] Weight change: -0.635 kg Gen:NAD CVS: RRR Resp:CTA Abd: +BS, soft, NT/ND Ext: trace presacral edema GU: purewick  Recent Labs  Lab 06/03/22 0315 06/04/22 0342 06/05/22 0220 06/06/22 0335 06/07/22 0222 06/08/22 0302 06/09/22 0304  NA 135 134* 135 138 136 136 136  K 4.0 4.2 4.2 3.7 3.9 4.1 4.1  CL 95* 93* 97* 99 101 100 100  CO2 32 '27 30 29 28 27 26  '$ GLUCOSE 124* 124* 145* 127* 80 122* 131*  BUN 46* 53* 50* 47* 44* 44*  40*  CREATININE 3.11* 2.98* 3.12* 3.01* 2.84* 2.66* 2.68*  ALBUMIN 2.1* 2.3* 2.4* 2.3* 2.3* 2.3* 2.3*  CALCIUM 8.3* 8.3* 8.2* 8.3* 8.0* 8.3* 8.3*  PHOS 5.3* 4.9* 5.4* 4.9* 4.5 4.7* 4.8*   Liver Function Tests: Recent Labs  Lab 06/07/22 0222 06/08/22 0302 06/09/22 0304  ALBUMIN 2.3* 2.3* 2.3*   No results for input(s): "LIPASE", "AMYLASE" in the last 168 hours. No results for input(s): "AMMONIA" in the last 168 hours. CBC: Recent Labs  Lab 06/04/22 0342 06/07/22 0222  WBC 8.3 7.2  HGB 10.6* 10.2*  HCT 32.7*  32.6*  MCV 88.4 89.1  PLT 389 392   Cardiac Enzymes: No results for input(s): "CKTOTAL", "CKMB", "CKMBINDEX", "TROPONINI" in the last 168 hours. CBG: Recent Labs  Lab 06/08/22 1108 06/08/22 1542 06/08/22 2115 06/09/22 0651 06/09/22 1058  GLUCAP 155* 195* 167* 100* 154*    Iron Studies: No results for input(s): "IRON", "TIBC", "TRANSFERRIN", "FERRITIN" in the last 72 hours. Studies/Results: No results found.  apixaban  5 mg Oral BID   carvedilol  3.125 mg Oral BID WC   docusate sodium  100 mg Oral BID   feeding supplement (GLUCERNA SHAKE)  237 mL Oral TID BM   furosemide  40 mg Oral BID   hydrALAZINE  50 mg Oral Q8H   hydrocortisone cream   Topical BID   insulin aspart  0-15 Units Subcutaneous TID WC   insulin aspart  0-5 Units Subcutaneous QHS   insulin aspart  3 Units Subcutaneous TID WC   insulin glargine-yfgn  15 Units Subcutaneous Daily   isosorbide mononitrate  15 mg Oral Daily   midodrine  2.5 mg Oral TID WC   multivitamin with minerals  1 tablet Oral Daily   sodium chloride flush  3 mL Intravenous Q12H   sodium chloride flush  5 mL Intracatheter Q8H    BMET    Component Value Date/Time   NA 136 06/09/2022 0304   NA 141 05/04/2022 1625   K 4.1 06/09/2022 0304   CL 100 06/09/2022 0304   CO2 26 06/09/2022 0304   GLUCOSE 131 (H) 06/09/2022 0304   BUN 40 (H) 06/09/2022 0304   BUN 18 05/04/2022 1625   CREATININE 2.68 (H) 06/09/2022 0304   CALCIUM 8.3 (L) 06/09/2022 0304   GFRNONAA 19 (L) 06/09/2022 0304   GFRAA >90 01/17/2013 1915   CBC    Component Value Date/Time   WBC 7.2 06/07/2022 0222   RBC 3.66 (L) 06/07/2022 0222   HGB 10.2 (L) 06/07/2022 0222   HCT 32.6 (L) 06/07/2022 0222   PLT 392 06/07/2022 0222   MCV 89.1 06/07/2022 0222   MCH 27.9 06/07/2022 0222   MCHC 31.3 06/07/2022 0222   RDW 15.9 (H) 06/07/2022 0222   LYMPHSABS 1.2 05/27/2022 0402   MONOABS 0.6 05/27/2022 0402   EOSABS 0.4 05/27/2022 0402   BASOSABS 0.0 05/27/2022 0402

## 2022-06-09 NOTE — Plan of Care (Signed)
?  Problem: Activity: ?Goal: Capacity to carry out activities will improve ?Outcome: Progressing ?  ?

## 2022-06-10 NOTE — Progress Notes (Unsigned)
Electrophysiology Office Note Date: 06/15/2022  ID:  Tracey Bryant, DOB 11/28/1951, MRN [id number]  PCP: Coolidge Breeze, FNP Primary Cardiologist: Minus Breeding, MD Electrophysiologist: Cristopher Peru, MD   CC: Pacemaker follow-up  Tracey Bryant is a 71 y.o. female seen today for Cristopher Peru, MD for post hospital follow up.   Admitted 5/24 - 6/14 with SOB and volume overload. Had thoracentesis with 1.3L removed. She diuresed ~16L of fluid during the admission. Neprhology assisted in management with aggressive diuresis. Ultimately was discharged to SNF.   Since leaving the hospital the patient reports doing well.  she denies chest pain, palpitations, dyspnea, PND, orthopnea, nausea, vomiting, dizziness, syncope, edema, weight gain, or early satiety.  Device History: Medtronic Dual Chamber PPM implanted 05/19/2020  Past Medical History:  Diagnosis Date   Arthritis    Chronic systolic (congestive) heart failure (HCC)    Diabetes mellitus without complication (HCC)    DVT (deep venous thrombosis) ([place]) 07/26/2021   Hypertension    Paroxysmal atrial fibrillation ([place]) 07/26/2021   Stroke [facility])    Past Surgical History:  Procedure Laterality Date   breast tumor     IR CATHETER TUBE CHANGE  09/25/2021   IR CHOLANGIOGRAM EXISTING TUBE  05/13/2021   IR EXCHANGE BILIARY DRAIN  07/27/2021   IR EXCHANGE BILIARY DRAIN  11/04/2021   IR EXCHANGE BILIARY DRAIN  01/08/2022   IR EXCHANGE BILIARY DRAIN  05/31/2022   IR EXCHANGE BILIARY DRAIN  06/12/2022   IR RADIOLOGIST EVAL & MGMT  09/04/2021   IR REMOVAL OF CALCULI/DEBRIS BILIARY DUCT/GB  09/25/2021   IR REMOVAL OF CALCULI/DEBRIS BILIARY DUCT/GB  11/04/2021   IR REMOVAL OF CALCULI/DEBRIS BILIARY DUCT/GB  01/08/2022   IR THORACENTESIS ASP PLEURAL SPACE W/IMG GUIDE  05/28/2022   PACEMAKER IMPLANT     TONSILLECTOMY      Current Outpatient Medications  Medication Sig Dispense Refill   acetaminophen (TYLENOL) 325 MG tablet Take 2 tablets (650 mg  total) by mouth every 6 (six) hours as needed for mild pain (or Fever >/= 101).     apixaban (ELIQUIS) 5 MG TABS tablet Take 1 tablet (5 mg total) by mouth 2 (two) times daily. 60 tablet 0   carvedilol (COREG) 3.125 MG tablet Take 1 tablet (3.125 mg total) by mouth 2 (two) times daily with a meal.     docusate sodium (COLACE) 100 MG capsule Take 1 capsule (100 mg total) by mouth 2 (two) times daily. 10 capsule 0   feeding supplement, GLUCERNA SHAKE, (GLUCERNA SHAKE) LIQD Take 237 mLs by mouth 3 (three) times daily between meals.  0   furosemide (LASIX) 40 MG tablet Take 1 tablet (40 mg total) by mouth 2 (two) times daily. 180 tablet 3   hydrALAZINE (APRESOLINE) 50 MG tablet Take 1 tablet (50 mg total) by mouth every 8 (eight) hours.     hydrocortisone cream 1 % Apply topically 2 (two) times daily. 30 g 0   hydrOXYzine (ATARAX) 10 MG tablet Take 1 tablet (10 mg total) by mouth 3 (three) times daily as needed for itching. 30 tablet 0   insulin aspart (NOVOLOG) 100 UNIT/ML injection Inject 0-15 Units into the skin 3 (three) times daily with meals. Do NOT hold if patient is NPO. Moderate Scale. 10 mL 11   insulin aspart (NOVOLOG) 100 UNIT/ML injection Inject 0-5 Units into the skin at bedtime. Do NOT hold insulin if patient is NPO. 10 mL 11   insulin aspart (NOVOLOG) 100 UNIT/ML  injection Inject 3 Units into the skin 3 (three) times daily with meals. 10 mL 11   insulin glargine-yfgn (SEMGLEE) 100 UNIT/ML injection Inject 0.15 mLs (15 Units total) into the skin daily. 10 mL 11   isosorbide mononitrate (IMDUR) 30 MG 24 hr tablet Take 0.5 tablets (15 mg total) by mouth daily.     midodrine (PROAMATINE) 2.5 MG tablet Take 1 tablet (2.5 mg total) by mouth 3 (three) times daily with meals.     Multiple Vitamin (MULTIVITAMIN WITH MINERALS) TABS tablet Take 1 tablet by mouth daily.     polyethylene glycol (MIRALAX / GLYCOLAX) 17 g packet Take 17 g by mouth daily as needed for mild constipation. 14 each 0    sodium chloride flush (NS) 0.9 % SOLN 5 mLs by Intracatheter route every 8 (eight) hours.     traZODone (DESYREL) 50 MG tablet Take 0.5 tablets (25 mg total) by mouth at bedtime as needed for sleep.     No current facility-administered medications for this visit.    Allergies:   Patient has no known allergies.   Social History: Social History   Socioeconomic History   Marital status: Divorced    Spouse name: Not on file   Number of children: Not on file   Years of education: Not on file   Highest education level: Not on file  Occupational History   Occupation: retired  Tobacco Use   Smoking status: Never   Smokeless tobacco: Never  Vaping Use   Vaping Use: Never used  Substance and Sexual Activity   Alcohol use: No   Drug use: Never   Sexual activity: Not on file  Other Topics Concern   Not on file  Social History Narrative   Not on file   Social Determinants of Health   Financial Resource Strain: Not on file  Food Insecurity: Not on file  Transportation Needs: Not on file  Physical Activity: Not on file  Stress: Not on file  Social Connections: Not on file  Intimate Partner Violence: Not on file    Family History: Family History  Problem Relation Age of Onset   Cancer Mother    Diabetes Mother    Heart disease Father      Review of Systems: All other systems reviewed and are otherwise negative except as noted above.  Physical Exam: Vitals:   06/15/22 0907  BP: 138/60  Pulse: 83  SpO2: 97%  Weight: 167 lb 9.6 oz (76 kg)  Height: '5\' 6"'$  (1.676 m)     GEN- The patient is well appearing, alert and oriented x 3 today.   HEENT: normocephalic, atraumatic; sclera clear, conjunctiva pink; hearing intact; oropharynx clear; neck supple  Lungs- Clear to ausculation bilaterally, normal work of breathing.  No wheezes, rales, rhonchi Heart- Regular rate and rhythm, no murmurs, rubs or gallops  GI- soft, non-tender, non-distended, bowel sounds present   Extremities- no clubbing or cyanosis. No edema MS- no significant deformity or atrophy Skin- warm and dry, no rash or lesion; PPM pocket well healed Psych- euthymic mood, full affect Neuro- strength and sensation are intact  PPM Interrogation- reviewed in detail today,  See PACEART report  EKG:  EKG is not ordered today. Personal review of ekg ordered  05/19/2022  shows A paced V-sensed rhythm   Recent Labs: 05/04/2022: NT-Pro BNP 15,044 05/19/2022: B Natriuretic Peptide 2,083.9 05/21/2022: ALT 9 05/30/2022: Magnesium 2.7 06/07/2022: Hemoglobin 10.2; Platelets 392 06/09/2022: BUN 40; Creatinine, Ser 2.68; Potassium 4.1; Sodium 136  Wt Readings from Last 3 Encounters:  06/15/22 167 lb 9.6 oz (76 kg)  06/12/22 174 lb 2.6 oz (79 kg)  06/09/22 176 lb (79.8 kg)     Other studies Reviewed: Additional studies/ records that were reviewed today include: Previous EP office notes, Previous remote checks, Most recent labwork.   Assessment and Plan:  1. SND s/p Medtronic PPM  Normal PPM function See Pace Art report No changes today  2. Chronic CHF, EF resolved Echo 05/20/2022 LVEF 50-55% Volume status stable on exam.  3. PAF Continue eliquis for CHA2DS2VASc of at least 7. Low burden by device.   4. HTN Stable on current regimen   Current medicines are reviewed at length with the patient today.    Labs/ tests ordered today include:  Orders Placed This Encounter  Procedures   Basic metabolic panel   CBC     Disposition:   Follow up with EP APP in 6 months    Signed, Shirley Friar, PA-C  06/15/2022 9:26 AM  [facility] [address] [place]  [place] [address] [phone] (office) [phone] (fax)

## 2022-06-12 ENCOUNTER — Other Ambulatory Visit: Payer: Self-pay

## 2022-06-12 ENCOUNTER — Emergency Department (HOSPITAL_COMMUNITY): Payer: Medicare HMO

## 2022-06-12 ENCOUNTER — Encounter (HOSPITAL_COMMUNITY): Payer: Self-pay | Admitting: Emergency Medicine

## 2022-06-12 ENCOUNTER — Emergency Department (HOSPITAL_COMMUNITY)
Admission: EM | Admit: 2022-06-12 | Discharge: 2022-06-12 | Disposition: A | Discharge status: Home / Self Care | Payer: Medicare HMO | Attending: Emergency Medicine | Admitting: Emergency Medicine

## 2022-06-12 DIAGNOSIS — Y732 Prosthetic and other implants, materials and accessory gastroenterology and urology devices associated with adverse incidents: Secondary | ICD-10-CM | POA: Diagnosis not present

## 2022-06-12 DIAGNOSIS — K819 Cholecystitis, unspecified: Secondary | ICD-10-CM

## 2022-06-12 DIAGNOSIS — T85518A Breakdown (mechanical) of other gastrointestinal prosthetic devices, implants and grafts, initial encounter: Secondary | ICD-10-CM | POA: Diagnosis present

## 2022-06-12 HISTORY — PX: IR EXCHANGE BILIARY DRAIN: IMG6046

## 2022-06-12 HISTORY — PX: IR CATHETER TUBE CHANGE: IMG717

## 2022-06-12 MED ORDER — LIDOCAINE HCL 1 % IJ SOLN
INTRAMUSCULAR | Status: DC | PRN
  Administered 2022-06-12: 5 mL

## 2022-06-12 MED ORDER — FUROSEMIDE 20 MG PO TABS: 40.0000 mg | ORAL_TABLET | Freq: Two times a day (BID) | ORAL | Status: DC

## 2022-06-12 MED ORDER — CARVEDILOL 3.125 MG PO TABS: 3.1250 mg | ORAL_TABLET | Freq: Two times a day (BID) | ORAL | Status: DC

## 2022-06-12 MED ORDER — MIDODRINE HCL 5 MG PO TABS: 2.5000 mg | ORAL_TABLET | Freq: Three times a day (TID) | ORAL | Status: DC

## 2022-06-12 MED ORDER — HYDRALAZINE HCL 25 MG PO TABS: 50.0000 mg | ORAL_TABLET | Freq: Three times a day (TID) | ORAL | Status: DC

## 2022-06-12 MED ORDER — IOHEXOL 300 MG/ML  SOLN: 100.0000 mL | Freq: Once | INTRAMUSCULAR | Status: DC | PRN

## 2022-06-12 MED ORDER — DOCUSATE SODIUM 100 MG PO CAPS: 100.0000 mg | ORAL_CAPSULE | Freq: Two times a day (BID) | ORAL | Status: DC

## 2022-06-12 MED ORDER — APIXABAN 5 MG PO TABS
5.0000 mg | ORAL_TABLET | Freq: Two times a day (BID) | ORAL | Status: DC
Start: 2022-06-12 — End: 2022-06-13

## 2022-06-12 MED ORDER — LIDOCAINE HCL 1 % IJ SOLN
INTRAMUSCULAR | Status: AC
  Filled 2022-06-12: qty 20

## 2022-06-12 NOTE — ED Notes (Signed)
Report given to [facility]. 

## 2022-06-12 NOTE — ED Triage Notes (Signed)
Pt woke this morning soaked due to leaking of cholecystostomy tube. Stitches appear to be loose and tube has pulled out too far. Per EMS nursing facility changed dressing yesterday. Pt denies any pain. Slight nausea in ambulance on way to ED but has improved.

## 2022-06-12 NOTE — Procedures (Addendum)
Interventional Radiology Procedure Note  Procedure: Cholecystostomy tube exchange  Findings: Please refer to procedural dictation for full description. Fractured external portion of drain.  Exchanged over wire for new, 14 Fr biliary drain.  Placed back to bag drainage.  Complications: None immediate  Estimated Blood Loss: < 5 ml  Recommendations: Keep to bag drainage.   Keep routine biliary drain check/change in IR in 3 months.  Ruthann Cancer, MD Pager: [phone]

## 2022-06-12 NOTE — ED Notes (Signed)
[facility] has been called  

## 2022-06-12 NOTE — ED Provider Notes (Signed)
[facility] Provider Note   CSN: [id number] Arrival date & time: 06/12/22  0740     History  Chief Complaint  Patient presents with   Dressing Change    Cholecystostomy tube leaking     Tracey Bryant is a 71 y.o. female.  HPI Patient presents with nursing home.  Recently arrived there.  Found to have now leaking from cholecystostomy tube.  Had some pain earlier but states she feels better now.  Slight nausea.  No fevers.  Has a chronic drain in.  Recent admission to the hospital and had not changed at that time.  No fevers.    Home Medications Prior to Admission medications   Medication Sig Start Date End Date Taking? Authorizing Provider  acetaminophen (TYLENOL) 325 MG tablet Take 2 tablets (650 mg total) by mouth every 6 (six) hours as needed for mild pain (or Fever >/= 101). 06/09/22   Samella Parr, NP  apixaban (ELIQUIS) 5 MG TABS tablet Take 1 tablet (5 mg total) by mouth 2 (two) times daily. Patient not taking: Reported on 05/19/2022 08/04/21   Pattricia Boss, MD  carvedilol (COREG) 3.125 MG tablet Take 1 tablet (3.125 mg total) by mouth 2 (two) times daily with a meal. 06/09/22   Samella Parr, NP  docusate sodium (COLACE) 100 MG capsule Take 1 capsule (100 mg total) by mouth 2 (two) times daily. 06/09/22   Samella Parr, NP  feeding supplement, GLUCERNA SHAKE, (GLUCERNA SHAKE) LIQD Take 237 mLs by mouth 3 (three) times daily between meals. 06/09/22   Samella Parr, NP  furosemide (LASIX) 40 MG tablet Take 1 tablet (40 mg total) by mouth 2 (two) times daily. 05/04/22   Bhagat, Crista Luria, PA  hydrALAZINE (APRESOLINE) 50 MG tablet Take 1 tablet (50 mg total) by mouth every 8 (eight) hours. 06/09/22   Samella Parr, NP  hydrocortisone cream 1 % Apply topically 2 (two) times daily. 06/09/22   Samella Parr, NP  hydrOXYzine (ATARAX) 10 MG tablet Take 1 tablet (10 mg total) by mouth 3 (three) times daily as needed for itching. 06/09/22   Samella Parr, NP  insulin aspart (NOVOLOG) 100 UNIT/ML injection Inject 0-15 Units into the skin 3 (three) times daily with meals. Do NOT hold if patient is NPO. Moderate Scale. 06/09/22   Samella Parr, NP  insulin aspart (NOVOLOG) 100 UNIT/ML injection Inject 0-5 Units into the skin at bedtime. Do NOT hold insulin if patient is NPO. 06/09/22   Samella Parr, NP  insulin aspart (NOVOLOG) 100 UNIT/ML injection Inject 3 Units into the skin 3 (three) times daily with meals. 06/09/22   Samella Parr, NP  insulin glargine-yfgn (SEMGLEE) 100 UNIT/ML injection Inject 0.15 mLs (15 Units total) into the skin daily. 06/10/22   Samella Parr, NP  isosorbide mononitrate (IMDUR) 30 MG 24 hr tablet Take 0.5 tablets (15 mg total) by mouth daily. 06/10/22   Samella Parr, NP  midodrine (PROAMATINE) 2.5 MG tablet Take 1 tablet (2.5 mg total) by mouth 3 (three) times daily with meals. 06/09/22   Samella Parr, NP  Multiple Vitamin (MULTIVITAMIN WITH MINERALS) TABS tablet Take 1 tablet by mouth daily. 06/10/22   Samella Parr, NP  polyethylene glycol (MIRALAX / GLYCOLAX) 17 g packet Take 17 g by mouth daily as needed for mild constipation. 06/09/22   Samella Parr, NP  sodium chloride flush (NS) 0.9 % SOLN 5 mLs by Intracatheter route  every 8 (eight) hours. 06/09/22   Samella Parr, NP  traZODone (DESYREL) 50 MG tablet Take 0.5 tablets (25 mg total) by mouth at bedtime as needed for sleep. 06/09/22   Samella Parr, NP      Allergies    Patient has no known allergies.    Review of Systems   Review of Systems  Physical Exam Updated Vital Signs BP (!) 171/95 (BP Location: Left Arm)   Pulse 64   Temp 97.9 F (36.6 C) (Oral)   Resp 15   Ht '5\' 6"'$  (1.676 m)   Wt 79 kg   SpO2 95%   BMI 28.11 kg/m  Physical Exam Vitals reviewed.  Constitutional:      Appearance: Normal appearance.  Abdominal:     Tenderness: There is no abdominal tenderness.     Comments: Percutaneous drain right upper  quadrant.  Does have bilious drainage from the tube and an area where it is disconnected.  No abdominal tenderness.  Skin:    General: Skin is warm.  Neurological:     Mental Status: She is alert.      ED Results / Procedures / Treatments   Labs (all labs ordered are listed, but only abnormal results are displayed) Labs Reviewed - No data to display  EKG None  Radiology No results found.  Procedures Procedures    Medications Ordered in ED Medications - No data to display  ED Course/ Medical Decision Making/ A&P                           Medical Decision Making  Patient presented for leaking cholecystostomy tube.  Appears to have disconnection between the tube and the hub.  Discussed with Dr. Serafina Royals, from interventional radiology.  Will need exchange but will need to be done at [facility].  We will transfer down to ER since they have some urgent cases that she cannot wait at interventional radiology for them.  I have clamped the tube and will be transferred to the ER at [facility].  Discussed with Dr. Almyra Free in the ER who also accepted the patient.        Final Clinical Impression(s) / ED Diagnoses Final diagnoses:  Cholecystostomy tube dysfunction, initial encounter    Rx / DC Orders ED Discharge Orders     None         Davonna Belling, MD 06/12/22 [phone]

## 2022-06-12 NOTE — ED Notes (Signed)
Diet ordered 

## 2022-06-14 ENCOUNTER — Encounter (HOSPITAL_COMMUNITY): Payer: Self-pay

## 2022-06-15 ENCOUNTER — Encounter: Payer: Self-pay | Admitting: Student

## 2022-06-15 ENCOUNTER — Ambulatory Visit (INDEPENDENT_AMBULATORY_CARE_PROVIDER_SITE_OTHER): Payer: Medicare HMO | Admitting: Student

## 2022-06-15 VITALS — BP 138/60 | HR 83 | Ht 66.0 in | Wt 167.6 lb

## 2022-06-15 DIAGNOSIS — I10 Essential (primary) hypertension: Secondary | ICD-10-CM

## 2022-06-15 DIAGNOSIS — I495 Sick sinus syndrome: Secondary | ICD-10-CM | POA: Diagnosis not present

## 2022-06-15 DIAGNOSIS — Z79899 Other long term (current) drug therapy: Secondary | ICD-10-CM | POA: Diagnosis not present

## 2022-06-15 DIAGNOSIS — I480 Paroxysmal atrial fibrillation: Secondary | ICD-10-CM | POA: Diagnosis not present

## 2022-06-15 DIAGNOSIS — I5022 Chronic systolic (congestive) heart failure: Secondary | ICD-10-CM

## 2022-06-15 LAB — CUP PACEART INCLINIC DEVICE CHECK
Battery Remaining Longevity: 137 mo
Battery Voltage: 3.03 V
Brady Statistic AP VP Percent: 0.05 %
Brady Statistic AP VS Percent: 99.58 %
Brady Statistic AS VP Percent: 0 %
Brady Statistic AS VS Percent: 0.37 %
Brady Statistic RA Percent Paced: 99.66 %
Brady Statistic RV Percent Paced: 0.05 %
Date Time Interrogation Session: 20230620100339
Implantable Lead Implant Date: 20210524
Implantable Lead Implant Date: 20210524
Implantable Lead Location: 753859
Implantable Lead Location: 753860
Implantable Lead Model: 5076
Implantable Lead Model: 5076
Implantable Pulse Generator Implant Date: 20210524
Lead Channel Impedance Value: 285 Ohm
Lead Channel Impedance Value: 323 Ohm
Lead Channel Impedance Value: 437 Ohm
Lead Channel Impedance Value: 437 Ohm
Lead Channel Pacing Threshold Amplitude: 0.75 V
Lead Channel Pacing Threshold Amplitude: 1 V
Lead Channel Pacing Threshold Pulse Width: 0.4 ms
Lead Channel Pacing Threshold Pulse Width: 0.4 ms
Lead Channel Sensing Intrinsic Amplitude: 22 mV
Lead Channel Sensing Intrinsic Amplitude: 25.375 mV
Lead Channel Sensing Intrinsic Amplitude: 3.5 mV
Lead Channel Sensing Intrinsic Amplitude: 4.625 mV
Lead Channel Setting Pacing Amplitude: 2 V
Lead Channel Setting Pacing Amplitude: 2 V
Lead Channel Setting Pacing Pulse Width: 0.4 ms
Lead Channel Setting Sensing Sensitivity: 0.9 mV

## 2022-06-15 NOTE — Patient Instructions (Signed)
Medication Instructions:  Your physician recommends that you continue on your current medications as directed. Please refer to the Current Medication list given to you today.  *If you need a refill on your cardiac medications before your next appointment, please call your pharmacy*   Lab Work: TODAY: BMET, CBC  If you have labs (blood work) drawn today and your tests are completely normal, you will receive your results only by: [place] (if you have MyChart) OR A paper copy in the mail If you have any lab test that is abnormal or we need to change your treatment, we will call you to review the results.   Follow-Up: At [facility], you and your health needs are our priority.  As part of our continuing mission to provide you with exceptional heart care, we have created designated Provider Care Teams.  These Care Teams include your primary Cardiologist (physician) and Advanced Practice Providers (APPs -  Physician Assistants and Nurse Practitioners) who all work together to provide you with the care you need, when you need it.   Your next appointment:   6 month(s)  The format for your next appointment:   In Person  Provider:   Legrand Como "Oda Kilts, PA-C{

## 2022-06-16 LAB — CBC
Hematocrit: 37.7 % (ref 34.0–46.6)
Hemoglobin: 11.9 g/dL (ref 11.1–15.9)
MCH: 28 pg (ref 26.6–33.0)
MCHC: 31.6 g/dL (ref 31.5–35.7)
MCV: 89 fL (ref 79–97)
Platelets: 424 10*3/uL (ref 150–450)
RBC: 4.25 x10E6/uL (ref 3.77–5.28)
RDW: 14.7 % (ref 11.7–15.4)
WBC: 6.6 10*3/uL (ref 3.4–10.8)

## 2022-06-16 LAB — BASIC METABOLIC PANEL
BUN/Creatinine Ratio: 15 (ref 12–28)
BUN: 35 mg/dL — ABNORMAL HIGH (ref 8–27)
CO2: 20 mmol/L (ref 20–29)
Calcium: 8.9 mg/dL (ref 8.7–10.3)
Chloride: 102 mmol/L (ref 96–106)
Creatinine, Ser: 2.36 mg/dL — ABNORMAL HIGH (ref 0.57–1.00)
Glucose: 158 mg/dL — ABNORMAL HIGH (ref 70–99)
Potassium: 4.1 mmol/L (ref 3.5–5.2)
Sodium: 140 mmol/L (ref 134–144)
eGFR: 22 mL/min/{1.73_m2} — ABNORMAL LOW (ref >59)

## 2022-06-27 DIAGNOSIS — I5031 Acute diastolic (congestive) heart failure: Secondary | ICD-10-CM | POA: Insufficient documentation

## 2022-06-27 NOTE — Progress Notes (Deleted)
Cardiology Office Note   Date:  06/27/2022   ID:  Tracey Bryant, DOB 1951/05/12, MRN [id number]  PCP:  Coolidge Breeze, FNP  Cardiologist:   Minus Breeding, MD Referring:  ***  No chief complaint on file.    History of Present Illness: Tracey Bryant is a 71 y.o. female who presents for follow up of atrial fib and volume overload. She had a cholecystostomy tube in place (initially placed 03/19/2021), afib, HTN, DMII, sCHF, CVA who presented with shortness of breath.  She also had reported increased lower extremity swelling/overload with no improvement on her home Lasix. She underwent thoracentesis on 05/19/2022 removing 1.3 L.  Initially Lasix was held after developing some dizziness. Respiratory status improved after thoracentesis.   ***      with PMH cholecystitis with cholecystostomy tube in place (initially placed 03/19/2021), afib, HTN, DMII, sCHF, CVA who presented with shortness of breath.  She also had reported increased lower extremity swelling/overload with no improvement on her home Lasix.  Subsequently underwent thoracentesis with 1.3 L removed.  Respiratory status improved after thoracentesis and patient was stable on room air.  Due to dizziness Lasix held but eventually resumed.  During hospitalization there was some difficulty with flushing her cholecystostomy tube and this was eventually exchanged.  Patient diuresed a total of 16 L secondary to acute on chronic heart failure exacerbation.  Due to above-stated dizziness (which was later found to be related to orthostatic hypotension), Lasix had been initiated stopped and then reinitiated during the hospitalization.  Last resumed on 6/13.  Because of patient's chronic kidney disease nephrology was consulted to assist with management giving requirement for aggressive diuresis.  Nephrology recommendations as below in chronic kidney disease summation.   Patient found to be significantly physically deconditioned and was requiring 2+  assist with mobility.  This was also negatively impacted by her ongoing orthostatic hypotension.  Despite use of TED hose, abdominal binder and initiation of midodrine patient continued have difficulty mobilizing even with 2+ assist and with rolling walker.  Recommendation made for SNF placement for rehabilitative therapy      Past Medical History:  Diagnosis Date   Arthritis    Chronic systolic (congestive) heart failure (HCC)    Diabetes mellitus without complication ([place])    DVT (deep venous thrombosis) ([place]) 07/26/2021   Hypertension    Paroxysmal atrial fibrillation ([place]) 07/26/2021   Stroke [facility])     Past Surgical History:  Procedure Laterality Date   breast tumor     IR CATHETER TUBE CHANGE  09/25/2021   IR CHOLANGIOGRAM EXISTING TUBE  05/13/2021   IR EXCHANGE BILIARY DRAIN  07/27/2021   IR EXCHANGE BILIARY DRAIN  11/04/2021   IR EXCHANGE BILIARY DRAIN  01/08/2022   IR EXCHANGE BILIARY DRAIN  05/31/2022   IR EXCHANGE BILIARY DRAIN  06/12/2022   IR RADIOLOGIST EVAL & MGMT  09/04/2021   IR REMOVAL OF CALCULI/DEBRIS BILIARY DUCT/GB  09/25/2021   IR REMOVAL OF CALCULI/DEBRIS BILIARY DUCT/GB  11/04/2021   IR REMOVAL OF CALCULI/DEBRIS BILIARY DUCT/GB  01/08/2022   IR THORACENTESIS ASP PLEURAL SPACE W/IMG GUIDE  05/28/2022   PACEMAKER IMPLANT     TONSILLECTOMY       Current Outpatient Medications  Medication Sig Dispense Refill   acetaminophen (TYLENOL) 325 MG tablet Take 2 tablets (650 mg total) by mouth every 6 (six) hours as needed for mild pain (or Fever >/= 101).     apixaban (ELIQUIS) 5 MG TABS tablet Take  1 tablet (5 mg total) by mouth 2 (two) times daily. 60 tablet 0   carvedilol (COREG) 3.125 MG tablet Take 1 tablet (3.125 mg total) by mouth 2 (two) times daily with a meal.     docusate sodium (COLACE) 100 MG capsule Take 1 capsule (100 mg total) by mouth 2 (two) times daily. 10 capsule 0   feeding supplement, GLUCERNA SHAKE, (GLUCERNA SHAKE) LIQD Take 237 mLs by mouth 3 (three)  times daily between meals.  0   furosemide (LASIX) 40 MG tablet Take 1 tablet (40 mg total) by mouth 2 (two) times daily. 180 tablet 3   hydrALAZINE (APRESOLINE) 50 MG tablet Take 1 tablet (50 mg total) by mouth every 8 (eight) hours.     hydrocortisone cream 1 % Apply topically 2 (two) times daily. 30 g 0   hydrOXYzine (ATARAX) 10 MG tablet Take 1 tablet (10 mg total) by mouth 3 (three) times daily as needed for itching. 30 tablet 0   insulin aspart (NOVOLOG) 100 UNIT/ML injection Inject 0-15 Units into the skin 3 (three) times daily with meals. Do NOT hold if patient is NPO. Moderate Scale. 10 mL 11   insulin aspart (NOVOLOG) 100 UNIT/ML injection Inject 0-5 Units into the skin at bedtime. Do NOT hold insulin if patient is NPO. 10 mL 11   insulin aspart (NOVOLOG) 100 UNIT/ML injection Inject 3 Units into the skin 3 (three) times daily with meals. 10 mL 11   insulin glargine-yfgn (SEMGLEE) 100 UNIT/ML injection Inject 0.15 mLs (15 Units total) into the skin daily. 10 mL 11   isosorbide mononitrate (IMDUR) 30 MG 24 hr tablet Take 0.5 tablets (15 mg total) by mouth daily.     midodrine (PROAMATINE) 2.5 MG tablet Take 1 tablet (2.5 mg total) by mouth 3 (three) times daily with meals.     Multiple Vitamin (MULTIVITAMIN WITH MINERALS) TABS tablet Take 1 tablet by mouth daily.     polyethylene glycol (MIRALAX / GLYCOLAX) 17 g packet Take 17 g by mouth daily as needed for mild constipation. 14 each 0   sodium chloride flush (NS) 0.9 % SOLN 5 mLs by Intracatheter route every 8 (eight) hours.     traZODone (DESYREL) 50 MG tablet Take 0.5 tablets (25 mg total) by mouth at bedtime as needed for sleep.     No current facility-administered medications for this visit.    Allergies:   Patient has no known allergies.    ROS:  Please see the history of present illness.   Otherwise, review of systems are positive for {NONE DEFAULTED:18576}.   All other systems are reviewed and negative.    PHYSICAL  EXAM: VS:  There were no vitals taken for this visit. , BMI There is no height or weight on file to calculate BMI. GENERAL:  Well appearing HEENT:  Pupils equal round and reactive, fundi not visualized, oral mucosa unremarkable NECK:  No jugular venous distention, waveform within normal limits, carotid upstroke brisk and symmetric, no bruits, no thyromegaly LYMPHATICS:  No cervical, inguinal adenopathy LUNGS:  Clear to auscultation bilaterally CHEST:  Well healed pacemaker pocket.  ***  Unremarkable HEART:  PMI not displaced or sustained,S1 and S2 within normal limits, no S3, no S4, no clicks, no rubs, *** murmurs ABD:  Flat, positive bowel sounds normal in frequency in pitch, no bruits, no rebound, no guarding, no midline pulsatile mass, no hepatomegaly, no splenomegaly EXT:  2 plus pulses throughout, no edema, no cyanosis no clubbing SKIN:  No  rashes no nodules NEURO:  Cranial nerves II through XII grossly intact, motor grossly intact throughout PSYCH:  Cognitively intact, oriented to person place and time    EKG:  EKG {ACTION; IS/IS TFT:[id number]} ordered today. The ekg ordered today demonstrates ***   Recent Labs: 05/04/2022: NT-Pro BNP 15,044 05/19/2022: B Natriuretic Peptide 2,083.9 05/21/2022: ALT 9 05/30/2022: Magnesium 2.7 06/15/2022: BUN 35; Creatinine, Ser 2.36; Hemoglobin 11.9; Platelets 424; Potassium 4.1; Sodium 140    Lipid Panel    Component Value Date/Time   CHOL 99 05/26/2022 0313   TRIG 54 05/26/2022 0313   HDL 36 (L) 05/26/2022 0313   CHOLHDL 2.8 05/26/2022 0313   VLDL 11 05/26/2022 0313   LDLCALC 52 05/26/2022 0313      Wt Readings from Last 3 Encounters:  06/15/22 167 lb 9.6 oz (76 kg)  06/12/22 174 lb 2.6 oz (79 kg)  06/09/22 176 lb (79.8 kg)      Other studies Reviewed: Additional studies/ records that were reviewed today include: ***. Review of the above records demonstrates:  Please see elsewhere in the note.  ***   ASSESSMENT AND PLAN:  ACUTE  SYSTOLIC AND DIASTOLIC HF ***  Lasix increased yesterday to 80 bid.    Net negative 2.2 liters.  In addition 1.3 removed with thoracentesis.  However, not robust urine out put.  I will give additional Lasix this PM.  Nephrology is following and the patient might need dialysis.     PPM:  I reviewed the most recent EP notes and she is up to date. ***    AKI:   ***  Creat continues to worsen.   See above.     ATRIAL FIB:   She is on Eliquis. ***  Continue.    Current medicines are reviewed at length with the patient today.  The patient {ACTIONS; HAS/DOES NOT HAVE:19233} concerns regarding medicines.  The following changes have been made:  {PLAN; NO CHANGE:13088:s}  Labs/ tests ordered today include: *** No orders of the defined types were placed in this encounter.    Disposition:   FU with ***    Signed, Minus Breeding, MD  06/27/2022 4:00 PM    [facility]

## 2022-06-28 ENCOUNTER — Ambulatory Visit: Payer: Medicare HMO | Admitting: Cardiology

## 2022-06-28 DIAGNOSIS — I5031 Acute diastolic (congestive) heart failure: Secondary | ICD-10-CM

## 2022-06-28 DIAGNOSIS — I480 Paroxysmal atrial fibrillation: Secondary | ICD-10-CM

## 2022-06-28 DIAGNOSIS — N179 Acute kidney failure, unspecified: Secondary | ICD-10-CM

## 2022-07-05 ENCOUNTER — Encounter: Payer: Self-pay | Admitting: Cardiology

## 2022-07-12 ENCOUNTER — Inpatient Hospital Stay: Payer: Medicare HMO | Admitting: Neurology

## 2022-07-12 ENCOUNTER — Encounter: Payer: Self-pay | Admitting: Neurology

## 2022-07-13 ENCOUNTER — Inpatient Hospital Stay (HOSPITAL_COMMUNITY)
Admission: EM | Admit: 2022-07-13 | Discharge: 2022-07-20 | DRG: 880 | Disposition: A | Discharge status: Skilled Nursing Facility | Payer: Medicare HMO | Attending: Family Medicine | Admitting: Family Medicine

## 2022-07-13 ENCOUNTER — Encounter (HOSPITAL_COMMUNITY): Payer: Self-pay | Admitting: *Deleted

## 2022-07-13 ENCOUNTER — Emergency Department (HOSPITAL_COMMUNITY): Payer: Medicare HMO

## 2022-07-13 ENCOUNTER — Other Ambulatory Visit: Payer: Self-pay

## 2022-07-13 DIAGNOSIS — E1165 Type 2 diabetes mellitus with hyperglycemia: Secondary | ICD-10-CM | POA: Diagnosis present

## 2022-07-13 DIAGNOSIS — N179 Acute kidney failure, unspecified: Secondary | ICD-10-CM | POA: Diagnosis present

## 2022-07-13 DIAGNOSIS — E11649 Type 2 diabetes mellitus with hypoglycemia without coma: Secondary | ICD-10-CM

## 2022-07-13 DIAGNOSIS — N1831 Chronic kidney disease, stage 3a: Secondary | ICD-10-CM | POA: Diagnosis present

## 2022-07-13 DIAGNOSIS — N184 Chronic kidney disease, stage 4 (severe): Secondary | ICD-10-CM | POA: Diagnosis present

## 2022-07-13 DIAGNOSIS — R748 Abnormal levels of other serum enzymes: Secondary | ICD-10-CM | POA: Diagnosis present

## 2022-07-13 DIAGNOSIS — D649 Anemia, unspecified: Secondary | ICD-10-CM | POA: Diagnosis present

## 2022-07-13 DIAGNOSIS — E1122 Type 2 diabetes mellitus with diabetic chronic kidney disease: Secondary | ICD-10-CM | POA: Diagnosis present

## 2022-07-13 DIAGNOSIS — I10 Essential (primary) hypertension: Secondary | ICD-10-CM | POA: Diagnosis present

## 2022-07-13 DIAGNOSIS — I130 Hypertensive heart and chronic kidney disease with heart failure and stage 1 through stage 4 chronic kidney disease, or unspecified chronic kidney disease: Secondary | ICD-10-CM | POA: Diagnosis present

## 2022-07-13 DIAGNOSIS — Z794 Long term (current) use of insulin: Secondary | ICD-10-CM

## 2022-07-13 DIAGNOSIS — Z8249 Family history of ischemic heart disease and other diseases of the circulatory system: Secondary | ICD-10-CM

## 2022-07-13 DIAGNOSIS — R569 Unspecified convulsions: Secondary | ICD-10-CM

## 2022-07-13 DIAGNOSIS — Z86718 Personal history of other venous thrombosis and embolism: Secondary | ICD-10-CM

## 2022-07-13 DIAGNOSIS — I639 Cerebral infarction, unspecified: Secondary | ICD-10-CM | POA: Diagnosis present

## 2022-07-13 DIAGNOSIS — Z950 Presence of cardiac pacemaker: Secondary | ICD-10-CM

## 2022-07-13 DIAGNOSIS — I69351 Hemiplegia and hemiparesis following cerebral infarction affecting right dominant side: Secondary | ICD-10-CM

## 2022-07-13 DIAGNOSIS — D631 Anemia in chronic kidney disease: Secondary | ICD-10-CM | POA: Diagnosis present

## 2022-07-13 DIAGNOSIS — Z79899 Other long term (current) drug therapy: Secondary | ICD-10-CM

## 2022-07-13 DIAGNOSIS — Z7901 Long term (current) use of anticoagulants: Secondary | ICD-10-CM

## 2022-07-13 DIAGNOSIS — R55 Syncope and collapse: Secondary | ICD-10-CM | POA: Diagnosis not present

## 2022-07-13 DIAGNOSIS — J189 Pneumonia, unspecified organism: Secondary | ICD-10-CM | POA: Diagnosis not present

## 2022-07-13 DIAGNOSIS — K811 Chronic cholecystitis: Secondary | ICD-10-CM | POA: Diagnosis present

## 2022-07-13 DIAGNOSIS — F445 Conversion disorder with seizures or convulsions: Principal | ICD-10-CM | POA: Diagnosis present

## 2022-07-13 DIAGNOSIS — Z833 Family history of diabetes mellitus: Secondary | ICD-10-CM

## 2022-07-13 DIAGNOSIS — E119 Type 2 diabetes mellitus without complications: Secondary | ICD-10-CM

## 2022-07-13 DIAGNOSIS — I5032 Chronic diastolic (congestive) heart failure: Secondary | ICD-10-CM | POA: Diagnosis present

## 2022-07-13 DIAGNOSIS — I480 Paroxysmal atrial fibrillation: Secondary | ICD-10-CM | POA: Diagnosis present

## 2022-07-13 LAB — URINALYSIS, ROUTINE W REFLEX MICROSCOPIC
Bilirubin Urine: NEGATIVE
Glucose, UA: 50 mg/dL — AB
Hgb urine dipstick: NEGATIVE
Ketones, ur: NEGATIVE mg/dL
Nitrite: NEGATIVE
Protein, ur: 100 mg/dL — AB
Specific Gravity, Urine: 1.01 (ref 1.005–1.030)
pH: 6 (ref 5.0–8.0)

## 2022-07-13 LAB — CBC WITH DIFFERENTIAL/PLATELET
Abs Immature Granulocytes: 0.04 10*3/uL (ref 0.00–0.07)
Basophils Absolute: 0.1 10*3/uL (ref 0.0–0.1)
Basophils Relative: 1 %
Eosinophils Absolute: 0.9 10*3/uL — ABNORMAL HIGH (ref 0.0–0.5)
Eosinophils Relative: 8 %
HCT: 36.8 % (ref 36.0–46.0)
Hemoglobin: 12.4 g/dL (ref 12.0–15.0)
Immature Granulocytes: 0 %
Lymphocytes Relative: 19 %
Lymphs Abs: 2.4 10*3/uL (ref 0.7–4.0)
MCH: 28.5 pg (ref 26.0–34.0)
MCHC: 33.7 g/dL (ref 30.0–36.0)
MCV: 84.6 fL (ref 80.0–100.0)
Monocytes Absolute: 0.8 10*3/uL (ref 0.1–1.0)
Monocytes Relative: 6 %
Neutro Abs: 8.2 10*3/uL — ABNORMAL HIGH (ref 1.7–7.7)
Neutrophils Relative %: 66 %
Platelets: 423 10*3/uL — ABNORMAL HIGH (ref 150–400)
RBC: 4.35 MIL/uL (ref 3.87–5.11)
RDW: 15.5 % (ref 11.5–15.5)
WBC: 12.5 10*3/uL — ABNORMAL HIGH (ref 4.0–10.5)
nRBC: 0 % (ref 0.0–0.2)

## 2022-07-13 LAB — COMPREHENSIVE METABOLIC PANEL
ALT: 16 U/L (ref 0–44)
AST: 21 U/L (ref 15–41)
Albumin: 3.4 g/dL — ABNORMAL LOW (ref 3.5–5.0)
Alkaline Phosphatase: 66 U/L (ref 38–126)
Anion gap: 14 (ref 5–15)
BUN: 89 mg/dL — ABNORMAL HIGH (ref 8–23)
CO2: 19 mmol/L — ABNORMAL LOW (ref 22–32)
Calcium: 8.8 mg/dL — ABNORMAL LOW (ref 8.9–10.3)
Chloride: 102 mmol/L (ref 98–111)
Creatinine, Ser: 3.01 mg/dL — ABNORMAL HIGH (ref 0.44–1.00)
GFR, Estimated: 16 mL/min — ABNORMAL LOW (ref >60)
Glucose, Bld: 160 mg/dL — ABNORMAL HIGH (ref 70–99)
Potassium: 4 mmol/L (ref 3.5–5.1)
Sodium: 135 mmol/L (ref 135–145)
Total Bilirubin: 0.7 mg/dL (ref 0.3–1.2)
Total Protein: 8.8 g/dL — ABNORMAL HIGH (ref 6.5–8.1)

## 2022-07-13 LAB — GLUCOSE, CAPILLARY
Glucose-Capillary: 131 mg/dL — ABNORMAL HIGH (ref 70–99)
Glucose-Capillary: 181 mg/dL — ABNORMAL HIGH (ref 70–99)
Glucose-Capillary: 225 mg/dL — ABNORMAL HIGH (ref 70–99)

## 2022-07-13 LAB — TSH: TSH: 1.35 u[IU]/mL (ref 0.350–4.500)

## 2022-07-13 LAB — TROPONIN I (HIGH SENSITIVITY)
Troponin I (High Sensitivity): 22 ng/L — ABNORMAL HIGH (ref <18)
Troponin I (High Sensitivity): 28 ng/L — ABNORMAL HIGH (ref <18)

## 2022-07-13 LAB — LIPASE, BLOOD: Lipase: 102 U/L — ABNORMAL HIGH (ref 11–51)

## 2022-07-13 MED ORDER — ONDANSETRON HCL 4 MG/2ML IJ SOLN
INTRAMUSCULAR | Status: AC
  Administered 2022-07-13: 4 mg via INTRAVENOUS
  Filled 2022-07-13: qty 2

## 2022-07-13 MED ORDER — DILTIAZEM HCL-DEXTROSE 125-5 MG/125ML-% IV SOLN (PREMIX)
5.0000 mg/h | INTRAVENOUS | Status: DC
  Administered 2022-07-13: 5 mg/h via INTRAVENOUS
  Filled 2022-07-13: qty 125

## 2022-07-13 MED ORDER — ISOSORBIDE MONONITRATE ER 30 MG PO TB24
15.0000 mg | ORAL_TABLET | Freq: Every day | ORAL | Status: DC
  Administered 2022-07-13 – 2022-07-20 (×7): 15 mg via ORAL
  Filled 2022-07-13 (×8): qty 1

## 2022-07-13 MED ORDER — SODIUM CHLORIDE 0.9 % IV BOLUS
500.0000 mL | Freq: Once | INTRAVENOUS | Status: AC
Start: 2022-07-13 — End: 2022-07-13
  Administered 2022-07-13: 500 mL via INTRAVENOUS

## 2022-07-13 MED ORDER — LORAZEPAM 2 MG/ML IJ SOLN
2.0000 mg | INTRAMUSCULAR | Status: DC | PRN
  Administered 2022-07-14: 2 mg via INTRAVENOUS
  Filled 2022-07-13: qty 1

## 2022-07-13 MED ORDER — LORAZEPAM 2 MG/ML IJ SOLN: 2.0000 mg | INTRAMUSCULAR | Status: DC

## 2022-07-13 MED ORDER — DILTIAZEM HCL 25 MG/5ML IV SOLN
INTRAVENOUS | Status: AC
  Administered 2022-07-13: 10 mg via INTRAVENOUS
  Filled 2022-07-13: qty 5

## 2022-07-13 MED ORDER — ACETAMINOPHEN 650 MG RE SUPP: 650.0000 mg | Freq: Four times a day (QID) | RECTAL | Status: DC | PRN

## 2022-07-13 MED ORDER — LACTATED RINGERS IV SOLN: INTRAVENOUS | Status: DC

## 2022-07-13 MED ORDER — INSULIN ASPART 100 UNIT/ML IJ SOLN
0.0000 [IU] | INTRAMUSCULAR | Status: DC
  Administered 2022-07-13: 3 [IU] via SUBCUTANEOUS
  Administered 2022-07-13: 5 [IU] via SUBCUTANEOUS
  Administered 2022-07-14: 2 [IU] via SUBCUTANEOUS
  Administered 2022-07-15 (×2): 3 [IU] via SUBCUTANEOUS
  Administered 2022-07-16: 5 [IU] via SUBCUTANEOUS
  Administered 2022-07-16 (×2): 3 [IU] via SUBCUTANEOUS
  Administered 2022-07-16: 2 [IU] via SUBCUTANEOUS
  Administered 2022-07-16 (×2): 3 [IU] via SUBCUTANEOUS
  Administered 2022-07-16 – 2022-07-17 (×2): 2 [IU] via SUBCUTANEOUS

## 2022-07-13 MED ORDER — CHLORHEXIDINE GLUCONATE CLOTH 2 % EX PADS
6.0000 | MEDICATED_PAD | Freq: Every day | CUTANEOUS | Status: DC
  Administered 2022-07-14 – 2022-07-17 (×4): 6 via TOPICAL

## 2022-07-13 MED ORDER — HYDRALAZINE HCL 20 MG/ML IJ SOLN
10.0000 mg | Freq: Once | INTRAMUSCULAR | Status: AC
Start: 2022-07-13 — End: 2022-07-13
  Administered 2022-07-13: 10 mg via INTRAVENOUS
  Filled 2022-07-13: qty 1

## 2022-07-13 MED ORDER — LEVETIRACETAM IN NACL 1500 MG/100ML IV SOLN
1500.0000 mg | Freq: Once | INTRAVENOUS | Status: AC
  Administered 2022-07-13: 1500 mg via INTRAVENOUS
  Filled 2022-07-13: qty 100

## 2022-07-13 MED ORDER — LEVETIRACETAM IN NACL 500 MG/100ML IV SOLN
500.0000 mg | INTRAVENOUS | Status: DC
  Administered 2022-07-14: 500 mg via INTRAVENOUS
  Filled 2022-07-13: qty 100

## 2022-07-13 MED ORDER — HYDROMORPHONE HCL 1 MG/ML IJ SOLN: 0.5000 mg | INTRAMUSCULAR | Status: DC | PRN

## 2022-07-13 MED ORDER — ONDANSETRON HCL 4 MG/2ML IJ SOLN
4.0000 mg | Freq: Four times a day (QID) | INTRAMUSCULAR | Status: DC | PRN
  Administered 2022-07-17 – 2022-07-18 (×3): 4 mg via INTRAVENOUS
  Filled 2022-07-13 (×3): qty 2

## 2022-07-13 MED ORDER — CARVEDILOL 3.125 MG PO TABS
3.1250 mg | ORAL_TABLET | Freq: Two times a day (BID) | ORAL | Status: DC
  Administered 2022-07-13 – 2022-07-20 (×12): 3.125 mg via ORAL
  Filled 2022-07-13 (×14): qty 1

## 2022-07-13 MED ORDER — LORAZEPAM 2 MG/ML IJ SOLN
1.0000 mg | Freq: Once | INTRAMUSCULAR | Status: AC
  Administered 2022-07-13: 1 mg via INTRAVENOUS
  Filled 2022-07-13: qty 1

## 2022-07-13 MED ORDER — ACETAMINOPHEN 325 MG PO TABS
650.0000 mg | ORAL_TABLET | Freq: Four times a day (QID) | ORAL | Status: DC | PRN
  Administered 2022-07-17 – 2022-07-18 (×3): 650 mg via ORAL
  Filled 2022-07-13 (×3): qty 2

## 2022-07-13 MED ORDER — DILTIAZEM HCL 25 MG/5ML IV SOLN
10.0000 mg | Freq: Once | INTRAVENOUS | Status: AC
Start: 2022-07-13 — End: 2022-07-13

## 2022-07-13 MED ORDER — HYDRALAZINE HCL 50 MG PO TABS
50.0000 mg | ORAL_TABLET | Freq: Three times a day (TID) | ORAL | Status: DC
  Administered 2022-07-15 – 2022-07-20 (×17): 50 mg via ORAL
  Filled 2022-07-13 (×10): qty 1
  Filled 2022-07-13: qty 2
  Filled 2022-07-13 (×7): qty 1

## 2022-07-13 NOTE — Progress Notes (Signed)
Brief neurology note  Neurology received urgent teleconsult for this patient for possible seizure activity. She was alert and oriented throughout the examination except for very brief periods of time, typically induced by asking her to lift an extremity, where she had irregular writhing jerking movements and crying out (not a guttural exclamation typical of seizures). Based on semiology witnessed on video there is concern for nonepileptic spells vs brief epileptic seizures. I gave her '1mg'$  ativan and keppra load f/b '500mg'$  keppra q 24 hrs (renal dosing). I have asked Dr. Manuella Ghazi to put in a transfer request to [facility] for cEEG. I would not be overly aggressive in treating these spells unless vital signs become unstable with them. Head CT in ED NAICP, personal review. Seizure precautions. Full consult note to follow.  Su Monks, MD [facility] [phone]  If 7pm- 7am, please page neurology on call as listed in [place].

## 2022-07-13 NOTE — ED Triage Notes (Signed)
Pt c/o right shoulder pain due to the fall this am

## 2022-07-13 NOTE — ED Notes (Addendum)
Dr. Manuella Ghazi made aware of syncopal episode and possible seizure.  No new orders and verbalized understanding.  Pt arousable to voice and stated that she passed out again.

## 2022-07-13 NOTE — H&P (Addendum)
History and Physical    Kerly D Gassmann QPR:[id number] DOB: 11-14-1951 DOA: 07/13/2022  PCP: Coolidge Breeze, FNP   Patient coming from: Home  Chief Complaint: Loss of consciousness  HPI: Tracey Bryant is a 71 y.o. female with medical history significant for type 2 diabetes, hypertension, multiple CVAs with residual right-sided weakness, paroxysmal atrial fibrillation and history of DVT on Eliquis, CKD stage IV, and diastolic CHF who presented to the ED on account of a syncopal episode today.  Patient states that she knew she was about to pass out as she was trying to move towards her sofa and the next thing she recalls is finding herself on the floor.  Her phone was within reach so she was able to call EMS.  She lives alone, but her daughter lives next door to her.  She is normally ambulatory with walker at baseline, but has chronic right-sided weakness due to prior CVA.  She does claim to have had worsening generalized weakness over the past week with multiple episodes of near syncope and some chronic nausea.  She denies abdominal pain, fevers, chills, or diarrhea.  She does claim to be eating and drinking well otherwise.   ED Course: Vital signs stable and patient is afebrile.  Leukocytosis of 12,500 noted.  BUN 89, creatinine 3.01.  CT head with no acute findings.  She was going to be admitted upstairs for syncope evaluation at which point in time she began having shaking spells resembling a possible seizure.  She was then transferred to stepdown unit.  Urgent tele-neurology consultation performed with recommendations to load with Keppra for now and obtain routine EEG.  She will require transfer to [facility] for continuous EEG monitoring when bed available.  She does not appear to have any actual seizure activity as witnessed by neurology.  Review of Systems: Reviewed as noted above, otherwise negative.  Past Medical History:  Diagnosis Date   Arthritis    Chronic systolic (congestive)  heart failure (HCC)    Diabetes mellitus without complication ([place])    DVT (deep venous thrombosis) ([place]) 07/26/2021   Hypertension    Paroxysmal atrial fibrillation ([place]) 07/26/2021   Stroke [facility])     Past Surgical History:  Procedure Laterality Date   breast tumor     IR CATHETER TUBE CHANGE  09/25/2021   IR CHOLANGIOGRAM EXISTING TUBE  05/13/2021   IR EXCHANGE BILIARY DRAIN  07/27/2021   IR EXCHANGE BILIARY DRAIN  11/04/2021   IR EXCHANGE BILIARY DRAIN  01/08/2022   IR EXCHANGE BILIARY DRAIN  05/31/2022   IR EXCHANGE BILIARY DRAIN  06/12/2022   IR RADIOLOGIST EVAL & MGMT  09/04/2021   IR REMOVAL OF CALCULI/DEBRIS BILIARY DUCT/GB  09/25/2021   IR REMOVAL OF CALCULI/DEBRIS BILIARY DUCT/GB  11/04/2021   IR REMOVAL OF CALCULI/DEBRIS BILIARY DUCT/GB  01/08/2022   IR THORACENTESIS ASP PLEURAL SPACE W/IMG GUIDE  05/28/2022   PACEMAKER IMPLANT     TONSILLECTOMY       reports that she has never smoked. She has never used smokeless tobacco. She reports that she does not drink alcohol and does not use drugs.  No Known Allergies  Family History  Problem Relation Age of Onset   Cancer Mother    Diabetes Mother    Heart disease Father     Prior to Admission medications   Medication Sig Start Date End Date Taking? Authorizing Provider  acetaminophen (TYLENOL) 325 MG tablet Take 2 tablets (650 mg total) by mouth every 6 (  six) hours as needed for mild pain (or Fever >/= 101). 06/09/22   Samella Parr, NP  apixaban (ELIQUIS) 5 MG TABS tablet Take 1 tablet (5 mg total) by mouth 2 (two) times daily. 08/04/21   Pattricia Boss, MD  carvedilol (COREG) 3.125 MG tablet Take 1 tablet (3.125 mg total) by mouth 2 (two) times daily with a meal. 06/09/22   Samella Parr, NP  docusate sodium (COLACE) 100 MG capsule Take 1 capsule (100 mg total) by mouth 2 (two) times daily. 06/09/22   Samella Parr, NP  feeding supplement, GLUCERNA SHAKE, (GLUCERNA SHAKE) LIQD Take 237 mLs by mouth 3 (three) times daily between  meals. 06/09/22   Samella Parr, NP  furosemide (LASIX) 40 MG tablet Take 1 tablet (40 mg total) by mouth 2 (two) times daily. 05/04/22   Bhagat, Crista Luria, PA  hydrALAZINE (APRESOLINE) 50 MG tablet Take 1 tablet (50 mg total) by mouth every 8 (eight) hours. 06/09/22   Samella Parr, NP  hydrocortisone cream 1 % Apply topically 2 (two) times daily. 06/09/22   Samella Parr, NP  hydrOXYzine (ATARAX) 10 MG tablet Take 1 tablet (10 mg total) by mouth 3 (three) times daily as needed for itching. 06/09/22   Samella Parr, NP  insulin aspart (NOVOLOG) 100 UNIT/ML injection Inject 0-15 Units into the skin 3 (three) times daily with meals. Do NOT hold if patient is NPO. Moderate Scale. 06/09/22   Samella Parr, NP  insulin aspart (NOVOLOG) 100 UNIT/ML injection Inject 0-5 Units into the skin at bedtime. Do NOT hold insulin if patient is NPO. 06/09/22   Samella Parr, NP  insulin aspart (NOVOLOG) 100 UNIT/ML injection Inject 3 Units into the skin 3 (three) times daily with meals. 06/09/22   Samella Parr, NP  insulin glargine-yfgn (SEMGLEE) 100 UNIT/ML injection Inject 0.15 mLs (15 Units total) into the skin daily. 06/10/22   Samella Parr, NP  isosorbide mononitrate (IMDUR) 30 MG 24 hr tablet Take 0.5 tablets (15 mg total) by mouth daily. 06/10/22   Samella Parr, NP  midodrine (PROAMATINE) 2.5 MG tablet Take 1 tablet (2.5 mg total) by mouth 3 (three) times daily with meals. 06/09/22   Samella Parr, NP  Multiple Vitamin (MULTIVITAMIN WITH MINERALS) TABS tablet Take 1 tablet by mouth daily. 06/10/22   Samella Parr, NP  polyethylene glycol (MIRALAX / GLYCOLAX) 17 g packet Take 17 g by mouth daily as needed for mild constipation. 06/09/22   Samella Parr, NP  sodium chloride flush (NS) 0.9 % SOLN 5 mLs by Intracatheter route every 8 (eight) hours. 06/09/22   Samella Parr, NP  traZODone (DESYREL) 50 MG tablet Take 0.5 tablets (25 mg total) by mouth at bedtime as needed for sleep.  06/09/22   Samella Parr, NP    Physical Exam: Vitals:   07/13/22 1421 07/13/22 1443 07/13/22 1505 07/13/22 1632  BP:  (!) 201/130 (!) 168/86   Pulse: 74 (!) 115 (!) 48   Resp: 14  14   Temp:    97.7 F (36.5 C)  TempSrc:    Axillary  SpO2: 100% 100% 99%   Weight:      Height:        Constitutional: NAD, calm, comfortable Vitals:   07/13/22 1421 07/13/22 1443 07/13/22 1505 07/13/22 1632  BP:  (!) 201/130 (!) 168/86   Pulse: 74 (!) 115 (!) 48   Resp: 14  14   Temp:  97.7 F (36.5 C)  TempSrc:    Axillary  SpO2: 100% 100% 99%   Weight:      Height:       Eyes: lids and conjunctivae normal Neck: normal, supple Respiratory: clear to auscultation bilaterally. Normal respiratory effort. No accessory muscle use.  Cardiovascular: Regular rate and rhythm, no murmurs. Abdomen: no tenderness, no distention. Bowel sounds positive.  Musculoskeletal:  No edema. Skin: no rashes, lesions, ulcers.  Psychiatric: Flat affect  Labs on Admission: I have personally reviewed following labs and imaging studies  CBC: Recent Labs  Lab 07/13/22 1116  WBC 12.5*  NEUTROABS 8.2*  HGB 12.4  HCT 36.8  MCV 84.6  PLT 778*   Basic Metabolic Panel: Recent Labs  Lab 07/13/22 1116  NA 135  K 4.0  CL 102  CO2 19*  GLUCOSE 160*  BUN 89*  CREATININE 3.01*  CALCIUM 8.8*   GFR: Estimated Creatinine Clearance: 16.3 mL/min (A) (by C-G formula based on SCr of 3.01 mg/dL (H)). Liver Function Tests: Recent Labs  Lab 07/13/22 1116  AST 21  ALT 16  ALKPHOS 66  BILITOT 0.7  PROT 8.8*  ALBUMIN 3.4*   Recent Labs  Lab 07/13/22 1116  LIPASE 102*   No results for input(s): "AMMONIA" in the last 168 hours. Coagulation Profile: No results for input(s): "INR", "PROTIME" in the last 168 hours. Cardiac Enzymes: No results for input(s): "CKTOTAL", "CKMB", "CKMBINDEX", "TROPONINI" in the last 168 hours. BNP (last 3 results) Recent Labs    05/04/22 1625  PROBNP 15,044*   HbA1C: No  results for input(s): "HGBA1C" in the last 72 hours. CBG: Recent Labs  Lab 07/13/22 1455  GLUCAP 181*   Lipid Profile: No results for input(s): "CHOL", "HDL", "LDLCALC", "TRIG", "CHOLHDL", "LDLDIRECT" in the last 72 hours. Thyroid Function Tests: No results for input(s): "TSH", "T4TOTAL", "FREET4", "T3FREE", "THYROIDAB" in the last 72 hours. Anemia Panel: No results for input(s): "VITAMINB12", "FOLATE", "FERRITIN", "TIBC", "IRON", "RETICCTPCT" in the last 72 hours. Urine analysis:    Component Value Date/Time   COLORURINE YELLOW 07/13/2022 0955   APPEARANCEUR HAZY (A) 07/13/2022 0955   LABSPEC 1.010 07/13/2022 0955   PHURINE 6.0 07/13/2022 0955   GLUCOSEU 50 (A) 07/13/2022 0955   HGBUR NEGATIVE 07/13/2022 0955   BILIRUBINUR NEGATIVE 07/13/2022 0955   KETONESUR NEGATIVE 07/13/2022 0955   PROTEINUR 100 (A) 07/13/2022 0955   UROBILINOGEN 0.2 01/17/2013 1910   NITRITE NEGATIVE 07/13/2022 0955   LEUKOCYTESUR SMALL (A) 07/13/2022 0955    Radiological Exams on Admission: DG Hip Unilat W or Wo Pelvis 2-3 Views Right  Result Date: 07/13/2022 CLINICAL DATA:  Trauma, fall EXAM: DG HIP (WITH OR WITHOUT PELVIS) 2-3V RIGHT COMPARISON:  None Available. FINDINGS: No fracture or dislocation is seen. Arterial calcifications are seen in soft tissues. IMPRESSION: No fracture or dislocation is seen in right hip. Electronically Signed   By: Elmer Picker M.D.   On: 07/13/2022 10:44   DG Shoulder Right  Result Date: 07/13/2022 CLINICAL DATA:  Trauma, fall, pain EXAM: RIGHT SHOULDER - 2+ VIEW COMPARISON:  None Available. FINDINGS: No recent fracture or dislocation is seen in right shoulder. Osteopenia is seen in bony structures. Small smoothly marginated calcification adjacent to the [facility] joint may be due to degenerative arthritis. 2 mm faint calcification adjacent to the lateral margin of right acromion may be residual from previous injury. IMPRESSION: No recent fracture or dislocation is seen in  right shoulder. Electronically Signed   By: Prudy Feeler.D.  On: 07/13/2022 10:43   DG Chest 1 View  Result Date: 07/13/2022 CLINICAL DATA:  Syncope EXAM: CHEST  1 VIEW COMPARISON:  05/28/2022 FINDINGS: Transverse diameter of heart is slightly increased. There are no signs of pulmonary edema or focal pulmonary consolidation. There is interval decrease in pulmonary vascular congestion. There is no pleural effusion or pneumothorax. Pacemaker battery is seen in the left infraclavicular region. IMPRESSION: No active disease is seen in the chest. Electronically Signed   By: Elmer Picker M.D.   On: 07/13/2022 10:42   CT Head Wo Contrast  Result Date: 07/13/2022 CLINICAL DATA:  Syncope, fall, dizziness EXAM: CT HEAD WITHOUT CONTRAST TECHNIQUE: Contiguous axial images were obtained from the base of the skull through the vertex without intravenous contrast. RADIATION DOSE REDUCTION: This exam was performed according to the departmental dose-optimization program which includes automated exposure control, adjustment of the mA and/or kV according to patient size and/or use of iterative reconstruction technique. COMPARISON:  Previous CT done on 04/05/2021, MR brain done on 05/25/2022 FINDINGS: Brain: No acute intracranial findings are seen. There are no signs of bleeding within the cranium. There are areas of encephalomalacia in the right frontal, parietal and occipital lobes consistent with old infarcts. There is no focal edema or mass effect. Ventricles are not dilated. Cortical sulci are prominent. Vascular: There are scattered arterial calcifications. Skull: No fracture is seen in the calvarium. Sinuses/Orbits: Unremarkable. Other: None. IMPRESSION: No acute intracranial findings are seen in noncontrast CT brain. There are multiple old infarcts in right cerebellar hemisphere. Atrophy. Electronically Signed   By: Elmer Picker M.D.   On: 07/13/2022 10:28    EKG: Independently reviewed. SR  61bpm.  Assessment/Plan Principal Problem:   Syncope Active Problems:   Acute renal failure superimposed on stage 4 chronic kidney disease (HCC)   Type II diabetes mellitus (HCC)   Hypertension   Chronic anticoagulation   CVA (cerebral vascular accident) ([place])   Normocytic anemia    Syncopal episode with associated fall at home -Associated with shaking spells that appear to not be related to seizures according to neurology, however providing coverage with Ativan and Keppra load for now -Ativan as needed for seizure, seizure precautions -Routine EEG -Will require transfer to [facility] once bed available for continuous EEG monitoring, nonurgent -Appreciate further neurology recommendations with reevaluation -Obtain 2D echocardiogram -Carotid ultrasound  Prior history of CVAs -No acute findings of CVA on CT head  AKI on CKD stage IV -Baseline creatinine approximately 2.6-3 -Likely due to poor oral intake with chronic nausea -Continue gentle IV fluid while n.p.o. -Strict I's and O's -Repeat a.m. labs  Elevated lipase -EDP discussed case with GI -Does not appear to have pancreatitis at this time -Keep on IV fluid and n.p.o. for now given above findings -Monitor repeat labs  Type 2 diabetes with mild hyperglycemia -Hold home insulin -Maintain on SSI every 4 hours while here  Hypertension -Appears to be on midodrine as well as antihypertensives -Blood pressure currently elevated -Continue home medications with the exception of Lasix  Paroxysmal atrial fibrillation with RVR -Continue Eliquis for anticoagulation -IV Cardizem bolus given and started drip -Continue Coreg  History of chronic cholelithiasis/cholecystitis -Patient does have chronic biliary drain in place with tube last changed 06/12/2022  History of prior DVT -Continue Eliquis  Diastolic CHF -Currently appears slightly dry -Continue IV fluid and hold diuretics -Daily weights -Prior 2D echocardiogram  5/23 with LVEF 50-55% and grade 2 diastolic dysfunction  DVT prophylaxis: Eliquis Code Status: Full  Family Communication: None at bedside Disposition Plan:Admit for evaluation of syncope/seizure Consults called:Neurology Admission status: Obs, Tele->SDU  Severity of Illness: The appropriate patient status for this patient is OBSERVATION. Observation status is judged to be reasonable and necessary in order to provide the required intensity of service to ensure the patient's safety. The patient's presenting symptoms, physical exam findings, and initial radiographic and laboratory data in the context of their medical condition is felt to place them at decreased risk for further clinical deterioration. Furthermore, it is anticipated that the patient will be medically stable for discharge from the hospital within 2 midnights of admission.    Tayte Childers D Manuella Ghazi DO [facility]  If 7PM-7AM, please contact night-coverage [email or web address]  07/13/2022, 4:51 PM

## 2022-07-13 NOTE — ED Triage Notes (Signed)
Pt brought in by ems for c/o syncopal episode; pt states she remembers the fall but woke up on the floor  Pt c/o weakness x 5 days  202/97 BP with ems Cbg 196  Pt has bile drain with multiple gallstones

## 2022-07-13 NOTE — ED Notes (Signed)
Med surg nurse went to take pt upstairs and get her up, pt passed out. Reported that with shaking movements.

## 2022-07-13 NOTE — Progress Notes (Signed)
This nurse and Caleb Hanks, NT went to transport pt to [facility]300 via w/c and pt was sit upright to transport and pt had jerking movements falling backwards on bed and became lethargic. ED nurse and MD in to assess. Pt brought to 300 via stretcher, MD aware

## 2022-07-13 NOTE — ED Provider Notes (Signed)
[facility] EMERGENCY DEPARTMENT Provider Note   CSN: [id number] Arrival date & time: 07/13/22  4098     History  Chief Complaint  Patient presents with   Loss of Consciousness    Tracey Bryant is a 71 y.o. female with history significant for type 2 diabetes, hypertension, CHF, multiple CVAs with residual right-sided weakness, paroxysmal atrial fibrillation and history of DVT on Eliquis presenting for evaluation of slowly worsening generalized fatigue and weakness over the past week.  She reports multiple episodes of near syncope this week with exertion, today actually did syncopize, stating she knew she was about to pass out and tried to get to her sofa, but passed out about a foot before reaching it.  She reports landing on her right side as she endorses pain in her right shoulder and hip.  Her phone was within reach so she was able to call EMS.  She lives alone but her daughter lives next door to her.  She normally is ambulatory at baseline but has chronic right-sided weakness which is not worse today.  She denies headache, denies chest pain, palpitations worsened shortness of breath although on presentation she does appear fairly short of breath unable to speak in full sentences.  She denies any other complaints of pain and states she has been eating and drinking well.  Doubts dehydration.  She has been urinating a lot but also takes Lasix.  Denies dysuria, diarrhea.  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  She denies abdominal pain, but has had persistent nausea, no emesis.    The history is provided by the patient.       Home Medications Prior to Admission medications   Medication Sig Start Date End Date Taking? Authorizing Provider  acetaminophen (TYLENOL) 325 MG tablet Take 2 tablets (650 mg total) by mouth every 6 (six) hours as needed for mild pain (or Fever >/= 101). 06/09/22   Samella Parr, NP   apixaban (ELIQUIS) 5 MG TABS tablet Take 1 tablet (5 mg total) by mouth 2 (two) times daily. 08/04/21   Pattricia Boss, MD  carvedilol (COREG) 3.125 MG tablet Take 1 tablet (3.125 mg total) by mouth 2 (two) times daily with a meal. 06/09/22   Samella Parr, NP  docusate sodium (COLACE) 100 MG capsule Take 1 capsule (100 mg total) by mouth 2 (two) times daily. 06/09/22   Samella Parr, NP  feeding supplement, GLUCERNA SHAKE, (GLUCERNA SHAKE) LIQD Take 237 mLs by mouth 3 (three) times daily between meals. 06/09/22   Samella Parr, NP  furosemide (LASIX) 40 MG tablet Take 1 tablet (40 mg total) by mouth 2 (two) times daily. 05/04/22   Bhagat, Crista Luria, PA  hydrALAZINE (APRESOLINE) 50 MG tablet Take 1 tablet (50 mg total) by mouth every 8 (eight) hours. 06/09/22   Samella Parr, NP  hydrocortisone cream 1 % Apply topically 2 (two) times daily. 06/09/22   Samella Parr, NP  hydrOXYzine (ATARAX) 10 MG tablet Take 1 tablet (10 mg total) by mouth 3 (three) times daily as needed for itching. 06/09/22   Samella Parr, NP  insulin aspart (NOVOLOG) 100 UNIT/ML injection Inject 0-15 Units into the skin 3 (three) times daily with meals. Do NOT hold if patient is NPO. Moderate Scale. 06/09/22   Samella Parr, NP  insulin aspart (NOVOLOG) 100 UNIT/ML injection Inject 0-5 Units into the skin at bedtime.  Do NOT hold insulin if patient is NPO. 06/09/22   Samella Parr, NP  insulin aspart (NOVOLOG) 100 UNIT/ML injection Inject 3 Units into the skin 3 (three) times daily with meals. 06/09/22   Samella Parr, NP  insulin glargine-yfgn (SEMGLEE) 100 UNIT/ML injection Inject 0.15 mLs (15 Units total) into the skin daily. 06/10/22   Samella Parr, NP  isosorbide mononitrate (IMDUR) 30 MG 24 hr tablet Take 0.5 tablets (15 mg total) by mouth daily. 06/10/22   Samella Parr, NP  midodrine (PROAMATINE) 2.5 MG tablet Take 1 tablet (2.5 mg total) by mouth 3 (three) times daily with meals. 06/09/22   Samella Parr, NP  Multiple Vitamin (MULTIVITAMIN WITH MINERALS) TABS tablet Take 1 tablet by mouth daily. 06/10/22   Samella Parr, NP  polyethylene glycol (MIRALAX / GLYCOLAX) 17 g packet Take 17 g by mouth daily as needed for mild constipation. 06/09/22   Samella Parr, NP  sodium chloride flush (NS) 0.9 % SOLN 5 mLs by Intracatheter route every 8 (eight) hours. 06/09/22   Samella Parr, NP  traZODone (DESYREL) 50 MG tablet Take 0.5 tablets (25 mg total) by mouth at bedtime as needed for sleep. 06/09/22   Samella Parr, NP      Allergies    Patient has no known allergies.    Review of Systems   Review of Systems  Constitutional:  Negative for chills and fever.  HENT:  Negative for congestion and sore throat.   Eyes: Negative.   Respiratory:  Positive for shortness of breath. Negative for chest tightness.   Cardiovascular:  Negative for chest pain, palpitations and leg swelling.  Gastrointestinal:  Positive for nausea. Negative for abdominal pain, diarrhea and vomiting.  Genitourinary: Negative.   Musculoskeletal:  Positive for arthralgias. Negative for joint swelling and neck pain.  Skin: Negative.  Negative for rash and wound.  Neurological:  Positive for syncope and weakness. Negative for dizziness, light-headedness, numbness and headaches.  Psychiatric/Behavioral: Negative.      Physical Exam Updated Vital Signs BP (!) 178/76   Pulse 60   Temp 98.1 F (36.7 C) (Oral)   Resp 11   Ht '5\' 6"'$  (1.676 m)   Wt 68 kg   SpO2 100%   BMI 24.21 kg/m  Physical Exam Vitals and nursing note reviewed.  Constitutional:      Appearance: She is well-developed.  HENT:     Head: Normocephalic and atraumatic.  Eyes:     Conjunctiva/sclera: Conjunctivae normal.  Cardiovascular:     Rate and Rhythm: Normal rate and regular rhythm.     Heart sounds: Normal heart sounds.  Pulmonary:     Effort: Pulmonary effort is normal.     Breath sounds: Normal breath sounds. No wheezing.   Abdominal:     General: Bowel sounds are normal.     Palpations: Abdomen is soft.     Tenderness: There is no abdominal tenderness. There is no guarding or rebound.     Comments: Biliary drain appears to be draining well with bile noted.   Musculoskeletal:        General: Normal range of motion.     Cervical back: Normal range of motion.  Skin:    General: Skin is warm and dry.  Neurological:     Mental Status: She is alert.     ED Results / Procedures / Treatments   Labs (all labs ordered are listed, but only abnormal results are displayed)  Labs Reviewed  URINALYSIS, ROUTINE W REFLEX MICROSCOPIC - Abnormal; Notable for the following components:      Result Value   APPearance HAZY (*)    Glucose, UA 50 (*)    Protein, ur 100 (*)    Leukocytes,Ua SMALL (*)    Bacteria, UA RARE (*)    All other components within normal limits  CBC WITH DIFFERENTIAL/PLATELET - Abnormal; Notable for the following components:   WBC 12.5 (*)    Platelets 423 (*)    Neutro Abs 8.2 (*)    Eosinophils Absolute 0.9 (*)    All other components within normal limits  COMPREHENSIVE METABOLIC PANEL - Abnormal; Notable for the following components:   CO2 19 (*)    Glucose, Bld 160 (*)    BUN 89 (*)    Creatinine, Ser 3.01 (*)    Calcium 8.8 (*)    Total Protein 8.8 (*)    Albumin 3.4 (*)    GFR, Estimated 16 (*)    All other components within normal limits  LIPASE, BLOOD - Abnormal; Notable for the following components:   Lipase 102 (*)    All other components within normal limits  TROPONIN I (HIGH SENSITIVITY) - Abnormal; Notable for the following components:   Troponin I (High Sensitivity) 22 (*)    All other components within normal limits  TROPONIN I (HIGH SENSITIVITY)    EKG None  Radiology DG Hip Unilat W or Wo Pelvis 2-3 Views Right  Result Date: 07/13/2022 CLINICAL DATA:  Trauma, fall EXAM: DG HIP (WITH OR WITHOUT PELVIS) 2-3V RIGHT COMPARISON:  None Available. FINDINGS: No  fracture or dislocation is seen. Arterial calcifications are seen in soft tissues. IMPRESSION: No fracture or dislocation is seen in right hip. Electronically Signed   By: Elmer Picker M.D.   On: 07/13/2022 10:44   DG Shoulder Right  Result Date: 07/13/2022 CLINICAL DATA:  Trauma, fall, pain EXAM: RIGHT SHOULDER - 2+ VIEW COMPARISON:  None Available. FINDINGS: No recent fracture or dislocation is seen in right shoulder. Osteopenia is seen in bony structures. Small smoothly marginated calcification adjacent to the [facility] joint may be due to degenerative arthritis. 2 mm faint calcification adjacent to the lateral margin of right acromion may be residual from previous injury. IMPRESSION: No recent fracture or dislocation is seen in right shoulder. Electronically Signed   By: Elmer Picker M.D.   On: 07/13/2022 10:43   DG Chest 1 View  Result Date: 07/13/2022 CLINICAL DATA:  Syncope EXAM: CHEST  1 VIEW COMPARISON:  05/28/2022 FINDINGS: Transverse diameter of heart is slightly increased. There are no signs of pulmonary edema or focal pulmonary consolidation. There is interval decrease in pulmonary vascular congestion. There is no pleural effusion or pneumothorax. Pacemaker battery is seen in the left infraclavicular region. IMPRESSION: No active disease is seen in the chest. Electronically Signed   By: Elmer Picker M.D.   On: 07/13/2022 10:42   CT Head Wo Contrast  Result Date: 07/13/2022 CLINICAL DATA:  Syncope, fall, dizziness EXAM: CT HEAD WITHOUT CONTRAST TECHNIQUE: Contiguous axial images were obtained from the base of the skull through the vertex without intravenous contrast. RADIATION DOSE REDUCTION: This exam was performed according to the departmental dose-optimization program which includes automated exposure control, adjustment of the mA and/or kV according to patient size and/or use of iterative reconstruction technique. COMPARISON:  Previous CT done on 04/05/2021, MR brain done on  05/25/2022 FINDINGS: Brain: No acute intracranial findings are seen. There are no  signs of bleeding within the cranium. There are areas of encephalomalacia in the right frontal, parietal and occipital lobes consistent with old infarcts. There is no focal edema or mass effect. Ventricles are not dilated. Cortical sulci are prominent. Vascular: There are scattered arterial calcifications. Skull: No fracture is seen in the calvarium. Sinuses/Orbits: Unremarkable. Other: None. IMPRESSION: No acute intracranial findings are seen in noncontrast CT brain. There are multiple old infarcts in right cerebellar hemisphere. Atrophy. Electronically Signed   By: Elmer Picker M.D.   On: 07/13/2022 10:28    Procedures Procedures    Medications Ordered in ED Medications  sodium chloride 0.9 % bolus 500 mL (0 mLs Intravenous Stopped 07/13/22 1317)  hydrALAZINE (APRESOLINE) injection 10 mg (10 mg Intravenous Given 07/13/22 1316)    ED Course/ Medical Decision Making/ A&P                           Medical Decision Making Patient presenting with an episode of syncope along with generalized worsening weakness times the past week.  She does appear to have some degree of dehydration given a significant elevation in her creatinine and her BUN.  However her blood pressure remains elevated here, she has not had any of her morning medications including her blood pressure medicines.  Her pulse rate is borderline bradycardic, will hold her Coreg at this time, but was given an IV dose of her her hydralazine.  She was also given a gentle normal saline fluid bolus of 500 cc given her significant problems with CHF.  Patient will require admission given her syncope and AKI.  Amount and/or Complexity of Data Reviewed Labs: ordered.    Details: Labs reviewed, acute kidney injury with elevated BUN and creatinine. Radiology: ordered. Discussion of management or test interpretation with external provider(s): Discussed patient's  elevated lipase with Dr. Jenetta Downer of GI, symptom of nausea only, no abdominal pain, no vomiting.  He suggested no further intervention at this time unless she develops abdominal pain or other concerns suggesting acute pancreatitis at which time CT imaging may be warranted.  Call placed to the hospitalist service for admission.  Discussed with Dr. Manuella Ghazi who accepts pt for admission.  Risk Prescription drug management.           Final Clinical Impression(s) / ED Diagnoses Final diagnoses:  Syncope, unspecified syncope type  AKI (acute kidney injury) [facility])    Rx / DC Orders ED Discharge Orders     None         Landis Martins 07/13/22 1328    Milton Ferguson, MD 07/15/22 1126

## 2022-07-13 NOTE — Consult Note (Addendum)
NEUROLOGY TELECONSULTATION NOTE   Date of service: July 13, 2022 Patient Name: Tracey Bryant MRN:  [id number] DOB:  January 20, 1951 Reason for consult: possible seizure activity  Requesting Provider: Dr. Heath Lark Consult Participants: myself, patient, bedside RN Location of the provider: [facility] Location of the patient: [facility]  This consult was provided via telemedicine with 2-way video and audio communication. The patient/family was informed that care would be provided in this way and agreed to receive care in this manner.   _ _ _   _ __   _ __ _ _  __ __   _ __   __ _  History of Present Illness   71 yo woman with pmhx a fib on eliquis, DM2, HTN, prior hx CVA with residual R sided weakness admitted after syncopal episode. Neurology received urgent teleconsult for this patient for possible seizure activity after she had multiple shaking episodes since arrival to ED ultimately resulting in her being transferred to the ICU. She was alert and oriented throughout the tele-examination except for very brief periods of time, typically induced by asking her to lift an extremity, where she had irregular writhing jerking movements and crying out (not a guttural exclamation typical of seizures). She is responsive to noxious stimuli during these events (will withdraw and say ouch). Patient very tearful throughout exam, and when RN tested blink to threat she cried out that he was going to hit her (he reassured her he was not). RN confirmed that these results were typical of what he witnessed since her arrival to ICU. She has no hx seizures. No new neurologic deficits. Head CT in ED NAICP, personal review   ROS   Per HPI; all other systems reviewed and are negative  Past History   The following was personally reviewed:  Past Medical History:  Diagnosis Date   Arthritis    Chronic systolic (congestive) heart failure (HCC)    Diabetes mellitus without complication ([place])    DVT (deep venous thrombosis)  ([place]) 07/26/2021   Hypertension    Paroxysmal atrial fibrillation ([place]) 07/26/2021   Stroke [facility])    Past Surgical History:  Procedure Laterality Date   breast tumor     IR CATHETER TUBE CHANGE  09/25/2021   IR CHOLANGIOGRAM EXISTING TUBE  05/13/2021   IR EXCHANGE BILIARY DRAIN  07/27/2021   IR EXCHANGE BILIARY DRAIN  11/04/2021   IR EXCHANGE BILIARY DRAIN  01/08/2022   IR EXCHANGE BILIARY DRAIN  05/31/2022   IR EXCHANGE BILIARY DRAIN  06/12/2022   IR RADIOLOGIST EVAL & MGMT  09/04/2021   IR REMOVAL OF CALCULI/DEBRIS BILIARY DUCT/GB  09/25/2021   IR REMOVAL OF CALCULI/DEBRIS BILIARY DUCT/GB  11/04/2021   IR REMOVAL OF CALCULI/DEBRIS BILIARY DUCT/GB  01/08/2022   IR THORACENTESIS ASP PLEURAL SPACE W/IMG GUIDE  05/28/2022   PACEMAKER IMPLANT     TONSILLECTOMY     Family History  Problem Relation Age of Onset   Cancer Mother    Diabetes Mother    Heart disease Father    Social History   Socioeconomic History   Marital status: Divorced    Spouse name: Not on file   Number of children: Not on file   Years of education: Not on file   Highest education level: Not on file  Occupational History   Occupation: retired  Tobacco Use   Smoking status: Never   Smokeless tobacco: Never  Vaping Use   Vaping Use: Never used  Substance and Sexual Activity  Alcohol use: No   Drug use: Never   Sexual activity: Not on file  Other Topics Concern   Not on file  Social History Narrative   Not on file   Social Determinants of Health   Financial Resource Strain: Not on file  Food Insecurity: Not on file  Transportation Needs: Not on file  Physical Activity: Not on file  Stress: Not on file  Social Connections: Not on file   No Known Allergies  Medications   Medications Prior to Admission  Medication Sig Dispense Refill Last Dose   acetaminophen (TYLENOL) 325 MG tablet Take 2 tablets (650 mg total) by mouth every 6 (six) hours as needed for mild pain (or Fever >/= 101).      apixaban  (ELIQUIS) 5 MG TABS tablet Take 1 tablet (5 mg total) by mouth 2 (two) times daily. 60 tablet 0    carvedilol (COREG) 3.125 MG tablet Take 1 tablet (3.125 mg total) by mouth 2 (two) times daily with a meal.      docusate sodium (COLACE) 100 MG capsule Take 1 capsule (100 mg total) by mouth 2 (two) times daily. 10 capsule 0    feeding supplement, GLUCERNA SHAKE, (GLUCERNA SHAKE) LIQD Take 237 mLs by mouth 3 (three) times daily between meals.  0    furosemide (LASIX) 40 MG tablet Take 1 tablet (40 mg total) by mouth 2 (two) times daily. 180 tablet 3    hydrALAZINE (APRESOLINE) 50 MG tablet Take 1 tablet (50 mg total) by mouth every 8 (eight) hours.      hydrocortisone cream 1 % Apply topically 2 (two) times daily. 30 g 0    hydrOXYzine (ATARAX) 10 MG tablet Take 1 tablet (10 mg total) by mouth 3 (three) times daily as needed for itching. 30 tablet 0    insulin aspart (NOVOLOG) 100 UNIT/ML injection Inject 0-15 Units into the skin 3 (three) times daily with meals. Do NOT hold if patient is NPO. Moderate Scale. 10 mL 11    insulin aspart (NOVOLOG) 100 UNIT/ML injection Inject 0-5 Units into the skin at bedtime. Do NOT hold insulin if patient is NPO. 10 mL 11    insulin aspart (NOVOLOG) 100 UNIT/ML injection Inject 3 Units into the skin 3 (three) times daily with meals. 10 mL 11    insulin glargine-yfgn (SEMGLEE) 100 UNIT/ML injection Inject 0.15 mLs (15 Units total) into the skin daily. 10 mL 11    isosorbide mononitrate (IMDUR) 30 MG 24 hr tablet Take 0.5 tablets (15 mg total) by mouth daily.      midodrine (PROAMATINE) 2.5 MG tablet Take 1 tablet (2.5 mg total) by mouth 3 (three) times daily with meals.      Multiple Vitamin (MULTIVITAMIN WITH MINERALS) TABS tablet Take 1 tablet by mouth daily.      polyethylene glycol (MIRALAX / GLYCOLAX) 17 g packet Take 17 g by mouth daily as needed for mild constipation. 14 each 0    sodium chloride flush (NS) 0.9 % SOLN 5 mLs by Intracatheter route every 8  (eight) hours.      traZODone (DESYREL) 50 MG tablet Take 0.5 tablets (25 mg total) by mouth at bedtime as needed for sleep.         Current Facility-Administered Medications:    acetaminophen (TYLENOL) tablet 650 mg, 650 mg, Oral, Q6H PRN **OR** acetaminophen (TYLENOL) suppository 650 mg, 650 mg, Rectal, Q6H PRN, Manuella Ghazi, Pratik D, DO   carvedilol (COREG) tablet 3.125 mg, 3.125 mg, Oral,  BID WC, Shah, Pratik D, DO, 3.125 mg at 07/13/22 1723   diltiazem (CARDIZEM) 125 mg in dextrose 5% 125 mL (1 mg/mL) infusion, 5-15 mg/hr, Intravenous, Titrated, Manuella Ghazi, Pratik D, DO, Last Rate: 5 mL/hr at 07/13/22 1736, 5 mg/hr at 07/13/22 1736   hydrALAZINE (APRESOLINE) tablet 50 mg, 50 mg, Oral, Q8H, Shah, Pratik D, DO   HYDROmorphone (DILAUDID) injection 0.5-1 mg, 0.5-1 mg, Intravenous, Q2H PRN, Manuella Ghazi, Pratik D, DO   insulin aspart (novoLOG) injection 0-15 Units, 0-15 Units, Subcutaneous, Q4H, Shah, Pratik D, DO, 3 Units at 07/13/22 1724   isosorbide mononitrate (IMDUR) 24 hr tablet 15 mg, 15 mg, Oral, Daily, Manuella Ghazi, Pratik D, DO, 15 mg at 07/13/22 1723   lactated ringers infusion, , Intravenous, Continuous, Manuella Ghazi, Pratik D, DO, Last Rate: 75 mL/hr at 07/13/22 1728, New Bag at 07/13/22 1728   [COMPLETED] levETIRAcetam (KEPPRA) IVPB 1500 mg/ 100 mL premix, 1,500 mg, Intravenous, Once, Last Rate: 400 mL/hr at 07/13/22 1717, 1,500 mg at 07/13/22 1717 **FOLLOWED BY** [START ON 07/14/2022] levETIRAcetam (KEPPRA) IVPB 500 mg/100 mL premix, 500 mg, Intravenous, Q24H, Shah, Pratik D, DO   LORazepam (ATIVAN) injection 2 mg, 2 mg, Intravenous, Q4H PRN, Manuella Ghazi, Pratik D, DO   ondansetron (ZOFRAN) injection 4 mg, 4 mg, Intravenous, Q6H PRN, Manuella Ghazi, Pratik D, DO, 4 mg at 07/13/22 1600  Vitals   Vitals:   07/13/22 1700 07/13/22 1800 07/13/22 1900 07/13/22 1942  BP:  95/76 (!) 112/55   Pulse: 69 (!) 103 (!) 59   Resp: '15 16 11   '$ Temp:    (!) 97.1 F (36.2 C)  TempSrc:    Oral  SpO2: 99% 99% 100%   Weight:      Height:          Body mass index is 24.21 kg/m.  Physical Exam   Exam performed over telemedicine with 2-way video and audio communication and with assistance of bedside RN  Physical Exam Gen: A&O x4, tearful throughout exam Resp: normal WOB CV: extremities appear well-perfused  Neuro: *MS: A&O x4. Follows multi-step commands.  *Speech: mild dysarthria, would not cooperate with naming or repetition *CN: PERRL 2m, EOMI, blinks to threat bilat, sensation intact, smile symmetric, hearing intact to voice *Motor:   Normal bulk.  No tremor, rigidity or bradykinesia. RUE and RLE drift to bed. LUE and LLE drift but not to bed. She had multiple episodes of irregular writhing movements lasting ~10 sec associated with crying, typically triggered by asking her to lift an extremity. She did not respond to questions during these events unless noxious stimuli was applied. She had no disorientation immediately following the events. *Sensory: SILT. Symmetric.  *Coordination:  UTA 2/2 patient cooperation *Reflexes:  UTA 2/2 tele-exam *Gait: deferred  NIHSS  1a Level of Conscious.: 0 1b LOC Questions: 0 1c LOC Commands: 0 2 Best Gaze: 0 3 Visual: 0 4 Facial Palsy: 0 5a Motor Arm - left: 1 5b Motor Arm - Right: 2 6a Motor Leg - Left: 1 6b Motor Leg - Right: 2 7 Limb Ataxia: 0 8 Sensory: 0 9 Best Language: 0 10 Dysarthria: 1 11 Extinct. and Inatten.: 0  TOTAL: 7   Premorbid mRS = 3   Labs   CBC:  Recent Labs  Lab 07/13/22 1116  WBC 12.5*  NEUTROABS 8.2*  HGB 12.4  HCT 36.8  MCV 84.6  PLT 423*    Basic Metabolic Panel:  Lab Results  Component Value Date   NA 135 07/13/2022   K 4.0  07/13/2022   CO2 19 (L) 07/13/2022   GLUCOSE 160 (H) 07/13/2022   BUN 89 (H) 07/13/2022   CREATININE 3.01 (H) 07/13/2022   CALCIUM 8.8 (L) 07/13/2022   GFRNONAA 16 (L) 07/13/2022   GFRAA >90 01/17/2013   Lipid Panel:  Lab Results  Component Value Date   LDLCALC 52 05/26/2022   HgbA1c:  Lab  Results  Component Value Date   HGBA1C 9.0 (H) 05/26/2022   Urine Drug Screen: No results found for: "LABOPIA", "COCAINSCRNUR", "LABBENZ", "AMPHETMU", "THCU", "LABBARB"  Alcohol Level No results found for: "ETH"   Impression   72 yo woman with pmhx a fib on eliquis, DM2, HTN, prior hx CVA with residual R sided weakness admitted after syncopal episode. Neurology received urgent teleconsult for this patient for possible seizure activity. She was alert and oriented throughout my tele examination except for very brief periods of time, typically induced by asking her to lift an extremity, where she had irregular writhing jerking movements and crying out (not a guttural exclamation typical of seizures). Based on semiology witnessed on video there is concern for nonepileptic spells although I did not witness all of the episodes she had today and she has not had EEG therefore seizure activity cannot be ruled out. I therefore gave her '1mg'$  ativan and keppra load f/b '500mg'$  keppra q 24 hrs (renal dosing). I have asked Dr. Manuella Ghazi to put in a transfer request to [facility] for cEEG. I would not be overly aggressive in treating these spells unless vital signs become unstable with them. Will order brain MRI as well to r/o new ischemia (unable to get contrast for seizure protocol 2/2 creatinine of 3).  Recommendations   - Keppra load f/b '500mg'$  q 24 hrs (renal dosing) - '2mg'$  prn for prolonged seizure-like activity, would not give total of >'4mg'$  within 6 hr period or be more aggressive unless she has unstable vital signs. If there is concern for persistent generalized seizure activity lasting for >5 min or with unstable vital signs overnight place STAT teleneurology to [facility]pecialists. Otherwise place routine tele-consult to Dr. Hortense Ramal (see amion) for neurology f/u tomorrow - rEEG in AM - MRI brain wo contrast - Seizure precautions - Transfer request to [facility] for cEEG for spell characterization placed by Dr. Manuella Ghazi  Any copied  and pasted documentation in this note was written in another note by me (not separately billed for) and pasted here. ______________________________________________________________________   Thank you for the opportunity to take part in the care of this patient. If you have any further questions, please contact the neurology consultation attending.  Signed,  Su Monks, MD [facility] [phone]  If 7pm- 7am, please page neurology on call as listed in [place].

## 2022-07-14 ENCOUNTER — Observation Stay (HOSPITAL_COMMUNITY): Payer: Medicare HMO

## 2022-07-14 ENCOUNTER — Inpatient Hospital Stay (HOSPITAL_COMMUNITY)
Admit: 2022-07-14 | Discharge: 2022-07-14 | Disposition: A | Discharge status: Home / Self Care | Payer: Medicare HMO | Attending: Internal Medicine | Admitting: Internal Medicine

## 2022-07-14 DIAGNOSIS — N170 Acute kidney failure with tubular necrosis: Secondary | ICD-10-CM | POA: Diagnosis not present

## 2022-07-14 DIAGNOSIS — N179 Acute kidney failure, unspecified: Secondary | ICD-10-CM | POA: Diagnosis present

## 2022-07-14 DIAGNOSIS — Z794 Long term (current) use of insulin: Secondary | ICD-10-CM | POA: Diagnosis not present

## 2022-07-14 DIAGNOSIS — I5032 Chronic diastolic (congestive) heart failure: Secondary | ICD-10-CM | POA: Diagnosis present

## 2022-07-14 DIAGNOSIS — Z8249 Family history of ischemic heart disease and other diseases of the circulatory system: Secondary | ICD-10-CM | POA: Diagnosis not present

## 2022-07-14 DIAGNOSIS — I69351 Hemiplegia and hemiparesis following cerebral infarction affecting right dominant side: Secondary | ICD-10-CM | POA: Diagnosis not present

## 2022-07-14 DIAGNOSIS — F445 Conversion disorder with seizures or convulsions: Secondary | ICD-10-CM | POA: Diagnosis present

## 2022-07-14 DIAGNOSIS — R55 Syncope and collapse: Secondary | ICD-10-CM

## 2022-07-14 DIAGNOSIS — R569 Unspecified convulsions: Secondary | ICD-10-CM | POA: Diagnosis not present

## 2022-07-14 DIAGNOSIS — Z7901 Long term (current) use of anticoagulants: Secondary | ICD-10-CM

## 2022-07-14 DIAGNOSIS — I480 Paroxysmal atrial fibrillation: Secondary | ICD-10-CM | POA: Diagnosis present

## 2022-07-14 DIAGNOSIS — I639 Cerebral infarction, unspecified: Secondary | ICD-10-CM

## 2022-07-14 DIAGNOSIS — E1122 Type 2 diabetes mellitus with diabetic chronic kidney disease: Secondary | ICD-10-CM | POA: Diagnosis present

## 2022-07-14 DIAGNOSIS — E1165 Type 2 diabetes mellitus with hyperglycemia: Secondary | ICD-10-CM | POA: Diagnosis present

## 2022-07-14 DIAGNOSIS — I10 Essential (primary) hypertension: Secondary | ICD-10-CM

## 2022-07-14 DIAGNOSIS — Z86718 Personal history of other venous thrombosis and embolism: Secondary | ICD-10-CM | POA: Diagnosis not present

## 2022-07-14 DIAGNOSIS — N184 Chronic kidney disease, stage 4 (severe): Secondary | ICD-10-CM

## 2022-07-14 DIAGNOSIS — D649 Anemia, unspecified: Secondary | ICD-10-CM

## 2022-07-14 DIAGNOSIS — R748 Abnormal levels of other serum enzymes: Secondary | ICD-10-CM | POA: Diagnosis present

## 2022-07-14 DIAGNOSIS — J189 Pneumonia, unspecified organism: Secondary | ICD-10-CM | POA: Diagnosis not present

## 2022-07-14 DIAGNOSIS — G259 Extrapyramidal and movement disorder, unspecified: Secondary | ICD-10-CM | POA: Diagnosis not present

## 2022-07-14 DIAGNOSIS — Z79899 Other long term (current) drug therapy: Secondary | ICD-10-CM | POA: Diagnosis not present

## 2022-07-14 DIAGNOSIS — N1831 Chronic kidney disease, stage 3a: Secondary | ICD-10-CM | POA: Diagnosis present

## 2022-07-14 DIAGNOSIS — I130 Hypertensive heart and chronic kidney disease with heart failure and stage 1 through stage 4 chronic kidney disease, or unspecified chronic kidney disease: Secondary | ICD-10-CM | POA: Diagnosis present

## 2022-07-14 DIAGNOSIS — K811 Chronic cholecystitis: Secondary | ICD-10-CM | POA: Diagnosis present

## 2022-07-14 DIAGNOSIS — Z833 Family history of diabetes mellitus: Secondary | ICD-10-CM | POA: Diagnosis not present

## 2022-07-14 DIAGNOSIS — E1129 Type 2 diabetes mellitus with other diabetic kidney complication: Secondary | ICD-10-CM

## 2022-07-14 DIAGNOSIS — Z950 Presence of cardiac pacemaker: Secondary | ICD-10-CM | POA: Diagnosis not present

## 2022-07-14 DIAGNOSIS — D631 Anemia in chronic kidney disease: Secondary | ICD-10-CM | POA: Diagnosis present

## 2022-07-14 LAB — COMPREHENSIVE METABOLIC PANEL
ALT: 14 U/L (ref 0–44)
AST: 23 U/L (ref 15–41)
Albumin: 3.1 g/dL — ABNORMAL LOW (ref 3.5–5.0)
Alkaline Phosphatase: 59 U/L (ref 38–126)
Anion gap: 7 (ref 5–15)
BUN: 86 mg/dL — ABNORMAL HIGH (ref 8–23)
CO2: 24 mmol/L (ref 22–32)
Calcium: 8.7 mg/dL — ABNORMAL LOW (ref 8.9–10.3)
Chloride: 107 mmol/L (ref 98–111)
Creatinine, Ser: 3.15 mg/dL — ABNORMAL HIGH (ref 0.44–1.00)
GFR, Estimated: 15 mL/min — ABNORMAL LOW (ref >60)
Glucose, Bld: 107 mg/dL — ABNORMAL HIGH (ref 70–99)
Potassium: 4.1 mmol/L (ref 3.5–5.1)
Sodium: 138 mmol/L (ref 135–145)
Total Bilirubin: 0.8 mg/dL (ref 0.3–1.2)
Total Protein: 7.9 g/dL (ref 6.5–8.1)

## 2022-07-14 LAB — ECHOCARDIOGRAM COMPLETE
AR max vel: 3.01 cm2
AV Area VTI: 3.17 cm2
AV Area mean vel: 2.95 cm2
AV Mean grad: 2 mmHg
AV Peak grad: 4.7 mmHg
Ao pk vel: 1.08 m/s
Area-P 1/2: 2.53 cm2
Calc EF: 51.3 %
Height: 66 in
MV VTI: 2.84 cm2
S' Lateral: 3.3 cm
Single Plane A2C EF: 44.2 %
Single Plane A4C EF: 54.5 %
Weight: 2518.54 oz

## 2022-07-14 LAB — CBC
HCT: 35.5 % — ABNORMAL LOW (ref 36.0–46.0)
Hemoglobin: 11.3 g/dL — ABNORMAL LOW (ref 12.0–15.0)
MCH: 28.3 pg (ref 26.0–34.0)
MCHC: 31.8 g/dL (ref 30.0–36.0)
MCV: 89 fL (ref 80.0–100.0)
Platelets: 404 10*3/uL — ABNORMAL HIGH (ref 150–400)
RBC: 3.99 MIL/uL (ref 3.87–5.11)
RDW: 16.3 % — ABNORMAL HIGH (ref 11.5–15.5)
WBC: 10.5 10*3/uL (ref 4.0–10.5)
nRBC: 0 % (ref 0.0–0.2)

## 2022-07-14 LAB — GLUCOSE, CAPILLARY
Glucose-Capillary: 98 mg/dL (ref 70–99)
Glucose-Capillary: 85 mg/dL (ref 70–99)
Glucose-Capillary: 100 mg/dL — ABNORMAL HIGH (ref 70–99)
Glucose-Capillary: 104 mg/dL — ABNORMAL HIGH (ref 70–99)
Glucose-Capillary: 110 mg/dL — ABNORMAL HIGH (ref 70–99)

## 2022-07-14 LAB — MAGNESIUM: Magnesium: 3.3 mg/dL — ABNORMAL HIGH (ref 1.7–2.4)

## 2022-07-14 LAB — LIPASE, BLOOD: Lipase: 73 U/L — ABNORMAL HIGH (ref 11–51)

## 2022-07-14 MED ORDER — APIXABAN 5 MG PO TABS
5.0000 mg | ORAL_TABLET | Freq: Two times a day (BID) | ORAL | Status: DC
  Administered 2022-07-14 – 2022-07-20 (×12): 5 mg via ORAL
  Filled 2022-07-14 (×12): qty 1

## 2022-07-14 NOTE — Progress Notes (Signed)
Patient in SR, HR 60, BP soft. Cardizem gtt stopped, MD aware.

## 2022-07-14 NOTE — Progress Notes (Signed)
EEG complete - results pending 

## 2022-07-14 NOTE — Progress Notes (Signed)
Report called to Baxter Flattery, RN at [facility].  Advised that [facility] had called just prior with 30 minute ETA to [facility]

## 2022-07-14 NOTE — Hospital Course (Addendum)
Tracey Bryant is a 71 y.o. female with medical history significant for type 2 diabetes, hypertension, multiple CVAs with residual right-sided weakness, paroxysmal atrial fibrillation and history of DVT on Eliquis, CKD stage IV, and diastolic CHF who presented to the ED on account of a syncopal episode today.  Patient states that she knew she was about to pass out as she was trying to move towards her sofa and the next thing she recalls is finding herself on the floor.  Her phone was within reach so she was able to call EMS.  She lives alone, but her daughter lives next door to her.  She is normally ambulatory with walker at baseline, but has chronic right-sided weakness due to prior CVA.  She does claim to have had worsening generalized weakness over the past week with multiple episodes of near syncope and some chronic nausea.  She denies abdominal pain, fevers, chills, or diarrhea.  She does claim to be eating and drinking well otherwise.   ED Course:  Vital signs stable and patient is afebrile.  Leukocytosis of 12,500 noted.  BUN 89, creatinine 3.01.  CT head with no acute findings.  She was going to be admitted upstairs for syncope evaluation at which point in time she began having shaking spells resembling a possible seizure.  She was then transferred to stepdown unit.  Urgent tele-neurology consultation performed with recommendations to load with Keppra for now and obtain routine EEG.  She will require transfer to [facility] for continuous EEG monitoring when bed available.  She does not appear to have any actual seizure activity as witnessed by neurology.   Syncopal episode with associated fall at home -Associated with shaking spells that appear to not be related to seizures according to neurology, however providing coverage with Ativan and Keppra load for now -Ativan as needed for seizure, seizure precautions -Routine EEG -Will require transfer to [facility] once bed available for continuous EEG  monitoring, nonurgent -Appreciate further neurology recommendations with reevaluation -Obtain 2D echocardiogram -Carotid ultrasound   Prior history of CVAs -No acute findings of CVA on CT head   AKI on CKD stage IV -Baseline creatinine approximately 2.6-3 -Likely due to poor oral intake with chronic nausea -Continue gentle IV fluid while n.p.o. -Strict I's and O's -Repeat a.m. labs   Elevated lipase -EDP discussed case with GI -Does not appear to have pancreatitis at this time -Keep on IV fluid and n.p.o. for now given above findings -Monitor repeat labs   Type 2 diabetes with mild hyperglycemia -Hold home insulin -Maintain on SSI every 4 hours while here   Hypertension -Appears to be on midodrine as well as antihypertensives -Blood pressure currently elevated -Continue home medications with the exception of Lasix   Paroxysmal atrial fibrillation with RVR -Continue Eliquis for anticoagulation -IV Cardizem bolus given and started drip -Continue Coreg   History of chronic cholelithiasis/cholecystitis -Patient does have chronic biliary drain in place with tube last changed 06/12/2022   History of prior DVT -Continue Eliquis   Diastolic CHF -Currently appears slightly dry -Continue IV fluid and hold diuretics -Daily weights -Prior 2D echocardiogram 5/23 with LVEF 50-55% and grade 2 diastolic dysfunction

## 2022-07-14 NOTE — Progress Notes (Signed)
*  PRELIMINARY RESULTS* Echocardiogram 2D Echocardiogram has been performed.  Tracey Bryant 07/14/2022, 10:30 AM

## 2022-07-14 NOTE — Progress Notes (Signed)
Patient had seizure, witnessed by this RN. Seizure lasted approximately 15 seconds. Patient responsive only to sternal rub following seizure and oriented only to person for a couple of minutes after. Pupils dilated and patient unable to track or follow commands appropriately. Ativan provided. No injury occurred during seizure, vitals stable.

## 2022-07-14 NOTE — Plan of Care (Signed)
  Problem: Acute Rehab PT Goals(only PT should resolve) Goal: Pt Will Go Supine/Side To Sit Outcome: Progressing Flowsheets (Taken 07/14/2022 1600) Pt will go Supine/Side to Sit:  with moderate assist  with minimal assist Goal: Patient Will Transfer Sit To/From Stand Outcome: Progressing Flowsheets (Taken 07/14/2022 1600) Patient will transfer sit to/from stand:  with moderate assist  with minimal assist Goal: Pt Will Transfer Bed To Chair/Chair To Bed Outcome: Progressing Flowsheets (Taken 07/14/2022 1600) Pt will Transfer Bed to Chair/Chair to Bed:  with mod assist  with min assist Goal: Pt Will Perform Standing Balance Or Pre-Gait Outcome: Progressing Flowsheets (Taken 07/14/2022 1600) Pt will perform standing balance or pre-gait:  1-2 min  with bilateral UE support  with minimal assist  with moderate assist Goal: Pt Will Ambulate Outcome: Progressing Flowsheets (Taken 07/14/2022 1600) Pt will Ambulate:  50 feet  with minimal assist  with moderate assist  with rolling walker   4:01 PM, 07/14/22 Lestine Box, S/PT

## 2022-07-14 NOTE — Progress Notes (Signed)
PROGRESS NOTE    Patient: Tracey Bryant                            PCP: Coolidge Breeze, FNP                    DOB: [birth year]            DOA: 07/13/2022 [id number]             DOS: 07/14/2022, 11:08 AM   LOS: 0 days   Date of Service: The patient was seen and examined on 07/14/2022  Subjective:   The patient was seen and examined this morning. Stable at this time. Overnight patient had another episode of seizures around 4 AM, witnessed by RN approximately 15 seconds received Ativan... Please stable Pending discharge answered to [facility]  Currently on hold as patient has a pacemaker...  Brief Narrative:   Tracey Bryant is a 71 y.o. female with medical history significant for type 2 diabetes, hypertension, multiple CVAs with residual right-sided weakness, paroxysmal atrial fibrillation and history of DVT on Eliquis, CKD stage IV, and diastolic CHF who presented to the ED on account of a syncopal episode today.  Patient states that she knew she was about to pass out as she was trying to move towards her sofa and the next thing she recalls is finding herself on the floor.  Her phone was within reach so she was able to call EMS.  She lives alone, but her daughter lives next door to her.  She is normally ambulatory with walker at baseline, but has chronic right-sided weakness due to prior CVA.  She does claim to have had worsening generalized weakness over the past week with multiple episodes of near syncope and some chronic nausea.  She denies abdominal pain, fevers, chills, or diarrhea.  She does claim to be eating and drinking well otherwise.   ED Course:  Vital signs stable and patient is afebrile.  Leukocytosis of 12,500 noted.  BUN 89, creatinine 3.01.  CT head with no acute findings.  She was going to be admitted upstairs for syncope evaluation at which point in time she began having shaking spells resembling a possible seizure.  She was then transferred to stepdown unit.  Urgent  tele-neurology consultation performed with recommendations to load with Keppra for now and obtain routine EEG.  She will require transfer to [facility] for continuous EEG monitoring when bed available.  She does not appear to have any actual seizure activity as witnessed by neurology.      Assessment & Plan:   Principal Problem:   Syncope Active Problems:   Acute renal failure superimposed on stage 4 chronic kidney disease (HCC)   Type II diabetes mellitus (HCC)   Hypertension   Chronic anticoagulation   CVA (cerebral vascular accident) ([place])   Normocytic anemia     Syncopal episode with associated fall at home -Associated with shaking spells that appear to not be related to seizures according to neurology, however providing coverage with Ativan and Keppra load for now -Ativan as needed for seizure, seizure precautions  -Recurrent seizure episodes witnessed overnight this morning  -Routine EEG.... Neurology recommending 24-hour EEG -Will require transfer to [facility] once bed available for continuous EEG monitoring, nonurgent -Appreciate further neurology recommendations with reevaluation -MRI could not be completed AP due to patient's pacemaker-incompatibility -2D echocardiogram >>  -Carotid ultrasound >> No significant stenosis of internal  carotid arteries.   Prior history of CVAs -No acute findings of CVA on CT head   AKI on CKD stage IV -Baseline creatinine approximately 2.6-3 -Likely due to poor oral intake with chronic nausea -Continue gentle IV fluid while n.p.o. -Strict I's and O's -Creatinine 3.01>> 3.15 BUN 89, 86,   Elevated lipase -EDP discussed case with GI -Does not appear to have pancreatitis at this time -Keep on IV fluid and n.p.o. for now given above findings -Monitor repeat labs   Type 2 diabetes with mild hyperglycemia -Hold home insulin -Maintain on SSI every 4 hours while here   Hypertension -Appears to be on midodrine as well as  antihypertensives -Blood pressure currently elevated -Continue home medications with the exception of Lasix   Paroxysmal atrial fibrillation with RVR -Continue Eliquis for anticoagulation -IV Cardizem bolus given and started drip -Continue Coreg   History of chronic cholelithiasis/cholecystitis -Patient does have chronic biliary drain in place with tube last changed 06/12/2022   History of prior DVT -Continue Eliquis   Diastolic CHF -Currently appears slightly dry -Continue IV fluid and hold diuretics -Daily weights -Prior 2D echocardiogram 5/23 with LVEF 50-55% and grade 2 diastolic dysfunction      ------------------------------------------------------------------------------------------------------ Nutritional status:  The patient's BMI is: Body mass index is 25.41 kg/m. I agree with the assessment and plan as outlined     ------------------------------------------------------------------------------------------------------------------------------------------  DVT prophylaxis:    On Eliquis   Code Status:   Code Status: Full Code  Family Communication: Daughter updated The above findings and plan of care has been discussed with patient (and family)  in detail,  they expressed understanding and agreement of above. -Advance care planning has been discussed.   Admission status:   Status is: Inpatient Inpatient: Needing inpatient evaluation for ongoing seizures, neurology consulted, recommending 24-hour EEG... Close monitoring, neurochecks, neuro work-up including MRI     Procedures:   No admission procedures for hospital encounter.   Antimicrobials:  Anti-infectives (From admission, onward)    None        Medication:   carvedilol  3.125 mg Oral BID WC   Chlorhexidine Gluconate Cloth  6 each Topical Daily   hydrALAZINE  50 mg Oral Q8H   insulin aspart  0-15 Units Subcutaneous Q4H   isosorbide mononitrate  15 mg Oral Daily    acetaminophen  **OR** acetaminophen, HYDROmorphone (DILAUDID) injection, LORazepam, ondansetron (ZOFRAN) IV   Objective:   Vitals:   07/14/22 0600 07/14/22 0700 07/14/22 0800 07/14/22 0810  BP: (!) 100/58 (!) 106/47 (!) 112/50   Pulse: (!) 59 (!) 59 60   Resp: 17 (!) 9 13   Temp:    97.9 F (36.6 C)  TempSrc:    Axillary  SpO2: 99% 100% 100%   Weight:      Height:        Intake/Output Summary (Last 24 hours) at 07/14/2022 1108 Last data filed at 07/14/2022 0449 Gross per 24 hour  Intake 1336.48 ml  Output 30 ml  Net 1306.48 ml   Filed Weights   07/13/22 0935 07/14/22 0409  Weight: 68 kg 71.4 kg     Examination:   Physical Exam  Constitution:  Alert, cooperative, no distress,  Appears calm and comfortable  Psychiatric:   Normal and stable mood and affect, cognition intact,   HEENT:        Normocephalic, PERRL, otherwise with in Normal limits  Chest:         Chest symmetric Cardio vascular:  S1/S2, RRR, No  murmure, No Rubs or Gallops  pulmonary: Clear to auscultation bilaterally, respirations unlabored, negative wheezes / crackles Abdomen: Soft, non-tender, non-distended, bowel sounds,no masses, no organomegaly Muscular skeletal: Limited exam - in bed, able to move all 4 extremities,   Neuro: CNII-XII intact. , normal motor and sensation, reflexes intact  Extremities: No pitting edema lower extremities, +2 pulses  Skin: Dry, warm to touch, negative for any Rashes, No open wounds Wounds: per nursing documentation   ------------------------------------------------------------------------------------------------------------------------------------------    LABs:     Latest Ref Rng & Units 07/14/2022    6:17 AM 07/13/2022   11:16 AM 06/15/2022    9:38 AM  CBC  WBC 4.0 - 10.5 K/uL 10.5  12.5  6.6   Hemoglobin 12.0 - 15.0 g/dL 11.3  12.4  11.9   Hematocrit 36.0 - 46.0 % 35.5  36.8  37.7   Platelets 150 - 400 K/uL 404  423  424       Latest Ref Rng & Units 07/14/2022    6:17 AM  07/13/2022   11:16 AM 06/15/2022    9:38 AM  CMP  Glucose 70 - 99 mg/dL 107  160  158   BUN 8 - 23 mg/dL 86  89  35   Creatinine 0.44 - 1.00 mg/dL 3.15  3.01  2.36   Sodium 135 - 145 mmol/L 138  135  140   Potassium 3.5 - 5.1 mmol/L 4.1  4.0  4.1   Chloride 98 - 111 mmol/L 107  102  102   CO2 22 - 32 mmol/L '24  19  20   '$ Calcium 8.9 - 10.3 mg/dL 8.7  8.8  8.9   Total Protein 6.5 - 8.1 g/dL 7.9  8.8    Total Bilirubin 0.3 - 1.2 mg/dL 0.8  0.7    Alkaline Phos 38 - 126 U/L 59  66    AST 15 - 41 U/L 23  21    ALT 0 - 44 U/L 14  16         Micro Results No results found for this or any previous visit (from the past 240 hour(s)).  Radiology Reports No results found.  SIGNED: Deatra James, MD, FHM. [facility],  Pager (please use [email or web address] to page/text) Please use Epic Secure Chat for non-urgent communication (7AM-7PM)  If 7PM-7AM, please contact night-coverage [email or web address], 07/14/2022, 11:08 AM

## 2022-07-14 NOTE — Evaluation (Signed)
Physical Therapy Evaluation Patient Details Name: Tracey Bryant MRN: [id number] DOB: Mar 17, 1951 Today's Date: 07/14/2022  History of Present Illness  Tracey Bryant is a 71 y.o. female with medical history significant for type 2 diabetes, hypertension, multiple CVAs with residual right-sided weakness, paroxysmal atrial fibrillation and history of DVT on Eliquis, CKD stage IV, and diastolic CHF who presented to the ED on account of a syncopal episode today.  Patient states that she knew she was about to pass out as she was trying to move towards her sofa and the next thing she recalls is finding herself on the floor.  Her phone was within reach so she was able to call EMS.  She lives alone, but her daughter lives next door to her.  She is normally ambulatory with walker at baseline, but has chronic right-sided weakness due to prior CVA.  She does claim to have had worsening generalized weakness over the past week with multiple episodes of near syncope and some chronic nausea.  She denies abdominal pain, fevers, chills, or diarrhea.  She does claim to be eating and drinking well otherwise.   Clinical Impression  Patient presents supine in bed and consents to PT evaluation. Patient is mod assist with bed mobility demonstrating slow labored movement with extended time needed. Patient able to partially roll to L side but has difficulty with utilizing UE to press up into sitting position due to generalized weakness. Requires HHA and trunk assist to reach sitting position and scoot to EOB. Struggles with sitting balance at EOB requiring constant trunk support to maintain upright position. Patient demonstrates seizure like activity sitting at bedside with intermittently demonstrating periods of alertness. Evaluation limited by secondary seizure like complication and was left supine in bed with nursing staff addressing patient. Patient will benefit from continued skilled physical therapy in hospital and  recommended venue below to increase strength, balance, endurance for safe ADLs and gait.      Recommendations for follow up therapy are one component of a multi-disciplinary discharge planning process, led by the attending physician.  Recommendations may be updated based on patient status, additional functional criteria and insurance authorization.  Follow Up Recommendations Skilled nursing-short term rehab (<3 hours/day) Can patient physically be transported by private vehicle: No    Assistance Recommended at Discharge Intermittent Supervision/Assistance  Patient can return home with the following  A little help with walking and/or transfers;Help with stairs or ramp for entrance;Assist for transportation;Assistance with cooking/housework    Equipment Recommendations None recommended by PT  Recommendations for Other Services       Functional Status Assessment Patient has had a recent decline in their functional status and demonstrates the ability to make significant improvements in function in a reasonable and predictable amount of time.     Precautions / Restrictions Precautions Precautions: Fall Restrictions Weight Bearing Restrictions: No      Mobility  Bed Mobility Overal bed mobility: Needs Assistance Bed Mobility: Supine to Sit     Supine to sit: Mod assist     General bed mobility comments: Mod assist with supine to sit. Able to roll but difficulty with pressing up onto elbows requiring HHA due to UE weakness. Requires trunk assist to reach sitting position. Able to slightly scoot to EOB.    Transfers                        Ambulation/Gait  Stairs            Wheelchair Mobility    Modified Rankin (Stroke Patients Only)       Balance Overall balance assessment: Needs assistance Sitting-balance support: Bilateral upper extremity supported, Feet unsupported Sitting balance-Leahy Scale: Poor Sitting balance -  Comments: Poor sitting EOB and requires trunk stability.                                     Pertinent Vitals/Pain Pain Assessment Pain Assessment: No/denies pain    Home Living Family/patient expects to be discharged to:: Private residence Living Arrangements: Alone Available Help at Discharge: Family;Available 24 hours/day Type of Home: House Home Access: Stairs to enter Entrance Stairs-Rails: Can reach both;Right;Left Entrance Stairs-Number of Steps: 3   Home Layout: One level Home Equipment: Rollator (4 wheels);Shower seat;BSC/3in1;Grab bars - Statistician (2 wheels);Cane - single point Additional Comments: Patient daughter lives next door and she and her teenage children assist PRN    Prior Function Prior Level of Function : Independent/Modified Independent             Mobility Comments: Patient reports ambulating at home independently with rollator. Not ambulating in the community and not currenlty driving. ADLs Comments: Patient reports being independent with ADL's with assist from daughter prn. Uses delivery service for groceries/shopping.     Hand Dominance   Dominant Hand: Right    Extremity/Trunk Assessment   Upper Extremity Assessment Upper Extremity Assessment: Generalized weakness    Lower Extremity Assessment Lower Extremity Assessment: Generalized weakness    Cervical / Trunk Assessment Cervical / Trunk Assessment: Normal  Communication   Communication: No difficulties  Cognition Arousal/Alertness: Awake/alert Behavior During Therapy: WFL for tasks assessed/performed Overall Cognitive Status: Within Functional Limits for tasks assessed                                          General Comments      Exercises     Assessment/Plan    PT Assessment Patient needs continued PT services;All further PT needs can be met in the next venue of care  PT Problem List Decreased strength;Decreased activity  tolerance;Decreased balance;Decreased mobility;Decreased coordination       PT Treatment Interventions DME instruction;Gait training;Functional mobility training;Therapeutic activities;Therapeutic exercise;Balance training    PT Goals (Current goals can be found in the Care Plan section)  Acute Rehab PT Goals Patient Stated Goal: return home PT Goal Formulation: With patient Time For Goal Achievement: 07/28/22 Potential to Achieve Goals: Fair    Frequency Min 3X/week     Co-evaluation               AM-PAC PT "6 Clicks" Mobility  Outcome Measure Help needed turning from your back to your side while in a flat bed without using bedrails?: A Little Help needed moving from lying on your back to sitting on the side of a flat bed without using bedrails?: A Lot Help needed moving to and from a bed to a chair (including a wheelchair)?: A Lot Help needed standing up from a chair using your arms (e.g., wheelchair or bedside chair)?: A Lot Help needed to walk in hospital room?: A Lot Help needed climbing 3-5 steps with a railing? : A Lot 6 Click Score: 13    End of Session  Activity Tolerance: Treatment limited secondary to medical complications (Comment);No increased pain Patient left: in bed;with nursing/sitter in room Nurse Communication: Mobility status PT Visit Diagnosis: Unsteadiness on feet (R26.81);Other abnormalities of gait and mobility (R26.89);Muscle weakness (generalized) (M62.81)    Time: 5366-4403 PT Time Calculation (min) (ACUTE ONLY): 21 min   Charges:   PT Evaluation $PT Eval Moderate Complexity: 1 Mod PT Treatments $Therapeutic Activity: 8-22 mins        4:00 PM, 07/14/22 Lestine Box, S/PT

## 2022-07-14 NOTE — Progress Notes (Signed)
Unable to get ahold of or leave message with son [name] x 3 attempts at 2115, 2200, [phone] regarding transfer to [facility].  Left message for dtr Amy at [facility] regarding transfer with unit, room, phone number.

## 2022-07-14 NOTE — Procedures (Signed)
Patient Name: Tracey Bryant  MRN: [id number]  Epilepsy Attending: Lora Havens  Referring Physician/Provider: Heath Lark D,MD Date: 07/14/2022 Duration: 23.30 mins  Patient history: 71 yo woman with pmhx a fib on eliquis, DM2, HTN, prior hx CVA with residual R sided weakness admitted after syncopal episode. Neurology received urgent teleconsult for this patient for possible seizure activity. EEG to evaluate for seizure.  Level of alertness: Awake, asleep  AEDs during EEG study: LEV  Technical aspects: This EEG study was done with scalp electrodes positioned according to the 10-20 International system of electrode placement. Electrical activity was acquired at a sampling rate of '500Hz'$  and reviewed with a high frequency filter of '70Hz'$  and a low frequency filter of '1Hz'$ . EEG data were recorded continuously and digitally stored.   Description: The posterior dominant rhythm consists of 8-9 Hz activity of moderate voltage (25-35 uV) seen predominantly in posterior head regions, symmetric and reactive to eye opening and eye closing. Sleep was characterized by vertex waves, sleep spindles (12 to 14 Hz), maximal frontocentral region.    Hyperventilation and photic stimulation were not performed.     IMPRESSION: This study is within normal limits. No seizures or epileptiform discharges were seen throughout the recording.  Modupe Shampine Barbra Sarks

## 2022-07-14 NOTE — Progress Notes (Signed)
PROGRESS NOTE   Tracey Bryant  GEZ:[id number] DOB: 1951/01/16 DOA: 07/13/2022 PCP: Coolidge Breeze, FNP  Brief Narrative:  71 year old white female-Jehovah's Witness refuses blood Known A-fib CHADVASC >4 on Eliquis, HFrEF EF 25-30% + PPM, HTN, DM TY 2, cholecystitis status post cholecystotomy 02/2021 CKD 3 AA left popliteal DVT 03/2021 with history of CVAs at the same time Has a history of recurrent syncope--previously evaluated by neuro  Syncopal episodes of generalized worsening fatigue multiple episodes of near syncope with exertion-patient felt like she was getting off the couch Brought in for syncopal episode to [facility] 07/13/2022 blood pressure 202/97, CBG 196 Emergency room she was found to be bradycardic Because of shaking spells possible seizure--Teleme neuro was consulted and they loaded her with Keppra EEG was obtained She was also found to be in A-fib with started on Cardizem gtt.  Sodium 135 potassium 4.0 CO2 19 BUN/creatinine 89/3.0 lipase 102 WBC 12.5 platelet 423 CXR no active disease CT head multiple intracranial old infarcts in right cerebellar hemisphere with atrophy no acute intracranial findings on the non-Contrast CT  Patient was transferred to [facility] for continuous EEG  Hospital-Problem based course  Seizure versus other 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 Neurology seen the patient recommend continuation of continuous EEG today-events do not correspond with epileptic activity Continue Keppra for now and obtain MRI after EEG per them  would not treat seizures unless patient hemodynamically becomes unstable Psychiatry has been consulted given nonepileptic nature of these events A-fib CHADVASC >4 on Eliquis HFrEF EF 25-30% with pacemaker Continue Coreg 3.125 twice daily, continue Eliquis 5 twice daily Can continue Imdur 15 daily tendency to hypotension Midodrine has been held at this time-monitor Diabetes mellitus type  2 Long-acting 15 units held at this time continue sliding scale only at this time every 4 hourly Sugars ranging 90-178 Prior DVT April 2022 and CVAs at the same time Prior CVA may predispose to seizure Continue Eliquis as above CKD 4 Etiology probably underlying diabetes and HTN with poor forward flow GFR is close to 15 and will need outpatient discussion with renal Urine shows micro proteinuria so will require work-up Continue saline but changed to 75 cc/H LR  DVT prophylaxis: Eliquis Code Status: Full Family Communication: Called and discussed with patient's son Maud Deed [phone] and explained plan of care Disposition:  Status is: Observation The patient will require care spanning > 2 midnights and should be moved to inpatient because:   Needs MRI and further work-up   Consultants:  Neurology  Procedures: Continuous EEG  Antimicrobials: None   Subjective: Awake coherent had an episode where she flailed her hands while I was examining her-she did not hit her face when I lifted her hands above her head  Nursing reported she was very hungry so we started her diet   Objective: Vitals:   07/14/22 0409 07/14/22 0441 07/14/22 0500 07/14/22 0600  BP:  140/61 (!) 108/50 (!) 100/58  Pulse:  (!) 123 (!) 59 (!) 59  Resp:  '12 14 17  '$ Temp:      TempSrc:      SpO2:  100% 100% 99%  Weight: 71.4 kg     Height:        Intake/Output Summary (Last 24 hours) at 07/14/2022 0659 Last data filed at 07/14/2022 0449 Gross per 24 hour  Intake 1336.48 ml  Output 30 ml  Net 1306.48 ml   Filed Weights   07/13/22 0935 07/14/22 0409  Weight: 68 kg  71.4 kg    Examination:  EOMI NCAT no focal deficit Moving 4 limbs equally-smile symmetric Power is 5/5 Sensory deferred Chest clear no added sound Abdomen soft no rebound no guarding  Data Reviewed: personally reviewed   CBC    Component Value Date/Time   WBC 10.5 07/14/2022 0617   RBC 3.99 07/14/2022 0617   HGB 11.3  (L) 07/14/2022 0617   HGB 11.9 06/15/2022 0938   HCT 35.5 (L) 07/14/2022 0617   HCT 37.7 06/15/2022 0938   PLT 404 (H) 07/14/2022 0617   PLT 424 06/15/2022 0938   MCV 89.0 07/14/2022 0617   MCV 89 06/15/2022 0938   MCH 28.3 07/14/2022 0617   MCHC 31.8 07/14/2022 0617   RDW 16.3 (H) 07/14/2022 0617   RDW 14.7 06/15/2022 0938   LYMPHSABS 2.4 07/13/2022 1116   MONOABS 0.8 07/13/2022 1116   EOSABS 0.9 (H) 07/13/2022 1116   BASOSABS 0.1 07/13/2022 1116      Latest Ref Rng & Units 07/13/2022   11:16 AM 06/15/2022    9:38 AM 06/09/2022    3:04 AM  CMP  Glucose 70 - 99 mg/dL 160  158  131   BUN 8 - 23 mg/dL 89  35  40   Creatinine 0.44 - 1.00 mg/dL 3.01  2.36  2.68   Sodium 135 - 145 mmol/L 135  140  136   Potassium 3.5 - 5.1 mmol/L 4.0  4.1  4.1   Chloride 98 - 111 mmol/L 102  102  100   CO2 22 - 32 mmol/L '19  20  26   '$ Calcium 8.9 - 10.3 mg/dL 8.8  8.9  8.3   Total Protein 6.5 - 8.1 g/dL 8.8     Total Bilirubin 0.3 - 1.2 mg/dL 0.7     Alkaline Phos 38 - 126 U/L 66     AST 15 - 41 U/L 21     ALT 0 - 44 U/L 16        Radiology Studies: DG Hip Unilat W or Wo Pelvis 2-3 Views Right  Result Date: 07/13/2022 CLINICAL DATA:  Trauma, fall EXAM: DG HIP (WITH OR WITHOUT PELVIS) 2-3V RIGHT COMPARISON:  None Available. FINDINGS: No fracture or dislocation is seen. Arterial calcifications are seen in soft tissues. IMPRESSION: No fracture or dislocation is seen in right hip. Electronically Signed   By: Elmer Picker M.D.   On: 07/13/2022 10:44   DG Shoulder Right  Result Date: 07/13/2022 CLINICAL DATA:  Trauma, fall, pain EXAM: RIGHT SHOULDER - 2+ VIEW COMPARISON:  None Available. FINDINGS: No recent fracture or dislocation is seen in right shoulder. Osteopenia is seen in bony structures. Small smoothly marginated calcification adjacent to the [facility] joint may be due to degenerative arthritis. 2 mm faint calcification adjacent to the lateral margin of right acromion may be residual from  previous injury. IMPRESSION: No recent fracture or dislocation is seen in right shoulder. Electronically Signed   By: Elmer Picker M.D.   On: 07/13/2022 10:43   DG Chest 1 View  Result Date: 07/13/2022 CLINICAL DATA:  Syncope EXAM: CHEST  1 VIEW COMPARISON:  05/28/2022 FINDINGS: Transverse diameter of heart is slightly increased. There are no signs of pulmonary edema or focal pulmonary consolidation. There is interval decrease in pulmonary vascular congestion. There is no pleural effusion or pneumothorax. Pacemaker battery is seen in the left infraclavicular region. IMPRESSION: No active disease is seen in the chest. Electronically Signed   By: Prudy Feeler.D.  On: 07/13/2022 10:42   CT Head Wo Contrast  Result Date: 07/13/2022 CLINICAL DATA:  Syncope, fall, dizziness EXAM: CT HEAD WITHOUT CONTRAST TECHNIQUE: Contiguous axial images were obtained from the base of the skull through the vertex without intravenous contrast. RADIATION DOSE REDUCTION: This exam was performed according to the departmental dose-optimization program which includes automated exposure control, adjustment of the mA and/or kV according to patient size and/or use of iterative reconstruction technique. COMPARISON:  Previous CT done on 04/05/2021, MR brain done on 05/25/2022 FINDINGS: Brain: No acute intracranial findings are seen. There are no signs of bleeding within the cranium. There are areas of encephalomalacia in the right frontal, parietal and occipital lobes consistent with old infarcts. There is no focal edema or mass effect. Ventricles are not dilated. Cortical sulci are prominent. Vascular: There are scattered arterial calcifications. Skull: No fracture is seen in the calvarium. Sinuses/Orbits: Unremarkable. Other: None. IMPRESSION: No acute intracranial findings are seen in noncontrast CT brain. There are multiple old infarcts in right cerebellar hemisphere. Atrophy. Electronically Signed   By: Elmer Picker M.D.   On: 07/13/2022 10:28     Scheduled Meds:  carvedilol  3.125 mg Oral BID WC   Chlorhexidine Gluconate Cloth  6 each Topical Daily   hydrALAZINE  50 mg Oral Q8H   insulin aspart  0-15 Units Subcutaneous Q4H   isosorbide mononitrate  15 mg Oral Daily   Continuous Infusions:  diltiazem (CARDIZEM) infusion Stopped (07/13/22 2230)   lactated ringers 75 mL/hr at 07/14/22 0528   levETIRAcetam       LOS: 0 days   Time spent: [place], MD [facility] To contact the attending provider between 7A-7P or the covering provider during after hours 7P-7A, please log into the web site [email or web address] and access using universal [place] password for that web site. If you do not have the password, please call the hospital operator.  07/14/2022, 6:59 AM

## 2022-07-15 ENCOUNTER — Inpatient Hospital Stay (HOSPITAL_COMMUNITY): Payer: Medicare HMO

## 2022-07-15 DIAGNOSIS — G259 Extrapyramidal and movement disorder, unspecified: Secondary | ICD-10-CM | POA: Diagnosis not present

## 2022-07-15 DIAGNOSIS — R569 Unspecified convulsions: Secondary | ICD-10-CM | POA: Diagnosis not present

## 2022-07-15 LAB — COMPREHENSIVE METABOLIC PANEL
ALT: 14 U/L (ref 0–44)
AST: 20 U/L (ref 15–41)
Albumin: 2.7 g/dL — ABNORMAL LOW (ref 3.5–5.0)
Alkaline Phosphatase: 51 U/L (ref 38–126)
Anion gap: 14 (ref 5–15)
BUN: 70 mg/dL — ABNORMAL HIGH (ref 8–23)
CO2: 18 mmol/L — ABNORMAL LOW (ref 22–32)
Calcium: 8.7 mg/dL — ABNORMAL LOW (ref 8.9–10.3)
Chloride: 108 mmol/L (ref 98–111)
Creatinine, Ser: 2.72 mg/dL — ABNORMAL HIGH (ref 0.44–1.00)
GFR, Estimated: 18 mL/min — ABNORMAL LOW (ref >60)
Glucose, Bld: 90 mg/dL (ref 70–99)
Potassium: 4.4 mmol/L (ref 3.5–5.1)
Sodium: 140 mmol/L (ref 135–145)
Total Bilirubin: 1 mg/dL (ref 0.3–1.2)
Total Protein: 7.2 g/dL (ref 6.5–8.1)

## 2022-07-15 LAB — GLUCOSE, CAPILLARY
Glucose-Capillary: 100 mg/dL — ABNORMAL HIGH (ref 70–99)
Glucose-Capillary: 104 mg/dL — ABNORMAL HIGH (ref 70–99)
Glucose-Capillary: 163 mg/dL — ABNORMAL HIGH (ref 70–99)
Glucose-Capillary: 178 mg/dL — ABNORMAL HIGH (ref 70–99)
Glucose-Capillary: 185 mg/dL — ABNORMAL HIGH (ref 70–99)

## 2022-07-15 LAB — MRSA NEXT GEN BY PCR, NASAL: MRSA by PCR Next Gen: DETECTED — AB

## 2022-07-15 NOTE — Progress Notes (Signed)
Pt left facility for [facility].  Pt has Rt biliary drain not previously noted.  Drained of 150 ml drainage.

## 2022-07-15 NOTE — Progress Notes (Signed)
vLTM maintenance all impedances below 10kohms.  No skin breakdown noted at all skin sites 

## 2022-07-15 NOTE — Progress Notes (Signed)
Attempted to reach pt son [name] again regarding patient transfer to [facility] hospital.  Unable to reach son or leave VM.

## 2022-07-15 NOTE — Consult Note (Signed)
Neurology Consultation  Reason for Consult: spell capture Referring Physician: Manuella Ghazi  CC: seizure like activity  History is obtained from: Patient, chart review  HPI: Tracey Bryant is a 71 y.o. female with a past medical history of afib (on eliquis), DM2, HTN, prior hx of CVA with residual right sided weakness who was admitted after a syncopal episode. She states that she has fallen due to these episodes a few times since her strokes. During her examinations on 7/20 she had multiple episodes of shaking and jerking lasting up to 15 seconds and then about 30 seconds of inattention before returning to her previous state. She is alert and oriented prior to and after these episodes. At baseline she has residual right sided weakness and pain from her previous strokes and she uses a walker to ambulate. She denies numbness, tingling, nausea, vomiting, or headaches. She does endorse blurry vision.   She initially presented to [facility] after a syncopal episode, and subsequently had right-sided shaking spells for which she was evaluated by Dr. Quinn Axe on 7/18 who noted " Neurology received urgent teleconsult for this patient for possible seizure activity after she had multiple shaking episodes since arrival to ED ultimately resulting in her being transferred to the ICU. She was alert and oriented throughout the tele-examination except for very brief periods of time, typically induced by asking her to lift an extremity, where she had irregular writhing jerking movements and crying out (not a guttural exclamation typical of seizures). She is responsive to noxious stimuli during these events (will withdraw and say ouch). Patient very tearful throughout exam, and when RN tested blink to threat she cried out that he was going to hit her (he reassured her he was not). RN confirmed that these results were typical of what he witnessed since her arrival to ICU. She has no hx seizures. 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   Past Medical History:  Diagnosis Date   Arthritis    Chronic systolic (congestive) heart failure (HCC)    Diabetes mellitus without complication ([place])    DVT (deep venous thrombosis) ([place]) 07/26/2021   Hypertension    Paroxysmal atrial fibrillation ([place]) 07/26/2021   Stroke ([place])     Family History  Problem Relation Age of Onset   Cancer Mother    Diabetes Mother    Heart disease Father     Social History:   reports that she has never smoked. She has never used smokeless tobacco. She reports that she does not drink alcohol and does not use drugs.  Medications  Current Facility-Administered Medications:    acetaminophen (TYLENOL) tablet 650 mg, 650 mg, Oral, Q6H PRN **OR** acetaminophen (TYLENOL) suppository 650 mg,  650 mg, Rectal, Q6H PRN, Manuella Ghazi, Pratik D, DO   apixaban (ELIQUIS) tablet 5 mg, 5 mg, Oral, BID, Shahmehdi, Seyed A, MD, 5 mg at 07/14/22 1723   carvedilol (COREG) tablet 3.125 mg, 3.125 mg, Oral, BID WC, Shah, Pratik D, DO, 3.125 mg at 07/14/22 1725   Chlorhexidine Gluconate Cloth 2 % PADS 6 each, 6 each, Topical, Daily, Manuella Ghazi, Pratik D, DO, 6 each at 07/14/22 0915   diltiazem (CARDIZEM) 125 mg  in dextrose 5% 125 mL (1 mg/mL) infusion, 5-15 mg/hr, Intravenous, Titrated, Manuella Ghazi, Pratik D, DO, Stopped at 07/13/22 2230   hydrALAZINE (APRESOLINE) tablet 50 mg, 50 mg, Oral, Q8H, Shah, Pratik D, DO, 50 mg at 07/15/22 0540   HYDROmorphone (DILAUDID) injection 0.5-1 mg, 0.5-1 mg, Intravenous, Q2H PRN, Manuella Ghazi, Pratik D, DO   insulin aspart (novoLOG) injection 0-15 Units, 0-15 Units, Subcutaneous, Q4H, Shah, Pratik D, DO, 2 Units at 07/14/22 0055   isosorbide mononitrate (IMDUR) 24 hr tablet 15 mg, 15 mg, Oral, Daily, Manuella Ghazi, Pratik D, DO, 15 mg at 07/13/22 1723   lactated ringers infusion, , Intravenous, Continuous, Samtani, Jai-Gurmukh, MD, Last Rate: 75 mL/hr at 07/15/22 0543, New Bag at 07/15/22 0543   [COMPLETED] levETIRAcetam (KEPPRA) IVPB 1500 mg/ 100 mL premix, 1,500 mg, Intravenous, Once, Last Rate: 400 mL/hr at 07/13/22 1717, 1,500 mg at 07/13/22 1717 **FOLLOWED BY** levETIRAcetam (KEPPRA) IVPB 500 mg/100 mL premix, 500 mg, Intravenous, Q24H, Shah, Pratik D, DO, Stopped at 07/14/22 1737   LORazepam (ATIVAN) injection 2 mg, 2 mg, Intravenous, Q4H PRN, Manuella Ghazi, Pratik D, DO, 2 mg at 07/14/22 0439   ondansetron (ZOFRAN) injection 4 mg, 4 mg, Intravenous, Q6H PRN, Manuella Ghazi, Pratik D, DO, 4 mg at 07/13/22 1600  Exam: Current vital signs: BP (!) 144/54 (BP Location: Left Arm)   Pulse 60   Temp 98.1 F (36.7 C) (Oral)   Resp 18   Ht '5\' 6"'$  (1.676 m)   Wt 71.4 kg   SpO2 99%   BMI 25.41 kg/m  Vital signs in last 24 hours: Temp:  [96.9 F (36.1 C)-98.1 F (36.7 C)] 98.1 F (36.7 C) (07/20 0736) Pulse Rate:  [59-60] 60 (07/20 0736) Resp:  [15-20] 18 (07/20 0736) BP: (110-173)/(47-80) 144/54 (07/20 0736) SpO2:  [97 %-100 %] 99 % (07/20 0736)  GENERAL: Awake, alert in NAD HEENT: - Normocephalic and atraumatic, dry mm, no LN++, no Thyromegally LUNGS - Clear to auscultation bilaterally with no wheezes CV - S1S2 RRR, no m/r/g, equal pulses bilaterally. ABDOMEN - Soft, nontender, nondistended with  normoactive BS Ext: warm, well perfused, intact peripheral pulses, no edema  NEURO: Exam limited due to episode of jerking followed by non cooperation in exam.  Mental Status: alerted and oriented x2, incorrect month, but oriented to self location, year, and situation.  Language: speech is soft, but clear. Some hesitancy of speech Cranial Nerves: PERRL, saccadic dysmetria, left visual field deficit, no facial asymmetry, facial sensation intact, hearing intact, tongue/uvula/soft palate midline, head is midline. No evidence of tongue atrophy or fibrillations Motor: when asked to elevate right upper and lower extremities patient had irregular jerking movements captures on cEEG video lasting approximately 15 seconds both times. She was able to elevate her left upper and lower extremity without difficulty. After the second event, she had limited participation in the remainder of the neuro exam after her event. She expressed pain when attempting to lift her right upper extremity prior to her event. Full strength on the left.  RUE 3+/5 RLE 4/5 Dorsiflexion and  plantar flexion weaker on the right in comparison to the left Tone: is normal and bulk is normal Sensation- withdraws to pain in all extremities, sensation to light touch and temperature is intact Coordination:does not participate Gait- deferred  On attending MD examination, she is oriented to person place and situation.  She appears significantly psychiatrically distressed.  She reports she cannot even sit on the side of the bed due to being dizzy and off balance.  On attempt to sit on the side of the bed she has astasia-abasia and throws herself back into bed, without hitting any limbs or her head on the bed rails.  Labs I have reviewed labs in epic and the results pertinent to this consultation are:   Basic Metabolic Panel: Recent Labs  Lab 07/13/22 1116 07/14/22 0617 07/15/22 0856  NA 135 138 140  K 4.0 4.1 4.4  CL 102 107 108  CO2  19* 24 18*  GLUCOSE 160* 107* 90  BUN 89* 86* 70*  CREATININE 3.01* 3.15* 2.72*  CALCIUM 8.8* 8.7* 8.7*  MG  --  3.3*  --   Baseline BUN of 40, baseline creatinine 2.3-2.8 Liver function tests within normal limits  CBC: Recent Labs  Lab 07/13/22 1116 07/14/22 0617  WBC 12.5* 10.5  NEUTROABS 8.2*  --   HGB 12.4 11.3*  HCT 36.8 35.5*  MCV 84.6 89.0  PLT 423* 404*    Coagulation Studies: No results for input(s): "LABPROT", "INR" in the last 72 hours.   Lab Results  Component Value Date   TSH 1.350 07/13/2022   Lab Results  Component Value Date   LIPASE 73 (H) 07/14/2022  Improved from 102 on admission     Imaging I have reviewed the images obtained:  CT-head- no acute intracranial process, chronic infarcts of the right cerebral hemisphere personally reviewed by attending MD  MRI examination of the brain- pending  EEG, personally reviewed and agree with Dr. Hortense Ramal:  The posterior dominant rhythm consists of 8-9 Hz activity of moderate voltage (25-35 uV) seen predominantly in posterior head regions, symmetric and reactive to eye opening and eye closing. Sleep was characterized by vertex waves, sleep spindles (12 to 14 Hz), maximal frontocentral region. Hyperventilation and photic stimulation were not performed.      One event was recorded on 07/15/2022 at 0908 during which examiner asked patient to raise her arm and she had whole body violent jerking which lasted for 6 to 7 seconds. Concomitant EEG before, during and after the event did not show any EEG change to suggest seizure.   IMPRESSION: This study is within normal limits. No seizures or epileptiform discharges were seen throughout the recording.  Assessment: 71 year old woman with stroke risk factor of prior CVAs affecting the cortex, residual right-sided weakness, paroxysmal atrial fibrillation and history of DVT on Eliquis, stage IV chronic kidney disease, diastolic congestive heart failure.  Thus far spell  captured has no EEG correlate and is nonepileptic.  However she has been on Keppra and she does have a significant risk of post stroke epilepsy and therefore we will continue to monitor off of Keppra.  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  Discussed with patient that I do not think she is faking anything or doing anything deliberately, but that the help of psychiatry would aid in improving her function  -- Patient seen and examined by NP/APP with MD. MD to update note as needed.   Janine Ores, DNP, FNP-BC [facility] Pager: [phone]  Attending Neurologist's note:  I personally saw this patient, gathering history, performing a full neurologic examination, reviewing relevant labs, personally reviewing relevant imaging including, and formulated the assessment and plan, adding the note above for completeness and clarity to accurately reflect my thoughts

## 2022-07-15 NOTE — Progress Notes (Signed)
Patient arrived in the unit at 0100 am from [facility], A&Ox4, initiated telemetry monitor, pt resting in a bed, and will continue to monitor closely.

## 2022-07-15 NOTE — NC FL2 (Signed)
[place] LEVEL OF CARE SCREENING TOOL     IDENTIFICATION  Patient Name: Tracey Bryant Birthdate: [birth year] Sex: female Admission Date (Current Location): 07/13/2022  [facility] and [place] Number:  [employer] and Address:  [facility] [place]. [facility], [place] [address], [place], [place] [address]      Provider Number: [id number]  Attending Physician Name and Address:  Nita Sells, MD  Relative Name and Phone Number:       Current Level of Care: Hospital Recommended Level of Care: [place] Prior Approval Number:    Date Approved/Denied:   PASRR Number: [id number]  Discharge Plan: SNF    Current Diagnoses: Patient Active Problem List   Diagnosis Date Noted   Seizures ([place]) 07/14/2022   Syncope 96/75/9163   Acute diastolic HF (heart failure) ([place]) 06/27/2022   Acute CVA (cerebrovascular accident) ([place]) 06/09/2022   Dizziness 05/25/2022   History of CVA (cerebrovascular accident) 05/22/2022   Physical deconditioning 05/22/2022   Acute on chronic congestive heart failure ([place]) 05/20/2022   Acute on chronic systolic CHF (congestive heart failure) ([place]) 05/19/2022   Acute respiratory failure with hypoxia ([place]) 12/26/2021   Elevated troponin 12/26/2021   Elevated brain natriuretic peptide (BNP) level 12/26/2021   Hypoalbuminemia due to protein-calorie malnutrition ([place]) 12/26/2021   Acute renal failure superimposed on stage 4 chronic kidney disease ([place]) 12/26/2021   Acute exacerbation of CHF (congestive heart failure) ([place]) 12/25/2021   Acute on chronic HFrEF (heart failure with reduced ejection fraction) ([place]) 07/27/2021   Acute on chronic combined systolic and diastolic congestive heart failure ([place]) 07/26/2021   Paroxysmal atrial fibrillation ([place]) 07/26/2021   DVT (deep venous thrombosis) ([place]) 07/26/2021   Normocytic anemia 07/26/2021   Cholecystostomy tube dysfunction 07/26/2021   Moderate protein-calorie  malnutrition ([place]) 07/26/2021   Posterior cerebral artery embolism, right    Severe sepsis ([place]) 03/19/2021   Cholecystitis, chronic 03/19/2021   Acute lower UTI 03/19/2021   Cholelithiasis without cholangitis 03/18/2021   Uncontrolled type 2 diabetes mellitus with hyperglycemia, with long-term current use of insulin ([place]) 03/18/2021   DKA (diabetic ketoacidosis) ([place]) 03/16/2021   AKI (acute kidney injury) ([place]) 03/16/2021   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[place]) 03/16/2021   Acute posthemorrhagic anemia 03/16/2021   Epistaxis 03/16/2021   Chronic anticoagulation 03/16/2021   CVA (cerebral vascular accident) ([place]) 03/16/2021   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   Type II diabetes mellitus ([place]) 02/15/2013   Hypertension 02/15/2013   Gall bladder stones 02/15/2013   Multiple lung nodules 02/15/2013    Orientation RESPIRATION BLADDER Height & Weight     Self, Time, Situation, Place  Normal Continent Weight: 157 lb 6.5 oz (71.4 kg) Height:  '5\' 6"'$  (167.6 cm)  BEHAVIORAL SYMPTOMS/MOOD NEUROLOGICAL BOWEL NUTRITION STATUS      Continent Diet (regular)  AMBULATORY STATUS COMMUNICATION OF NEEDS Skin   Extensive Assist Verbally Skin abrasions                       Personal Care Assistance Level of Assistance  Bathing, Feeding, Dressing Bathing Assistance: Maximum assistance Feeding assistance: Maximum assistance Dressing Assistance: Maximum assistance     Functional Limitations Info             SPECIAL CARE FACTORS FREQUENCY  PT (By licensed PT), OT (By licensed OT)     PT Frequency: 5x/wk OT Frequency: 5x/wk            Contractures Contractures Info: Not  present    Additional Factors Info  Code Status, Allergies, Insulin Sliding Scale Code Status Info: Full Allergies Info: NKA   Insulin Sliding Scale Info: see DC summary       Current Medications (07/15/2022):  This is the current hospital active medication list Current Facility-Administered  Medications  Medication Dose Route Frequency Provider Last Rate Last Admin   acetaminophen (TYLENOL) tablet 650 mg  650 mg Oral Q6H PRN Manuella Ghazi, Pratik D, DO       Or   acetaminophen (TYLENOL) suppository 650 mg  650 mg Rectal Q6H PRN Manuella Ghazi, Pratik D, DO       apixaban (ELIQUIS) tablet 5 mg  5 mg Oral BID Shahmehdi, Seyed A, MD   5 mg at 07/15/22 1120   carvedilol (COREG) tablet 3.125 mg  3.125 mg Oral BID WC Manuella Ghazi, Pratik D, DO   3.125 mg at 07/15/22 1119   Chlorhexidine Gluconate Cloth 2 % PADS 6 each  6 each Topical Daily Manuella Ghazi, Pratik D, DO   6 each at 07/15/22 1024   diltiazem (CARDIZEM) 125 mg in dextrose 5% 125 mL (1 mg/mL) infusion  5-15 mg/hr Intravenous Titrated Manuella Ghazi, Pratik D, DO   Stopped at 07/13/22 2230   hydrALAZINE (APRESOLINE) tablet 50 mg  50 mg Oral Q8H Shah, Pratik D, DO   50 mg at 07/15/22 0540   HYDROmorphone (DILAUDID) injection 0.5-1 mg  0.5-1 mg Intravenous Q2H PRN Manuella Ghazi, Pratik D, DO       insulin aspart (novoLOG) injection 0-15 Units  0-15 Units Subcutaneous Q4H Shah, Pratik D, DO   2 Units at 07/14/22 0055   isosorbide mononitrate (IMDUR) 24 hr tablet 15 mg  15 mg Oral Daily Manuella Ghazi, Pratik D, DO   15 mg at 07/15/22 1119   lactated ringers infusion   Intravenous Continuous Nita Sells, MD 100 mL/hr at 07/15/22 1121 Rate Change at 07/15/22 1121   LORazepam (ATIVAN) injection 2 mg  2 mg Intravenous Q4H PRN Manuella Ghazi, Pratik D, DO   2 mg at 07/14/22 0439   ondansetron (ZOFRAN) injection 4 mg  4 mg Intravenous Q6H PRN Manuella Ghazi, Pratik D, DO   4 mg at 07/13/22 1600     Discharge Medications: Please see discharge summary for a list of discharge medications.  Relevant Imaging Results:  Relevant Lab Results:   Additional Information SS#: [id number]  Geralynn Ochs, LCSW

## 2022-07-15 NOTE — TOC Progression Note (Signed)
Transition of Care [facility]) - Progression Note    Patient Details  Name: Tracey Bryant MRN: [id number] Date of Birth: 08-14-1951  Transition of Care [facility]) CM/SW Contact  Loletha Grayer Beverely Pace, RN Phone Number: 07/15/2022, 3:53 PM  Clinical Narrative:   Case manager spoke with patient and her son, Tracey Bryant concerning need for short term Rehab at [facility]. [name] deferred to his mom since she is the one that has to stay at facility. Patient states she has been to [facility] in [place] and prefers not to go there, she has been to a SNF in [place] on [address], states she would like to go there when ready. CM updated SW of this. TOC Team will continue to follow.           Expected Discharge Plan and Services                                                 Social Determinants of Health (SDOH) Interventions    Readmission Risk Interventions    12/27/2021    2:32 PM 03/19/2021    9:34 AM 03/17/2021   11:56 AM  Readmission Risk Prevention Plan  Transportation Screening Complete  Complete  PCP or Specialist Appt within 5-7 Days  Complete   PCP or Specialist Appt within 3-5 Days Complete    Home Care Screening  Complete   Medication Review (RN CM)   Complete  HRI or Home Care Consult Complete    Social Work Consult for [place] Planning/Counseling Complete    Palliative Care Screening Not Applicable    Medication Review Press photographer) Complete

## 2022-07-15 NOTE — Progress Notes (Signed)
LTM EEG hooked up and running - no initial skin breakdown - push button tested - neuro notified. [facility] monitoring.  

## 2022-07-15 NOTE — Evaluation (Signed)
Occupational Therapy Evaluation Patient Details Name: Tracey Bryant MRN: [id number] DOB: 15-Apr-1951 Today's Date: 07/15/2022   History of Present Illness 71 yo female presenting to [facility] on 7/18 after passing out at home. Transfer to [facility] on 7/19 for neuro eval. EEG showing no seizures. CT head normal and no acute changes. Awaiting MRI. PMH including type 2 diabetes, hypertension, multiple CVAs with residual right-sided weakness, paroxysmal atrial fibrillation and history of DVT on Eliquis, CKD stage IV, and diastolic CHF.   Clinical Impression   PTA, pt was living alone and was independent. Pt currently requiring Max A for ADLs and functional transfers. Pt presenting with inconsistent thrashing/writhing movement "triggered" with RUE movement or certain positional changes. Pt engage in simple conversation and certain moments more engaged and attentive than others. Pt would benefit from further acute OT to facilitate safe dc. Recommend dc to SNF for further OT to optimize safety, independence with ADLs, and return to PLOF.      Recommendations for follow up therapy are one component of a multi-disciplinary discharge planning process, led by the attending physician.  Recommendations may be updated based on patient status, additional functional criteria and insurance authorization.   Follow Up Recommendations  Skilled nursing-short term rehab (<3 hours/day)    Assistance Recommended at Discharge Frequent or constant Supervision/Assistance  Patient can return home with the following Two people to help with walking and/or transfers;Two people to help with bathing/dressing/bathroom    Functional Status Assessment  Patient has had a recent decline in their functional status and demonstrates the ability to make significant improvements in function in a reasonable and predictable amount of time.  Equipment Recommendations  None recommended by OT    Recommendations for Other Services        Precautions / Restrictions Precautions Precautions: Fall Precaution Comments: Continuous EEG Restrictions Weight Bearing Restrictions: No      Mobility Bed Mobility Overal bed mobility: Needs Assistance Bed Mobility: Supine to Sit, Sit to Supine     Supine to sit: Max assist Sit to supine: Max assist   General bed mobility comments: Max A for trunk and LE control    Transfers Overall transfer level: Needs assistance   Transfers: Sit to/from Stand Sit to Stand: Max assist           General transfer comment: Max A for power up and to maitnain balance. Pt reporting "dont let me fall" in standing      Balance Overall balance assessment: Needs assistance Sitting-balance support: No upper extremity supported, Feet supported Sitting balance-Leahy Scale: Poor     Standing balance support: Bilateral upper extremity supported, During functional activity Standing balance-Leahy Scale: Poor                             ADL either performed or assessed with clinical judgement   ADL Overall ADL's : Needs assistance/impaired Eating/Feeding: NPO   Grooming: Maximal assistance;Bed level   Upper Body Bathing: Maximal assistance;Sitting   Lower Body Bathing: Maximal assistance;Sit to/from stand   Upper Body Dressing : Maximal assistance;Sitting   Lower Body Dressing: Maximal assistance;Sit to/from stand   Toilet Transfer: Maximal assistance (sit<>stand at EOB)           Functional mobility during ADLs: Maximal assistance (sit<>stand only) General ADL Comments: Pt presenting with inconsistent thrashing/writhing with positional change. Not thrashing when in standing despite prior presentations - which would have been consistent     Vision  Perception     Praxis      Pertinent Vitals/Pain Pain Assessment Pain Assessment: No/denies pain     Hand Dominance Right   Extremity/Trunk Assessment Upper Extremity Assessment Upper Extremity  Assessment: Generalized weakness;RUE deficits/detail RUE Deficits / Details: Inconsistent strength presentation. Able to lift off the bed and perform composite grasp. However, demonstrating "thrashing" or "writhing" movement of whole body when moving RUE. RUE Coordination: decreased fine motor;decreased gross motor   Lower Extremity Assessment Lower Extremity Assessment: Defer to PT evaluation       Communication Communication Communication: No difficulties   Cognition Arousal/Alertness: Lethargic Behavior During Therapy: Flat affect Overall Cognitive Status: Difficult to assess                                 General Comments: Pt with inconsistent with arousal and engagement. Able to answer simple questions. Increased fear of falling and repeating "Don't let me down on the floor"     General Comments  RN present during begining of session    Exercises     Shoulder Instructions      Home Living Family/patient expects to be discharged to:: Private residence Living Arrangements: Alone Available Help at Discharge: Family;Available 24 hours/day Type of Home: House Home Access: Stairs to enter [employer] of Steps: 3 Entrance Stairs-Rails: Can reach both;Right;Left Home Layout: One level     Bathroom Shower/Tub: Teacher, early years/pre: Standard Bathroom Accessibility: Yes   Home Equipment: Rollator (4 wheels);Shower seat;BSC/3in1;Grab bars - Statistician (2 wheels);Cane - single point   Additional Comments: Patient daughter lives next door and she and her teenage children assist PRN      Prior Functioning/Environment Prior Level of Function : Independent/Modified Independent                        OT Problem List: Decreased strength;Decreased range of motion;Decreased activity tolerance;Impaired balance (sitting and/or standing);Decreased knowledge of use of DME or AE;Decreased knowledge of precautions;Impaired  UE functional use;Pain      OT Treatment/Interventions: Self-care/ADL training;Therapeutic exercise;DME and/or AE instruction;Energy conservation;Therapeutic activities;Patient/family education    OT Goals(Current goals can be found in the care plan section) Acute Rehab OT Goals Patient Stated Goal: "What is wrong with me?" OT Goal Formulation: With patient Time For Goal Achievement: 07/29/22 Potential to Achieve Goals: Good  OT Frequency: Min 2X/week    Co-evaluation PT/OT/SLP Co-Evaluation/Treatment: Yes Reason for Co-Treatment: Complexity of the patient's impairments (multi-system involvement);Necessary to address cognition/behavior during functional activity;For patient/therapist safety;To address functional/ADL transfers   OT goals addressed during session: ADL's and self-care      AM-PAC OT "6 Clicks" Daily Activity     Outcome Measure Help from another person eating meals?: Total Help from another person taking care of personal grooming?: A Lot Help from another person toileting, which includes using toliet, bedpan, or urinal?: A Lot Help from another person bathing (including washing, rinsing, drying)?: A Lot Help from another person to put on and taking off regular upper body clothing?: A Lot Help from another person to put on and taking off regular lower body clothing?: A Lot 6 Click Score: 11   End of Session Nurse Communication: Mobility status  Activity Tolerance: Patient limited by lethargy Patient left: in bed;with call bell/phone within reach;with bed alarm set  OT Visit Diagnosis: Other abnormalities of gait and mobility (R26.89);Unsteadiness on feet (R26.81);Muscle weakness (generalized) (M62.81)  Time: 5638-7564 OT Time Calculation (min): 12 min Charges:  OT General Charges $OT Visit: 1 Visit OT Evaluation $OT Eval Moderate Complexity: 1 Mod  Amer Alcindor MSOT, OTR/L Acute Rehab Office: [place] 07/15/2022, 2:32  PM

## 2022-07-15 NOTE — Progress Notes (Signed)
Physical Therapy Treatment Patient Details Name: Tracey Bryant MRN: [id number] DOB: Mar 25, 1951 Today's Date: 07/15/2022   History of Present Illness 71 yo female presenting to [facility] on 7/18 after passing out at home. Transfer to [facility] on 7/19 for neuro eval. EEG showing no seizures. CT head normal and no acute changes. Awaiting MRI. PMH including type 2 diabetes, hypertension, multiple CVAs with residual right-sided weakness, paroxysmal atrial fibrillation and history of DVT on Eliquis, CKD stage IV, and diastolic CHF.    PT Comments    Pt making slower gains toward goals, limited by inconsistent thrashing/writhing movement "triggered" with RUE and bil LE movement or certain positional changes.  Emphasis on transitions, sitting balance, standing and observation of what elicited these movements.  Limited by continuous EEG monitoring.   Recommendations for follow up therapy are one component of a multi-disciplinary discharge planning process, led by the attending physician.  Recommendations may be updated based on patient status, additional functional criteria and insurance authorization.  Follow Up Recommendations  Skilled nursing-short term rehab (<3 hours/day) Can patient physically be transported by private vehicle: No   Assistance Recommended at Discharge Intermittent Supervision/Assistance  Patient can return home with the following A little help with walking and/or transfers;Help with stairs or ramp for entrance;Assist for transportation;Assistance with cooking/housework   Equipment Recommendations  None recommended by PT    Recommendations for Other Services       Precautions / Restrictions Precautions Precautions: Fall Precaution Comments: Continuous EEG     Mobility  Bed Mobility Overal bed mobility: Needs Assistance Bed Mobility: Supine to Sit, Sit to Supine     Supine to sit: Max assist Sit to supine: Max assist   General bed mobility comments: Max A for trunk and  LE control    Transfers Overall transfer level: Needs assistance   Transfers: Sit to/from Stand Sit to Stand: Max assist           General transfer comment: Max A for power up and to maitnain balance. Pt reporting "dont let me fall" in standing    Ambulation/Gait                   Stairs             Wheelchair Mobility    Modified Rankin (Stroke Patients Only)       Balance Overall balance assessment: Needs assistance Sitting-balance support: No upper extremity supported, Feet supported Sitting balance-Leahy Scale: Poor     Standing balance support: Bilateral upper extremity supported, During functional activity Standing balance-Leahy Scale: Poor                              Cognition Arousal/Alertness: Lethargic Behavior During Therapy: Flat affect Overall Cognitive Status: Difficult to assess                                 General Comments: Pt with inconsistent with arousal and engagement. Able to answer simple questions. Increased fear of falling and repeating "Don't let me down on the floor"        Exercises      General Comments General comments (skin integrity, edema, etc.): RN present to witness pt's responses to limb movement and changes in position      Pertinent Vitals/Pain Pain Assessment Pain Assessment: No/denies pain    Home Living Family/patient expects to be discharged  to:: Private residence Living Arrangements: Alone Available Help at Discharge: Family;Available 24 hours/day Type of Home: House Home Access: Stairs to enter Entrance Stairs-Rails: Can reach both;Right;Left Entrance Stairs-Number of Steps: 3   Home Layout: One level Home Equipment: Rollator (4 wheels);Shower seat;BSC/3in1;Grab bars - Statistician (2 wheels);Cane - single point Additional Comments: Patient daughter lives next door and she and her teenage children assist PRN    Prior Function            PT  Goals (current goals can now be found in the care plan section) Acute Rehab PT Goals PT Goal Formulation: With patient Time For Goal Achievement: 07/28/22 Potential to Achieve Goals: Fair Progress towards PT goals: Progressing toward goals    Frequency    Min 3X/week      PT Plan Current plan remains appropriate    Co-evaluation PT/OT/SLP Co-Evaluation/Treatment: Yes Reason for Co-Treatment: Complexity of the patient's impairments (multi-system involvement) PT goals addressed during session: Mobility/safety with mobility OT goals addressed during session: ADL's and self-care      AM-PAC PT "6 Clicks" Mobility   Outcome Measure  Help needed turning from your back to your side while in a flat bed without using bedrails?: A Little Help needed moving from lying on your back to sitting on the side of a flat bed without using bedrails?: A Lot Help needed moving to and from a bed to a chair (including a wheelchair)?: A Lot Help needed standing up from a chair using your arms (e.g., wheelchair or bedside chair)?: A Lot Help needed to walk in hospital room?: Total Help needed climbing 3-5 steps with a railing? : Total 6 Click Score: 11    End of Session   Activity Tolerance: Treatment limited secondary to medical complications (Comment);No increased pain Patient left: in bed;with call bell/phone within reach Nurse Communication: Mobility status PT Visit Diagnosis: Unsteadiness on feet (R26.81);Other abnormalities of gait and mobility (R26.89);Muscle weakness (generalized) (M62.81)     Time: 1610-9604 PT Time Calculation (min) (ACUTE ONLY): 32 min  Charges:  $Therapeutic Activity: 8-22 mins                     07/15/2022  Ginger Carne., PT Acute Rehabilitation Services [phone]  (pager) [phone]  (office)   Tracey Bryant 07/15/2022, 3:20 PM

## 2022-07-15 NOTE — Procedures (Addendum)
Patient Name: Tracey Bryant  MRN: [id number]  Epilepsy Attending: Lora Havens  Referring Physician/Provider: Donnetta Simpers, MD  Duration: 07/15/2022 1884 to 07/16/2022 1660   Patient history: 71 yo woman with pmhx a fib on eliquis, DM2, HTN, prior hx CVA with residual R sided weakness admitted after syncopal episode. Neurology received urgent teleconsult for this patient for possible seizure activity. EEG to evaluate for seizure.   Level of alertness: Awake, asleep   AEDs during EEG study: LEV   Technical aspects: This EEG study was done with scalp electrodes positioned according to the 10-20 International system of electrode placement. Electrical activity was acquired at a sampling rate of '500Hz'$  and reviewed with a high frequency filter of '70Hz'$  and a low frequency filter of '1Hz'$ . EEG data were recorded continuously and digitally stored.    Description: The posterior dominant rhythm consists of 8-9 Hz activity of moderate voltage (25-35 uV) seen predominantly in posterior head regions, symmetric and reactive to eye opening and eye closing. Sleep was characterized by vertex waves, sleep spindles (12 to 14 Hz), maximal frontocentral region. Hyperventilation and photic stimulation were not performed.     One event was recorded on 07/15/2022 at 0908 during which examiner asked patient to raise her arm and she had whole body violent jerking which lasted for 6 to 7 seconds. Concomitant EEG before, during and after the event did not show any EEG change to suggest seizure.  One event was recorded on 07/15/2022 at 1336 during which she had whole body violent jerking which lasted for 3-4 seconds. Concomitant EEG before, during and after the event did not show any EEG change to suggest seizure.  One event was recorded on 07/15/2022 at 1343 during which she had whole body jerking and side-to-side head movement which lasted for 5 to 7 seconds.  She was working with physical therapy who then helped her  sit up at the edge of the bed.  While sitting up, she had side-to-side movement of her head as well as jerking.  Concomitant EEG before, during and after the event did not show any EEG change to suggest seizure.   IMPRESSION: This study is within normal limits. No seizures or epileptiform discharges were seen throughout the recording.  Three events were recorded on 07/15/2022 as described above without concomitant EEG change.  These events were NON-epileptic.   Cohen Boettner Barbra Sarks

## 2022-07-16 ENCOUNTER — Inpatient Hospital Stay (HOSPITAL_COMMUNITY): Payer: Medicare HMO

## 2022-07-16 DIAGNOSIS — F445 Conversion disorder with seizures or convulsions: Principal | ICD-10-CM

## 2022-07-16 DIAGNOSIS — R55 Syncope and collapse: Secondary | ICD-10-CM | POA: Diagnosis not present

## 2022-07-16 LAB — COMPREHENSIVE METABOLIC PANEL
ALT: 13 U/L (ref 0–44)
AST: 20 U/L (ref 15–41)
Albumin: 2.8 g/dL — ABNORMAL LOW (ref 3.5–5.0)
Alkaline Phosphatase: 55 U/L (ref 38–126)
Anion gap: 9 (ref 5–15)
BUN: 63 mg/dL — ABNORMAL HIGH (ref 8–23)
CO2: 21 mmol/L — ABNORMAL LOW (ref 22–32)
Calcium: 8.8 mg/dL — ABNORMAL LOW (ref 8.9–10.3)
Chloride: 108 mmol/L (ref 98–111)
Creatinine, Ser: 2.71 mg/dL — ABNORMAL HIGH (ref 0.44–1.00)
GFR, Estimated: 18 mL/min — ABNORMAL LOW (ref >60)
Glucose, Bld: 110 mg/dL — ABNORMAL HIGH (ref 70–99)
Potassium: 4.2 mmol/L (ref 3.5–5.1)
Sodium: 138 mmol/L (ref 135–145)
Total Bilirubin: 0.9 mg/dL (ref 0.3–1.2)
Total Protein: 7.6 g/dL (ref 6.5–8.1)

## 2022-07-16 LAB — GLUCOSE, CAPILLARY
Glucose-Capillary: 124 mg/dL — ABNORMAL HIGH (ref 70–99)
Glucose-Capillary: 140 mg/dL — ABNORMAL HIGH (ref 70–99)
Glucose-Capillary: 152 mg/dL — ABNORMAL HIGH (ref 70–99)
Glucose-Capillary: 155 mg/dL — ABNORMAL HIGH (ref 70–99)
Glucose-Capillary: 183 mg/dL — ABNORMAL HIGH (ref 70–99)
Glucose-Capillary: 222 mg/dL — ABNORMAL HIGH (ref 70–99)

## 2022-07-16 MED ORDER — HALOPERIDOL LACTATE 5 MG/ML IJ SOLN: 2.0000 mg | Freq: Four times a day (QID) | INTRAMUSCULAR | Status: DC | PRN

## 2022-07-16 MED ORDER — MELATONIN 5 MG PO TABS
5.0000 mg | ORAL_TABLET | Freq: Once | ORAL | Status: AC
Start: 2022-07-16 — End: 2022-07-16
  Administered 2022-07-16: 5 mg via ORAL
  Filled 2022-07-16: qty 1

## 2022-07-16 NOTE — Progress Notes (Addendum)
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 Cont EEG and MRI neg for pathology Keppra D/c PEr Neuro Psychiatry has been consulted--no furthe rinput required A-fib CHADVASC >4 on Eliquis HFrEF EF 25-30% with pacemaker Continue Coreg 3.125 twice daily, continue Eliquis 5 twice daily Can continue Imdur 15 daily Has paced rhythm on monitor tendency to hypotension Midodrine has been held at this time-monitor Diabetes mellitus type 2 Long-acting 15 units held at this time continue sliding scale only at this time every 4 hourly Sugars ranging 100-150 Prior DVT April 2022 and CVAs at the same time Prior CVA may  predispose to seizure Continue Eliquis as above CKD 4 Etiology probably underlying diabetes and HTN with poor forward flow GFR is close to 15 and will need outpatient discussion with renal Urine shows micro proteinuria so will require work-up Continue saline but changed to 75 cc/H LR--d/c in am Chr biliary drain 02/2021 Needs eventual removal of the same?  DVT prophylaxis: Eliquis Code Status: Full Family Communication: Called and discussed with patient's son Tracey Bryant [phone] and explained plan of care Disposition:  Status is: Observation The patient will require care spanning > 2 midnights and should be moved to inpatient because:   Needs MRI and further work-up   Consultants:  Neurology  Procedures: Continuous EEG  Antimicrobials: None   Subjective:  Pleasant coherent  1 episode of non epileptic activity   Objective: Vitals:   07/16/22 0430 07/16/22 0736 07/16/22 1422 07/16/22 1522  BP: (!) 127/56 (!) 128/46 (!) 141/53 (!) 121/42  Pulse: 60 62 63 60  Resp: '11 17 17   '$ Temp: 98 F (36.7 C) 98.6 F (37 C) 98.6 F (37 C) 98.3 F (36.8 C)  TempSrc: Oral Oral Oral Oral  SpO2: 99% 100% 96% 97%  Weight:      Height:        Intake/Output Summary (Last 24 hours) at 07/16/2022 1711 Last data filed at 07/16/2022 0815 Gross per 24 hour  Intake 1999.4 ml  Output 1100 ml  Net 899.4 ml    Filed Weights   07/13/22 0935 07/14/22 0409  Weight: 68 kg 71.4 kg    Examination:  EOMI NCAT no focal deficit Moving 4 limbs equally-smile symmetric moving 4 limbs Power is 5/5 Sensory deferred Chest clear no added sound Abdomen  soft no rebound no guarding  Data Reviewed: personally reviewed   CBC    Component Value Date/Time   WBC 10.5 07/14/2022 0617   RBC 3.99 07/14/2022 0617   HGB 11.3 (L) 07/14/2022 0617   HGB 11.9 06/15/2022 0938   HCT 35.5 (L) 07/14/2022 0617   HCT 37.7 06/15/2022 0938   PLT 404 (H) 07/14/2022 0617   PLT 424 06/15/2022 0938   MCV  89.0 07/14/2022 0617   MCV 89 06/15/2022 0938   MCH 28.3 07/14/2022 0617   MCHC 31.8 07/14/2022 0617   RDW 16.3 (H) 07/14/2022 0617   RDW 14.7 06/15/2022 0938   LYMPHSABS 2.4 07/13/2022 1116   MONOABS 0.8 07/13/2022 1116   EOSABS 0.9 (H) 07/13/2022 1116   BASOSABS 0.1 07/13/2022 1116      Latest Ref Rng & Units 07/16/2022    5:41 AM 07/15/2022    8:56 AM 07/14/2022    6:17 AM  CMP  Glucose 70 - 99 mg/dL 110  90  107   BUN 8 - 23 mg/dL 63  70  86   Creatinine 0.44 - 1.00 mg/dL 2.71  2.72  3.15   Sodium 135 - 145 mmol/L 138  140  138   Potassium 3.5 - 5.1 mmol/L 4.2  4.4  4.1   Chloride 98 - 111 mmol/L 108  108  107   CO2 22 - 32 mmol/L '21  18  24   '$ Calcium 8.9 - 10.3 mg/dL 8.8  8.7  8.7   Total Protein 6.5 - 8.1 g/dL 7.6  7.2  7.9   Total Bilirubin 0.3 - 1.2 mg/dL 0.9  1.0  0.8   Alkaline Phos 38 - 126 U/L 55  51  59   AST 15 - 41 U/L '20  20  23   '$ ALT 0 - 44 U/L '13  14  14      '$ Radiology Studies: MR BRAIN WO CONTRAST  Result Date: 07/16/2022 CLINICAL DATA:  Seizure, new onset EXAM: MRI HEAD WITHOUT CONTRAST TECHNIQUE: Multiplanar, multiecho pulse sequences of the brain and surrounding structures were obtained without intravenous contrast. COMPARISON:  05/25/2022 MRI FINDINGS: Brain: No restricted diffusion to suggest acute or subacute infarct. No acute hemorrhage, mass, mass effect, or midline shift. No hydrocephalus or extra-axial collection. Redemonstrated chronic infarcts in the inferior right frontal, right parietal, right posterior temporal, and right occipital lobes. Scattered T2 hyperintense signal in the periventricular white matter, likely the sequela of chronic small vessel ischemic disease. Hemosiderin deposition along the medial left temporal lobe, likely sequela of prior microhemorrhage. Hemosiderin deposition is also associated with the right temporal lobe infarct. Vascular: Normal arterial flow voids. Skull and upper cervical spine: Normal marrow signal. Sinuses/Orbits:  Mucous retention cysts in the right maxillary sinus. Mucosal thickening in the ethmoid air cells. The orbits are unremarkable. Other: Fluid in the bilateral mastoid air cells. IMPRESSION: No acute intracranial process. No evidence of acute or subacute infarct. Electronically Signed   By: Merilyn Baba M.D.   On: 07/16/2022 14:06   Overnight EEG with video  Result Date: 07/15/2022 Lora Havens, MD     07/16/2022  8:52 AM Patient Name: Tracey Bryant MRN: [id number] Epilepsy Attending: Lora Havens Referring Physician/Provider: Donnetta Simpers, MD Duration: 07/15/2022 7654 to 07/16/2022 6503  Patient history: 71 yo woman with pmhx a fib on eliquis, DM2, HTN, prior hx CVA with residual R sided weakness admitted after syncopal episode. Neurology received urgent teleconsult for this patient for possible seizure  activity. EEG to evaluate for seizure.  Level of alertness: Awake, asleep  AEDs during EEG study: LEV  Technical aspects: This EEG study was done with scalp electrodes positioned according to the 10-20 International system of electrode placement. Electrical activity was acquired at a sampling rate of '500Hz'$  and reviewed with a high frequency filter of '70Hz'$  and a low frequency filter of '1Hz'$ . EEG data were recorded continuously and digitally stored.  Description: The posterior dominant rhythm consists of 8-9 Hz activity of moderate voltage (25-35 uV) seen predominantly in posterior head regions, symmetric and reactive to eye opening and eye closing. Sleep was characterized by vertex waves, sleep spindles (12 to 14 Hz), maximal frontocentral region. Hyperventilation and photic stimulation were not performed.   One event was recorded on 07/15/2022 at 0908 during which examiner asked patient to raise her arm and she had whole body violent jerking which lasted for 6 to 7 seconds. Concomitant EEG before, during and after the event did not show any EEG change to suggest seizure. One event was recorded on  07/15/2022 at 1336 during which she had whole body violent jerking which lasted for 3-4 seconds. Concomitant EEG before, during and after the event did not show any EEG change to suggest seizure. One event was recorded on 07/15/2022 at 1343 during which she had whole body jerking and side-to-side head movement which lasted for 5 to 7 seconds.  She was working with physical therapy who then helped her sit up at the edge of the bed.  While sitting up, she had side-to-side movement of her head as well as jerking.  Concomitant EEG before, during and after the event did not show any EEG change to suggest seizure.  IMPRESSION: This study is within normal limits. No seizures or epileptiform discharges were seen throughout the recording. Three events were recorded on 07/15/2022 as described above without concomitant EEG change.  These events were NON-epileptic.  Lora Havens   EEG adult  Result Date: 07/14/2022 Lora Havens, MD     07/14/2022  6:50 PM Patient Name: Zain D Blevins MRN: [id number] Epilepsy Attending: Lora Havens Referring Physician/Provider: Heath Lark D,MD Date: 07/14/2022 Duration: 23.30 mins Patient history: 71 yo woman with pmhx a fib on eliquis, DM2, HTN, prior hx CVA with residual R sided weakness admitted after syncopal episode. Neurology received urgent teleconsult for this patient for possible seizure activity. EEG to evaluate for seizure. Level of alertness: Awake, asleep AEDs during EEG study: LEV Technical aspects: This EEG study was done with scalp electrodes positioned according to the 10-20 International system of electrode placement. Electrical activity was acquired at a sampling rate of '500Hz'$  and reviewed with a high frequency filter of '70Hz'$  and a low frequency filter of '1Hz'$ . EEG data were recorded continuously and digitally stored. Description: The posterior dominant rhythm consists of 8-9 Hz activity of moderate voltage (25-35 uV) seen predominantly in posterior head regions,  symmetric and reactive to eye opening and eye closing. Sleep was characterized by vertex waves, sleep spindles (12 to 14 Hz), maximal frontocentral region.    Hyperventilation and photic stimulation were not performed.   IMPRESSION: This study is within normal limits. No seizures or epileptiform discharges were seen throughout the recording. Priyanka Barbra Sarks     Scheduled Meds:  apixaban  5 mg Oral BID   carvedilol  3.125 mg Oral BID WC   Chlorhexidine Gluconate Cloth  6 each Topical Daily   hydrALAZINE  50 mg Oral Q8H  insulin aspart  0-15 Units Subcutaneous Q4H   isosorbide mononitrate  15 mg Oral Daily   Continuous Infusions:  lactated ringers 75 mL/hr at 07/16/22 1425     LOS: 2 days   Time spent: [place], MD [facility] To contact the attending provider between 7A-7P or the covering provider during after hours 7P-7A, please log into the web site [email or web address] and access using universal [place] password for that web site. If you do not have the password, please call the hospital operator.  07/16/2022, 5:11 PM

## 2022-07-16 NOTE — Care Management Important Message (Signed)
Important Message  Patient Details  Name: Tracey Bryant MRN: [id number] Date of Birth: 1951-07-25   Medicare Important Message Given:  Yes     [name] Montine Circle 07/16/2022, 3:56 PM

## 2022-07-16 NOTE — Procedures (Addendum)
Patient Name: Tracey Bryant  MRN: [id number]  Epilepsy Attending: Lora Havens  Referring Physician/Provider: Donnetta Simpers, MD  Duration: 07/16/2022 0336 to 07/16/2022 1145   Patient history: 71 yo woman with pmhx a fib on eliquis, DM2, HTN, prior hx CVA with residual R sided weakness admitted after syncopal episode. Neurology received urgent teleconsult for this patient for possible seizure activity. EEG to evaluate for seizure.   Level of alertness: Awake, asleep   AEDs during EEG study: LEV   Technical aspects: This EEG study was done with scalp electrodes positioned according to the 10-20 International system of electrode placement. Electrical activity was acquired at a sampling rate of '500Hz'$  and reviewed with a high frequency filter of '70Hz'$  and a low frequency filter of '1Hz'$ . EEG data were recorded continuously and digitally stored.    Description: The posterior dominant rhythm consists of 8-9 Hz activity of moderate voltage (25-35 uV) seen predominantly in posterior head regions, symmetric and reactive to eye opening and eye closing. Sleep was characterized by vertex waves, sleep spindles (12 to 14 Hz), maximal frontocentral region. Hyperventilation and photic stimulation were not performed.     One event was recorded on 07/16/2022 at 0919 during which patient had whole body violent jerking. Concomitant EEG before, during and after the event did not show any EEG change to suggest seizure.    IMPRESSION: This study is within normal limits. No seizures or epileptiform discharges were seen throughout the recording.  One event was recorded on 07/16/2022 at 0919 as described above without concomitant EEG change.  This was NOT an epileptic event   Layden Caterino Barbra Sarks

## 2022-07-16 NOTE — Progress Notes (Signed)
Subjective: Patient reports having intermittent full body jerking.  States she has not had similar symptoms before.  Denies any stress.  However when I further asked her about her health, she became tearful and reported struggling with fluid intake, concerns about her heart and kidney function.  Also reported as she has been missing a family member that she lost.  ROS: negative except above  Examination  Vital signs in last 24 hours: Temp:  [97.6 F (36.4 C)-98.7 F (37.1 C)] 98.6 F (37 C) (07/21 0736) Pulse Rate:  [60-62] 62 (07/21 0736) Resp:  [11-20] 17 (07/21 0736) BP: (117-128)/(41-56) 128/46 (07/21 0736) SpO2:  [98 %-100 %] 100 % (07/21 0736)  General: lying in bed, NAD Neuro: MS: Alert, oriented, follows commands CN: pupils equal and reactive,  EOMI, face symmetric, tongue midline, normal sensation over face, Motor: 5/5 strength in all 4 extremities Reflexes: 2+ bilaterally over patella, biceps, plantars: flexor Coordination: normal Gait: not tested  Basic Metabolic Panel: Recent Labs  Lab 07/13/22 1116 07/14/22 0617 07/15/22 0856 07/16/22 0541  NA 135 138 140 138  K 4.0 4.1 4.4 4.2  CL 102 107 108 108  CO2 19* 24 18* 21*  GLUCOSE 160* 107* 90 110*  BUN 89* 86* 70* 63*  CREATININE 3.01* 3.15* 2.72* 2.71*  CALCIUM 8.8* 8.7* 8.7* 8.8*  MG  --  3.3*  --   --     CBC: Recent Labs  Lab 07/13/22 1116 07/14/22 0617  WBC 12.5* 10.5  NEUTROABS 8.2*  --   HGB 12.4 11.3*  HCT 36.8 35.5*  MCV 84.6 89.0  PLT 423* 404*     Coagulation Studies: No results for input(s): "LABPROT", "INR" in the last 72 hours.  Imaging MRI brain without contrast 07/16/2022: No acute intracranial process. No evidence of acute or subacute infarct.  ASSESSMENT AND PLAN: 71 year old female noted to have seizure-like activity consistent with nonepileptic events on EEG.  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  These events  were nonepileptic.  Recommendations - Patient does not need to get any antiepileptics - Discussed diagnosis of nonepileptic events with patient.  Patient understands and agrees with the diagnosis.   -Will defer further management to psychiatry team -Discontinue LTM EEG -Does not need follow-up with neurology  Thank you for allowing [place] participate in the care of this patient.  Neurology will sign off.  Please call us for any further questions.  I have spent a total of  40  minutes with the patient reviewing hospital notes,  test results, labs and examining the patient as well as establishing an assessment and plan that was discussed personally with the patient.  > 50% of time was spent in direct patient care.    Zeb Comfort Epilepsy [facility] For questions after 5pm please refer to AMION to reach the Neurologist on call

## 2022-07-16 NOTE — Consult Note (Signed)
[facility] New Face-to-Face Psychiatric Evaluation   Service Date: July 16, 2022 LOS:  LOS: 2 days    Assessment  Tracey Bryant is a 71 y.o. female admitted medically for 07/13/2022  9:17 AM for syncopal episode. She carries the psychiatric diagnoses of none and has a past medical history of type 2 diabetes, hypertension, multiple CVAs with residual right-sided weakness, benign paroxysmal positional vertigo (BPPV), CKD stage IV and diastolic CHF.Psychiatry was consulted for psychosomatic symptoms by Dr. Verlon Au.   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 The patient notes her symptoms during this episode are similar to how they were in the past when she had vertigo. Patient notes having an episode of vertigo during a previous admission at [facility] in which she was told she had BPPV in which maneuvers were performed and the symptoms were relieved.  Patient's EEG is normal which rules out seizure and epilepsy.  With the symptoms listed above, patient is most likely experiencing vertigo secondary to PNES or BPPV.  She does not meet criteria for a psychiatric condition at this time as she denies suicidal ideation, symptoms of mania, and AVH.  Continue to monitor worsening symptoms of vertigo and we recommend avoiding triggers such as lying down or standing up too quickly, or rapidly bending forward.  Please see plan below for detailed recommendations.   Diagnoses:  Active Hospital problems: Principal Problem:   Syncope Active Problems:   Type II diabetes mellitus (HCC)   Hypertension   Chronic anticoagulation   CVA (cerebral vascular accident) ([place])   Normocytic anemia   Acute renal failure superimposed on stage 4 chronic kidney disease ([place])    Seizures ([place])     Plan  ## Safety and Observation Level:  - Based on my clinical evaluation, I estimate the patient to be at low risk of self harm in the current setting - At this time, we recommend a routine level of observation. This decision is based on my review of the chart including patient's history and current presentation, interview of the patient, mental status examination, and consideration of suicide risk including evaluating suicidal ideation, plan, intent, suicidal or self-harm behaviors, risk factors, and protective factors. This judgment is based on our ability to directly address suicide risk, implement suicide prevention strategies and develop a safety plan while the patient is in the clinical setting. Please contact our team if there is a concern that risk level has changed.   ## Medications:  -- No medication recommendations at this time as EEG results are normal and patient is not reporting mood or thought disturbances   ## Medical Decision Making Capacity:  Not formally assessed  ## Further Work-up:  -Per neurology EEG results, This study is within normal limits. No seizures or epileptiform discharges were seen throughout the recording.  ## Disposition:  -- Per primary team   Thank you for this consult request. Recommendations have been communicated to the primary team.  We will sign off at this time.   Stormy Fabian, MD   NEW  history  Relevant Aspects of Hospital Course:  Admitted on 07/13/2022 for syncopal episode.  Patient Report:  Patient was seen in bed this morning hooked up to the the EEG machine.  Patient describes coming to the hospital due  to having a syncopal episode.  Prior to the episode, she felt nauseous and had low appetite.  Regarding the seizure-like episode, she describes it as "when they lifted one of my arms especially the right arm, I began to flip and flop and this lasted for a couple minutes".  She notes this only happening in the  hospital.  She says nurses and a doctor witness the seizure-like episode and she reports having difficulty speaking because "I just could not and it lasted a minute or 2".  She recalls in one of her past hospital admissions a few years ago, there was a similar episode where she was dizzy and she was told she had vertigo.  They performed maneuvers on her and she noted relief from the maneuvers.  She was told she had BPPV.  She also recalls another time when she was dehydrated during another hospital admission she had a similar episode.  Patient denies a past psychiatric history and reports not taking psychiatric medications in the past.  She denies low mood, SI, HI, AVH.   Psychiatric History:  Information collected from patient  Family psych history: Denies   Social History:  Patient has a son and a daughter.  Family History:   The patient's family history includes Cancer in her mother; Diabetes in her mother; Heart disease in her father.  Medical History: Past Medical History:  Diagnosis Date   Arthritis    Chronic systolic (congestive) heart failure (HCC)    Diabetes mellitus without complication (HCC)    DVT (deep venous thrombosis) ([place]) 07/26/2021   Hypertension    Paroxysmal atrial fibrillation (HCC) 07/26/2021   Stroke [facility])     Surgical History: Past Surgical History:  Procedure Laterality Date   breast tumor     IR CATHETER TUBE CHANGE  09/25/2021   IR CHOLANGIOGRAM EXISTING TUBE  05/13/2021   IR EXCHANGE BILIARY DRAIN  07/27/2021   IR EXCHANGE BILIARY DRAIN  11/04/2021   IR EXCHANGE BILIARY DRAIN  01/08/2022   IR EXCHANGE BILIARY DRAIN  05/31/2022   IR EXCHANGE BILIARY DRAIN  06/12/2022   IR RADIOLOGIST EVAL & MGMT  09/04/2021   IR REMOVAL OF CALCULI/DEBRIS BILIARY DUCT/GB  09/25/2021   IR REMOVAL OF CALCULI/DEBRIS BILIARY DUCT/GB  11/04/2021   IR REMOVAL OF CALCULI/DEBRIS BILIARY DUCT/GB  01/08/2022   IR THORACENTESIS ASP PLEURAL SPACE W/IMG GUIDE  05/28/2022   PACEMAKER  IMPLANT     TONSILLECTOMY      Medications:   Current Facility-Administered Medications:    acetaminophen (TYLENOL) tablet 650 mg, 650 mg, Oral, Q6H PRN **OR** acetaminophen (TYLENOL) suppository 650 mg, 650 mg, Rectal, Q6H PRN, Manuella Ghazi, Pratik D, DO   apixaban (ELIQUIS) tablet 5 mg, 5 mg, Oral, BID, Shahmehdi, Seyed A, MD, 5 mg at 07/16/22 7371   carvedilol (COREG) tablet 3.125 mg, 3.125 mg, Oral, BID WC, Shah, Pratik D, DO, 3.125 mg at 07/16/22 0626   Chlorhexidine Gluconate Cloth 2 % PADS 6 each, 6 each, Topical, Daily, Heath Lark D, DO, 6 each at 07/16/22 0834   hydrALAZINE (APRESOLINE) tablet 50 mg, 50 mg, Oral, Q8H, Shah, Pratik D, DO, 50 mg at 07/16/22 0531   HYDROmorphone (DILAUDID) injection 0.5-1 mg, 0.5-1 mg, Intravenous, Q2H PRN, Manuella Ghazi, Pratik D, DO   insulin aspart (novoLOG) injection 0-15 Units, 0-15 Units, Subcutaneous, Q4H, Shah, Pratik D, DO, 2 Units at 07/16/22 9485   isosorbide mononitrate (IMDUR) 24 hr tablet 15 mg, 15 mg, Oral, Daily, Shah, Pratik D, DO, 15 mg at  07/16/22 0832   lactated ringers infusion, , Intravenous, Continuous, Samtani, Jai-Gurmukh, MD, Last Rate: 75 mL/hr at 07/15/22 1650, Rate Change at 07/15/22 1650   LORazepam (ATIVAN) injection 2 mg, 2 mg, Intravenous, Q4H PRN, Manuella Ghazi, Pratik D, DO, 2 mg at 07/14/22 0439   ondansetron (ZOFRAN) injection 4 mg, 4 mg, Intravenous, Q6H PRN, Manuella Ghazi, Pratik D, DO, 4 mg at 07/13/22 1600  Allergies: No Known Allergies     Objective  Vital signs:  Temp:  [97.6 F (36.4 C)-98.7 F (37.1 C)] 98.6 F (37 C) (07/21 0736) Pulse Rate:  [59-62] 62 (07/21 0736) Resp:  [11-20] 17 (07/21 0736) BP: (117-128)/(41-58) 128/46 (07/21 0736) SpO2:  [98 %-100 %] 100 % (07/21 0736)  Psychiatric Specialty Exam:  Presentation  General Appearance: Appropriate for Environment  Eye Contact:Fair  Speech:Clear and Coherent  Speech Volume:Normal  Handedness:No data recorded  Mood and Affect   Mood:Euthymic  Affect:Congruent   Thought Process  Thought Processes:Coherent; Linear  Descriptions of Associations:Intact  Orientation:Full (Time, Place and Person)  Thought Content:Logical  History of Schizophrenia/Schizoaffective disorder:No data recorded Duration of Psychotic Symptoms:No data recorded Hallucinations:Hallucinations: None  Ideas of Reference:None  Suicidal Thoughts:Suicidal Thoughts: No  Homicidal Thoughts:Homicidal Thoughts: No   Sensorium  Memory:Immediate Good; Recent Fair; Remote Fair  Judgment:Fair  Insight:Fair   Executive Functions  Concentration:Good  Attention Span:Good  [place]  Language:Good   Psychomotor Activity  Psychomotor Activity:Psychomotor Activity: Normal   Assets  Assets:No data recorded  Sleep  Sleep:Sleep: Fair    Physical Exam: Physical Exam Constitutional:      Appearance: Normal appearance.  Pulmonary:     Effort: Pulmonary effort is normal.  Neurological:     Mental Status: She is alert and oriented to person, place, and time.  Psychiatric:        Mood and Affect: Mood normal.        Behavior: Behavior normal.    Review of Systems  Constitutional:  Negative for chills, fever and weight loss.  Neurological:  Negative for tremors and headaches.  Psychiatric/Behavioral:  Negative for depression, hallucinations and suicidal ideas.    Blood pressure (!) 128/46, pulse 62, temperature 98.6 F (37 C), temperature source Oral, resp. rate 17, height '5\' 6"'$  (1.676 m), weight 71.4 kg, SpO2 100 %. Body mass index is 25.41 kg/m.

## 2022-07-16 NOTE — TOC Progression Note (Signed)
Transition of Care [facility]) - Progression Note    Patient Details  Name: Talayla D Doyon MRN: [id number] Date of Birth: Jul 27, 1951  Transition of Care [facility]) CM/SW [place], [place] Phone Number: 07/16/2022, 3:53 PM  Clinical Narrative:   CSW met with patient to provide bed offers. Patient unsure of the SNF she was at previously, just that it was off [facility] in [place]. [facility] is off [facility] and has offered a bed, patient in agreement. CSW left a voicemail for Admissions at [facility] to ask about bed availability and insurance authorization, awaiting call back. CSW to follow.    Expected Discharge Plan: Skilled Nursing Facility Barriers to Discharge: Continued Medical Work up, Ship broker  Expected Discharge Plan and Services Expected Discharge Plan: [place]                                               Social Determinants of Health (SDOH) Interventions    Readmission Risk Interventions    12/27/2021    2:32 PM 03/19/2021    9:34 AM 03/17/2021   11:56 AM  Readmission Risk Prevention Plan  Transportation Screening Complete  Complete  PCP or Specialist Appt within 5-7 Days  Complete   PCP or Specialist Appt within 3-5 Days Complete    Home Care Screening  Complete   Medication Review (RN CM)   Complete  HRI or Home Care Consult Complete    Social Work Consult for [place] Planning/Counseling Complete    Palliative Care Screening Not Applicable    Medication Review Press photographer) Complete

## 2022-07-17 DIAGNOSIS — R55 Syncope and collapse: Secondary | ICD-10-CM | POA: Diagnosis not present

## 2022-07-17 LAB — CBC
HCT: 18.3 % — ABNORMAL LOW (ref 36.0–46.0)
Hemoglobin: 6.1 g/dL — CL (ref 12.0–15.0)
MCH: 28.9 pg (ref 26.0–34.0)
MCHC: 33.3 g/dL (ref 30.0–36.0)
MCV: 86.7 fL (ref 80.0–100.0)
Platelets: 301 10*3/uL (ref 150–400)
RBC: 2.11 MIL/uL — ABNORMAL LOW (ref 3.87–5.11)
RDW: 17 % — ABNORMAL HIGH (ref 11.5–15.5)
WBC: 7.2 10*3/uL (ref 4.0–10.5)
nRBC: 0 % (ref 0.0–0.2)

## 2022-07-17 LAB — BASIC METABOLIC PANEL
Anion gap: 7 (ref 5–15)
BUN: 49 mg/dL — ABNORMAL HIGH (ref 8–23)
CO2: 19 mmol/L — ABNORMAL LOW (ref 22–32)
Calcium: 8.1 mg/dL — ABNORMAL LOW (ref 8.9–10.3)
Chloride: 105 mmol/L (ref 98–111)
Creatinine, Ser: 2.53 mg/dL — ABNORMAL HIGH (ref 0.44–1.00)
GFR, Estimated: 20 mL/min — ABNORMAL LOW (ref >60)
Glucose, Bld: 119 mg/dL — ABNORMAL HIGH (ref 70–99)
Potassium: 3.9 mmol/L (ref 3.5–5.1)
Sodium: 131 mmol/L — ABNORMAL LOW (ref 135–145)

## 2022-07-17 LAB — GLUCOSE, CAPILLARY
Glucose-Capillary: 132 mg/dL — ABNORMAL HIGH (ref 70–99)
Glucose-Capillary: 169 mg/dL — ABNORMAL HIGH (ref 70–99)
Glucose-Capillary: 123 mg/dL — ABNORMAL HIGH (ref 70–99)
Glucose-Capillary: 130 mg/dL — ABNORMAL HIGH (ref 70–99)

## 2022-07-17 LAB — LACTIC ACID, PLASMA: Lactic Acid, Venous: 1.3 mmol/L (ref 0.5–1.9)

## 2022-07-17 MED ORDER — ORAL CARE MOUTH RINSE: 15.0000 mL | OROMUCOSAL | Status: DC | PRN

## 2022-07-17 MED ORDER — INSULIN ASPART 100 UNIT/ML IJ SOLN
0.0000 [IU] | Freq: Three times a day (TID) | INTRAMUSCULAR | Status: DC
  Administered 2022-07-17: 3 [IU] via SUBCUTANEOUS
  Administered 2022-07-17: 2 [IU] via SUBCUTANEOUS
  Administered 2022-07-18: 5 [IU] via SUBCUTANEOUS
  Administered 2022-07-18: 2 [IU] via SUBCUTANEOUS
  Administered 2022-07-19 (×2): 5 [IU] via SUBCUTANEOUS
  Administered 2022-07-20: 3 [IU] via SUBCUTANEOUS

## 2022-07-17 NOTE — Progress Notes (Signed)
Patient complaints of nausea continues. Provided PRN medications for complaints and notified provider.   07/17/22 1401  Vitals  Temp 100.1 F (37.8 C)  Temp Source Oral  BP (!) 154/61  MAP (mmHg) 89  BP Method Automatic  Pulse Rate 87  Pulse Rate Source Monitor  Resp 20  MEWS COLOR  MEWS Score Color Green  Oxygen Therapy  SpO2 96 %  MEWS Score  MEWS Temp 0  MEWS Systolic 0  MEWS Pulse 0  MEWS RR 0  MEWS LOC 0  MEWS Score 0

## 2022-07-17 NOTE — Progress Notes (Addendum)
Laboratory called for critical result of hemoglobin 6.1, No active bleeding seen. Denies abdominal pain. Patient looks pale, tired and weak but not in distress. On call MD made aware.   Addendum: On call MD ordered repeat hemoglobin and hematocrit in the morning.

## 2022-07-17 NOTE — Progress Notes (Addendum)
Notified that patient has become febrile. She has a cough and CXR was already ordered for possible PNA but not yet performed. Plan to obtain blood cultures, check CBC and lactate, and follow-up on CXR findings.   ADDENDUM: Hgb came back at 6.1. No signs of bleeding, lab error seems most likely, will repeat.

## 2022-07-17 NOTE — Progress Notes (Signed)
PROGRESS NOTE   Tracey Bryant  QMG:[id number] DOB: Oct 31, 1951 DOA: 07/13/2022 PCP: Coolidge Breeze, FNP  Brief Narrative:  71 year old white female-Jehovah's Witness refuses blood Known A-fib CHADVASC >4 on Eliquis, HFrEF EF 25-30% + PPM, HTN, DM TY 2, cholecystitis status post cholecystotomy 02/2021 CKD 3 AA left popliteal DVT 03/2021 with history of CVAs at the same time Has a history of recurrent syncope--previously evaluated by neuro  Syncopal episodes of generalized worsening fatigue multiple episodes of near syncope with exertion-patient felt like she was getting off the couch Brought in for syncopal episode to [facility] 07/13/2022 blood pressure 202/97, CBG 196 Emergency room she was found to be bradycardic Because of shaking spells possible seizure--Teleme neuro was consulted and they loaded her with Keppra EEG was obtained She was also found to be in A-fib with started on Cardizem gtt.  Sodium 135 potassium 4.0 CO2 19 BUN/creatinine 89/3.0 lipase 102 WBC 12.5 platelet 423 CXR no active disease CT head multiple intracranial old infarcts in right cerebellar hemisphere with atrophy no acute intracranial findings on the non-Contrast CT  Patient was transferred to [facility] for continuous EEG Ultimately it was felt that she had nonepileptic events psychiatry was consulted and they cleared her  Hospital-Problem based course  Seizure versus other 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 Cont EEG and MRI neg for pathology--Keppra D/c Per Neuro Psychiatry has been consulted-- has been cleared from their perspective A-fib CHADVASC >4 on Eliquis HFrEF EF 25-30% with pacemaker Continue Coreg 3.125 twice daily, continue Eliquis 5 twice daily Can continue Imdur 15 daily Has paced rhythm on monitor tendency to hypotension Midodrine has been held at this time-1-2 episodes of low blood pressures but otherwise has been stable Diabetes mellitus type 2 Long-acting 15  units held at this time continue sliding scale 3 times daily with meals Sugars ranging 1 32-2 22 Prior DVT April 2022 and CVAs at the same time Continue Eliquis as above CKD 4 Etiology probably underlying diabetes and HTN with poor forward flow GFR is close to 15 and will need outpatient discussion with renal Chr biliary drain 02/2021 Patient states that surgeon has told her she was never really been operative candidate-this will probably need to have continued attention  DVT prophylaxis: Eliquis Code Status: Full Family Communication: D/W patient's son 7/20 Tracey Bryant [phone] and explained plan of care Disposition:  Status is: Observation The patient will require care spanning > 2 midnights and should be moved to inpatient because:   Awaiting skilled placement has been cleared for DC   Consultants:  Neurology  Procedures: Continuous EEG  Antimicrobials: None   Subjective:  Has had several episodes of shaking spells but is awake and coherent throughout Experienced 1 episode of emesis this morning and is having crackers no chest pain no fever--she has occasional nausea at home  Objective: Vitals:   07/16/22 1522 07/16/22 1959 07/16/22 2351 07/17/22 0325  BP: (!) 121/42 (!) 132/46 (!) 95/50 (!) 132/54  Pulse: 60 65 60 62  Resp:  '19 18 20  '$ Temp: 98.3 F (36.8 C) 99.1 F (37.3 C) 99.2 F (37.3 C) 99.4 F (37.4 C)  TempSrc: Oral Oral Oral Oral  SpO2: 97% 97% 97% 97%  Weight:      Height:        Intake/Output Summary (Last 24 hours) at 07/17/2022 0711 Last data filed at 07/17/2022 9983 Gross per 24 hour  Intake 1087.43 ml  Output 1950 ml  Net -862.57 ml  Filed Weights   07/13/22 0935 07/14/22 0409  Weight: 68 kg 71.4 kg    Examination:  Awake coherent no distress EOMI NCAT no focal deficit chest is clear Abdomen is soft no rebound no guarding RRR menance intact CTA B no added sound  Data Reviewed: personally reviewed   CBC    Component Value  Date/Time   WBC 10.5 07/14/2022 0617   RBC 3.99 07/14/2022 0617   HGB 11.3 (L) 07/14/2022 0617   HGB 11.9 06/15/2022 0938   HCT 35.5 (L) 07/14/2022 0617   HCT 37.7 06/15/2022 0938   PLT 404 (H) 07/14/2022 0617   PLT 424 06/15/2022 0938   MCV 89.0 07/14/2022 0617   MCV 89 06/15/2022 0938   MCH 28.3 07/14/2022 0617   MCHC 31.8 07/14/2022 0617   RDW 16.3 (H) 07/14/2022 0617   RDW 14.7 06/15/2022 0938   LYMPHSABS 2.4 07/13/2022 1116   MONOABS 0.8 07/13/2022 1116   EOSABS 0.9 (H) 07/13/2022 1116   BASOSABS 0.1 07/13/2022 1116      Latest Ref Rng & Units 07/16/2022    5:41 AM 07/15/2022    8:56 AM 07/14/2022    6:17 AM  CMP  Glucose 70 - 99 mg/dL 110  90  107   BUN 8 - 23 mg/dL 63  70  86   Creatinine 0.44 - 1.00 mg/dL 2.71  2.72  3.15   Sodium 135 - 145 mmol/L 138  140  138   Potassium 3.5 - 5.1 mmol/L 4.2  4.4  4.1   Chloride 98 - 111 mmol/L 108  108  107   CO2 22 - 32 mmol/L '21  18  24   '$ Calcium 8.9 - 10.3 mg/dL 8.8  8.7  8.7   Total Protein 6.5 - 8.1 g/dL 7.6  7.2  7.9   Total Bilirubin 0.3 - 1.2 mg/dL 0.9  1.0  0.8   Alkaline Phos 38 - 126 U/L 55  51  59   AST 15 - 41 U/L '20  20  23   '$ ALT 0 - 44 U/L '13  14  14      '$ Radiology Studies: MR BRAIN WO CONTRAST  Result Date: 07/16/2022 CLINICAL DATA:  Seizure, new onset EXAM: MRI HEAD WITHOUT CONTRAST TECHNIQUE: Multiplanar, multiecho pulse sequences of the brain and surrounding structures were obtained without intravenous contrast. COMPARISON:  05/25/2022 MRI FINDINGS: Brain: No restricted diffusion to suggest acute or subacute infarct. No acute hemorrhage, mass, mass effect, or midline shift. No hydrocephalus or extra-axial collection. Redemonstrated chronic infarcts in the inferior right frontal, right parietal, right posterior temporal, and right occipital lobes. Scattered T2 hyperintense signal in the periventricular white matter, likely the sequela of chronic small vessel ischemic disease. Hemosiderin deposition along the  medial left temporal lobe, likely sequela of prior microhemorrhage. Hemosiderin deposition is also associated with the right temporal lobe infarct. Vascular: Normal arterial flow voids. Skull and upper cervical spine: Normal marrow signal. Sinuses/Orbits: Mucous retention cysts in the right maxillary sinus. Mucosal thickening in the ethmoid air cells. The orbits are unremarkable. Other: Fluid in the bilateral mastoid air cells. IMPRESSION: No acute intracranial process. No evidence of acute or subacute infarct. Electronically Signed   By: Merilyn Baba M.D.   On: 07/16/2022 14:06   Overnight EEG with video  Result Date: 07/15/2022 Lora Havens, MD     07/16/2022  8:52 AM Patient Name: Ryliegh D Gowan MRN: [id number] Epilepsy Attending: Lora Havens Referring Physician/Provider: Donnetta Simpers, MD Duration:  07/15/2022 0336 to 07/16/2022 4128  Patient history: 71 yo woman with pmhx a fib on eliquis, DM2, HTN, prior hx CVA with residual R sided weakness admitted after syncopal episode. Neurology received urgent teleconsult for this patient for possible seizure activity. EEG to evaluate for seizure.  Level of alertness: Awake, asleep  AEDs during EEG study: LEV  Technical aspects: This EEG study was done with scalp electrodes positioned according to the 10-20 International system of electrode placement. Electrical activity was acquired at a sampling rate of '500Hz'$  and reviewed with a high frequency filter of '70Hz'$  and a low frequency filter of '1Hz'$ . EEG data were recorded continuously and digitally stored.  Description: The posterior dominant rhythm consists of 8-9 Hz activity of moderate voltage (25-35 uV) seen predominantly in posterior head regions, symmetric and reactive to eye opening and eye closing. Sleep was characterized by vertex waves, sleep spindles (12 to 14 Hz), maximal frontocentral region. Hyperventilation and photic stimulation were not performed.   One event was recorded on 07/15/2022 at 0908  during which examiner asked patient to raise her arm and she had whole body violent jerking which lasted for 6 to 7 seconds. Concomitant EEG before, during and after the event did not show any EEG change to suggest seizure. One event was recorded on 07/15/2022 at 1336 during which she had whole body violent jerking which lasted for 3-4 seconds. Concomitant EEG before, during and after the event did not show any EEG change to suggest seizure. One event was recorded on 07/15/2022 at 1343 during which she had whole body jerking and side-to-side head movement which lasted for 5 to 7 seconds.  She was working with physical therapy who then helped her sit up at the edge of the bed.  While sitting up, she had side-to-side movement of her head as well as jerking.  Concomitant EEG before, during and after the event did not show any EEG change to suggest seizure.  IMPRESSION: This study is within normal limits. No seizures or epileptiform discharges were seen throughout the recording. Three events were recorded on 07/15/2022 as described above without concomitant EEG change.  These events were NON-epileptic.  Priyanka Barbra Sarks     Scheduled Meds:  apixaban  5 mg Oral BID   carvedilol  3.125 mg Oral BID WC   Chlorhexidine Gluconate Cloth  6 each Topical Daily   hydrALAZINE  50 mg Oral Q8H   insulin aspart  0-15 Units Subcutaneous Q4H   isosorbide mononitrate  15 mg Oral Daily   Continuous Infusions:  lactated ringers 75 mL/hr at 07/17/22 0332     LOS: 3 days   Time spent: [place], MD [facility] To contact the attending provider between 7A-7P or the covering provider during after hours 7P-7A, please log into the web site [email or web address] and access using universal [place] password for that web site. If you do not have the password, please call the hospital operator.  07/17/2022, 7:11 AM

## 2022-07-18 ENCOUNTER — Inpatient Hospital Stay (HOSPITAL_COMMUNITY): Payer: Medicare HMO

## 2022-07-18 DIAGNOSIS — R55 Syncope and collapse: Secondary | ICD-10-CM | POA: Diagnosis not present

## 2022-07-18 LAB — GLUCOSE, CAPILLARY
Glucose-Capillary: 119 mg/dL — ABNORMAL HIGH (ref 70–99)
Glucose-Capillary: 118 mg/dL — ABNORMAL HIGH (ref 70–99)
Glucose-Capillary: 134 mg/dL — ABNORMAL HIGH (ref 70–99)
Glucose-Capillary: 204 mg/dL — ABNORMAL HIGH (ref 70–99)
Glucose-Capillary: 149 mg/dL — ABNORMAL HIGH (ref 70–99)

## 2022-07-18 LAB — COMPREHENSIVE METABOLIC PANEL
ALT: 33 U/L (ref 0–44)
AST: 42 U/L — ABNORMAL HIGH (ref 15–41)
Albumin: 2.4 g/dL — ABNORMAL LOW (ref 3.5–5.0)
Alkaline Phosphatase: 73 U/L (ref 38–126)
Anion gap: 8 (ref 5–15)
BUN: 46 mg/dL — ABNORMAL HIGH (ref 8–23)
CO2: 20 mmol/L — ABNORMAL LOW (ref 22–32)
Calcium: 8.1 mg/dL — ABNORMAL LOW (ref 8.9–10.3)
Chloride: 106 mmol/L (ref 98–111)
Creatinine, Ser: 2.97 mg/dL — ABNORMAL HIGH (ref 0.44–1.00)
GFR, Estimated: 16 mL/min — ABNORMAL LOW (ref >60)
Glucose, Bld: 120 mg/dL — ABNORMAL HIGH (ref 70–99)
Potassium: 4.4 mmol/L (ref 3.5–5.1)
Sodium: 134 mmol/L — ABNORMAL LOW (ref 135–145)
Total Bilirubin: 0.4 mg/dL (ref 0.3–1.2)
Total Protein: 6.5 g/dL (ref 6.5–8.1)

## 2022-07-18 LAB — HEMOGLOBIN AND HEMATOCRIT, BLOOD
HCT: 27.4 % — ABNORMAL LOW (ref 36.0–46.0)
Hemoglobin: 9.2 g/dL — ABNORMAL LOW (ref 12.0–15.0)

## 2022-07-18 NOTE — Progress Notes (Signed)
PROGRESS NOTE   Tracey Bryant  GXQ:[id number] DOB: 1951-01-29 DOA: 07/13/2022 PCP: Coolidge Breeze, FNP  Brief Narrative:  71 year old white female-Jehovah's Witness refuses blood Known A-fib CHADVASC >4 on Eliquis, HFrEF EF 25-30% + PPM, HTN, DM TY 2, cholecystitis status post cholecystotomy 02/2021 CKD 3 AA left popliteal DVT 03/2021 with history of CVAs at the same time Has a history of recurrent syncope--previously evaluated by neuro  Syncopal episodes of generalized worsening fatigue multiple episodes of near syncope with exertion-patient felt like she was getting off the couch Brought in for syncopal episode to [facility] 07/13/2022 blood pressure 202/97, CBG 196 Emergency room she was found to be bradycardic Because of shaking spells possible seizure--Teleme neuro was consulted and they loaded her with Keppra EEG was obtained She was also found to be in A-fib with started on Cardizem gtt.  Sodium 135 potassium 4.0 CO2 19 BUN/creatinine 89/3.0 lipase 102 WBC 12.5 platelet 423 CXR no active disease CT head multiple intracranial old infarcts in right cerebellar hemisphere with atrophy no acute intracranial findings on the non-Contrast CT  Patient was transferred to [facility] for continuous EEG Ultimately it was felt that she had nonepileptic events psychiatry was consulted and they cleared her  Developed a low-grade fever 7/72  Hospital-Problem based course  Low-grade fever secondary to pneumonitis no pneumonia Chest x-ray shows more cardiomegaly than actual pneumonia-she had been coughing on 7/22 Overall she feels a little better today we will hold antibiotics but can follow blood cultures Seizure versus other 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 and seems to occur when she is active Cont EEG and MRI neg for pathology--Keppra D/c Per Neuro Psychiatry has been consulted-- has been cleared from their perspective A-fib CHADVASC >4 on Eliquis HFrEF EF  25-30% with pacemaker Continue Coreg 3.125 twice daily, continue Eliquis 5 twice daily Can continue Imdur 15 daily Has paced rhythm on monitor tendency to hypotension Midodrine  held at this time-1-2 episodes of low blood pressures but otherwise has been stable Diabetes mellitus type 2 Long-acting 15 units held at this time continue sliding scale 3 times daily with meals Sugars ranging 100-1 50 Prior DVT April 2022 and CVAs at the same time Continue Eliquis as above CKD 4 Etiology probably underlying diabetes and HTN with poor forward flow GFR is close to 15 and will need outpatient discussion with renal Chr biliary drain 02/2021 Patient states that surgeon has told her she was never really been operative candidate-this will probably need to have continued attention  DVT prophylaxis: Eliquis Code Status: Full Family Communication: called  7/23 Maud Deed [phone] --couldn't get him nor leave VM Disposition:  Status is: Observation The patient will require care spanning > 2 midnights and should be moved to inpatient because:   Awaiting skilled placement has been cleared for DC   Consultants:  Neurology  Procedures: Continuous EEG  Antimicrobials: None   Subjective:  Cough resolved-nausea better Eatign some now No cp No abd pain No high grade fever  Objective: Vitals:   07/17/22 1958 07/17/22 2331 07/18/22 0401 07/18/22 0805  BP: (!) 128/54 (!) 114/49 (!) 126/51 (!) 132/58  Pulse: 66 63 63 61  Resp: '13 20 17   '$ Temp: (!) 100.4 F (38 C) 99.6 F (37.6 C) 100.1 F (37.8 C) 99.9 F (37.7 C)  TempSrc: Oral Oral Oral Oral  SpO2: 96% 96% 96% 95%  Weight:      Height:        Intake/Output Summary (  Last 24 hours) at 07/18/2022 0808 Last data filed at 07/18/2022 0545 Gross per 24 hour  Intake 467.62 ml  Output 1150 ml  Net -682.38 ml    Filed Weights   07/13/22 0935 07/14/22 0409  Weight: 68 kg 71.4 kg    Examination:  Affect is improved Chest is  clear no added sound no rales no rhonchi no wheeze Abdomen is soft no rebound no guarding ROM is intact Neurologically is intact but does have episodes where she shakes all over and is completely conscious during exam   Data Reviewed: personally reviewed   CBC    Component Value Date/Time   WBC 7.2 07/17/2022 2136   RBC 2.11 (L) 07/17/2022 2136   HGB 9.2 (L) 07/18/2022 0407   HGB 11.9 06/15/2022 0938   HCT 27.4 (L) 07/18/2022 0407   HCT 37.7 06/15/2022 0938   PLT 301 07/17/2022 2136   PLT 424 06/15/2022 0938   MCV 86.7 07/17/2022 2136   MCV 89 06/15/2022 0938   MCH 28.9 07/17/2022 2136   MCHC 33.3 07/17/2022 2136   RDW 17.0 (H) 07/17/2022 2136   RDW 14.7 06/15/2022 0938   LYMPHSABS 2.4 07/13/2022 1116   MONOABS 0.8 07/13/2022 1116   EOSABS 0.9 (H) 07/13/2022 1116   BASOSABS 0.1 07/13/2022 1116      Latest Ref Rng & Units 07/18/2022    4:07 AM 07/17/2022    6:32 AM 07/16/2022    5:41 AM  CMP  Glucose 70 - 99 mg/dL 120  119  110   BUN 8 - 23 mg/dL 46  49  63   Creatinine 0.44 - 1.00 mg/dL 2.97  2.53  2.71   Sodium 135 - 145 mmol/L 134  131  138   Potassium 3.5 - 5.1 mmol/L 4.4  3.9  4.2   Chloride 98 - 111 mmol/L 106  105  108   CO2 22 - 32 mmol/L '20  19  21   '$ Calcium 8.9 - 10.3 mg/dL 8.1  8.1  8.8   Total Protein 6.5 - 8.1 g/dL 6.5   7.6   Total Bilirubin 0.3 - 1.2 mg/dL 0.4   0.9   Alkaline Phos 38 - 126 U/L 73   55   AST 15 - 41 U/L 42   20   ALT 0 - 44 U/L 33   13      Radiology Studies: DG CHEST PORT 1 VIEW  Result Date: 07/18/2022 CLINICAL DATA:  Shortness of breath. EXAM: PORTABLE CHEST 1 VIEW COMPARISON:  One-view chest x-ray 07/13/2022 FINDINGS: The heart is enlarged. Pacing wires are stable. Mild pulmonary vascular congestion is present, the overall interstitial pattern is slightly increased from the prior exam. No focal airspace consolidation is present. No pneumothorax or significant effusion. Visualized soft tissues and bony thorax are unremarkable.  IMPRESSION: 1. Stable cardiomegaly with slight increase in mild pulmonary vascular congestion. 2. No focal airspace disease. Electronically Signed   By: San Morelle M.D.   On: 07/18/2022 06:10   MR BRAIN WO CONTRAST  Result Date: 07/16/2022 CLINICAL DATA:  Seizure, new onset EXAM: MRI HEAD WITHOUT CONTRAST TECHNIQUE: Multiplanar, multiecho pulse sequences of the brain and surrounding structures were obtained without intravenous contrast. COMPARISON:  05/25/2022 MRI FINDINGS: Brain: No restricted diffusion to suggest acute or subacute infarct. No acute hemorrhage, mass, mass effect, or midline shift. No hydrocephalus or extra-axial collection. Redemonstrated chronic infarcts in the inferior right frontal, right parietal, right posterior temporal, and right occipital lobes. Scattered T2  hyperintense signal in the periventricular white matter, likely the sequela of chronic small vessel ischemic disease. Hemosiderin deposition along the medial left temporal lobe, likely sequela of prior microhemorrhage. Hemosiderin deposition is also associated with the right temporal lobe infarct. Vascular: Normal arterial flow voids. Skull and upper cervical spine: Normal marrow signal. Sinuses/Orbits: Mucous retention cysts in the right maxillary sinus. Mucosal thickening in the ethmoid air cells. The orbits are unremarkable. Other: Fluid in the bilateral mastoid air cells. IMPRESSION: No acute intracranial process. No evidence of acute or subacute infarct. Electronically Signed   By: Merilyn Baba M.D.   On: 07/16/2022 14:06     Scheduled Meds:  apixaban  5 mg Oral BID   carvedilol  3.125 mg Oral BID WC   Chlorhexidine Gluconate Cloth  6 each Topical Daily   hydrALAZINE  50 mg Oral Q8H   insulin aspart  0-15 Units Subcutaneous TID WC   isosorbide mononitrate  15 mg Oral Daily   Continuous Infusions:  lactated ringers 75 mL/hr at 07/18/22 0735     LOS: 4 days   Time spent: [place],  MD [facility] To contact the attending provider between 7A-7P or the covering provider during after hours 7P-7A, please log into the web site [email or web address] and access using universal [place] password for that web site. If you do not have the password, please call the hospital operator.  07/18/2022, 8:08 AM

## 2022-07-19 DIAGNOSIS — R55 Syncope and collapse: Secondary | ICD-10-CM | POA: Diagnosis not present

## 2022-07-19 LAB — GLUCOSE, CAPILLARY
Glucose-Capillary: 94 mg/dL (ref 70–99)
Glucose-Capillary: 206 mg/dL — ABNORMAL HIGH (ref 70–99)
Glucose-Capillary: 214 mg/dL — ABNORMAL HIGH (ref 70–99)
Glucose-Capillary: 128 mg/dL — ABNORMAL HIGH (ref 70–99)

## 2022-07-19 LAB — COMPREHENSIVE METABOLIC PANEL
ALT: 24 U/L (ref 0–44)
AST: 31 U/L (ref 15–41)
Albumin: 2.1 g/dL — ABNORMAL LOW (ref 3.5–5.0)
Alkaline Phosphatase: 63 U/L (ref 38–126)
Anion gap: 8 (ref 5–15)
BUN: 40 mg/dL — ABNORMAL HIGH (ref 8–23)
CO2: 18 mmol/L — ABNORMAL LOW (ref 22–32)
Calcium: 7.6 mg/dL — ABNORMAL LOW (ref 8.9–10.3)
Chloride: 108 mmol/L (ref 98–111)
Creatinine, Ser: 2.59 mg/dL — ABNORMAL HIGH (ref 0.44–1.00)
GFR, Estimated: 19 mL/min — ABNORMAL LOW (ref >60)
Glucose, Bld: 101 mg/dL — ABNORMAL HIGH (ref 70–99)
Potassium: 4.3 mmol/L (ref 3.5–5.1)
Sodium: 134 mmol/L — ABNORMAL LOW (ref 135–145)
Total Bilirubin: 0.3 mg/dL (ref 0.3–1.2)
Total Protein: 5.7 g/dL — ABNORMAL LOW (ref 6.5–8.1)

## 2022-07-19 LAB — CBC
HCT: 27.4 % — ABNORMAL LOW (ref 36.0–46.0)
Hemoglobin: 9 g/dL — ABNORMAL LOW (ref 12.0–15.0)
MCH: 28.7 pg (ref 26.0–34.0)
MCHC: 32.8 g/dL (ref 30.0–36.0)
MCV: 87.3 fL (ref 80.0–100.0)
Platelets: 214 10*3/uL (ref 150–400)
RBC: 3.14 MIL/uL — ABNORMAL LOW (ref 3.87–5.11)
RDW: 17.3 % — ABNORMAL HIGH (ref 11.5–15.5)
WBC: 4.6 10*3/uL (ref 4.0–10.5)
nRBC: 0 % (ref 0.0–0.2)

## 2022-07-19 NOTE — Progress Notes (Signed)
PROGRESS NOTE   Tracey Bryant  GEX:[id number] DOB: July 23, 1951 DOA: 07/13/2022 PCP: Coolidge Breeze, FNP  Brief Narrative:  71 year old white female-Jehovah's Witness refuses blood Known A-fib CHADVASC >4 on Eliquis, HFrEF EF 25-30% + PPM, HTN, DM TY 2, cholecystitis status post cholecystotomy 02/2021 CKD 3 AA left popliteal DVT 03/2021 with history of CVAs at the same time Has a history of recurrent syncope--previously evaluated by neuro  Syncopal episodes of generalized worsening fatigue multiple episodes of near syncope with exertion-patient felt like she was getting off the couch Brought in for syncopal episode to [facility] 07/13/2022 blood pressure 202/97, CBG 196 Emergency room she was found to be bradycardic Because of shaking spells possible seizure--Teleme neuro was consulted and they loaded her with Keppra EEG was obtained She was also found to be in A-fib with started on Cardizem gtt.  Sodium 135 potassium 4.0 CO2 19 BUN/creatinine 89/3.0 lipase 102 WBC 12.5 platelet 423 CXR no active disease CT head multiple intracranial old infarcts in right cerebellar hemisphere with atrophy no acute intracranial findings on the non-Contrast CT  Patient was transferred to [facility] for continuous EEG Ultimately it was felt that she had nonepileptic events psychiatry was consulted and they cleared her  Developed a low-grade fever 7/72  Hospital-Problem based course  Low-grade fever secondary to pneumonitis no pneumonia Denies further cough wheeze fever or low-grade temp, blood culture negative X 2 days Seizure versus other 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 and seems to occur when she is active--I explained this briefly to her and she will need skilled placement Cont EEG and MRI neg for pathology--Keppra D/c Per Neuro Psychiatry has been consulted-- has been cleared from their perspective A-fib CHADVASC >4 on Eliquis HFrEF EF 25-30% with pacemaker Continue  Coreg 3.125 twice daily, continue Eliquis 5 twice daily Can continue Imdur 15 daily Has paced rhythm on monitor and can discontinue monitors tendency to hypotension Midodrine  held at this time-1-2 episodes of low blood pressures but otherwise has been stable Unless MAP persistently below 65 would discontinue this medication Diabetes mellitus type 2 Long-acting 15 units held at this time continue sliding scale 3 times daily with meals Sugars ranging 90s to 200 Prior DVT April 2022 and CVAs at the same time Continue Eliquis as above CKD 4 Etiology probably underlying diabetes and HTN with poor forward flow GFR is close to 15 and will need outpatient discussion with renal Chr biliary drain 02/2021 Patient states that surgeon has told her she was never really been operative candidate-this will probably need to have continued attention  DVT prophylaxis: Eliquis Code Status: Full Family Communication: called  7/23 Maud Deed [phone] --couldn't get him nor leave VM Disposition:  Status is: Observation The patient will require care spanning > 2 midnights and should be moved to inpatient because:   Awaiting skilled placement has been cleared for DC   Consultants:  Neurology  Procedures: Continuous EEG  Antimicrobials: None   Subjective:  Overall improved Eating drinking no abdominal pain no nausea no vomiting No chest pain  Objective: Vitals:   07/19/22 0000 07/19/22 0400 07/19/22 0837 07/19/22 1141  BP: 135/65 (!) 123/54 134/61 (!) 120/51  Pulse: 75  60 68  Resp: '16 16 18 18  '$ Temp: 98.5 F (36.9 C) 98.5 F (36.9 C) 99 F (37.2 C) 97.8 F (36.6 C)  TempSrc: Oral Oral Oral Oral  SpO2: 95% 95% 95% 98%  Weight:      Height:  Intake/Output Summary (Last 24 hours) at 07/19/2022 1607 Last data filed at 07/19/2022 1300 Gross per 24 hour  Intake --  Output 1300 ml  Net -1300 ml    Filed Weights   07/13/22 0935 07/14/22 0409  Weight: 68 kg 71.4 kg     Examination:  No distress EOMI NCAT no focal deficit ROM intact CTA B no added sound no rales rhonchi wheeze Moving 4 limbs equally Some brief jerking spells but otherwise has been stable  Data Reviewed: personally reviewed   CBC    Component Value Date/Time   WBC 4.6 07/19/2022 0159   RBC 3.14 (L) 07/19/2022 0159   HGB 9.0 (L) 07/19/2022 0159   HGB 11.9 06/15/2022 0938   HCT 27.4 (L) 07/19/2022 0159   HCT 37.7 06/15/2022 0938   PLT 214 07/19/2022 0159   PLT 424 06/15/2022 0938   MCV 87.3 07/19/2022 0159   MCV 89 06/15/2022 0938   MCH 28.7 07/19/2022 0159   MCHC 32.8 07/19/2022 0159   RDW 17.3 (H) 07/19/2022 0159   RDW 14.7 06/15/2022 0938   LYMPHSABS 2.4 07/13/2022 1116   MONOABS 0.8 07/13/2022 1116   EOSABS 0.9 (H) 07/13/2022 1116   BASOSABS 0.1 07/13/2022 1116      Latest Ref Rng & Units 07/19/2022    1:59 AM 07/18/2022    4:07 AM 07/17/2022    6:32 AM  CMP  Glucose 70 - 99 mg/dL 101  120  119   BUN 8 - 23 mg/dL 40  46  49   Creatinine 0.44 - 1.00 mg/dL 2.59  2.97  2.53   Sodium 135 - 145 mmol/L 134  134  131   Potassium 3.5 - 5.1 mmol/L 4.3  4.4  3.9   Chloride 98 - 111 mmol/L 108  106  105   CO2 22 - 32 mmol/L '18  20  19   '$ Calcium 8.9 - 10.3 mg/dL 7.6  8.1  8.1   Total Protein 6.5 - 8.1 g/dL 5.7  6.5    Total Bilirubin 0.3 - 1.2 mg/dL 0.3  0.4    Alkaline Phos 38 - 126 U/L 63  73    AST 15 - 41 U/L 31  42    ALT 0 - 44 U/L 24  33       Radiology Studies: DG CHEST PORT 1 VIEW  Result Date: 07/18/2022 CLINICAL DATA:  Shortness of breath. EXAM: PORTABLE CHEST 1 VIEW COMPARISON:  One-view chest x-ray 07/13/2022 FINDINGS: The heart is enlarged. Pacing wires are stable. Mild pulmonary vascular congestion is present, the overall interstitial pattern is slightly increased from the prior exam. No focal airspace consolidation is present. No pneumothorax or significant effusion. Visualized soft tissues and bony thorax are unremarkable. IMPRESSION: 1. Stable  cardiomegaly with slight increase in mild pulmonary vascular congestion. 2. No focal airspace disease. Electronically Signed   By: San Morelle M.D.   On: 07/18/2022 06:10     Scheduled Meds:  apixaban  5 mg Oral BID   carvedilol  3.125 mg Oral BID WC   hydrALAZINE  50 mg Oral Q8H   insulin aspart  0-15 Units Subcutaneous TID WC   isosorbide mononitrate  15 mg Oral Daily   Continuous Infusions:  lactated ringers 75 mL/hr at 07/19/22 0805     LOS: 5 days   Time spent: [place], MD [facility] To contact the attending provider between 7A-7P or the covering provider during after hours 7P-7A, please log into  the web site [email or web address] and access using universal [place] password for that web site. If you do not have the password, please call the hospital operator.  07/19/2022, 4:07 PM

## 2022-07-19 NOTE — TOC Progression Note (Signed)
Transition of Care [facility]) - Progression Note    Patient Details  Name: Tracey Bryant MRN: [id number] Date of Birth: [birth year]  Transition of Care [facility]) CM/SW [place], [place] Phone Number: 07/19/2022, 4:17 PM  Clinical Narrative:   CSW received update from [place] this morning that they thought they took patient's insurance, but they do not; unable to offer a bed. CSW attempted to reach [facility], the other SNF that the patient wants for rehab, but unable to reach admissions; left a voicemail earlier and attempted to call back multiple times with no answer. CSW called back again, spoke with secretary who said Admissions had gone for the day. Secretary at [facility] took [place] name and number and placed on Admissions desk for them to call back in the morning. CSW to follow.    Expected Discharge Plan: Skilled Nursing Facility Barriers to Discharge: Continued Medical Work up, Ship broker  Expected Discharge Plan and Services Expected Discharge Plan: [place]                                               Social Determinants of Health (SDOH) Interventions    Readmission Risk Interventions    12/27/2021    2:32 PM 03/19/2021    9:34 AM 03/17/2021   11:56 AM  Readmission Risk Prevention Plan  Transportation Screening Complete  Complete  PCP or Specialist Appt within 5-7 Days  Complete   PCP or Specialist Appt within 3-5 Days Complete    Home Care Screening  Complete   Medication Review (RN CM)   Complete  HRI or Home Care Consult Complete    Social Work Consult for [place] Planning/Counseling Complete    Palliative Care Screening Not Applicable    Medication Review Press photographer) Complete

## 2022-07-19 NOTE — Progress Notes (Signed)
Physical Therapy Treatment Patient Details Name: Tracey Bryant MRN: [id number] DOB: March 10, 1951 Today's Date: 07/19/2022   History of Present Illness 71 yo female presenting to [facility] on 7/18 after passing out at home. Transfer to [facility] on 7/19 for neuro eval. EEG showing no seizures. CT head normal and no acute changes. Awaiting MRI. PMH including type 2 diabetes, hypertension, multiple CVAs with residual right-sided weakness, paroxysmal atrial fibrillation and history of DVT on Eliquis, CKD stage IV, and diastolic CHF.    PT Comments    Pt progressing towards physical therapy goals. Was able to perform transfers with gross +2 mod assist and RW for support. Pt with several shaking episodes throughout session, which appear to be intentional movements. Pt upset, reports "I don't know what's wrong with me" and very fearful of falling. Frequency of shaking episodes appear to decrease with empathy and gentle encouragement. Discharge recommendations remain appropriate. Will continue to follow and progress as able per POC.     Recommendations for follow up therapy are one component of a multi-disciplinary discharge planning process, led by the attending physician.  Recommendations may be updated based on patient status, additional functional criteria and insurance authorization.  Follow Up Recommendations  Skilled nursing-short term rehab (<3 hours/day) Can patient physically be transported by private vehicle: No   Assistance Recommended at Discharge Intermittent Supervision/Assistance  Patient can return home with the following A little help with walking and/or transfers;Help with stairs or ramp for entrance;Assist for transportation;Assistance with cooking/housework   Equipment Recommendations  None recommended by PT    Recommendations for Other Services       Precautions / Restrictions Precautions Precautions: Fall Restrictions Weight Bearing Restrictions: No     Mobility  Bed  Mobility Overal bed mobility: Needs Assistance Bed Mobility: Supine to Sit     Supine to sit: Mod assist     General bed mobility comments: Grossly mod assist with increased support provided during shaking episodes with initiation of movement. Shaking appears intentional however pt reports she cannot help it. Frequency of shaking episodes decreased with empathy and encouragement from therapist.    Transfers Overall transfer level: Needs assistance Equipment used: Rolling walker (2 wheels) Transfers: Sit to/from Stand, Bed to chair/wheelchair/BSC Sit to Stand: Mod assist, +2 physical assistance   Step pivot transfers: Mod assist, +2 physical assistance       General transfer comment: +2 assist for power up to full stand. Once standing, pt with mild shaking episode, bringing walker in and out a few times before episode subsided. +2 assist mainly for safety as pt took pivotal steps around to the recliner.    Ambulation/Gait               General Gait Details: Unable to progress to gait training this session. Deferred for safety due to shaking episodes and pt's fear of falling.   Stairs             Wheelchair Mobility    Modified Rankin (Stroke Patients Only)       Balance Overall balance assessment: Needs assistance Sitting-balance support: No upper extremity supported, Feet supported Sitting balance-Leahy Scale: Poor Sitting balance - Comments: Poor sitting EOB and requires trunk stability.   Standing balance support: Bilateral upper extremity supported, During functional activity Standing balance-Leahy Scale: Poor                              Cognition Arousal/Alertness: Awake/alert Behavior  During Therapy: Flat affect Overall Cognitive Status: Difficult to assess                                 General Comments: Pt fearful of falling        Exercises      General Comments        Pertinent Vitals/Pain Pain  Assessment Pain Assessment: No/denies pain    Home Living                          Prior Function            PT Goals (current goals can now be found in the care plan section) Acute Rehab PT Goals Patient Stated Goal: return home PT Goal Formulation: With patient Time For Goal Achievement: 07/28/22 Potential to Achieve Goals: Fair Progress towards PT goals: Progressing toward goals    Frequency    Min 3X/week      PT Plan Current plan remains appropriate    Co-evaluation              AM-PAC PT "6 Clicks" Mobility   Outcome Measure  Help needed turning from your back to your side while in a flat bed without using bedrails?: A Little Help needed moving from lying on your back to sitting on the side of a flat bed without using bedrails?: A Lot Help needed moving to and from a bed to a chair (including a wheelchair)?: A Lot Help needed standing up from a chair using your arms (e.g., wheelchair or bedside chair)?: A Lot Help needed to walk in hospital room?: Total Help needed climbing 3-5 steps with a railing? : Total 6 Click Score: 11    End of Session Equipment Utilized During Treatment: Gait belt Activity Tolerance: Treatment limited secondary to medical complications (Comment) Patient left: in chair;with call bell/phone within reach;with chair alarm set Nurse Communication: Mobility status PT Visit Diagnosis: Unsteadiness on feet (R26.81);Other abnormalities of gait and mobility (R26.89);Muscle weakness (generalized) (M62.81)     Time: 4967-5916 PT Time Calculation (min) (ACUTE ONLY): 18 min  Charges:  $Gait Training: 8-22 mins                     Tracey Bryant, PT, DPT Acute Rehabilitation Services Secure Chat Preferred Office: [phone]    Tracey Bryant 07/19/2022, 10:06 AM

## 2022-07-20 DIAGNOSIS — R55 Syncope and collapse: Secondary | ICD-10-CM | POA: Diagnosis not present

## 2022-07-20 LAB — GLUCOSE, CAPILLARY
Glucose-Capillary: 115 mg/dL — ABNORMAL HIGH (ref 70–99)
Glucose-Capillary: 195 mg/dL — ABNORMAL HIGH (ref 70–99)

## 2022-07-20 MED ORDER — INSULIN GLARGINE-YFGN 100 UNIT/ML ~~LOC~~ SOLN: 10.0000 [IU] | Freq: Every day | SUBCUTANEOUS | 11 refills | Status: DC

## 2022-07-20 NOTE — Discharge Summary (Signed)
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--is stable from neuro perspective and this doesn't warrant any further work-up given negative EEG/MRI Should have re-evaluation of her chronic biliary drain for removal Suggest OP follow-up with neprhology  Discharge Diagnoses:  MAIN problem for hospitalization   Non epileptic events Fever with pneumonitis   Please see below for itemized issues addressed in [place]- refer to other progress notes for clarity if needed  Discharge Condition: improved  Diet recommendation:  hh low salt  Filed Weights   07/13/22 0935 07/14/22 0409  Weight: 68 kg 71.4 kg    History of present illness:  71 year old white female-[employer] refuses blood Known A-fib CHADVASC >4 on Eliquis, HFrEF EF 25-30% + PPM, HTN, DM TY 2, cholecystitis status post cholecystotomy 02/2021 CKD 3 AA left popliteal DVT 03/2021 with 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   Sodium 135 potassium 4.0 CO2 19 BUN/creatinine 89/3.0 lipase 102 WBC 12.5 platelet 423 CXR no active disease CT head multiple intracranial old infarcts in right cerebellar hemisphere with atrophy no  acute intracranial findings on the non-Contrast CT   Patient was transferred to [facility] for continuous EEG Ultimately it was felt that she had nonepileptic events psychiatry was consulted and they cleared her   Developed a low-grade fever 7/72  Hospital Course:  Low-grade fever secondary to pneumonitis no pneumonia Denies further cough wheeze fever or low-grade temp, blood culture negative X 2 days Seizure versus other 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 and seems to occur when she is active/ and or under stress--I explained this briefly to her and she will need skilled placement, and that there is no fruther work-up required Cont EEG and MRI neg for pathology--Keppra D/c Per Neuro Psychiatry has been consulted-- has been cleared from their perspective, no noted psychosocial stressors A-fib CHADVASC >4 on Eliquis HFrEF EF 25-30% with pacemaker Continue Coreg 3.125 twice daily, continue Eliquis 5 twice daily Can continue Imdur 15 daily--hyrdalazine stopped Has paced rhythm on monitor and can discontinue monitors tendency to hypotension Midodrine held at this time-1-2 episodes of low blood pressures but otherwise has been stable Unless MAP persistently below 65 would discontinue this medication Diabetes mellitus type 2 Long-acting 15 units held but resumed at lower dose, resumed glipizide on d/c Short acting TID Sugars ranging 90s to 200 Prior DVT April 2022 and CVAs at the same time Continue Eliquis as above CKD 4 Etiology probably underlying diabetes and HTN with poor forward flow GFR is close to 15 and will need outpatient discussion with renal Chr biliary drain 02/2021 Patient states that surgeon has told her she was never really been operative candidate-this will probably need to have continued attention   Discharge Exam: Vitals:   07/20/22 0008  07/20/22 0348  BP: (!) 134/55 (!) 144/59  Pulse: (!) 59 63  Resp: 18 18  Temp: 99 F (37.2 C) 98.6 F (37  C)  SpO2: 97% 98%    Subj on day of d/c   Awake coherent no fever no cough no n/v  General Exam on discharge  Coherent in nad no focal deficit moving limbs x 4 without issue Cta b no added sound Abd soft drain in place   Discharge Instructions   Discharge Instructions     Diet - low sodium heart healthy   Complete by: As directed    Increase activity slowly   Complete by: As directed       Allergies as of 07/20/2022   No Known Allergies      Medication List     STOP taking these medications    acetaminophen 325 MG tablet Commonly known as: TYLENOL   docusate sodium 100 MG capsule Commonly known as: COLACE   feeding supplement (GLUCERNA SHAKE) Liqd   furosemide 40 MG tablet Commonly known as: LASIX   hydrALAZINE 50 MG tablet Commonly known as: APRESOLINE   hydrocortisone cream 1 %   hydrOXYzine 10 MG tablet Commonly known as: ATARAX   midodrine 2.5 MG tablet Commonly known as: PROAMATINE   sodium chloride flush 0.9 % Soln Commonly known as: NS   traZODone 50 MG tablet Commonly known as: DESYREL       TAKE these medications    apixaban 5 MG Tabs tablet Commonly known as: ELIQUIS Take 1 tablet (5 mg total) by mouth 2 (two) times daily.   carvedilol 3.125 MG tablet Commonly known as: COREG Take 1 tablet (3.125 mg total) by mouth 2 (two) times daily with a meal.   glipiZIDE 5 MG 24 hr tablet Commonly known as: GLUCOTROL XL Take 5 mg by mouth daily.   insulin aspart 100 UNIT/ML injection Commonly known as: novoLOG Inject 3 Units into the skin 3 (three) times daily with meals. What changed: Another medication with the same name was removed. Continue taking this medication, and follow the directions you see here.   insulin glargine-yfgn 100 UNIT/ML injection Commonly known as: SEMGLEE Inject 0.1 mLs (10 Units total) into the skin daily. What changed: how much to take   isosorbide mononitrate 30 MG 24 hr tablet Commonly known as:  IMDUR Take 0.5 tablets (15 mg total) by mouth daily.   multivitamin with minerals Tabs tablet Take 1 tablet by mouth daily.   polyethylene glycol 17 g packet Commonly known as: MIRALAX / GLYCOLAX Take 17 g by mouth daily as needed for mild constipation.       No Known Allergies    The results of significant diagnostics from this hospitalization (including imaging, microbiology, ancillary and laboratory) are listed below for reference.    Significant Diagnostic Studies: DG CHEST PORT 1 VIEW  Result Date: 07/18/2022 CLINICAL DATA:  Shortness of breath. EXAM: PORTABLE CHEST 1 VIEW COMPARISON:  One-view chest x-ray 07/13/2022 FINDINGS: The heart is enlarged. Pacing wires are stable. Mild pulmonary vascular congestion is present, the overall interstitial pattern is slightly increased from the prior exam. No focal airspace consolidation is present. No pneumothorax or significant effusion. Visualized soft tissues and bony thorax are unremarkable. IMPRESSION: 1. Stable cardiomegaly with slight increase in mild pulmonary vascular congestion. 2. No focal airspace disease. Electronically Signed   By: San Morelle M.D.   On: 07/18/2022 06:10   MR BRAIN WO CONTRAST  Result Date: 07/16/2022 CLINICAL DATA:  Seizure, new onset EXAM: MRI HEAD WITHOUT CONTRAST TECHNIQUE: Multiplanar, multiecho pulse sequences of the brain and surrounding structures were obtained without intravenous contrast. COMPARISON:  05/25/2022 MRI FINDINGS: Brain: No restricted diffusion to suggest acute or subacute infarct. No acute hemorrhage, mass, mass effect, or midline shift. No hydrocephalus or extra-axial collection. Redemonstrated chronic infarcts in the inferior right frontal, right parietal, right posterior temporal, and right occipital lobes. Scattered T2 hyperintense signal in the periventricular white matter, likely the sequela of chronic small vessel ischemic disease. Hemosiderin deposition along the medial left  temporal lobe, likely sequela of prior microhemorrhage. Hemosiderin deposition is also associated with the right temporal lobe infarct. Vascular: Normal arterial flow voids. Skull and upper cervical spine: Normal marrow signal. Sinuses/Orbits: Mucous retention cysts in the right maxillary sinus. Mucosal thickening in the ethmoid air cells. The orbits are unremarkable. Other: Fluid in the bilateral mastoid air cells. IMPRESSION: No acute intracranial process. No evidence of acute or subacute infarct. Electronically Signed   By: Merilyn Baba M.D.   On: 07/16/2022 14:06   Overnight EEG with video  Result Date: 07/15/2022 Lora Havens, MD     07/16/2022  8:52 AM Patient Name: Zaineb D Vestal MRN: [id number] Epilepsy Attending: Lora Havens Referring Physician/Provider: Donnetta Simpers, MD Duration: 07/15/2022 1324 to 07/16/2022 4010  Patient history: 71 yo woman with pmhx a fib on eliquis, DM2, HTN, prior hx CVA with residual R sided weakness admitted after syncopal episode. Neurology received urgent teleconsult for this patient for possible seizure activity. EEG to evaluate for seizure.  Level of alertness: Awake, asleep  AEDs during EEG study: LEV  Technical aspects: This EEG study was done with scalp electrodes positioned according to the 10-20 International system of electrode placement. Electrical activity was acquired at a sampling rate of '500Hz'$  and reviewed with a high frequency filter of '70Hz'$  and a low frequency filter of '1Hz'$ . EEG data were recorded continuously and digitally stored.  Description: The posterior dominant rhythm consists of 8-9 Hz activity of moderate voltage (25-35 uV) seen predominantly in posterior head regions, symmetric and reactive to eye opening and eye closing. Sleep was characterized by vertex waves, sleep spindles (12 to 14 Hz), maximal frontocentral region. Hyperventilation and photic stimulation were not performed.   One event was recorded on 07/15/2022 at 0908 during which  examiner asked patient to raise her arm and she had whole body violent jerking which lasted for 6 to 7 seconds. Concomitant EEG before, during and after the event did not show any EEG change to suggest seizure. One event was recorded on 07/15/2022 at 1336 during which she had whole body violent jerking which lasted for 3-4 seconds. Concomitant EEG before, during and after the event did not show any EEG change to suggest seizure. One event was recorded on 07/15/2022 at 1343 during which she had whole body jerking and side-to-side head movement which lasted for 5 to 7 seconds.  She was working with physical therapy who then helped her sit up at the edge of the bed.  While sitting up, she had side-to-side movement of her head as well as jerking.  Concomitant EEG before, during and after the event did not show any EEG change to suggest seizure.  IMPRESSION: This study is within normal limits. No seizures or epileptiform discharges were seen throughout the recording. Three events were recorded on 07/15/2022 as described above without concomitant EEG change.  These events were NON-epileptic.  Priyanka Barbra Sarks   EEG adult  Result  Date: 07/14/2022 Lora Havens, MD     07/14/2022  6:50 PM Patient Name: Romonia D Yeley MRN: [id number] Epilepsy Attending: Lora Havens Referring Physician/Provider: Heath Lark D,MD Date: 07/14/2022 Duration: 23.30 mins Patient history: 71 yo woman with pmhx a fib on eliquis, DM2, HTN, prior hx CVA with residual R sided weakness admitted after syncopal episode. Neurology received urgent teleconsult for this patient for possible seizure activity. EEG to evaluate for seizure. Level of alertness: Awake, asleep AEDs during EEG study: LEV Technical aspects: This EEG study was done with scalp electrodes positioned according to the 10-20 International system of electrode placement. Electrical activity was acquired at a sampling rate of '500Hz'$  and reviewed with a high frequency filter of '70Hz'$  and  a low frequency filter of '1Hz'$ . EEG data were recorded continuously and digitally stored. Description: The posterior dominant rhythm consists of 8-9 Hz activity of moderate voltage (25-35 uV) seen predominantly in posterior head regions, symmetric and reactive to eye opening and eye closing. Sleep was characterized by vertex waves, sleep spindles (12 to 14 Hz), maximal frontocentral region.    Hyperventilation and photic stimulation were not performed.   IMPRESSION: This study is within normal limits. No seizures or epileptiform discharges were seen throughout the recording. Lora Havens   ECHOCARDIOGRAM COMPLETE  Result Date: 07/14/2022    ECHOCARDIOGRAM REPORT   Patient Name:   SYEDA PRICKETT Date of Exam: 07/14/2022 Medical Rec #:  [id number]       Height:       66.0 in Accession #:    [id number]      Weight:       157.4 lb Date of Birth:  Jun 04, 1951       BSA:          1.806 m Patient Age:    42 years        BP:           100/58 mmHg Patient Gender: F               HR:           60 bpm. Exam Location:  [facility] Procedure: 2D Echo, Cardiac Doppler and Color Doppler Indications:    Syncope  History:        Patient has prior history of Echocardiogram examinations, most                 recent 05/20/2022. CHF, Stroke, Arrythmias:Atrial Fibrillation,                 Signs/Symptoms:Syncope and Dizziness/Lightheadedness; Risk                 Factors:Hypertension and Diabetes.  Sonographer:    Wenda Low Referring Phys: [id number] Langlade D Dunn  1. Left ventricular ejection fraction, by estimation, is 50%. The left ventricle has low normal function. The left ventricle has no regional wall motion abnormalities. There is moderate left ventricular hypertrophy. Left ventricular diastolic parameters  are consistent with Grade I diastolic dysfunction (impaired relaxation). Elevated left atrial pressure.  2. Right ventricular systolic function is normal. The right ventricular size is normal. Tricuspid  regurgitation signal is inadequate for assessing PA pressure.  3. Left atrial size was mildly dilated.  4. The mitral valve is normal in structure. No evidence of mitral valve regurgitation. No evidence of mitral stenosis.  5. The aortic valve has an indeterminant number of cusps. There is mild calcification of the aortic valve. There is mild thickening  of the aortic valve. Aortic valve regurgitation is not visualized. No aortic stenosis is present.  6. The inferior vena cava is normal in size with greater than 50% respiratory variability, suggesting right atrial pressure of 3 mmHg. FINDINGS  Left Ventricle: Left ventricular ejection fraction, by estimation, is 50%. The left ventricle has low normal function. The left ventricle has no regional wall motion abnormalities. The left ventricular internal cavity size was normal in size. There is moderate left ventricular hypertrophy. Left ventricular diastolic parameters are consistent with Grade I diastolic dysfunction (impaired relaxation). Elevated left atrial pressure. Right Ventricle: The right ventricular size is normal. Right vetricular wall thickness was not well visualized. Right ventricular systolic function is normal. Tricuspid regurgitation signal is inadequate for assessing PA pressure. Left Atrium: Left atrial size was mildly dilated. Right Atrium: Right atrial size was normal in size. Pericardium: There is no evidence of pericardial effusion. Mitral Valve: The mitral valve is normal in structure. There is mild thickening of the mitral valve leaflet(s). There is mild calcification of the mitral valve leaflet(s). Mild mitral annular calcification. No evidence of mitral valve regurgitation. No evidence of mitral valve stenosis. MV peak gradient, 2.6 mmHg. The mean mitral valve gradient is 1.0 mmHg. Tricuspid Valve: The tricuspid valve is normal in structure. Tricuspid valve regurgitation is not demonstrated. No evidence of tricuspid stenosis. Aortic Valve: The  aortic valve has an indeterminant number of cusps. There is mild calcification of the aortic valve. There is mild thickening of the aortic valve. There is mild aortic valve annular calcification. Aortic valve regurgitation is not visualized. No aortic stenosis is present. Aortic valve mean gradient measures 2.0 mmHg. Aortic valve peak gradient measures 4.7 mmHg. Aortic valve area, by VTI measures 3.17 cm. Pulmonic Valve: The pulmonic valve was not well visualized. Pulmonic valve regurgitation is not visualized. No evidence of pulmonic stenosis. Aorta: The aortic root is normal in size and structure. Venous: The inferior vena cava is normal in size with greater than 50% respiratory variability, suggesting right atrial pressure of 3 mmHg. IAS/Shunts: No atrial level shunt detected by color flow Doppler.  LEFT VENTRICLE PLAX 2D LVIDd:         4.60 cm     Diastology LVIDs:         3.30 cm     LV e' medial:    3.26 cm/s LV PW:         1.20 cm     LV E/e' medial:  20.0 LV IVS:        1.20 cm     LV e' lateral:   5.22 cm/s LVOT diam:     2.10 cm     LV E/e' lateral: 12.5 LV SV:         79 LV SV Index:   44 LVOT Area:     3.46 cm  LV Volumes (MOD) LV vol d, MOD A2C: 46.2 ml LV vol d, MOD A4C: 59.5 ml LV vol s, MOD A2C: 25.8 ml LV vol s, MOD A4C: 27.1 ml LV SV MOD A2C:     20.4 ml LV SV MOD A4C:     59.5 ml LV SV MOD BP:      28.0 ml RIGHT VENTRICLE RV Basal diam:  3.55 cm RV Mid diam:    3.70 cm RV S prime:     7.29 cm/s TAPSE (M-mode): 1.9 cm LEFT ATRIUM             Index  RIGHT ATRIUM           Index LA diam:        4.50 cm 2.49 cm/m   RA Area:     21.30 cm LA Vol (A2C):   77.1 ml 42.68 ml/m  RA Volume:   66.60 ml  36.87 ml/m LA Vol (A4C):   65.5 ml 36.26 ml/m LA Biplane Vol: 72.1 ml 39.92 ml/m  AORTIC VALVE                    PULMONIC VALVE AV Area (Vmax):    3.01 cm     PV Vmax:       0.89 m/s AV Area (Vmean):   2.95 cm     PV Peak grad:  3.2 mmHg AV Area (VTI):     3.17 cm AV Vmax:           108.00  cm/s AV Vmean:          70.500 cm/s AV VTI:            0.248 m AV Peak Grad:      4.7 mmHg AV Mean Grad:      2.0 mmHg LVOT Vmax:         94.00 cm/s LVOT Vmean:        60.000 cm/s LVOT VTI:          0.227 m LVOT/AV VTI ratio: 0.92  AORTA Ao Root diam: 3.00 cm Ao Asc diam:  3.00 cm MITRAL VALVE MV Area (PHT): 2.53 cm    SHUNTS MV Area VTI:   2.84 cm    Systemic VTI:  0.23 m MV Peak grad:  2.6 mmHg    Systemic Diam: 2.10 cm MV Mean grad:  1.0 mmHg MV Vmax:       0.81 m/s MV Vmean:      47.1 cm/s MV Decel Time: 300 msec MV E velocity: 65.10 cm/s MV A velocity: 80.10 cm/s MV E/A ratio:  0.81 Carlyle Dolly MD Electronically signed by Carlyle Dolly MD Signature Date/Time: 07/14/2022/11:38:09 AM    Final    US Carotid Bilateral  Result Date: 07/14/2022 CLINICAL DATA:  Syncope Hypertension Seizures Diabetes EXAM: BILATERAL CAROTID DUPLEX ULTRASOUND TECHNIQUE: Pearline Cables scale imaging, color Doppler and duplex ultrasound were performed of bilateral carotid and vertebral arteries in the neck. COMPARISON:  None available FINDINGS: Criteria: Quantification of carotid stenosis is based on velocity parameters that correlate the residual internal carotid diameter with NASCET-based stenosis levels, using the diameter of the distal internal carotid lumen as the denominator for stenosis measurement. The following velocity measurements were obtained: RIGHT ICA: 60/20 cm/sec CCA: 59/5 cm/sec SYSTOLIC ICA/CCA RATIO:  1.4 ECA: 74 cm/sec LEFT ICA: 50/17 cm/sec CCA: 63/87 cm/sec SYSTOLIC ICA/CCA RATIO:  0.9 ECA: 71 cm/sec RIGHT CAROTID ARTERY: Minimal calcified plaque of the right carotid bulb. RIGHT VERTEBRAL ARTERY:  Antegrade flow. LEFT CAROTID ARTERY: Minimal atheromatous plaque of the left distal common carotid artery. LEFT VERTEBRAL ARTERY:  Antegrade flow. IMPRESSION: No significant stenosis of internal carotid arteries. Electronically Signed   By: Miachel Roux M.D.   On: 07/14/2022 11:29   DG Hip Unilat W or Wo Pelvis 2-3  Views Right  Result Date: 07/13/2022 CLINICAL DATA:  Trauma, fall EXAM: DG HIP (WITH OR WITHOUT PELVIS) 2-3V RIGHT COMPARISON:  None Available. FINDINGS: No fracture or dislocation is seen. Arterial calcifications are seen in soft tissues. IMPRESSION: No fracture or dislocation is seen in right hip. Electronically Signed  By: Elmer Picker M.D.   On: 07/13/2022 10:44   DG Shoulder Right  Result Date: 07/13/2022 CLINICAL DATA:  Trauma, fall, pain EXAM: RIGHT SHOULDER - 2+ VIEW COMPARISON:  None Available. FINDINGS: No recent fracture or dislocation is seen in right shoulder. Osteopenia is seen in bony structures. Small smoothly marginated calcification adjacent to the [facility] joint may be due to degenerative arthritis. 2 mm faint calcification adjacent to the lateral margin of right acromion may be residual from previous injury. IMPRESSION: No recent fracture or dislocation is seen in right shoulder. Electronically Signed   By: Elmer Picker M.D.   On: 07/13/2022 10:43   DG Chest 1 View  Result Date: 07/13/2022 CLINICAL DATA:  Syncope EXAM: CHEST  1 VIEW COMPARISON:  05/28/2022 FINDINGS: Transverse diameter of heart is slightly increased. There are no signs of pulmonary edema or focal pulmonary consolidation. There is interval decrease in pulmonary vascular congestion. There is no pleural effusion or pneumothorax. Pacemaker battery is seen in the left infraclavicular region. IMPRESSION: No active disease is seen in the chest. Electronically Signed   By: Elmer Picker M.D.   On: 07/13/2022 10:42   CT Head Wo Contrast  Result Date: 07/13/2022 CLINICAL DATA:  Syncope, fall, dizziness EXAM: CT HEAD WITHOUT CONTRAST TECHNIQUE: Contiguous axial images were obtained from the base of the skull through the vertex without intravenous contrast. RADIATION DOSE REDUCTION: This exam was performed according to the departmental dose-optimization program which includes automated exposure control,  adjustment of the mA and/or kV according to patient size and/or use of iterative reconstruction technique. COMPARISON:  Previous CT done on 04/05/2021, MR brain done on 05/25/2022 FINDINGS: Brain: No acute intracranial findings are seen. There are no signs of bleeding within the cranium. There are areas of encephalomalacia in the right frontal, parietal and occipital lobes consistent with old infarcts. There is no focal edema or mass effect. Ventricles are not dilated. Cortical sulci are prominent. Vascular: There are scattered arterial calcifications. Skull: No fracture is seen in the calvarium. Sinuses/Orbits: Unremarkable. Other: None. IMPRESSION: No acute intracranial findings are seen in noncontrast CT brain. There are multiple old infarcts in right cerebellar hemisphere. Atrophy. Electronically Signed   By: Elmer Picker M.D.   On: 07/13/2022 10:28    Microbiology: Recent Results (from the past 240 hour(s))  MRSA Next Gen by PCR, Nasal     Status: Abnormal   Collection Time: 07/14/22  7:46 PM   Specimen: Nasal Mucosa; Nasal Swab  Result Value Ref Range Status   MRSA by PCR Next Gen DETECTED (A) NOT DETECTED Final    Comment: RESULT CALLED TO, READ BACK BY AND VERIFIED WITH: FRAYLE R @ 0958 ON [id number] BY HENDERSON L (NOTE) The GeneXpert MRSA Assay (FDA approved for NASAL specimens only), is one component of a comprehensive MRSA colonization surveillance program. It is not intended to diagnose MRSA infection nor to guide or monitor treatment for MRSA infections. Test performance is not FDA approved in patients less than 66 years old. Performed at [facility], [address], [place], [place] [address]   Culture, blood (Routine X 2) w Reflex to ID Panel     Status: None (Preliminary result)   Collection Time: 07/17/22  9:36 PM   Specimen: BLOOD RIGHT HAND  Result Value Ref Range Status   Specimen Description BLOOD RIGHT HAND  Final   Special Requests   Final    BOTTLES DRAWN AEROBIC  AND ANAEROBIC Blood Culture adequate volume   Culture  Final    NO GROWTH 3 DAYS Performed at [facility], [place] [address] [place], [place] [address]    Report Status PENDING  Incomplete  Culture, blood (Routine X 2) w Reflex to ID Panel     Status: None (Preliminary result)   Collection Time: 07/17/22  9:44 PM   Specimen: BLOOD  Result Value Ref Range Status   Specimen Description BLOOD RIGHT ANTECUBITAL  Final   Special Requests   Final    BOTTLES DRAWN AEROBIC AND ANAEROBIC Blood Culture adequate volume   Culture   Final    NO GROWTH 3 DAYS Performed at [facility], [place] [address] [place], [place] [address]    Report Status PENDING  Incomplete     Labs: Basic Metabolic Panel: Recent Labs  Lab 07/14/22 0617 07/15/22 0856 07/16/22 0541 07/17/22 0632 07/18/22 0407 07/19/22 0159  NA 138 140 138 131* 134* 134*  K 4.1 4.4 4.2 3.9 4.4 4.3  CL 107 108 108 105 106 108  CO2 24 18* 21* 19* 20* 18*  GLUCOSE 107* 90 110* 119* 120* 101*  BUN 86* 70* 63* 49* 46* 40*  CREATININE 3.15* 2.72* 2.71* 2.53* 2.97* 2.59*  CALCIUM 8.7* 8.7* 8.8* 8.1* 8.1* 7.6*  MG 3.3*  --   --   --   --   --    Liver Function Tests: Recent Labs  Lab 07/14/22 0617 07/15/22 0856 07/16/22 0541 07/18/22 0407 07/19/22 0159  AST '23 20 20 '$ 42* 31  ALT '14 14 13 '$ 33 24  ALKPHOS 59 51 55 73 63  BILITOT 0.8 1.0 0.9 0.4 0.3  PROT 7.9 7.2 7.6 6.5 5.7*  ALBUMIN 3.1* 2.7* 2.8* 2.4* 2.1*   Recent Labs  Lab 07/13/22 1116 07/14/22 0617  LIPASE 102* 73*   No results for input(s): "AMMONIA" in the last 168 hours. CBC: Recent Labs  Lab 07/13/22 1116 07/14/22 0617 07/17/22 2136 07/18/22 0407 07/19/22 0159  WBC 12.5* 10.5 7.2  --  4.6  NEUTROABS 8.2*  --   --   --   --   HGB 12.4 11.3* 6.1* 9.2* 9.0*  HCT 36.8 35.5* 18.3* 27.4* 27.4*  MCV 84.6 89.0 86.7  --  87.3  PLT 423* 404* 301  --  214   Cardiac Enzymes: No results for input(s): "CKTOTAL", "CKMB", "CKMBINDEX", "TROPONINI" in the  last 168 hours. BNP: BNP (last 3 results) Recent Labs    08/04/21 1350 12/25/21 2110 05/19/22 1040  BNP 942.4* 3,291.0* 2,083.9*    ProBNP (last 3 results) Recent Labs    05/04/22 1625  PROBNP 15,044*    CBG: Recent Labs  Lab 07/19/22 0607 07/19/22 1142 07/19/22 1629 07/19/22 2141 07/20/22 0618  GLUCAP 94 206* 214* 128* 115*       Signed:  Nita Sells MD   [facility] 07/20/2022, 8:05 AM

## 2022-07-20 NOTE — Progress Notes (Signed)
Report called to [facility] rehab, s/w Danny.

## 2022-07-20 NOTE — TOC Transition Note (Signed)
Transition of Care [facility]) - CM/SW Discharge Note   Patient Details  Name: Tracey Bryant MRN: [id number] Date of Birth: 29-Jul-1951  [facility] CM/SW Contact:  Geralynn Ochs, LCSW Phone Number: 07/20/2022, 11:40 AM   Clinical Narrative:   CSW updated that insurance authorization was approved, and [facility] has bed available today. CSW sent discharge information to [facility]. Patient in agreement to transfer today. Transport scheduled with [facility] for next available.  Nurse to call report to [phone].    Final next level of care: Skilled Nursing Facility Barriers to Discharge: Barriers Resolved   Patient Goals and CMS Choice        Discharge Placement              Patient chooses bed at: [facility] and [place] Patient to be transferred to facility by: [place] Name of family member notified: Self Patient and family notified of of transfer: 07/20/22  Discharge Plan and Services                                     Social Determinants of Health (SDOH) Interventions     Readmission Risk Interventions    12/27/2021    2:32 PM 03/19/2021    9:34 AM 03/17/2021   11:56 AM  Readmission Risk Prevention Plan  Transportation Screening Complete  Complete  PCP or Specialist Appt within 5-7 Days  Complete   PCP or Specialist Appt within 3-5 Days Complete    Home Care Screening  Complete   Medication Review (RN CM)   Complete  HRI or Home Care Consult Complete    Social Work Consult for [place] Planning/Counseling Complete    Palliative Care Screening Not Applicable    Medication Review [employer]) Complete

## 2022-07-22 LAB — CULTURE, BLOOD (ROUTINE X 2)
Culture: NO GROWTH
Culture: NO GROWTH
Special Requests: ADEQUATE
Special Requests: ADEQUATE

## 2022-11-01 ENCOUNTER — Emergency Department (HOSPITAL_COMMUNITY)
Admission: EM | Admit: 2022-11-01 | Discharge: 2022-11-03 | Disposition: A | Discharge status: Skilled Nursing Facility | Payer: Medicare HMO | Attending: Emergency Medicine | Admitting: Emergency Medicine

## 2022-11-01 ENCOUNTER — Other Ambulatory Visit: Payer: Self-pay

## 2022-11-01 ENCOUNTER — Encounter (HOSPITAL_COMMUNITY): Payer: Self-pay | Admitting: *Deleted

## 2022-11-01 ENCOUNTER — Emergency Department (HOSPITAL_COMMUNITY): Payer: Medicare HMO

## 2022-11-01 DIAGNOSIS — I509 Heart failure, unspecified: Secondary | ICD-10-CM | POA: Insufficient documentation

## 2022-11-01 DIAGNOSIS — Z794 Long term (current) use of insulin: Secondary | ICD-10-CM | POA: Diagnosis not present

## 2022-11-01 DIAGNOSIS — M25552 Pain in left hip: Secondary | ICD-10-CM | POA: Insufficient documentation

## 2022-11-01 DIAGNOSIS — M79605 Pain in left leg: Secondary | ICD-10-CM | POA: Insufficient documentation

## 2022-11-01 DIAGNOSIS — R5381 Other malaise: Secondary | ICD-10-CM | POA: Insufficient documentation

## 2022-11-01 DIAGNOSIS — E119 Type 2 diabetes mellitus without complications: Secondary | ICD-10-CM | POA: Diagnosis not present

## 2022-11-01 DIAGNOSIS — I110 Hypertensive heart disease with heart failure: Secondary | ICD-10-CM | POA: Diagnosis not present

## 2022-11-01 DIAGNOSIS — Z7901 Long term (current) use of anticoagulants: Secondary | ICD-10-CM | POA: Diagnosis not present

## 2022-11-01 DIAGNOSIS — Z7984 Long term (current) use of oral hypoglycemic drugs: Secondary | ICD-10-CM | POA: Diagnosis not present

## 2022-11-01 LAB — COMPREHENSIVE METABOLIC PANEL
ALT: 7 U/L (ref 0–44)
AST: 12 U/L — ABNORMAL LOW (ref 15–41)
Albumin: 2.2 g/dL — ABNORMAL LOW (ref 3.5–5.0)
Alkaline Phosphatase: 41 U/L (ref 38–126)
Anion gap: 13 (ref 5–15)
BUN: 36 mg/dL — ABNORMAL HIGH (ref 8–23)
CO2: 16 mmol/L — ABNORMAL LOW (ref 22–32)
Calcium: 7.9 mg/dL — ABNORMAL LOW (ref 8.9–10.3)
Chloride: 109 mmol/L (ref 98–111)
Creatinine, Ser: 2.6 mg/dL — ABNORMAL HIGH (ref 0.44–1.00)
GFR, Estimated: 19 mL/min — ABNORMAL LOW (ref >60)
Glucose, Bld: 83 mg/dL (ref 70–99)
Potassium: 4.3 mmol/L (ref 3.5–5.1)
Sodium: 138 mmol/L (ref 135–145)
Total Bilirubin: 1.3 mg/dL — ABNORMAL HIGH (ref 0.3–1.2)
Total Protein: 7.2 g/dL (ref 6.5–8.1)

## 2022-11-01 LAB — CBC WITH DIFFERENTIAL/PLATELET
Abs Immature Granulocytes: 0.03 10*3/uL (ref 0.00–0.07)
Basophils Absolute: 0 10*3/uL (ref 0.0–0.1)
Basophils Relative: 0 %
Eosinophils Absolute: 0.9 10*3/uL — ABNORMAL HIGH (ref 0.0–0.5)
Eosinophils Relative: 9 %
HCT: 24.2 % — ABNORMAL LOW (ref 36.0–46.0)
Hemoglobin: 7.4 g/dL — ABNORMAL LOW (ref 12.0–15.0)
Immature Granulocytes: 0 %
Lymphocytes Relative: 19 %
Lymphs Abs: 1.8 10*3/uL (ref 0.7–4.0)
MCH: 30.3 pg (ref 26.0–34.0)
MCHC: 30.6 g/dL (ref 30.0–36.0)
MCV: 99.2 fL (ref 80.0–100.0)
Monocytes Absolute: 0.6 10*3/uL (ref 0.1–1.0)
Monocytes Relative: 6 %
Neutro Abs: 6.5 10*3/uL (ref 1.7–7.7)
Neutrophils Relative %: 66 %
Platelets: 439 10*3/uL — ABNORMAL HIGH (ref 150–400)
RBC: 2.44 MIL/uL — ABNORMAL LOW (ref 3.87–5.11)
RDW: 17 % — ABNORMAL HIGH (ref 11.5–15.5)
WBC: 9.8 10*3/uL (ref 4.0–10.5)
nRBC: 0 % (ref 0.0–0.2)

## 2022-11-01 LAB — CBG MONITORING, ED: Glucose-Capillary: 67 mg/dL — ABNORMAL LOW (ref 70–99)

## 2022-11-01 LAB — PROTIME-INR
INR: 2.7 — ABNORMAL HIGH (ref 0.8–1.2)
Prothrombin Time: 28.6 seconds — ABNORMAL HIGH (ref 11.4–15.2)

## 2022-11-01 LAB — MAGNESIUM: Magnesium: 2.2 mg/dL (ref 1.7–2.4)

## 2022-11-01 LAB — CK: Total CK: 24 U/L — ABNORMAL LOW (ref 38–234)

## 2022-11-01 MED ORDER — FENTANYL CITRATE PF 50 MCG/ML IJ SOSY
50.0000 ug | PREFILLED_SYRINGE | Freq: Once | INTRAMUSCULAR | Status: AC
  Administered 2022-11-01: 50 ug via INTRAVENOUS
  Filled 2022-11-01: qty 1

## 2022-11-01 MED ORDER — APIXABAN 5 MG PO TABS
5.0000 mg | ORAL_TABLET | Freq: Two times a day (BID) | ORAL | Status: DC
  Administered 2022-11-02 – 2022-11-03 (×4): 5 mg via ORAL
  Filled 2022-11-01 (×4): qty 1

## 2022-11-01 MED ORDER — ISOSORBIDE MONONITRATE ER 30 MG PO TB24
15.0000 mg | ORAL_TABLET | Freq: Every day | ORAL | Status: DC
  Administered 2022-11-02 – 2022-11-03 (×2): 15 mg via ORAL
  Filled 2022-11-01 (×2): qty 1

## 2022-11-01 MED ORDER — INSULIN ASPART 100 UNIT/ML IJ SOLN
0.0000 [IU] | Freq: Three times a day (TID) | INTRAMUSCULAR | Status: DC
  Administered 2022-11-02: 3 [IU] via SUBCUTANEOUS
  Administered 2022-11-02: 2 [IU] via SUBCUTANEOUS
  Administered 2022-11-03: 1 [IU] via SUBCUTANEOUS
  Filled 2022-11-01 (×3): qty 1

## 2022-11-01 MED ORDER — OXYCODONE-ACETAMINOPHEN 5-325 MG PO TABS
1.0000 | ORAL_TABLET | Freq: Once | ORAL | Status: AC
  Administered 2022-11-01: 1 via ORAL
  Filled 2022-11-01: qty 1

## 2022-11-01 MED ORDER — POLYETHYLENE GLYCOL 3350 17 G PO PACK
17.0000 g | PACK | Freq: Every day | ORAL | Status: DC
  Filled 2022-11-01 (×2): qty 1

## 2022-11-01 MED ORDER — INSULIN ASPART 100 UNIT/ML IJ SOLN: 0.0000 [IU] | Freq: Every day | INTRAMUSCULAR | Status: DC

## 2022-11-01 MED ORDER — OXYCODONE-ACETAMINOPHEN 5-325 MG PO TABS
1.0000 | ORAL_TABLET | ORAL | Status: DC | PRN
  Administered 2022-11-02 – 2022-11-03 (×6): 1 via ORAL
  Filled 2022-11-01 (×7): qty 1

## 2022-11-01 MED ORDER — CARVEDILOL 3.125 MG PO TABS
3.1250 mg | ORAL_TABLET | Freq: Two times a day (BID) | ORAL | Status: DC
  Administered 2022-11-02 – 2022-11-03 (×3): 3.125 mg via ORAL
  Filled 2022-11-01 (×3): qty 1

## 2022-11-01 NOTE — ED Provider Notes (Signed)
[facility] EMERGENCY DEPARTMENT Provider Note   CSN: [id number] Arrival date & time: 11/01/22  1653     History {Add pertinent medical, surgical, social history, OB history to HPI:1} No chief complaint on file.   Tracey Bryant is a 71 y.o. female.  HPI Patient presents for left hip pain.  Medical history includes T2DM, rheumatoid arthritis, HTN, anemia, CVA, ablation, CHF, seizures.  She had a recent left hip fracture secondary to a fall at a rehab facility.  She underwent left hemiarthroplasty on 10/23 at [facility].  She was discharged home 1 week ago and is currently staying with her son.  She denies any more recent falls.  She states that she has had ongoing left hip pain that radiates throughout her left leg since her prior hospitalization.  Medications at time of discharge include iron supplement, oxycodone, Seroquel, MiraLAX, Colace, Eliquis, Depakote.  She states that she has been taking her oxycodone.  Pain has been unchanged.  It has been very poorly controlled.  Patient has been able to sit up.  She has not yet been able to stand.  She did have a physical therapist come to the house today.  Physical therapist called the ambulance due to concern of uncontrolled pain.    Home Medications Prior to Admission medications   Medication Sig Start Date End Date Taking? Authorizing Provider  apixaban (ELIQUIS) 5 MG TABS tablet Take 1 tablet (5 mg total) by mouth 2 (two) times daily. 08/04/21   Pattricia Boss, MD  carvedilol (COREG) 3.125 MG tablet Take 1 tablet (3.125 mg total) by mouth 2 (two) times daily with a meal. 06/09/22   Samella Parr, NP  glipiZIDE (GLUCOTROL XL) 5 MG 24 hr tablet Take 5 mg by mouth daily. 06/24/22   [provider]  insulin aspart (NOVOLOG) 100 UNIT/ML injection Inject 3 Units into the skin 3 (three) times daily with meals. 06/09/22   Samella Parr, NP  insulin glargine-yfgn (SEMGLEE) 100 UNIT/ML injection Inject 0.1 mLs (10 Units  total) into the skin daily. 07/20/22   Nita Sells, MD  isosorbide mononitrate (IMDUR) 30 MG 24 hr tablet Take 0.5 tablets (15 mg total) by mouth daily. 06/10/22   Samella Parr, NP  Multiple Vitamin (MULTIVITAMIN WITH MINERALS) TABS tablet Take 1 tablet by mouth daily. 06/10/22   Samella Parr, NP  polyethylene glycol (MIRALAX / GLYCOLAX) 17 g packet Take 17 g by mouth daily as needed for mild constipation. 06/09/22   Samella Parr, NP      Allergies    Patient has no known allergies.    Review of Systems   Review of Systems  Musculoskeletal:  Positive for arthralgias.  All other systems reviewed and are negative.   Physical Exam Updated Vital Signs There were no vitals taken for this visit. Physical Exam Vitals and nursing note reviewed.  Constitutional:      General: She is not in acute distress.    Appearance: Normal appearance. She is well-developed. She is not ill-appearing, toxic-appearing or diaphoretic.  HENT:     Head: Normocephalic and atraumatic.     Right Ear: External ear normal.     Left Ear: External ear normal.     Nose: Nose normal.     Mouth/Throat:     Mouth: Mucous membranes are moist.     Pharynx: Oropharynx is clear.  Eyes:     Extraocular Movements: Extraocular movements intact.     Conjunctiva/sclera: Conjunctivae normal.  Cardiovascular:     Rate and Rhythm: Normal rate and regular rhythm.     Heart sounds: No murmur heard. Pulmonary:     Effort: Pulmonary effort is normal. No respiratory distress.  Abdominal:     General: There is no distension.     Palpations: Abdomen is soft.     Tenderness: There is no abdominal tenderness.  Musculoskeletal:        General: Deformity (Internal rotation of LLE) present. No swelling.     Cervical back: Normal range of motion and neck supple.     Right lower leg: No edema.     Left lower leg: No edema.  Skin:    General: Skin is warm and dry.     Capillary Refill: Capillary refill takes less  than 2 seconds.     Coloration: Skin is pale. Skin is not jaundiced.  Neurological:     General: No focal deficit present.     Mental Status: She is alert and oriented to person, place, and time.     Cranial Nerves: No cranial nerve deficit.     Sensory: No sensory deficit.     Motor: No weakness.     Coordination: Coordination normal.  Psychiatric:        Mood and Affect: Mood normal.        Behavior: Behavior normal.     ED Results / Procedures / Treatments   Labs (all labs ordered are listed, but only abnormal results are displayed) Labs Reviewed - No data to display  EKG None  Radiology No results found.  Procedures Procedures  {Document cardiac monitor, telemetry assessment procedure when appropriate:1}  Medications Ordered in ED Medications - No data to display  ED Course/ Medical Decision Making/ A&P                           Medical Decision Making Amount and/or Complexity of Data Reviewed Labs: ordered. Radiology: ordered.  Risk Prescription drug management.   This patient presents to the ED for concern of left hip and leg pain, this involves an extensive number of treatment options, and is a complaint that carries with it a high risk of complications and morbidity.  The differential diagnosis includes postoperative pain, new injury, dislocation, rhabdomyolysis, DVT   Co morbidities that complicate the patient evaluation  T2DM, rheumatoid arthritis, HTN, anemia, CVA, ablation, CHF, seizures   Additional history obtained:  Additional history obtained from EMS External records from outside source obtained and reviewed including EMR   Lab Tests:  I Ordered, and personally interpreted labs.  The pertinent results include: Expected acute on chronic anemia following recent surgery, no leukocytosis, baseline CKD, baseline elevation in BUN, baseline non-anion gap metabolic acidosis   Imaging Studies ordered:  I ordered imaging studies including left  hip x-ray and CT scan I independently visualized and interpreted imaging which showed no new injuries, prosthetic hip is located.  There is a adjacent fluid collection consistent with hematoma. I agree with the radiologist interpretation   Cardiac Monitoring: / EKG:  The patient was maintained on a cardiac monitor.  I personally viewed and interpreted the cardiac monitored which showed an underlying rhythm of: Sinus rhythm   Consultations Obtained:  I requested consultation with the social work,  and discussed lab and imaging findings as well as pertinent plan - they recommend: Recommendations pending   Problem List / ED Course / Critical interventions / Medication management  Patient is  a 71 year old female presenting for ongoing left hip and leg pain.  She suffered a left hip fracture from a mechanical fall several weeks ago.  She underwent operative repair at the hospital in [place].  She was discharged home approximately 1 week ago.  She has been treating her pain at home with oxycodone with minimal relief.  She denies any new injuries.  Physical therapist came to the home today for the first time.  Physical therapist was concerned about leg rotation and uncontrolled pain.  For this reason, patient was brought to the ED via EMS.  EMS noted brief atrial fibrillation during transit, subsequently converted to sinus rhythm.  She remains in sinus rhythm on arrival in the ED.  Patient is alert and oriented.  She describes pain throughout her left leg.  There is mild swelling associated.  There is concern of DVT, however, patient is currently on Eliquis and has been taking her medications.  Patient's area of greatest pain is in the hip region.  She underwent x-ray imaging which showed concern of possible trochanteric lucency.  This prompted CT scan of her left hip.  CT scan showed no acute injuries.  There is an elongated fluid collection consistent with hematoma.  Although this is stability  indeterminate, patient has no leukocytosis and vital signs are not consistent with acute infection.  Patient's primary issue seems to be uncontrolled pain.  She was treated with Percocet and fentanyl while in the ED.  She currently lives at home with her son, who is gone throughout the day.  She has no other support at this time.  Patient feels that she is under care for at home.  Will obtain PT evaluation and social work consult for possible rehab facility placement.  Patient to remain in the ED awaiting these evaluations.  Home medications were ordered. I ordered medication including ***  for ***  Reevaluation of the patient after these medicines showed that the patient {resolved/improved/worsened:23923::"improved"} I have reviewed the patients home medicines and have made adjustments as needed   Social Determinants of Health:  ***   Test / Admission - Considered:  ***   {Document critical care time when appropriate:1} {Document review of labs and clinical decision tools ie heart score, Chads2Vasc2 etc:1}  {Document your independent review of radiology images, and any outside records:1} {Document your discussion with family members, caretakers, and with consultants:1} {Document social determinants of health affecting pt's care:1} {Document your decision making why or why not admission, treatments were needed:1} Final Clinical Impression(s) / ED Diagnoses Final diagnoses:  None    Rx / DC Orders ED Discharge Orders     None

## 2022-11-01 NOTE — ED Notes (Signed)
Patient transported to CT 

## 2022-11-01 NOTE — ED Triage Notes (Signed)
Pt brought in by [facility] for c/o left hip pain  Pt was in afib rvr upon initial assessment with ems and when pt was transferred to truck pt converted to NS  Cbg 101  Pt had left hip surgery on 10/23 but has been having continued pain since surgery, when PT came out to house they noticed pt has left leg rotation and bulging to left hip  Pt denies any fall or obvious possible dislocation

## 2022-11-02 ENCOUNTER — Emergency Department (HOSPITAL_COMMUNITY): Payer: Medicare HMO

## 2022-11-02 LAB — CBG MONITORING, ED
Glucose-Capillary: 130 mg/dL — ABNORMAL HIGH (ref 70–99)
Glucose-Capillary: 106 mg/dL — ABNORMAL HIGH (ref 70–99)
Glucose-Capillary: 200 mg/dL — ABNORMAL HIGH (ref 70–99)
Glucose-Capillary: 210 mg/dL — ABNORMAL HIGH (ref 70–99)
Glucose-Capillary: 156 mg/dL — ABNORMAL HIGH (ref 70–99)

## 2022-11-02 LAB — URINALYSIS, ROUTINE W REFLEX MICROSCOPIC
Bacteria, UA: NONE SEEN
Bilirubin Urine: NEGATIVE
Glucose, UA: NEGATIVE mg/dL
Ketones, ur: 5 mg/dL — AB
Nitrite: NEGATIVE
Protein, ur: >=300 mg/dL — AB
Specific Gravity, Urine: 1.02 (ref 1.005–1.030)
pH: 5 (ref 5.0–8.0)

## 2022-11-02 NOTE — Evaluation (Signed)
Physical Therapy Evaluation Patient Details Name: Tracey Bryant MRN: [id number] DOB: 07/11/1951 Today's Date: 11/02/2022  History of Present Illness  Tracey Bryant is a 71 y.o. female.     HPI  Patient presents for left hip pain.  Medical history includes T2DM, rheumatoid arthritis, HTN, anemia, CVA, ablation, CHF, seizures.  She had a recent left hip fracture secondary to a fall at a rehab facility.  She underwent left hemiarthroplasty on 10/23 at [facility].  She was discharged home 1 week ago and is currently staying with her son.  She denies any more recent falls.  She states that she has had ongoing left hip pain that radiates throughout her left leg since her prior hospitalization.  Medications at time of discharge include iron supplement, oxycodone, Seroquel, MiraLAX, Colace, Eliquis, Depakote.  She states that she has been taking her oxycodone.  Pain has been unchanged.  It has been very poorly controlled.  Patient has been able to sit up.  She has not yet been able to stand.  She did have a physical therapist come to the house today.  Physical therapist called the ambulance due to concern of uncontrolled pain.   Clinical Impression  Patient demonstrates slow labored movement for sitting up at bedside with c/o severe pain left hip with any movement or pressure.  Patient became very shaky with seizure like movement of extremities and unable to maintain sitting balance or attempt sit to stands/transfers.  Patient put back to bed with Max assist to reposition.  Nurse notified and assessed patient for possible seizure.  Patient will benefit from continued skilled physical therapy in hospital and recommended venue below to increase strength, balance, endurance for safe ADLs and gait.         Recommendations for follow up therapy are one component of a multi-disciplinary discharge planning process, led by the attending physician.  Recommendations may be updated based on patient  status, additional functional criteria and insurance authorization.  Follow Up Recommendations Skilled nursing-short term rehab (<3 hours/day) Can patient physically be transported by private vehicle: No    Assistance Recommended at Discharge Set up Supervision/Assistance  Patient can return home with the following  A lot of help with walking and/or transfers;A lot of help with bathing/dressing/bathroom;Assistance with cooking/housework;Assist for transportation;Help with stairs or ramp for entrance    Equipment Recommendations Rolling walker (2 wheels)  Recommendations for Other Services       Functional Status Assessment Patient has had a recent decline in their functional status and demonstrates the ability to make significant improvements in function in a reasonable and predictable amount of time.     Precautions / Restrictions Precautions Precautions: Fall Restrictions Weight Bearing Restrictions: No      Mobility  Bed Mobility Overal bed mobility: Needs Assistance Bed Mobility: Supine to Sit, Sit to Supine     Supine to sit: Mod assist, Max assist Sit to supine: Mod assist   General bed mobility comments: slow labored movement with c/o severe left hip pain with any movement, pressure    Transfers                        Ambulation/Gait                  Stairs            Wheelchair Mobility    Modified Rankin (Stroke Patients Only)       Balance Overall balance assessment:  Needs assistance Sitting-balance support: Feet supported, Bilateral upper extremity supported Sitting balance-Leahy Scale: Poor Sitting balance - Comments: seated at EOB                                     Pertinent Vitals/Pain Pain Assessment Pain Assessment: Faces Faces Pain Scale: Hurts even more Pain Location: left hip Pain Descriptors / Indicators: Discomfort, Grimacing, Guarding, Sharp Pain Intervention(s): Limited activity within  patient's tolerance, Monitored during session, Repositioned, Patient requesting pain meds-RN notified    Home Living Family/patient expects to be discharged to:: Private residence     Type of Home: House Home Access: Stairs to enter Entrance Stairs-Rails: Can reach both;Right;Left Entrance Stairs-Number of Steps: 3   Home Layout: One level Home Equipment: Rollator (4 wheels);Shower seat;BSC/3in1;Grab bars - Statistician (2 wheels);Cane - single point Additional Comments: Patient daughter lives next door and she and her teenage children assist PRN    Prior Function Prior Level of Function : Independent/Modified Independent             Mobility Comments: Patient reports ambulating at home independently with rollator. Not ambulating in the community and not currenlty driving. ADLs Comments: Patient reports being independent with ADL's with assist from daughter prn. Uses delivery service for groceries/shopping.     Hand Dominance   Dominant Hand: Right    Extremity/Trunk Assessment   Upper Extremity Assessment Upper Extremity Assessment: Generalized weakness    Lower Extremity Assessment Lower Extremity Assessment: Generalized weakness;LLE deficits/detail LLE Deficits / Details: grossly 3-/5 LLE: Unable to fully assess due to pain LLE Sensation: WNL LLE Coordination: WNL    Cervical / Trunk Assessment Cervical / Trunk Assessment: Normal  Communication   Communication: No difficulties  Cognition Arousal/Alertness: Awake/alert Behavior During Therapy: WFL for tasks assessed/performed Overall Cognitive Status: Within Functional Limits for tasks assessed                                          General Comments      Exercises     Assessment/Plan    PT Assessment Patient needs continued PT services  PT Problem List Decreased strength;Decreased activity tolerance;Decreased balance;Decreased mobility;Pain       PT Treatment  Interventions DME instruction;Gait training;Stair training;Functional mobility training;Therapeutic activities;Therapeutic exercise;Balance training;Patient/family education    PT Goals (Current goals can be found in the Care Plan section)  Acute Rehab PT Goals Patient Stated Goal: return home after rehab PT Goal Formulation: With patient Time For Goal Achievement: 11/16/22 Potential to Achieve Goals: Good    Frequency Min 2X/week     Co-evaluation               AM-PAC PT "6 Clicks" Mobility  Outcome Measure Help needed turning from your back to your side while in a flat bed without using bedrails?: A Lot Help needed moving from lying on your back to sitting on the side of a flat bed without using bedrails?: A Lot Help needed moving to and from a bed to a chair (including a wheelchair)?: Total Help needed standing up from a chair using your arms (e.g., wheelchair or bedside chair)?: Total Help needed to walk in hospital room?: Total Help needed climbing 3-5 steps with a railing? : Total 6 Click Score: 8    End of Session  Activity Tolerance: Patient tolerated treatment well;Patient limited by fatigue;Patient limited by pain Patient left: in bed;with call bell/phone within reach Nurse Communication: Mobility status;Other (comment) (seizure like symptoms) PT Visit Diagnosis: Unsteadiness on feet (R26.81);Other abnormalities of gait and mobility (R26.89);Muscle weakness (generalized) (M62.81)    Time: 4935-5217 PT Time Calculation (min) (ACUTE ONLY): 20 min   Charges:   PT Evaluation $PT Eval Moderate Complexity: 1 Mod PT Treatments $Therapeutic Activity: 8-22 mins        1:57 PM, 11/02/22 Lonell Grandchild, MPT Physical Therapist with [facility] [phone] office [phone] mobile phone

## 2022-11-02 NOTE — ED Notes (Signed)
Pt had a witnessed episode of "seizure-like activity" during PT eval with muscular twitches of extremities noted. Pt had a brief period of slurred speech afterwards but was a/o x 4 and able to answer questions. She states she has hx of similar episodes and takes seizure medication although her provider has diagnosed these spells as not true seizures and says that her symptoms "mimic" seizure activity. Vitals were unchanged throughout the episode and pt has since returned to baseline. MD notified.

## 2022-11-02 NOTE — ED Notes (Signed)
Modified diet order to include request for Dysphagia 2/mechanically soft foods. Pt has no teeth and cannot chew most solid foods.

## 2022-11-02 NOTE — Plan of Care (Signed)
  Problem: Acute Rehab PT Goals(only PT should resolve) Goal: Pt Will Go Supine/Side To Sit Outcome: Progressing Flowsheets (Taken 11/02/2022 1401) Pt will go Supine/Side to Sit:  with moderate assist  with minimal assist Goal: Patient Will Transfer Sit To/From Stand Outcome: Progressing Flowsheets (Taken 11/02/2022 1401) Patient will transfer sit to/from stand: with moderate assist Goal: Pt Will Transfer Bed To Chair/Chair To Bed Outcome: Progressing Flowsheets (Taken 11/02/2022 1401) Pt will Transfer Bed to Chair/Chair to Bed: with mod assist Goal: Pt Will Ambulate Outcome: Progressing Flowsheets (Taken 11/02/2022 1401) Pt will Ambulate:  15 feet  with moderate assist  with rolling walker   2:01 PM, 11/02/22 Lonell Grandchild, MPT Physical Therapist with [facility] [phone] office [phone] mobile phone

## 2022-11-02 NOTE — Progress Notes (Signed)
Awaiting PT eval.  

## 2022-11-02 NOTE — NC FL2 (Signed)
[place] LEVEL OF CARE SCREENING TOOL     IDENTIFICATION  Patient Name: Tracey Bryant Birthdate: [birth year] Sex: female Admission Date (Current Location): 11/01/2022  [facility] and [place] Number:  [employer] and Address:  [facility] [address] [place]      Provider Number: [id number]  Attending Physician Name and Address:  Default, Provider, MD  Relative Name and Phone Number:  [name] (son)- [phone]    Current Level of Care: Hospital Recommended Level of Care: [place] Prior Approval Number:    Date Approved/Denied:   PASRR Number: [id number]  Discharge Plan: SNF    Current Diagnoses: Patient Active Problem List   Diagnosis Date Noted   Seizures ([place]) 07/14/2022   Syncope 99/35/7017   Acute diastolic HF (heart failure) ([place]) 06/27/2022   Acute CVA (cerebrovascular accident) ([place]) 06/09/2022   Dizziness 05/25/2022   History of CVA (cerebrovascular accident) 05/22/2022   Physical deconditioning 05/22/2022   Acute on chronic congestive heart failure ([place]) 05/20/2022   Acute on chronic systolic CHF (congestive heart failure) ([place]) 05/19/2022   Acute respiratory failure with hypoxia ([place]) 12/26/2021   Elevated troponin 12/26/2021   Elevated brain natriuretic peptide (BNP) level 12/26/2021   Hypoalbuminemia due to protein-calorie malnutrition ([place]) 12/26/2021   Acute renal failure superimposed on stage 4 chronic kidney disease ([place]) 12/26/2021   Acute exacerbation of CHF (congestive heart failure) ([place]) 12/25/2021   Acute on chronic HFrEF (heart failure with reduced ejection fraction) ([place]) 07/27/2021   Acute on chronic combined systolic and diastolic congestive heart failure ([place]) 07/26/2021   Paroxysmal atrial fibrillation ([place]) 07/26/2021   DVT (deep venous thrombosis) (HCC) 07/26/2021   Normocytic anemia 07/26/2021   Cholecystostomy tube dysfunction 07/26/2021   Moderate protein-calorie  malnutrition ([place]) 07/26/2021   Posterior cerebral artery embolism, right    Severe sepsis ([place]) 03/19/2021   Cholecystitis, chronic 03/19/2021   Acute lower UTI 03/19/2021   Cholelithiasis without cholangitis 03/18/2021   Uncontrolled type 2 diabetes mellitus with hyperglycemia, with long-term current use of insulin ([place]) 03/18/2021   DKA (diabetic ketoacidosis) ([place]) 03/16/2021   AKI (acute kidney injury) ([place]) 03/16/2021   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   Acute posthemorrhagic anemia 03/16/2021   Epistaxis 03/16/2021   Chronic anticoagulation 03/16/2021   CVA (cerebral vascular accident) ([place]) 03/16/2021   Rheumatoid arteritis ([place]) 02/15/2013   Type II diabetes mellitus ([place]) 02/15/2013   Hypertension 02/15/2013   Gall bladder stones 02/15/2013   Multiple lung nodules 02/15/2013    Orientation RESPIRATION BLADDER Height & Weight     Self, Time, Situation, Place  Normal Continent Weight: 150 lb (68 kg) Height:  '5\' 6"'$  (167.6 cm)  BEHAVIORAL SYMPTOMS/MOOD NEUROLOGICAL BOWEL NUTRITION STATUS      Continent Diet (Regular)  AMBULATORY STATUS COMMUNICATION OF NEEDS Skin   Total Care Verbally Normal                       Personal Care Assistance Level of Assistance  Bathing, Feeding, Dressing Bathing Assistance: Maximum assistance Feeding assistance: Independent Dressing Assistance: Maximum assistance     Functional Limitations Info  Sight, Hearing, Speech Sight Info: Adequate Hearing Info: Adequate Speech Info: Adequate    SPECIAL CARE FACTORS FREQUENCY  PT (By licensed PT), OT (By licensed OT)     PT Frequency: x5/week OT Frequency: x5/week            Contractures Contractures Info: Not present  Additional Factors Info  Code Status, Allergies, Psychotropic Code Status Info: Full Allergies Info: None Psychotropic Info: None         Current Medications (11/02/2022):  This is the current hospital active medication  list Current Facility-Administered Medications  Medication Dose Route Frequency Provider Last Rate Last Admin   apixaban (ELIQUIS) tablet 5 mg  5 mg Oral BID Godfrey Pick, MD   5 mg at 11/02/22 0930   carvedilol (COREG) tablet 3.125 mg  3.125 mg Oral BID WC Godfrey Pick, MD   3.125 mg at 11/02/22 0715   insulin aspart (novoLOG) injection 0-5 Units  0-5 Units Subcutaneous QHS Godfrey Pick, MD       insulin aspart (novoLOG) injection 0-9 Units  0-9 Units Subcutaneous TID WC Godfrey Pick, MD   2 Units at 11/02/22 1332   isosorbide mononitrate (IMDUR) 24 hr tablet 15 mg  15 mg Oral Daily Godfrey Pick, MD   15 mg at 11/02/22 0930   oxyCODONE-acetaminophen (PERCOCET/ROXICET) 5-325 MG per tablet 1 tablet  1 tablet Oral Q3H PRN Godfrey Pick, MD   1 tablet at 11/02/22 1335   polyethylene glycol (MIRALAX / GLYCOLAX) packet 17 g  17 g Oral Daily Godfrey Pick, MD       Current Outpatient Medications  Medication Sig Dispense Refill   apixaban (ELIQUIS) 5 MG TABS tablet Take 1 tablet (5 mg total) by mouth 2 (two) times daily. 60 tablet 0   carvedilol (COREG) 3.125 MG tablet Take 1 tablet (3.125 mg total) by mouth 2 (two) times daily with a meal.     divalproex (DEPAKOTE) 500 MG DR tablet Take 500 mg by mouth 2 (two) times daily.     insulin aspart (NOVOLOG) 100 UNIT/ML injection Inject 3 Units into the skin 3 (three) times daily with meals. 10 mL 11   insulin glargine-yfgn (SEMGLEE) 100 UNIT/ML injection Inject 0.1 mLs (10 Units total) into the skin daily. 10 mL 11   isosorbide mononitrate (IMDUR) 30 MG 24 hr tablet Take 0.5 tablets (15 mg total) by mouth daily.     Multiple Vitamin (MULTIVITAMIN WITH MINERALS) TABS tablet Take 1 tablet by mouth daily.     oxyCODONE (OXY IR/ROXICODONE) 5 MG immediate release tablet Take 5 mg by mouth every 6 (six) hours as needed for moderate pain.     polyethylene glycol (MIRALAX / GLYCOLAX) 17 g packet Take 17 g by mouth daily as needed for mild constipation. 14 each 0    QUEtiapine (SEROQUEL) 25 MG tablet Take 25 mg by mouth at bedtime.     glipiZIDE (GLUCOTROL XL) 5 MG 24 hr tablet Take 5 mg by mouth daily. (Patient not taking: Reported on 11/02/2022)       Discharge Medications: Please see discharge summary for a list of discharge medications.  Relevant Imaging Results:  Relevant Lab Results:   Additional Information SSN: [id number]  Kimber Relic, LCSW

## 2022-11-02 NOTE — Progress Notes (Signed)
This CSW spoke with the pt's son, [name], who states he would like for his mom to go to Rehab but was under the impression that insurance benefits has been exhausted for the remainder of the year. [name] reports his preferred facility is [facility]. This CSW has faxed the pt out. This CSW informed [name] that we submit for insurance Auth prior to d/c to SNF and we will see if the pt is approved.

## 2022-11-02 NOTE — ED Notes (Signed)
Lunch tray given. 

## 2022-11-02 NOTE — Progress Notes (Signed)
CSW spoke to the son as the mother was accepted into the [facility]. TOC is awaiting information on INS AUTH. TOC will continue to follow.

## 2022-11-02 NOTE — ED Notes (Signed)
Breakfast tray given. °

## 2022-11-03 LAB — CBG MONITORING, ED: Glucose-Capillary: 126 mg/dL — ABNORMAL HIGH (ref 70–99)

## 2022-11-03 MED ORDER — OXYCODONE HCL 5 MG PO TABS: 5.0000 mg | ORAL_TABLET | Freq: Four times a day (QID) | ORAL | 0 refills | Status: AC | PRN

## 2022-11-03 NOTE — ED Notes (Signed)
Tried to call report to [facility] was on hold for 4 mins then phone rang and rang hung up after

## 2022-11-03 NOTE — Discharge Instructions (Addendum)
Further pain control should be arranged by the physicians at the skilled nursing facility.  A short course of Roxicodone was prescribed with a printed prescription at the time of transfer from the ED.

## 2022-11-03 NOTE — ED Notes (Signed)
Vitals taken, report given and patient stable EMS taking to [facility].

## 2022-11-03 NOTE — ED Notes (Signed)
Called report to [place] at the [facility] at [phone]. Waiting on EMS for transport.

## 2022-11-03 NOTE — ED Notes (Signed)
Biliary drain emptied. 331m

## 2022-11-03 NOTE — ED Notes (Signed)
CSW updated that pts insurance Tracey Bryant has been approved for SNF at [facility]. CSW spoke to [place] in admissions at the facility who provided room and report numbers and states they are ready for pt today. CSW updated MD and RN. CSW completed updated Fl2 for facility as well. CSW spoke with pts son to provide update and pan for D/C. He is agreeable to this.

## 2022-11-03 NOTE — ED Notes (Signed)
Pt was given breakfast tray 

## 2022-11-03 NOTE — ED Provider Notes (Signed)
Emergency Medicine Observation Re-evaluation Note  Tracey Bryant is a 71 y.o. female, seen on rounds today.  Pt initially presented to the ED for complaints of Pain Currently, the patient is resting  Physical Exam  BP 134/77   Pulse 75   Temp 98.1 F (36.7 C) (Oral)   Resp 10   Ht '5\' 6"'$  (1.676 m)   Wt 68 kg   SpO2 96%   BMI 24.21 kg/m  Physical Exam General: NAD Cardiac: Regular heart rate Lungs: No respiratory distress Psych: Stable  ED Course / MDM  EKG:EKG Interpretation  Date/Time:  Monday November 01 2022 17:04:22 EST Ventricular Rate:  78 PR Interval:  200 QRS Duration: 107 QT Interval:  417 QTC Calculation: 475 R Axis:   83 Text Interpretation: Sinus rhythm Left atrial enlargement Borderline right axis deviation Borderline low voltage, extremity leads LVH with secondary repolarization abnormality Confirmed by Godfrey Pick [phone]) on 11/01/2022 6:30:14 PM  I have reviewed the labs performed to date as well as medications administered while in observation.   Plan  Current plan is for rehab placement - TOC assisting.    Wyvonnia Dusky, MD 11/03/22 [phone]

## 2022-11-03 NOTE — ED Provider Notes (Signed)
Patient is reassessed at the time of discharge and stable for discharge to [facility].     Wyvonnia Dusky, MD 11/03/22 1021

## 2022-11-03 NOTE — NC FL2 (Signed)
[place] LEVEL OF CARE SCREENING TOOL     IDENTIFICATION  Patient Name: Tracey Bryant Birthdate: 1951/11/26 Sex: female Admission Date (Current Location): 11/01/2022  [facility] and [place] Number:  [employer] and Address:  [facility] [address] [place]      Provider Number: [id number]  Attending Physician Name and Address:  Default, Provider, MD  Relative Name and Phone Number:  [name] (son)- [phone]    Current Level of Care: Hospital Recommended Level of Care: [place] Prior Approval Number:    Date Approved/Denied:   PASRR Number: [id number]  Discharge Plan: SNF    Current Diagnoses: Patient Active Problem List   Diagnosis Date Noted   Seizures ([place]) 07/14/2022   Syncope 01/75/1025   Acute diastolic HF (heart failure) ([place]) 06/27/2022   Acute CVA (cerebrovascular accident) ([place]) 06/09/2022   Dizziness 05/25/2022   History of CVA (cerebrovascular accident) 05/22/2022   Physical deconditioning 05/22/2022   Acute on chronic congestive heart failure ([place]) 05/20/2022   Acute on chronic systolic CHF (congestive heart failure) ([place]) 05/19/2022   Acute respiratory failure with hypoxia ([place]) 12/26/2021   Elevated troponin 12/26/2021   Elevated brain natriuretic peptide (BNP) level 12/26/2021   Hypoalbuminemia due to protein-calorie malnutrition ([place]) 12/26/2021   Acute renal failure superimposed on stage 4 chronic kidney disease ([place]) 12/26/2021   Acute exacerbation of CHF (congestive heart failure) ([place]) 12/25/2021   Acute on chronic HFrEF (heart failure with reduced ejection fraction) ([place]) 07/27/2021   Acute on chronic combined systolic and diastolic congestive heart failure ([place]) 07/26/2021   Paroxysmal atrial fibrillation ([place]) 07/26/2021   DVT (deep venous thrombosis) (HCC) 07/26/2021   Normocytic anemia 07/26/2021   Cholecystostomy tube dysfunction 07/26/2021   Moderate protein-calorie  malnutrition ([place]) 07/26/2021   Posterior cerebral artery embolism, right    Severe sepsis ([place]) 03/19/2021   Cholecystitis, chronic 03/19/2021   Acute lower UTI 03/19/2021   Cholelithiasis without cholangitis 03/18/2021   Uncontrolled type 2 diabetes mellitus with hyperglycemia, with long-term current use of insulin ([place]) 03/18/2021   DKA (diabetic ketoacidosis) (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   Acute posthemorrhagic anemia 03/16/2021   Epistaxis 03/16/2021   Chronic anticoagulation 03/16/2021   CVA (cerebral vascular accident) ([place]) 03/16/2021   Rheumatoid arteritis ([place]) 02/15/2013   Type II diabetes mellitus ([place]) 02/15/2013   Hypertension 02/15/2013   Gall bladder stones 02/15/2013   Multiple lung nodules 02/15/2013    Orientation RESPIRATION BLADDER Height & Weight     Self, Time, Situation, Place  Normal Continent Weight: 150 lb (68 kg) Height:  '5\' 6"'$  (167.6 cm)  BEHAVIORAL SYMPTOMS/MOOD NEUROLOGICAL BOWEL NUTRITION STATUS      Continent Diet (Regular)  AMBULATORY STATUS COMMUNICATION OF NEEDS Skin   Total Care Verbally Normal                       Personal Care Assistance Level of Assistance  Bathing, Feeding, Dressing Bathing Assistance: Maximum assistance Feeding assistance: Independent Dressing Assistance: Maximum assistance     Functional Limitations Info  Sight, Hearing, Speech Sight Info: Adequate Hearing Info: Adequate Speech Info: Adequate    SPECIAL CARE FACTORS FREQUENCY  PT (By licensed PT), OT (By licensed OT)     PT Frequency: x5/week OT Frequency: x5/week            Contractures Contractures Info: Not present  Additional Factors Info  Code Status, Allergies, Psychotropic Code Status Info: Full Allergies Info: None Psychotropic Info: None         Current Medications (11/03/2022):  This is the current hospital active medication  list Current Facility-Administered Medications  Medication Dose Route Frequency Provider Last Rate Last Admin   apixaban (ELIQUIS) tablet 5 mg  5 mg Oral BID Godfrey Pick, MD   5 mg at 11/02/22 2144   carvedilol (COREG) tablet 3.125 mg  3.125 mg Oral BID WC Godfrey Pick, MD   3.125 mg at 11/03/22 0757   insulin aspart (novoLOG) injection 0-5 Units  0-5 Units Subcutaneous QHS Godfrey Pick, MD       insulin aspart (novoLOG) injection 0-9 Units  0-9 Units Subcutaneous TID WC Godfrey Pick, MD   1 Units at 11/03/22 0831   isosorbide mononitrate (IMDUR) 24 hr tablet 15 mg  15 mg Oral Daily Godfrey Pick, MD   15 mg at 11/02/22 0930   oxyCODONE-acetaminophen (PERCOCET/ROXICET) 5-325 MG per tablet 1 tablet  1 tablet Oral Q3H PRN Godfrey Pick, MD   1 tablet at 11/03/22 0757   polyethylene glycol (MIRALAX / GLYCOLAX) packet 17 g  17 g Oral Daily Godfrey Pick, MD       Current Outpatient Medications  Medication Sig Dispense Refill   apixaban (ELIQUIS) 5 MG TABS tablet Take 1 tablet (5 mg total) by mouth 2 (two) times daily. 60 tablet 0   carvedilol (COREG) 3.125 MG tablet Take 1 tablet (3.125 mg total) by mouth 2 (two) times daily with a meal.     divalproex (DEPAKOTE) 500 MG DR tablet Take 500 mg by mouth 2 (two) times daily.     insulin aspart (NOVOLOG) 100 UNIT/ML injection Inject 3 Units into the skin 3 (three) times daily with meals. 10 mL 11   insulin glargine-yfgn (SEMGLEE) 100 UNIT/ML injection Inject 0.1 mLs (10 Units total) into the skin daily. 10 mL 11   isosorbide mononitrate (IMDUR) 30 MG 24 hr tablet Take 0.5 tablets (15 mg total) by mouth daily.     Multiple Vitamin (MULTIVITAMIN WITH MINERALS) TABS tablet Take 1 tablet by mouth daily.     oxyCODONE (OXY IR/ROXICODONE) 5 MG immediate release tablet Take 5 mg by mouth every 6 (six) hours as needed for moderate pain.     polyethylene glycol (MIRALAX / GLYCOLAX) 17 g packet Take 17 g by mouth daily as needed for mild constipation. 14 each 0    QUEtiapine (SEROQUEL) 25 MG tablet Take 25 mg by mouth at bedtime.     glipiZIDE (GLUCOTROL XL) 5 MG 24 hr tablet Take 5 mg by mouth daily. (Patient not taking: Reported on 11/02/2022)       Discharge Medications: Please see after visit summary for a list of discharge medications.  Relevant Imaging Results:  Relevant Lab Results:   Additional Information SSN: [id number]  Iona Beard, [place]

## 2022-11-03 NOTE — ED Notes (Signed)
Called for transport to the [facility]

## 2023-01-11 ENCOUNTER — Inpatient Hospital Stay
Admission: EM | Admit: 2023-01-11 | Discharge: 2023-01-23 | DRG: 690 | Disposition: A | Discharge status: Home Health | Payer: Medicare HMO | Attending: Internal Medicine | Admitting: Internal Medicine

## 2023-01-11 ENCOUNTER — Emergency Department: Payer: Medicare HMO

## 2023-01-11 DIAGNOSIS — N179 Acute kidney failure, unspecified: Secondary | ICD-10-CM | POA: Diagnosis not present

## 2023-01-11 DIAGNOSIS — H5340 Unspecified visual field defects: Secondary | ICD-10-CM | POA: Diagnosis present

## 2023-01-11 DIAGNOSIS — E2740 Unspecified adrenocortical insufficiency: Secondary | ICD-10-CM | POA: Diagnosis not present

## 2023-01-11 DIAGNOSIS — D509 Iron deficiency anemia, unspecified: Secondary | ICD-10-CM | POA: Diagnosis present

## 2023-01-11 DIAGNOSIS — M1990 Unspecified osteoarthritis, unspecified site: Secondary | ICD-10-CM | POA: Diagnosis present

## 2023-01-11 DIAGNOSIS — N3000 Acute cystitis without hematuria: Secondary | ICD-10-CM | POA: Diagnosis not present

## 2023-01-11 DIAGNOSIS — Z809 Family history of malignant neoplasm, unspecified: Secondary | ICD-10-CM

## 2023-01-11 DIAGNOSIS — R569 Unspecified convulsions: Secondary | ICD-10-CM | POA: Diagnosis not present

## 2023-01-11 DIAGNOSIS — E1122 Type 2 diabetes mellitus with diabetic chronic kidney disease: Secondary | ICD-10-CM | POA: Diagnosis present

## 2023-01-11 DIAGNOSIS — E039 Hypothyroidism, unspecified: Secondary | ICD-10-CM | POA: Diagnosis not present

## 2023-01-11 DIAGNOSIS — I480 Paroxysmal atrial fibrillation: Secondary | ICD-10-CM | POA: Diagnosis not present

## 2023-01-11 DIAGNOSIS — D508 Other iron deficiency anemias: Secondary | ICD-10-CM | POA: Diagnosis not present

## 2023-01-11 DIAGNOSIS — L89620 Pressure ulcer of left heel, unstageable: Secondary | ICD-10-CM | POA: Diagnosis present

## 2023-01-11 DIAGNOSIS — Z8673 Personal history of transient ischemic attack (TIA), and cerebral infarction without residual deficits: Secondary | ICD-10-CM | POA: Diagnosis not present

## 2023-01-11 DIAGNOSIS — D631 Anemia in chronic kidney disease: Secondary | ICD-10-CM | POA: Diagnosis present

## 2023-01-11 DIAGNOSIS — B9620 Unspecified Escherichia coli [E. coli] as the cause of diseases classified elsewhere: Secondary | ICD-10-CM | POA: Diagnosis present

## 2023-01-11 DIAGNOSIS — E11649 Type 2 diabetes mellitus with hypoglycemia without coma: Secondary | ICD-10-CM | POA: Diagnosis not present

## 2023-01-11 DIAGNOSIS — E876 Hypokalemia: Secondary | ICD-10-CM | POA: Diagnosis present

## 2023-01-11 DIAGNOSIS — H8112 Benign paroxysmal vertigo, left ear: Secondary | ICD-10-CM | POA: Diagnosis not present

## 2023-01-11 DIAGNOSIS — R4781 Slurred speech: Secondary | ICD-10-CM | POA: Diagnosis not present

## 2023-01-11 DIAGNOSIS — H8110 Benign paroxysmal vertigo, unspecified ear: Secondary | ICD-10-CM | POA: Diagnosis present

## 2023-01-11 DIAGNOSIS — R040 Epistaxis: Secondary | ICD-10-CM | POA: Diagnosis not present

## 2023-01-11 DIAGNOSIS — N3001 Acute cystitis with hematuria: Secondary | ICD-10-CM | POA: Diagnosis present

## 2023-01-11 DIAGNOSIS — K811 Chronic cholecystitis: Secondary | ICD-10-CM | POA: Diagnosis not present

## 2023-01-11 DIAGNOSIS — I69398 Other sequelae of cerebral infarction: Secondary | ICD-10-CM

## 2023-01-11 DIAGNOSIS — D500 Iron deficiency anemia secondary to blood loss (chronic): Secondary | ICD-10-CM | POA: Diagnosis not present

## 2023-01-11 DIAGNOSIS — Z789 Other specified health status: Secondary | ICD-10-CM | POA: Diagnosis present

## 2023-01-11 DIAGNOSIS — I5022 Chronic systolic (congestive) heart failure: Secondary | ICD-10-CM | POA: Diagnosis present

## 2023-01-11 DIAGNOSIS — E1165 Type 2 diabetes mellitus with hyperglycemia: Secondary | ICD-10-CM | POA: Diagnosis present

## 2023-01-11 DIAGNOSIS — N184 Chronic kidney disease, stage 4 (severe): Secondary | ICD-10-CM | POA: Diagnosis present

## 2023-01-11 DIAGNOSIS — Z531 Procedure and treatment not carried out because of patient's decision for reasons of belief and group pressure: Secondary | ICD-10-CM | POA: Diagnosis present

## 2023-01-11 DIAGNOSIS — M069 Rheumatoid arthritis, unspecified: Secondary | ICD-10-CM | POA: Diagnosis present

## 2023-01-11 DIAGNOSIS — Z7901 Long term (current) use of anticoagulants: Secondary | ICD-10-CM | POA: Diagnosis not present

## 2023-01-11 DIAGNOSIS — Z86718 Personal history of other venous thrombosis and embolism: Secondary | ICD-10-CM | POA: Diagnosis not present

## 2023-01-11 DIAGNOSIS — Z515 Encounter for palliative care: Secondary | ICD-10-CM

## 2023-01-11 DIAGNOSIS — Z8249 Family history of ischemic heart disease and other diseases of the circulatory system: Secondary | ICD-10-CM

## 2023-01-11 DIAGNOSIS — R55 Syncope and collapse: Principal | ICD-10-CM

## 2023-01-11 DIAGNOSIS — I10 Essential (primary) hypertension: Secondary | ICD-10-CM | POA: Diagnosis present

## 2023-01-11 DIAGNOSIS — N189 Chronic kidney disease, unspecified: Secondary | ICD-10-CM | POA: Diagnosis not present

## 2023-01-11 DIAGNOSIS — Z833 Family history of diabetes mellitus: Secondary | ICD-10-CM

## 2023-01-11 DIAGNOSIS — I130 Hypertensive heart and chronic kidney disease with heart failure and stage 1 through stage 4 chronic kidney disease, or unspecified chronic kidney disease: Secondary | ICD-10-CM | POA: Diagnosis present

## 2023-01-11 DIAGNOSIS — F458 Other somatoform disorders: Secondary | ICD-10-CM | POA: Diagnosis present

## 2023-01-11 LAB — BASIC METABOLIC PANEL
Anion gap: 7 (ref 5–15)
BUN: 49 mg/dL — ABNORMAL HIGH (ref 8–23)
CO2: 21 mmol/L — ABNORMAL LOW (ref 22–32)
Calcium: 7.9 mg/dL — ABNORMAL LOW (ref 8.9–10.3)
Chloride: 109 mmol/L (ref 98–111)
Creatinine, Ser: 2.23 mg/dL — ABNORMAL HIGH (ref 0.44–1.00)
GFR, Estimated: 23 mL/min — ABNORMAL LOW (ref >60)
Glucose, Bld: 60 mg/dL — ABNORMAL LOW (ref 70–99)
Potassium: 3.3 mmol/L — ABNORMAL LOW (ref 3.5–5.1)
Sodium: 137 mmol/L (ref 135–145)

## 2023-01-11 LAB — CBC WITH DIFFERENTIAL/PLATELET
Abs Immature Granulocytes: 0.02 10*3/uL (ref 0.00–0.07)
Basophils Absolute: 0 10*3/uL (ref 0.0–0.1)
Basophils Relative: 1 %
Eosinophils Absolute: 0.3 10*3/uL (ref 0.0–0.5)
Eosinophils Relative: 6 %
HCT: 29.7 % — ABNORMAL LOW (ref 36.0–46.0)
Hemoglobin: 9.3 g/dL — ABNORMAL LOW (ref 12.0–15.0)
Immature Granulocytes: 0 %
Lymphocytes Relative: 25 %
Lymphs Abs: 1.3 10*3/uL (ref 0.7–4.0)
MCH: 30.2 pg (ref 26.0–34.0)
MCHC: 31.3 g/dL (ref 30.0–36.0)
MCV: 96.4 fL (ref 80.0–100.0)
Monocytes Absolute: 0.4 10*3/uL (ref 0.1–1.0)
Monocytes Relative: 8 %
Neutro Abs: 3.2 10*3/uL (ref 1.7–7.7)
Neutrophils Relative %: 60 %
Platelets: 305 10*3/uL (ref 150–400)
RBC: 3.08 MIL/uL — ABNORMAL LOW (ref 3.87–5.11)
RDW: 15.8 % — ABNORMAL HIGH (ref 11.5–15.5)
WBC: 5.4 10*3/uL (ref 4.0–10.5)
nRBC: 0 % (ref 0.0–0.2)

## 2023-01-11 LAB — URINALYSIS, ROUTINE W REFLEX MICROSCOPIC
Bilirubin Urine: NEGATIVE
Glucose, UA: NEGATIVE mg/dL
Ketones, ur: NEGATIVE mg/dL
Nitrite: POSITIVE — AB
Protein, ur: >=300 mg/dL — AB
Specific Gravity, Urine: 1.012 (ref 1.005–1.030)
pH: 6 (ref 5.0–8.0)

## 2023-01-11 LAB — CBG MONITORING, ED: Glucose-Capillary: 63 mg/dL — ABNORMAL LOW (ref 70–99)

## 2023-01-11 LAB — TROPONIN I (HIGH SENSITIVITY)
Troponin I (High Sensitivity): 14 ng/L (ref <18)
Troponin I (High Sensitivity): 16 ng/L (ref <18)

## 2023-01-11 MED ORDER — SODIUM CHLORIDE 0.9 % IV SOLN
1.0000 g | Freq: Once | INTRAVENOUS | Status: AC
  Administered 2023-01-11: 1 g via INTRAVENOUS
  Filled 2023-01-11: qty 10

## 2023-01-11 MED ORDER — SODIUM CHLORIDE 0.9 % IV SOLN
1.0000 g | INTRAVENOUS | Status: DC
  Administered 2023-01-12 – 2023-01-14 (×3): 1 g via INTRAVENOUS
  Filled 2023-01-11 (×3): qty 10

## 2023-01-11 MED ORDER — SODIUM CHLORIDE 0.9 % IV BOLUS: 1000.0000 mL | Freq: Once | INTRAVENOUS | Status: DC

## 2023-01-11 MED ORDER — SODIUM CHLORIDE 0.9 % IV BOLUS
500.0000 mL | Freq: Once | INTRAVENOUS | Status: AC
  Administered 2023-01-11: 500 mL via INTRAVENOUS

## 2023-01-11 MED ORDER — POTASSIUM CHLORIDE CRYS ER 20 MEQ PO TBCR
40.0000 meq | EXTENDED_RELEASE_TABLET | Freq: Once | ORAL | Status: AC
  Administered 2023-01-11: 40 meq via ORAL
  Filled 2023-01-11: qty 2

## 2023-01-11 NOTE — ED Notes (Signed)
Patient given sandwich box and juice.

## 2023-01-11 NOTE — H&P (Addendum)
History and Physical    Tracey Bryant JJK:[id number] DOB: [birth year] DOA: 01/11/2023  PCP: Coolidge Breeze, FNP  Patient coming from: rheumatology office   I have personally briefly reviewed patient's old medical records in [place]  Chief Complaint: recurrent syncope   HPI: Tracey Bryant is a 72 y.o. female with medical history significant of  CHF pef , DMII, DVT, HTN , Pafib on Eliquis, CKDIIIb +PPM,h/o CVA x 3, hx of cholecystitis now with chronic biliary drain,history of recurrent syncope followed by neuro who presents to ED in referral from rheumatology clinic s/p recurrent episode of syncope. Patient states she had these episode for over a year and noted that these episodes are not frequent and occur every 4-6 months. She state she does get an aura prior to even and that during even she states she is partially aware but too weak to speak. She states that after even she feels severely fatigued for 5-10 mins and thereafter back to her baseline.  With episodes no she noted on chest pain , n/v/d/ dysuria fever/chills. Or shortness of breath. Of note patient has similar admission 7/18 and at that time evaluation was unrevealing and it was thought that these episodes were stress induced.   ED Course:  EKG: atrial paced Afeb, bp 176/95, hr 78 , rr 18  sat 97% on ra  CTH: NAD Wbc: 5.4, Hg 9.3 Na 137, K 3.3 bicarb 21, cr 2.23 (at baseline) CE :14,16 Gluc 63,104 UA + bacteria, _ wbc 21-50,+ nitrite  CT 1. Bilateral trace pleural effusions with pleural thickening on the right. Right empyema not excluded. 2. Contracted gallbladder with percutaneous cholecystostomy pigtail terminating likely within the gallbladder lumen. 3. Trace simple free fluid ascites. 4. Colonic diverticulosis with no acute diverticulitis. 5. Mild hepatomegaly. 6. Persistent well-defined 2 cm left hip subcutaneus soft tissue density. Query seroma. 7.  Aortic Atherosclerosis  Tx ctx Review of Systems:  As per HPI otherwise 10 point review of systems negative.   Past Medical History:  Diagnosis Date   Arthritis    Chronic systolic (congestive) heart failure (HCC)    Diabetes mellitus without complication ([place])    DVT (deep venous thrombosis) ([place]) 07/26/2021   Hypertension    Paroxysmal atrial fibrillation ([place]) 07/26/2021   Stroke [facility])     Past Surgical History:  Procedure Laterality Date   breast tumor     HIP FRACTURE SURGERY Left    IR CATHETER TUBE CHANGE  09/25/2021   IR CHOLANGIOGRAM EXISTING TUBE  05/13/2021   IR EXCHANGE BILIARY DRAIN  07/27/2021   IR EXCHANGE BILIARY DRAIN  11/04/2021   IR EXCHANGE BILIARY DRAIN  01/08/2022   IR EXCHANGE BILIARY DRAIN  05/31/2022   IR EXCHANGE BILIARY DRAIN  06/12/2022   IR RADIOLOGIST EVAL & MGMT  09/04/2021   IR REMOVAL OF CALCULI/DEBRIS BILIARY DUCT/GB  09/25/2021   IR REMOVAL OF CALCULI/DEBRIS BILIARY DUCT/GB  11/04/2021   IR REMOVAL OF CALCULI/DEBRIS BILIARY DUCT/GB  01/08/2022   IR THORACENTESIS ASP PLEURAL SPACE W/IMG GUIDE  05/28/2022   PACEMAKER IMPLANT     TONSILLECTOMY       reports that she has never smoked. She has never used smokeless tobacco. She reports that she does not drink alcohol and does not use drugs.  No Known Allergies  Family History  Problem Relation Age of Onset   Cancer Mother    Diabetes Mother    Heart disease Father     Prior to Admission medications  Medication Sig Start Date End Date Taking? Authorizing Provider  amLODipine (NORVASC) 10 MG tablet Take 10 mg by mouth daily.   Yes [provider]  apixaban (ELIQUIS) 5 MG TABS tablet Take 1 tablet (5 mg total) by mouth 2 (two) times daily. 08/04/21  Yes Pattricia Boss, MD  carvedilol (COREG) 3.125 MG tablet Take 1 tablet (3.125 mg total) by mouth 2 (two) times daily with a meal. 06/09/22  Yes Samella Parr, NP  divalproex (DEPAKOTE) 500 MG DR tablet Take 500 mg by mouth 2 (two) times daily. 10/14/22  Yes [provider]   docusate sodium (COLACE) 100 MG capsule Take 100 mg by mouth daily as needed for mild constipation.   Yes [provider]  furosemide (LASIX) 20 MG tablet Take 5 mg by mouth daily.   Yes [provider]  glipiZIDE (GLUCOTROL XL) 5 MG 24 hr tablet Take 5 mg by mouth daily. 06/24/22  Yes [provider]  isosorbide mononitrate (IMDUR) 30 MG 24 hr tablet Take 15 mg by mouth daily.   Yes [provider]  QUEtiapine (SEROQUEL) 25 MG tablet Take 25 mg by mouth at bedtime. 10/25/22  Yes [provider]  hydroxychloroquine (PLAQUENIL) 200 MG tablet Take 200 mg by mouth daily. Patient not taking: Reported on 01/11/2023 01/11/23 04/11/23  [provider]  oxyCODONE (ROXICODONE) 5 MG immediate release tablet Take 1 tablet (5 mg total) by mouth every 6 (six) hours as needed for up to 15 doses for severe pain. 11/03/22   Wyvonnia Dusky, MD  sulfaSALAzine (AZULFIDINE) 500 MG EC tablet Take 500 mg by mouth as directed. Take 500 mg daily for 14 days and then 500 mg bid Patient not taking: Reported on 01/11/2023 01/11/23 04/11/23  [provider]    Physical Exam: Vitals:   01/11/23 2045 01/11/23 2130 01/11/23 2206 01/11/23 2330  BP: (!) 184/91 (!) 183/93 (!) 183/89 (!) 175/92  Pulse: 76 73 84 77  Resp: '16 15 13 17  '$ Temp:   98.5 F (36.9 C)   TempSrc:   Oral   SpO2: 99% 97% 100% 95%  Weight:        Constitutional: NAD, calm, comfortable Vitals:   01/11/23 2045 01/11/23 2130 01/11/23 2206 01/11/23 2330  BP: (!) 184/91 (!) 183/93 (!) 183/89 (!) 175/92  Pulse: 76 73 84 77  Resp: '16 15 13 17  '$ Temp:   98.5 F (36.9 C)   TempSrc:   Oral   SpO2: 99% 97% 100% 95%  Weight:       Eyes: PERRL, lids and conjunctivae normal ENMT: Mucous membranes are moist. Posterior pharynx clear of any exudate or lesions.Normal dentition.  Neck: normal, supple, no masses, no thyromegaly Respiratory: clear to auscultation bilaterally, no wheezing, no crackles.  Normal respiratory effort. No accessory muscle use.  Cardiovascular: Regular rate and rhythm, no murmurs / rubs / gallops. +extremity edema. /chronic venostasis  changes,warn lower extremities  Abdomen: no tenderness, no masses palpated. No hepatosplenomegaly. Bowel sounds positive.  Musculoskeletal: no clubbing / cyanosis. No joint deformity upper and lower extremities. Good ROM, no contractures. Normal muscle tone.  Skin: no rashes, lesions, ulcers. No induration Neurologic: CN 2-12 grossly intact. Sensation intact,  MAE x4 Psychiatric: Normal judgment and insight. Alert and oriented x 3. Normal mood.    Labs on Admission: I have personally reviewed following labs and imaging studies  CBC: Recent Labs  Lab 01/11/23 2005  WBC 5.4  NEUTROABS 3.2  HGB 9.3*  HCT  29.7*  MCV 96.4  PLT 938   Basic Metabolic Panel: Recent Labs  Lab 01/11/23 2005  NA 137  K 3.3*  CL 109  CO2 21*  GLUCOSE 60*  BUN 49*  CREATININE 2.23*  CALCIUM 7.9*   GFR: Estimated Creatinine Clearance: 25.4 mL/min (A) (by C-G formula based on SCr of 2.23 mg/dL (H)). Liver Function Tests: No results for input(s): "AST", "ALT", "ALKPHOS", "BILITOT", "PROT", "ALBUMIN" in the last 168 hours. No results for input(s): "LIPASE", "AMYLASE" in the last 168 hours. No results for input(s): "AMMONIA" in the last 168 hours. Coagulation Profile: No results for input(s): "INR", "PROTIME" in the last 168 hours. Cardiac Enzymes: No results for input(s): "CKTOTAL", "CKMB", "CKMBINDEX", "TROPONINI" in the last 168 hours. BNP (last 3 results) Recent Labs    05/04/22 1625  PROBNP 15,044*   HbA1C: No results for input(s): "HGBA1C" in the last 72 hours. CBG: Recent Labs  Lab 01/11/23 2005  GLUCAP 63*   Lipid Profile: No results for input(s): "CHOL", "HDL", "LDLCALC", "TRIG", "CHOLHDL", "LDLDIRECT" in the last 72 hours. Thyroid Function Tests: No results for input(s): "TSH", "T4TOTAL", "FREET4", "T3FREE", "THYROIDAB"  in the last 72 hours. Anemia Panel: No results for input(s): "VITAMINB12", "FOLATE", "FERRITIN", "TIBC", "IRON", "RETICCTPCT" in the last 72 hours. Urine analysis:    Component Value Date/Time   COLORURINE YELLOW (A) 01/11/2023 2210   APPEARANCEUR HAZY (A) 01/11/2023 2210   LABSPEC 1.012 01/11/2023 2210   PHURINE 6.0 01/11/2023 2210   GLUCOSEU NEGATIVE 01/11/2023 2210   HGBUR SMALL (A) 01/11/2023 2210   BILIRUBINUR NEGATIVE 01/11/2023 2210   KETONESUR NEGATIVE 01/11/2023 2210   PROTEINUR >=300 (A) 01/11/2023 2210   UROBILINOGEN 0.2 01/17/2013 1910   NITRITE POSITIVE (A) 01/11/2023 2210   LEUKOCYTESUR SMALL (A) 01/11/2023 2210    Radiological Exams on Admission: CT ABDOMEN PELVIS WO CONTRAST  Result Date: 01/11/2023 CLINICAL DATA:  Abdominal pain, acute, nonlocalized EXAM: CT ABDOMEN AND PELVIS WITHOUT CONTRAST TECHNIQUE: Multidetector CT imaging of the abdomen and pelvis was performed following the standard protocol without IV contrast. RADIATION DOSE REDUCTION: This exam was performed according to the departmental dose-optimization program which includes automated exposure control, adjustment of the mA and/or kV according to patient size and/or use of iterative reconstruction technique. COMPARISON:  CT abdomen pelvis 12/14/2022, temp pelvis 01/17/2013 FINDINGS: Lower chest: Bilateral trace pleural effusions with pleural thickening on the right. Partially visualize cardiac leads. Hepatobiliary: Mild hepatomegaly measuring up to 19 cm. No focal liver abnormality. Contracted gallbladder with percutaneous cholecystostomy pigtail terminating likely within the gallbladder lumen. No biliary dilatation. Pancreas: No focal lesion. Normal pancreatic contour. No surrounding inflammatory changes. No main pancreatic ductal dilatation. Spleen: Normal in size without focal abnormality. Adrenals/Urinary Tract: No adrenal nodule bilaterally. No nephrolithiasis and no hydronephrosis. 7 cm fluid density lesion  within the right kidney with thin peripheral calcifications likely representing a minimally complex cyst-no further follow-up indicated. No ureterolithiasis or hydroureter. The urinary bladder is unremarkable. Stomach/Bowel: Stomach is within normal limits. No evidence of bowel wall thickening or dilatation. Colonic diverticulosis. Appendix appears normal. Vascular/Lymphatic: No abdominal aorta or iliac aneurysm. Moderate atherosclerotic plaque of the aorta and its branches. No abdominal, pelvic, or inguinal lymphadenopathy. Reproductive: Uterus and bilateral adnexa are unremarkable. Other: Trace volume simple free fluid. No intraperitoneal free gas. No organized fluid collection. Musculoskeletal: Diffuse subcutaneus soft tissue edema. Persistent well-defined 2 cm left hip subcutaneus soft tissue density. Tiny fat containing umbilical hernia. No suspicious lytic or blastic osseous lesions. No acute displaced fracture.  Total left hip arthroplasty partially visualized. IMPRESSION: 1. Bilateral trace pleural effusions with pleural thickening on the right. Right empyema not excluded. 2. Contracted gallbladder with percutaneous cholecystostomy pigtail terminating likely within the gallbladder lumen. 3. Trace simple free fluid ascites. 4. Colonic diverticulosis with no acute diverticulitis. 5. Mild hepatomegaly. 6. Persistent well-defined 2 cm left hip subcutaneus soft tissue density. Query seroma. 7.  Aortic Atherosclerosis (ICD10-I70.0). Electronically Signed   By: Iven Finn M.D.   On: 01/11/2023 22:22   CT HEAD WO CONTRAST  Result Date: 01/11/2023 CLINICAL DATA:  Syncope/presyncope, cerebrovascular cause suspected syncope EXAM: CT HEAD WITHOUT CONTRAST TECHNIQUE: Contiguous axial images were obtained from the base of the skull through the vertex without intravenous contrast. RADIATION DOSE REDUCTION: This exam was performed according to the departmental dose-optimization program which includes automated  exposure control, adjustment of the mA and/or kV according to patient size and/or use of iterative reconstruction technique. COMPARISON:  CT head 07/13/2022. FINDINGS: Brain: No evidence of acute infarction, hemorrhage, hydrocephalus, extra-axial collection or mass lesion/mass effect. Similar chronic infarcts in the right frontal, parietal, posterior temporal and occipital lobes. Small remote left cerebellar infarct. Vascular: No hyperdense vessel. Skull: No acute fracture. Sinuses/Orbits: Largely clear sinuses.  No acute orbital findings. Other: No mastoid effusions. IMPRESSION: 1. No evidence of acute intracranial abnormality. 2. Chronic infarcts. Electronically Signed   By: Margaretha Sheffield M.D.   On: 01/11/2023 16:41    EKG: Independently reviewed.   Assessment/Plan  UTI  -admit to tele - continue with CTX -f/u on urine culture   Syncope -hx of stress induced syncope dx 7/18 admission  -acute on chronic thought to be due to UTI on base history of recurrent syncope  -f/u with syncope evaluation per protocol  -gently ivfs overnight /orthostatic vital signs thereafter   Hypokalemia -replete prn  CHF pef  -resume home regimen once patient symptoms have improved    DMII -noted to be hypoglycemic on admit -monitor on fs currently  -hold oral medications   CKDIIIb  -at baseline    DVT -continue Eliquis    HTN  -hold all ant-hypertension medications    Pafib -continue  on Eliquis,   Hx of +PPM,  h/o CVA x 3  -with residual Right Sided Deficits Left Visual Field Deficits  Hx of cholecystitis s/p biliary drain  -currently intact and working well  DVT prophylaxis: heparin Code Status: full Family Communication:  none at bedside Disposition Plan: patient  expected to be admitted less than 2 midnights Consults called: n/a Admission status: progressive    Clance Boll MD [facility]   If 7PM-7AM, please contact night-coverage [email or web address] Password  [facility]  01/11/2023, 11:51 PM

## 2023-01-11 NOTE — Clinical Note (Incomplete)
History and Physical    Tracey Bryant YIF:[id number] DOB: 13-Jan-1951 DOA: 01/11/2023  PCP: Coolidge Breeze, FNP  Patient coming from: rheumatology office   I have personally briefly reviewed patient's old medical records in [place]  Chief Complaint: recurrent syncope   HPI: Tracey Bryant is a 72 y.o. female with medical history significant of    ED Course: ***  Review of Systems: As per HPI otherwise 10 point review of systems negative.   Past Medical History:  Diagnosis Date  . Arthritis   . Chronic systolic (congestive) heart failure ([place])   . Diabetes mellitus without complication ([place])   . DVT (deep venous thrombosis) ([place]) 07/26/2021  . Hypertension   . Paroxysmal atrial fibrillation ([place]) 07/26/2021  . Stroke [facility])     Past Surgical History:  Procedure Laterality Date  . breast tumor    . HIP FRACTURE SURGERY Left   . IR CATHETER TUBE CHANGE  09/25/2021  . IR CHOLANGIOGRAM EXISTING TUBE  05/13/2021  . IR EXCHANGE BILIARY DRAIN  07/27/2021  . IR EXCHANGE BILIARY DRAIN  11/04/2021  . IR EXCHANGE BILIARY DRAIN  01/08/2022  . IR EXCHANGE BILIARY DRAIN  05/31/2022  . IR EXCHANGE BILIARY DRAIN  06/12/2022  . IR RADIOLOGIST EVAL & MGMT  09/04/2021  . IR REMOVAL OF CALCULI/DEBRIS BILIARY DUCT/GB  09/25/2021  . IR REMOVAL OF CALCULI/DEBRIS BILIARY DUCT/GB  11/04/2021  . IR REMOVAL OF CALCULI/DEBRIS BILIARY DUCT/GB  01/08/2022  . IR THORACENTESIS ASP PLEURAL SPACE W/IMG GUIDE  05/28/2022  . PACEMAKER IMPLANT    . TONSILLECTOMY       reports that she has never smoked. She has never used smokeless tobacco. She reports that she does not drink alcohol and does not use drugs.  No Known Allergies  Family History  Problem Relation Age of Onset  . Cancer Mother   . Diabetes Mother   . Heart disease Father    *** Prior to Admission medications   Medication Sig Start Date End Date Taking? Authorizing Provider  amLODipine (NORVASC) 10 MG tablet Take 10 mg  by mouth daily.   Yes [provider]  apixaban (ELIQUIS) 5 MG TABS tablet Take 1 tablet (5 mg total) by mouth 2 (two) times daily. 08/04/21  Yes Pattricia Boss, MD  carvedilol (COREG) 3.125 MG tablet Take 1 tablet (3.125 mg total) by mouth 2 (two) times daily with a meal. 06/09/22  Yes Samella Parr, NP  divalproex (DEPAKOTE) 500 MG DR tablet Take 500 mg by mouth 2 (two) times daily. 10/14/22  Yes [provider]  docusate sodium (COLACE) 100 MG capsule Take 100 mg by mouth daily as needed for mild constipation.   Yes [provider]  furosemide (LASIX) 20 MG tablet Take 5 mg by mouth daily.   Yes [provider]  glipiZIDE (GLUCOTROL XL) 5 MG 24 hr tablet Take 5 mg by mouth daily. 06/24/22  Yes [provider]  isosorbide mononitrate (IMDUR) 30 MG 24 hr tablet Take 15 mg by mouth daily.   Yes [provider]  QUEtiapine (SEROQUEL) 25 MG tablet Take 25 mg by mouth at bedtime. 10/25/22  Yes [provider]  hydroxychloroquine (PLAQUENIL) 200 MG tablet Take 200 mg by mouth daily. Patient not taking: Reported on 01/11/2023 01/11/23 04/11/23  [provider]  oxyCODONE (ROXICODONE) 5 MG immediate release tablet Take 1 tablet (5 mg total) by mouth every 6 (six) hours as needed for up to 15 doses for  severe pain. 11/03/22   Wyvonnia Dusky, MD  sulfaSALAzine (AZULFIDINE) 500 MG EC tablet Take 500 mg by mouth as directed. Take 500 mg daily for 14 days and then 500 mg bid Patient not taking: Reported on 01/11/2023 01/11/23 04/11/23  [provider]    Physical Exam: Vitals:   01/11/23 2045 01/11/23 2130 01/11/23 2206 01/11/23 2330  BP: (!) 184/91 (!) 183/93 (!) 183/89 (!) 175/92  Pulse: 76 73 84 77  Resp: '16 15 13 17  '$ Temp:   98.5 F (36.9 C)   TempSrc:   Oral   SpO2: 99% 97% 100% 95%  Weight:        Constitutional: NAD, calm, comfortable Vitals:   01/11/23 2045 01/11/23 2130 01/11/23 2206 01/11/23 2330  BP: (!) 184/91  (!) 183/93 (!) 183/89 (!) 175/92  Pulse: 76 73 84 77  Resp: '16 15 13 17  '$ Temp:   98.5 F (36.9 C)   TempSrc:   Oral   SpO2: 99% 97% 100% 95%  Weight:       Eyes: PERRL, lids and conjunctivae normal ENMT: Mucous membranes are moist. Posterior pharynx clear of any exudate or lesions.Normal dentition.  Neck: normal, supple, no masses, no thyromegaly Respiratory: clear to auscultation bilaterally, no wheezing, no crackles. Normal respiratory effort. No accessory muscle use.  Cardiovascular: Regular rate and rhythm, no murmurs / rubs / gallops. No extremity edema. 2+ pedal pulses. No carotid bruits.  Abdomen: no tenderness, no masses palpated. No hepatosplenomegaly. Bowel sounds positive.  Musculoskeletal: no clubbing / cyanosis. No joint deformity upper and lower extremities. Good ROM, no contractures. Normal muscle tone.  Skin: no rashes, lesions, ulcers. No induration Neurologic: CN 2-12 grossly intact. Sensation intact, DTR normal. Strength 5/5 in all 4.  Psychiatric: Normal judgment and insight. Alert and oriented x 3. Normal mood.    Labs on Admission: I have personally reviewed following labs and imaging studies  CBC: Recent Labs  Lab 01/11/23 2005  WBC 5.4  NEUTROABS 3.2  HGB 9.3*  HCT 29.7*  MCV 96.4  PLT 160   Basic Metabolic Panel: Recent Labs  Lab 01/11/23 2005  NA 137  K 3.3*  CL 109  CO2 21*  GLUCOSE 60*  BUN 49*  CREATININE 2.23*  CALCIUM 7.9*   GFR: Estimated Creatinine Clearance: 25.4 mL/min (A) (by C-G formula based on SCr of 2.23 mg/dL (H)). Liver Function Tests: No results for input(s): "AST", "ALT", "ALKPHOS", "BILITOT", "PROT", "ALBUMIN" in the last 168 hours. No results for input(s): "LIPASE", "AMYLASE" in the last 168 hours. No results for input(s): "AMMONIA" in the last 168 hours. Coagulation Profile: No results for input(s): "INR", "PROTIME" in the last 168 hours. Cardiac Enzymes: No results for input(s): "CKTOTAL", "CKMB", "CKMBINDEX",  "TROPONINI" in the last 168 hours. BNP (last 3 results) Recent Labs    05/04/22 1625  PROBNP 15,044*   HbA1C: No results for input(s): "HGBA1C" in the last 72 hours. CBG: Recent Labs  Lab 01/11/23 2005  GLUCAP 63*   Lipid Profile: No results for input(s): "CHOL", "HDL", "LDLCALC", "TRIG", "CHOLHDL", "LDLDIRECT" in the last 72 hours. Thyroid Function Tests: No results for input(s): "TSH", "T4TOTAL", "FREET4", "T3FREE", "THYROIDAB" in the last 72 hours. Anemia Panel: No results for input(s): "VITAMINB12", "FOLATE", "FERRITIN", "TIBC", "IRON", "RETICCTPCT" in the last 72 hours. Urine analysis:    Component Value Date/Time   COLORURINE YELLOW (A) 01/11/2023 2210   APPEARANCEUR HAZY (A) 01/11/2023 2210   LABSPEC 1.012 01/11/2023 2210   PHURINE 6.0 01/11/2023  [place] 01/11/2023 2210   HGBUR SMALL (A) 01/11/2023 2210   BILIRUBINUR NEGATIVE 01/11/2023 2210   KETONESUR NEGATIVE 01/11/2023 2210   PROTEINUR >=300 (A) 01/11/2023 2210   UROBILINOGEN 0.2 01/17/2013 1910   NITRITE POSITIVE (A) 01/11/2023 2210   LEUKOCYTESUR SMALL (A) 01/11/2023 2210    Radiological Exams on Admission: CT ABDOMEN PELVIS WO CONTRAST  Result Date: 01/11/2023 CLINICAL DATA:  Abdominal pain, acute, nonlocalized EXAM: CT ABDOMEN AND PELVIS WITHOUT CONTRAST TECHNIQUE: Multidetector CT imaging of the abdomen and pelvis was performed following the standard protocol without IV contrast. RADIATION DOSE REDUCTION: This exam was performed according to the departmental dose-optimization program which includes automated exposure control, adjustment of the mA and/or kV according to patient size and/or use of iterative reconstruction technique. COMPARISON:  CT abdomen pelvis 12/14/2022, temp pelvis 01/17/2013 FINDINGS: Lower chest: Bilateral trace pleural effusions with pleural thickening on the right. Partially visualize cardiac leads. Hepatobiliary: Mild hepatomegaly measuring up to 19 cm. No focal liver  abnormality. Contracted gallbladder with percutaneous cholecystostomy pigtail terminating likely within the gallbladder lumen. No biliary dilatation. Pancreas: No focal lesion. Normal pancreatic contour. No surrounding inflammatory changes. No main pancreatic ductal dilatation. Spleen: Normal in size without focal abnormality. Adrenals/Urinary Tract: No adrenal nodule bilaterally. No nephrolithiasis and no hydronephrosis. 7 cm fluid density lesion within the right kidney with thin peripheral calcifications likely representing a minimally complex cyst-no further follow-up indicated. No ureterolithiasis or hydroureter. The urinary bladder is unremarkable. Stomach/Bowel: Stomach is within normal limits. No evidence of bowel wall thickening or dilatation. Colonic diverticulosis. Appendix appears normal. Vascular/Lymphatic: No abdominal aorta or iliac aneurysm. Moderate atherosclerotic plaque of the aorta and its branches. No abdominal, pelvic, or inguinal lymphadenopathy. Reproductive: Uterus and bilateral adnexa are unremarkable. Other: Trace volume simple free fluid. No intraperitoneal free gas. No organized fluid collection. Musculoskeletal: Diffuse subcutaneus soft tissue edema. Persistent well-defined 2 cm left hip subcutaneus soft tissue density. Tiny fat containing umbilical hernia. No suspicious lytic or blastic osseous lesions. No acute displaced fracture. Total left hip arthroplasty partially visualized. IMPRESSION: 1. Bilateral trace pleural effusions with pleural thickening on the right. Right empyema not excluded. 2. Contracted gallbladder with percutaneous cholecystostomy pigtail terminating likely within the gallbladder lumen. 3. Trace simple free fluid ascites. 4. Colonic diverticulosis with no acute diverticulitis. 5. Mild hepatomegaly. 6. Persistent well-defined 2 cm left hip subcutaneus soft tissue density. Query seroma. 7.  Aortic Atherosclerosis (ICD10-I70.0). Electronically Signed   By: Iven Finn M.D.   On: 01/11/2023 22:22   CT HEAD WO CONTRAST  Result Date: 01/11/2023 CLINICAL DATA:  Syncope/presyncope, cerebrovascular cause suspected syncope EXAM: CT HEAD WITHOUT CONTRAST TECHNIQUE: Contiguous axial images were obtained from the base of the skull through the vertex without intravenous contrast. RADIATION DOSE REDUCTION: This exam was performed according to the departmental dose-optimization program which includes automated exposure control, adjustment of the mA and/or kV according to patient size and/or use of iterative reconstruction technique. COMPARISON:  CT head 07/13/2022. FINDINGS: Brain: No evidence of acute infarction, hemorrhage, hydrocephalus, extra-axial collection or mass lesion/mass effect. Similar chronic infarcts in the right frontal, parietal, posterior temporal and occipital lobes. Small remote left cerebellar infarct. Vascular: No hyperdense vessel. Skull: No acute fracture. Sinuses/Orbits: Largely clear sinuses.  No acute orbital findings. Other: No mastoid effusions. IMPRESSION: 1. No evidence of acute intracranial abnormality. 2. Chronic infarcts. Electronically Signed   By: Margaretha Sheffield M.D.   On: 01/11/2023 16:41    EKG: Independently reviewed. ***  Assessment/Plan  Principal Problem:   Syncope   ***  DVT prophylaxis: *** (Lovenox/Heparin/SCD's/anticoagulated/None (if comfort care) Code Status: *** (Full/Partial (specify details) Family Communication: *** (Specify name, relationship. Do not write "discussed with patient". Specify tel # if discussed over the phone) Disposition Plan: *** (specify when and where you expect patient to be discharged) Consults called: *** (with names) Admission status: *** (inpatient / obs / tele / medical floor / SDU)   Clance Boll MD [facility] Pager [phone]  If 7PM-7AM, please contact night-coverage [email or web address] Password [facility]  01/11/2023, 11:51 PM

## 2023-01-11 NOTE — ED Triage Notes (Signed)
Read First nurse note on 01/11/2023 at 1608.

## 2023-01-11 NOTE — ED Provider Notes (Signed)
[facility] Provider Note    Event Date/Time   First MD Initiated Contact with Patient 01/11/23 1742     (approximate)   History   Loss of Consciousness   HPI  Tracey Bryant is a 72 y.o. female with PMHx vasovagal syncope here with weakness. Pt has had recurrent episodes of syncope today. She has a h/o same but sx worse today. Pt was at her rheumatologist appointment today getting labs drawn. They had a hard time getting blood. They were done attempting and sat pt up and she acutely lost consciousness. She has had recurrent episodes of feeling like she is going to pass out with position changes since then. This occurs in the setting of recently having issues with the drainage from her GB drain. It has been draining normally over past 2 weeks though. She has also had some urinary frequency and generalized weakness.      Physical Exam   Triage Vital Signs: ED Triage Vitals  Enc Vitals Group     BP 01/11/23 1618 (!) 176/95     Pulse Rate 01/11/23 1618 78     Resp 01/11/23 1618 18     Temp 01/11/23 1618 98.4 F (36.9 C)     Temp Source 01/11/23 1618 Oral     SpO2 01/11/23 1618 97 %     Weight 01/11/23 1618 187 lb (84.8 kg)     Height --      Head Circumference --      Peak Flow --      Pain Score 01/11/23 1617 8     Pain Loc --      Pain Edu? --      Excl. in [place]? --     Most recent vital signs: Vitals:   01/11/23 2330 01/12/23 0130  BP: (!) 175/92 (!) 172/87  Pulse: 77 75  Resp: 17 18  Temp:    SpO2: 95% 95%     General: Awake, no distress.  CV:  Good peripheral perfusion. RRR. No murmurs, rubs, or gallops. Resp:  Normal effort. Lungs clear. Abd:  No distention. Mild TTP in epigastric and RUQ. RUQ drain with biliary drainage. Other:  Mildly dry MM. No focal neurological deficits.   ED Results / Procedures / Treatments   Labs (all labs ordered are listed, but only abnormal results are displayed) Labs Reviewed  URINALYSIS, ROUTINE  W REFLEX MICROSCOPIC - Abnormal; Notable for the following components:      Result Value   Color, Urine YELLOW (*)    APPearance HAZY (*)    Hgb urine dipstick SMALL (*)    Protein, ur >=300 (*)    Nitrite POSITIVE (*)    Leukocytes,Ua SMALL (*)    Bacteria, UA RARE (*)    All other components within normal limits  CBC WITH DIFFERENTIAL/PLATELET - Abnormal; Notable for the following components:   RBC 3.08 (*)    Hemoglobin 9.3 (*)    HCT 29.7 (*)    RDW 15.8 (*)    All other components within normal limits  BASIC METABOLIC PANEL - Abnormal; Notable for the following components:   Potassium 3.3 (*)    CO2 21 (*)    Glucose, Bld 60 (*)    BUN 49 (*)    Creatinine, Ser 2.23 (*)    Calcium 7.9 (*)    GFR, Estimated 23 (*)    All other components within normal limits  CBG MONITORING, ED - Abnormal; Notable for the  following components:   Glucose-Capillary 63 (*)    All other components within normal limits  CBG MONITORING, ED - Abnormal; Notable for the following components:   Glucose-Capillary 104 (*)    All other components within normal limits  URINE CULTURE  URINE CULTURE  VALPROIC ACID LEVEL  D-DIMER, QUANTITATIVE  TSH  COMPREHENSIVE METABOLIC PANEL  CBC  BRAIN NATRIURETIC PEPTIDE  TROPONIN I (HIGH SENSITIVITY)  TROPONIN I (HIGH SENSITIVITY)     EKG Atrial paced rhythm, VR 77. PR 260, QRS94, QTc 482. No acute ST elevation or depression.   RADIOLOGY CT AP: Bilateral trace effusions, contracted GB, trace ascites CT Head: NAICA    I also independently reviewed and agree with radiologist interpretations.   PROCEDURES:  Critical Care performed: No    MEDICATIONS ORDERED IN ED: Medications  cefTRIAXone (ROCEPHIN) 1 g in sodium chloride 0.9 % 100 mL IVPB (has no administration in time range)  oxyCODONE (Oxy IR/ROXICODONE) immediate release tablet 5 mg (has no administration in time range)  apixaban (ELIQUIS) tablet 5 mg (5 mg Oral Given 01/12/23 0209)   divalproex (DEPAKOTE) DR tablet 500 mg (500 mg Oral Given 01/12/23 0209)  sodium chloride flush (NS) 0.9 % injection 3 mL (3 mLs Intravenous Given 01/12/23 0209)  0.9 %  sodium chloride infusion (has no administration in time range)  sodium chloride 0.9 % bolus 500 mL (0 mLs Intravenous Stopped 01/11/23 2058)  potassium chloride SA (KLOR-CON M) CR tablet 40 mEq (40 mEq Oral Given 01/11/23 2204)  cefTRIAXone (ROCEPHIN) 1 g in sodium chloride 0.9 % 100 mL IVPB (0 g Intravenous Stopped 01/12/23 0031)     IMPRESSION / MDM / ASSESSMENT AND PLAN / ED COURSE  I reviewed the triage vital signs and the nursing notes.                              Differential diagnosis includes, but is not limited to, orthostatic syncope, vasovagal syncope, occult infection, arrhythmia, ACS, electrolyte abnormality  Patient's presentation is most consistent with acute presentation with potential threat to life or bodily function.  The patient is on the cardiac monitor to evaluate for evidence of arrhythmia and/or significant heart rate changes.  72 yo F with PMHx as above here with syncopal episodes, generalized weakness. EKG nonischemic.BMP with mild hypokalemia and dehydration, CKD. Hgb 9.3. No focal neuro deficits. EKG nonischemic. UA does show +UTI. Unclear etiology for her episodes - she has components of orthostasis but also vagal type reaction, and of note this seemed to have gotten worse with perc bili drain. Drain is in appropriate position and CT scan shows no complications. Will admit for obs as pt continues to be near syncopal with movement, weak, with +UTI.     FINAL CLINICAL IMPRESSION(S) / ED DIAGNOSES   Final diagnoses:  Syncope and collapse  Acute cystitis without hematuria     Rx / DC Orders   ED Discharge Orders     None        Note:  This document was prepared using Dragon voice recognition software and may include unintentional dictation errors.   Duffy Bruce, MD 01/12/23  [phone]

## 2023-01-11 NOTE — ED Triage Notes (Signed)
First nurse note: Pt to ED from lab. Pt was seen at rheumatologist and was sent to have blood drawn. Pt had a syncopal episode while getting blood drawn and has hx of vageling down. Lab staff states pt appeared to have a seizure lasting approximately 4mn. Pt brought over on stretcher. Staff stating every time they tried to sit pt up she would have near-syncopal episode.

## 2023-01-11 NOTE — ED Notes (Signed)
Patient incontinent of large amount of urine in brief. Pericare performed, linens changed and dry chux and brief applied. External female catheter applied. While rolling patient to left side, patient reported she felt dizzy. Patient placed on back and appeared to have sudden onset of decreased level of consciousness but with continued regular unlabored respirations. Patient began talking again shortly after episode. Vital signs obtained with BP of 184/91. No acute distress noted. 425 ml bile drained from bili drain. Sister at bedside.

## 2023-01-12 ENCOUNTER — Inpatient Hospital Stay: Payer: Medicare HMO

## 2023-01-12 ENCOUNTER — Encounter: Payer: Self-pay | Admitting: Internal Medicine

## 2023-01-12 ENCOUNTER — Inpatient Hospital Stay
Admit: 2023-01-12 | Discharge: 2023-01-12 | Disposition: A | Discharge status: Home / Self Care | Payer: Medicare HMO | Attending: Internal Medicine | Admitting: Internal Medicine

## 2023-01-12 ENCOUNTER — Other Ambulatory Visit: Payer: Self-pay

## 2023-01-12 DIAGNOSIS — E876 Hypokalemia: Secondary | ICD-10-CM | POA: Diagnosis not present

## 2023-01-12 DIAGNOSIS — E039 Hypothyroidism, unspecified: Secondary | ICD-10-CM | POA: Diagnosis present

## 2023-01-12 DIAGNOSIS — Z8673 Personal history of transient ischemic attack (TIA), and cerebral infarction without residual deficits: Secondary | ICD-10-CM

## 2023-01-12 DIAGNOSIS — E11649 Type 2 diabetes mellitus with hypoglycemia without coma: Secondary | ICD-10-CM

## 2023-01-12 DIAGNOSIS — M069 Rheumatoid arthritis, unspecified: Secondary | ICD-10-CM | POA: Diagnosis present

## 2023-01-12 DIAGNOSIS — I10 Essential (primary) hypertension: Secondary | ICD-10-CM

## 2023-01-12 DIAGNOSIS — Z86718 Personal history of other venous thrombosis and embolism: Secondary | ICD-10-CM

## 2023-01-12 DIAGNOSIS — I5022 Chronic systolic (congestive) heart failure: Secondary | ICD-10-CM

## 2023-01-12 DIAGNOSIS — D509 Iron deficiency anemia, unspecified: Secondary | ICD-10-CM | POA: Diagnosis present

## 2023-01-12 DIAGNOSIS — K811 Chronic cholecystitis: Secondary | ICD-10-CM | POA: Diagnosis not present

## 2023-01-12 DIAGNOSIS — N3001 Acute cystitis with hematuria: Secondary | ICD-10-CM | POA: Diagnosis not present

## 2023-01-12 DIAGNOSIS — N184 Chronic kidney disease, stage 4 (severe): Secondary | ICD-10-CM

## 2023-01-12 DIAGNOSIS — H8112 Benign paroxysmal vertigo, left ear: Secondary | ICD-10-CM

## 2023-01-12 LAB — CBC
HCT: 30.8 % — ABNORMAL LOW (ref 36.0–46.0)
Hemoglobin: 9.7 g/dL — ABNORMAL LOW (ref 12.0–15.0)
MCH: 30.3 pg (ref 26.0–34.0)
MCHC: 31.5 g/dL (ref 30.0–36.0)
MCV: 96.3 fL (ref 80.0–100.0)
Platelets: 326 10*3/uL (ref 150–400)
RBC: 3.2 MIL/uL — ABNORMAL LOW (ref 3.87–5.11)
RDW: 15.9 % — ABNORMAL HIGH (ref 11.5–15.5)
WBC: 5.8 10*3/uL (ref 4.0–10.5)
nRBC: 0 % (ref 0.0–0.2)

## 2023-01-12 LAB — BRAIN NATRIURETIC PEPTIDE: B Natriuretic Peptide: 2084.6 pg/mL — ABNORMAL HIGH (ref 0.0–100.0)

## 2023-01-12 LAB — COMPREHENSIVE METABOLIC PANEL
ALT: 7 U/L (ref 0–44)
AST: 12 U/L — ABNORMAL LOW (ref 15–41)
Albumin: 2.6 g/dL — ABNORMAL LOW (ref 3.5–5.0)
Alkaline Phosphatase: 33 U/L — ABNORMAL LOW (ref 38–126)
Anion gap: 9 (ref 5–15)
BUN: 48 mg/dL — ABNORMAL HIGH (ref 8–23)
CO2: 20 mmol/L — ABNORMAL LOW (ref 22–32)
Calcium: 8 mg/dL — ABNORMAL LOW (ref 8.9–10.3)
Chloride: 108 mmol/L (ref 98–111)
Creatinine, Ser: 2.27 mg/dL — ABNORMAL HIGH (ref 0.44–1.00)
GFR, Estimated: 23 mL/min — ABNORMAL LOW (ref >60)
Glucose, Bld: 117 mg/dL — ABNORMAL HIGH (ref 70–99)
Potassium: 3.8 mmol/L (ref 3.5–5.1)
Sodium: 137 mmol/L (ref 135–145)
Total Bilirubin: 0.4 mg/dL (ref 0.3–1.2)
Total Protein: 7.5 g/dL (ref 6.5–8.1)

## 2023-01-12 LAB — CBG MONITORING, ED
Glucose-Capillary: 104 mg/dL — ABNORMAL HIGH (ref 70–99)
Glucose-Capillary: 167 mg/dL — ABNORMAL HIGH (ref 70–99)

## 2023-01-12 LAB — VALPROIC ACID LEVEL: Valproic Acid Lvl: 43 ug/mL — ABNORMAL LOW (ref 50.0–100.0)

## 2023-01-12 LAB — TSH: TSH: 7.464 u[IU]/mL — ABNORMAL HIGH (ref 0.350–4.500)

## 2023-01-12 LAB — D-DIMER, QUANTITATIVE: D-Dimer, Quant: 3.74 ug/mL-FEU — ABNORMAL HIGH (ref 0.00–0.50)

## 2023-01-12 MED ORDER — LEVOTHYROXINE SODIUM 50 MCG PO TABS
25.0000 ug | ORAL_TABLET | Freq: Every day | ORAL | Status: DC
  Administered 2023-01-12 – 2023-01-23 (×12): 25 ug via ORAL
  Filled 2023-01-12 (×12): qty 1

## 2023-01-12 MED ORDER — OXYCODONE HCL 5 MG PO TABS
5.0000 mg | ORAL_TABLET | Freq: Four times a day (QID) | ORAL | Status: DC | PRN
  Administered 2023-01-13 – 2023-01-21 (×5): 5 mg via ORAL
  Filled 2023-01-12 (×6): qty 1

## 2023-01-12 MED ORDER — LOSARTAN POTASSIUM 50 MG PO TABS: 25.0000 mg | ORAL_TABLET | Freq: Every day | ORAL | Status: DC

## 2023-01-12 MED ORDER — FUROSEMIDE 20 MG PO TABS
20.0000 mg | ORAL_TABLET | Freq: Every day | ORAL | Status: DC
  Administered 2023-01-12 – 2023-01-16 (×5): 20 mg via ORAL
  Filled 2023-01-12 (×5): qty 1

## 2023-01-12 MED ORDER — SODIUM CHLORIDE 0.9 % IV SOLN: INTRAVENOUS | Status: DC

## 2023-01-12 MED ORDER — DIVALPROEX SODIUM 500 MG PO DR TAB
500.0000 mg | DELAYED_RELEASE_TABLET | Freq: Two times a day (BID) | ORAL | Status: DC
  Administered 2023-01-12 – 2023-01-23 (×24): 500 mg via ORAL
  Filled 2023-01-12 (×24): qty 1

## 2023-01-12 MED ORDER — DOCUSATE SODIUM 100 MG PO CAPS: 100.0000 mg | ORAL_CAPSULE | Freq: Every day | ORAL | Status: DC | PRN

## 2023-01-12 MED ORDER — MECLIZINE HCL 25 MG PO TABS
12.5000 mg | ORAL_TABLET | Freq: Three times a day (TID) | ORAL | Status: DC
  Administered 2023-01-12 – 2023-01-13 (×4): 12.5 mg via ORAL
  Filled 2023-01-12: qty 0.5
  Filled 2023-01-12: qty 1
  Filled 2023-01-12 (×2): qty 0.5
  Filled 2023-01-12: qty 1

## 2023-01-12 MED ORDER — LOSARTAN POTASSIUM 25 MG PO TABS
25.0000 mg | ORAL_TABLET | Freq: Every day | ORAL | Status: DC
  Administered 2023-01-12 – 2023-01-15 (×4): 25 mg via ORAL
  Filled 2023-01-12 (×4): qty 1

## 2023-01-12 MED ORDER — APIXABAN 5 MG PO TABS
5.0000 mg | ORAL_TABLET | Freq: Two times a day (BID) | ORAL | Status: DC
  Administered 2023-01-12 – 2023-01-17 (×12): 5 mg via ORAL
  Filled 2023-01-12 (×12): qty 1

## 2023-01-12 MED ORDER — HYDROXYCHLOROQUINE SULFATE 200 MG PO TABS
200.0000 mg | ORAL_TABLET | Freq: Every day | ORAL | Status: DC
  Administered 2023-01-12 – 2023-01-23 (×12): 200 mg via ORAL
  Filled 2023-01-12 (×12): qty 1

## 2023-01-12 MED ORDER — PERFLUTREN LIPID MICROSPHERE
1.0000 mL | INTRAVENOUS | Status: AC | PRN
  Administered 2023-01-12: 5 mL via INTRAVENOUS

## 2023-01-12 MED ORDER — AMLODIPINE BESYLATE 5 MG PO TABS
5.0000 mg | ORAL_TABLET | Freq: Every day | ORAL | Status: DC
  Administered 2023-01-12 – 2023-01-13 (×2): 5 mg via ORAL
  Filled 2023-01-12 (×2): qty 1

## 2023-01-12 MED ORDER — QUETIAPINE FUMARATE 25 MG PO TABS
25.0000 mg | ORAL_TABLET | Freq: Every day | ORAL | Status: DC
  Administered 2023-01-12 – 2023-01-22 (×11): 25 mg via ORAL
  Filled 2023-01-12 (×12): qty 1

## 2023-01-12 MED ORDER — CARVEDILOL 6.25 MG PO TABS
3.1250 mg | ORAL_TABLET | Freq: Two times a day (BID) | ORAL | Status: DC
  Administered 2023-01-12 – 2023-01-23 (×22): 3.125 mg via ORAL
  Filled 2023-01-12 (×22): qty 1

## 2023-01-12 MED ORDER — SODIUM CHLORIDE 0.9% FLUSH
3.0000 mL | Freq: Two times a day (BID) | INTRAVENOUS | Status: DC
  Administered 2023-01-12 – 2023-01-23 (×20): 3 mL via INTRAVENOUS

## 2023-01-12 MED ORDER — DIAZEPAM 2 MG PO TABS: 2.0000 mg | ORAL_TABLET | Freq: Two times a day (BID) | ORAL | Status: DC | PRN

## 2023-01-12 MED ORDER — ISOSORBIDE MONONITRATE ER 30 MG PO TB24
15.0000 mg | ORAL_TABLET | Freq: Every day | ORAL | Status: DC
  Administered 2023-01-12 – 2023-01-22 (×11): 15 mg via ORAL
  Filled 2023-01-12 (×12): qty 1

## 2023-01-12 NOTE — ED Notes (Signed)
Phlebotomy contacted to collect outstanding lab orders.

## 2023-01-12 NOTE — ED Notes (Signed)
Friend at bedside with pt.

## 2023-01-12 NOTE — ED Notes (Signed)
Provider [place] in ER reviewing pt's labs/chart now.

## 2023-01-12 NOTE — Hospital Course (Addendum)
72 y.o. female with medical history significant of  CHF pef , DMII, DVT, HTN , Pafib on Eliquis, CKDIIIb +PPM,h/o CVA x 3, hx of cholecystitis now with chronic biliary drain,history of recurrent syncope followed by neuro who presents to ED in referral from rheumatology clinic s/p recurrent episode of syncope. Patient states she had these episode for over a year and noted that these episodes are not frequent and occur every 4-6 months. She state she does get an aura prior to even and that during even she states she is partially aware but too weak to speak. She states that after even she feels severely fatigued for 5-10 mins and thereafter back to her baseline.  With episodes no she noted on chest pain , n/v/d/ dysuria fever/chills. Or shortness of breath. Of note patient has similar admission 7/18 and at that time evaluation was unrevealing and it was thought that these episodes were stress induced.   1/17.  When turning head to the left patient developed room spinning and dizziness.  Added standing dose meclizine and as needed Valium for vertigo.  When sitting up with physical therapy today, she developed dizziness decreased level of alertness and slurred speech.  Symptoms resolved after few minutes. 1/18.  When working with physical therapy and they sat her up she had shaking episode and became less responsive and some slurred speech.  We checked her blood pressure when this happened and it did not drop.  Patient had similar episode when I went back into the room to see her.  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 1/19.  A.m. cortisol very low at 1.7.  No recent fills of steroids.  Will start Cortef for adrenal insufficiency.  Add on ACTH. 1/20.  Patient walked 4 feet with physical therapy today. 1/21.  Creatinine continues to creep up to 2.64.  D/C losartan.  Fluid bolus given. 1/22.  Eliquis held after morning dose secondary  to being told that she has been having nosebleeds.  Hemoglobin drifted down to 8.2.  Creatinine 2.68.  IV iron given.  Walked 20 feet with physical therapy. 1/23.  Had another nosebleed overnight.  Eliquis still on hold.  This morning's hemoglobin 9.3 and creatinine 2.59.  Patient orthostatic with physical therapy today. 1/24 - renal function at plateau, remain on gentle fluids 1/25 - trial resume low dose Eliquis, renal function plateau'd 1/26 - had recurrent epistaxis, stop Eliquis. Hbg 7.0 >> 6.9, declines blood transfusion, gave IV iron 1/27>>28 - Hbg stable and improved since off Eliquis.  Renal function further improved since off fluids, with Lasix held.  Medically stable for discharge home with home health and family / caregivers support.

## 2023-01-12 NOTE — Evaluation (Signed)
Occupational Therapy Evaluation Patient Details Name: Tracey Bryant MRN: [id number] DOB: 1951/05/19 Today's Date: 01/12/2023   History of Present Illness 72 y/o female presented to ED on 01/11/23 for recurrent syncope. Admitted for possible UTI. PMH: arthritis, CHF, DM II, DVT, HTN, afib, and stroke. hx of cholecystitis now with chronic biliary drain   Clinical Impression   Pt agreeable to OT/PT co-treatment to maximize safety and participation. PTA pt was Mod I for ADLs, received assistance from sister for IADLs, and Mod I for functional mobility using RW. Pt received supine in bed this date. She was able to provide PLOF, answer orientation questions, and follow commands appropriately at beginning of session. RN present to give medications and asked that pt be scooted up towards [facility] in order to safely take PO meds. PT/OT flattening stretcher and scooting pt toward HOB with Max A +2. Therapists then raising [place] back up into upright sitting position. Pt with sudden dizziness and change in alertness/arousal. Pt experiencing lethargy, slow/incomprehensible speech, and delayed processing. Able to state full name and DOB with increased time and Max VC. No nystagmus noted. Checked BP and negative for orthostatics. Pt will benefit from acute OT to increase overall independence in the areas of ADLs and functional mobility in order to safely discharge to next venue of care. Upon hospital discharge, recommend STR to maximize pt safety and return to PLOF.    Recommendations for follow up therapy are one component of a multi-disciplinary discharge planning process, led by the attending physician.  Recommendations may be updated based on patient status, additional functional criteria and insurance authorization.   Follow Up Recommendations  Skilled nursing-short term rehab (<3 hours/day)     Assistance Recommended at Discharge Frequent or constant Supervision/Assistance  Patient can return home with the  following A lot of help with bathing/dressing/bathroom;A lot of help with walking and/or transfers;Assistance with cooking/housework;Assist for transportation;Help with stairs or ramp for entrance    Functional Status Assessment  Patient has had a recent decline in their functional status and demonstrates the ability to make significant improvements in function in a reasonable and predictable amount of time.  Equipment Recommendations  Other (comment) (defer to next venue of care)    Recommendations for Other Services       Precautions / Restrictions Precautions Precautions: Fall Restrictions Weight Bearing Restrictions: No      Mobility Bed Mobility Overal bed mobility: Needs Assistance             General bed mobility comments: Max A +2 to scoot pt up toward [facility]    Transfers                   General transfer comment: deferred 2/2 alertness/arousal      Balance Overall balance assessment: Needs assistance     Sitting balance - Comments: deferred 2/2 alertness/arousal                                   ADL either performed or assessed with clinical judgement   ADL Overall ADL's : Needs assistance/impaired                     Lower Body Dressing: Maximal assistance;Bed level Lower Body Dressing Details (indicate cue type and reason): socks               General ADL Comments: ADL assessment limited 2/2 near syncopal episode  Vision Baseline Vision/History: 1 Wears glasses Patient Visual Report: No change from baseline       Perception     Praxis      Pertinent Vitals/Pain Pain Assessment Pain Assessment: Faces Faces Pain Scale: Hurts a little bit Pain Location: BUEs with movement Pain Descriptors / Indicators: Grimacing, Guarding, Sore Pain Intervention(s): Limited activity within patient's tolerance, Monitored during session, Repositioned     Hand Dominance Right   Extremity/Trunk Assessment Upper  Extremity Assessment Upper Extremity Assessment: Generalized weakness (pain with movement, pt endorsing arthritis, "shakiness" when attempting functional task that pt reports is from arthritis/pain (likely due to fatigue))   Lower Extremity Assessment Lower Extremity Assessment: Generalized weakness       Communication Communication Communication: No difficulties   Cognition Arousal/Alertness: Awake/alert, Lethargic Behavior During Therapy: WFL for tasks assessed/performed, Flat affect Overall Cognitive Status: Difficult to assess                                 General Comments: Alert and oriented at beginning of evaluation, answered questions/followed commands appropriately. Arousal/alertness changing during near syncopal episode, able to state full name and DOB with increased time and Max VC.     General Comments  BP reading taken t/o session all in supine. After onset of symptoms, BP: 177/101 (MAP 121). ~5-10 min later when symptoms appeared to be resolving: BP 187/103 (MAP 128). BP cuff switched to LUE to rule out incorrect reading. BP 178/104 (MAP 125) at end of session. RN present.    Exercises Other Exercises Other Exercises: OT provided education re: role of OT, OT POC, post acute recs, sitting up for all meals, EOB/OOB mobility with assistance, home/fall safety.     Shoulder Instructions      Home Living Family/patient expects to be discharged to:: Private residence Living Arrangements: Other relatives (has been living with sister since recent D/C from rehab) Available Help at Discharge: Family;Available 24 hours/day Type of Home: House Home Access: Stairs to enter [employer] of Steps: small threshold   Home Layout: One level     Bathroom Shower/Tub: Walk-in shower;Door   [employer] Toilet: Standard     Home Equipment: Rollator (4 wheels);Shower seat;BSC/3in1;Grab bars - Statistician (2 wheels);Cane - single point    Additional Comments: Above home set up is for pt's sisters house where she has been staying temporarily since D/C from rehab.      Prior Functioning/Environment Prior Level of Function : Needs assist             Mobility Comments: Pt reports receiving assistance for bed mobility and STS recently since D/C from rehab. ADLs Comments: Mod I ADLs, assist for IADLs from sister PRN, family provides transportation        OT Problem List: Decreased strength;Decreased range of motion;Decreased activity tolerance;Impaired balance (sitting and/or standing)      OT Treatment/Interventions: Self-care/ADL training;Therapeutic exercise;Therapeutic activities;Energy conservation;DME and/or AE instruction;Patient/family education;Balance training    OT Goals(Current goals can be found in the care plan section) Acute Rehab OT Goals Patient Stated Goal: figure out what's going on OT Goal Formulation: With patient Time For Goal Achievement: 01/26/23 Potential to Achieve Goals: Fair   OT Frequency: Min 2X/week    Co-evaluation PT/OT/SLP Co-Evaluation/Treatment: Yes Reason for Co-Treatment: For patient/therapist safety;To address functional/ADL transfers PT goals addressed during session: Mobility/safety with mobility OT goals addressed during session: ADL's and self-care      AM-PAC OT "  6 Clicks" Daily Activity     Outcome Measure Help from another person eating meals?: A Little Help from another person taking care of personal grooming?: A Little Help from another person toileting, which includes using toliet, bedpan, or urinal?: A Lot Help from another person bathing (including washing, rinsing, drying)?: A Lot Help from another person to put on and taking off regular upper body clothing?: A Little Help from another person to put on and taking off regular lower body clothing?: A Lot 6 Click Score: 15   End of Session Nurse Communication: Mobility status;Other (comment) (BP  (hypertensive))  Activity Tolerance: Treatment limited secondary to medical complications (Comment) Patient left: in bed;with call bell/phone within reach;with bed alarm set  OT Visit Diagnosis: Dizziness and giddiness (R42);Other abnormalities of gait and mobility (R26.89);Muscle weakness (generalized) (M62.81)                Time: 2505-3976 OT Time Calculation (min): 25 min Charges:  OT General Charges $OT Visit: 1 Visit OT Evaluation $OT Eval Moderate Complexity: 1 Mod  [facility] MS, OTR/L ascom [phone]  01/12/23, 2:06 PM

## 2023-01-12 NOTE — Assessment & Plan Note (Addendum)
Held Eliquis due to epistaxis and anemia. Did a trial on low dose Eliquis but she had recurrent epistaxis. Discontinue Eliquis. Repeat U/S of the lower extremity does not show any DVT.

## 2023-01-12 NOTE — ED Notes (Signed)
Echo still at bedside.

## 2023-01-12 NOTE — Assessment & Plan Note (Signed)
CT scan does not show any acute infarcts.

## 2023-01-12 NOTE — Progress Notes (Signed)
Progress Note   Patient: Tracey Bryant XIP:[id number] DOB: 1951-04-01 DOA: 01/11/2023     1 DOS: the patient was seen and examined on 01/12/2023   Brief hospital course: 72 y.o. female with medical history significant of  CHF pef , DMII, DVT, HTN , Pafib on Eliquis, CKDIIIb +PPM,h/o CVA x 3, hx of cholecystitis now with chronic biliary drain,history of recurrent syncope followed by neuro who presents to ED in referral from rheumatology clinic s/p recurrent episode of syncope. Patient states she had these episode for over a year and noted that these episodes are not frequent and occur every 4-6 months. She state she does get an aura prior to even and that during even she states she is partially aware but too weak to speak. She states that after even she feels severely fatigued for 5-10 mins and thereafter back to her baseline.  With episodes no she noted on chest pain , n/v/d/ dysuria fever/chills. Or shortness of breath. Of note patient has similar admission 7/18 and at that time evaluation was unrevealing and it was thought that these episodes were stress induced.   1/17.  When turning head to the left patient developed room spinning and dizziness.  Added standing dose meclizine and as needed Valium for vertigo.  When sitting up with physical therapy today, she developed dizziness decreased level of alertness and slurred speech.  Symptoms resolved after few minutes.  Assessment and Plan: * Benign positional vertigo Continue working with physical therapy.  Standing dose meclizine and as needed Valium.  Acute cystitis with hematuria Empirically on Rocephin.  Follow-up cultures.  Cholecystitis, chronic Patient has gallbladder drain in.  CKD (chronic kidney disease), stage IV (HCC) Creatinine 2.27 with a GFR of 23  Iron deficiency anemia Continue to monitor hemoglobin.  Last hemoglobin 9.7.  Hypokalemia Replaced  History of DVT (deep vein thrombosis) On Eliquis.  Repeat sonogram of the  lower extremity does not show any DVT.  Patient does have an elevated D-dimer but taking Eliquis.  Hypothyroidism, unspecified Continue levothyroxine  Rheumatoid arthritis ([place]) Continue Plaquenil  History of CVA (cerebrovascular accident) CT scan does not show any acute infarcts.  Chronic systolic CHF (congestive heart failure) (HCC) Restart Lasix and Coreg  Essential hypertension Blood pressure trending higher.  Restarted Coreg and Imdur this morning.  Will add Norvasc now and losartan this evening.  Uncontrolled type 2 diabetes mellitus with hypoglycemia, without long-term current use of insulin (HCC) Sliding scale insulin for now holding oral hypoglycemics.        Subjective: Patient seen this morning.  When she turned her head to the left she did get dizziness and room spinning.  Admitted with vertigo.  Physical Exam: Vitals:   01/12/23 1445 01/12/23 1500 01/12/23 1530 01/12/23 1632  BP: (!) 183/95 (!) 167/105 (!) 170/122 (!) 179/102  Pulse: 77 77 75 78  Resp: '19 16 20   '$ Temp:      TempSrc:      SpO2: 100% 100% 100%   Weight:       Physical Exam HENT:     Head: Normocephalic.     Mouth/Throat:     Pharynx: No oropharyngeal exudate.  Eyes:     General: Lids are normal.     Conjunctiva/sclera: Conjunctivae normal.  Cardiovascular:     Rate and Rhythm: Normal rate and regular rhythm.     Heart sounds: Normal heart sounds, S1 normal and S2 normal.  Pulmonary:     Breath sounds: No decreased breath sounds,  wheezing, rhonchi or rales.  Abdominal:     Palpations: Abdomen is soft.     Tenderness: There is no abdominal tenderness.  Musculoskeletal:     Right lower leg: No swelling.     Left lower leg: No swelling.  Skin:    General: Skin is warm.     Findings: No rash.  Neurological:     Mental Status: She is alert and oriented to person, place, and time.     Comments: Developed dizziness with turning her head to the left.  No nystagmus seen.     Data  Reviewed: Creatinine 2.27, BNP 2084, hemoglobin 9.7, D-dimer 3.74, TSH 7.464  Family Communication: Tried to call patient's son  Disposition: Status is: Inpatient Remains inpatient appropriate because: Patient with severe vertigo and unable to sit up without major symptoms.  Planned Discharge Destination: Possibly needing rehab    Time spent: 28 minutes  Author: Loletha Grayer, MD 01/12/2023 4:35 PM  For on call review [email or web address].

## 2023-01-12 NOTE — ED Notes (Signed)
Phlebotomy to bedside to help collect labs. However, OT currently working with pt. Lab staff states they will come back later to try again to collect blood.

## 2023-01-12 NOTE — Assessment & Plan Note (Signed)
Replaced.  Monitor BMP, replace K as needed.

## 2023-01-12 NOTE — Assessment & Plan Note (Addendum)
Sliding scale insulin during admission Resume glipizide at d/c

## 2023-01-12 NOTE — ED Notes (Signed)
Imaging personnel at bedside nearly finished. Will give meds to pt once imaging staff done and can sit pt up.

## 2023-01-12 NOTE — ED Notes (Signed)
225cc of dark brown fluid emptied from pt's drain bag. Pt states she usually empties it once in the morning and once before bed.

## 2023-01-12 NOTE — ED Notes (Signed)
Pt requesting for BG to be checked. BG was checked after pt ate peached and pineapple.

## 2023-01-12 NOTE — Assessment & Plan Note (Addendum)
Patient has gallbladder drain in.

## 2023-01-12 NOTE — Progress Notes (Signed)
*  PRELIMINARY RESULTS* Echocardiogram 2D Echocardiogram has been performed.  Tracey Bryant 01/12/2023, 2:13 PM

## 2023-01-12 NOTE — ED Notes (Signed)
Pt arrived to transport pt. Pt stable at time of departure.

## 2023-01-12 NOTE — Assessment & Plan Note (Addendum)
Switched Rocephin over to Keflex and completed course.  E. coli growing out of culture.

## 2023-01-12 NOTE — Assessment & Plan Note (Addendum)
Continue Plaquenil, sulfasalazine. Rheumatology follow up as scheduled.

## 2023-01-12 NOTE — Evaluation (Signed)
Physical Therapy Evaluation Patient Details Name: Tracey Bryant MRN: [id number] DOB: 1951/10/29 Today's Date: 01/12/2023  History of Present Illness  72 y/o female presented to ED on 01/11/23 for recurrent syncope. Admitted for possible UTI. PMH: arthritis, CHF, DM II, DVT, HTN, afib, and stroke. hx of cholecystitis now with chronic biliary drain  Clinical Impression  Patient admitted with the above. Agreeable to PT/OT evaluation to maximize patient/therapist safety and activity tolerance. PTA, patient living with sister after rehab stay where she utilizes RW modI within the home and w/c for community mobility. Received supine in bed. Able to clearly state PLOF, orientation questions, and follow commands at beginning of session. RN present to administer medications and patient required assistance to scoot higher in bed. Placed stretcher flat and scooted to [facility] with maxA+2. Once [facility] was elevated, patient with sudden dizziness and decreased level of alertness. Demonstrates slurred/incomprehensible speech, delayed responses, and lethargy. With increased time and and max cueing, patient able to state full name and DOB. Episode lasted 3-4 minutes with BP negative for orthostatics. Patient had another episode of similar findings but less intense after BP cuff was readjusted at end of session. Patient will benefit from skilled PT services during acute stay to address listed deficits. Recommend SNF at discharge to maximize functional independence and safety.        Recommendations for follow up therapy are one component of a multi-disciplinary discharge planning process, led by the attending physician.  Recommendations may be updated based on patient status, additional functional criteria and insurance authorization.  Follow Up Recommendations Skilled nursing-short term rehab (<3 hours/day) Can patient physically be transported by private vehicle: No    Assistance Recommended at Discharge Frequent or  constant Supervision/Assistance  Patient can return home with the following  A lot of help with walking and/or transfers;A lot of help with bathing/dressing/bathroom;Assistance with cooking/housework;Assist for transportation;Help with stairs or ramp for entrance;Direct supervision/assist for medications management;Direct supervision/assist for financial management    Equipment Recommendations None recommended by PT  Recommendations for Other Services       Functional Status Assessment Patient has had a recent decline in their functional status and demonstrates the ability to make significant improvements in function in a reasonable and predictable amount of time.     Precautions / Restrictions Precautions Precautions: Fall Precaution Comments: watch BP Restrictions Weight Bearing Restrictions: No      Mobility  Bed Mobility Overal bed mobility: Needs Assistance             General bed mobility comments: Max A +2 to scoot pt up toward [place] transfer comment: deferred 2/2 alertness/arousal    Ambulation/Gait                  Stairs            Wheelchair Mobility    Modified Rankin (Stroke Patients Only)       Balance                                             Pertinent Vitals/Pain Pain Assessment Pain Assessment: Faces Faces Pain Scale: Hurts a little bit Pain Location: BUEs with movement Pain Descriptors / Indicators: Grimacing, Guarding, Sore Pain Intervention(s): Monitored during session  Home Living Family/patient expects to be discharged to:: Private residence Living Arrangements: Other relatives (has been living with sister since recent D/C from rehab) Available Help at Discharge: Family;Available 24 hours/day Type of Home: House Home Access: Stairs to enter   Entrance Stairs-Number of Steps: small threshold   Home Layout: One level Home Equipment: Rollator (4  wheels);Shower seat;BSC/3in1;Grab bars - Statistician (2 wheels);Cane - single point Additional Comments: Above home set up is for pt's sisters house where she has been staying temporarily since D/C from rehab.    Prior Function Prior Level of Function : Needs assist             Mobility Comments: Pt reports receiving assistance for bed mobility and STS recently since D/C from rehab. ADLs Comments: Mod I ADLs, assist for IADLs from sister PRN, family provides transportation     Hand Dominance   Dominant Hand: Right    Extremity/Trunk Assessment   Upper Extremity Assessment Upper Extremity Assessment: Defer to OT evaluation    Lower Extremity Assessment Lower Extremity Assessment: Generalized weakness       Communication   Communication: No difficulties  Cognition Arousal/Alertness: Awake/alert Behavior During Therapy: WFL for tasks assessed/performed Overall Cognitive Status: Difficult to assess                                 General Comments: Alert and oriented at beginning of evaluation, answered questions/followed commands appropriately. Arousal/alertness changing during near syncopal episode, able to state full name and DOB with increased time and Max VC.        General Comments General comments (skin integrity, edema, etc.): BP reading taken t/o session all in supine. After onset of symptoms, BP: 177/101 (MAP 121). ~10 min later when symptoms appeared to be resolving: BP 187/103 (MAP 128). BP cuff switched to LUE to rule out incorrect reading. BP 178/104 (MAP 125) at end of session. RN present.    Exercises     Assessment/Plan    PT Assessment Patient needs continued PT services  PT Problem List Decreased strength;Decreased activity tolerance;Decreased balance;Decreased mobility;Decreased knowledge of use of DME;Decreased safety awareness;Decreased knowledge of precautions;Cardiopulmonary status limiting activity       PT Treatment  Interventions DME instruction;Gait training;Functional mobility training;Therapeutic activities;Therapeutic exercise;Balance training;Patient/family education    PT Goals (Current goals can be found in the Care Plan section)  Acute Rehab PT Goals Patient Stated Goal: to be able to go home PT Goal Formulation: With patient Time For Goal Achievement: 01/26/23 Potential to Achieve Goals: Fair    Frequency Min 2X/week     Co-evaluation PT/OT/SLP Co-Evaluation/Treatment: Yes Reason for Co-Treatment: For patient/therapist safety;To address functional/ADL transfers PT goals addressed during session: Mobility/safety with mobility OT goals addressed during session: ADL's and self-care       AM-PAC PT "6 Clicks" Mobility  Outcome Measure Help needed turning from your back to your side while in a flat bed without using bedrails?: Total Help needed moving from lying on your back to sitting on the side of a flat bed without using bedrails?: Total Help needed moving to and from a bed to a chair (including a wheelchair)?: Total Help needed standing up from a chair using your arms (e.g., wheelchair or bedside chair)?: Total Help needed to walk in hospital room?: Total Help needed climbing 3-5 steps with a railing? : Total 6 Click Score: 6    End of Session   Activity  Tolerance: Treatment limited secondary to medical complications (Comment) Patient left: in bed;with call bell/phone within reach Nurse Communication: Mobility status PT Visit Diagnosis: Muscle weakness (generalized) (M62.81);Unsteadiness on feet (R26.81);Difficulty in walking, not elsewhere classified (R26.2)    Time: 1561-5379 PT Time Calculation (min) (ACUTE ONLY): 26 min   Charges:   PT Evaluation $PT Eval Moderate Complexity: 1 Mod          Adarryl Goldammer A. Gilford Rile PT, DPT [facility] - Acute Rehabilitation Services   Evanny Ellerbe A Madolyn Ackroyd 01/12/2023, 2:32 PM

## 2023-01-12 NOTE — Assessment & Plan Note (Signed)
Continue levothyroxine 

## 2023-01-12 NOTE — ED Notes (Signed)
Attending Dunlap notified of elevated BNP, TSH and D-dimer results.

## 2023-01-12 NOTE — ED Provider Notes (Incomplete)
[facility] Provider Note    Event Date/Time   First MD Initiated Contact with Patient 01/11/23 1742     (approximate)   History   Loss of Consciousness   HPI  Tracey Bryant is a 72 y.o. female with PMHx vasovagal syncope here with weakness. Pt has had recurrent episodes of syncope today. She has a h/o same but sx worse today. Pt was at her rheumatologist appointment today getting labs drawn. They had a hard time getting blood. They were done attempting and sat pt up and she acutely lost consciousness. She has had recurrent episodes of feeling like she is going to pass out with position changes since then. This occurs in the setting of recently having issues with the drainage from her GB IR       Physical Exam   Triage Vital Signs: ED Triage Vitals  Enc Vitals Group     BP 01/11/23 1618 (!) 176/95     Pulse Rate 01/11/23 1618 78     Resp 01/11/23 1618 18     Temp 01/11/23 1618 98.4 F (36.9 C)     Temp Source 01/11/23 1618 Oral     SpO2 01/11/23 1618 97 %     Weight 01/11/23 1618 187 lb (84.8 kg)     Height --      Head Circumference --      Peak Flow --      Pain Score 01/11/23 1617 8     Pain Loc --      Pain Edu? --      Excl. in [place]? --     Most recent vital signs: Vitals:   01/11/23 1618 01/11/23 1745  BP: (!) 176/95 (!) 167/88  Pulse: 78 74  Resp: 18 14  Temp: 98.4 F (36.9 C)   SpO2: 97% 100%     General: Awake, no distress. *** CV:  Good peripheral perfusion. *** Resp:  Normal effort. *** Abd:  No distention. *** Other:  ***   ED Results / Procedures / Treatments   Labs (all labs ordered are listed, but only abnormal results are displayed) Labs Reviewed  URINALYSIS, ROUTINE W REFLEX MICROSCOPIC  CBG MONITORING, ED     EKG ***   RADIOLOGY ***   ***I also independently reviewed and agree with radiologist interpretations.   PROCEDURES:  Critical Care performed: {CriticalCareYesNo:19197::"Yes, see  critical care procedure note(s)","No"}  ***   MEDICATIONS ORDERED IN ED: Medications - No data to display   IMPRESSION / MDM / [place] / ED COURSE  I reviewed the triage vital signs and the nursing notes.                              Differential diagnosis includes, but is not limited to, ***  Patient's presentation is most consistent with {EM COPA:27473}  {If the patient is on the monitor, remove the brackets and asterisks on the sentence below and remember to document it as a Procedure as well. Otherwise delete the sentence below:1} {**The patient is on the cardiac monitor to evaluate for evidence of arrhythmia and/or significant heart rate changes.**} {Remember to include, when applicable, any/all of the following data: independent review of imaging independent review of labs (comment specifically on pertinent positives and negatives) review of specific prior hospitalizations, PCP/specialist notes, etc. discuss meds given and prescribed document any discussion with consultants (including hospitalists) any clinical decision tools you used  and why (PECARN, NEXUS, etc.) did you consider admitting the patient? document social determinants of health affecting patient's care (homelessness, inability to follow up in a timely fashion, etc) document any pre-existing conditions increasing risk on current visit (e.g. diabetes and HTN increasing danger of high-risk chest pain/ACS) describes what meds you gave (especially parenteral) and why any other interventions?:1}     FINAL CLINICAL IMPRESSION(S) / ED DIAGNOSES   Final diagnoses:  None     Rx / DC Orders   ED Discharge Orders     None        Note:  This document was prepared using Dragon voice recognition software and may include unintentional dictation errors.

## 2023-01-12 NOTE — Assessment & Plan Note (Addendum)
Patient orthostatic today.  Will give fluid bolus.  Continue Coreg, Imdur.  Discontinue Norvasc.

## 2023-01-12 NOTE — Assessment & Plan Note (Deleted)
Continue working with physical therapy.  Standing dose meclizine and as needed Valium.

## 2023-01-12 NOTE — ED Notes (Signed)
Pt transferred to hospital bed and placed on monitor. Purewick in place. Echo tech at bedside to perform echo at this time.

## 2023-01-12 NOTE — ED Notes (Signed)
Will give hydroxychloroquine once received from pharm.

## 2023-01-12 NOTE — Assessment & Plan Note (Addendum)
Continue to monitor hemoglobin.   Given IV iron 1/22 and again today 1/26. Hbg improved today 7.0 >> 6.9 >> 7.2. Pt declines RBC transfusion as she is Jehovah's witness. Stopped Eliquis after re-bleed on low dose. Hbg today improved to 7.5 Repeat CBC within 1 week

## 2023-01-12 NOTE — Assessment & Plan Note (Addendum)
Continue Coreg, last EF 40%.   Hold Lasix until renal fx improves. 1/26: worsening edema, stop gentle fluids and will consider diuresis tomorrow if needed 1/27-28: edema better off fluids Resume Lasix at d/c (tomorrow)

## 2023-01-12 NOTE — Assessment & Plan Note (Deleted)
Creatinine 2.57 with a GFR of 19

## 2023-01-12 NOTE — ED Notes (Signed)
Pt had two episodes of extreme dizziness and slurred speech while OT/PT worked with her. One episode happened immediately after this RN and PT/OT staff helped reposition pt in bed to take meds. Had to wait about 3-4 minutes for before giving meds after sitting pt up as she was very dizzy. Pt could answer questions but her speech was nearly incomprehensible. After about 3 minutes pt's speech clear and she denied dizziness; pt tolerated swallowing pills well. Later, near end of their session, PT called this RN to notify pt had one more episode of dizziness while working with them but this time pt hadn't made any recent position changes when it occurred. PT states that similar to earlier episode it only lasted about 2-3 minutes.

## 2023-01-13 DIAGNOSIS — N3001 Acute cystitis with hematuria: Secondary | ICD-10-CM | POA: Diagnosis not present

## 2023-01-13 DIAGNOSIS — K811 Chronic cholecystitis: Secondary | ICD-10-CM | POA: Diagnosis not present

## 2023-01-13 DIAGNOSIS — R569 Unspecified convulsions: Secondary | ICD-10-CM

## 2023-01-13 DIAGNOSIS — N184 Chronic kidney disease, stage 4 (severe): Secondary | ICD-10-CM | POA: Diagnosis not present

## 2023-01-13 DIAGNOSIS — D508 Other iron deficiency anemias: Secondary | ICD-10-CM

## 2023-01-13 LAB — ECHOCARDIOGRAM COMPLETE
AR max vel: 3.16 cm2
AV Area VTI: 3.06 cm2
AV Area mean vel: 2.94 cm2
AV Mean grad: 2 mmHg
AV Peak grad: 2.8 mmHg
Ao pk vel: 0.83 m/s
Area-P 1/2: 5.42 cm2
MV VTI: 2.04 cm2
S' Lateral: 3.9 cm
Weight: 2992 oz

## 2023-01-13 LAB — HEMOGLOBIN A1C
Hgb A1c MFr Bld: 5.5 % (ref 4.8–5.6)
Mean Plasma Glucose: 111.15 mg/dL

## 2023-01-13 LAB — GLUCOSE, CAPILLARY
Glucose-Capillary: 65 mg/dL — ABNORMAL LOW (ref 70–99)
Glucose-Capillary: 81 mg/dL (ref 70–99)
Glucose-Capillary: 68 mg/dL — ABNORMAL LOW (ref 70–99)
Glucose-Capillary: 124 mg/dL — ABNORMAL HIGH (ref 70–99)
Glucose-Capillary: 132 mg/dL — ABNORMAL HIGH (ref 70–99)
Glucose-Capillary: 152 mg/dL — ABNORMAL HIGH (ref 70–99)
Glucose-Capillary: 113 mg/dL — ABNORMAL HIGH (ref 70–99)

## 2023-01-13 MED ORDER — INSULIN ASPART 100 UNIT/ML IJ SOLN
0.0000 [IU] | Freq: Three times a day (TID) | INTRAMUSCULAR | Status: DC
  Administered 2023-01-13: 2 [IU] via SUBCUTANEOUS
  Administered 2023-01-13 – 2023-01-15 (×2): 1 [IU] via SUBCUTANEOUS
  Administered 2023-01-15: 3 [IU] via SUBCUTANEOUS
  Administered 2023-01-16: 2 [IU] via SUBCUTANEOUS
  Administered 2023-01-16: 1 [IU] via SUBCUTANEOUS
  Administered 2023-01-17 (×2): 2 [IU] via SUBCUTANEOUS
  Administered 2023-01-17: 3 [IU] via SUBCUTANEOUS
  Administered 2023-01-18: 2 [IU] via SUBCUTANEOUS
  Administered 2023-01-18 – 2023-01-19 (×3): 3 [IU] via SUBCUTANEOUS
  Administered 2023-01-19: 2 [IU] via SUBCUTANEOUS
  Administered 2023-01-20: 1 [IU] via SUBCUTANEOUS
  Administered 2023-01-20 – 2023-01-21 (×3): 2 [IU] via SUBCUTANEOUS
  Administered 2023-01-22: 3 [IU] via SUBCUTANEOUS
  Administered 2023-01-22 – 2023-01-23 (×3): 1 [IU] via SUBCUTANEOUS
  Filled 2023-01-13 (×24): qty 1

## 2023-01-13 MED ORDER — MEDIHONEY WOUND/BURN DRESSING EX PSTE
1.0000 | PASTE | Freq: Every day | CUTANEOUS | Status: DC
  Administered 2023-01-13 – 2023-01-23 (×9): 1 via TOPICAL
  Filled 2023-01-13: qty 44

## 2023-01-13 MED ORDER — INSULIN ASPART 100 UNIT/ML IJ SOLN
0.0000 [IU] | Freq: Every day | INTRAMUSCULAR | Status: DC
  Administered 2023-01-15: 3 [IU] via SUBCUTANEOUS
  Administered 2023-01-16 – 2023-01-17 (×2): 2 [IU] via SUBCUTANEOUS
  Administered 2023-01-19: 3 [IU] via SUBCUTANEOUS
  Administered 2023-01-20 – 2023-01-22 (×2): 2 [IU] via SUBCUTANEOUS
  Filled 2023-01-13 (×7): qty 1

## 2023-01-13 NOTE — Consult Note (Addendum)
WOC Nurse Consult Note: Reason for Consult: Consult requested for left heel Wound type: Chronic Unstageable pressure injury; 1.5X2.5cm, small amt tan drainage, loose foul-smelling slough to outer wound bed removes easily when scrubbed with moist gauze, revealing 100% loose soft eschar, no fluctuance when probed. Erythremia surrounding the wound bed. Pressure Injury POA: Yes Dressing procedure/placement/frequency: Pt states she "already has a foam boot at home to offload pressure to the affected area and does not need another, and prefers to hang the foot off the bed at this time to avoid pressure."  Topical treatment ordered for bedside nurses to perform as follows to assist with removal of nonviable tissue: Apply Medihoney to left heel wound Q day, then cover with moist 2X2 and foam dressing.  (Change foam dressing Q 3 days or PRN soiling. ) Please re-consult if further assistance is needed.  Thank-you,  Julien Girt MSN, [place], [place], [place], [place]

## 2023-01-13 NOTE — Progress Notes (Signed)
Progress Note   Patient: Tracey Bryant TGG:[id number] DOB: 1951/05/29 DOA: 01/11/2023     2 DOS: the patient was seen and examined on 01/13/2023   Brief hospital course: 72 y.o. female with medical history significant of  CHF pef , DMII, DVT, HTN , Pafib on Eliquis, CKDIIIb +PPM,h/o CVA x 3, hx of cholecystitis now with chronic biliary drain,history of recurrent syncope followed by neuro who presents to ED in referral from rheumatology clinic s/p recurrent episode of syncope. Patient states she had these episode for over a year and noted that these episodes are not frequent and occur every 4-6 months. She state she does get an aura prior to even and that during even she states she is partially aware but too weak to speak. She states that after even she feels severely fatigued for 5-10 mins and thereafter back to her baseline.  With episodes no she noted on chest pain , n/v/d/ dysuria fever/chills. Or shortness of breath. Of note patient has similar admission 7/18 and at that time evaluation was unrevealing and it was thought that these episodes were stress induced.   1/17.  When turning head to the left patient developed room spinning and dizziness.  Added standing dose meclizine and as needed Valium for vertigo.  When sitting up with physical therapy today, she developed dizziness decreased level of alertness and slurred speech.  Symptoms resolved after few minutes. 1/18.  When working with physical therapy and they sat her up she had shaking episode and became less responsive and some slurred speech.  We checked her blood pressure when this happened and it did not drop.  Patient had similar episode when I went back into the room to see her.  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  Assessment and Plan: *Psychogenic nonepileptic events. Case discussed with neurology and had prior workup with long-term EEG which did not  show any seizure activity.  Continue supportive care.  Acute cystitis with hematuria Empirically on Rocephin.  Follow-up cultures.  Cholecystitis, chronic Patient has gallbladder drain in.  CKD (chronic kidney disease), stage IV (HCC) Creatinine 2.27 with a GFR of 23  Iron deficiency anemia Continue to monitor hemoglobin.  Last hemoglobin 9.7.  Hypokalemia Replaced  History of DVT (deep vein thrombosis) On Eliquis.  Repeat sonogram of the lower extremity does not show any DVT.  Patient does have an elevated D-dimer but taking Eliquis.  Hypothyroidism, unspecified Continue levothyroxine  Rheumatoid arthritis ([place]) Continue Plaquenil  History of CVA (cerebrovascular accident) CT scan does not show any acute infarcts.  Chronic systolic CHF (congestive heart failure) (HCC) Restart Lasix and Coreg  Essential hypertension Blood pressure trending higher.  Restarted Coreg and Imdur this morning.  Will add Norvasc now and losartan this evening.  Uncontrolled type 2 diabetes mellitus with hypoglycemia, without long-term current use of insulin (HCC) Sliding scale insulin for now holding oral hypoglycemics.        Subjective: Patient did not get dizzy with turning her head to the left when I was in the room.  With working with physical therapy and sitting up she had shaking episode and became less responsive.  I came back to the room to witness this we had physical therapy set her up again blood pressure did not drop she did have a shaking episode and no loss of bowel or bladder function.  She was less responsive initially and some slow speech but then  came around.  Physical Exam: Vitals:   01/13/23 1159 01/13/23 1212 01/13/23 1215 01/13/23 1630  BP: (!) 165/87 (!) 149/84 (!) 156/100 (!) 152/80  Pulse: 83 83 87 77  Resp: 20   18  Temp:    97.6 F (36.4 C)  TempSrc:    Oral  SpO2: 99% 98%  98%  Weight:      Height:       Physical Exam HENT:     Head: Normocephalic.      Mouth/Throat:     Pharynx: No oropharyngeal exudate.  Eyes:     General: Lids are normal.     Conjunctiva/sclera: Conjunctivae normal.  Cardiovascular:     Rate and Rhythm: Normal rate and regular rhythm.     Heart sounds: Normal heart sounds, S1 normal and S2 normal.  Pulmonary:     Breath sounds: No decreased breath sounds, wheezing, rhonchi or rales.  Abdominal:     Palpations: Abdomen is soft.     Tenderness: There is no abdominal tenderness.  Musculoskeletal:     Right lower leg: No swelling.     Left lower leg: No swelling.  Skin:    General: Skin is warm.     Findings: No rash.     Comments: Left heel ulceration with black eschar.  Neurological:     Mental Status: She is alert and oriented to person, place, and time.     Comments: Answers questions appropriately.  Was able to lift her knees up off the bed.     Data Reviewed: No new data today  Family Communication: Unable to reach the patient's son or daughter on the phone  Disposition: Status is: Inpatient Remains inpatient appropriate because: Patient having psychogenic nonepileptic events.  Planned Discharge Destination: Rehab    Time spent: 28 minutes  Author: Loletha Grayer, MD 01/13/2023 5:27 PM  For on call review [email or web address].

## 2023-01-13 NOTE — Progress Notes (Signed)
   01/13/23 1100  Spiritual Encounters  Type of Visit Initial  Care provided to: Patient  Referral source Nurse (RN/NT/LPN)  Reason for visit Advance directives  OnCall Visit No   Chaplain responded to nurse consult for advance directives. Chaplain provided some education about AD and HCPOA but patient was very sleepy and was not able to engage in discussion. Chaplain left AD paperwork. Chaplain services are available for follow up if needed.

## 2023-01-13 NOTE — Progress Notes (Signed)
Physical Therapy Treatment Patient Details Name: Tracey Bryant MRN: [id number] DOB: 1951-03-05 Today's Date: 01/13/2023   History of Present Illness 72 y/o female presented to ED on 01/11/23 for recurrent syncope. Admitted for possible UTI. PMH: arthritis, CHF, DM II, DVT, HTN, afib, and stroke. hx of cholecystitis now with chronic biliary drain    PT Comments    Patient continues to be limited by medical issues limiting mobility. On attempt to sit EOB with minA, patient began to demonstrate jerking (seizure like) movements, decreased level of alertness/arousal, incomprehensible speech, and delayed responses. RN present to assess. This therapist spoke to MD via phone. Patient required assistance for pericare and resulted in another episode with similar presentation after rolling with maxA. MD present and requesting BP in sitting. Patient attempted without assistance and experienced another episode with turning towards R. Required totalA+2 to place patient in sitting position and to maintain sitting during episode with similar movements. BP stable at 0 and 2 minutes (see vitals flowsheet) of sitting EOB but deferred further mobility. MD, RN, and student RN observed episodes. Continue to recommend SNF for ongoing Physical Therapy.      Recommendations for follow up therapy are one component of a multi-disciplinary discharge planning process, led by the attending physician.  Recommendations may be updated based on patient status, additional functional criteria and insurance authorization.  Follow Up Recommendations  Skilled nursing-short term rehab (<3 hours/day) Can patient physically be transported by private vehicle: No   Assistance Recommended at Discharge Frequent or constant Supervision/Assistance  Patient can return home with the following A lot of help with walking and/or transfers;A lot of help with bathing/dressing/bathroom;Assistance with cooking/housework;Assist for transportation;Help  with stairs or ramp for entrance;Direct supervision/assist for medications management;Direct supervision/assist for financial management   Equipment Recommendations  None recommended by PT    Recommendations for Other Services       Precautions / Restrictions Precautions Precautions: Fall Precaution Comments: seizure like activity with movement Restrictions Weight Bearing Restrictions: No     Mobility  Bed Mobility Overal bed mobility: Needs Assistance Bed Mobility: Supine to Sit, Sit to Supine, Rolling Rolling: Max assist   Supine to sit: Min assist, Total assist, +2 for physical assistance, +2 for safety/equipment Sit to supine: Total assist, +2 for physical assistance, +2 for safety/equipment   General bed mobility comments: initially requiring minA for LE management towards EOB but on attempt to sit up, patient with jerking movements (seizure like activity), decreased level of alertness/arousal, incomprehensible speech, and delayed responses. RN and Advertising copywriter present. After 3-4 minutes, patient back to baseline but lethargic. Required maxA to roll for pericare with similar presentation after rolling towards L. MD present and requesting BP assessed sitting EOB. Allowed patient to initiate bed mobility with another episode of similar presentation with movement towards the R. Required totalA+2 to come into sitting EOB and to maintain sitting during episode. BP stable x 0 minutes and 2 minutes. Returned to supine with totalA+2    Transfers                   General transfer comment: deferred    Ambulation/Gait                   Stairs             Wheelchair Mobility    Modified Rankin (Stroke Patients Only)       Balance Overall balance assessment: Needs assistance Sitting-balance support: No upper extremity supported, Feet supported  Sitting balance-Leahy Scale: Fair Sitting balance - Comments: initially totalA+2 during episode described in bed  mobility section. Once with improved alertness, able to sit min guard                                    Cognition Arousal/Alertness: Awake/alert, Lethargic Behavior During Therapy: WFL for tasks assessed/performed, Flat affect Overall Cognitive Status: Difficult to assess                                 General Comments: Alert and oriented, following all commands appropriately at beginning of session. With attempts at mobility, patient with jerking (seizure like activity) movements and decreased level of alertness/arousal. Slow to respond and incomprehensible speech. Patient reports being able to hear during episode but difficulty speaking        Exercises      General Comments        Pertinent Vitals/Pain Pain Assessment Pain Assessment: Faces Faces Pain Scale: Hurts a little bit Pain Location: BUEs with movement Pain Descriptors / Indicators: Grimacing, Guarding, Sore Pain Intervention(s): Monitored during session    Home Living                          Prior Function            PT Goals (current goals can now be found in the care plan section) Acute Rehab PT Goals PT Goal Formulation: With patient Time For Goal Achievement: 01/26/23 Potential to Achieve Goals: Fair Progress towards PT goals: Not progressing toward goals - comment    Frequency    Min 2X/week      PT Plan Current plan remains appropriate    Co-evaluation              AM-PAC PT "6 Clicks" Mobility   Outcome Measure  Help needed turning from your back to your side while in a flat bed without using bedrails?: Total Help needed moving from lying on your back to sitting on the side of a flat bed without using bedrails?: Total Help needed moving to and from a bed to a chair (including a wheelchair)?: Total Help needed standing up from a chair using your arms (e.g., wheelchair or bedside chair)?: Total Help needed to walk in hospital room?:  Total Help needed climbing 3-5 steps with a railing? : Total 6 Click Score: 6    End of Session   Activity Tolerance: Treatment limited secondary to medical complications (Comment) Patient left: in bed;with call bell/phone within reach;with nursing/sitter in room Nurse Communication: Mobility status;Other (comment) (seizure like activity and decreased level of alertness with mobility) PT Visit Diagnosis: Muscle weakness (generalized) (M62.81);Unsteadiness on feet (R26.81);Difficulty in walking, not elsewhere classified (R26.2)     Time: 6283-6629 PT Time Calculation (min) (ACUTE ONLY): 42 min  Charges:  $Therapeutic Activity: 38-52 mins                     Urian Martenson A. Gilford Rile PT, DPT [facility] - Acute Rehabilitation Services    Jibreel Fedewa A Janus Vlcek 01/13/2023, 1:20 PM

## 2023-01-13 NOTE — TOC Initial Note (Addendum)
Transition of Care [facility]) - Initial/Assessment Note    Patient Details  Name: Tracey Bryant MRN: [id number] Date of Birth: November 03, 1951  Transition of Care [facility]) CM/SW Contact:    Candie Chroman, LCSW Phone Number: 01/13/2023, 12:00 PM  Clinical Narrative:  Readmission prevention screen complete. CSW met with patient. No supports at bedside. CSW introduced role and explained that discharge planning would be discussed. PCP is Suzzanne Cloud, FNP in [place]. Sister transports her to appointments. Pharmacy is one of the [facility] in [place]. No issues obtaining medications. Per Patient Pearletha Forge, patient is active with [facility]. Patient confirmed this. [place] liaison is checking which services she is receiving. PT recommending SNF placement but patient was at [facility] 11/8-12/7 (29 days) so she is currently in her copay days and confirms she would not be able to afford them. Patient prefers to return home with her sister. Patient stated someone is always with her between her sister and nephew. Patient has a standard walker, hospital bed, elevated toilet seat, and a built-in shower seat. No further concerns. CSW encouraged patient to contact CSW as needed. CSW will continue to follow patient for support and facilitate return home once stable. Sister will transport her home once stable.       3:11 pm: Patient is active with [facility] for PT, OT, RN.        Expected Discharge Plan: [place] Barriers to Discharge: Continued Medical Work up   Patient Goals and CMS Choice     Choice offered to / list presented to : NA      Expected Discharge Plan and Services     Post Acute Care Choice: Resumption of Svcs/PTA Provider Living arrangements for the past 2 months: Single Family Home                                      Prior Living Arrangements/Services Living arrangements for the past 2 months: Single Family Home Lives with::  Siblings Patient language and need for interpreter reviewed:: Yes Do you feel safe going back to the place where you live?: Yes      Need for Family Participation in Patient Care: Yes (Comment) Care giver support system in place?: Yes (comment) Current home services: DME, Other (comment) (Home health services pending.) Criminal Activity/Legal Involvement Pertinent to Current Situation/Hospitalization: No - Comment as needed  Activities of Daily Living Home Assistive Devices/Equipment: Walker (specify type) ADL Screening (condition at time of admission) Patient's cognitive ability adequate to safely complete daily activities?: Yes Is the patient deaf or have difficulty hearing?: No Does the patient have difficulty seeing, even when wearing glasses/contacts?: No Does the patient have difficulty concentrating, remembering, or making decisions?: No Patient able to express need for assistance with ADLs?: Yes Does the patient have difficulty dressing or bathing?: Yes Independently performs ADLs?: No Communication: Independent Dressing (OT): Needs assistance Is this a change from baseline?: Pre-admission baseline Grooming: Needs assistance Is this a change from baseline?: Pre-admission baseline Feeding: Independent Bathing: Needs assistance Is this a change from baseline?: Pre-admission baseline Toileting: Needs assistance Is this a change from baseline?: Pre-admission baseline In/Out Bed: Needs assistance Is this a change from baseline?: Pre-admission baseline Walks in Home: Needs assistance Is this a change from baseline?: Pre-admission baseline Does the patient have difficulty walking or climbing stairs?: Yes Weakness of Legs: Both Weakness of Arms/Hands:  Both  Permission Sought/Granted Permission sought to share information with : Facility Art therapist granted to share information with : Yes, Verbal Permission Granted     Permission granted to share info w  AGENCY: [place]        Emotional Assessment Appearance:: Appears stated age Attitude/Demeanor/Rapport: Engaged, Gracious Affect (typically observed): Accepting, Appropriate, Calm, Pleasant Orientation: : Oriented to Self, Oriented to Place, Oriented to  Time, Oriented to Situation Alcohol / Substance Use: Not Applicable Psych Involvement: No (comment)  Admission diagnosis:  Syncope and collapse [R55] Syncope [R55] Acute cystitis without hematuria [N30.00] Patient Active Problem List   Diagnosis Date Noted   Benign positional vertigo 01/12/2023   Acute cystitis with hematuria 01/12/2023   CKD (chronic kidney disease), stage IV ([place]) 01/12/2023   Rheumatoid arthritis ([place]) 01/12/2023   Hypothyroidism, unspecified 01/12/2023   History of DVT (deep vein thrombosis) 01/12/2023   Hypokalemia 01/12/2023   Iron deficiency anemia 01/12/2023   Seizures ([place]) 74/16/3845   Acute diastolic HF (heart failure) ([place]) 06/27/2022   Acute CVA (cerebrovascular accident) ([employer]) 06/09/2022   Dizziness 05/25/2022   History of CVA (cerebrovascular accident) 05/22/2022   Physical deconditioning 05/22/2022   Acute on chronic congestive heart failure ([place]) 36/46/8032   Chronic systolic CHF (congestive heart failure) ([place]) 05/19/2022   Acute respiratory failure with hypoxia ([place]) 12/26/2021   Elevated troponin 12/26/2021   Elevated brain natriuretic peptide (BNP) level 12/26/2021   Hypoalbuminemia due to protein-calorie malnutrition ([place]) 12/26/2021   Acute renal failure superimposed on stage 4 chronic kidney disease ([place]) 12/26/2021   Acute exacerbation of CHF (congestive heart failure) ([place]) 12/25/2021   Acute on chronic HFrEF (heart failure with reduced ejection fraction) ([place]) 07/27/2021   Acute on chronic combined systolic and diastolic congestive heart failure ([place]) 07/26/2021   Paroxysmal atrial fibrillation ([place]) 07/26/2021   DVT (deep venous thrombosis) ([place]) 07/26/2021   Normocytic  anemia 07/26/2021   Cholecystostomy tube dysfunction 07/26/2021   Moderate protein-calorie malnutrition ([place]) 07/26/2021   Posterior cerebral artery embolism, right    Severe sepsis ([place]) 03/19/2021   Cholecystitis, chronic 03/19/2021   Acute lower UTI 03/19/2021   Cholelithiasis without cholangitis 03/18/2021   Uncontrolled type 2 diabetes mellitus with hyperglycemia, with long-term current use of insulin ([place]) 03/18/2021   DKA (diabetic ketoacidosis) ([place]) 03/16/2021   AKI (acute kidney injury) ([place]) 03/16/2021   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[place]) 03/16/2021   Acute posthemorrhagic anemia 03/16/2021   Epistaxis 03/16/2021   Chronic anticoagulation 03/16/2021   CVA (cerebral vascular accident) ([place]) 03/16/2021   Rheumatoid arteritis ([place]) 02/15/2013   Uncontrolled type 2 diabetes mellitus with hypoglycemia, without long-term current use of insulin ([place]) 02/15/2013   Essential hypertension 02/15/2013   Gall bladder stones 02/15/2013   Multiple lung nodules 02/15/2013   PCP:  Coolidge Breeze, FNP Pharmacy:   [facility] [address] [place] [address] [place] [address] Phone: [phone] Fax: [phone]     Social Determinants of Health (SDOH) Social History: SDOH Screenings   Food Insecurity: No Food Insecurity (01/12/2023)  Housing: Low Risk  (01/12/2023)  Transportation Needs: No Transportation Needs (01/12/2023)  Utilities: Not At Risk (01/12/2023)  Tobacco Use: Low Risk  (01/12/2023)   SDOH Interventions:     Readmission Risk Interventions    01/13/2023   11:57 AM 12/27/2021    2:32 PM 03/19/2021    9:34 AM  Readmission Risk Prevention Plan  Transportation Screening Complete Complete  PCP or Specialist Appt within 5-7 Days   Complete  PCP or Specialist Appt within 3-5 Days  Complete   Home Care Screening   Complete  HRI or Home Care Consult  Complete   Social Work Consult for [place] Planning/Counseling   Complete   Palliative Care Screening  Not Applicable   Medication Review Press photographer) Complete Complete   PCP or Specialist appointment within 3-5 days of discharge Complete    HRI or [place] Complete    SW Recovery Care/Counseling Consult Complete    Palliative Care Screening Not [place] Patient Refused

## 2023-01-13 NOTE — Progress Notes (Addendum)
   01/13/23 0541  Provider Notification  Provider Name/Title B. Randol Kern  Date Provider Notified 01/13/23  Time Provider Notified [phone]  Method of Notification Page (Secure chat)  Notification Reason Other (Comment) (CBG 65)  Provider response No new orders (Hypoglycemia protocol activated)  Date of Provider Response 01/13/23  Time of Provider Response 0540   Orange juice provided. Will recheck CBG in 15 minutes. \ CBG rechecked at 0601: '81mg'$ /dl. Patient resting well.

## 2023-01-13 NOTE — Plan of Care (Signed)

## 2023-01-14 DIAGNOSIS — N3001 Acute cystitis with hematuria: Secondary | ICD-10-CM | POA: Diagnosis not present

## 2023-01-14 DIAGNOSIS — K811 Chronic cholecystitis: Secondary | ICD-10-CM | POA: Diagnosis not present

## 2023-01-14 DIAGNOSIS — E2740 Unspecified adrenocortical insufficiency: Secondary | ICD-10-CM | POA: Diagnosis not present

## 2023-01-14 DIAGNOSIS — R569 Unspecified convulsions: Secondary | ICD-10-CM | POA: Diagnosis not present

## 2023-01-14 LAB — URINE CULTURE: Culture: >=100000 — AB

## 2023-01-14 LAB — GLUCOSE, CAPILLARY
Glucose-Capillary: 140 mg/dL — ABNORMAL HIGH (ref 70–99)
Glucose-Capillary: 88 mg/dL (ref 70–99)
Glucose-Capillary: 90 mg/dL (ref 70–99)
Glucose-Capillary: 167 mg/dL — ABNORMAL HIGH (ref 70–99)
Glucose-Capillary: 169 mg/dL — ABNORMAL HIGH (ref 70–99)

## 2023-01-14 LAB — CORTISOL-AM, BLOOD: Cortisol - AM: 1.7 ug/dL — ABNORMAL LOW (ref 6.7–22.6)

## 2023-01-14 MED ORDER — HYDROCORTISONE 10 MG PO TABS
20.0000 mg | ORAL_TABLET | Freq: Every day | ORAL | Status: DC
  Administered 2023-01-15 – 2023-01-23 (×9): 20 mg via ORAL
  Filled 2023-01-14 (×9): qty 2

## 2023-01-14 MED ORDER — HYDROCORTISONE 10 MG PO TABS
10.0000 mg | ORAL_TABLET | Freq: Once | ORAL | Status: AC
  Administered 2023-01-14: 10 mg via ORAL
  Filled 2023-01-14: qty 1

## 2023-01-14 MED ORDER — SODIUM CHLORIDE 0.9 % IV SOLN: INTRAVENOUS | Status: DC | PRN

## 2023-01-14 MED ORDER — HYDROCORTISONE 10 MG PO TABS
10.0000 mg | ORAL_TABLET | Freq: Every day | ORAL | Status: DC
  Administered 2023-01-15 – 2023-01-22 (×8): 10 mg via ORAL
  Filled 2023-01-14 (×9): qty 1

## 2023-01-14 MED ORDER — AMLODIPINE BESYLATE 5 MG PO TABS
2.5000 mg | ORAL_TABLET | Freq: Every day | ORAL | Status: DC
  Administered 2023-01-14 – 2023-01-18 (×5): 2.5 mg via ORAL
  Filled 2023-01-14 (×5): qty 1

## 2023-01-14 NOTE — Care Management Important Message (Signed)
Important Message  Patient Details  Name: Tracey Bryant MRN: [id number] Date of Birth: 1951/06/04   Medicare Important Message Given:  Yes     Dannette Barbara 01/14/2023, 10:47 AM

## 2023-01-14 NOTE — Progress Notes (Signed)
Physical Therapy Treatment Patient Details Name: Tracey Bryant MRN: [id number] DOB: 09/11/1951 Today's Date: 01/14/2023   History of Present Illness Pt is a 72 y/o female presented to ED on 01/11/23 for recurrent syncope. Admitted for possible UTI. PMH: arthritis, CHF, DM II, DVT, HTN, afib, and stroke. hx of cholecystitis now with chronic biliary drain    PT Comments    Pt was pleasant and motivated to participate during the session and put forth good effort throughout. Pt required physical assistance to come to sitting at the EOB with log roll training provided.  Once in sitting as per prior sessions the pt became minimally shaky and mumbled her words somewhat but was back to baseline after sitting up around 20 sec.  From there the pt was able to stand twice and during the second time in standing was able to take several steps at the EOB all with no adverse symptoms other than the pain in her L heel.  Pt will benefit from PT services in a SNF setting upon discharge to safely address deficits listed in patient problem list for decreased caregiver assistance and eventual return to PLOF.     Recommendations for follow up therapy are one component of a multi-disciplinary discharge planning process, led by the attending physician.  Recommendations may be updated based on patient status, additional functional criteria and insurance authorization.  Follow Up Recommendations  Skilled nursing-short term rehab (<3 hours/day) Can patient physically be transported by private vehicle: No   Assistance Recommended at Discharge Frequent or constant Supervision/Assistance  Patient can return home with the following A lot of help with walking and/or transfers;A lot of help with bathing/dressing/bathroom;Assistance with cooking/housework;Assist for transportation;Help with stairs or ramp for entrance;Direct supervision/assist for medications management;Direct supervision/assist for financial management    Equipment Recommendations  None recommended by PT    Recommendations for Other Services       Precautions / Restrictions Precautions Precautions: Fall Precaution Comments: seizure like activity with movement Restrictions Weight Bearing Restrictions: No     Mobility  Bed Mobility Overal bed mobility: Needs Assistance Bed Mobility: Rolling, Sidelying to Sit, Sit to Sidelying Rolling: Min assist Sidelying to sit: Mod assist     Sit to sidelying: Mod assist General bed mobility comments: Mod A and mod verbal cues for log roll sequencing to the left    Transfers Overall transfer level: Needs assistance Equipment used: Rolling walker (2 wheels) Transfers: Sit to/from Stand Sit to Stand: Min assist           General transfer comment: Mod verbal cues for hand placement and increased trunk flex with pt requiring min A for first stand and CGA only for second stand    Ambulation/Gait Ambulation/Gait assistance: Min assist Gait Distance (Feet): 4 Feet Assistive device: Rolling walker (2 wheels) Gait Pattern/deviations: Step-to pattern, Decreased step length - right, Decreased step length - left, Trunk flexed, Shuffle Gait velocity: decreased     General Gait Details: Pt able to amb with min A for stability grossly 4 feet near the EOB before fatiguing and needing to return to sitting but no adverse symptoms with gait   Stairs             Wheelchair Mobility    Modified Rankin (Stroke Patients Only)       Balance Overall balance assessment: Needs assistance Sitting-balance support: Feet supported, Single extremity supported Sitting balance-Leahy Scale: Fair     Standing balance support: Bilateral upper extremity supported, During functional activity, Reliant  on assistive device for balance Standing balance-Leahy Scale: Poor                              Cognition Arousal/Alertness: Awake/alert Behavior During Therapy: WFL for tasks  assessed/performed, Flat affect Overall Cognitive Status: Within Functional Limits for tasks assessed                                          Exercises Total Joint Exercises Ankle Circles/Pumps: AROM, Strengthening, Both, 10 reps Heel Slides: AAROM, Strengthening, Both, 5 reps Hip ABduction/ADduction: AAROM, Strengthening, Both, 5 reps Straight Leg Raises: AAROM, Strengthening, Both, 5 reps Long Arc Quad: Strengthening, Both, 10 reps Knee Flexion: Strengthening, Both, 10 reps Other Exercises Other Exercises: Log roll training    General Comments        Pertinent Vitals/Pain Pain Assessment Pain Assessment: 0-10 Pain Score: 8  Pain Location: LLE Pain Descriptors / Indicators: Guarding, Sore, Constant Pain Intervention(s): Repositioned, Premedicated before session, Monitored during session    Home Living                          Prior Function            PT Goals (current goals can now be found in the care plan section) Progress towards PT goals: Progressing toward goals    Frequency    Min 2X/week      PT Plan Current plan remains appropriate    Co-evaluation              AM-PAC PT "6 Clicks" Mobility   Outcome Measure  Help needed turning from your back to your side while in a flat bed without using bedrails?: A Lot Help needed moving from lying on your back to sitting on the side of a flat bed without using bedrails?: A Lot Help needed moving to and from a bed to a chair (including a wheelchair)?: A Lot Help needed standing up from a chair using your arms (e.g., wheelchair or bedside chair)?: A Lot Help needed to walk in hospital room?: Total Help needed climbing 3-5 steps with a railing? : Total 6 Click Score: 10    End of Session Equipment Utilized During Treatment: Gait belt;Other (comment) (Placed high under arms to avoid drain) Activity Tolerance: Patient tolerated treatment well Patient left: in bed;with call  bell/phone within reach;with bed alarm set Nurse Communication: Mobility status PT Visit Diagnosis: Muscle weakness (generalized) (M62.81);Unsteadiness on feet (R26.81);Difficulty in walking, not elsewhere classified (R26.2)     Time: [id number] PT Time Calculation (min) (ACUTE ONLY): 26 min  Charges:  $Therapeutic Exercise: 8-22 mins $Therapeutic Activity: 8-22 mins                     D. Royetta Asal PT, DPT 01/14/23, 4:47 PM

## 2023-01-14 NOTE — Assessment & Plan Note (Addendum)
Psychogenic nonepileptic events.  Supportive care.  Patient declines psychiatry consultation.

## 2023-01-14 NOTE — Progress Notes (Signed)
Occupational Therapy Treatment Patient Details Name: Tracey Bryant MRN: [id number] DOB: 19-Mar-1951 Today's Date: 01/14/2023   History of present illness 72 y/o female presented to ED on 01/11/23 for recurrent syncope. Admitted for possible UTI. PMH: arthritis, CHF, DM II, DVT, HTN, afib, and stroke. hx of cholecystitis now with chronic biliary drain   OT comments  Tracey Bryant was seen for OT treatment on this date. Upon arrival to room pt awake, semi-supine in bed, she remains mildly lethargic t/o session and c/o of significant (10/10) pain in her L heel/foot. RN notified. Pt declines EOB/OOB activity but agreeable to bed mobility and requests assistance with clean up as she is "wet" after purewick failure. Pt requires MOD A to roll R<>L in bed for peri-hygiene and linen change. MAX A for peri-care. Pt educated on safety, compensatory strategies, and falls prevention t/o session. She return verbalized understanding of education provided and continues to benefit from skilled OT services to maximize return to PLOF and minimize risk of future falls, injury, caregiver burden, and readmission. Will continue to follow POC. Discharge recommendation remains appropriate.     Recommendations for follow up therapy are one component of a multi-disciplinary discharge planning process, led by the attending physician.  Recommendations may be updated based on patient status, additional functional criteria and insurance authorization.    Follow Up Recommendations  Skilled nursing-short term rehab (<3 hours/day)     Assistance Recommended at Discharge Frequent or constant Supervision/Assistance  Patient can return home with the following  A lot of help with bathing/dressing/bathroom;A lot of help with walking and/or transfers;Assistance with cooking/housework;Assist for transportation;Help with stairs or ramp for entrance   Equipment Recommendations   (defer to next venue of care)    Recommendations for  Other Services      Precautions / Restrictions Precautions Precautions: Fall Restrictions Weight Bearing Restrictions: No       Mobility Bed Mobility Overal bed mobility: Needs Assistance Bed Mobility: Rolling Rolling: Mod assist         General bed mobility comments: MOD A for rolling R<>L for peri-care. MAX effort and increased time to perform.    Transfers                   General transfer comment: deferred     Balance Overall balance assessment: Needs assistance     Sitting balance - Comments: Not tested. Pt c/o of significant pain in LLE and fatigue with dizziness during bed mobility.                                   ADL either performed or assessed with clinical judgement   ADL Overall ADL's : Needs assistance/impaired                             Toileting- Clothing Manipulation and Hygiene: Bed level;Maximal assistance Toileting - Clothing Manipulation Details (indicate cue type and reason): Bed level for peri-care after pt noted to have soiled bedding from purewick failure. Significant increased time/effort to perform rolling and peri-hygiene this date.            Extremity/Trunk Assessment Upper Extremity Assessment Upper Extremity Assessment: Generalized weakness   Lower Extremity Assessment Lower Extremity Assessment: Generalized weakness        Vision Patient Visual Report: No change from baseline     Perception  Praxis      Cognition Arousal/Alertness: Awake/alert, Lethargic Behavior During Therapy: WFL for tasks assessed/performed, Flat affect Overall Cognitive Status: No family/caregiver present to determine baseline cognitive functioning                                 General Comments: Follows VCs consistently during session, generally pain limited and lethargic t/o session.        Exercises Other Exercises Other Exercises: Pt educated on role of OT in acute setting,  importance of functional activity during hospital stay, and complementary alternative methods for pain management. OT facilitated bed level toileting/peri-care as described in ADL section.    Shoulder Instructions       General Comments      Pertinent Vitals/ Pain       Pain Assessment Pain Score: 10-Worst pain ever Pain Location: LLE Pain Descriptors / Indicators: Grimacing, Guarding, Sore, Constant Pain Intervention(s): Limited activity within patient's tolerance, Repositioned, Premedicated before session, Patient requesting pain meds-RN notified  Home Living                                          Prior Functioning/Environment              Frequency  Min 2X/week        Progress Toward Goals  OT Goals(current goals can now be found in the care plan section)  Progress towards OT goals: Progressing toward goals  Acute Rehab OT Goals Patient Stated Goal: To have less pain OT Goal Formulation: With patient Time For Goal Achievement: 01/26/23 Potential to Achieve Goals: Good  Plan Discharge plan remains appropriate;Frequency remains appropriate    Co-evaluation                 AM-PAC OT "6 Clicks" Daily Activity     Outcome Measure   Help from another person eating meals?: A Little Help from another person taking care of personal grooming?: A Little Help from another person toileting, which includes using toliet, bedpan, or urinal?: A Lot Help from another person bathing (including washing, rinsing, drying)?: A Lot Help from another person to put on and taking off regular upper body clothing?: A Little Help from another person to put on and taking off regular lower body clothing?: A Lot 6 Click Score: 15    End of Session    OT Visit Diagnosis: Dizziness and giddiness (R42);Other abnormalities of gait and mobility (R26.89);Muscle weakness (generalized) (M62.81)   Activity Tolerance Patient limited by fatigue;Patient limited by  pain   Patient Left in bed;with call bell/phone within reach;with bed alarm set   Nurse Communication Patient requests pain meds        Time: 5852-7782 OT Time Calculation (min): 23 min  Charges: OT General Charges $OT Visit: 1 Visit OT Treatments $Self Care/Home Management : 23-37 mins  Shara Blazing, M.S., OTR/L 01/14/23, 2:55 PM

## 2023-01-14 NOTE — Assessment & Plan Note (Addendum)
A.m. cortisol very low at 1.7.  Continue Cortef 20 mg in the morning and 10 mg in the afternoon.  Patient doing better since starting Cortef.

## 2023-01-14 NOTE — Progress Notes (Signed)
Progress Note   Patient: Tracey Bryant PPJ:[id number] DOB: [birth year] DOA: 01/11/2023     3 DOS: the patient was seen and examined on 01/14/2023   Brief hospital course: 72 y.o. female with medical history significant of  CHF pef , DMII, DVT, HTN , Pafib on Eliquis, CKDIIIb +PPM,h/o CVA x 3, hx of cholecystitis now with chronic biliary drain,history of recurrent syncope followed by neuro who presents to ED in referral from rheumatology clinic s/p recurrent episode of syncope. Patient states she had these episode for over a year and noted that these episodes are not frequent and occur every 4-6 months. She state she does get an aura prior to even and that during even she states she is partially aware but too weak to speak. She states that after even she feels severely fatigued for 5-10 mins and thereafter back to her baseline.  With episodes no she noted on chest pain , n/v/d/ dysuria fever/chills. Or shortness of breath. Of note patient has similar admission 7/18 and at that time evaluation was unrevealing and it was thought that these episodes were stress induced.   1/17.  When turning head to the left patient developed room spinning and dizziness.  Added standing dose meclizine and as needed Valium for vertigo.  When sitting up with physical therapy today, she developed dizziness decreased level of alertness and slurred speech.  Symptoms resolved after few minutes. 1/18.  When working with physical therapy and they sat her up she had shaking episode and became less responsive and some slurred speech.  We checked her blood pressure when this happened and it did not drop.  Patient had similar episode when I went back into the room to see her.  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 1/19.  A.m. cortisol very low at 1.7.  No recent fills of steroids.  Will start Cortef for adrenal insufficiency.  Add on ACTH.  Assessment  and Plan: * Seizure-like activity ([place]) Psychogenic nonepileptic events.  Supportive care.  Patient declines psychiatry consultation.  Adrenal insufficiency (HCC) A.m. cortisol very low at 1.7.  In speaking with the pharmacist the patient has not had any steroids recently.  Will start Cortef 20 mg in the morning and 10 mg in the afternoon.  Wondering if this is the issue with her episodes that she is having.  Acute cystitis with hematuria Empirically on Rocephin.  E. coli growing out of culture.  Cholecystitis, chronic Patient has gallbladder drain in.  CKD (chronic kidney disease), stage IV (HCC) Creatinine 2.27 with a GFR of 23  Iron deficiency anemia Continue to monitor hemoglobin.  Last hemoglobin 9.7.  Hypokalemia Replaced  History of DVT (deep vein thrombosis) On Eliquis.  Repeat sonogram of the lower extremity does not show any DVT.  Patient does have an elevated D-dimer but taking Eliquis.  Hypothyroidism, unspecified Continue levothyroxine  Rheumatoid arthritis ([place]) Continue Plaquenil  History of CVA (cerebrovascular accident) CT scan does not show any acute infarcts.  Chronic systolic CHF (congestive heart failure) (HCC) Continue Lasix, losartan and Coreg, last EF 40%.  Essential hypertension Blood pressure trending higher.  Restarted Coreg and Imdur this morning.  Will add Norvasc now and losartan this evening.  Uncontrolled type 2 diabetes mellitus with hypoglycemia, without long-term current use of insulin (HCC) Sliding scale insulin for now holding oral hypoglycemics.        Subjective: Patient feeling a little bit better today.  Did not have any of the episodes prior to me seeing her.  Physical Exam: Vitals:   01/14/23 0416 01/14/23 0522 01/14/23 0822 01/14/23 1552  BP: 134/66  (!) 142/76 (!) 161/92  Pulse: 76  76 75  Resp: '18  18 18  '$ Temp: 97.9 F (36.6 C)  97.9 F (36.6 C) 98.4 F (36.9 C)  TempSrc:   Oral Oral  SpO2: 97%  98% 100%   Weight:  85.3 kg    Height:       Physical Exam HENT:     Head: Normocephalic.     Mouth/Throat:     Pharynx: No oropharyngeal exudate.  Eyes:     General: Lids are normal.     Conjunctiva/sclera: Conjunctivae normal.  Cardiovascular:     Rate and Rhythm: Normal rate and regular rhythm.     Heart sounds: Normal heart sounds, S1 normal and S2 normal.  Pulmonary:     Breath sounds: No decreased breath sounds, wheezing, rhonchi or rales.  Abdominal:     Palpations: Abdomen is soft.     Tenderness: There is no abdominal tenderness.  Musculoskeletal:     Right lower leg: No swelling.     Left lower leg: No swelling.  Skin:    General: Skin is warm.     Findings: No rash.     Comments: Left heel ulceration with black eschar.  Neurological:     Mental Status: She is alert and oriented to person, place, and time.     Comments: Answers questions appropriately.  Able to turn to the right without any symptoms today.     Data Reviewed: A.m. cortisol 1.7.  No recent fills of any steroids.  Family Communication: Still unable to reach family.  Disposition: Status is: Inpatient Remains inpatient appropriate because: Still very weak  Planned Discharge Destination: Home with home health versus rehab    Time spent: 28 minutes  Author: Loletha Grayer, MD 01/14/2023 4:14 PM  For on call review [email or web address].

## 2023-01-15 DIAGNOSIS — K811 Chronic cholecystitis: Secondary | ICD-10-CM | POA: Diagnosis not present

## 2023-01-15 DIAGNOSIS — L89620 Pressure ulcer of left heel, unstageable: Secondary | ICD-10-CM | POA: Diagnosis present

## 2023-01-15 DIAGNOSIS — E2740 Unspecified adrenocortical insufficiency: Secondary | ICD-10-CM | POA: Diagnosis not present

## 2023-01-15 DIAGNOSIS — R569 Unspecified convulsions: Secondary | ICD-10-CM | POA: Diagnosis not present

## 2023-01-15 DIAGNOSIS — N3001 Acute cystitis with hematuria: Secondary | ICD-10-CM | POA: Diagnosis not present

## 2023-01-15 LAB — CBC
HCT: 27.5 % — ABNORMAL LOW (ref 36.0–46.0)
Hemoglobin: 8.6 g/dL — ABNORMAL LOW (ref 12.0–15.0)
MCH: 30 pg (ref 26.0–34.0)
MCHC: 31.3 g/dL (ref 30.0–36.0)
MCV: 95.8 fL (ref 80.0–100.0)
Platelets: 241 10*3/uL (ref 150–400)
RBC: 2.87 MIL/uL — ABNORMAL LOW (ref 3.87–5.11)
RDW: 15.3 % (ref 11.5–15.5)
WBC: 5.9 10*3/uL (ref 4.0–10.5)
nRBC: 0 % (ref 0.0–0.2)

## 2023-01-15 LAB — GLUCOSE, CAPILLARY
Glucose-Capillary: 92 mg/dL (ref 70–99)
Glucose-Capillary: 87 mg/dL (ref 70–99)
Glucose-Capillary: 125 mg/dL — ABNORMAL HIGH (ref 70–99)
Glucose-Capillary: 153 mg/dL — ABNORMAL HIGH (ref 70–99)
Glucose-Capillary: 218 mg/dL — ABNORMAL HIGH (ref 70–99)
Glucose-Capillary: 255 mg/dL — ABNORMAL HIGH (ref 70–99)

## 2023-01-15 LAB — BASIC METABOLIC PANEL
Anion gap: 9 (ref 5–15)
BUN: 48 mg/dL — ABNORMAL HIGH (ref 8–23)
CO2: 20 mmol/L — ABNORMAL LOW (ref 22–32)
Calcium: 7.8 mg/dL — ABNORMAL LOW (ref 8.9–10.3)
Chloride: 107 mmol/L (ref 98–111)
Creatinine, Ser: 2.57 mg/dL — ABNORMAL HIGH (ref 0.44–1.00)
GFR, Estimated: 19 mL/min — ABNORMAL LOW (ref >60)
Glucose, Bld: 116 mg/dL — ABNORMAL HIGH (ref 70–99)
Potassium: 4.3 mmol/L (ref 3.5–5.1)
Sodium: 136 mmol/L (ref 135–145)

## 2023-01-15 MED ORDER — CEPHALEXIN 500 MG PO CAPS
500.0000 mg | ORAL_CAPSULE | Freq: Two times a day (BID) | ORAL | Status: DC
  Administered 2023-01-15 – 2023-01-18 (×6): 500 mg via ORAL
  Filled 2023-01-15 (×6): qty 1

## 2023-01-15 NOTE — Progress Notes (Signed)
Progress Note   Patient: Tracey Bryant GEZ:[id number] DOB: 21-Sep-1951 DOA: 01/11/2023     4 DOS: the patient was seen and examined on 01/15/2023   Brief hospital course: 72 y.o. female with medical history significant of  CHF pef , DMII, DVT, HTN , Pafib on Eliquis, CKDIIIb +PPM,h/o CVA x 3, hx of cholecystitis now with chronic biliary drain,history of recurrent syncope followed by neuro who presents to ED in referral from rheumatology clinic s/p recurrent episode of syncope. Patient states she had these episode for over a year and noted that these episodes are not frequent and occur every 4-6 months. She state she does get an aura prior to even and that during even she states she is partially aware but too weak to speak. She states that after even she feels severely fatigued for 5-10 mins and thereafter back to her baseline.  With episodes no she noted on chest pain , n/v/d/ dysuria fever/chills. Or shortness of breath. Of note patient has similar admission 7/18 and at that time evaluation was unrevealing and it was thought that these episodes were stress induced.   1/17.  When turning head to the left patient developed room spinning and dizziness.  Added standing dose meclizine and as needed Valium for vertigo.  When sitting up with physical therapy today, she developed dizziness decreased level of alertness and slurred speech.  Symptoms resolved after few minutes. 1/18.  When working with physical therapy and they sat her up she had shaking episode and became less responsive and some slurred speech.  We checked her blood pressure when this happened and it did not drop.  Patient had similar episode when I went back into the room to see her.  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 1/19.  A.m. cortisol very low at 1.7.  No recent fills of steroids.  Will start Cortef for adrenal insufficiency.  Add on ACTH.  Assessment  and Plan: * Seizure-like activity ([place]) Psychogenic nonepileptic events.  Supportive care.  Patient declines psychiatry consultation.  Adrenal insufficiency (HCC) A.m. cortisol very low at 1.7.  In speaking with the pharmacist the patient has not had any steroids recently.  Will start Cortef 20 mg in the morning and 10 mg in the afternoon.  Wondering if this is the issue with her episodes that she is having.  Acute cystitis with hematuria Empirically on Rocephin.  E. coli growing out of culture.  Cholecystitis, chronic Patient has gallbladder drain in.  CKD (chronic kidney disease), stage IV (HCC) Creatinine 2.27 with a GFR of 23  Iron deficiency anemia Continue to monitor hemoglobin.  Last hemoglobin 9.7.  Hypokalemia Replaced  History of DVT (deep vein thrombosis) On Eliquis.  Repeat sonogram of the lower extremity does not show any DVT.  Patient does have an elevated D-dimer but taking Eliquis.  Hypothyroidism, unspecified Continue levothyroxine  Rheumatoid arthritis ([place]) Continue Plaquenil  History of CVA (cerebrovascular accident) CT scan does not show any acute infarcts.  Chronic systolic CHF (congestive heart failure) (HCC) Continue Lasix, losartan and Coreg, last EF 40%.  Essential hypertension Blood pressure trending higher.  Restarted Coreg and Imdur this morning.  Will add Norvasc now and losartan this evening.  Uncontrolled type 2 diabetes mellitus with hypoglycemia, without long-term current use of insulin (HCC) Sliding scale insulin for now holding oral hypoglycemics.        Subjective: Patient feeling a little bit better this morning.  Started on Cortef yesterday afternoon.  Physical Exam: Vitals:   01/14/23 1552 01/14/23 2049 01/15/23 0418 01/15/23 0815  BP: (!) 161/92 139/84 (!) 146/77 134/72  Pulse: 75 77 75 77  Resp: '18 20 20 20  '$ Temp: 98.4 F (36.9 C) 98.9 F (37.2 C) 98.5 F (36.9 C) 97.7 F (36.5 C)  TempSrc: Oral Oral Oral Oral   SpO2: 100% 97% 98% 97%  Weight:      Height:       Physical Exam HENT:     Head: Normocephalic.     Mouth/Throat:     Pharynx: No oropharyngeal exudate.  Eyes:     General: Lids are normal.     Conjunctiva/sclera: Conjunctivae normal.  Cardiovascular:     Rate and Rhythm: Normal rate and regular rhythm.     Heart sounds: Normal heart sounds, S1 normal and S2 normal.  Pulmonary:     Breath sounds: No decreased breath sounds, wheezing, rhonchi or rales.  Abdominal:     Palpations: Abdomen is soft.     Tenderness: There is no abdominal tenderness.  Musculoskeletal:     Right lower leg: No swelling.     Left lower leg: No swelling.  Skin:    General: Skin is warm.     Findings: No rash.     Comments: Left heel ulceration with black eschar.  Neurological:     Mental Status: She is alert and oriented to person, place, and time.     Comments: Answers questions appropriately.  Able to turn to the right without any symptoms today.     Data Reviewed: A.m. cortisol yesterday 1.7, hemoglobin 8.6, creatinine 2.57 with a GFR of 19.  Family Communication: Spoke with patient's sister on the phone Disposition: Status is: Inpatient Remains inpatient appropriate because: Continue working with physical therapy.  Started Cortef yesterday secondary to adrenal insufficiency.  Planned Discharge Destination: Home with Home Health    Time spent: 28 minutes  Author: Loletha Grayer, MD 01/15/2023 1:29 PM  For on call review [email or web address].

## 2023-01-15 NOTE — Assessment & Plan Note (Addendum)
Appreciate wound care consultation.  Heel protectors while in bed.  Present on admission.  See full description below.  X-ray did not show any signs of osteomyelitis.

## 2023-01-15 NOTE — TOC Progression Note (Signed)
Transition of Care [facility]) - Progression Note    Patient Details  Name: Tracey Bryant MRN: [id number] Date of Birth: Jun 02, 1951  Transition of [facility]) CM/SW [place], LCSW Phone Number: 01/15/2023, 1:41 PM  Clinical Narrative:    Notified by MD that patient and sister are agreeable to Outpatient Palliative Care. Referral made to [facility] to screen for palliative care.   Expected Discharge Plan: [place] Barriers to Discharge: Continued Medical Work up  Expected Discharge Plan and Services     Post Acute Care Choice: Resumption of Svcs/PTA Provider Living arrangements for the past 2 months: Single Family Home                                       Social Determinants of Health (SDOH) Interventions SDOH Screenings   Food Insecurity: No Food Insecurity (01/12/2023)  Housing: Low Risk  (01/12/2023)  Transportation Needs: No Transportation Needs (01/12/2023)  Utilities: Not At Risk (01/12/2023)  Tobacco Use: Low Risk  (01/12/2023)    Readmission Risk Interventions    01/13/2023   11:57 AM 12/27/2021    2:32 PM 03/19/2021    9:34 AM  Readmission Risk Prevention Plan  Transportation Screening Complete Complete   PCP or Specialist Appt within 5-7 Days   Complete  PCP or Specialist Appt within 3-5 Days  Complete   Home Care Screening   Complete  [place] or [place]  Complete   Social Work Consult for [place] Planning/Counseling  Complete   Palliative Care Screening  Not Applicable   Medication Review Press photographer) Complete Complete   PCP or Specialist appointment within 3-5 days of discharge Complete    HRI or [place] Complete    SW Recovery Care/Counseling Consult Complete    Palliative Care Screening Not [place] Patient Refused

## 2023-01-15 NOTE — Progress Notes (Signed)
Physical Therapy Treatment Patient Details Name: Tracey Bryant MRN: [id number] DOB: 02-19-1951 Today's Date: 01/15/2023   History of Present Illness Pt is a 72 y/o female presented to ED on 01/11/23 for recurrent syncope. Admitted for possible UTI. PMH: arthritis, CHF, DM II, DVT, HTN, afib, and stroke. hx of cholecystitis now with chronic biliary drain    PT Comments    Pt was pleasant and motivated to participate during the session and put forth good effort throughout. Pt made good progress towards goals this session including requiring grossly less assistance with all functional tasks.  Pt required only SBA with sup to sit this session and once sitting at the EOB presented with very little tremulousness that resolved quickly and with no noted speech degradation.  Pt was able to stand with only minimal assist and ambulate from bed to chair with no adverse symptoms.  Pt will benefit from PT services in a SNF setting upon discharge to safely address deficits listed in patient problem list for decreased caregiver assistance and eventual return to PLOF.     Recommendations for follow up therapy are one component of a multi-disciplinary discharge planning process, led by the attending physician.  Recommendations may be updated based on patient status, additional functional criteria and insurance authorization.  Follow Up Recommendations  Skilled nursing-short term rehab (<3 hours/day) Can patient physically be transported by private vehicle: No   Assistance Recommended at Discharge Frequent or constant Supervision/Assistance  Patient can return home with the following A lot of help with walking and/or transfers;A lot of help with bathing/dressing/bathroom;Assistance with cooking/housework;Assist for transportation;Help with stairs or ramp for entrance;Direct supervision/assist for medications management;Direct supervision/assist for financial management   Equipment Recommendations  None  recommended by PT    Recommendations for Other Services       Precautions / Restrictions Precautions Precautions: Fall Precaution Comments: seizure like activity with movement Restrictions Weight Bearing Restrictions: No     Mobility  Bed Mobility Overal bed mobility: Needs Assistance       Supine to sit: Supervision     General bed mobility comments: Extra time and effort and use of bed rail but no physical assistance needed during sup to sit    Transfers Overall transfer level: Needs assistance Equipment used: Rolling walker (2 wheels) Transfers: Sit to/from Stand Sit to Stand: Min assist, From elevated surface           General transfer comment: Mod verbal cues for hand placement and increased trunk flexion    Ambulation/Gait Ambulation/Gait assistance: Min assist Gait Distance (Feet): 4 Feet Assistive device: Rolling walker (2 wheels) Gait Pattern/deviations: Decreased step length - right, Decreased step length - left, Trunk flexed, Shuffle, Step-through pattern Gait velocity: decreased     General Gait Details: Pt able to amb with min A to guide the RW but no overt LOB this session and no adverse symptoms with gait   Stairs             Wheelchair Mobility    Modified Rankin (Stroke Patients Only)       Balance Overall balance assessment: Needs assistance Sitting-balance support: Feet supported, Single extremity supported Sitting balance-Leahy Scale: Good     Standing balance support: Bilateral upper extremity supported, During functional activity, Reliant on assistive device for balance Standing balance-Leahy Scale: Fair                              Cognition Arousal/Alertness:  Awake/alert Behavior During Therapy: WFL for tasks assessed/performed Overall Cognitive Status: Within Functional Limits for tasks assessed                                          Exercises Total Joint Exercises Ankle  Circles/Pumps: AROM, Strengthening, Both, 10 reps, 5 reps Quad Sets: Strengthening, Both, 5 reps, 10 reps Hip ABduction/ADduction: AAROM, Strengthening, Both, 5 reps Straight Leg Raises: AAROM, Strengthening, Both, 5 reps Long Arc Quad: Strengthening, Both, 10 reps, 5 reps Knee Flexion: Strengthening, Both, 10 reps, 5 reps Marching in Standing: Strengthening, Both, 5 reps, Standing    General Comments        Pertinent Vitals/Pain Pain Assessment Pain Assessment: 0-10 Pain Score: 6  Pain Location: LLE Pain Descriptors / Indicators: Sore, Tender Pain Intervention(s): Premedicated before session, Repositioned, Monitored during session    Home Living                          Prior Function            PT Goals (current goals can now be found in the care plan section) Progress towards PT goals: Progressing toward goals    Frequency    Min 2X/week      PT Plan Current plan remains appropriate    Co-evaluation              AM-PAC PT "6 Clicks" Mobility   Outcome Measure  Help needed turning from your back to your side while in a flat bed without using bedrails?: A Little Help needed moving from lying on your back to sitting on the side of a flat bed without using bedrails?: A Little Help needed moving to and from a bed to a chair (including a wheelchair)?: A Little Help needed standing up from a chair using your arms (e.g., wheelchair or bedside chair)?: A Little Help needed to walk in hospital room?: Total Help needed climbing 3-5 steps with a railing? : Total 6 Click Score: 14    End of Session Equipment Utilized During Treatment: Gait belt;Other (comment) (Place high under the arms to avoid drain) Activity Tolerance: Patient tolerated treatment well Patient left: in chair;with call bell/phone within reach;with chair alarm set Nurse Communication: Mobility status PT Visit Diagnosis: Muscle weakness (generalized) (M62.81);Unsteadiness on feet  (R26.81);Difficulty in walking, not elsewhere classified (R26.2)     Time: 1610-9604 PT Time Calculation (min) (ACUTE ONLY): 25 min  Charges:  $Therapeutic Exercise: 8-22 mins $Therapeutic Activity: 8-22 mins                     D. Scott Yanai Hobson PT, DPT 01/15/23, 10:24 AM

## 2023-01-16 ENCOUNTER — Inpatient Hospital Stay: Payer: Medicare HMO

## 2023-01-16 DIAGNOSIS — D500 Iron deficiency anemia secondary to blood loss (chronic): Secondary | ICD-10-CM

## 2023-01-16 DIAGNOSIS — N189 Chronic kidney disease, unspecified: Secondary | ICD-10-CM

## 2023-01-16 DIAGNOSIS — N3001 Acute cystitis with hematuria: Secondary | ICD-10-CM | POA: Diagnosis not present

## 2023-01-16 DIAGNOSIS — E2740 Unspecified adrenocortical insufficiency: Secondary | ICD-10-CM | POA: Diagnosis not present

## 2023-01-16 DIAGNOSIS — N179 Acute kidney failure, unspecified: Secondary | ICD-10-CM | POA: Diagnosis not present

## 2023-01-16 DIAGNOSIS — R569 Unspecified convulsions: Secondary | ICD-10-CM | POA: Diagnosis not present

## 2023-01-16 LAB — GLUCOSE, CAPILLARY
Glucose-Capillary: 87 mg/dL (ref 70–99)
Glucose-Capillary: 138 mg/dL — ABNORMAL HIGH (ref 70–99)
Glucose-Capillary: 193 mg/dL — ABNORMAL HIGH (ref 70–99)
Glucose-Capillary: 247 mg/dL — ABNORMAL HIGH (ref 70–99)

## 2023-01-16 LAB — BASIC METABOLIC PANEL
Anion gap: 7 (ref 5–15)
BUN: 50 mg/dL — ABNORMAL HIGH (ref 8–23)
CO2: 21 mmol/L — ABNORMAL LOW (ref 22–32)
Calcium: 7.8 mg/dL — ABNORMAL LOW (ref 8.9–10.3)
Chloride: 108 mmol/L (ref 98–111)
Creatinine, Ser: 2.64 mg/dL — ABNORMAL HIGH (ref 0.44–1.00)
GFR, Estimated: 19 mL/min — ABNORMAL LOW (ref >60)
Glucose, Bld: 97 mg/dL (ref 70–99)
Potassium: 4.2 mmol/L (ref 3.5–5.1)
Sodium: 136 mmol/L (ref 135–145)

## 2023-01-16 LAB — HEMOGLOBIN: Hemoglobin: 8.9 g/dL — ABNORMAL LOW (ref 12.0–15.0)

## 2023-01-16 MED ORDER — SODIUM CHLORIDE 0.9 % IV BOLUS
250.0000 mL | Freq: Once | INTRAVENOUS | Status: AC
  Administered 2023-01-16: 250 mL via INTRAVENOUS

## 2023-01-16 NOTE — Assessment & Plan Note (Addendum)
AKI on CKD stage IV. Creatinine went up from 2.23 on admission to 2.68 on 1/22.   Has been on gentle IV hydration with slight improvement Cr 2.60 >> 2.48 today. Fluid bolus given 1/23 and gentle IV fluids since then. Worsening edema and hx CHF. Will stop fluids & consider diuresis if volume status worsening Monitor BMP. Renal U/S with no obstruction. Hold losartan and Lasix.

## 2023-01-16 NOTE — Progress Notes (Signed)
Progress Note   Patient: Tracey Bryant GYK:[id number] DOB: [birth year] DOA: 01/11/2023     5 DOS: the patient was seen and examined on 01/16/2023   Brief hospital course: 72 y.o. female with medical history significant of  CHF pef , DMII, DVT, HTN , Pafib on Eliquis, CKDIIIb +PPM,h/o CVA x 3, hx of cholecystitis now with chronic biliary drain,history of recurrent syncope followed by neuro who presents to ED in referral from rheumatology clinic s/p recurrent episode of syncope. Patient states she had these episode for over a year and noted that these episodes are not frequent and occur every 4-6 months. She state she does get an aura prior to even and that during even she states she is partially aware but too weak to speak. She states that after even she feels severely fatigued for 5-10 mins and thereafter back to her baseline.  With episodes no she noted on chest pain , n/v/d/ dysuria fever/chills. Or shortness of breath. Of note patient has similar admission 7/18 and at that time evaluation was unrevealing and it was thought that these episodes were stress induced.   1/17.  When turning head to the left patient developed room spinning and dizziness.  Added standing dose meclizine and as needed Valium for vertigo.  When sitting up with physical therapy today, she developed dizziness decreased level of alertness and slurred speech.  Symptoms resolved after few minutes. 1/18.  When working with physical therapy and they sat her up she had shaking episode and became less responsive and some slurred speech.  We checked her blood pressure when this happened and it did not drop.  Patient had similar episode when I went back into the room to see her.  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 1/19.  A.m. cortisol very low at 1.7.  No recent fills of steroids.  Will start Cortef for adrenal insufficiency.  Add on ACTH. 1/20.  Patient  walked 4 feet with physical therapy today. 1/21.  Creatinine continues to creep up to 2.64.  D/C losartan.  Will give a fluid bolus today.  Assessment and Plan: * Acute kidney injury superimposed on chronic kidney disease ([place]) Creatinine went up from 2.33 on admission to 2.64.  Discontinue losartan.  Give a fluid bolus today.  Hold Lasix.  Recheck creatinine tomorrow.  Adrenal insufficiency (HCC) A.m. cortisol very low at 1.7.  Continue Cortef 20 mg in the morning and 10 mg in the afternoon.  Acute cystitis with hematuria Switched Rocephin over to Keflex.  E. coli growing out of culture.  Seizure-like activity ([place]) Psychogenic nonepileptic events.  Supportive care.  Patient declines psychiatry consultation.  Cholecystitis, chronic Patient has gallbladder drain in.  Decubitus ulcer of left heel, unstageable ([place]) Appreciate wound care consultation.  Heel protectors while in bed.  Present on admission.  See full description below.  Iron deficiency anemia Continue to monitor hemoglobin.  Last hemoglobin 8.9  Hypokalemia Replaced  History of DVT (deep vein thrombosis) On Eliquis.  Repeat sonogram of the lower extremity does not show any DVT.  Patient does have an elevated D-dimer but taking Eliquis.  Hypothyroidism, unspecified Continue levothyroxine  Rheumatoid arthritis ([place]) Continue Plaquenil  History of CVA (cerebrovascular accident) CT scan does not show any acute infarcts.  Chronic systolic CHF (congestive heart failure) (HCC) Continue Coreg, last EF 40%.  Hold Lasix and losartan.  Essential hypertension Continue Coreg, Imdur and Norvasc.  Discontinue losartan.  Hold Lasix.  Uncontrolled type 2 diabetes mellitus with hypoglycemia, without long-term current use of insulin (HCC) Sliding scale insulin for now holding oral hypoglycemics.        Subjective: Patient feeling a little bit better.  Still very weak.  Did not get dizzy or have one of the  spells.  Physical Exam: Vitals:   01/15/23 2105 01/16/23 0300 01/16/23 0457 01/16/23 0824  BP: (!) 151/81  138/82 (!) 146/77  Pulse: 74  77 77  Resp: '20  17 18  '$ Temp: 98.1 F (36.7 C)  98.4 F (36.9 C) 97.6 F (36.4 C)  TempSrc:    Oral  SpO2: 96%  98% 98%  Weight:  86.2 kg    Height:       Physical Exam HENT:     Head: Normocephalic.     Mouth/Throat:     Pharynx: No oropharyngeal exudate.  Eyes:     General: Lids are normal.     Conjunctiva/sclera: Conjunctivae normal.  Cardiovascular:     Rate and Rhythm: Normal rate and regular rhythm.     Heart sounds: Normal heart sounds, S1 normal and S2 normal.  Pulmonary:     Breath sounds: No decreased breath sounds, wheezing, rhonchi or rales.  Abdominal:     Palpations: Abdomen is soft.     Tenderness: There is no abdominal tenderness.  Musculoskeletal:     Right lower leg: No swelling.     Left lower leg: No swelling.  Skin:    General: Skin is warm.     Findings: No rash.     Comments: Left heel ulceration with black eschar.  Neurological:     Mental Status: She is alert and oriented to person, place, and time.     Data Reviewed: Creatinine 2.64 with BUN of 50, hemoglobin 8.9  Family Communication: Spoke with patient's sister on the phone  Disposition: Status is: Inpatient Remains inpatient appropriate because: Would like to see the patient to move around a little bit more before going home  Planned Discharge Destination: Home with Home Health    Time spent: 28 minutes  Author: Loletha Grayer, MD 01/16/2023 3:24 PM  For on call review [email or web address].

## 2023-01-16 NOTE — Progress Notes (Addendum)
[facility] Hospital Liaison Note  Notified by Rockne Coons of patient/family request for [facility] Palliative services at home after discharge.   [place] liaison will screen for appropriateness and follow patient for discharge disposition.   Please call with any Hospice/Palliative related questions or concerns.   Thank you for the opportunity to participate in this patient's care  Jhonnie Garner RN, BSN, [facility] [phone]

## 2023-01-17 DIAGNOSIS — R040 Epistaxis: Secondary | ICD-10-CM | POA: Diagnosis not present

## 2023-01-17 DIAGNOSIS — I480 Paroxysmal atrial fibrillation: Secondary | ICD-10-CM

## 2023-01-17 DIAGNOSIS — E2740 Unspecified adrenocortical insufficiency: Secondary | ICD-10-CM | POA: Diagnosis not present

## 2023-01-17 DIAGNOSIS — N3001 Acute cystitis with hematuria: Secondary | ICD-10-CM | POA: Diagnosis not present

## 2023-01-17 DIAGNOSIS — N179 Acute kidney failure, unspecified: Secondary | ICD-10-CM | POA: Diagnosis not present

## 2023-01-17 LAB — GLUCOSE, CAPILLARY
Glucose-Capillary: 174 mg/dL — ABNORMAL HIGH (ref 70–99)
Glucose-Capillary: 214 mg/dL — ABNORMAL HIGH (ref 70–99)
Glucose-Capillary: 159 mg/dL — ABNORMAL HIGH (ref 70–99)
Glucose-Capillary: 220 mg/dL — ABNORMAL HIGH (ref 70–99)

## 2023-01-17 LAB — BASIC METABOLIC PANEL
Anion gap: 10 (ref 5–15)
BUN: 58 mg/dL — ABNORMAL HIGH (ref 8–23)
CO2: 21 mmol/L — ABNORMAL LOW (ref 22–32)
Calcium: 7.7 mg/dL — ABNORMAL LOW (ref 8.9–10.3)
Chloride: 106 mmol/L (ref 98–111)
Creatinine, Ser: 2.68 mg/dL — ABNORMAL HIGH (ref 0.44–1.00)
GFR, Estimated: 18 mL/min — ABNORMAL LOW (ref >60)
Glucose, Bld: 160 mg/dL — ABNORMAL HIGH (ref 70–99)
Potassium: 4.4 mmol/L (ref 3.5–5.1)
Sodium: 137 mmol/L (ref 135–145)

## 2023-01-17 LAB — HEMOGLOBIN: Hemoglobin: 8.2 g/dL — ABNORMAL LOW (ref 12.0–15.0)

## 2023-01-17 MED ORDER — OXYMETAZOLINE HCL 0.05 % NA SOLN
4.0000 | Freq: Two times a day (BID) | NASAL | Status: AC | PRN
  Administered 2023-01-17: 4 via NASAL
  Filled 2023-01-17: qty 15

## 2023-01-17 MED ORDER — SODIUM CHLORIDE 0.9 % IV SOLN
300.0000 mg | Freq: Once | INTRAVENOUS | Status: AC
  Administered 2023-01-17: 300 mg via INTRAVENOUS
  Filled 2023-01-17: qty 300

## 2023-01-17 NOTE — Progress Notes (Signed)
Pt having frequent nose bleeds episode. Notified provider. Huge blood clot extracted from left nostril

## 2023-01-17 NOTE — Care Management Important Message (Signed)
Important Message  Patient Details  Name: Tracey Bryant MRN: [id number] Date of Birth: January 03, 1951   Medicare Important Message Given:  Yes     Dannette Barbara 01/17/2023, 11:21 AM

## 2023-01-17 NOTE — Assessment & Plan Note (Addendum)
Eliquis was held (was on 5 mg BID) due to nose bleeds and Hbg trended down.   IV Venofer given on 1/22.  Hbg improved and stabilized. Pt agreeable to resume low dose Eliquis while here. This was started 1/25 PM. She had recurrent epistaxis and worsened anemia again on 1/26. Stop Eliquis. No further episodes.

## 2023-01-17 NOTE — Progress Notes (Signed)
Occupational Therapy Treatment Patient Details Name: Tracey Bryant MRN: [id number] DOB: Jun 02, 1951 Today's Date: 01/17/2023   History of present illness Pt is a 72 y/o female presented to ED on 01/11/23 for recurrent syncope. MD assessement includes: Acute kidney injury superimposed on chronic kidney disease, adrenal insufficiency,  psychogenic nonepileptic events, decubitus ulcer of left heel, unstageable, iron deficiency anemia, and hypokalemia.  PMH includes arthritis, CHF, DM II, DVT, HTN, afib, and stroke. hx of cholecystitis now with chronic biliary drain.   OT comments  Pt received seated in recliner. Appearing alert and pleasant; willing to work with OT on ADLs in seated position, as pt stated that she has just transferred to chair with PT. See flowsheet below for further details of session. Left with BIL LE elevated in recliner, chair alarm on, with all needs in reach.  Patient will benefit from continued OT while in acute care.     Recommendations for follow up therapy are one component of a multi-disciplinary discharge planning process, led by the attending physician.  Recommendations may be updated based on patient status, additional functional criteria and insurance authorization.    Follow Up Recommendations  Skilled nursing-short term rehab (<3 hours/day)     Assistance Recommended at Discharge Frequent or constant Supervision/Assistance  Patient can return home with the following  A lot of help with bathing/dressing/bathroom;A lot of help with walking and/or transfers;Assistance with cooking/housework;Assist for transportation;Help with stairs or ramp for entrance   Equipment Recommendations  Other (comment) (defer to next venue of care)    Recommendations for Other Services      Precautions / Restrictions Precautions Precautions: Fall Precaution Comments: seizure like activity with movement, resolving Restrictions Weight Bearing Restrictions: No       Mobility  Bed Mobility               General bed mobility comments: Pt already in recliner when OT arrived.    Transfers                         Balance                                           ADL either performed or assessed with clinical judgement   ADL Overall ADL's : Needs assistance/impaired     Grooming: Oral care;Wash/dry face;Wash/dry hands;Set up;Sitting (in recliner)                                 General ADL Comments: pt declining any mobility this OT session stating she just got to chair with PT and is fatigued. Pt with gauze in R nostril 2/2 nosebleed (guaze is bloody; OT left it in place during session). Pt able to do grooming ADLs in seated position.    Extremity/Trunk Assessment Upper Extremity Assessment Upper Extremity Assessment: Generalized weakness (Pt with jerking movement that she attributes to OA when extending elbows.)   Lower Extremity Assessment Lower Extremity Assessment: Defer to PT evaluation        Vision       Perception     Praxis      Cognition Arousal/Alertness: Awake/alert Behavior During Therapy: WFL for tasks assessed/performed Overall Cognitive Status: Within Functional Limits for tasks assessed  General Comments: follows cues; agreeable to seated OT session today.        Exercises      Shoulder Instructions       General Comments Pt participated in 10 reps 2 sets of UE exercises AROM reaching forward and shoulder abduction.    Pertinent Vitals/ Pain       Pain Assessment Pain Assessment: No/denies pain  Home Living                                          Prior Functioning/Environment              Frequency  Min 2X/week        Progress Toward Goals  OT Goals(current goals can now be found in the care plan section)  Progress towards OT goals: Progressing toward goals  Acute Rehab OT  Goals Patient Stated Goal: Get better OT Goal Formulation: With patient Time For Goal Achievement: 01/26/23 Potential to Achieve Goals: Good ADL Goals Pt Will Perform Grooming: with modified independence;standing Pt Will Perform Lower Body Dressing: with modified independence;sit to/from stand Pt Will Transfer to Toilet: with modified independence;ambulating Pt Will Perform Toileting - Clothing Manipulation and hygiene: with modified independence;sit to/from stand  Plan Discharge plan remains appropriate;Frequency remains appropriate    Co-evaluation                 AM-PAC OT "6 Clicks" Daily Activity     Outcome Measure   Help from another person eating meals?: A Little Help from another person taking care of personal grooming?: None Help from another person toileting, which includes using toliet, bedpan, or urinal?: A Lot Help from another person bathing (including washing, rinsing, drying)?: A Lot Help from another person to put on and taking off regular upper body clothing?: A Little Help from another person to put on and taking off regular lower body clothing?: A Lot 6 Click Score: 16    End of Session    OT Visit Diagnosis: Dizziness and giddiness (R42);Other abnormalities of gait and mobility (R26.89);Muscle weakness (generalized) (M62.81)   Activity Tolerance Patient limited by fatigue   Patient Left in chair;with chair alarm set   Nurse Communication Mobility status        Time: 1916-6060 OT Time Calculation (min): 13 min  Charges: OT General Charges $OT Visit: 1 Visit OT Treatments $Self Care/Home Management : 8-22 mins  Waymon Amato, MS, OTR/L   Vania Rea 01/17/2023, 3:42 PM

## 2023-01-17 NOTE — Progress Notes (Signed)
Physical Therapy Treatment Patient Details Name: Tracey Bryant MRN: [id number] DOB: 02/10/1951 Today's Date: 01/17/2023   History of Present Illness Pt is a 72 y/o female presented to ED on 01/11/23 for recurrent syncope. MD assessement includes: Acute kidney injury superimposed on chronic kidney disease, adrenal insufficiency,  psychogenic nonepileptic events, decubitus ulcer of left heel, unstageable, iron deficiency anemia, and hypokalemia.  PMH includes arthritis, CHF, DM II, DVT, HTN, afib, and stroke. hx of cholecystitis now with chronic biliary drain.    PT Comments    Pt was pleasant and motivated to participate during the session and put forth good effort throughout. Pt continued to make good progress towards goals this session.  Pt required no physical assistance with bed mobility, transfers, or gait but did continue to require cuing for proper sequencing with functional tasks.  Pt presented with no adverse symptoms when coming to sitting at the EOB this session and SpO2 and HR both were WNL throughout the session.  Pt was able to ambulate a max of 20 feet this session with occasional shuffling and with slow cadence but was generally steady without overt LOB.  Pt is expected to progress soon to being safe for return home with 24/7 supervision but due to continued general weakness and difficulty walking pt will benefit from PT services in a SNF setting upon discharge to safely address deficits listed in patient problem list for decreased caregiver assistance and eventual return to PLOF.     Recommendations for follow up therapy are one component of a multi-disciplinary discharge planning process, led by the attending physician.  Recommendations may be updated based on patient status, additional functional criteria and insurance authorization.  Follow Up Recommendations  Skilled nursing-short term rehab (<3 hours/day) Can patient physically be transported by private vehicle: Yes    Assistance Recommended at Discharge Frequent or constant Supervision/Assistance  Patient can return home with the following A lot of help with walking and/or transfers;A lot of help with bathing/dressing/bathroom;Assistance with cooking/housework;Assist for transportation;Help with stairs or ramp for entrance;Direct supervision/assist for medications management;Direct supervision/assist for financial management   Equipment Recommendations  None recommended by PT    Recommendations for Other Services       Precautions / Restrictions Precautions Precautions: Fall Precaution Comments: seizure like activity with movement, resolving Restrictions Weight Bearing Restrictions: No     Mobility  Bed Mobility Overal bed mobility: Needs Assistance       Supine to sit: Supervision     General bed mobility comments: Extra time and effort and use of bed rail but no physical assistance needed during sup to sit    Transfers Overall transfer level: Needs assistance Equipment used: Rolling walker (2 wheels) Transfers: Sit to/from Stand Sit to Stand: Min guard, From elevated surface           General transfer comment: Min verbal cues for hand placement and increased trunk flexion    Ambulation/Gait Ambulation/Gait assistance: Min guard Gait Distance (Feet): 20 Feet Assistive device: Rolling walker (2 wheels) Gait Pattern/deviations: Decreased step length - right, Decreased step length - left, Trunk flexed, Shuffle, Step-through pattern Gait velocity: decreased     General Gait Details: Pt able to amb without physical assist this session with no overt LOB and no adverse symptoms with gait   Stairs             Wheelchair Mobility    Modified Rankin (Stroke Patients Only)       Balance Overall balance assessment: Needs assistance  Sitting-balance support: Feet supported, Single extremity supported Sitting balance-Leahy Scale: Good     Standing balance support:  Bilateral upper extremity supported, During functional activity, Reliant on assistive device for balance Standing balance-Leahy Scale: Fair                              Cognition Arousal/Alertness: Awake/alert Behavior During Therapy: WFL for tasks assessed/performed Overall Cognitive Status: Within Functional Limits for tasks assessed                                          Exercises Total Joint Exercises Ankle Circles/Pumps: AROM, Strengthening, Both, 10 reps, 5 reps (with manual resistance) Quad Sets: Strengthening, Both, 5 reps, 10 reps Hip ABduction/ADduction: AAROM, Strengthening, Both, 10 reps Straight Leg Raises: AAROM, Strengthening, Both, 10 reps Long Arc Quad: Strengthening, Both, 10 reps Knee Flexion: Strengthening, Both, 10 reps    General Comments        Pertinent Vitals/Pain Pain Assessment Pain Assessment: No/denies pain    Home Living                          Prior Function            PT Goals (current goals can now be found in the care plan section) Progress towards PT goals: Progressing toward goals    Frequency    Min 2X/week      PT Plan Current plan remains appropriate    Co-evaluation              AM-PAC PT "6 Clicks" Mobility   Outcome Measure  Help needed turning from your back to your side while in a flat bed without using bedrails?: A Little Help needed moving from lying on your back to sitting on the side of a flat bed without using bedrails?: A Little Help needed moving to and from a bed to a chair (including a wheelchair)?: A Little Help needed standing up from a chair using your arms (e.g., wheelchair or bedside chair)?: A Little Help needed to walk in hospital room?: A Little Help needed climbing 3-5 steps with a railing? : A Lot 6 Click Score: 17    End of Session Equipment Utilized During Treatment: Gait belt;Other (comment) (placed high under arms to avoid drain) Activity  Tolerance: Patient tolerated treatment well Patient left: in chair;with call bell/phone within reach;with chair alarm set Nurse Communication: Mobility status PT Visit Diagnosis: Muscle weakness (generalized) (M62.81);Unsteadiness on feet (R26.81);Difficulty in walking, not elsewhere classified (R26.2)     Time: 0786-7544 PT Time Calculation (min) (ACUTE ONLY): 24 min  Charges:  $Gait Training: 8-22 mins $Therapeutic Exercise: 8-22 mins                    D. Scott Beatriz Settles PT, DPT 01/17/23, 3:36 PM

## 2023-01-17 NOTE — Progress Notes (Signed)
Progress Note   Patient: Tracey Bryant OIZ:[id number] DOB: 10-31-1951 DOA: 01/11/2023     6 DOS: the patient was seen and examined on 01/17/2023   Brief hospital course: 72 y.o. female with medical history significant of  CHF pef , DMII, DVT, HTN , Pafib on Eliquis, CKDIIIb +PPM,h/o CVA x 3, hx of cholecystitis now with chronic biliary drain,history of recurrent syncope followed by neuro who presents to ED in referral from rheumatology clinic s/p recurrent episode of syncope. Patient states she had these episode for over a year and noted that these episodes are not frequent and occur every 4-6 months. She state she does get an aura prior to even and that during even she states she is partially aware but too weak to speak. She states that after even she feels severely fatigued for 5-10 mins and thereafter back to her baseline.  With episodes no she noted on chest pain , n/v/d/ dysuria fever/chills. Or shortness of breath. Of note patient has similar admission 7/18 and at that time evaluation was unrevealing and it was thought that these episodes were stress induced.   1/17.  When turning head to the left patient developed room spinning and dizziness.  Added standing dose meclizine and as needed Valium for vertigo.  When sitting up with physical therapy today, she developed dizziness decreased level of alertness and slurred speech.  Symptoms resolved after few minutes. 1/18.  When working with physical therapy and they sat her up she had shaking episode and became less responsive and some slurred speech.  We checked her blood pressure when this happened and it did not drop.  Patient had similar episode when I went back into the room to see her.  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 1/19.  A.m. cortisol very low at 1.7.  No recent fills of steroids.  Will start Cortef for adrenal insufficiency.  Add on ACTH. 1/20.  Patient  walked 4 feet with physical therapy today. 1/21.  Creatinine continues to creep up to 2.64.  D/C losartan.  Fluid bolus given. 1/22.  Eliquis held after morning dose secondary to being told that she has been having nosebleeds.  Hemoglobin drifted down to 8.2.  Creatinine 2.68.  Assessment and Plan: * Acute kidney injury superimposed on chronic kidney disease ([place]) Creatinine went up from 2.33 on admission to 2.68.  Discontinue losartan.  Hold Lasix.  Adrenal insufficiency (HCC) A.m. cortisol very low at 1.7.  Continue Cortef 20 mg in the morning and 10 mg in the afternoon.  Patient doing better since starting Cortef.  Epistaxis With hemoglobin drifting down to 8.2 I will hold Eliquis today.  Will give IV Venofer.  Acute cystitis with hematuria Switched Rocephin over to Keflex.  E. coli growing out of culture.  Seizure-like activity ([place]) Psychogenic nonepileptic events.  Supportive care.  Patient declines psychiatry consultation.  As per PT did not have any episodes today.  Cholecystitis, chronic Patient has gallbladder drain in.  Decubitus ulcer of left heel, unstageable ([place]) Appreciate wound care consultation.  Heel protectors while in bed.  Present on admission.  See full description below.  Iron deficiency anemia Continue to monitor hemoglobin.  Last hemoglobin 8.2.  IV iron today.  Hypokalemia Replaced  History of DVT (deep vein thrombosis) Hold Eliquis.  Repeat sonogram of the lower extremity does not show any DVT.  Patient does have an elevated D-dimer but taking Eliquis.  Hypothyroidism,  unspecified Continue levothyroxine  Rheumatoid arthritis ([place]) Continue Plaquenil  History of CVA (cerebrovascular accident) CT scan does not show any acute infarcts.  Chronic systolic CHF (congestive heart failure) (HCC) Continue Coreg, last EF 40%.  Hold Lasix and losartan.  Paroxysmal atrial fibrillation (HCC) Risk of stroke higher holding anticoagulation.  But with  epistaxis and drifting hemoglobin I will hold for right now and then decide what to do with the blood thinner upon disposition.  Essential hypertension Continue Coreg, Imdur and Norvasc.  Discontinue losartan.  Hold Lasix.  Uncontrolled type 2 diabetes mellitus with hypoglycemia, without long-term current use of insulin (HCC) Sliding scale insulin for now holding oral hypoglycemics.        Subjective: Patient feeling a little bit better.  Stated she had a nosebleed last night.  Advised not to blow her nose.  Will hold Eliquis.  Admitted for weakness and spells that she was having.  Diagnosed with adrenal insufficiency and doing better now.  Physical Exam: Vitals:   01/17/23 0331 01/17/23 0500 01/17/23 0758 01/17/23 1626  BP: (!) 144/74  136/67 (!) 154/69  Pulse: 75  75   Resp: 18   18  Temp: 98.7 F (37.1 C)  98.4 F (36.9 C) 98.2 F (36.8 C)  TempSrc: Oral  Oral Oral  SpO2: 97%  97% 97%  Weight:  86.2 kg    Height:       Physical Exam HENT:     Head: Normocephalic.     Mouth/Throat:     Pharynx: No oropharyngeal exudate.  Eyes:     General: Lids are normal.     Conjunctiva/sclera: Conjunctivae normal.  Cardiovascular:     Rate and Rhythm: Normal rate and regular rhythm.     Heart sounds: Normal heart sounds, S1 normal and S2 normal.  Pulmonary:     Breath sounds: No decreased breath sounds, wheezing, rhonchi or rales.  Abdominal:     Palpations: Abdomen is soft.     Tenderness: There is no abdominal tenderness.  Musculoskeletal:     Right lower leg: No swelling.     Left lower leg: No swelling.  Skin:    General: Skin is warm.     Findings: No rash.     Comments: Left heel ulceration with black eschar.  Neurological:     Mental Status: She is alert and oriented to person, place, and time.     Data Reviewed: Creatinine 2.68, hemoglobin 8.2  Family Communication: Updated patient's sister on the phone  Disposition: Status is: Inpatient Remains inpatient  appropriate because: Hemoglobin Drifting down and Had a Nosebleed Last Night.  Creatinine Creeped up a Bit.  Check Labs Again Tomorrow.  Holding Eliquis.  IV Iron Today.  Planned Discharge Destination: Home with Home Health    Time spent: 28 minutes  Author: Loletha Grayer, MD 01/17/2023 6:02 PM  For on call review [email or web address].

## 2023-01-17 NOTE — Assessment & Plan Note (Addendum)
Risk of stroke higher holding anticoagulation (due to epistaxis). No bleeding since off Eliquis. Pt agreeable to try lower dose Eliquis, but had recurrent bleeding.  Stopped Eliquis.

## 2023-01-18 DIAGNOSIS — N3001 Acute cystitis with hematuria: Secondary | ICD-10-CM | POA: Diagnosis not present

## 2023-01-18 DIAGNOSIS — R040 Epistaxis: Secondary | ICD-10-CM | POA: Diagnosis not present

## 2023-01-18 DIAGNOSIS — N179 Acute kidney failure, unspecified: Secondary | ICD-10-CM | POA: Diagnosis not present

## 2023-01-18 DIAGNOSIS — E2740 Unspecified adrenocortical insufficiency: Secondary | ICD-10-CM | POA: Diagnosis not present

## 2023-01-18 LAB — BASIC METABOLIC PANEL
Anion gap: 6 (ref 5–15)
BUN: 71 mg/dL — ABNORMAL HIGH (ref 8–23)
CO2: 24 mmol/L (ref 22–32)
Calcium: 7.8 mg/dL — ABNORMAL LOW (ref 8.9–10.3)
Chloride: 107 mmol/L (ref 98–111)
Creatinine, Ser: 2.59 mg/dL — ABNORMAL HIGH (ref 0.44–1.00)
GFR, Estimated: 19 mL/min — ABNORMAL LOW (ref >60)
Glucose, Bld: 103 mg/dL — ABNORMAL HIGH (ref 70–99)
Potassium: 4.5 mmol/L (ref 3.5–5.1)
Sodium: 137 mmol/L (ref 135–145)

## 2023-01-18 LAB — GLUCOSE, CAPILLARY
Glucose-Capillary: 74 mg/dL (ref 70–99)
Glucose-Capillary: 151 mg/dL — ABNORMAL HIGH (ref 70–99)
Glucose-Capillary: 222 mg/dL — ABNORMAL HIGH (ref 70–99)
Glucose-Capillary: 208 mg/dL — ABNORMAL HIGH (ref 70–99)
Glucose-Capillary: 195 mg/dL — ABNORMAL HIGH (ref 70–99)

## 2023-01-18 LAB — ACTH: C206 ACTH: 12.4 pg/mL (ref 7.2–63.3)

## 2023-01-18 LAB — HEMOGLOBIN: Hemoglobin: 9.3 g/dL — ABNORMAL LOW (ref 12.0–15.0)

## 2023-01-18 MED ORDER — SODIUM CHLORIDE 0.9 % IV BOLUS
500.0000 mL | Freq: Once | INTRAVENOUS | Status: AC
  Administered 2023-01-18: 500 mL via INTRAVENOUS

## 2023-01-18 MED ORDER — SODIUM CHLORIDE 0.9 % IV SOLN: INTRAVENOUS | Status: DC

## 2023-01-18 NOTE — Progress Notes (Signed)
Progress Note   Patient: Tracey Bryant LFY:[id number] DOB: [birth year] DOA: 01/11/2023     7 DOS: the patient was seen and examined on 01/18/2023   Brief hospital course: 72 y.o. female with medical history significant of  CHF pef , DMII, DVT, HTN , Pafib on Eliquis, CKDIIIb +PPM,h/o CVA x 3, hx of cholecystitis now with chronic biliary drain,history of recurrent syncope followed by neuro who presents to ED in referral from rheumatology clinic s/p recurrent episode of syncope. Patient states she had these episode for over a year and noted that these episodes are not frequent and occur every 4-6 months. She state she does get an aura prior to even and that during even she states she is partially aware but too weak to speak. She states that after even she feels severely fatigued for 5-10 mins and thereafter back to her baseline.  With episodes no she noted on chest pain , n/v/d/ dysuria fever/chills. Or shortness of breath. Of note patient has similar admission 7/18 and at that time evaluation was unrevealing and it was thought that these episodes were stress induced.   1/17.  When turning head to the left patient developed room spinning and dizziness.  Added standing dose meclizine and as needed Valium for vertigo.  When sitting up with physical therapy today, she developed dizziness decreased level of alertness and slurred speech.  Symptoms resolved after few minutes. 1/18.  When working with physical therapy and they sat her up she had shaking episode and became less responsive and some slurred speech.  We checked her blood pressure when this happened and it did not drop.  Patient had similar episode when I went back into the room to see her.  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 1/19.  A.m. cortisol very low at 1.7.  No recent fills of steroids.  Will start Cortef for adrenal insufficiency.  Add on ACTH. 1/20.  Patient  walked 4 feet with physical therapy today. 1/21.  Creatinine continues to creep up to 2.64.  D/C losartan.  Fluid bolus given. 1/22.  Eliquis held after morning dose secondary to being told that she has been having nosebleeds.  Hemoglobin drifted down to 8.2.  Creatinine 2.68.  IV iron given.  Walked 20 feet with physical therapy. 1/23.  Had another nosebleed overnight.  Eliquis still on hold.  This morning's hemoglobin 9.3 and creatinine 2.59.  Patient orthostatic with physical therapy today.  Assessment and Plan: * Acute kidney injury superimposed on chronic kidney disease ([place]) Acute kidney injury on chronic kidney disease stage IV creatinine went up from 2.23 on admission to 2.68 on 1/22.  Today's creatinine 2.59.  Discontinued losartan.  Hold Lasix.  Fluid bolus today and gentle IV fluids overnight.  Adrenal insufficiency (HCC) A.m. cortisol very low at 1.7.  Continue Cortef 20 mg in the morning and 10 mg in the afternoon.  Patient doing better since starting Cortef.  Epistaxis Hemoglobin 9.3 today.  Continue to hold Eliquis today.  Need to weigh benefits and risks of Eliquis as outpatient.  IV Venofer given on 1/22.  Acute cystitis with hematuria Switched Rocephin over to Keflex and completed course.  E. coli growing out of culture.  Seizure-like activity ([place]) Psychogenic nonepileptic events.  Supportive care.  Patient declines psychiatry consultation.    Cholecystitis, chronic Patient has gallbladder drain in.  Decubitus ulcer of left heel, unstageable ([place]) Appreciate wound care consultation.  Heel protectors while in bed.  Present on admission.  See full description below.  X-ray did not show any signs of osteomyelitis.  Iron deficiency anemia Continue to monitor hemoglobin.  Last hemoglobin 9.3.  IV iron 1/22.  Hypokalemia Replaced  History of DVT (deep vein thrombosis) Hold Eliquis.  Repeat sonogram of the lower extremity does not show any DVT.    Hypothyroidism,  unspecified Continue levothyroxine  Rheumatoid arthritis ([place]) Continue Plaquenil  History of CVA (cerebrovascular accident) CT scan does not show any acute infarcts.  Chronic systolic CHF (congestive heart failure) (HCC) Continue Coreg, last EF 40%.  Hold Lasix and losartan.  Paroxysmal atrial fibrillation (HCC) Risk of stroke higher holding anticoagulation.  But with epistaxis and drifting hemoglobin I will hold for right now.  Need to weigh benefits and risks of Eliquis upon disposition.  Essential hypertension Patient orthostatic today.  Will give fluid bolus.  Continue Coreg, Imdur.  Discontinue Norvasc.  Uncontrolled type 2 diabetes mellitus with hypoglycemia, without long-term current use of insulin (HCC) Sliding scale insulin for now holding oral hypoglycemics.        Subjective: Patient was feeling okay this morning.  Did well with physical therapy yesterday.  Not as well today with her blood pressure dropping down and her feeling dizzy with standing.  Had another nosebleed last night.  Eliquis still on hold.  Creatinine still elevated and fluid bolus given this morning and will give IV fluids overnight.  Physical Exam: Vitals:   01/17/23 2139 01/17/23 2209 01/18/23 0558 01/18/23 1534  BP: 137/80 (!) 141/77 (!) 151/80 111/66  Pulse: 75 77 78 76  Resp: '16 15 14 17  '$ Temp: 97.8 F (36.6 C) 98.3 F (36.8 C) 98 F (36.7 C) 98.2 F (36.8 C)  TempSrc: Oral Oral Oral   SpO2: 100% 99% 96% 98%  Weight:      Height:       Physical Exam HENT:     Head: Normocephalic.     Mouth/Throat:     Pharynx: No oropharyngeal exudate.  Eyes:     General: Lids are normal.     Conjunctiva/sclera: Conjunctivae normal.  Cardiovascular:     Rate and Rhythm: Normal rate and regular rhythm.     Heart sounds: Normal heart sounds, S1 normal and S2 normal.  Pulmonary:     Breath sounds: No decreased breath sounds, wheezing, rhonchi or rales.  Abdominal:     Palpations: Abdomen is  soft.     Tenderness: There is no abdominal tenderness.  Musculoskeletal:     Right lower leg: No swelling.     Left lower leg: No swelling.  Skin:    General: Skin is warm.     Findings: No rash.     Comments: Left heel ulceration with black eschar.  Neurological:     Mental Status: She is alert and oriented to person, place, and time.     Data Reviewed: ACTH 12.4, creatinine 2.59, BUN 71, hemoglobin 9.3  Family Communication: Spoke with patient's sister on the phone (she is the main contact)  Disposition: Status is: Inpatient Remains inpatient appropriate because: Orthostatic today.  Creatinine still elevated.  Had a nosebleed last night.  Planned Discharge Destination: Home and Home with Home Health    Time spent: 28 minutes  Author: Loletha Grayer, MD 01/18/2023 5:22 PM  For on call review [email or web address].

## 2023-01-18 NOTE — Plan of Care (Signed)
Patient AOX4, VSS throughout shift.  All meds given on time as ordered.  Denied pain and SOB.  Diminished lungs, IS encouraged.  Purewick remains in place.  Pt tolerated IV iron well o/n.  POC maintained, will continue to monitor.  Problem: Education: Goal: Knowledge of condition and prescribed therapy will improve Outcome: Progressing   Problem: Cardiac: Goal: Will achieve and/or maintain adequate cardiac output Outcome: Progressing   Problem: Physical Regulation: Goal: Complications related to the disease process, condition or treatment will be avoided or minimized Outcome: Progressing   Problem: Education: Goal: Knowledge of General Education information will improve Description: Including pain rating scale, medication(s)/side effects and non-pharmacologic comfort measures Outcome: Progressing   Problem: Health Behavior/Discharge Planning: Goal: Ability to manage health-related needs will improve Outcome: Progressing   Problem: Clinical Measurements: Goal: Ability to maintain clinical measurements within normal limits will improve Outcome: Progressing Goal: Will remain free from infection Outcome: Progressing Goal: Diagnostic test results will improve Outcome: Progressing Goal: Respiratory complications will improve Outcome: Progressing Goal: Cardiovascular complication will be avoided Outcome: Progressing   Problem: Activity: Goal: Risk for activity intolerance will decrease Outcome: Progressing   Problem: Nutrition: Goal: Adequate nutrition will be maintained Outcome: Progressing   Problem: Coping: Goal: Level of anxiety will decrease Outcome: Progressing   Problem: Elimination: Goal: Will not experience complications related to bowel motility Outcome: Progressing Goal: Will not experience complications related to urinary retention Outcome: Progressing   Problem: Pain Managment: Goal: General experience of comfort will improve Outcome: Progressing    Problem: Safety: Goal: Ability to remain free from injury will improve Outcome: Progressing   Problem: Skin Integrity: Goal: Risk for impaired skin integrity will decrease Outcome: Progressing   Problem: Education: Goal: Ability to describe self-care measures that may prevent or decrease complications (Diabetes Survival Skills Education) will improve Outcome: Progressing Goal: Individualized Educational Video(s) Outcome: Progressing   Problem: Coping: Goal: Ability to adjust to condition or change in health will improve Outcome: Progressing   Problem: Fluid Volume: Goal: Ability to maintain a balanced intake and output will improve Outcome: Progressing   Problem: Health Behavior/Discharge Planning: Goal: Ability to identify and utilize available resources and services will improve Outcome: Progressing Goal: Ability to manage health-related needs will improve Outcome: Progressing   Problem: Metabolic: Goal: Ability to maintain appropriate glucose levels will improve Outcome: Progressing   Problem: Nutritional: Goal: Maintenance of adequate nutrition will improve Outcome: Progressing Goal: Progress toward achieving an optimal weight will improve Outcome: Progressing   Problem: Skin Integrity: Goal: Risk for impaired skin integrity will decrease Outcome: Progressing   Problem: Tissue Perfusion: Goal: Adequacy of tissue perfusion will improve Outcome: Progressing

## 2023-01-18 NOTE — Progress Notes (Addendum)
Physical Therapy Treatment Patient Details Name: Tracey Bryant MRN: [id number] DOB: [birth year] Today's Date: 01/18/2023   History of Present Illness Tracey Bryant is a 72 y/o female presented to ED on 01/11/23 for recurrent syncope. MD assessement includes: Acute kidney injury superimposed on chronic kidney disease, adrenal insufficiency,  psychogenic nonepileptic events, decubitus ulcer of left heel, unstageable, iron deficiency anemia, and hypokalemia.  PMH includes arthritis, CHF, DM II, DVT, HTN, afib, and stroke. hx of cholecystitis now with chronic biliary drain.    PT Comments    Pt in flat bed, lights out, attempting to rest, reports sore all over and fatigued from previous day's effort. Pt moves to EOB with labored effort, but not physical assist needs, is dizzy upon sitting EOB, but this improved over 3 minutes. Pt remains weak in legs, unable to rise to standing from standard surface height without minA or elevation of surface, even then, multiple reps are limited. Pt has recurrent presyncope and orthostatic drops today, less feelings of passing out, but does have typical decrease in alertness and progressive weakness in legs. Pt has recurrent epistaxis upon sitting EOB, this is managed during session. Pt also incontinent of bowel prior to arrival which was a mid-session discovery that warranted attention. Pt fairly needs assist with pericare ultimately, reports unable to clean self since hip fracture several months ago. Pt remains very weak and orthostatic, presyncopal. Does not see like a safe DC to home is possible today or tomorrow. Will continue to follow.  Orthostatic VS for the past 24 hrs:  BP- Lying Pulse- Lying BP- Sitting Pulse- Sitting BP- Standing at 0 minutes Pulse- Standing at 0 minutes  01/18/23 1115 151/80 78 146/84 81 (!) 114/95 80        Recommendations for follow up therapy are one component of a multi-disciplinary discharge planning process, led by the attending  physician.  Recommendations may be updated based on patient status, additional functional criteria and insurance authorization.  Follow Up Recommendations  Skilled nursing-short term rehab (<3 hours/day) Can patient physically be transported by private vehicle: Yes   Assistance Recommended at Discharge Frequent or constant Supervision/Assistance  Patient can return home with the following A lot of help with walking and/or transfers;A lot of help with bathing/dressing/bathroom;Assistance with cooking/housework;Assist for transportation;Help with stairs or ramp for entrance;Direct supervision/assist for medications management;Direct supervision/assist for financial management   Equipment Recommendations  None recommended by PT    Recommendations for Other Services       Precautions / Restrictions Precautions Precautions: Fall Restrictions Weight Bearing Restrictions: No     Mobility  Bed Mobility Overal bed mobility: Needs Assistance Bed Mobility: Supine to Sit, Sit to Supine     Supine to sit: Min guard Sit to supine: Min guard   General bed mobility comments: minA and setup for scooting up in bed,    Transfers Overall transfer level: Needs assistance Equipment used: Rolling walker (2 wheels) Transfers: Sit to/from Stand Sit to Stand: Min assist           General transfer comment: limited use of amrs due to weakness and soreness    Ambulation/Gait Ambulation/Gait assistance:  (struggling with sustain standing tolerance, presyncope, weakness with transfers, AMB not biggest focus today)                 Stairs             Wheelchair Mobility    Modified Rankin (Stroke Patients Only)       Balance  Cognition Arousal/Alertness: Awake/alert (declines with BP drops, more drowsy at times) Behavior During Therapy: WFL for tasks assessed/performed Overall Cognitive Status: Within  Functional Limits for tasks assessed                                          Exercises Other Exercises Other Exercises: STS from elevated EOB c sustained standing for balance 3x30sec, then 1x2 minutes for pericare/selfpericare atempts    General Comments        Pertinent Vitals/Pain Pain Assessment Pain Assessment:  (sore all over from previous day's PT/mobility tasks)    Home Living                          Prior Function            PT Goals (current goals can now be found in the care plan section) Acute Rehab PT Goals Patient Stated Goal: to be able to go home PT Goal Formulation: With patient Time For Goal Achievement: 01/26/23 Potential to Achieve Goals: Fair Progress towards PT goals: Progressing toward goals    Frequency    Min 2X/week      PT Plan Current plan remains appropriate    Co-evaluation              AM-PAC PT "6 Clicks" Mobility   Outcome Measure  Help needed turning from your back to your side while in a flat bed without using bedrails?: A Little Help needed moving from lying on your back to sitting on the side of a flat bed without using bedrails?: A Little Help needed moving to and from a bed to a chair (including a wheelchair)?: A Lot Help needed standing up from a chair using your arms (e.g., wheelchair or bedside chair)?: A Lot Help needed to walk in hospital room?: A Lot Help needed climbing 3-5 steps with a railing? : A Lot 6 Click Score: 14    End of Session Equipment Utilized During Treatment: Gait belt Activity Tolerance: Treatment limited secondary to medical complications (Comment) (bowel incontinence, presyncope/orthostasis, epistaxis) Patient left: with call bell/phone within reach;in bed (elevated in bed chair positon 45 degrees trunk) Nurse Communication: Mobility status PT Visit Diagnosis: Muscle weakness (generalized) (M62.81);Unsteadiness on feet (R26.81);Difficulty in walking, not  elsewhere classified (R26.2)     Time: 0350-0938 PT Time Calculation (min) (ACUTE ONLY): 44 min  Charges:  $Therapeutic Exercise: 23-37 mins $Therapeutic Activity: 8-22 mins                    1:43 PM, 01/18/23 Etta Grandchild, PT, DPT Physical Therapist - [facility]  [phone] ([place])    Jahaan Vanwagner C 01/18/2023, 1:39 PM

## 2023-01-19 DIAGNOSIS — N179 Acute kidney failure, unspecified: Secondary | ICD-10-CM | POA: Diagnosis not present

## 2023-01-19 DIAGNOSIS — K811 Chronic cholecystitis: Secondary | ICD-10-CM | POA: Diagnosis not present

## 2023-01-19 DIAGNOSIS — E11649 Type 2 diabetes mellitus with hypoglycemia without coma: Secondary | ICD-10-CM | POA: Diagnosis not present

## 2023-01-19 DIAGNOSIS — E2740 Unspecified adrenocortical insufficiency: Secondary | ICD-10-CM | POA: Diagnosis not present

## 2023-01-19 LAB — GLUCOSE, CAPILLARY
Glucose-Capillary: 182 mg/dL — ABNORMAL HIGH (ref 70–99)
Glucose-Capillary: 212 mg/dL — ABNORMAL HIGH (ref 70–99)
Glucose-Capillary: 228 mg/dL — ABNORMAL HIGH (ref 70–99)
Glucose-Capillary: 257 mg/dL — ABNORMAL HIGH (ref 70–99)

## 2023-01-19 NOTE — Progress Notes (Signed)
Occupational Therapy Treatment Patient Details Name: Tracey Bryant MRN: [id number] DOB: 10/28/1951 Today's Date: 01/19/2023   History of present illness Tracey Bryant is a 72 y/o female presented to ED on 01/11/23 for recurrent syncope. MD assessement includes: Acute kidney injury superimposed on chronic kidney disease, adrenal insufficiency,  psychogenic nonepileptic events, decubitus ulcer of left heel, unstageable, iron deficiency anemia, and hypokalemia.  PMH includes arthritis, CHF, DM II, DVT, HTN, afib, and stroke. hx of cholecystitis now with chronic biliary drain.   OT comments  Tracey Bryant was seen for OT treatment on this date. Upon arrival to room pt reclined in bed, agreeable to tx. Pt requires SBA sup<>sit, SUPERVISION seated grooming tasks. Denies dizziness sitting this session. MIN A + RW sit<>stand x2 from standard bed height, assist to stabilize RW only, tolerates ~1-2 min each stand. MAX A pericare standing. Pt making good progress toward goals, will continue to follow POC. Discharge recommendation remains appropriate.     Recommendations for follow up therapy are one component of a multi-disciplinary discharge planning process, led by the attending physician.  Recommendations may be updated based on patient status, additional functional criteria and insurance authorization.    Follow Up Recommendations  Skilled nursing-short term rehab (<3 hours/day)     Assistance Recommended at Discharge Frequent or constant Supervision/Assistance  Patient can return home with the following  A lot of help with bathing/dressing/bathroom;Assistance with cooking/housework;Assist for transportation;Help with stairs or ramp for entrance;A little help with walking and/or transfers   Equipment Recommendations  Other (comment) (defer)    Recommendations for Other Services      Precautions / Restrictions Precautions Precautions: Fall Restrictions Weight Bearing Restrictions: No        Mobility Bed Mobility Overal bed mobility: Needs Assistance Bed Mobility: Supine to Sit, Sit to Supine     Supine to sit: Min guard Sit to supine: Min guard        Transfers Overall transfer level: Needs assistance Equipment used: Rolling walker (2 wheels) Transfers: Sit to/from Stand Sit to Stand: Min assist           General transfer comment: assist to stabilize RW only, completes x3 stands from standard bed height, tolerates ~1-2 min each stand     Balance Overall balance assessment: Needs assistance Sitting-balance support: Feet supported, Single extremity supported Sitting balance-Leahy Scale: Good     Standing balance support: Bilateral upper extremity supported, During functional activity, Reliant on assistive device for balance Standing balance-Leahy Scale: Fair                             ADL either performed or assessed with clinical judgement   ADL Overall ADL's : Needs assistance/impaired                                       General ADL Comments: SUPERVISION seated grooming tasks. MAX A doff B socks seated EOB. MIN A + RW simulated BSC t/f. MAX A pericare standing      Cognition Arousal/Alertness: Awake/alert Behavior During Therapy: WFL for tasks assessed/performed Overall Cognitive Status: Within Functional Limits for tasks assessed                                 General Comments: follows cues well  Pertinent Vitals/ Pain       Pain Assessment Pain Assessment: Faces Faces Pain Scale: Hurts even more Pain Location: LLE Pain Descriptors / Indicators: Sore, Tender Pain Intervention(s): Limited activity within patient's tolerance, Repositioned   Frequency  Min 2X/week        Progress Toward Goals  OT Goals(current goals can now be found in the care plan section)  Progress towards OT goals: Progressing toward goals  Acute Rehab OT Goals Patient Stated Goal: to  walk OT Goal Formulation: With patient Time For Goal Achievement: 01/26/23 Potential to Achieve Goals: Good ADL Goals Pt Will Perform Grooming: with modified independence;standing Pt Will Perform Lower Body Dressing: with modified independence;sit to/from stand Pt Will Transfer to Toilet: with modified independence;ambulating Pt Will Perform Toileting - Clothing Manipulation and hygiene: with modified independence;sit to/from stand  Plan Discharge plan remains appropriate;Frequency remains appropriate    Co-evaluation                 AM-PAC OT "6 Clicks" Daily Activity     Outcome Measure   Help from another person eating meals?: None Help from another person taking care of personal grooming?: None Help from another person toileting, which includes using toliet, bedpan, or urinal?: A Lot Help from another person bathing (including washing, rinsing, drying)?: A Lot Help from another person to put on and taking off regular upper body clothing?: A Little Help from another person to put on and taking off regular lower body clothing?: A Lot 6 Click Score: 17    End of Session    OT Visit Diagnosis: Dizziness and giddiness (R42);Other abnormalities of gait and mobility (R26.89);Muscle weakness (generalized) (M62.81)   Activity Tolerance Patient tolerated treatment well   Patient Left in bed;with call bell/phone within reach;with nursing/sitter in room   Nurse Communication          Time: 1429-1450 OT Time Calculation (min): 21 min  Charges: OT General Charges $OT Visit: 1 Visit OT Treatments $Self Care/Home Management : 8-22 mins  Dessie Coma, M.S. OTR/L  01/19/23, 3:02 PM  ascom [phone]

## 2023-01-19 NOTE — Progress Notes (Signed)
Progress Note   Patient: Tracey Bryant EVO:[id number] DOB: [birth year] DOA: 01/11/2023     8 DOS: the patient was seen and examined on 01/19/2023   Brief hospital course: 72 y.o. female with medical history significant of  CHF pef , DMII, DVT, HTN , Pafib on Eliquis, CKDIIIb +PPM,h/o CVA x 3, hx of cholecystitis now with chronic biliary drain,history of recurrent syncope followed by neuro who presents to ED in referral from rheumatology clinic s/p recurrent episode of syncope. Patient states she had these episode for over a year and noted that these episodes are not frequent and occur every 4-6 months. She state she does get an aura prior to even and that during even she states she is partially aware but too weak to speak. She states that after even she feels severely fatigued for 5-10 mins and thereafter back to her baseline.  With episodes no she noted on chest pain , n/v/d/ dysuria fever/chills. Or shortness of breath. Of note patient has similar admission 7/18 and at that time evaluation was unrevealing and it was thought that these episodes were stress induced.   1/17.  When turning head to the left patient developed room spinning and dizziness.  Added standing dose meclizine and as needed Valium for vertigo.  When sitting up with physical therapy today, she developed dizziness decreased level of alertness and slurred speech.  Symptoms resolved after few minutes. 1/18.  When working with physical therapy and they sat her up she had shaking episode and became less responsive and some slurred speech.  We checked her blood pressure when this happened and it did not drop.  Patient had similar episode when I went back into the room to see her.  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 1/19.  A.m. cortisol very low at 1.7.  No recent fills of steroids.  Will start Cortef for adrenal insufficiency.  Add on ACTH. 1/20.  Patient  walked 4 feet with physical therapy today. 1/21.  Creatinine continues to creep up to 2.64.  D/C losartan.  Fluid bolus given. 1/22.  Eliquis held after morning dose secondary to being told that she has been having nosebleeds.  Hemoglobin drifted down to 8.2.  Creatinine 2.68.  IV iron given.  Walked 20 feet with physical therapy. 1/23.  Had another nosebleed overnight.  Eliquis still on hold.  This morning's hemoglobin 9.3 and creatinine 2.59.  Patient orthostatic with physical therapy today.  Assessment and Plan: * Acute kidney injury superimposed on chronic kidney disease ([place]) Acute kidney injury on chronic kidney disease stage IV creatinine went up from 2.23 on admission to 2.68 on 1/22.  Today's creatinine 2.59.  Discontinued losartan.  Hold Lasix.  Fluid bolus given 1/23 and gentle IV fluids overnight. Monitor BMP  Adrenal insufficiency (HCC) A.m. cortisol very low at 1.7.  Continue Cortef 20 mg in the morning and 10 mg in the afternoon.  Patient doing better since starting Cortef.  Epistaxis Hemoglobin 9.3 today.  Continue to hold Eliquis today.  Need to weigh benefits and risks of Eliquis as outpatient.  IV Venofer given on 1/22.  Acute cystitis with hematuria Initially on Rocephin, transitioned to Keflex and completed course.  Urine culture grew E. coli.  Seizure-like activity ([place]) Psychogenic nonepileptic events.  Supportive care.  Patient declines psychiatry consultation.    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  Decubitus ulcer of left heel, unstageable ([place]) Appreciate wound  care consultation.  Heel protectors while in bed.  Present on admission.  See full description below.  X-ray did not show any signs of osteomyelitis.  Iron deficiency anemia Continue to monitor hemoglobin.  Last hemoglobin 9.3.  IV iron 1/22.  Hypokalemia Replaced.  Monitor BMP, replace K as needed.  History of DVT (deep vein thrombosis) Hold Eliquis.  Repeat U/S of the lower  extremity does not show any DVT.    Hypothyroidism, unspecified Continue levothyroxine  Rheumatoid arthritis ([place]) Continue Plaquenil  History of CVA (cerebrovascular accident) CT scan does not show any acute infarcts.  Chronic systolic CHF (congestive heart failure) (HCC) Continue Coreg, last EF 40%.   Hold Lasix and losartan until renal fx improves.  Paroxysmal atrial fibrillation (HCC) Risk of stroke higher holding anticoagulation.  But with epistaxis and drifting hemoglobin I will hold for right now.  Need to weigh benefits and risks of Eliquis upon disposition.  Essential hypertension Patient orthostatic on 1/23, given fluid bolus.  Continue Coreg, Imdur.  Discontinue Norvasc.  Uncontrolled type 2 diabetes mellitus with hypoglycemia, without long-term current use of insulin (HCC) Sliding scale insulin for now holding oral hypoglycemics.        Subjective: Patient was sleeping comfortable but woke easily to voice.  She reports feeling very tired today but otherwise denies acute complaints including N/V, CP, SOB.  Physical Exam: Vitals:   01/18/23 2031 01/19/23 0440 01/19/23 0500 01/19/23 0827  BP: (!) 128/96 120/60  122/60  Pulse: 73 75  74  Resp: '18 18  20  '$ Temp: 98.2 F (36.8 C) 97.7 F (36.5 C)  97.7 F (36.5 C)  TempSrc:  Oral  Oral  SpO2: 98% 96%  97%  Weight:   85 kg   Height:       General exam: somnolent but arousable, no acute distress HEENT keeps eyes closed, moist mucus membranes, hearing grossly normal  Respiratory system: CTAB diminished bases, no wheezes, rales or rhonchi, normal respiratory effort. Cardiovascular system: normal S1/S2, RRR   Gastrointestinal system: soft, NT, ND, no HSM felt, +bowel sounds. Central nervous system: A&O x self and place at least. no gross focal neurologic deficits, normal speech Extremities: moves all, no edema, normal tone Skin: dry, intact, normal temperature Psychiatry: normal mood, congruent affect, judgement  and insight appear normal     Data Reviewed:  Notable labs -- Cr 2.68 >> 2.59, GFR 19, glucose 103, Ca 7.8.  Hbg 9.3 from 8.2.   Family Communication: Spoke with patient's sister on the phone (she is the main contact)  Disposition: Status is: Inpatient Remains inpatient appropriate because: Orthostatic today.  Creatinine still elevated.  Had a nosebleed last night.  Planned Discharge Destination: Home and Home with Home Health    Time spent: 36 minutes  Author: Ezekiel Slocumb, DO 01/19/2023 1:48 PM  For on call review [email or web address].

## 2023-01-20 ENCOUNTER — Inpatient Hospital Stay: Payer: Medicare HMO

## 2023-01-20 DIAGNOSIS — N179 Acute kidney failure, unspecified: Secondary | ICD-10-CM | POA: Diagnosis not present

## 2023-01-20 DIAGNOSIS — N189 Chronic kidney disease, unspecified: Secondary | ICD-10-CM | POA: Diagnosis not present

## 2023-01-20 LAB — BASIC METABOLIC PANEL
Anion gap: 7 (ref 5–15)
BUN: 76 mg/dL — ABNORMAL HIGH (ref 8–23)
CO2: 20 mmol/L — ABNORMAL LOW (ref 22–32)
Calcium: 7.5 mg/dL — ABNORMAL LOW (ref 8.9–10.3)
Chloride: 107 mmol/L (ref 98–111)
Creatinine, Ser: 2.6 mg/dL — ABNORMAL HIGH (ref 0.44–1.00)
GFR, Estimated: 19 mL/min — ABNORMAL LOW (ref >60)
Glucose, Bld: 183 mg/dL — ABNORMAL HIGH (ref 70–99)
Potassium: 4.9 mmol/L (ref 3.5–5.1)
Sodium: 134 mmol/L — ABNORMAL LOW (ref 135–145)

## 2023-01-20 LAB — MAGNESIUM: Magnesium: 2.5 mg/dL — ABNORMAL HIGH (ref 1.7–2.4)

## 2023-01-20 LAB — GLUCOSE, CAPILLARY
Glucose-Capillary: 131 mg/dL — ABNORMAL HIGH (ref 70–99)
Glucose-Capillary: 156 mg/dL — ABNORMAL HIGH (ref 70–99)
Glucose-Capillary: 195 mg/dL — ABNORMAL HIGH (ref 70–99)
Glucose-Capillary: 218 mg/dL — ABNORMAL HIGH (ref 70–99)

## 2023-01-20 LAB — PHOSPHORUS: Phosphorus: 4.1 mg/dL (ref 2.5–4.6)

## 2023-01-20 MED ORDER — APIXABAN 2.5 MG PO TABS
2.5000 mg | ORAL_TABLET | Freq: Two times a day (BID) | ORAL | Status: DC
  Administered 2023-01-20 – 2023-01-21 (×2): 2.5 mg via ORAL
  Filled 2023-01-20 (×2): qty 1

## 2023-01-20 MED ORDER — INSULIN ASPART 100 UNIT/ML IJ SOLN
3.0000 [IU] | Freq: Three times a day (TID) | INTRAMUSCULAR | Status: DC
  Administered 2023-01-20 – 2023-01-23 (×7): 3 [IU] via SUBCUTANEOUS
  Filled 2023-01-20 (×7): qty 1

## 2023-01-20 NOTE — Inpatient Diabetes Management (Signed)
Inpatient Diabetes Program Recommendations  AACE/ADA: New Consensus Statement on Inpatient Glycemic Control   Target Ranges:  Prepandial:   less than 140 mg/dL      Peak postprandial:   less than 180 mg/dL (1-2 hours)      Critically ill patients:  140 - 180 mg/dL    Latest Reference Range & Units 01/19/23 08:30 01/19/23 12:16 01/19/23 16:35 01/19/23 21:07 01/20/23 08:06  Glucose-Capillary 70 - 99 mg/dL 182 (H) 212 (H) 228 (H) 257 (H) 131 (H)    Review of Glycemic Control  Current orders for Inpatient glycemic control: Novolog 0-9 units TID with meals, Novolog 0-5 units QHS; Cortef 20 mg daily, Cortef 10 mg daily  Inpatient Diabetes Program Recommendations:    Insulin: If steroids are continued, please consider ordering Novolog 3 units TID with meals for meal coverage if patient eats at least 50% of meals.   Thanks, Barnie Alderman, RN, MSN, [place] Diabetes Coordinator Inpatient Diabetes Program [phone] (Team Pager from 8am to [place])

## 2023-01-20 NOTE — Care Management Important Message (Signed)
Important Message  Patient Details  Name: Tracey Bryant MRN: [id number] Date of Birth: 12/03/1951   Medicare Important Message Given:  Yes     Dannette Barbara 01/20/2023, 2:10 PM

## 2023-01-20 NOTE — Progress Notes (Signed)
Physical Therapy Treatment Patient Details Name: Tracey Bryant MRN: [id number] DOB: 1951/11/25 Today's Date: 01/20/2023   History of Present Illness Tracey Bryant is a 72 y/o female presented to ED on 01/11/23 for recurrent syncope. MD assessement includes: Acute kidney injury superimposed on chronic kidney disease, adrenal insufficiency,  psychogenic nonepileptic events, decubitus ulcer of left heel, unstageable, iron deficiency anemia, and hypokalemia.  PMH includes arthritis, CHF, DM II, DVT, HTN, afib, and stroke. hx of cholecystitis now with chronic biliary drain.    PT Comments    Patient received on [facility] with OT present in room. Patient is able to stand from [facility] without assist other than supervision. Patient is able to ambulate 20 feet with RW and supervision. No lob. She is limited by fatigue. Patient will continue to benefit from skilled PT to improve functional independence, strengthening, and safety.      Recommendations for follow up therapy are one component of a multi-disciplinary discharge planning process, led by the attending physician.  Recommendations may be updated based on patient status, additional functional criteria and insurance authorization.  Follow Up Recommendations  Home health PT Can patient physically be transported by private vehicle: Yes   Assistance Recommended at Discharge Intermittent Supervision/Assistance  Patient can return home with the following A little help with walking and/or transfers;A little help with bathing/dressing/bathroom;Assist for transportation;Help with stairs or ramp for entrance;Assistance with cooking/housework   Equipment Recommendations  None recommended by PT    Recommendations for Other Services       Precautions / Restrictions Precautions Precautions: Fall Precaution Comments: patient reports left foot pain with mobility ( not new) Restrictions Weight Bearing Restrictions: No     Mobility  Bed Mobility   Bed  Mobility: Sit to Supine       Sit to supine: Modified independent (Device/Increase time)        Transfers Overall transfer level: Needs assistance Equipment used: Rolling walker (2 wheels) Transfers: Sit to/from Stand Sit to Stand: Supervision           General transfer comment: Able to stand from [facility] without physical assist    Ambulation/Gait Ambulation/Gait assistance: Supervision Gait Distance (Feet): 20 Feet Assistive device: Rolling walker (2 wheels) Gait Pattern/deviations: Decreased step length - right, Decreased step length - left, Trunk flexed, Shuffle, Step-through pattern Gait velocity: decreased     General Gait Details: Pt able to amb without physical assist this session with no overt LOB and no adverse symptoms with gait   Stairs             Wheelchair Mobility    Modified Rankin (Stroke Patients Only)       Balance Overall balance assessment: Needs assistance Sitting-balance support: Feet supported Sitting balance-Leahy Scale: Good     Standing balance support: Bilateral upper extremity supported, During functional activity, Reliant on assistive device for balance Standing balance-Leahy Scale: Good                              Cognition Arousal/Alertness: Awake/alert Behavior During Therapy: WFL for tasks assessed/performed Overall Cognitive Status: Within Functional Limits for tasks assessed                                 General Comments: follows cues well        Exercises      General Comments  Pertinent Vitals/Pain Pain Assessment Pain Assessment: Faces Faces Pain Scale: Hurts a little bit Pain Location: LLE Pain Descriptors / Indicators: Discomfort, Sore Pain Intervention(s): Monitored during session    Home Living                          Prior Function            PT Goals (current goals can now be found in the care plan section) Acute Rehab PT Goals Patient  Stated Goal: to be able to go home PT Goal Formulation: With patient Time For Goal Achievement: 01/26/23 Potential to Achieve Goals: Good Progress towards PT goals: Progressing toward goals    Frequency    Min 2X/week      PT Plan Discharge plan needs to be updated    Co-evaluation              AM-PAC PT "6 Clicks" Mobility   Outcome Measure  Help needed turning from your back to your side while in a flat bed without using bedrails?: None Help needed moving from lying on your back to sitting on the side of a flat bed without using bedrails?: A Little Help needed moving to and from a bed to a chair (including a wheelchair)?: A Little Help needed standing up from a chair using your arms (e.g., wheelchair or bedside chair)?: A Little Help needed to walk in hospital room?: A Little Help needed climbing 3-5 steps with a railing? : A Lot 6 Click Score: 18    End of Session   Activity Tolerance: Treatment limited secondary to medical complications (Comment) Patient left: with call bell/phone within reach;in bed;with bed alarm set Nurse Communication: Mobility status PT Visit Diagnosis: Other abnormalities of gait and mobility (R26.89);Muscle weakness (generalized) (M62.81);Difficulty in walking, not elsewhere classified (R26.2)     Time: 6378-5885 PT Time Calculation (min) (ACUTE ONLY): 15 min  Charges:  $Gait Training: 8-22 mins                     Vitoria Conyer, PT, GCS 01/20/23,3:37 PM

## 2023-01-20 NOTE — Progress Notes (Signed)
Occupational Therapy Treatment Patient Details Name: Tracey Bryant MRN: [id number] DOB: Mar 30, 1951 Today's Date: 01/20/2023   History of present illness Tracey Bryant is a 72 y/o female presented to ED on 01/11/23 for recurrent syncope. MD assessement includes: Acute kidney injury superimposed on chronic kidney disease, adrenal insufficiency,  psychogenic nonepileptic events, decubitus ulcer of left heel, unstageable, iron deficiency anemia, and hypokalemia.  PMH includes arthritis, CHF, DM II, DVT, HTN, afib, and stroke. hx of cholecystitis now with chronic biliary drain.   OT comments  Tracey Bryant was seen for OT treatment on this date. Upon arrival to room pt reclined in bed, agreeable to tx. Pt requires rail use only exit bed. SUPERVISION seated grooming tasks. MAX A doff B socks seated EOB. MIN A + RW for BSC t/f and sit<>stand x2, tolerates ~3 min. MAX A pericare standing. PT in at end of session. Pt making good progress toward goals, will continue to follow POC. Discharge recommendation remains appropriate however pt may d/c home with assistance for OOB mobility.     Recommendations for follow up therapy are one component of a multi-disciplinary discharge planning process, led by the attending physician.  Recommendations may be updated based on patient status, additional functional criteria and insurance authorization.    Follow Up Recommendations  Skilled nursing-short term rehab (<3 hours/day)     Assistance Recommended at Discharge Frequent or constant Supervision/Assistance  Patient can return home with the following  A lot of help with bathing/dressing/bathroom;Assistance with cooking/housework;Assist for transportation;Help with stairs or ramp for entrance;A little help with walking and/or transfers   Equipment Recommendations  Other (comment) (defer)    Recommendations for Other Services      Precautions / Restrictions Precautions Precautions: Fall Restrictions Weight  Bearing Restrictions: No       Mobility Bed Mobility Overal bed mobility: Needs Assistance Bed Mobility: Supine to Sit     Supine to sit: Min guard          Transfers Overall transfer level: Needs assistance Equipment used: Rolling walker (2 wheels) Transfers: Sit to/from Stand, Bed to chair/wheelchair/BSC Sit to Stand: Min assist     Step pivot transfers: Min guard     General transfer comment: assist to stabilize RW and lift off from standard bed height, stands x2, tolerates ~3 min each     Balance Overall balance assessment: Needs assistance Sitting-balance support: Feet supported, Single extremity supported Sitting balance-Leahy Scale: Good     Standing balance support: Bilateral upper extremity supported, During functional activity, Reliant on assistive device for balance Standing balance-Leahy Scale: Fair                             ADL either performed or assessed with clinical judgement   ADL Overall ADL's : Needs assistance/impaired                                       General ADL Comments: SUPERVISION seated grooming tasks. MAX A doff B socks seated EOB. MIN A + RW for BSC t/f. MAX A pericare standing      Cognition Arousal/Alertness: Awake/alert Behavior During Therapy: WFL for tasks assessed/performed Overall Cognitive Status: Within Functional Limits for tasks assessed  General Comments: follows cues well         Pertinent Vitals/ Pain       Pain Assessment Pain Assessment: Faces Faces Pain Scale: Hurts little more Pain Location: LLE Pain Descriptors / Indicators: Sore, Tender Pain Intervention(s): Limited activity within patient's tolerance, Repositioned   Frequency  Min 2X/week        Progress Toward Goals  OT Goals(current goals can now be found in the care plan section)  Progress towards OT goals: Progressing toward goals  Acute Rehab OT  Goals Patient Stated Goal: go home OT Goal Formulation: With patient Time For Goal Achievement: 01/26/23 Potential to Achieve Goals: Good ADL Goals Pt Will Perform Grooming: with modified independence;standing Pt Will Perform Lower Body Dressing: with modified independence;sit to/from stand Pt Will Transfer to Toilet: with modified independence;ambulating Pt Will Perform Toileting - Clothing Manipulation and hygiene: with modified independence;sit to/from stand  Plan Discharge plan remains appropriate;Frequency remains appropriate    Co-evaluation                 AM-PAC OT "6 Clicks" Daily Activity     Outcome Measure   Help from another person eating meals?: None Help from another person taking care of personal grooming?: None Help from another person toileting, which includes using toliet, bedpan, or urinal?: A Lot Help from another person bathing (including washing, rinsing, drying)?: A Lot Help from another person to put on and taking off regular upper body clothing?: A Little Help from another person to put on and taking off regular lower body clothing?: A Lot 6 Click Score: 17    End of Session    OT Visit Diagnosis: Dizziness and giddiness (R42);Other abnormalities of gait and mobility (R26.89);Muscle weakness (generalized) (M62.81)   Activity Tolerance Patient tolerated treatment well   Patient Left in bed;with call bell/phone within reach;with nursing/sitter in room   Nurse Communication          Time: 1540-0867 OT Time Calculation (min): 15 min  Charges: OT General Charges $OT Visit: 1 Visit OT Treatments $Self Care/Home Management : 8-22 mins  Dessie Coma, M.S. OTR/L  01/20/23, 3:19 PM  ascom [phone]

## 2023-01-20 NOTE — Progress Notes (Addendum)
Progress Note   Patient: Tracey Bryant IZT:[id number] DOB: Jan 08, 1951 DOA: 01/11/2023     9 DOS: the patient was seen and examined on 01/20/2023   Brief hospital course: 72 y.o. female with medical history significant of  CHF pef , DMII, DVT, HTN , Pafib on Eliquis, CKDIIIb +PPM,h/o CVA x 3, hx of cholecystitis now with chronic biliary drain,history of recurrent syncope followed by neuro who presents to ED in referral from rheumatology clinic s/p recurrent episode of syncope. Patient states she had these episode for over a year and noted that these episodes are not frequent and occur every 4-6 months. She state she does get an aura prior to even and that during even she states she is partially aware but too weak to speak. She states that after even she feels severely fatigued for 5-10 mins and thereafter back to her baseline.  With episodes no she noted on chest pain , n/v/d/ dysuria fever/chills. Or shortness of breath. Of note patient has similar admission 7/18 and at that time evaluation was unrevealing and it was thought that these episodes were stress induced.   1/17.  When turning head to the left patient developed room spinning and dizziness.  Added standing dose meclizine and as needed Valium for vertigo.  When sitting up with physical therapy today, she developed dizziness decreased level of alertness and slurred speech.  Symptoms resolved after few minutes. 1/18.  When working with physical therapy and they sat her up she had shaking episode and became less responsive and some slurred speech.  We checked her blood pressure when this happened and it did not drop.  Patient had similar episode when I went back into the room to see her.  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 1/19.  A.m. cortisol very low at 1.7.  No recent fills of steroids.  Will start Cortef for adrenal insufficiency.  Add on ACTH. 1/20.  Patient  walked 4 feet with physical therapy today. 1/21.  Creatinine continues to creep up to 2.64.  D/C losartan.  Fluid bolus given. 1/22.  Eliquis held after morning dose secondary to being told that she has been having nosebleeds.  Hemoglobin drifted down to 8.2.  Creatinine 2.68.  IV iron given.  Walked 20 feet with physical therapy. 1/23.  Had another nosebleed overnight.  Eliquis still on hold.  This morning's hemoglobin 9.3 and creatinine 2.59.  Patient orthostatic with physical therapy today. 1/24 - renal function at plateau, remain on gentle fluids  Assessment and Plan: * Acute kidney injury superimposed on chronic kidney disease ([place]) AKI on CKD stage IV. Creatinine went up from 2.23 on admission to 2.68 on 1/22.  Today's creatinine 2.59>>2.60, stable but not improving.  Hold losartan and Lasix.  Fluid bolus given 1/23 and gentle IV fluids overnight. Monitor BMP. Continue IV fluids. Check renal U/S to rule out obstruction.  Adrenal insufficiency (HCC) A.m. cortisol very low at 1.7.  Continue Cortef 20 mg in the morning and 10 mg in the afternoon.  Patient doing better since starting Cortef.  Epistaxis Hemoglobin 9.3 today.  Eliquis has been held (was on 5 mg BID).   IV Venofer given on 1/22. Pt agreeable to resume low dose Eliquis while here.  Monitor closely. Risk/benefits of anticoagulation should continue to be weighed going forward.  Acute cystitis with hematuria Initially on Rocephin, transitioned to Keflex and completed course.  Urine culture grew E. coli.  Seizure-like activity ([place]) Psychogenic nonepileptic events.  Supportive care.  Patient declines psychiatry consultation.    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  Decubitus ulcer of left heel, unstageable ([place]) Appreciate wound care consultation.  Heel protectors while in bed.  Present on admission.  See full description below.  X-ray did not show any signs of osteomyelitis.  Iron deficiency  anemia Continue to monitor hemoglobin.  Last hemoglobin 9.3.  IV iron 1/22.  Hypokalemia Replaced.  Monitor BMP, replace K as needed.  History of DVT (deep vein thrombosis) Held Eliquis due to epistaxis and anemia. Resume low dose Eliquis today. Repeat U/S of the lower extremity does not show any DVT.    Hypothyroidism, unspecified Continue levothyroxine  Rheumatoid arthritis ([place]) Continue Plaquenil  History of CVA (cerebrovascular accident) CT scan does not show any acute infarcts.  Chronic systolic CHF (congestive heart failure) (HCC) Continue Coreg, last EF 40%.   Hold Lasix and losartan until renal fx improves.  Paroxysmal atrial fibrillation (HCC) Risk of stroke higher holding anticoagulation (due to epistaxis). No bleeding since off Eliquis. Pt agreeable to try lower dose Eliquis. Resume Eliquis at 2.5 mg BID and closely monitor for bleeding.  Essential hypertension Patient orthostatic on 1/23, given fluid bolus.  Continue Coreg, Imdur.  Discontinue Norvasc.  Uncontrolled type 2 diabetes mellitus with hypoglycemia, without long-term current use of insulin (HCC) Sliding scale insulin for now holding oral hypoglycemics.   Goals of care - full scope / full code Palliative Care to follow as outpatient after discharge     Subjective: Patient was awake when seen today. She reports feeling overall okay.  Doing better when up with therapy. Denies feeling dizzy at all yesterday when seated edge of bed for awhile.  Denies any acute complaints. Confirms plan to go home with [facility] and caregiver support, states she has plenty of help and since she's improving significantly, will have adequate support at home.    Physical Exam: Vitals:   01/19/23 1956 01/20/23 0500 01/20/23 0606 01/20/23 0828  BP: 136/67  (!) 146/74 (!) 141/81  Pulse: 75  76 77  Resp: '18  18 18  '$ Temp: 97.9 F (36.6 C)  97.7 F (36.5 C) 97.6 F (36.4 C)  TempSrc:   Oral Oral  SpO2: 99%  100% 97%  Weight:   87.6 kg    Height:       General exam:awake, alert, no acute distress, talkative HEENT  moist mucus membranes, hearing grossly normal  Respiratory system: CTAB diminished bases, no wheezes, rales or rhonchi, normal respiratory effort. Cardiovascular system: normal S1/S2, RRR   Gastrointestinal system: soft, NT, ND Central nervous system: A&O x 3. no gross focal neurologic deficits, normal speech Extremities: moves all, no edema, normal tone Skin: dry, intact, normal temperature Psychiatry: normal mood, congruent affect, judgement and insight appear normal     Data Reviewed:  Notable labs -- Cr 2.68 >> 2.59 >> 2.60, GFR 19, glucose 183, Na 134, bicarb 20 from 24, BUN 76, Ca 7.5, Mg 2.5.    Family Communication: None at bedside.  Will attempt to call sister this afternoon.   Disposition: Status is: Inpatient Remains inpatient appropriate because: Orthostatic today.  Creatinine still elevated.  Had a nosebleed last night.  Planned Discharge Destination: Home and Home with Home Health    Time spent: 36 minutes  Author: Ezekiel Slocumb, DO 01/20/2023 3:02 PM  For on call review [email or web address].

## 2023-01-20 NOTE — TOC Progression Note (Signed)
Transition of Care [facility]) - Progression Note    Patient Details  Name: Tracey Bryant MRN: [id number] Date of Birth: July 14, 1951  Transition of Care [facility]) CM/SW [place], LCSW Phone Number: 01/20/2023, 2:43 PM  Clinical Narrative:   Attending met with patient and confirmed plan continues to be to return home with home health when medically stable.  Expected Discharge Plan: [place] Barriers to Discharge: Continued Medical Work up  Expected Discharge Plan and Services     Post Acute Care Choice: Resumption of Svcs/PTA Provider Living arrangements for the past 2 months: Single Family Home                                       Social Determinants of Health (SDOH) Interventions SDOH Screenings   Food Insecurity: No Food Insecurity (01/12/2023)  Housing: Low Risk  (01/12/2023)  Transportation Needs: No Transportation Needs (01/12/2023)  Utilities: Not At Risk (01/12/2023)  Tobacco Use: Low Risk  (01/12/2023)    Readmission Risk Interventions    01/13/2023   11:57 AM 12/27/2021    2:32 PM 03/19/2021    9:34 AM  Readmission Risk Prevention Plan  Transportation Screening Complete Complete   PCP or Specialist Appt within 5-7 Days   Complete  PCP or Specialist Appt within 3-5 Days  Complete   Home Care Screening   Complete  [place] or [place]  Complete   Social Work Consult for [place] Planning/Counseling  Complete   Palliative Care Screening  Not Applicable   Medication Review [employer]) Complete Complete   PCP or Specialist appointment within 3-5 days of discharge Complete    HRI or [place] Complete    SW Recovery Care/Counseling Consult Complete    Palliative Care Screening Not [place] Patient Refused

## 2023-01-21 DIAGNOSIS — R040 Epistaxis: Secondary | ICD-10-CM | POA: Diagnosis not present

## 2023-01-21 DIAGNOSIS — Z789 Other specified health status: Secondary | ICD-10-CM | POA: Diagnosis present

## 2023-01-21 DIAGNOSIS — N179 Acute kidney failure, unspecified: Secondary | ICD-10-CM | POA: Diagnosis not present

## 2023-01-21 DIAGNOSIS — D509 Iron deficiency anemia, unspecified: Secondary | ICD-10-CM | POA: Diagnosis not present

## 2023-01-21 DIAGNOSIS — E2740 Unspecified adrenocortical insufficiency: Secondary | ICD-10-CM | POA: Diagnosis not present

## 2023-01-21 LAB — CBC
HCT: 22.7 % — ABNORMAL LOW (ref 36.0–46.0)
Hemoglobin: 7 g/dL — ABNORMAL LOW (ref 12.0–15.0)
MCH: 30 pg (ref 26.0–34.0)
MCHC: 30.8 g/dL (ref 30.0–36.0)
MCV: 97.4 fL (ref 80.0–100.0)
Platelets: 267 10*3/uL (ref 150–400)
RBC: 2.33 MIL/uL — ABNORMAL LOW (ref 3.87–5.11)
RDW: 15.3 % (ref 11.5–15.5)
WBC: 8.7 10*3/uL (ref 4.0–10.5)
nRBC: 0 % (ref 0.0–0.2)

## 2023-01-21 LAB — GLUCOSE, CAPILLARY
Glucose-Capillary: 114 mg/dL — ABNORMAL HIGH (ref 70–99)
Glucose-Capillary: 111 mg/dL — ABNORMAL HIGH (ref 70–99)
Glucose-Capillary: 191 mg/dL — ABNORMAL HIGH (ref 70–99)
Glucose-Capillary: 178 mg/dL — ABNORMAL HIGH (ref 70–99)

## 2023-01-21 LAB — IRON AND TIBC
Iron: 53 ug/dL (ref 28–170)
Saturation Ratios: 15 % (ref 10.4–31.8)
TIBC: 361 ug/dL (ref 250–450)
UIBC: 308 ug/dL

## 2023-01-21 LAB — RETICULOCYTES
Immature Retic Fract: 26.2 % — ABNORMAL HIGH (ref 2.3–15.9)
RBC.: 2.32 MIL/uL — ABNORMAL LOW (ref 3.87–5.11)
Retic Count, Absolute: 65 10*3/uL (ref 19.0–186.0)
Retic Ct Pct: 2.8 % (ref 0.4–3.1)

## 2023-01-21 LAB — BASIC METABOLIC PANEL
Anion gap: 6 (ref 5–15)
BUN: 72 mg/dL — ABNORMAL HIGH (ref 8–23)
CO2: 20 mmol/L — ABNORMAL LOW (ref 22–32)
Calcium: 7.6 mg/dL — ABNORMAL LOW (ref 8.9–10.3)
Chloride: 108 mmol/L (ref 98–111)
Creatinine, Ser: 2.48 mg/dL — ABNORMAL HIGH (ref 0.44–1.00)
GFR, Estimated: 20 mL/min — ABNORMAL LOW (ref >60)
Glucose, Bld: 152 mg/dL — ABNORMAL HIGH (ref 70–99)
Potassium: 4.8 mmol/L (ref 3.5–5.1)
Sodium: 134 mmol/L — ABNORMAL LOW (ref 135–145)

## 2023-01-21 LAB — VITAMIN B12: Vitamin B-12: 350 pg/mL (ref 180–914)

## 2023-01-21 LAB — HEMOGLOBIN AND HEMATOCRIT, BLOOD
HCT: 22.1 % — ABNORMAL LOW (ref 36.0–46.0)
Hemoglobin: 6.9 g/dL — ABNORMAL LOW (ref 12.0–15.0)

## 2023-01-21 LAB — FERRITIN: Ferritin: 238 ng/mL (ref 11–307)

## 2023-01-21 LAB — FOLATE: Folate: 6.3 ng/mL (ref >5.9)

## 2023-01-21 MED ORDER — SODIUM CHLORIDE 0.9 % IV SOLN
300.0000 mg | Freq: Once | INTRAVENOUS | Status: AC
  Administered 2023-01-21: 300 mg via INTRAVENOUS
  Filled 2023-01-21: qty 300

## 2023-01-21 NOTE — Progress Notes (Addendum)
Progress Note   Patient: Tracey Bryant TFT:[id number] DOB: [birth year] DOA: 01/11/2023     10 DOS: the patient was seen and examined on 01/21/2023   Brief hospital course: 72 y.o. female with medical history significant of  CHF pef , DMII, DVT, HTN , Pafib on Eliquis, CKDIIIb +PPM,h/o CVA x 3, hx of cholecystitis now with chronic biliary drain,history of recurrent syncope followed by neuro who presents to ED in referral from rheumatology clinic s/p recurrent episode of syncope. Patient states she had these episode for over a year and noted that these episodes are not frequent and occur every 4-6 months. She state she does get an aura prior to even and that during even she states she is partially aware but too weak to speak. She states that after even she feels severely fatigued for 5-10 mins and thereafter back to her baseline.  With episodes no she noted on chest pain , n/v/d/ dysuria fever/chills. Or shortness of breath. Of note patient has similar admission 7/18 and at that time evaluation was unrevealing and it was thought that these episodes were stress induced.   1/17.  When turning head to the left patient developed room spinning and dizziness.  Added standing dose meclizine and as needed Valium for vertigo.  When sitting up with physical therapy today, she developed dizziness decreased level of alertness and slurred speech.  Symptoms resolved after few minutes. 1/18.  When working with physical therapy and they sat her up she had shaking episode and became less responsive and some slurred speech.  We checked her blood pressure when this happened and it did not drop.  Patient had similar episode when I went back into the room to see her.  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 1/19.  A.m. cortisol very low at 1.7.  No recent fills of steroids.  Will start Cortef for adrenal insufficiency.  Add on ACTH. 1/20.  Patient  walked 4 feet with physical therapy today. 1/21.  Creatinine continues to creep up to 2.64.  D/C losartan.  Fluid bolus given. 1/22.  Eliquis held after morning dose secondary to being told that she has been having nosebleeds.  Hemoglobin drifted down to 8.2.  Creatinine 2.68.  IV iron given.  Walked 20 feet with physical therapy. 1/23.  Had another nosebleed overnight.  Eliquis still on hold.  This morning's hemoglobin 9.3 and creatinine 2.59.  Patient orthostatic with physical therapy today. 1/24 - renal function at plateau, remain on gentle fluids 1/25 - trial resume low dose Eliquis, renal function plateau'd 1/26 - had recurrent epistaxis, stop Eliquis. Hbg 7.0 >> 6.9, declines blood transfusion, gave IV iron  Assessment and Plan: * Acute kidney injury superimposed on chronic kidney disease ([place]) AKI on CKD stage IV. Creatinine went up from 2.23 on admission to 2.68 on 1/22.   Has been on gentle IV hydration with slight improvement Cr 2.60 >> 2.48 today. Fluid bolus given 1/23 and gentle IV fluids since then. Worsening edema and hx CHF. Will stop fluids & consider diuresis if volume status worsening Monitor BMP. Renal U/S with no obstruction. Hold losartan and Lasix.    Adrenal insufficiency (HCC) A.m. cortisol very low at 1.7.  Continue Cortef 20 mg in the morning and 10 mg in the afternoon.  Patient doing better since starting Cortef.  Epistaxis Eliquis was held (was on 5 mg BID) due to nose bleeds and Hbg trended down.  IV Venofer given on 1/22.  Hbg improved and stabilized. Pt agreeable to resume low dose Eliquis while here. This was started 1/25 PM. She had recurrent epistaxis and worsened anemia again on 1/26. Stop Eliquis.  Acute cystitis with hematuria Initially on Rocephin, transitioned to Keflex and completed course.  Urine culture grew E. coli.  Seizure-like activity ([place]) Psychogenic nonepileptic events.  Supportive care.  Patient declines psychiatry consultation.     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  No blood products Patient is Jehovah's witness and declines use of any blood products.  Getting IV iron for anemia.  Decubitus ulcer of left heel, unstageable ([place]) Appreciate wound care consultation.  Heel protectors while in bed.  Present on admission.  See full description below.  X-ray did not show any signs of osteomyelitis.  Iron deficiency anemia Continue to monitor hemoglobin.   Given IV iron 1/22 and again today 1/26. Hbg today 7.0 >> 6.9. Pt declines RBC transfusion as she is Jehovah's witness. Stopped Eliquis after re-bleed on low dose.  Hypokalemia Replaced.  Monitor BMP, replace K as needed.  History of DVT (deep vein thrombosis) Held Eliquis due to epistaxis and anemia. Resume low dose Eliquis today. Repeat U/S of the lower extremity does not show any DVT.    Hypothyroidism, unspecified Continue levothyroxine  Rheumatoid arthritis ([place]) Continue Plaquenil  History of CVA (cerebrovascular accident) CT scan does not show any acute infarcts.  Chronic systolic CHF (congestive heart failure) (HCC) Continue Coreg, last EF 40%.   Hold Lasix and losartan until renal fx improves. 1/26: worsening edema, stop gentle fluids and will consider diuresis tomorrow if needed  Paroxysmal atrial fibrillation (HCC) Risk of stroke higher holding anticoagulation (due to epistaxis). No bleeding since off Eliquis. Pt agreeable to try lower dose Eliquis, but had recurrent bleeding.  Stopped Eliquis.  Essential hypertension Patient orthostatic on 1/23, given fluid bolus.  Continue Coreg, Imdur.  Discontinue Norvasc.  Uncontrolled type 2 diabetes mellitus with hypoglycemia, without long-term current use of insulin (HCC) Sliding scale insulin for now holding oral hypoglycemics.   Goals of care - full scope / full code Palliative Care to follow as outpatient after discharge     Subjective: Patient was awake when  seen today. She reports feeling overall well. She got a brief nose bleed this AM but it stopped quickly, was not nearly as severe as previous episodes.  She reports having BM's that are loose, and BM this AM she was not aware she'd had.  She declines blood transfusion if Hbg falls below 7, she is Jehovah's witness.  Agreeable to IV iron.   Physical Exam: Vitals:   01/20/23 1954 01/21/23 0450 01/21/23 0500 01/21/23 0814  BP: 136/66 (!) 144/79  (!) 143/77  Pulse: 74 76  76  Resp: '20 17  17  '$ Temp: 98 F (36.7 C) 98.6 F (37 C)  97.7 F (36.5 C)  TempSrc: Oral Oral    SpO2: 99% 99%  99%  Weight:   87.9 kg   Height:       General exam:awake, alert, no acute distress, talkative HEENT  moist mucus membranes, hearing grossly normal  Respiratory system: CTAB diminished bases, no wheezes, rales or rhonchi, normal respiratory effort. Cardiovascular system: normal S1/S2, RRR, 2+ b/l LE edema Gastrointestinal system: soft, NT, ND Central nervous system: A&O x 3. no gross focal neurologic deficits, normal speech Extremities: moves all, normal tone, offloading boots on b/l feet, edema as above Skin: dry, intact, normal temperature Psychiatry: normal  mood, congruent affect, judgement and insight appear normal     Data Reviewed:  Notable labs -- Cr 2.68 >> 2.59 >> 2.60 >> 2.48, GFR 20, glucose 152, Na 134, bicarb 20, BUN 72, Ca 7.6 CBC with Hbg 7.0 (previously 8.9 on 1/21), repeat this afternoon 6.9. Anemia panel normal iron studies and folate.  Elevated immature retics.  Family Communication: Updated sister by phone this afternoon.   Disposition: Status is: Inpatient Remains inpatient appropriate because: Orthostatic today.  Creatinine still elevated.  Had a nosebleed last night.  Planned Discharge Destination: Home and Home with Home Health    Time spent: 36 minutes  Author: Ezekiel Slocumb, DO 01/21/2023 3:02 PM  For on call review [email or web address].

## 2023-01-21 NOTE — Progress Notes (Signed)
Pt's left arm is swollen from infiltration of IV to left wrist. Nurse elevated extremity and placed ice pack. New IV placed to right hand, tolerated well.

## 2023-01-21 NOTE — Assessment & Plan Note (Signed)
Patient is Jehovah's witness and declines use of any blood products.  Given IV iron twice this admission for anemia.

## 2023-01-21 NOTE — Progress Notes (Signed)
PT Cancellation Note  Patient Details Name: Tracey Bryant MRN: [id number] DOB: [birth year]   Cancelled Treatment:    Reason Eval/Treat Not Completed: Medical issues which prohibited therapy. Patient Hgb 6.9 this date. Will continue to follow.    July Linam 01/21/2023, 2:09 PM

## 2023-01-22 DIAGNOSIS — I5022 Chronic systolic (congestive) heart failure: Secondary | ICD-10-CM | POA: Diagnosis not present

## 2023-01-22 DIAGNOSIS — R040 Epistaxis: Secondary | ICD-10-CM | POA: Diagnosis not present

## 2023-01-22 DIAGNOSIS — N179 Acute kidney failure, unspecified: Secondary | ICD-10-CM | POA: Diagnosis not present

## 2023-01-22 DIAGNOSIS — D509 Iron deficiency anemia, unspecified: Secondary | ICD-10-CM | POA: Diagnosis not present

## 2023-01-22 LAB — BASIC METABOLIC PANEL
Anion gap: 5 (ref 5–15)
BUN: 72 mg/dL — ABNORMAL HIGH (ref 8–23)
CO2: 22 mmol/L (ref 22–32)
Calcium: 7.8 mg/dL — ABNORMAL LOW (ref 8.9–10.3)
Chloride: 108 mmol/L (ref 98–111)
Creatinine, Ser: 2.4 mg/dL — ABNORMAL HIGH (ref 0.44–1.00)
GFR, Estimated: 21 mL/min — ABNORMAL LOW (ref >60)
Glucose, Bld: 105 mg/dL — ABNORMAL HIGH (ref 70–99)
Potassium: 5.1 mmol/L (ref 3.5–5.1)
Sodium: 135 mmol/L (ref 135–145)

## 2023-01-22 LAB — GLUCOSE, CAPILLARY
Glucose-Capillary: 86 mg/dL (ref 70–99)
Glucose-Capillary: 132 mg/dL — ABNORMAL HIGH (ref 70–99)
Glucose-Capillary: 144 mg/dL — ABNORMAL HIGH (ref 70–99)
Glucose-Capillary: 214 mg/dL — ABNORMAL HIGH (ref 70–99)
Glucose-Capillary: 210 mg/dL — ABNORMAL HIGH (ref 70–99)

## 2023-01-22 LAB — CBC
HCT: 22.7 % — ABNORMAL LOW (ref 36.0–46.0)
Hemoglobin: 7.2 g/dL — ABNORMAL LOW (ref 12.0–15.0)
MCH: 30.8 pg (ref 26.0–34.0)
MCHC: 31.7 g/dL (ref 30.0–36.0)
MCV: 97 fL (ref 80.0–100.0)
Platelets: 276 10*3/uL (ref 150–400)
RBC: 2.34 MIL/uL — ABNORMAL LOW (ref 3.87–5.11)
RDW: 15.6 % — ABNORMAL HIGH (ref 11.5–15.5)
WBC: 8.3 10*3/uL (ref 4.0–10.5)
nRBC: 0 % (ref 0.0–0.2)

## 2023-01-22 LAB — HEMOGLOBIN AND HEMATOCRIT, BLOOD
HCT: 24.1 % — ABNORMAL LOW (ref 36.0–46.0)
Hemoglobin: 7.5 g/dL — ABNORMAL LOW (ref 12.0–15.0)

## 2023-01-22 NOTE — Progress Notes (Signed)
Progress Note   Patient: Tracey Bryant HLK:[id number] DOB: 1951/05/04 DOA: 01/11/2023     11 DOS: the patient was seen and examined on 01/22/2023   Brief hospital course: 72 y.o. female with medical history significant of  CHF pef , DMII, DVT, HTN , Pafib on Eliquis, CKDIIIb +PPM,h/o CVA x 3, hx of cholecystitis now with chronic biliary drain,history of recurrent syncope followed by neuro who presents to ED in referral from rheumatology clinic s/p recurrent episode of syncope. Patient states she had these episode for over a year and noted that these episodes are not frequent and occur every 4-6 months. She state she does get an aura prior to even and that during even she states she is partially aware but too weak to speak. She states that after even she feels severely fatigued for 5-10 mins and thereafter back to her baseline.  With episodes no she noted on chest pain , n/v/d/ dysuria fever/chills. Or shortness of breath. Of note patient has similar admission 7/18 and at that time evaluation was unrevealing and it was thought that these episodes were stress induced.   1/17.  When turning head to the left patient developed room spinning and dizziness.  Added standing dose meclizine and as needed Valium for vertigo.  When sitting up with physical therapy today, she developed dizziness decreased level of alertness and slurred speech.  Symptoms resolved after few minutes. 1/18.  When working with physical therapy and they sat her up she had shaking episode and became less responsive and some slurred speech.  We checked her blood pressure when this happened and it did not drop.  Patient had similar episode when I went back into the room to see her.  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 1/19.  A.m. cortisol very low at 1.7.  No recent fills of steroids.  Will start Cortef for adrenal insufficiency.  Add on ACTH. 1/20.  Patient  walked 4 feet with physical therapy today. 1/21.  Creatinine continues to creep up to 2.64.  D/C losartan.  Fluid bolus given. 1/22.  Eliquis held after morning dose secondary to being told that she has been having nosebleeds.  Hemoglobin drifted down to 8.2.  Creatinine 2.68.  IV iron given.  Walked 20 feet with physical therapy. 1/23.  Had another nosebleed overnight.  Eliquis still on hold.  This morning's hemoglobin 9.3 and creatinine 2.59.  Patient orthostatic with physical therapy today. 1/24 - renal function at plateau, remain on gentle fluids 1/25 - trial resume low dose Eliquis, renal function plateau'd 1/26 - had recurrent epistaxis, stop Eliquis. Hbg 7.0 >> 6.9, declines blood transfusion, gave IV iron  Assessment and Plan: * Acute kidney injury superimposed on chronic kidney disease ([place]) AKI on CKD stage IV. Creatinine went up from 2.23 on admission to 2.68 on 1/22.   Has been on gentle IV hydration with slight improvement Cr 2.60 >> 2.48  >> 2.40 today. Fluid bolus given 1/23 and gentle IV fluids since then, stopped 1/26. Cr improved off the fluids. Monitor BMP. Renal U/S with no obstruction. Hold losartan and Lasix.    Adrenal insufficiency (HCC) A.m. cortisol very low at 1.7.  Continue Cortef 20 mg in the morning and 10 mg in the afternoon.  Patient doing better since starting Cortef.  Epistaxis Eliquis was held (was on 5 mg BID) due to nose bleeds and Hbg trended down.   IV Venofer given  on 1/22.  Hbg improved and stabilized. Pt agreeable to resume low dose Eliquis while here. This was started 1/25 PM. She had recurrent epistaxis and worsened anemia again on 1/26. Stop Eliquis.  Acute cystitis with hematuria Initially on Rocephin, transitioned to Keflex and completed course.  Urine culture grew E. coli.  Seizure-like activity ([place]) Psychogenic nonepileptic events.  Supportive care.  Patient declines psychiatry consultation.    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  No blood products Patient is Jehovah's witness and declines use of any blood products.  Given IV iron twice this admission for anemia.  Decubitus ulcer of left heel, unstageable ([place]) Appreciate wound care consultation.  Heel protectors while in bed.  Present on admission.  See full description below.  X-ray did not show any signs of osteomyelitis.  Iron deficiency anemia Continue to monitor hemoglobin.   Given IV iron 1/22 and again today 1/26. Hbg improved today 7.0 >> 6.9 >> 7.2. Pt declines RBC transfusion as she is Jehovah's witness. Stopped Eliquis after re-bleed on low dose.  Hypokalemia Replaced.  Monitor BMP, replace K as needed.  History of DVT (deep vein thrombosis) Held Eliquis due to epistaxis and anemia. Did a trial on low dose Eliquis but she had recurrent epistaxis. Discontinue Eliquis. Repeat U/S of the lower extremity does not show any DVT.    Hypothyroidism, unspecified Continue levothyroxine  Rheumatoid arthritis ([place]) Continue Plaquenil  History of CVA (cerebrovascular accident) CT scan does not show any acute infarcts.  Chronic systolic CHF (congestive heart failure) (HCC) Continue Coreg, last EF 40%.   Hold Lasix and losartan until renal fx improves. 1/26: worsening edema, stop gentle fluids and will consider diuresis tomorrow if needed 1/17: edema slightly better off fluids  Paroxysmal atrial fibrillation (HCC) Risk of stroke higher holding anticoagulation (due to epistaxis). No bleeding since off Eliquis. Pt agreeable to try lower dose Eliquis, but had recurrent bleeding.  Stopped Eliquis.  Essential hypertension Patient orthostatic on 1/23, given fluid bolus.  Continue Coreg, Imdur.  Discontinue Norvasc.  Uncontrolled type 2 diabetes mellitus with hypoglycemia, without long-term current use of insulin (HCC) Sliding scale insulin for now holding oral hypoglycemics.   Goals of care - full scope / full code Palliative  Care to follow as outpatient after discharge     Subjective: Patient was awake when seen today. She reports feeling well. No further nose bleeds.  PT came to work with her earlier this AM and she was still quite tired, felt more weak than past couple days.  No other acute complaints. Hopes to return home tomorrow if Hbg is better.    Physical Exam: Vitals:   01/22/23 0456 01/22/23 0459 01/22/23 0500 01/22/23 0834  BP: (!) 161/87 133/82  (!) 152/84  Pulse: 76 93  81  Resp: 16   18  Temp: 97.9 F (36.6 C)   97.9 F (36.6 C)  TempSrc:    Oral  SpO2: 99%   99%  Weight:   89.1 kg   Height:       General exam:awake, alert, no acute distress, talkative HEENT  moist mucus membranes, hearing grossly normal  Respiratory system: CTAB diminished bases, no wheezes, rales or rhonchi, normal respiratory effort. Cardiovascular system: normal S1/S2, RRR, slightly improved b/l LE edema Gastrointestinal system: soft, NT, ND Central nervous system: A&O x 3. no gross focal neurologic deficits, normal speech Extremities: moves all, normal tone, offloading boots on b/l feet, edema as above Skin: dry, intact, normal temperature Psychiatry: normal  mood, congruent affect, judgement and insight appear normal     Data Reviewed:  Notable labs -- Cr 2.68 >> 2.59 >> 2.60 >> 2.48>>2.40, glucose 105, Ca 7.8, GFR 21 improved.   Hbg 6.9 >> 7.2 this AM  1/26 Anemia panel normal iron studies and folate.  Elevated immature retics.  Family Communication: Updated sister by phone afternoon 1/26  Disposition: Status is: Inpatient Remains inpatient appropriate because: requires close monitoring and improvement in anemia.  Anticipate discharge tomorrow if stable or improved  Dispo: home with [facility] and caregiver support     Time spent: 35 minutes  Author: Ezekiel Slocumb, DO 01/22/2023 1:23 PM  For on call review [email or web address].

## 2023-01-22 NOTE — Progress Notes (Signed)
Physical Therapy Treatment Patient Details Name: Tracey Bryant MRN: [id number] DOB: Jan 29, 1951 Today's Date: 01/22/2023   History of Present Illness Tracey Bryant is a 72 y/o female presented to ED on 01/11/23 for recurrent syncope. MD assessement includes: Acute kidney injury superimposed on chronic kidney disease, adrenal insufficiency,  psychogenic nonepileptic events, decubitus ulcer of left heel, unstageable, iron deficiency anemia, and hypokalemia.  PMH includes arthritis, CHF, DM II, DVT, HTN, afib, and stroke. hx of cholecystitis now with chronic biliary drain.    PT Comments    Due to pt's fatigue level and low HgB (7.2), no OOB therapy but focus of PT treatment was LE and core strengthening with supine exercises.  Pt tolerated well and was challenged reporting increased fatigue with ther.ex .  Currently pt is not progressing towards mobility goals (due to medical issues).  Follow up recommendations may need to change depending on pt's status.  Recommendations for follow up therapy are one component of a multi-disciplinary discharge planning process, led by the attending physician.  Recommendations may be updated based on patient status, additional functional criteria and insurance authorization.  Follow Up Recommendations  Home health PT Can patient physically be transported by private vehicle: No   Assistance Recommended at Discharge Intermittent Supervision/Assistance  Patient can return home with the following Two people to help with walking and/or transfers   Equipment Recommendations  None recommended by PT    Recommendations for Other Services       Precautions / Restrictions Precautions Precautions: Fall Precaution Comments: patient reports left foot pain with mobility ( not new) Restrictions Weight Bearing Restrictions: No     Mobility  Bed Mobility               General bed mobility comments: Scooting up in Bed +2 to slide pt    Transfers                    General transfer comment: No OOB therapy due to pt's fatigue and low HgB level 7.2    Ambulation/Gait               General Gait Details: No OOB therapy due to pt's fatigue and low HgB level 7.2   Stairs             Wheelchair Mobility    Modified Rankin (Stroke Patients Only)       Balance       Sitting balance - Comments: Not tested due to pt's fatigue and low HgB level 7.2       Standing balance comment: Not tested due to pt's fatigue and low HgB level 7.2                            Cognition Arousal/Alertness: Awake/alert Behavior During Therapy: WFL for tasks assessed/performed Overall Cognitive Status: Within Functional Limits for tasks assessed                                 General Comments: follows cues well        Exercises Total Joint Exercises Quad Sets: Strengthening, Both, 10 reps Gluteal Sets: Strengthening, Both, 10 reps Towel Squeeze: Strengthening, Both, 10 reps Heel Slides: AAROM, Strengthening, Both, 10 reps, Left Other Exercises Other Exercises: supine chin tucks to work on abdominal activation 2x5.    General Comments  Pertinent Vitals/Pain Pain Assessment Pain Assessment: 0-10 Pain Score: 5  Faces Pain Scale: Hurts little more Pain Location: L foot Pain Descriptors / Indicators: Discomfort, Sore Pain Intervention(s): Monitored during session    Home Living                          Prior Function            PT Goals (current goals can now be found in the care plan section) Acute Rehab PT Goals Patient Stated Goal: to be able to go home PT Goal Formulation: With patient Time For Goal Achievement: 01/26/23 Potential to Achieve Goals: Good Progress towards PT goals: Not progressing toward goals - comment (not progressing at this time; due to medical issues?)    Frequency    Min 2X/week      PT Plan Discharge plan needs to be updated     Co-evaluation              AM-PAC PT "6 Clicks" Mobility   Outcome Measure  Help needed turning from your back to your side while in a flat bed without using bedrails?: A Little Help needed moving from lying on your back to sitting on the side of a flat bed without using bedrails?: A Lot Help needed moving to and from a bed to a chair (including a wheelchair)?: A Lot Help needed standing up from a chair using your arms (e.g., wheelchair or bedside chair)?: A Lot Help needed to walk in hospital room?: A Lot Help needed climbing 3-5 steps with a railing? : Total 6 Click Score: 12    End of Session   Activity Tolerance: Treatment limited secondary to medical complications (Comment) Patient left: with call bell/phone within reach;in bed;with bed alarm set Nurse Communication: Mobility status PT Visit Diagnosis: Other abnormalities of gait and mobility (R26.89);Muscle weakness (generalized) (M62.81);Difficulty in walking, not elsewhere classified (R26.2)     Time: 1020-1047 PT Time Calculation (min) (ACUTE ONLY): 27 min  Charges:  $Therapeutic Exercise: 23-37 mins                     Bjorn Loser, PTA  01/22/23, 11:14 AM

## 2023-01-23 DIAGNOSIS — N189 Chronic kidney disease, unspecified: Secondary | ICD-10-CM | POA: Diagnosis not present

## 2023-01-23 DIAGNOSIS — N179 Acute kidney failure, unspecified: Secondary | ICD-10-CM | POA: Diagnosis not present

## 2023-01-23 LAB — CBC
HCT: 22.2 % — ABNORMAL LOW (ref 36.0–46.0)
Hemoglobin: 7.1 g/dL — ABNORMAL LOW (ref 12.0–15.0)
MCH: 31.4 pg (ref 26.0–34.0)
MCHC: 32 g/dL (ref 30.0–36.0)
MCV: 98.2 fL (ref 80.0–100.0)
Platelets: 264 10*3/uL (ref 150–400)
RBC: 2.26 MIL/uL — ABNORMAL LOW (ref 3.87–5.11)
RDW: 15.8 % — ABNORMAL HIGH (ref 11.5–15.5)
WBC: 7 10*3/uL (ref 4.0–10.5)
nRBC: 0 % (ref 0.0–0.2)

## 2023-01-23 LAB — BASIC METABOLIC PANEL
Anion gap: 6 (ref 5–15)
BUN: 71 mg/dL — ABNORMAL HIGH (ref 8–23)
CO2: 20 mmol/L — ABNORMAL LOW (ref 22–32)
Calcium: 7.9 mg/dL — ABNORMAL LOW (ref 8.9–10.3)
Chloride: 109 mmol/L (ref 98–111)
Creatinine, Ser: 2.3 mg/dL — ABNORMAL HIGH (ref 0.44–1.00)
GFR, Estimated: 22 mL/min — ABNORMAL LOW (ref >60)
Glucose, Bld: 68 mg/dL — ABNORMAL LOW (ref 70–99)
Potassium: 4.8 mmol/L (ref 3.5–5.1)
Sodium: 135 mmol/L (ref 135–145)

## 2023-01-23 LAB — GLUCOSE, CAPILLARY
Glucose-Capillary: 90 mg/dL (ref 70–99)
Glucose-Capillary: 135 mg/dL — ABNORMAL HIGH (ref 70–99)
Glucose-Capillary: 122 mg/dL — ABNORMAL HIGH (ref 70–99)

## 2023-01-23 LAB — T4, FREE: Free T4: 0.93 ng/dL (ref 0.61–1.12)

## 2023-01-23 LAB — HEMOGLOBIN AND HEMATOCRIT, BLOOD
HCT: 24.1 % — ABNORMAL LOW (ref 36.0–46.0)
Hemoglobin: 7.5 g/dL — ABNORMAL LOW (ref 12.0–15.0)

## 2023-01-23 LAB — TSH: TSH: 5.202 u[IU]/mL — ABNORMAL HIGH (ref 0.350–4.500)

## 2023-01-23 MED ORDER — HYDROCORTISONE 10 MG PO TABS: ORAL_TABLET | ORAL | 1 refills | Status: DC

## 2023-01-23 MED ORDER — MEDIHONEY WOUND/BURN DRESSING EX PSTE: 1.0000 | PASTE | Freq: Every day | CUTANEOUS | 1 refills | Status: DC

## 2023-01-23 MED ORDER — LEVOTHYROXINE SODIUM 25 MCG PO TABS: 25.0000 ug | ORAL_TABLET | Freq: Every day | ORAL | 1 refills | Status: DC

## 2023-01-23 MED ORDER — HYDROCORTISONE 10 MG PO TABS: ORAL_TABLET | ORAL | 1 refills | Status: AC

## 2023-01-23 MED ORDER — MEDIHONEY WOUND/BURN DRESSING EX PSTE: 1.0000 | PASTE | Freq: Every day | CUTANEOUS | 1 refills | Status: AC

## 2023-01-23 MED ORDER — LEVOTHYROXINE SODIUM 25 MCG PO TABS: 25.0000 ug | ORAL_TABLET | Freq: Every day | ORAL | 1 refills | Status: AC

## 2023-01-23 NOTE — Progress Notes (Signed)
Mobility Specialist - Progress Note   01/23/23 0900  Mobility  Activity Ambulated with assistance in room;Transferred to/from [facility]  Level of Assistance Contact guard assist, steadying assist  Assistive Device Front wheel walker  Distance Ambulated (ft) 5 ft  Activity Response Tolerated well  Mobility Referral Yes  $Mobility charge 1 Mobility   Pt semi-supine in bed on RA upon arrival. Pt completes bed mobility indep with extra time. Pt STS and transfers from bed to [facility] CGA with no LOB noted. Pt left on [facility] with education on how to call for assistance.   Gretchen Short  Mobility Specialist  01/23/23 9:02 AM

## 2023-01-23 NOTE — Progress Notes (Signed)
Mobility Specialist - Progress Note   01/23/23 1340  Mobility  Activity Transferred from chair to bed  Level of Assistance Standby assist, set-up cues, supervision of patient - no hands on  Assistive Device Front wheel walker  Distance Ambulated (ft) 5 ft  Activity Response Tolerated well  Mobility Referral Yes  $Mobility charge 1 Mobility   Pt sitting in recliner on RA upon arrival. Pt STS and ambulates from recliner to bed SBA with no LOB noted. Pt left in bed with needs in reach and bed alarm set.   Gretchen Short  Mobility Specialist  01/23/23 1:41 PM

## 2023-01-23 NOTE — Progress Notes (Signed)
Mobility Specialist - Progress Note     01/23/23 0902  Mobility  Activity Transferred to/from BSC;Repositioned in chair;Dangled on edge of bed;Ambulated with assistance in hallway  Level of Assistance Contact guard assist, steadying assist  Assistive Device Front wheel walker  Distance Ambulated (ft) 5 ft  Activity Response Tolerated well  Mobility Referral Yes  $Mobility charge 1 Mobility   Pt on BSC upon entry on RA. Pt STS MinA and ambulates (3 step pivot turn) to recliner CGA. Pt left with needs in reach and chair alarm activated.   Loma Sender Mobility Specialist 01/23/23, 9:06 AM

## 2023-01-23 NOTE — Discharge Summary (Signed)
Physician Discharge Summary   Patient: Tracey Bryant MRN: [id number] DOB: [birth year]  Admit date:     01/11/2023  Discharge date: 01/24/23  Discharge Physician: Ezekiel Slocumb   PCP: Coolidge Breeze, FNP   Recommendations at discharge:   Follow up with Primary Care in 1-2 weeks Repeat BMP, Mg, CBC within 1 week Monitor anemia closely.  Pt had bleeding on Eliquis even when tried on 2.5 mg. Appears she does not tolerate anticoagulation at this time.  Continue revisiting risks/benefits of anticoagulation going forward Repeat TSH, free T4 in about 6-8 weeks. Consider referral to Endocrinology for adrenal insufficiency, started on Cortef this admission. Follow up with Nephrology as scheduled Continue offloading pressure from heels, diligent wound care and close monitoring of wounds  Discharge Diagnoses: Principal Problem:   Acute kidney injury superimposed on chronic kidney disease ([place]) Active Problems:   Adrenal insufficiency ([place])   Epistaxis   Seizure-like activity ([place])   Acute cystitis with hematuria   Cholecystitis, chronic   Uncontrolled type 2 diabetes mellitus with hypoglycemia, without long-term current use of insulin (HCC)   Essential hypertension   Paroxysmal atrial fibrillation (HCC)   Chronic systolic CHF (congestive heart failure) (HCC)   History of CVA (cerebrovascular accident)   Rheumatoid arthritis ([place])   Hypothyroidism, unspecified   History of DVT (deep vein thrombosis)   Hypokalemia   Iron deficiency anemia   Decubitus ulcer of left heel, unstageable ([place])   No blood products  Resolved Problems:   * No resolved hospital problems. *  Hospital Course: 72 y.o. female with medical history significant of  CHF pef , DMII, DVT, HTN , Pafib on Eliquis, CKDIIIb +PPM,h/o CVA x 3, hx of cholecystitis now with chronic biliary drain,history of recurrent syncope followed by neuro who presents to ED in referral from rheumatology clinic s/p recurrent episode of  syncope. Patient states she had these episode for over a year and noted that these episodes are not frequent and occur every 4-6 months. She state she does get an aura prior to even and that during even she states she is partially aware but too weak to speak. She states that after even she feels severely fatigued for 5-10 mins and thereafter back to her baseline.  With episodes no she noted on chest pain , n/v/d/ dysuria fever/chills. Or shortness of breath. Of note patient has similar admission 7/18 and at that time evaluation was unrevealing and it was thought that these episodes were stress induced.   1/17.  When turning head to the left patient developed room spinning and dizziness.  Added standing dose meclizine and as needed Valium for vertigo.  When sitting up with physical therapy today, she developed dizziness decreased level of alertness and slurred speech.  Symptoms resolved after few minutes. 1/18.  When working with physical therapy and they sat her up she had shaking episode and became less responsive and some slurred speech.  We checked her blood pressure when this happened and it did not drop.  Patient had similar episode when I went back into the room to see her.  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 1/19.  A.m. cortisol very low at 1.7.  No recent fills of steroids.  Will start Cortef for adrenal insufficiency.  Add on ACTH. 1/20.  Patient walked 4 feet with physical therapy today. 1/21.  Creatinine continues to creep up to 2.64.  D/C losartan.  Fluid  bolus given. 1/22.  Eliquis held after morning dose secondary to being told that she has been having nosebleeds.  Hemoglobin drifted down to 8.2.  Creatinine 2.68.  IV iron given.  Walked 20 feet with physical therapy. 1/23.  Had another nosebleed overnight.  Eliquis still on hold.  This morning's hemoglobin 9.3 and creatinine 2.59.  Patient orthostatic with  physical therapy today. 1/24 - renal function at plateau, remain on gentle fluids 1/25 - trial resume low dose Eliquis, renal function plateau'd 1/26 - had recurrent epistaxis, stop Eliquis. Hbg 7.0 >> 6.9, declines blood transfusion, gave IV iron 1/27>>28 - Hbg stable and improved since off Eliquis.  Renal function further improved since off fluids, with Lasix held.  Medically stable for discharge home with home health and family / caregivers support.    Assessment and Plan: * Acute kidney injury superimposed on chronic kidney disease ([place]) AKI on CKD stage IV. Creatinine went up from 2.23 on admission to 2.68 on 1/22.   Has been on gentle IV hydration with slight improvement Cr 2.60 >> 2.48  >> 2.40 today. Fluid bolus given 1/23 and gentle IV fluids since then, stopped 1/26. Cr improved off the fluids. Monitor BMP. Renal U/S with no obstruction. Resume Lasix at discharge.    Adrenal insufficiency (HCC) A.m. cortisol very low at 1.7.  Continue Cortef 20 mg in the morning and 10 mg in the afternoon.  Patient doing better since starting Cortef.  Epistaxis Eliquis was held (was on 5 mg BID) due to nose bleeds and Hbg trended down.   IV Venofer given on 1/22.  Hbg improved and stabilized. Pt agreeable to resume low dose Eliquis while here. This was started 1/25 PM. She had recurrent epistaxis and worsened anemia again on 1/26. Stop Eliquis. No further episodes.  Acute cystitis with hematuria Initially on Rocephin, transitioned to Keflex and completed course.  Urine culture grew E. coli.  Seizure-like activity ([place]) Psychogenic nonepileptic events.  Supportive care.  Patient declines psychiatry consultation.    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  No blood products Patient is Jehovah's witness and declines use of any blood products.  Given IV iron twice this admission for anemia.  Decubitus ulcer of left heel, unstageable ([place]) Appreciate wound care  consultation.  Heel protectors while in bed.  Present on admission.  See full description below.  X-ray did not show any signs of osteomyelitis.  Iron deficiency anemia Continue to monitor hemoglobin.   Given IV iron 1/22 and again today 1/26. Hbg improved today 7.0 >> 6.9 >> 7.2. Pt declines RBC transfusion as she is Jehovah's witness. Stopped Eliquis after re-bleed on low dose. Hbg today improved to 7.5 Repeat CBC within 1 week  Hypokalemia Replaced.  Monitor BMP, replace K as needed.  History of DVT (deep vein thrombosis) Held Eliquis due to epistaxis and anemia. Did a trial on low dose Eliquis but she had recurrent epistaxis. Discontinue Eliquis. Repeat U/S of the lower extremity does not show any DVT.    Hypothyroidism, unspecified Continue levothyroxine  Rheumatoid arthritis (HCC) Continue Plaquenil, sulfasalazine. Rheumatology follow up as scheduled.  History of CVA (cerebrovascular accident) CT scan does not show any acute infarcts.  Chronic systolic CHF (congestive heart failure) (HCC) Continue Coreg, last EF 40%.   Hold Lasix until renal fx improves. 1/26: worsening edema, stop gentle fluids and will consider diuresis tomorrow if needed 1/27-28: edema better off fluids Resume Lasix at d/c (tomorrow)  Paroxysmal atrial fibrillation (HCC) Risk of stroke  higher holding anticoagulation (due to epistaxis). No bleeding since off Eliquis. Pt agreeable to try lower dose Eliquis, but had recurrent bleeding.  Stopped Eliquis.  Essential hypertension Patient orthostatic on 1/23, given fluid bolus.  Continue Coreg, Imdur.  Discontinue Norvasc.  Uncontrolled type 2 diabetes mellitus with hypoglycemia, without long-term current use of insulin (HCC) Sliding scale insulin during admission Resume glipizide at d/c         Consultants: None Procedures performed: None  Disposition: Home health Diet recommendation:  Cardiac and Carb modified diet DISCHARGE  MEDICATION: Allergies as of 01/23/2023   No Known Allergies      Medication List     STOP taking these medications    amLODipine 10 MG tablet Commonly known as: NORVASC   apixaban 5 MG Tabs tablet Commonly known as: ELIQUIS       TAKE these medications    carvedilol 3.125 MG tablet Commonly known as: COREG Take 1 tablet (3.125 mg total) by mouth 2 (two) times daily with a meal.   divalproex 500 MG DR tablet Commonly known as: DEPAKOTE Take 500 mg by mouth 2 (two) times daily.   docusate sodium 100 MG capsule Commonly known as: COLACE Take 100 mg by mouth daily as needed for mild constipation.   furosemide 20 MG tablet Commonly known as: LASIX Take 20 mg by mouth daily.   glipiZIDE 5 MG 24 hr tablet Commonly known as: GLUCOTROL XL Take 5 mg by mouth daily.   hydrocortisone 10 MG tablet Commonly known as: Cortef Take 2 tablets (20 mg) by mouth every morning 8am.  Take 1 tablet (10 mg) by mouth every afternoon 3pm.   hydroxychloroquine 200 MG tablet Commonly known as: PLAQUENIL Take 200 mg by mouth daily.   isosorbide mononitrate 30 MG 24 hr tablet Commonly known as: IMDUR Take 15 mg by mouth daily.   leptospermum manuka honey Pste paste Apply 1 Application topically daily. To Heel Wound/s   levothyroxine 25 MCG tablet Commonly known as: SYNTHROID Take 1 tablet (25 mcg total) by mouth daily at 6 (six) AM.   oxyCODONE 5 MG immediate release tablet Commonly known as: Roxicodone Take 1 tablet (5 mg total) by mouth every 6 (six) hours as needed for up to 15 doses for severe pain.   QUEtiapine 25 MG tablet Commonly known as: SEROQUEL Take 25 mg by mouth at bedtime.   sulfaSALAzine 500 MG EC tablet Commonly known as: AZULFIDINE Take 500 mg by mouth as directed. Take 500 mg daily for 14 days and then 500 mg bid               Discharge Care Instructions  (From admission, onward)           Start     Ordered   01/23/23 0000  Discharge wound  care:       Comments: Apply Medihoney to left heel wound Q day, then cover with moist 2X2 and foam dressing.   Change foam dressing evert 3 days or as needed for soiling.   01/23/23 1328            Discharge Exam: Filed Weights   01/20/23 0500 01/21/23 0500 01/22/23 0500  Weight: 87.6 kg 87.9 kg 89.1 kg   General exam: awake, alert, no acute distress HEENT: atraumatic, clear conjunctiva, anicteric sclera, moist mucus membranes, hearing grossly normal  Respiratory system: CTAB, no wheezes, rales or rhonchi, normal respiratory effort. Cardiovascular system: normal S1/S2,  RRR, no JVD, murmurs, rubs, gallops,  improved  LE edema (trace).   Gastrointestinal system: soft, NT, ND, no HSM felt, +bowel sounds. Central nervous system: A&O x3. no gross focal neurologic deficits, normal speech Extremities: moves all , no edema, normal tone Skin: dry, intact, normal temperature, normal color, No rashes, lesions or ulcers Psychiatry: normal mood, congruent affect, judgement and insight appear normal   Condition at discharge: stable  The results of significant diagnostics from this hospitalization (including imaging, microbiology, ancillary and laboratory) are listed below for reference.   Imaging Studies: US RENAL  Result Date: 01/20/2023 CLINICAL DATA:  Acute kidney injury EXAM: RENAL / URINARY TRACT ULTRASOUND COMPLETE COMPARISON:  CT 01/11/2023. FINDINGS: Right Kidney: Renal measurements: 10.2 x 4.3 x 4.6 cm = volume: 106 mL. There is a minimally complex exophytic cyst in the right kidney measuring up to 8.5 cm, as seen on recent CT. No dedicated follow-up imaging is recommended. No hydronephrosis. Mildly increased cortical echogenicity. Left Kidney: Renal measurements: 9.9 x 5.1 x 4.4 cm = volume: 116 mL. No hydronephrosis. Mildly increased cortical echogenicity. Bladder: Appears normal for degree of bladder distention. Other: None. IMPRESSION: No hydronephrosis. Mildly increased renal  cortical echogenicity, as can be seen in medical renal disease. Electronically Signed   By: Maurine Simmering M.D.   On: 01/20/2023 17:02   DG Foot 2 Views Left  Result Date: 01/16/2023 CLINICAL DATA:  Left heel ulcer.  Evaluate for osteomyelitis.  Pain. EXAM: LEFT FOOT - 2 VIEW COMPARISON:  None available FINDINGS: There is diffuse decreased bone mineralization. Mild-to-moderate hallux valgus. Mild great toe metatarsophalangeal joint space narrowing. Small plantar calcaneal heel spur. Minimal chronic enthesopathic change at the Achilles insertion on the calcaneus. Mild dorsal talonavicular degenerative osteophytosis. Mild superficial irregularity of the posterior plantar heel soft tissues. No cortical erosion is visualized. No subcutaneous air. IMPRESSION: 1. Mild irregularity of the posterior plantar heel superficial soft tissues. No radiographic evidence of osteomyelitis. 2. Mild-to-moderate hallux valgus. Mild great toe metatarsophalangeal joint osteoarthritis. Electronically Signed   By: Yvonne Kendall M.D.   On: 01/16/2023 11:30   ECHOCARDIOGRAM COMPLETE  Result Date: 01/13/2023    ECHOCARDIOGRAM REPORT   Patient Name:   Tracey Bryant Date of Exam: 01/12/2023 Medical Rec #:  [id number]       Height:       66.0 in Accession #:    [id number]      Weight:       187.0 lb Date of Birth:  01/14/1951       BSA:          1.943 m Patient Age:    75 years        BP:           143/88 mmHg Patient Gender: F               HR:           77 bpm. Exam Location:  [facility] Procedure: 2D Echo, Cardiac Doppler, Color Doppler and Intracardiac            Opacification Agent Indications:     Syncope  History:         Patient has prior history of Echocardiogram examinations, most                  recent 05/14/2022. CHF, Stroke, Arrythmias:Atrial Fibrillation,                  Signs/Symptoms:Dizziness/Lightheadedness and Syncope; Risk  Factors:Hypertension and Diabetes.  Sonographer:     Wenda Low Referring Phys:   [id number] SARA-MAIZ A THOMAS Diagnosing Phys: [place]  1. Left ventricular ejection fraction, by estimation, is 40 to 45%. The left ventricle has mildly decreased function. The left ventricle demonstrates global hypokinesis. The left ventricular internal cavity size was mildly to moderately dilated. Left ventricular diastolic parameters are consistent with Grade II diastolic dysfunction (pseudonormalization).  2. Right ventricular systolic function is normal. The right ventricular size is moderately enlarged. There is moderately elevated pulmonary artery systolic pressure.  3. Left atrial size was severely dilated.  4. Right atrial size was moderately dilated.  5. The mitral valve is normal in structure. Mild mitral valve regurgitation. No evidence of mitral stenosis.  6. Tricuspid valve regurgitation is moderate.  7. The aortic valve is calcified. Aortic valve regurgitation is trivial. Aortic valve sclerosis is present, with no evidence of aortic valve stenosis.  8. The inferior vena cava is normal in size with greater than 50% respiratory variability, suggesting right atrial pressure of 3 mmHg. FINDINGS  Left Ventricle: Left ventricular ejection fraction, by estimation, is 40 to 45%. The left ventricle has mildly decreased function. The left ventricle demonstrates global hypokinesis. Definity contrast agent was given IV to delineate the left ventricular  endocardial borders. The left ventricular internal cavity size was mildly to moderately dilated. There is no concentric left ventricular hypertrophy. Left ventricular diastolic parameters are consistent with Grade II diastolic dysfunction (pseudonormalization). Right Ventricle: The right ventricular size is moderately enlarged. No increase in right ventricular wall thickness. Right ventricular systolic function is normal. There is moderately elevated pulmonary artery systolic pressure. The tricuspid regurgitant  velocity is 3.30 m/s, and with an  assumed right atrial pressure of 8 mmHg, the estimated right ventricular systolic pressure is 58.0 mmHg. Left Atrium: Left atrial size was severely dilated. Right Atrium: Right atrial size was moderately dilated. Pericardium: There is no evidence of pericardial effusion. Mitral Valve: The mitral valve is normal in structure. Mild mitral valve regurgitation. No evidence of mitral valve stenosis. MV peak gradient, 5.6 mmHg. The mean mitral valve gradient is 2.0 mmHg. Tricuspid Valve: The tricuspid valve is normal in structure. Tricuspid valve regurgitation is moderate . No evidence of tricuspid stenosis. Aortic Valve: The aortic valve is calcified. Aortic valve regurgitation is trivial. Aortic valve sclerosis is present, with no evidence of aortic valve stenosis. Aortic valve mean gradient measures 2.0 mmHg. Aortic valve peak gradient measures 2.8 mmHg. Aortic valve area, by VTI measures 3.06 cm. Pulmonic Valve: The pulmonic valve was normal in structure. Pulmonic valve regurgitation is trivial. No evidence of pulmonic stenosis. Aorta: The aortic root is normal in size and structure. Venous: The inferior vena cava is normal in size with greater than 50% respiratory variability, suggesting right atrial pressure of 3 mmHg. IAS/Shunts: No atrial level shunt detected by color flow Doppler.  LEFT VENTRICLE PLAX 2D LVIDd:         4.90 cm   Diastology LVIDs:         3.90 cm   LV e' medial:    6.20 cm/s LV PW:         1.10 cm   LV E/e' medial:  17.7 LV IVS:        1.20 cm   LV e' lateral:   8.59 cm/s LVOT diam:     2.00 cm   LV E/e' lateral: 12.8 LV SV:         50 LV  SV Index:   26 LVOT Area:     3.14 cm  RIGHT VENTRICLE RV Basal diam:  3.55 cm RV Mid diam:    4.10 cm LEFT ATRIUM             Index        RIGHT ATRIUM           Index LA diam:        4.70 cm 2.42 cm/m   RA Area:     18.10 cm LA Vol (A2C):   63.6 ml 32.72 ml/m  RA Volume:   44.70 ml  23.00 ml/m LA Vol (A4C):   83.5 ml 42.96 ml/m LA Biplane Vol: 76.1 ml  39.16 ml/m  AORTIC VALVE                    PULMONIC VALVE AV Area (Vmax):    3.16 cm     PV Vmax:       0.73 m/s AV Area (Vmean):   2.94 cm     PV Peak grad:  2.1 mmHg AV Area (VTI):     3.06 cm AV Vmax:           83.40 cm/s AV Vmean:          57.500 cm/s AV VTI:            0.163 m AV Peak Grad:      2.8 mmHg AV Mean Grad:      2.0 mmHg LVOT Vmax:         83.80 cm/s LVOT Vmean:        53.800 cm/s LVOT VTI:          0.159 m LVOT/AV VTI ratio: 0.98  AORTA Ao Root diam: 2.90 cm MITRAL VALVE                TRICUSPID VALVE MV Area (PHT): 5.42 cm     TR Peak grad:   43.6 mmHg MV Area VTI:   2.04 cm     TR Vmax:        330.00 cm/s MV Peak grad:  5.6 mmHg MV Mean grad:  2.0 mmHg     SHUNTS MV Vmax:       1.18 m/s     Systemic VTI:  0.16 m MV Vmean:      65.7 cm/s    Systemic Diam: 2.00 cm MV Decel Time: 140 msec MV E velocity: 110.00 cm/s MV A velocity: 48.70 cm/s MV E/A ratio:  2.26 Shaukat Khan Electronically signed by Neoma Laming Signature Date/Time: 01/13/2023/11:44:44 AM    Final    US Venous Img Lower Bilateral (DVT)  Result Date: 01/12/2023 CLINICAL DATA:  72 year old female a history lower extremity swelling EXAM: BILATERAL LOWER EXTREMITY VENOUS DOPPLER ULTRASOUND TECHNIQUE: Gray-scale sonography with graded compression, as well as color Doppler and duplex ultrasound were performed to evaluate the lower extremity deep venous systems from the level of the common femoral vein and including the common femoral, femoral, profunda femoral, popliteal and calf veins including the posterior tibial, peroneal and gastrocnemius veins when visible. The superficial great saphenous vein was also interrogated. Spectral Doppler was utilized to evaluate flow at rest and with distal augmentation maneuvers in the common femoral, femoral and popliteal veins. COMPARISON:  None Available. FINDINGS: RIGHT LOWER EXTREMITY Common Femoral Vein: No evidence of thrombus. Normal compressibility, respiratory phasicity and response to  augmentation. Saphenofemoral Junction: No evidence of thrombus. Normal compressibility and flow on color Doppler  imaging. Profunda Femoral Vein: No evidence of thrombus. Normal compressibility and flow on color Doppler imaging. Femoral Vein: No evidence of thrombus. Normal compressibility, respiratory phasicity and response to augmentation. Popliteal Vein: No evidence of thrombus. Normal compressibility, respiratory phasicity and response to augmentation. Calf Veins: No evidence of thrombus. Normal compressibility and flow on color Doppler imaging. Superficial Great Saphenous Vein: No evidence of thrombus. Normal compressibility and flow on color Doppler imaging. Other Findings:  Edema LEFT LOWER EXTREMITY Common Femoral Vein: No evidence of thrombus. Normal compressibility, respiratory phasicity and response to augmentation. Saphenofemoral Junction: No evidence of thrombus. Normal compressibility and flow on color Doppler imaging. Profunda Femoral Vein: No evidence of thrombus. Normal compressibility and flow on color Doppler imaging. Femoral Vein: No evidence of thrombus. Normal compressibility, respiratory phasicity and response to augmentation. Popliteal Vein: No evidence of thrombus. Normal compressibility, respiratory phasicity and response to augmentation. Calf Veins: No evidence of thrombus. Normal compressibility and flow on color Doppler imaging. Superficial Great Saphenous Vein: No evidence of thrombus. Normal compressibility and flow on color Doppler imaging. Other Findings:  Edema IMPRESSION: Directed duplex of the bilateral lower extremity negative for DVT Edema Signed, Dulcy Fanny. Nadene Rubins, RPVI [facility] Rad[facility] Electronically Signed   By: Corrie Mckusick D.O.   On: 01/12/2023 12:44   DG Chest Port 1 View  Result Date: 01/12/2023 CLINICAL DATA:  Syncope EXAM: PORTABLE CHEST 1 VIEW COMPARISON:  07/17/2022 FINDINGS: Left pacer remains in  place, unchanged. Cardiomegaly. Interstitial prominence likely reflects early interstitial edema. Small right pleural effusion. No acute bony abnormality. IMPRESSION: Cardiomegaly, probable early interstitial edema. Small right pleural effusion. Electronically Signed   By: Rolm Baptise M.D.   On: 01/12/2023 01:09   CT ABDOMEN PELVIS WO CONTRAST  Result Date: 01/11/2023 CLINICAL DATA:  Abdominal pain, acute, nonlocalized EXAM: CT ABDOMEN AND PELVIS WITHOUT CONTRAST TECHNIQUE: Multidetector CT imaging of the abdomen and pelvis was performed following the standard protocol without IV contrast. RADIATION DOSE REDUCTION: This exam was performed according to the departmental dose-optimization program which includes automated exposure control, adjustment of the mA and/or kV according to patient size and/or use of iterative reconstruction technique. COMPARISON:  CT abdomen pelvis 12/14/2022, temp pelvis 01/17/2013 FINDINGS: Lower chest: Bilateral trace pleural effusions with pleural thickening on the right. Partially visualize cardiac leads. Hepatobiliary: Mild hepatomegaly measuring up to 19 cm. No focal liver abnormality. Contracted gallbladder with percutaneous cholecystostomy pigtail terminating likely within the gallbladder lumen. No biliary dilatation. Pancreas: No focal lesion. Normal pancreatic contour. No surrounding inflammatory changes. No main pancreatic ductal dilatation. Spleen: Normal in size without focal abnormality. Adrenals/Urinary Tract: No adrenal nodule bilaterally. No nephrolithiasis and no hydronephrosis. 7 cm fluid density lesion within the right kidney with thin peripheral calcifications likely representing a minimally complex cyst-no further follow-up indicated. No ureterolithiasis or hydroureter. The urinary bladder is unremarkable. Stomach/Bowel: Stomach is within normal limits. No evidence of bowel wall thickening or dilatation. Colonic diverticulosis. Appendix appears normal.  Vascular/Lymphatic: No abdominal aorta or iliac aneurysm. Moderate atherosclerotic plaque of the aorta and its branches. No abdominal, pelvic, or inguinal lymphadenopathy. Reproductive: Uterus and bilateral adnexa are unremarkable. Other: Trace volume simple free fluid. No intraperitoneal free gas. No organized fluid collection. Musculoskeletal: Diffuse subcutaneus soft tissue edema. Persistent well-defined 2 cm left hip subcutaneus soft tissue density. Tiny fat containing umbilical hernia. No suspicious lytic or blastic osseous lesions. No acute displaced fracture. Total left hip arthroplasty partially visualized. IMPRESSION: 1. Bilateral trace pleural effusions  with pleural thickening on the right. Right empyema not excluded. 2. Contracted gallbladder with percutaneous cholecystostomy pigtail terminating likely within the gallbladder lumen. 3. Trace simple free fluid ascites. 4. Colonic diverticulosis with no acute diverticulitis. 5. Mild hepatomegaly. 6. Persistent well-defined 2 cm left hip subcutaneus soft tissue density. Query seroma. 7.  Aortic Atherosclerosis (ICD10-I70.0). Electronically Signed   By: Iven Finn M.D.   On: 01/11/2023 22:22   CT HEAD WO CONTRAST  Result Date: 01/11/2023 CLINICAL DATA:  Syncope/presyncope, cerebrovascular cause suspected syncope EXAM: CT HEAD WITHOUT CONTRAST TECHNIQUE: Contiguous axial images were obtained from the base of the skull through the vertex without intravenous contrast. RADIATION DOSE REDUCTION: This exam was performed according to the departmental dose-optimization program which includes automated exposure control, adjustment of the mA and/or kV according to patient size and/or use of iterative reconstruction technique. COMPARISON:  CT head 07/13/2022. FINDINGS: Brain: No evidence of acute infarction, hemorrhage, hydrocephalus, extra-axial collection or mass lesion/mass effect. Similar chronic infarcts in the right frontal, parietal, posterior temporal and  occipital lobes. Small remote left cerebellar infarct. Vascular: No hyperdense vessel. Skull: No acute fracture. Sinuses/Orbits: Largely clear sinuses.  No acute orbital findings. Other: No mastoid effusions. IMPRESSION: 1. No evidence of acute intracranial abnormality. 2. Chronic infarcts. Electronically Signed   By: Margaretha Sheffield M.D.   On: 01/11/2023 16:41    Microbiology: Results for orders placed or performed during the hospital encounter of 01/11/23  Urine Culture     Status: Abnormal   Collection Time: 01/11/23 10:10 PM   Specimen: Urine, Clean Catch  Result Value Ref Range Status   Specimen Description   Final    URINE, CLEAN CATCH Performed at [facility], [address] [place], [place] [address]    Special Requests   Final    NONE Performed at [facility], [address] [place], [place] [address]    Culture >=100,000 COLONIES/mL ESCHERICHIA COLI (A)  Final   Report Status 01/14/2023 FINAL  Final   Organism ID, Bacteria ESCHERICHIA COLI (A)  Final      Susceptibility   Escherichia coli - MIC*    AMPICILLIN >=32 RESISTANT Resistant     CEFAZOLIN <=4 SENSITIVE Sensitive     CEFEPIME <=0.12 SENSITIVE Sensitive     CEFTRIAXONE <=0.25 SENSITIVE Sensitive     CIPROFLOXACIN <=0.25 SENSITIVE Sensitive     GENTAMICIN <=1 SENSITIVE Sensitive     IMIPENEM <=0.25 SENSITIVE Sensitive     NITROFURANTOIN <=16 SENSITIVE Sensitive     TRIMETH/SULFA <=20 SENSITIVE Sensitive     AMPICILLIN/SULBACTAM 16 INTERMEDIATE Intermediate     PIP/TAZO <=4 SENSITIVE Sensitive     * >=100,000 COLONIES/mL ESCHERICHIA COLI    Labs: CBC: Recent Labs  Lab 01/21/23 0443 01/21/23 1356 01/22/23 0436 01/22/23 1605 01/23/23 0425 01/23/23 1302  WBC 8.7  --  8.3  --  7.0  --   HGB 7.0* 6.9* 7.2* 7.5* 7.1* 7.5*  HCT 22.7* 22.1* 22.7* 24.1* 22.2* 24.1*  MCV 97.4  --  97.0  --  98.2  --   PLT 267  --  276  --  264  --    Basic Metabolic Panel: Recent Labs  Lab  01/18/23 0857 01/20/23 0515 01/21/23 0443 01/22/23 0436 01/23/23 0425  NA 137 134* 134* 135 135  K 4.5 4.9 4.8 5.1 4.8  CL 107 107 108 108 109  CO2 24 20* 20* 22 20*  GLUCOSE 103* 183* 152* 105* 68*  BUN 71* 76* 72*  72* 71*  CREATININE 2.59* 2.60* 2.48* 2.40* 2.30*  CALCIUM 7.8* 7.5* 7.6* 7.8* 7.9*  MG  --  2.5*  --   --   --   PHOS  --  4.1  --   --   --    Liver Function Tests: No results for input(s): "AST", "ALT", "ALKPHOS", "BILITOT", "PROT", "ALBUMIN" in the last 168 hours. CBG: Recent Labs  Lab 01/22/23 1606 01/22/23 2104 01/23/23 0550 01/23/23 0827 01/23/23 1216  GLUCAP 214* 210* 90 135* 122*    Discharge time spent: less than 30 minutes.  Signed: Ezekiel Slocumb, DO [facility] 01/24/2023

## 2023-01-23 NOTE — TOC Transition Note (Addendum)
Transition of Care [facility]) - CM/SW Discharge Note   Patient Details  Name: Tracey Bryant MRN: [id number] Date of Birth: 01-09-1951  [facility] CM/SW Contact:  Harriet Masson, RN Phone Number:[phone] 01/23/2023, 1:35 PM   Clinical Narrative:    Spoke with pt concerning discharge disposition as pt indicates she will be going to her sister's home. Spoke with sister Lorelee New and confirmed [facility], [place], [place] [address]. Pt open to any EMS provider. Only transport services accepted was [facility] [phone]. EMS indicates pt next on list for transportation. No other needs or recommended DME at this time.  Notified Mortimer Fries with [facility] [phone] with the above new location for services as this may have to be transferred to another agency for ongoing services.  No additional needs for TOC to address at this time.  2:20pm  Confirmed with sister Lorelee New pharmacy would be [facility] on [address] in [place] for pt's medications [phone]. Team aware.   Final next level of care: [place] Barriers to Discharge: Barriers Resolved   Patient Goals and CMS Choice   Choice offered to / list presented to : NA  Discharge Placement                    Name of family member notified: Lorelee New [phone] Patient and family notified of of transfer: 01/23/23  Discharge Plan and Services Additional resources added to the After Visit Summary for       Post Acute Care Choice: Resumption of Svcs/PTA Provider                               Social Determinants of Health (SDOH) Interventions SDOH Screenings   Food Insecurity: No Food Insecurity (01/12/2023)  Housing: Low Risk  (01/12/2023)  Transportation Needs: No Transportation Needs (01/12/2023)  Utilities: Not At Risk (01/12/2023)  Tobacco Use: Low Risk  (01/12/2023)     Readmission Risk Interventions    01/13/2023   11:57 AM 12/27/2021    2:32 PM  03/19/2021    9:34 AM  Readmission Risk Prevention Plan  Transportation Screening Complete Complete   PCP or Specialist Appt within 5-7 Days   Complete  PCP or Specialist Appt within 3-5 Days  Complete   Home Care Screening   Complete  [place] or [place]  Complete   Social Work Consult for [place] Planning/Counseling  Complete   Palliative Care Screening  Not Applicable   Medication Review Press photographer) Complete Complete   PCP or Specialist appointment within 3-5 days of discharge Complete    HRI or [place] Complete    SW Recovery Care/Counseling Consult Complete    Palliative Care Screening Not [place] Patient Refused

## 2023-01-27 ENCOUNTER — Telehealth: Payer: Self-pay

## 2023-01-27 NOTE — Telephone Encounter (Signed)
(  3:59 pm) PC SW scheduled an initial palliative care visit with patient's sister. Patient to see SW/Nurse team on 02/02/23 @ 11:30 am.

## 2023-02-02 ENCOUNTER — Other Ambulatory Visit: Payer: Medicare HMO

## 2023-02-02 VITALS — BP 140/80 | HR 85 | Temp 97.4°F

## 2023-02-02 DIAGNOSIS — Z515 Encounter for palliative care: Secondary | ICD-10-CM

## 2023-02-02 NOTE — Progress Notes (Signed)
PATIENT NAME: Tracey Bryant DOB: 15-Oct-1951 MRN: [id number]  PRIMARY CARE PROVIDER: Coolidge Breeze, FNP  RESPONSIBLE PARTY:  Acct ID - Guarantor Home Phone Work Phone Relationship Acct Type  [id number] CORLISS, [phone]  Self P/F     [address] [place], [place] [address]   Palliative Care Initial Encounter Note   Completed visit w/Monica Lonon, SW.  Freida and Cathy - CG (sisters) also present.      Appetite: 3 meals and snacks on occasion   Mobility: needs assist to stand; uses walker once standing; trained sister on good body mechanics when using a gait belt  GI: daily BM without difficulty; does not take stool softeners since she rarely takes opioids  Wound Care: L heel; sister does wound care in the home  Respiratory:  some SOB with exertion; knows what to do to return to normal breathing; doesn't feel O2 is needed at this time   Sleeping Pattern: sleeps throughout the night except when L heel hurts  Pain: no pain at this time but her L heel has 6/10 pain level at it's worse       CODE STATUS: Full Code ADVANCED DIRECTIVES: N MOST FORM: N PPS: 40%   PHYSICAL EXAM:   VITALS: Today's Vitals   02/02/23 1200  BP: (!) 140/80  Pulse: 85  Temp: (!) 97.4 F (36.3 C)  SpO2: 99%  PainSc: 0-No pain    LUNGS: clear to auscultation  CARDIAC: Cor irreg, irreg RRR EXTREMITIES: 1+ pitting edema to BLE SKIN: Skin color, texture, turgor normal. No rashes or lesions  NEURO: negative      Yandel Zeiner Georgann Housekeeper, LPN

## 2023-02-02 NOTE — Progress Notes (Signed)
COMMUNITY PALLIATIVE CARE SW NOTE  PATIENT NAME: Tracey Bryant DOB: 04/08/1951 MRN: [id number]  PRIMARY CARE PROVIDER: Coolidge Breeze, FNP  RESPONSIBLE PARTY:  Acct ID - Guarantor Home Phone Work Phone Relationship Acct Type  [id number] DANUTA, [phone]  Self P/F     [address], [place], [place] [address]   Initial Clinical Social Work Visit  PC SW and LPN-D. Georgann Housekeeper completed an initial visit with patient at her home. Patient was sitting on the couch with her feet elevated. She was alert and oriented x3. She was provided education regarding palliative care services. Patient signed consent advising that she is agreement with palliative care services.   Patient is alert and oriented x3, but is forgetful. Patient ambulates with walker. Patient receiving physical and occupational therapy 1x week/each and RN through [facility]. She report that she is eating better. She does have an open wound on both feet that is very tender. She also has edema in her feet. She has SOB with activity. Patient is mostly independent for ADL's, but sister assist assist as needed as her gait is unsteady. She report that her appetite is good where she is eating 3 meals per day and snacks.    SOCIAL HX:  Patient is divorced and have 2 children. Patient's son Maud Deed is her HCPOA. 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    Social History   Tobacco Use   Smoking status: Never   Smokeless tobacco: Never  Substance Use Topics   Alcohol use: No    CODE STATUS: Full Code ADVANCED DIRECTIVES: No MOST FORM COMPLETE:  No HOSPICE EDUCATION PROVIDED: No  Duration of visit and documentation: 60 minutes  Rasheena Talmadge, LCSW

## 2023-03-07 ENCOUNTER — Telehealth: Payer: Self-pay

## 2023-03-07 NOTE — Telephone Encounter (Signed)
Spoke to [facility] who reports that patient is improving slowly and needs constant supervision. LPN scheduled a Palliative Care visit on 3.20.24 at [place] is aware to call LPN if there are any changes.

## 2023-03-21 ENCOUNTER — Telehealth: Payer: Self-pay

## 2023-03-21 NOTE — Telephone Encounter (Signed)
3:44pm - Returned the call to [facility] but no answer. Left a message with directions to call PCP or if emergent call 911.  Lorelee Market, LPN [facility] Palliative Nurse [phone]

## 2023-03-21 NOTE — Telephone Encounter (Signed)
345 pm.  Message received from sister Tye Maryland requesting a call back urgently due to change in patient.  Tye Maryland advised patient is difficult to arouse. The most response she is able to get is a mumble.  Blood sugars are 176 and 173 this afternoon.  Blood pressures 175/95 and 175/97.  Instructed to call 911 for evaluation due to change in patient condition.   Update provided to [facility], LPN.

## 2023-03-23 ENCOUNTER — Other Ambulatory Visit: Payer: Medicare HMO

## 2023-03-23 VITALS — BP 138/80 | HR 88 | Temp 96.4°F | Wt 197.0 lb

## 2023-03-23 DIAGNOSIS — Z515 Encounter for palliative care: Secondary | ICD-10-CM

## 2023-03-23 NOTE — Progress Notes (Signed)
PATIENT NAME: Tracey Bryant DOB: [birth year] MRN: [id number]  PRIMARY CARE PROVIDER: Coolidge Breeze, FNP  RESPONSIBLE PARTY:  Acct ID - Guarantor Home Phone Work Phone Relationship Acct Type  [id number] SHEQUETTA, [phone]  Self P/F     [address] [place], [place] [address]    Palliative Care Follow Up Encounter Note   Completed home visit. Sisters and nephew also present.     Appetite: 3 meals and snacks on occasion; she tries to have a protein shake every day   Mobility: needs assist to stand; uses walker once standing; uses good body mechanics when using a gait belt as per pt demonstration   GI/GU: daily BM without difficulty; continent of urine with some incontinent episodes; sometimes she is unable to get up quickly enough to make it to the bathroom   Wound Care: Nurse Leafy Ro comes to the home to dress L heel wound; sometimes family changes dressing   Respiratory:  some SOB with exertion; knows what to do to return to normal breathing; doesn't feel O2 is needed at this time   Sleeping Pattern: sleeps throughout the night except when L heel hurts   Pain:  has 6/10 pain level today due to arthritis   Diabetes; she makes sure to eat a snack before bed to keep blood sugar levels from plummeting; pt had an episode of increased blood sugar and increased blood pressure within the past week; she was taken to the ED, treated and then sent home     CODE STATUS: Full Code ADVANCED DIRECTIVES: N MOST FORM: N PPS: 40%   PHYSICAL EXAM:   VITALS: Today's Vitals   03/23/23 1150  BP: 138/80  Pulse: 88  Temp: (!) 96.4 F (35.8 C)  TempSrc: Temporal  SpO2: 97%  Weight: 197 lb (89.4 kg)  PainSc: 6   PainLoc: Arm    LUNGS: clear to auscultation  CARDIAC: Cor RRR EXTREMITIES: BLE edema SKIN: cool, pale, edema 1+ pitting NEURO: negative except for gait problems   Next scheduled appt is April 06, 2023 at [place], LPN

## 2023-04-06 ENCOUNTER — Other Ambulatory Visit: Payer: Medicare HMO

## 2023-04-06 DIAGNOSIS — Z515 Encounter for palliative care: Secondary | ICD-10-CM

## 2023-04-06 NOTE — Progress Notes (Signed)
PATIENT NAME: Tracey Bryant DOB: 08/14/1951 MRN: [id number]  PRIMARY CARE PROVIDER: Wilmon Pali, FNP  RESPONSIBLE PARTY:  Acct ID - Guarantor Home Phone Work Phone Relationship Acct Type  [id number] DEZTINEE, AIKEN* [phone]  Self P/F     [address] [place], [place] [address]    Palliative Care Telephone Encounter Note   I connected with Tracey Bryant regarding Tracey Bryant on 04/06/23 by telephone and verified that I am speaking with the correct person using two identifiers.         TODAY'S VISIT:  Cognitive: alert and oriented   Appetite: no changes  GI/GU: has a bile bag that has been painful at the insertion site; bag is change every 8 weeks; has had a vomiting episode within the past week   Mobility: released by PT on 04/05/23; able to get around the house on her own with the walker  Sleeping Pattern: stays awake at night longer than she has been in the past  Pain: bile bag hurts but not today  Diabetes: blood sugars have been elevated   Palliative Care/ Hospice: LPN explained role and purpose of palliative care including visit frequency. Also discussed benefits of hospice care as well as the differences between the two with patient.   Goals of Care: To stay in the home with her sisters    Next Appt Scheduled For: 04/20/23 at 12:30pm   CODE STATUS: Full Code ADVANCED DIRECTIVES: N MOST FORM: N PPS: 40%   PHYSICAL EXAM:   VITALS:There were no vitals filed for this visit.      Consuello Masse, RN

## 2023-04-20 ENCOUNTER — Other Ambulatory Visit: Payer: Medicare HMO

## 2023-04-20 DIAGNOSIS — Z515 Encounter for palliative care: Secondary | ICD-10-CM

## 2023-06-01 NOTE — Progress Notes (Signed)
Went to patient's home and patient was  at a rehab facility as per family.

## 2023-06-23 ENCOUNTER — Ambulatory Visit: Payer: Medicare HMO | Admitting: Cardiovascular Disease

## 2023-09-02 NOTE — Progress Notes (Deleted)
No show

## 2023-09-05 ENCOUNTER — Encounter: Payer: Self-pay | Admitting: Cardiovascular Disease

## 2023-09-05 ENCOUNTER — Ambulatory Visit: Payer: Medicare HMO | Attending: Cardiovascular Disease | Admitting: Cardiovascular Disease

## 2023-09-05 DIAGNOSIS — Z950 Presence of cardiac pacemaker: Secondary | ICD-10-CM

## 2023-09-05 DIAGNOSIS — I480 Paroxysmal atrial fibrillation: Secondary | ICD-10-CM

## 2023-09-05 DIAGNOSIS — I495 Sick sinus syndrome: Secondary | ICD-10-CM

## 2023-09-05 DIAGNOSIS — I10 Essential (primary) hypertension: Secondary | ICD-10-CM

## 2023-09-05 DIAGNOSIS — I5022 Chronic systolic (congestive) heart failure: Secondary | ICD-10-CM

## 2024-04-26 DEATH — deceased
